# Patient Record
Sex: Male | Born: 1970 | Race: White | Hispanic: No | Marital: Married | Smoking: Former smoker
Health system: Southern US, Community
[De-identification: ages and names within clinical notes are randomized; demographics above are authoritative.]

## PROBLEM LIST (undated history)

## (undated) DIAGNOSIS — K219 Gastro-esophageal reflux disease without esophagitis: Secondary | ICD-10-CM

## (undated) DIAGNOSIS — T783XXA Angioneurotic edema, initial encounter: Secondary | ICD-10-CM

## (undated) DIAGNOSIS — M5030 Other cervical disc degeneration, unspecified cervical region: Secondary | ICD-10-CM

## (undated) DIAGNOSIS — K589 Irritable bowel syndrome without diarrhea: Secondary | ICD-10-CM

## (undated) DIAGNOSIS — J40 Bronchitis, not specified as acute or chronic: Secondary | ICD-10-CM

## (undated) DIAGNOSIS — N200 Calculus of kidney: Secondary | ICD-10-CM

## (undated) DIAGNOSIS — J302 Other seasonal allergic rhinitis: Secondary | ICD-10-CM

## (undated) DIAGNOSIS — I517 Cardiomegaly: Secondary | ICD-10-CM

## (undated) DIAGNOSIS — M4800 Spinal stenosis, site unspecified: Secondary | ICD-10-CM

## (undated) DIAGNOSIS — I10 Essential (primary) hypertension: Secondary | ICD-10-CM

## (undated) DIAGNOSIS — I219 Acute myocardial infarction, unspecified: Secondary | ICD-10-CM

## (undated) DIAGNOSIS — K760 Fatty (change of) liver, not elsewhere classified: Secondary | ICD-10-CM

## (undated) DIAGNOSIS — M109 Gout, unspecified: Secondary | ICD-10-CM

## (undated) DIAGNOSIS — E119 Type 2 diabetes mellitus without complications: Secondary | ICD-10-CM

## (undated) HISTORY — DX: Calculus of kidney: N20.0

## (undated) HISTORY — DX: Acute myocardial infarction, unspecified: I21.9

## (undated) HISTORY — DX: Other seasonal allergic rhinitis: J30.2

## (undated) HISTORY — DX: Bronchitis, not specified as acute or chronic: J40

## (undated) HISTORY — DX: Gastro-esophageal reflux disease without esophagitis: K21.9

## (undated) HISTORY — PX: OTHER SURGICAL HISTORY: SHX169

## (undated) HISTORY — DX: Cardiomegaly: I51.7

## (undated) HISTORY — DX: Essential (primary) hypertension: I10

## (undated) HISTORY — DX: Spinal stenosis, site unspecified: M48.00

## (undated) HISTORY — DX: Type 2 diabetes mellitus without complications: E11.9

## (undated) HISTORY — DX: Other cervical disc degeneration, unspecified cervical region: M50.30

## (undated) HISTORY — PX: VASECTOMY: SHX75

## (undated) HISTORY — DX: Irritable bowel syndrome, unspecified: K58.9

## (undated) HISTORY — DX: Fatty (change of) liver, not elsewhere classified: K76.0

## (undated) HISTORY — DX: Gout, unspecified: M10.9

## (undated) HISTORY — DX: Angioneurotic edema, initial encounter: T78.3XXA

---

## 2002-12-26 HISTORY — PX: NASAL SEPTUM SURGERY: SHX37

## 2005-12-26 HISTORY — PX: SHOULDER SURGERY: SHX246

## 2005-12-26 HISTORY — PX: OTHER SURGICAL HISTORY: SHX169

## 2007-07-12 ENCOUNTER — Ambulatory Visit: Payer: Self-pay | Admitting: Orthopaedic Surgery

## 2007-08-20 ENCOUNTER — Ambulatory Visit: Payer: Self-pay | Admitting: Orthopaedic Surgery

## 2007-08-20 ENCOUNTER — Other Ambulatory Visit: Payer: Self-pay

## 2007-08-23 ENCOUNTER — Ambulatory Visit: Payer: Self-pay | Admitting: Orthopaedic Surgery

## 2008-07-17 ENCOUNTER — Emergency Department: Payer: Self-pay | Admitting: Emergency Medicine

## 2008-07-17 ENCOUNTER — Other Ambulatory Visit: Payer: Self-pay

## 2009-05-10 ENCOUNTER — Ambulatory Visit: Payer: Self-pay | Admitting: Internal Medicine

## 2010-10-13 ENCOUNTER — Ambulatory Visit: Payer: Self-pay | Admitting: Family Medicine

## 2011-02-06 ENCOUNTER — Ambulatory Visit: Payer: Self-pay | Admitting: Internal Medicine

## 2014-08-04 ENCOUNTER — Ambulatory Visit: Payer: Self-pay | Admitting: Unknown Physician Specialty

## 2015-05-28 ENCOUNTER — Ambulatory Visit (INDEPENDENT_AMBULATORY_CARE_PROVIDER_SITE_OTHER): Payer: Managed Care, Other (non HMO) | Admitting: Gastroenterology

## 2015-05-28 ENCOUNTER — Encounter: Payer: Self-pay | Admitting: Gastroenterology

## 2015-05-28 ENCOUNTER — Ambulatory Visit: Payer: Self-pay | Admitting: Gastroenterology

## 2015-05-28 VITALS — BP 140/90 | HR 54 | Temp 98.4°F | Ht 71.0 in | Wt 240.0 lb

## 2015-05-28 DIAGNOSIS — M109 Gout, unspecified: Secondary | ICD-10-CM | POA: Insufficient documentation

## 2015-05-28 DIAGNOSIS — I10 Essential (primary) hypertension: Secondary | ICD-10-CM | POA: Diagnosis not present

## 2015-05-28 DIAGNOSIS — K589 Irritable bowel syndrome without diarrhea: Secondary | ICD-10-CM | POA: Insufficient documentation

## 2015-05-28 MED ORDER — PB-HYOSCY-ATROPINE-SCOPOLAMINE 16.2 MG/5ML PO ELIX: 5.0000 mL | ORAL_SOLUTION | Freq: Four times a day (QID) | ORAL | Status: DC | PRN

## 2015-05-28 NOTE — Addendum Note (Signed)
Addended by: Lucilla Lame on: 05/28/2015 11:13 AM   Modules accepted: Orders

## 2015-05-28 NOTE — Progress Notes (Signed)
Primary Care Physician:  Enid Derry, MD  Primary Gastroenterologist:  Dr. Lucilla Lame  Chief Complaint  Patient presents with  . Irritable Bowel Syndrome  . Abdominal Pain    HPI:  John Hartman is a 44 y.o. male here today with a history of irritable bowel syndrome since he is a young child.  The patient has approximately 4 episodes of pain with vomiting only recently as a associated symptom with his diarrhea.  The patient is usually constipated and only has the diarrhea when he has these explosive diarrhea episodes.  The patient had a colonoscopy probably 5 years ago and reports a mother with what sounds like to be colon polyps.  She is undergoing frequent colon Screening for colon polyps.  The patient has taken hyoscyamine and has had little relief from it.  The patient will be started on a trial of Donnatal to be taken when he has these attacks.  The patient has also been encouraged to start a daily stool softener such as MiraLAX or prune juice.  Due to his family history of colon polyps and his change in symptoms with resulting vomiting and diarrhea coming together.  The patient will be set up for a colonoscopy. Current Outpatient Prescriptions  Medication Sig Dispense Refill  . atenolol (TENORMIN) 50 MG tablet Take 50 mg by mouth daily.    . cetirizine (ZYRTEC) 10 MG tablet Take 10 mg by mouth daily.    . diphenhydrAMINE (BENADRYL) 25 mg capsule Take 25 mg by mouth 2 (two) times daily.    . ranitidine (ZANTAC) 150 MG capsule Take 150 mg by mouth daily.     No current facility-administered medications for this visit.    Allergies as of 05/28/2015 - Review Complete 05/28/2015  Allergen Reaction Noted  . Allopurinol  05/28/2015    Past Medical History  Diagnosis Date  . Hypertension   . Gout     Past Surgical History  Procedure Laterality Date  . Shoulder surgery Left 2007  . Nasal septum surgery  2004  . Acdf with fusion  2007  . Testicular torsion      Family History   Problem Relation Age of Onset  . Breast cancer Mother   . Skin cancer Mother     History   Social History  . Marital Status: Married    Spouse Name: N/A  . Number of Children: N/A  . Years of Education: N/A   Occupational History  . Not on file.   Social History Main Topics  . Smoking status: Former Smoker    Quit date: 04/15/2015  . Smokeless tobacco: Never Used  . Alcohol Use: 0.0 oz/week    0 Standard drinks or equivalent per week     Comment: Rare consumption  . Drug Use: No  . Sexual Activity: Not on file   Other Topics Concern  . Not on file   Social History Narrative  . No narrative on file      ROS:  General: Negative for anorexia, weight loss, fever, chills, fatigue, weakness. Eyes: Negative for vision changes.  ENT: Negative for hoarseness, difficulty swallowing , nasal congestion. CV: Negative for chest pain, angina, palpitations, dyspnea on exertion, peripheral edema.  Respiratory: Negative for dyspnea at rest, dyspnea on exertion, cough, sputum, wheezing.  GI: See history of present illness. GU:  Negative for dysuria, hematuria, urinary incontinence, urinary frequency, nocturnal urination.  MS: Negative for joint pain, low back pain.  Derm: Negative for rash or itching.  Neuro:  Negative for weakness, abnormal sensation, seizure, frequent headaches, memory loss, confusion.  Psych: Negative for anxiety, depression, suicidal ideation, hallucinations.  Endo: Negative for unusual weight change.  Heme: Negative for bruising or bleeding. Allergy: Negative for rash or hives.    Physical Examination:  BP 140/90 mmHg  Pulse 54  Temp(Src) 98.4 F (36.9 C) (Oral)  Ht 5\' 11"  (1.803 m)  Wt 240 lb (108.863 kg)  BMI 33.49 kg/m2   General: Well-nourished, well-developed in no acute distress.  Head: Normocephalic, atraumatic.   Eyes: Conjunctiva pink, no icterus. EOMI. Mouth: Oropharyngeal mucosa moist and pink, no lesions erythema or exudate. Neck:  Supple without thyromegaly, masses, or lymphadenopathy.  Lungs: Clear to auscultation bilaterally.  Heart: Regular rate and rhythm, no murmurs rubs or gallops.  Abdomen: Bowel sounds are normal, soft, nontender, nondistended, no hepatosplenomegaly or masses, no abdominal bruits or hernia, no rebound tenderness or guarding.  Rectal: Deferred. Extremities: No lower extremity edema. No clubbing or deformities.  Neuro: Alert and oriented x 3, grossly normal neurologically.  Skin: Warm and dry, no rash or jaundice.   Psych: Cooperative, normal mood and affect.  Imaging Studies: No results found.

## 2015-06-25 ENCOUNTER — Ambulatory Visit (INDEPENDENT_AMBULATORY_CARE_PROVIDER_SITE_OTHER): Payer: Managed Care, Other (non HMO) | Admitting: Cardiovascular Disease

## 2015-06-25 ENCOUNTER — Other Ambulatory Visit: Payer: Self-pay

## 2015-06-25 ENCOUNTER — Encounter: Payer: Self-pay | Admitting: Cardiovascular Disease

## 2015-06-25 VITALS — BP 138/102 | HR 61 | Ht 72.0 in | Wt 241.8 lb

## 2015-06-25 DIAGNOSIS — R5383 Other fatigue: Secondary | ICD-10-CM | POA: Diagnosis not present

## 2015-06-25 DIAGNOSIS — I4901 Ventricular fibrillation: Secondary | ICD-10-CM

## 2015-06-25 DIAGNOSIS — I498 Other specified cardiac arrhythmias: Secondary | ICD-10-CM

## 2015-06-25 DIAGNOSIS — R000 Tachycardia, unspecified: Secondary | ICD-10-CM

## 2015-06-25 NOTE — Progress Notes (Signed)
Cardiology Office Note   Date:  06/25/2015   ID:  John Hartman, DOB 1971-08-09, MRN [id number]  PCP:  Enid Derry, MD  Cardiologist:   Thayer Headings, MD   Chief Complaint  Patient presents with  . other    Ref by Dr. Sanda Klein for tachy/bradycardia. Pt. c/o rapid heart beats, breaking out into sweats. Meds reviewed by the patient verbally.    Problem List  1. tachypalpitations 2. Essential hypertension 3. Irritable Bowel syndrome      History of Present Illness: John Hartman is a 44 y.o. male who presents for further evaluation of episodes of bradycardia and tachycardia .  He has been measuring his HR on his phone .  His HR ranges from the 40's to 110 .  He used to do regular exercise but has not done any in 4 weeks because of these episodes.  He has mowed the lawn without any problems .  These episodes of tachycardia typically occur when he is at rest  .  He thinks the HR may be irregular but its difficult to tell . Mother has atrial fib  Has IBS but these episodes are not related to the episodes of faster HR   No associated CP.  No dyspnea.  Does feel the need to take a deep breath  has some severe episodes which are associated with lightheadedness and fatigue afterwards      Past Medical History  Diagnosis Date  . Hypertension   . Gout   . IBS (irritable bowel syndrome)   . HTN (hypertension)   . GERD (gastroesophageal reflux disease)   . Angioedema   . Seasonal allergies     Past Surgical History  Procedure Laterality Date  . Shoulder surgery Left 2007  . Nasal septum surgery  2004  . Acdf with fusion  2007  . Testicular torsion       Current Outpatient Prescriptions  Medication Sig Dispense Refill  . atenolol (TENORMIN) 50 MG tablet Take 50 mg by mouth daily.    . belladonna-PHENObarbital (DONNATAL) 16.2 MG/5ML ELIX Take 5 mLs (16.2 mg total) by mouth 4 (four) times daily as needed for cramping or pain. 600 mL 1  . cetirizine (ZYRTEC) 10 MG tablet Take  10 mg by mouth daily.    . diphenhydrAMINE (BENADRYL) 25 mg capsule Take 25 mg by mouth 2 (two) times daily.    . febuxostat (ULORIC) 40 MG tablet Take 40 mg by mouth daily.    . indomethacin (INDOCIN) 25 MG capsule Take 25 mg by mouth 2 (two) times daily with a meal.    . loratadine (CLARITIN) 10 MG tablet Take 10 mg by mouth daily.    . Melatonin 5 MG TABS Take 5 mg by mouth daily.    . ranitidine (ZANTAC) 150 MG capsule Take 150 mg by mouth daily.     No current facility-administered medications for this visit.    Allergies:   Allopurinol    Social History:  The patient  reports that he quit smoking about 2 months ago. He has never used smokeless tobacco. He reports that he drinks alcohol. He reports that he does not use illicit drugs.   Family History:  The patient's family history includes Arrhythmia in his mother; Breast cancer in his mother; Skin cancer in his mother.    ROS:  Please see the history of present illness.    Review of Systems: Constitutional:  denies fever, chills, diaphoresis, appetite change and fatigue.  HEENT:  denies photophobia, eye pain, redness, hearing loss, ear pain, congestion, sore throat, rhinorrhea, sneezing, neck pain, neck stiffness and tinnitus.  Respiratory: denies SOB, DOE, cough, chest tightness, and wheezing.  Cardiovascular: denies chest pain, palpitations and leg swelling.  Gastrointestinal: denies nausea, vomiting, abdominal pain, diarrhea, constipation, blood in stool.  Genitourinary: denies dysuria, urgency, frequency, hematuria, flank pain and difficulty urinating.  Musculoskeletal: denies  myalgias, back pain, joint swelling, arthralgias and gait problem.   Skin: denies pallor, rash and wound.  Neurological: denies dizziness, seizures, syncope, weakness, light-headedness, numbness and headaches.   Hematological: denies adenopathy, easy bruising, personal or family bleeding history.  Psychiatric/ Behavioral: denies suicidal ideation,  mood changes, confusion, nervousness, sleep disturbance and agitation.       All other systems are reviewed and negative.    PHYSICAL EXAM: VS:  BP 138/102 mmHg  Pulse 61  Ht 6' (1.829 m)  Wt 109.657 kg (241 lb 12 oz)  BMI 32.78 kg/m2 , BMI Body mass index is 32.78 kg/(m^2). GEN: Well nourished, well developed, in no acute distress HEENT: normal Neck: no JVD, carotid bruits, or masses Cardiac: RRR; no murmurs, rubs, or gallops,no edema  Respiratory:  clear to auscultation bilaterally, normal work of breathing GI: soft, nontender, nondistended, + BS MS: no deformity or atrophy Skin: warm and dry, no rash Neuro:  Strength and sensation are intact Psych: normal   EKG:  EKG is ordered today. The ekg ordered today demonstrates :  NSR at 61.  No ST or T wave changes    Recent Labs: No results found for requested labs within last 365 days.    Lipid Panel No results found for: CHOL, TRIG, HDL, CHOLHDL, VLDL, LDLCALC, LDLDIRECT    Wt Readings from Last 3 Encounters:  06/25/15 109.657 kg (241 lb 12 oz)  05/28/15 108.863 kg (240 lb)      Other studies Reviewed: Additional studies/ records that were reviewed today include: . Review of the above records demonstrates:    ASSESSMENT AND PLAN:  1.  Tachypalpatations: He presents with episodes of tachycardia palpitations lasting anywhere from several minutes to several hours. His heart rate typically goes into the 1:30 to 110 range. He has episodes that are not very severe and then at other times he has episodes that caused him to have some sweats, lightheadedness and caused him to be very fatigued. Clinically, I'm concerned that he may be having episodes of paroxysmal atrial fibrillation. His mother has a history of atrial fibrillation as well.  We'll set him up with a 30 day event monitor. He does not drink much caffeine. I have encouraged him to exercise on a regular basis. I've advised him to make sure he stays well-hydrated  and gets plenty of protein to eat.   Will see him in 6 weeks  - sooner if needed   2. Hypertension: The patient has a history of hypertension. His blood pressures typically well controlled but it is a little higher today. He's been nothing by mouth for the past day or so in preparation for colonoscopy tomorrow. I suspect this am his hypertension is related to this as well as some anxiety about coming into the cardiologist. Burnis Medin address this at a later time if needed.  Current medicines are reviewed at length with the patient today.  The patient does not have concerns regarding medicines.  The following changes have been made:  no change  Labs/ tests ordered today include:  Orders Placed This Encounter  Procedures  . EKG  12-Lead     Disposition:   FU with me in 6 weeks      Aisha Greenberger, Wonda Cheng, MD  06/25/2015 2:06 PM    [facility] [place], [place], [place]  [address] Phone: [phone]; Fax: [phone]   [facility]  [address] [place], [place]  [address] [phone]    Fax [phone]

## 2015-06-25 NOTE — Patient Instructions (Addendum)
Medication Instructions: - no changes  Labwork: - no changes  Procedures/Testing: - Your physician has recommended that you wear an event monitor. Event monitors are medical devices that record the heart's electrical activity. Doctors most often [place] these monitors to diagnose arrhythmias. Arrhythmias are problems with the speed or rhythm of the heartbeat. The monitor is a small, portable device. You can wear one while you do your normal daily activities. This is usually used to diagnose what is causing palpitations/syncope (passing out).  ** This will be mailed to your home from the company **  Follow-Up: - Your physician recommends that you schedule a follow-up appointment in: 6 weeks with Dr. Acie Fredrickson.  Any Additional Special Instructions Will Be Listed Below (If Applicable).

## 2015-06-26 ENCOUNTER — Ambulatory Visit: Payer: Managed Care, Other (non HMO) | Admitting: Anesthesiology

## 2015-06-26 ENCOUNTER — Encounter
Admission: RE | Disposition: A | Discharge status: Home / Self Care | Payer: Self-pay | Source: Ambulatory Visit | Attending: Gastroenterology

## 2015-06-26 ENCOUNTER — Other Ambulatory Visit: Payer: Self-pay | Admitting: Gastroenterology

## 2015-06-26 ENCOUNTER — Ambulatory Visit
Admission: RE | Admit: 2015-06-26 | Discharge: 2015-06-26 | Disposition: A | Discharge status: Home / Self Care | Payer: Managed Care, Other (non HMO) | Source: Ambulatory Visit | Attending: Gastroenterology | Admitting: Gastroenterology

## 2015-06-26 DIAGNOSIS — Z808 Family history of malignant neoplasm of other organs or systems: Secondary | ICD-10-CM | POA: Insufficient documentation

## 2015-06-26 DIAGNOSIS — D127 Benign neoplasm of rectosigmoid junction: Secondary | ICD-10-CM | POA: Insufficient documentation

## 2015-06-26 DIAGNOSIS — K640 First degree hemorrhoids: Secondary | ICD-10-CM | POA: Diagnosis not present

## 2015-06-26 DIAGNOSIS — Z8371 Family history of colonic polyps: Secondary | ICD-10-CM | POA: Diagnosis not present

## 2015-06-26 DIAGNOSIS — M109 Gout, unspecified: Secondary | ICD-10-CM | POA: Diagnosis not present

## 2015-06-26 DIAGNOSIS — Z79899 Other long term (current) drug therapy: Secondary | ICD-10-CM | POA: Insufficient documentation

## 2015-06-26 DIAGNOSIS — K219 Gastro-esophageal reflux disease without esophagitis: Secondary | ICD-10-CM | POA: Insufficient documentation

## 2015-06-26 DIAGNOSIS — Z9079 Acquired absence of other genital organ(s): Secondary | ICD-10-CM | POA: Insufficient documentation

## 2015-06-26 DIAGNOSIS — I10 Essential (primary) hypertension: Secondary | ICD-10-CM | POA: Diagnosis not present

## 2015-06-26 DIAGNOSIS — Z803 Family history of malignant neoplasm of breast: Secondary | ICD-10-CM | POA: Diagnosis not present

## 2015-06-26 DIAGNOSIS — Z800 Family history of malignant neoplasm of digestive organs: Secondary | ICD-10-CM | POA: Insufficient documentation

## 2015-06-26 DIAGNOSIS — K589 Irritable bowel syndrome without diarrhea: Secondary | ICD-10-CM | POA: Diagnosis not present

## 2015-06-26 DIAGNOSIS — Z888 Allergy status to other drugs, medicaments and biological substances status: Secondary | ICD-10-CM | POA: Insufficient documentation

## 2015-06-26 DIAGNOSIS — Z87891 Personal history of nicotine dependence: Secondary | ICD-10-CM | POA: Diagnosis not present

## 2015-06-26 DIAGNOSIS — Z8249 Family history of ischemic heart disease and other diseases of the circulatory system: Secondary | ICD-10-CM | POA: Insufficient documentation

## 2015-06-26 HISTORY — PX: COLONOSCOPY WITH PROPOFOL: SHX5780

## 2015-06-26 SURGERY — COLONOSCOPY WITH PROPOFOL
Anesthesia: Monitor Anesthesia Care | Wound class: Contaminated

## 2015-06-26 MED ORDER — PROPOFOL 10 MG/ML IV BOLUS
INTRAVENOUS | Status: DC | PRN
  Administered 2015-06-26 (×2): 50 mg via INTRAVENOUS
  Administered 2015-06-26: 100 mg via INTRAVENOUS

## 2015-06-26 MED ORDER — LIDOCAINE HCL (CARDIAC) 20 MG/ML IV SOLN
INTRAVENOUS | Status: DC | PRN
  Administered 2015-06-26: 40 mg via INTRAVENOUS

## 2015-06-26 MED ORDER — LACTATED RINGERS IV SOLN
INTRAVENOUS | Status: DC
  Administered 2015-06-26: 14:00:00 via INTRAVENOUS

## 2015-06-26 MED ORDER — SODIUM CHLORIDE 0.9 % IV SOLN: INTRAVENOUS | Status: DC

## 2015-06-26 MED ORDER — ACETAMINOPHEN 325 MG PO TABS: 325.0000 mg | ORAL_TABLET | ORAL | Status: DC | PRN

## 2015-06-26 MED ORDER — ACETAMINOPHEN 160 MG/5ML PO SOLN: 325.0000 mg | ORAL | Status: DC | PRN

## 2015-06-26 SURGICAL SUPPLY — 28 items
CANISTER SUCT 1200ML W/VALVE (MISCELLANEOUS) ×2 IMPLANT
FCP ESCP3.2XJMB 240X2.8X (MISCELLANEOUS)
FORCEPS BIOP RAD 4 LRG CAP 4 (CUTTING FORCEPS) ×2 IMPLANT
FORCEPS BIOP RJ4 240 W/NDL (MISCELLANEOUS)
FORCEPS ESCP3.2XJMB 240X2.8X (MISCELLANEOUS) IMPLANT
GOWN CVR UNV OPN BCK APRN NK (MISCELLANEOUS) ×2 IMPLANT
GOWN ISOL THUMB LOOP REG UNIV (MISCELLANEOUS) ×2
HEMOCLIP INSTINCT (CLIP) IMPLANT
INJECTOR VARIJECT VIN23 (MISCELLANEOUS) IMPLANT
KIT CO2 TUBING (TUBING) IMPLANT
KIT DEFENDO VALVE AND CONN (KITS) IMPLANT
KIT ENDO PROCEDURE OLY (KITS) ×2 IMPLANT
LIGATOR MULTIBAND 6SHOOTER MBL (MISCELLANEOUS) IMPLANT
MARKER SPOT ENDO TATTOO 5ML (MISCELLANEOUS) IMPLANT
PAD GROUND ADULT SPLIT (MISCELLANEOUS) IMPLANT
SNARE SHORT THROW 13M SML OVAL (MISCELLANEOUS) IMPLANT
SNARE SHORT THROW 30M LRG OVAL (MISCELLANEOUS) IMPLANT
SPOT EX ENDOSCOPIC TATTOO (MISCELLANEOUS)
SUCTION POLY TRAP 4CHAMBER (MISCELLANEOUS) IMPLANT
TRAP SUCTION POLY (MISCELLANEOUS) IMPLANT
TUBING CONN 6MMX3.1M (TUBING)
TUBING SUCTION CONN 0.25 STRL (TUBING) IMPLANT
UNDERPAD 30X60 958B10 (PK) (MISCELLANEOUS) IMPLANT
VALVE BIOPSY ENDO (VALVE) IMPLANT
VARIJECT INJECTOR VIN23 (MISCELLANEOUS)
WATER AUXILLARY (MISCELLANEOUS) IMPLANT
WATER STERILE IRR 250ML POUR (IV SOLUTION) ×2 IMPLANT
WATER STERILE IRR 500ML POUR (IV SOLUTION) IMPLANT

## 2015-06-26 NOTE — H&P (Signed)
[facility]  [address] [place], [place] [address] Phone: [phone] Fax : [phone]  Primary Care Physician:  Enid Derry, MD Primary Gastroenterologist:  Dr. Allen Norris  Pre-Procedure History & Physical: HPI:  John Hartman is a 44 y.o. male is here for an colonoscopy.   Past Medical History  Diagnosis Date  . Gout   . IBS (irritable bowel syndrome)   . Angioedema   . Seasonal allergies   . Hypertension     CONTROLLED ON MEDS  . HTN (hypertension)   . GERD (gastroesophageal reflux disease)     Past Surgical History  Procedure Laterality Date  . Shoulder surgery Left 2007  . Nasal septum surgery  2004  . Acdf with fusion  2007  . Testicular torsion    . Vasectomy      Prior to Admission medications   Medication Sig Start Date End Date Taking? Authorizing Provider  atenolol (TENORMIN) 50 MG tablet Take 50 mg by mouth daily. PM   Yes Historical Provider, MD  cetirizine (ZYRTEC) 10 MG tablet Take 10 mg by mouth daily.   Yes Historical Provider, MD  diphenhydrAMINE (BENADRYL) 25 mg capsule Take 25 mg by mouth 2 (two) times daily.   Yes Historical Provider, MD  febuxostat (ULORIC) 40 MG tablet Take 40 mg by mouth daily.   Yes Historical Provider, MD  indomethacin (INDOCIN) 25 MG capsule Take 25 mg by mouth 2 (two) times daily with a meal.   Yes Historical Provider, MD  Melatonin 5 MG TABS Take 5 mg by mouth at bedtime.    Yes Historical Provider, MD  ranitidine (ZANTAC) 150 MG capsule Take 150 mg by mouth daily.   Yes Historical Provider, MD  belladonna-PHENObarbital (DONNATAL) 16.2 MG/5ML ELIX Take 5 mLs (16.2 mg total) by mouth 4 (four) times daily as needed for cramping or pain. Patient not taking: Reported on 06/26/2015 05/28/15   Lucilla Lame, MD  loratadine (CLARITIN) 10 MG tablet Take 10 mg by mouth daily.    Historical Provider, MD    Allergies as of 06/25/2015 - Review Complete 06/25/2015  Allergen Reaction Noted  . Allopurinol  05/28/2015     Family History  Problem Relation Age of Onset  . Breast cancer Mother   . Skin cancer Mother   . Arrhythmia Mother     A-fib    History   Social History  . Marital Status: Married    Spouse Name: N/A  . Number of Children: N/A  . Years of Education: N/A   Occupational History  . Not on file.   Social History Main Topics  . Smoking status: Former Smoker -- 1.00 packs/day for 10 years    Types: Cigarettes    Quit date: 04/15/2015  . Smokeless tobacco: Never Used  . Alcohol Use: No     Comment: Rare consumption  . Drug Use: No  . Sexual Activity: Not on file   Other Topics Concern  . Not on file   Social History Narrative    Review of Systems: See HPI, otherwise negative ROS  Physical Exam: BP 144/89 mmHg  Pulse 67  Temp(Src) 99.3 F (37.4 C)  Resp 18  Ht 6' (1.829 m)  Wt 235 lb (106.595 kg)  BMI 31.86 kg/m2 General:   Alert,  pleasant and cooperative in NAD Head:  Normocephalic and atraumatic. Neck:  Supple; no masses or thyromegaly. Lungs:  Clear throughout to auscultation.    Heart:  Regular rate and rhythm. Abdomen:  Soft,  nontender and nondistended. Normal bowel sounds, without guarding, and without rebound.   Neurologic:  Alert and  oriented x4;  grossly normal neurologically.  Impression/Plan: Deckard Stuber is here for an colonoscopy to be performed for change in bowel habit and family history of colon polyps.  Risks, benefits, limitations, and alternatives regarding  colonoscopy have been reviewed with the patient.  Questions have been answered.  All parties agreeable.   Ollen Bowl, MD  06/26/2015, 2:08 PM

## 2015-06-26 NOTE — Discharge Instructions (Signed)

## 2015-06-26 NOTE — Anesthesia Procedure Notes (Signed)
Procedure Name: MAC Performed by: Nysia Dell Pre-anesthesia Checklist: Patient identified, Emergency Drugs available, Suction available, Timeout performed and Patient being monitored Patient Re-evaluated:Patient Re-evaluated prior to inductionOxygen Delivery Method: Nasal cannula Placement Confirmation: positive ETCO2     

## 2015-06-26 NOTE — Anesthesia Preprocedure Evaluation (Signed)
Anesthesia Evaluation  Patient identified by MRN, date of birth, ID band  Reviewed: Allergy & Precautions, H&P , NPO status , Patient's Chart, lab work & pertinent test results  Airway Mallampati: II  TM Distance: >3 FB Neck ROM: full    Dental no notable dental hx.    Pulmonary former smoker,    Pulmonary exam normal        Cardiovascular hypertension,  Rhythm:regular Rate:Normal     Neuro/Psych    GI/Hepatic GERD  ,  Endo/Other    Renal/GU      Musculoskeletal   Abdominal   Peds  Hematology   Anesthesia Other Findings   Reproductive/Obstetrics                             Anesthesia Physical Anesthesia Plan  ASA: II  Anesthesia Plan: MAC   Post-op Pain Management:    Induction:   Airway Management Planned:   Additional Equipment:   Intra-op Plan:   Post-operative Plan:   Informed Consent: I have reviewed the patients History and Physical, chart, labs and discussed the procedure including the risks, benefits and alternatives for the proposed anesthesia with the patient or authorized representative who has indicated his/her understanding and acceptance.     Plan Discussed with: CRNA  Anesthesia Plan Comments:         Anesthesia Quick Evaluation  

## 2015-06-26 NOTE — Transfer of Care (Signed)
Immediate Anesthesia Transfer of Care Note  Patient: John Hartman  Procedure(s) Performed: Procedure(s) with comments: COLONOSCOPY WITH PROPOFOL (N/A) - with biopsies  Patient Location: PACU  Anesthesia Type: MAC  Level of Consciousness: awake, alert  and patient cooperative  Airway and Oxygen Therapy: Patient Spontanous Breathing and Patient connected to supplemental oxygen  Post-op Assessment: Post-op Vital signs reviewed, Patient's Cardiovascular Status Stable, Respiratory Function Stable, Patent Airway and No signs of Nausea or vomiting  Post-op Vital Signs: Reviewed and stable  Complications: No apparent anesthesia complications

## 2015-06-26 NOTE — Anesthesia Postprocedure Evaluation (Signed)
  Anesthesia Post-op Note  Patient: John Hartman  Procedure(s) Performed: Procedure(s) with comments: COLONOSCOPY WITH PROPOFOL (N/A) - with biopsies  Anesthesia type:MAC  Patient location: PACU  Post pain: Pain level controlled  Post assessment: Post-op Vital signs reviewed, Patient's Cardiovascular Status Stable, Respiratory Function Stable, Patent Airway and No signs of Nausea or vomiting  Post vital signs: Reviewed and stable  Last Vitals:  Filed Vitals:   06/26/15 1440  BP: 110/74  Pulse: 75  Temp:   Resp: 22    Level of consciousness: awake, alert  and patient cooperative  Complications: No apparent anesthesia complications

## 2015-06-26 NOTE — Op Note (Signed)
[facility] Patient Name: John Hartman Procedure Date: 06/26/2015 2:04 PM MRN: [id number] Account #: [id number] Date of Birth: Jan 25, 1971 Admit Type: Outpatient Age: 44 Room: [facility] OR ROOM 01 Gender: Male Note Status: Finalized Procedure:         Colonoscopy Indications:       Family history of colon cancer in a first-degree relative Providers:         Lucilla Lame, MD Medicines:         Propofol per Anesthesia Complications:     No immediate complications. Procedure:         Pre-Anesthesia Assessment:                    - Prior to the procedure, a History and Physical was                     performed, and patient medications and allergies were                     reviewed. The patient's tolerance of previous anesthesia                     was also reviewed. The risks and benefits of the procedure                     and the sedation options and risks were discussed with the                     patient. All questions were answered, and informed consent                     was obtained. Prior Anticoagulants: The patient has taken                     no previous anticoagulant or antiplatelet agents. ASA                     Grade Assessment: II - A patient with mild systemic                     disease. After reviewing the risks and benefits, the                     patient was deemed in satisfactory condition to undergo                     the procedure.                    After obtaining informed consent, the colonoscope was                     passed under direct vision. Throughout the procedure, the                     patient's blood pressure, pulse, and oxygen saturations                     were monitored continuously. The Olympus CF-HQ190L                     Colonoscope (S#. [id number]) was introduced through the anus                     and advanced to the the  cecum, identified by appendiceal                     orifice and ileocecal valve. The  colonoscopy was performed                     without difficulty. The patient tolerated the procedure                     well. The quality of the bowel preparation was excellent. Findings:      The perianal and digital rectal examinations were normal.      Three sessile polyps were found in the recto-sigmoid colon. The polyps       were 2 to 4 mm in size. These polyps were removed with a cold biopsy       forceps. Resection and retrieval were complete.      Non-bleeding internal hemorrhoids were found during retroflexion. The       hemorrhoids were Grade I (internal hemorrhoids that do not prolapse). Impression:        - Three 2 to 4 mm polyps at the recto-sigmoid colon.                     Resected and retrieved.                    - Non-bleeding internal hemorrhoids. Recommendation:    - Repeat colonoscopy in 5 years for surveillance. Procedure Code(s): --- Professional ---                    [phone], Colonoscopy, flexible; with biopsy, single or                     multiple Diagnosis Code(s): --- Professional ---                    Z80.0, Family history of malignant neoplasm of digestive                     organs                    D12.7, Benign neoplasm of rectosigmoid junction CPT copyright 2014 American Medical Association. All rights reserved. The codes documented in this report are preliminary and upon coder review may  be revised to meet current compliance requirements. Lucilla Lame, MD 06/26/2015 2:24:40 PM This report has been signed electronically. Number of Addenda: 0 Note Initiated On: 06/26/2015 2:04 PM Scope Withdrawal Time: 0 hours 7 minutes 21 seconds  Total Procedure Duration: 0 hours 8 minutes 41 seconds       [facility]

## 2015-06-30 ENCOUNTER — Encounter: Payer: Self-pay | Admitting: Gastroenterology

## 2015-07-01 DIAGNOSIS — D127 Benign neoplasm of rectosigmoid junction: Secondary | ICD-10-CM | POA: Insufficient documentation

## 2015-07-01 DIAGNOSIS — Z800 Family history of malignant neoplasm of digestive organs: Secondary | ICD-10-CM | POA: Insufficient documentation

## 2015-07-02 ENCOUNTER — Encounter: Payer: Self-pay | Admitting: Gastroenterology

## 2015-07-02 ENCOUNTER — Ambulatory Visit (INDEPENDENT_AMBULATORY_CARE_PROVIDER_SITE_OTHER): Payer: Managed Care, Other (non HMO)

## 2015-07-02 DIAGNOSIS — I4901 Ventricular fibrillation: Secondary | ICD-10-CM

## 2015-07-02 DIAGNOSIS — R000 Tachycardia, unspecified: Secondary | ICD-10-CM | POA: Diagnosis not present

## 2015-07-23 ENCOUNTER — Telehealth: Payer: Self-pay | Admitting: *Deleted

## 2015-07-23 NOTE — Telephone Encounter (Signed)
Pt wanted to make you aware that he has stopped wearing the monitor.  He does not want to send it back until he knows that you are ok w/ that.

## 2015-07-23 NOTE — Telephone Encounter (Signed)
Pt is asking since he broke out from the monitor leads that can he stop wearing it.  He only has one week left.  He also states that a Supplement that he was taking called herbalife  He did not mention it when he came to see [place] but ever since he's stopped that  he's  Had nothing has happened. He thinks it was this.   But not sure.  Please advise. He also stated he will not be wearing it for the next few days but will await our call.

## 2015-07-24 NOTE — Telephone Encounter (Signed)
Pt calling back to find out status of him stopping the holter monitor. Please call and advise

## 2015-07-24 NOTE — Telephone Encounter (Signed)
This encounter was created in error - please disregard.

## 2015-07-27 NOTE — Telephone Encounter (Signed)
Left message regarding Dr. Elmarie Shiley recommendations with CB number if any further questions.

## 2015-07-27 NOTE — Telephone Encounter (Signed)
OK to DC he monitor early since he is apparently allergic to the adhesive in the leads. I'm not sure what's in Herbalife but if the palpitations have resolved after he stopped it, I would agree with him that it probably was contributing .

## 2015-08-12 ENCOUNTER — Encounter: Payer: Self-pay | Admitting: Cardiovascular Disease

## 2015-08-12 ENCOUNTER — Ambulatory Visit (INDEPENDENT_AMBULATORY_CARE_PROVIDER_SITE_OTHER): Payer: Managed Care, Other (non HMO) | Admitting: Cardiovascular Disease

## 2015-08-12 VITALS — BP 116/80 | HR 67 | Ht 72.0 in | Wt 244.5 lb

## 2015-08-12 DIAGNOSIS — R002 Palpitations: Secondary | ICD-10-CM | POA: Diagnosis not present

## 2015-08-12 DIAGNOSIS — I10 Essential (primary) hypertension: Secondary | ICD-10-CM

## 2015-08-12 NOTE — Patient Instructions (Signed)
Medication Instructions: None  Labwork: None  Testing/Procedures: none  Follow-Up with Dr. Acie Fredrickson as needed.  Any Other Special Instructions Will Be Listed Below (If Applicable).

## 2015-08-12 NOTE — Progress Notes (Signed)
Cardiology Office Note   Date:  08/12/2015   ID:  John Hartman, DOB [birth year], MRN [id number]  PCP:  Enid Derry, MD  Cardiologist:   Thayer Headings, MD   Chief Complaint  Patient presents with  . other    F/u ecardio no complaints. Meds reviewed verbally with pt.   Problem List  1. tachypalpitations 2. Essential hypertension 3. Irritable Bowel syndrome      History of Present Illness: John Hartman is a 44 y.o. male who presents for further evaluation of episodes of bradycardia and tachycardia .  He has been measuring his HR on his phone .  His HR ranges from the 40's to 110 .  He used to do regular exercise but has not done any in 4 weeks because of these episodes.  He has mowed the lawn without any problems .  These episodes of tachycardia typically occur when he is at rest  .  He thinks the HR may be irregular but its difficult to tell . Mother has atrial fib  Has IBS but these episodes are not related to the episodes of faster HR   No associated CP.  No dyspnea.  Does feel the need to take a deep breath  has some severe episodes which are associated with lightheadedness and fatigue afterwards   August 12, 2015:  John Hartman presents today for follow-up of his recent palpitations. He had a 30 day event monitor which revealed no significant arrhythmias. He now thinks it was the [employer] shakes that were causing his palpitations. He has stopped going to [employer] and the palpitations have resolved.  He developed a severe rash with the electrodes.    Past Medical History  Diagnosis Date  . Gout   . IBS (irritable bowel syndrome)   . Angioedema   . Seasonal allergies   . Hypertension     CONTROLLED ON MEDS  . HTN (hypertension)   . GERD (gastroesophageal reflux disease)     Past Surgical History  Procedure Laterality Date  . Shoulder surgery Left 2007  . Nasal septum surgery  2004  . Acdf with fusion  2007  . Testicular torsion    . Vasectomy    . Colonoscopy  with propofol N/A 06/26/2015    Procedure: COLONOSCOPY WITH PROPOFOL;  Surgeon: Lucilla Lame, MD;  Location: [facility];  Service: Endoscopy;  Laterality: N/A;  with biopsies     Current Outpatient Prescriptions  Medication Sig Dispense Refill  . atenolol (TENORMIN) 50 MG tablet Take 50 mg by mouth daily. PM    . belladonna-PHENObarbital (DONNATAL) 16.2 MG/5ML ELIX Take 5 mLs (16.2 mg total) by mouth 4 (four) times daily as needed for cramping or pain. 600 mL 1  . cetirizine (ZYRTEC) 10 MG tablet Take 10 mg by mouth daily.    . diphenhydrAMINE (BENADRYL) 25 mg capsule Take 25 mg by mouth 2 (two) times daily.    . febuxostat (ULORIC) 40 MG tablet Take 40 mg by mouth daily.    . indomethacin (INDOCIN) 25 MG capsule Take 25 mg by mouth as needed.     . Melatonin 5 MG TABS Take 5 mg by mouth at bedtime.     . ranitidine (ZANTAC) 150 MG capsule Take 150 mg by mouth daily.     No current facility-administered medications for this visit.    Allergies:   Allopurinol    Social History:  The patient  reports that he quit smoking about 3 months ago. His smoking  use included Cigarettes. He has a 10 pack-year smoking history. He has never used smokeless tobacco. He reports that he does not drink alcohol or use illicit drugs.   Family History:  The patient's family history includes Arrhythmia in his mother; Breast cancer in his mother; Skin cancer in his mother.    ROS:  Please see the history of present illness.    Review of Systems: Constitutional:  denies fever, chills, diaphoresis, appetite change and fatigue.  HEENT: denies photophobia, eye pain, redness, hearing loss, ear pain, congestion, sore throat, rhinorrhea, sneezing, neck pain, neck stiffness and tinnitus.  Respiratory: denies SOB, DOE, cough, chest tightness, and wheezing.  Cardiovascular: denies chest pain, palpitations and leg swelling.  Gastrointestinal: denies nausea, vomiting, abdominal pain, diarrhea, constipation,  blood in stool.  Genitourinary: denies dysuria, urgency, frequency, hematuria, flank pain and difficulty urinating.  Musculoskeletal: denies  myalgias, back pain, joint swelling, arthralgias and gait problem.   Skin: denies pallor, rash and wound.  Neurological: denies dizziness, seizures, syncope, weakness, light-headedness, numbness and headaches.   Hematological: denies adenopathy, easy bruising, personal or family bleeding history.  Psychiatric/ Behavioral: denies suicidal ideation, mood changes, confusion, nervousness, sleep disturbance and agitation.       All other systems are reviewed and negative.    PHYSICAL EXAM: VS:  BP 116/80 mmHg  Pulse 67  Ht 6' (1.829 m)  Wt 110.904 kg (244 lb 8 oz)  BMI 33.15 kg/m2 , BMI Body mass index is 33.15 kg/(m^2). GEN: Well nourished, well developed, in no acute distress HEENT: normal Neck: no JVD, carotid bruits, or masses Cardiac: RRR; no murmurs, rubs, or gallops,no edema  Respiratory:  clear to auscultation bilaterally, normal work of breathing GI: soft, nontender, nondistended, + BS MS: no deformity or atrophy Skin: warm and dry, no rash Neuro:  Strength and sensation are intact Psych: normal   EKG:  EKG is ordered today. The ekg ordered today demonstrates :  NSR at 61.  No ST or T wave changes    Recent Labs: No results found for requested labs within last 365 days.    Lipid Panel No results found for: CHOL, TRIG, HDL, CHOLHDL, VLDL, LDLCALC, LDLDIRECT    Wt Readings from Last 3 Encounters:  08/12/15 110.904 kg (244 lb 8 oz)  06/26/15 106.595 kg (235 lb)  06/25/15 109.657 kg (241 lb 12 oz)      Other studies Reviewed: Additional studies/ records that were reviewed today include: . Review of the above records demonstrates:    ASSESSMENT AND PLAN:  1.  Tachypalpatations: He had no palpitations since I last saw him He stopped drinking the [employer] shakes and he is feeling better  No significant arrhythmias  seen on Event monitor  I've reassured him that these arrhythmias are probably benign premature ventricular contractions.  2. Hypertension: The patient has a history of hypertension.   BP is normal .    Current medicines are reviewed at length with the patient today.  The patient does not have concerns regarding medicines.  The following changes have been made:  no change  Labs/ tests ordered today include:  No orders of the defined types were placed in this encounter.     Disposition:   FU with his medical doctor.  He may see me as needed      Nastasia Kage, Wonda Cheng, MD  08/12/2015 3:22 PM    [facility] [address], [place], [place]  [address] Phone: [phone]; Fax: [phone]  [place]  [address] [place], [place]  [address] [phone]    Fax [phone]

## 2015-10-08 ENCOUNTER — Encounter: Payer: Self-pay | Admitting: Family Medicine

## 2015-10-08 ENCOUNTER — Ambulatory Visit (INDEPENDENT_AMBULATORY_CARE_PROVIDER_SITE_OTHER): Payer: Managed Care, Other (non HMO) | Admitting: Family Medicine

## 2015-10-08 VITALS — BP 144/95 | HR 69 | Temp 97.8°F | Ht 70.0 in | Wt 242.0 lb

## 2015-10-08 DIAGNOSIS — F558 Abuse of other non-psychoactive substances: Secondary | ICD-10-CM | POA: Diagnosis not present

## 2015-10-08 DIAGNOSIS — I10 Essential (primary) hypertension: Secondary | ICD-10-CM | POA: Insufficient documentation

## 2015-10-08 DIAGNOSIS — M109 Gout, unspecified: Secondary | ICD-10-CM | POA: Diagnosis not present

## 2015-10-08 DIAGNOSIS — J340 Abscess, furuncle and carbuncle of nose: Secondary | ICD-10-CM | POA: Diagnosis not present

## 2015-10-08 MED ORDER — AMOXICILLIN 500 MG PO CAPS: 500.0000 mg | ORAL_CAPSULE | Freq: Three times a day (TID) | ORAL | Status: DC

## 2015-10-08 NOTE — Assessment & Plan Note (Signed)
Not to goal; encouraged patient to try DASH guidelines; monitor BP and call if not controlled

## 2015-10-08 NOTE — Patient Instructions (Addendum)
Start the new antibiotics Use warm compresses over the right side of the nose/cheek for 15-20 minutes at a time, 3-4 times a day; use cloth or napkin between warmth and your skin to avoid thermal injury If the infection spreads, please see Dr. Kathyrn Sheriff or go to the emergency department right away; worsening infection would include fever, redness and swelling on the side of the face, swelling or redness around the eye, etc.  Your goal blood pressure is less than 140 mmHg on top and the bottom number less than 90 mmHg Try to follow the DASH guidelines (DASH stands for Dietary Approaches to Stop Hypertension) Try to limit the sodium in your diet.  Ideally, consume less than 1.5 grams (less than 1,500mg ) per day. Do not add salt when cooking or at the table.  Check the sodium amount on labels when shopping, and choose items lower in sodium when given a choice. Avoid or limit foods that already contain a lot of sodium. Eat a diet rich in fruits and vegetables and whole grains.  DASH Eating Plan DASH stands for "Dietary Approaches to Stop Hypertension." The DASH eating plan is a healthy eating plan that has been shown to reduce high blood pressure (hypertension). Additional health benefits may include reducing the risk of type 2 diabetes mellitus, heart disease, and stroke. The DASH eating plan may also help with weight loss. WHAT DO I NEED TO KNOW ABOUT THE DASH EATING PLAN? For the DASH eating plan, you will follow these general guidelines:  Choose foods with a percent daily value for sodium of less than 5% (as listed on the food label).  Use salt-free seasonings or herbs instead of table salt or sea salt.  Check with your health care provider or pharmacist before using salt substitutes.  Eat lower-sodium products, often labeled as "lower sodium" or "no salt added."  Eat fresh foods.  Eat more vegetables, fruits, and low-fat dairy products.  Choose whole grains. Look for the word "whole" as the  first word in the ingredient list.  Choose fish and skinless chicken or [place] more often than red meat. Limit fish, poultry, and meat to 6 oz (170 g) each day.  Limit sweets, desserts, sugars, and sugary drinks.  Choose heart-healthy fats.  Limit cheese to 1 oz (28 g) per day.  Eat more home-cooked food and less restaurant, buffet, and fast food.  Limit fried foods.  Cook foods using methods other than frying.  Limit canned vegetables. If you do use them, rinse them well to decrease the sodium.  When eating at a restaurant, ask that your food be prepared with less salt, or no salt if possible. WHAT FOODS CAN I EAT? Seek help from a dietitian for individual calorie needs. Grains Whole grain or whole wheat bread. Brown rice. Whole grain or whole wheat pasta. Quinoa, bulgur, and whole grain cereals. Low-sodium cereals. Corn or whole wheat flour tortillas. Whole grain cornbread. Whole grain crackers. Low-sodium crackers. Vegetables Fresh or frozen vegetables (raw, steamed, roasted, or grilled). Low-sodium or reduced-sodium tomato and vegetable juices. Low-sodium or reduced-sodium tomato sauce and paste. Low-sodium or reduced-sodium canned vegetables.  Fruits All fresh, canned (in natural juice), or frozen fruits. Meat and Other Protein Products Ground beef (85% or leaner), grass-fed beef, or beef trimmed of fat. Skinless chicken or [place]. Ground chicken or [place]. Pork trimmed of fat. All fish and seafood. Eggs. Dried beans, peas, or lentils. Unsalted nuts and seeds. Unsalted canned beans. Dairy Low-fat dairy products, such as skim  or 1% milk, 2% or reduced-fat cheeses, low-fat ricotta or cottage cheese, or plain low-fat yogurt. Low-sodium or reduced-sodium cheeses. Fats and Oils Tub margarines without trans fats. Light or reduced-fat mayonnaise and salad dressings (reduced sodium). Avocado. Safflower, olive, or canola oils. Natural peanut or almond butter. Other Unsalted popcorn and  pretzels. The items listed above may not be a complete list of recommended foods or beverages. Contact your dietitian for more options. WHAT FOODS ARE NOT RECOMMENDED? Grains White bread. White pasta. White rice. Refined cornbread. Bagels and croissants. Crackers that contain trans fat. Vegetables Creamed or fried vegetables. Vegetables in a cheese sauce. Regular canned vegetables. Regular canned tomato sauce and paste. Regular tomato and vegetable juices. Fruits Dried fruits. Canned fruit in light or heavy syrup. Fruit juice. Meat and Other Protein Products Fatty cuts of meat. Ribs, chicken wings, bacon, sausage, bologna, salami, chitterlings, fatback, hot dogs, bratwurst, and packaged luncheon meats. Salted nuts and seeds. Canned beans with salt. Dairy Whole or 2% milk, cream, half-and-half, and cream cheese. Whole-fat or sweetened yogurt. Full-fat cheeses or blue cheese. Nondairy creamers and whipped toppings. Processed cheese, cheese spreads, or cheese curds. Condiments Onion and garlic salt, seasoned salt, table salt, and sea salt. Canned and packaged gravies. Worcestershire sauce. Tartar sauce. Barbecue sauce. Teriyaki sauce. Soy sauce, including reduced sodium. Steak sauce. Fish sauce. Oyster sauce. Cocktail sauce. Horseradish. Ketchup and mustard. Meat flavorings and tenderizers. Bouillon cubes. Hot sauce. Tabasco sauce. Marinades. Taco seasonings. Relishes. Fats and Oils Butter, stick margarine, lard, shortening, ghee, and bacon fat. Coconut, palm kernel, or palm oils. Regular salad dressings. Other Pickles and olives. Salted popcorn and pretzels. The items listed above may not be a complete list of foods and beverages to avoid. Contact your dietitian for more information. WHERE CAN I FIND MORE INFORMATION? National Heart, Lung, and Blood Institute: travelstabloid.com   This information is not intended to replace advice given to you by your health care  provider. Make sure you discuss any questions you have with your health care provider.   Document Released: 12/01/2011 Document Revised: 01/02/2015 Document Reviewed: 10/16/2013 Elsevier Interactive Patient Education [employer].

## 2015-10-08 NOTE — Assessment & Plan Note (Signed)
Much improved per patient; uric acid down under 6 per last check (his report)

## 2015-10-08 NOTE — Progress Notes (Signed)
BP 144/95 mmHg  Pulse 69  Temp(Src) 97.8 F (36.6 C)  Ht 5\' 10"  (1.778 m)  Wt 242 lb (109.77 kg)  BMI 34.72 kg/m2  SpO2 99%   Subjective:    Patient ID: John Hartman, male    DOB: 07/26/1971, 44 y.o.   MRN: [id number]  HPI: John Hartman is a 44 y.o. male  Chief Complaint  Patient presents with  . Facial Pain    along his nose, states it feels like there is a bump/soreness inside of his nose x 2 days. Feels like it's swollen on the inside.   Patient is here for evaluation of a sore in his nose On Tuesday, he woke up with some soreness on the right side of the nose; sometimes he gets big zits; it doesn't feel superficial, it feels like it is swollen on the inside; hurts and it's really sore; never had this before; has had a handful of sinus infections in his life; has used nasal sprays for years, the afrin, and has been using that a lot lately; he has used prednisone in the past to "kick it"; he uses it all the time; soreness is 2 days ago; no fevers; feels fine otherwise; uncharacteristically tired though, not like him; going on for 3-4 days; really tired, but they have been moving; early bedtime; moved last week to a new home; settling in; sleeping okay; has a new set of bedroom furniture; new bed two years ago and this new furniture helps him sleep better No polyuria, no polydipsia; nothing excessive No blood in stool in urine No night sweats; no recent travel, no known recent tick or mosquito bites Has been using the afrin for 20 years and he's tried to kick it 6-7 times; the prednisone might help for a while No swollen glands in the neck, no sore throat; no ear symptoms; just a few headaches; little bit of fuzziness on the right eye  His blood pressure is usually well-controlled His gout is doing much better, uric acid   Relevant past medical, surgical, family and social history reviewed and updated as indicated. Interim medical history since our last visit reviewed. Allergies and  medications reviewed and updated.  Review of Systems  Per HPI unless specifically indicated above     Objective:    BP 144/95 mmHg  Pulse 69  Temp(Src) 97.8 F (36.6 C)  Ht 5\' 10"  (1.778 m)  Wt 242 lb (109.77 kg)  BMI 34.72 kg/m2  SpO2 99%  Wt Readings from Last 3 Encounters:  10/08/15 242 lb (109.77 kg)  05/12/15 235 lb (106.595 kg)  08/12/15 244 lb 8 oz (110.904 kg)    Physical Exam  Constitutional: He appears well-developed and well-nourished. No distress.  HENT:  Head: Normocephalic and atraumatic.  Right Ear: Hearing, tympanic membrane, external ear and ear canal normal.  Left Ear: Hearing, tympanic membrane, external ear and ear canal normal.  Nose: Mucosal edema present. No rhinorrhea.  On the upper outer wall of the right nostril, there is mild swelling pushing inward into the nasal passage suggestive of localized infection; no drainage; airway patent  Eyes: EOM are normal. Right eye exhibits no discharge. Left eye exhibits no discharge. Right conjunctiva is not injected. Left conjunctiva is not injected. No scleral icterus.  Symmetric creases, no swelling  Cardiovascular: Normal rate and regular rhythm.   Pulmonary/Chest: Effort normal and breath sounds normal.  Lymphadenopathy:    He has no cervical adenopathy.  Skin: He is not diaphoretic.  Very mild erythema on the outside of the nose, right side; no extension of erythema long the cheek or up toward the eye  Psychiatric: He has a normal mood and affect.   No results found for this or any previous visit.    Assessment & Plan:   Problem List Items Addressed This Visit      Cardiovascular and Mediastinum   Essential hypertension    Not to goal; encouraged patient to try DASH guidelines; monitor BP and call if not controlled        Other   Gout    Much improved per patient; uric acid down under 6 per last check (his report)      Decongestant abuse    Advised patient against use of decongestant; he is  aware of risk, has had to see ENT to get off this in the past; advised risk of ongoing decongestant use in face of infection; antibiotic will be in the blood stream and closing down or constricting blood flow to the area will decrease delivery of antibiotic where it is needed; he may call Dr. Kathyrn Sheriff (ENT) if needed       Other Visit Diagnoses    Cellulitis of sidewall of nose    -  Primary    antibiotics, warm cloth topically; avoid ice to the area; discussed risk of spread along tissue lines, reasons to seek medical care, etc.       Follow up plan: Return if symptoms worsen or fail to improve. An after-visit summary was printed and given to the patient at [facility].  Please see the patient instructions which may contain other information and recommendations beyond what is mentioned above in the assessment and plan.

## 2015-10-10 DIAGNOSIS — F558 Abuse of other non-psychoactive substances: Secondary | ICD-10-CM | POA: Insufficient documentation

## 2015-10-10 NOTE — Assessment & Plan Note (Signed)
Advised patient against use of decongestant; he is aware of risk, has had to see ENT to get off this in the past; advised risk of ongoing decongestant use in face of infection; antibiotic will be in the blood stream and closing down or constricting blood flow to the area will decrease delivery of antibiotic where it is needed; he may call Dr. Kathyrn Sheriff (ENT) if needed

## 2015-10-26 ENCOUNTER — Telehealth: Payer: Self-pay | Admitting: Family Medicine

## 2015-10-26 MED ORDER — CYCLOBENZAPRINE HCL 10 MG PO TABS: 10.0000 mg | ORAL_TABLET | Freq: Three times a day (TID) | ORAL | Status: DC | PRN

## 2015-10-26 NOTE — Telephone Encounter (Signed)
Pt is having spasms in back and says he was told to call in and he could get some sort of muscle relaxer to help them. He would like it to go to [facility]

## 2015-10-26 NOTE — Telephone Encounter (Signed)
Routing to provider  

## 2015-10-26 NOTE — Telephone Encounter (Signed)
Patient notified

## 2015-10-26 NOTE — Telephone Encounter (Signed)
Okay for medicine; Rx sent

## 2015-12-21 ENCOUNTER — Other Ambulatory Visit: Payer: Self-pay | Admitting: Family Medicine

## 2015-12-21 NOTE — Telephone Encounter (Signed)
rx approved

## 2016-03-21 ENCOUNTER — Ambulatory Visit
Admission: EM | Admit: 2016-03-21 | Discharge: 2016-03-21 | Disposition: A | Discharge status: Home / Self Care | Payer: Managed Care, Other (non HMO) | Attending: Emergency Medicine | Admitting: Emergency Medicine

## 2016-03-21 ENCOUNTER — Ambulatory Visit (INDEPENDENT_AMBULATORY_CARE_PROVIDER_SITE_OTHER): Payer: Managed Care, Other (non HMO)

## 2016-03-21 ENCOUNTER — Encounter: Payer: Self-pay | Admitting: Emergency Medicine

## 2016-03-21 DIAGNOSIS — M5136 Other intervertebral disc degeneration, lumbar region: Secondary | ICD-10-CM

## 2016-03-21 DIAGNOSIS — S39012A Strain of muscle, fascia and tendon of lower back, initial encounter: Secondary | ICD-10-CM | POA: Diagnosis not present

## 2016-03-21 MED ORDER — DIAZEPAM 2 MG PO TABS: 2.0000 mg | ORAL_TABLET | Freq: Three times a day (TID) | ORAL | Status: DC

## 2016-03-21 MED ORDER — KETOROLAC TROMETHAMINE 60 MG/2ML IM SOLN
60.0000 mg | Freq: Once | INTRAMUSCULAR | Status: AC
  Administered 2016-03-21: 60 mg via INTRAMUSCULAR

## 2016-03-21 MED ORDER — METAXALONE 800 MG PO TABS: 800.0000 mg | ORAL_TABLET | Freq: Three times a day (TID) | ORAL | Status: DC

## 2016-03-21 MED ORDER — NAPROXEN 500 MG PO TABS: 500.0000 mg | ORAL_TABLET | Freq: Two times a day (BID) | ORAL | Status: DC

## 2016-03-21 NOTE — Discharge Instructions (Signed)
Back Exercises The following exercises strengthen the muscles that help to support the back. They also help to keep the lower back flexible. Doing these exercises can help to prevent back pain or lessen existing pain. If you have back pain or discomfort, try doing these exercises 2-3 times each day or as told by your health care provider. When the pain goes away, do them once each day, but increase the number of times that you repeat the steps for each exercise (do more repetitions). If you do not have back pain or discomfort, do these exercises once each day or as told by your health care provider. EXERCISES Single Knee to Chest Repeat these steps 3-5 times for each leg:  Lie on your back on a firm bed or the floor with your legs extended.  Bring one knee to your chest. Your other leg should stay extended and in contact with the floor.  Hold your knee in place by grabbing your knee or thigh.  Pull on your knee until you feel a gentle stretch in your lower back.  Hold the stretch for 10-30 seconds.  Slowly release and straighten your leg. Pelvic Tilt Repeat these steps 5-10 times:  Lie on your back on a firm bed or the floor with your legs extended.  Bend your knees so they are pointing toward the ceiling and your feet are flat on the floor.  Tighten your lower abdominal muscles to press your lower back against the floor. This motion will tilt your pelvis so your tailbone points up toward the ceiling instead of pointing to your feet or the floor.  With gentle tension and even breathing, hold this position for 5-10 seconds. Cat-Cow Repeat these steps until your lower back becomes more flexible:  Get into a hands-and-knees position on a firm surface. Keep your hands under your shoulders, and keep your knees under your hips. You may place padding under your knees for comfort.  Let your head hang down, and point your tailbone toward the floor so your lower back becomes rounded like the  back of a cat.  Hold this position for 5 seconds.  Slowly lift your head and point your tailbone up toward the ceiling so your back forms a sagging arch like the back of a cow.  Hold this position for 5 seconds. Press-Ups Repeat these steps 5-10 times:  Lie on your abdomen (face-down) on the floor.  Place your palms near your head, about shoulder-width apart.  While you keep your back as relaxed as possible and keep your hips on the floor, slowly straighten your arms to raise the top half of your body and lift your shoulders. Do not use your back muscles to raise your upper torso. You may adjust the placement of your hands to make yourself more comfortable.  Hold this position for 5 seconds while you keep your back relaxed.  Slowly return to lying flat on the floor. Bridges Repeat these steps 10 times:  Lie on your back on a firm surface.  Bend your knees so they are pointing toward the ceiling and your feet are flat on the floor.  Tighten your buttocks muscles and lift your buttocks off of the floor until your waist is at almost the same height as your knees. You should feel the muscles working in your buttocks and the back of your thighs. If you do not feel these muscles, slide your feet 1-2 inches farther away from your buttocks.  Hold this position for 3-5  seconds.  Slowly lower your hips to the starting position, and allow your buttocks muscles to relax completely. If this exercise is too easy, try doing it with your arms crossed over your chest. Abdominal Crunches Repeat these steps 5-10 times:  Lie on your back on a firm bed or the floor with your legs extended.  Bend your knees so they are pointing toward the ceiling and your feet are flat on the floor.  Cross your arms over your chest.  Tip your chin slightly toward your chest without bending your neck.  Tighten your abdominal muscles and slowly raise your trunk (torso) high enough to lift your shoulder blades a  tiny bit off of the floor. Avoid raising your torso higher than that, because it can put too much stress on your low back and it does not help to strengthen your abdominal muscles.  Slowly return to your starting position. Back Lifts Repeat these steps 5-10 times:  Lie on your abdomen (face-down) with your arms at your sides, and rest your forehead on the floor.  Tighten the muscles in your legs and your buttocks.  Slowly lift your chest off of the floor while you keep your hips pressed to the floor. Keep the back of your head in line with the curve in your back. Your eyes should be looking at the floor.  Hold this position for 3-5 seconds.  Slowly return to your starting position. SEEK MEDICAL CARE IF:  Your back pain or discomfort gets much worse when you do an exercise.  Your back pain or discomfort does not lessen within 2 hours after you exercise. If you have any of these problems, stop doing these exercises right away. Do not do them again unless your health care provider says that you can. SEEK IMMEDIATE MEDICAL CARE IF:  You develop sudden, severe back pain. If this happens, stop doing the exercises right away. Do not do them again unless your health care provider says that you can.   This information is not intended to replace advice given to you by your health care provider. Make sure you discuss any questions you have with your health care provider.   Document Released: 01/19/2005 Document Revised: 09/02/2015 Document Reviewed: 02/05/2015 Elsevier Interactive Patient Education 2016 [place] Injury Prevention Back injuries can be very painful. They can also be difficult to heal. After having one back injury, you are more likely to injure your back again. It is important to learn how to avoid injuring or re-injuring your back. The following tips can help you to prevent a back injury. WHAT SHOULD I KNOW ABOUT PHYSICAL FITNESS?  Exercise for 30 minutes per day on  most days of the week or as told by your doctor. Make sure to:  Do aerobic exercises, such as walking, jogging, biking, or swimming.  Do exercises that increase balance and strength, such as tai chi and yoga.  Do stretching exercises. This helps with flexibility.  Try to develop strong belly (abdominal) muscles. Your belly muscles help to support your back.  Stay at a healthy weight. This helps to decrease your risk of a back injury. WHAT SHOULD I KNOW ABOUT MY DIET?  Talk with your doctor about your overall diet. Take supplements and vitamins only as told by your doctor.  Talk with your doctor about how much calcium and vitamin D you need each day. These nutrients help to prevent weakening of the bones (osteoporosis).  Include good sources of calcium in your  diet, such as:  Dairy products.  Green leafy vegetables.  Products that have had calcium added to them (fortified).  Include good sources of vitamin D in your diet, such as:  Milk.  Foods that have had vitamin D added to them. WHAT SHOULD I KNOW ABOUT MY POSTURE?  Sit up straight and stand up straight. Avoid leaning forward when you sit or hunching over when you stand.  Choose chairs that have good low-back (lumbar) support.  If you work at a desk, sit close to it so you do not need to lean over. Keep your chin tucked in. Keep your neck drawn back. Keep your elbows bent so your arms look like the letter "L" (right angle).  Sit high and close to the steering wheel when you drive. Add a low-back support to your car seat, if needed.  Avoid sitting or standing in one position for very long. Take breaks to get up, stretch, and walk around at least one time every hour. Take breaks every hour if you are driving for long periods of time.  Sleep on your side with your knees slightly bent, or sleep on your back with a pillow under your knees. Do not lie on the front of your body to sleep. WHAT SHOULD I KNOW ABOUT LIFTING,  TWISTING, AND REACHING Lifting and Heavy Lifting  Avoid heavy lifting, especially lifting over and over again. If you must do heavy lifting:  Stretch before lifting.  Work slowly.  Rest between lifts.  Use a tool such as a cart or a dolly to move objects if one is available.  Make several small trips instead of carrying one heavy load.  Ask for help when you need it, especially when moving big objects.  Follow these steps when lifting:  Stand with your feet shoulder-width apart.  Get as close to the object as you can. Do not pick up a heavy object that is far from your body.  Use handles or lifting straps if they are available.  Bend at your knees. Squat down, but keep your heels off the floor.  Keep your shoulders back. Keep your chin tucked in. Keep your back straight.  Lift the object slowly while you tighten the muscles in your legs, belly, and butt. Keep the object as close to the center of your body as possible.  Follow these steps when putting down a heavy load:  Stand with your feet shoulder-width apart.  Lower the object slowly while you tighten the muscles in your legs, belly, and butt. Keep the object as close to the center of your body as possible.  Keep your shoulders back. Keep your chin tucked in. Keep your back straight.  Bend at your knees. Squat down, but keep your heels off the floor.  Use handles or lifting straps if they are available. Twisting and Reaching  Avoid lifting heavy objects above your waist.  Do not twist at your waist while you are lifting or carrying a load. If you need to turn, move your feet.  Do not bend over without bending at your knees.  Avoid reaching over your head, across a table, or for an object on a high surface.  WHAT ARE SOME OTHER TIPS?  Avoid wet floors and icy ground. Keep sidewalks clear of ice to prevent falls.   Do not sleep on a mattress that is too soft or too hard.   Keep items that you use often  within easy reach.   Put heavier objects  on shelves at waist level, and put lighter objects on lower or higher shelves.  Find ways to lower your stress, such as:  Exercise.  Massage.  Relaxation techniques.  Talk with your doctor if you feel anxious or depressed. These conditions can make back pain worse.  Wear flat heel shoes with cushioned soles.  Avoid making quick (sudden) movements.  Use both shoulder straps when carrying a backpack.  Do not use any tobacco products, including cigarettes, chewing tobacco, or electronic cigarettes. If you need help quitting, ask your doctor.   This information is not intended to replace advice given to you by your health care provider. Make sure you discuss any questions you have with your health care provider.   Document Released: 05/30/2008 Document Revised: 04/28/2015 Document Reviewed: 12/16/2014 Elsevier Interactive Patient Education 2016 [employer]  Degenerative Disk Disease Degenerative disk disease is a condition caused by the changes that occur in spinal disks as you grow older. Spinal disks are soft and compressible disks located between the bones of your spine (vertebrae). These disks act like shock absorbers. Degenerative disk disease can affect the whole spine. However, the neck and lower back are most commonly affected. Many changes can occur in the spinal disks with aging, such as:  The spinal disks may dry and shrink.  Small tears may occur in the tough, outer covering of the disk (annulus).  The disk space may become smaller due to loss of water.  Abnormal growths in the bone (spurs) may occur. This can put pressure on the nerve roots exiting the spinal canal, causing pain.  The spinal canal may become narrowed. RISK FACTORS   Being overweight.  Having a family history of degenerative disk disease.  Smoking.  There is increased risk if you are doing heavy lifting or have a sudden injury. SIGNS AND SYMPTOMS    Symptoms vary from person to person and may include:  Pain that varies in intensity. Some people have no pain, while others have severe pain. The location of the pain depends on the part of your backbone that is affected.  You will have neck or arm pain if a disk in the neck area is affected.  You will have pain in your back, buttocks, or legs if a disk in the lower back is affected.  Pain that becomes worse while bending, reaching up, or with twisting movements.  Pain that may start gradually and then get worse as time passes. It may also start after a major or minor injury.  Numbness or tingling in the arms or legs. DIAGNOSIS  Your health care provider will ask you about your symptoms and about activities or habits that may cause the pain. He or she may also ask about any injuries, diseases, or treatments you have had. Your health care provider will examine you to check for the range of movement that is possible in the affected area, to check for strength in your extremities, and to check for sensation in the areas of the arms and legs supplied by different nerve roots. You may also have:   An X-ray of the spine.  Other imaging tests, such as MRI. TREATMENT  Your health care provider will advise you on the best plan for treatment. Treatment may include:  Medicines.  Rehabilitation exercises. HOME CARE INSTRUCTIONS   Follow proper lifting and walking techniques as advised by your health care provider.  Maintain good posture.  Exercise regularly as advised by your health care provider.  Perform relaxation  exercises.  Change your sitting, standing, and sleeping habits as advised by your health care provider.  Change positions frequently.  Lose weight or maintain a healthy weight as advised by your health care provider.  Do not use any tobacco products, including cigarettes, chewing tobacco, or electronic cigarettes. If you need help quitting, ask your health care  provider.  Wear supportive footwear.  Take medicines only as directed by your health care provider. SEEK MEDICAL CARE IF:   Your pain does not go away within 1-4 weeks.  You have significant appetite or weight loss. SEEK IMMEDIATE MEDICAL CARE IF:   Your pain is severe.  You notice weakness in your arms, hands, or legs.  You begin to lose control of your bladder or bowel movements.  You have fevers or night sweats. MAKE SURE YOU:   Understand these instructions.  Will watch your condition.  Will get help right away if you are not doing well or get worse.   This information is not intended to replace advice given to you by your health care provider. Make sure you discuss any questions you have with your health care provider.   Document Released: 10/09/2007 Document Revised: 01/02/2015 Document Reviewed: 04/15/2014 Elsevier Interactive Patient Education [employer].

## 2016-03-21 NOTE — ED Notes (Signed)
Patient states he was running on  Friday and started having back pain at the end of his run. Patient states he is having severe back pain now.

## 2016-03-21 NOTE — ED Provider Notes (Signed)
CSN: [id number]     Arrival date & time 03/21/16  0910 History   First MD Initiated Contact with Patient 03/21/16 1022     Chief Complaint  Patient presents with  . Back Pain   (Consider location/radiation/quality/duration/timing/severity/associated sxs/prior Treatment) HPI  This is a 45 year old male who presents with severe low back pain where he indicates the middle distal lumbar spine. The patient states that he's been trying to lose weight recently has been on a walking program and on Saturday decided to run 100 yards-at the end of his walk was doing fine until his last 2 steps when he had a severe pain in his low back that has persisted. It is only worsened over the last 2 days. Now he has a burning sensation in his right buttock but no radiation below the buttock crease. Has no incontinence. He has a history of spinal stenosis having undergone an anterior cervical discectomy and fusion C4-7 with plates and screws. Any motion seems to be very painful for him.     Past Medical History  Diagnosis Date  . Gout   . IBS (irritable bowel syndrome)   . Angioedema   . Seasonal allergies   . GERD (gastroesophageal reflux disease)   . Hypertension     CONTROLLED ON MEDS  . HTN (hypertension)    Past Surgical History  Procedure Laterality Date  . Shoulder surgery Left 2007  . Nasal septum surgery  2004  . Acdf with fusion  2007  . Testicular torsion  1980s  . Vasectomy    . Colonoscopy with propofol N/A 06/26/2015    Procedure: COLONOSCOPY WITH PROPOFOL;  Surgeon: Lucilla Lame, MD;  Location: [facility];  Service: Endoscopy;  Laterality: N/A;  with biopsies   Family History  Problem Relation Age of Onset  . Breast cancer Mother   . Skin cancer Mother   . Arrhythmia Mother     A-fib  . Cancer Mother     breast, colon?, skin  . Hypertension Father   . Cancer Maternal Grandfather   . Diabetes Neg Hx   . Heart disease Neg Hx   . Stroke Neg Hx   . COPD Neg Hx    Social  History  Substance Use Topics  . Smoking status: Former Smoker -- 1.00 packs/day for 10 years    Types: Cigarettes    Quit date: 04/15/2015  . Smokeless tobacco: Never Used  . Alcohol Use: No     Comment: Rare consumption    Review of Systems  Constitutional: Positive for activity change. Negative for fever, chills and fatigue.  Musculoskeletal: Positive for back pain.  All other systems reviewed and are negative.   Allergies  Allopurinol  Home Medications   Prior to Admission medications   Medication Sig Start Date End Date Taking? Authorizing Provider  atenolol (TENORMIN) 50 MG tablet TAKE 1 TABLET BY MOUTH DAILY 12/21/15  Yes Arnetha Courser, MD  belladonna-PHENObarbital (DONNATAL) 16.2 MG/5ML ELIX Take 5 mLs (16.2 mg total) by mouth 4 (four) times daily as needed for cramping or pain. 05/28/15  Yes Lucilla Lame, MD  cetirizine (ZYRTEC) 10 MG tablet Take 10 mg by mouth daily.   Yes Historical Provider, MD  colchicine 0.6 MG tablet Take 0.6 mg by mouth daily.   Yes Historical Provider, MD  diphenhydrAMINE (BENADRYL) 25 mg capsule Take 25 mg by mouth 2 (two) times daily.   Yes Historical Provider, MD  EPINEPHrine (EPIPEN 2-PAK) 0.3 mg/0.3 mL IJ SOAJ injection  Inject 0.3 mg into the muscle once.   Yes Historical Provider, MD  indomethacin (INDOCIN) 25 MG capsule Take 25 mg by mouth as needed.    Yes Historical Provider, MD  Melatonin 5 MG TABS Take 5 mg by mouth at bedtime.    Yes Historical Provider, MD  ranitidine (ZANTAC) 150 MG capsule Take 150 mg by mouth daily.   Yes Historical Provider, MD  cyclobenzaprine (FLEXERIL) 10 MG tablet Take 1 tablet (10 mg total) by mouth every 8 (eight) hours as needed for muscle spasms. Do not drive for 8 hours after taking med 10/26/15   Arnetha Courser, MD  diazepam (VALIUM) 2 MG tablet Take 1 tablet (2 mg total) by mouth 3 (three) times daily. 03/21/16   Lorin Picket, PA-C  febuxostat (ULORIC) 40 MG tablet Take 40 mg by mouth daily.     Historical Provider, MD  metaxalone (SKELAXIN) 800 MG tablet Take 1 tablet (800 mg total) by mouth 3 (three) times daily. 03/21/16   Lorin Picket, PA-C  naproxen (NAPROSYN) 500 MG tablet Take 1 tablet (500 mg total) by mouth 2 (two) times daily with a meal. 03/21/16   Lorin Picket, PA-C   Meds Ordered and Administered this Visit   Medications  ketorolac (TORADOL) injection 60 mg (60 mg Intramuscular Given 03/21/16 1037)    BP 137/98 mmHg  Temp(Src) 97.8 F (36.6 C) (Tympanic)  Resp 20  Ht 6' (1.829 m)  Wt 230 lb (104.327 kg)  BMI 31.19 kg/m2  SpO2 100% No data found.   Physical Exam  Constitutional: He is oriented to person, place, and time. He appears well-developed and well-nourished. No distress.  HENT:  Head: Normocephalic and atraumatic.  Eyes: Conjunctivae are normal. Pupils are equal, round, and reactive to light.  Neck: Normal range of motion. Neck supple.  Musculoskeletal: He exhibits tenderness.  Examination of the lumbar spine shows the patient to favor slight forward flexion. His pelvis is level in stance. Very limited range of motion both to forward flexion extension and lateral of flexion particularly to the right. He is only able to step few steps but is able to heel and toe walk during those times. Sensation is intact to light touch throughout. DTRs are hyperreflexive bilaterally at the knee greater than the ankle 3 over 4 versus 2 over 4. EHL peroneal and anterior tibialis are strong to clinical testing. Her leg raise testing in the sitting position is negative to 90 bilaterally. Does have tenderness on the base of the spine as well as the right sacroiliac joint.  Neurological: He is alert and oriented to person, place, and time. He has normal reflexes. He displays normal reflexes. He exhibits normal muscle tone. Coordination normal.  Skin: Skin is warm and dry. He is not diaphoretic.  Psychiatric: He has a normal mood and affect. His behavior is normal. Judgment  and thought content normal.    ED Course  Procedures (including critical care time)  Labs Review Labs Reviewed - No data to display  Imaging Review Dg Lumbar Spine Complete  03/21/2016  CLINICAL DATA:  Lumbago without radicular symptoms EXAM: LUMBAR SPINE - COMPLETE 4+ VIEW COMPARISON:  None. FINDINGS: Frontal, lateral, spot lumbosacral lateral, and bilateral oblique views were obtained. There are 5 non-rib-bearing lumbar type vertebral bodies. There is no fracture or spondylolisthesis. There is mild disc space narrowing at all levels, most notably at L1-2 and L4-5. There are anterior osteophytes at L1, L2, and L3. There is facet arthropathic  change at L5-S1 bilaterally. There are scattered foci of calcification in the aorta. IMPRESSION: Osteoarthritic change at multiple levels. No fracture or spondylolisthesis. Electronically Signed   By: Lowella Grip III M.D.   On: 03/21/2016 11:22     Visual Acuity Review  Right Eye Distance:   Left Eye Distance:   Bilateral Distance:    Right Eye Near:   Left Eye Near:    Bilateral Near:         MDM   1. DDD (degenerative disc disease), lumbar   2. Lumbar strain, initial encounter    Discharge Medication List as of 03/21/2016 11:44 AM    START taking these medications   Details  diazepam (VALIUM) 2 MG tablet Take 1 tablet (2 mg total) by mouth 3 (three) times daily., Starting 03/21/2016, Until Discontinued, Print    metaxalone (SKELAXIN) 800 MG tablet Take 1 tablet (800 mg total) by mouth 3 (three) times daily., Starting 03/21/2016, Until Discontinued, Normal    naproxen (NAPROSYN) 500 MG tablet Take 1 tablet (500 mg total) by mouth 2 (two) times daily with a meal., Starting 03/21/2016, Until Discontinued, Normal      Plan: 1. Test/x-ray results and diagnosis reviewed with patient 2. rx as per orders; risks, benefits, potential side effects reviewed with patient 3. Recommend supportive treatment with Rest and symptom avoidance.  I've advised him against prolonged sitting lifting or bending as these are bad for the lumbar spine. Ultimately he needs to work on his core muscles. Walking is a very good exercise by running should not be one that he undertakes. If he has any increase in his symptoms or any changes he should follow-up with his last spinal surgeon. 4. F/u prn if symptoms worsen or don't improve     Lorin Picket, PA-C 03/21/16 1148

## 2016-05-15 ENCOUNTER — Telehealth: Payer: Self-pay | Admitting: Family Medicine

## 2016-05-15 NOTE — Telephone Encounter (Signed)
Notice received about Epipen recall Please ask the patient to locate his Epipen and call [employer] at [phone] to return and replace his device if the lot # is involved in the recall [employer] has offered to replace the devices at no cost; he can go to [email or web address] to learn more He can also call [facility] at [phone], and he can check the FDA press release for the EpiPen recall at [email or web address] Thank you

## 2016-05-24 NOTE — Telephone Encounter (Signed)
Please see below - thank you!

## 2016-05-25 NOTE — Telephone Encounter (Signed)
Pt already notified

## 2016-07-08 ENCOUNTER — Emergency Department
Admission: EM | Admit: 2016-07-08 | Discharge: 2016-07-08 | Disposition: A | Discharge status: Home / Self Care | Payer: Managed Care, Other (non HMO) | Attending: Emergency Medicine | Admitting: Emergency Medicine

## 2016-07-08 ENCOUNTER — Telehealth: Payer: Self-pay | Admitting: Family Medicine

## 2016-07-08 ENCOUNTER — Ambulatory Visit (INDEPENDENT_AMBULATORY_CARE_PROVIDER_SITE_OTHER): Payer: Managed Care, Other (non HMO) | Admitting: Family Medicine

## 2016-07-08 ENCOUNTER — Encounter: Payer: Self-pay | Admitting: Family Medicine

## 2016-07-08 VITALS — BP 128/88 | HR 94 | Temp 98.1°F | Resp 18 | Ht 72.0 in | Wt 217.4 lb

## 2016-07-08 DIAGNOSIS — Z79899 Other long term (current) drug therapy: Secondary | ICD-10-CM | POA: Insufficient documentation

## 2016-07-08 DIAGNOSIS — E1065 Type 1 diabetes mellitus with hyperglycemia: Secondary | ICD-10-CM | POA: Insufficient documentation

## 2016-07-08 DIAGNOSIS — R739 Hyperglycemia, unspecified: Secondary | ICD-10-CM

## 2016-07-08 DIAGNOSIS — IMO0002 Reserved for concepts with insufficient information to code with codable children: Secondary | ICD-10-CM

## 2016-07-08 DIAGNOSIS — Z87891 Personal history of nicotine dependence: Secondary | ICD-10-CM | POA: Insufficient documentation

## 2016-07-08 DIAGNOSIS — I10 Essential (primary) hypertension: Secondary | ICD-10-CM | POA: Insufficient documentation

## 2016-07-08 LAB — GLUCOSE, POCT (MANUAL RESULT ENTRY): POC GLUCOSE: HIGH mg/dL (ref 70–99)

## 2016-07-08 LAB — BASIC METABOLIC PANEL
Anion gap: 12 (ref 5–15)
BUN: 11 mg/dL (ref 6–20)
CALCIUM: 9.6 mg/dL (ref 8.9–10.3)
CO2: 21 mmol/L — ABNORMAL LOW (ref 22–32)
CREATININE: 0.9 mg/dL (ref 0.61–1.24)
Chloride: 98 mmol/L — ABNORMAL LOW (ref 101–111)
Glucose, Bld: 497 mg/dL — ABNORMAL HIGH (ref 65–99)
Potassium: 4.2 mmol/L (ref 3.5–5.1)
SODIUM: 131 mmol/L — AB (ref 135–145)

## 2016-07-08 LAB — HEPATIC FUNCTION PANEL
ALK PHOS: 123 U/L (ref 38–126)
ALT: 57 U/L (ref 17–63)
AST: 41 U/L (ref 15–41)
Albumin: 4.6 g/dL (ref 3.5–5.0)
BILIRUBIN DIRECT: 0.2 mg/dL (ref 0.1–0.5)
BILIRUBIN INDIRECT: 1.1 mg/dL — AB (ref 0.3–0.9)
TOTAL PROTEIN: 7.7 g/dL (ref 6.5–8.1)
Total Bilirubin: 1.3 mg/dL — ABNORMAL HIGH (ref 0.3–1.2)

## 2016-07-08 LAB — CBC
HCT: 51.6 % (ref 40.0–52.0)
HEMOGLOBIN: 17.7 g/dL (ref 13.0–18.0)
MCH: 28.7 pg (ref 26.0–34.0)
MCHC: 34.2 g/dL (ref 32.0–36.0)
MCV: 83.8 fL (ref 80.0–100.0)
PLATELETS: 187 10*3/uL (ref 150–440)
RBC: 6.16 MIL/uL — AB (ref 4.40–5.90)
RDW: 13 % (ref 11.5–14.5)
WBC: 8.2 10*3/uL (ref 3.8–10.6)

## 2016-07-08 LAB — GLUCOSE, CAPILLARY: Glucose-Capillary: 365 mg/dL — ABNORMAL HIGH (ref 65–99)

## 2016-07-08 LAB — TROPONIN I

## 2016-07-08 LAB — POCT GLYCOSYLATED HEMOGLOBIN (HGB A1C): Hemoglobin A1C: >14

## 2016-07-08 MED ORDER — GLIPIZIDE 5 MG PO TABS: ORAL_TABLET | ORAL | Status: DC

## 2016-07-08 MED ORDER — SODIUM CHLORIDE 0.9 % IV BOLUS (SEPSIS)
1000.0000 mL | Freq: Once | INTRAVENOUS | Status: AC
  Administered 2016-07-08: 1000 mL via INTRAVENOUS

## 2016-07-08 MED ORDER — GLUCOSE BLOOD VI STRP: ORAL_STRIP | Status: DC

## 2016-07-08 MED ORDER — GLIPIZIDE 5 MG PO TABS
5.0000 mg | ORAL_TABLET | Freq: Once | ORAL | Status: AC
  Administered 2016-07-08: 5 mg via ORAL
  Filled 2016-07-08: qty 1

## 2016-07-08 MED ORDER — FREESTYLE FREEDOM LITE W/DEVICE KIT: PACK | Status: DC

## 2016-07-08 NOTE — Assessment & Plan Note (Signed)
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

## 2016-07-08 NOTE — Progress Notes (Addendum)
BP 128/88 mmHg  Pulse 94  Temp(Src) 98.1 F (36.7 C)  Resp 18  Ht 6' (1.829 m)  Wt 217 lb 6 oz (98.601 kg)  BMI 29.48 kg/m2  SpO2 97%   Subjective:    Patient ID: John Hartman, male    DOB: Oct 09, 1971, 45 y.o.   MRN: [id number]  HPI: John Hartman is a 45 y.o. male  Chief Complaint  Patient presents with  . diabetic symptoms(thirst,wt loss,cramps)  . Polydipsia  . Blurred Vision   Patient is here with symptoms suggestive of diabetes; he has been reading about it and thinks he has diabetes He has exceesive thirst and excessive urination; starts drinking first thing in the morning and keeps drinking all day; 10 Yetis a day of water or flavored water Patient lost significant weight over the last several months; our chart shows 27 pounds of weight loss over last 3 visits He weighed 244 pounds at the cardiologist in August 2016; he was 242 pounds with me in October 2016; he is 217 pounds today He has been trying to eat better, the best he can without exercising because of his back, but weight is coming off easier than it could be Blurred vision; is vision has taken a nosedive He has excessive dry mouth Getting up at night to urinate every hour  Relevant past medical, surgical, family and social history reviewed Past Medical History  Diagnosis Date  . Gout   . IBS (irritable bowel syndrome)   . Angioedema   . Seasonal allergies   . GERD (gastroesophageal reflux disease)   . Hypertension     CONTROLLED ON MEDS  . HTN (hypertension)    Family History  Problem Relation Age of Onset  . Breast cancer Mother   . Skin cancer Mother   . Arrhythmia Mother     A-fib  . Cancer Mother     breast, colon?, skin  . Hypertension Father   . Cancer Maternal Grandfather   . Diabetes Neg Hx   . Heart disease Neg Hx   . Stroke Neg Hx   . COPD Neg Hx   Diabetes in the family; his father is adopted, into the extended family; never knew his real mother  Social History  Substance Use  Topics  . Smoking status: Former Smoker -- 1.00 packs/day for 10 years    Types: Cigarettes    Quit date: 04/15/2015  . Smokeless tobacco: Never Used  . Alcohol Use: No     Comment: Rare consumption   Interim medical history since last visit reviewed. Allergies and medications reviewed  Review of Systems Per HPI unless specifically indicated above     Objective:    BP 128/88 mmHg  Pulse 94  Temp(Src) 98.1 F (36.7 C)  Resp 18  Ht 6' (1.829 m)  Wt 217 lb 6 oz (98.601 kg)  BMI 29.48 kg/m2  SpO2 97%  Wt Readings from Last 3 Encounters:  07/08/16 217 lb (98.431 kg)  07/08/16 217 lb 6 oz (98.601 kg)  03/21/16 230 lb (104.327 kg)    Physical Exam  Constitutional: He appears well-developed and well-nourished.  Weight loss of 25 pounds over last 9 months per office notes  HENT:  Tacky mucous membranes with some geographic tongue, maybe some whitish coating  Cardiovascular: Normal rate and regular rhythm.   Pulmonary/Chest: Effort normal and breath sounds normal.   Results for orders placed or performed in visit on 07/08/16  POCT HgB A1C  Result Value Ref Range  Hemoglobin A1C >14   POCT Glucose (CBG)  Result Value Ref Range   POC Glucose high 70 - 99 mg/dl      Assessment & Plan:   Problem List Items Addressed This Visit      Endocrine   New onset type 1 diabetes mellitus, uncontrolled ([place])    I strongly suspect that he has new onset type 1 diabetes; if not type 1, then at last LADA, though this would be a rapid progression with my limited understanding of that category of diabetes; patient will go from here to the ER for evaluation and treatment, which I explained will consist of additional labs, IVF, and insulin primarily; I wish this was the type of diabetes that I could start him on pills for, which is what I believe he was hoping, but I just don't think he has type 2 diabetes and needs insulin; he agreed to go right to the ER and I called report with my suspicions  to staff there       Other Visit Diagnoses    Hyperglycemia, unspecified    -  Primary    Relevant Orders    POCT HgB A1C (Completed)    POCT Glucose (CBG) (Completed)       Follow up plan: No Follow-up on file. --> after ER / hospitalization  An after-visit summary was printed and given to the patient at [facility].  Please see the patient instructions which may contain other information and recommendations beyond what is mentioned above in the assessment and plan.  Meds ordered this encounter  Medications  . allopurinol (ZYLOPRIM) 100 MG tablet    Sig: Take 100 mg by mouth daily.   Orders Placed This Encounter  Procedures  . POCT HgB A1C  . POCT Glucose (CBG)   To ER now --> type 1 diabetes suspected Check out given to ER staff 14:58 hours  ------------------------- ------------------------- Second visit 07/09/2016, Saturday at 2:30 pm, seen at the office Weight 221 pounds, pulse 79, pulse ox 98%  Patient here with his wife for f/u of abnormal labs on Saturday at 2:30 pm; visit lasted about 45 minutes He was sent from my office to the ER yesterday with symptoms of new onset diabetes, suspected to be type 1; his glucose would not read on our meter here, and his A1c was over 14; he was seen in the ER, given two liters of IVF, sulfonylurea, and discharged to home I spoke to him on the phone last night and today; he went back up to the hospital this afternoon to get a urine to see if ketones were present, and there were trace ketones, urine glucose over 500; bmp showed that acidosis had resolved though after IVF I spoke with hospitalist to see about admission; he did not feel patient met criteria for hospitalization without acidosis I called patient and brought him to the office to give him insulin here He had significant improvement in his level of thirst after the 2 bags of IVF; he has headache and is fatigued, but does not have abdominal pain, no fruity breath, no confusion He  says the pharmacist did not think the pill was going to work for him He did pick up the glucometer and has been checking his sugars Then had episodes last summer of possible hypoglycemia; went to see heart doctor and all checked out okay I asked about any other episodes that might have involved diabetes or his pancreas; he recalls years ago having had abdominal  pain; pain was so severe, thought it was pancreatitis; doctor gave him pain medicine, cleared up after a few hours; he may have had an attack of pancreatitis about 8-9 years ago, but none since then  PMH and PSH reviewed Fam hx reviewed Meds reviewed: he is not taking uloric, just allopurinol; he has colchicine for flares; he is not taking skelaxin or NSAID  Weight 221, pulse ox 98%, heart rate 79 Weight up 4 pounds since yesterday; alert, no confusion, no fruity smell to breath; respirations even and unlabored, regular cardiac rate; does not appear dehydrated, no jaundice, ambulatory, normal gait, euthymic  A/P Hyperglycemia, glucosuria, trace ketonuria; suspect type 1 diabetes mellitus, or perhaps type 1.5 (LADA); type 2 diabetes still possible but less likely given rapid onset and presentation with ketones in urine  Our hospitalist was kind enough to look at his labs and discuss his case with me, and did not think admission indicated Patient met me here today and insulin therapy was discussed; discussed side effects, what to expect; ten units of Tresiba given in the right lower abdomen by MD just before 3 pm on Saturday, with teaching and demonstration with patient; patient demonstrated use, putting insulin in the sink, believes he will be able to administer his own injection tomorrow; I offered to meet him back here at the office to witness/give shot and he declined Discussed reasons to go to urgent care or back to the ER (development of DKA, symptoms reviewed in detail with patient and wife, see AVS); I'll do my best to treat this as an  outpatient given the circumstances Will be bringing sugars down over next few weeks; we can increase the long-acting insulin by three units or so every three days; he will check his sugars TID AC and QHS, and more often if symptomatic I did not want to overwhelm him and his wife by introducing meal-time, sliding scale insulin, though if truly type 1 diabetic, that will be indicated; explained we'll see what the GAD Ab test shows and refer to endocrinologist if needed Referral entered by diabetic teaching already Typed out parameters for glucose readings for now (less than 200 will be short-term goal, and then under 150 before meals will be when well-controlled, under 140 fasting excellent control); post-prandials should be under 200 at least, and under 180 ideally; these numbers may change as we get his disease under control Hydration discussed, and he will use water and propel to get electrolytes; reviewed his BMP with him which showed improvement in his CO2, Na+ and Cl- from yesterday to today Several educational hand-outs given in the after visit summary Patient is welcome to call with questions, and we plan to talk on Monday or Tuesday to see how he's doing, what his sugars have been, and to adjust the dose of insulin as needed By the end of our lengthy visit, patient was feeling perhaps a little better, no ill effects, sounded motivated, insulin pen with instructions given; patient and wife agree with plan  Face-to-face time with patient was more than 40 minutes, >50% time spent counseling and coordination of care Signed July 09, 2016 at 1603 hours

## 2016-07-08 NOTE — Telephone Encounter (Signed)
I called patient; I was rather surprised to see that he was released; he was given a pill already and sent home with Rx for sulfonylurea He was given two liters of fluids and glucose came down Pt says that doctor in ER said he has type 2 diabetes and pills will treat this I want patient to get a glucometer and check his FSBS 2 more times tonight and again 2 times in the morning, once fasting and another before lunch and then I'll call him I still think he needs insulin I'll come back into town and we can start [place] insulin here at the office tomorrow (Saturday) if needed He agrees to monitor glucose and we'll talk tomorrow

## 2016-07-08 NOTE — Discharge Instructions (Signed)
You were evaluated for high blood sugar, and you are being started on vacation for diabetes, glipizide. Return to emergency department for any worsening condition including confusion altered mental status, weakness or numbness, dizziness or passing out, chest pain or trouble breathing, fevers, or any other symptoms concerning to you. Next the next line you do need to be rechecked to your primary doctor's office on Monday, please call to make this appointment.   Hyperglycemia Hyperglycemia occurs when the glucose (sugar) in your blood is too high. Hyperglycemia can happen for many reasons, but it most often happens to people who do not know they have diabetes or are not managing their diabetes properly.  CAUSES  Whether you have diabetes or not, there are other causes of hyperglycemia. Hyperglycemia can occur when you have diabetes, but it can also occur in other situations that you might not be as aware of, such as: Diabetes  If you have diabetes and are having problems controlling your blood glucose, hyperglycemia could occur because of some of the following reasons:  Not following your meal plan.  Not taking your diabetes medications or not taking it properly.  Exercising less or doing less activity than you normally do.  Being sick. Pre-diabetes  This cannot be ignored. Before people develop Type 2 diabetes, they almost always have "pre-diabetes." This is when your blood glucose levels are higher than normal, but not yet high enough to be diagnosed as diabetes. Research has shown that some long-term damage to the body, especially the heart and circulatory system, may already be occurring during pre-diabetes. If you take action to manage your blood glucose when you have pre-diabetes, you may delay or prevent Type 2 diabetes from developing. Stress  If you have diabetes, you may be "diet" controlled or on oral medications or insulin to control your diabetes. However, you may find that your  blood glucose is higher than usual in the hospital whether you have diabetes or not. This is often referred to as "stress hyperglycemia." Stress can elevate your blood glucose. This happens because of hormones put out by the body during times of stress. If stress has been the cause of your high blood glucose, it can be followed regularly by your caregiver. That way he/she can make sure your hyperglycemia does not continue to get worse or progress to diabetes. Steroids  Steroids are medications that act on the infection fighting system (immune system) to block inflammation or infection. One side effect can be a rise in blood glucose. Most people can produce enough extra insulin to allow for this rise, but for those who cannot, steroids make blood glucose levels go even higher. It is not unusual for steroid treatments to "uncover" diabetes that is developing. It is not always possible to determine if the hyperglycemia will go away after the steroids are stopped. A special blood test called an A1c is sometimes done to determine if your blood glucose was elevated before the steroids were started. SYMPTOMS  Thirsty.  Frequent urination.  Dry mouth.  Blurred vision.  Tired or fatigue.  Weakness.  Sleepy.  Tingling in feet or leg. DIAGNOSIS  Diagnosis is made by monitoring blood glucose in one or all of the following ways:  A1c test. This is a chemical found in your blood.  Fingerstick blood glucose monitoring.  Laboratory results. TREATMENT  First, knowing the cause of the hyperglycemia is important before the hyperglycemia can be treated. Treatment may include, but is not be limited to:  Education.  Change or adjustment in medications.  Change or adjustment in meal plan.  Treatment for an illness, infection, etc.  More frequent blood glucose monitoring.  Change in exercise plan.  Decreasing or stopping steroids.  Lifestyle changes. HOME CARE INSTRUCTIONS   Test your blood  glucose as directed.  Exercise regularly. Your caregiver will give you instructions about exercise. Pre-diabetes or diabetes which comes on with stress is helped by exercising.  Eat wholesome, balanced meals. Eat often and at regular, fixed times. Your caregiver or nutritionist will give you a meal plan to guide your sugar intake.  Being at an ideal weight is important. If needed, losing as little as 10 to 15 pounds may help improve blood glucose levels. SEEK MEDICAL CARE IF:   You have questions about medicine, activity, or diet.  You continue to have symptoms (problems such as increased thirst, urination, or weight gain). SEEK IMMEDIATE MEDICAL CARE IF:   You are vomiting or have diarrhea.  Your breath smells fruity.  You are breathing faster or slower.  You are very sleepy or incoherent.  You have numbness, tingling, or pain in your feet or hands.  You have chest pain.  Your symptoms get worse even though you have been following your caregiver's orders.  If you have any other questions or concerns.   This information is not intended to replace advice given to you by your health care provider. Make sure you discuss any questions you have with your health care provider.   Document Released: 06/07/2001 Document Revised: 03/05/2012 Document Reviewed: 08/18/2015 Elsevier Interactive Patient Education [employer].

## 2016-07-08 NOTE — ED Notes (Signed)
Pt states that he has had excessive thirst since march, increased blurred vision in the past month, saw his dr today and his was unable to register his blood sugar on their meter, pt states muscle cramps and fatigue,  pt denies hx of diabetes

## 2016-07-08 NOTE — ED Provider Notes (Signed)
[facility] Emergency Department Provider Note   ____________________________________________  Time seen: Approximately 3:50 PM I have reviewed the triage vital signs and the triage nursing note.  HISTORY  Chief Complaint Hyperglycemia   Historian Patient  HPI John Hartman is a 45 y.o. male who is presenting from his primary care physician's office with a fingerstick of critically high blood glucose. No prior history of diabetes. Uncle and grandfather with diabetes. It sounds like for several months he has had excessive thirst, excessive urination and intermittent blurry vision. Today he made an appointment with his doctor because of cramping in his calves and blurry vision. No chest pain or trouble breathing. No fevers. He has had weight loss of about 40 pounds in the past 6 months.  His wife had been bugging him that his symptoms were consistent with possible diabetes, and he was going to make an appointment with his doctor for a few weeks, but because of the cramping in the calves this morning, he made a point to [facility] today and they did a fingerstick and was found to be critically high.  No calf pain now. Symptoms are moderate.  He reports blurry vision, but no weakness or numbness, confusion or altered mental status, or sensory disturbances.    Past Medical History  Diagnosis Date  . Gout   . IBS (irritable bowel syndrome)   . Angioedema   . Seasonal allergies   . GERD (gastroesophageal reflux disease)   . Hypertension     CONTROLLED ON MEDS  . HTN (hypertension)     Patient Active Problem List   Diagnosis Date Noted  . New onset type 1 diabetes mellitus, uncontrolled ([place]) 07/08/2016  . 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 Palpitations 08/12/2015  . Family history of malignant neoplasm of gastrointestinal tract   . Benign neoplasm of rectosigmoid junction   . Essential hypertension 05/28/2015  . Gout 05/28/2015  . IBS (irritable bowel syndrome)  05/28/2015    Past Surgical History  Procedure Laterality Date  . Shoulder surgery Left 2007  . Nasal septum surgery  2004  . Acdf with fusion  2007  . Testicular torsion  1980s  . Vasectomy    . Colonoscopy with propofol N/A 06/26/2015    Procedure: COLONOSCOPY WITH PROPOFOL;  Surgeon: Lucilla Lame, MD;  Location: [facility] [place];  Service: Endoscopy;  Laterality: N/A;  with biopsies    Current Outpatient Rx  Name  Route  Sig  Dispense  Refill  . atenolol (TENORMIN) 50 MG tablet   Oral   Take 50 mg by mouth daily.         Marland Kitchen allopurinol (ZYLOPRIM) 100 MG tablet   Oral   Take 100 mg by mouth daily.         . belladonna-PHENObarbital (DONNATAL) 16.2 MG/5ML ELIX   Oral   Take 5 mLs (16.2 mg total) by mouth 4 (four) times daily as needed for cramping or pain.   600 mL   1   . cetirizine (ZYRTEC) 10 MG tablet   Oral   Take 10 mg by mouth daily.         . colchicine 0.6 MG tablet   Oral   Take 0.6 mg by mouth daily.         . diphenhydrAMINE (BENADRYL) 25 mg capsule   Oral   Take 25 mg by mouth 2 (two) times daily.         Marland Kitchen EPINEPHrine (EPIPEN 2-PAK) 0.3 mg/0.3 mL IJ SOAJ  injection   Intramuscular   Inject 0.3 mg into the muscle once.         . febuxostat (ULORIC) 40 MG tablet   Oral   Take 40 mg by mouth daily.         Marland Kitchen glipiZIDE (GLUCOTROL) 5 MG tablet      5mg  daily   30 tablet   0   . indomethacin (INDOCIN) 25 MG capsule   Oral   Take 25 mg by mouth as needed.          . Melatonin 5 MG TABS   Oral   Take 5 mg by mouth at bedtime.          . metaxalone (SKELAXIN) 800 MG tablet   Oral   Take 1 tablet (800 mg total) by mouth 3 (three) times daily.   21 tablet   0   . naproxen (NAPROSYN) 500 MG tablet   Oral   Take 1 tablet (500 mg total) by mouth 2 (two) times daily with a meal.   60 tablet   0   . ranitidine (ZANTAC) 150 MG capsule   Oral   Take 150 mg by mouth daily.           Allergies Review of patient's  allergies indicates no known allergies.  Family History  Problem Relation Age of Onset  . Breast cancer Mother   . Skin cancer Mother   . Arrhythmia Mother     A-fib  . Cancer Mother     breast, colon?, skin  . Hypertension Father   . Cancer Maternal Grandfather   . Diabetes Neg Hx   . Heart disease Neg Hx   . Stroke Neg Hx   . COPD Neg Hx     Social History Social History  Substance Use Topics  . Smoking status: Former Smoker -- 1.00 packs/day for 10 years    Types: Cigarettes    Quit date: 04/15/2015  . Smokeless tobacco: Never Used  . Alcohol Use: No     Comment: Rare consumption    Review of Systems  Constitutional: Negative for fever. Eyes: Blurry vision as per history of present illness.. ENT: Negative for sore throat. Cardiovascular: Negative for chest pain. Respiratory: Negative for shortness of breath. Gastrointestinal: Some nausea. Negative for abdominal pain, vomiting and diarrhea. Genitourinary: Negative for dysuria. Positive for frequency of urination. Musculoskeletal: Negative for back pain. Skin: Negative for rash. Neurological: Negative for headache. 10 point Review of Systems otherwise negative ____________________________________________   PHYSICAL EXAM:  VITAL SIGNS: ED Triage Vitals  Enc Vitals Group     BP 07/08/16 1522 148/107 mmHg     Pulse Rate 07/08/16 1522 95     Resp 07/08/16 1522 18     Temp 07/08/16 1522 98.7 F (37.1 C)     Temp Source 07/08/16 1522 Oral     SpO2 07/08/16 1522 97 %     Weight 07/08/16 1522 217 lb (98.431 kg)     Height 07/08/16 1522 6' (1.829 m)     Head Cir --      Peak Flow --      Pain Score 07/08/16 1522 5     Pain Loc --      Pain Edu? --      Excl. in [place]? --      Constitutional: Alert and oriented. Well appearing and in no distress. HEENT   Head: Normocephalic and atraumatic.      Eyes: Conjunctivae are normal.  PERRL. Normal extraocular movements.      Ears:         Nose: No  congestion/rhinnorhea.   Mouth/Throat: Mucous membranes are moist.   Neck: No stridor. Cardiovascular/Chest: Normal rate, regular rhythm.  No murmurs, rubs, or gallops. Respiratory: Normal respiratory effort without tachypnea nor retractions. Breath sounds are clear and equal bilaterally. No wheezes/rales/rhonchi. Gastrointestinal: Soft. No distention, no guarding, no rebound. Nontender.    Genitourinary/rectal:Deferred Musculoskeletal: Nontender with normal range of motion in all extremities. No joint effusions.  No lower extremity tenderness.  No edema. Neurologic:  Normal speech and language. No gross or focal neurologic deficits are appreciated. Skin:  Skin is warm, dry and intact. No rash noted. Psychiatric: Mood and affect are normal. Speech and behavior are normal. Patient exhibits appropriate insight and judgment.  ____________________________________________   EKG I, Lisa Roca, MD, the attending physician have personally viewed and interpreted all ECGs.  80 bpm. Normal sinus rhythm. Narrow QRS. Q waves anteriorly.  Normal axis. Normal ST and T-wave ____________________________________________  LABS (pertinent positives/negatives)  Labs Reviewed  BASIC METABOLIC PANEL - Abnormal; Notable for the following:    Sodium 131 (*)    Chloride 98 (*)    CO2 21 (*)    Glucose, Bld 497 (*)    All other components within normal limits  CBC - Abnormal; Notable for the following:    RBC 6.16 (*)    All other components within normal limits  HEPATIC FUNCTION PANEL - Abnormal; Notable for the following:    Total Bilirubin 1.3 (*)    Indirect Bilirubin 1.1 (*)    All other components within normal limits  GLUCOSE, CAPILLARY - Abnormal; Notable for the following:    Glucose-Capillary 365 (*)    All other components within normal limits  TROPONIN I  URINALYSIS COMPLETEWITH MICROSCOPIC ([facility] ONLY)  HEMOGLOBIN A1C  CBG MONITORING, ED     ____________________________________________  RADIOLOGY All Xrays were viewed by me. Imaging interpreted by Radiologist.  None __________________________________________  PROCEDURES  Procedure(s) performed: None  Critical Care performed: None  ____________________________________________   ED COURSE / ASSESSMENT AND PLAN  Pertinent labs & imaging results that were available during my care of the patient were reviewed by me and considered in my medical decision making (see chart for details).   This patient having symptomatic hyperglycemia, new diagnosis of diabetes. Laboratory studies are not consistent with DKA.  After 2 L normal saline IV bolus, blood sugar down into the 300s.  I spoke with one of the hospitalist, Dr.Weiting about different options for initiating treatment. We agreed no inpatient hospitalization indicated. Patient will be started on glipizide to help manage his blood sugar quickly to help alleviate his symptoms.  Patient will be started on glipizide 5 mg once daily. He is to follow-up with his primary care doctor on Monday. A hemoglobin A1c was sent, not back yet. This can be followed up at his primary care appointment.     Patient / Family / Caregiver informed of clinical course, medical decision-making process, and agree with plan.   I discussed return precautions, follow-up instructions, and discharged instructions with patient and/or family.   ___________________________________________   FINAL CLINICAL IMPRESSION(S) / ED DIAGNOSES   Final diagnoses:  Hyperglycemia              Note: This dictation was prepared with Dragon dictation. Any transcriptional errors that result from this process are unintentional   Lisa Roca, MD 07/08/16 1745

## 2016-07-08 NOTE — ED Notes (Signed)
Pt c/o increased thirst and urination over last couple months. Has had some blurry vision that is also worse.

## 2016-07-09 ENCOUNTER — Ambulatory Visit: Payer: Managed Care, Other (non HMO) | Admitting: Family Medicine

## 2016-07-09 ENCOUNTER — Telehealth: Payer: Self-pay | Admitting: Family Medicine

## 2016-07-09 ENCOUNTER — Other Ambulatory Visit
Admission: RE | Admit: 2016-07-09 | Discharge: 2016-07-09 | Disposition: A | Discharge status: Home / Self Care | Payer: Managed Care, Other (non HMO) | Source: Ambulatory Visit | Attending: Family Medicine | Admitting: Family Medicine

## 2016-07-09 DIAGNOSIS — E1065 Type 1 diabetes mellitus with hyperglycemia: Secondary | ICD-10-CM | POA: Insufficient documentation

## 2016-07-09 DIAGNOSIS — IMO0002 Reserved for concepts with insufficient information to code with codable children: Secondary | ICD-10-CM

## 2016-07-09 LAB — URINALYSIS COMPLETE WITH MICROSCOPIC (ARMC ONLY)
BILIRUBIN URINE: NEGATIVE
Bacteria, UA: NONE SEEN
Hgb urine dipstick: NEGATIVE
Leukocytes, UA: NEGATIVE
Nitrite: NEGATIVE
Protein, ur: NEGATIVE mg/dL
RBC / HPF: NONE SEEN RBC/hpf (ref 0–5)
SQUAMOUS EPITHELIAL / LPF: NONE SEEN
Specific Gravity, Urine: 1.029 (ref 1.005–1.030)
pH: 5 (ref 5.0–8.0)

## 2016-07-09 LAB — BASIC METABOLIC PANEL
ANION GAP: 9 (ref 5–15)
BUN: 13 mg/dL (ref 6–20)
CALCIUM: 9.3 mg/dL (ref 8.9–10.3)
CO2: 28 mmol/L (ref 22–32)
CREATININE: 1.15 mg/dL (ref 0.61–1.24)
Chloride: 101 mmol/L (ref 101–111)
GFR calc Af Amer: >60 mL/min (ref >60)
GLUCOSE: 293 mg/dL — AB (ref 65–99)
Potassium: 4.1 mmol/L (ref 3.5–5.1)
Sodium: 138 mmol/L (ref 135–145)

## 2016-07-09 LAB — HEMOGLOBIN A1C: HEMOGLOBIN A1C: UNDETERMINED % (ref 4.0–6.0)

## 2016-07-09 MED ORDER — INSULIN DEGLUDEC 100 UNIT/ML ~~LOC~~ SOPN: 10.0000 [IU] | PEN_INJECTOR | Freq: Every day | SUBCUTANEOUS | Status: DC

## 2016-07-09 MED ORDER — GLUCOSE BLOOD VI STRP: ORAL_STRIP | Status: DC

## 2016-07-09 MED ORDER — INSULIN PEN NEEDLE 31G X 5 MM MISC: Status: DC

## 2016-07-09 NOTE — Assessment & Plan Note (Signed)
Add on labs to hospital blood and urine

## 2016-07-09 NOTE — Telephone Encounter (Signed)
I spoke with lab staff Trace ketones SG 1.029 Urine glucose >500 I spoke with patient, explained ketones present and high glucose in urine Will talk to hospitalist to see if we can do a direct admit Hospitalist paged

## 2016-07-09 NOTE — Telephone Encounter (Signed)
I spoke with patient after talking to the lab; he will go up to the hospital for BMP and urine testing Still in process at this time

## 2016-07-09 NOTE — Addendum Note (Signed)
Addended by: LADA, Satira Anis on: 07/09/2016 04:04 PM   Modules accepted: Orders, Medications

## 2016-07-09 NOTE — Telephone Encounter (Signed)
The BMP does not show acidosis The urinalysis was cancelled I called the lab

## 2016-07-09 NOTE — Telephone Encounter (Signed)
See office visit addendum; patient seen in the office after his labs were resulted and I spoke to hospital who recommended against admission; did not meet criteria for DKA

## 2016-07-09 NOTE — Telephone Encounter (Signed)
Blood sugar 350 twice last night Light-headed when walking 1.6 miles Blood sugar 350 Took pill this morning I looked through his labs at the hospital I can't find his urine results; he said they collected a sample I'm going to call the lab and call him right back ---------------------------- I called the lab Urine was credited, not received; no urine to add on to check for ketones They can add on the GAD auto-antibodies, but they can't do the urine; order entered by me

## 2016-07-09 NOTE — Patient Instructions (Addendum)
Please do give yourself ten units of long-acting insulin once a day around 3 pm Test your blood sugars first thing in the morning before eating breakfast, again before lunch, and again before supper, and lastly before going to bed Feel free to check more often if symptomatic  If you get worse (fruity smell to your breath, worsening headache, abdominal pain, etc), then go to the ER  Short-term goal fasting is under 200 at the very least Long-term goal fasting is under 150 is better, under 140 is ideal Goal after meals (90-120 minutes after start of the meal) is under 200 at the very least, under 180 is better  Call if needed, [phone] Go to [facility] if needed I'll see you in August, but we'll talk on Monday or Tuesday  Diabetes Mellitus and Food It is important for you to manage your blood sugar (glucose) level. Your blood glucose level can be greatly affected by what you eat. Eating healthier foods in the appropriate amounts throughout the day at about the same time each day will help you control your blood glucose level. It can also help slow or prevent worsening of your diabetes mellitus. Healthy eating may even help you improve the level of your blood pressure and reach or maintain a healthy weight.  General recommendations for healthful eating and cooking habits include:  Eating meals and snacks regularly. Avoid going long periods of time without eating to lose weight.  Eating a diet that consists mainly of plant-based foods, such as fruits, vegetables, nuts, legumes, and whole grains.  Using low-heat cooking methods, such as baking, instead of high-heat cooking methods, such as deep frying. Work with your dietitian to make sure you understand how to use the Nutrition Facts information on food labels. HOW CAN FOOD AFFECT ME? Carbohydrates Carbohydrates affect your blood glucose level more than any other type of food. Your dietitian will help you determine how many  carbohydrates to eat at each meal and teach you how to count carbohydrates. Counting carbohydrates is important to keep your blood glucose at a healthy level, especially if you are using insulin or taking certain medicines for diabetes mellitus. Alcohol Alcohol can cause sudden decreases in blood glucose (hypoglycemia), especially if you use insulin or take certain medicines for diabetes mellitus. Hypoglycemia can be a life-threatening condition. Symptoms of hypoglycemia (sleepiness, dizziness, and disorientation) are similar to symptoms of having too much alcohol.  If your health care provider has given you approval to drink alcohol, do so in moderation and use the following guidelines:  Women should not have more than one drink per day, and men should not have more than two drinks per day. One drink is equal to:  12 oz of beer.  5 oz of wine.  1 oz of hard liquor.  Do not drink on an empty stomach.  Keep yourself hydrated. Have water, diet soda, or unsweetened iced tea.  Regular soda, juice, and other mixers might contain a lot of carbohydrates and should be counted. WHAT FOODS ARE NOT RECOMMENDED? As you make food choices, it is important to remember that all foods are not the same. Some foods have fewer nutrients per serving than other foods, even though they might have the same number of calories or carbohydrates. It is difficult to get your body what it needs when you eat foods with fewer nutrients. Examples of foods that you should avoid that are high in calories and carbohydrates but low in nutrients include:  Trans fats (  most processed foods list trans fats on the Nutrition Facts label).  Regular soda.  Juice.  Candy.  Sweets, such as cake, pie, doughnuts, and cookies.  Fried foods. WHAT FOODS CAN I EAT? Eat nutrient-rich foods, which will nourish your body and keep you healthy. The food you should eat also will depend on several factors, including:  The calories you  need.  The medicines you take.  Your weight.  Your blood glucose level.  Your blood pressure level.  Your cholesterol level. You should eat a variety of foods, including:  Protein.  Lean cuts of meat.  Proteins low in saturated fats, such as fish, egg whites, and beans. Avoid processed meats.  Fruits and vegetables.  Fruits and vegetables that may help control blood glucose levels, such as apples, mangoes, and yams.  Dairy products.  Choose fat-free or low-fat dairy products, such as milk, yogurt, and cheese.  Grains, bread, pasta, and rice.  Choose whole grain products, such as multigrain bread, whole oats, and brown rice. These foods may help control blood pressure.  Fats.  Foods containing healthful fats, such as nuts, avocado, olive oil, canola oil, and fish. DOES EVERYONE WITH DIABETES MELLITUS HAVE THE SAME MEAL PLAN? Because every person with diabetes mellitus is different, there is not one meal plan that works for everyone. It is very important that you meet with a dietitian who will help you create a meal plan that is just right for you.   This information is not intended to replace advice given to you by your health care provider. Make sure you discuss any questions you have with your health care provider.   Document Released: 09/08/2005 Document Revised: 01/02/2015 Document Reviewed: 11/08/2013 Elsevier Interactive Patient Education 2016 [employer]. How and Where to Give Subcutaneous Insulin Injections, Adult People with type 1 diabetes must take insulin since their bodies do not make it. People with type 2 diabetes may require insulin. There are many different types of insulin as well as other injectable diabetes medicines that are meant to be injected into the fat layer under your skin. The type of insulin or injectable diabetes medicine you take may determine how many injections you give yourself and when to take the injections.  CHOOSING A SITE FOR  INJECTION Insulin absorption varies from site to site. As with any injectable medication it is best for the insulin to be injected within the same body region. However, do not inject the insulin in the same spot each time. Rotating the spots you give your injections will prevent inflammation or tissue breakdown. There are four main regions that can be used for injections. The regions include the:  Abdomen (preferred region, especially for non-insulin injectable diabetes medicine).  Front and upper outer sides of thighs.  Back of upper arm.  Buttocks. USING A SYRINGE AND VIAL Drawing up insulin: single insulin dose  Wash your hands with soap and water.  Gently roll the insulin bottle (vial) between your hands to mix it. Do not shake the vial.  Clean the top rubber part of the vial with an alcohol wipe. Be sure that the plastic pop-top has been removed on newer vials.  Remove the plastic cover from the needle on the syringe. Do not let the needle touch anything.  Pull the plunger back to draw air into the syringe. The air should be the same amount as the insulin dose.  Push the needle through the rubber on the top of the vial. Do not turn the  vial over.  Push the plunger in all the way to put the air into the vial.  Leave the needle in the vial and turn the vial and syringe upside down.  Pull down slowly on the plunger, drawing the amount of insulin you need into the syringe.  Look for air bubbles in the syringe. You may need to push the plunger up and down 2 to 3 times to slowly get rid of any air bubbles in the syringe.  Pull back the plunger to get your correct dose.  Remove the needle from the vial.  Use an alcohol wipe to clean the area of the body to be injected.  Pinch up 1 inch of skin and hold it.  Put the needle straight into the skin (90-degree angle). Put the needle in as far as it will go (to the hub). The needle may need to be injected at a 45-degree angle in  small adults with little fat.  When the needle is in, you can let go of your skin.  Push the plunger down all the way to inject the insulin.  Pull the needle straight out of the skin.  Press the alcohol wipe over the spot where you gave your injection. Keep it there for a few seconds. Do not rub the area.  Do not put the plastic cover back on the needle. Drawing up insulin: mixing 2 insulins  Wash your hands with soap and water.  Gently roll the vial of "cloudy" insulin between your hands or rotate the vial from top to bottom to mix.  Clean the top of both vials with an alcohol wipe. Be sure that the plastic pop-top lid has been removed on newer vials.  Pull air into the syringe to equal the dose of "cloudy" insulin.  Stick the needle into the "cloudy" insulin vial and inject the air. Be sure to keep the vial upright.  Remove the needle from the "cloudy" insulin vial.  Pull air into the syringe to equal the dose of "clear" insulin.  Stick the needle into the "clear" insulin vial and inject the air.  Leave the needle in the "clear" insulin vial and turn the vial upside down.  Pull down on the plunger and slowly draw into the syringe the number of units of "clear" insulin desired.  Look for air bubbles in the syringe. You may need to push the plunger up and down 2 to 3 times to slowly get rid of any air bubbles in the syringe.  Remove the needle from the "clear" insulin vial.  Stick the needle into the "cloudy" insulin vial. Do not inject any of the "clear" insulin into the "cloudy" vial.  Turn the "cloudy" vial upside down and pull the plunger down to the number of units that equals the total number of units of "clear" and "cloudy" insulins.  Remove the needle from the "cloudy" insulin vial.  Use an alcohol wipe to clean the area of the body to be injected.  Put the needle straight into the skin (90-degree angle). Put the needle in as far as it will go (to the hub). The  needle may need to be injected at a 45-degree angle in small adults with little fat.  When the needle is in, you can let go of your skin.  Push the plunger down all the way to inject the insulin.  Pull the needle straight out of the skin.  Press the alcohol wipe over the spot where you gave your  injection. Keep it there for a few seconds. Do not rub the area.  Do not put the plastic cover back on the needle. USING INSULIN PENS  Wash your hands with soap and water.  If you are using the "cloudy" insulin, roll the pen between your palms several times or rotate the pen top to bottom several times.  Remove the insulin pen cap.  Clean the rubber stopper of the cartridge with an alcohol wipe.  Remove the protective paper tab from the disposable needle.  Screw the needle onto the pen.  Remove the outer plastic needle cover.  Remove the inner plastic needle cover.  Prime the insulin pen by turning the button (dial) to 2 units. Hold the pen with the needle pointing up, and push the dial on the opposite end until a drop of insulin appears at the needle tip. If no insulin appears, repeat this step.  Dial the number of units of insulin you will inject.  Use an alcohol wipe to clean the area of the body to be injected.  Pinch up 1 inch of skin and hold it.  Put the needle straight into the skin (90-degree angle).  Push the dial down to push the insulin into the fat tissue.  Count to 10 slowly. Then, remove the needle from the fat tissue.  Carefully replace the larger outer plastic needle cover over the needle and unscrew the capped needle. THROWING AWAY SUPPLIES  Discard used needles in a puncture proof sharps disposal container. Follow disposal regulations for the area where you live.  Vials and empty disposable pens may be thrown away in the regular trash.   This information is not intended to replace advice given to you by your health care provider. Make sure you discuss any  questions you have with your health care provider.   Document Released: 03/03/2004 Document Revised: 01/02/2015 Document Reviewed: 05/21/2013 Elsevier Interactive Patient Education 2016 [employer]. Insulin Treatment for Diabetes Diabetes is a disease that does not go away (chronic). It occurs when the body does not properly use the sugar (glucose) that is released from food after it is digested. Glucose levels are controlled by a hormone called insulin, which is made by your pancreas. Depending on the type of diabetes you have, either of the following will apply:   The pancreas does not make any insulin (type 1 diabetes).  The pancreas makes too little insulin, and the body cannot respond normally to the insulin that is made (type 2 diabetes). Without insulin, death can occur. However, with the addition of insulin, blood sugar monitoring, and treatment, someone with diabetes can live a full and productive life. This document will discuss the role of insulin in your treatment and provide information about its use.  HOW IS INSULIN GIVEN? Insulin is a medicine that can only be given by injection. Taking it by mouth makes it inactive because of the acid in your stomach. Insulin is injected under the skin by a syringe and needle, an insulin pen, a pump, or a jet injector. Your dose will be determined by your health care provider based on your individual needs. You will also be given guidance on which method of giving insulin is right for you. Remember that if you give insulin with a needle and syringe, you must do so using only a special insulin syringe made for this purpose. WHERE ON THE BODY SHOULD INSULIN BE INJECTED? Insulin is injected into the fatty layer of tissue just under your skin. Good  places to inject insulin include the upper arm, the front and outer area of the thigh, the hips, and the abdomen. Giving your insulin in the abdomen is preferred because this provides the most rapid and  consistent absorption. Avoid the area 2 inches (5 cm) around the navel and avoid injecting into areas on your body with scar tissue. In addition, it is important to rotate your injection sites with every shot to prevent irritation and improve absorption. WHAT ARE THE DIFFERENT TYPES OF INSULIN?  If you have type 1 diabetes, you must take insulin to stay alive. Your body does not produce it. If you have type 2 diabetes, you might require insulin in addition to, or instead of, other medicines. In either case, proper use of insulin is critical to control your diabetes.  There are a number of different types of insulin. Usually, you will give yourself injections, though others can be trained to give them to you. Some people have an insulin pump that delivers insulin continuously through a tube (cannula) that is placed under the skin. Using insulin requires that you check your blood sugar several times a day. The exact number of times and time of day to check will vary depending on your type of diabetes, your type of insulin, and treatment goals. Your health care provider will direct you.  Generally, different insulins have different properties. The following is a general guide. Specifics will vary by product, and new products are introduced periodically.   Rapid-acting insulin starts working quickly (in as little as 5 minutes) and wears off in 4 to 6 hours (sometimes longer). This type of insulin works well when taken just before a meal to bring your blood sugar quickly back to normal.   Short-acting insulin starts working in about 30 minutes and can last 6 to 10 hours. This type of insulin should be taken about 30 minutes before you start eating a meal.  Intermediate-acting insulin starts working in 1-2 hours and wears off after about 10 to 18 hours. This insulin will lower your blood sugar for a longer period of time, but it will not be as effective in lowering your blood sugar right after a meal.    Long-acting insulin mimics the small amount of insulin that your pancreas usually produces throughout the day. You need to have some insulin present at all times. It is crucial to the metabolism of brain cells and other cells. Long-acting insulin is meant to be used either once or twice a day. It is usually used in combination with other types of insulin, or in combination with other diabetes medicines.  Discuss the type of insulin you are taking with your health care provider or pharmacist. You will then be aware of when the insulin can be expected to peak and when it will wear off. This is important to know so you can plan for meal times and periods of exercise.  Your health care provider will usually have a strategy in mind when treating you with insulin. This will vary with your type of diabetes, your diabetes treatment goals, and your health history. It is important that you understand this strategy so you can be an active partner in treating your diabetes. Here are some terms you might hear:   Basal insulin. This refers to the small amount of insulin that needs to be present in your blood at all times. Sometimes oral medicines will be enough. For other people, and especially for people with type 1 diabetes, insulin  is needed. Usually, intermediate-acting or long-acting insulin is used once or twice a day to accomplish this.   Prandial (meal-related) insulin. Your blood sugar will rise rapidly after a meal. Rapid-acting or short-acting insulin can be used right before the meal to bring your blood sugar back to normal quickly. You might be instructed to adjust the amount of insulin depending on how much carbohydrate (starch) is in your meal.   Corrective insulin. You might be instructed to check your blood sugar at certain times of the day. You then might use a small amount of rapid-acting or short-acting insulin to bring the blood sugar down to normal if it is elevated.   Tight control (also  called intensive therapy). Tight control means keeping your blood sugar as close to your target as possible and keeping it from going too high after meals. People with tight control of their diabetes are shown to have fewer long-term problems from their diabetes.   Glycohemoglobin (also called glyco, glycosylated hemoglobin, hemoglobin A1c, or A1c) level. This measures how well your blood sugar has been controlled during the past 1 to 3 months. It helps your health care provider see how effective your treatment is and decide if any changes are needed. Your health care provider will discuss your target glycohemoglobin level with you.  Insulin treatment requires your careful attention. While you are being treated with insulin, you should check your blood glucose at least two times each day. Treatment plans will be different for different people. Some people do well with a simple program. Others require more complicated programs, with multiple insulin injections daily. You will work with your health care provider to develop the best program for you. Regardless of your insulin treatment plan, you must also do your best on weight control, diet and food choices, exercise, blood pressure control, cholesterol control, and stress levels.  WHAT ARE THE SIDE EFFECTS OF INSULIN? Although insulin treatment is important, it does have some side effects, such as:   Insulin can cause your blood sugar to go too low (hypoglycemia).   Weight gain can occur.   Improper injection technique can cause hypoglycemia, blood sugar to go too high (hyperglycemia), skin injury or irritation, or other problems. You must learn to inject insulin properly.   This information is not intended to replace advice given to you by your health care provider. Make sure you discuss any questions you have with your health care provider.   Document Released: 03/10/2009 Document Revised: 01/02/2015 Document Reviewed: 05/26/2013 Elsevier  Interactive Patient Education 2016 [employer]. Diabetic Ketoacidosis Diabetic ketoacidosis is a life-threatening complication of diabetes. If it is not treated, it can cause severe dehydration and organ damage and can lead to a coma or death. CAUSES This condition develops when there is not enough of the hormone insulin in the body. Insulin helps the body to break down sugar for energy. Without insulin, the body cannot break down sugar, so it breaks down fats instead. This leads to the production of acids that are called ketones. Ketones are poisonous at high levels. This condition can be triggered by:  Stress on the body that is brought on by an illness.  Medicines that raise blood glucose levels.  Not taking diabetes medicine. SYMPTOMS Symptoms of this condition include:  Fatigue.  Weight loss.  Excessive thirst.  Light-headedness.  Fruity or sweet-smelling breath.  Excessive urination.  Vision changes.  Confusion or irritability.  Nausea.  Vomiting.  Rapid breathing.  Abdominal pain.  Feeling flushed.  DIAGNOSIS This condition is diagnosed based on a medical history, a physical exam, and blood tests. You may also have a urine test that checks for ketones. TREATMENT This condition may be treated with:  Fluid replacement. This may be done to correct dehydration.  Insulin injections. These may be given through the skin or through an IV tube.  Electrolyte replacement. Electrolytes, such as potassium and sodium, may be given in pill form or through an IV tube.  Antibiotic medicines. These may be prescribed if your condition was caused by an infection. HOME CARE INSTRUCTIONS Eating and Drinking  Drink enough fluids to keep your urine clear or pale yellow.  If you cannot eat, alternate between drinking fluids with sugar (such as juice) and salty fluids (such as broth or bouillon).  If you can eat, follow your usual diet and drink sugar-free liquids, such as  water. Other Instructions  Take insulin as directed by your health care provider. Do not skip insulin injections. Do not use expired insulin.  If your blood sugar is over 240 mg/dL, monitor your urine ketones every 4-6 hours.  If you were prescribed an antibiotic medicine, finish all of it even if you start to feel better.  Rest and exercise only as directed by your health care provider.  If you get sick, call your health care provider and begin treatment quickly. Your body often needs extra insulin to fight an illness.  Check your blood glucose levels regularly. If your blood glucose is high, drink plenty of fluids. This helps to flush out ketones. SEEK MEDICAL CARE IF:  Your blood glucose level is too high or too low.  You have ketones in your urine.  You have a fever.  You cannot eat.  You cannot tolerate fluids.  You have been vomiting for more than 2 hours.  You continue to have symptoms of this condition.  You develop new symptoms. SEEK IMMEDIATE MEDICAL CARE IF:  Your blood glucose levels continue to be high (elevated).  Your monitor reads "high" even when you are taking insulin.  You faint.  You have chest pain.  You have trouble breathing.  You have a sudden, severe headache.  You have sudden weakness in one arm or one leg.  You have sudden trouble speaking or swallowing.  You have vomiting or diarrhea that gets worse after 3 hours.  You feel severely fatigued.  You have trouble thinking.  You have abdominal pain.  You are severely dehydrated. Symptoms of severe dehydration include:  Extreme thirst.  Dry mouth.  Blue lips.  Cold hands and feet.  Rapid breathing.   This information is not intended to replace advice given to you by your health care provider. Make sure you discuss any questions you have with your health care provider.   Document Released: 12/09/2000 Document Revised: 04/28/2015 Document Reviewed: 11/19/2014 Elsevier  Interactive Patient Education [employer].

## 2016-07-11 ENCOUNTER — Ambulatory Visit
Admission: EM | Admit: 2016-07-11 | Discharge: 2016-07-11 | Disposition: A | Discharge status: Home / Self Care | Payer: Managed Care, Other (non HMO) | Attending: Family Medicine | Admitting: Family Medicine

## 2016-07-11 ENCOUNTER — Telehealth: Payer: Self-pay | Admitting: Family Medicine

## 2016-07-11 DIAGNOSIS — G4489 Other headache syndrome: Secondary | ICD-10-CM | POA: Diagnosis not present

## 2016-07-11 DIAGNOSIS — E1165 Type 2 diabetes mellitus with hyperglycemia: Secondary | ICD-10-CM | POA: Diagnosis not present

## 2016-07-11 DIAGNOSIS — R110 Nausea: Secondary | ICD-10-CM

## 2016-07-11 LAB — BASIC METABOLIC PANEL
Anion gap: 7 (ref 5–15)
BUN: 15 mg/dL (ref 6–20)
CALCIUM: 9 mg/dL (ref 8.9–10.3)
CO2: 27 mmol/L (ref 22–32)
Chloride: 99 mmol/L — ABNORMAL LOW (ref 101–111)
Creatinine, Ser: 0.91 mg/dL (ref 0.61–1.24)
GFR calc Af Amer: >60 mL/min (ref >60)
GLUCOSE: 303 mg/dL — AB (ref 65–99)
Potassium: 4 mmol/L (ref 3.5–5.1)
Sodium: 133 mmol/L — ABNORMAL LOW (ref 135–145)

## 2016-07-11 LAB — URINALYSIS COMPLETE WITH MICROSCOPIC (ARMC ONLY)
Bacteria, UA: NONE SEEN
Bilirubin Urine: NEGATIVE
Glucose, UA: >1000 mg/dL — AB
HGB URINE DIPSTICK: NEGATIVE
Ketones, ur: 15 mg/dL — AB
LEUKOCYTES UA: NEGATIVE
NITRITE: NEGATIVE
PH: 5 (ref 5.0–8.0)
PROTEIN: NEGATIVE mg/dL
RBC / HPF: NONE SEEN RBC/hpf (ref 0–5)
SPECIFIC GRAVITY, URINE: 1.01 (ref 1.005–1.030)
Squamous Epithelial / LPF: NONE SEEN

## 2016-07-11 LAB — MISC LABCORP TEST (SEND OUT): LABCORP TEST CODE: 1453

## 2016-07-11 LAB — GLUCOSE, CAPILLARY
GLUCOSE-CAPILLARY: 253 mg/dL — AB (ref 65–99)
Glucose-Capillary: 256 mg/dL — ABNORMAL HIGH (ref 65–99)

## 2016-07-11 LAB — GLUTAMIC ACID DECARBOXYLASE AUTO ABS: Glutamic Acid Decarb Ab: <5 U/mL (ref 0.0–5.0)

## 2016-07-11 MED ORDER — KETOROLAC TROMETHAMINE 60 MG/2ML IM SOLN
60.0000 mg | Freq: Once | INTRAMUSCULAR | Status: AC
  Administered 2016-07-11: 60 mg via INTRAMUSCULAR

## 2016-07-11 MED ORDER — SODIUM CHLORIDE 0.9 % IV SOLN
Freq: Once | INTRAVENOUS | Status: AC
  Administered 2016-07-11: 999 mL via INTRAVENOUS

## 2016-07-11 MED ORDER — ONDANSETRON 8 MG PO TBDP: 8.0000 mg | ORAL_TABLET | Freq: Two times a day (BID) | ORAL | Status: DC

## 2016-07-11 MED ORDER — IBUPROFEN 800 MG PO TABS: 800.0000 mg | ORAL_TABLET | Freq: Three times a day (TID) | ORAL | Status: DC

## 2016-07-11 NOTE — Telephone Encounter (Signed)
Pt notified and agreed

## 2016-07-11 NOTE — Telephone Encounter (Signed)
I don't like to hear this, unbearable headaches and nausea; please ask him to go to urgent care at [facility] and they can recheck him for ketones and check a glucose; thank you

## 2016-07-11 NOTE — Telephone Encounter (Signed)
Having severe headaches (almost unbearable) was diagnosed with DM last week. Yesterday morning for about 15-20 minutes patient became very nausea but did not vomit and it did go away. The headaches does not go away even after taking medications. Please advise

## 2016-07-11 NOTE — ED Provider Notes (Signed)
CSN: [id number]     Arrival date & time 07/11/16  1355 History   None    Chief Complaint  Patient presents with  . Headache   (Consider location/radiation/quality/duration/timing/severity/associated sxs/prior Treatment) HPI Comments: 45 yo male with a c/o headaches associated with nausea for the past 3 days. Was diagnosed with diabetes 3 days ago and started on insulin. Patient denies any vomiting, diarrhea, fevers, chills, abdominal pain, numbness/tingling.   The history is provided by the patient.    Past Medical History  Diagnosis Date  . Gout   . IBS (irritable bowel syndrome)   . Angioedema   . Seasonal allergies   . GERD (gastroesophageal reflux disease)   . Hypertension     CONTROLLED ON MEDS  . HTN (hypertension)    Past Surgical History  Procedure Laterality Date  . Shoulder surgery Left 2007  . Nasal septum surgery  2004  . Acdf with fusion  2007  . Testicular torsion  1980s  . Vasectomy    . Colonoscopy with propofol N/A 06/26/2015    Procedure: COLONOSCOPY WITH PROPOFOL;  Surgeon: Lucilla Lame, MD;  Location: [facility];  Service: Endoscopy;  Laterality: N/A;  with biopsies   Family History  Problem Relation Age of Onset  . Breast cancer Mother   . Skin cancer Mother   . Arrhythmia Mother     A-fib  . Cancer Mother     breast, colon?, skin  . Hypertension Father   . Cancer Maternal Grandfather   . Diabetes Neg Hx   . Heart disease Neg Hx   . Stroke Neg Hx   . COPD Neg Hx    Social History  Substance Use Topics  . Smoking status: Former Smoker -- 1.00 packs/day for 10 years    Types: Cigarettes    Quit date: 04/15/2015  . Smokeless tobacco: Never Used  . Alcohol Use: No     Comment: Rare consumption    Review of Systems  Allergies  Review of patient's allergies indicates no known allergies.  Home Medications   Prior to Admission medications   Medication Sig Start Date End Date Taking? Authorizing Provider  allopurinol (ZYLOPRIM) 100  MG tablet Take 100 mg by mouth daily.   Yes Historical Provider, MD  atenolol (TENORMIN) 50 MG tablet Take 50 mg by mouth daily.   Yes Historical Provider, MD  belladonna-PHENObarbital (DONNATAL) 16.2 MG/5ML ELIX Take 5 mLs (16.2 mg total) by mouth 4 (four) times daily as needed for cramping or pain. 05/28/15  Yes Lucilla Lame, MD  cetirizine (ZYRTEC) 10 MG tablet Take 10 mg by mouth daily.   Yes Historical Provider, MD  colchicine 0.6 MG tablet Take 0.6 mg by mouth daily as needed.   Yes Historical Provider, MD  diphenhydrAMINE (BENADRYL) 25 mg capsule Take 25 mg by mouth 2 (two) times daily.   Yes Historical Provider, MD  EPINEPHrine (EPIPEN 2-PAK) 0.3 mg/0.3 mL IJ SOAJ injection Inject 0.3 mg into the muscle once.   Yes Historical Provider, MD  glucose blood (ACCU-CHEK ACTIVE STRIPS) test strip Use as instructed 07/09/16  Yes Arnetha Courser, MD  Insulin Degludec (TRESIBA FLEXTOUCH) 100 UNIT/ML SOPN Inject 10 Units into the skin daily at 3 pm. 07/09/16  Yes Arnetha Courser, MD  Insulin Pen Needle (B-D UF III MINI PEN NEEDLES) 31G X 5 MM MISC Once a day with insulin 07/09/16  Yes Arnetha Courser, MD  Melatonin 5 MG TABS Take 5 mg by mouth at bedtime.  Yes Historical Provider, MD  ranitidine (ZANTAC) 150 MG capsule Take 150 mg by mouth daily.   Yes Historical Provider, MD  ibuprofen (ADVIL,MOTRIN) 800 MG tablet Take 1 tablet (800 mg total) by mouth 3 (three) times daily. 07/11/16   Norval Gable, MD  ondansetron (ZOFRAN ODT) 8 MG disintegrating tablet Take 1 tablet (8 mg total) by mouth 2 (two) times daily. 07/11/16   Norval Gable, MD   Meds Ordered and Administered this Visit   Medications  ketorolac (TORADOL) injection 60 mg (60 mg Intramuscular Given 07/11/16 1533)  0.9 %  sodium chloride infusion ( Intravenous Stopped 07/11/16 1710)    BP 126/87 mmHg  Pulse 81  Temp(Src) 98.4 F (36.9 C) (Oral)  Resp 17  Ht 6' (1.829 m)  Wt 215 lb (97.523 kg)  BMI 29.15 kg/m2  SpO2 99% No data  found.   Physical Exam  Constitutional: He is oriented to person, place, and time. He appears well-developed and well-nourished. No distress.  HENT:  Head: Normocephalic and atraumatic.  Right Ear: External ear normal.  Left Ear: External ear normal.  Nose: Nose normal.  Mouth/Throat: Uvula is midline, oropharynx is clear and moist and mucous membranes are normal.  Eyes: Conjunctivae and EOM are normal. Pupils are equal, round, and reactive to light. Right eye exhibits no discharge. Left eye exhibits no discharge. No scleral icterus.  Neck: Normal range of motion. Neck supple. No tracheal deviation present. No thyromegaly present.  Cardiovascular: Normal rate, regular rhythm and normal heart sounds.   Pulmonary/Chest: Effort normal and breath sounds normal. No stridor. No respiratory distress. He has no wheezes. He has no rales. He exhibits no tenderness.  Abdominal: Soft. Bowel sounds are normal. He exhibits no distension and no mass. There is no tenderness. There is no rebound and no guarding.  Lymphadenopathy:    He has no cervical adenopathy.  Neurological: He is alert and oriented to person, place, and time. No cranial nerve deficit.  Skin: Skin is warm and dry. No rash noted. He is not diaphoretic.  Nursing note and vitals reviewed.   ED Course  Procedures (including critical care time)  Labs Review Labs Reviewed  URINALYSIS COMPLETEWITH MICROSCOPIC ([facility] ONLY) - Abnormal; Notable for the following:    Color, Urine STRAW (*)    Glucose, UA >1000 (*)    Ketones, ur 15 (*)    All other components within normal limits  GLUCOSE, CAPILLARY - Abnormal; Notable for the following:    Glucose-Capillary 253 (*)    All other components within normal limits  BASIC METABOLIC PANEL - Abnormal; Notable for the following:    Sodium 133 (*)    Chloride 99 (*)    Glucose, Bld 303 (*)    All other components within normal limits  GLUCOSE, CAPILLARY - Abnormal; Notable for the following:     Glucose-Capillary 256 (*)    All other components within normal limits  CBG MONITORING, ED    Imaging Review No results found.   Visual Acuity Review  Right Eye Distance:   Left Eye Distance:   Bilateral Distance:    Right Eye Near:   Left Eye Near:    Bilateral Near:         MDM   1. Other headache syndrome   2. Uncontrolled type 2 diabetes mellitus with hyperglycemia, unspecified long term insulin use status ([place])   3. Nausea       Discharge Medication List as of 07/11/2016  5:24 PM  START taking these medications   Details  ibuprofen (ADVIL,MOTRIN) 800 MG tablet Take 1 tablet (800 mg total) by mouth 3 (three) times daily., Starting 07/11/2016, Until Discontinued, Print    ondansetron (ZOFRAN ODT) 8 MG disintegrating tablet Take 1 tablet (8 mg total) by mouth 2 (two) times daily., Starting 07/11/2016, Until Discontinued, Print       1. Lab results and diagnosis reviewed with patient 2. Patient given 1L IVF NS, zofran 8mg  odt x1 and toradol 60mg  IM x1 with improvement of symptoms, resolution of headache 3. rx as per orders above; reviewed possible side effects, interactions, risks and benefits  3. Recommend supportive treatment with increased fluids 4. Follow-up with PCP as scheduled regarding diabetes management    Norval Gable, MD 07/11/16 2028

## 2016-07-11 NOTE — ED Notes (Signed)
Patient was newly diagnosed with Diabetes Type 1 on Friday. Patient states that headaches have been constant since Saturday. Patient current sees Dr. Sanda Klein whom wanted him to come here today to have his blood sugar checked and ketones. Patient states that headaches have been unbearable. Patient states that headache does not respond to ibuprofen and Tylenol.

## 2016-07-12 ENCOUNTER — Telehealth: Payer: Self-pay | Admitting: Family Medicine

## 2016-07-12 ENCOUNTER — Ambulatory Visit: Payer: Managed Care, Other (non HMO) | Admitting: Family Medicine

## 2016-07-12 DIAGNOSIS — IMO0002 Reserved for concepts with insufficient information to code with codable children: Secondary | ICD-10-CM

## 2016-07-12 DIAGNOSIS — R824 Acetonuria: Secondary | ICD-10-CM | POA: Insufficient documentation

## 2016-07-12 DIAGNOSIS — R81 Glycosuria: Secondary | ICD-10-CM | POA: Insufficient documentation

## 2016-07-12 DIAGNOSIS — E1065 Type 1 diabetes mellitus with hyperglycemia: Secondary | ICD-10-CM

## 2016-07-12 NOTE — Telephone Encounter (Signed)
I spoke with patient; he went back to urgent care yesterday and they did labs; reviewed, >1000 glucose urine, 15 ketones urine and that was on insulin x 2 days GAD antibodies negative, other testing outside my scope of practice; calling endocrinologist I am not prepared to call him type 2 diabetes; he is still presenting like type 1 or 1.5 perhaps, but I will ask for help with management In the meantime, increase Tresiba to 13 units once a day and someone should call him soon -------------------------- I spoke with staff at endocrinology office; asked them to please pass along number, asking for high-priority referral

## 2016-07-12 NOTE — Assessment & Plan Note (Signed)
Check another antibody

## 2016-07-12 NOTE — Telephone Encounter (Signed)
Pt would like to FU with you about his visit from Urgent Care. Please return his call. [phone]

## 2016-07-13 ENCOUNTER — Telehealth: Payer: Self-pay | Admitting: Family Medicine

## 2016-07-13 NOTE — Telephone Encounter (Signed)
I called endo office; spoke with staff there; they did receive referral yesterday and he's checking to see if doctor approved it yet He said they should be looking at that today; I thanked him and explained patient and I are both eager to have him see endocrinologist with possibility of new type 1 diabetes; will greatly appreciate their assistance

## 2016-07-13 NOTE — Telephone Encounter (Signed)
On-call contacted me, office is closed today due to circumstances beyond our control Sugars are better but still high; breakfast pre 235 and post 255 Lunch pre 263 and post 298 Tresiba 14 units once daily every afternoon Patient hasn't heard anything about endo appt Headache; try Tylenol and ibuprofen I'll contact endocrinology office again

## 2016-07-14 ENCOUNTER — Telehealth: Payer: Self-pay

## 2016-07-14 NOTE — Telephone Encounter (Signed)
Got call from call a nurse from yesterday pt went to urgent care for Blood glucose and seems worse yesterday was 293 and having more headaches?

## 2016-07-14 NOTE — Telephone Encounter (Signed)
Please call patient to follow-up, because I talked to call-a-nurse personally yesterday afternoon I spoke with him yesterday afternoon, but I don't see any results from urgent care in the system I just communicated with wife too by MyChart today (it's under her name, not his though, so I'm not sure how to correct that since she sent the note) Please call them and see if any needs; they should be seeing endo very soon

## 2016-07-22 ENCOUNTER — Encounter: Payer: Managed Care, Other (non HMO) | Admitting: Family Medicine

## 2016-07-23 ENCOUNTER — Other Ambulatory Visit: Payer: Self-pay | Admitting: Family Medicine

## 2016-08-01 ENCOUNTER — Encounter: Payer: Self-pay | Admitting: Family Medicine

## 2016-08-01 ENCOUNTER — Encounter: Payer: Managed Care, Other (non HMO) | Attending: Family Medicine | Admitting: Dietician

## 2016-08-01 ENCOUNTER — Encounter: Payer: Self-pay | Admitting: Dietician

## 2016-08-01 ENCOUNTER — Ambulatory Visit (INDEPENDENT_AMBULATORY_CARE_PROVIDER_SITE_OTHER): Payer: Managed Care, Other (non HMO) | Admitting: Family Medicine

## 2016-08-01 VITALS — BP 122/64 | HR 94 | Temp 98.4°F | Resp 14 | Wt 221.0 lb

## 2016-08-01 VITALS — BP 112/72 | Ht 72.0 in | Wt 221.5 lb

## 2016-08-01 DIAGNOSIS — E1065 Type 1 diabetes mellitus with hyperglycemia: Secondary | ICD-10-CM

## 2016-08-01 DIAGNOSIS — Z713 Dietary counseling and surveillance: Secondary | ICD-10-CM | POA: Diagnosis present

## 2016-08-01 DIAGNOSIS — IMO0002 Reserved for concepts with insufficient information to code with codable children: Secondary | ICD-10-CM

## 2016-08-01 DIAGNOSIS — E119 Type 2 diabetes mellitus without complications: Secondary | ICD-10-CM

## 2016-08-01 DIAGNOSIS — Z6830 Body mass index (BMI) 30.0-30.9, adult: Secondary | ICD-10-CM | POA: Diagnosis not present

## 2016-08-01 DIAGNOSIS — Z0000 Encounter for general adult medical examination without abnormal findings: Secondary | ICD-10-CM | POA: Diagnosis not present

## 2016-08-01 DIAGNOSIS — I10 Essential (primary) hypertension: Secondary | ICD-10-CM

## 2016-08-01 MED ORDER — CYCLOBENZAPRINE HCL 10 MG PO TABS: 10.0000 mg | ORAL_TABLET | Freq: Three times a day (TID) | ORAL | 2 refills | Status: DC | PRN

## 2016-08-01 NOTE — Progress Notes (Signed)
Patient ID: Ege Mavity, male   DOB: 08-23-1971, 45 y.o.   MRN: [id number]   Subjective:   John Hartman is a 45 y.o. male here for a complete physical exam  Interim issues since last visit: he has been diagnosed with diabetes, has been seen by endocrinologist; headache is gone, just on occasion; drinking plenty of liquids, staying hydrated; drinking Propel water; taking metformin and insulin 20 units daily; vision is coming back, less blurry; islet cell antibodies negative; my previous GAD antibodies were also negative, so endo believes he has type 2 diabetes; he is hopeful he'll be able to come off of insulin, and is working hard on his diet  USPSTF grade A and B recommendations Alcohol: no Depression: Depression screen [facility] 2/9 08/01/2016 08/01/2016  Decreased Interest 0 0  Down, Depressed, Hopeless 0 0  PHQ - 2 Score 0 0    Hypertension: excellent Obesity: recent weight loss associated with new onset diabetes, and he has gained a little back (some was dehydrated), and he is on insulin Tobacco use: no HIV, hep B, hep C: declined STD testing and prevention (chl/gon/syphilis): declined Lipids: will check when patient gets labs next, either with endo or with me Glucose: see above, coming down Colorectal cancer: n/a Breast cancer: no lumps Lung cancer: n/a Osteoporosis: n/a AAA: n/a Aspirin: at age 73 Diet: he is working really hard on his diet Exercise: he is active and exercising regularly now  Always has back  Issues; would like muscle relaxers to have on hand and use just when needed; tightens up and almost impossible to relax on its own; deteriorating lumbar disc, not to level of surgery, discs are getting thin  Past Medical History:  Diagnosis Date  . Angioedema   . DDD (degenerative disc disease), cervical   . GERD (gastroesophageal reflux disease)   . Gout   . HTN (hypertension)   . Hypertension    CONTROLLED ON MEDS  . IBS (irritable bowel syndrome)   . Seasonal allergies    . Spinal stenosis    Past Surgical History:  Procedure Laterality Date  . ACDF with fusion  2007  . COLONOSCOPY WITH PROPOFOL N/A 06/26/2015   Procedure: COLONOSCOPY WITH PROPOFOL;  Surgeon: Lucilla Lame, MD;  Location: [facility];  Service: Endoscopy;  Laterality: N/A;  with biopsies  . NASAL SEPTUM SURGERY  2004  . SHOULDER SURGERY Left 2007  . Testicular torsion  1980s  . VASECTOMY     Family History  Problem Relation Age of Onset  . Breast cancer Mother   . Skin cancer Mother   . Arrhythmia Mother     A-fib  . Cancer Mother     breast, colon?, skin  . Hypertension Father   . Cancer Maternal Grandfather   . Diabetes Neg Hx   . Heart disease Neg Hx   . Stroke Neg Hx   . COPD Neg Hx    Social History  Substance Use Topics  . Smoking status: Former Smoker    Packs/day: 1.00    Years: 10.00    Types: Cigarettes    Quit date: 04/15/2015  . Smokeless tobacco: Never Used  . Alcohol use No     Comment: Rare consumption   Review of Systems  Objective:   Vitals:   08/01/16 1336  BP: 122/64  Pulse: 94  Resp: 14  Temp: 98.4 F (36.9 C)  TempSrc: Oral  SpO2: 96%  Weight: 221 lb (100.2 kg)   Body mass index is  29.97 kg/m. Wt Readings from Last 3 Encounters:  08/01/16 221 lb 8 oz (100.5 kg)  08/01/16 221 lb (100.2 kg)  07/11/16 215 lb (97.5 kg)   Physical Exam  Constitutional: He appears well-developed and well-nourished. No distress.  HENT:  Head: Normocephalic and atraumatic.  Nose: Nose normal.  Mouth/Throat: Oropharynx is clear and moist.  Eyes: EOM are normal. No scleral icterus.  Neck: No JVD present. No thyromegaly present.  Cardiovascular: Normal rate, regular rhythm and normal heart sounds.   Pulmonary/Chest: Effort normal and breath sounds normal. No respiratory distress. He has no wheezes. He has no rales.  Abdominal: Soft. Bowel sounds are normal. He exhibits no distension. There is no tenderness. There is no guarding.  Musculoskeletal:  Normal range of motion. He exhibits no edema.  Lymphadenopathy:    He has no cervical adenopathy.  Neurological: He is alert. He displays normal reflexes. He exhibits normal muscle tone. Coordination normal.  Skin: Skin is warm and dry. No rash noted. He is not diaphoretic. No erythema. No pallor.  Psychiatric: He has a normal mood and affect. His behavior is normal. Judgment and thought content normal.   Assessment/Plan:   Problem List Items Addressed This Visit      Cardiovascular and Mediastinum   Essential hypertension    I explained that endo may choose to decrease his beta-blocker and put him on a low dose ACE-I or ARB, but I will respectfully defer that to her        Endocrine   New onset type 1 diabetes mellitus, uncontrolled ([place])    May be type 2 per endo; defer treatment and medicines to her; so appreciative of her seeing patient and getting sugars under control; we reviewed his glucometer readings today and I am thrilled, as I am sure she will be; he is hopeful he can decrease insulin soon; I explained that his lipid panel will likely be off since his sugars have been so out of control lately; I am happy to check his lipids in the next 3 months when he gets his A1c checked again, so endo may draw this, either way; he will likely benefit from a statin; so glad he is working so hard on his diet; he sees diabetic educator today; patient declined offer for foot exam today      Relevant Medications   metFORMIN (GLUCOPHAGE-XR) 500 MG 24 hr tablet     Other   Preventative health care    USPSTF grade A and B recommendations reviewed with patient; age-appropriate recommendations, preventive care, screening tests, etc discussed and encouraged; healthy living encouraged; see AVS for patient education given to patient       Other Visit Diagnoses   None.     Meds ordered this encounter  Medications  . metFORMIN (GLUCOPHAGE-XR) 500 MG 24 hr tablet    Sig: Take 500 mg by mouth  daily.   . cyclobenzaprine (FLEXERIL) 10 MG tablet    Sig: Take 1 tablet (10 mg total) by mouth every 8 (eight) hours as needed for muscle spasms.    Dispense:  30 tablet    Refill:  2   No orders of the defined types were placed in this encounter.   Follow up plan: Return in about 1 year (around 08/01/2017) for complete physical.  An After Visit Summary was printed and given to the patient.

## 2016-08-01 NOTE — Patient Instructions (Addendum)
I suspect that your endocrinologist may decrease your beta-blocker and add another medicine to help protect your kidneys I will be glad to check your lipid panel if she does not Try to limit saturated fats in your diet (bologna, hot dogs, barbeque, cheeseburgers, hamburgers, steak, bacon, sausage, cheese, etc.) and get more fresh fruits, vegetables, and whole grains Goal LDL is less than 100, goal HDL is over 40, goal TG is less than 150 I am thrilled with how you are doing in regards to your diabetes Colonoscopies every five years Start aspirin at age 74 unless directed otherwise by Dr. Gabriel Carina  Health Maintenance, Male A healthy lifestyle and preventative care can promote health and wellness.  Maintain regular health, dental, and eye exams.  Eat a healthy diet. Foods like vegetables, fruits, whole grains, low-fat dairy products, and lean protein foods contain the nutrients you need and are low in calories. Decrease your intake of foods high in solid fats, added sugars, and salt. Get information about a proper diet from your health care provider, if necessary.  Regular physical exercise is one of the most important things you can do for your health. Most adults should get at least 150 minutes of moderate-intensity exercise (any activity that increases your heart rate and causes you to sweat) each week. In addition, most adults need muscle-strengthening exercises on 2 or more days a week.   Maintain a healthy weight. The body mass index (BMI) is a screening tool to identify possible weight problems. It provides an estimate of body fat based on height and weight. Your health care provider can find your BMI and can help you achieve or maintain a healthy weight. For males 20 years and older:  A BMI below 18.5 is considered underweight.  A BMI of 18.5 to 24.9 is normal.  A BMI of 25 to 29.9 is considered overweight.  A BMI of 30 and above is considered obese.  Maintain normal blood lipids and  cholesterol by exercising and minimizing your intake of saturated fat. Eat a balanced diet with plenty of fruits and vegetables. Blood tests for lipids and cholesterol should begin at age 87 and be repeated every 5 years. If your lipid or cholesterol levels are high, you are over age 35, or you are at high risk for heart disease, you may need your cholesterol levels checked more frequently.Ongoing high lipid and cholesterol levels should be treated with medicines if diet and exercise are not working.  If you smoke, find out from your health care provider how to quit. If you do not use tobacco, do not start.  Lung cancer screening is recommended for adults aged 47-80 years who are at high risk for developing lung cancer because of a history of smoking. A yearly low-dose CT scan of the lungs is recommended for people who have at least a 30-pack-year history of smoking and are current smokers or have quit within the past 15 years. A pack year of smoking is smoking an average of 1 pack of cigarettes a day for 1 year (for example, a 30-pack-year history of smoking could mean smoking 1 pack a day for 30 years or 2 packs a day for 15 years). Yearly screening should continue until the smoker has stopped smoking for at least 15 years. Yearly screening should be stopped for people who develop a health problem that would prevent them from having lung cancer treatment.  If you choose to drink alcohol, do not have more than 2 drinks per day.  One drink is considered to be 12 oz (360 mL) of beer, 5 oz (150 mL) of wine, or 1.5 oz (45 mL) of liquor.  Avoid the use of street drugs. Do not share needles with anyone. Ask for help if you need support or instructions about stopping the use of drugs.  High blood pressure causes heart disease and increases the risk of stroke. High blood pressure is more likely to develop in:  People who have blood pressure in the end of the normal range (100-139/85-89 mm Hg).  People who are  overweight or obese.  People who are African American.  If you are 11-34 years of age, have your blood pressure checked every 3-5 years. If you are 32 years of age or older, have your blood pressure checked every year. You should have your blood pressure measured twice--once when you are at a hospital or clinic, and once when you are not at a hospital or clinic. Record the average of the two measurements. To check your blood pressure when you are not at a hospital or clinic, you can use:  An automated blood pressure machine at a pharmacy.  A home blood pressure monitor.  If you are 68-84 years old, ask your health care provider if you should take aspirin to prevent heart disease.  Diabetes screening involves taking a blood sample to check your fasting blood sugar level. This should be done once every 3 years after age 71 if you are at a normal weight and without risk factors for diabetes. Testing should be considered at a younger age or be carried out more frequently if you are overweight and have at least 1 risk factor for diabetes.  Colorectal cancer can be detected and often prevented. Most routine colorectal cancer screening begins at the age of 69 and continues through age 23. However, your health care provider may recommend screening at an earlier age if you have risk factors for colon cancer. On a yearly basis, your health care provider may provide home test kits to check for hidden blood in the stool. A small camera at the end of a tube may be used to directly examine the colon (sigmoidoscopy or colonoscopy) to detect the earliest forms of colorectal cancer. Talk to your health care provider about this at age 53 when routine screening begins. A direct exam of the colon should be repeated every 5-10 years through age 67, unless early forms of precancerous polyps or small growths are found.  People who are at an increased risk for hepatitis B should be screened for this virus. You are  considered at high risk for hepatitis B if:  You were born in a country where hepatitis B occurs often. Talk with your health care provider about which countries are considered high risk.  Your parents were born in a high-risk country and you have not received a shot to protect against hepatitis B (hepatitis B vaccine).  You have HIV or AIDS.  You use needles to inject street drugs.  You live with, or have sex with, someone who has hepatitis B.  You are a man who has sex with other men (MSM).  You get hemodialysis treatment.  You take certain medicines for conditions like cancer, organ transplantation, and autoimmune conditions.  Hepatitis C blood testing is recommended for all people born from [birth year] through 1963 and any individual with known risk factors for hepatitis C.  Healthy men should no longer receive prostate-specific antigen (PSA) blood tests as part of  routine cancer screening. Talk to your health care provider about prostate cancer screening.  Testicular cancer screening is not recommended for adolescents or adult males who have no symptoms. Screening includes self-exam, a health care provider exam, and other screening tests. Consult with your health care provider about any symptoms you have or any concerns you have about testicular cancer.  Practice safe sex. Use condoms and avoid high-risk sexual practices to reduce the spread of sexually transmitted infections (STIs).  You should be screened for STIs, including gonorrhea and chlamydia if:  You are sexually active and are younger than 24 years.  You are older than 24 years, and your health care provider tells you that you are at risk for this type of infection.  Your sexual activity has changed since you were last screened, and you are at an increased risk for chlamydia or gonorrhea. Ask your health care provider if you are at risk.  If you are at risk of being infected with HIV, it is recommended that you take a  prescription medicine daily to prevent HIV infection. This is called pre-exposure prophylaxis (PrEP). You are considered at risk if:  You are a man who has sex with other men (MSM).  You are a heterosexual man who is sexually active with multiple partners.  You take drugs by injection.  You are sexually active with a partner who has HIV.  Talk with your health care provider about whether you are at high risk of being infected with HIV. If you choose to begin PrEP, you should first be tested for HIV. You should then be tested every 3 months for as long as you are taking PrEP.  Use sunscreen. Apply sunscreen liberally and repeatedly throughout the day. You should seek shade when your shadow is shorter than you. Protect yourself by wearing long sleeves, pants, a wide-brimmed hat, and sunglasses year round whenever you are outdoors.  Tell your health care provider of new moles or changes in moles, especially if there is a change in shape or color. Also, tell your health care provider if a mole is larger than the size of a pencil eraser.  A one-time screening for abdominal aortic aneurysm (AAA) and surgical repair of large AAAs by ultrasound is recommended for men aged 9-75 years who are current or former smokers.  Stay current with your vaccines (immunizations).   This information is not intended to replace advice given to you by your health care provider. Make sure you discuss any questions you have with your health care provider.   Document Released: 06/09/2008 Document Revised: 01/02/2015 Document Reviewed: 05/09/2011 Elsevier Interactive Patient Education [employer].

## 2016-08-01 NOTE — Progress Notes (Signed)
Diabetes Self-Management Education  Visit Type: First/Initial  Appt. Start Time: 1530 Appt. End Time: [id number]  08/01/2016  [name], identified by name and date of birth, is a 45 y.o. male with a diagnosis of Diabetes: Type 2.   ASSESSMENT  Blood pressure 112/72, height 6' (1.829 m), weight 221 lb 8 oz (100.5 kg). Body mass index is 30.04 kg/m.      Diabetes Self-Management Education - 08/01/16 2346      Visit Information   Visit Type First/Initial     Initial Visit   Diabetes Type Type 2     Health Coping   How would you rate your overall health? Good     Psychosocial Assessment   Patient Belief/Attitude about Diabetes Motivated to manage diabetes   Self-care barriers None   Other persons present Spouse/SO   Patient Concerns Glycemic Control;Weight Control;Medication;Healthy Lifestyle;Problem Solving;Nutrition/Meal planning   Special Needs None   Preferred Learning Style Auditory;Visual;Hands on   Learning Readiness Ready   What is the last grade level you completed in school? 12     Pre-Education Assessment   Patient understands the diabetes disease and treatment process. Needs Review   Patient understands incorporating nutritional management into lifestyle. Needs Review   Patient undertands incorporating physical activity into lifestyle. Needs Review   Patient understands using medications safely. Needs Review   Patient understands monitoring blood glucose, interpreting and using results Needs Review   Patient understands prevention, detection, and treatment of acute complications. Needs Review   Patient understands prevention, detection, and treatment of chronic complications. Needs Instruction   Patient understands how to develop strategies to address psychosocial issues. Needs Instruction   Patient understands how to develop strategies to promote health/change behavior. Needs Review     Complications   Last HgB A1C per patient/outside source 14.9 %  06-2016    How often do you check your blood sugar? --  4x/day ac and bedtime   Fasting Blood glucose range (mg/dL) 70-129   Have you had a dilated eye exam in the past 12 months? Yes  02-2016   Have you had a dental exam in the past 12 months? No  03-2015   Are you checking your feet? No     Dietary Intake   Breakfast --  eats 3 meals/day-breakfast at 7:30-8a-has decreased intake of sweets and fried foods and eats more salads and veggies   Snack (morning) --  none   Lunch --  lunch at 11-12p   Snack (afternoon) --  none   Dinner --  supper at 5-5:30p   Snack (evening) --  eats rice cake, carrots with hummus or sugar free pudding   Beverage(s) --  drinks water 4-5x/day     Exercise   Exercise Type ADL's  walk 30-45 min. 4-5x/wk.     Patient Education   Previous Diabetes Education No   Disease state  Definition of diabetes, type 1 and 2, and the diagnosis of diabetes;Explored patient's options for treatment of their diabetes;Factors that contribute to the development of diabetes   Nutrition management  Role of diet in the treatment of diabetes and the relationship between the three main macronutrients and blood glucose level;Food label reading, portion sizes and measuring food.;Carbohydrate counting   Physical activity and exercise  Role of exercise on diabetes management, blood pressure control and cardiac health.;Helped patient identify appropriate exercises in relation to his/her diabetes, diabetes complications and other health issue.   Medications Reviewed patients medication for diabetes, action, purpose,  timing of dose and side effects.;Taught/reviewed insulin injection, site rotation, insulin storage and needle disposal.   Monitoring Purpose and frequency of SMBG.;Taught/discussed recording of test results and interpretation of SMBG.;Identified appropriate SMBG and/or A1C goals.;Yearly dilated eye exam   Acute complications Discussed and identified patients' treatment of  hyperglycemia.;Taught treatment of hypoglycemia - the 15 rule.   Chronic complications Relationship between chronic complications and blood glucose control;Dental care;Retinopathy and reason for yearly dilated eye exams   Psychosocial adjustment Role of stress on diabetes   Personal strategies to promote health Lifestyle issues that need to be addressed for better diabetes care;Helped patient develop diabetes management plan for (enter comment)     Outcomes   Expected Outcomes Demonstrated interest in learning. Expect positive outcomes      Individualized Plan for Diabetes Self-Management Training:   Learning Objective:  Patient will have a greater understanding of diabetes self-management. Patient education plan is to attend individual and/or group sessions per assessed needs and concerns.   Plan:   Patient Instructions   Check blood sugars 4 x day before each meal and before bed every day Exercise:  Continue walking 30-45 min. 5x/wk  Avoid sugar sweetened drinks (soda, tea, coffee, sports drinks, juices) Eat 3 meals day,   1-2  snacks a day-at bedtime and in afternoon if needed Space meals 4-6 hours apart Make a dentist appointment Bring blood sugar records to the next appointment/class Get a sharps container Carry fast acting glucose and a snack at all times Rotate injection sites Return for appointment/classes on: 08-22-16   Expected Outcomes:  Demonstrated interest in learning. Expect positive outcomes  Education material provided:  General meal planning guidelines, low BG handout  If problems or questions, patient to contact team via:  [phone]  Future DSME appointment:  08-22-16

## 2016-08-01 NOTE — Patient Instructions (Signed)
  Check blood sugars 4 x day before each meal and before bed every day Exercise:  Continue walking 30-45 min. 5x/wk  Avoid sugar sweetened drinks (soda, tea, coffee, sports drinks, juices) Eat 3 meals day,   1-2  snacks a day-at bedtime and in afternoon if needed Space meals 4-6 hours apart Make a dentist appointment Bring blood sugar records to the next appointment/class Get a sharps container Carry fast acting glucose and a snack at all times Rotate injection sites Return for appointment/classes on: 08-22-16

## 2016-08-02 DIAGNOSIS — Z0000 Encounter for general adult medical examination without abnormal findings: Secondary | ICD-10-CM | POA: Insufficient documentation

## 2016-08-02 NOTE — Assessment & Plan Note (Signed)
May be type 2 per endo; defer treatment and medicines to her; so appreciative of her seeing patient and getting sugars under control; we reviewed his glucometer readings today and I am thrilled, as I am sure she will be; he is hopeful he can decrease insulin soon; I explained that his lipid panel will likely be off since his sugars have been so out of control lately; I am happy to check his lipids in the next 3 months when he gets his A1c checked again, so endo may draw this, either way; he will likely benefit from a statin; so glad he is working so hard on his diet; he sees diabetic educator today; patient declined offer for foot exam today

## 2016-08-02 NOTE — Assessment & Plan Note (Signed)
USPSTF grade A and B recommendations reviewed with patient; age-appropriate recommendations, preventive care, screening tests, etc discussed and encouraged; healthy living encouraged; see AVS for patient education given to patient  

## 2016-08-02 NOTE — Assessment & Plan Note (Signed)
I explained that endo may choose to decrease his beta-blocker and put him on a low dose ACE-I or ARB, but I will respectfully defer that to her

## 2016-08-08 ENCOUNTER — Telehealth: Payer: Self-pay

## 2016-08-08 DIAGNOSIS — R079 Chest pain, unspecified: Secondary | ICD-10-CM | POA: Insufficient documentation

## 2016-08-08 NOTE — Telephone Encounter (Signed)
Pt called states went out of town to visit parents woke up in the middle of the night with severe chest pain that only lasted a few seconds the next day was sore in chest and then had another 2 episodes.  He did go the to ER, EKG was normal but d dimer was elevated.  They did chest CT which also came back normal. So no know cause for symptoms.  He was given pains and told to notify PCP.

## 2016-08-08 NOTE — Telephone Encounter (Signed)
I am so sorry to hear about this. I am away from the office all week. I don't see any labs or imaging in the system. Please offer referral to cardiologist this week, since diabetes is a significant risk factor for heart disease. If symptoms recur, call 911. Thank you

## 2016-08-09 ENCOUNTER — Encounter: Payer: Self-pay | Admitting: Physician Assistant

## 2016-08-09 DIAGNOSIS — E1165 Type 2 diabetes mellitus with hyperglycemia: Secondary | ICD-10-CM | POA: Insufficient documentation

## 2016-08-09 NOTE — Telephone Encounter (Signed)
Left pt voicemail

## 2016-08-11 ENCOUNTER — Encounter: Payer: Self-pay | Admitting: Physician Assistant

## 2016-08-11 ENCOUNTER — Telehealth: Payer: Self-pay | Admitting: Family Medicine

## 2016-08-11 ENCOUNTER — Ambulatory Visit (INDEPENDENT_AMBULATORY_CARE_PROVIDER_SITE_OTHER): Payer: Managed Care, Other (non HMO) | Admitting: Physician Assistant

## 2016-08-11 ENCOUNTER — Other Ambulatory Visit
Admission: RE | Admit: 2016-08-11 | Discharge: 2016-08-11 | Disposition: A | Discharge status: Home / Self Care | Payer: Managed Care, Other (non HMO) | Source: Ambulatory Visit | Attending: Physician Assistant | Admitting: Physician Assistant

## 2016-08-11 VITALS — BP 116/84 | HR 94 | Ht 73.0 in | Wt 220.5 lb

## 2016-08-11 DIAGNOSIS — K219 Gastro-esophageal reflux disease without esophagitis: Secondary | ICD-10-CM

## 2016-08-11 DIAGNOSIS — R079 Chest pain, unspecified: Secondary | ICD-10-CM

## 2016-08-11 DIAGNOSIS — E118 Type 2 diabetes mellitus with unspecified complications: Secondary | ICD-10-CM | POA: Diagnosis not present

## 2016-08-11 DIAGNOSIS — I10 Essential (primary) hypertension: Secondary | ICD-10-CM | POA: Diagnosis not present

## 2016-08-11 DIAGNOSIS — Z01812 Encounter for preprocedural laboratory examination: Secondary | ICD-10-CM

## 2016-08-11 DIAGNOSIS — B029 Zoster without complications: Secondary | ICD-10-CM

## 2016-08-11 DIAGNOSIS — I200 Unstable angina: Secondary | ICD-10-CM

## 2016-08-11 LAB — PROTIME-INR
INR: 0.96
Prothrombin Time: 12.8 seconds (ref 11.4–15.2)

## 2016-08-11 LAB — BASIC METABOLIC PANEL
ANION GAP: 9 (ref 5–15)
BUN: 17 mg/dL (ref 6–20)
CHLORIDE: 104 mmol/L (ref 101–111)
CO2: 24 mmol/L (ref 22–32)
Calcium: 9.7 mg/dL (ref 8.9–10.3)
Creatinine, Ser: 0.81 mg/dL (ref 0.61–1.24)
GFR calc Af Amer: >60 mL/min (ref >60)
Glucose, Bld: 115 mg/dL — ABNORMAL HIGH (ref 65–99)
POTASSIUM: 3.9 mmol/L (ref 3.5–5.1)
SODIUM: 137 mmol/L (ref 135–145)

## 2016-08-11 LAB — CBC
HEMATOCRIT: 47.5 % (ref 40.0–52.0)
HEMOGLOBIN: 16.3 g/dL (ref 13.0–18.0)
MCH: 29 pg (ref 26.0–34.0)
MCHC: 34.3 g/dL (ref 32.0–36.0)
MCV: 84.5 fL (ref 80.0–100.0)
Platelets: 195 10*3/uL (ref 150–440)
RBC: 5.62 MIL/uL (ref 4.40–5.90)
RDW: 13 % (ref 11.5–14.5)
WBC: 7.5 10*3/uL (ref 3.8–10.6)

## 2016-08-11 MED ORDER — VALACYCLOVIR HCL 1 G PO TABS: 1000.0000 mg | ORAL_TABLET | Freq: Three times a day (TID) | ORAL | 0 refills | Status: AC

## 2016-08-11 NOTE — Patient Instructions (Addendum)
Medication Instructions:  Your physician recommends that you continue on your current medications as directed. Please refer to the Current Medication list given to you today.   Labwork: BMET, CBC, PT/INR, varicella   Testing/Procedures: Your physician has requested that you have a cardiac catheterization. Cardiac catheterization is used to diagnose and/or treat various heart conditions. Doctors may recommend this procedure for a number of different reasons. The most common reason is to evaluate chest pain. Chest pain can be a symptom of coronary artery disease (CAD), and cardiac catheterization can show whether plaque is narrowing or blocking your heart's arteries. This procedure is also used to evaluate the valves, as well as measure the blood flow and oxygen levels in different parts of your heart. For further information please visit [email or web address]. Please follow instruction sheet, as given.  [facility] Cardiac Cath Instructions   You are scheduled for a Cardiac Cath on: Monday, August 21  Please arrive at 8:30am on the day of your procedure  Please expect a call from our [place] to pre-register you  Do not eat/drink anything after midnight  Someone will need to drive you home  It is recommended someone be with you for the first 24 hours after your procedure  Wear clothes that are easy to get on/off and wear slip on shoes if possible   Medications bring a current list of all medications with you   __xx_ Do not take these medications the morning of  your procedure: Lisinopril _XX_ DO NOT take metformin 24 hours before and for 48 hours after your procedure. DO NOT take morning insulin. Take 1/2 dose of evening insulin the night before.    Day of your procedure: Arrive at the [facility] entrance.  Free valet service is available.  After entering the [place] [facility] please check-in at the registration desk (1st desk on your right) to receive your armband. After  receiving your armband someone will escort you to the cardiac cath/special procedures waiting area.  The usual length of stay after your procedure is about 2 to 3 hours.  This can vary.  If you have any questions, please call our office at [phone], or you may call the cardiac cath lab at [facility] directly at [phone]   Follow-Up: Your physician recommends that you schedule a follow-up appointment after 8/21 catheterization.    Any Other Special Instructions Will Be Listed Below (If Applicable). Please follow up with your PCP today for valtrex    If you need a refill on your cardiac medications before your next appointment, please call your pharmacy.   Angiogram An angiogram, also called angiography, is a procedure used to look at the blood vessels. In this procedure, dye is injected through a long, thin tube (catheter) into an artery. X-rays are then taken. The X-rays will show if there is a blockage or problem in a blood vessel.  LET [facility] CARE PROVIDER KNOW ABOUT:  Any allergies you have, including allergies to shellfish or contrast dye.   All medicines you are taking, including vitamins, herbs, eye drops, creams, and over-the-counter medicines.   Previous problems you or members of your family have had with the use of anesthetics.   Any blood disorders you have.   Previous surgeries you have had.  Any previous kidney problems or failure you have had.  Medical conditions you have.   Possibility of pregnancy, if this applies. RISKS AND COMPLICATIONS Generally, an angiogram is a safe procedure. However, as with any procedure, problems  can occur. Possible problems include:  Injury to the blood vessels, including rupture or bleeding.  Infection or bruising at the catheter site.  Allergic reaction to the dye or contrast used.  Kidney damage from the dye or contrast used.  Blood clots that can lead to a stroke or heart attack. BEFORE THE PROCEDURE  Do  not eat or drink after midnight on the night before the procedure, or as directed by your health care provider.   Ask your health care provider if you may drink enough water to take any needed medicines the morning of the procedure.  PROCEDURE  You may be given a medicine to help you relax (sedative) before and during the procedure. This medicine is given through an IV access tube that is inserted into one of your veins.   The area where the catheter will be inserted will be washed and shaved. This is usually done in the groin but may be done in the fold of your arm (near your elbow) or in the wrist.  A medicine will be given to numb the area where the catheter will be inserted (local anesthetic).  The catheter will be inserted with a guide wire into an artery. The catheter is guided by using a type of X-ray (fluoroscopy) to the blood vessel being examined.   Dye is then injected into the catheter, and X-rays are taken. The dye helps to show where any narrowing or blockages are located.  AFTER THE PROCEDURE   If the procedure is done through the leg, you will be kept in bed lying flat for several hours. You will be instructed to not bend or cross your legs.  The insertion site will be checked frequently.  The pulse in your feet or wrist will be checked frequently.  Additional blood tests, X-rays, and electrocardiography may be done.   You may need to stay in the hospital overnight for observation.    This information is not intended to replace advice given to you by your health care provider. Make sure you discuss any questions you have with your health care provider.   Document Released: 09/21/2005 Document Revised: 01/02/2015 Document Reviewed: 05/15/2013 Elsevier Interactive Patient Education 2016 [place] After Refer to this sheet in the next few weeks. These instructions provide you with information about caring for yourself after your procedure. Your  health care provider may also give you more specific instructions. Your treatment has been planned according to current medical practices, but problems sometimes occur. Call your health care provider if you have any problems or questions after your procedure. WHAT TO EXPECT AFTER THE PROCEDURE After your procedure, it is typical to have the following:  Bruising at the catheter insertion site that usually fades within 1-2 weeks.  Blood collecting in the tissue (hematoma) that may be painful to the touch. It should usually decrease in size and tenderness within 1-2 weeks. HOME CARE INSTRUCTIONS  Take medicines only as directed by your health care provider.  You may shower 24-48 hours after the procedure or as directed by your health care provider. Remove the bandage (dressing) and gently wash the site with plain soap and water. Pat the area dry with a clean towel. Do not rub the site, because this may cause bleeding.  Do not take baths, swim, or use a hot tub until your health care provider approves.  Check your insertion site every day for redness, swelling, or drainage.  Do not apply powder or lotion to the  site.  Do not lift over 10 lb (4.5 kg) for 5 days after your procedure or as directed by your health care provider.  Ask your health care provider when it is okay to:  Return to work or school.  Resume usual physical activities or sports.  Resume sexual activity.  Do not drive home if you are discharged the same day as the procedure. Have someone else drive you.  You may drive 24 hours after the procedure unless otherwise instructed by your health care provider.  Do not operate machinery or power tools for 24 hours after the procedure or as directed by your health care provider.  If your procedure was done as an outpatient procedure, which means that you went home the same day as your procedure, a responsible adult should be with you for the first 24 hours after you arrive  home.  Keep all follow-up visits as directed by your health care provider. This is important. SEEK MEDICAL CARE IF:  You have a fever.  You have chills.  You have increased bleeding from the catheter insertion site. Hold pressure on the site. SEEK IMMEDIATE MEDICAL CARE IF:  You have unusual pain at the catheter insertion site.  You have redness, warmth, or swelling at the catheter insertion site.  You have drainage (other than a small amount of blood on the dressing) from the catheter insertion site.  The catheter insertion site is bleeding, and the bleeding does not stop after 30 minutes of holding steady pressure on the site.  The area near or just beyond the catheter insertion site becomes pale, cool, tingly, or numb.   This information is not intended to replace advice given to you by your health care provider. Make sure you discuss any questions you have with your health care provider.   Document Released: 06/30/2005 Document Revised: 01/02/2015 Document Reviewed: 05/15/2013 Elsevier Interactive Patient Education [employer].

## 2016-08-11 NOTE — Telephone Encounter (Signed)
I spoke with patient after reading cardiology note; start valacyclovir; cath is Monday; he is not on statin, but cardiologist started aspirin; I'll leave cholesterol to cardiologist, decision for statin to cardiology; I added aspirin to med list

## 2016-08-11 NOTE — Progress Notes (Signed)
Cardiology Office Note Date:  08/11/2016  Patient ID:  John Hartman [birth year], MRN [id number] PCP:  Enid Derry, MD  Cardiologist:  Dr. Acie Fredrickson, MD    Chief Complaint: Chest pain  History of Present Illness: John Hartman is a 45 y.o. male with history of DM2 diagnosed in 05/2016 followed by Dr. Gabriel Carina, MD, HTN, IBS, migraines, GERD, and history of angioedema who presents for evaluation of chest pain.   Patient has previously been seen by Dr. Acie Fredrickson on 06/25/2015 for evaluation of episodes of bradycardia and tachycardia with heart rate ranging from the 40's to 110's on his phone lasting minutes to hours before self resolving. He underwent 30-day event monitor to evaluate for PAF that showed SNR with sinus bradycardia with no significant arrhythmias.   He was out of town visiting his parents in [place] when he was woken up with substernal chest pain that lasted several minutes before self resolving. His chest continued to be sore later that morning with 2 more episodes of increased chest pain with exertion. He went to the ER in [place] with records showing a negative troponin x 1, elevated D-dimer at 2.75, K+ 4.3, SCr 1.04, wbc 6.1, hgb 16.4, reportedly a non-acute EKG (no record). He underwent CTA chest that was negative for PE. CTA chest showed thoracic aorta atherosclerosis and coronary artery calcifications. He reports palpation of his ches tdid not reproduce his pain. He was told to follow up as an outpatient.  Since he was seen in the ED in [place] the prior weekend he has continued to have left-sided chest pain, though not as severe as the initial episode that woke him up over the weekend. He notes increased fatigue with ambulation. No cough. No nausea, vomiting, diaphoresis, dizziness, presyncope, or syncope. Lastly, this morning upon waking up he noted a mildly painful rash along his left lower chest wall that extended to the mid axillary line. He did have chicken pox as a child. He is  currently without chest pain.    Past Medical History:  Diagnosis Date  . Angioedema   . DDD (degenerative disc disease), cervical   . Diabetes mellitus ([place])   . GERD (gastroesophageal reflux disease)   . Gout   . HTN (hypertension)   . Hypertension    CONTROLLED ON MEDS  . IBS (irritable bowel syndrome)   . Seasonal allergies   . Spinal stenosis     Past Surgical History:  Procedure Laterality Date  . ACDF with fusion  2007  . COLONOSCOPY WITH PROPOFOL N/A 06/26/2015   Procedure: COLONOSCOPY WITH PROPOFOL;  Surgeon: Lucilla Lame, MD;  Location: [facility];  Service: Endoscopy;  Laterality: N/A;  with biopsies  . NASAL SEPTUM SURGERY  2004  . SHOULDER SURGERY Left 2007  . Testicular torsion  1980s  . VASECTOMY      Current Outpatient Prescriptions  Medication Sig Dispense Refill  . ACCU-CHEK SOFTCLIX LANCETS lancets USE AS DIRECTED 100 each 0  . allopurinol (ZYLOPRIM) 100 MG tablet Take 100 mg by mouth daily.    . belladonna-PHENObarbital (DONNATAL) 16.2 MG/5ML ELIX Take 5 mLs (16.2 mg total) by mouth 4 (four) times daily as needed for cramping or pain. (Patient taking differently: Take 5 mLs by mouth 4 (four) times daily as needed for cramping or pain. ) 600 mL 1  . cetirizine (ZYRTEC) 10 MG tablet Take 10 mg by mouth daily.    . colchicine 0.6 MG tablet Take 0.6 mg by mouth daily as needed.    Marland Kitchen  cyclobenzaprine (FLEXERIL) 10 MG tablet Take 1 tablet (10 mg total) by mouth every 8 (eight) hours as needed for muscle spasms. 30 tablet 2  . diphenhydrAMINE (BENADRYL) 25 mg capsule Take 25 mg by mouth daily.     Marland Kitchen EPINEPHrine (EPIPEN 2-PAK) 0.3 mg/0.3 mL IJ SOAJ injection Inject 0.3 mg into the muscle once.    Marland Kitchen glucose blood (ACCU-CHEK ACTIVE STRIPS) test strip Use as instructed 100 each 12  . glucose blood (ACCU-CHEK AVIVA PLUS) test strip Check FSBS three to four times per day 100 each 1  . ibuprofen (ADVIL,MOTRIN) 800 MG tablet Take 1 tablet (800 mg total) by mouth 3  (three) times daily. 21 tablet 0  . Insulin Degludec (TRESIBA FLEXTOUCH) 100 UNIT/ML SOPN Inject 20 Units into the skin at bedtime.  1 pen 0  . Insulin Pen Needle (B-D UF III MINI PEN NEEDLES) 31G X 5 MM MISC Once a day with insulin 50 each 3  . Melatonin 5 MG TABS Take 5 mg by mouth at bedtime.     . ranitidine (ZANTAC) 150 MG capsule Take 150 mg by mouth daily.    . metFORMIN (GLUCOPHAGE-XR) 500 MG 24 hr tablet Take 1,500 mg by mouth daily.      No current facility-administered medications for this visit.     Allergies:   Review of patient's allergies indicates no known allergies.   Social History:  The patient  reports that he quit smoking about 15 months ago. His smoking use included Cigarettes. He has a 10.00 pack-year smoking history. He has never used smokeless tobacco. He reports that he does not drink alcohol or use drugs.   Family History:  The patient's family history includes Arrhythmia in his mother; Breast cancer in his mother; Cancer in his maternal grandfather and mother; Hypertension in his father; Skin cancer in his mother.  ROS:   Review of Systems  Constitutional: Positive for malaise/fatigue. Negative for chills, diaphoresis, fever and weight loss.  HENT: Negative for congestion.   Eyes: Negative for discharge and redness.  Respiratory: Negative for cough, sputum production, shortness of breath and wheezing.   Cardiovascular: Positive for chest pain. Negative for palpitations, orthopnea, claudication, leg swelling and PND.  Gastrointestinal: Negative for abdominal pain, heartburn, nausea and vomiting.  Musculoskeletal: Negative for falls and myalgias.  Skin: Negative for rash.  Neurological: Positive for weakness and headaches. Negative for dizziness, tingling, tremors, sensory change, speech change, focal weakness and loss of consciousness.  Endo/Heme/Allergies: Does not bruise/bleed easily.  Psychiatric/Behavioral: Negative for substance abuse. The patient is not  nervous/anxious.   All other systems reviewed and are negative.    PHYSICAL EXAM:  VS:  BP 116/84 (BP Location: Left Arm, Patient Position: Sitting, Cuff Size: Normal)   Pulse 94   Ht 6\' 1"  (1.854 m)   Wt 220 lb 8 oz (100 kg)   BMI 29.09 kg/m  BMI: Body mass index is 29.09 kg/m.  Physical Exam  Constitutional: He is oriented to person, place, and time. He appears well-developed and well-nourished.  HENT:  Head: Normocephalic and atraumatic.  Eyes: Right eye exhibits no discharge. Left eye exhibits no discharge.  Neck: Normal range of motion. No JVD present.  Cardiovascular: Normal rate, regular rhythm, S1 normal, S2 normal and normal heart sounds.  Exam reveals no distant heart sounds, no friction rub, no midsystolic click and no opening snap.   No murmur heard. Pulmonary/Chest: Effort normal and breath sounds normal. No respiratory distress. He has no decreased breath  sounds. He has no wheezes. He has no rales. He exhibits no tenderness.  Abdominal: Soft. He exhibits no distension. There is no tenderness.  Musculoskeletal: He exhibits no edema.  Neurological: He is alert and oriented to person, place, and time.  Skin: Skin is warm and dry. Rash noted. Rash is vesicular. No cyanosis. Nails show no clubbing.     Psychiatric: He has a normal mood and affect. His speech is normal and behavior is normal. Judgment and thought content normal.     EKG:  Was ordered and interpreted by me today. Shows NSR, 94 bpm, nonspecific inferior st/t changes.   Recent Labs: 07/08/2016: ALT 57; Hemoglobin 17.7; Platelets 187 07/11/2016: BUN 15; Creatinine, Ser 0.91; Potassium 4.0; Sodium 133  No results found for requested labs within last 8760 hours.   CrCl cannot be calculated (Patient's most recent lab result is older than the maximum 21 days allowed.).   Wt Readings from Last 3 Encounters:  08/11/16 220 lb 8 oz (100 kg)  08/01/16 221 lb 8 oz (100.5 kg)  08/01/16 221 lb (100.2 kg)      Other studies reviewed: Additional studies/records reviewed today include: summarized above  ASSESSMENT AND PLAN:  1. Unstable angina: Pain has continued into this week. No pain currently. Given noted thoracic aorta athersclerosis and coronary artery atherosclerosis patient will likely benefit from cardiac cath at this time vs nuclear stress testing. Patient prefers to have a cardiac catheterization for definitive evaluation. Risks and benefits of cardiac catheterization have been discussed with the patient including risks of bleeding, bruising, infection, kidney damage, stroke, heart attack, and death. The patient understands these risks and is willing to proceed with the procedure. All questions have been answered and concerns listened to. He will need to hold metformin and lisinopril pre-cath and metformin 48 hours post cath. Recommend starting ASA.    2. HTN: Well controlled. Continue lisinopril.  3. DM2: Followed by Dr. Gabriel Carina, MD.   4. GERD: Zantac. Stable.   5. Rash: Possibly c/w shingles, though there is one lesion that crosses mid-line. Check varicella IgG/IgM titers (unable to culture in the office). Recommend patient follow up with PCP within the next 24 hours for possible anti-viral therapy. It certainly is possible this was leading to his chest pain symptoms, though I cannot rule out ischemia given the description of pain, ongoing pain, and noted coronary calcifications seen on CTA chest.   Disposition: F/u with me or Dr. Fletcher Anon, MD s/p cardiac catheterization    Current medicines are reviewed at length with the patient today.  The patient did not have any concerns regarding medicines.  Melvern Banker PA-C 08/11/2016 2:38 PM     [address] [place], [place] [id number] [phone]

## 2016-08-12 LAB — VARICELLA ZOSTER ANTIBODY, IGM

## 2016-08-12 LAB — VARICELLA ZOSTER ANTIBODY, IGG: Varicella IgG: 563 index (ref >165)

## 2016-08-15 ENCOUNTER — Encounter
Admission: RE | Disposition: A | Discharge status: Home / Self Care | Payer: Self-pay | Source: Ambulatory Visit | Attending: Cardiovascular Disease

## 2016-08-15 ENCOUNTER — Encounter: Payer: Self-pay | Admitting: *Deleted

## 2016-08-15 ENCOUNTER — Ambulatory Visit
Admission: RE | Admit: 2016-08-15 | Discharge: 2016-08-15 | Disposition: A | Discharge status: Home / Self Care | Payer: Managed Care, Other (non HMO) | Source: Ambulatory Visit | Attending: Cardiovascular Disease | Admitting: Cardiovascular Disease

## 2016-08-15 ENCOUNTER — Encounter: Payer: Self-pay | Admitting: Certified Registered Nurse Anesthetist

## 2016-08-15 DIAGNOSIS — Z87891 Personal history of nicotine dependence: Secondary | ICD-10-CM | POA: Insufficient documentation

## 2016-08-15 DIAGNOSIS — Z8249 Family history of ischemic heart disease and other diseases of the circulatory system: Secondary | ICD-10-CM | POA: Diagnosis not present

## 2016-08-15 DIAGNOSIS — Z808 Family history of malignant neoplasm of other organs or systems: Secondary | ICD-10-CM | POA: Insufficient documentation

## 2016-08-15 DIAGNOSIS — Z79899 Other long term (current) drug therapy: Secondary | ICD-10-CM | POA: Diagnosis not present

## 2016-08-15 DIAGNOSIS — Z794 Long term (current) use of insulin: Secondary | ICD-10-CM | POA: Diagnosis not present

## 2016-08-15 DIAGNOSIS — M109 Gout, unspecified: Secondary | ICD-10-CM | POA: Insufficient documentation

## 2016-08-15 DIAGNOSIS — Z803 Family history of malignant neoplasm of breast: Secondary | ICD-10-CM | POA: Diagnosis not present

## 2016-08-15 DIAGNOSIS — K219 Gastro-esophageal reflux disease without esophagitis: Secondary | ICD-10-CM | POA: Diagnosis not present

## 2016-08-15 DIAGNOSIS — K589 Irritable bowel syndrome without diarrhea: Secondary | ICD-10-CM | POA: Diagnosis not present

## 2016-08-15 DIAGNOSIS — E119 Type 2 diabetes mellitus without complications: Secondary | ICD-10-CM | POA: Insufficient documentation

## 2016-08-15 DIAGNOSIS — J302 Other seasonal allergic rhinitis: Secondary | ICD-10-CM | POA: Insufficient documentation

## 2016-08-15 DIAGNOSIS — I25110 Atherosclerotic heart disease of native coronary artery with unstable angina pectoris: Secondary | ICD-10-CM | POA: Diagnosis not present

## 2016-08-15 DIAGNOSIS — I10 Essential (primary) hypertension: Secondary | ICD-10-CM | POA: Diagnosis not present

## 2016-08-15 DIAGNOSIS — I200 Unstable angina: Secondary | ICD-10-CM

## 2016-08-15 DIAGNOSIS — M5030 Other cervical disc degeneration, unspecified cervical region: Secondary | ICD-10-CM | POA: Insufficient documentation

## 2016-08-15 HISTORY — PX: CARDIAC CATHETERIZATION: SHX172

## 2016-08-15 LAB — GLUCOSE, CAPILLARY: GLUCOSE-CAPILLARY: 125 mg/dL — AB (ref 65–99)

## 2016-08-15 SURGERY — LEFT HEART CATH AND CORONARY ANGIOGRAPHY
Anesthesia: Moderate Sedation | Laterality: Left

## 2016-08-15 MED ORDER — ASPIRIN 81 MG PO CHEW
CHEWABLE_TABLET | ORAL | Status: AC
  Filled 2016-08-15: qty 1

## 2016-08-15 MED ORDER — VERAPAMIL HCL 2.5 MG/ML IV SOLN
INTRAVENOUS | Status: DC | PRN
  Administered 2016-08-15: 2.5 mg via INTRA_ARTERIAL

## 2016-08-15 MED ORDER — HEPARIN (PORCINE) IN NACL 2-0.9 UNIT/ML-% IJ SOLN
INTRAMUSCULAR | Status: AC
  Filled 2016-08-15: qty 500

## 2016-08-15 MED ORDER — SODIUM CHLORIDE 0.9 % IV SOLN
INTRAVENOUS | Status: DC
  Administered 2016-08-15: 09:00:00 via INTRAVENOUS

## 2016-08-15 MED ORDER — ASPIRIN 81 MG PO CHEW
81.0000 mg | CHEWABLE_TABLET | ORAL | Status: AC
  Administered 2016-08-15: 81 mg via ORAL

## 2016-08-15 MED ORDER — VERAPAMIL HCL 2.5 MG/ML IV SOLN
INTRAVENOUS | Status: AC
  Filled 2016-08-15: qty 2

## 2016-08-15 MED ORDER — FENTANYL CITRATE (PF) 100 MCG/2ML IJ SOLN
INTRAMUSCULAR | Status: DC | PRN
  Administered 2016-08-15: 25 ug via INTRAVENOUS

## 2016-08-15 MED ORDER — IOPAMIDOL (ISOVUE-300) INJECTION 61%
INTRAVENOUS | Status: DC | PRN
  Administered 2016-08-15: 70 mL via INTRA_ARTERIAL

## 2016-08-15 MED ORDER — HEPARIN SODIUM (PORCINE) 1000 UNIT/ML IJ SOLN
INTRAMUSCULAR | Status: DC | PRN
  Administered 2016-08-15: 5000 [IU] via INTRAVENOUS

## 2016-08-15 MED ORDER — MIDAZOLAM HCL 2 MG/2ML IJ SOLN
INTRAMUSCULAR | Status: AC
  Filled 2016-08-15: qty 2

## 2016-08-15 MED ORDER — HEPARIN SODIUM (PORCINE) 1000 UNIT/ML IJ SOLN
INTRAMUSCULAR | Status: AC
  Filled 2016-08-15: qty 1

## 2016-08-15 MED ORDER — FENTANYL CITRATE (PF) 100 MCG/2ML IJ SOLN
INTRAMUSCULAR | Status: AC
  Filled 2016-08-15: qty 2

## 2016-08-15 MED ORDER — MIDAZOLAM HCL 2 MG/2ML IJ SOLN
INTRAMUSCULAR | Status: DC | PRN
  Administered 2016-08-15: 1 mg via INTRAVENOUS

## 2016-08-15 SURGICAL SUPPLY — 8 items
CATH INFINITI 5FR ANG PIGTAIL (CATHETERS) ×2 IMPLANT
CATH OPTITORQUE JACKY 4.0 5F (CATHETERS) ×2 IMPLANT
DEVICE RAD TR BAND REGULAR (VASCULAR PRODUCTS) ×2 IMPLANT
DRAPE BRACHIAL (DRAPES) ×2 IMPLANT
GLIDESHEATH SLEND SS 6F .021 (SHEATH) ×2 IMPLANT
KIT MANI 3VAL PERCEP (MISCELLANEOUS) ×2 IMPLANT
PACK CARDIAC CATH (CUSTOM PROCEDURE TRAY) ×2 IMPLANT
WIRE SAFE-T 1.5MM-J .035X260CM (WIRE) ×2 IMPLANT

## 2016-08-15 NOTE — H&P (View-Only) (Signed)
Cardiology Office Note Date:  08/11/2016  Patient ID:  John Hartman, John Hartman December 13, 1971, MRN [id number] PCP:  Enid Derry, MD  Cardiologist:  Dr. Acie Fredrickson, MD    Chief Complaint: Chest pain  History of Present Illness: John Hartman is a 45 y.o. male with history of DM2 diagnosed in 05/2016 followed by Dr. Gabriel Carina, MD, HTN, IBS, migraines, GERD, and history of angioedema who presents for evaluation of chest pain.   Patient has previously been seen by Dr. Acie Fredrickson on 06/25/2015 for evaluation of episodes of bradycardia and tachycardia with heart rate ranging from the 40's to 110's on his phone lasting minutes to hours before self resolving. He underwent 30-day event monitor to evaluate for PAF that showed SNR with sinus bradycardia with no significant arrhythmias.   He was out of town visiting his parents in [place] when he was woken up with substernal chest pain that lasted several minutes before self resolving. His chest continued to be sore later that morning with 2 more episodes of increased chest pain with exertion. He went to the ER in [place] with records showing a negative troponin x 1, elevated D-dimer at 2.75, K+ 4.3, SCr 1.04, wbc 6.1, hgb 16.4, reportedly a non-acute EKG (no record). He underwent CTA chest that was negative for PE. CTA chest showed thoracic aorta atherosclerosis and coronary artery calcifications. He reports palpation of his ches tdid not reproduce his pain. He was told to follow up as an outpatient.  Since he was seen in the ED in [place] the prior weekend he has continued to have left-sided chest pain, though not as severe as the initial episode that woke him up over the weekend. He notes increased fatigue with ambulation. No cough. No nausea, vomiting, diaphoresis, dizziness, presyncope, or syncope. Lastly, this morning upon waking up he noted a mildly painful rash along his left lower chest wall that extended to the mid axillary line. He did have chicken pox as a child. He is  currently without chest pain.    Past Medical History:  Diagnosis Date  . Angioedema   . DDD (degenerative disc disease), cervical   . Diabetes mellitus ([place])   . GERD (gastroesophageal reflux disease)   . Gout   . HTN (hypertension)   . Hypertension    CONTROLLED ON MEDS  . IBS (irritable bowel syndrome)   . Seasonal allergies   . Spinal stenosis     Past Surgical History:  Procedure Laterality Date  . ACDF with fusion  2007  . COLONOSCOPY WITH PROPOFOL N/A 06/26/2015   Procedure: COLONOSCOPY WITH PROPOFOL;  Surgeon: Lucilla Lame, MD;  Location: [facility];  Service: Endoscopy;  Laterality: N/A;  with biopsies  . NASAL SEPTUM SURGERY  2004  . SHOULDER SURGERY Left 2007  . Testicular torsion  1980s  . VASECTOMY      Current Outpatient Prescriptions  Medication Sig Dispense Refill  . ACCU-CHEK SOFTCLIX LANCETS lancets USE AS DIRECTED 100 each 0  . allopurinol (ZYLOPRIM) 100 MG tablet Take 100 mg by mouth daily.    . belladonna-PHENObarbital (DONNATAL) 16.2 MG/5ML ELIX Take 5 mLs (16.2 mg total) by mouth 4 (four) times daily as needed for cramping or pain. (Patient taking differently: Take 5 mLs by mouth 4 (four) times daily as needed for cramping or pain. ) 600 mL 1  . cetirizine (ZYRTEC) 10 MG tablet Take 10 mg by mouth daily.    . colchicine 0.6 MG tablet Take 0.6 mg by mouth daily as needed.    Marland Kitchen  cyclobenzaprine (FLEXERIL) 10 MG tablet Take 1 tablet (10 mg total) by mouth every 8 (eight) hours as needed for muscle spasms. 30 tablet 2  . diphenhydrAMINE (BENADRYL) 25 mg capsule Take 25 mg by mouth daily.     Marland Kitchen EPINEPHrine (EPIPEN 2-PAK) 0.3 mg/0.3 mL IJ SOAJ injection Inject 0.3 mg into the muscle once.    Marland Kitchen glucose blood (ACCU-CHEK ACTIVE STRIPS) test strip Use as instructed 100 each 12  . glucose blood (ACCU-CHEK AVIVA PLUS) test strip Check FSBS three to four times per day 100 each 1  . ibuprofen (ADVIL,MOTRIN) 800 MG tablet Take 1 tablet (800 mg total) by mouth 3  (three) times daily. 21 tablet 0  . Insulin Degludec (TRESIBA FLEXTOUCH) 100 UNIT/ML SOPN Inject 20 Units into the skin at bedtime.  1 pen 0  . Insulin Pen Needle (B-D UF III MINI PEN NEEDLES) 31G X 5 MM MISC Once a day with insulin 50 each 3  . Melatonin 5 MG TABS Take 5 mg by mouth at bedtime.     . ranitidine (ZANTAC) 150 MG capsule Take 150 mg by mouth daily.    . metFORMIN (GLUCOPHAGE-XR) 500 MG 24 hr tablet Take 1,500 mg by mouth daily.      No current facility-administered medications for this visit.     Allergies:   Review of patient's allergies indicates no known allergies.   Social History:  The patient  reports that he quit smoking about 15 months ago. His smoking use included Cigarettes. He has a 10.00 pack-year smoking history. He has never used smokeless tobacco. He reports that he does not drink alcohol or use drugs.   Family History:  The patient's family history includes Arrhythmia in his mother; Breast cancer in his mother; Cancer in his maternal grandfather and mother; Hypertension in his father; Skin cancer in his mother.  ROS:   Review of Systems  Constitutional: Positive for malaise/fatigue. Negative for chills, diaphoresis, fever and weight loss.  HENT: Negative for congestion.   Eyes: Negative for discharge and redness.  Respiratory: Negative for cough, sputum production, shortness of breath and wheezing.   Cardiovascular: Positive for chest pain. Negative for palpitations, orthopnea, claudication, leg swelling and PND.  Gastrointestinal: Negative for abdominal pain, heartburn, nausea and vomiting.  Musculoskeletal: Negative for falls and myalgias.  Skin: Negative for rash.  Neurological: Positive for weakness and headaches. Negative for dizziness, tingling, tremors, sensory change, speech change, focal weakness and loss of consciousness.  Endo/Heme/Allergies: Does not bruise/bleed easily.  Psychiatric/Behavioral: Negative for substance abuse. The patient is not  nervous/anxious.   All other systems reviewed and are negative.    PHYSICAL EXAM:  VS:  BP 116/84 (BP Location: Left Arm, Patient Position: Sitting, Cuff Size: Normal)   Pulse 94   Ht 6\' 1"  (1.854 m)   Wt 220 lb 8 oz (100 kg)   BMI 29.09 kg/m  BMI: Body mass index is 29.09 kg/m.  Physical Exam  Constitutional: He is oriented to person, place, and time. He appears well-developed and well-nourished.  HENT:  Head: Normocephalic and atraumatic.  Eyes: Right eye exhibits no discharge. Left eye exhibits no discharge.  Neck: Normal range of motion. No JVD present.  Cardiovascular: Normal rate, regular rhythm, S1 normal, S2 normal and normal heart sounds.  Exam reveals no distant heart sounds, no friction rub, no midsystolic click and no opening snap.   No murmur heard. Pulmonary/Chest: Effort normal and breath sounds normal. No respiratory distress. He has no decreased breath  sounds. He has no wheezes. He has no rales. He exhibits no tenderness.  Abdominal: Soft. He exhibits no distension. There is no tenderness.  Musculoskeletal: He exhibits no edema.  Neurological: He is alert and oriented to person, place, and time.  Skin: Skin is warm and dry. Rash noted. Rash is vesicular. No cyanosis. Nails show no clubbing.     Psychiatric: He has a normal mood and affect. His speech is normal and behavior is normal. Judgment and thought content normal.     EKG:  Was ordered and interpreted by me today. Shows NSR, 94 bpm, nonspecific inferior st/t changes.   Recent Labs: 07/08/2016: ALT 57; Hemoglobin 17.7; Platelets 187 07/11/2016: BUN 15; Creatinine, Ser 0.91; Potassium 4.0; Sodium 133  No results found for requested labs within last 8760 hours.   CrCl cannot be calculated (Patient's most recent lab result is older than the maximum 21 days allowed.).   Wt Readings from Last 3 Encounters:  08/11/16 220 lb 8 oz (100 kg)  08/01/16 221 lb 8 oz (100.5 kg)  08/01/16 221 lb (100.2 kg)      Other studies reviewed: Additional studies/records reviewed today include: summarized above  ASSESSMENT AND PLAN:  1. Unstable angina: Pain has continued into this week. No pain currently. Given noted thoracic aorta athersclerosis and coronary artery atherosclerosis patient will likely benefit from cardiac cath at this time vs nuclear stress testing. Patient prefers to have a cardiac catheterization for definitive evaluation. Risks and benefits of cardiac catheterization have been discussed with the patient including risks of bleeding, bruising, infection, kidney damage, stroke, heart attack, and death. The patient understands these risks and is willing to proceed with the procedure. All questions have been answered and concerns listened to. He will need to hold metformin and lisinopril pre-cath and metformin 48 hours post cath. Recommend starting ASA.    2. HTN: Well controlled. Continue lisinopril.  3. DM2: Followed by Dr. Gabriel Carina, MD.   4. GERD: Zantac. Stable.   5. Rash: Possibly c/w shingles, though there is one lesion that crosses mid-line. Check varicella IgG/IgM titers (unable to culture in the office). Recommend patient follow up with PCP within the next 24 hours for possible anti-viral therapy. It certainly is possible this was leading to his chest pain symptoms, though I cannot rule out ischemia given the description of pain, ongoing pain, and noted coronary calcifications seen on CTA chest.   Disposition: F/u with me or Dr. Fletcher Anon, MD s/p cardiac catheterization    Current medicines are reviewed at length with the patient today.  The patient did not have any concerns regarding medicines.  Melvern Banker PA-C 08/11/2016 2:38 PM     [address] [place], [place] [address] [phone]

## 2016-08-15 NOTE — Discharge Instructions (Signed)
Keep right wrist elevated on pillow above the heart for today.  Watch right wrist for evidence of bleeding or hematoma.. If bleeding or hematoma noted, hold pressure over the site for at least 15 minutes and notify the physician.  No blending or flexing of the wrist--no lifting for the remainder of the day or for 2 weeks after your procedure. Notify the physician for evidence of infection or if you get a temperature. No driving for 42 hours post heart cath.

## 2016-08-15 NOTE — Interval H&P Note (Signed)
Cath Lab Visit (complete for each Cath Lab visit)  Clinical Evaluation Leading to the Procedure:   ACS: No.  Non-ACS:    Anginal Classification: CCS IV  Anti-ischemic medical therapy: No Therapy  Non-Invasive Test Results: No non-invasive testing performed  Prior CABG: No previous CABG      History and Physical Interval Note:  08/15/2016 10:28 AM  [name]  has presented today for surgery, with the diagnosis of LT Cath   Unstable angina  The various methods of treatment have been discussed with the patient and family. After consideration of risks, benefits and other options for treatment, the patient has consented to  Procedure(s): Left Heart Cath and Coronary Angiography (Left) as a surgical intervention .  The patient's history has been reviewed, patient examined, no change in status, stable for surgery.  I have reviewed the patient's chart and labs.  Questions were answered to the patient's satisfaction.     John Hartman

## 2016-08-18 ENCOUNTER — Encounter: Payer: Self-pay | Admitting: Physician Assistant

## 2016-08-22 ENCOUNTER — Encounter: Payer: Self-pay | Admitting: Dietician

## 2016-08-22 ENCOUNTER — Encounter: Payer: Managed Care, Other (non HMO) | Admitting: Dietician

## 2016-08-22 VITALS — Ht 72.0 in | Wt 219.3 lb

## 2016-08-22 DIAGNOSIS — E119 Type 2 diabetes mellitus without complications: Secondary | ICD-10-CM

## 2016-08-22 DIAGNOSIS — Z713 Dietary counseling and surveillance: Secondary | ICD-10-CM | POA: Diagnosis not present

## 2016-08-22 NOTE — Progress Notes (Signed)

## 2016-08-25 ENCOUNTER — Encounter: Payer: Self-pay | Admitting: Family Medicine

## 2016-08-25 DIAGNOSIS — Z5181 Encounter for therapeutic drug level monitoring: Secondary | ICD-10-CM | POA: Insufficient documentation

## 2016-08-25 DIAGNOSIS — I2510 Atherosclerotic heart disease of native coronary artery without angina pectoris: Secondary | ICD-10-CM

## 2016-08-25 MED ORDER — ATORVASTATIN CALCIUM 20 MG PO TABS: 20.0000 mg | ORAL_TABLET | Freq: Every day | ORAL | 1 refills | Status: DC

## 2016-08-25 NOTE — Assessment & Plan Note (Signed)
Start statin; check lipids in 6 weeks

## 2016-08-25 NOTE — Telephone Encounter (Signed)
Orders printed for labs, up front

## 2016-08-25 NOTE — Assessment & Plan Note (Signed)
Start statin, check labs in 6 weeks

## 2016-08-25 NOTE — Assessment & Plan Note (Signed)
Check sgpt in 6 weeks 

## 2016-08-27 ENCOUNTER — Encounter: Payer: Self-pay | Admitting: Emergency Medicine

## 2016-08-27 ENCOUNTER — Inpatient Hospital Stay
Admission: EM | Admit: 2016-08-27 | Discharge: 2016-08-28 | DRG: 916 | Disposition: A | Discharge status: Home / Self Care | Payer: Managed Care, Other (non HMO) | Attending: Internal Medicine | Admitting: Internal Medicine

## 2016-08-27 DIAGNOSIS — F419 Anxiety disorder, unspecified: Secondary | ICD-10-CM | POA: Diagnosis present

## 2016-08-27 DIAGNOSIS — T783XXA Angioneurotic edema, initial encounter: Principal | ICD-10-CM | POA: Diagnosis present

## 2016-08-27 DIAGNOSIS — J302 Other seasonal allergic rhinitis: Secondary | ICD-10-CM | POA: Diagnosis present

## 2016-08-27 DIAGNOSIS — E119 Type 2 diabetes mellitus without complications: Secondary | ICD-10-CM | POA: Diagnosis present

## 2016-08-27 DIAGNOSIS — Z808 Family history of malignant neoplasm of other organs or systems: Secondary | ICD-10-CM | POA: Diagnosis not present

## 2016-08-27 DIAGNOSIS — Z9861 Coronary angioplasty status: Secondary | ICD-10-CM

## 2016-08-27 DIAGNOSIS — Z803 Family history of malignant neoplasm of breast: Secondary | ICD-10-CM

## 2016-08-27 DIAGNOSIS — M5030 Other cervical disc degeneration, unspecified cervical region: Secondary | ICD-10-CM | POA: Diagnosis present

## 2016-08-27 DIAGNOSIS — Z8249 Family history of ischemic heart disease and other diseases of the circulatory system: Secondary | ICD-10-CM | POA: Diagnosis not present

## 2016-08-27 DIAGNOSIS — M4802 Spinal stenosis, cervical region: Secondary | ICD-10-CM | POA: Diagnosis present

## 2016-08-27 DIAGNOSIS — K589 Irritable bowel syndrome without diarrhea: Secondary | ICD-10-CM | POA: Diagnosis present

## 2016-08-27 DIAGNOSIS — T464X5A Adverse effect of angiotensin-converting-enzyme inhibitors, initial encounter: Secondary | ICD-10-CM | POA: Diagnosis present

## 2016-08-27 DIAGNOSIS — Z9889 Other specified postprocedural states: Secondary | ICD-10-CM

## 2016-08-27 DIAGNOSIS — Z794 Long term (current) use of insulin: Secondary | ICD-10-CM

## 2016-08-27 DIAGNOSIS — R000 Tachycardia, unspecified: Secondary | ICD-10-CM | POA: Diagnosis present

## 2016-08-27 DIAGNOSIS — I10 Essential (primary) hypertension: Secondary | ICD-10-CM | POA: Diagnosis present

## 2016-08-27 DIAGNOSIS — K219 Gastro-esophageal reflux disease without esophagitis: Secondary | ICD-10-CM | POA: Diagnosis present

## 2016-08-27 DIAGNOSIS — R490 Dysphonia: Secondary | ICD-10-CM | POA: Diagnosis present

## 2016-08-27 DIAGNOSIS — Z87891 Personal history of nicotine dependence: Secondary | ICD-10-CM | POA: Diagnosis not present

## 2016-08-27 DIAGNOSIS — Z79899 Other long term (current) drug therapy: Secondary | ICD-10-CM

## 2016-08-27 DIAGNOSIS — M109 Gout, unspecified: Secondary | ICD-10-CM | POA: Diagnosis present

## 2016-08-27 LAB — CBC WITH DIFFERENTIAL/PLATELET
Basophils Absolute: 0.1 10*3/uL (ref 0–0.1)
Basophils Relative: 1 %
Eosinophils Absolute: 0.6 10*3/uL (ref 0–0.7)
Eosinophils Relative: 6 %
HEMATOCRIT: 51.6 % (ref 40.0–52.0)
HEMOGLOBIN: 17.6 g/dL (ref 13.0–18.0)
LYMPHS ABS: 3.4 10*3/uL (ref 1.0–3.6)
LYMPHS PCT: 32 %
MCH: 28.8 pg (ref 26.0–34.0)
MCHC: 34 g/dL (ref 32.0–36.0)
MCV: 84.6 fL (ref 80.0–100.0)
MONOS PCT: 7 %
Monocytes Absolute: 0.7 10*3/uL (ref 0.2–1.0)
NEUTROS ABS: 5.8 10*3/uL (ref 1.4–6.5)
NEUTROS PCT: 54 %
Platelets: 204 10*3/uL (ref 150–440)
RBC: 6.1 MIL/uL — ABNORMAL HIGH (ref 4.40–5.90)
RDW: 13.1 % (ref 11.5–14.5)
WBC: 10.7 10*3/uL — ABNORMAL HIGH (ref 3.8–10.6)

## 2016-08-27 LAB — BASIC METABOLIC PANEL
Anion gap: 10 (ref 5–15)
BUN: 18 mg/dL (ref 6–20)
CALCIUM: 9.6 mg/dL (ref 8.9–10.3)
CHLORIDE: 107 mmol/L (ref 101–111)
CO2: 21 mmol/L — AB (ref 22–32)
CREATININE: 0.97 mg/dL (ref 0.61–1.24)
GFR calc Af Amer: >60 mL/min (ref >60)
GFR calc non Af Amer: >60 mL/min (ref >60)
GLUCOSE: 156 mg/dL — AB (ref 65–99)
Potassium: 4.1 mmol/L (ref 3.5–5.1)
Sodium: 138 mmol/L (ref 135–145)

## 2016-08-27 LAB — PHOSPHORUS: Phosphorus: 1.9 mg/dL — ABNORMAL LOW (ref 2.5–4.6)

## 2016-08-27 LAB — MAGNESIUM: Magnesium: 2 mg/dL (ref 1.7–2.4)

## 2016-08-27 MED ORDER — FAMOTIDINE IN NACL 20-0.9 MG/50ML-% IV SOLN
20.0000 mg | Freq: Two times a day (BID) | INTRAVENOUS | Status: DC
  Administered 2016-08-27 – 2016-08-28 (×2): 20 mg via INTRAVENOUS
  Filled 2016-08-27 (×2): qty 50

## 2016-08-27 MED ORDER — ATORVASTATIN CALCIUM 20 MG PO TABS
20.0000 mg | ORAL_TABLET | Freq: Every day | ORAL | Status: DC
  Filled 2016-08-27: qty 1

## 2016-08-27 MED ORDER — INSULIN ASPART 100 UNIT/ML ~~LOC~~ SOLN
0.0000 [IU] | SUBCUTANEOUS | Status: DC
  Administered 2016-08-28: 3 [IU] via SUBCUTANEOUS
  Administered 2016-08-28: 2 [IU] via SUBCUTANEOUS
  Administered 2016-08-28: 3 [IU] via SUBCUTANEOUS
  Filled 2016-08-27: qty 2
  Filled 2016-08-27 (×2): qty 3

## 2016-08-27 MED ORDER — SENNOSIDES-DOCUSATE SODIUM 8.6-50 MG PO TABS: 1.0000 | ORAL_TABLET | Freq: Every evening | ORAL | Status: DC | PRN

## 2016-08-27 MED ORDER — ONDANSETRON HCL 4 MG PO TABS: 4.0000 mg | ORAL_TABLET | Freq: Four times a day (QID) | ORAL | Status: DC | PRN

## 2016-08-27 MED ORDER — DEXAMETHASONE SODIUM PHOSPHATE 10 MG/ML IJ SOLN
INTRAMUSCULAR | Status: AC
  Filled 2016-08-27: qty 1

## 2016-08-27 MED ORDER — MAGNESIUM CITRATE PO SOLN: 1.0000 | Freq: Once | ORAL | Status: DC | PRN

## 2016-08-27 MED ORDER — DEXAMETHASONE SODIUM PHOSPHATE 10 MG/ML IJ SOLN
10.0000 mg | Freq: Four times a day (QID) | INTRAMUSCULAR | Status: DC
  Administered 2016-08-28 (×3): 10 mg via INTRAVENOUS
  Filled 2016-08-27 (×4): qty 1

## 2016-08-27 MED ORDER — CYCLOBENZAPRINE HCL 10 MG PO TABS: 10.0000 mg | ORAL_TABLET | Freq: Three times a day (TID) | ORAL | Status: DC | PRN

## 2016-08-27 MED ORDER — DIPHENHYDRAMINE HCL 50 MG/ML IJ SOLN: 50.0000 mg | Freq: Once | INTRAMUSCULAR | Status: DC

## 2016-08-27 MED ORDER — FAMOTIDINE IN NACL 20-0.9 MG/50ML-% IV SOLN
INTRAVENOUS | Status: AC
  Administered 2016-08-27: 20 mg via INTRAVENOUS
  Filled 2016-08-27: qty 50

## 2016-08-27 MED ORDER — ZOLPIDEM TARTRATE 5 MG PO TABS: 5.0000 mg | ORAL_TABLET | Freq: Every evening | ORAL | Status: DC | PRN

## 2016-08-27 MED ORDER — DIPHENHYDRAMINE HCL 50 MG/ML IJ SOLN
50.0000 mg | Freq: Once | INTRAMUSCULAR | Status: AC
  Administered 2016-08-27: 50 mg via INTRAVENOUS

## 2016-08-27 MED ORDER — FAMOTIDINE IN NACL 20-0.9 MG/50ML-% IV SOLN
20.0000 mg | Freq: Once | INTRAVENOUS | Status: AC
  Administered 2016-08-27: 20 mg via INTRAVENOUS

## 2016-08-27 MED ORDER — SODIUM CHLORIDE 0.9 % IV SOLN
INTRAVENOUS | Status: DC
  Administered 2016-08-28: via INTRAVENOUS

## 2016-08-27 MED ORDER — DIPHENHYDRAMINE HCL 50 MG/ML IJ SOLN
50.0000 mg | Freq: Four times a day (QID) | INTRAMUSCULAR | Status: DC
  Administered 2016-08-28 (×3): 50 mg via INTRAVENOUS
  Filled 2016-08-27 (×3): qty 1

## 2016-08-27 MED ORDER — INSULIN GLARGINE 100 UNIT/ML ~~LOC~~ SOLN
20.0000 [IU] | Freq: Every day | SUBCUTANEOUS | Status: DC
  Administered 2016-08-28: 20 [IU] via SUBCUTANEOUS
  Filled 2016-08-27 (×2): qty 0.2

## 2016-08-27 MED ORDER — IBUPROFEN 400 MG PO TABS
800.0000 mg | ORAL_TABLET | Freq: Three times a day (TID) | ORAL | Status: DC
  Administered 2016-08-27 – 2016-08-28 (×2): 800 mg via ORAL
  Filled 2016-08-27 (×2): qty 2

## 2016-08-27 MED ORDER — ALLOPURINOL 100 MG PO TABS
100.0000 mg | ORAL_TABLET | Freq: Every day | ORAL | Status: DC
  Administered 2016-08-28: 100 mg via ORAL
  Filled 2016-08-27: qty 1

## 2016-08-27 MED ORDER — SODIUM CHLORIDE 0.9% FLUSH
3.0000 mL | Freq: Two times a day (BID) | INTRAVENOUS | Status: DC
  Administered 2016-08-28: 3 mL via INTRAVENOUS

## 2016-08-27 MED ORDER — ONDANSETRON HCL 4 MG/2ML IJ SOLN: 4.0000 mg | Freq: Four times a day (QID) | INTRAMUSCULAR | Status: DC | PRN

## 2016-08-27 MED ORDER — ENOXAPARIN SODIUM 40 MG/0.4ML ~~LOC~~ SOLN
40.0000 mg | Freq: Every day | SUBCUTANEOUS | Status: DC
  Administered 2016-08-28: 40 mg via SUBCUTANEOUS
  Filled 2016-08-27: qty 0.4

## 2016-08-27 MED ORDER — ASPIRIN EC 81 MG PO TBEC
81.0000 mg | DELAYED_RELEASE_TABLET | Freq: Every day | ORAL | Status: DC
  Filled 2016-08-27: qty 1

## 2016-08-27 MED ORDER — DEXAMETHASONE SODIUM PHOSPHATE 10 MG/ML IJ SOLN
10.0000 mg | Freq: Once | INTRAMUSCULAR | Status: AC
  Administered 2016-08-27: 10 mg via INTRAVENOUS

## 2016-08-27 MED ORDER — BISACODYL 5 MG PO TBEC: 5.0000 mg | DELAYED_RELEASE_TABLET | Freq: Every day | ORAL | Status: DC | PRN

## 2016-08-27 MED ORDER — ACETAMINOPHEN 650 MG RE SUPP
650.0000 mg | Freq: Four times a day (QID) | RECTAL | Status: DC | PRN
Start: 2016-08-27 — End: 2016-08-28

## 2016-08-27 MED ORDER — ACETAMINOPHEN 325 MG PO TABS
650.0000 mg | ORAL_TABLET | Freq: Four times a day (QID) | ORAL | Status: DC | PRN
Start: 2016-08-27 — End: 2016-08-28

## 2016-08-27 MED ORDER — DIPHENHYDRAMINE HCL 50 MG/ML IJ SOLN: 25.0000 mg | Freq: Once | INTRAMUSCULAR | Status: DC

## 2016-08-27 MED ORDER — DIPHENHYDRAMINE HCL 50 MG/ML IJ SOLN
INTRAMUSCULAR | Status: AC
  Filled 2016-08-27: qty 1

## 2016-08-27 MED ORDER — LORATADINE 10 MG PO TABS
10.0000 mg | ORAL_TABLET | Freq: Every day | ORAL | Status: DC
  Filled 2016-08-27: qty 1

## 2016-08-27 NOTE — ED Notes (Signed)
Pt and family updated on delay for admission bed. Pt and family verbalize understanding.

## 2016-08-27 NOTE — ED Notes (Signed)
resps unlabored, pt very anxious regarding upcoming laryngoscope exam. Family remains at bedside. Pt able to speak in full clear sentences, no difficulty swallowing noted. Skin normal color warm and dry. Will continue to monitor.

## 2016-08-27 NOTE — ED Notes (Signed)
Pt reports no increase in sensation of throat swelling. Equipment at bedside for emergency airway. Pt updated on wait time for ENT. Pt able to speak in full sentences.

## 2016-08-27 NOTE — ED Notes (Signed)
hospitalist in to see pt.

## 2016-08-27 NOTE — ED Notes (Signed)
Dr. Kelton Pillar, ent here to assess pt.

## 2016-08-27 NOTE — ED Notes (Signed)
Pt reports improved sensation of lip swelling. Pt continues to be able to speak in clear sentences and has no difficulty swallowing. resps remain unlabored.

## 2016-08-27 NOTE — ED Notes (Signed)
Pt continues to be able to speak in full sentences, no resp distress noted. Cont pox in place. Family remains at bedside.

## 2016-08-27 NOTE — ED Notes (Signed)
Pt tolerated laryngoscope exam well.

## 2016-08-27 NOTE — ED Triage Notes (Signed)
Patient presents to the ED with angioedema to face.  Patient's lips and cheeks are swollen, patient is tachycardic.  Patient reports history of facial swelling but reports it hasn't happened in 10 years.  Patient was recently put on lisinopril.  Patient's voice sounds slightly garbled and patient is clearing his throat in triage.

## 2016-08-27 NOTE — Consult Note (Signed)
Otolaryngology Emergency Department/Inpatient Consult  [place], [place] [id number] 10-05-1971 Orbie Pyo,*  Reason for Consult: Angioedema  HPI: John Hartman is a 45 year old male with a history of DM, HTN, gout, spinal stenosis, IBS, cervical spine issues and HTN who was recently placed on lisinopril who presents with oral swelling and voice changes. He first noticed swelling of his lower lip last night and then this morning his upper lip was edematous. He reports a course voice and an unusual sensation in his lower left neck. He feels a gagging sensation. No dysphagia. No dyspnea, though he endorses anxiety. Of note, he has a history of recurrent angioedema for several decades. He says this was worked up by a rheumatologist and a diagnosis of hereditary angioedema was never made. However, for the last decade he has taken benadryl, zantac and zyrtec before bed and has done well over this time. He is accompanied by his wife and son. He has been treated with benadryl and decadron since arrival.   Allergies: No Known Allergies  ROS: 12 point review of systems normal other than right ear pain per HPI.   PMH:  Past Medical History:  Diagnosis Date  . Angioedema   . DDD (degenerative disc disease), cervical   . Diabetes mellitus ([place])   . GERD (gastroesophageal reflux disease)   . Gout   . HTN (hypertension)   . Hypertension    CONTROLLED ON MEDS  . IBS (irritable bowel syndrome)   . Seasonal allergies   . Spinal stenosis     FH:  Family History  Problem Relation Age of Onset  . Breast cancer Mother   . Skin cancer Mother   . Arrhythmia Mother     A-fib  . Cancer Mother     breast, colon?, skin  . Hypertension Father   . Cancer Maternal Grandfather   . Diabetes Neg Hx   . Heart disease Neg Hx   . Stroke Neg Hx   . COPD Neg Hx     SH:  Social History   Social History  . Marital status: Married    Spouse name: N/A  . Number of children: N/A  . Years of education:  N/A   Occupational History  . Not on file.   Social History Main Topics  . Smoking status: Former Smoker    Packs/day: 1.00    Years: 10.00    Types: Cigarettes    Quit date: 04/15/2015  . Smokeless tobacco: Never Used  . Alcohol use No     Comment: Rare consumption  . Drug use: No  . Sexual activity: Not on file   Other Topics Concern  . Not on file   Social History Narrative  . No narrative on file    PSH:  Past Surgical History:  Procedure Laterality Date  . ACDF with fusion  2007  . CARDIAC CATHETERIZATION Left 08/15/2016   Procedure: Left Heart Cath and Coronary Angiography;  Surgeon: Wellington Hampshire, MD;  Location: [facility];  Service: Cardiovascular;  Laterality: Left;  . COLONOSCOPY WITH PROPOFOL N/A 06/26/2015   Procedure: COLONOSCOPY WITH PROPOFOL;  Surgeon: Lucilla Lame, MD;  Location: [facility];  Service: Endoscopy;  Laterality: N/A;  with biopsies  . NASAL SEPTUM SURGERY  2004  . SHOULDER SURGERY Left 2007  . Testicular torsion  1980s  . VASECTOMY      Physical  Exam:  Gen: Awake and alert. Anxious.  Eyes: Grossly normal position and movement Ears: Auricles  normal without lesions.  Nose: Anterior rhinoscopy shows no edema or septal deviation Oral cavity: Tongue midline, no lesions, normal dentition. Moderate upper lip edema. No lower lip, tongue, or floor of mouth edema.  Oropharynx: Symmetric tonsils. Elongated but thin uvula. Neck: Soft and flat without masses, no thyroid asymmetry or masses, no lymphadenopathy Cardio: Regular rate, no cyanosis Resp: Breathing easily without labor, no stridor or stertor Neuro: Cranial nerves II-XII intact and symmetric Psych: Appropriate mood and affect   Labs: None  Imaging: None  Procedures: TRANSNASAL FIBEROPTIC LARYNGOSCOPY  Pre-Procedure Diagnosis: Angioedema Post-Procedure Diagnosis: Same Anesthesia: 1:1 mixture of 4% lidocaine and 0.05% oxymetazoline Indications: Patient presented  with upper lip edema, hoarseness and globus several weeks after starting an ACEI.   Findings: The right nasal cavity and nasopharynx are normal without obstructions or lesions. The posterior palatine tonsils, tongue base and vallecula are normal, except the uvula can be seen sitting on the tongue base. The epiglottis is crisp and thin. The arytenoids, AE folds, false folds, true folds, post-cricoid region and immediate subglottis are normal appearing without edema. The right piriform sinus is normal, however, there is mild effacement of the left piriform sinus. There is normal vocal fold function bilaterally and no glottal insufficiency.   Procedure: The procedure was explained to the patient and verbal consent was obtained. The nose was topically anesthetized with 2cc of a 1:1 mixture of 4% lidocaine and 0.05% oxymetazoline. A small amount of lubricant was applied to the end the flexible laryngoscope. With the patient seated and in a sniffing position, the laryngoscope was passed through the right nare and the nasopharynx, oropharynx, hypopharynx and larynx were evaluated.   Impression: Elongated uvula and moderate effacement of the left piriform sinus. Overall, widely patent and safe airway.    A/P: John Hartman is a 45 year old male with ACEI-induced angioedema. Edema is currently limited to his upper lip, uvula and possible the left lateral hypophaynx. Overall, his airway is widely patent and safe. His gaging sensation is likely from his uvula sitting on his tongue base. Recommend overnight observation in the step down unit. Would continue benadryl. Would continue decadron as long as the benefit outweighs the risk of glucose issues related to his DM. Would not advance diet beyond sips/chips overnight in case his airway worsens. I will see the patient in the morning and reassess the airway. As long as he does well, he can likely be discharged tomorrow. Please call if any concerns between now and then.      Nadira Single C 08/27/2016 8:24 PM

## 2016-08-27 NOTE — ED Notes (Signed)
Report from [place], rn. Introduced self to pt and family, call bell provided. Pt sitting up in bed with hob at 80 degrees, no resp distress noted. Pt anxious, able to speak in full sentences with swelling noted to bilateral lips. Pt states has sensation of tongue swelling and "uvula" swelling. Pt able to swallow without difficulty. Family at bedside, cardiac monitor and cont pox in place. Emergency airway equipment to bedside, suction and ambu bag set up.

## 2016-08-27 NOTE — H&P (Signed)
[place] @ [facility] Admission History and Physical Harvie Bridge, D.O.  ---------------------------------------------------------------------------------------------------------------------   PATIENT NAME: John Hartman MR#: [id number] DATE OF BIRTH: [birth year] DATE OF ADMISSION: 08/27/2016 PRIMARY CARE PHYSICIAN: Enid Derry, MD  REQUESTING/REFERRING PHYSICIAN: ED Dr. Clearnce Hasten  CHIEF COMPLAINT: Chief Complaint  Patient presents with  . Angioedema    HISTORY OF PRESENT ILLNESS: John Hartman is a 45 y.o. male with a known history of angioedemia, DM, HTN, HTN was in a usual state of health until Last night when he began experiencing lower lip tingling in the middle of the night. Symptoms worsened over the course of the night. He reports upper and lower lip swelling, tongue swelling, sensation of gagging, shortness of breath and feeling like the back of his throat was starting to swell. He took Benadryl at home prior to coming to the emergency department. In the emergency department he received Pepcid Benadryl and Decadron and is starting to feel his symptoms improving. He was evaluated by ENT who recommended admission to stepdown for observation, Decadron and Benadryl.  Of note patient has had angioedema since his early 65s. He had a 10 year period of time where he was having regular episodes but did not seek medical attention. For the past 10 years he has been taking Benadryl and ranitidine before bed and has had infrequent flareups however he was started on lisinopril several weeks ago. During the first week that he was taking it he had some mild swelling in the back of his throat which resolved continue taking the medication as prescribed. He also reports hives intermittently not necessarily associated with his tongue and lip swelling.  He has been worked up by a rheumatologist but has not seen an allergist. 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  Otherwise there has been no change in status. Patient has been taking medication as prescribed and there has been no recent change in medication or diet.  There has been no recent illness, travel or sick contacts.    Patient denies fevers/chills, weakness, dizziness, chest pain, shortness of breath, N/V/C/D, abdominal pain, dysuria/frequency, changes in mental status.   EMS/ED COURSE:   Patient received Benadryl Decadron and Pepcid  PAST MEDICAL HISTORY: Past Medical History:  Diagnosis Date  . Angioedema   . DDD (degenerative disc disease), cervical   . Diabetes mellitus ([place])   . GERD (gastroesophageal reflux disease)   . Gout   . HTN (hypertension)   . Hypertension    CONTROLLED ON MEDS  . IBS (irritable bowel syndrome)   . Seasonal allergies   . Spinal stenosis       PAST SURGICAL HISTORY: Past Surgical History:  Procedure Laterality Date  . ACDF with fusion  2007  . CARDIAC CATHETERIZATION Left 08/15/2016   Procedure: Left Heart Cath and Coronary Angiography;  Surgeon: Wellington Hampshire, MD;  Location: [place] [facility];  Service: Cardiovascular;  Laterality: Left;  . COLONOSCOPY WITH PROPOFOL N/A 06/26/2015   Procedure: COLONOSCOPY WITH PROPOFOL;  Surgeon: Lucilla Lame, MD;  Location: [place];  Service: Endoscopy;  Laterality: N/A;  with biopsies  . NASAL SEPTUM SURGERY  2004  . SHOULDER SURGERY Left 2007  . Testicular torsion  1980s  . VASECTOMY        SOCIAL HISTORY: Social History  Substance Use Topics  . Smoking status: Former Smoker    Packs/day: 1.00    Years: 10.00    Types: Cigarettes    Quit date:  04/15/2015  . Smokeless tobacco: Never Used  . Alcohol use No     Comment: Rare consumption      FAMILY HISTORY: Family History  Problem Relation Age of Onset  . Breast cancer Mother   . Skin cancer Mother   . Arrhythmia Mother     A-fib  . Cancer Mother     breast, colon?, skin  . Hypertension Father   . Cancer Maternal  Grandfather   . Diabetes Neg Hx   . Heart disease Neg Hx   . Stroke Neg Hx   . COPD Neg Hx      MEDICATIONS AT HOME: Prior to Admission medications   Medication Sig Start Date End Date Taking? Authorizing Provider  ACCU-CHEK SOFTCLIX LANCETS lancets USE AS DIRECTED 07/23/16  Yes Arnetha Courser, MD  allopurinol (ZYLOPRIM) 100 MG tablet Take 100 mg by mouth every morning.    Yes Historical Provider, MD  atorvastatin (LIPITOR) 20 MG tablet Take 1 tablet (20 mg total) by mouth at bedtime. (if you take colchicine, do NOT take this medicine for 3 days) 08/25/16  Yes Arnetha Courser, MD  belladonna-PHENObarbital (DONNATAL) 16.2 MG/5ML ELIX Take 5 mLs (16.2 mg total) by mouth 4 (four) times daily as needed for cramping or pain. Patient taking differently: Take 5 mLs by mouth 4 (four) times daily as needed for cramping or pain.  05/28/15  Yes Lucilla Lame, MD  cetirizine (ZYRTEC) 10 MG tablet Take 10 mg by mouth at bedtime.    Yes Historical Provider, MD  colchicine 0.6 MG tablet Take 0.6 mg by mouth daily as needed.   Yes Historical Provider, MD  cyclobenzaprine (FLEXERIL) 10 MG tablet Take 1 tablet (10 mg total) by mouth every 8 (eight) hours as needed for muscle spasms. 08/01/16  Yes Arnetha Courser, MD  diphenhydrAMINE (BENADRYL) 25 mg capsule Take 25 mg by mouth at bedtime.    Yes Historical Provider, MD  EPINEPHrine (EPIPEN 2-PAK) 0.3 mg/0.3 mL IJ SOAJ injection Inject 0.3 mg into the muscle once.   Yes Historical Provider, MD  glucose blood (ACCU-CHEK ACTIVE STRIPS) test strip Use as instructed 07/09/16  Yes Arnetha Courser, MD  glucose blood (ACCU-CHEK AVIVA PLUS) test strip Check FSBS three to four times per day 07/23/16  Yes Arnetha Courser, MD  Insulin Degludec (TRESIBA FLEXTOUCH) 100 UNIT/ML SOPN Inject 20 Units into the skin at bedtime.  07/12/16  Yes Arnetha Courser, MD  Insulin Pen Needle (B-D UF III MINI PEN NEEDLES) 31G X 5 MM MISC Once a day with insulin 07/09/16  Yes Arnetha Courser, MD  lisinopril  (PRINIVIL,ZESTRIL) 10 MG tablet Take 10 mg by mouth at bedtime.   Yes Historical Provider, MD  Melatonin 5 MG TABS Take 5 mg by mouth at bedtime.    Yes Historical Provider, MD  metFORMIN (GLUCOPHAGE-XR) 500 MG 24 hr tablet Take 1,500 mg by mouth every evening. Take with supper. 07/15/16 07/15/17 Yes Historical Provider, MD  ranitidine (ZANTAC) 150 MG capsule Take 150 mg by mouth at bedtime.    Yes Historical Provider, MD  traMADol (ULTRAM) 50 MG tablet Take 50 mg by mouth every 8 (eight) hours as needed for moderate pain or severe pain.   Yes Historical Provider, MD  ibuprofen (ADVIL,MOTRIN) 800 MG tablet Take 1 tablet (800 mg total) by mouth 3 (three) times daily. Patient not taking: Reported on 08/27/2016 07/11/16   Norval Gable, MD      DRUG ALLERGIES: No Known Allergies  REVIEW OF SYSTEMS: CONSTITUTIONAL: No fever/chills, fatigue, weakness, weight gain/loss, headache EYES: No blurry or double vision. ENT: No tinnitus, postnasal drip, redness or soreness of the oropharynx.Positive lip, tongue and throat swelling. Positive shortness of breath RESPIRATORY: No cough, wheeze, hemoptysis. Positive shortness of breath CARDIOVASCULAR: No chest pain, orthopnea, palpitations, syncope. GASTROINTESTINAL: No nausea, vomiting, constipation, diarrhea, abdominal pain, hematemesis, melena or hematochezia. GENITOURINARY: No dysuria or hematuria. ENDOCRINE: No polyuria or nocturia. No heat or cold intolerance. HEMATOLOGY: No anemia, bruising, bleeding. INTEGUMENTARY: No rashes, ulcers, lesions. MUSCULOSKELETAL: No arthritis, swelling, gout. NEUROLOGIC: No numbness, tingling, weakness or ataxia. No seizure-type activity. PSYCHIATRIC: No anxiety, depression, insomnia.  PHYSICAL EXAMINATION: VITAL SIGNS: Blood pressure 127/88, pulse (!) 116, temperature 98.5 F (36.9 C), temperature source Oral, resp. rate (!) 21, height 6' (1.829 m), weight 97.5 kg (215 lb), SpO2 96 %.  GENERAL: 45 y.o.-year-old white  male patient, well-developed, well-nourished lying in the bed in no acute distress.  Pleasant and cooperative.   HEENT: Head atraumatic, normocephalic. Pupils equal, round, reactive to light and accommodation. No scleral icterus. Extraocular muscles intact. Nares are patent. Oropharynx is clear. Mucus membranes moist.  Moderate upper and lower lip swelling. No tongue swelling. Posterior pharynx appears normal. NECK: Supple, full range of motion. No JVD, no bruit heard. No thyroid enlargement, no tenderness, no cervical lymphadenopathy. CHEST: Normal breath sounds bilaterally. No wheezing, rales, rhonchi or crackles. No use of accessory muscles of respiration.  No reproducible chest wall tenderness.  CARDIOVASCULAR: S1, S2 normal. No murmurs, rubs, or gallops. Cap refill <2 seconds. ABDOMEN: Soft, nontender, nondistended. No rebound, guarding, rigidity. Normoactive bowel sounds present in all four quadrants. No organomegaly or mass. EXTREMITIES: Full range of motion. No pedal edema, cyanosis, or clubbing. NEUROLOGIC: Cranial nerves II through XII are grossly intact with no focal sensorimotor deficit. Muscle strength 5/5 in all extremities. Sensation intact. Gait not checked. PSYCHIATRIC: The patient is alert and oriented x 3. Normal affect, mood, thought content. SKIN: Warm, dry, and intact without obvious rash, lesion, or ulcer.  LABORATORY PANEL:  CBC  Recent Labs Lab 08/27/16 1836  WBC 10.7*  HGB 17.6  HCT 51.6  PLT 204   ----------------------------------------------------------------------------------------------------------------- Chemistries  Recent Labs Lab 08/27/16 2112  NA 138  K 4.1  CL 107  CO2 21*  GLUCOSE 156*  BUN 18  CREATININE 0.97  CALCIUM 9.6   ------------------------------------------------------------------------------------------------------------------ Cardiac Enzymes No results for input(s): TROPONINI in the last 168  hours. ------------------------------------------------------------------------------------------------------------------  RADIOLOGY: No results found.  IMPRESSION AND PLAN:  This is a 45 y.o. male with a history of idiopathic urticaria and angioedema diabetes, hypertension now being admitted with: 1. Angioedema likely secondary to ACE inhibitor -Admit to stepdown for observation -IV Benadryl every 6 hours IV Decadron every 6 hours and IV Pepcid every 12 hours -ENT consult was obtained in the emergency department and will see the patient again tomorrow. -Nothing by mouth except for sips of water 2. Diabetes mellitus- -We'll cover the patient with his evening long-acting insulin and will utilize regular insulin sliding scale coverage every 4 hours. 3. Hypertension -We will be discontinuing lisinopril. May consider switching to beta blocker given his tachycardia Otherwise continue home medication  Diet/Nutrition: Nothing by mouth except for sips of water Fluids: IV normal saline DVT Px: Lovenox, SCDs and early ambulation Code Status: Full  All the records are reviewed and case discussed with ED provider. Management plans discussed with the patient and/or family who express understanding and agree with plan of care.  TOTAL TIME TAKING CARE OF THIS PATIENT: 65 minutes.   John Hartman D.O. on 08/27/2016 at 9:44 PM Between 7am to 6pm - Pager - [phone] After 6pm go to [email or web address] - Proofreader [employer] [place] [facility] Office [phone] CC: Primary care physician; Enid Derry, MD     Note: This dictation was prepared with Dragon dictation along with smaller phrase technology. Any transcriptional errors that result from this process are unintentional.

## 2016-08-27 NOTE — ED Provider Notes (Signed)
[facility] Emergency Department Provider Note   ____________________________________________   First MD Initiated Contact with Patient 08/27/16 1840     (approximate)  I have reviewed the triage vital signs and the nursing notes.   HISTORY  Chief Complaint Angioedema   HPI John Hartman is a 45 y.o. male with a history of angioedema who recently started lisinopril several weeks ago. The patient says that he has had episodes of angioedema on and off since he was in his 64s. He says that he was put on Benadryl as well as a second generation antihistamine for preventative measures but says that he has still had infrequent flares is angioedema around his eyes as well as to his lips. However, he says he has not had any angioedema that he has felt as if he is breathing more been in the back of his throat or mouth. About a half an hour prior to arrival he says that he started to feel swelling in the back of his throat as well as his upper lip. He says that he did have some mild angioedema to the lower lip earlier today but that is resolved. He took a dose of Benadryl earlier today without any resolution of the symptoms. Says that the first week he was taking his lisinopril he had some mild swelling to his uvula but that resolved spontaneously and he continued taking the lisinopril as prescribed.Denies any rash.   Past Medical History:  Diagnosis Date  . Angioedema   . DDD (degenerative disc disease), cervical   . Diabetes mellitus ([place])   . GERD (gastroesophageal reflux disease)   . Gout   . HTN (hypertension)   . Hypertension    CONTROLLED ON MEDS  . IBS (irritable bowel syndrome)   . Seasonal allergies   . Spinal stenosis     Patient Active Problem List   Diagnosis Date Noted  . Encounter for medication monitoring 08/25/2016  . Coronary artery disease 08/25/2016  . Unstable angina ([place])   . Diabetes mellitus ([place])   . Chest pain 08/08/2016  .  Preventative health care 08/02/2016  . Ketonuria 07/12/2016  . Glucosuria 07/12/2016  . New onset type 1 diabetes mellitus, uncontrolled ([place]) 07/08/2016  . 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 Palpitations 08/12/2015  . Family history of malignant neoplasm of gastrointestinal tract   . Benign neoplasm of rectosigmoid junction   . Essential hypertension 05/28/2015  . Gout 05/28/2015  . IBS (irritable bowel syndrome) 05/28/2015    Past Surgical History:  Procedure Laterality Date  . ACDF with fusion  2007  . CARDIAC CATHETERIZATION Left 08/15/2016   Procedure: Left Heart Cath and Coronary Angiography;  Surgeon: Wellington Hampshire, MD;  Location: [place] [facility];  Service: Cardiovascular;  Laterality: Left;  . COLONOSCOPY WITH PROPOFOL N/A 06/26/2015   Procedure: COLONOSCOPY WITH PROPOFOL;  Surgeon: Lucilla Lame, MD;  Location: [place];  Service: Endoscopy;  Laterality: N/A;  with biopsies  . NASAL SEPTUM SURGERY  2004  . SHOULDER SURGERY Left 2007  . Testicular torsion  1980s  . VASECTOMY      Prior to Admission medications   Medication Sig Start Date End Date Taking? Authorizing Provider  ACCU-CHEK SOFTCLIX LANCETS lancets USE AS DIRECTED 07/23/16   Arnetha Courser, MD  allopurinol (ZYLOPRIM) 100 MG tablet Take 100 mg by mouth daily.    Historical Provider, MD  aspirin EC 81 MG tablet Take 1 tablet (81 mg total) by mouth daily. 08/11/16  Arnetha Courser, MD  atorvastatin (LIPITOR) 20 MG tablet Take 1 tablet (20 mg total) by mouth at bedtime. (if you take colchicine, do NOT take this medicine for 3 days) 08/25/16   Arnetha Courser, MD  belladonna-PHENObarbital (DONNATAL) 16.2 MG/5ML ELIX Take 5 mLs (16.2 mg total) by mouth 4 (four) times daily as needed for cramping or pain. Patient taking differently: Take 5 mLs by mouth 4 (four) times daily as needed for cramping or pain.  05/28/15   Lucilla Lame, MD  cetirizine (ZYRTEC) 10 MG tablet Take 10 mg by mouth daily.    Historical  Provider, MD  colchicine 0.6 MG tablet Take 0.6 mg by mouth daily as needed.    Historical Provider, MD  cyclobenzaprine (FLEXERIL) 10 MG tablet Take 1 tablet (10 mg total) by mouth every 8 (eight) hours as needed for muscle spasms. 08/01/16   Arnetha Courser, MD  diphenhydrAMINE (BENADRYL) 25 mg capsule Take 25 mg by mouth daily.     Historical Provider, MD  EPINEPHrine (EPIPEN 2-PAK) 0.3 mg/0.3 mL IJ SOAJ injection Inject 0.3 mg into the muscle once.    Historical Provider, MD  glucose blood (ACCU-CHEK ACTIVE STRIPS) test strip Use as instructed 07/09/16   Arnetha Courser, MD  glucose blood (ACCU-CHEK AVIVA PLUS) test strip Check FSBS three to four times per day 07/23/16   Arnetha Courser, MD  ibuprofen (ADVIL,MOTRIN) 800 MG tablet Take 1 tablet (800 mg total) by mouth 3 (three) times daily. 07/11/16   Norval Gable, MD  Insulin Degludec (TRESIBA FLEXTOUCH) 100 UNIT/ML SOPN Inject 20 Units into the skin at bedtime.  07/12/16   Arnetha Courser, MD  Insulin Pen Needle (B-D UF III MINI PEN NEEDLES) 31G X 5 MM MISC Once a day with insulin 07/09/16   Arnetha Courser, MD  Melatonin 5 MG TABS Take 5 mg by mouth at bedtime.     Historical Provider, MD  metFORMIN (GLUCOPHAGE-XR) 500 MG 24 hr tablet Take 1,500 mg by mouth daily.  07/15/16 07/15/17  Historical Provider, MD  ranitidine (ZANTAC) 150 MG capsule Take 150 mg by mouth daily.    Historical Provider, MD    Allergies Review of patient's allergies indicates no known allergies.  Family History  Problem Relation Age of Onset  . Breast cancer Mother   . Skin cancer Mother   . Arrhythmia Mother     A-fib  . Cancer Mother     breast, colon?, skin  . Hypertension Father   . Cancer Maternal Grandfather   . Diabetes Neg Hx   . Heart disease Neg Hx   . Stroke Neg Hx   . COPD Neg Hx     Social History Social History  Substance Use Topics  . Smoking status: Former Smoker    Packs/day: 1.00    Years: 10.00    Types: Cigarettes    Quit date: 04/15/2015    . Smokeless tobacco: Never Used  . Alcohol use No     Comment: Rare consumption    Review of Systems Constitutional: No fever/chills Eyes: No visual changes. ENT: Feeling of throat closing. Cardiovascular: Denies chest pain. Respiratory: Denies shortness of breath. Gastrointestinal: No abdominal pain.  No nausea, no vomiting.  No diarrhea.  No constipation. Genitourinary: Negative for dysuria. Musculoskeletal: Negative for back pain. Skin: Negative for rash. Neurological: Negative for headaches, focal weakness or numbness.  10-point ROS otherwise negative.  ____________________________________________   PHYSICAL EXAM:  VITAL SIGNS: ED Triage Vitals  Enc Vitals Group     BP 08/27/16 1832 (!) 138/117     Pulse Rate 08/27/16 1832 (!) 139     Resp 08/27/16 1832 20     Temp 08/27/16 1832 98.5 F (36.9 C)     Temp Source 08/27/16 1832 Oral     SpO2 08/27/16 1832 99 %     Weight 08/27/16 1832 215 lb (97.5 kg)     Height 08/27/16 1832 6' (1.829 m)     Head Circumference --      Peak Flow --      Pain Score 08/27/16 1855 0     Pain Loc --      Pain Edu? --      Excl. in [place]? --     Constitutional: Alert and oriented. Well appearing and in no acute distress.Hoarse voice which the patient says is not normal for him. Eyes: Conjunctivae are normal. PERRL. EOMI. Head: Atraumatic. Nose: No congestion/rhinnorhea. Mouth/Throat: Mucous membranes are moist.  No tongue swelling or uvular swelling. Upper lip with mild to moderate swelling which is generalized. Neck: No stridor.  No masses. No stridor. Cardiovascular: Tachycardic, regular rhythm. Grossly normal heart sounds.   Respiratory: Normal respiratory effort.  No retractions. Lungs CTAB. Gastrointestinal: Soft and nontender. No distention.  Musculoskeletal: No lower extremity tenderness nor edema.  No joint effusions. Neurologic:  Normal speech and language. No gross focal neurologic deficits are appreciated. Skin:  Skin is  warm, dry and intact. No rash noted. Psychiatric: Mood and affect are normal. Speech and behavior are normal.  ____________________________________________   LABS (all labs ordered are listed, but only abnormal results are displayed)  Labs Reviewed - No data to display ____________________________________________  EKG   ____________________________________________  RADIOLOGY   ____________________________________________   PROCEDURES  Procedure(s) performed:   Procedures  Critical Care performed:   ____________________________________________   INITIAL IMPRESSION / ASSESSMENT AND PLAN / ED COURSE  Pertinent labs & imaging results that were available during my care of the patient were reviewed by me and considered in my medical decision making (see chart for details).  ----------------------------------------- 650 PM on 08/27/2016 ----------------------------------------- Discussed case with Dr. Linna Darner of ENT who will be coming to the hospital to evaluate the patient with fiberoptic scope. We will have all the airway Coumadin at the bedside as well as the fiberoptic scope in case the patient deteriorates. As of this time he says that he does not feel any worse and is still able to speak in full sentences with an oxygen saturation of 96-97% on room air.   Clinical Course   ----------------------------------------- 8:27 PM on 08/27/2016 -----------------------------------------  Dr. Linna Darner of ear nose and throat is at the bedside and has scoped the patient. He does not see any significant airway swelling but does see an elongated uvula. The patient does not have any worsening of his airway compromise but is still tachycardic with a hoarse voice. Otolaryngology recommends admission to the hospital overnight with sips and chips. Dr. Linna Darner will see the patient again in the morning.  Patient to be admitted to the hospitalist service on the step down unit. Will  continue with Decadron and Benadryl every 6hrs. No worsening of the upper lip swelling. No development of tongue swelling.  ____________________________________________   FINAL CLINICAL IMPRESSION(S) / ED DIAGNOSES  Angioedema    NEW MEDICATIONS STARTED DURING THIS VISIT:  New Prescriptions   No medications on file     Note:  This document was prepared using Dragon  voice recognition software and may include unintentional dictation errors.    Orbie Pyo, MD 08/27/16 2028

## 2016-08-28 ENCOUNTER — Other Ambulatory Visit: Payer: Self-pay | Admitting: Family Medicine

## 2016-08-28 DIAGNOSIS — T783XXD Angioneurotic edema, subsequent encounter: Secondary | ICD-10-CM

## 2016-08-28 LAB — BASIC METABOLIC PANEL
Anion gap: 9 (ref 5–15)
BUN: 18 mg/dL (ref 6–20)
CALCIUM: 9.4 mg/dL (ref 8.9–10.3)
CHLORIDE: 106 mmol/L (ref 101–111)
CO2: 21 mmol/L — AB (ref 22–32)
CREATININE: 0.93 mg/dL (ref 0.61–1.24)
GFR calc non Af Amer: >60 mL/min (ref >60)
GLUCOSE: 175 mg/dL — AB (ref 65–99)
Potassium: 4.2 mmol/L (ref 3.5–5.1)
Sodium: 136 mmol/L (ref 135–145)

## 2016-08-28 LAB — CBC
HCT: 47.7 % (ref 40.0–52.0)
Hemoglobin: 16.3 g/dL (ref 13.0–18.0)
MCH: 29 pg (ref 26.0–34.0)
MCHC: 34.2 g/dL (ref 32.0–36.0)
MCV: 84.9 fL (ref 80.0–100.0)
PLATELETS: 177 10*3/uL (ref 150–440)
RBC: 5.63 MIL/uL (ref 4.40–5.90)
RDW: 13.1 % (ref 11.5–14.5)
WBC: 6.8 10*3/uL (ref 3.8–10.6)

## 2016-08-28 LAB — GLUCOSE, CAPILLARY
GLUCOSE-CAPILLARY: 161 mg/dL — AB (ref 65–99)
GLUCOSE-CAPILLARY: 186 mg/dL — AB (ref 65–99)
Glucose-Capillary: 137 mg/dL — ABNORMAL HIGH (ref 65–99)

## 2016-08-28 LAB — MRSA PCR SCREENING: MRSA BY PCR: NEGATIVE

## 2016-08-28 MED ORDER — METOPROLOL TARTRATE 5 MG/5ML IV SOLN
5.0000 mg | Freq: Once | INTRAVENOUS | Status: AC
  Administered 2016-08-28: 5 mg via INTRAVENOUS
  Filled 2016-08-28: qty 5

## 2016-08-28 MED ORDER — DEXAMETHASONE 4 MG PO TABS: 4.0000 mg | ORAL_TABLET | Freq: Three times a day (TID) | ORAL | 0 refills | Status: AC

## 2016-08-28 MED ORDER — ATENOLOL 25 MG PO TABS
50.0000 mg | ORAL_TABLET | Freq: Every day | ORAL | Status: DC
  Administered 2016-08-28: 50 mg via ORAL
  Filled 2016-08-28: qty 2

## 2016-08-28 MED ORDER — ATENOLOL 50 MG PO TABS: 50.0000 mg | ORAL_TABLET | Freq: Every day | ORAL | 0 refills | Status: DC

## 2016-08-28 MED ORDER — OXYMETAZOLINE HCL 0.05 % NA SOLN
1.0000 | Freq: Once | NASAL | Status: AC
  Administered 2016-08-28: 1 via NASAL
  Filled 2016-08-28: qty 15

## 2016-08-28 MED ORDER — LIDOCAINE HCL (PF) 4 % IJ SOLN
4.0000 mL | Freq: Once | INTRAMUSCULAR | Status: DC
  Filled 2016-08-28: qty 5

## 2016-08-28 MED ORDER — METOPROLOL TARTRATE 25 MG PO TABS: 25.0000 mg | ORAL_TABLET | Freq: Two times a day (BID) | ORAL | 0 refills | Status: DC

## 2016-08-28 NOTE — Progress Notes (Signed)
Pt given discharge instructions. Pt verbalized understanding of instructions. 20gauge right hand discontinued. IV catheter intact. Pt taken via wheelchair by this RN to visitor entrance where wife picked pt up in private vehicle. Veta Dambrosia E 1:36 PM 08/28/2016

## 2016-08-28 NOTE — Progress Notes (Signed)
Discharge summary reviewed; referral to allergist/immunologist entered

## 2016-08-28 NOTE — Discharge Summary (Signed)
John Hartman, 45 y.o., DOB 1971-08-22, MRN [id number]. Admission date: 08/27/2016 Discharge Date 08/28/2016 Primary MD Enid Derry, MD Admitting Physician Harvie Bridge, DO  Admission Diagnosis  Angioedema, initial encounter [T78.3XXA]  Discharge Diagnosis   Active Problems:   Angioedema Essential hypertension Spinal stenosis Seasonal allergies Irritable bowel syndrome History of gout GERD Diabetes type 2         Hospital Course patient is a 45 year old male with history known history of hypertension diabetes angioedema who was in his usual state of health until yesterday night when he began experiencing lower lip tingling and swelling. Subsequently he started noticing his throat was swelling. Patient came to the ER with these symptoms. Was seen by ENT they recommended admission to the hospital and started him on Decadron and Benadryl. 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 What has not had any significant episode in 10 years. However he was recently started on lisinopril by his endocrinologist. This episode was felt to be due to ACE inhibitor. 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 This morning he is doing much better no further swelling was seen by ENT and was cleared to be discharged home. Patient was previously on atenolol for his high blood pressure which I will resume stop his lisinopril. His primary care needs to refer him to an outpatient allergist and immunologist for further workup for his likely angioedema.            Consults  ent  Significant Tests:  See full reports for all details    No results found.     Today   Subjective:   John Hartman  feels well no significant complaints no chest pains  Objective:   Blood pressure (!) 127/95, pulse (!) 108, temperature 97.8 F (36.6 C), temperature source Oral, resp. rate 16, height 6' (1.829 m), weight  216 lb 0.8 oz (98 kg), SpO2 97 %.  .  Intake/Output Summary (Last 24 hours) at 08/28/16 1043 Last data filed at 08/28/16 0600  Gross per 24 hour  Intake              820 ml  Output             1275 ml  Net             -455 ml    Exam VITAL SIGNS: Blood pressure (!) 127/95, pulse (!) 108, temperature 97.8 F (36.6 C), temperature source Oral, resp. rate 16, height 6' (1.829 m), weight 216 lb 0.8 oz (98 kg), SpO2 97 %.  GENERAL:  45 y.o.-year-old patient lying in the bed with no acute distress.  EYES: Pupils equal, round, reactive to light and accommodation. No scleral icterus. Extraocular muscles intact.  HEENT: Head atraumatic, normocephalic. Oropharynx and nasopharynx clear.  NECK:  Supple, no jugular venous distention. No thyroid enlargement, no tenderness.  LUNGS: Normal breath sounds bilaterally, no wheezing, rales,rhonchi or crepitation. No use of accessory muscles of respiration.  CARDIOVASCULAR: S1, S2 normal. No murmurs, rubs, or gallops.  ABDOMEN: Soft, nontender, nondistended. Bowel sounds present. No organomegaly or mass.  EXTREMITIES: No pedal edema, cyanosis, or clubbing.  NEUROLOGIC: Cranial nerves II through XII are intact. Muscle strength 5/5 in all extremities. Sensation intact. Gait not checked.  PSYCHIATRIC: The patient is alert and oriented x 3.  SKIN: No obvious rash, lesion, or ulcer.   Data Review     CBC w Diff:  Lab Results  Component  Value Date   WBC 6.8 08/28/2016   HGB 16.3 08/28/2016   HCT 47.7 08/28/2016   PLT 177 08/28/2016   LYMPHOPCT 32 08/27/2016   MONOPCT 7 08/27/2016   EOSPCT 6 08/27/2016   BASOPCT 1 08/27/2016   CMP:  Lab Results  Component Value Date   NA 136 08/28/2016   K 4.2 08/28/2016   CL 106 08/28/2016   CO2 21 (L) 08/28/2016   BUN 18 08/28/2016   CREATININE 0.93 08/28/2016   PROT 7.7 07/08/2016   ALBUMIN 4.6 07/08/2016   BILITOT 1.3 (H) 07/08/2016   ALKPHOS 123 07/08/2016   AST 41 07/08/2016   ALT 57 07/08/2016   .  Micro Results Recent Results (from the past 240 hour(s))  MRSA PCR Screening     Status: None   Collection Time: 08/27/16 10:45 PM  Result Value Ref Range Status   MRSA by PCR NEGATIVE NEGATIVE Final    Comment:        The GeneXpert MRSA Assay (FDA approved for NASAL specimens only), is one component of a comprehensive MRSA colonization surveillance program. It is not intended to diagnose MRSA infection nor to guide or monitor treatment for MRSA infections.         Code Status Orders        Start     Ordered   08/27/16 2245  Full code  Continuous     08/27/16 2244    Code Status History    Date Active Date Inactive Code Status Order ID Comments User Context   This patient has a current code status but no historical code status.          Follow-up Information    Enid Derry, MD Follow up in 5 day(s).   Specialty:  Family Medicine Contact information: [address] [place] [id number] [phone]           Discharge Medications     Medication List    STOP taking these medications   lisinopril 10 MG tablet Commonly known as:  PRINIVIL,ZESTRIL     TAKE these medications   ACCU-CHEK SOFTCLIX LANCETS lancets USE AS DIRECTED   allopurinol 100 MG tablet Commonly known as:  ZYLOPRIM Take 100 mg by mouth every morning.   atenolol 50 MG tablet Commonly known as:  TENORMIN Take 1 tablet (50 mg total) by mouth daily.   atorvastatin 20 MG tablet Commonly known as:  LIPITOR Take 1 tablet (20 mg total) by mouth at bedtime. (if you take colchicine, do NOT take this medicine for 3 days)   belladonna-PHENObarbital 16.2 MG/5ML Elix Commonly known as:  DONNATAL Take 5 mLs (16.2 mg total) by mouth 4 (four) times daily as needed for cramping or pain.   cetirizine 10 MG tablet Commonly known as:  ZYRTEC Take 10 mg by mouth at bedtime.   colchicine 0.6 MG tablet Take 0.6 mg by mouth daily as needed.   cyclobenzaprine 10 MG  tablet Commonly known as:  FLEXERIL Take 1 tablet (10 mg total) by mouth every 8 (eight) hours as needed for muscle spasms.   dexamethasone 4 MG tablet Commonly known as:  DECADRON Take 1 tablet (4 mg total) by mouth 3 (three) times daily.   diphenhydrAMINE 25 mg capsule Commonly known as:  BENADRYL Take 25 mg by mouth at bedtime.   EPIPEN 2-PAK 0.3 mg/0.3 mL Soaj injection Generic drug:  EPINEPHrine Inject 0.3 mg into the muscle once.   glucose blood test strip Commonly known  as:  ACCU-CHEK ACTIVE STRIPS Use as instructed   glucose blood test strip Commonly known as:  ACCU-CHEK AVIVA PLUS Check FSBS three to four times per day   ibuprofen 800 MG tablet Commonly known as:  ADVIL,MOTRIN Take 1 tablet (800 mg total) by mouth 3 (three) times daily.   Insulin Pen Needle 31G X 5 MM Misc Commonly known as:  B-D UF III MINI PEN NEEDLES Once a day with insulin   Melatonin 5 MG Tabs Take 5 mg by mouth at bedtime.   metFORMIN 500 MG 24 hr tablet Commonly known as:  GLUCOPHAGE-XR Take 1,500 mg by mouth every evening. Take with supper.   ranitidine 150 MG capsule Commonly known as:  ZANTAC Take 150 mg by mouth at bedtime.   traMADol 50 MG tablet Commonly known as:  ULTRAM Take 50 mg by mouth every 8 (eight) hours as needed for moderate pain or severe pain.   TRESIBA FLEXTOUCH 100 UNIT/ML Sopn Generic drug:  Insulin Degludec Inject 20 Units into the skin at bedtime.          Total Time in preparing paper work, data evaluation and todays exam - 35 minutes  Dustin Flock M.D on 08/28/2016 at 10:43 AM  [facility]   Office  [phone]

## 2016-08-28 NOTE — Progress Notes (Signed)
Otolaryngology Progress Note  Destined, Gariepy [id number] [birth year]  Subjective: Throat fullness and tingling resolved in the middle of the night and his upper lip swelling resolved this morning. He is no longer gagging. No dyspnea or dysphagia. His voice still feels a little hoarse.   Allergies:  Allergies  Allergen Reactions  . Ace Inhibitors Swelling    Physical  Exam:  Vitals:   08/28/16 0800 08/28/16 1001  BP: (!) 126/105 (!) 127/95  Pulse: (!) 109 (!) 108  Resp: 16   Temp:     Gen: Awake and alert, comfortable appearing. No longer anxious. Mild hoarseness.  Eyes: Grossly normal position and movement Ears: Auricles without lesions.  Nose: Anterior rhinoscopy shows no edema or septal deviation Oral cavity: Tongue midline, no lesions, normal dentition. Lips normal without edema.  Oropharynx: Symmetric tonsils. Uvula normal.  Neck: Soft and flat without masses, no thyroid asymmetry or masses, no lymphadenopathy Cardio: Regular rate, no cyanosis Resp: Breathing easily without labor, no stridor or stertor Neuro: Cranial nerves II-XII intact and symmetric Psych: Appropriate mood and affect   Labs: None  Imaging: None  Procedures: TRANSNASAL FIBEROPTIC LARYNGOSCOPY  Pre-Procedure Diagnosis: Angioedema Post-Procedure Diagnosis: Same Anesthesia: 1:1 mixture of 4% lidocaine and 0.05% oxymetazoline Indications: Mr. Goldwasser was admitted last night with angioedema and findings of upper lip, uvula and left piriform sinus edema. He was observed in the [facility] overnight and repeat laryngoscopy is indicated to ensure lower airway edema has resolved prior to resuming a diet and considering discharge home.   Findings: The right nasal cavity and nasopharynx are normal without obstructions or lesions. The posterior palatine tonsils, tongue base and vallecula are normal. The epiglottis is crisp and thin. The arytenoids, AE folds, false folds, true folds, post cricoid region, piriform sinuses and  immediate subglottis are normal appearing without masses or lesions. There is normal vocal fold function bilaterally and no glottal insufficiency.   Procedure: The procedure was explained to the patient and verbal consent was obtained. The nose was topically anesthetized with 1cc of 0.05% oxymetazoline. A small amount of lubricant was applied to the end the flexible laryngoscope. With the patient seated and in a sniffing position, the laryngoscope was passed through the right nare and the nasopharynx, oropharynx, hypopharynx and larynx were evaluated.   Impression: Normal TNFL with resolved uvular edema and left piriform edema/effeacement.   A/P: Mr. Moczygemba is a 45 year old male admitted last night with oral cavity, oropharynx and hypopharynx edema secondary to ACEI induced angioedema. All edema has resolved. He is asymptomatic except for a mild dysphonia which is expected to resolve in a few days without treatment. Safe for regular diet and discharge home. Would avoid additional steroids. Recommended that he sees an allergist/immunologist as an outpatient and is worked up for hereditary angioedema, as his long term symptoms appear more complicated that simply ACEI angioedema.   Dakari Cregger C 08/28/2016 10:51 AM

## 2016-08-28 NOTE — Assessment & Plan Note (Signed)
Refer to allergist/immunologist

## 2016-08-28 NOTE — Discharge Instructions (Signed)
°  DIET:  Diabetic diet, cardiac diet  DISCHARGE CONDITION:  Stable  ACTIVITY:  Activity as tolerated  OXYGEN:  Home Oxygen: No.   Oxygen Delivery: room air  DISCHARGE LOCATION:  home    ADDITIONAL DISCHARGE INSTRUCTION:dont take lisinopril  If you experience worsening of your admission symptoms, develop shortness of breath, life threatening emergency, suicidal or homicidal thoughts you must seek medical attention immediately by calling 911 or calling your MD immediately  if symptoms less severe.  You Must read complete instructions/literature along with all the possible adverse reactions/side effects for all the Medicines you take and that have been prescribed to you. Take any new Medicines after you have completely understood and accpet all the possible adverse reactions/side effects.   Please note  You were cared for by a hospitalist during your hospital stay. If you have any questions about your discharge medications or the care you received while you were in the hospital after you are discharged, you can call the unit and asked to speak with the hospitalist on call if the hospitalist that took care of you is not available. Once you are discharged, your primary care physician will handle any further medical issues. Please note that NO REFILLS for any discharge medications will be authorized once you are discharged, as it is imperative that you return to your primary care physician (or establish a relationship with a primary care physician if you do not have one) for your aftercare needs so that they can reassess your need for medications and monitor your lab values.

## 2016-08-29 LAB — GLUCOSE, CAPILLARY: GLUCOSE-CAPILLARY: 179 mg/dL — AB (ref 65–99)

## 2016-09-06 ENCOUNTER — Ambulatory Visit: Payer: Managed Care, Other (non HMO) | Admitting: Cardiovascular Disease

## 2016-09-12 ENCOUNTER — Encounter: Payer: Managed Care, Other (non HMO) | Attending: Family Medicine | Admitting: Dietician

## 2016-09-12 ENCOUNTER — Encounter: Payer: Self-pay | Admitting: Dietician

## 2016-09-12 ENCOUNTER — Encounter: Payer: Self-pay | Admitting: Family Medicine

## 2016-09-12 VITALS — BP 124/90 | Ht 72.0 in | Wt 221.9 lb

## 2016-09-12 DIAGNOSIS — Z713 Dietary counseling and surveillance: Secondary | ICD-10-CM | POA: Diagnosis not present

## 2016-09-12 DIAGNOSIS — Z6830 Body mass index (BMI) 30.0-30.9, adult: Secondary | ICD-10-CM | POA: Diagnosis not present

## 2016-09-12 DIAGNOSIS — E119 Type 2 diabetes mellitus without complications: Secondary | ICD-10-CM | POA: Diagnosis not present

## 2016-09-12 NOTE — Progress Notes (Signed)

## 2016-09-13 ENCOUNTER — Telehealth: Payer: Self-pay

## 2016-09-13 NOTE — Telephone Encounter (Signed)
Pt calling checking on status of referral.

## 2016-09-26 ENCOUNTER — Encounter: Payer: Managed Care, Other (non HMO) | Attending: Family Medicine | Admitting: Dietician

## 2016-09-26 ENCOUNTER — Encounter: Payer: Self-pay | Admitting: Family Medicine

## 2016-09-26 ENCOUNTER — Encounter: Payer: Self-pay | Admitting: Dietician

## 2016-09-26 VITALS — Wt 223.1 lb

## 2016-09-26 DIAGNOSIS — E119 Type 2 diabetes mellitus without complications: Secondary | ICD-10-CM | POA: Diagnosis not present

## 2016-09-26 DIAGNOSIS — Z6830 Body mass index (BMI) 30.0-30.9, adult: Secondary | ICD-10-CM | POA: Insufficient documentation

## 2016-09-26 DIAGNOSIS — Z713 Dietary counseling and surveillance: Secondary | ICD-10-CM | POA: Insufficient documentation

## 2016-09-26 NOTE — Progress Notes (Signed)
Appt. Start Time: 1730 Appt. End Time: 2030  Class 2 Diabetes Overview - define DM; state own type of DM; identify functions of pancreas and insulin; define insulin deficiency vs insulin resistance  Psychosocial - identify DM as a source of stress; state the effects of stress on BG control; verbalize appropriate stress management techniques; identify personal stress issues   Nutritional Management - describe effects of food on blood glucose; identify sources of carbohydrate, protein and fat; verbalize the importance of balance meals in controlling blood glucose; identify meals as well balanced or not; estimate servings of carbohydrate from menus; use food labels to identify servings size, content of carbohydrate, fiber, protein, fat, saturated fat and sodium; recognize food sources of fat, saturated fat, trans fat, sodium and verbalize goals for intake; describe healthful appropriate food choices when dining out   Exercise - describe the effects of exercise on blood glucose and importance of regular exercise in controlling diabetes; state a plan for personal exercise; verbalize contraindications for exercise  Medications - state name, dose, timing of currently prescribed medications; describe types of medications available for diabetes  Self-Monitoring - state importance of HBGM and demo procedure accurately; use HBGM results to effectively manage diabetes; identify importance of regular HbA1C testing and goals for results  Acute Complications/Sick Day Guidelines - recognize hyperglycemia and hypoglycemia with causes and effects; identify blood glucose results as high, low or in control; list steps in treating and preventing high and low blood glucose; state appropriate measure to manage blood glucose when ill (need for meds, HBGM plan, when to call physician, need for fluids)  Chronic Complications/Foot, Skin, Eye Dental Care - identify possible long-term complications of diabetes (retinopathy,  neuropathy, nephropathy, cardiovascular disease, infections); explain steps in prevention and treatment of chronic complications; state importance of daily self-foot exams; describe how to examine feet and what to look for; explain appropriate eye and dental care  Lifestyle Changes/Goals & Health/Community Resources - state benefits of making appropriate lifestyle changes; identify habits that need to change (meals, tobacco, alcohol); identify strategies to reduce risk factors for personal health; set goals for proper diabetes care; state need for and frequency of healthcare follow-up; describe appropriate community resources for good health (ADA, web sites, apps)   Pregnancy/Sexual Health - define gestational diabetes; state importance of good blood glucose control and birth control prior to pregnancy; state importance of good blood glucose control in preventing sexual problems (impotence, vaginal dryness, infections, loss of desire); state relationship of blood glucose control and pregnancy outcome; describe risk of maternal and fetal complications  Teaching Materials Used: Class 2 Slide Packet A1C Pamphlet Foot Care Literature Menu Ideas Goals for Class 2  

## 2016-09-27 ENCOUNTER — Other Ambulatory Visit: Payer: Self-pay | Admitting: Family Medicine

## 2016-09-27 ENCOUNTER — Encounter: Payer: Self-pay | Admitting: Dietician

## 2016-09-27 NOTE — Telephone Encounter (Signed)
Please follow-up with patient, see his MyChart note; I put referral to allergist in on Sept 3rd; please do whatever is needed to facilitate this quickly; thank you

## 2016-09-27 NOTE — Progress Notes (Signed)
Discharge letter sent to Dr. Sanda Klein

## 2016-10-24 ENCOUNTER — Other Ambulatory Visit: Payer: Self-pay | Admitting: Family Medicine

## 2016-10-24 NOTE — Telephone Encounter (Signed)
Patient notified

## 2016-10-24 NOTE — Telephone Encounter (Signed)
Please ask patient to have the fasting lipids and the liver enzyme checked this week; thank you I'll refill so he doesn't run out

## 2016-10-25 ENCOUNTER — Other Ambulatory Visit: Payer: Self-pay | Admitting: Family Medicine

## 2016-10-26 ENCOUNTER — Encounter: Payer: Self-pay | Admitting: Physical Therapy

## 2016-10-26 ENCOUNTER — Ambulatory Visit: Payer: Managed Care, Other (non HMO) | Attending: *Deleted | Admitting: Physical Therapy

## 2016-10-26 DIAGNOSIS — M6281 Muscle weakness (generalized): Secondary | ICD-10-CM | POA: Diagnosis present

## 2016-10-26 DIAGNOSIS — G8929 Other chronic pain: Secondary | ICD-10-CM | POA: Insufficient documentation

## 2016-10-26 DIAGNOSIS — M2560 Stiffness of unspecified joint, not elsewhere classified: Secondary | ICD-10-CM

## 2016-10-26 DIAGNOSIS — M5441 Lumbago with sciatica, right side: Secondary | ICD-10-CM | POA: Insufficient documentation

## 2016-10-26 DIAGNOSIS — M5442 Lumbago with sciatica, left side: Secondary | ICD-10-CM | POA: Diagnosis not present

## 2016-10-27 ENCOUNTER — Other Ambulatory Visit: Payer: Self-pay | Admitting: Family Medicine

## 2016-10-27 NOTE — Therapy (Addendum)
[facility] [address]. [place], [place], [address] Phone: [phone]   Fax:  [phone]  Physical Therapy Evaluation  Patient Details  Name: John Hartman MRN: [id number] Date of Birth: [birth year] Referring Provider: Dr. Erasmo Leventhal  Encounter Date: 10/26/2016    Past Medical History:  Diagnosis Date  . Angioedema   . DDD (degenerative disc disease), cervical   . Diabetes mellitus ([place])   . GERD (gastroesophageal reflux disease)   . Gout   . HTN (hypertension)   . Hypertension    CONTROLLED ON MEDS  . IBS (irritable bowel syndrome)   . Seasonal allergies   . Spinal stenosis     Past Surgical History:  Procedure Laterality Date  . ACDF with fusion  2007  . CARDIAC CATHETERIZATION Left 08/15/2016   Procedure: Left Heart Cath and Coronary Angiography;  Surgeon: Wellington Hampshire, MD;  Location: [facility];  Service: Cardiovascular;  Laterality: Left;  . COLONOSCOPY WITH PROPOFOL N/A 06/26/2015   Procedure: COLONOSCOPY WITH PROPOFOL;  Surgeon: Lucilla Lame, MD;  Location: [facility];  Service: Endoscopy;  Laterality: N/A;  with biopsies  . NASAL SEPTUM SURGERY  2004  . SHOULDER SURGERY Left 2007  . Testicular torsion  1980s  . VASECTOMY      There were no vitals filed for this visit.    Pt. reports chronic h/o back pain since March 2017.  Pt. states he was exercising and had a stinging/ spasm in low back.  Pt. states X-ray revealed arthritis in low back/ thinning discs.  Pt. reports persistent pain and is hoping PT will help pt. delay the need for back surgery.     See HEP    Pt. is a pleasant 45 y/o male with chronic, progressive worsening of low back pain since 3/17.  Pt. states he has degenerative changes in lumbar spine/ joint spacing.  Pt. presents with full lumbar AROM in standing posture with increase pain with lumbar flexion/ eccentric return.  Pt. prefers lumbar extension to relieve symptoms but limited  with prone press-ups.  B LE AROM WNL and strength grossly 5/5 except L knee ext. 4/5 (discomfort in knee reported).  Hamstring length grossly 75 deg. bilateral.  MODI: 36% self-perceived moderate disability.  Moderate lumbar hypomobility noted with generalized tendernes/ pain reported.  Pt. c/o LBP with radicular symptoms in L buttocks but will occasional have R sided radicular symptoms.  Pt. will benefit from skilled PT services to increase pain-free lumbar AROM/ stability to improve return to gym based ex./ outdoor activities.          PT Long Term Goals - 10/27/16 1309      PT LONG TERM GOAL #1   Title Pt. I with HEP to develop core stability/ progressive HEP to improve pain-free mobility and return to gym based ex.    Baseline Pt. currently unable to exercise due to back pain.     Time 4   Period Weeks   Status New     PT LONG TERM GOAL #2   Title Pt. will decrease MODI to <20% to improve pain-free lumbar mobility with daily functional tasks.     Baseline 10/26/16 MODI 36% self-perceived moderate disability.     Time 4   Period Weeks   Status New     PT LONG TERM GOAL #3   Title Pt. will report no radicular symptoms into buttocks with work-related task/ exercises.     Baseline Primarily c/o L  LE radicular symptoms but will occasional have pain on R side.     Time 4   Period Weeks   Status New     PT LONG TERM GOAL #4   Title Pt. will return to 30 minutes of gym based ex. with no increase c/o low back pain.     Baseline currently not attending gym due to back pain.   Time 4   Period Weeks   Status New         Patient will benefit from skilled therapeutic intervention in order to improve the following deficits and impairments:  Pain, Improper body mechanics, Postural dysfunction, Decreased activity tolerance, Decreased endurance, Decreased range of motion, Decreased strength, Hypomobility, Difficulty walking  Visit Diagnosis: Chronic bilateral low back pain with bilateral  sciatica  Muscle weakness (generalized)  Joint stiffness     Problem List Patient Active Problem List   Diagnosis Date Noted  . Angioedema   . Encounter for medication monitoring 08/25/2016  . Coronary artery disease 08/25/2016  . Unstable angina ([place])   . Diabetes mellitus ([place])   . Chest pain 08/08/2016  . Preventative health care 08/02/2016  . Ketonuria 07/12/2016  . Glucosuria 07/12/2016  . New onset type 1 diabetes mellitus, uncontrolled ([place]) 07/08/2016  . 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 Palpitations 08/12/2015  . Family history of malignant neoplasm of gastrointestinal tract   . Benign neoplasm of rectosigmoid junction   . Essential hypertension 05/28/2015  . Gout 05/28/2015  . IBS (irritable bowel syndrome) 05/28/2015   Pura Spice, PT, DPT # [phone]  10/27/2016, 11:13 AM  [facility] [address] [place], [place], [address] Phone: [phone]   Fax:  [phone]  Name: John Hartman MRN: [id number] Date of Birth: 10/26/1971

## 2016-10-28 LAB — LIPID PANEL
CHOL/HDL RATIO: 5.1 ratio — AB (ref <=5.0)
Cholesterol: 152 mg/dL (ref 125–200)
HDL: 30 mg/dL — AB (ref >=40)
LDL CALC: 105 mg/dL (ref <130)
TRIGLYCERIDES: 85 mg/dL (ref <150)
VLDL: 17 mg/dL (ref <30)

## 2016-10-28 LAB — ALT: ALT: 39 U/L (ref 9–46)

## 2016-10-31 ENCOUNTER — Encounter: Payer: Self-pay | Admitting: Family Medicine

## 2016-10-31 DIAGNOSIS — Z5181 Encounter for therapeutic drug level monitoring: Secondary | ICD-10-CM

## 2016-10-31 DIAGNOSIS — I2510 Atherosclerotic heart disease of native coronary artery without angina pectoris: Secondary | ICD-10-CM

## 2016-11-01 ENCOUNTER — Ambulatory Visit: Payer: Managed Care, Other (non HMO) | Admitting: Physical Therapy

## 2016-11-01 DIAGNOSIS — G8929 Other chronic pain: Secondary | ICD-10-CM

## 2016-11-01 DIAGNOSIS — M6281 Muscle weakness (generalized): Secondary | ICD-10-CM

## 2016-11-01 DIAGNOSIS — M5442 Lumbago with sciatica, left side: Principal | ICD-10-CM

## 2016-11-01 DIAGNOSIS — M2560 Stiffness of unspecified joint, not elsewhere classified: Secondary | ICD-10-CM

## 2016-11-01 DIAGNOSIS — M5441 Lumbago with sciatica, right side: Principal | ICD-10-CM

## 2016-11-02 NOTE — Therapy (Addendum)
[facility] [address] [place], [place], [address] Phone: [phone]   Fax:  [phone]  Physical Therapy Treatment  Patient Details  Name: John Hartman MRN: [id number] Date of Birth: [birth year] Referring Provider: Dr. Erasmo Leventhal  Encounter Date: 11/01/2016  2 of 8 treatments.    Past Medical History:  Diagnosis Date  . Angioedema   . DDD (degenerative disc disease), cervical   . Diabetes mellitus ([place])   . GERD (gastroesophageal reflux disease)   . Gout   . HTN (hypertension)   . Hypertension    CONTROLLED ON MEDS  . IBS (irritable bowel syndrome)   . Seasonal allergies   . Spinal stenosis     Past Surgical History:  Procedure Laterality Date  . ACDF with fusion  2007  . CARDIAC CATHETERIZATION Left 08/15/2016   Procedure: Left Heart Cath and Coronary Angiography;  Surgeon: Wellington Hampshire, MD;  Location: [place] [facility];  Service: Cardiovascular;  Laterality: Left;  . COLONOSCOPY WITH PROPOFOL N/A 06/26/2015   Procedure: COLONOSCOPY WITH PROPOFOL;  Surgeon: Lucilla Lame, MD;  Location: [place];  Service: Endoscopy;  Laterality: N/A;  with biopsies  . NASAL SEPTUM SURGERY  2004  . SHOULDER SURGERY Left 2007  . Testicular torsion  1980s  . VASECTOMY      There were no vitals filed for this visit.    Pt. states he had difficulty with prone press-ups/ extension while completing HEP.  Pt. instructed to avoid pain provoking movement patterns.       OBJECTIVE:  There.ex.:  Reviewed HEP (modified prone ex./ press-ups secondary to pain provoking).  Pt. Instructed to lessen TrA muscle contraction to avoid rectus activation during core progressive ex.  TrA page #1-2.  Standing lumbar AROM as tolerated.  Manual tx.: supine LE/lumbar gentle stretches with static holds.  No prone mobs. Today.        Pt response for medical necessity:  Pt. Benefits from PT instruction in proper HEP and core strengthening to improve  pain-free mobility.       PT focused on ther.ex./ modifying HEP today due to recent low back pain flare-up.  No mobilizations today due to pain.  Increase in hamstring/ pirifromis flexibility after manual tx.  Limited prone/ standing lumbar extension noted during tx.            PT Long Term Goals - 10/27/16 1309      PT LONG TERM GOAL #1   Title Pt. I with HEP to develop core stability/ progressive HEP to improve pain-free mobility and return to gym based ex.    Baseline Pt. currently unable to exercise due to back pain.     Time 4   Period Weeks   Status New     PT LONG TERM GOAL #2   Title Pt. will decrease MODI to <20% to improve pain-free lumbar mobility with daily functional tasks.     Baseline 10/26/16 MODI 36% self-perceived moderate disability.     Time 4   Period Weeks   Status New     PT LONG TERM GOAL #3   Title Pt. will report no radicular symptoms into buttocks with work-related task/ exercises.     Baseline Primarily c/o L LE radicular symptoms but will occasional have pain on R side.     Time 4   Period Weeks   Status New     PT LONG TERM GOAL #4   Title Pt. will return to  30 minutes of gym based ex. with no increase c/o low back pain.     Baseline currently not attending gym due to back pain.   Time 4   Period Weeks   Status New             Patient will benefit from skilled therapeutic intervention in order to improve the following deficits and impairments:  Pain, Improper body mechanics, Postural dysfunction, Decreased activity tolerance, Decreased endurance, Decreased range of motion, Decreased strength, Hypomobility, Difficulty walking  Visit Diagnosis: Chronic bilateral low back pain with bilateral sciatica  Muscle weakness (generalized)  Joint stiffness     Problem List Patient Active Problem List   Diagnosis Date Noted  . Angioedema   . Encounter for medication monitoring 08/25/2016  . Coronary artery disease 08/25/2016  .  Unstable angina ([place])   . Diabetes mellitus ([place])   . Chest pain 08/08/2016  . Preventative health care 08/02/2016  . Ketonuria 07/12/2016  . Glucosuria 07/12/2016  . New onset type 1 diabetes mellitus, uncontrolled ([place]) 07/08/2016  . 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 Palpitations 08/12/2015  . Family history of malignant neoplasm of gastrointestinal tract   . Benign neoplasm of rectosigmoid junction   . Essential hypertension 05/28/2015  . Gout 05/28/2015  . IBS (irritable bowel syndrome) 05/28/2015   Pura Spice, PT, DPT # [phone] 11/08/2016, 1:32 PM  [facility] [address] [place], [place], [address] Phone: [phone]   Fax:  [phone]  Name: John Hartman MRN: [id number] Date of Birth: June 16, 1971

## 2016-11-03 ENCOUNTER — Ambulatory Visit: Payer: Managed Care, Other (non HMO) | Admitting: Physical Therapy

## 2016-11-03 DIAGNOSIS — M5441 Lumbago with sciatica, right side: Principal | ICD-10-CM

## 2016-11-03 DIAGNOSIS — M2560 Stiffness of unspecified joint, not elsewhere classified: Secondary | ICD-10-CM

## 2016-11-03 DIAGNOSIS — G8929 Other chronic pain: Secondary | ICD-10-CM

## 2016-11-03 DIAGNOSIS — M5442 Lumbago with sciatica, left side: Principal | ICD-10-CM

## 2016-11-03 DIAGNOSIS — M6281 Muscle weakness (generalized): Secondary | ICD-10-CM

## 2016-11-04 NOTE — Therapy (Addendum)
[facility] [address] [place], [place], [address] Phone: [phone]   Fax:  [phone]  Physical Therapy Treatment  Patient Details  Name: John Hartman MRN: [id number] Date of Birth: 1971/09/01 Referring Provider: Dr. Erasmo Leventhal  Encounter Date: 11/03/2016  3 of 8 treatments  Past Medical History:  Diagnosis Date  . Angioedema   . DDD (degenerative disc disease), cervical   . Diabetes mellitus ([place])   . GERD (gastroesophageal reflux disease)   . Gout   . HTN (hypertension)   . Hypertension    CONTROLLED ON MEDS  . IBS (irritable bowel syndrome)   . Seasonal allergies   . Spinal stenosis     Past Surgical History:  Procedure Laterality Date  . ACDF with fusion  2007  . CARDIAC CATHETERIZATION Left 08/15/2016   Procedure: Left Heart Cath and Coronary Angiography;  Surgeon: Wellington Hampshire, MD;  Location: [place] [facility];  Service: Cardiovascular;  Laterality: Left;  . COLONOSCOPY WITH PROPOFOL N/A 06/26/2015   Procedure: COLONOSCOPY WITH PROPOFOL;  Surgeon: Lucilla Lame, MD;  Location: [place];  Service: Endoscopy;  Laterality: N/A;  with biopsies  . NASAL SEPTUM SURGERY  2004  . SHOULDER SURGERY Left 2007  . Testicular torsion  1980s  . VASECTOMY      There were no vitals filed for this visit.    Pt. states he did well yesterday with less back pain but c/o increase R LBP this AM (no reason why).  Pt. reports 3/10 back pain currently.     OBJECTIVE:  Discussed HEP (no questions).  Manual tx.:  Supine LE/lumbar generalized stretches (hamstring/ piriformis/ lumbar rotn.) 3x each.  Supine L/R neural glides with gentle oscillations 5x each.  Prone grade II-III PA mobs. To Mid-thoracic/lumbar region 2x15 sec. Each (unilateral/central)- good tolerance/ no increase c/o pain.  Decrease low back/ R buttocks pain LAD in prone position.  Prone quad/ hip flexor stretches [facility]).  MH to lumbar region in sitting after tx.      Pt response for medical necessity: benefits from stretches/ neural glides to increase pain-free mobility.  Increase lumbar mobility noted as compared to initial evaluation.  No issues with addition of R heel lift.  PT recommends pt. Continue use of lift to improve symmetry/ alignment.      Pt. demonstrates marked improvement in proper technique with HEP.  Increase R LE neural glides as compared to L with no increase c/o pain.  Pt. able to tolerate prone positioning with addition of grade II-III central/unilateral mobs.  Decrease low back/ pain symptoms with R LAD in supine position.  Pt. reports slight stiffness in low back after tx. but after addition of MH to lumbar region in sitting pt. reports "feeling better".          PT Long Term Goals - 10/27/16 1309      PT LONG TERM GOAL #1   Title Pt. I with HEP to develop core stability/ progressive HEP to improve pain-free mobility and return to gym based ex.    Baseline Pt. currently unable to exercise due to back pain.     Time 4   Period Weeks   Status New     PT LONG TERM GOAL #2   Title Pt. will decrease MODI to <20% to improve pain-free lumbar mobility with daily functional tasks.     Baseline 10/26/16 MODI 36% self-perceived moderate disability.     Time 4   Period Weeks  Status New     PT LONG TERM GOAL #3   Title Pt. will report no radicular symptoms into buttocks with work-related task/ exercises.     Baseline Primarily c/o L LE radicular symptoms but will occasional have pain on R side.     Time 4   Period Weeks   Status New     PT LONG TERM GOAL #4   Title Pt. will return to 30 minutes of gym based ex. with no increase c/o low back pain.     Baseline currently not attending gym due to back pain.   Time 4   Period Weeks   Status New             Patient will benefit from skilled therapeutic intervention in order to improve the following deficits and impairments:  Pain, Improper body mechanics, Postural  dysfunction, Decreased activity tolerance, Decreased endurance, Decreased range of motion, Decreased strength, Hypomobility, Difficulty walking  Visit Diagnosis: Chronic bilateral low back pain with bilateral sciatica  Muscle weakness (generalized)  Joint stiffness     Problem List Patient Active Problem List   Diagnosis Date Noted  . Angioedema   . Encounter for medication monitoring 08/25/2016  . Coronary artery disease 08/25/2016  . Unstable angina ([place])   . Diabetes mellitus ([place])   . Chest pain 08/08/2016  . Preventative health care 08/02/2016  . Ketonuria 07/12/2016  . Glucosuria 07/12/2016  . New onset type 1 diabetes mellitus, uncontrolled ([place]) 07/08/2016  . 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 Palpitations 08/12/2015  . Family history of malignant neoplasm of gastrointestinal tract   . Benign neoplasm of rectosigmoid junction   . Essential hypertension 05/28/2015  . Gout 05/28/2015  . IBS (irritable bowel syndrome) 05/28/2015   Pura Spice, PT, DPT # [phone]  11/08/2016, 4:33 PM  [facility] [address] [place], [place], [address] Phone: [phone]   Fax:  [phone]  Name: John Hartman MRN: [id number] Date of Birth: 1971-07-30

## 2016-11-07 MED ORDER — ATORVASTATIN CALCIUM 40 MG PO TABS: 40.0000 mg | ORAL_TABLET | Freq: Every day | ORAL | 1 refills | Status: DC

## 2016-11-07 NOTE — Assessment & Plan Note (Signed)
Increase statin, recheck lipids in 6 weeks 

## 2016-11-07 NOTE — Assessment & Plan Note (Signed)
Check sgpt in 6 weeks 

## 2016-11-08 ENCOUNTER — Ambulatory Visit: Payer: Managed Care, Other (non HMO) | Admitting: Physical Therapy

## 2016-11-08 ENCOUNTER — Encounter: Payer: Self-pay | Admitting: Physical Therapy

## 2016-11-08 DIAGNOSIS — G8929 Other chronic pain: Secondary | ICD-10-CM

## 2016-11-08 DIAGNOSIS — M5441 Lumbago with sciatica, right side: Principal | ICD-10-CM

## 2016-11-08 DIAGNOSIS — M5442 Lumbago with sciatica, left side: Secondary | ICD-10-CM | POA: Diagnosis not present

## 2016-11-08 DIAGNOSIS — M2560 Stiffness of unspecified joint, not elsewhere classified: Secondary | ICD-10-CM

## 2016-11-08 DIAGNOSIS — M6281 Muscle weakness (generalized): Secondary | ICD-10-CM

## 2016-11-08 NOTE — Therapy (Signed)
[facility] [address] [place], [place], [address] Phone: [phone]   Fax:  [phone]  Physical Therapy Treatment  Patient Details  Name: John Hartman MRN: [id number] Date of Birth: [birth year] Referring Provider: Dr. Erasmo Leventhal  Encounter Date: 11/08/2016      PT End of Session - 11/08/16 1757    Visit Number 4   Number of Visits 8   Date for PT Re-Evaluation 11/23/16   PT Start Time 1600   PT Stop Time 1650   PT Time Calculation (min) 50 min   Activity Tolerance Patient tolerated treatment well;Patient limited by pain   Behavior During Therapy [facility] for tasks assessed/performed      Past Medical History:  Diagnosis Date  . Angioedema   . DDD (degenerative disc disease), cervical   . Diabetes mellitus ([place])   . GERD (gastroesophageal reflux disease)   . Gout   . HTN (hypertension)   . Hypertension    CONTROLLED ON MEDS  . IBS (irritable bowel syndrome)   . Seasonal allergies   . Spinal stenosis     Past Surgical History:  Procedure Laterality Date  . ACDF with fusion  2007  . CARDIAC CATHETERIZATION Left 08/15/2016   Procedure: Left Heart Cath and Coronary Angiography;  Surgeon: Wellington Hampshire, MD;  Location: [place] [facility];  Service: Cardiovascular;  Laterality: Left;  . COLONOSCOPY WITH PROPOFOL N/A 06/26/2015   Procedure: COLONOSCOPY WITH PROPOFOL;  Surgeon: Lucilla Lame, MD;  Location: [place];  Service: Endoscopy;  Laterality: N/A;  with biopsies  . NASAL SEPTUM SURGERY  2004  . SHOULDER SURGERY Left 2007  . Testicular torsion  1980s  . VASECTOMY      There were no vitals filed for this visit.      Subjective Assessment - 11/08/16 1756    Subjective Pt states that since his last visit he has felt overall decreased back pain. Reports that he was not able to perform his TrA activation HEP over the weekend.    Limitations Standing;Lifting;Walking;House hold activities   Diagnostic tests X-ray  (75% thinning of vertebral spacing).     Patient Stated Goals Return to gym/ Lose 20 lbs./ return to hiking and hunting.     Currently in Pain? Yes   Pain Score 2    Pain Location Back   Pain Orientation Right;Left;Lower   Pain Type Chronic pain   Pain Onset More than a month ago      Objective:  Moist heat applied on prone prior to manual tx for 8 minutes.  Manual tx: Pt in prone with pillow: CPA T11-L5 Grade II-III to tolerance (2 bouts, 30 sec ea). UPA R/L L1-L5 Grade II-III to tolerance (2 bouts, 30 sec ea). Pt demonstrates improved tolerance to passive accessory mobilization; able to perform increased grade III this session as opposed to previous session. LAD R/L LE 3 bouts, 30 sec. STM R/L paraspinals. R/L sacral mobilization Grade II-III to pt tolerance (3 bouts, 30 sec).   Therapeutic Exercise: Lumbar AROM all planes following manual tx. Pt experiences onset of muscle spasm with return to neutral spine with R/L lateral flexion.   Pt response for medical necessity: Pt with decreased incidence of pain with lumbar UPA/CPA reporting feelings of "pressure" and less pain overall. Pt continues to have onset of back pain/stiffness upon arising from prone position.       PT Long Term Goals - 10/27/16 1309      PT LONG  TERM GOAL #1   Title Pt. I with HEP to develop core stability/ progressive HEP to improve pain-free mobility and return to gym based ex.    Baseline Pt. currently unable to exercise due to back pain.     Time 4   Period Weeks   Status New     PT LONG TERM GOAL #2   Title Pt. will decrease MODI to <20% to improve pain-free lumbar mobility with daily functional tasks.     Baseline 10/26/16 MODI 36% self-perceived moderate disability.     Time 4   Period Weeks   Status New     PT LONG TERM GOAL #3   Title Pt. will report no radicular symptoms into buttocks with work-related task/ exercises.     Baseline Primarily c/o L LE radicular symptoms but will occasional have  pain on R side.     Time 4   Period Weeks   Status New     PT LONG TERM GOAL #4   Title Pt. will return to 30 minutes of gym based ex. with no increase c/o low back pain.     Baseline currently not attending gym due to back pain.   Time 4   Period Weeks   Status New            Plan - 11/08/16 1758    Clinical Impression Statement Pt with improved mobility of lumbar spine to PA glide following application of moist heat in prone; continues to experience soreness/pain sx with Grade III below L3 UPA/CPA. Pt with improved tolerance of CPA/UPA this session but continues to experience recurrence of stiffness/back pain upon standing following manual tx.   Rehab Potential Good   PT Frequency 2x / week   PT Duration 4 weeks   PT Treatment/Interventions ADLs/Self Care Home Management;Aquatic Therapy;Cryotherapy;Electrical Stimulation;Traction;Dry needling;Passive range of motion;Manual techniques;Neuromuscular re-education;Patient/family education;Therapeutic exercise;Therapeutic activities;Functional mobility training;Gait training   PT Next Visit Plan Prone manual tx. if no increase muscle pain/soreness with addition of core stability ex. program.    Consulted and Agree with Plan of Care Patient      Patient will benefit from skilled therapeutic intervention in order to improve the following deficits and impairments:  Pain, Improper body mechanics, Postural dysfunction, Decreased activity tolerance, Decreased endurance, Decreased range of motion, Decreased strength, Hypomobility, Difficulty walking  Visit Diagnosis: Chronic bilateral low back pain with bilateral sciatica  Muscle weakness (generalized)  Joint stiffness     Problem List Patient Active Problem List   Diagnosis Date Noted  . Angioedema   . Encounter for medication monitoring 08/25/2016  . Coronary artery disease 08/25/2016  . Unstable angina ([place])   . Diabetes mellitus ([place])   . Chest pain 08/08/2016  .  Preventative health care 08/02/2016  . Ketonuria 07/12/2016  . Glucosuria 07/12/2016  . New onset type 1 diabetes mellitus, uncontrolled ([place]) 07/08/2016  . 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 Palpitations 08/12/2015  . Family history of malignant neoplasm of gastrointestinal tract   . Benign neoplasm of rectosigmoid junction   . Essential hypertension 05/28/2015  . Gout 05/28/2015  . IBS (irritable bowel syndrome) 05/28/2015   Pura Spice, PT, DPT # [phone] Mickel Baas Janathan Bribiesca SPT 11/08/2016, 6:01 PM  [facility] [address] [place], [place], [address] Phone: [phone]   Fax:  [phone]  Name: John Hartman MRN: [id number] Date of Birth: 1971/08/22

## 2016-11-08 NOTE — Addendum Note (Signed)
Addended by: Dorcas Carrow C on: 11/08/2016 01:52 PM   Modules accepted: Orders

## 2016-11-08 NOTE — Addendum Note (Signed)
Addended by: Dorcas Carrow C on: 11/08/2016 03:09 PM   Modules accepted: Orders

## 2016-11-10 ENCOUNTER — Ambulatory Visit: Payer: Managed Care, Other (non HMO) | Admitting: Physical Therapy

## 2016-11-10 DIAGNOSIS — M5442 Lumbago with sciatica, left side: Principal | ICD-10-CM

## 2016-11-10 DIAGNOSIS — M5441 Lumbago with sciatica, right side: Principal | ICD-10-CM

## 2016-11-10 DIAGNOSIS — G8929 Other chronic pain: Secondary | ICD-10-CM

## 2016-11-10 DIAGNOSIS — M2560 Stiffness of unspecified joint, not elsewhere classified: Secondary | ICD-10-CM

## 2016-11-10 DIAGNOSIS — M6281 Muscle weakness (generalized): Secondary | ICD-10-CM

## 2016-11-11 NOTE — Therapy (Addendum)
[facility] [address] [place], [place], [address] Phone: [phone]   Fax:  [phone]  Physical Therapy Treatment  Patient Details  Name: John Hartman MRN: [id number] Date of Birth: [birth year] Referring Provider: Dr. Erasmo Leventhal  Encounter Date: 11/10/2016      PT End of Session - 11/11/16 1448    Visit Number 5   Number of Visits 8   Date for PT Re-Evaluation 11/23/16   PT Start Time 1605   PT Stop Time 1701   PT Time Calculation (min) 56 min   Activity Tolerance Patient tolerated treatment well;Patient limited by pain   Behavior During Therapy [facility] for tasks assessed/performed      Past Medical History:  Diagnosis Date  . Angioedema   . DDD (degenerative disc disease), cervical   . Diabetes mellitus ([place])   . GERD (gastroesophageal reflux disease)   . Gout   . HTN (hypertension)   . Hypertension    CONTROLLED ON MEDS  . IBS (irritable bowel syndrome)   . Seasonal allergies   . Spinal stenosis     Past Surgical History:  Procedure Laterality Date  . ACDF with fusion  2007  . CARDIAC CATHETERIZATION Left 08/15/2016   Procedure: Left Heart Cath and Coronary Angiography;  Surgeon: Wellington Hampshire, MD;  Location: [place] [facility];  Service: Cardiovascular;  Laterality: Left;  . COLONOSCOPY WITH PROPOFOL N/A 06/26/2015   Procedure: COLONOSCOPY WITH PROPOFOL;  Surgeon: Lucilla Lame, MD;  Location: [place];  Service: Endoscopy;  Laterality: N/A;  with biopsies  . NASAL SEPTUM SURGERY  2004  . SHOULDER SURGERY Left 2007  . Testicular torsion  1980s  . VASECTOMY      There were no vitals filed for this visit.      Subjective Assessment - 11/11/16 1447    Subjective Pt. states he didn't do as well after last tx. session as he did last week.  Pt. reports persistent stiffness in low back and is constantly trying to stretch back.     Limitations Standing;Lifting;Walking;House hold activities   Diagnostic tests  X-ray (75% thinning of vertebral spacing).     Patient Stated Goals Return to gym/ Lose 20 lbs./ return to hiking and hunting.     Currently in Pain? Yes   Pain Score 2    Pain Location Back   Pain Orientation Lower      Objective:  Moist heat applied on prone prior to manual tx for 10 minutes.  Manual tx:  Supine hamstring/ piriformis stretches (9 min.).  Supine neural glides/ LAD to L and R LE 3x15 sec. ("feels good").    Therapeutic Exercise: Lumbar AROM all planes following manual tx. Pt experiences onset of muscle spasm with return to neutral spine with R/L lateral flexion.  Quadruped ex. (cat-camel/ alt. UE and LE).  Prone plank on knee (15 to 30 sec.).  Seated ball ex. (pelvic tilts/ marching/ LAQ)- 10x2 each.  Standing rotn. With golf club (pain-free ROM).    Pt response for medical necessity: Pt. Able to progress with core stability ex. But requires cuing to return to neutral positioning.  Increase back discomfort/ stiffness with position changes from supine.    No manual tx./ mobs. performed today and focused on core stability ex.  Min. to mod. cuing to properly perform planking/ quadruped ex.  Pt. continues to benefit from use of heel lift.  Moderate muscle stiffness in hamstring/ pirifromis noted.  Pt. benefits from L/R  LE LAD to decrease pain symptoms.  No increase c/o low back pain during ther.ex. but muscle fatigue/ moderate lumbar stiffness noted.          PT Long Term Goals - 10/27/16 1309      PT LONG TERM GOAL #1   Title Pt. I with HEP to develop core stability/ progressive HEP to improve pain-free mobility and return to gym based ex.    Baseline Pt. currently unable to exercise due to back pain.     Time 4   Period Weeks   Status New     PT LONG TERM GOAL #2   Title Pt. will decrease MODI to <20% to improve pain-free lumbar mobility with daily functional tasks.     Baseline 10/26/16 MODI 36% self-perceived moderate disability.     Time 4   Period Weeks    Status New     PT LONG TERM GOAL #3   Title Pt. will report no radicular symptoms into buttocks with work-related task/ exercises.     Baseline Primarily c/o L LE radicular symptoms but will occasional have pain on R side.     Time 4   Period Weeks   Status New     PT LONG TERM GOAL #4   Title Pt. will return to 30 minutes of gym based ex. with no increase c/o low back pain.     Baseline currently not attending gym due to back pain.   Time 4   Period Weeks   Status New            Plan - 11/11/16 1450    Rehab Potential Good   PT Frequency 2x / week   PT Duration 4 weeks   PT Treatment/Interventions ADLs/Self Care Home Management;Aquatic Therapy;Cryotherapy;Electrical Stimulation;Traction;Dry needling;Passive range of motion;Manual techniques;Neuromuscular re-education;Patient/family education;Therapeutic exercise;Therapeutic activities;Functional mobility training;Gait training   PT Next Visit Plan Prone manual tx. if no increase muscle pain/soreness with addition of core stability ex. program.    Consulted and Agree with Plan of Care Patient      Patient will benefit from skilled therapeutic intervention in order to improve the following deficits and impairments:  Pain, Improper body mechanics, Postural dysfunction, Decreased activity tolerance, Decreased endurance, Decreased range of motion, Decreased strength, Hypomobility, Difficulty walking  Visit Diagnosis: Chronic bilateral low back pain with bilateral sciatica  Muscle weakness (generalized)  Joint stiffness     Problem List Patient Active Problem List   Diagnosis Date Noted  . Angioedema   . Encounter for medication monitoring 08/25/2016  . Coronary artery disease 08/25/2016  . Unstable angina ([place])   . Diabetes mellitus ([place])   . Chest pain 08/08/2016  . Preventative health care 08/02/2016  . Ketonuria 07/12/2016  . Glucosuria 07/12/2016  . New onset type 1 diabetes mellitus, uncontrolled ([place])  07/08/2016  . 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 Palpitations 08/12/2015  . Family history of malignant neoplasm of gastrointestinal tract   . Benign neoplasm of rectosigmoid junction   . Essential hypertension 05/28/2015  . Gout 05/28/2015  . IBS (irritable bowel syndrome) 05/28/2015   Pura Spice, PT, DPT # [phone] 11/11/2016, 2:51 PM  [facility] [address] [place], [place], [address] Phone: [phone]   Fax:  [phone]  Name: Kartier Ater MRN: [id number] Date of Birth: August 19, 1971

## 2016-11-15 ENCOUNTER — Encounter: Payer: Managed Care, Other (non HMO) | Admitting: Physical Therapy

## 2016-11-22 ENCOUNTER — Ambulatory Visit: Payer: Managed Care, Other (non HMO) | Admitting: Physical Therapy

## 2016-11-22 DIAGNOSIS — G8929 Other chronic pain: Secondary | ICD-10-CM

## 2016-11-22 DIAGNOSIS — M2560 Stiffness of unspecified joint, not elsewhere classified: Secondary | ICD-10-CM

## 2016-11-22 DIAGNOSIS — M6281 Muscle weakness (generalized): Secondary | ICD-10-CM

## 2016-11-22 DIAGNOSIS — M5442 Lumbago with sciatica, left side: Principal | ICD-10-CM

## 2016-11-22 DIAGNOSIS — M5441 Lumbago with sciatica, right side: Principal | ICD-10-CM

## 2016-11-23 ENCOUNTER — Encounter: Payer: Self-pay | Admitting: Physical Therapy

## 2016-11-23 NOTE — Therapy (Addendum)
[facility] [address] [place], [place], [address] Phone: [phone]   Fax:  [phone]  Physical Therapy Treatment  Patient Details  Name: John Hartman MRN: [id number] Date of Birth: February 08, 1971 Referring Provider: Dr. Erasmo Leventhal  Encounter Date: 11/22/2016      PT End of Session - 11/23/16 1742    Visit Number 6   Number of Visits 8   Date for PT Re-Evaluation 11/23/16   PT Start Time 1608   PT Stop Time 1659   PT Time Calculation (min) 51 min   Activity Tolerance Patient tolerated treatment well;Patient limited by pain   Behavior During Therapy [facility] for tasks assessed/performed      Past Medical History:  Diagnosis Date  . Angioedema   . DDD (degenerative disc disease), cervical   . Diabetes mellitus ([place])   . GERD (gastroesophageal reflux disease)   . Gout   . HTN (hypertension)   . Hypertension    CONTROLLED ON MEDS  . IBS (irritable bowel syndrome)   . Seasonal allergies   . Spinal stenosis     Past Surgical History:  Procedure Laterality Date  . ACDF with fusion  2007  . CARDIAC CATHETERIZATION Left 08/15/2016   Procedure: Left Heart Cath and Coronary Angiography;  Surgeon: Wellington Hampshire, MD;  Location: [place] [facility];  Service: Cardiovascular;  Laterality: Left;  . COLONOSCOPY WITH PROPOFOL N/A 06/26/2015   Procedure: COLONOSCOPY WITH PROPOFOL;  Surgeon: Lucilla Lame, MD;  Location: [place];  Service: Endoscopy;  Laterality: N/A;  with biopsies  . NASAL SEPTUM SURGERY  2004  . SHOULDER SURGERY Left 2007  . Testicular torsion  1980s  . VASECTOMY      There were no vitals filed for this visit.      Subjective Assessment - 11/23/16 1742    Limitations Standing;Lifting;Walking;House hold activities   Diagnostic tests X-ray (75% thinning of vertebral spacing).     Patient Stated Goals Return to gym/ Lose 20 lbs./ return to hiking and hunting.     Currently in Pain? Yes      Pt. reports  having a long day at work and is hurting right now.    Objective:  Moist heat applied on prone prior to manual tx for 10 minutes.  Manual tx:  Supine hamstring/ piriformis stretches (9 min.).  Supine neural glides/ LAD to L and R LE 3x15 sec. (gentle to more aggressive as tolerated glides).  Prone STM to lumbar paraspinals/  Deep STM to piriformis on R (5 min.).  Prone grade II-III PA mobs. To T10-L5 vertebrae (unilateral/ central).       Pt response for medical necessity: Pt. Able to progress with core stability ex. But requires cuing to return to neutral positioning.  Increase back discomfort/ stiffness with position changes from supine.     Pt. has persistent R lumbar muscular/ spinal pain with position changes from prone to sitting and manual tx.  Moderate hypomobility in lumbar spine and tightness noted with deep palpation to R piriformis/ glut musculature.  PT encouaraged pt. to return to TrA ex. program on a more consistent basis.         PT Long Term Goals - 10/27/16 1309      PT LONG TERM GOAL #1   Title Pt. I with HEP to develop core stability/ progressive HEP to improve pain-free mobility and return to gym based ex.    Baseline Pt. currently unable to exercise due to  back pain.     Time 4   Period Weeks   Status New     PT LONG TERM GOAL #2   Title Pt. will decrease MODI to <20% to improve pain-free lumbar mobility with daily functional tasks.     Baseline 10/26/16 MODI 36% self-perceived moderate disability.     Time 4   Period Weeks   Status New     PT LONG TERM GOAL #3   Title Pt. will report no radicular symptoms into buttocks with work-related task/ exercises.     Baseline Primarily c/o L LE radicular symptoms but will occasional have pain on R side.     Time 4   Period Weeks   Status New     PT LONG TERM GOAL #4   Title Pt. will return to 30 minutes of gym based ex. with no increase c/o low back pain.     Baseline currently not attending gym due to back  pain.   Time 4   Period Weeks   Status New               Plan - 11/23/16 1743    Rehab Potential Good   PT Frequency 2x / week   PT Duration 4 weeks   PT Treatment/Interventions ADLs/Self Care Home Management;Aquatic Therapy;Cryotherapy;Electrical Stimulation;Traction;Dry needling;Passive range of motion;Manual techniques;Neuromuscular re-education;Patient/family education;Therapeutic exercise;Therapeutic activities;Functional mobility training;Gait training   PT Next Visit Plan Prone manual tx. if no increase muscle pain/soreness with addition of core stability ex. program.   RECERT next tx.   Consulted and Agree with Plan of Care Patient      Patient will benefit from skilled therapeutic intervention in order to improve the following deficits and impairments:  Pain, Improper body mechanics, Postural dysfunction, Decreased activity tolerance, Decreased endurance, Decreased range of motion, Decreased strength, Hypomobility, Difficulty walking  Visit Diagnosis: Chronic bilateral low back pain with bilateral sciatica  Muscle weakness (generalized)  Joint stiffness     Problem List Patient Active Problem List   Diagnosis Date Noted  . Angioedema   . Encounter for medication monitoring 08/25/2016  . Coronary artery disease 08/25/2016  . Unstable angina ([place])   . Diabetes mellitus ([place])   . Chest pain 08/08/2016  . Preventative health care 08/02/2016  . Ketonuria 07/12/2016  . Glucosuria 07/12/2016  . New onset type 1 diabetes mellitus, uncontrolled ([place]) 07/08/2016  . 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 Palpitations 08/12/2015  . Family history of malignant neoplasm of gastrointestinal tract   . Benign neoplasm of rectosigmoid junction   . Essential hypertension 05/28/2015  . Gout 05/28/2015  . IBS (irritable bowel syndrome) 05/28/2015   Pura Spice, PT, DPT # [phone] 11/23/2016, 5:44 PM  [facility] [address] [place], [place], [address] Phone: [phone]   Fax:  [phone]  Name: Mohmmed Dharia MRN: [id number] Date of Birth: 12/02/1971

## 2016-11-24 ENCOUNTER — Encounter: Payer: Self-pay | Admitting: Physical Therapy

## 2016-11-24 ENCOUNTER — Ambulatory Visit: Payer: Managed Care, Other (non HMO) | Admitting: Physical Therapy

## 2016-11-24 DIAGNOSIS — M5441 Lumbago with sciatica, right side: Principal | ICD-10-CM

## 2016-11-24 DIAGNOSIS — M6281 Muscle weakness (generalized): Secondary | ICD-10-CM

## 2016-11-24 DIAGNOSIS — M2560 Stiffness of unspecified joint, not elsewhere classified: Secondary | ICD-10-CM

## 2016-11-24 DIAGNOSIS — M5442 Lumbago with sciatica, left side: Principal | ICD-10-CM

## 2016-11-24 DIAGNOSIS — G8929 Other chronic pain: Secondary | ICD-10-CM

## 2016-11-24 NOTE — Therapy (Addendum)
[facility] [address] [place], [place], [address] Phone: [phone]   Fax:  [phone]  Physical Therapy Treatment  Patient Details  Name: John Hartman MRN: [id number] Date of Birth: 08/03/1971 Referring Provider: Dr. Erasmo Leventhal  Encounter Date: 11/24/2016      PT End of Session - 11/24/16 1805    Visit Number 7   Number of Visits 14   Date for PT Re-Evaluation 12/22/16   PT Start Time 1602   PT Stop Time 1701   PT Time Calculation (min) 59 min   Activity Tolerance Patient tolerated treatment well;Patient limited by pain   Behavior During Therapy [facility] for tasks assessed/performed      Past Medical History:  Diagnosis Date  . Angioedema   . DDD (degenerative disc disease), cervical   . Diabetes mellitus ([place])   . GERD (gastroesophageal reflux disease)   . Gout   . HTN (hypertension)   . Hypertension    CONTROLLED ON MEDS  . IBS (irritable bowel syndrome)   . Seasonal allergies   . Spinal stenosis     Past Surgical History:  Procedure Laterality Date  . ACDF with fusion  2007  . CARDIAC CATHETERIZATION Left 08/15/2016   Procedure: Left Heart Cath and Coronary Angiography;  Surgeon: Wellington Hampshire, MD;  Location: [place] [facility];  Service: Cardiovascular;  Laterality: Left;  . COLONOSCOPY WITH PROPOFOL N/A 06/26/2015   Procedure: COLONOSCOPY WITH PROPOFOL;  Surgeon: Lucilla Lame, MD;  Location: [place];  Service: Endoscopy;  Laterality: N/A;  with biopsies  . NASAL SEPTUM SURGERY  2004  . SHOULDER SURGERY Left 2007  . Testicular torsion  1980s  . VASECTOMY      There were no vitals filed for this visit.      Subjective Assessment - 11/24/16 1805    Limitations Standing;Lifting;Walking;House hold activities   Diagnostic tests X-ray (75% thinning of vertebral spacing).     Patient Stated Goals Return to gym/ Lose 20 lbs./ return to hiking and hunting.     Currently in Pain? Yes      Pt. states he  is feeling pretty good today, better than he expected to feel after a long day at work.  Pt. reported at 3/10.  Objective:  Moist heat applied on prone prior to manual tx for 20mnutes.  Manual tx: Supine hamstring/ piriformis stretches (10 min.). Supine neural glides/ LAD to L and R LE 3x15 sec. (gentle to more aggressive as tolerated glides).  Prone STM to lumbar paraspinals/  Deep STM to piriformis on R (5 min.).  Prone grade II-III PA mobs. To T10-L5 vertebrae (unilateral/ central).  Ther.ex.: supine TrA core ex. Program (bridging/ marching/ rotn./ hip abd. With manual resistance).  Reviewed HEP and importance of progression.     Pt response for medical necessity: Pt. Able to progress with core stability ex. But requires cuing to return to neutral positioning. Increase back discomfort/ stiffness with position changes from supine.  Supine BTB hip abd./ marching/ bridging with TrA activation.      PT discussed inverson table for home use during episodes of lumbar flare-ups.  Significant mid-thoracic/lumbar hypomobility during PA mobs.  Decrease lumbar symptoms/ pain during LE LAD in supine or prone position.  See updated PT goals.  Pt. will continue to benefit from progression in core stability ex. program with focus on core stability and lumbar vertebrae mobility to promote pain-free functional mobility.  PT Long Term Goals - 11/24/16 1806      PT LONG TERM GOAL #1   Title Pt. I with HEP to develop core stability/ progressive HEP to improve pain-free mobility and return to gym based ex.    Baseline Pt. participating with ex. program/ has not returned to gym based ex.   Time 4   Period Weeks   Status Partially Met     PT LONG TERM GOAL #2   Title Pt. will decrease MODI to <20% to improve pain-free lumbar mobility with daily functional tasks.     Baseline 10/26/16 MODI 36% self-perceived moderate disability.     Time 4   Period Weeks   Status On-going     PT LONG TERM  GOAL #3   Title Pt. will report no radicular symptoms into buttocks with work-related task/ exercises.     Baseline Primarily c/o L LE radicular symptoms but will occasional have pain on R side.     Time 4   Period Weeks   Status Not Met     PT LONG TERM GOAL #4   Title Pt. will return to 30 minutes of gym based ex. with no increase c/o low back pain.     Baseline currently not attending gym due to back pain.   Time 4   Period Weeks   Status On-going               Plan - 11/24/16 1806    Clinical Impression Statement Discussed inverson table.     Rehab Potential Good   PT Frequency 2x / week   PT Duration 4 weeks   PT Treatment/Interventions ADLs/Self Care Home Management;Aquatic Therapy;Cryotherapy;Electrical Stimulation;Traction;Dry needling;Passive range of motion;Manual techniques;Neuromuscular re-education;Patient/family education;Therapeutic exercise;Therapeutic activities;Functional mobility training;Gait training   PT Next Visit Plan Prone manual tx. if no increase muscle pain/soreness with addition of core stability ex. program.    Consulted and Agree with Plan of Care Patient      Patient will benefit from skilled therapeutic intervention in order to improve the following deficits and impairments:  Pain, Improper body mechanics, Postural dysfunction, Decreased activity tolerance, Decreased endurance, Decreased range of motion, Decreased strength, Hypomobility, Difficulty walking  Visit Diagnosis: Chronic bilateral low back pain with bilateral sciatica  Muscle weakness (generalized)  Joint stiffness     Problem List Patient Active Problem List   Diagnosis Date Noted  . Angioedema   . Encounter for medication monitoring 08/25/2016  . Coronary artery disease 08/25/2016  . Unstable angina ([place])   . Diabetes mellitus ([place])   . Chest pain 08/08/2016  . Preventative health care 08/02/2016  . Ketonuria 07/12/2016  . Glucosuria 07/12/2016  . New onset type 1  diabetes mellitus, uncontrolled ([place]) 07/08/2016  . 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 Palpitations 08/12/2015  . Family history of malignant neoplasm of gastrointestinal tract   . Benign neoplasm of rectosigmoid junction   . Essential hypertension 05/28/2015  . Gout 05/28/2015  . IBS (irritable bowel syndrome) 05/28/2015   Pura Spice, PT, DPT # [phone] 11/24/2016, 6:08 PM  [facility] [address] [place], [place], [address] Phone: [phone]   Fax:  [phone]  Name: John Hartman MRN: [id number] Date of Birth: 04/30/1971

## 2016-11-29 ENCOUNTER — Ambulatory Visit: Payer: Managed Care, Other (non HMO) | Attending: *Deleted | Admitting: Physical Therapy

## 2016-11-29 DIAGNOSIS — M5441 Lumbago with sciatica, right side: Secondary | ICD-10-CM | POA: Diagnosis present

## 2016-11-29 DIAGNOSIS — G8929 Other chronic pain: Secondary | ICD-10-CM | POA: Diagnosis present

## 2016-11-29 DIAGNOSIS — M6281 Muscle weakness (generalized): Secondary | ICD-10-CM

## 2016-11-29 DIAGNOSIS — M5442 Lumbago with sciatica, left side: Secondary | ICD-10-CM | POA: Diagnosis not present

## 2016-11-29 DIAGNOSIS — M2560 Stiffness of unspecified joint, not elsewhere classified: Secondary | ICD-10-CM | POA: Insufficient documentation

## 2016-11-30 NOTE — Therapy (Addendum)
[facility] [address]. [place], [place], [address] Phone: [phone]   Fax:  [phone]  Physical Therapy Treatment  Patient Details  Name: John Hartman MRN: [id number] Date of Birth: Feb 07, 1971 Referring Provider: Dr. Erasmo Leventhal  Encounter Date: 11/29/2016      PT End of Session - 11/30/16 1554    Visit Number 8   Number of Visits 14   Date for PT Re-Evaluation 12/22/16   PT Start Time 1604   PT Stop Time 1701   PT Time Calculation (min) 57 min   Activity Tolerance Patient tolerated treatment well;Patient limited by pain   Behavior During Therapy [facility] for tasks assessed/performed      Past Medical History:  Diagnosis Date  . Angioedema   . DDD (degenerative disc disease), cervical   . Diabetes mellitus ([place])   . GERD (gastroesophageal reflux disease)   . Gout   . HTN (hypertension)   . Hypertension    CONTROLLED ON MEDS  . IBS (irritable bowel syndrome)   . Seasonal allergies   . Spinal stenosis     Past Surgical History:  Procedure Laterality Date  . ACDF with fusion  2007  . CARDIAC CATHETERIZATION Left 08/15/2016   Procedure: Left Heart Cath and Coronary Angiography;  Surgeon: Wellington Hampshire, MD;  Location: [place] [facility];  Service: Cardiovascular;  Laterality: Left;  . COLONOSCOPY WITH PROPOFOL N/A 06/26/2015   Procedure: COLONOSCOPY WITH PROPOFOL;  Surgeon: Lucilla Lame, MD;  Location: [place];  Service: Endoscopy;  Laterality: N/A;  with biopsies  . NASAL SEPTUM SURGERY  2004  . SHOULDER SURGERY Left 2007  . Testicular torsion  1980s  . VASECTOMY      There were no vitals filed for this visit.      Subjective Assessment - 11/30/16 1554    Limitations Standing;Lifting;Walking;House hold activities   Diagnostic tests X-ray (75% thinning of vertebral spacing).     Patient Stated Goals Return to gym/ Lose 20 lbs./ return to hiking and hunting.     Currently in Pain? Yes   Pain Score 2     Pain Location Back   Pain Orientation Lower   Pain Type Chronic pain      Good tolerance to grade II-III PA mobs. (T10-L5) in prone position.  STM to lumbar paraspinals/ superior glut. musculature and trigger pts. noted.  No radicular symptoms but localized c/o pain.      Objective:  Manual tx: Supine hamstring/ piriformis stretches (10 min.) with MH to lumbar region in supine. Supine neural glides/ LAD to L and R LE 3x30 sec.(as tolerated)- decrease pain symptoms. Prone STM to lumbar paraspinals/ Deep STM to piriformis/ sup. Glut. musculature on R (5 min.). Prone grade II-III PA mobs. To T10-L5 vertebrae (unilateral/ central). Ther.ex.: supine TrA core ex. Program with GTB (bridging/ marching/ rotn./ hip abd. With GTB).  Prone alt. UE/LE 10x2 with focus on core muscle control.     Pt response for medical necessity: Pt. Able to progress with core stability ex. But requires cuing to return to neutral positioning. Increase back discomfort/ stiffness with position changes from supine.  Pt. Progressing well with core stability ex. (addition of resistance) with no increase c/o pain.       Good tolerance to grade II-III PA mobs. (T10-L5) in prone position.  STM to lumbar paraspinals/ superior glut. musculature and trigger pts. noted.  No radicular symptoms but localized c/o pain.  PT Long Term Goals - 11/24/16 1806      PT LONG TERM GOAL #1   Title Pt. I with HEP to develop core stability/ progressive HEP to improve pain-free mobility and return to gym based ex.    Baseline Pt. participating with ex. program/ has not returned to gym based ex.   Time 4   Period Weeks   Status Partially Met     PT LONG TERM GOAL #2   Title Pt. will decrease MODI to <20% to improve pain-free lumbar mobility with daily functional tasks.     Baseline 10/26/16 MODI 36% self-perceived moderate disability.     Time 4   Period Weeks   Status On-going     PT LONG TERM GOAL #3   Title Pt. will  report no radicular symptoms into buttocks with work-related task/ exercises.     Baseline Primarily c/o L LE radicular symptoms but will occasional have pain on R side.     Time 4   Period Weeks   Status Not Met     PT LONG TERM GOAL #4   Title Pt. will return to 30 minutes of gym based ex. with no increase c/o low back pain.     Baseline currently not attending gym due to back pain.   Time 4   Period Weeks   Status On-going               Plan - 11/30/16 1555    Rehab Potential Good   PT Frequency 2x / week   PT Duration 4 weeks   PT Next Visit Plan Prone manual tx. if no increase muscle pain/soreness with addition of core stability ex. program.    Consulted and Agree with Plan of Care Patient      Patient will benefit from skilled therapeutic intervention in order to improve the following deficits and impairments:  Pain, Improper body mechanics, Postural dysfunction, Decreased activity tolerance, Decreased endurance, Decreased range of motion, Decreased strength, Hypomobility, Difficulty walking  Visit Diagnosis: Chronic bilateral low back pain with bilateral sciatica  Muscle weakness (generalized)  Joint stiffness     Problem List Patient Active Problem List   Diagnosis Date Noted  . Angioedema   . Encounter for medication monitoring 08/25/2016  . Coronary artery disease 08/25/2016  . Unstable angina ([place])   . Diabetes mellitus ([place])   . Chest pain 08/08/2016  . Preventative health care 08/02/2016  . Ketonuria 07/12/2016  . Glucosuria 07/12/2016  . New onset type 1 diabetes mellitus, uncontrolled ([place]) 07/08/2016  . 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 Palpitations 08/12/2015  . Family history of malignant neoplasm of gastrointestinal tract   . Benign neoplasm of rectosigmoid junction   . Essential hypertension 05/28/2015  . Gout 05/28/2015  . IBS (irritable bowel syndrome) 05/28/2015   Pura Spice, PT, DPT # [phone] 11/30/2016, 3:55 PM  [facility] [address] [place], [place], [address] Phone: [phone]   Fax:  [phone]  Name: John Hartman MRN: [id number] Date of Birth: 1971-12-04

## 2016-12-01 ENCOUNTER — Ambulatory Visit: Payer: Managed Care, Other (non HMO) | Admitting: Physical Therapy

## 2016-12-01 DIAGNOSIS — M2560 Stiffness of unspecified joint, not elsewhere classified: Secondary | ICD-10-CM

## 2016-12-01 DIAGNOSIS — M6281 Muscle weakness (generalized): Secondary | ICD-10-CM

## 2016-12-01 DIAGNOSIS — M5442 Lumbago with sciatica, left side: Principal | ICD-10-CM

## 2016-12-01 DIAGNOSIS — M5441 Lumbago with sciatica, right side: Principal | ICD-10-CM

## 2016-12-01 DIAGNOSIS — G8929 Other chronic pain: Secondary | ICD-10-CM

## 2016-12-02 ENCOUNTER — Encounter: Payer: Self-pay | Admitting: Physical Therapy

## 2016-12-02 NOTE — Therapy (Addendum)
[facility], [facility] [address] [place], [place], [address] Phone: [phone]   Fax:  [phone]  Physical Therapy Treatment  Patient Details  Name: John Hartman MRN: [id number] Date of Birth: 19-Sep-1971 Referring Provider: Dr. Erasmo Leventhal  Encounter Date: 12/01/2016      PT End of Session - 12/02/16 1606    Visit Number 9   Number of Visits 14   Date for PT Re-Evaluation 12/22/16   PT Start Time 1606   PT Stop Time 1655   PT Time Calculation (min) 49 min   Activity Tolerance Patient tolerated treatment well;Patient limited by pain   Behavior During Therapy [facility] for tasks assessed/performed      Past Medical History:  Diagnosis Date  . Angioedema   . DDD (degenerative disc disease), cervical   . Diabetes mellitus ([place])   . GERD (gastroesophageal reflux disease)   . Gout   . HTN (hypertension)   . Hypertension    CONTROLLED ON MEDS  . IBS (irritable bowel syndrome)   . Seasonal allergies   . Spinal stenosis     Past Surgical History:  Procedure Laterality Date  . ACDF with fusion  2007  . CARDIAC CATHETERIZATION Left 08/15/2016   Procedure: Left Heart Cath and Coronary Angiography;  Surgeon: Wellington Hampshire, MD;  Location: [place] [facility];  Service: Cardiovascular;  Laterality: Left;  . COLONOSCOPY WITH PROPOFOL N/A 06/26/2015   Procedure: COLONOSCOPY WITH PROPOFOL;  Surgeon: Lucilla Lame, MD;  Location: [place];  Service: Endoscopy;  Laterality: N/A;  with biopsies  . NASAL SEPTUM SURGERY  2004  . SHOULDER SURGERY Left 2007  . Testicular torsion  1980s  . VASECTOMY      There were no vitals filed for this visit.      Subjective Assessment - 12/02/16 1601    Limitations Standing;Lifting;Walking;House hold activities   Diagnostic tests X-ray (75% thinning of vertebral spacing).     Patient Stated Goals Return to gym/ Lose 20 lbs./ return to hiking and hunting.     Currently in Pain? Yes      Pt. continues  to have good/bad days.  Pt. states he has been working hard over past week and sitting more than normal.      Objective:  Manual tx: Supine hamstring/ piriformis stretches (10mn.) with MH to lumbar region in supine. Supine neural glides/ LAD to L and R LE 3x30 sec.(as tolerated)- decrease pain symptoms. Prone STM to lumbar paraspinals/ Deep STM to piriformis/ sup. Glut. musculature on R (5 min.). Prone grade II-III PA mobs. To T10-L5 vertebrae (unilateral/ central). Ther.ex.: See new HEP.  Cuing to maintain core stability with position changes/ clamshells.      Pt response for medical necessity: Pt. Able to progress with core stability ex. But requires cuing to return to neutral positioning. Increase back discomfort/ stiffness with position changes from supine. Pt. Progressing well with core stability ex. (addition of resistance) with no increase c/o pain.     Good technique with rotational ex. (open book) but limited by joint stiffness/ muscle tightness.  Pt. instructed to progress with stretching ex. in pain tolerable range.  Limited lumbar extension in standing but less pain reported.  Moderate cuing to maintain core stability with ex. program.          PT Education - 12/02/16 1606    Education provided Yes   Education Details See HEP   Person(s) Educated Patient   Methods Explanation;Demonstration;Handout  Comprehension Verbalized understanding;Returned demonstration             PT Long Term Goals - 11/24/16 1806      PT LONG TERM GOAL #1   Title Pt. I with HEP to develop core stability/ progressive HEP to improve pain-free mobility and return to gym based ex.    Baseline Pt. participating with ex. program/ has not returned to gym based ex.   Time 4   Period Weeks   Status Partially Met     PT LONG TERM GOAL #2   Title Pt. will decrease MODI to <20% to improve pain-free lumbar mobility with daily functional tasks.     Baseline 10/26/16 MODI 36%  self-perceived moderate disability.     Time 4   Period Weeks   Status On-going     PT LONG TERM GOAL #3   Title Pt. will report no radicular symptoms into buttocks with work-related task/ exercises.     Baseline Primarily c/o L LE radicular symptoms but will occasional have pain on R side.     Time 4   Period Weeks   Status Not Met     PT LONG TERM GOAL #4   Title Pt. will return to 30 minutes of gym based ex. with no increase c/o low back pain.     Baseline currently not attending gym due to back pain.   Time 4   Period Weeks   Status On-going               Plan - 12/02/16 1607    Rehab Potential Good   PT Frequency 2x / week   PT Duration 4 weeks   PT Treatment/Interventions ADLs/Self Care Home Management;Aquatic Therapy;Cryotherapy;Electrical Stimulation;Traction;Dry needling;Passive range of motion;Manual techniques;Neuromuscular re-education;Patient/family education;Therapeutic exercise;Therapeutic activities;Functional mobility training;Gait training   PT Next Visit Plan Prone manual tx. if no increase muscle pain/soreness with addition of core stability ex. program.    Consulted and Agree with Plan of Care Patient      Patient will benefit from skilled therapeutic intervention in order to improve the following deficits and impairments:  Pain, Improper body mechanics, Postural dysfunction, Decreased activity tolerance, Decreased endurance, Decreased range of motion, Decreased strength, Hypomobility, Difficulty walking  Visit Diagnosis: Chronic bilateral low back pain with bilateral sciatica  Muscle weakness (generalized)  Joint stiffness     Problem List Patient Active Problem List   Diagnosis Date Noted  . Angioedema   . Encounter for medication monitoring 08/25/2016  . Coronary artery disease 08/25/2016  . Unstable angina ([place])   . Diabetes mellitus ([place])   . Chest pain 08/08/2016  . Preventative health care 08/02/2016  . Ketonuria 07/12/2016  .  Glucosuria 07/12/2016  . New onset type 1 diabetes mellitus, uncontrolled ([place]) 07/08/2016  . 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 Palpitations 08/12/2015  . Family history of malignant neoplasm of gastrointestinal tract   . Benign neoplasm of rectosigmoid junction   . Essential hypertension 05/28/2015  . Gout 05/28/2015  . IBS (irritable bowel syndrome) 05/28/2015   Pura Spice, PT, DPT # [phone] 12/02/2016, 4:10 PM  [facility] [address] [place], [place], [address] Phone: [phone]   Fax:  [phone]  Name: John Hartman MRN: [id number] Date of Birth: 1971-04-21

## 2016-12-06 ENCOUNTER — Ambulatory Visit: Payer: Managed Care, Other (non HMO) | Admitting: Physical Therapy

## 2016-12-06 ENCOUNTER — Other Ambulatory Visit: Payer: Self-pay | Admitting: Family Medicine

## 2016-12-06 ENCOUNTER — Encounter: Payer: Managed Care, Other (non HMO) | Admitting: Physical Therapy

## 2016-12-06 DIAGNOSIS — M5442 Lumbago with sciatica, left side: Principal | ICD-10-CM

## 2016-12-06 DIAGNOSIS — M2560 Stiffness of unspecified joint, not elsewhere classified: Secondary | ICD-10-CM

## 2016-12-06 DIAGNOSIS — M5441 Lumbago with sciatica, right side: Principal | ICD-10-CM

## 2016-12-06 DIAGNOSIS — M6281 Muscle weakness (generalized): Secondary | ICD-10-CM

## 2016-12-06 DIAGNOSIS — G8929 Other chronic pain: Secondary | ICD-10-CM

## 2016-12-06 NOTE — Telephone Encounter (Signed)
Please ask patient if he will get diabetes medicines and supplies from the endocrinologist, in case things change (testing frequency, doses, etc.) Thank you

## 2016-12-07 NOTE — Telephone Encounter (Signed)
Called the patient and left him a detail voicemail regarding getting his diabetic supply and medicines from his endocrinologist.

## 2016-12-07 NOTE — Therapy (Addendum)
[facility] [address] [place], [place], [address] Phone: [phone]   Fax:  [phone]  Physical Therapy Treatment  Patient Details  Name: John Hartman MRN: [id number] Date of Birth: 1971/04/06 Referring Provider: Dr. Erasmo Leventhal  Encounter Date: 12/06/2016      PT End of Session - 12/07/16 1552    Visit Number 10   Number of Visits 14   Date for PT Re-Evaluation 12/22/16   PT Start Time 1652   PT Stop Time 1745   PT Time Calculation (min) 53 min   Activity Tolerance Patient tolerated treatment well;Patient limited by pain   Behavior During Therapy [facility] for tasks assessed/performed      Past Medical History:  Diagnosis Date  . Angioedema   . DDD (degenerative disc disease), cervical   . Diabetes mellitus ([place])   . GERD (gastroesophageal reflux disease)   . Gout   . HTN (hypertension)   . Hypertension    CONTROLLED ON MEDS  . IBS (irritable bowel syndrome)   . Seasonal allergies   . Spinal stenosis     Past Surgical History:  Procedure Laterality Date  . ACDF with fusion  2007  . CARDIAC CATHETERIZATION Left 08/15/2016   Procedure: Left Heart Cath and Coronary Angiography;  Surgeon: Wellington Hampshire, MD;  Location: [place] [facility];  Service: Cardiovascular;  Laterality: Left;  . COLONOSCOPY WITH PROPOFOL N/A 06/26/2015   Procedure: COLONOSCOPY WITH PROPOFOL;  Surgeon: Lucilla Lame, MD;  Location: [place];  Service: Endoscopy;  Laterality: N/A;  with biopsies  . NASAL SEPTUM SURGERY  2004  . SHOULDER SURGERY Left 2007  . Testicular torsion  1980s  . VASECTOMY      There were no vitals filed for this visit.      Subjective Assessment - 12/07/16 1552    Limitations Standing;Lifting;Walking;House hold activities   Diagnostic tests X-ray (75% thinning of vertebral spacing).     Patient Stated Goals Return to gym/ Lose 20 lbs./ return to hiking and hunting.     Currently in Pain? Yes      Pt. reports  no new complaints.  Pt. states he is really stiff in low back.  Pt. reports feeling better after last PT tx. session.     Objective:  Manual tx: Supine hamstring/ piriformis stretches (50mn.). Supine neural glides/ LAD to L and R LE 3x30sec.(as tolerated)- decrease pain symptoms. Prone STM to lumbar paraspinals/ Deep STM to piriformis/ sup. Glut. musculatureon R (5 min.). Prone grade II-III PA mobs. To T10-L5 vertebrae (unilateral/ central). Ther.ex.: Reviewed new HEP.  Seated ball ex/ supine ball lumbar extension 5x with holds.  Good technique with clamshells.    Pt response for medical necessity: Pt. Able to progress with core stability ex. But requires cuing to return to neutral positioning. Increase back discomfort/ stiffness with position changes from supine. Pt. Progressing well with core stability ex. (addition of resistance) with no increase c/o pain.   Significant lumbar hypomobility during mobs./ STM to lumbar paraspinals.  No change in home stretching/ core stability program.  PT has noted a decrease in superior glut./lumbar trigger points.          PT Long Term Goals - 11/24/16 1806      PT LONG TERM GOAL #1   Title Pt. I with HEP to develop core stability/ progressive HEP to improve pain-free mobility and return to gym based ex.    Baseline Pt. participating with ex. program/ has  not returned to gym based ex.   Time 4   Period Weeks   Status Partially Met     PT LONG TERM GOAL #2   Title Pt. will decrease MODI to <20% to improve pain-free lumbar mobility with daily functional tasks.     Baseline 10/26/16 MODI 36% self-perceived moderate disability.     Time 4   Period Weeks   Status On-going     PT LONG TERM GOAL #3   Title Pt. will report no radicular symptoms into buttocks with work-related task/ exercises.     Baseline Primarily c/o L LE radicular symptoms but will occasional have pain on R side.     Time 4   Period Weeks   Status Not Met     PT  LONG TERM GOAL #4   Title Pt. will return to 30 minutes of gym based ex. with no increase c/o low back pain.     Baseline currently not attending gym due to back pain.   Time 4   Period Weeks   Status On-going             Plan - 12/07/16 1552    Rehab Potential Good   PT Frequency 2x / week   PT Duration 4 weeks   PT Treatment/Interventions ADLs/Self Care Home Management;Aquatic Therapy;Cryotherapy;Electrical Stimulation;Traction;Dry needling;Passive range of motion;Manual techniques;Neuromuscular re-education;Patient/family education;Therapeutic exercise;Therapeutic activities;Functional mobility training;Gait training   PT Next Visit Plan Prone manual tx. if no increase muscle pain/soreness with addition of core stability ex. program.    Consulted and Agree with Plan of Care Patient      Patient will benefit from skilled therapeutic intervention in order to improve the following deficits and impairments:  Pain, Improper body mechanics, Postural dysfunction, Decreased activity tolerance, Decreased endurance, Decreased range of motion, Decreased strength, Hypomobility, Difficulty walking  Visit Diagnosis: Chronic bilateral low back pain with bilateral sciatica  Muscle weakness (generalized)  Joint stiffness     Problem List Patient Active Problem List   Diagnosis Date Noted  . Angioedema   . Encounter for medication monitoring 08/25/2016  . Coronary artery disease 08/25/2016  . Unstable angina ([facility])   . Diabetes mellitus ([facility])   . Chest pain 08/08/2016  . Preventative health care 08/02/2016  . Ketonuria 07/12/2016  . Glucosuria 07/12/2016  . New onset type 1 diabetes mellitus, uncontrolled ([facility]) 07/08/2016  . 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 Palpitations 08/12/2015  . Family history of malignant neoplasm of gastrointestinal tract   . Benign neoplasm of rectosigmoid junction   . Essential hypertension 05/28/2015  . Gout 05/28/2015  . IBS (irritable bowel  syndrome) 05/28/2015    C , PT, DPT # [id number] 12/07/2016, 3:53 PM  [facility] [address] [place], [place], [address] Phone: [phone]   Fax:  [phone]  Name: Steve Shaker MRN: [id number] Date of Birth: 12/12/1971    

## 2016-12-13 ENCOUNTER — Ambulatory Visit: Payer: Managed Care, Other (non HMO) | Admitting: Physical Therapy

## 2016-12-13 DIAGNOSIS — M5441 Lumbago with sciatica, right side: Principal | ICD-10-CM

## 2016-12-13 DIAGNOSIS — M5442 Lumbago with sciatica, left side: Principal | ICD-10-CM

## 2016-12-13 DIAGNOSIS — M6281 Muscle weakness (generalized): Secondary | ICD-10-CM

## 2016-12-13 DIAGNOSIS — G8929 Other chronic pain: Secondary | ICD-10-CM

## 2016-12-13 DIAGNOSIS — M2560 Stiffness of unspecified joint, not elsewhere classified: Secondary | ICD-10-CM

## 2016-12-14 ENCOUNTER — Encounter: Payer: Managed Care, Other (non HMO) | Admitting: Physical Therapy

## 2016-12-14 NOTE — Therapy (Addendum)
[facility] [address] [place], [place], [address] Phone: [phone]   Fax:  [phone]  Physical Therapy Treatment  Patient Details  Name: John Hartman MRN: [id number] Date of Birth: 02-24-1971 Referring Provider: Dr. Erasmo Leventhal  Encounter Date: 12/13/2016      PT End of Session - 12/14/16 1239    Visit Number 11   Number of Visits 14   Date for PT Re-Evaluation 12/22/16   PT Start Time 1640   PT Stop Time 1733   PT Time Calculation (min) 53 min   Activity Tolerance Patient tolerated treatment well;Patient limited by pain   Behavior During Therapy [facility] for tasks assessed/performed      Past Medical History:  Diagnosis Date  . Angioedema   . DDD (degenerative disc disease), cervical   . Diabetes mellitus ([place])   . GERD (gastroesophageal reflux disease)   . Gout   . HTN (hypertension)   . Hypertension    CONTROLLED ON MEDS  . IBS (irritable bowel syndrome)   . Seasonal allergies   . Spinal stenosis     Past Surgical History:  Procedure Laterality Date  . ACDF with fusion  2007  . CARDIAC CATHETERIZATION Left 08/15/2016   Procedure: Left Heart Cath and Coronary Angiography;  Surgeon: Wellington Hampshire, MD;  Location: [place] [facility];  Service: Cardiovascular;  Laterality: Left;  . COLONOSCOPY WITH PROPOFOL N/A 06/26/2015   Procedure: COLONOSCOPY WITH PROPOFOL;  Surgeon: Lucilla Lame, MD;  Location: [place];  Service: Endoscopy;  Laterality: N/A;  with biopsies  . NASAL SEPTUM SURGERY  2004  . SHOULDER SURGERY Left 2007  . Testicular torsion  1980s  . VASECTOMY      There were no vitals filed for this visit.      Subjective Assessment - 12/14/16 1239    Limitations Standing;Lifting;Walking;House hold activities   Diagnostic tests X-ray (75% thinning of vertebral spacing).     Patient Stated Goals Return to gym/ Lose 20 lbs./ return to hiking and hunting.     Currently in Pain? Yes      Pt.  continues to have good and bad days.  Pt. is ready to focus more on an independent HEP and instructed to contact PT if any further issues with back in the future.      Objective:  (MH to lumbar region in supine during stretches).  Manual tx: Supine hamstring/ piriformis stretches (66mn.). Supine neural glides/ LAD to L and R LE 3x30sec.(as tolerated)- decrease pain symptoms. Prone STM to lumbar paraspinals/ Deep STM to piriformis/ sup. Glut. musculatureon R (5 min.). Prone grade II-III PA mobs. To T10-L5 vertebrae (unilateral/ central).   Ther.ex.: Discussed gym based ex..  Seated ball ex/ supine ball lumbar extension 5x with holds. Supine/sidelying clamshells.  MODI: 30%.  Pt response for medical necessity: Pt. Able to progress with core stability ex. But requires cuing to return to neutral positioning.  Pt. independent with core stability ex. (addition of resistance) with no increase c/o pain.  Discharge from PT at this time.     Pt. has shown progress with core stability ex. program but remains limited with lumbar hypomobility.  Decrease tenderness/ trigger points noted in lumbar/ superior gluteal region.  Pt. will be discharge from PT at this time with focus on HEP.  Pt. instructed to contact PT if any worsening back symptoms.           PT Long Term Goals - 11/24/16 1806  PT LONG TERM GOAL #1   Title Pt. I with HEP to develop core stability/ progressive HEP to improve pain-free mobility and return to gym based ex.    Baseline Pt. participating with ex. program/ has not returned to gym based ex.   Time 4   Period Weeks   Status Partially Met     PT LONG TERM GOAL #2   Title Pt. will decrease MODI to <20% to improve pain-free lumbar mobility with daily functional tasks.     Baseline 10/26/16 MODI 36% self-perceived moderate disability.     Time 4   Period Weeks   Status On-going     PT LONG TERM GOAL #3   Title Pt. will report no radicular symptoms into  buttocks with work-related task/ exercises.     Baseline Primarily c/o L LE radicular symptoms but will occasional have pain on R side.     Time 4   Period Weeks   Status Not Met     PT LONG TERM GOAL #4   Title Pt. will return to 30 minutes of gym based ex. with no increase c/o low back pain.     Baseline currently not attending gym due to back pain.   Time 4   Period Weeks   Status On-going               Plan - 12/14/16 1239    Rehab Potential Good   PT Frequency 2x / week   PT Duration 4 weeks   PT Treatment/Interventions ADLs/Self Care Home Management;Aquatic Therapy;Cryotherapy;Electrical Stimulation;Traction;Dry needling;Passive range of motion;Manual techniques;Neuromuscular re-education;Patient/family education;Therapeutic exercise;Therapeutic activities;Functional mobility training;Gait training   PT Next Visit Plan Discharge   Consulted and Agree with Plan of Care Patient      Patient will benefit from skilled therapeutic intervention in order to improve the following deficits and impairments:  Pain, Improper body mechanics, Postural dysfunction, Decreased activity tolerance, Decreased endurance, Decreased range of motion, Decreased strength, Hypomobility, Difficulty walking  Visit Diagnosis: Chronic bilateral low back pain with bilateral sciatica  Muscle weakness (generalized)  Joint stiffness     Problem List Patient Active Problem List   Diagnosis Date Noted  . Angioedema   . Encounter for medication monitoring 08/25/2016  . Coronary artery disease 08/25/2016  . Unstable angina ([place])   . Diabetes mellitus ([place])   . Chest pain 08/08/2016  . Preventative health care 08/02/2016  . Ketonuria 07/12/2016  . Glucosuria 07/12/2016  . New onset type 1 diabetes mellitus, uncontrolled ([place]) 07/08/2016  . 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 Palpitations 08/12/2015  . Family history of malignant neoplasm of gastrointestinal tract   . Benign neoplasm of  rectosigmoid junction   . Essential hypertension 05/28/2015  . Gout 05/28/2015  . IBS (irritable bowel syndrome) 05/28/2015   Pura Spice, PT, DPT # [id number] 12/14/2016, 12:40 PM  [place] [facility] [address] [place], [place], [address] Phone: [phone]   Fax:  [phone]  Name: John Hartman MRN: [id number] Date of Birth: 06-25-1971

## 2016-12-26 NOTE — Addendum Note (Signed)
Addended by: Dorcas Carrow C on: 12/26/2016 11:54 AM   Modules accepted: Orders

## 2016-12-28 ENCOUNTER — Other Ambulatory Visit: Payer: Self-pay

## 2016-12-28 DIAGNOSIS — Z5181 Encounter for therapeutic drug level monitoring: Secondary | ICD-10-CM

## 2016-12-28 DIAGNOSIS — I2510 Atherosclerotic heart disease of native coronary artery without angina pectoris: Secondary | ICD-10-CM

## 2016-12-28 LAB — LIPID PANEL
Cholesterol: 135 mg/dL (ref <200)
HDL: 30 mg/dL — AB (ref >40)
LDL Cholesterol: 86 mg/dL (ref <100)
Total CHOL/HDL Ratio: 4.5 Ratio (ref <5.0)
Triglycerides: 96 mg/dL (ref <150)
VLDL: 19 mg/dL (ref <30)

## 2016-12-28 LAB — ALT: ALT: 68 U/L — AB (ref 9–46)

## 2017-01-04 ENCOUNTER — Other Ambulatory Visit: Payer: Self-pay | Admitting: Family Medicine

## 2017-01-04 DIAGNOSIS — R740 Nonspecific elevation of levels of transaminase and lactic acid dehydrogenase [LDH]: Secondary | ICD-10-CM

## 2017-01-04 DIAGNOSIS — E785 Hyperlipidemia, unspecified: Secondary | ICD-10-CM | POA: Insufficient documentation

## 2017-01-04 DIAGNOSIS — R7401 Elevation of levels of liver transaminase levels: Secondary | ICD-10-CM | POA: Insufficient documentation

## 2017-01-04 NOTE — Assessment & Plan Note (Signed)
Recheck LFTs in 6 weeks

## 2017-01-04 NOTE — Assessment & Plan Note (Signed)
Work on weight loss, diet, recheck lipids in 6 weeks; adjust therapy if not to goal

## 2017-01-04 NOTE — Progress Notes (Signed)
Try TLC, weight loss; recheck labs in 6 weeks

## 2017-01-05 ENCOUNTER — Other Ambulatory Visit: Payer: Self-pay | Admitting: Family Medicine

## 2017-01-05 ENCOUNTER — Other Ambulatory Visit: Payer: Self-pay

## 2017-01-05 NOTE — Telephone Encounter (Signed)
He needs to have labs done

## 2017-01-05 NOTE — Telephone Encounter (Signed)
Just had labs done 1/3

## 2017-01-05 NOTE — Telephone Encounter (Signed)
Left detailed voicemail

## 2017-01-06 MED ORDER — ATORVASTATIN CALCIUM 40 MG PO TABS: 40.0000 mg | ORAL_TABLET | Freq: Every day | ORAL | 0 refills | Status: DC

## 2017-01-27 ENCOUNTER — Encounter: Payer: Self-pay | Admitting: Family Medicine

## 2017-02-10 ENCOUNTER — Other Ambulatory Visit: Payer: Self-pay

## 2017-02-10 DIAGNOSIS — R7401 Elevation of levels of liver transaminase levels: Secondary | ICD-10-CM

## 2017-02-10 DIAGNOSIS — E785 Hyperlipidemia, unspecified: Secondary | ICD-10-CM

## 2017-02-10 DIAGNOSIS — R740 Nonspecific elevation of levels of transaminase and lactic acid dehydrogenase [LDH]: Secondary | ICD-10-CM

## 2017-02-10 LAB — HEPATIC FUNCTION PANEL
ALT: 75 U/L — AB (ref 9–46)
AST: 48 U/L — ABNORMAL HIGH (ref 10–40)
Albumin: 4.6 g/dL (ref 3.6–5.1)
Alkaline Phosphatase: 75 U/L (ref 40–115)
BILIRUBIN DIRECT: 0.3 mg/dL — AB (ref <=0.2)
BILIRUBIN INDIRECT: 1.3 mg/dL — AB (ref 0.2–1.2)
TOTAL PROTEIN: 7 g/dL (ref 6.1–8.1)
Total Bilirubin: 1.6 mg/dL — ABNORMAL HIGH (ref 0.2–1.2)

## 2017-02-10 LAB — LIPID PANEL
CHOL/HDL RATIO: 4.2 ratio (ref <5.0)
CHOLESTEROL: 123 mg/dL (ref <200)
HDL: 29 mg/dL — ABNORMAL LOW (ref >40)
LDL CALC: 68 mg/dL (ref <100)
TRIGLYCERIDES: 132 mg/dL (ref <150)
VLDL: 26 mg/dL (ref <30)

## 2017-02-11 ENCOUNTER — Other Ambulatory Visit: Payer: Self-pay | Admitting: Family Medicine

## 2017-02-11 DIAGNOSIS — R748 Abnormal levels of other serum enzymes: Secondary | ICD-10-CM | POA: Insufficient documentation

## 2017-02-11 DIAGNOSIS — R17 Unspecified jaundice: Secondary | ICD-10-CM | POA: Insufficient documentation

## 2017-02-11 NOTE — Assessment & Plan Note (Signed)
Check liver US

## 2017-02-11 NOTE — Assessment & Plan Note (Signed)
Possible [place]

## 2017-02-11 NOTE — Progress Notes (Signed)
Orders for [place] entered Orders for repeat LFTs in one month ordered

## 2017-02-13 ENCOUNTER — Other Ambulatory Visit: Payer: Self-pay

## 2017-02-13 ENCOUNTER — Other Ambulatory Visit: Payer: Self-pay | Admitting: Family Medicine

## 2017-02-13 MED ORDER — METOPROLOL TARTRATE 25 MG PO TABS: ORAL_TABLET | ORAL | 3 refills | Status: DC

## 2017-02-13 MED ORDER — ATORVASTATIN CALCIUM 40 MG PO TABS: 40.0000 mg | ORAL_TABLET | Freq: Every day | ORAL | 0 refills | Status: DC

## 2017-02-13 NOTE — Telephone Encounter (Signed)
rxs sent

## 2017-02-13 NOTE — Telephone Encounter (Signed)
Pt states he switched pharmacies. He is now using [facility] [place] due to insurance and needs refills on Astorvastin and Metroprolol.

## 2017-02-17 ENCOUNTER — Encounter: Payer: Self-pay | Admitting: Family Medicine

## 2017-02-20 ENCOUNTER — Ambulatory Visit: Payer: 59

## 2017-03-20 ENCOUNTER — Other Ambulatory Visit: Payer: Self-pay

## 2017-03-20 DIAGNOSIS — R17 Unspecified jaundice: Secondary | ICD-10-CM

## 2017-03-20 DIAGNOSIS — R748 Abnormal levels of other serum enzymes: Secondary | ICD-10-CM

## 2017-03-20 LAB — HEPATIC FUNCTION PANEL
ALT: 76 U/L — ABNORMAL HIGH (ref 9–46)
AST: 46 U/L — ABNORMAL HIGH (ref 10–40)
Albumin: 4.3 g/dL (ref 3.6–5.1)
Alkaline Phosphatase: 82 U/L (ref 40–115)
Bilirubin, Direct: 0.3 mg/dL — ABNORMAL HIGH (ref <=0.2)
Indirect Bilirubin: 0.9 mg/dL (ref 0.2–1.2)
Total Bilirubin: 1.2 mg/dL (ref 0.2–1.2)
Total Protein: 6.7 g/dL (ref 6.1–8.1)

## 2017-03-31 ENCOUNTER — Encounter: Payer: Self-pay | Admitting: Family Medicine

## 2017-03-31 ENCOUNTER — Ambulatory Visit
Admission: RE | Admit: 2017-03-31 | Discharge: 2017-03-31 | Disposition: A | Discharge status: Home / Self Care | Payer: 59 | Source: Ambulatory Visit | Attending: Family Medicine | Admitting: Family Medicine

## 2017-03-31 DIAGNOSIS — R17 Unspecified jaundice: Secondary | ICD-10-CM

## 2017-03-31 DIAGNOSIS — R748 Abnormal levels of other serum enzymes: Secondary | ICD-10-CM | POA: Diagnosis present

## 2017-03-31 DIAGNOSIS — K760 Fatty (change of) liver, not elsewhere classified: Secondary | ICD-10-CM | POA: Insufficient documentation

## 2017-03-31 HISTORY — DX: Fatty (change of) liver, not elsewhere classified: K76.0

## 2017-04-25 ENCOUNTER — Encounter: Payer: Self-pay | Admitting: Family Medicine

## 2017-04-25 DIAGNOSIS — Z794 Long term (current) use of insulin: Principal | ICD-10-CM

## 2017-04-25 DIAGNOSIS — E119 Type 2 diabetes mellitus without complications: Secondary | ICD-10-CM

## 2017-05-03 ENCOUNTER — Other Ambulatory Visit: Payer: Self-pay | Admitting: Family Medicine

## 2017-05-03 NOTE — Telephone Encounter (Signed)
Rx approved

## 2017-05-11 ENCOUNTER — Other Ambulatory Visit: Payer: Self-pay | Admitting: Family Medicine

## 2017-05-11 NOTE — Telephone Encounter (Signed)
Fatty liver on [place]; reviewed LFTs; important to treat lipids; continue statin

## 2017-08-04 ENCOUNTER — Ambulatory Visit (INDEPENDENT_AMBULATORY_CARE_PROVIDER_SITE_OTHER): Payer: 59 | Admitting: Family Medicine

## 2017-08-04 ENCOUNTER — Encounter: Payer: Self-pay | Admitting: Family Medicine

## 2017-08-04 DIAGNOSIS — M4696 Unspecified inflammatory spondylopathy, lumbar region: Secondary | ICD-10-CM

## 2017-08-04 DIAGNOSIS — K760 Fatty (change of) liver, not elsewhere classified: Secondary | ICD-10-CM | POA: Diagnosis not present

## 2017-08-04 DIAGNOSIS — I700 Atherosclerosis of aorta: Secondary | ICD-10-CM

## 2017-08-04 DIAGNOSIS — Z0000 Encounter for general adult medical examination without abnormal findings: Secondary | ICD-10-CM

## 2017-08-04 DIAGNOSIS — G8929 Other chronic pain: Secondary | ICD-10-CM | POA: Insufficient documentation

## 2017-08-04 DIAGNOSIS — M51369 Other intervertebral disc degeneration, lumbar region without mention of lumbar back pain or lower extremity pain: Secondary | ICD-10-CM

## 2017-08-04 DIAGNOSIS — M1A9XX0 Chronic gout, unspecified, without tophus (tophi): Secondary | ICD-10-CM | POA: Diagnosis not present

## 2017-08-04 DIAGNOSIS — M5136 Other intervertebral disc degeneration, lumbar region: Secondary | ICD-10-CM | POA: Diagnosis not present

## 2017-08-04 DIAGNOSIS — M545 Low back pain, unspecified: Secondary | ICD-10-CM

## 2017-08-04 DIAGNOSIS — I2510 Atherosclerotic heart disease of native coronary artery without angina pectoris: Secondary | ICD-10-CM

## 2017-08-04 DIAGNOSIS — M47816 Spondylosis without myelopathy or radiculopathy, lumbar region: Secondary | ICD-10-CM | POA: Insufficient documentation

## 2017-08-04 LAB — CBC WITH DIFFERENTIAL/PLATELET
BASOS ABS: 78 {cells}/uL (ref 0–200)
BASOS PCT: 1 %
EOS ABS: 468 {cells}/uL (ref 15–500)
Eosinophils Relative: 6 %
HCT: 51.1 % — ABNORMAL HIGH (ref 38.5–50.0)
HEMOGLOBIN: 17.2 g/dL — AB (ref 13.2–17.1)
LYMPHS ABS: 3198 {cells}/uL (ref 850–3900)
Lymphocytes Relative: 41 %
MCH: 28.6 pg (ref 27.0–33.0)
MCHC: 33.7 g/dL (ref 32.0–36.0)
MCV: 85 fL (ref 80.0–100.0)
MONO ABS: 624 {cells}/uL (ref 200–950)
MPV: 10.2 fL (ref 7.5–12.5)
Monocytes Relative: 8 %
NEUTROS ABS: 3432 {cells}/uL (ref 1500–7800)
Neutrophils Relative %: 44 %
PLATELETS: 195 10*3/uL (ref 140–400)
RBC: 6.01 MIL/uL — ABNORMAL HIGH (ref 4.20–5.80)
RDW: 14.2 % (ref 11.0–15.0)
WBC: 7.8 10*3/uL (ref 3.8–10.8)

## 2017-08-04 LAB — TSH: TSH: 1.43 m[IU]/L (ref 0.40–4.50)

## 2017-08-04 NOTE — Assessment & Plan Note (Signed)
Had heart cath in 2017; 20%; on statin

## 2017-08-04 NOTE — Patient Instructions (Addendum)
We'll get labs today We'll refer you to the pain clinic   Health Maintenance, Male A healthy lifestyle and preventive care is important for your health and wellness. Ask your health care provider about what schedule of regular examinations is right for you. What should I know about weight and diet? Eat a Healthy Diet  Eat plenty of vegetables, fruits, whole grains, low-fat dairy products, and lean protein.  Do not eat a lot of foods high in solid fats, added sugars, or salt.  Maintain a Healthy Weight Regular exercise can help you achieve or maintain a healthy weight. You should:  Do at least 150 minutes of exercise each week. The exercise should increase your heart rate and make you sweat (moderate-intensity exercise).  Do strength-training exercises at least twice a week.  Watch Your Levels of Cholesterol and Blood Lipids  Have your blood tested for lipids and cholesterol every 5 years starting at 44 years of age. If you are at high risk for heart disease, you should start having your blood tested when you are 44 years old. You may need to have your cholesterol levels checked more often if: ? Your lipid or cholesterol levels are high. ? You are older than 44 years of age. ? You are at high risk for heart disease.  What should I know about cancer screening? Many types of cancers can be detected early and may often be prevented. Lung Cancer  You should be screened every year for lung cancer if: ? You are a current smoker who has smoked for at least 30 years. ? You are a former smoker who has quit within the past 15 years.  Talk to your health care provider about your screening options, when you should start screening, and how often you should be screened.  Colorectal Cancer  Routine colorectal cancer screening usually begins at 44 years of age and should be repeated every 5-10 years until you are 44 years old. You may need to be screened more often if early forms of  precancerous polyps or small growths are found. Your health care provider may recommend screening at an earlier age if you have risk factors for colon cancer.  Your health care provider may recommend using home test kits to check for hidden blood in the stool.  A small camera at the end of a tube can be used to examine your colon (sigmoidoscopy or colonoscopy). This checks for the earliest forms of colorectal cancer.  Prostate and Testicular Cancer  Depending on your age and overall health, your health care provider may do certain tests to screen for prostate and testicular cancer.  Talk to your health care provider about any symptoms or concerns you have about testicular or prostate cancer.  Skin Cancer  Check your skin from head to toe regularly.  Tell your health care provider about any new moles or changes in moles, especially if: ? There is a change in a mole's size, shape, or color. ? You have a mole that is larger than a pencil eraser.  Always use sunscreen. Apply sunscreen liberally and repeat throughout the day.  Protect yourself by wearing long sleeves, pants, a wide-brimmed hat, and sunglasses when outside.  What should I know about heart disease, diabetes, and high blood pressure?  If you are 37-53 years of age, have your blood pressure checked every 3-5 years. If you are 37 years of age or older, have your blood pressure checked every year. You should have your blood pressure  measured twice-once when you are at a hospital or clinic, and once when you are not at a hospital or clinic. Record the average of the two measurements. To check your blood pressure when you are not at a hospital or clinic, you can use: ? An automated blood pressure machine at a pharmacy. ? A home blood pressure monitor.  Talk to your health care provider about your target blood pressure.  If you are between 32-9 years old, ask your health care provider if you should take aspirin to prevent heart  disease.  Have regular diabetes screenings by checking your fasting blood sugar level. ? If you are at a normal weight and have a low risk for diabetes, have this test once every three years after the age of 60. ? If you are overweight and have a high risk for diabetes, consider being tested at a younger age or more often.  A one-time screening for abdominal aortic aneurysm (AAA) by ultrasound is recommended for men aged 60-75 years who are current or former smokers. What should I know about preventing infection? Hepatitis B If you have a higher risk for hepatitis B, you should be screened for this virus. Talk with your health care provider to find out if you are at risk for hepatitis B infection. Hepatitis C Blood testing is recommended for:  Everyone born from [birth year] through 1963.  Anyone with known risk factors for hepatitis C.  Sexually Transmitted Diseases (STDs)  You should be screened each year for STDs including gonorrhea and chlamydia if: ? You are sexually active and are younger than 44 years of age. ? You are older than 44 years of age and your health care provider tells you that you are at risk for this type of infection. ? Your sexual activity has changed since you were last screened and you are at an increased risk for chlamydia or gonorrhea. Ask your health care provider if you are at risk.  Talk with your health care provider about whether you are at high risk of being infected with HIV. Your health care provider may recommend a prescription medicine to help prevent HIV infection.  What else can I do?  Schedule regular health, dental, and eye exams.  Stay current with your vaccines (immunizations).  Do not use any tobacco products, such as cigarettes, chewing tobacco, and e-cigarettes. If you need help quitting, ask your health care provider.  Limit alcohol intake to no more than 2 drinks per day. One drink equals 12 ounces of beer, 5 ounces of wine, or 1 ounces of  hard liquor.  Do not use street drugs.  Do not share needles.  Ask your health care provider for help if you need support or information about quitting drugs.  Tell your health care provider if you often feel depressed.  Tell your health care provider if you have ever been abused or do not feel safe at home. This information is not intended to replace advice given to you by your health care provider. Make sure you discuss any questions you have with your health care provider. Document Released: 06/09/2008 Document Revised: 08/10/2016 Document Reviewed: 09/15/2015 Elsevier Interactive Patient Education  [employer].

## 2017-08-04 NOTE — Assessment & Plan Note (Signed)
Check uric acid. 

## 2017-08-04 NOTE — Assessment & Plan Note (Signed)
Check liver enzymes 

## 2017-08-04 NOTE — Progress Notes (Signed)
Patient ID: John Hartman, male   DOB: 02-09-1971, 46 y.o.   MRN: [id number]   Subjective:   John Hartman is a 46 y.o. male here for a complete physical exam  Interim issues since last visit: no medical excitement Type 2 diabetes; managed by endo; pretty good overall he says; had to switch glucometers; A1c has been 6.5 and then 6.9  USPSTF grade A and B recommendations reviewed  Depression: internalizes things he whole life; hx of back problems; not a lot to do the next step until he needs surgery; that's not far off; terrible DDD, "no cushion", bad arthritis, bone spurs, spinal stenosis, already had surgery; in chronic pain, living on aleve and ibuprofen (not taking them together, one OR the other); now using two aleve twice a day; wakes up and sees his sleep patterns at night, falls asleep and wakes up between 1-3 am, gets a few hours of good sleep, then back starts hurting so bad, then constant tossing; heating pad or ice; episode last year and never came back, had xrays, getting progressively worse; seeing spine doctor; has six inch plate in his neck; trying to walk in the neighborhood; in pain all the time; a loop around the neighborhood is 1.5 miles, some days he can make it and other days, he can only go 200 to 300 yards; did PT in [place] last year, 2 months; he has had weird IBS and angioedema, has gotten severely ill that lasts a few weeks, sx of RMSF, meningitis; wonders about autoimmune disease; thought once it was Jossie Ng  CLINICAL DATA:  Lumbago without radicular symptoms  EXAM: LUMBAR SPINE - COMPLETE 4+ VIEW  COMPARISON:  None.  FINDINGS: Frontal, lateral, spot lumbosacral lateral, and bilateral oblique views were obtained. There are 5 non-rib-bearing lumbar type vertebral bodies. There is no fracture or spondylolisthesis. There is mild disc space narrowing at all levels, most notably at L1-2 and L4-5. There are anterior osteophytes at L1, L2, and L3. There is facet  arthropathic change at L5-S1 bilaterally. There are scattered foci of calcification in the aorta.  IMPRESSION: Osteoarthritic change at multiple levels. No fracture or spondylolisthesis.  Electronically Signed   By: Lowella Grip III M.D.   On: 03/21/2016 11:22  Depression screen [facility] 2/9 08/04/2017 08/04/2017 08/01/2016 08/01/2016  Decreased Interest (No Data) 1 0 0  Down, Depressed, Hopeless - 3 0 0  PHQ - 2 Score - 4 0 0  Altered sleeping - 3 - -  Tired, decreased energy - 1 - -  Change in appetite - 1 - -  Feeling bad or failure about yourself  - 1 - -  Trouble concentrating - 0 - -  Moving slowly or fidgety/restless - 0 - -  Suicidal thoughts - 0 - -  PHQ-9 Score - 10 - -  Difficult doing work/chores - Not difficult at all - -   Hypertension: BP Readings from Last 3 Encounters:  08/04/17 116/78  09/12/16 124/90  08/28/16 121/90   Obesity: not able to be as active; used to be an athlete, back problems  Wt Readings from Last 3 Encounters:  08/04/17 228 lb (103.4 kg)  09/26/16 223 lb 1.6 oz (101.2 kg)  09/12/16 221 lb 14.4 oz (100.7 kg)   BMI Readings from Last 3 Encounters:  08/04/17 31.80 kg/m  09/26/16 30.26 kg/m  09/12/16 30.10 kg/m    Alcohol: no Tobacco use: former HIV, hep B, hep C: not intersted Married STD testing and prevention (chl/gon/syphilis): not interested  Lipids:  Lab Results  Component Value Date   CHOL 140 08/04/2017   CHOL 123 02/10/2017   CHOL 135 12/28/2016   Lab Results  Component Value Date   HDL 34 (L) 08/04/2017   HDL 29 (L) 02/10/2017   HDL 30 (L) 12/28/2016   Lab Results  Component Value Date   LDLCALC 88 08/04/2017   LDLCALC 68 02/10/2017   LDLCALC 86 12/28/2016   Lab Results  Component Value Date   TRIG 90 08/04/2017   TRIG 132 02/10/2017   TRIG 96 12/28/2016   Lab Results  Component Value Date   CHOLHDL 4.1 08/04/2017   CHOLHDL 4.2 02/10/2017   CHOLHDL 4.5 12/28/2016   No results found for:  LDLDIRECT Glucose:  Glucose, Bld  Date Value Ref Range Status  08/04/2017 118 (H) 65 - 99 mg/dL Final  08/28/2016 175 (H) 65 - 99 mg/dL Final  08/27/2016 156 (H) 65 - 99 mg/dL Final   Glucose-Capillary  Date Value Ref Range Status  08/28/2016 137 (H) 65 - 99 mg/dL Final  08/28/2016 161 (H) 65 - 99 mg/dL Final  08/27/2016 186 (H) 65 - 99 mg/dL Final   Colorectal cancer: done 2016; next due 2021 Prostate cancer: no family hx No results found for: PSA Lung cancer:  n/a AAA: n/a Aspirin: no Diet: fruits and veggies; not cooking as much; doing well overall, fish; not much dairy, some salads, broccoli and kale Exercise: tries to walk, has pain, see above Skin cancer: no worrisome moles  Past Medical History:  Diagnosis Date  . Angioedema   . DDD (degenerative disc disease), cervical   . Diabetes mellitus ([place])   . Fatty liver 03/31/2017   [place] April 2018  . GERD (gastroesophageal reflux disease)   . Gout   . HTN (hypertension)   . Hypertension    CONTROLLED ON MEDS  . IBS (irritable bowel syndrome)   . Seasonal allergies   . Spinal stenosis    Past Surgical History:  Procedure Laterality Date  . ACDF with fusion  2007  . CARDIAC CATHETERIZATION Left 08/15/2016   Procedure: Left Heart Cath and Coronary Angiography;  Surgeon: Wellington Hampshire, MD;  Location: [facility];  Service: Cardiovascular;  Laterality: Left;  . COLONOSCOPY WITH PROPOFOL N/A 06/26/2015   Procedure: COLONOSCOPY WITH PROPOFOL;  Surgeon: Lucilla Lame, MD;  Location: [facility];  Service: Endoscopy;  Laterality: N/A;  with biopsies  . NASAL SEPTUM SURGERY  2004  . SHOULDER SURGERY Left 2007  . Testicular torsion  1980s  . VASECTOMY     Family History  Problem Relation Age of Onset  . Breast cancer Mother   . Skin cancer Mother   . Arrhythmia Mother        A-fib  . Cancer Mother        breast, colon?, skin  . Hypertension Father   . Diabetes Father   . Cancer Maternal Grandfather   .  Heart disease Brother        stent  . Allergies Son   . Alzheimer's disease Maternal Grandmother   . Stroke Neg Hx   . COPD Neg Hx   Mother may exaggerate her cancers; patient says she has had skin cancer; he thinks she hears "cyst" and might think it's cancer; he thinks she may have had a breast cyst, not breast cancer; never done chemo Social History  Substance Use Topics  . Smoking status: Former Smoker    Packs/day: 1.00  Years: 10.00    Types: Cigarettes    Quit date: 04/15/2015  . Smokeless tobacco: Never Used  . Alcohol use No   Review of Systems  Objective:   Vitals:   08/04/17 1109  BP: 116/78  Pulse: 73  Resp: 14  Temp: 98.3 F (36.8 C)  TempSrc: Oral  SpO2: 96%  Weight: 228 lb (103.4 kg)  Height: '5\' 11"'  (1.803 m)   Body mass index is 31.8 kg/m. Wt Readings from Last 3 Encounters:  08/04/17 228 lb (103.4 kg)  09/26/16 223 lb 1.6 oz (101.2 kg)  09/12/16 221 lb 14.4 oz (100.7 kg)   Physical Exam  Constitutional: He appears well-developed and well-nourished. No distress.  HENT:  Head: Normocephalic and atraumatic.  Nose: Nose normal.  Mouth/Throat: Oropharynx is clear and moist.  Eyes: EOM are normal. No scleral icterus.  Neck: No JVD present. No thyromegaly present.  Cardiovascular: Normal rate, regular rhythm and normal heart sounds.   Pulmonary/Chest: Effort normal and breath sounds normal. No respiratory distress. He has no wheezes. He has no rales.  Abdominal: Soft. Bowel sounds are normal. He exhibits no distension. There is no tenderness. There is no guarding.  Musculoskeletal: Normal range of motion. He exhibits no edema.  Lymphadenopathy:    He has no cervical adenopathy.  Neurological: He is alert. He displays normal reflexes. He exhibits normal muscle tone.  Skin: Skin is warm and dry. No rash noted. He is not diaphoretic. No erythema. No pallor.  Psychiatric: He has a normal mood and affect. His behavior is normal. Judgment and thought  content normal.    Diabetic Foot Form - Detailed   Diabetic Foot Exam - detailed Diabetic Foot exam was performed with the following findings:  Yes 08/04/2017 11:46 AM  Visual Foot Exam completed.:  Yes  Pulse Foot Exam completed.:  Yes  Right Dorsalis Pedis:  Present Left Dorsalis Pedis:  Present  Sensory Foot Exam Completed.:  Yes Semmes-Weinstein Monofilament Test R Site 1-Great Toe:  Pos L Site 1-Great Toe:  Pos       Assessment/Plan:   Problem List Items Addressed This Visit      Cardiovascular and Mediastinum   Coronary artery disease    Had heart cath in 2017; 20%; on statin      Calcification of abdominal aorta ([place])    Found on xray; goal LDL less than 70; quit smoking 14-15 years ago (great news)        Digestive   Fatty liver    Check liver enzymes        Musculoskeletal and Integument   Degenerative disc disease, lumbar    Chronic pain, refer to pain clinic      Relevant Medications   naproxen sodium (ANAPROX) 220 MG tablet   Other Relevant Orders   Ambulatory referral to Pain Clinic   Arthritis, 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[place])    Refer to pain clinic      Relevant Medications   naproxen sodium (ANAPROX) 220 MG tablet   Other Relevant Orders   Ambulatory referral to Pain Clinic     Other   Preventative health care    USPSTF grade A and B recommendations reviewed with patient; age-appropriate recommendations, preventive care, screening tests, etc discussed and encouraged; healthy living encouraged; see AVS for patient education given to patient       Relevant Orders   CBC with Differential/Platelet (Completed)   COMPLETE METABOLIC PANEL WITH GFR (Completed)   Lipid panel (Completed)   TSH (  Completed)   Gout    Check uric acid      Relevant Orders   Uric acid (Completed)   Chronic lower back pain    Discussed with patient; he has seen ortho, suggested testing for HLA-B27; affecting his quality of life; offered referral to pain clinic; trying to  exercise and lose weight      Relevant Medications   naproxen sodium (ANAPROX) 220 MG tablet   Other Relevant Orders   HLA-B27 Antigen (Completed)   ANA,IFA RA Diag Pnl w/rflx Tit/Patn (Completed)   VITAMIN D 25 Hydroxy (Vit-D Deficiency, Fractures) (Completed)   Ambulatory referral to Pain Clinic      Meds ordered this encounter  Medications  . naproxen sodium (ANAPROX) 220 MG tablet    Sig: Take 1-2 tablets (220-440 mg total) by mouth 2 (two) times daily with a meal.   Orders Placed This Encounter  Procedures  . HLA-B27 Antigen  . ANA,IFA RA Diag Pnl w/rflx Tit/Patn  . CBC with Differential/Platelet  . COMPLETE METABOLIC PANEL WITH GFR  . Lipid panel  . VITAMIN D 25 Hydroxy (Vit-D Deficiency, Fractures)  . TSH  . Uric acid  . Ambulatory referral to Pain Clinic    Referral Priority:   Routine    Referral Type:   Consultation    Referral Reason:   Specialty Services Required    Requested Specialty:   Pain Medicine    Number of Visits Requested:   1    Follow up plan: Return in about 1 year (around 08/04/2018) for complete physical.  An After Visit Summary was printed and given to the patient.

## 2017-08-04 NOTE — Assessment & Plan Note (Signed)
Chronic pain, refer to pain clinic

## 2017-08-04 NOTE — Assessment & Plan Note (Signed)
Refer to pain clinic? 

## 2017-08-04 NOTE — Assessment & Plan Note (Signed)
Discussed with patient; he has seen ortho, suggested testing for HLA-B27; affecting his quality of life; offered referral to pain clinic; trying to exercise and lose weight

## 2017-08-04 NOTE — Assessment & Plan Note (Addendum)
Found on xray; goal LDL less than 70; quit smoking 14-15 years ago (great news)

## 2017-08-04 NOTE — Assessment & Plan Note (Signed)
USPSTF grade A and B recommendations reviewed with patient; age-appropriate recommendations, preventive care, screening tests, etc discussed and encouraged; healthy living encouraged; see AVS for patient education given to patient  

## 2017-08-05 LAB — COMPLETE METABOLIC PANEL WITH GFR
ALBUMIN: 4.9 g/dL (ref 3.6–5.1)
ALT: 122 U/L — AB (ref 9–46)
AST: 71 U/L — ABNORMAL HIGH (ref 10–40)
Alkaline Phosphatase: 87 U/L (ref 40–115)
BUN: 19 mg/dL (ref 7–25)
CO2: 24 mmol/L (ref 20–32)
CREATININE: 1.2 mg/dL (ref 0.60–1.35)
Calcium: 10 mg/dL (ref 8.6–10.3)
Chloride: 101 mmol/L (ref 98–110)
GFR, EST AFRICAN AMERICAN: 85 mL/min (ref >=60)
GFR, Est Non African American: 73 mL/min (ref >=60)
Glucose, Bld: 118 mg/dL — ABNORMAL HIGH (ref 65–99)
Potassium: 4.8 mmol/L (ref 3.5–5.3)
Sodium: 139 mmol/L (ref 135–146)
TOTAL PROTEIN: 7.3 g/dL (ref 6.1–8.1)
Total Bilirubin: 1.7 mg/dL — ABNORMAL HIGH (ref 0.2–1.2)

## 2017-08-05 LAB — LIPID PANEL
CHOLESTEROL: 140 mg/dL (ref <200)
HDL: 34 mg/dL — ABNORMAL LOW (ref >40)
LDL CALC: 88 mg/dL (ref <100)
TRIGLYCERIDES: 90 mg/dL (ref <150)
Total CHOL/HDL Ratio: 4.1 Ratio (ref <5.0)
VLDL: 18 mg/dL (ref <30)

## 2017-08-05 LAB — URIC ACID: URIC ACID, SERUM: 6.4 mg/dL (ref 4.0–8.0)

## 2017-08-05 LAB — VITAMIN D 25 HYDROXY (VIT D DEFICIENCY, FRACTURES): Vit D, 25-Hydroxy: 30 ng/mL (ref 30–100)

## 2017-08-07 LAB — ANA,IFA RA DIAG PNL W/RFLX TIT/PATN
Anti Nuclear Antibody(ANA): NEGATIVE
Cyclic Citrullin Peptide Ab: <16 Units
Rhuematoid fact SerPl-aCnc: <14 IU/mL (ref <14)

## 2017-08-09 ENCOUNTER — Other Ambulatory Visit: Payer: Self-pay | Admitting: Family Medicine

## 2017-08-09 DIAGNOSIS — K760 Fatty (change of) liver, not elsewhere classified: Secondary | ICD-10-CM

## 2017-08-09 DIAGNOSIS — R748 Abnormal levels of other serum enzymes: Secondary | ICD-10-CM

## 2017-08-09 NOTE — Progress Notes (Signed)
Refer to GI for rising LFTs

## 2017-08-11 ENCOUNTER — Other Ambulatory Visit: Payer: Self-pay | Admitting: Family Medicine

## 2017-08-11 ENCOUNTER — Encounter: Payer: Self-pay | Admitting: Family Medicine

## 2017-08-11 ENCOUNTER — Encounter: Payer: Self-pay | Admitting: Gastroenterology

## 2017-08-11 DIAGNOSIS — D582 Other hemoglobinopathies: Secondary | ICD-10-CM

## 2017-08-11 LAB — HLA-B27 ANTIGEN: DNA Result:: NEGATIVE

## 2017-08-11 NOTE — Progress Notes (Signed)
Refer to pulm for elevated H/H Stop statin

## 2017-08-24 ENCOUNTER — Encounter: Payer: Self-pay | Admitting: Student in an Organized Health Care Education/Training Program

## 2017-08-24 ENCOUNTER — Ambulatory Visit
Payer: 59 | Attending: Student in an Organized Health Care Education/Training Program | Admitting: Student in an Organized Health Care Education/Training Program

## 2017-08-24 VITALS — BP 137/99 | HR 72 | Temp 98.5°F | Resp 16 | Ht 71.0 in | Wt 226.0 lb

## 2017-08-24 DIAGNOSIS — Z981 Arthrodesis status: Secondary | ICD-10-CM

## 2017-08-24 DIAGNOSIS — M5030 Other cervical disc degeneration, unspecified cervical region: Secondary | ICD-10-CM | POA: Diagnosis not present

## 2017-08-24 DIAGNOSIS — Z87442 Personal history of urinary calculi: Secondary | ICD-10-CM | POA: Insufficient documentation

## 2017-08-24 DIAGNOSIS — E785 Hyperlipidemia, unspecified: Secondary | ICD-10-CM | POA: Diagnosis not present

## 2017-08-24 DIAGNOSIS — K589 Irritable bowel syndrome without diarrhea: Secondary | ICD-10-CM | POA: Insufficient documentation

## 2017-08-24 DIAGNOSIS — M5136 Other intervertebral disc degeneration, lumbar region: Secondary | ICD-10-CM | POA: Diagnosis present

## 2017-08-24 DIAGNOSIS — I2510 Atherosclerotic heart disease of native coronary artery without angina pectoris: Secondary | ICD-10-CM | POA: Insufficient documentation

## 2017-08-24 DIAGNOSIS — I129 Hypertensive chronic kidney disease with stage 1 through stage 4 chronic kidney disease, or unspecified chronic kidney disease: Secondary | ICD-10-CM | POA: Diagnosis not present

## 2017-08-24 DIAGNOSIS — M545 Low back pain: Secondary | ICD-10-CM | POA: Diagnosis not present

## 2017-08-24 DIAGNOSIS — N189 Chronic kidney disease, unspecified: Secondary | ICD-10-CM | POA: Diagnosis not present

## 2017-08-24 DIAGNOSIS — M48061 Spinal stenosis, lumbar region without neurogenic claudication: Secondary | ICD-10-CM | POA: Insufficient documentation

## 2017-08-24 DIAGNOSIS — E119 Type 2 diabetes mellitus without complications: Secondary | ICD-10-CM | POA: Diagnosis not present

## 2017-08-24 DIAGNOSIS — K219 Gastro-esophageal reflux disease without esophagitis: Secondary | ICD-10-CM | POA: Diagnosis not present

## 2017-08-24 DIAGNOSIS — M47816 Spondylosis without myelopathy or radiculopathy, lumbar region: Secondary | ICD-10-CM | POA: Insufficient documentation

## 2017-08-24 DIAGNOSIS — G8929 Other chronic pain: Secondary | ICD-10-CM | POA: Insufficient documentation

## 2017-08-24 DIAGNOSIS — M109 Gout, unspecified: Secondary | ICD-10-CM | POA: Diagnosis not present

## 2017-08-24 DIAGNOSIS — Z794 Long term (current) use of insulin: Secondary | ICD-10-CM | POA: Insufficient documentation

## 2017-08-24 DIAGNOSIS — M4696 Unspecified inflammatory spondylopathy, lumbar region: Secondary | ICD-10-CM | POA: Diagnosis not present

## 2017-08-24 DIAGNOSIS — R740 Nonspecific elevation of levels of transaminase and lactic acid dehydrogenase [LDH]: Secondary | ICD-10-CM | POA: Diagnosis not present

## 2017-08-24 DIAGNOSIS — M1712 Unilateral primary osteoarthritis, left knee: Secondary | ICD-10-CM | POA: Insufficient documentation

## 2017-08-24 DIAGNOSIS — M25512 Pain in left shoulder: Secondary | ICD-10-CM | POA: Insufficient documentation

## 2017-08-24 DIAGNOSIS — M25551 Pain in right hip: Secondary | ICD-10-CM | POA: Diagnosis not present

## 2017-08-24 DIAGNOSIS — E1022 Type 1 diabetes mellitus with diabetic chronic kidney disease: Secondary | ICD-10-CM | POA: Insufficient documentation

## 2017-08-24 MED ORDER — AMITRIPTYLINE HCL 25 MG PO TABS: ORAL_TABLET | ORAL | 1 refills | Status: DC

## 2017-08-24 NOTE — Progress Notes (Signed)
Patient's Name: John Hartman  MRN: [id number]  Referring Provider: Arnetha Courser, MD  DOB: 1971/11/02  PCP: John Courser, MD  DOS: 08/24/2017  Note by: John Santa, MD  Service setting: Ambulatory outpatient  Specialty: Interventional Pain Management  Location: [facility] (AMB) Pain Management Facility  Visit type: Initial Patient Evaluation  Patient type: New Patient   Primary Reason(s) for Visit: Encounter for initial evaluation of one or more chronic problems (new to examiner) potentially causing chronic pain, and posing a threat to normal musculoskeletal function. (Level of risk: High) CC: Back Pain (lower bilateral, degenerative disc disease, spinal stenosis) and Hip Pain (right)  HPI  [name] is a 46 y.o. year old, male patient, who comes today to see [place] for the first time for an initial evaluation of his chronic pain. He has Essential hypertension; Gout; IBS (irritable bowel syndrome); Family history of malignant neoplasm of gastrointestinal tract; Benign neoplasm of rectosigmoid junction; Palpitations; Decongestant abuse; Ketonuria; Glucosuria; Preventative health care; Chest pain; Diabetes mellitus ([place]); Encounter for medication monitoring; Coronary artery disease; Angioedema; Hyperlipidemia LDL goal <70; Elevated serum glutamic pyruvic transaminase (SGPT) level; Elevated liver enzymes; Serum total bilirubin elevated; Fatty liver; Chronic lower back pain; Degenerative disc disease, lumbar; Arthritis, 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 Today he comes in for evaluation of his Back Pain (lower bilateral, degenerative disc disease, spinal stenosis) and Hip Pain (right)   Pain Assessment: Location: Left, Right (bilateral) Back (hips) Radiating: has pain into buttocks and legs when sitting in a hard chair.  Onset: More than a month ago Duration: Chronic pain Quality: Constant, Dull, Aching, Heaviness, Pressure (these  indicators can be followed by worsening s/s depending on activity. ) Severity: 6 /10 (self-reported pain score)  Note: Reported level is compatible with observation.                   Effect on ADL: patient is unable to do the things he used to enjoy such as fishing.  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   Timing: Constant Modifying factors: alternating heat and ice but nothing else helps  Onset and Duration: Gradual and Date of onset: 04/17 Cause of pain: degenerative disc Severity: Getting worse, NAS-11 at its worse: 9/10, NAS-11 at its best: 4/10, NAS-11 now: 4/10 and NAS-11 on the average: 6/10 Timing: Morning, Night, During activity or exercise, After activity or exercise and After a period of immobility Aggravating Factors: Bending, Intercourse (sex), Lifiting, Motion, Prolonged sitting, Prolonged standing, Squatting, Stooping , Twisting, Walking, Walking uphill and Walking downhill Alleviating Factors: Cold packs and Hot packs Associated Problems: Fatigue, Sadness, Weakness, Pain that wakes patient up and Pain that does not allow patient to sleep Quality of Pain: Aching, Annoying, Intermittent, Pressure-like, Shooting, Stabbing and Uncomfortable Previous Examinations or Tests: X-rays and Orthopedic evaluation Previous Treatments: The patient denies treatments  The patient comes into the clinics today for the first time for a chronic pain management evaluation.   Patient is a 46 year old male with a past medical history of hypertension, diabetes, CAD who presents with axial low back pain bilaterally with some radiation to his right hip. Patient has tried physical therapy. Patient also has a history of a anterior cervical discectomy and fusion. Patient also endorses depression. Patient also has a history of irritable bowel syndrome. Patient used to be very active and functional and states that his chronic pain is affecting his mood and outlook on life. No SI.  Today  I took the time to provide the patient  with information regarding my pain practice. The patient was informed that my practice is divided into two sections: an interventional pain management section, as well as a completely separate and distinct medication management section. I explained that I have procedure days for my interventional therapies, and evaluation days for follow-ups and medication management. Because of the amount of documentation required during both, they are kept separated. This means that there is the possibility that he may be scheduled for a procedure on one day, and medication management the next. I have also informed him that because of staffing and facility limitations, I no longer take patients for medication management only. To illustrate the reasons for this, I gave the patient the example of surgeons, and how inappropriate it would be to refer a patient to his/her care, just to write for the post-surgical antibiotics on a surgery done by a different surgeon.   Because interventional pain management is my board-certified specialty, the patient was informed that joining my practice means that they are open to any and all interventional therapies. I made it clear that this does not mean that they will be forced to have any procedures done. What this means is that I believe interventional therapies to be essential part of the diagnosis and proper management of chronic pain conditions. Therefore, patients not interested in these interventional alternatives will be better served under the care of a different practitioner.  The patient was also made aware of my Comprehensive Pain Management Safety Guidelines where by joining my practice, they limit all of their nerve blocks and joint injections to those done by our practice, for as long as we are retained to manage their care.   Historic Controlled Substance Pharmacotherapy Review  PMP and historical list of controlled substances: Tramadol 50 mg, quantity 24, last fill  08/07/2016. Medications: The patient did not bring the medication(s) to the appointment, as requested in our "New Patient Package" Pharmacodynamics: Desired effects: Analgesia: The patient reports >50% benefit. Reported improvement in function: The patient reports medication allows him to accomplish basic ADLs. Clinically meaningful improvement in function (CMIF): Sustained CMIF goals met Perceived effectiveness: Described as relatively effective, allowing for increase in activities of daily living (ADL) Undesirable effects: Side-effects or Adverse reactions: None reported Historical Monitoring: The patient  reports that he does not use drugs. List of all UDS Test(s): No results found for: MDMA, COCAINSCRNUR, PCPSCRNUR, PCPQUANT, CANNABQUANT, THCU, [place] List of all Serum Drug Screening Test(s):  No results found for: AMPHSCRSER, BARBSCRSER, BENZOSCRSER, COCAINSCRSER, PCPSCRSER, PCPQUANT, THCSCRSER, CANNABQUANT, OPIATESCRSER, OXYSCRSER, PROPOXSCRSER Historical Background Evaluation: [place] PDMP: Six (6) year initial data search conducted.             [place] Department of public safety, offender search: Editor, commissioning Information) Non-contributory Risk Assessment Profile: Aberrant behavior: None observed or detected today Risk factors for fatal opioid overdose: None identified today Fatal overdose hazard ratio (HR): Calculation deferred Non-fatal overdose hazard ratio (HR): Calculation deferred Risk of opioid abuse or dependence: 0.7-3.0% with doses ? 36 MME/day and 6.1-26% with doses ? 120 MME/day. Substance use disorder (SUD) risk level: non-opioid med management Opioid risk tool (ORT) (Total Score): 0     Opioid Risk Tool - 08/24/17 0838      Family History of Substance Abuse   Alcohol Negative   Illegal Drugs Negative   Rx Drugs Negative     Personal History of Substance Abuse   Alcohol Negative   Illegal Drugs Negative  Rx Drugs Negative     Psychological Disease   Psychological Disease  Negative   Depression Negative     Total Score   Opioid Risk Tool Scoring 0   Opioid Risk Interpretation Low Risk     ORT Scoring interpretation table:  Score <3 = Low Risk for SUD  Score between 4-7 = Moderate Risk for SUD  Score >8 = High Risk for Opioid Abuse     Pharmacologic Plan: Pending ordered tests and/or consults            Initial impression: The patient indicated having no interest, at this time.Non-opioid therapy  Meds   Current Outpatient Prescriptions:  .  ACCU-CHEK SOFTCLIX LANCETS lancets, USE AS DIRECTED, Disp: 100 each, Rfl: 0 .  allopurinol (ZYLOPRIM) 100 MG tablet, Take 100 mg by mouth every morning. , Disp: , Rfl:  .  belladonna-PHENObarbital (DONNATAL) 16.2 MG/5ML ELIX, Take 5 mLs (16.2 mg total) by mouth 4 (four) times daily as needed for cramping or pain. (Patient taking differently: Take 5 mLs by mouth 4 (four) times daily as needed for cramping or pain. ), Disp: 600 mL, Rfl: 1 .  cetirizine (ZYRTEC) 10 MG tablet, Take 10 mg by mouth at bedtime. , Disp: , Rfl:  .  colchicine 0.6 MG tablet, Take 0.6 mg by mouth daily as needed., Disp: , Rfl:  .  diphenhydrAMINE (BENADRYL) 25 mg capsule, Take 25 mg by mouth at bedtime. , Disp: , Rfl:  .  EPINEPHrine (EPIPEN 2-PAK) 0.3 mg/0.3 mL IJ SOAJ injection, Inject 0.3 mg into the muscle once., Disp: , Rfl:  .  glucose blood (ACCU-CHEK ACTIVE STRIPS) test strip, Use as instructed, Disp: 100 each, Rfl: 12 .  glucose blood (ACCU-CHEK AVIVA PLUS) test strip, Check FSBS three to four times per day, Disp: 100 each, Rfl: 1 .  Insulin Degludec (TRESIBA FLEXTOUCH) 100 UNIT/ML SOPN, Inject 20 Units into the skin at bedtime. , Disp: 1 pen, Rfl: 0 .  Insulin Pen Needle (B-D UF III MINI PEN NEEDLES) 31G X 5 MM MISC, Once a day with insulin, Disp: 50 each, Rfl: 3 .  Melatonin 5 MG TABS, Take 5 mg by mouth at bedtime. , Disp: , Rfl:  .  metoprolol tartrate (LOPRESSOR) 25 MG tablet, TAKE 1 TABLET(25 MG) BY MOUTH TWICE DAILY, Disp: 180  tablet, Rfl: 3 .  naproxen sodium (ANAPROX) 220 MG tablet, Take 1-2 tablets (220-440 mg total) by mouth 2 (two) times daily with a meal., Disp: , Rfl:  .  ranitidine (ZANTAC) 150 MG capsule, Take 150 mg by mouth at bedtime. , Disp: , Rfl:  .  amitriptyline (ELAVIL) 25 MG tablet, 25 mg qhs x 2 weeks, then 50 mg qhs, Disp: 60 tablet, Rfl: 1 .  cyclobenzaprine (FLEXERIL) 10 MG tablet, TAKE 1 TABLET(10 MG) BY MOUTH EVERY 8 HOURS AS NEEDED FOR MUSCLE SPASMS (Patient not taking: Reported on 08/24/2017), Disp: 30 tablet, Rfl: 0 .  metFORMIN (GLUCOPHAGE-XR) 500 MG 24 hr tablet, Take 1,500 mg by mouth every evening. Take with supper., Disp: , Rfl:  .  traMADol (ULTRAM) 50 MG tablet, Take 50 mg by mouth every 8 (eight) hours as needed for moderate pain or severe pain., Disp: , Rfl:   Imaging Review   Results for orders placed in visit on 07/12/07  MR Shoulder Left Wo Contrast   Narrative   PRIOR REPORT IMPORTED FROM THE [employer] WORKFLOW SYSTEM   REASON FOR EXAM:    left shoulder pain  COMMENTS:  PROCEDURE:     MR  - MR SHOULDER LT  WO CONTRAST  - Jul 12 2007  6:18PM   RESULT:     Comparison: No available comparison exam.   Technique: Multiplanar, multisequence MR examination of the left shoulder  was performed without gadolinium contrast.   Findings:   There is type 2 acromion. There are moderate degenerative changes of the  [facility]  joint with some associated subchondral edema. There is mild mass-effect on  the supraspinatus tendon. Small amount of subacromial subdeltoid bursal  fluid is seen.   There is mild anterior supraspinatus tendon thickening and increased  signal,  consistent with mild tendinosis. No definite tear is seen. The  infraspinatus  tendon is unremarkable. The subscapularis tendon is unremarkable. There is  no significant rotator cuff muscle atrophy. The biceps tendon is normal in  location and appearance.   No fracture is noted. Evaluation of the labrum is limited without   contrast.  No gross labral tear is seen. Mild degenerative changes are seen along the  greater tuberosity.   IMPRESSION:   1. Moderate AC joint degenerative changes with associated subchondral  edema.  There is mild mass-effect on the supraspinatus tendon. Small amount of  subacromial subdeltoid bursal fluid is seen.  2. Mild supraspinatus tendinosis.   Thank you for this opportunity to contribute to the care of your patient.          Lumbar DG (Complete) 4+V:  Results for orders placed during the hospital encounter of 03/21/16  DG Lumbar Spine Complete   Narrative CLINICAL DATA:  Lumbago without radicular symptoms  EXAM: LUMBAR SPINE - COMPLETE 4+ VIEW  COMPARISON:  None.  FINDINGS: Frontal, lateral, spot lumbosacral lateral, and bilateral oblique views were obtained. There are 5 non-rib-bearing lumbar type vertebral bodies. There is no fracture or spondylolisthesis. There is mild disc space narrowing at all levels, most notably at L1-2 and L4-5. There are anterior osteophytes at L1, L2, and L3. There is facet arthropathic change at L5-S1 bilaterally. There are scattered foci of calcification in the aorta.  IMPRESSION: Osteoarthritic change at multiple levels. No fracture or spondylolisthesis.   Electronically Signed   By: Lowella Grip III M.D.   On: 03/21/2016 11:22     Knee Imaging: Knee-R MR w contrast: No results found for this or any previous visit. Knee-L MR w contrast:  Results for orders placed in visit on 08/04/14  MR Knee Left  Wo Contrast   Narrative   CLINICAL DATA:  Generalized pain around the LEFT knee joint. Popping  and grinding. Give way pain.   EXAM:  MRI OF THE LEFT KNEE WITHOUT CONTRAST   TECHNIQUE:  Multiplanar, multisequence MR imaging of the knee was performed. No  intravenous contrast was administered.   COMPARISON:  None.   FINDINGS:  MENISCI   Medial meniscus:  Normal.   Lateral meniscus:  Normal.   LIGAMENTS    Cruciates:  Normal.   Collaterals:  Normal.   CARTILAGE   Patellofemoral: Moderate patellofemoral osteoarthritis. Grade III  chondromalacia is present in the lateral patellar facet with  subchondral edema. Trochlear cartilage appears preserved.   Medial:  Minimal osteoarthritis.  No focal cartilage defects.   Lateral:  Normal.   Joint: Small ganglion cyst is present in the anterior intercondylar  notch adjacent to the lateral meniscus anterior horn root. No  effusion.   Popliteal Fossa: Negative for Baker's cyst. Partially visualized  probable varices adjacent to the proximal tibiofibular joint.  Extensor Mechanism:  Intact.   Bones:  No aggressive osseous lesions or fractures.   IMPRESSION:  1. Moderate scrotum moderate chondromalacia patella.  2. Intact cruciate, collateral ligaments and menisci.    Electronically Signed    By: Dereck Ligas M.D.    On: 08/04/2014 15:31         Complexity Note: Imaging results reviewed. Results discussed using Layman's terms.               ROS  Cardiovascular History: Daily Aspirin intake, High blood pressure, Chest pain and Heart catheterization Pulmonary or Respiratory History: Snoring  and Coughing up mucus (Bronchitis) Neurological History: No reported neurological signs or symptoms such as seizures, abnormal skin sensations, urinary and/or fecal incontinence, being born with an abnormal open spine and/or a tethered spinal cord Review of Past Neurological Studies: No results found for this or any previous visit. Psychological-Psychiatric History: No reported psychological or psychiatric signs or symptoms such as difficulty sleeping, anxiety, depression, delusions or hallucinations (schizophrenial), mood swings (bipolar disorders) or suicidal ideations or attempts Gastrointestinal History: Reflux or heatburn and Alternating episodes iof diarrhea and constipation (IBS-Irritable bowe syndrome) Genitourinary History: No  reported renal or genitourinary signs or symptoms such as difficulty voiding or producing urine, peeing blood, non-functioning kidney, kidney stones, difficulty emptying the bladder, difficulty controlling the flow of urine, or chronic kidney disease Hematological History: No reported hematological signs or symptoms such as prolonged bleeding, low or poor functioning platelets, bruising or bleeding easily, hereditary bleeding problems, low energy levels due to low hemoglobin or being anemic Endocrine History: High blood sugar requiring insulin (IDDM) Rheumatologic History: No reported rheumatological signs and symptoms such as fatigue, joint pain, tenderness, swelling, redness, heat, stiffness, decreased range of motion, with or without associated rash Musculoskeletal History: Negative for myasthenia gravis, muscular dystrophy, multiple sclerosis or malignant hyperthermia Work History: Working full time  Allergies  Mr. Warmuth is allergic to lisinopril and ace inhibitors.  Laboratory Chemistry  Inflammation Markers (CRP: Acute Phase) (ESR: Chronic Phase) No results found for: CRP, ESRSEDRATE               Renal Function Markers Lab Results  Component Value Date   BUN 19 08/04/2017   CREATININE 1.20 08/04/2017   GFRAA 85 08/04/2017   GFRNONAA 73 08/04/2017                 Hepatic Function Markers Lab Results  Component Value Date   AST 71 (H) 08/04/2017   ALT 122 (H) 08/04/2017   ALBUMIN 4.9 08/04/2017   ALKPHOS 87 08/04/2017                 Electrolytes Lab Results  Component Value Date   NA 139 08/04/2017   K 4.8 08/04/2017   CL 101 08/04/2017   CALCIUM 10.0 08/04/2017   MG 2.0 08/27/2016                 Neuropathy Markers No results found for: [id number]               Bone Pathology Markers Lab Results  Component Value Date   ALKPHOS 87 08/04/2017   VD25OH 30 08/04/2017   CALCIUM 10.0 08/04/2017                 Coagulation Parameters Lab Results  Component  Value Date   INR 0.96 08/11/2016   LABPROT 12.8 08/11/2016   PLT 195 08/04/2017  Cardiovascular Markers Lab Results  Component Value Date   HGB 17.2 (H) 08/04/2017   HCT 51.1 (H) 08/04/2017                 Note: Lab results reviewed.  PFSH  Drug: Mr. Tony  reports that he does not use drugs. Alcohol:  reports that he does not drink alcohol. Tobacco:  reports that he quit smoking about 2 years ago. His smoking use included Cigarettes. He has a 10.00 pack-year smoking history. He has never used smokeless tobacco. Medical:  has a past medical history of Angioedema; DDD (degenerative disc disease), cervical; Diabetes mellitus ([place]); Fatty liver (03/31/2017); GERD (gastroesophageal reflux disease); Gout; HTN (hypertension); Hypertension; IBS (irritable bowel syndrome); Seasonal allergies; and Spinal stenosis. Family: family history includes Allergies in his son; Alzheimer's disease in his maternal grandmother; Arrhythmia in his mother; Breast cancer in his mother; Cancer in his maternal grandfather and mother; Diabetes in his father; Heart disease in his brother; Hypertension in his father; Skin cancer in his mother.  Past Surgical History:  Procedure Laterality Date  . ACDF with fusion  2007  . CARDIAC CATHETERIZATION Left 08/15/2016   Procedure: Left Heart Cath and Coronary Angiography;  Surgeon: Wellington Hampshire, MD;  Location: [place] [facility];  Service: Cardiovascular;  Laterality: Left;  . COLONOSCOPY WITH PROPOFOL N/A 06/26/2015   Procedure: COLONOSCOPY WITH PROPOFOL;  Surgeon: Lucilla Lame, MD;  Location: [place];  Service: Endoscopy;  Laterality: N/A;  with biopsies  . NASAL SEPTUM SURGERY  2004  . SHOULDER SURGERY Left 2007  . Testicular torsion  1980s  . VASECTOMY     Active Ambulatory Problems    Diagnosis Date Noted  . Essential hypertension 05/28/2015  . Gout 05/28/2015  . IBS (irritable bowel syndrome) 05/28/2015  . Family history of  malignant neoplasm of gastrointestinal tract   . Benign neoplasm of rectosigmoid junction   . Palpitations 08/12/2015  . 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 Ketonuria 07/12/2016  . Glucosuria 07/12/2016  . Preventative health care 08/02/2016  . Chest pain 08/08/2016  . Diabetes mellitus ([place])   . Encounter for medication monitoring 08/25/2016  . Coronary artery disease 08/25/2016  . Angioedema   . Hyperlipidemia LDL goal <70 01/04/2017  . Elevated serum glutamic pyruvic transaminase (SGPT) level 01/04/2017  . Elevated liver enzymes 02/11/2017  . Serum total bilirubin elevated 02/11/2017  . Fatty liver 03/31/2017  . Chronic lower back pain 08/04/2017  . Degenerative disc disease, lumbar 08/04/2017  . 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 Calcification of abdominal aorta (HCC) 08/04/2017  . Elevated hemoglobin ([place] [place] [place]) 08/11/2017   Resolved Ambulatory Problems    Diagnosis Date Noted  . Hypertension   . New onset type 1 diabetes mellitus, uncontrolled ([place]) 07/08/2016  . Unstable angina [facility])    Past Medical History:  Diagnosis Date  . Angioedema   . DDD (degenerative disc disease), cervical   . Diabetes mellitus ([place])   . Fatty liver 03/31/2017  . GERD (gastroesophageal reflux disease)   . Gout   . HTN (hypertension)   . Hypertension   . IBS (irritable bowel syndrome)   . Seasonal allergies   . Spinal stenosis    Constitutional Exam  General appearance: Well nourished, well developed, and well hydrated. In no apparent acute distress Vitals:   08/24/17 0825  BP: (!) 137/99  Pulse: 72  Resp: 16  Temp: 98.5 F (36.9 C)  TempSrc: Oral  SpO2: 100%  Weight: 226 lb (102.5  kg)  Height: '5\' 11"'  (1.803 m)   BMI Assessment: Estimated body mass index is 31.52 kg/m as calculated from the following:   Height as of this encounter: '5\' 11"'  (1.803 m).   Weight as of this encounter: 226 lb (102.5 kg).  BMI interpretation table: BMI level Category Range association with higher  incidence of chronic pain  <18 kg/m2 Underweight   18.5-24.9 kg/m2 Ideal body weight   25-29.9 kg/m2 Overweight Increased incidence by 20%  30-34.9 kg/m2 Obese (Class I) Increased incidence by 68%  35-39.9 kg/m2 Severe obesity (Class II) Increased incidence by 136%  >40 kg/m2 Extreme obesity (Class III) Increased incidence by 254%   BMI Readings from Last 4 Encounters:  08/24/17 31.52 kg/m  08/04/17 31.80 kg/m  09/26/16 30.26 kg/m  09/12/16 30.10 kg/m   Wt Readings from Last 4 Encounters:  08/24/17 226 lb (102.5 kg)  08/04/17 228 lb (103.4 kg)  09/26/16 223 lb 1.6 oz (101.2 kg)  09/12/16 221 lb 14.4 oz (100.7 kg)  Psych/Mental status: Alert, oriented x 3 (person, place, & time)       Eyes: PERLA Respiratory: No evidence of acute respiratory distress  Cervical Spine Area Exam  Skin & Axial Inspection: No masses, redness, edema, swelling, or associated skin lesions Alignment: Symmetrical Functional ROM: Unrestricted ROM      Stability: No instability detected Muscle Tone/Strength: Functionally intact. No obvious neuro-muscular anomalies detected. Sensory (Neurological): Unimpaired Palpation: No palpable anomalies              Upper Extremity (UE) Exam    Side: Right upper extremity  Side: Left upper extremity  Skin & Extremity Inspection: Skin color, temperature, and hair growth are WNL. No peripheral edema or cyanosis. No masses, redness, swelling, asymmetry, or associated skin lesions. No contractures.  Skin & Extremity Inspection: Skin color, temperature, and hair growth are WNL. No peripheral edema or cyanosis. No masses, redness, swelling, asymmetry, or associated skin lesions. No contractures.  Functional ROM: Unrestricted ROM          Functional ROM: Unrestricted ROM          Muscle Tone/Strength: Functionally intact. No obvious neuro-muscular anomalies detected.  Muscle Tone/Strength: Functionally intact. No obvious neuro-muscular anomalies detected.  Sensory  (Neurological): Unimpaired          Sensory (Neurological): Unimpaired          Palpation: No palpable anomalies              Palpation: No palpable anomalies              Specialized Test(s): Deferred         Specialized Test(s): Deferred          Thoracic Spine Area Exam  Skin & Axial Inspection: No masses, redness, or swelling Alignment: Symmetrical Functional ROM: Unrestricted ROM Stability: No instability detected Muscle Tone/Strength: Functionally intact. No obvious neuro-muscular anomalies detected. Sensory (Neurological): Unimpaired Muscle strength & Tone: No palpable anomalies  Lumbar Spine Area Exam  Skin & Axial Inspection: No masses, redness, or swelling Alignment: Symmetrical Functional ROM: Decreased ROM      Stability: No instability detected Muscle Tone/Strength: Functionally intact. No obvious neuro-muscular anomalies detected. Sensory (Neurological): Unimpaired Palpation: Complains of area being tender to palpation overlying lumbar facets       Provocative Tests: Lumbar Hyperextension and rotation test: Positive       Lumbar Lateral bending test: Positive       Patrick's Maneuver: Positive  Gait & Posture Assessment  Ambulation: Unassisted Gait: Relatively normal for age and body habitus Posture: WNL   Lower Extremity Exam    Side: Right lower extremity  Side: Left lower extremity  Skin & Extremity Inspection: Skin color, temperature, and hair growth are WNL. No peripheral edema or cyanosis. No masses, redness, swelling, asymmetry, or associated skin lesions. No contractures.  Skin & Extremity Inspection: Skin color, temperature, and hair growth are WNL. No peripheral edema or cyanosis. No masses, redness, swelling, asymmetry, or associated skin lesions. No contractures.  Functional ROM: Unrestricted ROM          Functional ROM: Unrestricted ROM          Muscle Tone/Strength: Functionally intact. No obvious neuro-muscular anomalies detected.   Muscle Tone/Strength: Functionally intact. No obvious neuro-muscular anomalies detected.  Sensory (Neurological): Unimpaired  Sensory (Neurological): Unimpaired  Palpation: No palpable anomalies  Palpation: No palpable anomalies   Assessment  Primary Diagnosis & Pertinent Problem List: The primary encounter diagnosis was Spondylosis of lumbar region without myelopathy or radiculopathy. Diagnoses of Lumbar facet arthropathy (HCC), Chronic bilateral low back pain without sciatica, S/P cervical spinal fusion, Type 2 diabetes mellitus without complication, with long-term current use of insulin ([place]), Degenerative disc disease, lumbar, and Arthritis, 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[place]) were also pertinent to this visit.  Visit Diagnosis (New problems to examiner): 1. Spondylosis of lumbar region without myelopathy or radiculopathy   2. Lumbar facet arthropathy (HCC)   3. Chronic bilateral low back pain without sciatica   4. S/P cervical spinal fusion   5. Type 2 diabetes mellitus without complication, with long-term current use of insulin ([place])   6. Degenerative disc disease, lumbar   7. Arthritis, 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[place])    Plan of Care (Initial workup plan)  Note: Please be advised that as per protocol, today's visit has been an evaluation only. We have not taken over the patient's controlled substance management.   General Recommendations: The pain condition that the patient suffers from is best treated with a multidisciplinary approach that involves an increase in physical activity to prevent de-conditioning and worsening of the pain cycle, as well as psychological counseling (formal and/or informal) to address the co-morbid psychological affects of pain. Treatment will often involve judicious use of pain medications and interventional procedures to decrease the pain, allowing the patient to participate in the physical activity that will ultimately produce long-lasting pain reductions. The goal of the multidisciplinary  approach is to return the patient to a higher level of overall function and to restore their ability to perform activities of daily living.   46 year old male with a past medical history of hypertension, irritable bowel syndrome, coronary artery disease, type 2 diabetes on insulin who presents with low back pain secondary to lumbar spondylosis without myelopathy, lumbar facet arthropathy. Patient does have pain with facet loading, that radiates to his low back and buttock region but does not extend beyond his knees. Patient straight leg raise test was negative. He also endorses greater pain with lumbar extension and lateral rotation as well as suggesting facet pathology. Patient has tried physical therapy which was not very effective and has tried various medications including tramadol, Flexeril, naproxen which were not effective either. Lumbar spine xray with facet arthropathy and multilevel DDD, most pronounced at L3-S1.  Patient is interested in non-opioid modalities for pain management. We discussed diagnostic lumbar medial branch blocks with goal radiofrequency ablation which the patient is in agreement with. All questions and concerns were answered.  In  regards to his non-opioid analgesics, patient takes Flexeril 10 mg as needed, naproxen twice daily when necessary with meals. We discussed neuropathic agents such as gabapentin and Lyrica along with serotonin and norepinephrine reuptake inhibitors such as Cymbalta which may also help with the patient's depression and mood disorder. We also discussed tricyclic antidepressants including amitriptyline which could be effective for his depression, pain and insomnia.   Plan: -Bilateral diagnostic L3-L5 lumbar medial branch nerve blocks with goal radiofrequency ablation - Start amitriptyline 25 mg daily at bedtime 2 weeks then increase to 50 minute grams daily at bedtime if no side effects. QTc on last EKG was for 435 ms -I did not check a urine  toxicology screen as I do not anticipate prescribing opioids. I hope to manage this patient's pain with interventional modalities and non-opioid-based analgesics  Ordered Lab-work, Procedure(s), Referral(s), & Consult(s): Orders Placed This Encounter  Procedures  . LUMBAR FACET(MEDIAL BRANCH NERVE BLOCK) MBNB   Pharmacotherapy (current): Medications ordered:  Meds ordered this encounter  Medications  . amitriptyline (ELAVIL) 25 MG tablet    Sig: 25 mg qhs x 2 weeks, then 50 mg qhs    Dispense:  60 tablet    Refill:  1   Medications administered during this visit: Mr. Eifler had no medications administered during this visit.   Pharmacological management options:  Opioid Analgesics: Non-opioid management only At this time. Will consider tramadol or Nucynta if interventional options in non-opioid analgesics are not effective.    Membrane stabilizer: To be determined at a later time consider gabapentin, Lyrica   Muscle relaxant: To be determined at a later time  has tried Flexeril, can consider tizanidine, baclofen, Robaxin  NSAID: To be determined at a later time is tried naproxen in the past. Can consider Mobic, diclofenac   Other analgesic(s): To be determined at a later time TENS, Cymbalta, topicals, TCAs    Interventional management options: Mr. Theiss was informed that there is no guarantee that he would be a candidate for interventional therapies. The decision will be based on the results of diagnostic studies, as well as Mr. Patlan risk profile.  Procedure(s) under consideration:  -Bilateral diagnostic lumbar medial branch nerve blocks -Sacroiliac joint injection -Trigger point injections -Lumbar epidural steroid injection   Provider-requested follow-up: No Follow-up on file.  Future Appointments Date Time Provider [place]  09/21/2017 2:45 PM Lucilla Lame, MD [facility] None  08/08/2018 11:00 AM Lada, Satira Anis, MD [place] None    Primary Care Physician: John Courser, MD Location: [facility] Note by: John Hartman, M.D, Date: 08/24/2017; Time: 10:59 AM  Patient Instructions   It was nice meeting you today. Today we discussed the following: #1. We'll schedule you for bilateral lumbar medial branch nerve blocks as we discussed. #2. Prescription for amitriptyline. Start 25 mg in the evening for 2 weeks. If no side effects can increase to 50 might grams in the evening. #3. Follow-up with me for procedure.  GENERAL RISKS AND COMPLICATIONS  What are the risk, side effects and possible complications? Generally speaking, most procedures are safe.  However, with any procedure there are risks, side effects, and the possibility of complications.  The risks and complications are dependent upon the sites that are lesioned, or the type of nerve block to be performed.  The closer the procedure is to the spine, the more serious the risks are.  Great care is taken when placing the radio frequency needles, block needles or lesioning probes, but  sometimes complications can occur. 1. Infection: Any time there is an injection through the skin, there is a risk of infection.  This is why sterile conditions are used for these blocks.  There are four possible types of infection. 1. Localized skin infection. 2. Central Nervous System Infection-This can be in the form of Meningitis, which can be deadly. 3. Epidural Infections-This can be in the form of an epidural abscess, which can cause pressure inside of the spine, causing compression of the spinal cord with subsequent paralysis. This would require an emergency surgery to decompress, and there are no guarantees that the patient would recover from the paralysis. 4. Discitis-This is an infection of the intervertebral discs.  It occurs in about 1% of discography procedures.  It is difficult to treat and it may lead to surgery.        2. Pain: the needles have to go through skin and soft tissues,  will cause soreness.       3. Damage to internal structures:  The nerves to be lesioned may be near blood vessels or    other nerves which can be potentially damaged.       4. Bleeding: Bleeding is more common if the patient is taking blood thinners such as  aspirin, Coumadin, Ticiid, Plavix, etc., or if he/she have some genetic predisposition  such as hemophilia. Bleeding into the spinal canal can cause compression of the spinal  cord with subsequent paralysis.  This would require an emergency surgery to  decompress and there are no guarantees that the patient would recover from the  paralysis.       5. Pneumothorax:  Puncturing of a lung is a possibility, every time a needle is introduced in  the area of the chest or upper back.  Pneumothorax refers to free air around the  collapsed lung(s), inside of the thoracic cavity (chest cavity).  Another two possible  complications related to a similar event would include: Hemothorax and Chylothorax.   These are variations of the Pneumothorax, where instead of air around the collapsed  lung(s), you may have blood or chyle, respectively.       6. Spinal headaches: They may occur with any procedures in the area of the spine.       7. Persistent CSF (Cerebro-Spinal Fluid) leakage: This is a rare problem, but may occur  with prolonged intrathecal or epidural catheters either due to the formation of a fistulous  track or a dural tear.       8. Nerve damage: By working so close to the spinal cord, there is always a possibility of  nerve damage, which could be as serious as a permanent spinal cord injury with  paralysis.       9. Death:  Although rare, severe deadly allergic reactions known as "Anaphylactic  reaction" can occur to any of the medications used.      10. Worsening of the symptoms:  We can always make thing worse.  What are the chances of something like this happening? Chances of any of this occuring are extremely low.  By statistics, you have more of a  chance of getting killed in a motor vehicle accident: while driving to the hospital than any of the above occurring .  Nevertheless, you should be aware that they are possibilities.  In general, it is similar to taking a shower.  Everybody knows that you can slip, hit your head and get killed.  Does that mean that you should  not shower again?  Nevertheless always keep in mind that statistics do not mean anything if you happen to be on the wrong side of them.  Even if a procedure has a 1 (one) in a 1,000,000 (million) chance of going wrong, it you happen to be that one..Also, keep in mind that by statistics, you have more of a chance of having something go wrong when taking medications.  Who should not have this procedure? If you are on a blood thinning medication (e.g. Coumadin, Plavix, see list of "Blood Thinners"), or if you have an active infection going on, you should not have the procedure.  If you are taking any blood thinners, please inform your physician.  How should I prepare for this procedure?  Do not eat or drink anything at least six hours prior to the procedure.  Bring a driver with you .  It cannot be a taxi.  Come accompanied by an adult that can drive you back, and that is strong enough to help you if your legs get weak or numb from the local anesthetic.  Take all of your medicines the morning of the procedure with just enough water to swallow them.  If you have diabetes, make sure that you are scheduled to have your procedure done first thing in the morning, whenever possible.  If you have diabetes, take only half of your insulin dose and notify our nurse that you have done so as soon as you arrive at the clinic.  If you are diabetic, but only take blood sugar pills (oral hypoglycemic), then do not take them on the morning of your procedure.  You may take them after you have had the procedure.  Do not take aspirin or any aspirin-containing medications, at least eleven (11) days  prior to the procedure.  They may prolong bleeding.  Wear loose fitting clothing that may be easy to take off and that you would not mind if it got stained with Betadine or blood.  Do not wear any jewelry or perfume  Remove any nail coloring.  It will interfere with some of our monitoring equipment.  NOTE: Remember that this is not meant to be interpreted as a complete list of all possible complications.  Unforeseen problems may occur.  BLOOD THINNERS The following drugs contain aspirin or other products, which can cause increased bleeding during surgery and should not be taken for 2 weeks prior to and 1 week after surgery.  If you should need take something for relief of minor pain, you may take acetaminophen which is found in Tylenol,m Datril, Anacin-3 and Panadol. It is not blood thinner. The products listed below are.  Do not take any of the products listed below in addition to any listed on your instruction sheet.  A.P.C or A.P.C with Codeine Codeine Phosphate Capsules #3 Ibuprofen Ridaura  ABC compound Congesprin Imuran rimadil  Advil Cope Indocin Robaxisal  Alka-Seltzer Effervescent Pain Reliever and Antacid Coricidin or Coricidin-D  Indomethacin Rufen  Alka-Seltzer plus Cold Medicine Cosprin Ketoprofen S-A-C Tablets  Anacin Analgesic Tablets or Capsules Coumadin Korlgesic Salflex  Anacin Extra Strength Analgesic tablets or capsules CP-2 Tablets Lanoril Salicylate  Anaprox Cuprimine Capsules Levenox Salocol  Anexsia-D Dalteparin Magan Salsalate  Anodynos Darvon compound Magnesium Salicylate Sine-off  Ansaid Dasin Capsules Magsal Sodium Salicylate  Anturane Depen Capsules Marnal Soma  APF Arthritis pain formula Dewitt's Pills Measurin Stanback  Argesic Dia-Gesic Meclofenamic Sulfinpyrazone  Arthritis Bayer Timed Release Aspirin Diclofenac Meclomen Sulindac  Arthritis pain formula Anacin  Dicumarol Medipren Supac  Analgesic (Safety coated) Arthralgen Diffunasal Mefanamic Suprofen   Arthritis Strength Bufferin Dihydrocodeine Mepro Compound Suprol  Arthropan liquid Dopirydamole Methcarbomol with Aspirin Synalgos  ASA tablets/Enseals Disalcid Micrainin Tagament  Ascriptin Doan's Midol Talwin  Ascriptin A/D Dolene Mobidin Tanderil  Ascriptin Extra Strength Dolobid Moblgesic Ticlid  Ascriptin with Codeine Doloprin or Doloprin with Codeine Momentum Tolectin  Asperbuf Duoprin Mono-gesic Trendar  Aspergum Duradyne Motrin or Motrin IB Triminicin  Aspirin plain, buffered or enteric coated Durasal Myochrisine Trigesic  Aspirin Suppositories Easprin Nalfon Trillsate  Aspirin with Codeine Ecotrin Regular or Extra Strength Naprosyn Uracel  Atromid-S Efficin Naproxen Ursinus  Auranofin Capsules Elmiron Neocylate Vanquish  Axotal Emagrin Norgesic Verin  Azathioprine Empirin or Empirin with Codeine Normiflo Vitamin E  Azolid Emprazil Nuprin Voltaren  Bayer Aspirin plain, buffered or children's or timed BC Tablets or powders Encaprin Orgaran Warfarin Sodium  Buff-a-Comp Enoxaparin Orudis Zorpin  Buff-a-Comp with Codeine Equegesic Os-Cal-Gesic   Buffaprin Excedrin plain, buffered or Extra Strength Oxalid   Bufferin Arthritis Strength Feldene Oxphenbutazone   Bufferin plain or Extra Strength Feldene Capsules Oxycodone with Aspirin   Bufferin with Codeine Fenoprofen Fenoprofen Pabalate or Pabalate-SF   Buffets II Flogesic Panagesic   Buffinol plain or Extra Strength Florinal or Florinal with Codeine Panwarfarin   Buf-Tabs Flurbiprofen Penicillamine   Butalbital Compound Four-way cold tablets Penicillin   Butazolidin Fragmin Pepto-Bismol   Carbenicillin Geminisyn Percodan   Carna Arthritis Reliever Geopen Persantine   Carprofen Gold's salt Persistin   Chloramphenicol Goody's Phenylbutazone   Chloromycetin Haltrain Piroxlcam   Clmetidine heparin Plaquenil   Cllnoril Hyco-pap Ponstel   Clofibrate Hydroxy chloroquine Propoxyphen         Before stopping any of these medications,  be sure to consult the physician who ordered them.  Some, such as Coumadin (Warfarin) are ordered to prevent or treat serious conditions such as "deep thrombosis", "pumonary embolisms", and other heart problems.  The amount of time that you may need off of the medication may also vary with the medication and the reason for which you were taking it.  If you are taking any of these medications, please make sure you notify your pain physician before you undergo any procedures.         Facet Blocks Patient Information  Description: The facets are joints in the spine between the vertebrae.  Like any joints in the body, facets can become irritated and painful.  Arthritis can also effect the facets.  By injecting steroids and local anesthetic in and around these joints, we can temporarily block the nerve supply to them.  Steroids act directly on irritated nerves and tissues to reduce selling and inflammation which often leads to decreased pain.  Facet blocks may be done anywhere along the spine from the neck to the low back depending upon the location of your pain.   After numbing the skin with local anesthetic (like Novocaine), a small needle is passed onto the facet joints under x-ray guidance.  You may experience a sensation of pressure while this is being done.  The entire block usually lasts about 15-25 minutes.   Conditions which may be treated by facet blocks:   Low back/buttock pain  Neck/shoulder pain  Certain types of headaches  Preparation for the injection:  1. Do not eat any solid food or dairy products within 8 hours of your appointment. 2. You may drink clear liquid up to 3 hours before appointment.  Clear liquids include water, black coffee, juice or  soda.  No milk or cream please. 3. You may take your regular medication, including pain medications, with a sip of water before your appointment.  Diabetics should hold regular insulin (if taken separately) and take 1/2 normal NPH  dose the morning of the procedure.  Carry some sugar containing items with you to your appointment. 4. A driver must accompany you and be prepared to drive you home after your procedure. 5. Bring all your current medications with you. 6. An IV may be inserted and sedation may be given at the discretion of the physician. 7. A blood pressure cuff, EKG and other monitors will often be applied during the procedure.  Some patients may need to have extra oxygen administered for a short period. 8. You will be asked to provide medical information, including your allergies and medications, prior to the procedure.  We must know immediately if you are taking blood thinners (like Coumadin/Warfarin) or if you are allergic to IV iodine contrast (dye).  We must know if you could possible be pregnant.  Possible side-effects:   Bleeding from needle site  Infection (rare, may require surgery)  Nerve injury (rare)  Numbness & tingling (temporary)  Difficulty urinating (rare, temporary)  Spinal headache (a headache worse with upright posture)  Light-headedness (temporary)  Pain at injection site (serveral days)  Decreased blood pressure (rare, temporary)  Weakness in arm/leg (temporary)  Pressure sensation in back/neck (temporary)   Call if you experience:   Fever/chills associated with headache or increased back/neck pain  Headache worsened by an upright position  New onset, weakness or numbness of an extremity below the injection site  Hives or difficulty breathing (go to the emergency room)  Inflammation or drainage at the injection site(s)  Severe back/neck pain greater than usual  New symptoms which are concerning to you  Please note:  Although the local anesthetic injected can often make your back or neck feel good for several hours after the injection, the pain will likely return. It takes 3-7 days for steroids to work.  You may not notice any pain relief for at least one  week.  If effective, we will often do a series of 2-3 injections spaced 3-6 weeks apart to maximally decrease your pain.  After the initial series, you may be a candidate for a more permanent nerve block of the facets.  If you have any questions, please call #[phone]) Home Clinic

## 2017-08-24 NOTE — Patient Instructions (Addendum)
It was nice meeting you today. Today we discussed the following: #1. We'll schedule you for bilateral lumbar medial branch nerve blocks as we discussed. #2. Prescription for amitriptyline. Start 25 mg in the evening for 2 weeks. If no side effects can increase to 50 might grams in the evening. #3. Follow-up with me for procedure.  GENERAL RISKS AND COMPLICATIONS  What are the risk, side effects and possible complications? Generally speaking, most procedures are safe.  However, with any procedure there are risks, side effects, and the possibility of complications.  The risks and complications are dependent upon the sites that are lesioned, or the type of nerve block to be performed.  The closer the procedure is to the spine, the more serious the risks are.  Great care is taken when placing the radio frequency needles, block needles or lesioning probes, but sometimes complications can occur. 1. Infection: Any time there is an injection through the skin, there is a risk of infection.  This is why sterile conditions are used for these blocks.  There are four possible types of infection. 1. Localized skin infection. 2. Central Nervous System Infection-This can be in the form of Meningitis, which can be deadly. 3. Epidural Infections-This can be in the form of an epidural abscess, which can cause pressure inside of the spine, causing compression of the spinal cord with subsequent paralysis. This would require an emergency surgery to decompress, and there are no guarantees that the patient would recover from the paralysis. 4. Discitis-This is an infection of the intervertebral discs.  It occurs in about 1% of discography procedures.  It is difficult to treat and it may lead to surgery.        2. Pain: the needles have to go through skin and soft tissues, will cause soreness.       3. Damage to internal structures:  The nerves to be lesioned may be near blood vessels or    other nerves which can be  potentially damaged.       4. Bleeding: Bleeding is more common if the patient is taking blood thinners such as  aspirin, Coumadin, Ticiid, Plavix, etc., or if he/she have some genetic predisposition  such as hemophilia. Bleeding into the spinal canal can cause compression of the spinal  cord with subsequent paralysis.  This would require an emergency surgery to  decompress and there are no guarantees that the patient would recover from the  paralysis.       5. Pneumothorax:  Puncturing of a lung is a possibility, every time a needle is introduced in  the area of the chest or upper back.  Pneumothorax refers to free air around the  collapsed lung(s), inside of the thoracic cavity (chest cavity).  Another two possible  complications related to a similar event would include: Hemothorax and Chylothorax.   These are variations of the Pneumothorax, where instead of air around the collapsed  lung(s), you may have blood or chyle, respectively.       6. Spinal headaches: They may occur with any procedures in the area of the spine.       7. Persistent CSF (Cerebro-Spinal Fluid) leakage: This is a rare problem, but may occur  with prolonged intrathecal or epidural catheters either due to the formation of a fistulous  track or a dural tear.       8. Nerve damage: By working so close to the spinal cord, there is always a possibility of  nerve damage, which  could be as serious as a permanent spinal cord injury with  paralysis.       9. Death:  Although rare, severe deadly allergic reactions known as "Anaphylactic  reaction" can occur to any of the medications used.      10. Worsening of the symptoms:  We can always make thing worse.  What are the chances of something like this happening? Chances of any of this occuring are extremely low.  By statistics, you have more of a chance of getting killed in a motor vehicle accident: while driving to the hospital than any of the above occurring .  Nevertheless, you should be  aware that they are possibilities.  In general, it is similar to taking a shower.  Everybody knows that you can slip, hit your head and get killed.  Does that mean that you should not shower again?  Nevertheless always keep in mind that statistics do not mean anything if you happen to be on the wrong side of them.  Even if a procedure has a 1 (one) in a 1,000,000 (million) chance of going wrong, it you happen to be that one..Also, keep in mind that by statistics, you have more of a chance of having something go wrong when taking medications.  Who should not have this procedure? If you are on a blood thinning medication (e.g. Coumadin, Plavix, see list of "Blood Thinners"), or if you have an active infection going on, you should not have the procedure.  If you are taking any blood thinners, please inform your physician.  How should I prepare for this procedure?  Do not eat or drink anything at least six hours prior to the procedure.  Bring a driver with you .  It cannot be a taxi.  Come accompanied by an adult that can drive you back, and that is strong enough to help you if your legs get weak or numb from the local anesthetic.  Take all of your medicines the morning of the procedure with just enough water to swallow them.  If you have diabetes, make sure that you are scheduled to have your procedure done first thing in the morning, whenever possible.  If you have diabetes, take only half of your insulin dose and notify our nurse that you have done so as soon as you arrive at the clinic.  If you are diabetic, but only take blood sugar pills (oral hypoglycemic), then do not take them on the morning of your procedure.  You may take them after you have had the procedure.  Do not take aspirin or any aspirin-containing medications, at least eleven (11) days prior to the procedure.  They may prolong bleeding.  Wear loose fitting clothing that may be easy to take off and that you would not mind if it  got stained with Betadine or blood.  Do not wear any jewelry or perfume  Remove any nail coloring.  It will interfere with some of our monitoring equipment.  NOTE: Remember that this is not meant to be interpreted as a complete list of all possible complications.  Unforeseen problems may occur.  BLOOD THINNERS The following drugs contain aspirin or other products, which can cause increased bleeding during surgery and should not be taken for 2 weeks prior to and 1 week after surgery.  If you should need take something for relief of minor pain, you may take acetaminophen which is found in Tylenol,m Datril, Anacin-3 and Panadol. It is not blood thinner. The products  listed below are.  Do not take any of the products listed below in addition to any listed on your instruction sheet.  A.P.C or A.P.C with Codeine Codeine Phosphate Capsules #3 Ibuprofen Ridaura  ABC compound Congesprin Imuran rimadil  Advil Cope Indocin Robaxisal  Alka-Seltzer Effervescent Pain Reliever and Antacid Coricidin or Coricidin-D  Indomethacin Rufen  Alka-Seltzer plus Cold Medicine Cosprin Ketoprofen S-A-C Tablets  Anacin Analgesic Tablets or Capsules Coumadin Korlgesic Salflex  Anacin Extra Strength Analgesic tablets or capsules CP-2 Tablets Lanoril Salicylate  Anaprox Cuprimine Capsules Levenox Salocol  Anexsia-D Dalteparin Magan Salsalate  Anodynos Darvon compound Magnesium Salicylate Sine-off  Ansaid Dasin Capsules Magsal Sodium Salicylate  Anturane Depen Capsules Marnal Soma  APF Arthritis pain formula Dewitt's Pills Measurin Stanback  Argesic Dia-Gesic Meclofenamic Sulfinpyrazone  Arthritis Bayer Timed Release Aspirin Diclofenac Meclomen Sulindac  Arthritis pain formula Anacin Dicumarol Medipren Supac  Analgesic (Safety coated) Arthralgen Diffunasal Mefanamic Suprofen  Arthritis Strength Bufferin Dihydrocodeine Mepro Compound Suprol  Arthropan liquid Dopirydamole Methcarbomol with Aspirin Synalgos  ASA  tablets/Enseals Disalcid Micrainin Tagament  Ascriptin Doan's Midol Talwin  Ascriptin A/D Dolene Mobidin Tanderil  Ascriptin Extra Strength Dolobid Moblgesic Ticlid  Ascriptin with Codeine Doloprin or Doloprin with Codeine Momentum Tolectin  Asperbuf Duoprin Mono-gesic Trendar  Aspergum Duradyne Motrin or Motrin IB Triminicin  Aspirin plain, buffered or enteric coated Durasal Myochrisine Trigesic  Aspirin Suppositories Easprin Nalfon Trillsate  Aspirin with Codeine Ecotrin Regular or Extra Strength Naprosyn Uracel  Atromid-S Efficin Naproxen Ursinus  Auranofin Capsules Elmiron Neocylate Vanquish  Axotal Emagrin Norgesic Verin  Azathioprine Empirin or Empirin with Codeine Normiflo Vitamin E  Azolid Emprazil Nuprin Voltaren  Bayer Aspirin plain, buffered or children's or timed BC Tablets or powders Encaprin Orgaran Warfarin Sodium  Buff-a-Comp Enoxaparin Orudis Zorpin  Buff-a-Comp with Codeine Equegesic Os-Cal-Gesic   Buffaprin Excedrin plain, buffered or Extra Strength Oxalid   Bufferin Arthritis Strength Feldene Oxphenbutazone   Bufferin plain or Extra Strength Feldene Capsules Oxycodone with Aspirin   Bufferin with Codeine Fenoprofen Fenoprofen Pabalate or Pabalate-SF   Buffets II Flogesic Panagesic   Buffinol plain or Extra Strength Florinal or Florinal with Codeine Panwarfarin   Buf-Tabs Flurbiprofen Penicillamine   Butalbital Compound Four-way cold tablets Penicillin   Butazolidin Fragmin Pepto-Bismol   Carbenicillin Geminisyn Percodan   Carna Arthritis Reliever Geopen Persantine   Carprofen Gold's salt Persistin   Chloramphenicol Goody's Phenylbutazone   Chloromycetin Haltrain Piroxlcam   Clmetidine heparin Plaquenil   Cllnoril Hyco-pap Ponstel   Clofibrate Hydroxy chloroquine Propoxyphen         Before stopping any of these medications, be sure to consult the physician who ordered them.  Some, such as Coumadin (Warfarin) are ordered to prevent or treat serious conditions  such as "deep thrombosis", "pumonary embolisms", and other heart problems.  The amount of time that you may need off of the medication may also vary with the medication and the reason for which you were taking it.  If you are taking any of these medications, please make sure you notify your pain physician before you undergo any procedures.         Facet Blocks Patient Information  Description: The facets are joints in the spine between the vertebrae.  Like any joints in the body, facets can become irritated and painful.  Arthritis can also effect the facets.  By injecting steroids and local anesthetic in and around these joints, we can temporarily block the nerve supply to them.  Steroids act directly on irritated nerves and  tissues to reduce selling and inflammation which often leads to decreased pain.  Facet blocks may be done anywhere along the spine from the neck to the low back depending upon the location of your pain.   After numbing the skin with local anesthetic (like Novocaine), a small needle is passed onto the facet joints under x-ray guidance.  You may experience a sensation of pressure while this is being done.  The entire block usually lasts about 15-25 minutes.   Conditions which may be treated by facet blocks:   Low back/buttock pain  Neck/shoulder pain  Certain types of headaches  Preparation for the injection:  1. Do not eat any solid food or dairy products within 8 hours of your appointment. 2. You may drink clear liquid up to 3 hours before appointment.  Clear liquids include water, black coffee, juice or soda.  No milk or cream please. 3. You may take your regular medication, including pain medications, with a sip of water before your appointment.  Diabetics should hold regular insulin (if taken separately) and take 1/2 normal NPH dose the morning of the procedure.  Carry some sugar containing items with you to your appointment. 4. A driver must accompany you and be  prepared to drive you home after your procedure. 5. Bring all your current medications with you. 6. An IV may be inserted and sedation may be given at the discretion of the physician. 7. A blood pressure cuff, EKG and other monitors will often be applied during the procedure.  Some patients may need to have extra oxygen administered for a short period. 8. You will be asked to provide medical information, including your allergies and medications, prior to the procedure.  We must know immediately if you are taking blood thinners (like Coumadin/Warfarin) or if you are allergic to IV iodine contrast (dye).  We must know if you could possible be pregnant.  Possible side-effects:   Bleeding from needle site  Infection (rare, may require surgery)  Nerve injury (rare)  Numbness & tingling (temporary)  Difficulty urinating (rare, temporary)  Spinal headache (a headache worse with upright posture)  Light-headedness (temporary)  Pain at injection site (serveral days)  Decreased blood pressure (rare, temporary)  Weakness in arm/leg (temporary)  Pressure sensation in back/neck (temporary)   Call if you experience:   Fever/chills associated with headache or increased back/neck pain  Headache worsened by an upright position  New onset, weakness or numbness of an extremity below the injection site  Hives or difficulty breathing (go to the emergency room)  Inflammation or drainage at the injection site(s)  Severe back/neck pain greater than usual  New symptoms which are concerning to you  Please note:  Although the local anesthetic injected can often make your back or neck feel good for several hours after the injection, the pain will likely return. It takes 3-7 days for steroids to work.  You may not notice any pain relief for at least one week.  If effective, we will often do a series of 2-3 injections spaced 3-6 weeks apart to maximally decrease your pain.  After the initial  series, you may be a candidate for a more permanent nerve block of the facets.  If you have any questions, please call #[phone]) [facility]

## 2017-08-24 NOTE — Progress Notes (Signed)
Safety precautions to be maintained throughout the outpatient stay will include: orient to surroundings, keep bed in low position, maintain call bell within reach at all times, provide assistance with transfer out of bed and ambulation.  

## 2017-09-13 ENCOUNTER — Encounter: Payer: Self-pay | Admitting: Student in an Organized Health Care Education/Training Program

## 2017-09-13 ENCOUNTER — Ambulatory Visit (HOSPITAL_BASED_OUTPATIENT_CLINIC_OR_DEPARTMENT_OTHER): Payer: 59 | Admitting: Student in an Organized Health Care Education/Training Program

## 2017-09-13 ENCOUNTER — Ambulatory Visit
Admission: RE | Admit: 2017-09-13 | Discharge: 2017-09-13 | Disposition: A | Discharge status: Home / Self Care | Payer: 59 | Source: Ambulatory Visit | Attending: Student in an Organized Health Care Education/Training Program | Admitting: Student in an Organized Health Care Education/Training Program

## 2017-09-13 DIAGNOSIS — M47816 Spondylosis without myelopathy or radiculopathy, lumbar region: Secondary | ICD-10-CM | POA: Diagnosis not present

## 2017-09-13 MED ORDER — LIDOCAINE HCL (PF) 1 % IJ SOLN
10.0000 mL | Freq: Once | INTRAMUSCULAR | Status: AC
  Administered 2017-09-13: 5 mL
  Filled 2017-09-13: qty 10

## 2017-09-13 MED ORDER — FENTANYL CITRATE (PF) 100 MCG/2ML IJ SOLN
25.0000 ug | INTRAMUSCULAR | Status: DC | PRN
  Administered 2017-09-13: 75 ug via INTRAVENOUS
  Filled 2017-09-13: qty 2

## 2017-09-13 MED ORDER — LACTATED RINGERS IV SOLN
1000.0000 mL | Freq: Once | INTRAVENOUS | Status: AC
  Administered 2017-09-13: 1000 mL via INTRAVENOUS

## 2017-09-13 MED ORDER — ROPIVACAINE HCL 2 MG/ML IJ SOLN
10.0000 mL | Freq: Once | INTRAMUSCULAR | Status: AC
  Administered 2017-09-13: 10 mL
  Filled 2017-09-13: qty 10

## 2017-09-13 NOTE — Progress Notes (Signed)
Patient's Name: John Hartman  MRN: [id number]  Referring Provider: Gillis Santa, MD  DOB: 1971/01/10  PCP: Arnetha Courser, MD  DOS: 09/13/2017  Note by: Gillis Santa, MD  Service setting: Ambulatory outpatient  Specialty: Interventional Pain Management  Patient type: Established  Location: [facility] (AMB) Pain Management Facility  Visit type: Interventional Procedure   Primary Reason for Visit: Interventional Pain Management Treatment. CC: Back Pain (low) and Hip Pain (bilateral)  Procedure:  Anesthesia, Analgesia, Anxiolysis:  Type: Diagnostic Medial Branch Facet Block Region: Lumbar Level:  L3, L4, L5, Medial Branch Level(s) Laterality: Bilateral  Type: Local Anesthesia with Moderate (Conscious) Sedation Local Anesthetic: Lidocaine 1% Route: Intravenous (IV) IV Access: Secured Sedation: Meaningful verbal contact was maintained at all times during the procedure  Indication(s): Analgesia and Anxiety  Indications: 1. Spondylosis of lumbar region without myelopathy or radiculopathy    Pain Score: Pre-procedure: 5 /10 Post-procedure: 0-No pain/10  Pre-op Assessment:  John Hartman is a 46 y.o. (year old), male patient, seen today for interventional treatment. He  has a past surgical history that includes Shoulder surgery (Left, 2007); Nasal septum surgery (2004); ACDF with fusion (2007); Testicular torsion (1980s); Vasectomy; Colonoscopy with propofol (N/A, 06/26/2015); and Cardiac catheterization (Left, 08/15/2016). John Hartman has a current medication list which includes the following prescription(s): accu-chek softclix lancets, allopurinol, amitriptyline, belladonna-phenobarbital, cetirizine, colchicine, cyclobenzaprine, diphenhydramine, epinephrine, glucose blood, glucose blood, insulin degludec, insulin pen needle, melatonin, metoprolol tartrate, ranitidine, metformin, naproxen sodium, and tramadol, and the following Facility-Administered Medications: fentanyl. His primarily concern today is the Back  Pain (low) and Hip Pain (bilateral)  46 year old male with a past medical history of hypertension, irritable bowel syndrome, coronary artery disease, type 2 diabetes on insulin who presents with low back pain secondary to lumbar spondylosis without myelopathy, lumbar facet arthropathy. Patient does have pain with facet loading, that radiates to his low back and buttock region but does not extend beyond his knees. Patient straight leg raise test was negative. He also endorses greater pain with lumbar extension and lateral rotation as well as suggesting facet pathology. Patient has tried physical therapy which was not very effective and has tried various medications including tramadol, Flexeril, naproxen which were not effective either. Lumbar spine xray with facet arthropathy and multilevel DDD, most pronounced at L3-S1.  Here for bilateral diagnostic lumbar facets. L3-L5.  Initial Vital Signs: Blood pressure (!) 160/101, pulse 79, temperature 97.8 F (36.6 C), temperature source Oral, resp. rate 18, height 5\' 11"  (1.803 m), weight 225 lb (102.1 kg), SpO2 99 %. BMI: Estimated body mass index is 31.38 kg/m as calculated from the following:   Height as of this encounter: 5\' 11"  (1.803 m).   Weight as of this encounter: 225 lb (102.1 kg).  Risk Assessment: Allergies: Reviewed. He is allergic to lisinopril and ace inhibitors.  Allergy Precautions: None required Coagulopathies: Reviewed. None identified.  Blood-thinner therapy: None at this time Active Infection(s): Reviewed. None identified. John Hartman is afebrile  Site Confirmation: John Hartman was asked to confirm the procedure and laterality before marking the site Procedure checklist: Completed Consent: Before the procedure and under the influence of no sedative(s), amnesic(s), or anxiolytics, the patient was informed of the treatment options, risks and possible complications. To fulfill our ethical and legal obligations, as recommended by the  American Medical Association's Code of Ethics, I have informed the patient of my clinical impression; the nature and purpose of the treatment or procedure; the risks, benefits, and possible complications of the intervention; the alternatives,  including doing nothing; the risk(s) and benefit(s) of the alternative treatment(s) or procedure(s); and the risk(s) and benefit(s) of doing nothing. The patient was provided information about the general risks and possible complications associated with the procedure. These may include, but are not limited to: failure to achieve desired goals, infection, bleeding, organ or nerve damage, allergic reactions, paralysis, and death. In addition, the patient was informed of those risks and complications associated to Spine-related procedures, such as failure to decrease pain; infection (i.e.: Meningitis, epidural or intraspinal abscess); bleeding (i.e.: epidural hematoma, subarachnoid hemorrhage, or any other type of intraspinal or peri-dural bleeding); organ or nerve damage (i.e.: Any type of peripheral nerve, nerve root, or spinal cord injury) with subsequent damage to sensory, motor, and/or autonomic systems, resulting in permanent pain, numbness, and/or weakness of one or several areas of the body; allergic reactions; (i.e.: anaphylactic reaction); and/or death. Furthermore, the patient was informed of those risks and complications associated with the medications. These include, but are not limited to: allergic reactions (i.e.: anaphylactic or anaphylactoid reaction(s)); adrenal axis suppression; blood sugar elevation that in diabetics may result in ketoacidosis or comma; water retention that in patients with history of congestive heart failure may result in shortness of breath, pulmonary edema, and decompensation with resultant heart failure; weight gain; swelling or edema; medication-induced neural toxicity; particulate matter embolism and blood vessel occlusion with  resultant organ, and/or nervous system infarction; and/or aseptic necrosis of one or more joints. Finally, the patient was informed that Medicine is not an exact science; therefore, there is also the possibility of unforeseen or unpredictable risks and/or possible complications that may result in a catastrophic outcome. The patient indicated having understood very clearly. We have given the patient no guarantees and we have made no promises. Enough time was given to the patient to ask questions, all of which were answered to the patient's satisfaction. Mr. Hayashida has indicated that he wanted to continue with the procedure. Attestation: I, the ordering provider, attest that I have discussed with the patient the benefits, risks, side-effects, alternatives, likelihood of achieving goals, and potential problems during recovery for the procedure that I have provided informed consent. Date: 09/13/2017; Time: 8:11 AM  Pre-Procedure Preparation:  Monitoring: As per clinic protocol. Respiration, ETCO2, SpO2, BP, heart rate and rhythm monitor placed and checked for adequate function Safety Precautions: Patient was assessed for positional comfort and pressure points before starting the procedure. Time-out: I initiated and conducted the "Time-out" before starting the procedure, as per protocol. The patient was asked to participate by confirming the accuracy of the "Time Out" information. Verification of the correct person, site, and procedure were performed and confirmed by me, the nursing staff, and the patient. "Time-out" conducted as per Joint Commission's Universal Protocol (UP.01.01.01). "Time-out" Date & Time: 09/13/2017; 0833 hrs.  Description of Procedure Process:   Position: Prone Target Area: For Lumbar Facet blocks, the target is the groove formed by the junction of the transverse process and superior articular process. For the L5 dorsal ramus, the target is the notch between superior articular process and  sacral ala. Approach: Paramedial approach. Area Prepped: Entire Posterior Lumbosacral Region Prepping solution: ChloraPrep (2% chlorhexidine gluconate and 70% isopropyl alcohol) Safety Precautions: Aspiration looking for blood return was conducted prior to all injections. At no point did we inject any substances, as a needle was being advanced. No attempts were made at seeking any paresthesias. Safe injection practices and needle disposal techniques used. Medications properly checked for expiration dates. SDV (single dose  vial) medications used. Description of the Procedure: Protocol guidelines were followed. The patient was placed in position over the fluoroscopy table. The target area was identified and the area prepped in the usual manner. Skin desensitized using vapocoolant spray. Skin & deeper tissues infiltrated with local anesthetic. Appropriate amount of time allowed to pass for local anesthetics to take effect. The procedure needle was introduced through the skin, ipsilateral to the reported pain, and advanced to the target area. Employing the "Medial Branch Technique", the needles were advanced to the angle made by the superior and medial portion of the transverse process, and the lateral and inferior portion of the superior articulating process of the targeted vertebral bodies. This area is known as "Burton's Eye" or the "Eye of the [place] Dog". A procedure needle was introduced through the skin, and this time advanced to the angle made by the superior and medial border of the sacral ala, and the lateral border of the S1 vertebral body. Negative aspiration confirmed. Solution injected in intermittent fashion, asking for systemic symptoms every 0.5cc of injectate. The needles were then removed and the area cleansed, making sure to leave some of the prepping solution back to take advantage of its long term bactericidal properties.   Illustration of the posterior view of the lumbar spine and the  posterior neural structures. Laminae of L2 through S1 are labeled. DPRL5, dorsal primary ramus of L5; DPRS1, dorsal primary ramus of S1; DPR3, dorsal primary ramus of L3; FJ, facet (zygapophyseal) joint L3-L4; I, inferior articular process of L4; LB1, lateral branch of dorsal primary ramus of L1; IAB, inferior articular branches from L3 medial branch (supplies L4-L5 facet joint); IBP, intermediate branch plexus; MB3, medial branch of dorsal primary ramus of L3; NR3, third lumbar nerve root; S, superior articular process of L5; SAB, superior articular branches from L4 (supplies L4-5 facet joint also); TP3, transverse process of L3.  Vitals:   09/13/17 0900 09/13/17 0910 09/13/17 0920 09/13/17 0930  BP: (!) 179/120 (!) 155/105 (!) 145/102 (!) 149/103  Pulse:      Resp: 16 18 16 16   Temp: 98 F (36.7 C)   97.9 F (36.6 C)  TempSrc:      SpO2: 98% 96% 98% 96%  Weight:      Height:        Start Time: 0833 hrs. End Time: 0850 hrs. Materials:  Needle(s) Type: Regular needle Gauge: 22G Length: 3.5-in Medication(s): We administered lactated ringers, fentaNYL, lidocaine (PF), and ropivacaine (PF) 2 mg/mL (0.2%). Please see chart orders for dosing details. 1 cc of 0.2% Ropivacaine at each level Imaging Guidance (Spinal):  Type of Imaging Technique: Fluoroscopy Guidance (Spinal) Indication(s): Assistance in needle guidance and placement for procedures requiring needle placement in or near specific anatomical locations not easily accessible without such assistance. Exposure Time: Please see nurses notes. Contrast: None used. Fluoroscopic Guidance: I was personally present during the use of fluoroscopy. "Tunnel Vision Technique" used to obtain the best possible view of the target area. Parallax error corrected before commencing the procedure. "Direction-depth-direction" technique used to introduce the needle under continuous pulsed fluoroscopy. Once target was reached, antero-posterior, oblique, and  lateral fluoroscopic projection used confirm needle placement in all planes. Images permanently stored in EMR. Interpretation: No contrast injected. I personally interpreted the imaging intraoperatively. Adequate needle placement confirmed in multiple planes. Permanent images saved into the patient's record.  Antibiotic Prophylaxis:  Indication(s): None identified Antibiotic given: None  Post-operative Assessment:  EBL: None Complications: No immediate post-treatment complications  observed by team, or reported by patient. Note: The patient tolerated the entire procedure well. A repeat set of vitals were taken after the procedure and the patient was kept under observation following institutional policy, for this type of procedure. Post-procedural neurological assessment was performed, showing return to baseline, prior to discharge. The patient was provided with post-procedure discharge instructions, including a section on how to identify potential problems. Should any problems arise concerning this procedure, the patient was given instructions to immediately contact us, at any time, without hesitation. In any case, we plan to contact the patient by telephone for a follow-up status report regarding this interventional procedure. Comments:  No additional relevant information.  Plan of Care  Disposition: Discharge home  Discharge Date & Time: 09/13/2017; 0935 hrs.  5 out of 5 strength bilateral lower extremity on discharge; plantar flexion, dorsiflexion, knee flexion, knee extension  Physician-requested Follow-up:  Return in 2 weeks (on 09/27/2017) for Post Procedure Evaluation.  Future Appointments Date Time Provider [place]  09/21/2017 2:45 PM Lucilla Lame, MD [facility] None  09/28/2017 8:45 AM Gillis Santa, MD [facility] None  08/08/2018 11:00 AM Lada, Satira Anis, MD [place] None    Imaging Orders     DG C-Arm 1-60 Min-No Report Procedure Orders    No procedure(s) ordered today     Medications ordered for procedure: Meds ordered this encounter  Medications  . lactated ringers infusion 1,000 mL  . fentaNYL (SUBLIMAZE) injection 25-50 mcg    Make sure Narcan is available in the pyxis when using this medication. In the event of respiratory depression (RR< 8/min): Titrate NARCAN (naloxone) in increments of 0.1 to 0.2 mg IV at 2-3 minute intervals, until desired degree of reversal.  . lidocaine (PF) (XYLOCAINE) 1 % injection 10 mL  . ropivacaine (PF) 2 mg/mL (0.2%) (NAROPIN) injection 10 mL   Medications administered: We administered lactated ringers, fentaNYL, lidocaine (PF), and ropivacaine (PF) 2 mg/mL (0.2%).  See the medical record for exact dosing, route, and time of administration.  New Prescriptions   No medications on file   Primary Care Physician: Arnetha Courser, MD Location: [facility] Note by: Gillis Santa, MD Date: 09/13/2017; Time: 10:51 AM  Disclaimer:  Medicine is not an exact science. The only guarantee in medicine is that nothing is guaranteed. It is important to note that the decision to proceed with this intervention was based on the information collected from the patient. The Data and conclusions were drawn from the patient's questionnaire, the interview, and the physical examination. Because the information was provided in large part by the patient, it cannot be guaranteed that it has not been purposely or unconsciously manipulated. Every effort has been made to obtain as much relevant data as possible for this evaluation. It is important to note that the conclusions that lead to this procedure are derived in large part from the available data. Always take into account that the treatment will also be dependent on availability of resources and existing treatment guidelines, considered by other Pain Management Practitioners as being common knowledge and practice, at the time of the intervention. For Medico-Legal  purposes, it is also important to point out that variation in procedural techniques and pharmacological choices are the acceptable norm. The indications, contraindications, technique, and results of the above procedure should only be interpreted and judged by a Board-Certified Interventional Pain Specialist with extensive familiarity and expertise in the same exact procedure and technique.

## 2017-09-13 NOTE — Progress Notes (Signed)
Safety precautions to be maintained throughout the outpatient stay will include: orient to surroundings, keep bed in low position, maintain call bell within reach at all times, provide assistance with transfer out of bed and ambulation.  

## 2017-09-13 NOTE — Patient Instructions (Signed)
Facet Blocks Patient Information  Description: The facets are joints in the spine between the vertebrae.  Like any joints in the body, facets can become irritated and painful.  Arthritis can also effect the facets.  By injecting steroids and local anesthetic in and around these joints, we can temporarily block the nerve supply to them.  Steroids act directly on irritated nerves and tissues to reduce selling and inflammation which often leads to decreased pain.  Facet blocks may be done anywhere along the spine from the neck to the low back depending upon the location of your pain.   After numbing the skin with local anesthetic (like Novocaine), a small needle is passed onto the facet joints under x-ray guidance.  You may experience a sensation of pressure while this is being done.  The entire block usually lasts about 15-25 minutes.   Conditions which may be treated by facet blocks:   Low back/buttock pain  Neck/shoulder pain  Certain types of headaches  Preparation for the injection:  1. Do not eat any solid food or dairy products within 8 hours of your appointment. 2. You may drink clear liquid up to 3 hours before appointment.  Clear liquids include water, black coffee, juice or soda.  No milk or cream please. 3. You may take your regular medication, including pain medications, with a sip of water before your appointment.  Diabetics should hold regular insulin (if taken separately) and take 1/2 normal NPH dose the morning of the procedure.  Carry some sugar containing items with you to your appointment. 4. A driver must accompany you and be prepared to drive you home after your procedure. 5. Bring all your current medications with you. 6. An IV may be inserted and sedation may be given at the discretion of the physician. 7. A blood pressure cuff, EKG and other monitors will often be applied during the procedure.  Some patients may need to have extra oxygen administered for a short  period. 8. You will be asked to provide medical information, including your allergies and medications, prior to the procedure.  We must know immediately if you are taking blood thinners (like Coumadin/Warfarin) or if you are allergic to IV iodine contrast (dye).  We must know if you could possible be pregnant.  Possible side-effects:   Bleeding from needle site  Infection (rare, may require surgery)  Nerve injury (rare)  Numbness & tingling (temporary)  Difficulty urinating (rare, temporary)  Spinal headache (a headache worse with upright posture)  Light-headedness (temporary)  Pain at injection site (serveral days)  Decreased blood pressure (rare, temporary)  Weakness in arm/leg (temporary)  Pressure sensation in back/neck (temporary)   Call if you experience:   Fever/chills associated with headache or increased back/neck pain  Headache worsened by an upright position  New onset, weakness or numbness of an extremity below the injection site  Hives or difficulty breathing (go to the emergency room)  Inflammation or drainage at the injection site(s)  Severe back/neck pain greater than usual  New symptoms which are concerning to you  Please note:  Although the local anesthetic injected can often make your back or neck feel good for several hours after the injection, the pain will likely return. It takes 3-7 days for steroids to work.  You may not notice any pain relief for at least one week.  If effective, we will often do a series of 2-3 injections spaced 3-6 weeks apart to maximally decrease your pain.  After the initial   series, you may be a candidate for a more permanent nerve block of the facets.  If you have any questions, please call [phone] [facility] COM[facility]S  What are the risk, side effects and possible complications? Generally speaking, most procedures are safe.  However, with any procedure  there are risks, side effects, and the possibility of complications.  The risks and complications are dependent upon the sites that are lesioned, or the type of nerve block to be performed.  The closer the procedure is to the spine, the more serious the risks are.  Great care is taken when placing the radio frequency needles, block needles or lesioning probes, but sometimes complications can occur. 1. Infection: Any time there is an injection through the skin, there is a risk of infection.  This is why sterile conditions are used for these blocks.  There are four possible types of infection. 1. Localized skin infection. 2. Central Nervous System Infection-This can be in the form of Meningitis, which can be deadly. 3. Epidural Infections-This can be in the form of an epidural abscess, which can cause pressure inside of the spine, causing compression of the spinal cord with subsequent paralysis. This would require an emergency surgery to decompress, and there are no guarantees that the patient would recover from the paralysis. 4. Discitis-This is an infection of the intervertebral discs.  It occurs in about 1% of discography procedures.  It is difficult to treat and it may lead to surgery.        2. Pain: the needles have to go through skin and soft tissues, will cause soreness.       3. Damage to internal structures:  The nerves to be lesioned may be near blood vessels or    other nerves which can be potentially damaged.       4. Bleeding: Bleeding is more common if the patient is taking blood thinners such as  aspirin, Coumadin, Ticiid, Plavix, etc., or if he/she have some genetic predisposition  such as hemophilia. Bleeding into the spinal canal can cause compression of the spinal  cord with subsequent paralysis.  This would require an emergency surgery to  decompress and there are no guarantees that the patient would recover from the  paralysis.       5. Pneumothorax:  Puncturing of a lung is a  possibility, every time a needle is introduced in  the area of the chest or upper back.  Pneumothorax refers to free air around the  collapsed lung(s), inside of the thoracic cavity (chest cavity).  Another two possible  complications related to a similar event would include: Hemothorax and Chylothorax.   These are variations of the Pneumothorax, where instead of air around the collapsed  lung(s), you may have blood or chyle, respectively.       6. Spinal headaches: They may occur with any procedures in the area of the spine.       7. Persistent CSF (Cerebro-Spinal Fluid) leakage: This is a rare problem, but may occur  with prolonged intrathecal or epidural catheters either due to the formation of a fistulous  track or a dural tear.       8. Nerve damage: By working so close to the spinal cord, there is always a possibility of  nerve damage, which could be as serious as a permanent spinal cord injury with  paralysis.       9. Death:  Although rare, severe deadly allergic   reactions known as "Anaphylactic  reaction" can occur to any of the medications used.      10. Worsening of the symptoms:  We can always make thing worse.  What are the chances of something like this happening? Chances of any of this occuring are extremely low.  By statistics, you have more of a chance of getting killed in a motor vehicle accident: while driving to the hospital than any of the above occurring .  Nevertheless, you should be aware that they are possibilities.  In general, it is similar to taking a shower.  Everybody knows that you can slip, hit your head and get killed.  Does that mean that you should not shower again?  Nevertheless always keep in mind that statistics do not mean anything if you happen to be on the wrong side of them.  Even if a procedure has a 1 (one) in a 1,000,000 (million) chance of going wrong, it you happen to be that one..Also, keep in mind that by statistics, you have more of a chance of having  something go wrong when taking medications.  Who should not have this procedure? If you are on a blood thinning medication (e.g. Coumadin, Plavix, see list of "Blood Thinners"), or if you have an active infection going on, you should not have the procedure.  If you are taking any blood thinners, please inform your physician.  How should I prepare for this procedure?  Do not eat or drink anything at least six hours prior to the procedure.  Bring a driver with you .  It cannot be a taxi.  Come accompanied by an adult that can drive you back, and that is strong enough to help you if your legs get weak or numb from the local anesthetic.  Take all of your medicines the morning of the procedure with just enough water to swallow them.  If you have diabetes, make sure that you are scheduled to have your procedure done first thing in the morning, whenever possible.  If you have diabetes, take only half of your insulin dose and notify our nurse that you have done so as soon as you arrive at the clinic.  If you are diabetic, but only take blood sugar pills (oral hypoglycemic), then do not take them on the morning of your procedure.  You may take them after you have had the procedure.  Do not take aspirin or any aspirin-containing medications, at least eleven (11) days prior to the procedure.  They may prolong bleeding.  Wear loose fitting clothing that may be easy to take off and that you would not mind if it got stained with Betadine or blood.  Do not wear any jewelry or perfume  Remove any nail coloring.  It will interfere with some of our monitoring equipment.  NOTE: Remember that this is not meant to be interpreted as a complete list of all possible complications.  Unforeseen problems may occur.  BLOOD THINNERS The following drugs contain aspirin or other products, which can cause increased bleeding during surgery and should not be taken for 2 weeks prior to and 1 week after surgery.  If you  should need take something for relief of minor pain, you may take acetaminophen which is found in Tylenol,m Datril, Anacin-3 and Panadol. It is not blood thinner. The products listed below are.  Do not take any of the products listed below in addition to any listed on your instruction sheet.  A.P.C or A.P.C with   Codeine Codeine Phosphate Capsules #3 Ibuprofen Ridaura  ABC compound Congesprin Imuran rimadil  Advil Cope Indocin Robaxisal  Alka-Seltzer Effervescent Pain Reliever and Antacid Coricidin or Coricidin-D  Indomethacin Rufen  Alka-Seltzer plus Cold Medicine Cosprin Ketoprofen S-A-C Tablets  Anacin Analgesic Tablets or Capsules Coumadin Korlgesic Salflex  Anacin Extra Strength Analgesic tablets or capsules CP-2 Tablets Lanoril Salicylate  Anaprox Cuprimine Capsules Levenox Salocol  Anexsia-D Dalteparin Magan Salsalate  Anodynos Darvon compound Magnesium Salicylate Sine-off  Ansaid Dasin Capsules Magsal Sodium Salicylate  Anturane Depen Capsules Marnal Soma  APF Arthritis pain formula Dewitt's Pills Measurin Stanback  Argesic Dia-Gesic Meclofenamic Sulfinpyrazone  Arthritis Bayer Timed Release Aspirin Diclofenac Meclomen Sulindac  Arthritis pain formula Anacin Dicumarol Medipren Supac  Analgesic (Safety coated) Arthralgen Diffunasal Mefanamic Suprofen  Arthritis Strength Bufferin Dihydrocodeine Mepro Compound Suprol  Arthropan liquid Dopirydamole Methcarbomol with Aspirin Synalgos  ASA tablets/Enseals Disalcid Micrainin Tagament  Ascriptin Doan's Midol Talwin  Ascriptin A/D Dolene Mobidin Tanderil  Ascriptin Extra Strength Dolobid Moblgesic Ticlid  Ascriptin with Codeine Doloprin or Doloprin with Codeine Momentum Tolectin  Asperbuf Duoprin Mono-gesic Trendar  Aspergum Duradyne Motrin or Motrin IB Triminicin  Aspirin plain, buffered or enteric coated Durasal Myochrisine Trigesic  Aspirin Suppositories Easprin Nalfon Trillsate  Aspirin with Codeine Ecotrin Regular or Extra Strength  Naprosyn Uracel  Atromid-S Efficin Naproxen Ursinus  Auranofin Capsules Elmiron Neocylate Vanquish  Axotal Emagrin Norgesic Verin  Azathioprine Empirin or Empirin with Codeine Normiflo Vitamin E  Azolid Emprazil Nuprin Voltaren  Bayer Aspirin plain, buffered or children's or timed BC Tablets or powders Encaprin Orgaran Warfarin Sodium  Buff-a-Comp Enoxaparin Orudis Zorpin  Buff-a-Comp with Codeine Equegesic Os-Cal-Gesic   Buffaprin Excedrin plain, buffered or Extra Strength Oxalid   Bufferin Arthritis Strength Feldene Oxphenbutazone   Bufferin plain or Extra Strength Feldene Capsules Oxycodone with Aspirin   Bufferin with Codeine Fenoprofen Fenoprofen Pabalate or Pabalate-SF   Buffets II Flogesic Panagesic   Buffinol plain or Extra Strength Florinal or Florinal with Codeine Panwarfarin   Buf-Tabs Flurbiprofen Penicillamine   Butalbital Compound Four-way cold tablets Penicillin   Butazolidin Fragmin Pepto-Bismol   Carbenicillin Geminisyn Percodan   Carna Arthritis Reliever Geopen Persantine   Carprofen Gold's salt Persistin   Chloramphenicol Goody's Phenylbutazone   Chloromycetin Haltrain Piroxlcam   Clmetidine heparin Plaquenil   Cllnoril Hyco-pap Ponstel   Clofibrate Hydroxy chloroquine Propoxyphen         Before stopping any of these medications, be sure to consult the physician who ordered them.  Some, such as Coumadin (Warfarin) are ordered to prevent or treat serious conditions such as "deep thrombosis", "pumonary embolisms", and other heart problems.  The amount of time that you may need off of the medication may also vary with the medication and the reason for which you were taking it.  If you are taking any of these medications, please make sure you notify your pain physician before you undergo any procedures.         Pain Management Discharge Instructions  General Discharge Instructions :  If you need to reach your doctor call: Monday-Friday 8:00 am - 4:00 pm at  [phone] or toll free [phone].  After clinic hours [phone] to have operator reach doctor.  Bring all of your medication bottles to all your appointments in the pain clinic.  To cancel or reschedule your appointment with Pain Management please remember to call 24 hours in advance to avoid a fee.  Refer to the educational materials which you have been given on: General Risks, I  had my Procedure. Discharge Instructions, Post Sedation.  Post Procedure Instructions:  The drugs you were given will stay in your system until tomorrow, so for the next 24 hours you should not drive, make any legal decisions or drink any alcoholic beverages.  You may eat anything you prefer, but it is better to start with liquids then soups and crackers, and gradually work up to solid foods.  Please notify your doctor immediately if you have any unusual bleeding, trouble breathing or pain that is not related to your normal pain.  Depending on the type of procedure that was done, some parts of your body may feel week and/or numb.  This usually clears up by tonight or the next day.  Walk with the use of an assistive device or accompanied by an adult for the 24 hours.  You may use ice on the affected area for the first 24 hours.  Put ice in a Ziploc bag and cover with a towel and place against area 15 minutes on 15 minutes off.  You may switch to heat after 24 hours.

## 2017-09-20 ENCOUNTER — Ambulatory Visit
Payer: 59 | Attending: Student in an Organized Health Care Education/Training Program | Admitting: Student in an Organized Health Care Education/Training Program

## 2017-09-20 ENCOUNTER — Encounter: Payer: Self-pay | Admitting: Student in an Organized Health Care Education/Training Program

## 2017-09-20 VITALS — BP 150/125 | HR 101 | Temp 98.3°F | Resp 18 | Ht 71.0 in | Wt 230.0 lb

## 2017-09-20 DIAGNOSIS — M545 Low back pain: Secondary | ICD-10-CM

## 2017-09-20 DIAGNOSIS — M109 Gout, unspecified: Secondary | ICD-10-CM | POA: Insufficient documentation

## 2017-09-20 DIAGNOSIS — K589 Irritable bowel syndrome without diarrhea: Secondary | ICD-10-CM | POA: Diagnosis not present

## 2017-09-20 DIAGNOSIS — K219 Gastro-esophageal reflux disease without esophagitis: Secondary | ICD-10-CM | POA: Insufficient documentation

## 2017-09-20 DIAGNOSIS — Z794 Long term (current) use of insulin: Secondary | ICD-10-CM | POA: Insufficient documentation

## 2017-09-20 DIAGNOSIS — M47816 Spondylosis without myelopathy or radiculopathy, lumbar region: Secondary | ICD-10-CM

## 2017-09-20 DIAGNOSIS — I2510 Atherosclerotic heart disease of native coronary artery without angina pectoris: Secondary | ICD-10-CM | POA: Insufficient documentation

## 2017-09-20 DIAGNOSIS — D127 Benign neoplasm of rectosigmoid junction: Secondary | ICD-10-CM | POA: Diagnosis not present

## 2017-09-20 DIAGNOSIS — Z800 Family history of malignant neoplasm of digestive organs: Secondary | ICD-10-CM | POA: Diagnosis not present

## 2017-09-20 DIAGNOSIS — M4696 Unspecified inflammatory spondylopathy, lumbar region: Secondary | ICD-10-CM | POA: Diagnosis not present

## 2017-09-20 DIAGNOSIS — I700 Atherosclerosis of aorta: Secondary | ICD-10-CM | POA: Diagnosis not present

## 2017-09-20 DIAGNOSIS — E119 Type 2 diabetes mellitus without complications: Secondary | ICD-10-CM | POA: Insufficient documentation

## 2017-09-20 DIAGNOSIS — M5137 Other intervertebral disc degeneration, lumbosacral region: Secondary | ICD-10-CM | POA: Diagnosis not present

## 2017-09-20 DIAGNOSIS — M5136 Other intervertebral disc degeneration, lumbar region: Secondary | ICD-10-CM | POA: Diagnosis not present

## 2017-09-20 DIAGNOSIS — E785 Hyperlipidemia, unspecified: Secondary | ICD-10-CM | POA: Insufficient documentation

## 2017-09-20 DIAGNOSIS — Z87891 Personal history of nicotine dependence: Secondary | ICD-10-CM | POA: Diagnosis not present

## 2017-09-20 DIAGNOSIS — G8929 Other chronic pain: Secondary | ICD-10-CM | POA: Diagnosis not present

## 2017-09-20 DIAGNOSIS — I10 Essential (primary) hypertension: Secondary | ICD-10-CM | POA: Insufficient documentation

## 2017-09-20 DIAGNOSIS — R748 Abnormal levels of other serum enzymes: Secondary | ICD-10-CM | POA: Insufficient documentation

## 2017-09-20 NOTE — Patient Instructions (Addendum)
1. Schedule for diagnostic lumbar facet block #2 with sedation. 2. Decrease amitriptyline to 25 mg in the evening.Pain Management Discharge Instructions  General Discharge Instructions :  If you need to reach your doctor call: Monday-Friday 8:00 am - 4:00 pm at [phone] or toll free [phone].  After clinic hours [phone] to have operator reach doctor.  Bring all of your medication bottles to all your appointments in the pain clinic.  To cancel or reschedule your appointment with Pain Management please remember to call 24 hours in advance to avoid a fee.  Refer to the educational materials which you have been given on: General Risks, I had my Procedure. Discharge Instructions, Post Sedation.  Post Procedure Instructions:  The drugs you were given will stay in your system until tomorrow, so for the next 24 hours you should not drive, make any legal decisions or drink any alcoholic beverages.  You may eat anything you prefer, but it is better to start with liquids then soups and crackers, and gradually work up to solid foods.  Please notify your doctor immediately if you have any unusual bleeding, trouble breathing or pain that is not related to your normal pain.  Depending on the type of procedure that was done, some parts of your body may feel week and/or numb.  This usually clears up by tonight or the next day.  Walk with the use of an assistive device or accompanied by an adult for the 24 hours.  You may use ice on the affected area for the first 24 hours.  Put ice in a Ziploc bag and cover with a towel and place against area 15 minutes on 15 minutes off.  You may switch to heat after 24 hours. Facet Joint Block The facet joints connect the bones of the spine (vertebrae). They make it possible for you to bend, twist, and make other movements with your spine. They also keep you from bending too far, twisting too far, and making other excessive movements. A facet joint block  is a procedure where a numbing medicine (anesthetic) is injected into a facet joint. Often, a type of anti-inflammatory medicine called a steroid is also injected. A facet joint block may be done to diagnose neck or back pain. If the pain gets better after a facet joint block, it means the pain is probably coming from the facet joint. If the pain does not get better, it means the pain is probably not coming from the facet joint. A facet joint block may also be done to relieve neck or back pain caused by an inflamed facet joint. A facet joint block is only done to relieve pain if the pain does not improve with other methods, such as medicine, exercise programs, and physical therapy. Tell a health care provider about:  Any allergies you have.  All medicines you are taking, including vitamins, herbs, eye drops, creams, and over-the-counter medicines.  Any problems you or family members have had with anesthetic medicines.  Any blood disorders you have.  Any surgeries you have had.  Any medical conditions you have.  Whether you are pregnant or may be pregnant. What are the risks? Generally, this is a safe procedure. However, problems may occur, including:  Bleeding.  Injury to a nerve near the injection site.  Pain at the injection site.  Weakness or numbness in areas controlled by nerves near the injection site.  Infection.  Temporary fluid retention.  Allergic reactions to medicines or dyes.  Injury to other  structures or organs near the injection site.  What happens before the procedure?  Follow instructions from your health care provider about eating or drinking restrictions.  Ask your health care provider about: ? Changing or stopping your regular medicines. This is especially important if you are taking diabetes medicines or blood thinners. ? Taking medicines such as aspirin and ibuprofen. These medicines can thin your blood. Do not take these medicines before your procedure  if your health care provider instructs you not to.  Do not take any new dietary supplements or medicines without asking your health care provider first.  Plan to have someone take you home after the procedure. What happens during the procedure?  You may need to remove your clothing and dress in an open-back gown.  The procedure will be done while you are lying on an X-ray table. You will most likely be asked to lie on your stomach, but you may be asked to lie in a different position if an injection will be made in your neck.  Machines will be used to monitor your oxygen levels, heart rate, and blood pressure.  If an injection will be made in your neck, an IV tube will be inserted into one of your veins. Fluids and medicine will flow directly into your body through the IV tube.  The area over the facet joint where the injection will be made will be cleaned with soap. The surrounding skin will be covered with clean drapes.  A numbing medicine (local anesthetic) will be applied to your skin. Your skin may sting or burn for a moment.  A video X-ray machine (fluoroscopy) will be used to locate the joint. In some cases, a CT scan may be used.  A contrast dye may be injected into the facet joint area to help locate the joint.  When the joint is located, an anesthetic will be injected into the joint through the needle.  Your health care provider will ask you whether you feel pain relief. If you do feel relief, a steroid may be injected to provide pain relief for a longer period of time. If you do not feel relief or feel only partial relief, additional injections of an anesthetic may be made in other facet joints.  The needle will be removed.  Your skin will be cleaned.  A bandage (dressing) will be applied over each injection site. The procedure may vary among health care providers and hospitals. What happens after the procedure?  You will be observed for 15-30 minutes before being allowed  to go home. This information is not intended to replace advice given to you by your health care provider. Make sure you discuss any questions you have with your health care provider. Document Released: 05/03/2007 Document Revised: 01/13/2016 Document Reviewed: 09/07/2015 [employer] Interactive Patient Education  [employer].

## 2017-09-20 NOTE — Progress Notes (Signed)
Patient's Name: John Hartman  MRN: [id number]  Referring Provider: Arnetha Courser, MD  DOB: May 19, 1971  PCP: Arnetha Courser, MD  DOS: 09/20/2017  Note by: Gillis Santa, MD  Service setting: Ambulatory outpatient  Specialty: Interventional Pain Management  Location: [facility] (AMB) Pain Management Facility    Patient type: Established   Primary Reason(s) for Visit: Encounter for post-procedure evaluation of chronic illness with mild to moderate exacerbation CC: Back Pain (low)  HPI  Mr. Brumbach is a 46 y.o. year old, male patient, who comes today for a post-procedure evaluation. He has Essential hypertension; Gout; IBS (irritable bowel syndrome); Family history of malignant neoplasm of gastrointestinal tract; Benign neoplasm of rectosigmoid junction; Palpitations; Decongestant abuse; Ketonuria; Glucosuria; Preventative health care; Chest pain; Diabetes mellitus ([place]); Encounter for medication monitoring; Coronary artery disease; Angioedema; Hyperlipidemia LDL goal <70; Elevated serum glutamic pyruvic transaminase (SGPT) level; Elevated liver enzymes; Serum total bilirubin elevated; Fatty liver; Chronic lower back pain; Degenerative disc disease, lumbar; Arthritis, 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[place]); Calcification of abdominal aorta (HCC); and Elevated hemoglobin ([place]) on his problem list. His primarily concern today is the Back Pain (low)  Pain Assessment: Location: Lower Back Radiating:   Onset: More than a month ago Duration: Chronic pain Quality: Aching, Constant, Tightness, Pressure Severity: 5 /10 (self-reported pain score)  Note: Reported level is compatible with observation.                   When using our objective Pain Scale, levels between 6 and 10/10 are said to belong in an emergency room, as it progressively worsens from a 6/10, described as severely limiting, requiring emergency care not usually available at an outpatient pain management facility. At a 6/10 level, communication becomes difficult and requires  great effort. Assistance to reach the emergency department may be required. Facial flushing and profuse sweating along with potentially dangerous increases in heart rate and blood pressure will be evident. Effect on ADL:   Timing: Constant Modifying factors:    Mr. Woelfel comes in today for post-procedure evaluation after the treatment done on 09/13/2017.  46 year old male with a past medical history of hypertension, irritable bowel syndrome, coronary artery disease, type 2 diabetes on insulin who presents with low back pain secondary to lumbar spondylosis without myelopathy, lumbar facet arthropathy.Patient does have pain with facet loading, that radiates to his low back and buttock region but does not extend beyond his knees. Patient straight leg raise test was negative. He also endorses greater pain with lumbar extension and lateral rotation as well as suggesting facet pathology. Patient hastried physical therapy which was not very effective and has tried various medications including tramadol, Flexeril, naproxen which were not effective either. Lumbar spine xray with facet arthropathy and multilevel DDD, most pronounced at L3-S1.  Status post bilateral diagnostic lumbar facets at L3-L5.patient states that for the first 2-3 days after his block he was almost pain-free. Patient endorsed greater range of motion ability to participate in activities of daily living such as bending and twisting and overall pain relief. He was very pleased with the procedure and would like to proceed with second diagnostic block and then radiofrequency ablation for hopefully a longer lasting pain relief.  Further details on both, my assessment(s), as well as the proposed treatment plan, please see below.  Post-Procedure Assessment  09/13/2017 Procedure: bilateral L3/L4, L4/L5, L5/S1 facet medial branch nerve blocks Pre-procedure pain score:  5/10 Post-procedure pain score: 0/10 (100% relief) Influential Factors: BMI:  32.08 kg/m Intra-procedural challenges: None  observed.         Assessment challenges: None detected.              Reported side-effects: None.        Post-procedural adverse reactions or complications: None reported         Sedation: Please see nurses note. When no sedatives are used, the analgesic levels obtained are directly associated to the effectiveness of the local anesthetics. However, when sedation is provided, the level of analgesia obtained during the initial 1 hour following the intervention, is believed to be the result of a combination of factors. These factors may include, but are not limited to: 1. The effectiveness of the local anesthetics used. 2. The effects of the analgesic(s) and/or anxiolytic(s) used. 3. The degree of discomfort experienced by the patient at the time of the procedure. 4. The patients ability and reliability in recalling and recording the events. 5. The presence and influence of possible secondary gains and/or psychosocial factors. Reported result: Relief experienced during the 1st hour after the procedure: 100 % (Ultra-Short Term Relief)            Interpretative annotation: Clinically appropriate result. Analgesia during this period is likely to be Local Anesthetic and/or IV Sedative (Analgesic/Anxiolytic) related.          Effects of local anesthetic: The analgesic effects attained during this period are directly associated to the localized infiltration of local anesthetics and therefore cary significant diagnostic value as to the etiological location, or anatomical origin, of the pain. Expected duration of relief is directly dependent on the pharmacodynamics of the local anesthetic used. Long-acting (4-6 hours) anesthetics used.  Reported result: Relief during the next 4 to 6 hour after the procedure: 100 % (Short-Term Relief)            Interpretative annotation: Clinically appropriate result. Analgesia during this period is likely to be Local  Anesthetic-related.          Long-term benefit: Defined as the period of time past the expected duration of local anesthetics (1 hour for short-acting and 4-6 hours for long-acting). With the possible exception of prolonged sympathetic blockade from the local anesthetics, benefits during this period are typically attributed to, or associated with, other factors such as analgesic sensory neuropraxia, antiinflammatory effects, or beneficial biochemical changes provided by agents other than the local anesthetics.  Reported result: Extended relief following procedure: 80 % (Long-Term Relief) Mr. Devenport reports that both, extremity and the axial pain improved with the treatment. Interpretative annotation: Clinically appropriate result. Good relief. No permanent benefit expected. Inflammation plays a part in the etiology to the pain.          Current benefits: Defined as reported results that persistent at this point in time.   Analgesia: <25 %            Function: Somewhat improved ROM: Somewhat improved Interpretative annotation: Recurrence of symptoms. No permanent benefit expected. Effective diagnostic intervention.          Interpretation: Results would suggest a successful diagnostic intervention. We'll proceed with diagnostic intervention #2, as soon as convenient          Plan:  Please see "Plan of Care" for details.       Laboratory Chemistry  Inflammation Markers (CRP: Acute Phase) (ESR: Chronic Phase) No results found for: CRP, ESRSEDRATE               Renal Function Markers Lab Results  Component Value Date     BUN 19 08/04/2017   CREATININE 1.20 08/04/2017   GFRAA 85 08/04/2017   GFRNONAA 73 08/04/2017                 Hepatic Function Markers Lab Results  Component Value Date   AST 71 (H) 08/04/2017   ALT 122 (H) 08/04/2017   ALBUMIN 4.9 08/04/2017   ALKPHOS 87 08/04/2017                 Electrolytes Lab Results  Component Value Date   NA 139 08/04/2017   K 4.8  08/04/2017   CL 101 08/04/2017   CALCIUM 10.0 08/04/2017   MG 2.0 08/27/2016                 Neuropathy Markers No results found for: [id number]               Bone Pathology Markers Lab Results  Component Value Date   ALKPHOS 87 08/04/2017   VD25OH 30 08/04/2017   CALCIUM 10.0 08/04/2017                 Coagulation Parameters Lab Results  Component Value Date   INR 0.96 08/11/2016   LABPROT 12.8 08/11/2016   PLT 195 08/04/2017                 Cardiovascular Markers Lab Results  Component Value Date   HGB 17.2 (H) 08/04/2017   HCT 51.1 (H) 08/04/2017                 Note: Lab results reviewed.  Recent Diagnostic Imaging Results  DG C-Arm 1-60 Min-No Report Fluoroscopy was utilized by the requesting physician.  No radiographic  interpretation.   Note: Imaging results reviewed.        Meds   Current Outpatient Prescriptions:  .  ACCU-CHEK SOFTCLIX LANCETS lancets, USE AS DIRECTED, Disp: 100 each, Rfl: 0 .  allopurinol (ZYLOPRIM) 100 MG tablet, Take 100 mg by mouth every morning. , Disp: , Rfl:  .  amitriptyline (ELAVIL) 25 MG tablet, 25 mg qhs x 2 weeks, then 50 mg qhs, Disp: 60 tablet, Rfl: 1 .  belladonna-PHENObarbital (DONNATAL) 16.2 MG/5ML ELIX, Take 5 mLs (16.2 mg total) by mouth 4 (four) times daily as needed for cramping or pain. (Patient taking differently: Take 5 mLs by mouth 4 (four) times daily as needed for cramping or pain. ), Disp: 600 mL, Rfl: 1 .  cetirizine (ZYRTEC) 10 MG tablet, Take 10 mg by mouth at bedtime. , Disp: , Rfl:  .  colchicine 0.6 MG tablet, Take 0.6 mg by mouth daily as needed., Disp: , Rfl:  .  cyclobenzaprine (FLEXERIL) 10 MG tablet, TAKE 1 TABLET(10 MG) BY MOUTH EVERY 8 HOURS AS NEEDED FOR MUSCLE SPASMS, Disp: 30 tablet, Rfl: 0 .  diphenhydrAMINE (BENADRYL) 25 mg capsule, Take 25 mg by mouth at bedtime. , Disp: , Rfl:  .  EPINEPHrine (EPIPEN 2-PAK) 0.3 mg/0.3 mL IJ SOAJ injection, Inject 0.3 mg into the muscle once., Disp: , Rfl:  .   glucose blood (ACCU-CHEK ACTIVE STRIPS) test strip, Use as instructed, Disp: 100 each, Rfl: 12 .  glucose blood (ACCU-CHEK AVIVA PLUS) test strip, Check FSBS three to four times per day, Disp: 100 each, Rfl: 1 .  Insulin Degludec (TRESIBA FLEXTOUCH) 100 UNIT/ML SOPN, Inject 20 Units into the skin at bedtime. , Disp: 1 pen, Rfl: 0 .  Insulin Pen Needle (B-D UF III MINI PEN NEEDLES) 31G X 5 MM MISC,  Once a day with insulin, Disp: 50 each, Rfl: 3 .  Melatonin 5 MG TABS, Take 5 mg by mouth at bedtime. , Disp: , Rfl:  .  metoprolol tartrate (LOPRESSOR) 25 MG tablet, TAKE 1 TABLET(25 MG) BY MOUTH TWICE DAILY, Disp: 180 tablet, Rfl: 3 .  naproxen sodium (ANAPROX) 220 MG tablet, Take 1-2 tablets (220-440 mg total) by mouth 2 (two) times daily with a meal., Disp: , Rfl:  .  ranitidine (ZANTAC) 150 MG capsule, Take 150 mg by mouth at bedtime. , Disp: , Rfl:  .  traMADol (ULTRAM) 50 MG tablet, Take 50 mg by mouth every 8 (eight) hours as needed for moderate pain or severe pain., Disp: , Rfl:  .  metFORMIN (GLUCOPHAGE-XR) 500 MG 24 hr tablet, Take 1,500 mg by mouth every evening. Take with supper., Disp: , Rfl:   ROS  Constitutional: Denies any fever or chills Gastrointestinal: No reported hemesis, hematochezia, vomiting, or acute GI distress Musculoskeletal: Denies any acute onset joint swelling, redness, loss of ROM, or weakness Neurological: No reported episodes of acute onset apraxia, aphasia, dysarthria, agnosia, amnesia, paralysis, loss of coordination, or loss of consciousness  Allergies  Mr. Villafranca is allergic to lisinopril and ace inhibitors.  PFSH  Drug: Mr. Elman  reports that he does not use drugs. Alcohol:  reports that he does not drink alcohol. Tobacco:  reports that he quit smoking about 2 years ago. His smoking use included Cigarettes. He has a 10.00 pack-year smoking history. He has never used smokeless tobacco. Medical:  has a past medical history of Angioedema; DDD (degenerative disc  disease), cervical; Diabetes mellitus (HCC); Fatty liver (03/31/2017); GERD (gastroesophageal reflux disease); Gout; HTN (hypertension); Hypertension; IBS (irritable bowel syndrome); Seasonal allergies; and Spinal stenosis. Surgical: Mr. Upperman  has a past surgical history that includes Shoulder surgery (Left, 2007); Nasal septum surgery (2004); ACDF with fusion (2007); Testicular torsion (1980s); Vasectomy; Colonoscopy with propofol (N/A, 06/26/2015); and Cardiac catheterization (Left, 08/15/2016). Family: family history includes Allergies in his son; Alzheimer's disease in his maternal grandmother; Arrhythmia in his mother; Breast cancer in his mother; Cancer in his maternal grandfather and mother; Diabetes in his father; Heart disease in his brother; Hypertension in his father; Skin cancer in his mother.  Constitutional Exam  General appearance: Well nourished, well developed, and well hydrated. In no apparent acute distress Vitals:   09/20/17 1306  BP: (!) 150/125  Pulse: (!) 101  Resp: 18  Temp: 98.3 F (36.8 C)  TempSrc: Oral  SpO2: 97%  Weight: 230 lb (104.3 kg)  Height: 5' 11" (1.803 m)   BMI Assessment: Estimated body mass index is 32.08 kg/m as calculated from the following:   Height as of this encounter: 5' 11" (1.803 m).   Weight as of this encounter: 230 lb (104.3 kg).  BMI interpretation table: BMI level Category Range association with higher incidence of chronic pain  <18 kg/m2 Underweight   18.5-24.9 kg/m2 Ideal body weight   25-29.9 kg/m2 Overweight Increased incidence by 20%  30-34.9 kg/m2 Obese (Class I) Increased incidence by 68%  35-39.9 kg/m2 Severe obesity (Class II) Increased incidence by 136%  >40 kg/m2 Extreme obesity (Class III) Increased incidence by 254%   BMI Readings from Last 4 Encounters:  09/20/17 32.08 kg/m  09/13/17 31.38 kg/m  08/24/17 31.52 kg/m  08/04/17 31.80 kg/m   Wt Readings from Last 4 Encounters:  09/20/17 230 lb (104.3 kg)  09/13/17  225 lb (102.1 kg)  08/24/17 226 lb (102.5 kg)    08/04/17 228 lb (103.4 kg)  Psych/Mental status: Alert, oriented x 3 (person, place, & time)       Eyes: PERLA Respiratory: No evidence of acute respiratory distress  Cervical Spine Area Exam  Skin & Axial Inspection: No masses, redness, edema, swelling, or associated skin lesions Alignment: Symmetrical Functional ROM: Unrestricted ROM      Stability: No instability detected Muscle Tone/Strength: Functionally intact. No obvious neuro-muscular anomalies detected. Sensory (Neurological): Unimpaired Palpation: No palpable anomalies              Upper Extremity (UE) Exam    Side: Right upper extremity  Side: Left upper extremity  Skin & Extremity Inspection: Skin color, temperature, and hair growth are WNL. No peripheral edema or cyanosis. No masses, redness, swelling, asymmetry, or associated skin lesions. No contractures.  Skin & Extremity Inspection: Skin color, temperature, and hair growth are WNL. No peripheral edema or cyanosis. No masses, redness, swelling, asymmetry, or associated skin lesions. No contractures.  Functional ROM: Unrestricted ROM          Functional ROM: Unrestricted ROM          Muscle Tone/Strength: Functionally intact. No obvious neuro-muscular anomalies detected.  Muscle Tone/Strength: Functionally intact. No obvious neuro-muscular anomalies detected.  Sensory (Neurological): Unimpaired          Sensory (Neurological): Unimpaired          Palpation: No palpable anomalies              Palpation: No palpable anomalies              Specialized Test(s): Deferred         Specialized Test(s): Deferred          Thoracic Spine Area Exam  Skin & Axial Inspection: No masses, redness, or swelling Alignment: Symmetrical Functional ROM: Unrestricted ROM Stability: No instability detected Muscle Tone/Strength: Functionally intact. No obvious neuro-muscular anomalies detected. Sensory (Neurological): Unimpaired Muscle strength &  Tone: No palpable anomalies  Lumbar Spine Area Exam  Skin & Axial Inspection: No masses, redness, or swelling Alignment: Symmetrical Functional ROM: Unrestricted ROM      Stability: No instability detected Muscle Tone/Strength: Functionally intact. No obvious neuro-muscular anomalies detected. Sensory (Neurological): Improved Palpation: No palpable anomalies       Provocative Tests: Lumbar Hyperextension and rotation test: Improved after treatment however still positive bilaterally for facet mediated pain. Lumbar Lateral bending test: Improved after treatment       Patrick's Maneuver: evaluation deferred today                    Gait & Posture Assessment  Ambulation: Unassisted Gait: Relatively normal for age and body habitus Posture: WNL   Lower Extremity Exam    Side: Right lower extremity  Side: Left lower extremity  Skin & Extremity Inspection: Skin color, temperature, and hair growth are WNL. No peripheral edema or cyanosis. No masses, redness, swelling, asymmetry, or associated skin lesions. No contractures.  Skin & Extremity Inspection: Skin color, temperature, and hair growth are WNL. No peripheral edema or cyanosis. No masses, redness, swelling, asymmetry, or associated skin lesions. No contractures.  Functional ROM: Unrestricted ROM          Functional ROM: Unrestricted ROM          Muscle Tone/Strength: Functionally intact. No obvious neuro-muscular anomalies detected.  Muscle Tone/Strength: Functionally intact. No obvious neuro-muscular anomalies detected.  Sensory (Neurological): Unimpaired  Sensory (Neurological): Unimpaired  Palpation: No palpable anomalies  Palpation:   No palpable anomalies   Assessment  Primary Diagnosis & Pertinent Problem List: The primary encounter diagnosis was Spondylosis of lumbar region without myelopathy or radiculopathy. Diagnoses of Lumbar facet arthropathy (HCC), Chronic bilateral low back pain without sciatica, Degenerative disc disease,  lumbar, and Arthritis, 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HCC) were also pertinent to this visit.  Status Diagnosis  Responding Responding Responding 1. Spondylosis of lumbar region without myelopathy or radiculopathy   2. Lumbar facet arthropathy (HCC)   3. Chronic bilateral low back pain without sciatica   4. Degenerative disc disease, lumbar   5. Arthritis, 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HCC)      46-year-old male with a past medical history of hypertension, irritable bowel syndrome, coronary artery disease, type 2 diabetes on insulin who presents with low back pain secondary to lumbar spondylosis without myelopathy, lumbar facet arthropathy.Patient does have pain with facet loading, that radiates to his low back and buttock region but does not extend beyond his knees. Patient straight leg raise test was negative. He also endorses greater pain with lumbar extension and lateral rotation as well as suggesting facet pathology. Patient hastried physical therapy which was not very effective and has tried various medications including tramadol, Flexeril, naproxen which were not effective either. Lumbar spine xray with facet arthropathy and multilevel DDD, most pronounced at L3-S1  Status post diagnostic lumbar facets on 09/13/2017 with greater than 80% improvement in low back pain and buttock pain along with range of motion for 3-4 days after the procedure. Patient states that his pain was better and that he was able to ambulate with a greater degree of comfort. This would suggest a successful diagnostic block, plan for bilateral diagnostic block #2 followed by radiofrequency ablation.  Furthermore patient states the amitriptyline at his current dose of 50 mg is making him sedated the following morning.I told patient to decrease dose back to 25 mg daily at bedtime.  Plan: -Successful diagnostic intervention, plan for diagnostic block #2 bilaterally followed by radiofrequency ablation  Plan of Care  Pharmacotherapy (Medications  Ordered): No orders of the defined types were placed in this encounter.  Lab-work, procedure(s), and/or referral(s): Orders Placed This Encounter  Procedures  . LUMBAR FACET(MEDIAL BRANCH NERVE BLOCK) MBNB   Pharmacological management options:  Opioid Analgesics: Non-opioid management only At this time. Will consider tramadol or Nucynta if interventional options in non-opioid analgesics are not effective.    Membrane stabilizer: To be determined at a later time consider gabapentin, Lyrica   Muscle relaxant: To be determined at a later time  has tried Flexeril, can consider tizanidine, baclofen, Robaxin  NSAID: To be determined at a later time is tried naproxen in the past. Can consider Mobic, diclofenac   Other analgesic(s): To be determined at a later time TENS, Cymbalta, topicals, TCAs    Interventional management options: Mr. Quang was informed that there is no guarantee that he would be a candidate for interventional therapies. The decision will be based on the results of diagnostic studies, as well as Mr. Ambrosia's risk profile.  Procedure(s) under consideration:  -Bilateral diagnostic lumbar medial branch nerve blocks -Sacroiliac joint injection -Trigger point injections -Lumbar epidural steroid injection    Provider-requested follow-up: Return for Procedure.  Future Appointments Date Time Provider Department Center  09/20/2017 2:00 PM , , MD [facility] None  09/21/2017 2:45 PM Wohl, Darren, MD [facility] None  08/08/2018 11:00 AM Lada, Melinda P, MD [facility] None    Primary Care Physician: Lada, Melinda P, MD Location: [facility] Note   by:  , M.D Date: 09/20/2017; Time: 1:36 PM  Patient Instructions  1. Schedule for diagnostic lumbar facet block #2 with sedation. 2. Decrease amitriptyline to 25 mg in the evening.Pain Management Discharge Instructions  General Discharge Instructions :  If you need to reach your doctor  call: Monday-Friday 8:00 am - 4:00 pm at [phone] or toll free [phone].  After clinic hours [phone] to have operator reach doctor.  Bring all of your medication bottles to all your appointments in the pain clinic.  To cancel or reschedule your appointment with Pain Management please remember to call 24 hours in advance to avoid a fee.  Refer to the educational materials which you have been given on: General Risks, I had my Procedure. Discharge Instructions, Post Sedation.  Post Procedure Instructions:  The drugs you were given will stay in your system until tomorrow, so for the next 24 hours you should not drive, make any legal decisions or drink any alcoholic beverages.  You may eat anything you prefer, but it is better to start with liquids then soups and crackers, and gradually work up to solid foods.  Please notify your doctor immediately if you have any unusual bleeding, trouble breathing or pain that is not related to your normal pain.  Depending on the type of procedure that was done, some parts of your body may feel week and/or numb.  This usually clears up by tonight or the next day.  Walk with the use of an assistive device or accompanied by an adult for the 24 hours.  You may use ice on the affected area for the first 24 hours.  Put ice in a Ziploc bag and cover with a towel and place against area 15 minutes on 15 minutes off.  You may switch to heat after 24 hours. Facet Joint Block The facet joints connect the bones of the spine (vertebrae). They make it possible for you to bend, twist, and make other movements with your spine. They also keep you from bending too far, twisting too far, and making other excessive movements. A facet joint block is a procedure where a numbing medicine (anesthetic) is injected into a facet joint. Often, a type of anti-inflammatory medicine called a steroid is also injected. A facet joint block may be done to diagnose neck or back pain.  If the pain gets better after a facet joint block, it means the pain is probably coming from the facet joint. If the pain does not get better, it means the pain is probably not coming from the facet joint. A facet joint block may also be done to relieve neck or back pain caused by an inflamed facet joint. A facet joint block is only done to relieve pain if the pain does not improve with other methods, such as medicine, exercise programs, and physical therapy. Tell a health care provider about:  Any allergies you have.  All medicines you are taking, including vitamins, herbs, eye drops, creams, and over-the-counter medicines.  Any problems you or family members have had with anesthetic medicines.  Any blood disorders you have.  Any surgeries you have had.  Any medical conditions you have.  Whether you are pregnant or may be pregnant. What are the risks? Generally, this is a safe procedure. However, problems may occur, including:  Bleeding.  Injury to a nerve near the injection site.  Pain at the injection site.  Weakness or numbness in areas controlled by nerves near the injection site.  Infection.    Temporary fluid retention.  Allergic reactions to medicines or dyes.  Injury to other structures or organs near the injection site.  What happens before the procedure?  Follow instructions from your health care provider about eating or drinking restrictions.  Ask your health care provider about: ? Changing or stopping your regular medicines. This is especially important if you are taking diabetes medicines or blood thinners. ? Taking medicines such as aspirin and ibuprofen. These medicines can thin your blood. Do not take these medicines before your procedure if your health care provider instructs you not to.  Do not take any new dietary supplements or medicines without asking your health care provider first.  Plan to have someone take you home after the procedure. What  happens during the procedure?  You may need to remove your clothing and dress in an open-back gown.  The procedure will be done while you are lying on an X-ray table. You will most likely be asked to lie on your stomach, but you may be asked to lie in a different position if an injection will be made in your neck.  Machines will be used to monitor your oxygen levels, heart rate, and blood pressure.  If an injection will be made in your neck, an IV tube will be inserted into one of your veins. Fluids and medicine will flow directly into your body through the IV tube.  The area over the facet joint where the injection will be made will be cleaned with soap. The surrounding skin will be covered with clean drapes.  A numbing medicine (local anesthetic) will be applied to your skin. Your skin may sting or burn for a moment.  A video X-ray machine (fluoroscopy) will be used to locate the joint. In some cases, a CT scan may be used.  A contrast dye may be injected into the facet joint area to help locate the joint.  When the joint is located, an anesthetic will be injected into the joint through the needle.  Your health care provider will ask you whether you feel pain relief. If you do feel relief, a steroid may be injected to provide pain relief for a longer period of time. If you do not feel relief or feel only partial relief, additional injections of an anesthetic may be made in other facet joints.  The needle will be removed.  Your skin will be cleaned.  A bandage (dressing) will be applied over each injection site. The procedure may vary among health care providers and hospitals. What happens after the procedure?  You will be observed for 15-30 minutes before being allowed to go home. This information is not intended to replace advice given to you by your health care provider. Make sure you discuss any questions you have with your health care provider. Document Released: 05/03/2007  Document Revised: 01/13/2016 Document Reviewed: 09/07/2015 Elsevier Interactive Patient Education  2018 [employer]   

## 2017-09-20 NOTE — Progress Notes (Signed)
Safety precautions to be maintained throughout the outpatient stay will include: orient to surroundings, keep bed in low position, maintain call bell within reach at all times, provide assistance with transfer out of bed and ambulation.  

## 2017-09-21 ENCOUNTER — Encounter: Payer: Self-pay | Admitting: Gastroenterology

## 2017-09-21 ENCOUNTER — Ambulatory Visit (INDEPENDENT_AMBULATORY_CARE_PROVIDER_SITE_OTHER): Payer: 59 | Admitting: Gastroenterology

## 2017-09-21 DIAGNOSIS — R748 Abnormal levels of other serum enzymes: Secondary | ICD-10-CM | POA: Diagnosis not present

## 2017-09-21 NOTE — Progress Notes (Signed)
Gastroenterology Consultation  Referring Provider:     Arnetha Courser, MD Primary Care Physician:  Arnetha Courser, MD Primary Gastroenterologist:  Dr. Allen Norris     Reason for Consultation:     Abnormal liver enzymes        HPI:   John Hartman is a 46 y.o. y/o male referred for consultation & management of Abnormal liver enzymes by Dr. Sanda Klein, Satira Anis, MD.  This patient comes in today with a history of abnormal liver enzymes.  The patient's liver enzymes have increased recently and the patient states he has been diagnosed with diabetes.  The patient also reports that he is been unable to work out thereby gaining more weight because of back problems.  The patient has had multiple back injections and reports that he is also supposed to have ablation of his back nerves II stop the back pain.  The patient denies any alcohol abuse.  He is also being treated for hyperlipidemia.  There is no report of any jaundice dark urine light stools or abdominal pain.  Past Medical History:  Diagnosis Date  . Angioedema   . DDD (degenerative disc disease), cervical   . Diabetes mellitus ([place])   . Fatty liver 03/31/2017   [place] April 2018  . GERD (gastroesophageal reflux disease)   . Gout   . HTN (hypertension)   . Hypertension    CONTROLLED ON MEDS  . IBS (irritable bowel syndrome)   . Seasonal allergies   . Spinal stenosis     Past Surgical History:  Procedure Laterality Date  . ACDF with fusion  2007  . CARDIAC CATHETERIZATION Left 08/15/2016   Procedure: Left Heart Cath and Coronary Angiography;  Surgeon: Wellington Hampshire, MD;  Location: [facility];  Service: Cardiovascular;  Laterality: Left;  . COLONOSCOPY WITH PROPOFOL N/A 06/26/2015   Procedure: COLONOSCOPY WITH PROPOFOL;  Surgeon: Lucilla Lame, MD;  Location: [facility];  Service: Endoscopy;  Laterality: N/A;  with biopsies  . NASAL SEPTUM SURGERY  2004  . SHOULDER SURGERY Left 2007  . Testicular torsion  1980s  . VASECTOMY       Prior to Admission medications   Medication Sig Start Date End Date Taking? Authorizing Provider  ACCU-CHEK SOFTCLIX LANCETS lancets USE AS DIRECTED 07/23/16  Yes Lada, Satira Anis, MD  allopurinol (ZYLOPRIM) 100 MG tablet Take 100 mg by mouth every morning.    Yes [provider]  amitriptyline (ELAVIL) 25 MG tablet 25 mg qhs x 2 weeks, then 50 mg qhs 08/24/17  Yes Lateef, Bilal, MD  belladonna-PHENObarbital (DONNATAL) 16.2 MG/5ML ELIX Take 5 mLs (16.2 mg total) by mouth 4 (four) times daily as needed for cramping or pain. Patient taking differently: Take 5 mLs by mouth 4 (four) times daily as needed for cramping or pain.  05/28/15  Yes Lucilla Lame, MD  cetirizine (ZYRTEC) 10 MG tablet Take 10 mg by mouth at bedtime.    Yes [provider]  colchicine 0.6 MG tablet Take 0.6 mg by mouth daily as needed.   Yes [provider]  cyclobenzaprine (FLEXERIL) 10 MG tablet TAKE 1 TABLET(10 MG) BY MOUTH EVERY 8 HOURS AS NEEDED FOR MUSCLE SPASMS 05/03/17  Yes Lada, Satira Anis, MD  diphenhydrAMINE (BENADRYL) 25 mg capsule Take 25 mg by mouth at bedtime.    Yes [provider]  EPINEPHrine (EPIPEN 2-PAK) 0.3 mg/0.3 mL IJ SOAJ injection Inject 0.3 mg into the muscle once.   Yes [provider]  glucose blood (ACCU-CHEK ACTIVE STRIPS) test strip Use as instructed 07/09/16  Yes Lada, Satira Anis, MD  glucose blood (ACCU-CHEK AVIVA PLUS) test strip Check FSBS three to four times per day 07/23/16  Yes Lada, Satira Anis, MD  Insulin Degludec (TRESIBA FLEXTOUCH) 100 UNIT/ML SOPN Inject 20 Units into the skin at bedtime.  07/12/16  Yes Lada, Satira Anis, MD  Insulin Pen Needle (B-D UF III MINI PEN NEEDLES) 31G X 5 MM MISC Once a day with insulin 07/09/16  Yes Lada, Satira Anis, MD  Melatonin 5 MG TABS Take 5 mg by mouth at bedtime.    Yes [provider]  metoprolol tartrate (LOPRESSOR) 25 MG tablet TAKE 1 TABLET(25 MG) BY MOUTH TWICE DAILY 02/13/17  Yes Lada, Satira Anis, MD   naproxen sodium (ANAPROX) 220 MG tablet Take 1-2 tablets (220-440 mg total) by mouth 2 (two) times daily with a meal. 08/04/17  Yes Lada, Satira Anis, MD  ranitidine (ZANTAC) 150 MG capsule Take 150 mg by mouth at bedtime.    Yes [provider]  metFORMIN (GLUCOPHAGE-XR) 500 MG 24 hr tablet Take 1,500 mg by mouth every evening. Take with supper. 07/15/16 07/15/17  [provider]    Family History  Problem Relation Age of Onset  . Breast cancer Mother   . Skin cancer Mother   . Arrhythmia Mother        A-fib  . Cancer Mother        breast, colon?, skin  . Hypertension Father   . Diabetes Father   . Cancer Maternal Grandfather   . Heart disease Brother        stent  . Allergies Son   . Alzheimer's disease Maternal Grandmother   . Stroke Neg Hx   . COPD Neg Hx      Social History  Substance Use Topics  . Smoking status: Former Smoker    Packs/day: 1.00    Years: 10.00    Types: Cigarettes    Quit date: 04/15/2015  . Smokeless tobacco: Never Used  . Alcohol use No    Allergies as of 09/21/2017 - Review Complete 09/21/2017  Allergen Reaction Noted  . Lisinopril Swelling 10/07/2016  . Ace inhibitors Swelling 08/28/2016    Review of Systems:    All systems reviewed and negative except where noted in HPI.   Physical Exam:  There were no vitals taken for this visit. No LMP for male patient. Psych:  Alert and cooperative. Normal mood and affect. General:   Alert,  Well-developed, well-nourished, pleasant and cooperative in NAD Head:  Normocephalic and atraumatic. Eyes:  Sclera clear, no icterus.   Conjunctiva pink. Ears:  Normal auditory acuity. Nose:  No deformity, discharge, or lesions. Mouth:  No deformity or lesions,oropharynx pink & moist. Neck:  Supple; no masses or thyromegaly. Lungs:  Respirations even and unlabored.  Clear throughout to auscultation.   No wheezes, crackles, or rhonchi. No acute distress. Heart:  Regular rate and rhythm; no  murmurs, clicks, rubs, or gallops. Abdomen:  Normal bowel sounds.  No bruits.  Soft, non-tender and non-distended without masses, hepatosplenomegaly or hernias noted.  No guarding or rebound tenderness.  Negative Carnett sign.   Rectal:  Deferred.  Msk:  Symmetrical without gross deformities.  Good, equal movement & strength bilaterally. Pulses:  Normal pulses noted. Extremities:  No clubbing or edema.  No cyanosis. Neurologic:  Alert and oriented x3;  grossly normal neurologically. Skin:  Intact without significant lesions or rashes.  No jaundice. Lymph Nodes:  No significant cervical adenopathy. Psych:  Alert and cooperative. Normal mood and affect.  Imaging Studies: Dg C-arm 1-60 Min-no Report  Result Date: 09/13/2017 Fluoroscopy was utilized by the requesting physician.  No radiographic interpretation.    Assessment and Plan:   Yarel Rushlow is a 46 y.o. y/o male Who has been found to have abnormal liver enzymes.  The patient will have his labs sent off for other possible causes of abnormal liver enzymes.  The patient did have a ultrasound of the liver that showed him to have fatty liver.  The patient has been told to try and lose weight and follow up in 3 months to see if his liver enzymes go down with the weight loss.  The patient will also be contacted with any abnormal results of his blood work.  The patient has been explained the plan and agrees with it.  Lucilla Lame, MD. Marval Regal   Note: This dictation was prepared with Dragon dictation along with smaller phrase technology. Any transcriptional errors that result from this process are unintentional.

## 2017-09-22 ENCOUNTER — Encounter: Payer: Self-pay | Admitting: Family Medicine

## 2017-09-22 MED ORDER — ATENOLOL 50 MG PO TABS: 50.0000 mg | ORAL_TABLET | Freq: Every day | ORAL | 3 refills | Status: DC

## 2017-09-23 LAB — IRON AND TIBC
Iron Saturation: 19 % (ref 15–55)
Iron: 63 ug/dL (ref 38–169)
Total Iron Binding Capacity: 333 ug/dL (ref 250–450)
UIBC: 270 ug/dL (ref 111–343)

## 2017-09-23 LAB — HEPATIC FUNCTION PANEL
ALBUMIN: 4.9 g/dL (ref 3.5–5.5)
ALK PHOS: 99 IU/L (ref 39–117)
ALT: 153 IU/L — AB (ref 0–44)
AST: 90 IU/L — AB (ref 0–40)
BILIRUBIN TOTAL: 0.7 mg/dL (ref 0.0–1.2)
BILIRUBIN, DIRECT: 0.18 mg/dL (ref 0.00–0.40)
Total Protein: 7 g/dL (ref 6.0–8.5)

## 2017-09-23 LAB — HEPATITIS B SURFACE ANTIBODY,QUALITATIVE: HEP B SURFACE AB, QUAL: NONREACTIVE

## 2017-09-23 LAB — ANA: Anti Nuclear Antibody(ANA): NEGATIVE

## 2017-09-23 LAB — ANTI-SMOOTH MUSCLE ANTIBODY, IGG: Smooth Muscle Ab: 9 Units (ref 0–19)

## 2017-09-23 LAB — MITOCHONDRIAL ANTIBODIES: MITOCHONDRIAL AB: 5 U (ref 0.0–20.0)

## 2017-09-23 LAB — HEPATITIS B SURFACE ANTIGEN: HEP B S AG: NEGATIVE

## 2017-09-23 LAB — HEPATITIS C ANTIBODY

## 2017-09-23 LAB — ALPHA-1-ANTITRYPSIN: A-1 Antitrypsin: 128 mg/dL (ref 90–200)

## 2017-09-23 LAB — HEPATITIS A ANTIBODY, TOTAL: Hep A Total Ab: NEGATIVE

## 2017-09-23 LAB — FERRITIN: Ferritin: 723 ng/mL — ABNORMAL HIGH (ref 30–400)

## 2017-09-23 LAB — CERULOPLASMIN: CERULOPLASMIN: 21.6 mg/dL (ref 16.0–31.0)

## 2017-09-25 ENCOUNTER — Telehealth: Payer: Self-pay

## 2017-09-25 NOTE — Telephone Encounter (Signed)
-----   Message from Lucilla Lame, MD sent at 09/24/2017  9:20 AM EDT ----- The patient know that all of his blood tests were negative for any other cause of his abnormal liver enzymes.  His liver enzymes have increased since the last blood test.  He should be encouraged to try and lose weight and repeat his liver enzymes in 1 month.

## 2017-09-25 NOTE — Telephone Encounter (Signed)
Pt notified of lab results

## 2017-09-27 ENCOUNTER — Encounter: Payer: Self-pay | Admitting: Student in an Organized Health Care Education/Training Program

## 2017-09-27 ENCOUNTER — Ambulatory Visit
Admission: RE | Admit: 2017-09-27 | Discharge: 2017-09-27 | Disposition: A | Discharge status: Home / Self Care | Payer: 59 | Source: Ambulatory Visit | Attending: Student in an Organized Health Care Education/Training Program | Admitting: Student in an Organized Health Care Education/Training Program

## 2017-09-27 ENCOUNTER — Ambulatory Visit (HOSPITAL_BASED_OUTPATIENT_CLINIC_OR_DEPARTMENT_OTHER): Payer: 59 | Admitting: Student in an Organized Health Care Education/Training Program

## 2017-09-27 VITALS — BP 135/102 | HR 64 | Temp 98.1°F | Resp 13 | Ht 71.0 in | Wt 230.0 lb

## 2017-09-27 DIAGNOSIS — Z79899 Other long term (current) drug therapy: Secondary | ICD-10-CM | POA: Insufficient documentation

## 2017-09-27 DIAGNOSIS — G8929 Other chronic pain: Secondary | ICD-10-CM

## 2017-09-27 DIAGNOSIS — Z888 Allergy status to other drugs, medicaments and biological substances status: Secondary | ICD-10-CM | POA: Diagnosis not present

## 2017-09-27 DIAGNOSIS — Z981 Arthrodesis status: Secondary | ICD-10-CM | POA: Insufficient documentation

## 2017-09-27 DIAGNOSIS — M545 Low back pain, unspecified: Secondary | ICD-10-CM

## 2017-09-27 DIAGNOSIS — M47816 Spondylosis without myelopathy or radiculopathy, lumbar region: Secondary | ICD-10-CM | POA: Diagnosis not present

## 2017-09-27 DIAGNOSIS — M25552 Pain in left hip: Secondary | ICD-10-CM | POA: Diagnosis not present

## 2017-09-27 DIAGNOSIS — Z794 Long term (current) use of insulin: Secondary | ICD-10-CM | POA: Diagnosis not present

## 2017-09-27 DIAGNOSIS — M1288 Other specific arthropathies, not elsewhere classified, other specified site: Secondary | ICD-10-CM | POA: Insufficient documentation

## 2017-09-27 MED ORDER — ROPIVACAINE HCL 2 MG/ML IJ SOLN
INTRAMUSCULAR | Status: AC
  Filled 2017-09-27: qty 10

## 2017-09-27 MED ORDER — ROPIVACAINE HCL 2 MG/ML IJ SOLN
10.0000 mL | Freq: Once | INTRAMUSCULAR | Status: AC
  Administered 2017-09-27: 10 mL
  Filled 2017-09-27: qty 10

## 2017-09-27 MED ORDER — LACTATED RINGERS IV SOLN
1000.0000 mL | Freq: Once | INTRAVENOUS | Status: AC
  Administered 2017-09-27: 1000 mL via INTRAVENOUS

## 2017-09-27 MED ORDER — FENTANYL CITRATE (PF) 100 MCG/2ML IJ SOLN
25.0000 ug | INTRAMUSCULAR | Status: DC | PRN
  Administered 2017-09-27: 50 ug via INTRAVENOUS
  Filled 2017-09-27: qty 2

## 2017-09-27 MED ORDER — LIDOCAINE HCL (PF) 1 % IJ SOLN
10.0000 mL | Freq: Once | INTRAMUSCULAR | Status: AC
  Administered 2017-09-27: 5 mL
  Filled 2017-09-27: qty 10

## 2017-09-27 MED ORDER — TIZANIDINE HCL 4 MG PO TABS: 4.0000 mg | ORAL_TABLET | Freq: Three times a day (TID) | ORAL | 1 refills | Status: DC | PRN

## 2017-09-27 NOTE — Progress Notes (Signed)
Safety precautions to be maintained throughout the outpatient stay will include: orient to surroundings, keep bed in low position, maintain call bell within reach at all times, provide assistance with transfer out of bed and ambulation.  

## 2017-09-27 NOTE — Progress Notes (Signed)
Patient's Name: John Hartman  MRN: [id number]  Referring Provider: Gillis Santa, MD  DOB: [birth year]  PCP: Arnetha Courser, MD  DOS: 09/27/2017  Note by: Gillis Santa, MD  Service setting: Ambulatory outpatient  Specialty: Interventional Pain Management  Patient type: Established  Location: [facility] (AMB) Pain Management Facility  Visit type: Interventional Procedure   Primary Reason for Visit: Interventional Pain Management Treatment. CC: Back Pain (low and mid) and Hip Pain (left)  Procedure:  Anesthesia, Analgesia, Anxiolysis:  Type: Diagnostic Medial Branch Facet Block #2 Region: Lumbar Level: L3, L4, L5,  Medial Branch Level(s) Laterality: Bilateral  Type: Local Anesthesia with Moderate (Conscious) Sedation Local Anesthetic: Lidocaine 1% Route: Intravenous (IV) IV Access: Secured Sedation: Meaningful verbal contact was maintained at all times during the procedure  Indication(s): Analgesia and Anxiety   Indications: 1. Lumbar facet arthropathy   2. Spondylosis of lumbar region without myelopathy or radiculopathy   3. Chronic bilateral low back pain without sciatica    Pain Score: Pre-procedure: 8 /10 Post-procedure: 0-No pain/10  Pre-op Assessment:  John Hartman is a 46 y.o. (year old), male patient, seen today for interventional treatment. He  has a past surgical history that includes Shoulder surgery (Left, 2007); Nasal septum surgery (2004); ACDF with fusion (2007); Testicular torsion (1980s); Vasectomy; Colonoscopy with propofol (N/A, 06/26/2015); and Cardiac catheterization (Left, 08/15/2016). John Hartman has a current medication list which includes the following prescription(s): accu-chek softclix lancets, allopurinol, amitriptyline, atenolol, belladonna-phenobarbital, cetirizine, colchicine, cyclobenzaprine, diphenhydramine, epinephrine, glucose blood, glucose blood, insulin degludec, insulin pen needle, melatonin, naproxen sodium, ranitidine, metformin, and tizanidine, and the following  Facility-Administered Medications: fentanyl. His primarily concern today is the Back Pain (low and mid) and Hip Pain (left)  Initial Vital Signs: There were no vitals taken for this visit. BMI: Estimated body mass index is 32.08 kg/m as calculated from the following:   Height as of this encounter: 5\' 11"  (1.803 m).   Weight as of this encounter: 230 lb (104.3 kg).  Risk Assessment: Allergies: Reviewed. He is allergic to lisinopril and ace inhibitors.  Allergy Precautions: None required Coagulopathies: Reviewed. None identified.  Blood-thinner therapy: None at this time Active Infection(s): Reviewed. None identified. John Hartman is afebrile  Site Confirmation: John Hartman was asked to confirm the procedure and laterality before marking the site Procedure checklist: Completed Consent: Before the procedure and under the influence of no sedative(s), amnesic(s), or anxiolytics, the patient was informed of the treatment options, risks and possible complications. To fulfill our ethical and legal obligations, as recommended by the American Medical Association's Code of Ethics, I have informed the patient of my clinical impression; the nature and purpose of the treatment or procedure; the risks, benefits, and possible complications of the intervention; the alternatives, including doing nothing; the risk(s) and benefit(s) of the alternative treatment(s) or procedure(s); and the risk(s) and benefit(s) of doing nothing. The patient was provided information about the general risks and possible complications associated with the procedure. These may include, but are not limited to: failure to achieve desired goals, infection, bleeding, organ or nerve damage, allergic reactions, paralysis, and death. In addition, the patient was informed of those risks and complications associated to Spine-related procedures, such as failure to decrease pain; infection (i.e.: Meningitis, epidural or intraspinal abscess); bleeding  (i.e.: epidural hematoma, subarachnoid hemorrhage, or any other type of intraspinal or peri-dural bleeding); organ or nerve damage (i.e.: Any type of peripheral nerve, nerve root, or spinal cord injury) with subsequent damage to sensory, motor, and/or autonomic systems,  resulting in permanent pain, numbness, and/or weakness of one or several areas of the body; allergic reactions; (i.e.: anaphylactic reaction); and/or death. Furthermore, the patient was informed of those risks and complications associated with the medications. These include, but are not limited to: allergic reactions (i.e.: anaphylactic or anaphylactoid reaction(s)); adrenal axis suppression; blood sugar elevation that in diabetics may result in ketoacidosis or comma; water retention that in patients with history of congestive heart failure may result in shortness of breath, pulmonary edema, and decompensation with resultant heart failure; weight gain; swelling or edema; medication-induced neural toxicity; particulate matter embolism and blood vessel occlusion with resultant organ, and/or nervous system infarction; and/or aseptic necrosis of one or more joints. Finally, the patient was informed that Medicine is not an exact science; therefore, there is also the possibility of unforeseen or unpredictable risks and/or possible complications that may result in a catastrophic outcome. The patient indicated having understood very clearly. We have given the patient no guarantees and we have made no promises. Enough time was given to the patient to ask questions, all of which were answered to the patient's satisfaction. John Hartman has indicated that he wanted to continue with the procedure. Attestation: I, the ordering provider, attest that I have discussed with the patient the benefits, risks, side-effects, alternatives, likelihood of achieving goals, and potential problems during recovery for the procedure that I have provided informed consent. Date:  09/27/2017; Time: 8:36 AM  Pre-Procedure Preparation:  Monitoring: As per clinic protocol. Respiration, ETCO2, SpO2, BP, heart rate and rhythm monitor placed and checked for adequate function Safety Precautions: Patient was assessed for positional comfort and pressure points before starting the procedure. Time-out: I initiated and conducted the "Time-out" before starting the procedure, as per protocol. The patient was asked to participate by confirming the accuracy of the "Time Out" information. Verification of the correct person, site, and procedure were performed and confirmed by me, the nursing staff, and the patient. "Time-out" conducted as per Joint Commission's Universal Protocol (UP.01.01.01). "Time-out" Date & Time: 09/27/2017; 0912 hrs.  Description of Procedure Process:   Position: Prone Target Area: For Lumbar Facet blocks, the target is the groove formed by the junction of the transverse process and superior articular process. For the L5 dorsal ramus, the target is the notch between superior articular process and sacral ala. Approach: Paramedial approach. Area Prepped: Entire Posterior Lumbosacral Region Prepping solution: ChloraPrep (2% chlorhexidine gluconate and 70% isopropyl alcohol) Safety Precautions: Aspiration looking for blood return was conducted prior to all injections. At no point did we inject any substances, as a needle was being advanced. No attempts were made at seeking any paresthesias. Safe injection practices and needle disposal techniques used. Medications properly checked for expiration dates. SDV (single dose vial) medications used. Description of the Procedure: Protocol guidelines were followed. The patient was placed in position over the fluoroscopy table. The target area was identified and the area prepped in the usual manner. Skin desensitized using vapocoolant spray. Skin & deeper tissues infiltrated with local anesthetic. Appropriate amount of time allowed to pass  for local anesthetics to take effect. The procedure needle was introduced through the skin, ipsilateral to the reported pain, and advanced to the target area. Employing the "Medial Branch Technique", the needles were advanced to the angle made by the superior and medial portion of the transverse process, and the lateral and inferior portion of the superior articulating process of the targeted vertebral bodies. This area is known as "Burton's Eye" or the "Eye of  the [place] Dog". A procedure needle was introduced through the skin, and this time advanced to the angle made by the superior and medial border of the sacral ala, and the lateral border of the S1 vertebral body. Negative aspiration confirmed. Solution injected in intermittent fashion, asking for systemic symptoms every 0.5cc of injectate. The needles were then removed and the area cleansed, making sure to leave some of the prepping solution back to take advantage of its long term bactericidal properties.   Illustration of the posterior view of the lumbar spine and the posterior neural structures. Laminae of L2 through S1 are labeled. DPRL5, dorsal primary ramus of L5; DPRS1, dorsal primary ramus of S1; DPR3, dorsal primary ramus of L3; FJ, facet (zygapophyseal) joint L3-L4; I, inferior articular process of L4; LB1, lateral branch of dorsal primary ramus of L1; IAB, inferior articular branches from L3 medial branch (supplies L4-L5 facet joint); IBP, intermediate branch plexus; MB3, medial branch of dorsal primary ramus of L3; NR3, third lumbar nerve root; S, superior articular process of L5; SAB, superior articular branches from L4 (supplies L4-5 facet joint also); TP3, transverse process of L3.  Vitals:   09/27/17 0929 09/27/17 0938 09/27/17 0948 09/27/17 0958  BP: (!) 136/99 (!) 134/102 (!) 139/102 (!) 135/102  Pulse: 64 67 61 64  Resp: 15 16 12 13   Temp:      TempSrc:      SpO2: 98% 96% 98% 99%  Weight:      Height:        Start Time: 0913  hrs. End Time: 0928 hrs. Materials:  Needle(s) Type: Regular needle Gauge: 22G Length: 3.5-in Medication(s): We administered lactated ringers, fentaNYL, ropivacaine (PF) 2 mg/mL (0.2%), and lidocaine (PF). Please see chart orders for dosing details. 1.5 cc ropivacaine at each level. Imaging Guidance (Spinal):  Type of Imaging Technique: Fluoroscopy Guidance (Spinal) Indication(s): Assistance in needle guidance and placement for procedures requiring needle placement in or near specific anatomical locations not easily accessible without such assistance. Exposure Time: Please see nurses notes. Contrast: None used. Fluoroscopic Guidance: I was personally present during the use of fluoroscopy. "Tunnel Vision Technique" used to obtain the best possible view of the target area. Parallax error corrected before commencing the procedure. "Direction-depth-direction" technique used to introduce the needle under continuous pulsed fluoroscopy. Once target was reached, antero-posterior, oblique, and lateral fluoroscopic projection used confirm needle placement in all planes. Images permanently stored in EMR. Interpretation: No contrast injected. I personally interpreted the imaging intraoperatively. Adequate needle placement confirmed in multiple planes. Permanent images saved into the patient's record.  Antibiotic Prophylaxis:  Indication(s): None identified Antibiotic given: None  Post-operative Assessment:  EBL: None Complications: No immediate post-treatment complications observed by team, or reported by patient. Note: The patient tolerated the entire procedure well. A repeat set of vitals were taken after the procedure and the patient was kept under observation following institutional policy, for this type of procedure. Post-procedural neurological assessment was performed, showing return to baseline, prior to discharge. The patient was provided with post-procedure discharge instructions, including a  section on how to identify potential problems. Should any problems arise concerning this procedure, the patient was given instructions to immediately contact us, at any time, without hesitation. In any case, we plan to contact the patient by telephone for a follow-up status report regarding this interventional procedure. Comments:  No additional relevant information.  Plan of Care   Imaging Orders     DG C-Arm 1-60 Min-No Report Procedure Orders  No procedure(s) ordered today     Medications ordered for procedure: Meds ordered this encounter  Medications  . lactated ringers infusion 1,000 mL  . fentaNYL (SUBLIMAZE) injection 25-50 mcg    Make sure Narcan is available in the pyxis when using this medication. In the event of respiratory depression (RR< 8/min): Titrate NARCAN (naloxone) in increments of 0.1 to 0.2 mg IV at 2-3 minute intervals, until desired degree of reversal.  . ropivacaine (PF) 2 mg/mL (0.2%) (NAROPIN) injection 10 mL  . lidocaine (PF) (XYLOCAINE) 1 % injection 10 mL  . tiZANidine (ZANAFLEX) 4 MG tablet    Sig: Take 1 tablet (4 mg total) by mouth every 8 (eight) hours as needed for muscle spasms.    Dispense:  90 tablet    Refill:  1   Medications administered: We administered lactated ringers, fentaNYL, ropivacaine (PF) 2 mg/mL (0.2%), and lidocaine (PF).  See the medical record for exact dosing, route, and time of administration.  New Prescriptions   TIZANIDINE (ZANAFLEX) 4 MG TABLET    Take 1 tablet (4 mg total) by mouth every 8 (eight) hours as needed for muscle spasms.   Disposition: Discharge home  Discharge Date & Time: 09/27/2017; 1001 hrs.   Physician-requested Follow-up: Return in about 1 week (around 10/04/2017) for Post Procedure Evaluation. Future Appointments Date Time Provider [place]  10/04/2017 2:00 PM Gillis Santa, MD [facility] None  08/08/2018 11:00 AM Lada, Satira Anis, MD [place] None   Primary Care Physician: Arnetha Courser,  MD Location: [facility] Note by: Gillis Santa, MD Date: 09/27/2017; Time: 2:01 PM  Disclaimer:  Medicine is not an exact science. The only guarantee in medicine is that nothing is guaranteed. It is important to note that the decision to proceed with this intervention was based on the information collected from the patient. The Data and conclusions were drawn from the patient's questionnaire, the interview, and the physical examination. Because the information was provided in large part by the patient, it cannot be guaranteed that it has not been purposely or unconsciously manipulated. Every effort has been made to obtain as much relevant data as possible for this evaluation. It is important to note that the conclusions that lead to this procedure are derived in large part from the available data. Always take into account that the treatment will also be dependent on availability of resources and existing treatment guidelines, considered by other Pain Management Practitioners as being common knowledge and practice, at the time of the intervention. For Medico-Legal purposes, it is also important to point out that variation in procedural techniques and pharmacological choices are the acceptable norm. The indications, contraindications, technique, and results of the above procedure should only be interpreted and judged by a Board-Certified Interventional Pain Specialist with extensive familiarity and expertise in the same exact procedure and technique.

## 2017-09-27 NOTE — Patient Instructions (Signed)

## 2017-09-28 ENCOUNTER — Ambulatory Visit: Payer: 59 | Admitting: Student in an Organized Health Care Education/Training Program

## 2017-09-28 ENCOUNTER — Telehealth: Payer: Self-pay | Admitting: *Deleted

## 2017-09-28 NOTE — Telephone Encounter (Signed)
Attempted to call for post procedure follow-up. Message left. 

## 2017-10-04 ENCOUNTER — Ambulatory Visit
Payer: 59 | Attending: Student in an Organized Health Care Education/Training Program | Admitting: Student in an Organized Health Care Education/Training Program

## 2017-10-04 ENCOUNTER — Encounter: Payer: Self-pay | Admitting: Student in an Organized Health Care Education/Training Program

## 2017-10-04 VITALS — BP 162/96 | HR 80 | Temp 98.3°F | Resp 18 | Ht 71.0 in | Wt 230.0 lb

## 2017-10-04 DIAGNOSIS — K760 Fatty (change of) liver, not elsewhere classified: Secondary | ICD-10-CM | POA: Insufficient documentation

## 2017-10-04 DIAGNOSIS — I700 Atherosclerosis of aorta: Secondary | ICD-10-CM | POA: Diagnosis not present

## 2017-10-04 DIAGNOSIS — R740 Nonspecific elevation of levels of transaminase and lactic acid dehydrogenase [LDH]: Secondary | ICD-10-CM | POA: Insufficient documentation

## 2017-10-04 DIAGNOSIS — M5030 Other cervical disc degeneration, unspecified cervical region: Secondary | ICD-10-CM | POA: Insufficient documentation

## 2017-10-04 DIAGNOSIS — K219 Gastro-esophageal reflux disease without esophagitis: Secondary | ICD-10-CM | POA: Diagnosis not present

## 2017-10-04 DIAGNOSIS — E119 Type 2 diabetes mellitus without complications: Secondary | ICD-10-CM | POA: Insufficient documentation

## 2017-10-04 DIAGNOSIS — Z833 Family history of diabetes mellitus: Secondary | ICD-10-CM | POA: Diagnosis not present

## 2017-10-04 DIAGNOSIS — I2510 Atherosclerotic heart disease of native coronary artery without angina pectoris: Secondary | ICD-10-CM | POA: Diagnosis not present

## 2017-10-04 DIAGNOSIS — Z808 Family history of malignant neoplasm of other organs or systems: Secondary | ICD-10-CM | POA: Diagnosis not present

## 2017-10-04 DIAGNOSIS — E785 Hyperlipidemia, unspecified: Secondary | ICD-10-CM | POA: Diagnosis not present

## 2017-10-04 DIAGNOSIS — M5136 Other intervertebral disc degeneration, lumbar region: Secondary | ICD-10-CM | POA: Diagnosis not present

## 2017-10-04 DIAGNOSIS — Z794 Long term (current) use of insulin: Secondary | ICD-10-CM | POA: Insufficient documentation

## 2017-10-04 DIAGNOSIS — Z87891 Personal history of nicotine dependence: Secondary | ICD-10-CM | POA: Diagnosis not present

## 2017-10-04 DIAGNOSIS — Z981 Arthrodesis status: Secondary | ICD-10-CM | POA: Insufficient documentation

## 2017-10-04 DIAGNOSIS — Z888 Allergy status to other drugs, medicaments and biological substances status: Secondary | ICD-10-CM | POA: Insufficient documentation

## 2017-10-04 DIAGNOSIS — Z803 Family history of malignant neoplasm of breast: Secondary | ICD-10-CM | POA: Diagnosis not present

## 2017-10-04 DIAGNOSIS — M47816 Spondylosis without myelopathy or radiculopathy, lumbar region: Secondary | ICD-10-CM | POA: Diagnosis not present

## 2017-10-04 DIAGNOSIS — M545 Low back pain, unspecified: Secondary | ICD-10-CM

## 2017-10-04 DIAGNOSIS — K589 Irritable bowel syndrome without diarrhea: Secondary | ICD-10-CM | POA: Insufficient documentation

## 2017-10-04 DIAGNOSIS — G8929 Other chronic pain: Secondary | ICD-10-CM

## 2017-10-04 DIAGNOSIS — Z5181 Encounter for therapeutic drug level monitoring: Secondary | ICD-10-CM | POA: Insufficient documentation

## 2017-10-04 DIAGNOSIS — R079 Chest pain, unspecified: Secondary | ICD-10-CM | POA: Insufficient documentation

## 2017-10-04 DIAGNOSIS — M1288 Other specific arthropathies, not elsewhere classified, other specified site: Secondary | ICD-10-CM | POA: Insufficient documentation

## 2017-10-04 DIAGNOSIS — Z79899 Other long term (current) drug therapy: Secondary | ICD-10-CM | POA: Diagnosis not present

## 2017-10-04 DIAGNOSIS — I10 Essential (primary) hypertension: Secondary | ICD-10-CM | POA: Insufficient documentation

## 2017-10-04 DIAGNOSIS — M109 Gout, unspecified: Secondary | ICD-10-CM | POA: Insufficient documentation

## 2017-10-04 DIAGNOSIS — D127 Benign neoplasm of rectosigmoid junction: Secondary | ICD-10-CM | POA: Insufficient documentation

## 2017-10-04 DIAGNOSIS — Z8249 Family history of ischemic heart disease and other diseases of the circulatory system: Secondary | ICD-10-CM | POA: Insufficient documentation

## 2017-10-04 DIAGNOSIS — Z800 Family history of malignant neoplasm of digestive organs: Secondary | ICD-10-CM | POA: Insufficient documentation

## 2017-10-04 DIAGNOSIS — M51369 Other intervertebral disc degeneration, lumbar region without mention of lumbar back pain or lower extremity pain: Secondary | ICD-10-CM

## 2017-10-04 DIAGNOSIS — R002 Palpitations: Secondary | ICD-10-CM | POA: Insufficient documentation

## 2017-10-04 DIAGNOSIS — R748 Abnormal levels of other serum enzymes: Secondary | ICD-10-CM | POA: Insufficient documentation

## 2017-10-04 MED ORDER — ORPHENADRINE CITRATE 30 MG/ML IJ SOLN
60.0000 mg | Freq: Once | INTRAMUSCULAR | Status: AC
  Administered 2017-10-04: 60 mg via INTRAMUSCULAR

## 2017-10-04 MED ORDER — KETOROLAC TROMETHAMINE 60 MG/2ML IM SOLN
60.0000 mg | Freq: Once | INTRAMUSCULAR | Status: AC
  Administered 2017-10-04: 60 mg via INTRAMUSCULAR

## 2017-10-04 MED ORDER — ORPHENADRINE CITRATE 30 MG/ML IJ SOLN
INTRAMUSCULAR | Status: AC
  Filled 2017-10-04: qty 2

## 2017-10-04 MED ORDER — KETOROLAC TROMETHAMINE 60 MG/2ML IM SOLN
INTRAMUSCULAR | Status: AC
  Filled 2017-10-04: qty 2

## 2017-10-04 NOTE — Patient Instructions (Signed)
GENERAL RISKS AND COMPLICATIONS  What are the risk, side effects and possible complications? Generally speaking, most procedures are safe.  However, with any procedure there are risks, side effects, and the possibility of complications.  The risks and complications are dependent upon the sites that are lesioned, or the type of nerve block to be performed.  The closer the procedure is to the spine, the more serious the risks are.  Great care is taken when placing the radio frequency needles, block needles or lesioning probes, but sometimes complications can occur. 1. Infection: Any time there is an injection through the skin, there is a risk of infection.  This is why sterile conditions are used for these blocks.  There are four possible types of infection. 1. Localized skin infection. 2. Central Nervous System Infection-This can be in the form of Meningitis, which can be deadly. 3. Epidural Infections-This can be in the form of an epidural abscess, which can cause pressure inside of the spine, causing compression of the spinal cord with subsequent paralysis. This would require an emergency surgery to decompress, and there are no guarantees that the patient would recover from the paralysis. 4. Discitis-This is an infection of the intervertebral discs.  It occurs in about 1% of discography procedures.  It is difficult to treat and it may lead to surgery.        2. Pain: the needles have to go through skin and soft tissues, will cause soreness.       3. Damage to internal structures:  The nerves to be lesioned may be near blood vessels or    other nerves which can be potentially damaged.       4. Bleeding: Bleeding is more common if the patient is taking blood thinners such as  aspirin, Coumadin, Ticiid, Plavix, etc., or if he/she have some genetic predisposition  such as hemophilia. Bleeding into the spinal canal can cause compression of the spinal  cord with subsequent paralysis.  This would require an  emergency surgery to  decompress and there are no guarantees that the patient would recover from the  paralysis.       5. Pneumothorax:  Puncturing of a lung is a possibility, every time a needle is introduced in  the area of the chest or upper back.  Pneumothorax refers to free air around the  collapsed lung(s), inside of the thoracic cavity (chest cavity).  Another two possible  complications related to a similar event would include: Hemothorax and Chylothorax.   These are variations of the Pneumothorax, where instead of air around the collapsed  lung(s), you may have blood or chyle, respectively.       6. Spinal headaches: They may occur with any procedures in the area of the spine.       7. Persistent CSF (Cerebro-Spinal Fluid) leakage: This is a rare problem, but may occur  with prolonged intrathecal or epidural catheters either due to the formation of a fistulous  track or a dural tear.       8. Nerve damage: By working so close to the spinal cord, there is always a possibility of  nerve damage, which could be as serious as a permanent spinal cord injury with  paralysis.       9. Death:  Although rare, severe deadly allergic reactions known as "Anaphylactic  reaction" can occur to any of the medications used.      10. Worsening of the symptoms:  We can always make thing worse.    What are the chances of something like this happening? Chances of any of this occuring are extremely low.  By statistics, you have more of a chance of getting killed in a motor vehicle accident: while driving to the hospital than any of the above occurring .  Nevertheless, you should be aware that they are possibilities.  In general, it is similar to taking a shower.  Everybody knows that you can slip, hit your head and get killed.  Does that mean that you should not shower again?  Nevertheless always keep in mind that statistics do not mean anything if you happen to be on the wrong side of them.  Even if a procedure has a 1  (one) in a 1,000,000 (million) chance of going wrong, it you happen to be that one..Also, keep in mind that by statistics, you have more of a chance of having something go wrong when taking medications.  Who should not have this procedure? If you are on a blood thinning medication (e.g. Coumadin, Plavix, see list of "Blood Thinners"), or if you have an active infection going on, you should not have the procedure.  If you are taking any blood thinners, please inform your physician.  How should I prepare for this procedure?  Do not eat or drink anything at least six hours prior to the procedure.  Bring a driver with you .  It cannot be a taxi.  Come accompanied by an adult that can drive you back, and that is strong enough to help you if your legs get weak or numb from the local anesthetic.  Take all of your medicines the morning of the procedure with just enough water to swallow them.  If you have diabetes, make sure that you are scheduled to have your procedure done first thing in the morning, whenever possible.  If you have diabetes, take only half of your insulin dose and notify our nurse that you have done so as soon as you arrive at the clinic.  If you are diabetic, but only take blood sugar pills (oral hypoglycemic), then do not take them on the morning of your procedure.  You may take them after you have had the procedure.  Do not take aspirin or any aspirin-containing medications, at least eleven (11) days prior to the procedure.  They may prolong bleeding.  Wear loose fitting clothing that may be easy to take off and that you would not mind if it got stained with Betadine or blood.  Do not wear any jewelry or perfume  Remove any nail coloring.  It will interfere with some of our monitoring equipment.  NOTE: Remember that this is not meant to be interpreted as a complete list of all possible complications.  Unforeseen problems may occur.  BLOOD THINNERS The following drugs  contain aspirin or other products, which can cause increased bleeding during surgery and should not be taken for 2 weeks prior to and 1 week after surgery.  If you should need take something for relief of minor pain, you may take acetaminophen which is found in Tylenol,m Datril, Anacin-3 and Panadol. It is not blood thinner. The products listed below are.  Do not take any of the products listed below in addition to any listed on your instruction sheet.  A.P.C or A.P.C with Codeine Codeine Phosphate Capsules #3 Ibuprofen Ridaura  ABC compound Congesprin Imuran rimadil  Advil Cope Indocin Robaxisal  Alka-Seltzer Effervescent Pain Reliever and Antacid Coricidin or Coricidin-D  Indomethacin Rufen    Alka-Seltzer plus Cold Medicine Cosprin Ketoprofen S-A-C Tablets  Anacin Analgesic Tablets or Capsules Coumadin Korlgesic Salflex  Anacin Extra Strength Analgesic tablets or capsules CP-2 Tablets Lanoril Salicylate  Anaprox Cuprimine Capsules Levenox Salocol  Anexsia-D Dalteparin Magan Salsalate  Anodynos Darvon compound Magnesium Salicylate Sine-off  Ansaid Dasin Capsules Magsal Sodium Salicylate  Anturane Depen Capsules Marnal Soma  APF Arthritis pain formula Dewitt's Pills Measurin Stanback  Argesic Dia-Gesic Meclofenamic Sulfinpyrazone  Arthritis Bayer Timed Release Aspirin Diclofenac Meclomen Sulindac  Arthritis pain formula Anacin Dicumarol Medipren Supac  Analgesic (Safety coated) Arthralgen Diffunasal Mefanamic Suprofen  Arthritis Strength Bufferin Dihydrocodeine Mepro Compound Suprol  Arthropan liquid Dopirydamole Methcarbomol with Aspirin Synalgos  ASA tablets/Enseals Disalcid Micrainin Tagament  Ascriptin Doan's Midol Talwin  Ascriptin A/D Dolene Mobidin Tanderil  Ascriptin Extra Strength Dolobid Moblgesic Ticlid  Ascriptin with Codeine Doloprin or Doloprin with Codeine Momentum Tolectin  Asperbuf Duoprin Mono-gesic Trendar  Aspergum Duradyne Motrin or Motrin IB Triminicin  Aspirin  plain, buffered or enteric coated Durasal Myochrisine Trigesic  Aspirin Suppositories Easprin Nalfon Trillsate  Aspirin with Codeine Ecotrin Regular or Extra Strength Naprosyn Uracel  Atromid-S Efficin Naproxen Ursinus  Auranofin Capsules Elmiron Neocylate Vanquish  Axotal Emagrin Norgesic Verin  Azathioprine Empirin or Empirin with Codeine Normiflo Vitamin E  Azolid Emprazil Nuprin Voltaren  Bayer Aspirin plain, buffered or children's or timed BC Tablets or powders Encaprin Orgaran Warfarin Sodium  Buff-a-Comp Enoxaparin Orudis Zorpin  Buff-a-Comp with Codeine Equegesic Os-Cal-Gesic   Buffaprin Excedrin plain, buffered or Extra Strength Oxalid   Bufferin Arthritis Strength Feldene Oxphenbutazone   Bufferin plain or Extra Strength Feldene Capsules Oxycodone with Aspirin   Bufferin with Codeine Fenoprofen Fenoprofen Pabalate or Pabalate-SF   Buffets II Flogesic Panagesic   Buffinol plain or Extra Strength Florinal or Florinal with Codeine Panwarfarin   Buf-Tabs Flurbiprofen Penicillamine   Butalbital Compound Four-way cold tablets Penicillin   Butazolidin Fragmin Pepto-Bismol   Carbenicillin Geminisyn Percodan   Carna Arthritis Reliever Geopen Persantine   Carprofen Gold's salt Persistin   Chloramphenicol Goody's Phenylbutazone   Chloromycetin Haltrain Piroxlcam   Clmetidine heparin Plaquenil   Cllnoril Hyco-pap Ponstel   Clofibrate Hydroxy chloroquine Propoxyphen         Before stopping any of these medications, be sure to consult the physician who ordered them.  Some, such as Coumadin (Warfarin) are ordered to prevent or treat serious conditions such as "deep thrombosis", "pumonary embolisms", and other heart problems.  The amount of time that you may need off of the medication may also vary with the medication and the reason for which you were taking it.  If you are taking any of these medications, please make sure you notify your pain physician before you undergo any  procedures.         Radiofrequency Lesioning Radiofrequency lesioning is a procedure that is performed to relieve pain. The procedure is often used for back, neck, or arm pain. Radiofrequency lesioning involves the use of a machine that creates radio waves to make heat. During the procedure, the heat is applied to the nerve that carries the pain signal. The heat damages the nerve and interferes with the pain signal. Pain relief usually starts about 2 weeks after the procedure and lasts for 6 months to 1 year. Tell a health care provider about:  Any allergies you have.  All medicines you are taking, including vitamins, herbs, eye drops, creams, and over-the-counter medicines.  Any problems you or family members have had with anesthetic medicines.  Any blood  disorders you have.  Any surgeries you have had.  Any medical conditions you have.  Whether you are pregnant or may be pregnant. What are the risks? Generally, this is a safe procedure. However, problems may occur, including:  Pain or soreness at the injection site.  Infection at the injection site.  Damage to nerves or blood vessels.  What happens before the procedure?  Ask your health care provider about: ? Changing or stopping your regular medicines. This is especially important if you are taking diabetes medicines or blood thinners. ? Taking medicines such as aspirin and ibuprofen. These medicines can thin your blood. Do not take these medicines before your procedure if your health care provider instructs you not to.  Follow instructions from your health care provider about eating or drinking restrictions.  Plan to have someone take you home after the procedure.  If you go home right after the procedure, plan to have someone with you for 24 hours. What happens during the procedure?  You will be given one or more of the following: ? A medicine to help you relax (sedative). ? A medicine to numb the area (local  anesthetic).  You will be awake during the procedure. You will need to be able to talk with the health care provider during the procedure.  With the help of a type of X-ray (fluoroscopy), the health care provider will insert a radiofrequency needle into the area to be treated.  Next, a wire that carries the radio waves (electrode) will be put through the radiofrequency needle. An electrical pulse will be sent through the electrode to verify the correct nerve. You will feel a tingling sensation, and you may have muscle twitching.  Then, the tissue that is around the needle tip will be heated by an electric current that is passed using the radiofrequency machine. This will numb the nerves.  A bandage (dressing) will be put on the insertion area after the procedure is done. The procedure may vary among health care providers and hospitals. What happens after the procedure?  Your blood pressure, heart rate, breathing rate, and blood oxygen level will be monitored often until the medicines you were given have worn off.  Return to your normal activities as directed by your health care provider. This information is not intended to replace advice given to you by your health care provider. Make sure you discuss any questions you have with your health care provider. Document Released: 08/10/2011 Document Revised: 05/19/2016 Document Reviewed: 01/19/2015 [employer] Interactive Patient Education  [employer].

## 2017-10-04 NOTE — Progress Notes (Signed)
Safety precautions to be maintained throughout the outpatient stay will include: orient to surroundings, keep bed in low position, maintain call bell within reach at all times, provide assistance with transfer out of bed and ambulation.  

## 2017-10-04 NOTE — Progress Notes (Signed)
Patient's Name: John Hartman  MRN: [id number]  Referring Provider: Arnetha Courser, MD  DOB: Jul 17, 1971  PCP: Arnetha Courser, MD  DOS: 10/04/2017  Note by: Gillis Santa, MD  Service setting: Ambulatory outpatient  Specialty: Interventional Pain Management  Location: [facility] (AMB) Pain Management Facility    Patient type: Established   Primary Reason(s) for Visit: Encounter for post-procedure evaluation of chronic illness with mild to moderate exacerbation CC: Back Pain (lower)  HPI  Mr. Luckey is a 46 y.o. year old, male patient, who comes today for a post-procedure evaluation. He has Essential hypertension; Gout; IBS (irritable bowel syndrome); Family history of malignant neoplasm of gastrointestinal tract; Benign neoplasm of rectosigmoid junction; Palpitations; Decongestant abuse; Ketonuria; Glucosuria; Preventative health care; Chest pain; Diabetes mellitus ([place]); Encounter for medication monitoring; Coronary artery disease; Angioedema; Hyperlipidemia LDL goal <70; Elevated serum glutamic pyruvic transaminase (SGPT) level; Elevated liver enzymes; Serum total bilirubin elevated; Fatty liver; Chronic lower back pain; Degenerative disc disease, lumbar; Arthritis, lumbar spine; Calcification of abdominal aorta (HCC); and Elevated hemoglobin (HCC) on his problem list. His primarily concern today is the Back Pain (lower)  Pain Assessment: Location: Lower Back Radiating: right hip and buttocks Onset: More than a month ago Duration: Chronic pain Quality: Aching, Burning, Constant Severity: (P) 7 /10 (self-reported pain score)  Note: Reported level is compatible with observation.                   When using our objective Pain Scale, levels between 6 and 10/10 are said to belong in an emergency room, as it progressively worsens from a 6/10, described as severely limiting, requiring emergency care not usually available at an outpatient pain management facility. At a 6/10 level, communication becomes difficult  and requires great effort. Assistance to reach the emergency department may be required. Facial flushing and profuse sweating along with potentially dangerous increases in heart rate and blood pressure will be evident. Effect on ADL: just getting through the day Timing: Constant Modifying factors: nothing when its this bad  Mr. Asbridge comes in today for post-procedure evaluation after the treatment done on 09/27/2017.  Further details on both, my assessment(s), as well as the proposed treatment plan, please see below.  Post-Procedure Assessment  09/27/2017 Procedure: Bilateral L3-L5 medial branch nerve block #2 Pre-procedure pain score:  8/10 Post-procedure pain score: 0/10         Influential Factors: BMI: 32.08 kg/m Intra-procedural challenges: None observed.         Assessment challenges: None detected.              Reported side-effects: None.        Post-procedural adverse reactions or complications: None reported         Sedation: Please see nurses note. When no sedatives are used, the analgesic levels obtained are directly associated to the effectiveness of the local anesthetics. However, when sedation is provided, the level of analgesia obtained during the initial 1 hour following the intervention, is believed to be the result of a combination of factors. These factors may include, but are not limited to: 1. The effectiveness of the local anesthetics used. 2. The effects of the analgesic(s) and/or anxiolytic(s) used. 3. The degree of discomfort experienced by the patient at the time of the procedure. 4. The patients ability and reliability in recalling and recording the events. 5. The presence and influence of possible secondary gains and/or psychosocial factors. Reported result: Relief experienced during the 1st hour after the procedure:   (  Ultra-Short Term Relief)            Interpretative annotation: Clinically appropriate result. Analgesia during this period is likely to be Local  Anesthetic and/or IV Sedative (Analgesic/Anxiolytic) related.          Effects of local anesthetic: The analgesic effects attained during this period are directly associated to the localized infiltration of local anesthetics and therefore cary significant diagnostic value as to the etiological location, or anatomical origin, of the pain. Expected duration of relief is directly dependent on the pharmacodynamics of the local anesthetic used. Long-acting (4-6 hours) anesthetics used.  Reported result: Relief during the next 4 to 6 hour after the procedure: 100 % (Short-Term Relief)            Interpretative annotation: Clinically appropriate result. Analgesia during this period is likely to be Local Anesthetic-related.          Long-term benefit: Defined as the period of time past the expected duration of local anesthetics (1 hour for short-acting and 4-6 hours for long-acting). With the possible exception of prolonged sympathetic blockade from the local anesthetics, benefits during this period are typically attributed to, or associated with, other factors such as analgesic sensory neuropraxia, antiinflammatory effects, or beneficial biochemical changes provided by agents other than the local anesthetics.  Reported result: Extended relief following procedure: 90 % (first 24 hours then pain returned) (Long-Term Relief)            Interpretative annotation: Clinically appropriate result. Good relief. No permanent benefit expected. Inflammation plays a part in the etiology to the pain.          Current benefits: Defined as reported results that persistent at this point in time.   Analgesia: 75 %            Function: Somewhat improved ROM: Somewhat improved Interpretative annotation: Recurrence of symptoms. No permanent benefit expected. Effective diagnostic intervention.          Interpretation: Results would suggest a successful diagnostic intervention.                  Plan:  Proceed with  Radiofrequency Ablation for the purpose of attaining long-term benefits.       Laboratory Chemistry  Inflammation Markers (CRP: Acute Phase) (ESR: Chronic Phase) No results found for: CRP, ESRSEDRATE               Renal Function Markers Lab Results  Component Value Date   BUN 19 08/04/2017   CREATININE 1.20 08/04/2017   GFRAA 85 08/04/2017   GFRNONAA 73 08/04/2017                 Hepatic Function Markers Lab Results  Component Value Date   AST 90 (H) 09/21/2017   ALT 153 (H) 09/21/2017   ALBUMIN 4.9 09/21/2017   ALKPHOS 99 09/21/2017                 Electrolytes Lab Results  Component Value Date   NA 139 08/04/2017   K 4.8 08/04/2017   CL 101 08/04/2017   CALCIUM 10.0 08/04/2017   MG 2.0 08/27/2016                 Neuropathy Markers No results found for: [id number]               Bone Pathology Markers Lab Results  Component Value Date   ALKPHOS 99 09/21/2017   VD25OH 30 08/04/2017   CALCIUM 10.0 08/04/2017  Coagulation Parameters Lab Results  Component Value Date   INR 0.96 08/11/2016   LABPROT 12.8 08/11/2016   PLT 195 08/04/2017                 Cardiovascular Markers Lab Results  Component Value Date   HGB 17.2 (H) 08/04/2017   HCT 51.1 (H) 08/04/2017                 Note: Lab results reviewed.  Recent Diagnostic Imaging Results  DG C-Arm 1-60 Min-No Report Fluoroscopy was utilized by the requesting physician.  No radiographic  interpretation.   Complexity Note: Imaging results reviewed. Results shared with Mr. Robles, using Layman's terms.                         Meds   Current Outpatient Prescriptions:  .  ACCU-CHEK SOFTCLIX LANCETS lancets, USE AS DIRECTED, Disp: 100 each, Rfl: 0 .  allopurinol (ZYLOPRIM) 100 MG tablet, Take 100 mg by mouth every morning. , Disp: , Rfl:  .  amitriptyline (ELAVIL) 25 MG tablet, 25 mg qhs x 2 weeks, then 50 mg qhs, Disp: 60 tablet, Rfl: 1 .  atenolol (TENORMIN) 50 MG tablet, Take 1 tablet  (50 mg total) by mouth daily., Disp: 90 tablet, Rfl: 3 .  belladonna-PHENObarbital (DONNATAL) 16.2 MG/5ML ELIX, Take 5 mLs (16.2 mg total) by mouth 4 (four) times daily as needed for cramping or pain. (Patient taking differently: Take 5 mLs by mouth 4 (four) times daily as needed for cramping or pain. ), Disp: 600 mL, Rfl: 1 .  cetirizine (ZYRTEC) 10 MG tablet, Take 10 mg by mouth at bedtime. , Disp: , Rfl:  .  colchicine 0.6 MG tablet, Take 0.6 mg by mouth daily as needed., Disp: , Rfl:  .  diphenhydrAMINE (BENADRYL) 25 mg capsule, Take 25 mg by mouth at bedtime. , Disp: , Rfl:  .  EPINEPHrine (EPIPEN 2-PAK) 0.3 mg/0.3 mL IJ SOAJ injection, Inject 0.3 mg into the muscle once., Disp: , Rfl:  .  glucose blood (ACCU-CHEK ACTIVE STRIPS) test strip, Use as instructed, Disp: 100 each, Rfl: 12 .  glucose blood (ACCU-CHEK AVIVA PLUS) test strip, Check FSBS three to four times per day, Disp: 100 each, Rfl: 1 .  Insulin Degludec (TRESIBA FLEXTOUCH) 100 UNIT/ML SOPN, Inject 20 Units into the skin at bedtime. , Disp: 1 pen, Rfl: 0 .  Insulin Pen Needle (B-D UF III MINI PEN NEEDLES) 31G X 5 MM MISC, Once a day with insulin, Disp: 50 each, Rfl: 3 .  Melatonin 5 MG TABS, Take 5 mg by mouth at bedtime. , Disp: , Rfl:  .  naproxen sodium (ANAPROX) 220 MG tablet, Take 1-2 tablets (220-440 mg total) by mouth 2 (two) times daily with a meal., Disp: , Rfl:  .  ranitidine (ZANTAC) 150 MG capsule, Take 150 mg by mouth at bedtime. , Disp: , Rfl:  .  tiZANidine (ZANAFLEX) 4 MG tablet, Take 1 tablet (4 mg total) by mouth every 8 (eight) hours as needed for muscle spasms., Disp: 90 tablet, Rfl: 1 .  cyclobenzaprine (FLEXERIL) 10 MG tablet, TAKE 1 TABLET(10 MG) BY MOUTH EVERY 8 HOURS AS NEEDED FOR MUSCLE SPASMS (Patient not taking: Reported on 10/04/2017), Disp: 30 tablet, Rfl: 0 .  metFORMIN (GLUCOPHAGE-XR) 500 MG 24 hr tablet, Take 1,500 mg by mouth every evening. Take with supper., Disp: , Rfl:   ROS  Constitutional:  Denies any fever or chills Gastrointestinal: No  reported hemesis, hematochezia, vomiting, or acute GI distress Musculoskeletal: Denies any acute onset joint swelling, redness, loss of ROM, or weakness Neurological: No reported episodes of acute onset apraxia, aphasia, dysarthria, agnosia, amnesia, paralysis, loss of coordination, or loss of consciousness  Allergies  Mr. Pasha is allergic to lisinopril and ace inhibitors.  PFSH  Drug: Mr. Gasaway  reports that he does not use drugs. Alcohol:  reports that he does not drink alcohol. Tobacco:  reports that he quit smoking about 2 years ago. His smoking use included Cigarettes. He has a 10.00 pack-year smoking history. He has never used smokeless tobacco. Medical:  has a past medical history of Angioedema; DDD (degenerative disc disease), cervical; Diabetes mellitus ([place]); Fatty liver (03/31/2017); GERD (gastroesophageal reflux disease); Gout; HTN (hypertension); Hypertension; IBS (irritable bowel syndrome); Seasonal allergies; and Spinal stenosis. Surgical: Mr. Harvel  has a past surgical history that includes Shoulder surgery (Left, 2007); Nasal septum surgery (2004); ACDF with fusion (2007); Testicular torsion (1980s); Vasectomy; Colonoscopy with propofol (N/A, 06/26/2015); and Cardiac catheterization (Left, 08/15/2016). Family: family history includes Allergies in his son; Alzheimer's disease in his maternal grandmother; Arrhythmia in his mother; Breast cancer in his mother; Cancer in his maternal grandfather and mother; Diabetes in his father; Heart disease in his brother; Hypertension in his father; Skin cancer in his mother.  Constitutional Exam  General appearance: Well nourished, well developed, and well hydrated. In no apparent acute distress Vitals:   10/04/17 1409  BP: (!) 162/96  Pulse: 80  Resp: 18  Temp: 98.3 F (36.8 C)  TempSrc: Oral  SpO2: 99%  Weight: 230 lb (104.3 kg)  Height: 5' 11" (1.803 m)   BMI Assessment: Estimated body  mass index is 32.08 kg/m as calculated from the following:   Height as of this encounter: 5' 11" (1.803 m).   Weight as of this encounter: 230 lb (104.3 kg).  BMI interpretation table: BMI level Category Range association with higher incidence of chronic pain  <18 kg/m2 Underweight   18.5-24.9 kg/m2 Ideal body weight   25-29.9 kg/m2 Overweight Increased incidence by 20%  30-34.9 kg/m2 Obese (Class I) Increased incidence by 68%  35-39.9 kg/m2 Severe obesity (Class II) Increased incidence by 136%  >40 kg/m2 Extreme obesity (Class III) Increased incidence by 254%   BMI Readings from Last 4 Encounters:  10/04/17 32.08 kg/m  09/27/17 32.08 kg/m  09/20/17 32.08 kg/m  09/13/17 31.38 kg/m   Wt Readings from Last 4 Encounters:  10/04/17 230 lb (104.3 kg)  09/27/17 230 lb (104.3 kg)  09/20/17 230 lb (104.3 kg)  09/13/17 225 lb (102.1 kg)  Psych/Mental status: Alert, oriented x 3 (person, place, & time)       Eyes: PERLA Respiratory: No evidence of acute respiratory distress  Cervical Spine Area Exam  Skin & Axial Inspection: No masses, redness, edema, swelling, or associated skin lesions Alignment: Symmetrical Functional ROM: Unrestricted ROM      Stability: No instability detected Muscle Tone/Strength: Functionally intact. No obvious neuro-muscular anomalies detected. Sensory (Neurological): Unimpaired Palpation: No palpable anomalies              Upper Extremity (UE) Exam    Side: Right upper extremity  Side: Left upper extremity  Skin & Extremity Inspection: Skin color, temperature, and hair growth are WNL. No peripheral edema or cyanosis. No masses, redness, swelling, asymmetry, or associated skin lesions. No contractures.  Skin & Extremity Inspection: Skin color, temperature, and hair growth are WNL. No peripheral edema or cyanosis. No masses,  redness, swelling, asymmetry, or associated skin lesions. No contractures.  Functional ROM: Unrestricted ROM          Functional ROM:  Unrestricted ROM          Muscle Tone/Strength: Functionally intact. No obvious neuro-muscular anomalies detected.  Muscle Tone/Strength: Functionally intact. No obvious neuro-muscular anomalies detected.  Sensory (Neurological): Unimpaired          Sensory (Neurological): Unimpaired          Palpation: No palpable anomalies              Palpation: No palpable anomalies              Specialized Test(s): Deferred         Specialized Test(s): Deferred          Thoracic Spine Area Exam  Skin & Axial Inspection: No masses, redness, or swelling Alignment: Symmetrical Functional ROM: Unrestricted ROM Stability: No instability detected Muscle Tone/Strength: Functionally intact. No obvious neuro-muscular anomalies detected. Sensory (Neurological): Unimpaired Muscle strength & Tone: No palpable anomalies  Lumbar Spine Area Exam  Skin & Axial Inspection: No masses, redness, or swelling Alignment: Symmetrical Functional ROM: Unrestricted ROM      Stability: No instability detected Muscle Tone/Strength: Functionally intact. No obvious neuro-muscular anomalies detected. Sensory (Neurological): Unimpaired Palpation: No palpable anomalies       Provocative Tests: Lumbar Hyperextension and rotation test: Positive bilaterally for facet joint pain. Lumbar Lateral bending test: evaluation deferred today       Patrick's Maneuver: evaluation deferred today                    Gait & Posture Assessment  Ambulation: Limited Gait: Uneven Posture: WNL   Lower Extremity Exam    Side: Right lower extremity  Side: Left lower extremity  Skin & Extremity Inspection: Skin color, temperature, and hair growth are WNL. No peripheral edema or cyanosis. No masses, redness, swelling, asymmetry, or associated skin lesions. No contractures.  Skin & Extremity Inspection: Skin color, temperature, and hair growth are WNL. No peripheral edema or cyanosis. No masses, redness, swelling, asymmetry, or associated skin lesions.  No contractures.  Functional ROM: Unrestricted ROM          Functional ROM: Unrestricted ROM          Muscle Tone/Strength: Functionally intact. No obvious neuro-muscular anomalies detected.  Muscle Tone/Strength: Functionally intact. No obvious neuro-muscular anomalies detected.  Sensory (Neurological): Unimpaired  Sensory (Neurological): Unimpaired  Palpation: No palpable anomalies  Palpation: No palpable anomalies   Assessment  Primary Diagnosis & Pertinent Problem List: The primary encounter diagnosis was Spondylosis of lumbar region without myelopathy or radiculopathy. Diagnoses of Lumbar facet arthropathy, Chronic bilateral low back pain without sciatica, Degenerative disc disease, lumbar, and Arthritis, lumbar spine were also pertinent to this visit.  Status Diagnosis  Responding Responding Responding 1. Spondylosis of lumbar region without myelopathy or radiculopathy   2. Lumbar facet arthropathy   3. Chronic bilateral low back pain without sciatica   4. Degenerative disc disease, lumbar   5. Arthritis, lumbar spine      46 year old male with a past medical history of hypertension, irritable bowel syndrome, coronary artery disease, type 2 diabetes on insulin who presents with low back pain secondary to lumbar spondylosis without myelopathy, lumbar facet arthropathy.Patient does have pain with facet loading, that radiates to his low back and buttock region but does not extend beyond his knees. Patient straight leg raise test was negative. He also endorses  greater pain with lumbar extension and lateral rotation as well as suggesting facet pathology. Patient hastried physical therapy which was not very effective and has tried various medications including tramadol, Flexeril, naproxen which were not effective either. Lumbar spine xray with facet arthropathy and multilevel DDD, most pronounced at L3-S1  Status post diagnostic lumbar facets on 09/13/2017 and on 09/27/2017 with greater than  80% improvement in low back pain and buttock pain along with range of motion for 3-4 days after the procedure. Patient states that his pain was better and that he was able to ambulate with a greater degree of comfort after each diagnostic block. This would suggest successful diagnostic blocks, plan for bilateral radiofrequency ablation.  Plan: -lumbar radiofrequency ablation, will start with one side first and then proceed with the second. This will be done under IV sedation. -Patient endorsing greater severity of low back pain that started this morning. We'll plan on giving IM injection of Toradol and Norflex.    Plan of Care  Pharmacotherapy (Medications Ordered): Meds ordered this encounter  Medications  . orphenadrine (NORFLEX) injection 60 mg  . ketorolac (TORADOL) injection 60 mg   Lab-work, procedure(s), and/or referral(s): Orders Placed This Encounter  Procedures  . Radiofrequency,Lumbar   Pharmacological management options: Opioid Analgesics:Non-opioid management only At this time. Will consider tramadol or Nucynta if interventional options in non-opioid analgesics are not effective.   Membrane stabilizer:To be determined at a later timeconsider gabapentin, Lyrica   Muscle relaxant:To be determined at a later timehas tried Flexeril, can consider tizanidine, baclofen, Robaxin  NSAID:To be determined at a later timeis tried naproxen in the past. Can consider Mobic, diclofenac   Other analgesic(s):To be determined at a later timeTENS, Cymbalta, topicals, TCAs    Interventional management options: Mr.Crueywas informed that there is no guarantee that hewould be a candidate for interventional therapies. The decision will be based on the results of diagnostic studies, as well as Mr.Probasco's risk profile.  Procedure(s) under consideration:  -Bilateral radiofrequency ablation -Sacroiliac joint injection -Trigger point injections -Lumbar epidural steroid injection     Provider-requested follow-up: No Follow-up on file.  Future Appointments Date Time Provider [place]  08/08/2018 11:00 AM Lada, Satira Anis, MD [place] None    Primary Care Physician: Arnetha Courser, MD Location: [facility] Note by: Gillis Santa, M.D Date: 10/04/2017; Time: 3:38 PM  Patient Instructions   GENERAL RISKS AND COMPLICATIONS  What are the risk, side effects and possible complications? Generally speaking, most procedures are safe.  However, with any procedure there are risks, side effects, and the possibility of complications.  The risks and complications are dependent upon the sites that are lesioned, or the type of nerve block to be performed.  The closer the procedure is to the spine, the more serious the risks are.  Great care is taken when placing the radio frequency needles, block needles or lesioning probes, but sometimes complications can occur. 1. Infection: Any time there is an injection through the skin, there is a risk of infection.  This is why sterile conditions are used for these blocks.  There are four possible types of infection. 1. Localized skin infection. 2. Central Nervous System Infection-This can be in the form of Meningitis, which can be deadly. 3. Epidural Infections-This can be in the form of an epidural abscess, which can cause pressure inside of the spine, causing compression of the spinal cord with subsequent paralysis. This would require an emergency surgery to decompress, and there are  no guarantees that the patient would recover from the paralysis. 4. Discitis-This is an infection of the intervertebral discs.  It occurs in about 1% of discography procedures.  It is difficult to treat and it may lead to surgery.        2. Pain: the needles have to go through skin and soft tissues, will cause soreness.       3. Damage to internal structures:  The nerves to be lesioned may be near blood vessels or    other  nerves which can be potentially damaged.       4. Bleeding: Bleeding is more common if the patient is taking blood thinners such as  aspirin, Coumadin, Ticiid, Plavix, etc., or if he/she have some genetic predisposition  such as hemophilia. Bleeding into the spinal canal can cause compression of the spinal  cord with subsequent paralysis.  This would require an emergency surgery to  decompress and there are no guarantees that the patient would recover from the  paralysis.       5. Pneumothorax:  Puncturing of a lung is a possibility, every time a needle is introduced in  the area of the chest or upper back.  Pneumothorax refers to free air around the  collapsed lung(s), inside of the thoracic cavity (chest cavity).  Another two possible  complications related to a similar event would include: Hemothorax and Chylothorax.   These are variations of the Pneumothorax, where instead of air around the collapsed  lung(s), you may have blood or chyle, respectively.       6. Spinal headaches: They may occur with any procedures in the area of the spine.       7. Persistent CSF (Cerebro-Spinal Fluid) leakage: This is a rare problem, but may occur  with prolonged intrathecal or epidural catheters either due to the formation of a fistulous  track or a dural tear.       8. Nerve damage: By working so close to the spinal cord, there is always a possibility of  nerve damage, which could be as serious as a permanent spinal cord injury with  paralysis.       9. Death:  Although rare, severe deadly allergic reactions known as "Anaphylactic  reaction" can occur to any of the medications used.      10. Worsening of the symptoms:  We can always make thing worse.  What are the chances of something like this happening? Chances of any of this occuring are extremely low.  By statistics, you have more of a chance of getting killed in a motor vehicle accident: while driving to the hospital than any of the above occurring .   Nevertheless, you should be aware that they are possibilities.  In general, it is similar to taking a shower.  Everybody knows that you can slip, hit your head and get killed.  Does that mean that you should not shower again?  Nevertheless always keep in mind that statistics do not mean anything if you happen to be on the wrong side of them.  Even if a procedure has a 1 (one) in a 1,000,000 (million) chance of going wrong, it you happen to be that one..Also, keep in mind that by statistics, you have more of a chance of having something go wrong when taking medications.  Who should not have this procedure? If you are on a blood thinning medication (e.g. Coumadin, Plavix, see list of "Blood Thinners"), or if you have an active infection  going on, you should not have the procedure.  If you are taking any blood thinners, please inform your physician.  How should I prepare for this procedure?  Do not eat or drink anything at least six hours prior to the procedure.  Bring a driver with you .  It cannot be a taxi.  Come accompanied by an adult that can drive you back, and that is strong enough to help you if your legs get weak or numb from the local anesthetic.  Take all of your medicines the morning of the procedure with just enough water to swallow them.  If you have diabetes, make sure that you are scheduled to have your procedure done first thing in the morning, whenever possible.  If you have diabetes, take only half of your insulin dose and notify our nurse that you have done so as soon as you arrive at the clinic.  If you are diabetic, but only take blood sugar pills (oral hypoglycemic), then do not take them on the morning of your procedure.  You may take them after you have had the procedure.  Do not take aspirin or any aspirin-containing medications, at least eleven (11) days prior to the procedure.  They may prolong bleeding.  Wear loose fitting clothing that may be easy to take off and  that you would not mind if it got stained with Betadine or blood.  Do not wear any jewelry or perfume  Remove any nail coloring.  It will interfere with some of our monitoring equipment.  NOTE: Remember that this is not meant to be interpreted as a complete list of all possible complications.  Unforeseen problems may occur.  BLOOD THINNERS The following drugs contain aspirin or other products, which can cause increased bleeding during surgery and should not be taken for 2 weeks prior to and 1 week after surgery.  If you should need take something for relief of minor pain, you may take acetaminophen which is found in Tylenol,m Datril, Anacin-3 and Panadol. It is not blood thinner. The products listed below are.  Do not take any of the products listed below in addition to any listed on your instruction sheet.  A.P.C or A.P.C with Codeine Codeine Phosphate Capsules #3 Ibuprofen Ridaura  ABC compound Congesprin Imuran rimadil  Advil Cope Indocin Robaxisal  Alka-Seltzer Effervescent Pain Reliever and Antacid Coricidin or Coricidin-D  Indomethacin Rufen  Alka-Seltzer plus Cold Medicine Cosprin Ketoprofen S-A-C Tablets  Anacin Analgesic Tablets or Capsules Coumadin Korlgesic Salflex  Anacin Extra Strength Analgesic tablets or capsules CP-2 Tablets Lanoril Salicylate  Anaprox Cuprimine Capsules Levenox Salocol  Anexsia-D Dalteparin Magan Salsalate  Anodynos Darvon compound Magnesium Salicylate Sine-off  Ansaid Dasin Capsules Magsal Sodium Salicylate  Anturane Depen Capsules Marnal Soma  APF Arthritis pain formula Dewitt's Pills Measurin Stanback  Argesic Dia-Gesic Meclofenamic Sulfinpyrazone  Arthritis Bayer Timed Release Aspirin Diclofenac Meclomen Sulindac  Arthritis pain formula Anacin Dicumarol Medipren Supac  Analgesic (Safety coated) Arthralgen Diffunasal Mefanamic Suprofen  Arthritis Strength Bufferin Dihydrocodeine Mepro Compound Suprol  Arthropan liquid Dopirydamole Methcarbomol with  Aspirin Synalgos  ASA tablets/Enseals Disalcid Micrainin Tagament  Ascriptin Doan's Midol Talwin  Ascriptin A/D Dolene Mobidin Tanderil  Ascriptin Extra Strength Dolobid Moblgesic Ticlid  Ascriptin with Codeine Doloprin or Doloprin with Codeine Momentum Tolectin  Asperbuf Duoprin Mono-gesic Trendar  Aspergum Duradyne Motrin or Motrin IB Triminicin  Aspirin plain, buffered or enteric coated Durasal Myochrisine Trigesic  Aspirin Suppositories Easprin Nalfon Trillsate  Aspirin with Codeine Ecotrin Regular or Extra  Strength Naprosyn Uracel  Atromid-S Efficin Naproxen Ursinus  Auranofin Capsules Elmiron Neocylate Vanquish  Axotal Emagrin Norgesic Verin  Azathioprine Empirin or Empirin with Codeine Normiflo Vitamin E  Azolid Emprazil Nuprin Voltaren  Bayer Aspirin plain, buffered or children's or timed BC Tablets or powders Encaprin Orgaran Warfarin Sodium  Buff-a-Comp Enoxaparin Orudis Zorpin  Buff-a-Comp with Codeine Equegesic Os-Cal-Gesic   Buffaprin Excedrin plain, buffered or Extra Strength Oxalid   Bufferin Arthritis Strength Feldene Oxphenbutazone   Bufferin plain or Extra Strength Feldene Capsules Oxycodone with Aspirin   Bufferin with Codeine Fenoprofen Fenoprofen Pabalate or Pabalate-SF   Buffets II Flogesic Panagesic   Buffinol plain or Extra Strength Florinal or Florinal with Codeine Panwarfarin   Buf-Tabs Flurbiprofen Penicillamine   Butalbital Compound Four-way cold tablets Penicillin   Butazolidin Fragmin Pepto-Bismol   Carbenicillin Geminisyn Percodan   Carna Arthritis Reliever Geopen Persantine   Carprofen Gold's salt Persistin   Chloramphenicol Goody's Phenylbutazone   Chloromycetin Haltrain Piroxlcam   Clmetidine heparin Plaquenil   Cllnoril Hyco-pap Ponstel   Clofibrate Hydroxy chloroquine Propoxyphen         Before stopping any of these medications, be sure to consult the physician who ordered them.  Some, such as Coumadin (Warfarin) are ordered to prevent or  treat serious conditions such as "deep thrombosis", "pumonary embolisms", and other heart problems.  The amount of time that you may need off of the medication may also vary with the medication and the reason for which you were taking it.  If you are taking any of these medications, please make sure you notify your pain physician before you undergo any procedures.         Radiofrequency Lesioning Radiofrequency lesioning is a procedure that is performed to relieve pain. The procedure is often used for back, neck, or arm pain. Radiofrequency lesioning involves the use of a machine that creates radio waves to make heat. During the procedure, the heat is applied to the nerve that carries the pain signal. The heat damages the nerve and interferes with the pain signal. Pain relief usually starts about 2 weeks after the procedure and lasts for 6 months to 1 year. Tell a health care provider about:  Any allergies you have.  All medicines you are taking, including vitamins, herbs, eye drops, creams, and over-the-counter medicines.  Any problems you or family members have had with anesthetic medicines.  Any blood disorders you have.  Any surgeries you have had.  Any medical conditions you have.  Whether you are pregnant or may be pregnant. What are the risks? Generally, this is a safe procedure. However, problems may occur, including:  Pain or soreness at the injection site.  Infection at the injection site.  Damage to nerves or blood vessels.  What happens before the procedure?  Ask your health care provider about: ? Changing or stopping your regular medicines. This is especially important if you are taking diabetes medicines or blood thinners. ? Taking medicines such as aspirin and ibuprofen. These medicines can thin your blood. Do not take these medicines before your procedure if your health care provider instructs you not to.  Follow instructions from your health care provider  about eating or drinking restrictions.  Plan to have someone take you home after the procedure.  If you go home right after the procedure, plan to have someone with you for 24 hours. What happens during the procedure?  You will be given one or more of the following: ? A medicine to help  you relax (sedative). ? A medicine to numb the area (local anesthetic).  You will be awake during the procedure. You will need to be able to talk with the health care provider during the procedure.  With the help of a type of X-ray (fluoroscopy), the health care provider will insert a radiofrequency needle into the area to be treated.  Next, a wire that carries the radio waves (electrode) will be put through the radiofrequency needle. An electrical pulse will be sent through the electrode to verify the correct nerve. You will feel a tingling sensation, and you may have muscle twitching.  Then, the tissue that is around the needle tip will be heated by an electric current that is passed using the radiofrequency machine. This will numb the nerves.  A bandage (dressing) will be put on the insertion area after the procedure is done. The procedure may vary among health care providers and hospitals. What happens after the procedure?  Your blood pressure, heart rate, breathing rate, and blood oxygen level will be monitored often until the medicines you were given have worn off.  Return to your normal activities as directed by your health care provider. This information is not intended to replace advice given to you by your health care provider. Make sure you discuss any questions you have with your health care provider. Document Released: 08/10/2011 Document Revised: 05/19/2016 Document Reviewed: 01/19/2015 [employer] Interactive Patient Education  [employer].

## 2017-10-23 ENCOUNTER — Ambulatory Visit: Payer: 59 | Admitting: Student in an Organized Health Care Education/Training Program

## 2017-10-30 ENCOUNTER — Encounter: Payer: Self-pay | Admitting: Student in an Organized Health Care Education/Training Program

## 2017-10-30 ENCOUNTER — Ambulatory Visit
Admission: RE | Admit: 2017-10-30 | Discharge: 2017-10-30 | Disposition: A | Discharge status: Home / Self Care | Payer: No Typology Code available for payment source | Source: Ambulatory Visit | Attending: Student in an Organized Health Care Education/Training Program | Admitting: Student in an Organized Health Care Education/Training Program

## 2017-10-30 ENCOUNTER — Ambulatory Visit (HOSPITAL_BASED_OUTPATIENT_CLINIC_OR_DEPARTMENT_OTHER)
Payer: No Typology Code available for payment source | Admitting: Student in an Organized Health Care Education/Training Program

## 2017-10-30 VITALS — BP 135/98 | HR 51 | Temp 97.8°F | Resp 17 | Ht 71.0 in | Wt 230.0 lb

## 2017-10-30 DIAGNOSIS — G8929 Other chronic pain: Secondary | ICD-10-CM

## 2017-10-30 DIAGNOSIS — Z9889 Other specified postprocedural states: Secondary | ICD-10-CM | POA: Insufficient documentation

## 2017-10-30 DIAGNOSIS — M5136 Other intervertebral disc degeneration, lumbar region: Secondary | ICD-10-CM | POA: Insufficient documentation

## 2017-10-30 DIAGNOSIS — N4400 Torsion of testis, unspecified: Secondary | ICD-10-CM | POA: Diagnosis not present

## 2017-10-30 DIAGNOSIS — M47816 Spondylosis without myelopathy or radiculopathy, lumbar region: Secondary | ICD-10-CM | POA: Diagnosis not present

## 2017-10-30 DIAGNOSIS — Z794 Long term (current) use of insulin: Secondary | ICD-10-CM | POA: Diagnosis not present

## 2017-10-30 DIAGNOSIS — M545 Low back pain, unspecified: Secondary | ICD-10-CM

## 2017-10-30 MED ORDER — ROPIVACAINE HCL 2 MG/ML IJ SOLN
10.0000 mL | Freq: Once | INTRAMUSCULAR | Status: AC
  Administered 2017-10-30: 1 mL
  Filled 2017-10-30: qty 10

## 2017-10-30 MED ORDER — DEXAMETHASONE SODIUM PHOSPHATE 10 MG/ML IJ SOLN
10.0000 mg | Freq: Once | INTRAMUSCULAR | Status: AC
  Administered 2017-10-30: 10 mg
  Filled 2017-10-30: qty 1

## 2017-10-30 MED ORDER — FENTANYL CITRATE (PF) 100 MCG/2ML IJ SOLN
25.0000 ug | INTRAMUSCULAR | Status: DC | PRN
  Administered 2017-10-30: 50 ug via INTRAVENOUS
  Filled 2017-10-30: qty 2

## 2017-10-30 MED ORDER — LIDOCAINE HCL (PF) 1 % IJ SOLN
10.0000 mL | Freq: Once | INTRAMUSCULAR | Status: AC
  Administered 2017-10-30: 5 mL
  Filled 2017-10-30: qty 10

## 2017-10-30 MED ORDER — LACTATED RINGERS IV SOLN
1000.0000 mL | Freq: Once | INTRAVENOUS | Status: AC
  Administered 2017-10-30: 1000 mL via INTRAVENOUS

## 2017-10-30 NOTE — Progress Notes (Signed)
Patient's Name: John Hartman  MRN: [id number]  Referring Provider: Gillis Santa, MD  DOB: 01/21/1971  PCP: Arnetha Courser, MD  DOS: 10/30/2017  Note by: Gillis Santa, MD  Service setting: Ambulatory outpatient  Specialty: Interventional Pain Management  Patient type: Established  Location: [facility] (AMB) Pain Management Facility  Visit type: Interventional Procedure   Primary Reason for Visit: Interventional Pain Management Treatment. CC: Back Pain (lower)  Procedure:  Anesthesia, Analgesia, Anxiolysis:  Type: Therapeutic Medial Branch Facet Radiofrequency Ablation Region: Lumbar Level:  L3, L4, L5,  Medial Branch Level(s) Laterality: Right-Sided  Type: Local Anesthesia with Moderate (Conscious) Sedation Local Anesthetic: Lidocaine 1% Route: Intravenous (IV) IV Access: Secured Sedation: Meaningful verbal contact was maintained at all times during the procedure  Indication(s): Analgesia and Anxiety   Indications: 1. Spondylosis of lumbar region without myelopathy or radiculopathy   2. Lumbar facet arthropathy   3. Chronic bilateral low back pain without sciatica   4. Degenerative disc disease, lumbar    John Hartman has either failed to respond, was unable to tolerate, or simply did not get enough benefit from other more conservative therapies including, but not limited to: 1. Over-the-counter medications 2. Anti-inflammatory medications 3. Muscle relaxants 4. Membrane stabilizers 5. Opioids 6. Physical therapy 7. Modalities (Heat, ice, etc.) 8. Invasive techniques such as nerve blocks. John Hartman has attained more than 50% relief of the pain from a series of diagnostic injections conducted in separate occasions.  Pain Score: Pre-procedure: 4 /10 Post-procedure: 0-No pain/10  Pre-op Assessment:  John Hartman is a 46 y.o. (year old), male patient, seen today for interventional treatment. He  has a past surgical history that includes Shoulder surgery (Left, 2007); Nasal septum surgery  (2004); ACDF with fusion (2007); Testicular torsion (1980s); Vasectomy; Left Heart Cath and Coronary Angiography (Left, 08/15/2016); and COLONOSCOPY WITH PROPOFOL (N/A, 06/26/2015). John Hartman has a current medication list which includes the following prescription(s): accu-chek softclix lancets, allopurinol, amitriptyline, atenolol, belladonna-phenobarbital, cetirizine, colchicine, diphenhydramine, epinephrine, glucose blood, glucose blood, insulin degludec, insulin pen needle, melatonin, naproxen sodium, ranitidine, tizanidine, cyclobenzaprine, and metformin, and the following Facility-Administered Medications: fentanyl. His primarily concern today is the Back Pain (lower)  Initial Vital Signs: Blood pressure (!) 155/103, pulse 60, temperature 98.4 F (36.9 C), resp. rate 18, height 5\' 11"  (1.803 m), weight 230 lb (104.3 kg), SpO2 98 %. BMI: Estimated body mass index is 32.08 kg/m as calculated from the following:   Height as of this encounter: 5\' 11"  (1.803 m).   Weight as of this encounter: 230 lb (104.3 kg).  Risk Assessment: Allergies: Reviewed. He is allergic to lisinopril and ace inhibitors.  Allergy Precautions: None required Coagulopathies: Reviewed. None identified.  Blood-thinner therapy: None at this time Active Infection(s): Reviewed. None identified. John Hartman is afebrile  Site Confirmation: John Hartman was asked to confirm the procedure and laterality before marking the site Procedure checklist: Completed Consent: Before the procedure and under the influence of no sedative(s), amnesic(s), or anxiolytics, the patient was informed of the treatment options, risks and possible complications. To fulfill our ethical and legal obligations, as recommended by the American Medical Association's Code of Ethics, I have informed the patient of my clinical impression; the nature and purpose of the treatment or procedure; the risks, benefits, and possible complications of the intervention; the  alternatives, including doing nothing; the risk(s) and benefit(s) of the alternative treatment(s) or procedure(s); and the risk(s) and benefit(s) of doing nothing. The patient was provided information about the general risks and  possible complications associated with the procedure. These may include, but are not limited to: failure to achieve desired goals, infection, bleeding, organ or nerve damage, allergic reactions, paralysis, and death. In addition, the patient was informed of those risks and complications associated to Spine-related procedures, such as failure to decrease pain; infection (i.e.: Meningitis, epidural or intraspinal abscess); bleeding (i.e.: epidural hematoma, subarachnoid hemorrhage, or any other type of intraspinal or peri-dural bleeding); organ or nerve damage (i.e.: Any type of peripheral nerve, nerve root, or spinal cord injury) with subsequent damage to sensory, motor, and/or autonomic systems, resulting in permanent pain, numbness, and/or weakness of one or several areas of the body; allergic reactions; (i.e.: anaphylactic reaction); and/or death. Furthermore, the patient was informed of those risks and complications associated with the medications. These include, but are not limited to: allergic reactions (i.e.: anaphylactic or anaphylactoid reaction(s)); adrenal axis suppression; blood sugar elevation that in diabetics may result in ketoacidosis or comma; water retention that in patients with history of congestive heart failure may result in shortness of breath, pulmonary edema, and decompensation with resultant heart failure; weight gain; swelling or edema; medication-induced neural toxicity; particulate matter embolism and blood vessel occlusion with resultant organ, and/or nervous system infarction; and/or aseptic necrosis of one or more joints. Finally, the patient was informed that Medicine is not an exact science; therefore, there is also the possibility of unforeseen or  unpredictable risks and/or possible complications that may result in a catastrophic outcome. The patient indicated having understood very clearly. We have given the patient no guarantees and we have made no promises. Enough time was given to the patient to ask questions, all of which were answered to the patient's satisfaction. Mr. Tiegs has indicated that he wanted to continue with the procedure. Attestation: I, the ordering provider, attest that I have discussed with the patient the benefits, risks, side-effects, alternatives, likelihood of achieving goals, and potential problems during recovery for the procedure that I have provided informed consent. Date: 10/30/2017; Time: 8:44 AM  Pre-Procedure Preparation:  Monitoring: As per clinic protocol. Respiration, ETCO2, SpO2, BP, heart rate and rhythm monitor placed and checked for adequate function Safety Precautions: Patient was assessed for positional comfort and pressure points before starting the procedure. Time-out: I initiated and conducted the "Time-out" before starting the procedure, as per protocol. The patient was asked to participate by confirming the accuracy of the "Time Out" information. Verification of the correct person, site, and procedure were performed and confirmed by me, the nursing staff, and the patient. "Time-out" conducted as per Joint Commission's Universal Protocol (UP.01.01.01). "Time-out" Date & Time: 10/30/2017; 0850 hrs.  Description of Procedure Process:   Position: Prone Target Area: For Lumbar Facet blocks, the target is the groove formed by the junction of the transverse process and superior articular process. For the L5 dorsal ramus, the target is the notch between superior articular process and sacral ala.  Approach: Paraspinal approach. Area Prepped: Entire Posterior Lumbosacral Region Prepping solution: Hibiclens (4.0% Chlorhexidine gluconate solution) Safety Precautions: Aspiration looking for blood return was  conducted prior to all injections. At no point did we inject any substances, as a needle was being advanced. No attempts were made at seeking any paresthesias. Safe injection practices and needle disposal techniques used. Medications properly checked for expiration dates. SDV (single dose vial) medications used. Description of the Procedure: Protocol guidelines were followed. The patient was placed in position over the procedure table. The target area was identified and the area prepped in the usual  manner. The skin and muscle were infiltrated with local anesthetic. Appropriate amount of time allowed to pass for local anesthetics to take effect. Radiofrequency needles were introduced to the target area using fluoroscopic guidance. Using the NeuroTherm NT1100 Radiofrequency Generator, sensory stimulation using 50 Hz was used to locate & identify the nerve, making sure that the needle was positioned such that there was no sensory stimulation below 0.3 V or above 0.7 V. Stimulation using 2 Hz was used to evaluate the motor component. Care was taken not to lesion any nerves that demonstrated motor stimulation of the lower extremities at an output of less than 2.5 times that of the sensory threshold, or a maximum of 2.0 V. Once satisfactory placement of the needles was achieved, the numbing solution was slowly injected after negative aspiration. After waiting for at least 2 minutes, the ablation was performed at 80 degrees C for 60 seconds, using regular Radiofrequency settings. Once the procedure was completed, the needles were then removed and the area cleansed, making sure to leave some of the prepping solution back to take advantage of its long term bactericidal properties. Intra-operative Compliance: Compliant  Illustration of the posterior view of the lumbar spine and the posterior neural structures. Laminae of L2 through S1 are labeled. DPRL5, dorsal primary ramus of L5; DPRS1, dorsal primary ramus of S1;  DPR3, dorsal primary ramus of L3; FJ, facet (zygapophyseal) joint L3-L4; I, inferior articular process of L4; LB1, lateral branch of dorsal primary ramus of L1; IAB, inferior articular branches from L3 medial branch (supplies L4-L5 facet joint); IBP, intermediate branch plexus; MB3, medial branch of dorsal primary ramus of L3; NR3, third lumbar nerve root; S, superior articular process of L5; SAB, superior articular branches from L4 (supplies L4-5 facet joint also); TP3, transverse process of L3.  Vitals:   10/30/17 0919 10/30/17 0928 10/30/17 0937 10/30/17 0946  BP: (!) 162/104 (!) 149/93 (!) 163/101 (!) 135/98  Pulse: (!) 53 (!) 54 61 (!) 51  Resp: 13 12 17 17   Temp:  97.8 F (36.6 C)    SpO2: 96% 98% 99% 97%  Weight:      Height:        Start Time: 0850 hrs. End Time: 0916 hrs. Materials & Medications:  Needle(s) Type: Teflon-coated, curved tip, Radiofrequency needle(s) Gauge: 22G Length: 10cm Medication(s): We administered lactated ringers, fentaNYL, lidocaine (PF), ropivacaine (PF) 2 mg/mL (0.2%), and dexamethasone. Please see chart orders for dosing details. 0.3 cc of Decadron (10 mg/cc) injected at each level post ablation flushed with 1 cc of 1% lidocaine and then needles were removed.  Imaging Guidance (Spinal):  Type of Imaging Technique: Fluoroscopy Guidance (Spinal) Indication(s): Assistance in needle guidance and placement for procedures requiring needle placement in or near specific anatomical locations not easily accessible without such assistance. Exposure Time: Please see nurses notes. Contrast: None used. Fluoroscopic Guidance: I was personally present during the use of fluoroscopy. "Tunnel Vision Technique" used to obtain the best possible view of the target area. Parallax error corrected before commencing the procedure. "Direction-depth-direction" technique used to introduce the needle under continuous pulsed fluoroscopy. Once target was reached, antero-posterior,  oblique, and lateral fluoroscopic projection used confirm needle placement in all planes. Images permanently stored in EMR. Interpretation: No contrast injected. I personally interpreted the imaging intraoperatively. Adequate needle placement confirmed in multiple planes. Permanent images saved into the patient's record.   Antibiotic Prophylaxis:  Indication(s): None identified Antibiotic given: None  Post-operative Assessment:  EBL: None Complications: No immediate post-treatment  complications observed by team, or reported by patient. Note: The patient tolerated the entire procedure well. A repeat set of vitals were taken after the procedure and the patient was kept under observation following institutional policy, for this type of procedure. Post-procedural neurological assessment was performed, showing return to baseline, prior to discharge. The patient was provided with post-procedure discharge instructions, including a section on how to identify potential problems. Should any problems arise concerning this procedure, the patient was given instructions to immediately contact us, at any time, without hesitation. In any case, we plan to contact the patient by telephone for a follow-up status report regarding this interventional procedure. Comments:  No additional relevant information. 5 out of 5 strength bilateral lower extremity: Plantar flexion, dorsiflexion, knee flexion, knee extension.  Plan of Care   Imaging Orders     DG C-Arm 1-60 Min-No Report  Procedure Orders     Radiofrequency,Lumbar  Follow-up for contralateral RFA.  Medications ordered for procedure: Meds ordered this encounter  Medications  . lactated ringers infusion 1,000 mL  . fentaNYL (SUBLIMAZE) injection 25-50 mcg    Make sure Narcan is available in the pyxis when using this medication. In the event of respiratory depression (RR< 8/min): Titrate NARCAN (naloxone) in increments of 0.1 to 0.2 mg IV at 2-3 minute  intervals, until desired degree of reversal.  . lidocaine (PF) (XYLOCAINE) 1 % injection 10 mL  . ropivacaine (PF) 2 mg/mL (0.2%) (NAROPIN) injection 10 mL  . dexamethasone (DECADRON) injection 10 mg   Medications administered: We administered lactated ringers, fentaNYL, lidocaine (PF), ropivacaine (PF) 2 mg/mL (0.2%), and dexamethasone.  See the medical record for exact dosing, route, and time of administration.   Disposition: Discharge home  Discharge Date & Time: 10/30/2017; 0947 hrs.   Physician-requested Follow-up: Return in about 3 weeks (around 11/20/2017) for Contra-lateral RFA. Future Appointments  Date Time Provider [place]  08/08/2018 11:00 AM Lada, Satira Anis, MD [facility]   Primary Care Physician: Arnetha Courser, MD Location: [facility] Note by: Gillis Santa, MD Date: 10/30/2017; Time: 11:16 AM  Disclaimer:  Medicine is not an exact science. The only guarantee in medicine is that nothing is guaranteed. It is important to note that the decision to proceed with this intervention was based on the information collected from the patient. The Data and conclusions were drawn from the patient's questionnaire, the interview, and the physical examination. Because the information was provided in large part by the patient, it cannot be guaranteed that it has not been purposely or unconsciously manipulated. Every effort has been made to obtain as much relevant data as possible for this evaluation. It is important to note that the conclusions that lead to this procedure are derived in large part from the available data. Always take into account that the treatment will also be dependent on availability of resources and existing treatment guidelines, considered by other Pain Management Practitioners as being common knowledge and practice, at the time of the intervention. For Medico-Legal purposes, it is also important to point out that variation in  procedural techniques and pharmacological choices are the acceptable norm. The indications, contraindications, technique, and results of the above procedure should only be interpreted and judged by a Board-Certified Interventional Pain Specialist with extensive familiarity and expertise in the same exact procedure and technique.

## 2017-10-30 NOTE — Progress Notes (Signed)
Safety precautions to be maintained throughout the outpatient stay will include: orient to surroundings, keep bed in low position, maintain call bell within reach at all times, provide assistance with transfer out of bed and ambulation.  

## 2017-10-30 NOTE — Patient Instructions (Signed)
Preparing for Procedure with Sedation Instructions: . Oral Intake: Do not eat or drink anything for at least 8 hours prior to your procedure. . Transportation: Public transportation is not allowed. Bring an adult driver. The driver must be physically present in our waiting room before any procedure can be started. . Physical Assistance: Bring an adult capable of physically assisting you, in the event you need help. . Blood Pressure Medicine: Take your blood pressure medicine with a sip of water the morning of the procedure. . Insulin: Take only  of your normal insulin dose. . Preventing infections: Shower with an antibacterial soap the morning of your procedure. . Build-up your immune system: Take 1000 mg of Vitamin C with every meal (3 times a day) the day prior to your procedure. . Pregnancy: If you are pregnant, call and cancel the procedure. . Sickness: If you have a cold, fever, or any active infections, call and cancel the procedure. . Arrival: You must be in the facility at least 30 minutes prior to your scheduled procedure. . Children: Do not bring children with you. . Dress appropriately: Bring dark clothing that you would not mind if they get stained. . Valuables: Do not bring any jewelry or valuables. Procedure appointments are reserved for interventional treatments only. . No Prescription Refills. . No medication changes will be discussed during procedure appointments. No disability issues will be discussed.Radiofrequency Lesioning Radiofrequency lesioning is a procedure that is performed to relieve pain. The procedure is often used for back, neck, or arm pain. Radiofrequency lesioning involves the use of a machine that creates radio waves to make heat. During the procedure, the heat is applied to the nerve that carries the pain signal. The heat damages the nerve and interferes with the pain signal. Pain relief usually starts about 2 weeks after the procedure and lasts for 6 months to  1 year. Tell a health care provider about: Any allergies you have. All medicines you are taking, including vitamins, herbs, eye drops, creams, and over-the-counter medicines. Any problems you or family members have had with anesthetic medicines. Any blood disorders you have. Any surgeries you have had. Any medical conditions you have. Whether you are pregnant or may be pregnant. What are the risks? Generally, this is a safe procedure. However, problems may occur, including: Pain or soreness at the injection site. Infection at the injection site. Damage to nerves or blood vessels.  What happens before the procedure? Ask your health care provider about: Changing or stopping your regular medicines. This is especially important if you are taking diabetes medicines or blood thinners. Taking medicines such as aspirin and ibuprofen. These medicines can thin your blood. Do not take these medicines before your procedure if your health care provider instructs you not to. Follow instructions from your health care provider about eating or drinking restrictions. Plan to have someone take you home after the procedure. If you go home right after the procedure, plan to have someone with you for 24 hours. What happens during the procedure? You will be given one or more of the following: A medicine to help you relax (sedative). A medicine to numb the area (local anesthetic). You will be awake during the procedure. You will need to be able to talk with the health care provider during the procedure. With the help of a type of X-ray (fluoroscopy), the health care provider will insert a radiofrequency needle into the area to be treated. Next, a wire that carries the radio waves (electrode) will   be put through the radiofrequency needle. An electrical pulse will be sent through the electrode to verify the correct nerve. You will feel a tingling sensation, and you may have muscle twitching. Then, the tissue that  is around the needle tip will be heated by an electric current that is passed using the radiofrequency machine. This will numb the nerves. A bandage (dressing) will be put on the insertion area after the procedure is done. The procedure may vary among health care providers and hospitals. What happens after the procedure? Your blood pressure, heart rate, breathing rate, and blood oxygen level will be monitored often until the medicines you were given have worn off. Return to your normal activities as directed by your health care provider. This information is not intended to replace advice given to you by your health care provider. Make sure you discuss any questions you have with your health care provider. Document Released: 08/10/2011 Document Revised: 05/19/2016 Document Reviewed: 01/19/2015 Elsevier Interactive Patient Education  2018 [employer] .  

## 2017-10-31 ENCOUNTER — Telehealth: Payer: Self-pay | Admitting: *Deleted

## 2017-10-31 NOTE — Telephone Encounter (Signed)
Spoke with patient re; procedure on yesterday. Denies any questions or concerns.  

## 2017-11-20 ENCOUNTER — Ambulatory Visit (HOSPITAL_BASED_OUTPATIENT_CLINIC_OR_DEPARTMENT_OTHER)
Payer: No Typology Code available for payment source | Admitting: Student in an Organized Health Care Education/Training Program

## 2017-11-20 ENCOUNTER — Encounter: Payer: Self-pay | Admitting: Student in an Organized Health Care Education/Training Program

## 2017-11-20 ENCOUNTER — Ambulatory Visit
Admission: RE | Admit: 2017-11-20 | Discharge: 2017-11-20 | Disposition: A | Discharge status: Home / Self Care | Payer: No Typology Code available for payment source | Source: Ambulatory Visit | Attending: Student in an Organized Health Care Education/Training Program | Admitting: Student in an Organized Health Care Education/Training Program

## 2017-11-20 DIAGNOSIS — N4400 Torsion of testis, unspecified: Secondary | ICD-10-CM | POA: Diagnosis not present

## 2017-11-20 DIAGNOSIS — M47816 Spondylosis without myelopathy or radiculopathy, lumbar region: Secondary | ICD-10-CM | POA: Diagnosis not present

## 2017-11-20 MED ORDER — DEXAMETHASONE SODIUM PHOSPHATE 10 MG/ML IJ SOLN
10.0000 mg | Freq: Once | INTRAMUSCULAR | Status: AC
  Administered 2017-11-20: 10 mg
  Filled 2017-11-20: qty 1

## 2017-11-20 MED ORDER — LACTATED RINGERS IV SOLN
1000.0000 mL | Freq: Once | INTRAVENOUS | Status: AC
  Administered 2017-11-20: 1000 mL via INTRAVENOUS

## 2017-11-20 MED ORDER — FENTANYL CITRATE (PF) 100 MCG/2ML IJ SOLN
25.0000 ug | INTRAMUSCULAR | Status: DC | PRN
  Administered 2017-11-20: 100 ug via INTRAVENOUS
  Filled 2017-11-20: qty 2

## 2017-11-20 MED ORDER — LIDOCAINE HCL (PF) 1 % IJ SOLN
10.0000 mL | Freq: Once | INTRAMUSCULAR | Status: AC
  Administered 2017-11-20: 5 mL
  Filled 2017-11-20: qty 10

## 2017-11-20 MED ORDER — ROPIVACAINE HCL 2 MG/ML IJ SOLN
9.0000 mL | Freq: Once | INTRAMUSCULAR | Status: AC
  Administered 2017-11-20: 10 mL via PERINEURAL
  Filled 2017-11-20: qty 10

## 2017-11-20 NOTE — Patient Instructions (Signed)
Post RF Discharge Instructions  You have just completed Radiofrequency Neurotomy.  The following instructions will provide you with information and guidelines for self-care upon discharge.  If at any time you have questions or concerns please call your physician.  General Instructions:  Be alert for signs of possible infection: redness, swelling, heat, red streaks, elevated temperature, fever.  Please notify your doctor immediately if you have unusual bleeding, trouble breathing, or loss of the ability to control your bowel or bladder.  What to expect:  Most procedures involve the use of local anesthetics (numbing medicine), steroids (anti-inflammatory medicines) and possibly sedation (relaxation or nerve medicine).  Sedation may affect your memory, not allowing you to remember the procedure, or the instructions that we give you after it.  Because of this, you doctor may want to avoid providing you important information after the procedure, since you may not remember.  The doctor will be more than happy to go over the information upon your return.  Local anesthetics, on the other hand, may cause temporary numbness and weakness of the legs or arms, depending on the location of the block.  This numbness/weaknes may last 4-6 hours ( the duration of the local anesthetic).  During this period of numbness, you must be more careful than usual to prevent any injuries to th extremity.  Steroids will begin to work immediately after injected, but on the average, it will take 6-10 days for the swelling to come down to the point where you will be able to tell a difference in terms of the pain.  In summary, you should expect for your pain to get better within 15-20 minutes after the procedure.  This relieve or numbness should last 4-6 hours, after which, it will wear off.  Once it wears off, you may experience more pain than usual for 4-6 days, or until the steroids "kick in".  This discomfort is due to the procedure  itself.  To minimize this, we recommend applying ice (fill a plastic sandwich bag with ice and wrap it on a towel to prevent frostbite) to the area, 15 minutes on and 15 minutes off, the day of the procedure.  This will minimize any swelling.  Starting the next day, you should then start with heat (moist or dry, it does not matter).  Heat therapy should continue until the pain improves (4-6 days).  Be careful not to burn yourself.  In the case of Radiofrequency procedures, you should expect more pain than usual for 5 to 6 weeks after the procedure.  This is how long it takes the burned tissue to heal.  We cannot assess any definite results on the success of the procedure until this recovery period has elapsed.  Post-Procedure Care:  You will want to be careful in moving about.  Muscle spasms in the area of the injection may occur.  Use ice or heat to the area is often helpful.   Occasionally, a spinal headache can develop.  This is different from a normal headache in the sense that it will not go away on it's own, despite normal measures.  If you develop a headache that does not seem to respond to conservative therapy, please let your physician know.  This can be treated with an epidural blood patch.   You may experience some numbness or redness,m however it should be short lived.  If persistent numbness occurs, contact your physician. (Persistent numbness would be defined as lasting more than 4-6 hours).  Use care in   moving the effected arm or leg to avoid further injury.  You may be given a sling or crutches to use temporarily.  Some discoloration may be present in your arms or leg for 24-48 hours.  If you experience shortness of breath or if your lips or fingers develop a dusky or "blue" appearance, contact your physician or go to the emergency room.  Diet  No eating limitations, unless stipulated above.  Nevertheless, if you have had sedation, you may experience some nausea.  In this case, it may be  wise to wait at least two hours prior to resuming regular diet.  Comfort  You may experience a temporary increase in pain.  You will also experience tenderness at the site of injection, which may be accompanied with muscle spasms.  Use of cold or heat is usually helpful.  Take your prescription as directed.  Always exercise caution when taking pain pills.  Activity:  For the first 24 hours after the procedure, do not drive a motor vehicle,  Operate heavy machinery or power tools or handle any weapons.  Consider walking with the use of an assistive device or accompanied by an adult for the next 24 hours.  Do not drink alcoholic beverages including beer.  Do not make any important decisions or sign any legal documents. Go home and rest today.  Resume activities tomorrow, as tolerated.  Use caution in moving about as you may experience mild leg weakness.  Use caution in cooking, use of household electrical appliances and climbing steps.  Medications  May resume pre-procedure medications.  Do not take any drugs, other than what has been prescribed to you.   Other Pain Management Discharge Instructions  General Discharge Instructions :  If you need to reach your doctor call: Monday-Friday 8:00 am - 4:00 pm at [phone] or toll free [phone].  After clinic hours [phone] to have operator reach doctor.  Bring all of your medication bottles to all your appointments in the pain clinic.  To cancel or reschedule your appointment with Pain Management please remember to call 24 hours in advance to avoid a fee.  Refer to the educational materials which you have been given on: General Risks, I had my Procedure. Discharge Instructions, Post Sedation.  Post Procedure Instructions:  The drugs you were given will stay in your system until tomorrow, so for the next 24 hours you should not drive, make any legal decisions or drink any alcoholic beverages.  You may eat anything you prefer, but it  is better to start with liquids then soups and crackers, and gradually work up to solid foods.  Please notify your doctor immediately if you have any unusual bleeding, trouble breathing or pain that is not related to your normal pain.  Depending on the type of procedure that was done, some parts of your body may feel week and/or numb.  This usually clears up by tonight or the next day.  Walk with the use of an assistive device or accompanied by an adult for the 24 hours.  You may use ice on the affected area for the first 24 hours.  Put ice in a Ziploc bag and cover with a towel and place against area 15 minutes on 15 minutes off.  You may switch to heat after 24 hours.

## 2017-11-20 NOTE — Progress Notes (Signed)
Safety precautions to be maintained throughout the outpatient stay will include: orient to surroundings, keep bed in low position, maintain call bell within reach at all times, provide assistance with transfer out of bed and ambulation.  

## 2017-11-20 NOTE — Progress Notes (Signed)
Patient's Name: John Hartman  MRN: [id number]  Referring Provider: Gillis Santa, MD  DOB: 17-Feb-1971  PCP: Arnetha Courser, MD  DOS: 11/20/2017  Note by: Gillis Santa, MD  Service setting: Ambulatory outpatient  Specialty: Interventional Pain Management  Patient type: Established  Location: [facility] (AMB) Pain Management Facility  Visit type: Interventional Procedure   Primary Reason for Visit: Interventional Pain Management Treatment. CC: Back Pain (lower back left side)  Procedure:  Anesthesia, Analgesia, Anxiolysis:  Type: Therapeutic Medial Branch Facet Radiofrequency Ablation Region: Lumbar Level:  L3, L4, L5,  Medial Branch Level(s) Laterality: Left-Sided  Type: Local Anesthesia with Moderate (Conscious) Sedation Local Anesthetic: Lidocaine 1% Route: Intravenous (IV) IV Access: Secured Sedation: Meaningful verbal contact was maintained at all times during the procedure  Indication(s): Analgesia and Anxiety   Indications: 1. Spondylosis of lumbar region without myelopathy or radiculopathy    John Hartman has either failed to respond, was unable to tolerate, or simply did not get enough benefit from other more conservative therapies including, but not limited to: 1. Over-the-counter medications 2. Anti-inflammatory medications 3. Muscle relaxants 4. Membrane stabilizers 5. Opioids 6. Physical therapy 7. Modalities (Heat, ice, etc.) 8. Invasive techniques such as nerve blocks.  John Hartman has attained more than 50% relief of the pain from a series of diagnostic injections conducted in separate occasions.  Pain Score: Pre-procedure: 5 /10 Post-procedure: 0-No pain/10  Pre-op Assessment:  John Hartman is a 46 y.o. (year old), male patient, seen today for interventional treatment. He  has a past surgical history that includes Shoulder surgery (Left, 2007); Nasal septum surgery (2004); ACDF with fusion (2007); Testicular torsion (1980s); Vasectomy; Colonoscopy with propofol (N/A, 06/26/2015);  and Cardiac catheterization (Left, 08/15/2016). John Hartman has a current medication list which includes the following prescription(s): accu-chek softclix lancets, allopurinol, amitriptyline, atenolol, belladonna-phenobarbital, cetirizine, colchicine, diphenhydramine, epinephrine, glucose blood, glucose blood, insulin degludec, insulin pen needle, melatonin, naproxen sodium, ranitidine, tizanidine, cyclobenzaprine, and metformin, and the following Facility-Administered Medications: fentanyl. His primarily concern today is the Back Pain (lower back left side)  Initial Vital Signs: Blood pressure (!) 155/103, pulse 60, temperature 98.4 F (36.9 C), resp. rate 18, height 5\' 11"  (1.803 m), weight 230 lb (104.3 kg), SpO2 98 %. BMI: Estimated body mass index is 31.19 kg/m as calculated from the following:   Height as of this encounter: 6' (1.829 m).   Weight as of this encounter: 230 lb (104.3 kg).  Risk Assessment: Allergies: Reviewed. He is allergic to lisinopril and ace inhibitors.  Allergy Precautions: None required Coagulopathies: Reviewed. None identified.  Blood-thinner therapy: None at this time Active Infection(s): Reviewed. None identified. John Hartman is afebrile  Site Confirmation: John Hartman was asked to confirm the procedure and laterality before marking the site Procedure checklist: Completed Consent: Before the procedure and under the influence of no sedative(s), amnesic(s), or anxiolytics, the patient was informed of the treatment options, risks and possible complications. To fulfill our ethical and legal obligations, as recommended by the American Medical Association's Code of Ethics, I have informed the patient of my clinical impression; the nature and purpose of the treatment or procedure; the risks, benefits, and possible complications of the intervention; the alternatives, including doing nothing; the risk(s) and benefit(s) of the alternative treatment(s) or procedure(s); and the risk(s)  and benefit(s) of doing nothing. The patient was provided information about the general risks and possible complications associated with the procedure. These may include, but are not limited to: failure to achieve desired goals, infection, bleeding,  organ or nerve damage, allergic reactions, paralysis, and death. In addition, the patient was informed of those risks and complications associated to Spine-related procedures, such as failure to decrease pain; infection (i.e.: Meningitis, epidural or intraspinal abscess); bleeding (i.e.: epidural hematoma, subarachnoid hemorrhage, or any other type of intraspinal or peri-dural bleeding); organ or nerve damage (i.e.: Any type of peripheral nerve, nerve root, or spinal cord injury) with subsequent damage to sensory, motor, and/or autonomic systems, resulting in permanent pain, numbness, and/or weakness of one or several areas of the body; allergic reactions; (i.e.: anaphylactic reaction); and/or death. Furthermore, the patient was informed of those risks and complications associated with the medications. These include, but are not limited to: allergic reactions (i.e.: anaphylactic or anaphylactoid reaction(s)); adrenal axis suppression; blood sugar elevation that in diabetics may result in ketoacidosis or comma; water retention that in patients with history of congestive heart failure may result in shortness of breath, pulmonary edema, and decompensation with resultant heart failure; weight gain; swelling or edema; medication-induced neural toxicity; particulate matter embolism and blood vessel occlusion with resultant organ, and/or nervous system infarction; and/or aseptic necrosis of one or more joints. Finally, the patient was informed that Medicine is not an exact science; therefore, there is also the possibility of unforeseen or unpredictable risks and/or possible complications that may result in a catastrophic outcome. The patient indicated having understood very  clearly. We have given the patient no guarantees and we have made no promises. Enough time was given to the patient to ask questions, all of which were answered to the patient's satisfaction. John Hartman has indicated that he wanted to continue with the procedure. Attestation: I, the ordering provider, attest that I have discussed with the patient the benefits, risks, side-effects, alternatives, likelihood of achieving goals, and potential problems during recovery for the procedure that I have provided informed consent. Date: 11/20/2017; Time: 8:44 AM  Pre-Procedure Preparation:  Monitoring: As per clinic protocol. Respiration, ETCO2, SpO2, BP, heart rate and rhythm monitor placed and checked for adequate function Safety Precautions: Patient was assessed for positional comfort and pressure points before starting the procedure. Time-out: I initiated and conducted the "Time-out" before starting the procedure, as per protocol. The patient was asked to participate by confirming the accuracy of the "Time Out" information. Verification of the correct person, site, and procedure were performed and confirmed by me, the nursing staff, and the patient. "Time-out" conducted as per Joint Commission's Universal Protocol (UP.01.01.01). "Time-out" Date & Time: 11/20/2017; 0845 hrs.  Description of Procedure Process:   Position: Prone Target Area: For Lumbar Facet blocks, the target is the groove formed by the junction of the transverse process and superior articular process. For the L5 dorsal ramus, the target is the notch between superior articular process and sacral ala.  Approach: Paraspinal approach. Area Prepped: Entire Posterior Lumbosacral Region Prepping solution: Hibiclens (4.0% Chlorhexidine gluconate solution) Safety Precautions: Aspiration looking for blood return was conducted prior to all injections. At no point did we inject any substances, as a needle was being advanced. No attempts were made at  seeking any paresthesias. Safe injection practices and needle disposal techniques used. Medications properly checked for expiration dates. SDV (single dose vial) medications used. Description of the Procedure: Protocol guidelines were followed. The patient was placed in position over the procedure table. The target area was identified and the area prepped in the usual manner. The skin and muscle were infiltrated with local anesthetic. Appropriate amount of time allowed to pass for local anesthetics to  take effect. Radiofrequency needles were introduced to the target area using fluoroscopic guidance. Using the NeuroTherm NT1100 Radiofrequency Generator, sensory stimulation using 50 Hz was used to locate & identify the nerve, making sure that the needle was positioned such that there was no sensory stimulation below 0.3 V or above 0.7 V. Stimulation using 2 Hz was used to evaluate the motor component. Care was taken not to lesion any nerves that demonstrated motor stimulation of the lower extremities at an output of less than 2.5 times that of the sensory threshold, or a maximum of 2.0 V. Once satisfactory placement of the needles was achieved, the numbing solution was slowly injected after negative aspiration. After waiting for at least 2 minutes, the ablation was performed at 80 degrees C for 60 seconds, using regular Radiofrequency settings. Once the procedure was completed, the needles were then removed and the area cleansed, making sure to leave some of the prepping solution back to take advantage of its long term bactericidal properties. Intra-operative Compliance: Compliant  Illustration of the posterior view of the lumbar spine and the posterior neural structures. Laminae of L2 through S1 are labeled. DPRL5, dorsal primary ramus of L5; DPRS1, dorsal primary ramus of S1; DPR3, dorsal primary ramus of L3; FJ, facet (zygapophyseal) joint L3-L4; I, inferior articular process of L4; LB1, lateral branch of  dorsal primary ramus of L1; IAB, inferior articular branches from L3 medial branch (supplies L4-L5 facet joint); IBP, intermediate branch plexus; MB3, medial branch of dorsal primary ramus of L3; NR3, third lumbar nerve root; S, superior articular process of L5; SAB, superior articular branches from L4 (supplies L4-5 facet joint also); TP3, transverse process of L3.  Vitals:   11/20/17 0907 11/20/17 0917 11/20/17 0927 11/20/17 0937  BP: (!) 148/113 (!) 145/109 (!) 133/104 (!) 132/93  Pulse: 65 (!) 53 61 (!) 56  Resp: 14 15 16 16   Temp:  97.9 F (36.6 C)    TempSrc:      SpO2: 99% 97% 99% 98%  Weight:      Height:        Start Time: 0850 hrs. End Time: 0907 hrs. Materials & Medications:  Needle(s) Type: Teflon-coated, curved tip, Radiofrequency needle(s) Gauge: 22G Length: 10cm Medication(s): We administered lactated ringers, fentaNYL, ropivacaine (PF) 2 mg/mL (0.2%), lidocaine (PF), and dexamethasone. Please see chart orders for dosing details. 0.3 cc of Decadron (10 mg/cc) injected at each level post ablation flushed with 1 cc of 1% lidocaine and then needles were removed.  Imaging Guidance (Spinal):  Type of Imaging Technique: Fluoroscopy Guidance (Spinal) Indication(s): Assistance in needle guidance and placement for procedures requiring needle placement in or near specific anatomical locations not easily accessible without such assistance. Exposure Time: Please see nurses notes. Contrast: None used. Fluoroscopic Guidance: I was personally present during the use of fluoroscopy. "Tunnel Vision Technique" used to obtain the best possible view of the target area. Parallax error corrected before commencing the procedure. "Direction-depth-direction" technique used to introduce the needle under continuous pulsed fluoroscopy. Once target was reached, antero-posterior, oblique, and lateral fluoroscopic projection used confirm needle placement in all planes. Images permanently stored in  EMR. Interpretation: No contrast injected. I personally interpreted the imaging intraoperatively. Adequate needle placement confirmed in multiple planes. Permanent images saved into the patient's record.   Antibiotic Prophylaxis:  Indication(s): None identified Antibiotic given: None  Post-operative Assessment:  EBL: None Complications: No immediate post-treatment complications observed by team, or reported by patient. Note: The patient tolerated the entire procedure well.  A repeat set of vitals were taken after the procedure and the patient was kept under observation following institutional policy, for this type of procedure. Post-procedural neurological assessment was performed, showing return to baseline, prior to discharge. The patient was provided with post-procedure discharge instructions, including a section on how to identify potential problems. Should any problems arise concerning this procedure, the patient was given instructions to immediately contact us, at any time, without hesitation. In any case, we plan to contact the patient by telephone for a follow-up status report regarding this interventional procedure. Comments:  No additional relevant information. 5 out of 5 strength bilateral lower extremity: Plantar flexion, dorsiflexion, knee flexion, knee extension.  Plan of Care    Imaging Orders     DG C-Arm 1-60 Min-No Report Procedure Orders    No procedure(s) ordered today    Medications ordered for procedure: Meds ordered this encounter  Medications  . lactated ringers infusion 1,000 mL  . fentaNYL (SUBLIMAZE) injection 25-100 mcg    Make sure Narcan is available in the pyxis when using this medication. In the event of respiratory depression (RR< 8/min): Titrate NARCAN (naloxone) in increments of 0.1 to 0.2 mg IV at 2-3 minute intervals, until desired degree of reversal.  . ropivacaine (PF) 2 mg/mL (0.2%) (NAROPIN) injection 9 mL  . lidocaine (PF) (XYLOCAINE) 1 %  injection 10 mL  . dexamethasone (DECADRON) injection 10 mg   Medications administered: We administered lactated ringers, fentaNYL, ropivacaine (PF) 2 mg/mL (0.2%), lidocaine (PF), and dexamethasone.  See the medical record for exact dosing, route, and time of administration.   Disposition: Discharge home  Discharge Date & Time: 11/20/2017; 0940 hrs.   Physician-requested Follow-up: Return in about 6 weeks (around 01/01/2018) for Post Procedure Evaluation. Future Appointments  Date Time Provider [place]  01/02/2018  8:45 AM Gillis Santa, MD [facility] None  08/08/2018 11:00 AM Lada, Satira Anis, MD [facility]   Primary Care Physician: Arnetha Courser, MD Location: [facility] Note by: Gillis Santa, MD Date: 11/20/2017; Time: 1:59 PM  Disclaimer:  Medicine is not an exact science. The only guarantee in medicine is that nothing is guaranteed. It is important to note that the decision to proceed with this intervention was based on the information collected from the patient. The Data and conclusions were drawn from the patient's questionnaire, the interview, and the physical examination. Because the information was provided in large part by the patient, it cannot be guaranteed that it has not been purposely or unconsciously manipulated. Every effort has been made to obtain as much relevant data as possible for this evaluation. It is important to note that the conclusions that lead to this procedure are derived in large part from the available data. Always take into account that the treatment will also be dependent on availability of resources and existing treatment guidelines, considered by other Pain Management Practitioners as being common knowledge and practice, at the time of the intervention. For Medico-Legal purposes, it is also important to point out that variation in procedural techniques and pharmacological choices are the acceptable norm. The  indications, contraindications, technique, and results of the above procedure should only be interpreted and judged by a Board-Certified Interventional Pain Specialist with extensive familiarity and expertise in the same exact procedure and technique.

## 2017-11-21 ENCOUNTER — Telehealth: Payer: Self-pay | Admitting: *Deleted

## 2017-11-21 NOTE — Telephone Encounter (Signed)
Patient denies any questions or concerns re; procedure on yesterday. 

## 2017-11-23 NOTE — Telephone Encounter (Signed)
Patient requesting refill of Zanaflex. Dr. Holley Raring ok to refill. Spoke wth Apolonio Schneiders at [place] [phone].

## 2018-01-02 ENCOUNTER — Ambulatory Visit
Admission: RE | Admit: 2018-01-02 | Discharge: 2018-01-02 | Disposition: A | Discharge status: Home / Self Care | Payer: 59 | Source: Ambulatory Visit | Attending: Student in an Organized Health Care Education/Training Program | Admitting: Student in an Organized Health Care Education/Training Program

## 2018-01-02 ENCOUNTER — Other Ambulatory Visit: Payer: Self-pay

## 2018-01-02 ENCOUNTER — Ambulatory Visit
Payer: 59 | Attending: Student in an Organized Health Care Education/Training Program | Admitting: Student in an Organized Health Care Education/Training Program

## 2018-01-02 ENCOUNTER — Encounter: Payer: Self-pay | Admitting: Student in an Organized Health Care Education/Training Program

## 2018-01-02 VITALS — BP 160/90 | HR 65 | Temp 98.7°F | Resp 18 | Ht 71.0 in | Wt 235.0 lb

## 2018-01-02 DIAGNOSIS — M109 Gout, unspecified: Secondary | ICD-10-CM | POA: Diagnosis not present

## 2018-01-02 DIAGNOSIS — G8929 Other chronic pain: Secondary | ICD-10-CM | POA: Insufficient documentation

## 2018-01-02 DIAGNOSIS — Z79899 Other long term (current) drug therapy: Secondary | ICD-10-CM | POA: Diagnosis not present

## 2018-01-02 DIAGNOSIS — K219 Gastro-esophageal reflux disease without esophagitis: Secondary | ICD-10-CM | POA: Diagnosis not present

## 2018-01-02 DIAGNOSIS — Z87891 Personal history of nicotine dependence: Secondary | ICD-10-CM | POA: Insufficient documentation

## 2018-01-02 DIAGNOSIS — I6522 Occlusion and stenosis of left carotid artery: Secondary | ICD-10-CM | POA: Insufficient documentation

## 2018-01-02 DIAGNOSIS — M545 Low back pain, unspecified: Secondary | ICD-10-CM

## 2018-01-02 DIAGNOSIS — I700 Atherosclerosis of aorta: Secondary | ICD-10-CM | POA: Insufficient documentation

## 2018-01-02 DIAGNOSIS — M8938 Hypertrophy of bone, other site: Secondary | ICD-10-CM | POA: Diagnosis not present

## 2018-01-02 DIAGNOSIS — E119 Type 2 diabetes mellitus without complications: Secondary | ICD-10-CM | POA: Insufficient documentation

## 2018-01-02 DIAGNOSIS — M5136 Other intervertebral disc degeneration, lumbar region: Secondary | ICD-10-CM | POA: Diagnosis not present

## 2018-01-02 DIAGNOSIS — Z794 Long term (current) use of insulin: Secondary | ICD-10-CM | POA: Insufficient documentation

## 2018-01-02 DIAGNOSIS — M4316 Spondylolisthesis, lumbar region: Secondary | ICD-10-CM | POA: Insufficient documentation

## 2018-01-02 DIAGNOSIS — I2510 Atherosclerotic heart disease of native coronary artery without angina pectoris: Secondary | ICD-10-CM | POA: Insufficient documentation

## 2018-01-02 DIAGNOSIS — Z981 Arthrodesis status: Secondary | ICD-10-CM

## 2018-01-02 DIAGNOSIS — N200 Calculus of kidney: Secondary | ICD-10-CM | POA: Insufficient documentation

## 2018-01-02 DIAGNOSIS — M4803 Spinal stenosis, cervicothoracic region: Secondary | ICD-10-CM | POA: Insufficient documentation

## 2018-01-02 DIAGNOSIS — K589 Irritable bowel syndrome without diarrhea: Secondary | ICD-10-CM | POA: Insufficient documentation

## 2018-01-02 DIAGNOSIS — M4804 Spinal stenosis, thoracic region: Secondary | ICD-10-CM | POA: Diagnosis not present

## 2018-01-02 DIAGNOSIS — M47894 Other spondylosis, thoracic region: Secondary | ICD-10-CM | POA: Diagnosis not present

## 2018-01-02 DIAGNOSIS — M47816 Spondylosis without myelopathy or radiculopathy, lumbar region: Secondary | ICD-10-CM | POA: Insufficient documentation

## 2018-01-02 DIAGNOSIS — M1288 Other specific arthropathies, not elsewhere classified, other specified site: Secondary | ICD-10-CM | POA: Diagnosis not present

## 2018-01-02 DIAGNOSIS — E785 Hyperlipidemia, unspecified: Secondary | ICD-10-CM | POA: Insufficient documentation

## 2018-01-02 DIAGNOSIS — R748 Abnormal levels of other serum enzymes: Secondary | ICD-10-CM | POA: Insufficient documentation

## 2018-01-02 DIAGNOSIS — I10 Essential (primary) hypertension: Secondary | ICD-10-CM | POA: Diagnosis not present

## 2018-01-02 MED ORDER — AMITRIPTYLINE HCL 50 MG PO TABS: 50.0000 mg | ORAL_TABLET | Freq: Every day | ORAL | 3 refills | Status: DC

## 2018-01-02 MED ORDER — TIZANIDINE HCL 4 MG PO TABS: 4.0000 mg | ORAL_TABLET | Freq: Three times a day (TID) | ORAL | 1 refills | Status: DC | PRN

## 2018-01-02 NOTE — Patient Instructions (Addendum)
1. Xrays of thoracic, lumbar and cervical spine 2. Refill on Amitriptyline  Trigger Point Injection Trigger points are areas where you have pain. A trigger point injection is a shot given in the trigger point to help relieve pain for a few days to a few months. Common places for trigger points include:  The neck.  The shoulders.  The upper back.  The lower back.  A trigger point injection will not cure long-lasting (chronic) pain permanently. These injections do not always work for every person, but for some people they can help to relieve pain for a few days to a few months. Tell a health care provider about:  Any allergies you have.  All medicines you are taking, including vitamins, herbs, eye drops, creams, and over-the-counter medicines.  Any problems you or family members have had with anesthetic medicines.  Any blood disorders you have.  Any surgeries you have had.  Any medical conditions you have. What are the risks? Generally, this is a safe procedure. However, problems may occur, including:  Infection.  Bleeding.  Allergic reaction to the injected medicine.  Irritation of the skin around the injection site.  What happens before the procedure?  Ask your health care provider about changing or stopping your regular medicines. This is especially important if you are taking diabetes medicines or blood thinners. What happens during the procedure?  Your health care provider will feel for trigger points. A marker may be used to circle the area for the injection.  The skin over the trigger point will be washed with a germ-killing (antiseptic) solution.  A thin needle is used for the shot. You may feel pain or a twitching feeling when the needle enters the trigger point.  A numbing solution may be injected into the trigger point. Sometimes a medicine to keep down swelling, redness, and warmth (inflammation) is also injected.  Your health care provider may move the  needle around the area where the trigger point is located until the tightness and twitching goes away.  After the injection, your health care provider may put gentle pressure over the injection site.  The injection site will be covered with a bandage (dressing). The procedure may vary among health care providers and hospitals. What happens after the procedure?  The dressing can be taken off in a few hours or as told by your health care provider.  You may feel sore and stiff for 1-2 days. This information is not intended to replace advice given to you by your health care provider. Make sure you discuss any questions you have with your health care provider. Document Released: 12/01/2011 Document Revised: 08/14/2016 Document Reviewed: 06/01/2015 [employer] Interactive Patient Education  2018 [employer].

## 2018-01-02 NOTE — Progress Notes (Signed)
Patient's Name: John Hartman  MRN: [id number]  Referring Provider: Arnetha Courser, MD  DOB: August 18, 1971  PCP: Arnetha Courser, MD  DOS: 01/02/2018  Note by: Gillis Santa, MD  Service setting: Ambulatory outpatient  Specialty: Interventional Pain Management  Location: [facility] (AMB) Pain Management Facility    Patient type: Established   Primary Reason(s) for Visit: Encounter for post-procedure evaluation of chronic illness with mild to moderate exacerbation CC: Back Pain (mid, and low)  HPI  Mr. Scheel is a 47 y.o. year old, male patient, who comes today for a post-procedure evaluation. He has Essential hypertension; Gout; IBS (irritable bowel syndrome); Family history of malignant neoplasm of gastrointestinal tract; Benign neoplasm of rectosigmoid junction; Palpitations; Decongestant abuse; Ketonuria; Glucosuria; Preventative health care; Chest pain; Diabetes mellitus ([place]); Encounter for medication monitoring; Coronary artery disease; Angioedema; Hyperlipidemia LDL goal <70; Elevated serum glutamic pyruvic transaminase (SGPT) level; Elevated liver enzymes; Serum total bilirubin elevated; Fatty liver; Chronic lower back pain; Degenerative disc disease, lumbar; Arthritis, lumbar spine; Calcification of abdominal aorta (HCC); and Elevated hemoglobin (HCC) on their problem list. His primarily concern today is the Back Pain (mid, and low)  Pain Assessment: Location: Mid Back Radiating: denies Onset: More than a month ago Duration: Chronic pain Quality: Tightness Severity: 4 /10 (self-reported pain score)  Note: Reported level is inconsistent with clinical observations. Clinically the patient looks like a 2/10             When using our objective Pain Scale, levels between 6 and 10/10 are said to belong in an emergency room, as it progressively worsens from a 6/10, described as severely limiting, requiring emergency care not usually available at an outpatient pain management facility. At a 6/10 level,  communication becomes difficult and requires great effort. Assistance to reach the emergency department may be required. Facial flushing and profuse sweating along with potentially dangerous increases in heart rate and blood pressure will be evident. Effect on ADL:   Timing: Constant Modifying factors: denies  Mr. Enriquez comes in today for post-procedure evaluation after the treatment done on 11/20/2017.  Further details on both, my assessment(s), as well as the proposed treatment plan, please see below.  Post-Procedure Assessment  11/20/2017 Procedure: Left L3-L5 RFA Pre-procedure pain score:  5/10 Post-procedure pain score: 0/10         Influential Factors: BMI: 32.78 kg/m Intra-procedural challenges: None observed.         Assessment challenges: None detected.              Reported side-effects: None.        Post-procedural adverse reactions or complications: None reported         Sedation: Please see nurses note. When no sedatives are used, the analgesic levels obtained are directly associated to the effectiveness of the local anesthetics. However, when sedation is provided, the level of analgesia obtained during the initial 1 hour following the intervention, is believed to be the result of a combination of factors. These factors may include, but are not limited to: 1. The effectiveness of the local anesthetics used. 2. The effects of the analgesic(s) and/or anxiolytic(s) used. 3. The degree of discomfort experienced by the patient at the time of the procedure. 4. The patients ability and reliability in recalling and recording the events. 5. The presence and influence of possible secondary gains and/or psychosocial factors. Reported result: Relief experienced during the 1st hour after the procedure: 100 % (Ultra-Short Term Relief)  Interpretative annotation: Clinically appropriate result. Analgesia during this period is likely to be Local Anesthetic and/or IV Sedative  (Analgesic/Anxiolytic) related.          Effects of local anesthetic: The analgesic effects attained during this period are directly associated to the localized infiltration of local anesthetics and therefore cary significant diagnostic value as to the etiological location, or anatomical origin, of the pain. Expected duration of relief is directly dependent on the pharmacodynamics of the local anesthetic used. Long-acting (4-6 hours) anesthetics used.  Reported result: Relief during the next 4 to 6 hour after the procedure: 100 % (Short-Term Relief)            Interpretative annotation: Clinically appropriate result. Analgesia during this period is likely to be Local Anesthetic-related.          Long-term benefit: Defined as the period of time past the expected duration of local anesthetics (1 hour for short-acting and 4-6 hours for long-acting). With the possible exception of prolonged sympathetic blockade from the local anesthetics, benefits during this period are typically attributed to, or associated with, other factors such as analgesic sensory neuropraxia, antiinflammatory effects, or beneficial biochemical changes provided by agents other than the local anesthetics.  Reported result: Extended relief following procedure: 70 % (Long-Term Relief)            Interpretative annotation: Clinically appropriate result. Good relief. Therapeutic success. Inflammation plays a part in the etiology to the pain.          Current benefits: Defined as reported results that persistent at this point in time.   Analgesia: 50-75 % Mr. Trudo reports the axial pain improved more than the extremity pain. Function: Somewhat improved ROM: Somewhat improved Interpretative annotation: Ongoing benefit. Therapeutic benefit observed. Effective therapeutic approach.          Interpretation: Results would suggest a successful diagnostic and therapeutic intervention.                  Plan:  Please see "Plan of Care" for  details.        Laboratory Chemistry  Inflammation Markers (CRP: Acute Phase) (ESR: Chronic Phase) No results found for: CRP, ESRSEDRATE, LATICACIDVEN               Rheumatology Markers Lab Results  Component Value Date   RF <14 08/04/2017   ANA Negative 09/21/2017   LABURIC 6.4 08/04/2017                Renal Function Markers Lab Results  Component Value Date   BUN 19 08/04/2017   CREATININE 1.20 08/04/2017   GFRAA 85 08/04/2017   GFRNONAA 73 08/04/2017                 Hepatic Function Markers Lab Results  Component Value Date   AST 90 (H) 09/21/2017   ALT 153 (H) 09/21/2017   ALBUMIN 4.9 09/21/2017   ALKPHOS 99 09/21/2017                 Electrolytes Lab Results  Component Value Date   NA 139 08/04/2017   K 4.8 08/04/2017   CL 101 08/04/2017   CALCIUM 10.0 08/04/2017   MG 2.0 08/27/2016   PHOS 1.9 (L) 08/27/2016                 Neuropathy Markers Lab Results  Component Value Date   HGBA1C  07/08/2016    UNABLE TO REPORT A1C DUE TO UNKNOWN INTERFERING FACTOR  Bone Pathology Markers Lab Results  Component Value Date   VD25OH 30 08/04/2017                 Coagulation Parameters Lab Results  Component Value Date   INR 0.96 08/11/2016   LABPROT 12.8 08/11/2016   PLT 195 08/04/2017                 Cardiovascular Markers Lab Results  Component Value Date   TROPONINI <0.03 07/08/2016   HGB 17.2 (H) 08/04/2017   HCT 51.1 (H) 08/04/2017                 CA Markers No results found for: CEA, CA125, LABCA2               Note: Lab results reviewed.  Recent Diagnostic Imaging Results  DG C-Arm 1-60 Min-No Report Fluoroscopy was utilized by the requesting physician.  No radiographic  interpretation.   Complexity Note: Imaging results reviewed. Results shared with Mr. Halbert, using Layman's terms.                         Meds   Current Outpatient Medications:  .  ACCU-CHEK SOFTCLIX LANCETS lancets, USE AS DIRECTED, Disp: 100  each, Rfl: 0 .  allopurinol (ZYLOPRIM) 100 MG tablet, Take 100 mg by mouth every morning. , Disp: , Rfl:  .  atenolol (TENORMIN) 50 MG tablet, Take 1 tablet (50 mg total) by mouth daily., Disp: 90 tablet, Rfl: 3 .  belladonna-PHENObarbital (DONNATAL) 16.2 MG/5ML ELIX, Take 5 mLs (16.2 mg total) by mouth 4 (four) times daily as needed for cramping or pain. (Patient taking differently: Take 5 mLs by mouth 4 (four) times daily as needed for cramping or pain. ), Disp: 600 mL, Rfl: 1 .  cetirizine (ZYRTEC) 10 MG tablet, Take 10 mg by mouth at bedtime. , Disp: , Rfl:  .  colchicine 0.6 MG tablet, Take 0.6 mg by mouth daily as needed., Disp: , Rfl:  .  diphenhydrAMINE (BENADRYL) 25 mg capsule, Take 25 mg by mouth at bedtime. , Disp: , Rfl:  .  EPINEPHrine (EPIPEN 2-PAK) 0.3 mg/0.3 mL IJ SOAJ injection, Inject 0.3 mg into the muscle once., Disp: , Rfl:  .  glucose blood (ACCU-CHEK ACTIVE STRIPS) test strip, Use as instructed, Disp: 100 each, Rfl: 12 .  glucose blood (ACCU-CHEK AVIVA PLUS) test strip, Check FSBS three to four times per day, Disp: 100 each, Rfl: 1 .  Insulin Degludec (TRESIBA FLEXTOUCH) 100 UNIT/ML SOPN, Inject 20 Units into the skin at bedtime. , Disp: 1 pen, Rfl: 0 .  Insulin Pen Needle (B-D UF III MINI PEN NEEDLES) 31G X 5 MM MISC, Once a day with insulin, Disp: 50 each, Rfl: 3 .  Melatonin 5 MG TABS, Take 5 mg by mouth at bedtime. , Disp: , Rfl:  .  metFORMIN (GLUCOPHAGE-XR) 500 MG 24 hr tablet, Take 1,500 mg by mouth every evening. Take with supper., Disp: , Rfl:  .  naproxen sodium (ANAPROX) 220 MG tablet, Take 1-2 tablets (220-440 mg total) by mouth 2 (two) times daily with a meal., Disp: , Rfl:  .  ranitidine (ZANTAC) 150 MG capsule, Take 150 mg by mouth at bedtime. , Disp: , Rfl:  .  tiZANidine (ZANAFLEX) 4 MG tablet, Take 1 tablet (4 mg total) by mouth every 8 (eight) hours as needed for muscle spasms., Disp: 90 tablet, Rfl: 1 .  amitriptyline (ELAVIL) 50 MG tablet, Take  1 tablet  (50 mg total) by mouth at bedtime., Disp: 30 tablet, Rfl: 3 .  cyclobenzaprine (FLEXERIL) 10 MG tablet, TAKE 1 TABLET(10 MG) BY MOUTH EVERY 8 HOURS AS NEEDED FOR MUSCLE SPASMS (Patient not taking: Reported on 10/30/2017), Disp: 30 tablet, Rfl: 0  ROS  Constitutional: Denies any fever or chills Gastrointestinal: No reported hemesis, hematochezia, vomiting, or acute GI distress Musculoskeletal: Denies any acute onset joint swelling, redness, loss of ROM, or weakness Neurological: No reported episodes of acute onset apraxia, aphasia, dysarthria, agnosia, amnesia, paralysis, loss of coordination, or loss of consciousness  Allergies  Mr. Soberano is allergic to lisinopril and ace inhibitors.  PFSH  Drug: Mr. Seto  reports that he does not use drugs. Alcohol:  reports that he does not drink alcohol. Tobacco:  reports that he quit smoking about 2 years ago. His smoking use included cigarettes. He has a 10.00 pack-year smoking history. he has never used smokeless tobacco. Medical:  has a past medical history of Angioedema, DDD (degenerative disc disease), cervical, Diabetes mellitus ([place]), Fatty liver (03/31/2017), GERD (gastroesophageal reflux disease), Gout, HTN (hypertension), Hypertension, IBS (irritable bowel syndrome), Seasonal allergies, and Spinal stenosis. Surgical: Mr. Yohe  has a past surgical history that includes Shoulder surgery (Left, 2007); Nasal septum surgery (2004); ACDF with fusion (2007); Testicular torsion (1980s); Vasectomy; Colonoscopy with propofol (N/A, 06/26/2015); and Cardiac catheterization (Left, 08/15/2016). Family: family history includes Allergies in his son; Alzheimer's disease in his maternal grandmother; Arrhythmia in his mother; Breast cancer in his mother; Cancer in his maternal grandfather and mother; Diabetes in his father; Heart disease in his brother; Hypertension in his father; Skin cancer in his mother.  Constitutional Exam  General appearance: Well nourished, well  developed, and well hydrated. In no apparent acute distress Vitals:   01/02/18 0851  BP: (!) 160/90  Pulse: 65  Resp: 18  Temp: 98.7 F (37.1 C)  SpO2: 100%  Weight: 235 lb (106.6 kg)  Height: _0  (1.803 m)   BMI Assessment: Estimated body mass index is 32.78 kg/m as calculated from the following:   Height as of this encounter: _1  (1.803 m).   Weight as of this encounter: 235 lb (106.6 kg).  BMI interpretation table: BMI level Category Range association with higher incidence of chronic pain  <18 kg/m2 Underweight   18.5-24.9 kg/m2 Ideal body weight   25-29.9 kg/m2 Overweight Increased incidence by 20%  30-34.9 kg/m2 Obese (Class I) Increased incidence by 68%  35-39.9 kg/m2 Severe obesity (Class II) Increased incidence by 136%  >40 kg/m2 Extreme obesity (Class III) Increased incidence by 254%   BMI Readings from Last 4 Encounters:  01/02/18 32.78 kg/m  11/20/17 31.19 kg/m  10/30/17 32.08 kg/m  10/04/17 32.08 kg/m   Wt Readings from Last 4 Encounters:  01/02/18 235 lb (106.6 kg)  11/20/17 230 lb (104.3 kg)  10/30/17 230 lb (104.3 kg)  10/04/17 230 lb (104.3 kg)  Psych/Mental status: Alert, oriented x 3 (person, place, & time)       Eyes: PERLA Respiratory: No evidence of acute respiratory distress  Cervical Spine Area Exam  Skin & Axial Inspection: No masses, redness, edema, swelling, or associated skin lesions Alignment: Symmetrical Functional ROM: Decreased ROM      Stability: No instability detected Muscle Tone/Strength: Functionally intact. No obvious neuro-muscular anomalies detected. Sensory (Neurological): Unimpaired Palpation: Complains of area being tender to palpation              Upper Extremity (UE) Exam  Side: Right upper extremity  Side: Left upper extremity  Skin & Extremity Inspection: Skin color, temperature, and hair growth are WNL. No peripheral edema or cyanosis. No masses, redness, swelling, asymmetry, or associated skin lesions. No  contractures.  Skin & Extremity Inspection: Skin color, temperature, and hair growth are WNL. No peripheral edema or cyanosis. No masses, redness, swelling, asymmetry, or associated skin lesions. No contractures.  Functional ROM: Unrestricted ROM          Functional ROM: Unrestricted ROM          Muscle Tone/Strength: Functionally intact. No obvious neuro-muscular anomalies detected.  Muscle Tone/Strength: Functionally intact. No obvious neuro-muscular anomalies detected.  Sensory (Neurological): Unimpaired          Sensory (Neurological): Unimpaired          Palpation: No palpable anomalies              Palpation: No palpable anomalies              Specialized Test(s): Deferred         Specialized Test(s): Deferred          Thoracic Spine Area Exam  Skin & Axial Inspection: No masses, redness, or swelling Alignment: Symmetrical Functional ROM: Decreased ROM Stability: No instability detected Muscle Tone/Strength: Functionally intact. No obvious neuro-muscular anomalies detected. Sensory (Neurological): Musculoskeletal pain pattern Muscle strength & Tone: Complains of area being tender to palpation  Lumbar Spine Area Exam  Skin & Axial Inspection: No masses, redness, or swelling Alignment: Symmetrical Functional ROM: Improved after treatment      Stability: No instability detected Muscle Tone/Strength: Functionally intact. No obvious neuro-muscular anomalies detected. Sensory (Neurological): Unimpaired Palpation: Complains of area being tender to palpation       Provocative Tests: Lumbar Hyperextension and rotation test: Improved after treatment       Lumbar Lateral bending test: evaluation deferred today       Patrick's Maneuver: evaluation deferred today                    Gait & Posture Assessment  Ambulation: Unassisted Gait: Relatively normal for age and body habitus Posture: WNL   Lower Extremity Exam    Side: Right lower extremity  Side: Left lower extremity  Skin &  Extremity Inspection: Skin color, temperature, and hair growth are WNL. No peripheral edema or cyanosis. No masses, redness, swelling, asymmetry, or associated skin lesions. No contractures.  Skin & Extremity Inspection: Skin color, temperature, and hair growth are WNL. No peripheral edema or cyanosis. No masses, redness, swelling, asymmetry, or associated skin lesions. No contractures.  Functional ROM: Unrestricted ROM          Functional ROM: Unrestricted ROM          Muscle Tone/Strength: Functionally intact. No obvious neuro-muscular anomalies detected.  Muscle Tone/Strength: Functionally intact. No obvious neuro-muscular anomalies detected.  Sensory (Neurological): Unimpaired  Sensory (Neurological): Unimpaired  Palpation: No palpable anomalies  Palpation: No palpable anomalies   Assessment  Primary Diagnosis & Pertinent Problem List: The primary encounter diagnosis was Spondylosis of lumbar region without myelopathy or radiculopathy. Diagnoses of Lumbar facet arthropathy, Chronic bilateral low back pain without sciatica, Degenerative disc disease, lumbar, Arthritis, lumbar spine, and S/P cervical spinal fusion were also pertinent to this visit.  Status Diagnosis  Responding Responding Stable 1. Spondylosis of lumbar region without myelopathy or radiculopathy   2. Lumbar facet arthropathy   3. Chronic bilateral low back pain without sciatica   4.  Degenerative disc disease, lumbar   5. Arthritis, lumbar spine   6. S/P cervical spinal fusion      Patient is a very pleasant 47 year old male who presents for follow-up status post left L3-L5 radiofrequency ablation.  Patient has had both his left and right sides completed which includes a L3/4, L4/5, L5/S1 facet medial branches bilaterally.  He states that his deeper back pain has improved.  He wakes up in the morning without as much pain and is able to stand for slightly longer without experiencing as much pain in his lower back and buttocks.   All the patient has seen improvement in his lower back symptoms, he endorses pain in his mid thoracic spine most pronounced between his inferior scapular border.  He describes a sensation is very tight which could be secondary to myofascial pain syndrome.  Patient does have a history of a ACDF and states that cervical flexion causes pain in the back of his calves.  He does not endorse any upper extremity symptoms which make cervical radiculopathy less likely.  I will obtain x-rays of the patient's cervical spine, thoracic spine, lumbar spine to further workup his pain symptoms in these anatomical regions.  Patient also finds amitriptyline effective and I will provide a refill for 50 mg nightly.  I have instructed the patient to continue with physical therapy exercises at home.  I did have an extensive discussion with the patient about how chronic pain can affect mental health and did provide therapeutic counseling to help with pain coping and management.  Plan: -X-rays as below -Amitriptyline 50 mg nightly -Tizanidine as below -Consideration of trigger point injections for myofascial pain syndrome   Plan of Care  Pharmacotherapy (Medications Ordered): Meds ordered this encounter  Medications  . amitriptyline (ELAVIL) 50 MG tablet    Sig: Take 1 tablet (50 mg total) by mouth at bedtime.    Dispense:  30 tablet    Refill:  3  . tiZANidine (ZANAFLEX) 4 MG tablet    Sig: Take 1 tablet (4 mg total) by mouth every 8 (eight) hours as needed for muscle spasms.    Dispense:  90 tablet    Refill:  1   Lab-work, procedure(s), and/or referral(s): Orders Placed This Encounter  Procedures  . DG Cervical Spine Complete  . DG Cervical Spine With Flex & Extend  . DG Thoracic Spine 2 View  . DG Lumbar Spine Complete W/Bend    Provider-requested follow-up: Return in about 4 weeks (around 01/30/2018) for Medication Management, After Imaging. Time Note: Greater than 50% of the 25 minute(s) of face-to-face  time spent with Mr. Wheatley, was spent in counseling/coordination of care regarding: the appropriate use of the pain scale, the treatment plan, treatment alternatives, the results, interpretation and significance of  his recent diagnostic interventional treatment(s), realistic expectations and the goals of pain management (increased in functionality).   Future Appointments  Date Time Provider [place]  01/30/2018  8:30 AM Gillis Santa, MD [facility] None  08/08/2018 11:00 AM Lada, Satira Anis, MD [facility]    Primary Care Physician: Arnetha Courser, MD Location: [facility] Outpatient Pain Management Facility Note by: Gillis Santa, M.D Date: 01/02/2018; Time: 9:29 AM  Patient Instructions  1. Xrays of thoracic, lumbar and cervical spine 2. Refill on Amitriptyline  Trigger Point Injection Trigger points are areas where you have pain. A trigger point injection is a shot given in the trigger point to help relieve pain for a few days to a few  months. Common places for trigger points include:  The neck.  The shoulders.  The upper back.  The lower back.  A trigger point injection will not cure long-lasting (chronic) pain permanently. These injections do not always work for every person, but for some people they can help to relieve pain for a few days to a few months. Tell a health care provider about:  Any allergies you have.  All medicines you are taking, including vitamins, herbs, eye drops, creams, and over-the-counter medicines.  Any problems you or family members have had with anesthetic medicines.  Any blood disorders you have.  Any surgeries you have had.  Any medical conditions you have. What are the risks? Generally, this is a safe procedure. However, problems may occur, including:  Infection.  Bleeding.  Allergic reaction to the injected medicine.  Irritation of the skin around the injection site.  What happens before the procedure?  Ask your health care  provider about changing or stopping your regular medicines. This is especially important if you are taking diabetes medicines or blood thinners. What happens during the procedure?  Your health care provider will feel for trigger points. A marker may be used to circle the area for the injection.  The skin over the trigger point will be washed with a germ-killing (antiseptic) solution.  A thin needle is used for the shot. You may feel pain or a twitching feeling when the needle enters the trigger point.  A numbing solution may be injected into the trigger point. Sometimes a medicine to keep down swelling, redness, and warmth (inflammation) is also injected.  Your health care provider may move the needle around the area where the trigger point is located until the tightness and twitching goes away.  After the injection, your health care provider may put gentle pressure over the injection site.  The injection site will be covered with a bandage (dressing). The procedure may vary among health care providers and hospitals. What happens after the procedure?  The dressing can be taken off in a few hours or as told by your health care provider.  You may feel sore and stiff for 1-2 days. This information is not intended to replace advice given to you by your health care provider. Make sure you discuss any questions you have with your health care provider. Document Released: 12/01/2011 Document Revised: 08/14/2016 Document Reviewed: 06/01/2015 Elsevier Interactive Patient Education  2018 [employer].

## 2018-01-02 NOTE — Progress Notes (Signed)
Safety precautions to be maintained throughout the outpatient stay will include: orient to surroundings, keep bed in low position, maintain call bell within reach at all times, provide assistance with transfer out of bed and ambulation.  

## 2018-01-10 ENCOUNTER — Encounter: Payer: Self-pay | Admitting: Student in an Organized Health Care Education/Training Program

## 2018-01-11 ENCOUNTER — Encounter: Payer: Self-pay | Admitting: Family Medicine

## 2018-01-11 ENCOUNTER — Ambulatory Visit: Payer: 59 | Admitting: Family Medicine

## 2018-01-11 VITALS — BP 130/80 | HR 88 | Temp 98.1°F | Resp 18 | Ht 71.0 in | Wt 238.6 lb

## 2018-01-11 DIAGNOSIS — Z794 Long term (current) use of insulin: Secondary | ICD-10-CM | POA: Diagnosis not present

## 2018-01-11 DIAGNOSIS — J209 Acute bronchitis, unspecified: Secondary | ICD-10-CM | POA: Diagnosis not present

## 2018-01-11 DIAGNOSIS — E119 Type 2 diabetes mellitus without complications: Secondary | ICD-10-CM | POA: Diagnosis not present

## 2018-01-11 MED ORDER — ALBUTEROL SULFATE HFA 108 (90 BASE) MCG/ACT IN AERS: 2.0000 | INHALATION_SPRAY | Freq: Four times a day (QID) | RESPIRATORY_TRACT | 0 refills | Status: DC | PRN

## 2018-01-11 MED ORDER — AZITHROMYCIN 250 MG PO TABS: ORAL_TABLET | ORAL | 0 refills | Status: DC

## 2018-01-11 MED ORDER — PROMETHAZINE-DM 6.25-15 MG/5ML PO SYRP: 5.0000 mL | ORAL_SOLUTION | Freq: Four times a day (QID) | ORAL | 0 refills | Status: DC | PRN

## 2018-01-11 NOTE — Patient Instructions (Addendum)
Continue to monitor your BG daily - if your FASTING BG goes above 180, please call our clinic for further instruction.  Cool Mist Vaporizer A cool mist vaporizer is a device that releases a cool mist into the air. If you have a cough or a cold, using a vaporizer may help relieve your symptoms. The mist adds moisture to the air, which may help thin your mucus and make it less sticky. When your mucus is thin and less sticky, it easier for you to breathe and to cough up secretions. Do not use a vaporizer if you are allergic to mold. Follow these instructions at home:  Follow the instructions that come with the vaporizer.  Do not use anything other than distilled water in the vaporizer.  Do not run the vaporizer all of the time. Doing that can cause mold or bacteria to grow in the vaporizer.  Clean the vaporizer after each time that you use it.  Clean and dry the vaporizer well before storing it.  Stop using the vaporizer if your breathing symptoms get worse. This information is not intended to replace advice given to you by your health care provider. Make sure you discuss any questions you have with your health care provider. Document Released: 09/08/2004 Document Revised: 07/01/2016 Document Reviewed: 03/12/2016 Elsevier Interactive Patient Education  2018 [employer].   Acute Bronchitis, Adult Acute bronchitis is when air tubes (bronchi) in the lungs suddenly get swollen. The condition can make it hard to breathe. It can also cause these symptoms:  A cough.  Coughing up clear, yellow, or green mucus.  Wheezing.  Chest congestion.  Shortness of breath.  A fever.  Body aches.  Chills.  A sore throat.  Follow these instructions at home: Medicines  Take over-the-counter and prescription medicines only as told by your doctor.  If you were prescribed an antibiotic medicine, take it as told by your doctor. Do not stop taking the antibiotic even if you start to feel  better. General instructions  Rest.  Drink enough fluids to keep your pee (urine) clear or pale yellow.  Avoid smoking and secondhand smoke. If you smoke and you need help quitting, ask your doctor. Quitting will help your lungs heal faster.  Use an inhaler, cool mist vaporizer, or humidifier as told by your doctor.  Keep all follow-up visits as told by your doctor. This is important. How is this prevented? To lower your risk of getting this condition again:  Wash your hands often with soap and water. If you cannot use soap and water, use hand sanitizer.  Avoid contact with people who have cold symptoms.  Try not to touch your hands to your mouth, nose, or eyes.  Make sure to get the flu shot every year.  Contact a doctor if:  Your symptoms do not get better in 2 weeks. Get help right away if:  You cough up blood.  You have chest pain.  You have very bad shortness of breath.  You become dehydrated.  You faint (pass out) or keep feeling like you are going to pass out.  You keep throwing up (vomiting).  You have a very bad headache.  Your fever or chills gets worse. This information is not intended to replace advice given to you by your health care provider. Make sure you discuss any questions you have with your health care provider. Document Released: 05/30/2008 Document Revised: 07/20/2016 Document Reviewed: 06/01/2016 Elsevier Interactive Patient Education  [employer].

## 2018-01-11 NOTE — Progress Notes (Addendum)
Name: John Hartman   MRN: [id number]    DOB: 29-Jul-1971   Date:01/11/2018       Progress Note  Subjective  Chief Complaint  Chief Complaint  Patient presents with  . URI    cough, congested, headache on and off for 4 weeks    HPI  December 22-23 2018 started with hoarse voice and cough and felt sick.  Most of his symptoms went away except the cough, now over the last few days he started to have worsening cough, chest tightness, nasal congestion, headaches.  Denies shortness of breath, ear pain/pressure, abdominal pain, NVD, body aches, fevers/chills, or rash.  Takes Zyrtec; has been taking ibuprofen for headaches.  Has DM: BG's have been running about 150's over the last few days which is higher than normal for him. Taking Tresiba (20 units) and Metformin and is compliant.  Patient Active Problem List   Diagnosis Date Noted  . Elevated hemoglobin ([place]) 08/11/2017  . Chronic lower back pain 08/04/2017  . Degenerative disc disease, lumbar 08/04/2017  . 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 Calcification of abdominal aorta ([facility]) 08/04/2017  . Fatty liver 03/31/2017  . Elevated liver enzymes 02/11/2017  . Serum total bilirubin elevated 02/11/2017  . Hyperlipidemia LDL goal <70 01/04/2017  . Elevated serum glutamic pyruvic transaminase (SGPT) level 01/04/2017  . Angioedema   . Encounter for medication monitoring 08/25/2016  . Coronary artery disease 08/25/2016  . Diabetes mellitus ([place])   . Chest pain 08/08/2016  . Preventative health care 08/02/2016  . Ketonuria 07/12/2016  . Glucosuria 07/12/2016  . 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 Palpitations 08/12/2015  . Family history of malignant neoplasm of gastrointestinal tract   . Benign neoplasm of rectosigmoid junction   . Essential hypertension 05/28/2015  . Gout 05/28/2015  . IBS (irritable bowel syndrome) 05/28/2015    Social History   Tobacco Use  . Smoking status: Former Smoker    Packs/day: 1.00    Years: 10.00    Pack  years: 10.00    Types: Cigarettes    Last attempt to quit: 04/15/2015    Years since quitting: 2.7  . Smokeless tobacco: Never Used  Substance Use Topics  . Alcohol use: No    Alcohol/week: 0.0 oz     Current Outpatient Medications:  .  ACCU-CHEK SOFTCLIX LANCETS lancets, USE AS DIRECTED, Disp: 100 each, Rfl: 0 .  allopurinol (ZYLOPRIM) 100 MG tablet, Take 100 mg by mouth every morning. , Disp: , Rfl:  .  amitriptyline (ELAVIL) 50 MG tablet, Take 1 tablet (50 mg total) by mouth at bedtime., Disp: 30 tablet, Rfl: 3 .  atenolol (TENORMIN) 50 MG tablet, Take 1 tablet (50 mg total) by mouth daily., Disp: 90 tablet, Rfl: 3 .  belladonna-PHENObarbital (DONNATAL) 16.2 MG/5ML ELIX, Take 5 mLs (16.2 mg total) by mouth 4 (four) times daily as needed for cramping or pain. (Patient taking differently: Take 5 mLs by mouth 4 (four) times daily as needed for cramping or pain. ), Disp: 600 mL, Rfl: 1 .  cetirizine (ZYRTEC) 10 MG tablet, Take 10 mg by mouth at bedtime. , Disp: , Rfl:  .  colchicine 0.6 MG tablet, Take 0.6 mg by mouth daily as needed., Disp: , Rfl:  .  diphenhydrAMINE (BENADRYL) 25 mg capsule, Take 25 mg by mouth at bedtime. , Disp: , Rfl:  .  EPINEPHrine (EPIPEN 2-PAK) 0.3 mg/0.3 mL IJ SOAJ injection, Inject 0.3 mg into the muscle once., Disp: , Rfl:  .  glucose  blood (ACCU-CHEK ACTIVE STRIPS) test strip, Use as instructed, Disp: 100 each, Rfl: 12 .  glucose blood (ACCU-CHEK AVIVA PLUS) test strip, Check FSBS three to four times per day, Disp: 100 each, Rfl: 1 .  Insulin Degludec (TRESIBA FLEXTOUCH) 100 UNIT/ML SOPN, Inject 20 Units into the skin at bedtime. , Disp: 1 pen, Rfl: 0 .  Insulin Pen Needle (B-D UF III MINI PEN NEEDLES) 31G X 5 MM MISC, Once a day with insulin, Disp: 50 each, Rfl: 3 .  Melatonin 5 MG TABS, Take 5 mg by mouth at bedtime. , Disp: , Rfl:  .  naproxen sodium (ANAPROX) 220 MG tablet, Take 1-2 tablets (220-440 mg total) by mouth 2 (two) times daily with a meal., Disp: ,  Rfl:  .  ranitidine (ZANTAC) 150 MG capsule, Take 150 mg by mouth at bedtime. , Disp: , Rfl:  .  tiZANidine (ZANAFLEX) 4 MG tablet, Take 1 tablet (4 mg total) by mouth every 8 (eight) hours as needed for muscle spasms., Disp: 90 tablet, Rfl: 1 .  albuterol (PROVENTIL HFA;VENTOLIN HFA) 108 (90 Base) MCG/ACT inhaler, Inhale 2 puffs into the lungs every 6 (six) hours as needed for wheezing or shortness of breath., Disp: 1 Inhaler, Rfl: 0 .  azithromycin (ZITHROMAX) 250 MG tablet, Day 1: Take 2 tabs; Days 2-5: Take 1 tab daily, Disp: 6 tablet, Rfl: 0 .  metFORMIN (GLUCOPHAGE-XR) 500 MG 24 hr tablet, Take 1,500 mg by mouth every evening. Take with supper., Disp: , Rfl:  .  promethazine-dextromethorphan (PROMETHAZINE-DM) 6.25-15 MG/5ML syrup, Take 5 mLs by mouth 4 (four) times daily as needed for cough., Disp: 180 mL, Rfl: 0  Allergies  Allergen Reactions  . Lisinopril Swelling  . Ace Inhibitors Swelling    ROS  Ten systems reviewed and is negative except as mentioned in HPI  Objective  Vitals:   01/11/18 0913  BP: 130/80  Pulse: 88  Resp: 18  Temp: 98.1 F (36.7 C)  TempSrc: Oral  SpO2: 98%  Weight: 238 lb 9.6 oz (108.2 kg)  Height: 5\' 11"  (1.803 m)   Body mass index is 33.28 kg/m.  Nursing Note and Vital Signs reviewed.  Physical Exam Constitutional: Patient appears well-developed and well-nourished. Obese. No distress.  HEENT: head atraumatic, normocephalic, pupils equal and reactive to light, EOM's intact, TM's without erythema or bulging, no maxillary or frontal sinus tenderness , neck supple without lymphadenopathy, oropharynx pink and moist without exudate Cardiovascular: Normal rate, regular rhythm, S1/S2 present.  No murmur or rub heard. No BLE edema. Pulmonary/Chest: Effort normal and breath sounds clear but slightly diminished throughout.  Dry hacking, raspy cough ongoing throughout examination - irritated by deep inspiration. No respiratory distress or  retractions. Psychiatric: Patient has a normal mood and affect. behavior is normal. Judgment and thought content normal.  No results found for this or any previous visit (from the past 72 hour(s)).  Assessment & Plan  1. Bronchitis with bronchospasm - azithromycin (ZITHROMAX) 250 MG tablet; Day 1: Take 2 tabs; Days 2-5: Take 1 tab daily  Dispense: 6 tablet; Refill: 0 - promethazine-dextromethorphan (PROMETHAZINE-DM) 6.25-15 MG/5ML syrup; Take 5 mLs by mouth 4 (four) times daily as needed for cough.  Dispense: 180 mL; Refill: 0 - albuterol (PROVENTIL HFA;VENTOLIN HFA) 108 (90 Base) MCG/ACT inhaler; Inhale 2 puffs into the lungs every 6 (six) hours as needed for wheezing or shortness of breath.  Dispense: 1 Inhaler; Refill: 0 - Continue Zyrtec and other daily medications. - due to patient's course of  moderate improvement with subsequent sudden worsening, along with present T2DM, we will treat empirically with azithromycin. Advised that he needs to follow up in 4-5 days if not showing improvement, and we will consider labs and chest xray at this time.  2. Type 2 diabetes mellitus without complication, with long-term current use of insulin (HCC) Continue to monitor your BG daily - if your FASTING BG goes above 180, please call our clinic for further instruction. - Explained that having DM can increase risk of infection. Pt is very compliant with his medications, and will contact our clinic if concerns for hypo or hyper glycemia.  -Red flags and when to present for emergency care or RTC including fever >101.29F, chest pain, shortness of breath, new/worsening/un-resolving symptoms, periorbital swelling, pain with ocular movement, reviewed with patient at time of visit. Follow up and care instructions discussed and provided in AVS.

## 2018-01-30 ENCOUNTER — Ambulatory Visit
Payer: 59 | Attending: Student in an Organized Health Care Education/Training Program | Admitting: Student in an Organized Health Care Education/Training Program

## 2018-01-30 ENCOUNTER — Encounter: Payer: Self-pay | Admitting: Student in an Organized Health Care Education/Training Program

## 2018-01-30 ENCOUNTER — Other Ambulatory Visit: Payer: Self-pay

## 2018-01-30 VITALS — BP 163/112 | HR 95 | Temp 98.1°F | Resp 16 | Ht 71.0 in | Wt 230.0 lb

## 2018-01-30 DIAGNOSIS — K760 Fatty (change of) liver, not elsewhere classified: Secondary | ICD-10-CM | POA: Insufficient documentation

## 2018-01-30 DIAGNOSIS — M545 Low back pain, unspecified: Secondary | ICD-10-CM

## 2018-01-30 DIAGNOSIS — M5136 Other intervertebral disc degeneration, lumbar region: Secondary | ICD-10-CM | POA: Insufficient documentation

## 2018-01-30 DIAGNOSIS — I10 Essential (primary) hypertension: Secondary | ICD-10-CM | POA: Diagnosis not present

## 2018-01-30 DIAGNOSIS — G8929 Other chronic pain: Secondary | ICD-10-CM | POA: Diagnosis present

## 2018-01-30 DIAGNOSIS — Z87891 Personal history of nicotine dependence: Secondary | ICD-10-CM | POA: Diagnosis not present

## 2018-01-30 DIAGNOSIS — Z981 Arthrodesis status: Secondary | ICD-10-CM | POA: Diagnosis not present

## 2018-01-30 DIAGNOSIS — Z79899 Other long term (current) drug therapy: Secondary | ICD-10-CM | POA: Insufficient documentation

## 2018-01-30 DIAGNOSIS — M4686 Other specified inflammatory spondylopathies, lumbar region: Secondary | ICD-10-CM | POA: Diagnosis not present

## 2018-01-30 DIAGNOSIS — M109 Gout, unspecified: Secondary | ICD-10-CM | POA: Diagnosis not present

## 2018-01-30 DIAGNOSIS — M1288 Other specific arthropathies, not elsewhere classified, other specified site: Secondary | ICD-10-CM | POA: Diagnosis not present

## 2018-01-30 DIAGNOSIS — M47816 Spondylosis without myelopathy or radiculopathy, lumbar region: Secondary | ICD-10-CM

## 2018-01-30 DIAGNOSIS — K589 Irritable bowel syndrome without diarrhea: Secondary | ICD-10-CM | POA: Diagnosis not present

## 2018-01-30 DIAGNOSIS — E785 Hyperlipidemia, unspecified: Secondary | ICD-10-CM | POA: Insufficient documentation

## 2018-01-30 DIAGNOSIS — I2510 Atherosclerotic heart disease of native coronary artery without angina pectoris: Secondary | ICD-10-CM | POA: Insufficient documentation

## 2018-01-30 DIAGNOSIS — Z794 Long term (current) use of insulin: Secondary | ICD-10-CM | POA: Insufficient documentation

## 2018-01-30 DIAGNOSIS — Z888 Allergy status to other drugs, medicaments and biological substances status: Secondary | ICD-10-CM | POA: Diagnosis not present

## 2018-01-30 DIAGNOSIS — E119 Type 2 diabetes mellitus without complications: Secondary | ICD-10-CM

## 2018-01-30 DIAGNOSIS — Z5181 Encounter for therapeutic drug level monitoring: Secondary | ICD-10-CM | POA: Diagnosis not present

## 2018-01-30 DIAGNOSIS — M542 Cervicalgia: Secondary | ICD-10-CM | POA: Insufficient documentation

## 2018-01-30 DIAGNOSIS — K219 Gastro-esophageal reflux disease without esophagitis: Secondary | ICD-10-CM | POA: Insufficient documentation

## 2018-01-30 MED ORDER — DICLOFENAC SODIUM 75 MG PO TBEC: 75.0000 mg | DELAYED_RELEASE_TABLET | Freq: Two times a day (BID) | ORAL | 0 refills | Status: DC

## 2018-01-30 NOTE — Progress Notes (Signed)
Patient's Name: John Hartman  MRN: [id number]  Referring Provider: Arnetha Courser, MD  DOB: 1971/05/09  PCP: Arnetha Courser, MD  DOS: 01/30/2018  Note by: Gillis Santa, MD  Service setting: Ambulatory outpatient  Specialty: Interventional Pain Management  Location: [facility] (AMB) Pain Management Facility    Patient type: Established   Primary Reason(s) for Visit: Encounter for prescription drug management. (Level of risk: moderate)  CC: Back Pain (lower) and Neck Pain  HPI  John Hartman is a 47 y.o. year old, male patient, who comes today for a medication management evaluation. He has Essential hypertension; Gout; IBS (irritable bowel syndrome); Family history of malignant neoplasm of gastrointestinal tract; Benign neoplasm of rectosigmoid junction; Palpitations; Decongestant abuse; Ketonuria; Glucosuria; Preventative health care; Chest pain; Diabetes mellitus ([place]); Encounter for medication monitoring; Coronary artery disease; Angioedema; Hyperlipidemia LDL goal <70; Elevated serum glutamic pyruvic transaminase (SGPT) level; Elevated liver enzymes; Serum total bilirubin elevated; Fatty liver; Chronic lower back pain; Degenerative disc disease, lumbar; Arthritis, lumbar spine; Calcification of abdominal aorta (HCC); and Elevated hemoglobin (HCC) on their problem list. His primarily concern today is the Back Pain (lower) and Neck Pain  Pain Assessment: Location: Lower, Mid Back Radiating: buttocks down bakc of legs to knees Onset: More than a month ago Duration: Chronic pain Quality: (Pt states when he bends hea down that he gets pain/ numbness in his feet ) Severity: 6 /10 (self-reported pain score)  Note: Reported level is inconsistent with clinical observations. Clinically the patient looks like a 2/10 A 2/10 is viewed as "Mild to Moderate" and described as noticeable and distracting. Impossible to hide from other people. More frequent flare-ups. Still possible to adapt and function close to normal. It  can be very annoying and may have occasional stronger flare-ups. With discipline, patients may get used to it and adapt.       When using our objective Pain Scale, levels between 6 and 10/10 are said to belong in an emergency room, as it progressively worsens from a 6/10, described as severely limiting, requiring emergency care not usually available at an outpatient pain management facility. At a 6/10 level, communication becomes difficult and requires great effort. Assistance to reach the emergency department may be required. Facial flushing and profuse sweating along with potentially dangerous increases in heart rate and blood pressure will be evident. Effect on ADL: hard to driving, prolong standing Timing: Constant Modifying factors: denies  John Hartman was last scheduled for an appointment on 01/02/2018 for medication management. During today's appointment we reviewed John Hartman chronic pain status, as well as his outpatient medication regimen.  Patient returns today for follow-up.  He is here to review his thoracic, lumbar, cervical spine x-rays that were obtained.  Otherwise the patient is at baseline.  He states that he has lower back pain, thoracic pain and neck pain.  He states that he has lower extremity numbness and tingling when he hyper-flexes his neck.  Patient continues tizanidine and amitriptyline as prescribed.  No opioid therapy.  The patient  reports that he does not use drugs. His body mass index is 32.08 kg/m.  Further details on both, my assessment(s), as well as the proposed treatment plan, please see below.  Laboratory Chemistry  Inflammation Markers (CRP: Acute Phase) (ESR: Chronic Phase) No results found for: CRP, ESRSEDRATE, LATICACIDVEN               Rheumatology Markers Lab Results  Component Value Date   RF <14 08/04/2017   ANA  Negative 09/21/2017   LABURIC 6.4 08/04/2017                Renal Function Markers Lab Results  Component Value Date   BUN 19  08/04/2017   CREATININE 1.20 08/04/2017   GFRAA 85 08/04/2017   GFRNONAA 73 08/04/2017                 Hepatic Function Markers Lab Results  Component Value Date   AST 90 (H) 09/21/2017   ALT 153 (H) 09/21/2017   ALBUMIN 4.9 09/21/2017   ALKPHOS 99 09/21/2017                 Electrolytes Lab Results  Component Value Date   NA 139 08/04/2017   K 4.8 08/04/2017   CL 101 08/04/2017   CALCIUM 10.0 08/04/2017   MG 2.0 08/27/2016   PHOS 1.9 (L) 08/27/2016                 Neuropathy Markers Lab Results  Component Value Date   HGBA1C  07/08/2016    UNABLE TO REPORT A1C DUE TO UNKNOWN INTERFERING FACTOR                 Bone Pathology Markers Lab Results  Component Value Date   VD25OH 30 08/04/2017                 Coagulation Parameters Lab Results  Component Value Date   INR 0.96 08/11/2016   LABPROT 12.8 08/11/2016   PLT 195 08/04/2017                 Cardiovascular Markers Lab Results  Component Value Date   TROPONINI <0.03 07/08/2016   HGB 17.2 (H) 08/04/2017   HCT 51.1 (H) 08/04/2017                 CA Markers No results found for: CEA, CA125, LABCA2               Note: Lab results reviewed.  Recent Diagnostic Imaging Results  DG Lumbar Spine Complete W/Bend CLINICAL DATA:  Chronic lumbago  EXAM: LUMBAR SPINE - COMPLETE WITH BENDING VIEWS  COMPARISON:  None.  FINDINGS: Frontal, weight-bearing neutral lateral, weight-bearing flexion lateral, weight-bearing extension lateral, spot lumbosacral lateral, and bilateral oblique views were obtained. The there are 5 non-rib-bearing lumbar type vertebral bodies. There is no fracture.  With neutral and extension lateral imaging, there is 7 mm of retrolisthesis of L1 on L2. On flexion lateral imaging, there is 4 mm of retrolisthesis of L1 on L2.  With neutral and extension lateral imaging, there is 6 mm of retrolisthesis of L2 on L3. With flexion lateral imaging, there is 4 mm of retrolisthesis of L2 on  L3. There is 3 mm of retrolisthesis of L4 on L5 with neutral lateral, flexion lateral, and extension lateral imaging.  There is moderate disc space narrowing at L1-2. There is slightly milder disc space narrowing at L2-3 and L4-5. There is facet osteoarthritic change at L4-5 and L5-S1 bilaterally. There is atherosclerotic calcification in the aorta. There is a 3 mm calcification in the left kidney.  IMPRESSION: No fracture. Areas of spondylolisthesis as summarized above with areas of change in lateral alignment between neutral lateral, flexion lateral, extension lateral positioning as noted. Areas of osteoarthritic change, most notably at L1-2 and L2-3.  There is aortic atherosclerosis.  3 mm left renal calculus noted.  Aortic Atherosclerosis (ICD10-I70.0).  Electronically Signed   By: Gwyndolyn Saxon  Jasmine December III M.D.   On: 01/02/2018 11:32 DG Thoracic Spine 2 View CLINICAL DATA:  Chronic dorsalgia  EXAM: THORACIC SPINE 3 VIEWS  COMPARISON:  None.  FINDINGS: Frontal, lateral, and swimmer's views obtained. There is no fracture or spondylolisthesis. There is mild disc space narrowing at multiple levels in the upper and lower thoracic regions. No paraspinous lesion or erosion noted.  IMPRESSION: Osteoarthritic changes at several levels in the upper and lower thoracic region. Disc space narrowing is most notable at T9-10, T10-11, T11-12, and T12-L1. No erosive change. No fracture or spondylolisthesis.  Electronically Signed   By: Lowella Grip III M.D.   On: 01/02/2018 11:17 DG Cervical Spine With Flex & Extend CLINICAL DATA:  Chronic cervicalgia  EXAM: CERVICAL SPINE COMPLETE WITH FLEXION AND EXTENSION VIEWS  COMPARISON:  None.  FINDINGS: Frontal, neutral lateral, flexion lateral, extension lateral, open-mouth odontoid, and bilateral oblique views obtained. There is no evident fracture. There is no appreciable spondylolisthesis on neutral lateral imaging. There is  no appreciable change in lateral alignment with flexion-extension.  The patient is status post anterior screw and plate fixation from O8-N8 with disc spacers noted at C4-5, C5-6, and C6-7. Support hardware appears intact at all levels.  There is moderate disc space narrowing at C7-T1. No other disc space narrowing is appreciable. There is calcification in the anterior ligament at C3-4. There is mild facet hypertrophy on the right at C4-5 causing mild exit foraminal narrowing. No similar changes seen elsewhere in the cervical region. No erosive change. Lung apices are clear.  There is relative lack of lordosis on the neutral lateral imaging. There is mild calcification in the left carotid artery.  IMPRESSION: Postoperative change from C4-C7 with support hardware intact. Moderate disc space narrowing C7-T1. Calcification anterior ligament at C3-4. Mild exit foraminal narrowing due to bony hypertrophy on the right at C4-5.  No fracture. No spondylolisthesis. No change in lateral alignment with flexion-extension. Patient's ability to flex and extend appears somewhat limited. Lack of lordosis on neutral lateral imaging suggests underlying muscle spasm.  Mild calcification noted in left carotid artery.  Electronically Signed   By: Lowella Grip III M.D.   On: 01/02/2018 11:15  Complexity Note: Imaging results reviewed. Results shared with Mr. [employer], using Layman's terms.                         Meds   Current Outpatient Medications:  .  ACCU-CHEK SOFTCLIX LANCETS lancets, USE AS DIRECTED, Disp: 100 each, Rfl: 0 .  allopurinol (ZYLOPRIM) 100 MG tablet, Take 100 mg by mouth every morning. , Disp: , Rfl:  .  amitriptyline (ELAVIL) 50 MG tablet, Take 1 tablet (50 mg total) by mouth at bedtime., Disp: 30 tablet, Rfl: 3 .  atenolol (TENORMIN) 50 MG tablet, Take 1 tablet (50 mg total) by mouth daily., Disp: 90 tablet, Rfl: 3 .  belladonna-PHENObarbital (DONNATAL) 16.2 MG/5ML ELIX,  Take 5 mLs (16.2 mg total) by mouth 4 (four) times daily as needed for cramping or pain. (Patient taking differently: Take 5 mLs by mouth 4 (four) times daily as needed for cramping or pain. ), Disp: 600 mL, Rfl: 1 .  cetirizine (ZYRTEC) 10 MG tablet, Take 10 mg by mouth at bedtime. , Disp: , Rfl:  .  colchicine 0.6 MG tablet, Take 0.6 mg by mouth daily as needed., Disp: , Rfl:  .  diphenhydrAMINE (BENADRYL) 25 mg capsule, Take 25 mg by mouth at bedtime. , Disp: ,  Rfl:  .  EPINEPHrine (EPIPEN 2-PAK) 0.3 mg/0.3 mL IJ SOAJ injection, Inject 0.3 mg into the muscle once., Disp: , Rfl:  .  glucose blood (ACCU-CHEK ACTIVE STRIPS) test strip, Use as instructed, Disp: 100 each, Rfl: 12 .  glucose blood (ACCU-CHEK AVIVA PLUS) test strip, Check FSBS three to four times per day, Disp: 100 each, Rfl: 1 .  Insulin Degludec (TRESIBA FLEXTOUCH) 100 UNIT/ML SOPN, Inject 20 Units into the skin at bedtime. , Disp: 1 pen, Rfl: 0 .  Insulin Pen Needle (B-D UF III MINI PEN NEEDLES) 31G X 5 MM MISC, Once a day with insulin, Disp: 50 each, Rfl: 3 .  Melatonin 5 MG TABS, Take 5 mg by mouth at bedtime. , Disp: , Rfl:  .  promethazine-dextromethorphan (PROMETHAZINE-DM) 6.25-15 MG/5ML syrup, Take 5 mLs by mouth 4 (four) times daily as needed for cough., Disp: 180 mL, Rfl: 0 .  ranitidine (ZANTAC) 150 MG capsule, Take 150 mg by mouth at bedtime. , Disp: , Rfl:  .  tiZANidine (ZANAFLEX) 4 MG tablet, Take 1 tablet (4 mg total) by mouth every 8 (eight) hours as needed for muscle spasms., Disp: 90 tablet, Rfl: 1 .  diclofenac (VOLTAREN) 75 MG EC tablet, Take 1 tablet (75 mg total) by mouth 2 (two) times daily., Disp: 60 tablet, Rfl: 0 .  metFORMIN (GLUCOPHAGE-XR) 500 MG 24 hr tablet, Take 1,500 mg by mouth every evening. Take with supper., Disp: , Rfl:   ROS  Constitutional: Denies any fever or chills Gastrointestinal: No reported hemesis, hematochezia, vomiting, or acute GI distress Musculoskeletal: Denies any acute onset joint  swelling, redness, loss of ROM, or weakness Neurological: No reported episodes of acute onset apraxia, aphasia, dysarthria, agnosia, amnesia, paralysis, loss of coordination, or loss of consciousness  Allergies  Mr. Mullens is allergic to lisinopril and ace inhibitors.  PFSH  Drug: Mr. Hymes  reports that he does not use drugs. Alcohol:  reports that he does not drink alcohol. Tobacco:  reports that he quit smoking about 2 years ago. His smoking use included cigarettes. He has a 10.00 pack-year smoking history. he has never used smokeless tobacco. Medical:  has a past medical history of Angioedema, Bronchitis, DDD (degenerative disc disease), cervical, Diabetes mellitus ([place]), Fatty liver (03/31/2017), GERD (gastroesophageal reflux disease), Gout, HTN (hypertension), Hypertension, IBS (irritable bowel syndrome), Seasonal allergies, and Spinal stenosis. Surgical: Mr. Caffrey  has a past surgical history that includes Shoulder surgery (Left, 2007); Nasal septum surgery (2004); ACDF with fusion (2007); Testicular torsion (1980s); Vasectomy; Colonoscopy with propofol (N/A, 06/26/2015); and Cardiac catheterization (Left, 08/15/2016). Family: family history includes Allergies in his son; Alzheimer's disease in his maternal grandmother; Arrhythmia in his mother; Breast cancer in his mother; Cancer in his maternal grandfather and mother; Diabetes in his father; Heart disease in his brother; Hypertension in his father; Skin cancer in his mother.  Constitutional Exam  General appearance: Well nourished, well developed, and well hydrated. In no apparent acute distress Vitals:   01/30/18 0838  BP: (!) 163/112  Pulse: 95  Resp: 16  Temp: 98.1 F (36.7 C)  SpO2: 100%  Weight: 230 lb (104.3 kg)  Height: _0  (1.803 m)   BMI Assessment: Estimated body mass index is 32.08 kg/m as calculated from the following:   Height as of this encounter: _1  (1.803 m).   Weight as of this encounter: 230 lb (104.3  kg).  BMI interpretation table: BMI level Category Range association with higher incidence of chronic pain  <  18 kg/m2 Underweight   18.5-24.9 kg/m2 Ideal body weight   25-29.9 kg/m2 Overweight Increased incidence by 20%  30-34.9 kg/m2 Obese (Class I) Increased incidence by 68%  35-39.9 kg/m2 Severe obesity (Class II) Increased incidence by 136%  >40 kg/m2 Extreme obesity (Class III) Increased incidence by 254%   BMI Readings from Last 4 Encounters:  01/30/18 32.08 kg/m  01/11/18 33.28 kg/m  01/02/18 32.78 kg/m  11/20/17 31.19 kg/m   Wt Readings from Last 4 Encounters:  01/30/18 230 lb (104.3 kg)  01/11/18 238 lb 9.6 oz (108.2 kg)  01/02/18 235 lb (106.6 kg)  11/20/17 230 lb (104.3 kg)  Psych/Mental status: Alert, oriented x 3 (person, place, & time)       Eyes: PERLA Respiratory: No evidence of acute respiratory distress  Cervical Spine Area Exam  Skin & Axial Inspection: No masses, redness, edema, swelling, or associated skin lesions Alignment: Symmetrical Functional ROM: Decreased ROM, bilaterally Stability: No instability detected Muscle Tone/Strength: Functionally intact. No obvious neuro-muscular anomalies detected. Sensory (Neurological): Unimpaired Palpation: No palpable anomalies              Upper Extremity (UE) Exam    Side: Right upper extremity  Side: Left upper extremity  Skin & Extremity Inspection: Skin color, temperature, and hair growth are WNL. No peripheral edema or cyanosis. No masses, redness, swelling, asymmetry, or associated skin lesions. No contractures.  Skin & Extremity Inspection: Skin color, temperature, and hair growth are WNL. No peripheral edema or cyanosis. No masses, redness, swelling, asymmetry, or associated skin lesions. No contractures.  Functional ROM: Unrestricted ROM          Functional ROM: Unrestricted ROM          Muscle Tone/Strength: Functionally intact. No obvious neuro-muscular anomalies detected.  Muscle Tone/Strength:  Functionally intact. No obvious neuro-muscular anomalies detected.  Sensory (Neurological): Unimpaired          Sensory (Neurological): Unimpaired          Palpation: No palpable anomalies              Palpation: No palpable anomalies              Specialized Test(s): Deferred         Specialized Test(s): Deferred          Thoracic Spine Area Exam  Skin & Axial Inspection: No masses, redness, or swelling Alignment: Symmetrical Functional ROM: Decreased ROM Stability: No instability detected Muscle Tone/Strength: Functionally intact. No obvious neuro-muscular anomalies detected. Sensory (Neurological): Articular pain pattern Muscle strength & Tone: No palpable anomalies  Lumbar Spine Area Exam  Skin & Axial Inspection: No masses, redness, or swelling Alignment: Symmetrical Functional ROM: Decreased ROM, bilaterally Stability: No instability detected Muscle Tone/Strength: Functionally intact. No obvious neuro-muscular anomalies detected. Sensory (Neurological): Articular pain pattern Palpation: Complains of area being tender to palpation       Provocative Tests: Lumbar Hyperextension and rotation test: Positive bilaterally for facet joint pain. Lumbar Lateral bending test: Positive due to pain. Patrick's Maneuver: evaluation deferred today                    Gait & Posture Assessment  Ambulation: Unassisted Gait: Relatively normal for age and body habitus Posture: WNL   Lower Extremity Exam    Side: Right lower extremity  Side: Left lower extremity  Skin & Extremity Inspection: Skin color, temperature, and hair growth are WNL. No peripheral edema or cyanosis. No masses, redness, swelling, asymmetry, or associated  skin lesions. No contractures.  Skin & Extremity Inspection: Skin color, temperature, and hair growth are WNL. No peripheral edema or cyanosis. No masses, redness, swelling, asymmetry, or associated skin lesions. No contractures.  Functional ROM: Unrestricted ROM           Functional ROM: Unrestricted ROM          Muscle Tone/Strength: Functionally intact. No obvious neuro-muscular anomalies detected.  Muscle Tone/Strength: Functionally intact. No obvious neuro-muscular anomalies detected.  Sensory (Neurological): Unimpaired  Sensory (Neurological): Unimpaired  Palpation: No palpable anomalies  Palpation: No palpable anomalies   Assessment  Primary Diagnosis & Pertinent Problem List: The primary encounter diagnosis was Spondylosis of lumbar region without myelopathy or radiculopathy. Diagnoses of Lumbar facet arthropathy, Chronic bilateral low back pain without sciatica, Degenerative disc disease, lumbar, Arthritis, lumbar spine, S/P cervical spinal fusion, and Type 2 diabetes mellitus without complication, with long-term current use of insulin ([place]) were also pertinent to this visit.  Status Diagnosis  Persistent Persistent Persistent 1. Spondylosis of lumbar region without myelopathy or radiculopathy   2. Lumbar facet arthropathy   3. Chronic bilateral low back pain without sciatica   4. Degenerative disc disease, lumbar   5. Arthritis, lumbar spine   6. S/P cervical spinal fusion   7. Type 2 diabetes mellitus without complication, with long-term current use of insulin ([employer])      General Recommendations: The pain condition that the patient suffers from is best treated with a multidisciplinary approach that involves an increase in physical activity to prevent de-conditioning and worsening of the pain cycle, as well as psychological counseling (formal and/or informal) to address the co-morbid psychological affects of pain. Treatment will often involve judicious use of pain medications and interventional procedures to decrease the pain, allowing the patient to participate in the physical activity that will ultimately produce long-lasting pain reductions. The goal of the multidisciplinary approach is to return the patient to a higher level of overall function and to  restore their ability to perform activities of daily living.   47 year old male who presents to discuss his lumbar, thoracic, cervical spine x-rays.  X-rays were discussed in detail.  We also discussed the importance of stretching and physical activity since the patient has a very stationary job.  He also has multiple areas of myofascial tenderness suggesting myofascial pain syndrome.  He is taking his tizanidine and amitriptyline as prescribed.  He finds these medications beneficial.  We also discussed the importance of dieting and healthy eating.  We discussed thoracic facet medial branch nerve blocks at this point we will hold off and try an anti-inflammatory medication: Diclofenac 75 mill grams twice daily for 1 month.  I will have the patient follow-up with me in approximately 5-6 weeks to see how he is doing.  Can consider trial of membrane stabilizer, neuropathic agent at that time such as gabapentin, Lyrica or Cymbalta.   Plan of Care  Pharmacotherapy (Medications Ordered): Meds ordered this encounter  Medications  . diclofenac (VOLTAREN) 75 MG EC tablet    Sig: Take 1 tablet (75 mg total) by mouth 2 (two) times daily.    Dispense:  60 tablet    Refill:  0   Time Note: Greater than 50% of the 15 minute(s) of face-to-face time spent with Mr. Trimarco, was spent in counseling/coordination of care regarding: Mr. Etienne primary cause of pain, the results of his recent test(s), the significance of each one oth the test(s) anomalies and it's corresponding characteristic pain pattern(s), the treatment  plan, treatment alternatives, medication side effects, realistic expectations, the goals of pain management (increased in functionality) and the need to bring and keep the BMI below 30.  Provider-requested follow-up: Return in about 6 weeks (around 03/13/2018) for Medication Management.  Future Appointments  Date Time Provider [place]  03/12/2018  8:45 AM Gillis Santa, MD [facility] None   08/08/2018 11:00 AM Lada, Satira Anis, MD [facility]    Primary Care Physician: Arnetha Courser, MD Location: [facility] Note by: Gillis Santa, M.D Date: 01/30/2018; Time: 9:24 AM  There are no Patient Instructions on file for this visit.

## 2018-01-30 NOTE — Progress Notes (Signed)
Safety precautions to be maintained throughout the outpatient stay will include: orient to surroundings, keep bed in low position, maintain call bell within reach at all times, provide assistance with transfer out of bed and ambulation.  

## 2018-02-05 ENCOUNTER — Other Ambulatory Visit: Payer: Self-pay

## 2018-02-05 DIAGNOSIS — R748 Abnormal levels of other serum enzymes: Secondary | ICD-10-CM

## 2018-02-17 LAB — HEPATIC FUNCTION PANEL
ALK PHOS: 97 IU/L (ref 39–117)
ALT: 155 IU/L — ABNORMAL HIGH (ref 0–44)
AST: 113 IU/L — AB (ref 0–40)
Albumin: 4.5 g/dL (ref 3.5–5.5)
BILIRUBIN, DIRECT: 0.24 mg/dL (ref 0.00–0.40)
Bilirubin Total: 1.1 mg/dL (ref 0.0–1.2)
Total Protein: 7.1 g/dL (ref 6.0–8.5)

## 2018-02-19 ENCOUNTER — Telehealth: Payer: Self-pay

## 2018-02-19 NOTE — Telephone Encounter (Signed)
-----   Message from Lucilla Lame, MD sent at 02/19/2018 12:45 PM EST ----- This patient's liver enzymes have not gone down and in fact have gone slightly up. His blood work has been negative so far. This patient should be set up for a liver biopsy.

## 2018-02-19 NOTE — Telephone Encounter (Signed)
Patient called back to let you know that he is also being seen at the pain clinic for back pain. He is taking Diclofenac for 30 days and was told this is hard on his liver. He is also taking Tizanidine and Amitriptyline. Please call him back regarding this

## 2018-02-19 NOTE — Telephone Encounter (Signed)
Pt has been notified of LFT results. Pt will be scheduled for a liver biopsy.

## 2018-02-22 NOTE — Telephone Encounter (Signed)
We can wait until after he is off of these medications and see if his liver enzymes are still elevated if he prefers.

## 2018-02-27 NOTE — Telephone Encounter (Signed)
Left vm letting pt know we can hold off on the liver biopsy for now until he stops the medications he is receiving from the liver clinic.

## 2018-03-12 ENCOUNTER — Encounter: Payer: Self-pay | Admitting: Student in an Organized Health Care Education/Training Program

## 2018-03-12 ENCOUNTER — Ambulatory Visit
Payer: 59 | Attending: Student in an Organized Health Care Education/Training Program | Admitting: Student in an Organized Health Care Education/Training Program

## 2018-03-12 VITALS — BP 164/117 | HR 84 | Temp 98.5°F | Resp 16 | Ht 71.0 in | Wt 235.0 lb

## 2018-03-12 DIAGNOSIS — M545 Low back pain: Secondary | ICD-10-CM | POA: Diagnosis not present

## 2018-03-12 DIAGNOSIS — M47816 Spondylosis without myelopathy or radiculopathy, lumbar region: Secondary | ICD-10-CM | POA: Diagnosis not present

## 2018-03-12 DIAGNOSIS — Z9889 Other specified postprocedural states: Secondary | ICD-10-CM | POA: Diagnosis not present

## 2018-03-12 DIAGNOSIS — Z79899 Other long term (current) drug therapy: Secondary | ICD-10-CM | POA: Insufficient documentation

## 2018-03-12 DIAGNOSIS — Z794 Long term (current) use of insulin: Secondary | ICD-10-CM | POA: Diagnosis not present

## 2018-03-12 DIAGNOSIS — Z87891 Personal history of nicotine dependence: Secondary | ICD-10-CM | POA: Insufficient documentation

## 2018-03-12 DIAGNOSIS — G8929 Other chronic pain: Secondary | ICD-10-CM | POA: Insufficient documentation

## 2018-03-12 DIAGNOSIS — M5136 Other intervertebral disc degeneration, lumbar region: Secondary | ICD-10-CM | POA: Diagnosis not present

## 2018-03-12 DIAGNOSIS — Z888 Allergy status to other drugs, medicaments and biological substances status: Secondary | ICD-10-CM | POA: Insufficient documentation

## 2018-03-12 DIAGNOSIS — Z981 Arthrodesis status: Secondary | ICD-10-CM | POA: Insufficient documentation

## 2018-03-12 MED ORDER — TIZANIDINE HCL 4 MG PO TABS: 4.0000 mg | ORAL_TABLET | Freq: Three times a day (TID) | ORAL | 5 refills | Status: DC | PRN

## 2018-03-12 MED ORDER — DICLOFENAC SODIUM 75 MG PO TBEC: 75.0000 mg | DELAYED_RELEASE_TABLET | Freq: Two times a day (BID) | ORAL | 5 refills | Status: DC

## 2018-03-12 MED ORDER — AMITRIPTYLINE HCL 25 MG PO TABS: 25.0000 mg | ORAL_TABLET | Freq: Every day | ORAL | 5 refills | Status: DC

## 2018-03-12 NOTE — Progress Notes (Signed)
Safety precautions to be maintained throughout the outpatient stay will include: orient to surroundings, keep bed in low position, maintain call bell within reach at all times, provide assistance with transfer out of bed and ambulation.  

## 2018-03-12 NOTE — Progress Notes (Signed)
Patient's Name: Jhonnie Aliano  MRN: [id number]  Referring Provider: Arnetha Courser, MD  DOB: January 18, 1971  PCP: Arnetha Courser, MD  DOS: 03/12/2018  Note by: Gillis Santa, MD  Service setting: Ambulatory outpatient  Specialty: Interventional Pain Management  Location: [facility] (AMB) Pain Management Facility    Patient type: Established   Primary Reason(s) for Visit: Encounter for prescription drug management. (Level of risk: moderate)  CC: Back Pain (entire back)  HPI  Mr. Hull is a 47 y.o. year old, male patient, who comes today for a medication management evaluation. He has Essential hypertension; Gout; IBS (irritable bowel syndrome); Family history of malignant neoplasm of gastrointestinal tract; Benign neoplasm of rectosigmoid junction; Palpitations; Decongestant abuse; Ketonuria; Glucosuria; Preventative health care; Chest pain; Diabetes mellitus ([place]); Encounter for medication monitoring; Coronary artery disease; Angioedema; Hyperlipidemia LDL goal <70; Elevated serum glutamic pyruvic transaminase (SGPT) level; Elevated liver enzymes; Serum total bilirubin elevated; Fatty liver; Chronic lower back pain; Degenerative disc disease, lumbar; Arthritis, lumbar spine; Calcification of abdominal aorta (HCC); and Elevated hemoglobin (HCC) on their problem list. His primarily concern today is the Back Pain (entire back)  Pain Assessment: Location: Lower, Upper, Mid, Left, Right Back Radiating: na Onset: More than a month ago Duration: Chronic pain Quality: Discomfort, Pressure Severity: 3 /10 (self-reported pain score)  Note: Reported level is compatible with observation.                         When using our objective Pain Scale, levels between 6 and 10/10 are said to belong in an emergency room, as it progressively worsens from a 6/10, described as severely limiting, requiring emergency care not usually available at an outpatient pain management facility. At a 6/10 level, communication becomes difficult  and requires great effort. Assistance to reach the emergency department may be required. Facial flushing and profuse sweating along with potentially dangerous increases in heart rate and blood pressure will be evident. Effect on ADL: medication allowed him to do much more than normal.  Timing: Constant Modifying factors: baclofen worked really well, states that he no longer felt as if he had back problems while taking   Mr. Lupe was last scheduled for an appointment on 01/30/2018 for medication management. During today's appointment we reviewed Mr. Beichner chronic pain status, as well as his outpatient medication regimen.  Patient returns today for medication refill.  He finds significant benefit in regards to his back pain with diclofenac 75 mg twice daily.  He does not endorse any side effects.  Patient did have a hepatic panel completed with his primary care physician which showed elevated AST ALT and we will continue to monitor.  Patient states that he has gained his life back.  He endorses improvement in his functional status.  He states that he is able to go for a walk and perform chores around his house that he was not able to do previously including cutting the grass, cleaning up, laying on the ground.  He is very pleased with his current medication regimen.   The patient  reports that he does not use drugs. His body mass index is 32.78 kg/m.  Further details on both, my assessment(s), as well as the proposed treatment plan, please see below.  Controlled Substance Pharmacotherapy Assessment REMS (Risk Evaluation and Mitigation Strategy)   Monitoring: [place] PMP: Online review of the past 39-monthperiod conducted. Compliant with practice rules and regulations  Risk of substance use disorder (SUD): Low Opioid  Risk Tool - 01/30/18 0847      Family History of Substance Abuse   Alcohol  Negative    Illegal Drugs  Negative    Rx Drugs  Negative      Personal History of Substance Abuse    Alcohol  Negative    Illegal Drugs  Negative    Rx Drugs  Negative      Total Score   Opioid Risk Tool Scoring  0    Opioid Risk Interpretation  Low Risk        Laboratory Chemistry  Inflammation Markers (CRP: Acute Phase) (ESR: Chronic Phase) No results found for: CRP, ESRSEDRATE, LATICACIDVEN                       Rheumatology Markers Lab Results  Component Value Date   RF <14 08/04/2017   ANA Negative 09/21/2017   LABURIC 6.4 08/04/2017                Renal Function Markers Lab Results  Component Value Date   BUN 19 08/04/2017   CREATININE 1.20 08/04/2017   GFRAA 85 08/04/2017   GFRNONAA 73 08/04/2017                 Hepatic Function Markers Lab Results  Component Value Date   AST 113 (H) 02/16/2018   ALT 155 (H) 02/16/2018   ALBUMIN 4.5 02/16/2018   ALKPHOS 97 02/16/2018                 Electrolytes Lab Results  Component Value Date   NA 139 08/04/2017   K 4.8 08/04/2017   CL 101 08/04/2017   CALCIUM 10.0 08/04/2017   MG 2.0 08/27/2016   PHOS 1.9 (L) 08/27/2016                        Neuropathy Markers Lab Results  Component Value Date   HGBA1C  07/08/2016    UNABLE TO REPORT A1C DUE TO UNKNOWN INTERFERING FACTOR                 Bone Pathology Markers Lab Results  Component Value Date   VD25OH 30 08/04/2017                         Coagulation Parameters Lab Results  Component Value Date   INR 0.96 08/11/2016   LABPROT 12.8 08/11/2016   PLT 195 08/04/2017                 Cardiovascular Markers Lab Results  Component Value Date   TROPONINI <0.03 07/08/2016   HGB 17.2 (H) 08/04/2017   HCT 51.1 (H) 08/04/2017                 CA Markers No results found for: CEA, CA125, LABCA2               Note: Lab results reviewed.  Recent Diagnostic Imaging Results  DG Lumbar Spine Complete W/Bend CLINICAL DATA:  Chronic lumbago  EXAM: LUMBAR SPINE - COMPLETE WITH BENDING VIEWS  COMPARISON:  None.  FINDINGS: Frontal, weight-bearing  neutral lateral, weight-bearing flexion lateral, weight-bearing extension lateral, spot lumbosacral lateral, and bilateral oblique views were obtained. The there are 5 non-rib-bearing lumbar type vertebral bodies. There is no fracture.  With neutral and extension lateral imaging, there is 7 mm of retrolisthesis of L1 on L2. On flexion lateral imaging, there is 4  mm of retrolisthesis of L1 on L2.  With neutral and extension lateral imaging, there is 6 mm of retrolisthesis of L2 on L3. With flexion lateral imaging, there is 4 mm of retrolisthesis of L2 on L3. There is 3 mm of retrolisthesis of L4 on L5 with neutral lateral, flexion lateral, and extension lateral imaging.  There is moderate disc space narrowing at L1-2. There is slightly milder disc space narrowing at L2-3 and L4-5. There is facet osteoarthritic change at L4-5 and L5-S1 bilaterally. There is atherosclerotic calcification in the aorta. There is a 3 mm calcification in the left kidney.  IMPRESSION: No fracture. Areas of spondylolisthesis as summarized above with areas of change in lateral alignment between neutral lateral, flexion lateral, extension lateral positioning as noted. Areas of osteoarthritic change, most notably at L1-2 and L2-3.  There is aortic atherosclerosis.  3 mm left renal calculus noted.  Aortic Atherosclerosis (ICD10-I70.0).  Electronically Signed   By: Lowella Grip III M.D.   On: 01/02/2018 11:32 DG Thoracic Spine 2 View CLINICAL DATA:  Chronic dorsalgia  EXAM: THORACIC SPINE 3 VIEWS  COMPARISON:  None.  FINDINGS: Frontal, lateral, and swimmer's views obtained. There is no fracture or spondylolisthesis. There is mild disc space narrowing at multiple levels in the upper and lower thoracic regions. No paraspinous lesion or erosion noted.  IMPRESSION: Osteoarthritic changes at several levels in the upper and lower thoracic region. Disc space narrowing is most notable at  T9-10, T10-11, T11-12, and T12-L1. No erosive change. No fracture or spondylolisthesis.  Electronically Signed   By: Lowella Grip III M.D.   On: 01/02/2018 11:17 DG Cervical Spine With Flex & Extend CLINICAL DATA:  Chronic cervicalgia  EXAM: CERVICAL SPINE COMPLETE WITH FLEXION AND EXTENSION VIEWS  COMPARISON:  None.  FINDINGS: Frontal, neutral lateral, flexion lateral, extension lateral, open-mouth odontoid, and bilateral oblique views obtained. There is no evident fracture. There is no appreciable spondylolisthesis on neutral lateral imaging. There is no appreciable change in lateral alignment with flexion-extension.  The patient is status post anterior screw and plate fixation from Z2-C8 with disc spacers noted at C4-5, C5-6, and C6-7. Support hardware appears intact at all levels.  There is moderate disc space narrowing at C7-T1. No other disc space narrowing is appreciable. There is calcification in the anterior ligament at C3-4. There is mild facet hypertrophy on the right at C4-5 causing mild exit foraminal narrowing. No similar changes seen elsewhere in the cervical region. No erosive change. Lung apices are clear.  There is relative lack of lordosis on the neutral lateral imaging. There is mild calcification in the left carotid artery.  IMPRESSION: Postoperative change from C4-C7 with support hardware intact. Moderate disc space narrowing C7-T1. Calcification anterior ligament at C3-4. Mild exit foraminal narrowing due to bony hypertrophy on the right at C4-5.  No fracture. No spondylolisthesis. No change in lateral alignment with flexion-extension. Patient's ability to flex and extend appears somewhat limited. Lack of lordosis on neutral lateral imaging suggests underlying muscle spasm.  Mild calcification noted in left carotid artery.  Electronically Signed   By: Lowella Grip III M.D.   On: 01/02/2018 11:15  Complexity Note: Imaging results  reviewed. Results shared with Mr. Kaus, using Layman's terms.                         Meds   Current Outpatient Medications:  .  ACCU-CHEK SOFTCLIX LANCETS lancets, USE AS DIRECTED, Disp: 100 each, Rfl: 0 .  allopurinol (ZYLOPRIM) 100 MG tablet, Take 100 mg by mouth every morning. , Disp: , Rfl:  .  atenolol (TENORMIN) 50 MG tablet, Take 1 tablet (50 mg total) by mouth daily., Disp: 90 tablet, Rfl: 3 .  belladonna-PHENObarbital (DONNATAL) 16.2 MG/5ML ELIX, Take 5 mLs (16.2 mg total) by mouth 4 (four) times daily as needed for cramping or pain. (Patient taking differently: Take 5 mLs by mouth 4 (four) times daily as needed for cramping or pain. ), Disp: 600 mL, Rfl: 1 .  cetirizine (ZYRTEC) 10 MG tablet, Take 10 mg by mouth at bedtime. , Disp: , Rfl:  .  colchicine 0.6 MG tablet, Take 0.6 mg by mouth daily as needed., Disp: , Rfl:  .  diclofenac (VOLTAREN) 75 MG EC tablet, Take 1 tablet (75 mg total) by mouth 2 (two) times daily., Disp: 60 tablet, Rfl: 5 .  diphenhydrAMINE (BENADRYL) 25 mg capsule, Take 25 mg by mouth at bedtime. , Disp: , Rfl:  .  EPINEPHrine (EPIPEN 2-PAK) 0.3 mg/0.3 mL IJ SOAJ injection, Inject 0.3 mg into the muscle once., Disp: , Rfl:  .  glucose blood (ACCU-CHEK ACTIVE STRIPS) test strip, Use as instructed, Disp: 100 each, Rfl: 12 .  glucose blood (ACCU-CHEK AVIVA PLUS) test strip, Check FSBS three to four times per day, Disp: 100 each, Rfl: 1 .  Insulin Degludec (TRESIBA FLEXTOUCH) 100 UNIT/ML SOPN, Inject 20 Units into the skin at bedtime. , Disp: 1 pen, Rfl: 0 .  Insulin Pen Needle (B-D UF III MINI PEN NEEDLES) 31G X 5 MM MISC, Once a day with insulin, Disp: 50 each, Rfl: 3 .  Melatonin 5 MG TABS, Take 5 mg by mouth at bedtime. , Disp: , Rfl:  .  ranitidine (ZANTAC) 150 MG capsule, Take 150 mg by mouth at bedtime. , Disp: , Rfl:  .  tiZANidine (ZANAFLEX) 4 MG tablet, Take 1 tablet (4 mg total) by mouth every 8 (eight) hours as needed for muscle spasms., Disp: 90 tablet,  Rfl: 5 .  amitriptyline (ELAVIL) 25 MG tablet, Take 1 tablet (25 mg total) by mouth at bedtime., Disp: 30 tablet, Rfl: 5 .  metFORMIN (GLUCOPHAGE-XR) 500 MG 24 hr tablet, Take 1,500 mg by mouth every evening. Take with supper., Disp: , Rfl:  .  metFORMIN (GLUCOPHAGE-XR) 500 MG 24 hr tablet, Take 3 tablets by mouth daily., Disp: , Rfl: 3  ROS  Constitutional: Denies any fever or chills Gastrointestinal: No reported hemesis, hematochezia, vomiting, or acute GI distress Musculoskeletal: Denies any acute onset joint swelling, redness, loss of ROM, or weakness Neurological: No reported episodes of acute onset apraxia, aphasia, dysarthria, agnosia, amnesia, paralysis, loss of coordination, or loss of consciousness  Allergies  Mr. Hedgepath is allergic to lisinopril and ace inhibitors.  PFSH  Drug: Mr. Kromer  reports that he does not use drugs. Alcohol:  reports that he does not drink alcohol. Tobacco:  reports that he quit smoking about 2 years ago. His smoking use included cigarettes. He has a 10.00 pack-year smoking history. he has never used smokeless tobacco. Medical:  has a past medical history of Angioedema, Bronchitis, DDD (degenerative disc disease), cervical, Diabetes mellitus ([place]), Fatty liver (03/31/2017), GERD (gastroesophageal reflux disease), Gout, HTN (hypertension), Hypertension, IBS (irritable bowel syndrome), Seasonal allergies, and Spinal stenosis. Surgical: Mr. Echeverri  has a past surgical history that includes Shoulder surgery (Left, 2007); Nasal septum surgery (2004); ACDF with fusion (2007); Testicular torsion (1980s); Vasectomy; Colonoscopy with propofol (N/A, 06/26/2015); and Cardiac catheterization (Left, 08/15/2016).  Family: family history includes Allergies in his son; Alzheimer's disease in his maternal grandmother; Arrhythmia in his mother; Breast cancer in his mother; Cancer in his maternal grandfather and mother; Diabetes in his father; Heart disease in his brother; Hypertension  in his father; Skin cancer in his mother.  Constitutional Exam  General appearance: Well nourished, well developed, and well hydrated. In no apparent acute distress Vitals:   03/12/18 0836  BP: (!) 164/117  Pulse: 84  Resp: 16  Temp: 98.5 F (36.9 C)  TempSrc: Oral  SpO2: 99%  Weight: 235 lb (106.6 kg)  Height: _0  (1.803 m)   BMI Assessment: Estimated body mass index is 32.78 kg/m as calculated from the following:   Height as of this encounter: _1  (1.803 m).   Weight as of this encounter: 235 lb (106.6 kg).  BMI interpretation table: BMI level Category Range association with higher incidence of chronic pain  <18 kg/m2 Underweight   18.5-24.9 kg/m2 Ideal body weight   25-29.9 kg/m2 Overweight Increased incidence by 20%  30-34.9 kg/m2 Obese (Class I) Increased incidence by 68%  35-39.9 kg/m2 Severe obesity (Class II) Increased incidence by 136%  >40 kg/m2 Extreme obesity (Class III) Increased incidence by 254%   BMI Readings from Last 4 Encounters:  03/12/18 32.78 kg/m  01/30/18 32.08 kg/m  01/11/18 33.28 kg/m  01/02/18 32.78 kg/m   Wt Readings from Last 4 Encounters:  03/12/18 235 lb (106.6 kg)  01/30/18 230 lb (104.3 kg)  01/11/18 238 lb 9.6 oz (108.2 kg)  01/02/18 235 lb (106.6 kg)  Psych/Mental status: Alert, oriented x 3 (person, place, & time)       Eyes: PERLA Respiratory: No evidence of acute respiratory distress  Cervical Spine Area Exam  Skin & Axial Inspection: No masses, redness, edema, swelling, or associated skin lesions Alignment: Symmetrical Functional ROM: Unrestricted ROM      Stability: No instability detected Muscle Tone/Strength: Functionally intact. No obvious neuro-muscular anomalies detected. Sensory (Neurological): Unimpaired Palpation: No palpable anomalies              Upper Extremity (UE) Exam    Side: Right upper extremity  Side: Left upper extremity  Skin & Extremity Inspection: Skin color, temperature, and hair growth  are WNL. No peripheral edema or cyanosis. No masses, redness, swelling, asymmetry, or associated skin lesions. No contractures.  Skin & Extremity Inspection: Skin color, temperature, and hair growth are WNL. No peripheral edema or cyanosis. No masses, redness, swelling, asymmetry, or associated skin lesions. No contractures.  Functional ROM: Unrestricted ROM          Functional ROM: Unrestricted ROM          Muscle Tone/Strength: Functionally intact. No obvious neuro-muscular anomalies detected.  Muscle Tone/Strength: Functionally intact. No obvious neuro-muscular anomalies detected.  Sensory (Neurological): Unimpaired          Sensory (Neurological): Unimpaired          Palpation: No palpable anomalies              Palpation: No palpable anomalies              Specialized Test(s): Deferred         Specialized Test(s): Deferred          Thoracic Spine Area Exam  Skin & Axial Inspection: No masses, redness, or swelling Alignment: Symmetrical Functional ROM: Unrestricted ROM Stability: No instability detected Muscle Tone/Strength: Functionally intact. No obvious neuro-muscular anomalies detected. Sensory (Neurological): Unimpaired Muscle strength &  Tone: No palpable anomalies  Lumbar Spine Area Exam  Skin & Axial Inspection: No masses, redness, or swelling Alignment: Symmetrical Functional ROM: Unrestricted ROM      Stability: No instability detected Muscle Tone/Strength: Functionally intact. No obvious neuro-muscular anomalies detected. Sensory (Neurological): Unimpaired Palpation: No palpable anomalies       Provocative Tests: Lumbar Hyperextension and rotation test: Improved after treatment       Lumbar Lateral bending test: evaluation deferred today       Patrick's Maneuver: evaluation deferred today                    Gait & Posture Assessment  Ambulation: Unassisted Gait: Relatively normal for age and body habitus Posture: WNL   Lower Extremity Exam    Side: Right lower  extremity  Side: Left lower extremity  Skin & Extremity Inspection: Skin color, temperature, and hair growth are WNL. No peripheral edema or cyanosis. No masses, redness, swelling, asymmetry, or associated skin lesions. No contractures.  Skin & Extremity Inspection: Skin color, temperature, and hair growth are WNL. No peripheral edema or cyanosis. No masses, redness, swelling, asymmetry, or associated skin lesions. No contractures.  Functional ROM: Unrestricted ROM          Functional ROM: Unrestricted ROM          Muscle Tone/Strength: Functionally intact. No obvious neuro-muscular anomalies detected.  Muscle Tone/Strength: Functionally intact. No obvious neuro-muscular anomalies detected.  Sensory (Neurological): Unimpaired  Sensory (Neurological): Unimpaired  Palpation: No palpable anomalies  Palpation: No palpable anomalies   Assessment  Primary Diagnosis & Pertinent Problem List: The primary encounter diagnosis was Spondylosis of lumbar region without myelopathy or radiculopathy. Diagnoses of Lumbar facet arthropathy, Chronic bilateral low back pain without sciatica, Degenerative disc disease, lumbar, and Arthritis, lumbar spine were also pertinent to this visit.  Status Diagnosis  Responding Controlled Responding 1. Spondylosis of lumbar region without myelopathy or radiculopathy   2. Lumbar facet arthropathy   3. Chronic bilateral low back pain without sciatica   4. Degenerative disc disease, lumbar   5. Arthritis, lumbar spine      47 year old male with a axial low back pain secondary to history of lumbar spondylosis, facet arthropathy, lumbar degenerative disc disease who presents for medication refill.  Patient endorsing significant benefit with medication regimen below.  He finds diclofenac 75 mg twice daily very effective in managing his axial low back pain and also contributing to an improvement in his functional status.  He denies any side effects.  Patient's hepatic panel which  was checked by his primary care physician showed elevated AST and ALT.  We will continue to monitor.  Patient's renal function was within normal limits.  Medication refill as below.  Patient to follow-up in 3 months.  Interventional history: Status post left and right L3, L4, L5 radio frequency ablation completed 11/20/2017 which was helpful.    Plan of Care  Pharmacotherapy (Medications Ordered): Meds ordered this encounter  Medications  . diclofenac (VOLTAREN) 75 MG EC tablet    Sig: Take 1 tablet (75 mg total) by mouth 2 (two) times daily.    Dispense:  60 tablet    Refill:  5  . tiZANidine (ZANAFLEX) 4 MG tablet    Sig: Take 1 tablet (4 mg total) by mouth every 8 (eight) hours as needed for muscle spasms.    Dispense:  90 tablet    Refill:  5  . amitriptyline (ELAVIL) 25 MG tablet    Sig:  Take 1 tablet (25 mg total) by mouth at bedtime.    Dispense:  30 tablet    Refill:  5     Provider-requested follow-up: Return in about 3 months (around 06/12/2018) for Medication Management. Time Note: Greater than 50% of the 25 minute(s) of face-to-face time spent with Mr. Nuon, was spent in counseling/coordination of care regarding: Mr. Gentzler primary cause of pain, the treatment plan, treatment alternatives, the risks and possible complications of proposed treatment, medication side effects, the appropriate use of his medications, realistic expectations and the goals of pain management (increased in functionality). Future Appointments  Date Time Provider [place]  08/08/2018 11:00 AM Lada, Satira Anis, MD [place] [facility]    Primary Care Physician: Arnetha Courser, MD Location: [facility] Note by: Gillis Santa, M.D Date: 03/12/2018; Time: 9:01 AM  There are no Patient Instructions on file for this visit.

## 2018-04-19 ENCOUNTER — Encounter: Payer: Self-pay | Admitting: Student in an Organized Health Care Education/Training Program

## 2018-06-12 ENCOUNTER — Ambulatory Visit
Payer: 59 | Attending: Student in an Organized Health Care Education/Training Program | Admitting: Student in an Organized Health Care Education/Training Program

## 2018-06-12 ENCOUNTER — Encounter: Payer: Self-pay | Admitting: Student in an Organized Health Care Education/Training Program

## 2018-06-12 ENCOUNTER — Other Ambulatory Visit: Payer: Self-pay

## 2018-06-12 VITALS — BP 168/127 | HR 63 | Temp 98.2°F | Resp 18 | Ht 72.0 in | Wt 240.0 lb

## 2018-06-12 DIAGNOSIS — M109 Gout, unspecified: Secondary | ICD-10-CM | POA: Diagnosis not present

## 2018-06-12 DIAGNOSIS — M545 Low back pain: Secondary | ICD-10-CM | POA: Diagnosis not present

## 2018-06-12 DIAGNOSIS — Z79899 Other long term (current) drug therapy: Secondary | ICD-10-CM | POA: Diagnosis not present

## 2018-06-12 DIAGNOSIS — D127 Benign neoplasm of rectosigmoid junction: Secondary | ICD-10-CM | POA: Insufficient documentation

## 2018-06-12 DIAGNOSIS — N200 Calculus of kidney: Secondary | ICD-10-CM | POA: Diagnosis not present

## 2018-06-12 DIAGNOSIS — G8929 Other chronic pain: Secondary | ICD-10-CM

## 2018-06-12 DIAGNOSIS — R002 Palpitations: Secondary | ICD-10-CM | POA: Diagnosis not present

## 2018-06-12 DIAGNOSIS — I700 Atherosclerosis of aorta: Secondary | ICD-10-CM | POA: Diagnosis not present

## 2018-06-12 DIAGNOSIS — R079 Chest pain, unspecified: Secondary | ICD-10-CM | POA: Diagnosis not present

## 2018-06-12 DIAGNOSIS — M7918 Myalgia, other site: Secondary | ICD-10-CM | POA: Insufficient documentation

## 2018-06-12 DIAGNOSIS — M47816 Spondylosis without myelopathy or radiculopathy, lumbar region: Secondary | ICD-10-CM

## 2018-06-12 DIAGNOSIS — E119 Type 2 diabetes mellitus without complications: Secondary | ICD-10-CM

## 2018-06-12 DIAGNOSIS — Z888 Allergy status to other drugs, medicaments and biological substances status: Secondary | ICD-10-CM | POA: Diagnosis not present

## 2018-06-12 DIAGNOSIS — M5136 Other intervertebral disc degeneration, lumbar region: Secondary | ICD-10-CM | POA: Diagnosis not present

## 2018-06-12 DIAGNOSIS — I2510 Atherosclerotic heart disease of native coronary artery without angina pectoris: Secondary | ICD-10-CM | POA: Insufficient documentation

## 2018-06-12 DIAGNOSIS — M542 Cervicalgia: Secondary | ICD-10-CM | POA: Insufficient documentation

## 2018-06-12 DIAGNOSIS — E785 Hyperlipidemia, unspecified: Secondary | ICD-10-CM | POA: Insufficient documentation

## 2018-06-12 DIAGNOSIS — I10 Essential (primary) hypertension: Secondary | ICD-10-CM | POA: Diagnosis not present

## 2018-06-12 DIAGNOSIS — Z981 Arthrodesis status: Secondary | ICD-10-CM | POA: Diagnosis not present

## 2018-06-12 DIAGNOSIS — Z794 Long term (current) use of insulin: Secondary | ICD-10-CM

## 2018-06-12 DIAGNOSIS — K589 Irritable bowel syndrome without diarrhea: Secondary | ICD-10-CM | POA: Insufficient documentation

## 2018-06-12 DIAGNOSIS — Z87891 Personal history of nicotine dependence: Secondary | ICD-10-CM | POA: Insufficient documentation

## 2018-06-12 MED ORDER — DICLOFENAC SODIUM 75 MG PO TBEC: 75.0000 mg | DELAYED_RELEASE_TABLET | Freq: Two times a day (BID) | ORAL | 5 refills | Status: DC

## 2018-06-12 NOTE — Progress Notes (Signed)
Nursing Pain Medication Assessment:  Safety precautions to be maintained throughout the outpatient stay will include: orient to surroundings, keep bed in low position, maintain call bell within reach at all times, provide assistance with transfer out of bed and ambulation.  Medication Inspection Compliance: Mr. Cutbirth did not comply with our request to bring his pills to be counted. He was reminded that bringing the medication bottles, even when empty, is a requirement.  Medication: None brought in. Pill/Patch Count: None available to be counted. Bottle Appearance: No container available. Did not bring bottle(s) to appointment. Filled Date: N/A Last Medication intake:  Yesterday 

## 2018-06-12 NOTE — Progress Notes (Signed)
Patient's Name: Donya Tomaro  MRN: [id number]  Referring Provider: Arnetha Courser, MD  DOB: [birth year]  PCP: Arnetha Courser, MD  DOS: 06/12/2018  Note by: Gillis Santa, MD  Service setting: Ambulatory outpatient  Specialty: Interventional Pain Management  Location: [facility] (AMB) Pain Management Facility    Patient type: Established   Primary Reason(s) for Visit: Encounter for prescription drug management. (Level of risk: moderate)  CC: Back Pain (lower)  HPI  Mr. Hanners is a 47 y.o. year old, male patient, who comes today for a medication management evaluation. He has Essential hypertension; Gout; IBS (irritable bowel syndrome); Family history of malignant neoplasm of gastrointestinal tract; Benign neoplasm of rectosigmoid junction; Palpitations; Decongestant abuse; Ketonuria; Glucosuria; Preventative health care; Chest pain; Diabetes mellitus ([place]); Encounter for medication monitoring; Coronary artery disease; Angioedema; Hyperlipidemia LDL goal <70; Elevated serum glutamic pyruvic transaminase (SGPT) level; Elevated liver enzymes; Serum total bilirubin elevated; Fatty liver; Chronic lower back pain; Degenerative disc disease, lumbar; Arthritis, lumbar spine; Calcification of abdominal aorta (HCC); and Elevated hemoglobin (HCC) on their problem list. His primarily concern today is the Back Pain (lower)  Pain Assessment: Location: Lower Back Radiating: Denies today, pt states he is getting a new pain between shoulder blades 9Sharp) Onset: More than a month ago Duration: Chronic pain Quality: Aching, Discomfort Severity: 0-No pain/10 (subjective, self-reported pain score)  Note: Reported level is compatible with observation.                         When using our objective Pain Scale, levels between 6 and 10/10 are said to belong in an emergency room, as it progressively worsens from a 6/10, described as severely limiting, requiring emergency care not usually available at an outpatient pain management  facility. At a 6/10 level, communication becomes difficult and requires great effort. Assistance to reach the emergency department may be required. Facial flushing and profuse sweating along with potentially dangerous increases in heart rate and blood pressure will be evident. Effect on ADL: "I do what I can" Timing: Constant Modifying factors: medications BP: (!) 168/127(pt states normal and on new meds)  HR: 63  Mr. Clevinger was last scheduled for an appointment on 03/12/2018 for medication management. During today's appointment we reviewed Mr. Sakuma chronic pain status, as well as his outpatient medication regimen.  Patient returns today for follow-up.  He states that his pain is overall very well controlled after lumbar radio frequency ablation and continuation of diclofenac 75 mg twice daily.  Patient endorses ability to perform activities of daily living as well as household chores including working outside and cutting the grass much easier to do.  He is pleased with his overall results.  He does describes occasional musculoskeletal/myofascial pain in his mid scapular region which gets better with stretching and physical therapy.  Otherwise patient continues his diclofenac 75 mg twice daily.  He was started on a new diabetes medication.  On his most recent lab work, his urine protein to creatinine ratio was elevated.  He will discuss blood pressure management with his primary care physician as it has been elevated on multiple occasions and this can lead to proteinuria as well as nephropathy.  The patient  reports that he does not use drugs. His body mass index is 32.55 kg/m.  Further details on both, my assessment(s), as well as the proposed treatment plan, please see below.  Laboratory Chemistry  Inflammation Markers (CRP: Acute Phase) (ESR: Chronic Phase) No results found  for: CRP, ESRSEDRATE, LATICACIDVEN                       Rheumatology Markers Lab Results  Component Value Date   RF  <14 08/04/2017   ANA Negative 09/21/2017   LABURIC 6.4 08/04/2017                        Renal Function Markers Lab Results  Component Value Date   BUN 19 08/04/2017   CREATININE 1.20 08/04/2017   GFRAA 85 08/04/2017   GFRNONAA 73 08/04/2017                             Hepatic Function Markers Lab Results  Component Value Date   AST 113 (H) 02/16/2018   ALT 155 (H) 02/16/2018   ALBUMIN 4.5 02/16/2018   ALKPHOS 97 02/16/2018                        Electrolytes Lab Results  Component Value Date   NA 139 08/04/2017   K 4.8 08/04/2017   CL 101 08/04/2017   CALCIUM 10.0 08/04/2017   MG 2.0 08/27/2016   PHOS 1.9 (L) 08/27/2016                        Neuropathy Markers Lab Results  Component Value Date   HGBA1C  07/08/2016    UNABLE TO REPORT A1C DUE TO UNKNOWN INTERFERING FACTOR                        Bone Pathology Markers Lab Results  Component Value Date   VD25OH 30 08/04/2017                         Coagulation Parameters Lab Results  Component Value Date   INR 0.96 08/11/2016   LABPROT 12.8 08/11/2016   PLT 195 08/04/2017                        Cardiovascular Markers Lab Results  Component Value Date   TROPONINI <0.03 07/08/2016   HGB 17.2 (H) 08/04/2017   HCT 51.1 (H) 08/04/2017                         CA Markers No results found for: CEA, CA125, LABCA2                      Note: Lab results reviewed.  Recent Diagnostic Imaging Results  DG Lumbar Spine Complete W/Bend CLINICAL DATA:  Chronic lumbago  EXAM: LUMBAR SPINE - COMPLETE WITH BENDING VIEWS  COMPARISON:  None.  FINDINGS: Frontal, weight-bearing neutral lateral, weight-bearing flexion lateral, weight-bearing extension lateral, spot lumbosacral lateral, and bilateral oblique views were obtained. The there are 5 non-rib-bearing lumbar type vertebral bodies. There is no fracture.  With neutral and extension lateral imaging, there is 7 mm of retrolisthesis of L1 on L2. On flexion  lateral imaging, there is 4 mm of retrolisthesis of L1 on L2.  With neutral and extension lateral imaging, there is 6 mm of retrolisthesis of L2 on L3. With flexion lateral imaging, there is 4 mm of retrolisthesis of L2 on L3. There is 3 mm of retrolisthesis of L4 on L5 with neutral lateral, flexion  lateral, and extension lateral imaging.  There is moderate disc space narrowing at L1-2. There is slightly milder disc space narrowing at L2-3 and L4-5. There is facet osteoarthritic change at L4-5 and L5-S1 bilaterally. There is atherosclerotic calcification in the aorta. There is a 3 mm calcification in the left kidney.  IMPRESSION: No fracture. Areas of spondylolisthesis as summarized above with areas of change in lateral alignment between neutral lateral, flexion lateral, extension lateral positioning as noted. Areas of osteoarthritic change, most notably at L1-2 and L2-3.  There is aortic atherosclerosis.  3 mm left renal calculus noted.  Aortic Atherosclerosis (ICD10-I70.0).  Electronically Signed   By: Lowella Grip III M.D.   On: 01/02/2018 11:32 DG Thoracic Spine 2 View CLINICAL DATA:  Chronic dorsalgia  EXAM: THORACIC SPINE 3 VIEWS  COMPARISON:  None.  FINDINGS: Frontal, lateral, and swimmer's views obtained. There is no fracture or spondylolisthesis. There is mild disc space narrowing at multiple levels in the upper and lower thoracic regions. No paraspinous lesion or erosion noted.  IMPRESSION: Osteoarthritic changes at several levels in the upper and lower thoracic region. Disc space narrowing is most notable at T9-10, T10-11, T11-12, and T12-L1. No erosive change. No fracture or spondylolisthesis.  Electronically Signed   By: Lowella Grip III M.D.   On: 01/02/2018 11:17 DG Cervical Spine With Flex & Extend CLINICAL DATA:  Chronic cervicalgia  EXAM: CERVICAL SPINE COMPLETE WITH FLEXION AND EXTENSION VIEWS  COMPARISON:   None.  FINDINGS: Frontal, neutral lateral, flexion lateral, extension lateral, open-mouth odontoid, and bilateral oblique views obtained. There is no evident fracture. There is no appreciable spondylolisthesis on neutral lateral imaging. There is no appreciable change in lateral alignment with flexion-extension.  The patient is status post anterior screw and plate fixation from R7-N1 with disc spacers noted at C4-5, C5-6, and C6-7. Support hardware appears intact at all levels.  There is moderate disc space narrowing at C7-T1. No other disc space narrowing is appreciable. There is calcification in the anterior ligament at C3-4. There is mild facet hypertrophy on the right at C4-5 causing mild exit foraminal narrowing. No similar changes seen elsewhere in the cervical region. No erosive change. Lung apices are clear.  There is relative lack of lordosis on the neutral lateral imaging. There is mild calcification in the left carotid artery.  IMPRESSION: Postoperative change from C4-C7 with support hardware intact. Moderate disc space narrowing C7-T1. Calcification anterior ligament at C3-4. Mild exit foraminal narrowing due to bony hypertrophy on the right at C4-5.  No fracture. No spondylolisthesis. No change in lateral alignment with flexion-extension. Patient's ability to flex and extend appears somewhat limited. Lack of lordosis on neutral lateral imaging suggests underlying muscle spasm.  Mild calcification noted in left carotid artery.  Electronically Signed   By: Lowella Grip III M.D.   On: 01/02/2018 11:15  Complexity Note: Imaging results reviewed. Results shared with Mr. Garringer, using Layman's terms.                         Meds   Current Outpatient Medications:  .  ACCU-CHEK SOFTCLIX LANCETS lancets, USE AS DIRECTED, Disp: 100 each, Rfl: 0 .  allopurinol (ZYLOPRIM) 100 MG tablet, Take 100 mg by mouth every morning. , Disp: , Rfl:  .  amitriptyline (ELAVIL)  25 MG tablet, Take 1 tablet (25 mg total) by mouth at bedtime., Disp: 30 tablet, Rfl: 5 .  atenolol (TENORMIN) 50 MG tablet, Take 1 tablet (50  mg total) by mouth daily., Disp: 90 tablet, Rfl: 3 .  belladonna-PHENObarbital (DONNATAL) 16.2 MG/5ML ELIX, Take 5 mLs (16.2 mg total) by mouth 4 (four) times daily as needed for cramping or pain. (Patient taking differently: Take 5 mLs by mouth 4 (four) times daily as needed for cramping or pain. ), Disp: 600 mL, Rfl: 1 .  cetirizine (ZYRTEC) 10 MG tablet, Take 10 mg by mouth at bedtime. , Disp: , Rfl:  .  colchicine 0.6 MG tablet, Take 0.6 mg by mouth daily as needed., Disp: , Rfl:  .  Dapagliflozin-metFORMIN HCl ER (XIGDUO XR) 04-999 MG TB24, Take by mouth., Disp: , Rfl:  .  diphenhydrAMINE (BENADRYL) 25 mg capsule, Take 25 mg by mouth at bedtime. , Disp: , Rfl:  .  EPINEPHrine (EPIPEN 2-PAK) 0.3 mg/0.3 mL IJ SOAJ injection, Inject 0.3 mg into the muscle once., Disp: , Rfl:  .  glucose blood (ACCU-CHEK ACTIVE STRIPS) test strip, Use as instructed, Disp: 100 each, Rfl: 12 .  glucose blood (ACCU-CHEK AVIVA PLUS) test strip, Check FSBS three to four times per day, Disp: 100 each, Rfl: 1 .  Insulin Degludec (TRESIBA FLEXTOUCH) 100 UNIT/ML SOPN, Inject 20 Units into the skin at bedtime. , Disp: 1 pen, Rfl: 0 .  Insulin Pen Needle (B-D UF III MINI PEN NEEDLES) 31G X 5 MM MISC, Once a day with insulin, Disp: 50 each, Rfl: 3 .  Melatonin 5 MG TABS, Take 5 mg by mouth at bedtime. , Disp: , Rfl:  .  ranitidine (ZANTAC) 150 MG capsule, Take 150 mg by mouth at bedtime. , Disp: , Rfl:  .  tiZANidine (ZANAFLEX) 4 MG tablet, Take 1 tablet (4 mg total) by mouth every 8 (eight) hours as needed for muscle spasms., Disp: 90 tablet, Rfl: 5 .  diclofenac (VOLTAREN) 75 MG EC tablet, Take 1 tablet (75 mg total) by mouth 2 (two) times daily., Disp: 60 tablet, Rfl: 5 .  metFORMIN (GLUCOPHAGE-XR) 500 MG 24 hr tablet, Take 1,500 mg by mouth every evening. Take with supper., Disp: ,  Rfl:  .  metFORMIN (GLUCOPHAGE-XR) 500 MG 24 hr tablet, Take 3 tablets by mouth daily., Disp: , Rfl: 3  ROS  Constitutional: Denies any fever or chills Gastrointestinal: No reported hemesis, hematochezia, vomiting, or acute GI distress Musculoskeletal: Denies any acute onset joint swelling, redness, loss of ROM, or weakness Neurological: No reported episodes of acute onset apraxia, aphasia, dysarthria, agnosia, amnesia, paralysis, loss of coordination, or loss of consciousness  Allergies  Mr. Vanhorn is allergic to lisinopril and ace inhibitors.  PFSH  Drug: Mr. Paxton  reports that he does not use drugs. Alcohol:  reports that he does not drink alcohol. Tobacco:  reports that he quit smoking about 3 years ago. His smoking use included cigarettes. He has a 10.00 pack-year smoking history. He has never used smokeless tobacco. Medical:  has a past medical history of Angioedema, Bronchitis, DDD (degenerative disc disease), cervical, Diabetes mellitus ([place]), Fatty liver (03/31/2017), GERD (gastroesophageal reflux disease), Gout, HTN (hypertension), Hypertension, IBS (irritable bowel syndrome), Seasonal allergies, and Spinal stenosis. Surgical: Mr. Gutridge  has a past surgical history that includes Shoulder surgery (Left, 2007); Nasal septum surgery (2004); ACDF with fusion (2007); Testicular torsion (1980s); Vasectomy; Colonoscopy with propofol (N/A, 06/26/2015); and Cardiac catheterization (Left, 08/15/2016). Family: family history includes Allergies in his son; Alzheimer's disease in his maternal grandmother; Arrhythmia in his mother; Breast cancer in his mother; Cancer in his maternal grandfather and mother; Diabetes in his  father; Heart disease in his brother; Hypertension in his father; Skin cancer in his mother.  Constitutional Exam  General appearance: Well nourished, well developed, and well hydrated. In no apparent acute distress Vitals:   06/12/18 0830 06/12/18 0832  BP:  (!) 168/127  Pulse: 63    Resp: 18   Temp: 98.2 F (36.8 C)   SpO2: 100%   Weight: 240 lb (108.9 kg)   Height: 6' (1.829 m)    BMI Assessment: Estimated body mass index is 32.55 kg/m as calculated from the following:   Height as of this encounter: 6' (1.829 m).   Weight as of this encounter: 240 lb (108.9 kg).  BMI interpretation table: BMI level Category Range association with higher incidence of chronic pain  <18 kg/m2 Underweight   18.5-24.9 kg/m2 Ideal body weight   25-29.9 kg/m2 Overweight Increased incidence by 20%  30-34.9 kg/m2 Obese (Class I) Increased incidence by 68%  35-39.9 kg/m2 Severe obesity (Class II) Increased incidence by 136%  >40 kg/m2 Extreme obesity (Class III) Increased incidence by 254%   Patient's current BMI Ideal Body weight  Body mass index is 32.55 kg/m. Ideal body weight: 77.6 kg (171 lb 1.2 oz) Adjusted ideal body weight: 90.1 kg (198 lb 10.3 oz)   BMI Readings from Last 4 Encounters:  06/12/18 32.55 kg/m  03/12/18 32.78 kg/m  01/30/18 32.08 kg/m  01/11/18 33.28 kg/m   Wt Readings from Last 4 Encounters:  06/12/18 240 lb (108.9 kg)  03/12/18 235 lb (106.6 kg)  01/30/18 230 lb (104.3 kg)  01/11/18 238 lb 9.6 oz (108.2 kg)  Psych/Mental status: Alert, oriented x 3 (person, place, & time)       Eyes: PERLA Respiratory: No evidence of acute respiratory distress  Cervical Spine Area Exam  Skin & Axial Inspection: No masses, redness, edema, swelling, or associated skin lesions Alignment: Symmetrical Functional ROM: Unrestricted ROM      Stability: No instability detected Muscle Tone/Strength: Functionally intact. No obvious neuro-muscular anomalies detected. Sensory (Neurological): Unimpaired Palpation: No palpable anomalies              Upper Extremity (UE) Exam    Side: Right upper extremity  Side: Left upper extremity  Skin & Extremity Inspection: Skin color, temperature, and hair growth are WNL. No peripheral edema or cyanosis. No masses, redness,  swelling, asymmetry, or associated skin lesions. No contractures.  Skin & Extremity Inspection: Skin color, temperature, and hair growth are WNL. No peripheral edema or cyanosis. No masses, redness, swelling, asymmetry, or associated skin lesions. No contractures.  Functional ROM: Unrestricted ROM          Functional ROM: Unrestricted ROM          Muscle Tone/Strength: Functionally intact. No obvious neuro-muscular anomalies detected.  Muscle Tone/Strength: Functionally intact. No obvious neuro-muscular anomalies detected.  Sensory (Neurological): Unimpaired          Sensory (Neurological): Unimpaired          Palpation: No palpable anomalies              Palpation: No palpable anomalies              Provocative Test(s):  Phalen's test: deferred Tinel's test: deferred Apley's scratch test (touch opposite shoulder):  Action 1 (Across chest): deferred Action 2 (Overhead): deferred Action 3 (LB reach): deferred   Provocative Test(s):  Phalen's test: deferred Tinel's test: deferred Apley's scratch test (touch opposite shoulder):  Action 1 (Across chest): deferred Action 2 (Overhead): deferred  Action 3 (LB reach): deferred    Thoracic Spine Area Exam  Skin & Axial Inspection: No masses, redness, or swelling Alignment: Symmetrical Functional ROM: Unrestricted ROM Stability: No instability detected Muscle Tone/Strength: Functionally intact. No obvious neuro-muscular anomalies detected. Sensory (Neurological): Musculoskeletal pain pattern Muscle strength & Tone: No palpable anomalies  Lumbar Spine Area Exam  Skin & Axial Inspection: No masses, redness, or swelling Alignment: Symmetrical Functional ROM: Unrestricted ROM       Stability: No instability detected Muscle Tone/Strength: Functionally intact. No obvious neuro-muscular anomalies detected. Sensory (Neurological): Unimpaired Palpation: No palpable anomalies       Provocative Tests: Lumbar Hyperextension/rotation test: deferred  today       Lumbar quadrant test (Kemp's test): deferred today       Lumbar Lateral bending test: deferred today       Patrick's Maneuver: deferred today                   FABER test: deferred today       Thigh-thrust test: deferred today       S-I compression test: deferred today       S-I distraction test: deferred today        Gait & Posture Assessment  Ambulation: Unassisted Gait: Relatively normal for age and body habitus Posture: WNL   Lower Extremity Exam    Side: Right lower extremity  Side: Left lower extremity  Stability: No instability observed          Stability: No instability observed          Skin & Extremity Inspection: Skin color, temperature, and hair growth are WNL. No peripheral edema or cyanosis. No masses, redness, swelling, asymmetry, or associated skin lesions. No contractures.  Skin & Extremity Inspection: Skin color, temperature, and hair growth are WNL. No peripheral edema or cyanosis. No masses, redness, swelling, asymmetry, or associated skin lesions. No contractures.  Functional ROM: Unrestricted ROM                  Functional ROM: Unrestricted ROM                  Muscle Tone/Strength: Functionally intact. No obvious neuro-muscular anomalies detected.  Muscle Tone/Strength: Functionally intact. No obvious neuro-muscular anomalies detected.  Sensory (Neurological): Unimpaired  Sensory (Neurological): Unimpaired  Palpation: No palpable anomalies  Palpation: No palpable anomalies   Assessment  Primary Diagnosis & Pertinent Problem List: The primary encounter diagnosis was Spondylosis of lumbar region without myelopathy or radiculopathy. Diagnoses of Lumbar facet arthropathy, Chronic bilateral low back pain without sciatica, Degenerative disc disease, lumbar, S/P cervical spinal fusion, and Type 2 diabetes mellitus without complication, with long-term current use of insulin ([place]) were also pertinent to this visit.  Status Diagnosis   Controlled Controlled Controlled 1. Spondylosis of lumbar region without myelopathy or radiculopathy   2. Lumbar facet arthropathy   3. Chronic bilateral low back pain without sciatica   4. Degenerative disc disease, lumbar   5. S/P cervical spinal fusion   6. Type 2 diabetes mellitus without complication, with long-term current use of insulin ([place])      General Recommendations: The pain condition that the patient suffers from is best treated with a multidisciplinary approach that involves an increase in physical activity to prevent de-conditioning and worsening of the pain cycle, as well as psychological counseling (formal and/or informal) to address the co-morbid psychological affects of pain. Treatment will often involve judicious use of pain medications and interventional procedures  to decrease the pain, allowing the patient to participate in the physical activity that will ultimately produce long-lasting pain reductions. The goal of the multidisciplinary approach is to return the patient to a higher level of overall function and to restore their ability to perform activities of daily living.  47 year old male with a axial low back pain secondary to history of lumbar spondylosis, facet arthropathy, lumbar degenerative disc disease who presents for medication refill.  Patient endorsing significant benefit with medication regimen below.  He finds diclofenac 75 mg twice daily very effective in managing his axial low back pain and also contributing to an improvement in his functional status.  He denies any side effects.  He states that his pain is overall very well controlled after lumbar radio frequency ablation and continuation of diclofenac 75 mg twice daily.  Patient endorses ability to perform activities of daily living as well as household chores including working outside and cutting the grass much easier to do.  He is pleased with his overall results.  He does describes occasional  musculoskeletal/myofascial pain in his mid scapular region which gets better with stretching and physical therapy.  Otherwise patient continues his diclofenac 75 mg twice daily.  He was started on a new diabetes medication.  On his most recent lab work, his urine protein to creatinine ratio was elevated.  He will discuss blood pressure management with his primary care physician as it has been elevated on multiple occasions and this can lead to proteinuria as well as nephropathy.    Plan of Care  Pharmacotherapy (Medications Ordered): Meds ordered this encounter  Medications  . diclofenac (VOLTAREN) 75 MG EC tablet    Sig: Take 1 tablet (75 mg total) by mouth 2 (two) times daily.    Dispense:  60 tablet    Refill:  5   Interventional history: Status post left and right L3, L4, L5 radio frequency ablation completed 11/20/2017 which was helpful.   Provider-requested follow-up: Return in about 3 months (around 09/12/2018).  Time Note: Greater than 50% of the 25 minute(s) of face-to-face time spent with Mr. Christofferson, was spent in counseling/coordination of care regarding: Mr. Dismuke primary cause of pain, the treatment plan, medication side effects, realistic expectations and the goals of pain management (increased in functionality). Future Appointments  Date Time Provider [place]  08/08/2018 11:00 AM Arnetha Courser, MD [facility]  09/11/2018  8:30 AM Gillis Santa, MD [facility] None    Primary Care Physician: Arnetha Courser, MD Location: [facility] Management Facility Note by: Gillis Santa, M.D Date: 06/12/2018; Time: 8:58 AM  There are no Patient Instructions on file for this visit.

## 2018-06-12 NOTE — Progress Notes (Deleted)
Patient's Name: John Hartman  MRN: [id number]  Referring Provider: Arnetha Courser, MD  DOB: 24-Feb-1971  PCP: Arnetha Courser, MD  DOS: 06/12/2018  Note by: Gillis Santa, MD  Service setting: Ambulatory outpatient  Specialty: Interventional Pain Management  Location: [facility] (AMB) Pain Management Facility    Patient type: Established   Primary Reason(s) for Visit: Encounter for prescription drug management. (Level of risk: moderate)  CC: Back Pain (lower)  HPI  John Hartman is a 47 y.o. year old, male patient, who comes today for a medication management evaluation. He has Essential hypertension; Gout; IBS (irritable bowel syndrome); Family history of malignant neoplasm of gastrointestinal tract; Benign neoplasm of rectosigmoid junction; Palpitations; Decongestant abuse; Ketonuria; Glucosuria; Preventative health care; Chest pain; Diabetes mellitus ([place]); Encounter for medication monitoring; Coronary artery disease; Angioedema; Hyperlipidemia LDL goal <70; Elevated serum glutamic pyruvic transaminase (SGPT) level; Elevated liver enzymes; Serum total bilirubin elevated; Fatty liver; Chronic lower back pain; Degenerative disc disease, lumbar; Arthritis, lumbar spine; Calcification of abdominal aorta (HCC); and Elevated hemoglobin (HCC) on their problem list. His primarily concern today is the Back Pain (lower)  Pain Assessment: Location: Lower Back Radiating: Denies today, pt states he is getting a new pain between shoulder blades 9Sharp) Onset: More than a month ago Duration: Chronic pain Quality: Aching, Discomfort Severity: 0-No pain/10 (subjective, self-reported pain score)  Note: Reported level is compatible with observation.                         When using our objective Pain Scale, levels between 6 and 10/10 are said to belong in an emergency room, as it progressively worsens from a 6/10, described as severely limiting, requiring emergency care not usually available at an outpatient pain management  facility. At a 6/10 level, communication becomes difficult and requires great effort. Assistance to reach the emergency department may be required. Facial flushing and profuse sweating along with potentially dangerous increases in heart rate and blood pressure will be evident. Effect on ADL: "I do what I can" Timing: Constant Modifying factors: medications BP: (!) 168/127(pt states normal and on new meds)  HR: 63  John Hartman was last scheduled for an appointment on 03/12/2018 for medication management. During today's appointment we reviewed John Hartman chronic pain status, as well as his outpatient medication regimen.  The patient  reports that he does not use drugs. His body mass index is 32.55 kg/m.  Further details on both, my assessment(s), as well as the proposed treatment plan, please see below.   Laboratory Chemistry  Inflammation Markers (CRP: Acute Phase) (ESR: Chronic Phase) No results found for: CRP, ESRSEDRATE, LATICACIDVEN                       Rheumatology Markers Lab Results  Component Value Date   RF <14 08/04/2017   ANA Negative 09/21/2017   LABURIC 6.4 08/04/2017                        Renal Function Markers Lab Results  Component Value Date   BUN 19 08/04/2017   CREATININE 1.20 08/04/2017   GFRAA 85 08/04/2017   GFRNONAA 73 08/04/2017                             Hepatic Function Markers Lab Results  Component Value Date   AST 113 (H) 02/16/2018   ALT  155 (H) 02/16/2018   ALBUMIN 4.5 02/16/2018   ALKPHOS 97 02/16/2018                        Electrolytes Lab Results  Component Value Date   NA 139 08/04/2017   K 4.8 08/04/2017   CL 101 08/04/2017   CALCIUM 10.0 08/04/2017   MG 2.0 08/27/2016   PHOS 1.9 (L) 08/27/2016                        Neuropathy Markers Lab Results  Component Value Date   HGBA1C  07/08/2016    UNABLE TO REPORT A1C DUE TO UNKNOWN INTERFERING FACTOR                        Bone Pathology Markers Lab Results  Component  Value Date   VD25OH 30 08/04/2017                         Coagulation Parameters Lab Results  Component Value Date   INR 0.96 08/11/2016   LABPROT 12.8 08/11/2016   PLT 195 08/04/2017                        Cardiovascular Markers Lab Results  Component Value Date   TROPONINI <0.03 07/08/2016   HGB 17.2 (H) 08/04/2017   HCT 51.1 (H) 08/04/2017                         CA Markers No results found for: CEA, CA125, LABCA2                      Note: Lab results reviewed.  Recent Diagnostic Imaging Results  DG Lumbar Spine Complete W/Bend CLINICAL DATA:  Chronic lumbago  EXAM: LUMBAR SPINE - COMPLETE WITH BENDING VIEWS  COMPARISON:  None.  FINDINGS: Frontal, weight-bearing neutral lateral, weight-bearing flexion lateral, weight-bearing extension lateral, spot lumbosacral lateral, and bilateral oblique views were obtained. The there are 5 non-rib-bearing lumbar type vertebral bodies. There is no fracture.  With neutral and extension lateral imaging, there is 7 mm of retrolisthesis of L1 on L2. On flexion lateral imaging, there is 4 mm of retrolisthesis of L1 on L2.  With neutral and extension lateral imaging, there is 6 mm of retrolisthesis of L2 on L3. With flexion lateral imaging, there is 4 mm of retrolisthesis of L2 on L3. There is 3 mm of retrolisthesis of L4 on L5 with neutral lateral, flexion lateral, and extension lateral imaging.  There is moderate disc space narrowing at L1-2. There is slightly milder disc space narrowing at L2-3 and L4-5. There is facet osteoarthritic change at L4-5 and L5-S1 bilaterally. There is atherosclerotic calcification in the aorta. There is a 3 mm calcification in the left kidney.  IMPRESSION: No fracture. Areas of spondylolisthesis as summarized above with areas of change in lateral alignment between neutral lateral, flexion lateral, extension lateral positioning as noted. Areas of osteoarthritic change, most notably at L1-2  and L2-3.  There is aortic atherosclerosis.  3 mm left renal calculus noted.  Aortic Atherosclerosis (ICD10-I70.0).  Electronically Signed   By: Lowella Grip III M.D.   On: 01/02/2018 11:32 DG Thoracic Spine 2 View CLINICAL DATA:  Chronic dorsalgia  EXAM: THORACIC SPINE 3 VIEWS  COMPARISON:  None.  FINDINGS: Frontal, lateral, and swimmer's  views obtained. There is no fracture or spondylolisthesis. There is mild disc space narrowing at multiple levels in the upper and lower thoracic regions. No paraspinous lesion or erosion noted.  IMPRESSION: Osteoarthritic changes at several levels in the upper and lower thoracic region. Disc space narrowing is most notable at T9-10, T10-11, T11-12, and T12-L1. No erosive change. No fracture or spondylolisthesis.  Electronically Signed   By: Lowella Grip III M.D.   On: 01/02/2018 11:17 DG Cervical Spine With Flex & Extend CLINICAL DATA:  Chronic cervicalgia  EXAM: CERVICAL SPINE COMPLETE WITH FLEXION AND EXTENSION VIEWS  COMPARISON:  None.  FINDINGS: Frontal, neutral lateral, flexion lateral, extension lateral, open-mouth odontoid, and bilateral oblique views obtained. There is no evident fracture. There is no appreciable spondylolisthesis on neutral lateral imaging. There is no appreciable change in lateral alignment with flexion-extension.  The patient is status post anterior screw and plate fixation from F1-M3 with disc spacers noted at C4-5, C5-6, and C6-7. Support hardware appears intact at all levels.  There is moderate disc space narrowing at C7-T1. No other disc space narrowing is appreciable. There is calcification in the anterior ligament at C3-4. There is mild facet hypertrophy on the right at C4-5 causing mild exit foraminal narrowing. No similar changes seen elsewhere in the cervical region. No erosive change. Lung apices are clear.  There is relative lack of lordosis on the neutral lateral  imaging. There is mild calcification in the left carotid artery.  IMPRESSION: Postoperative change from C4-C7 with support hardware intact. Moderate disc space narrowing C7-T1. Calcification anterior ligament at C3-4. Mild exit foraminal narrowing due to bony hypertrophy on the right at C4-5.  No fracture. No spondylolisthesis. No change in lateral alignment with flexion-extension. Patient's ability to flex and extend appears somewhat limited. Lack of lordosis on neutral lateral imaging suggests underlying muscle spasm.  Mild calcification noted in left carotid artery.  Electronically Signed   By: Lowella Grip III M.D.   On: 01/02/2018 11:15  Complexity Note: Imaging results reviewed. Results shared with John Hartman, using Layman's terms.                         Meds   Current Outpatient Medications:  .  ACCU-CHEK SOFTCLIX LANCETS lancets, USE AS DIRECTED, Disp: 100 each, Rfl: 0 .  allopurinol (ZYLOPRIM) 100 MG tablet, Take 100 mg by mouth every morning. , Disp: , Rfl:  .  amitriptyline (ELAVIL) 25 MG tablet, Take 1 tablet (25 mg total) by mouth at bedtime., Disp: 30 tablet, Rfl: 5 .  atenolol (TENORMIN) 50 MG tablet, Take 1 tablet (50 mg total) by mouth daily., Disp: 90 tablet, Rfl: 3 .  belladonna-PHENObarbital (DONNATAL) 16.2 MG/5ML ELIX, Take 5 mLs (16.2 mg total) by mouth 4 (four) times daily as needed for cramping or pain. (Patient taking differently: Take 5 mLs by mouth 4 (four) times daily as needed for cramping or pain. ), Disp: 600 mL, Rfl: 1 .  cetirizine (ZYRTEC) 10 MG tablet, Take 10 mg by mouth at bedtime. , Disp: , Rfl:  .  colchicine 0.6 MG tablet, Take 0.6 mg by mouth daily as needed., Disp: , Rfl:  .  Dapagliflozin-metFORMIN HCl ER (XIGDUO XR) 04-999 MG TB24, Take by mouth., Disp: , Rfl:  .  diphenhydrAMINE (BENADRYL) 25 mg capsule, Take 25 mg by mouth at bedtime. , Disp: , Rfl:  .  EPINEPHrine (EPIPEN 2-PAK) 0.3 mg/0.3 mL IJ SOAJ injection, Inject 0.3 mg into  the muscle once., Disp: , Rfl:  .  glucose blood (ACCU-CHEK ACTIVE STRIPS) test strip, Use as instructed, Disp: 100 each, Rfl: 12 .  glucose blood (ACCU-CHEK AVIVA PLUS) test strip, Check FSBS three to four times per day, Disp: 100 each, Rfl: 1 .  Insulin Degludec (TRESIBA FLEXTOUCH) 100 UNIT/ML SOPN, Inject 20 Units into the skin at bedtime. , Disp: 1 pen, Rfl: 0 .  Insulin Pen Needle (B-D UF III MINI PEN NEEDLES) 31G X 5 MM MISC, Once a day with insulin, Disp: 50 each, Rfl: 3 .  Melatonin 5 MG TABS, Take 5 mg by mouth at bedtime. , Disp: , Rfl:  .  ranitidine (ZANTAC) 150 MG capsule, Take 150 mg by mouth at bedtime. , Disp: , Rfl:  .  tiZANidine (ZANAFLEX) 4 MG tablet, Take 1 tablet (4 mg total) by mouth every 8 (eight) hours as needed for muscle spasms., Disp: 90 tablet, Rfl: 5 .  diclofenac (VOLTAREN) 75 MG EC tablet, Take 1 tablet (75 mg total) by mouth 2 (two) times daily. (Patient not taking: Reported on 06/12/2018), Disp: 60 tablet, Rfl: 5 .  metFORMIN (GLUCOPHAGE-XR) 500 MG 24 hr tablet, Take 1,500 mg by mouth every evening. Take with supper., Disp: , Rfl:  .  metFORMIN (GLUCOPHAGE-XR) 500 MG 24 hr tablet, Take 3 tablets by mouth daily., Disp: , Rfl: 3  ROS  Constitutional: Denies any fever or chills Gastrointestinal: No reported hemesis, hematochezia, vomiting, or acute GI distress Musculoskeletal: Denies any acute onset joint swelling, redness, loss of ROM, or weakness Neurological: No reported episodes of acute onset apraxia, aphasia, dysarthria, agnosia, amnesia, paralysis, loss of coordination, or loss of consciousness  Allergies  John Hartman is allergic to lisinopril and ace inhibitors.  PFSH  Drug: John Hartman  reports that he does not use drugs. Alcohol:  reports that he does not drink alcohol. Tobacco:  reports that he quit smoking about 3 years ago. His smoking use included cigarettes. He has a 10.00 pack-year smoking history. He has never used smokeless tobacco. Medical:  has  a past medical history of Angioedema, Bronchitis, DDD (degenerative disc disease), cervical, Diabetes mellitus ([place]), Fatty liver (03/31/2017), GERD (gastroesophageal reflux disease), Gout, HTN (hypertension), Hypertension, IBS (irritable bowel syndrome), Seasonal allergies, and Spinal stenosis. Surgical: John Hartman  has a past surgical history that includes Shoulder surgery (Left, 2007); Nasal septum surgery (2004); ACDF with fusion (2007); Testicular torsion (1980s); Vasectomy; Colonoscopy with propofol (N/A, 06/26/2015); and Cardiac catheterization (Left, 08/15/2016). Family: family history includes Allergies in his son; Alzheimer's disease in his maternal grandmother; Arrhythmia in his mother; Breast cancer in his mother; Cancer in his maternal grandfather and mother; Diabetes in his father; Heart disease in his brother; Hypertension in his father; Skin cancer in his mother.  Constitutional Exam  General appearance: Well nourished, well developed, and well hydrated. In no apparent acute distress Vitals:   06/12/18 0830 06/12/18 0832  BP:  (!) 168/127  Pulse: 63   Resp: 18   Temp: 98.2 F (36.8 C)   SpO2: 100%   Weight: 240 lb (108.9 kg)   Height: 6' (1.829 m)    BMI Assessment: Estimated body mass index is 32.55 kg/m as calculated from the following:   Height as of this encounter: 6' (1.829 m).   Weight as of this encounter: 240 lb (108.9 kg).  BMI interpretation table: BMI level Category Range association with higher incidence of chronic pain  <18 kg/m2 Underweight   18.5-24.9 kg/m2 Ideal body weight  25-29.9 kg/m2 Overweight Increased incidence by 20%  30-34.9 kg/m2 Obese (Class I) Increased incidence by 68%  35-39.9 kg/m2 Severe obesity (Class II) Increased incidence by 136%  >40 kg/m2 Extreme obesity (Class III) Increased incidence by 254%   Patient's current BMI Ideal Body weight  Body mass index is 32.55 kg/m. Ideal body weight: 77.6 kg (171 lb 1.2 oz) Adjusted ideal body  weight: 90.1 kg (198 lb 10.3 oz)   BMI Readings from Last 4 Encounters:  06/12/18 32.55 kg/m  03/12/18 32.78 kg/m  01/30/18 32.08 kg/m  01/11/18 33.28 kg/m   Wt Readings from Last 4 Encounters:  06/12/18 240 lb (108.9 kg)  03/12/18 235 lb (106.6 kg)  01/30/18 230 lb (104.3 kg)  01/11/18 238 lb 9.6 oz (108.2 kg)  Psych/Mental status: Alert, oriented x 3 (person, place, & time)       Eyes: PERLA Respiratory: No evidence of acute respiratory distress  Cervical Spine Area Exam  Skin & Axial Inspection: No masses, redness, edema, swelling, or associated skin lesions Alignment: Symmetrical Functional ROM: Unrestricted ROM      Stability: No instability detected Muscle Tone/Strength: Functionally intact. No obvious neuro-muscular anomalies detected. Sensory (Neurological): Unimpaired Palpation: No palpable anomalies              Upper Extremity (UE) Exam    Side: Right upper extremity  Side: Left upper extremity  Skin & Extremity Inspection: Skin color, temperature, and hair growth are WNL. No peripheral edema or cyanosis. No masses, redness, swelling, asymmetry, or associated skin lesions. No contractures.  Skin & Extremity Inspection: Skin color, temperature, and hair growth are WNL. No peripheral edema or cyanosis. No masses, redness, swelling, asymmetry, or associated skin lesions. No contractures.  Functional ROM: Unrestricted ROM          Functional ROM: Unrestricted ROM          Muscle Tone/Strength: Functionally intact. No obvious neuro-muscular anomalies detected.  Muscle Tone/Strength: Functionally intact. No obvious neuro-muscular anomalies detected.  Sensory (Neurological): Unimpaired          Sensory (Neurological): Unimpaired          Palpation: No palpable anomalies              Palpation: No palpable anomalies              Provocative Test(s):  Phalen's test: deferred Tinel's test: deferred Apley's scratch test (touch opposite shoulder):  Action 1 (Across chest):  deferred Action 2 (Overhead): deferred Action 3 (LB reach): deferred   Provocative Test(s):  Phalen's test: deferred Tinel's test: deferred Apley's scratch test (touch opposite shoulder):  Action 1 (Across chest): deferred Action 2 (Overhead): deferred Action 3 (LB reach): deferred    Thoracic Spine Area Exam  Skin & Axial Inspection: No masses, redness, or swelling Alignment: Symmetrical Functional ROM: Unrestricted ROM Stability: No instability detected Muscle Tone/Strength: Functionally intact. No obvious neuro-muscular anomalies detected. Sensory (Neurological): Unimpaired Muscle strength & Tone: No palpable anomalies  Lumbar Spine Area Exam  Skin & Axial Inspection: No masses, redness, or swelling Alignment: Symmetrical Functional ROM: Unrestricted ROM       Stability: No instability detected Muscle Tone/Strength: Functionally intact. No obvious neuro-muscular anomalies detected. Sensory (Neurological): Unimpaired Palpation: No palpable anomalies       Provocative Tests: Lumbar Hyperextension/rotation test: deferred today       Lumbar quadrant test (Kemp's test): deferred today       Lumbar Lateral bending test: deferred today  Patrick's Maneuver: deferred today                   FABER test: deferred today       Thigh-thrust test: deferred today       S-I compression test: deferred today       S-I distraction test: deferred today        Gait & Posture Assessment  Ambulation: Unassisted Gait: Relatively normal for age and body habitus Posture: WNL   Lower Extremity Exam    Side: Right lower extremity  Side: Left lower extremity  Stability: No instability observed          Stability: No instability observed          Skin & Extremity Inspection: Skin color, temperature, and hair growth are WNL. No peripheral edema or cyanosis. No masses, redness, swelling, asymmetry, or associated skin lesions. No contractures.  Skin & Extremity Inspection: Skin color,  temperature, and hair growth are WNL. No peripheral edema or cyanosis. No masses, redness, swelling, asymmetry, or associated skin lesions. No contractures.  Functional ROM: Unrestricted ROM                  Functional ROM: Unrestricted ROM                  Muscle Tone/Strength: Functionally intact. No obvious neuro-muscular anomalies detected.  Muscle Tone/Strength: Functionally intact. No obvious neuro-muscular anomalies detected.  Sensory (Neurological): Unimpaired  Sensory (Neurological): Unimpaired  Palpation: No palpable anomalies  Palpation: No palpable anomalies   Assessment  Primary Diagnosis & Pertinent Problem List: There were no encounter diagnoses.  Status Diagnosis  Controlled Controlled Controlled No diagnosis found.  Problems updated and reviewed during this visit: No problems updated. Plan of Care  Pharmacotherapy (Medications Ordered): No orders of the defined types were placed in this encounter.  Lab-work, procedure(s), and/or referral(s): No orders of the defined types were placed in this encounter.   Pharmacological management options:  Opioid Analgesics: We'll take over management today. See above orders Membrane stabilizer: We have discussed the possibility of optimizing this mode of therapy, if tolerated Muscle relaxant: We have discussed the possibility of a trial NSAID: We have discussed the possibility of a trial Other analgesic(s): To be determined at a later time   Interventional management options: Planned, scheduled, and/or pending:    ***   Considering:   ***   PRN Procedures:   To be determined at a later time   Provider-requested follow-up: No follow-ups on file.  Future Appointments  Date Time Provider [place]  08/08/2018 11:00 AM Lada, Satira Anis, MD [place] [facility] [place]    Primary Care Physician: Arnetha Courser, MD Location: [facility] Note by: Gillis Santa, M.D Date: 06/12/2018; Time: 8:46  AM  There are no Patient Instructions on file for this visit.

## 2018-06-24 ENCOUNTER — Ambulatory Visit
Admission: EM | Admit: 2018-06-24 | Discharge: 2018-06-24 | Disposition: A | Discharge status: Home / Self Care | Payer: 59 | Attending: Family Medicine | Admitting: Family Medicine

## 2018-06-24 ENCOUNTER — Other Ambulatory Visit: Payer: Self-pay

## 2018-06-24 DIAGNOSIS — M5440 Lumbago with sciatica, unspecified side: Secondary | ICD-10-CM

## 2018-06-24 MED ORDER — KETOROLAC TROMETHAMINE 60 MG/2ML IM SOLN
60.0000 mg | Freq: Once | INTRAMUSCULAR | Status: AC
  Administered 2018-06-24: 60 mg via INTRAMUSCULAR

## 2018-06-24 NOTE — Discharge Instructions (Signed)
Rest.  You can take the diclofenac later today.  Zanaflex as needed.  Take care  Dr. Lacinda Axon

## 2018-06-24 NOTE — ED Triage Notes (Signed)
Pt states chronic back pain and treated at pain clinic. States he did too much yard work yesterday and now both sciatic nerves are aggravated. Pain 9/10

## 2018-06-24 NOTE — ED Provider Notes (Signed)
[facility]    CSN: [id number] Arrival date & time: 06/24/18  0801  History   Chief Complaint Chief Complaint  Patient presents with  . Back Pain   HPI  47 year old male with chronic back pain presents with an acute flare of back pain.  Patient states that he was doing yard work yesterday and thinks he did too much.  He states that he is having worsening low back pain with radiation down his right buttock and right posterior thigh.  Pain is severe, 9/10.  He took diclofenac yesterday without resolution.  He has not taken anything today.  He reports difficulty with activity.  He is currently hunched over in pain.  No reports of fall.  No known relieving factors.  No other associated symptoms.  No other complaints.  Past Medical History:  Diagnosis Date  . Angioedema   . Bronchitis   . DDD (degenerative disc disease), cervical   . Diabetes mellitus ([place])   . Fatty liver 03/31/2017   [place] April 2018  . GERD (gastroesophageal reflux disease)   . Gout   . HTN (hypertension)   . Hypertension    CONTROLLED ON MEDS  . IBS (irritable bowel syndrome)   . Seasonal allergies   . Spinal stenosis     Patient Active Problem List   Diagnosis Date Noted  . Elevated hemoglobin ([place]) 08/11/2017  . Chronic lower back pain 08/04/2017  . Degenerative disc disease, lumbar 08/04/2017  . 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 Calcification of abdominal aorta (HCC) 08/04/2017  . Fatty liver 03/31/2017  . Elevated liver enzymes 02/11/2017  . Serum total bilirubin elevated 02/11/2017  . Hyperlipidemia LDL goal <70 01/04/2017  . Elevated serum glutamic pyruvic transaminase (SGPT) level 01/04/2017  . Angioedema   . Encounter for medication monitoring 08/25/2016  . Coronary artery disease 08/25/2016  . Diabetes mellitus ([place])   . Chest pain 08/08/2016  . Preventative health care 08/02/2016  . Ketonuria 07/12/2016  . Glucosuria 07/12/2016  . 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  Palpitations 08/12/2015  . Family history of malignant neoplasm of gastrointestinal tract   . Benign neoplasm of rectosigmoid junction   . Essential hypertension 05/28/2015  . Gout 05/28/2015  . IBS (irritable bowel syndrome) 05/28/2015    Past Surgical History:  Procedure Laterality Date  . ACDF with fusion  2007  . CARDIAC CATHETERIZATION Left 08/15/2016   Procedure: Left Heart Cath and Coronary Angiography;  Surgeon: Wellington Hampshire, MD;  Location: [facility];  Service: Cardiovascular;  Laterality: Left;  . COLONOSCOPY WITH PROPOFOL N/A 06/26/2015   Procedure: COLONOSCOPY WITH PROPOFOL;  Surgeon: Lucilla Lame, MD;  Location: [place];  Service: Endoscopy;  Laterality: N/A;  with biopsies  . NASAL SEPTUM SURGERY  2004  . SHOULDER SURGERY Left 2007  . Testicular torsion  1980s  . VASECTOMY         Home Medications    Prior to Admission medications   Medication Sig Start Date End Date Taking? Authorizing Provider  ACCU-CHEK SOFTCLIX LANCETS lancets USE AS DIRECTED 07/23/16   Arnetha Courser, MD  allopurinol (ZYLOPRIM) 100 MG tablet Take 100 mg by mouth every morning.     [provider]  amitriptyline (ELAVIL) 25 MG tablet Take 1 tablet (25 mg total) by mouth at bedtime. 03/12/18   Gillis Santa, MD  atenolol (TENORMIN) 50 MG tablet Take 1 tablet (50 mg total) by mouth daily. 09/22/17   Arnetha Courser, MD  belladonna-PHENObarbital (  DONNATAL) 16.2 MG/5ML ELIX Take 5 mLs (16.2 mg total) by mouth 4 (four) times daily as needed for cramping or pain. Patient taking differently: Take 5 mLs by mouth 4 (four) times daily as needed for cramping or pain.  05/28/15   Lucilla Lame, MD  cetirizine (ZYRTEC) 10 MG tablet Take 10 mg by mouth at bedtime.     [provider]  colchicine 0.6 MG tablet Take 0.6 mg by mouth daily as needed.    [provider]  Dapagliflozin-metFORMIN HCl ER (XIGDUO XR) 04-999 MG TB24 Take by mouth. 05/16/18   [provider]  diclofenac (VOLTAREN) 75 MG EC tablet Take 1 tablet (75 mg total) by mouth 2 (two) times daily. 06/12/18   Gillis Santa, MD  diphenhydrAMINE (BENADRYL) 25 mg capsule Take 25 mg by mouth at bedtime.     [provider]  EPINEPHrine (EPIPEN 2-PAK) 0.3 mg/0.3 mL IJ SOAJ injection Inject 0.3 mg into the muscle once.    [provider]  glucose blood (ACCU-CHEK ACTIVE STRIPS) test strip Use as instructed 07/09/16   Arnetha Courser, MD  glucose blood (ACCU-CHEK AVIVA PLUS) test strip Check FSBS three to four times per day 07/23/16   Arnetha Courser, MD  Insulin Degludec (TRESIBA FLEXTOUCH) 100 UNIT/ML SOPN Inject 20 Units into the skin at bedtime.  07/12/16   Lada, Satira Anis, MD  Insulin Pen Needle (B-D UF III MINI PEN NEEDLES) 31G X 5 MM MISC Once a day with insulin 07/09/16   Lada, Satira Anis, MD  Melatonin 5 MG TABS Take 5 mg by mouth at bedtime.     [provider]  metFORMIN (GLUCOPHAGE-XR) 500 MG 24 hr tablet Take 1,500 mg by mouth every evening. Take with supper. 07/15/16 01/02/18  [provider]  metFORMIN (GLUCOPHAGE-XR) 500 MG 24 hr tablet Take 3 tablets by mouth daily. 01/09/18   [provider]  ranitidine (ZANTAC) 150 MG capsule Take 150 mg by mouth at bedtime.     [provider]  tiZANidine (ZANAFLEX) 4 MG tablet Take 1 tablet (4 mg total) by mouth every 8 (eight) hours as needed for muscle spasms. 03/12/18   Gillis Santa, MD    Family History Family History  Problem Relation Age of Onset  . Breast cancer Mother   . Skin cancer Mother   . Arrhythmia Mother        A-fib  . Cancer Mother        breast, colon?, skin  . Hypertension Father   . Diabetes Father   . Cancer Maternal Grandfather   . Heart disease Brother        stent  . Allergies Son   . Alzheimer's disease Maternal Grandmother   . Stroke Neg Hx   . COPD Neg Hx     Social History Social History   Tobacco Use  . Smoking status: Former Smoker     Packs/day: 1.00    Years: 10.00    Pack years: 10.00    Types: Cigarettes    Last attempt to quit: 04/15/2015    Years since quitting: 3.1  . Smokeless tobacco: Never Used  Substance Use Topics  . Alcohol use: No    Alcohol/week: 0.0 oz  . Drug use: No     Allergies   Lisinopril and Ace inhibitors   Review of Systems Review of Systems  Genitourinary: Negative.   Musculoskeletal: Positive for back pain.   Physical Exam Triage Vital Signs ED Triage Vitals  Enc Vitals Group     BP 06/24/18 0813 (!) 154/120     Pulse Rate 06/24/18 0813 69     Resp 06/24/18 0813 18     Temp 06/24/18 0813 98.2 F (36.8 C)     Temp Source 06/24/18 0813 Oral     SpO2 06/24/18 0813 100 %     Weight 06/24/18 0815 240 lb (108.9 kg)     Height 06/24/18 0815 6' (1.829 m)     Head Circumference --      Peak Flow --      Pain Score 06/24/18 0815 9     Pain Loc --      Pain Edu? --      Excl. in [place]? --    Updated Vital Signs BP (!) 154/120 (BP Location: Left Arm)   Pulse 69   Temp 98.2 F (36.8 C) (Oral)   Resp 18   Ht 6' (1.829 m)   Wt 240 lb (108.9 kg)   SpO2 100%   BMI 32.55 kg/m  Physical Exam  Constitutional: He is oriented to person, place, and time. He appears well-developed. No distress.  Cardiovascular: Normal rate and regular rhythm.  Pulmonary/Chest: Effort normal. He has no wheezes. He has no rales.  Musculoskeletal:  Decreased range of motion secondary to pain.  Patient is currently hunched over in pain.  I did not fully examine lumbar spine as I did not want to cause him more discomfort.   Neurological: He is alert and oriented to person, place, and time.  Psychiatric: He has a normal mood and affect. His behavior is normal.  Nursing note and vitals reviewed.  UC Treatments / Results  Labs (all labs ordered are listed, but only abnormal results are displayed) Labs Reviewed - No data to display  EKG None  Radiology No results found.  Procedures Procedures  (including critical care time)  Medications Ordered in UC Medications  ketorolac (TORADOL) injection 60 mg (60 mg Intramuscular Given 06/24/18 0831)    Initial Impression / Assessment and Plan / UC Course  I have reviewed the triage vital signs and the nursing notes.  Pertinent labs & imaging results that were available during my care of the patient were reviewed by me and considered in my medical decision making (see chart for details).    47 year old male presents with acute on chronic back pain.  Toradol given here.  I advised him to take his home Zanaflex and diclofenac later today.  I offered him pain medication and patient declined.  Advised rest and supportive care.  Final Clinical Impressions(s) / UC Diagnoses   Final diagnoses:  Acute bilateral low back pain with sciatica, sciatica laterality unspecified     Discharge Instructions     Rest.  You can take the diclofenac later today.  Zanaflex as needed.  Take care  Dr. Lacinda Axon    ED Prescriptions    None     Controlled Substance Prescriptions [place] Controlled Substance Registry consulted? Not Applicable   Coral Spikes, [place] 06/24/18 [phone]

## 2018-06-25 ENCOUNTER — Telehealth: Payer: Self-pay

## 2018-06-25 ENCOUNTER — Emergency Department
Admission: EM | Admit: 2018-06-25 | Discharge: 2018-06-25 | Disposition: A | Discharge status: Home / Self Care | Payer: 59 | Attending: Emergency Medicine | Admitting: Emergency Medicine

## 2018-06-25 ENCOUNTER — Encounter: Payer: Self-pay | Admitting: Student in an Organized Health Care Education/Training Program

## 2018-06-25 ENCOUNTER — Encounter: Payer: Self-pay | Admitting: Emergency Medicine

## 2018-06-25 DIAGNOSIS — M5441 Lumbago with sciatica, right side: Secondary | ICD-10-CM | POA: Diagnosis not present

## 2018-06-25 DIAGNOSIS — Z79899 Other long term (current) drug therapy: Secondary | ICD-10-CM | POA: Insufficient documentation

## 2018-06-25 DIAGNOSIS — Z87891 Personal history of nicotine dependence: Secondary | ICD-10-CM | POA: Diagnosis not present

## 2018-06-25 DIAGNOSIS — I2510 Atherosclerotic heart disease of native coronary artery without angina pectoris: Secondary | ICD-10-CM | POA: Insufficient documentation

## 2018-06-25 DIAGNOSIS — I10 Essential (primary) hypertension: Secondary | ICD-10-CM | POA: Diagnosis not present

## 2018-06-25 DIAGNOSIS — E119 Type 2 diabetes mellitus without complications: Secondary | ICD-10-CM | POA: Diagnosis not present

## 2018-06-25 DIAGNOSIS — Z794 Long term (current) use of insulin: Secondary | ICD-10-CM | POA: Diagnosis not present

## 2018-06-25 DIAGNOSIS — M549 Dorsalgia, unspecified: Secondary | ICD-10-CM | POA: Diagnosis present

## 2018-06-25 MED ORDER — ORPHENADRINE CITRATE 30 MG/ML IJ SOLN
30.0000 mg | Freq: Two times a day (BID) | INTRAMUSCULAR | Status: DC
  Administered 2018-06-25: 30 mg via INTRAMUSCULAR
  Filled 2018-06-25: qty 1

## 2018-06-25 MED ORDER — LIDOCAINE 5 % EX PTCH: 1.0000 | MEDICATED_PATCH | Freq: Two times a day (BID) | CUTANEOUS | 0 refills | Status: DC

## 2018-06-25 MED ORDER — OXYCODONE-ACETAMINOPHEN 5-325 MG PO TABS: 1.0000 | ORAL_TABLET | ORAL | 0 refills | Status: DC | PRN

## 2018-06-25 MED ORDER — LIDOCAINE 5 % EX PTCH
1.0000 | MEDICATED_PATCH | CUTANEOUS | Status: DC
  Administered 2018-06-25: 1 via TRANSDERMAL
  Filled 2018-06-25: qty 1

## 2018-06-25 MED ORDER — OXYCODONE-ACETAMINOPHEN 5-325 MG PO TABS
1.0000 | ORAL_TABLET | Freq: Once | ORAL | Status: AC
  Administered 2018-06-25: 1 via ORAL
  Filled 2018-06-25: qty 1

## 2018-06-25 NOTE — ED Notes (Signed)
First Nurse Note:  Patient complaining of back pain.  Declines WC.  States he is a [facility] at the [facility] and "both of the Dr's are out of town".

## 2018-06-25 NOTE — ED Provider Notes (Signed)
[facility] Emergency Department Provider Note  ____________________________________________  Time seen: Approximately 11:51 AM  I have reviewed the triage vital signs and the nursing notes.   HISTORY  Chief Complaint Back Pain    HPI John Hartman is a 47 y.o. male that presents emergency department for evaluation of bilateral back pain for 3 days.  Patient went to urgent care yesterday for left-sided back pain and was given a shot of Toradol.  This usually resolved his symptoms.  This morning he woke up and had right-sided back pain that radiates down the front of his right leg.  No bowel or bladder dysfunction or saddle paresthesias.  He has been walking but with pain.  He had full back x-rays completed a couple months ago and has not yet followed up with [facility].  He states that he knows he has had several surgeries in his future but was told that he has not yet a candidate for surgery. No IV drug use. No fever, chills, nausea, vomiting, abdominal pain.  Past Medical History:  Diagnosis Date  . Angioedema   . Bronchitis   . DDD (degenerative disc disease), cervical   . Diabetes mellitus ([place])   . Fatty liver 03/31/2017   [place] April 2018  . GERD (gastroesophageal reflux disease)   . Gout   . HTN (hypertension)   . Hypertension    CONTROLLED ON MEDS  . IBS (irritable bowel syndrome)   . Seasonal allergies   . Spinal stenosis     Patient Active Problem List   Diagnosis Date Noted  . Elevated hemoglobin ([place]) 08/11/2017  . Chronic lower back pain 08/04/2017  . Degenerative disc disease, lumbar 08/04/2017  . 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 Calcification of abdominal aorta (HCC) 08/04/2017  . Fatty liver 03/31/2017  . Elevated liver enzymes 02/11/2017  . Serum total bilirubin elevated 02/11/2017  . Hyperlipidemia LDL goal <70 01/04/2017  . Elevated serum glutamic pyruvic transaminase (SGPT) level 01/04/2017  . Angioedema   . Encounter for  medication monitoring 08/25/2016  . Coronary artery disease 08/25/2016  . Diabetes mellitus ([place])   . Chest pain 08/08/2016  . Preventative health care 08/02/2016  . Ketonuria 07/12/2016  . Glucosuria 07/12/2016  . 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 Palpitations 08/12/2015  . Family history of malignant neoplasm of gastrointestinal tract   . Benign neoplasm of rectosigmoid junction   . Essential hypertension 05/28/2015  . Gout 05/28/2015  . IBS (irritable bowel syndrome) 05/28/2015    Past Surgical History:  Procedure Laterality Date  . ACDF with fusion  2007  . CARDIAC CATHETERIZATION Left 08/15/2016   Procedure: Left Heart Cath and Coronary Angiography;  Surgeon: Wellington Hampshire, MD;  Location: [facility];  Service: Cardiovascular;  Laterality: Left;  . COLONOSCOPY WITH PROPOFOL N/A 06/26/2015   Procedure: COLONOSCOPY WITH PROPOFOL;  Surgeon: Lucilla Lame, MD;  Location: [facility];  Service: Endoscopy;  Laterality: N/A;  with biopsies  . NASAL SEPTUM SURGERY  2004  . SHOULDER SURGERY Left 2007  . Testicular torsion  1980s  . VASECTOMY      Prior to Admission medications   Medication Sig Start Date End Date Taking? Authorizing Provider  ACCU-CHEK SOFTCLIX LANCETS lancets USE AS DIRECTED 07/23/16   Arnetha Courser, MD  allopurinol (ZYLOPRIM) 100 MG tablet Take 100 mg by mouth every morning.     [provider]  amitriptyline (ELAVIL) 25 MG tablet Take 1 tablet (25 mg total) by mouth  at bedtime. 03/12/18   Gillis Santa, MD  atenolol (TENORMIN) 50 MG tablet Take 1 tablet (50 mg total) by mouth daily. 09/22/17   Lada, Satira Anis, MD  belladonna-PHENObarbital (DONNATAL) 16.2 MG/5ML ELIX Take 5 mLs (16.2 mg total) by mouth 4 (four) times daily as needed for cramping or pain. Patient taking differently: Take 5 mLs by mouth 4 (four) times daily as needed for cramping or pain.  05/28/15   Lucilla Lame, MD  cetirizine (ZYRTEC) 10 MG tablet Take 10 mg by mouth at  bedtime.     [provider]  colchicine 0.6 MG tablet Take 0.6 mg by mouth daily as needed.    [provider]  Dapagliflozin-metFORMIN HCl ER (XIGDUO XR) 04-999 MG TB24 Take by mouth. 05/16/18   [provider]  diclofenac (VOLTAREN) 75 MG EC tablet Take 1 tablet (75 mg total) by mouth 2 (two) times daily. 06/12/18   Gillis Santa, MD  diphenhydrAMINE (BENADRYL) 25 mg capsule Take 25 mg by mouth at bedtime.     [provider]  EPINEPHrine (EPIPEN 2-PAK) 0.3 mg/0.3 mL IJ SOAJ injection Inject 0.3 mg into the muscle once.    [provider]  glucose blood (ACCU-CHEK ACTIVE STRIPS) test strip Use as instructed 07/09/16   Arnetha Courser, MD  glucose blood (ACCU-CHEK AVIVA PLUS) test strip Check FSBS three to four times per day 07/23/16   Arnetha Courser, MD  Insulin Degludec (TRESIBA FLEXTOUCH) 100 UNIT/ML SOPN Inject 20 Units into the skin at bedtime.  07/12/16   Lada, Satira Anis, MD  Insulin Pen Needle (B-D UF III MINI PEN NEEDLES) 31G X 5 MM MISC Once a day with insulin 07/09/16   Lada, Satira Anis, MD  lidocaine (LIDODERM) 5 % Place 1 patch onto the skin every 12 (twelve) hours. Remove & Discard patch within 12 hours or as directed by MD 06/25/18 06/25/19  Laban Emperor, PA-C  Melatonin 5 MG TABS Take 5 mg by mouth at bedtime.     [provider]  metFORMIN (GLUCOPHAGE-XR) 500 MG 24 hr tablet Take 1,500 mg by mouth every evening. Take with supper. 07/15/16 01/02/18  [provider]  metFORMIN (GLUCOPHAGE-XR) 500 MG 24 hr tablet Take 3 tablets by mouth daily. 01/09/18   [provider]  oxyCODONE-acetaminophen (PERCOCET) 5-325 MG tablet Take 1 tablet by mouth every 4 (four) hours as needed for severe pain. 06/25/18 06/25/19  Laban Emperor, PA-C  ranitidine (ZANTAC) 150 MG capsule Take 150 mg by mouth at bedtime.     [provider]  tiZANidine (ZANAFLEX) 4 MG tablet Take 1 tablet (4 mg total) by mouth every 8 (eight) hours as needed  for muscle spasms. 03/12/18   Gillis Santa, MD    Allergies Lisinopril and Ace inhibitors  Family History  Problem Relation Age of Onset  . Breast cancer Mother   . Skin cancer Mother   . Arrhythmia Mother        A-fib  . Cancer Mother        breast, colon?, skin  . Hypertension Father   . Diabetes Father   . Cancer Maternal Grandfather   . Heart disease Brother        stent  . Allergies Son   . Alzheimer's disease Maternal Grandmother   . Stroke Neg Hx   . COPD Neg Hx     Social History Social History   Tobacco Use  . Smoking status: Former Smoker    Packs/day: 1.00  Years: 10.00    Pack years: 10.00    Types: Cigarettes    Last attempt to quit: 04/15/2015    Years since quitting: 3.1  . Smokeless tobacco: Never Used  Substance Use Topics  . Alcohol use: No    Alcohol/week: 0.0 oz  . Drug use: No     Review of Systems  Constitutional: No fever/chills Gastrointestinal: No abdominal pain.  No nausea, no vomiting.  Musculoskeletal: Positive for back pain.  Skin: Negative for rash, abrasions, lacerations, ecchymosis. Neurological: Negative for headaches, numbness or tingling   ____________________________________________   PHYSICAL EXAM:  VITAL SIGNS: ED Triage Vitals  Enc Vitals Group     BP 06/25/18 1104 (!) 142/94     Pulse Rate 06/25/18 1104 65     Resp 06/25/18 1104 20     Temp 06/25/18 1104 97.7 F (36.5 C)     Temp Source 06/25/18 1104 Oral     SpO2 06/25/18 1104 96 %     Weight 06/25/18 1105 235 lb (106.6 kg)     Height 06/25/18 1105 6' (1.829 m)     Head Circumference --      Peak Flow --      Pain Score 06/25/18 1105 9     Pain Loc --      Pain Edu? --      Excl. in [place]? --      Constitutional: Alert and oriented. Well appearing and in no acute distress. Eyes: Conjunctivae are normal. PERRL. EOMI. Head: Atraumatic. ENT:      Ears:      Nose: No congestion/rhinnorhea.      Mouth/Throat: Mucous membranes are moist.  Neck: No  stridor.  Cardiovascular: Normal rate, regular rhythm.  Good peripheral circulation. Respiratory: Normal respiratory effort without tachypnea or retractions. Lungs CTAB. Good air entry to the bases with no decreased or absent breath sounds. Gastrointestinal: Bowel sounds 4 quadrants. Soft and nontender to palpation. No guarding or rigidity. No palpable masses. No distention. No CVA tenderness. Musculoskeletal: Full range of motion to all extremities. No gross deformities appreciated.  Tenderness to palpation over left SI joint.  Tenderness to palpation over right lumbar paraspinal muscles and left right SI joint.  Positive straight leg raise.  Slow gait.  Able to move around in bed without assistance. Strength and sensation equal in lower extremities. Neurologic:  Normal speech and language. No gross focal neurologic deficits are appreciated.  Skin:  Skin is warm, dry and intact. No rash noted. Psychiatric: Mood and affect are normal. Speech and behavior are normal. Patient exhibits appropriate insight and judgement.   ____________________________________________   LABS (all labs ordered are listed, but only abnormal results are displayed)  Labs Reviewed - No data to display ____________________________________________  EKG   ____________________________________________  RADIOLOGY   No results found.  ____________________________________________    PROCEDURES  Procedure(s) performed:    Procedures    Medications  orphenadrine (NORFLEX) injection 30 mg (has no administration in time range)  oxyCODONE-acetaminophen (PERCOCET/ROXICET) 5-325 MG per tablet 1 tablet (has no administration in time range)  lidocaine (LIDODERM) 5 % 1 patch (has no administration in time range)     ____________________________________________   INITIAL IMPRESSION / ASSESSMENT AND PLAN / ED COURSE  Pertinent labs & imaging results that were available during my care of the patient were  reviewed by me and considered in my medical decision making (see chart for details).  Review of the [place] CSRS was performed in accordance of the  [facility] prior to dispensing any controlled drugs.   Patient's diagnosis is consistent with sciatica.  Vital signs and exam are reassuring.  Percocet, Norflex were given in ED.  Patient received a Toradol injection yesterday from urgent care.  He will continue tizanidine and diclofenac.  He has diabetes so we will avoid steroids at this time.  Patient will be discharged home with prescriptions for a short course of Percocet and Lidoderm patches. Patient is to follow up with PCP and ortho as directed. Patient is given ED precautions to return to the ED for any worsening or new symptoms.     ____________________________________________  FINAL CLINICAL IMPRESSION(S) / ED DIAGNOSES  Final diagnoses:  Acute bilateral low back pain with right-sided sciatica      NEW MEDICATIONS STARTED DURING THIS VISIT:  ED Discharge Orders        Ordered    oxyCODONE-acetaminophen (PERCOCET) 5-325 MG tablet  Every 4 hours PRN     06/25/18 1210    lidocaine (LIDODERM) 5 %  Every 12 hours     06/25/18 1210          This chart was dictated using voice recognition software/Dragon. Despite best efforts to proofread, errors can occur which can change the meaning. Any change was purely unintentional.    Laban Emperor, PA-C 06/25/18 1326    Lavonia Drafts, MD 06/25/18 1330

## 2018-06-25 NOTE — Telephone Encounter (Signed)
Patient's back is hurting really bad and wants to know what he is supposed to do since Dr. Holley Raring is out this week. Please call with advice.

## 2018-06-25 NOTE — Telephone Encounter (Signed)
Spoke with Crystal and she advised me to instruct patient to go to the ED if he felt needed.  Informed patient.  Patient states understanding.

## 2018-06-25 NOTE — ED Triage Notes (Signed)
Patient presents to ED via POV from home with c/o lower back pain. History of same, seen by pain management clinic. Patient reports doing yard work on Saturday and the pain being present since. Patient reports shooting pain down his right leg. Denies loss of bowel or bladder.

## 2018-06-25 NOTE — ED Notes (Signed)
See triage note  Presents with lower back pain  States he has a hx of chronic back pain and goes to pain clinic for same  Did some yard work on Saturday  Developed pain yesterday  Was seen at Urgent care and given a Toradol shot  But this am pain is worse  Having increased pain into right leg  Ambulates with slight limp d/t pain  Feeling like something is pressing on nerve

## 2018-07-03 ENCOUNTER — Encounter: Payer: Self-pay | Admitting: Family Medicine

## 2018-07-03 ENCOUNTER — Other Ambulatory Visit: Payer: Self-pay | Admitting: Family Medicine

## 2018-07-03 DIAGNOSIS — E785 Hyperlipidemia, unspecified: Secondary | ICD-10-CM

## 2018-07-03 DIAGNOSIS — K760 Fatty (change of) liver, not elsewhere classified: Secondary | ICD-10-CM

## 2018-07-03 NOTE — Progress Notes (Signed)
Orders entered

## 2018-07-09 ENCOUNTER — Other Ambulatory Visit: Payer: Self-pay | Admitting: Student in an Organized Health Care Education/Training Program

## 2018-07-09 MED ORDER — METHYLPREDNISOLONE 4 MG PO TBPK: ORAL_TABLET | ORAL | 0 refills | Status: AC

## 2018-07-09 NOTE — Progress Notes (Signed)
Spoke with patient today over the phone.  Patient went to urgent care last week for acute on chronic low back pain that is radiating down to his lower extremities that started after exerting himself over July 4 weekend when he was working outside.  Patient describes paresthesias in his lower extremities even when he is resting.  Denies any bowel or bladder dysfunction.  Full strength but does have pain in his low back that radiates.  At urgent care, patient was given intramuscular injection of Toradol which was helpful for 1 to 2 days.  Patient continued to have low back pain and proceeded to the emergency department and received intramuscular Norflex which was moderately helpful.  He calls in today continuing to have lower extremity back pain that is radiating.  He describes numbness and tingling in his lower extremities even when he is resting.  Again the patient denies any obvious lower externally weakness or bowel or bladder dysfunction.  Patient could be having acute on chronic low back pain secondary to acute on chronic lumbar radiculopathy and her lumbar spondylosis.  Although patient is on insulin, we discussed risks and benefits of steroid therapy given that the patient has failed to obtain significant relief for his acute pain exacerbation with NSAIDs including diclofenac, Toradol as well as intramuscular Norflex.  Patient informed of risks of steroid therapy which can include irritability, difficulty sleeping, increased blood pressure, increased blood sugars, swelling.  Patient will call clinic on Thursday to report on health progress.  Requested Prescriptions   Signed Prescriptions Disp Refills  . methylPREDNISolone (MEDROL) 4 MG TBPK tablet 21 tablet 0    Sig: Follow package instructions.

## 2018-07-12 ENCOUNTER — Telehealth: Payer: Self-pay | Admitting: Student in an Organized Health Care Education/Training Program

## 2018-07-12 NOTE — Telephone Encounter (Signed)
Scheduled patient for Tue 07-17-18 at 2:45

## 2018-07-12 NOTE — Telephone Encounter (Signed)
Patient called to update status, meds seemed to have helped sciatic into legs, still having pain standing for any length of time. Please call to discuss next options [phone]

## 2018-07-12 NOTE — Telephone Encounter (Signed)
Please Schedule an appointment to come to to discuss options with Dr Holley Raring.

## 2018-07-17 ENCOUNTER — Encounter: Payer: Self-pay | Admitting: Student in an Organized Health Care Education/Training Program

## 2018-07-17 ENCOUNTER — Other Ambulatory Visit: Payer: Self-pay

## 2018-07-17 ENCOUNTER — Ambulatory Visit
Admission: RE | Admit: 2018-07-17 | Discharge: 2018-07-17 | Disposition: A | Discharge status: Home / Self Care | Payer: 59 | Source: Ambulatory Visit | Attending: Student in an Organized Health Care Education/Training Program | Admitting: Student in an Organized Health Care Education/Training Program

## 2018-07-17 ENCOUNTER — Ambulatory Visit
Payer: 59 | Attending: Student in an Organized Health Care Education/Training Program | Admitting: Student in an Organized Health Care Education/Training Program

## 2018-07-17 VITALS — BP 127/92 | HR 59 | Temp 98.5°F | Resp 18 | Ht 71.0 in | Wt 240.0 lb

## 2018-07-17 DIAGNOSIS — I2510 Atherosclerotic heart disease of native coronary artery without angina pectoris: Secondary | ICD-10-CM | POA: Diagnosis not present

## 2018-07-17 DIAGNOSIS — E785 Hyperlipidemia, unspecified: Secondary | ICD-10-CM | POA: Diagnosis not present

## 2018-07-17 DIAGNOSIS — D127 Benign neoplasm of rectosigmoid junction: Secondary | ICD-10-CM | POA: Insufficient documentation

## 2018-07-17 DIAGNOSIS — I10 Essential (primary) hypertension: Secondary | ICD-10-CM | POA: Insufficient documentation

## 2018-07-17 DIAGNOSIS — M109 Gout, unspecified: Secondary | ICD-10-CM | POA: Insufficient documentation

## 2018-07-17 DIAGNOSIS — Z794 Long term (current) use of insulin: Secondary | ICD-10-CM

## 2018-07-17 DIAGNOSIS — M51369 Other intervertebral disc degeneration, lumbar region without mention of lumbar back pain or lower extremity pain: Secondary | ICD-10-CM

## 2018-07-17 DIAGNOSIS — I700 Atherosclerosis of aorta: Secondary | ICD-10-CM | POA: Diagnosis not present

## 2018-07-17 DIAGNOSIS — Z791 Long term (current) use of non-steroidal anti-inflammatories (NSAID): Secondary | ICD-10-CM | POA: Insufficient documentation

## 2018-07-17 DIAGNOSIS — M5416 Radiculopathy, lumbar region: Secondary | ICD-10-CM | POA: Diagnosis not present

## 2018-07-17 DIAGNOSIS — M4726 Other spondylosis with radiculopathy, lumbar region: Secondary | ICD-10-CM | POA: Insufficient documentation

## 2018-07-17 DIAGNOSIS — M25552 Pain in left hip: Secondary | ICD-10-CM | POA: Diagnosis not present

## 2018-07-17 DIAGNOSIS — Z87891 Personal history of nicotine dependence: Secondary | ICD-10-CM | POA: Diagnosis not present

## 2018-07-17 DIAGNOSIS — M545 Low back pain: Secondary | ICD-10-CM | POA: Diagnosis not present

## 2018-07-17 DIAGNOSIS — M5116 Intervertebral disc disorders with radiculopathy, lumbar region: Secondary | ICD-10-CM | POA: Diagnosis not present

## 2018-07-17 DIAGNOSIS — M47816 Spondylosis without myelopathy or radiculopathy, lumbar region: Secondary | ICD-10-CM | POA: Diagnosis not present

## 2018-07-17 DIAGNOSIS — Z79899 Other long term (current) drug therapy: Secondary | ICD-10-CM | POA: Insufficient documentation

## 2018-07-17 DIAGNOSIS — K589 Irritable bowel syndrome without diarrhea: Secondary | ICD-10-CM | POA: Insufficient documentation

## 2018-07-17 DIAGNOSIS — E119 Type 2 diabetes mellitus without complications: Secondary | ICD-10-CM | POA: Diagnosis not present

## 2018-07-17 DIAGNOSIS — M5136 Other intervertebral disc degeneration, lumbar region: Secondary | ICD-10-CM | POA: Diagnosis not present

## 2018-07-17 DIAGNOSIS — G8929 Other chronic pain: Secondary | ICD-10-CM

## 2018-07-17 DIAGNOSIS — Z981 Arthrodesis status: Secondary | ICD-10-CM

## 2018-07-17 MED ORDER — AMITRIPTYLINE HCL 25 MG PO TABS: 50.0000 mg | ORAL_TABLET | Freq: Every day | ORAL | 5 refills | Status: DC

## 2018-07-17 MED ORDER — HYDROCODONE-ACETAMINOPHEN 5-325 MG PO TABS: 1.0000 | ORAL_TABLET | Freq: Two times a day (BID) | ORAL | 0 refills | Status: AC | PRN

## 2018-07-17 MED ORDER — CELECOXIB 200 MG PO CAPS: 200.0000 mg | ORAL_CAPSULE | Freq: Every day | ORAL | 2 refills | Status: DC

## 2018-07-17 NOTE — Progress Notes (Signed)
Patient's Name: John Hartman  MRN: [id number]  Referring Provider: Arnetha Courser, MD  DOB: 02-14-1971  PCP: Arnetha Courser, MD  DOS: 07/17/2018  Note by: Gillis Santa, MD  Service setting: Ambulatory outpatient  Specialty: Interventional Pain Management  Location: [facility] (AMB) Pain Management Facility    Patient type: Established   Primary Reason(s) for Visit: Evaluation of chronic illnesses with exacerbation, or progression (Level of risk: moderate) CC: Back Pain (low)  HPI  John Hartman is a 47 y.o. year old, male patient, who comes today for a follow-up evaluation. He has Essential hypertension; Gout; IBS (irritable bowel syndrome); Family history of malignant neoplasm of gastrointestinal tract; Benign neoplasm of rectosigmoid junction; Palpitations; Decongestant abuse; Ketonuria; Glucosuria; Preventative health care; Chest pain; Diabetes mellitus ([place]); Encounter for medication monitoring; Coronary artery disease; Angioedema; Hyperlipidemia LDL goal <70; Elevated serum glutamic pyruvic transaminase (SGPT) level; Elevated liver enzymes; Serum total bilirubin elevated; Fatty liver; Chronic lower back pain; Degenerative disc disease, lumbar; Arthritis, lumbar spine; Calcification of abdominal aorta ([place]); and Elevated hemoglobin ([place]) on their problem list. John Hartman was last seen on 07/12/2018. His primarily concern today is the Back Pain (low)  Pain Assessment: Location: Lower Back Radiating: radiates into left hip Onset: More than a month ago Duration: Chronic pain Quality: Aching, Dull, Constant Severity: 6 /10 (subjective, self-reported pain score)  Note: Reported level is inconsistent with clinical observations. Clinically the patient looks like a 4/10 A 4/10 is viewed as "Moderately Severe" and described as impossible to ignore for more than a few minutes. With effort, patients may still be able to manage work or participate in some social activities. Very difficult to concentrate. Signs of  autonomic nervous system discharge are evident: dilated pupils (mydriasis); mild sweating (diaphoresis); sleep interference. Heart rate becomes elevated (>115 bpm). Diastolic blood pressure (lower number) rises above 100 mmHg. Patients find relief in laying down and not moving. Information on the proper use of the pain scale provided to the patient today. When using our objective Pain Scale, levels between 6 and 10/10 are said to belong in an emergency room, as it progressively worsens from a 6/10, described as severely limiting, requiring emergency care not usually available at an outpatient pain management facility. At a 6/10 level, communication becomes difficult and requires great effort. Assistance to reach the emergency department may be required. Facial flushing and profuse sweating along with potentially dangerous increases in heart rate and blood pressure will be evident. Effect on ADL: Limits activities Timing: Constant Modifying factors: denies BP: (!) 127/92  HR: (!) 59  Further details on both, my assessment(s), as well as the proposed treatment plan, please see below.  Laboratory Chemistry  Inflammation Markers (CRP: Acute Phase) (ESR: Chronic Phase) No results found for: CRP, ESRSEDRATE, LATICACIDVEN                       Rheumatology Markers Lab Results  Component Value Date   RF <14 08/04/2017   ANA Negative 09/21/2017   LABURIC 6.4 08/04/2017                        Renal Function Markers Lab Results  Component Value Date   BUN 19 08/04/2017   CREATININE 1.20 08/04/2017   GFRAA 85 08/04/2017   GFRNONAA 73 08/04/2017  Hepatic Function Markers Lab Results  Component Value Date   AST 113 (H) 02/16/2018   ALT 155 (H) 02/16/2018   ALBUMIN 4.5 02/16/2018   ALKPHOS 97 02/16/2018                        Electrolytes Lab Results  Component Value Date   NA 139 08/04/2017   K 4.8 08/04/2017   CL 101 08/04/2017   CALCIUM 10.0 08/04/2017    MG 2.0 08/27/2016   PHOS 1.9 (L) 08/27/2016                        Neuropathy Markers Lab Results  Component Value Date   HGBA1C  07/08/2016    UNABLE TO REPORT A1C DUE TO UNKNOWN INTERFERING FACTOR                        Bone Pathology Markers Lab Results  Component Value Date   VD25OH 30 08/04/2017                         Coagulation Parameters Lab Results  Component Value Date   INR 0.96 08/11/2016   LABPROT 12.8 08/11/2016   PLT 195 08/04/2017                        Cardiovascular Markers Lab Results  Component Value Date   TROPONINI <0.03 07/08/2016   HGB 17.2 (H) 08/04/2017   HCT 51.1 (H) 08/04/2017                         CA Markers No results found for: CEA, CA125, LABCA2                      Note: Lab results reviewed.  Recent Diagnostic Imaging Review  Cervical Imaging:  Cervical DG Bending/F/E views:  Results for orders placed during the hospital encounter of 01/02/18  DG Cervical Spine With Flex & Extend   Narrative CLINICAL DATA:  Chronic cervicalgia  EXAM: CERVICAL SPINE COMPLETE WITH FLEXION AND EXTENSION VIEWS  COMPARISON:  None.  FINDINGS: Frontal, neutral lateral, flexion lateral, extension lateral, open-mouth odontoid, and bilateral oblique views obtained. There is no evident fracture. There is no appreciable spondylolisthesis on neutral lateral imaging. There is no appreciable change in lateral alignment with flexion-extension.  The patient is status post anterior screw and plate fixation from G9-Q1 with disc spacers noted at C4-5, C5-6, and C6-7. Support hardware appears intact at all levels.  There is moderate disc space narrowing at C7-T1. No other disc space narrowing is appreciable. There is calcification in the anterior ligament at C3-4. There is mild facet hypertrophy on the right at C4-5 causing mild exit foraminal narrowing. No similar changes seen elsewhere in the cervical region. No erosive change. Lung apices  are clear.  There is relative lack of lordosis on the neutral lateral imaging. There is mild calcification in the left carotid artery.  IMPRESSION: Postoperative change from C4-C7 with support hardware intact. Moderate disc space narrowing C7-T1. Calcification anterior ligament at C3-4. Mild exit foraminal narrowing due to bony hypertrophy on the right at C4-5.  No fracture. No spondylolisthesis. No change in lateral alignment with flexion-extension. Patient's ability to flex and extend appears somewhat limited. Lack of lordosis on neutral lateral imaging suggests underlying muscle spasm.  Mild calcification  noted in left carotid artery.   Electronically Signed   By: Lowella Grip III M.D.   On: 01/02/2018 11:15      Thoracic DG 2-3 views:  Results for orders placed during the hospital encounter of 01/02/18  DG Thoracic Spine 2 View   Narrative CLINICAL DATA:  Chronic dorsalgia  EXAM: THORACIC SPINE 3 VIEWS  COMPARISON:  None.  FINDINGS: Frontal, lateral, and swimmer's views obtained. There is no fracture or spondylolisthesis. There is mild disc space narrowing at multiple levels in the upper and lower thoracic regions. No paraspinous lesion or erosion noted.  IMPRESSION: Osteoarthritic changes at several levels in the upper and lower thoracic region. Disc space narrowing is most notable at T9-10, T10-11, T11-12, and T12-L1. No erosive change. No fracture or spondylolisthesis.   Electronically Signed   By: Lowella Grip III M.D.   On: 01/02/2018 11:17    Lumbar DG (Complete) 4+V:  Results for orders placed during the hospital encounter of 03/21/16  DG Lumbar Spine Complete   Narrative CLINICAL DATA:  Lumbago without radicular symptoms  EXAM: LUMBAR SPINE - COMPLETE 4+ VIEW  COMPARISON:  None.  FINDINGS: Frontal, lateral, spot lumbosacral lateral, and bilateral oblique views were obtained. There are 5 non-rib-bearing lumbar type vertebral  bodies. There is no fracture or spondylolisthesis. There is mild disc space narrowing at all levels, most notably at L1-2 and L4-5. There are anterior osteophytes at L1, L2, and L3. There is facet arthropathic change at L5-S1 bilaterally. There are scattered foci of calcification in the aorta.  IMPRESSION: Osteoarthritic change at multiple levels. No fracture or spondylolisthesis.   Electronically Signed   By: Lowella Grip III M.D.   On: 03/21/2016 11:22    Lumbar DG Bending views:  Results for orders placed during the hospital encounter of 01/02/18  DG Lumbar Spine Complete W/Bend   Narrative CLINICAL DATA:  Chronic lumbago  EXAM: LUMBAR SPINE - COMPLETE WITH BENDING VIEWS  COMPARISON:  None.  FINDINGS: Frontal, weight-bearing neutral lateral, weight-bearing flexion lateral, weight-bearing extension lateral, spot lumbosacral lateral, and bilateral oblique views were obtained. The there are 5 non-rib-bearing lumbar type vertebral bodies. There is no fracture.  With neutral and extension lateral imaging, there is 7 mm of retrolisthesis of L1 on L2. On flexion lateral imaging, there is 4 mm of retrolisthesis of L1 on L2.  With neutral and extension lateral imaging, there is 6 mm of retrolisthesis of L2 on L3. With flexion lateral imaging, there is 4 mm of retrolisthesis of L2 on L3. There is 3 mm of retrolisthesis of L4 on L5 with neutral lateral, flexion lateral, and extension lateral imaging.  There is moderate disc space narrowing at L1-2. There is slightly milder disc space narrowing at L2-3 and L4-5. There is facet osteoarthritic change at L4-5 and L5-S1 bilaterally. There is atherosclerotic calcification in the aorta. There is a 3 mm calcification in the left kidney.  IMPRESSION: No fracture. Areas of spondylolisthesis as summarized above with areas of change in lateral alignment between neutral lateral, flexion lateral, extension lateral positioning as  noted. Areas of osteoarthritic change, most notably at L1-2 and L2-3.  There is aortic atherosclerosis.  3 mm left renal calculus noted.  Aortic Atherosclerosis (ICD10-I70.0).   Electronically Signed   By: Lowella Grip III M.D.   On: 01/02/2018 11:32     Complexity Note: Imaging results reviewed. Results shared with Mr. Radell, using Layman's terms.  Meds   Current Outpatient Medications:  .  ACCU-CHEK SOFTCLIX LANCETS lancets, USE AS DIRECTED, Disp: 100 each, Rfl: 0 .  allopurinol (ZYLOPRIM) 100 MG tablet, Take 100 mg by mouth every morning. , Disp: , Rfl:  .  amitriptyline (ELAVIL) 25 MG tablet, Take 2 tablets (50 mg total) by mouth at bedtime., Disp: 30 tablet, Rfl: 5 .  atenolol (TENORMIN) 50 MG tablet, Take 1 tablet (50 mg total) by mouth daily., Disp: 90 tablet, Rfl: 3 .  belladonna-PHENObarbital (DONNATAL) 16.2 MG/5ML ELIX, Take 5 mLs (16.2 mg total) by mouth 4 (four) times daily as needed for cramping or pain. (Patient taking differently: Take 5 mLs by mouth 4 (four) times daily as needed for cramping or pain. ), Disp: 600 mL, Rfl: 1 .  cetirizine (ZYRTEC) 10 MG tablet, Take 10 mg by mouth at bedtime. , Disp: , Rfl:  .  colchicine 0.6 MG tablet, Take 0.6 mg by mouth daily as needed., Disp: , Rfl:  .  Dapagliflozin-metFORMIN HCl ER (XIGDUO XR) 04-999 MG TB24, Take by mouth., Disp: , Rfl:  .  diphenhydrAMINE (BENADRYL) 25 mg capsule, Take 25 mg by mouth at bedtime. , Disp: , Rfl:  .  EPINEPHrine (EPIPEN 2-PAK) 0.3 mg/0.3 mL IJ SOAJ injection, Inject 0.3 mg into the muscle once., Disp: , Rfl:  .  glucose blood (ACCU-CHEK ACTIVE STRIPS) test strip, Use as instructed, Disp: 100 each, Rfl: 12 .  glucose blood (ACCU-CHEK AVIVA PLUS) test strip, Check FSBS three to four times per day, Disp: 100 each, Rfl: 1 .  hydrochlorothiazide (HYDRODIURIL) 12.5 MG tablet, Take 12.5 mg by mouth daily., Disp: , Rfl:  .  Insulin Degludec (TRESIBA FLEXTOUCH) 100 UNIT/ML  SOPN, Inject 20 Units into the skin at bedtime. , Disp: 1 pen, Rfl: 0 .  Insulin Pen Needle (B-D UF III MINI PEN NEEDLES) 31G X 5 MM MISC, Once a day with insulin, Disp: 50 each, Rfl: 3 .  lidocaine (LIDODERM) 5 %, Place 1 patch onto the skin every 12 (twelve) hours. Remove & Discard patch within 12 hours or as directed by MD, Disp: 10 patch, Rfl: 0 .  Melatonin 5 MG TABS, Take 5 mg by mouth at bedtime. , Disp: , Rfl:  .  metFORMIN (GLUCOPHAGE-XR) 500 MG 24 hr tablet, Take 1,500 mg by mouth every evening. Take with supper., Disp: , Rfl:  .  ranitidine (ZANTAC) 150 MG capsule, Take 150 mg by mouth at bedtime. , Disp: , Rfl:  .  tiZANidine (ZANAFLEX) 4 MG tablet, Take 1 tablet (4 mg total) by mouth every 8 (eight) hours as needed for muscle spasms., Disp: 90 tablet, Rfl: 5 .  celecoxib (CELEBREX) 200 MG capsule, Take 1 capsule (200 mg total) by mouth daily., Disp: 30 capsule, Rfl: 2 .  HYDROcodone-acetaminophen (NORCO/VICODIN) 5-325 MG tablet, Take 1 tablet by mouth 2 (two) times daily as needed for up to 14 days for moderate pain., Disp: 30 tablet, Rfl: 0 .  metFORMIN (GLUCOPHAGE-XR) 500 MG 24 hr tablet, Take 3 tablets by mouth daily., Disp: , Rfl: 3 .  oxyCODONE-acetaminophen (PERCOCET) 5-325 MG tablet, Take 1 tablet by mouth every 4 (four) hours as needed for severe pain. (Patient not taking: Reported on 07/17/2018), Disp: 6 tablet, Rfl: 0  ROS  Constitutional: Denies any fever or chills Gastrointestinal: No reported hemesis, hematochezia, vomiting, or acute GI distress Musculoskeletal: Denies any acute onset joint swelling, redness, loss of ROM, or weakness Neurological: No reported episodes of acute onset apraxia, aphasia, dysarthria,  agnosia, amnesia, paralysis, loss of coordination, or loss of consciousness  Allergies  Mr. Ander is allergic to lisinopril and ace inhibitors.  PFSH  Drug: Mr. Langlinais  reports that he does not use drugs. Alcohol:  reports that he does not drink  alcohol. Tobacco:  reports that he quit smoking about 3 years ago. His smoking use included cigarettes. He has a 10.00 pack-year smoking history. He has never used smokeless tobacco. Medical:  has a past medical history of Angioedema, Bronchitis, DDD (degenerative disc disease), cervical, Diabetes mellitus ([place]), Fatty liver (03/31/2017), GERD (gastroesophageal reflux disease), Gout, HTN (hypertension), Hypertension, IBS (irritable bowel syndrome), Seasonal allergies, and Spinal stenosis. Surgical: Mr. Settlemire  has a past surgical history that includes Shoulder surgery (Left, 2007); Nasal septum surgery (2004); ACDF with fusion (2007); Testicular torsion (1980s); Vasectomy; Colonoscopy with propofol (N/A, 06/26/2015); and Cardiac catheterization (Left, 08/15/2016). Family: family history includes Allergies in his son; Alzheimer's disease in his maternal grandmother; Arrhythmia in his mother; Breast cancer in his mother; Cancer in his maternal grandfather and mother; Diabetes in his father; Heart disease in his brother; Hypertension in his father; Skin cancer in his mother.  Constitutional Exam  General appearance: Well nourished, well developed, and well hydrated. In no apparent acute distress Vitals:   07/17/18 1358  BP: (!) 127/92  Pulse: (!) 59  Resp: 18  Temp: 98.5 F (36.9 C)  SpO2: 100%  Weight: 240 lb (108.9 kg)  Height: _0  (1.803 m)   BMI Assessment: Estimated body mass index is 33.47 kg/m as calculated from the following:   Height as of this encounter: _1  (1.803 m).   Weight as of this encounter: 240 lb (108.9 kg).  BMI interpretation table: BMI level Category Range association with higher incidence of chronic pain  <18 kg/m2 Underweight   18.5-24.9 kg/m2 Ideal body weight   25-29.9 kg/m2 Overweight Increased incidence by 20%  30-34.9 kg/m2 Obese (Class I) Increased incidence by 68%  35-39.9 kg/m2 Severe obesity (Class II) Increased incidence by 136%  >40 kg/m2 Extreme  obesity (Class III) Increased incidence by 254%   Patient's current BMI Ideal Body weight  Body mass index is 33.47 kg/m. Ideal body weight: 75.3 kg (166 lb 0.1 oz) Adjusted ideal body weight: 88.7 kg (195 lb 9.7 oz)   BMI Readings from Last 4 Encounters:  07/17/18 33.47 kg/m  06/25/18 31.87 kg/m  06/24/18 32.55 kg/m  06/12/18 32.55 kg/m   Wt Readings from Last 4 Encounters:  07/17/18 240 lb (108.9 kg)  06/25/18 235 lb (106.6 kg)  06/24/18 240 lb (108.9 kg)  06/12/18 240 lb (108.9 kg)  Psych/Mental status: Alert, oriented x 3 (person, place, & time)       Eyes: PERLA Respiratory: No evidence of acute respiratory distress  Cervical Spine Area Exam  Skin & Axial Inspection: No masses, redness, edema, swelling, or associated skin lesions Alignment: Symmetrical Functional ROM: Unrestricted ROM      Stability: No instability detected Muscle Tone/Strength: Functionally intact. No obvious neuro-muscular anomalies detected. Sensory (Neurological): Unimpaired Palpation: No palpable anomalies              Upper Extremity (UE) Exam    Side: Right upper extremity  Side: Left upper extremity  Skin & Extremity Inspection: Skin color, temperature, and hair growth are WNL. No peripheral edema or cyanosis. No masses, redness, swelling, asymmetry, or associated skin lesions. No contractures.  Skin & Extremity Inspection: Skin color, temperature, and hair growth are WNL. No peripheral edema or  cyanosis. No masses, redness, swelling, asymmetry, or associated skin lesions. No contractures.  Functional ROM: Unrestricted ROM          Functional ROM: Unrestricted ROM          Muscle Tone/Strength: Functionally intact. No obvious neuro-muscular anomalies detected.  Muscle Tone/Strength: Functionally intact. No obvious neuro-muscular anomalies detected.  Sensory (Neurological): Unimpaired          Sensory (Neurological): Unimpaired          Palpation: No palpable anomalies              Palpation: No  palpable anomalies              Provocative Test(s):  Phalen's test: deferred Tinel's test: deferred Apley's scratch test (touch opposite shoulder):  Action 1 (Across chest): deferred Action 2 (Overhead): deferred Action 3 (LB reach): deferred   Provocative Test(s):  Phalen's test: deferred Tinel's test: deferred Apley's scratch test (touch opposite shoulder):  Action 1 (Across chest): deferred Action 2 (Overhead): deferred Action 3 (LB reach): deferred    Thoracic Spine Area Exam  Skin & Axial Inspection: No masses, redness, or swelling Alignment: Symmetrical Functional ROM: Unrestricted ROM Stability: No instability detected Muscle Tone/Strength: Functionally intact. No obvious neuro-muscular anomalies detected. Sensory (Neurological): Unimpaired Muscle strength & Tone: No palpable anomalies  Lumbar Spine Area Exam  Skin & Axial Inspection: No masses, redness, or swelling Alignment: Symmetrical Functional ROM: Decreased ROM       Stability: No instability detected Muscle Tone/Strength: Functionally intact. No obvious neuro-muscular anomalies detected. Sensory (Neurological): Movement-associated pain Palpation: Complains of area being tender to palpation       Provocative Tests: Lumbar Hyperextension/rotation test: (+) bilaterally for facet joint pain. Lumbar quadrant test (Kemp's test): deferred today       Lumbar Lateral bending test: deferred today       Patrick's Maneuver: (+) for left-sided S-I arthralgia             FABER test: deferred today                   Thigh-thrust test: deferred today       S-I compression test: deferred today       S-I distraction test: deferred today        Gait & Posture Assessment  Ambulation: Unassisted Gait: Relatively normal for age and body habitus Posture: WNL   Lower Extremity Exam    Side: Right lower extremity  Side: Left lower extremity  Stability: No instability observed          Stability: No instability observed           Skin & Extremity Inspection: Skin color, temperature, and hair growth are WNL. No peripheral edema or cyanosis. No masses, redness, swelling, asymmetry, or associated skin lesions. No contractures.  Skin & Extremity Inspection: Skin color, temperature, and hair growth are WNL. No peripheral edema or cyanosis. No masses, redness, swelling, asymmetry, or associated skin lesions. No contractures.  Functional ROM: Unrestricted ROM                  Functional ROM: Unrestricted ROM                  Muscle Tone/Strength: Functionally intact. No obvious neuro-muscular anomalies detected.  Muscle Tone/Strength: Functionally intact. No obvious neuro-muscular anomalies detected.  Sensory (Neurological): Unimpaired  Sensory (Neurological): Unimpaired  Palpation: No palpable anomalies  Palpation: No palpable anomalies   Assessment  Primary Diagnosis &  Pertinent Problem List: The primary encounter diagnosis was Lumbar facet arthropathy. Diagnoses of Lumbar radiculopathy, Chronic bilateral low back pain without sciatica, Degenerative disc disease, lumbar, S/P cervical spinal fusion, Type 2 diabetes mellitus without complication, with long-term current use of insulin ([place]), and Hip pain, acute, left were also pertinent to this visit.  Status Diagnosis  Having a Flare-up Having a Flare-up Worsening 1. Lumbar facet arthropathy   2. Lumbar radiculopathy   3. Chronic bilateral low back pain without sciatica   4. Degenerative disc disease, lumbar   5. S/P cervical spinal fusion   6. Type 2 diabetes mellitus without complication, with long-term current use of insulin ([place])   7. Hip pain, acute, left      General Recommendations: The pain condition that the patient suffers from is best treated with a multidisciplinary approach that involves an increase in physical activity to prevent de-conditioning and worsening of the pain cycle, as well as psychological counseling (formal and/or informal) to address the  co-morbid psychological affects of pain. Treatment will often involve judicious use of pain medications and interventional procedures to decrease the pain, allowing the patient to participate in the physical activity that will ultimately produce long-lasting pain reductions. The goal of the multidisciplinary approach is to return the patient to a higher level of overall function and to restore their ability to perform activities of daily living.  47 year old male with a history of cervical spine fusion along with lumbar facet arthropathy and lumbar spondylosis status post right and left lumbar radiofrequency ablation completed in November 2018.  The patient has been having worsening axial low back pain since the end of June.  No inciting or traumatic events.  Patient states that he has been working outside more often but does not recall any injuries or obvious movements that led to an increase in his pain.  Denies any bowel or bladder dysfunction.  Is now having new onset left hip pain with weightbearing.  Patient is also endorsing amplified numbness and tingling down his left thigh.  No obvious weakness or motor deficits noted on exam however patient does have significant pain with flexion and extension of his hip.  Patient has tried conservative therapy with Medrol Dosepak, intramuscular Toradol and Norflex which were not effective.  To evaluate for worsening radiculopathy in the context of worsening paresthesias, recommend lumbar MRI without contrast.  In regards to his hip pain related to weightbearing, recommend hip and left SI joint x-rays.  I recommended the patient discontinue his diclofenac.  We will trial another NSAID: Celebrex 200 mg daily.  We will also increase his amitriptyline to 50 mg nightly. I will also prescribe the patient short supply of hydrocodone to take when he has very severe breakthrough pain.  This may limit the patient from going to the emergency department if he does have an  acute increase in his pain.  Plan: -Short supply of hydrocodone as below for acute pain flare -Lumbar MRI without contrast to evaluate sx of worsening lumbar radiculopathy -Left hip and left SI joint x-ray to evaluate for hip pathology -Stop diclofenac.  Start Celebrex 200 mg daily -Upon follow-up can discuss lumbar ESI versus repeating left lumbar radio frequency ablation pending MRI results -Continue all other medications as prescribed  Plan of Care  Pharmacotherapy (Medications Ordered): Meds ordered this encounter  Medications  . HYDROcodone-acetaminophen (NORCO/VICODIN) 5-325 MG tablet    Sig: Take 1 tablet by mouth 2 (two) times daily as needed for up to 14 days for  moderate pain.    Dispense:  30 tablet    Refill:  0    Do not place this medication, or any other prescription from our practice, on "Automatic Refill". Patient may have prescription filled one day early if pharmacy is closed on scheduled refill date. Do not fill until:  To last until:  . celecoxib (CELEBREX) 200 MG capsule    Sig: Take 1 capsule (200 mg total) by mouth daily.    Dispense:  30 capsule    Refill:  2  . amitriptyline (ELAVIL) 25 MG tablet    Sig: Take 2 tablets (50 mg total) by mouth at bedtime.    Dispense:  30 tablet    Refill:  5   Lab-work, procedure(s), and/or referral(s): Orders Placed This Encounter  Procedures  . MR LUMBAR SPINE WO CONTRAST  . DG Si Joints  . DG HIP UNILAT W OR W/O PELVIS 2-3 VIEWS LEFT   Time Note: Greater than 50% of the 25 minute(s) of face-to-face time spent with Mr. Showers, was spent in counseling/coordination of care regarding: Mr. Labrada primary cause of pain, the treatment plan, treatment alternatives, the risks and possible complications of proposed treatment, medication side effects, going over the informed consent, the opioid analgesic risks and possible complications, the appropriate use of his medications, realistic expectations and the goals of pain  management (increased in functionality).  Provider-requested follow-up: Return in about 3 weeks (around 08/07/2018) for Medication Management, After Imaging.  Future Appointments  Date Time Provider [place]  08/08/2018 11:00 AM Arnetha Courser, MD [facility]  08/09/2018  8:45 AM Gillis Santa, MD [facility] None    Primary Care Physician: Arnetha Courser, MD Location: [facility] Note by: Gillis Santa, M.D Date: 07/17/2018; Time: 3:33 PM  There are no Patient Instructions on file for this visit.

## 2018-07-17 NOTE — Progress Notes (Signed)
Safety precautions to be maintained throughout the outpatient stay will include: orient to surroundings, keep bed in low position, maintain call bell within reach at all times, provide assistance with transfer out of bed and ambulation.  

## 2018-07-18 ENCOUNTER — Telehealth: Payer: Self-pay | Admitting: *Deleted

## 2018-07-24 ENCOUNTER — Telehealth: Payer: Self-pay | Admitting: Student in an Organized Health Care Education/Training Program

## 2018-07-24 NOTE — Telephone Encounter (Signed)
Spoke with patient re; Crystal's reply.  Conveyed that he can take additional tablet today and could alternate with ibuprofen if celebrex is not working but not to mix the two.  Patient verbalizes u/o information.

## 2018-07-24 NOTE — Telephone Encounter (Signed)
Patient is having lower left hip pain.  Xray has come back normal.  I told patient that I would have to tell him to take his medicine as prescribed.  Is taking the xanaflex.  Pain is constant. Was just switched from diclofenac to celebrex 200 mg. States this medicine is not working.

## 2018-07-24 NOTE — Telephone Encounter (Signed)
Patient  Called stating he is in a lot of pain and wanted to know if he could take a 2nd Norco today. Having a really bad flare up.

## 2018-07-24 NOTE — Telephone Encounter (Signed)
According to Dr Holley Raring note the Norco was for limited supply for flare ups only. Yes he may take another today but he does not need to keep calling about flare ups. He needs to be proactive and use [employer] if the above NSAIDs are not working, 30 minutes prior to working etc. He does not need to be mixing them. Thanks

## 2018-08-01 ENCOUNTER — Ambulatory Visit
Admission: RE | Admit: 2018-08-01 | Discharge: 2018-08-01 | Disposition: A | Discharge status: Home / Self Care | Payer: 59 | Source: Ambulatory Visit | Attending: Student in an Organized Health Care Education/Training Program | Admitting: Student in an Organized Health Care Education/Training Program

## 2018-08-01 DIAGNOSIS — M5416 Radiculopathy, lumbar region: Secondary | ICD-10-CM

## 2018-08-01 DIAGNOSIS — M5116 Intervertebral disc disorders with radiculopathy, lumbar region: Secondary | ICD-10-CM | POA: Diagnosis not present

## 2018-08-01 DIAGNOSIS — M48061 Spinal stenosis, lumbar region without neurogenic claudication: Secondary | ICD-10-CM | POA: Diagnosis not present

## 2018-08-02 NOTE — Treatment Plan (Signed)
Call patient this morning and reviewed MRI results with him.  Patient continued to have left-sided pain which is slightly better with Celebrex.  Patient has appointment with me next week.  Pain symptoms still significant we discussed left-sided L3/4 epidural steroid injection and/or possible neurosurgical consult.

## 2018-08-08 ENCOUNTER — Encounter: Payer: 59 | Admitting: Family Medicine

## 2018-08-09 ENCOUNTER — Encounter: Payer: Self-pay | Admitting: Student in an Organized Health Care Education/Training Program

## 2018-08-09 ENCOUNTER — Ambulatory Visit
Payer: 59 | Attending: Student in an Organized Health Care Education/Training Program | Admitting: Student in an Organized Health Care Education/Training Program

## 2018-08-09 ENCOUNTER — Other Ambulatory Visit: Payer: Self-pay

## 2018-08-09 VITALS — BP 157/121 | HR 73 | Temp 98.6°F | Resp 16 | Ht 71.0 in | Wt 235.0 lb

## 2018-08-09 DIAGNOSIS — G894 Chronic pain syndrome: Secondary | ICD-10-CM | POA: Diagnosis not present

## 2018-08-09 DIAGNOSIS — M5136 Other intervertebral disc degeneration, lumbar region: Secondary | ICD-10-CM | POA: Insufficient documentation

## 2018-08-09 DIAGNOSIS — M48061 Spinal stenosis, lumbar region without neurogenic claudication: Secondary | ICD-10-CM | POA: Insufficient documentation

## 2018-08-09 DIAGNOSIS — M25552 Pain in left hip: Secondary | ICD-10-CM | POA: Diagnosis not present

## 2018-08-09 DIAGNOSIS — E119 Type 2 diabetes mellitus without complications: Secondary | ICD-10-CM

## 2018-08-09 DIAGNOSIS — M545 Low back pain: Secondary | ICD-10-CM | POA: Diagnosis present

## 2018-08-09 DIAGNOSIS — G8929 Other chronic pain: Secondary | ICD-10-CM | POA: Diagnosis present

## 2018-08-09 DIAGNOSIS — M5416 Radiculopathy, lumbar region: Secondary | ICD-10-CM

## 2018-08-09 DIAGNOSIS — Z794 Long term (current) use of insulin: Secondary | ICD-10-CM | POA: Diagnosis not present

## 2018-08-09 DIAGNOSIS — M48062 Spinal stenosis, lumbar region with neurogenic claudication: Secondary | ICD-10-CM | POA: Diagnosis not present

## 2018-08-09 DIAGNOSIS — M5126 Other intervertebral disc displacement, lumbar region: Secondary | ICD-10-CM | POA: Insufficient documentation

## 2018-08-09 DIAGNOSIS — Z981 Arthrodesis status: Secondary | ICD-10-CM | POA: Diagnosis not present

## 2018-08-09 DIAGNOSIS — M51369 Other intervertebral disc degeneration, lumbar region without mention of lumbar back pain or lower extremity pain: Secondary | ICD-10-CM

## 2018-08-09 MED ORDER — OXYCODONE-ACETAMINOPHEN 7.5-325 MG PO TABS: 1.0000 | ORAL_TABLET | Freq: Three times a day (TID) | ORAL | 0 refills | Status: DC | PRN

## 2018-08-09 MED ORDER — PREGABALIN 50 MG PO CAPS: 50.0000 mg | ORAL_CAPSULE | Freq: Three times a day (TID) | ORAL | 1 refills | Status: DC

## 2018-08-09 NOTE — Progress Notes (Signed)
Patient's Name: John Hartman  MRN: [id number]  Referring Provider: Arnetha Courser, MD  DOB: 1971/06/12  PCP: Arnetha Courser, MD  DOS: 08/09/2018  Note by: Gillis Santa, MD  Service setting: Ambulatory outpatient  Specialty: Interventional Pain Management  Location: [facility] (AMB) Pain Management Facility    Patient type: Established   Primary Reason(s) for Visit: Encounter for prescription drug management. (Level of risk: moderate)  CC: Back Pain (lower)  HPI  John Hartman is a 47 y.o. year old, male patient, who comes today for a medication management evaluation. He has Essential hypertension; Gout; IBS (irritable bowel syndrome); Family history of malignant neoplasm of gastrointestinal tract; Benign neoplasm of rectosigmoid junction; Palpitations; Decongestant abuse; Ketonuria; Glucosuria; Preventative health care; Chest pain; Diabetes mellitus ([place]); Encounter for medication monitoring; Coronary artery disease; Angioedema; Hyperlipidemia LDL goal <70; Elevated serum glutamic pyruvic transaminase (SGPT) level; Elevated liver enzymes; Serum total bilirubin elevated; Fatty liver; Chronic lower back pain; Degenerative disc disease, lumbar; Arthritis, lumbar spine; Calcification of abdominal aorta (HCC); and Elevated hemoglobin (HCC) on their problem list. His primarily concern today is the Back Pain (lower)  Pain Assessment: Location: Lower Back Radiating: down left leg Onset: More than a month ago Duration: Chronic pain Quality: Aching, Dull, Constant Severity: 8 /10 (subjective, self-reported pain score)  Note: Reported level is inconsistent with clinical observations. Clinically the patient looks like a 4/10 A 4/10 is viewed as "Moderately Severe" and described as impossible to ignore for more than a few minutes. With effort, patients may still be able to manage work or participate in some social activities. Very difficult to concentrate. Signs of autonomic nervous system discharge are evident: dilated  pupils (mydriasis); mild sweating (diaphoresis); sleep interference. Heart rate becomes elevated (>115 bpm). Diastolic blood pressure (lower number) rises above 100 mmHg. Patients find relief in laying down and not moving.       When using our objective Pain Scale, levels between 6 and 10/10 are said to belong in an emergency room, as it progressively worsens from a 6/10, described as severely limiting, requiring emergency care not usually available at an outpatient pain management facility. At a 6/10 level, communication becomes difficult and requires great effort. Assistance to reach the emergency department may be required. Facial flushing and profuse sweating along with potentially dangerous increases in heart rate and blood pressure will be evident. Effect on ADL: limits activities Timing:   Modifying factors: denies BP: (!) 157/121  HR: 73  John Hartman was last scheduled for an appointment on 07/24/2018 for medication management. During today's appointment we reviewed John Hartman chronic pain status, as well as his outpatient medication regimen.  Patient follows up today to review his lumbar MRI results.  Continues to have severe axial low back pain left greater than right that radiates down to his left hip and down his left thigh.  He finds it very debilitating and disabling.  He has an appointment with his previous surgeon for next Thursday.  Patient states that the hydrocodone is not helping.    The patient  reports that he does not use drugs. His body mass index is 32.78 kg/m.  Further details on both, my assessment(s), as well as the proposed treatment plan, please see below.  Controlled Substance Pharmacotherapy Assessment REMS (Risk Evaluation and Mitigation Strategy)  Analgesic: Hydrocodone 5 mg twice daily as needed MME/day: 10 mg/day.  Rise Patience, RN  08/09/2018  8:59 AM  Signed Safety precautions to be maintained throughout the outpatient stay will include:  orient to  surroundings, keep bed in low position, maintain call bell within reach at all times, provide assistance with transfer out of bed and ambulation.    Pharmacokinetics: Liberation and absorption (onset of action): WNL Distribution (time to peak effect): WNL Metabolism and excretion (duration of action): WNL         Pharmacodynamics: Desired effects: Analgesia: John Hartman reports <50% benefit. Functional ability: Patient reports being unable to accomplish basic ADLs Clinically meaningful improvement in function (CMIF): Sustained CMIF goals met Perceived effectiveness: Described as ineffective and would like to make some changes Undesirable effects: Side-effects or Adverse reactions: None reported Monitoring: [place] PMP: Online review of the past 75-monthperiod conducted. Compliant with practice rules and regulations Last UDS on record: No results found for: SUMMARY UDS interpretation: Compliant          Medication Assessment Form: Reviewed. Patient indicates being compliant with therapy Treatment compliance: Compliant Risk Assessment Profile: Aberrant behavior: See prior evaluations. None observed or detected today Comorbid factors increasing risk of overdose: See prior notes. No additional risks detected today Opioid risk tool (ORT) (Total Score): 0 Personal History of Substance Abuse (SUD-Substance use disorder):  Alcohol: Negative  Illegal Drugs: Negative  Rx Drugs: Negative  ORT Risk Level calculation: Low Risk Risk of substance use disorder (SUD): Low Opioid Risk Tool - 08/09/18 0859      Family History of Substance Abuse   Alcohol  Negative    Illegal Drugs  Negative    Rx Drugs  Negative      Personal History of Substance Abuse   Alcohol  Negative    Illegal Drugs  Negative    Rx Drugs  Negative      Age   Age between 132-45years   No      History of Preadolescent Sexual Abuse   History of Preadolescent Sexual Abuse  Negative or Male      Psychological Disease    Psychological Disease  Negative    Depression  Negative      Total Score   Opioid Risk Tool Scoring  0    Opioid Risk Interpretation  Low Risk      ORT Scoring interpretation table:  Score <3 = Low Risk for SUD  Score between 4-7 = Moderate Risk for SUD  Score >8 = High Risk for Opioid Abuse   Risk Mitigation Strategies:  Patient Counseling: Covered Patient-Prescriber Agreement (PPA): Present and active  Notification to other healthcare providers: Done  Pharmacologic Plan: Change to Percocet 7.5 mg 3 times daily as needed for severe pain.  We will also add Lyrica.  Instructed patient to discontinue amitriptyline and Celebrex.             Laboratory Chemistry  Inflammation Markers (CRP: Acute Phase) (ESR: Chronic Phase) No results found for: CRP, ESRSEDRATE, LATICACIDVEN                       Rheumatology Markers Lab Results  Component Value Date   RF <14 08/04/2017   ANA Negative 09/21/2017   LABURIC 6.4 08/04/2017                        Renal Function Markers Lab Results  Component Value Date   BUN 19 08/04/2017   CREATININE 1.20 08/04/2017   GFRAA 85 08/04/2017   GFRNONAA 73 08/04/2017  Hepatic Function Markers Lab Results  Component Value Date   AST 113 (H) 02/16/2018   ALT 155 (H) 02/16/2018   ALBUMIN 4.5 02/16/2018   ALKPHOS 97 02/16/2018                        Electrolytes Lab Results  Component Value Date   NA 139 08/04/2017   K 4.8 08/04/2017   CL 101 08/04/2017   CALCIUM 10.0 08/04/2017   MG 2.0 08/27/2016   PHOS 1.9 (L) 08/27/2016                        Neuropathy Markers Lab Results  Component Value Date   HGBA1C  07/08/2016    UNABLE TO REPORT A1C DUE TO UNKNOWN INTERFERING FACTOR                        Bone Pathology Markers Lab Results  Component Value Date   VD25OH 30 08/04/2017                         Coagulation Parameters Lab Results  Component Value Date   INR 0.96 08/11/2016   LABPROT 12.8  08/11/2016   PLT 195 08/04/2017                        Cardiovascular Markers Lab Results  Component Value Date   TROPONINI <0.03 07/08/2016   HGB 17.2 (H) 08/04/2017   HCT 51.1 (H) 08/04/2017                         CA Markers No results found for: CEA, CA125, LABCA2                      Note: Lab results reviewed.  Recent Diagnostic Imaging Results  MR LUMBAR SPINE WO CONTRAST CLINICAL DATA:  Chronic low back pain, worse last month. Lumbar radiculopathy  EXAM: MRI LUMBAR SPINE WITHOUT CONTRAST  TECHNIQUE: Multiplanar, multisequence MR imaging of the lumbar spine was performed. No intravenous contrast was administered.  COMPARISON:  Radiography 01/02/2018 5 lumbar type vertebral bodies  FINDINGS: Segmentation:  5 lumbar type vertebral bodies  Alignment: Reversal of normal lumbar lordosis at L1-2. Slight retrolisthesis at L1-2, L2-3, and L4-5.  Vertebrae: Degenerative endplate marrow alteration at L1-2. No fracture, discitis, or aggressive bone lesion  Conus medullaris and cauda equina: Conus extends to the L1 level. Conus and cauda equina appear normal.  Paraspinal and other soft tissues: Negative  Disc levels:  T12- L1: Unremarkable.  L1-L2: Spondylosis and disc narrowing with endplate degeneration. Central disc protrusion at an annular fissure with ventral thecal sac flattening. Spinal stenosis is mild.  L2-L3: Spondylosis and disc narrowing with bulge and annular fissure. Ventral thecal sac flattening. Spinal stenosis is mild.  L3-L4: Disc narrowing and mild bulging. Bilateral foraminal to extra-foraminal protrusion, larger on the left, which could irritate the extraforaminal L3 nerve roots. Mild spinal stenosis with ventral thecal sac flattening  L4-L5: Disc narrowing and bulging with a broad central down turning protrusion. Spinal stenosis is moderate. Mild to moderate left foraminal narrowing.  L5-S1:Mild facet spurring and disc bulging.  Posterior annular fissures.  IMPRESSION: 1. Multilevel degenerative disc bulge and protrusion as described. 2. Spinal stenosis is moderate at L4-5 and mild at L1-2, L2-3, and L3-4. 3. L3-4 left larger than  right extraforaminal disc protrusion which could irritate the bilateral L3 nerve roots. 4. Mild-to-moderate L4-5 left foraminal narrowing.  Electronically Signed   By: Monte Fantasia M.D.   On: 08/01/2018 16:01  Complexity Note: Imaging results reviewed. Results shared with John Hartman, using Layman's terms. Today I personally and independently reviewed the study images pertinent to John Hartman's problem. Results made available to patient. Images reviewed using the PACS system hyperlink. I have personally examined the images and I agree with the reported  findings. I find no additional pain-related pathology to add to the report.  Meds   Current Outpatient Medications:  .  ACCU-CHEK SOFTCLIX LANCETS lancets, USE AS DIRECTED, Disp: 100 each, Rfl: 0 .  allopurinol (ZYLOPRIM) 100 MG tablet, Take 100 mg by mouth every morning. , Disp: , Rfl:  .  atenolol (TENORMIN) 50 MG tablet, Take 1 tablet (50 mg total) by mouth daily., Disp: 90 tablet, Rfl: 3 .  cetirizine (ZYRTEC) 10 MG tablet, Take 10 mg by mouth at bedtime. , Disp: , Rfl:  .  colchicine 0.6 MG tablet, Take 0.6 mg by mouth daily as needed., Disp: , Rfl:  .  Dapagliflozin-metFORMIN HCl ER (XIGDUO XR) 04-999 MG TB24, Take by mouth., Disp: , Rfl:  .  diphenhydrAMINE (BENADRYL) 25 mg capsule, Take 25 mg by mouth at bedtime. , Disp: , Rfl:  .  EPINEPHrine (EPIPEN 2-PAK) 0.3 mg/0.3 mL IJ SOAJ injection, Inject 0.3 mg into the muscle once., Disp: , Rfl:  .  glucose blood (ACCU-CHEK ACTIVE STRIPS) test strip, Use as instructed, Disp: 100 each, Rfl: 12 .  glucose blood (ACCU-CHEK AVIVA PLUS) test strip, Check FSBS three to four times per day, Disp: 100 each, Rfl: 1 .  hydrochlorothiazide (HYDRODIURIL) 12.5 MG tablet, Take 12.5 mg by mouth  daily., Disp: , Rfl:  .  Insulin Degludec (TRESIBA FLEXTOUCH) 100 UNIT/ML SOPN, Inject 20 Units into the skin at bedtime. , Disp: 1 pen, Rfl: 0 .  Insulin Pen Needle (B-D UF III MINI PEN NEEDLES) 31G X 5 MM MISC, Once a day with insulin, Disp: 50 each, Rfl: 3 .  lidocaine (LIDODERM) 5 %, Place 1 patch onto the skin every 12 (twelve) hours. Remove & Discard patch within 12 hours or as directed by MD, Disp: 10 patch, Rfl: 0 .  Melatonin 5 MG TABS, Take 5 mg by mouth at bedtime. , Disp: , Rfl:  .  metFORMIN (GLUCOPHAGE-XR) 500 MG 24 hr tablet, Take 3 tablets by mouth daily., Disp: , Rfl: 3 .  ranitidine (ZANTAC) 150 MG capsule, Take 150 mg by mouth at bedtime. , Disp: , Rfl:  .  tiZANidine (ZANAFLEX) 4 MG tablet, Take 1 tablet (4 mg total) by mouth every 8 (eight) hours as needed for muscle spasms., Disp: 90 tablet, Rfl: 5 .  metFORMIN (GLUCOPHAGE-XR) 500 MG 24 hr tablet, Take 1,500 mg by mouth every evening. Take with supper., Disp: , Rfl:  .  oxyCODONE-acetaminophen (PERCOCET) 7.5-325 MG tablet, Take 1 tablet by mouth 3 (three) times daily as needed for severe pain., Disp: 90 tablet, Rfl: 0 .  pregabalin (LYRICA) 50 MG capsule, Take 1 capsule (50 mg total) by mouth 3 (three) times daily., Disp: 90 capsule, Rfl: 1  ROS  Constitutional: Denies any fever or chills Gastrointestinal: No reported hemesis, hematochezia, vomiting, or acute GI distress Musculoskeletal: Denies any acute onset joint swelling, redness, loss of ROM, or weakness Neurological: No reported episodes of acute onset apraxia, aphasia, dysarthria, agnosia, amnesia, paralysis, loss of coordination,  or loss of consciousness  Allergies  John Hartman is allergic to lisinopril and ace inhibitors.  PFSH  Drug: John Hartman  reports that he does not use drugs. Alcohol:  reports that he does not drink alcohol. Tobacco:  reports that he quit smoking about 3 years ago. His smoking use included cigarettes. He has a 10.00 pack-year smoking history.  He has never used smokeless tobacco. Medical:  has a past medical history of Angioedema, Bronchitis, DDD (degenerative disc disease), cervical, Diabetes mellitus ([place]), Fatty liver (03/31/2017), GERD (gastroesophageal reflux disease), Gout, HTN (hypertension), Hypertension, IBS (irritable bowel syndrome), Seasonal allergies, and Spinal stenosis. Surgical: John Hartman  has a past surgical history that includes Shoulder surgery (Left, 2007); Nasal septum surgery (2004); ACDF with fusion (2007); Testicular torsion (1980s); Vasectomy; Colonoscopy with propofol (N/A, 06/26/2015); and Cardiac catheterization (Left, 08/15/2016). Family: family history includes Allergies in his son; Alzheimer's disease in his maternal grandmother; Arrhythmia in his mother; Breast cancer in his mother; Cancer in his maternal grandfather and mother; Diabetes in his father; Heart disease in his brother; Hypertension in his father; Skin cancer in his mother.  Constitutional Exam  General appearance: Well nourished, well developed, and well hydrated. In no apparent acute distress Vitals:   08/09/18 0850  BP: (!) 157/121  Pulse: 73  Resp: 16  Temp: 98.6 F (37 C)  TempSrc: Oral  SpO2: 100%  Weight: 235 lb (106.6 kg)  Height: _0  (1.803 m)   BMI Assessment: Estimated body mass index is 32.78 kg/m as calculated from the following:   Height as of this encounter: _1  (1.803 m).   Weight as of this encounter: 235 lb (106.6 kg).  BMI interpretation table: BMI level Category Range association with higher incidence of chronic pain  <18 kg/m2 Underweight   18.5-24.9 kg/m2 Ideal body weight   25-29.9 kg/m2 Overweight Increased incidence by 20%  30-34.9 kg/m2 Obese (Class I) Increased incidence by 68%  35-39.9 kg/m2 Severe obesity (Class II) Increased incidence by 136%  >40 kg/m2 Extreme obesity (Class III) Increased incidence by 254%   Patient's current BMI Ideal Body weight  Body mass index is 32.78 kg/m. Ideal body  weight: 75.3 kg (166 lb 0.1 oz) Adjusted ideal body weight: 87.8 kg (193 lb 9.7 oz)   BMI Readings from Last 4 Encounters:  08/09/18 32.78 kg/m  07/17/18 33.47 kg/m  06/25/18 31.87 kg/m  06/24/18 32.55 kg/m   Wt Readings from Last 4 Encounters:  08/09/18 235 lb (106.6 kg)  07/17/18 240 lb (108.9 kg)  06/25/18 235 lb (106.6 kg)  06/24/18 240 lb (108.9 kg)  Psych/Mental status: Alert, oriented x 3 (person, place, & time)       Eyes: PERLA Respiratory: No evidence of acute respiratory distress  Cervical Spine Area Exam  Skin & Axial Inspection: No masses, redness, edema, swelling, or associated skin lesions Alignment: Symmetrical Functional ROM: Unrestricted ROM      Stability: No instability detected Muscle Tone/Strength: Functionally intact. No obvious neuro-muscular anomalies detected. Sensory (Neurological): Unimpaired Palpation: No palpable anomalies              Upper Extremity (UE) Exam    Side: Right upper extremity  Side: Left upper extremity  Skin & Extremity Inspection: Skin color, temperature, and hair growth are WNL. No peripheral edema or cyanosis. No masses, redness, swelling, asymmetry, or associated skin lesions. No contractures.  Skin & Extremity Inspection: Skin color, temperature, and hair growth are WNL. No peripheral edema or cyanosis. No masses, redness,  swelling, asymmetry, or associated skin lesions. No contractures.  Functional ROM: Unrestricted ROM          Functional ROM: Unrestricted ROM          Muscle Tone/Strength: Functionally intact. No obvious neuro-muscular anomalies detected.  Muscle Tone/Strength: Functionally intact. No obvious neuro-muscular anomalies detected.  Sensory (Neurological): Unimpaired          Sensory (Neurological): Unimpaired          Palpation: No palpable anomalies              Palpation: No palpable anomalies              Provocative Test(s):  Phalen's test: deferred Tinel's test: deferred Apley's scratch test (touch  opposite shoulder):  Action 1 (Across chest): deferred Action 2 (Overhead): deferred Action 3 (LB reach): deferred   Provocative Test(s):  Phalen's test: deferred Tinel's test: deferred Apley's scratch test (touch opposite shoulder):  Action 1 (Across chest): deferred Action 2 (Overhead): deferred Action 3 (LB reach): deferred    Thoracic Spine Area Exam  Skin & Axial Inspection: No masses, redness, or swelling Alignment: Symmetrical Functional ROM: Unrestricted ROM Stability: No instability detected Muscle Tone/Strength: Functionally intact. No obvious neuro-muscular anomalies detected. Sensory (Neurological): Unimpaired Muscle strength & Tone: No palpable anomalies  Lumbar Spine Area Exam  Skin & Axial Inspection: No masses, redness, or swelling Alignment: Symmetrical Functional ROM: Decreased ROM       Stability: No instability detected Muscle Tone/Strength: Functionally intact. No obvious neuro-muscular anomalies detected. Sensory (Neurological): Dermatomal pain pattern Palpation: No palpable anomalies       Provocative Tests: Hyperextension/rotation test: (+) due to pain. Lumbar quadrant test (Kemp's test): (+) due to pain. Lateral bending test: (+) due to pain. Patrick's Maneuver: deferred today                   FABER test: deferred today                   S-I anterior distraction/compression test: deferred today         S-I lateral compression test: deferred today         S-I Thigh-thrust test: deferred today         S-I Gaenslen's test: deferred today          Gait & Posture Assessment  Ambulation: Unassisted Gait: Relatively normal for age and body habitus Posture: WNL   Lower Extremity Exam    Side: Right lower extremity  Side: Left lower extremity  Stability: No instability observed          Stability: No instability observed          Skin & Extremity Inspection: Skin color, temperature, and hair growth are WNL. No peripheral edema or cyanosis. No masses,  redness, swelling, asymmetry, or associated skin lesions. No contractures.  Skin & Extremity Inspection: Skin color, temperature, and hair growth are WNL. No peripheral edema or cyanosis. No masses, redness, swelling, asymmetry, or associated skin lesions. No contractures.  Functional ROM: Decreased ROM for hip joint          Functional ROM: Decreased ROM for hip joint          Muscle Tone/Strength: Functionally intact. No obvious neuro-muscular anomalies detected.  Muscle Tone/Strength: Functionally intact. No obvious neuro-muscular anomalies detected.  Sensory (Neurological): Unimpaired  Sensory (Neurological): Unimpaired  Palpation: No palpable anomalies  Palpation: No palpable anomalies   Assessment  Primary Diagnosis & Pertinent Problem List: The  primary encounter diagnosis was Lumbar radiculopathy. Diagnoses of Spinal stenosis of lumbar region with neurogenic claudication, Degenerative disc disease, lumbar, S/P cervical spinal fusion, Type 2 diabetes mellitus without complication, with long-term current use of insulin ([place]), and Chronic pain syndrome were also pertinent to this visit.  Status Diagnosis  Worsening Worsening Worsening 1. Lumbar radiculopathy   2. Spinal stenosis of lumbar region with neurogenic claudication   3. Degenerative disc disease, lumbar   4. S/P cervical spinal fusion   5. Type 2 diabetes mellitus without complication, with long-term current use of insulin ([place])   6. Chronic pain syndrome      Patient follows up today to review his lumbar MRI results.  MRI shows multilevel degenerative disc bulges with moderate spinal stenosis at L4-L5 and mild spinal stenosis at L2-L3 and L3-L4.  Patient also has a L3-L4 left greater than right extraforaminal disc protrusion which is irritating bilateral L3 nerve roots.  Continues to have worsening severe axial low back pain left greater than right that radiates down to his left hip and down his left thigh.  He finds it very  debilitating and disabling.  He has an appointment with his previous surgeon for next Thursday.  Patient states that the hydrocodone is not helping.  We discussed trialing Percocet at 7.5 mg to see if it help manage his acute radicular pain.  I also discussed L3-L4 lumbar epidural steroid injection given the patient's bilateral L3 radiculopathy as seen on his lumbar MRI.  We also discussed spinal cord stimulation.  I would like input from his surgeon regarding this treatment modality as well.  Patient is not obtaining significant benefit from amitriptyline or Celebrex.  I instructed him to discontinue both of these medications.  We will trial Lyrica 50 mg 3 times daily for his neuropathic and radicular pain.  Plan: -L3-L4 ESI under fluoroscopy -Stop amitriptyline and Celebrex -Start Lyrica as below -Prescription for Percocet as below -Resources provided for spinal cord stimulation therapy -Encourage patient to follow-up with spine surgeon next Thursday and also get his input about spinal cord stimulation vs surgery   Plan of Care  Pharmacotherapy (Medications Ordered): Meds ordered this encounter  Medications  . oxyCODONE-acetaminophen (PERCOCET) 7.5-325 MG tablet    Sig: Take 1 tablet by mouth 3 (three) times daily as needed for severe pain.    Dispense:  90 tablet    Refill:  0    Do not place this medication, or any other prescription from our practice, on "Automatic Refill". Patient may have prescription filled one day early if pharmacy is closed on scheduled refill date.  . pregabalin (LYRICA) 50 MG capsule    Sig: Take 1 capsule (50 mg total) by mouth 3 (three) times daily.    Dispense:  90 capsule    Refill:  1    Do not place this medication, or any other prescription from our practice, on "Automatic Refill". Patient may have prescription filled one day early if pharmacy is closed on scheduled refill date.   Lab-work, procedure(s), and/or referral(s): Orders Placed This Encounter   Procedures  . Lumbar Epidural Injection   Time Note: Greater than 50% of the 25 minute(s) of face-to-face time spent with John Hartman, was spent in counseling/coordination of care regarding: John Hartman primary cause of pain, the treatment plan, treatment alternatives, the risks and possible complications of proposed treatment, medication side effects, going over the informed consent, the opioid analgesic risks and possible complications, the appropriate use of his medications, realistic  expectations and the goals of pain management (increased in functionality).  Provider-requested follow-up: Return in about 6 days (around 08/15/2018).  No future appointments.  Primary Care Physician: Arnetha Courser, MD Location: [facility] Note by: Gillis Santa, M.D Date: 08/09/2018; Time: 9:46 AM  Patient Instructions  1. Stop Celebrex and Amitriptyline 2. Rx for Percocet and Lyrica 3. Schedule for L3/4 ESI with sedation  Percocet and Lyrica have been escribed to your pharmacy - to last until 09/08/2018.  Epidural Steroid Injection Patient Information  Description: The epidural space surrounds the nerves as they exit the spinal cord.  In some patients, the nerves can be compressed and inflamed by a bulging disc or a tight spinal canal (spinal stenosis).  By injecting steroids into the epidural space, we can bring irritated nerves into direct contact with a potentially helpful medication.  These steroids act directly on the irritated nerves and can reduce swelling and inflammation which often leads to decreased pain.  Epidural steroids may be injected anywhere along the spine and from the neck to the low back depending upon the location of your pain.   After numbing the skin with local anesthetic (like Novocaine), a small needle is passed into the epidural space slowly.  You may experience a sensation of pressure while this is being done.  The entire block usually last less than  10 minutes.  Conditions which may be treated by epidural steroids:   Low back and leg pain  Neck and arm pain  Spinal stenosis  Post-laminectomy syndrome  Herpes zoster (shingles) pain  Pain from compression fractures  Preparation for the injection:  1. Do not eat any solid food or dairy products within 8 hours of your appointment.  2. You may drink clear liquids up to 3 hours before appointment.  Clear liquids include water, black coffee, juice or soda.  No milk or cream please. 3. You may take your regular medication, including pain medications, with a sip of water before your appointment  Diabetics should hold regular insulin (if taken separately) and take 1/2 normal NPH dos the morning of the procedure.  Carry some sugar containing items with you to your appointment. 4. A driver must accompany you and be prepared to drive you home after your procedure.  5. Bring all your current medications with your. 6. An IV may be inserted and sedation may be given at the discretion of the physician.   7. A blood pressure cuff, EKG and other monitors will often be applied during the procedure.  Some patients may need to have extra oxygen administered for a short period. 8. You will be asked to provide medical information, including your allergies, prior to the procedure.  We must know immediately if you are taking blood thinners (like Coumadin/Warfarin)  Or if you are allergic to IV iodine contrast (dye). We must know if you could possible be pregnant.  Possible side-effects:  Bleeding from needle site  Infection (rare, may require surgery)  Nerve injury (rare)  Numbness & tingling (temporary)  Difficulty urinating (rare, temporary)  Spinal headache ( a headache worse with upright posture)  Light -headedness (temporary)  Pain at injection site (several days)  Decreased blood pressure (temporary)  Weakness in arm/leg (temporary)  Pressure sensation in back/neck  (temporary)  Call if you experience:  Fever/chills associated with headache or increased back/neck pain.  Headache worsened by an upright position.  New onset weakness or numbness of an extremity below the injection site  Hives or difficulty breathing (go to the emergency room)  Inflammation or drainage at the infection site  Severe back/neck pain  Any new symptoms which are concerning to you  Please note:  Although the local anesthetic injected can often make your back or neck feel good for several   hours after the injection, the pain will likely return.  It takes 3-7 days for steroids to work in the epidural space.  You may not notice any pain relief for at least that one week.  If effective, we will often do a series of three injections spaced 3-6 weeks apart to maximally decrease your pain.  After the initial series, we generally will wait several months before considering a repeat injection of the same type.  If you have any questions, please call [phone] [facility] Moderate Conscious Sedation, Adult Sedation is the use of medicines to promote relaxation and relieve discomfort and anxiety. Moderate conscious sedation is a type of sedation. Under moderate conscious sedation, you are less alert than normal, but you are still able to respond to instructions, touch, or both. Moderate conscious sedation is used during short medical and dental procedures. It is milder than deep sedation, which is a type of sedation under which you cannot be easily woken up. It is also milder than general anesthesia, which is the use of medicines to make you unconscious. Moderate conscious sedation allows you to return to your regular activities sooner. Tell a health care provider about:  Any allergies you have.  All medicines you are taking, including vitamins, herbs, eye drops, creams, and over-the-counter medicines.  Use of steroids (by mouth or  creams).  Any problems you or family members have had with sedatives and anesthetic medicines.  Any blood disorders you have.  Any surgeries you have had.  Any medical conditions you have, such as sleep apnea.  Whether you are pregnant or may be pregnant.  Any use of cigarettes, alcohol, marijuana, or street drugs. What are the risks? Generally, this is a safe procedure. However, problems may occur, including:  Getting too much medicine (oversedation).  Nausea.  Allergic reaction to medicines.  Trouble breathing. If this happens, a breathing tube may be used to help with breathing. It will be removed when you are awake and breathing on your own.  Heart trouble.  Lung trouble.  What happens before the procedure? Staying hydrated Follow instructions from your health care provider about hydration, which may include:  Up to 2 hours before the procedure - you may continue to drink clear liquids, such as water, clear fruit juice, black coffee, and plain tea.  Eating and drinking restrictions Follow instructions from your health care provider about eating and drinking, which may include:  8 hours before the procedure - stop eating heavy meals or foods such as meat, fried foods, or fatty foods.  6 hours before the procedure - stop eating light meals or foods, such as toast or cereal.  6 hours before the procedure - stop drinking milk or drinks that contain milk.  2 hours before the procedure - stop drinking clear liquids.  Medicine  Ask your health care provider about:  Changing or stopping your regular medicines. This is especially important if you are taking diabetes medicines or blood thinners.  Taking medicines such as aspirin and ibuprofen. These medicines can thin your blood. Do not take these medicines before your procedure if your health care provider instructs you not to.  Tests and  exams  You will have a physical exam.  You may have blood tests done to  show: ? How well your kidneys and liver are working. ? How well your blood can clot. General instructions  Plan to have someone take you home from the hospital or clinic.  If you will be going home right after the procedure, plan to have someone with you for 24 hours. What happens during the procedure?  An IV tube will be inserted into one of your veins.  Medicine to help you relax (sedative) will be given through the IV tube.  The medical or dental procedure will be performed. What happens after the procedure?  Your blood pressure, heart rate, breathing rate, and blood oxygen level will be monitored often until the medicines you were given have worn off.  Do not drive for 24 hours. This information is not intended to replace advice given to you by your health care provider. Make sure you discuss any questions you have with your health care provider. Document Released: 09/06/2001 Document Revised: 05/17/2016 Document Reviewed: 04/02/2016 Elsevier Interactive Patient Education  [employer].

## 2018-08-09 NOTE — Progress Notes (Signed)
Safety precautions to be maintained throughout the outpatient stay will include: orient to surroundings, keep bed in low position, maintain call bell within reach at all times, provide assistance with transfer out of bed and ambulation.  

## 2018-08-09 NOTE — Patient Instructions (Addendum)
1. Stop Celebrex and Amitriptyline 2. Rx for Percocet and Lyrica 3. Schedule for L3/4 ESI with sedation  Percocet and Lyrica have been escribed to your pharmacy - to last until 09/08/2018.  Epidural Steroid Injection Patient Information  Description: The epidural space surrounds the nerves as they exit the spinal cord.  In some patients, the nerves can be compressed and inflamed by a bulging disc or a tight spinal canal (spinal stenosis).  By injecting steroids into the epidural space, we can bring irritated nerves into direct contact with a potentially helpful medication.  These steroids act directly on the irritated nerves and can reduce swelling and inflammation which often leads to decreased pain.  Epidural steroids may be injected anywhere along the spine and from the neck to the low back depending upon the location of your pain.   After numbing the skin with local anesthetic (like Novocaine), a small needle is passed into the epidural space slowly.  You may experience a sensation of pressure while this is being done.  The entire block usually last less than 10 minutes.  Conditions which may be treated by epidural steroids:   Low back and leg pain  Neck and arm pain  Spinal stenosis  Post-laminectomy syndrome  Herpes zoster (shingles) pain  Pain from compression fractures  Preparation for the injection:  1. Do not eat any solid food or dairy products within 8 hours of your appointment.  2. You may drink clear liquids up to 3 hours before appointment.  Clear liquids include water, black coffee, juice or soda.  No milk or cream please. 3. You may take your regular medication, including pain medications, with a sip of water before your appointment  Diabetics should hold regular insulin (if taken separately) and take 1/2 normal NPH dos the morning of the procedure.  Carry some sugar containing items with you to your appointment. 4. A driver must accompany you and be prepared to drive  you home after your procedure.  5. Bring all your current medications with your. 6. An IV may be inserted and sedation may be given at the discretion of the physician.   7. A blood pressure cuff, EKG and other monitors will often be applied during the procedure.  Some patients may need to have extra oxygen administered for a short period. 8. You will be asked to provide medical information, including your allergies, prior to the procedure.  We must know immediately if you are taking blood thinners (like Coumadin/Warfarin)  Or if you are allergic to IV iodine contrast (dye). We must know if you could possible be pregnant.  Possible side-effects:  Bleeding from needle site  Infection (rare, may require surgery)  Nerve injury (rare)  Numbness & tingling (temporary)  Difficulty urinating (rare, temporary)  Spinal headache ( a headache worse with upright posture)  Light -headedness (temporary)  Pain at injection site (several days)  Decreased blood pressure (temporary)  Weakness in arm/leg (temporary)  Pressure sensation in back/neck (temporary)  Call if you experience:  Fever/chills associated with headache or increased back/neck pain.  Headache worsened by an upright position.  New onset weakness or numbness of an extremity below the injection site  Hives or difficulty breathing (go to the emergency room)  Inflammation or drainage at the infection site  Severe back/neck pain  Any new symptoms which are concerning to you  Please note:  Although the local anesthetic injected can often make your back or neck feel good for several   hours  after the injection, the pain will likely return.  It takes 3-7 days for steroids to work in the epidural space.  You may not notice any pain relief for at least that one week.  If effective, we will often do a series of three injections spaced 3-6 weeks apart to maximally decrease your pain.  After the initial series, we generally will  wait several months before considering a repeat injection of the same type.  If you have any questions, please call [phone] [facility] Moderate Conscious Sedation, Adult Sedation is the use of medicines to promote relaxation and relieve discomfort and anxiety. Moderate conscious sedation is a type of sedation. Under moderate conscious sedation, you are less alert than normal, but you are still able to respond to instructions, touch, or both. Moderate conscious sedation is used during short medical and dental procedures. It is milder than deep sedation, which is a type of sedation under which you cannot be easily woken up. It is also milder than general anesthesia, which is the use of medicines to make you unconscious. Moderate conscious sedation allows you to return to your regular activities sooner. Tell a health care provider about:  Any allergies you have.  All medicines you are taking, including vitamins, herbs, eye drops, creams, and over-the-counter medicines.  Use of steroids (by mouth or creams).  Any problems you or family members have had with sedatives and anesthetic medicines.  Any blood disorders you have.  Any surgeries you have had.  Any medical conditions you have, such as sleep apnea.  Whether you are pregnant or may be pregnant.  Any use of cigarettes, alcohol, marijuana, or street drugs. What are the risks? Generally, this is a safe procedure. However, problems may occur, including:  Getting too much medicine (oversedation).  Nausea.  Allergic reaction to medicines.  Trouble breathing. If this happens, a breathing tube may be used to help with breathing. It will be removed when you are awake and breathing on your own.  Heart trouble.  Lung trouble.  What happens before the procedure? Staying hydrated Follow instructions from your health care provider about hydration, which may include:  Up to 2 hours before the  procedure - you may continue to drink clear liquids, such as water, clear fruit juice, black coffee, and plain tea.  Eating and drinking restrictions Follow instructions from your health care provider about eating and drinking, which may include:  8 hours before the procedure - stop eating heavy meals or foods such as meat, fried foods, or fatty foods.  6 hours before the procedure - stop eating light meals or foods, such as toast or cereal.  6 hours before the procedure - stop drinking milk or drinks that contain milk.  2 hours before the procedure - stop drinking clear liquids.  Medicine  Ask your health care provider about:  Changing or stopping your regular medicines. This is especially important if you are taking diabetes medicines or blood thinners.  Taking medicines such as aspirin and ibuprofen. These medicines can thin your blood. Do not take these medicines before your procedure if your health care provider instructs you not to.  Tests and exams  You will have a physical exam.  You may have blood tests done to show: ? How well your kidneys and liver are working. ? How well your blood can clot. General instructions  Plan to have someone take you home from the hospital or clinic.  If you will  be going home right after the procedure, plan to have someone with you for 24 hours. What happens during the procedure?  An IV tube will be inserted into one of your veins.  Medicine to help you relax (sedative) will be given through the IV tube.  The medical or dental procedure will be performed. What happens after the procedure?  Your blood pressure, heart rate, breathing rate, and blood oxygen level will be monitored often until the medicines you were given have worn off.  Do not drive for 24 hours. This information is not intended to replace advice given to you by your health care provider. Make sure you discuss any questions you have with your health care  provider. Document Released: 09/06/2001 Document Revised: 05/17/2016 Document Reviewed: 04/02/2016 Elsevier Interactive Patient Education  [employer].

## 2018-08-10 LAB — MICROALBUMIN, URINE: MICROALB UR: 57.1

## 2018-08-10 LAB — HEMOGLOBIN A1C: HEMOGLOBIN A1C: 7.1 — AB (ref 4.0–6.0)

## 2018-08-16 ENCOUNTER — Encounter: Payer: Self-pay | Admitting: Student in an Organized Health Care Education/Training Program

## 2018-08-20 ENCOUNTER — Ambulatory Visit: Payer: 59

## 2018-08-20 ENCOUNTER — Ambulatory Visit
Admission: RE | Admit: 2018-08-20 | Discharge: 2018-08-20 | Disposition: A | Discharge status: Home / Self Care | Payer: No Typology Code available for payment source | Source: Ambulatory Visit | Attending: Student in an Organized Health Care Education/Training Program | Admitting: Student in an Organized Health Care Education/Training Program

## 2018-08-20 ENCOUNTER — Encounter: Payer: Self-pay | Admitting: Student in an Organized Health Care Education/Training Program

## 2018-08-20 ENCOUNTER — Ambulatory Visit (HOSPITAL_BASED_OUTPATIENT_CLINIC_OR_DEPARTMENT_OTHER)
Payer: No Typology Code available for payment source | Admitting: Student in an Organized Health Care Education/Training Program

## 2018-08-20 VITALS — BP 171/105 | HR 58 | Temp 98.0°F | Resp 18 | Ht 71.0 in | Wt 230.0 lb

## 2018-08-20 DIAGNOSIS — Z794 Long term (current) use of insulin: Secondary | ICD-10-CM | POA: Insufficient documentation

## 2018-08-20 DIAGNOSIS — M5416 Radiculopathy, lumbar region: Secondary | ICD-10-CM

## 2018-08-20 DIAGNOSIS — Z79899 Other long term (current) drug therapy: Secondary | ICD-10-CM

## 2018-08-20 DIAGNOSIS — Z888 Allergy status to other drugs, medicaments and biological substances status: Secondary | ICD-10-CM

## 2018-08-20 DIAGNOSIS — Z9889 Other specified postprocedural states: Secondary | ICD-10-CM

## 2018-08-20 DIAGNOSIS — F410 Panic disorder [episodic paroxysmal anxiety] without agoraphobia: Secondary | ICD-10-CM | POA: Diagnosis not present

## 2018-08-20 DIAGNOSIS — I214 Non-ST elevation (NSTEMI) myocardial infarction: Secondary | ICD-10-CM | POA: Diagnosis not present

## 2018-08-20 MED ORDER — FENTANYL CITRATE (PF) 100 MCG/2ML IJ SOLN
25.0000 ug | INTRAMUSCULAR | Status: AC | PRN
  Administered 2018-08-20: 50 ug via INTRAVENOUS
  Administered 2018-08-20: 100 ug via INTRAVENOUS

## 2018-08-20 MED ORDER — LIDOCAINE HCL 2 % IJ SOLN: 10.0000 mL | Freq: Once | INTRAMUSCULAR | Status: DC

## 2018-08-20 MED ORDER — DEXAMETHASONE SODIUM PHOSPHATE 10 MG/ML IJ SOLN
INTRAMUSCULAR | Status: AC
  Filled 2018-08-20: qty 1

## 2018-08-20 MED ORDER — SODIUM CHLORIDE 0.9% FLUSH
2.0000 mL | Freq: Once | INTRAVENOUS | Status: AC
  Administered 2018-08-20: 2 mL

## 2018-08-20 MED ORDER — ROPIVACAINE HCL 2 MG/ML IJ SOLN: 2.0000 mL | Freq: Once | INTRAMUSCULAR | Status: DC

## 2018-08-20 MED ORDER — LACTATED RINGERS IV SOLN
1000.0000 mL | Freq: Once | INTRAVENOUS | Status: AC
  Administered 2018-08-20: 1000 mL via INTRAVENOUS

## 2018-08-20 MED ORDER — SODIUM CHLORIDE 0.9% FLUSH: 2.0000 mL | Freq: Once | INTRAVENOUS | Status: DC

## 2018-08-20 MED ORDER — LIDOCAINE HCL 2 % IJ SOLN
10.0000 mL | Freq: Once | INTRAMUSCULAR | Status: AC
Start: 2018-08-20 — End: 2018-08-20
  Administered 2018-08-20: 200 mg

## 2018-08-20 MED ORDER — FENTANYL CITRATE (PF) 100 MCG/2ML IJ SOLN: 25.0000 ug | INTRAMUSCULAR | Status: DC | PRN

## 2018-08-20 MED ORDER — IOPAMIDOL (ISOVUE-M 200) INJECTION 41%
INTRAMUSCULAR | Status: AC
  Filled 2018-08-20: qty 10

## 2018-08-20 MED ORDER — IOPAMIDOL (ISOVUE-M 200) INJECTION 41%: 10.0000 mL | Freq: Once | INTRAMUSCULAR | Status: DC

## 2018-08-20 MED ORDER — DEXAMETHASONE SODIUM PHOSPHATE 10 MG/ML IJ SOLN
10.0000 mg | Freq: Once | INTRAMUSCULAR | Status: AC
  Administered 2018-08-20: 10 mg

## 2018-08-20 MED ORDER — LACTATED RINGERS IV SOLN: 1000.0000 mL | Freq: Once | INTRAVENOUS | Status: DC

## 2018-08-20 MED ORDER — FENTANYL CITRATE (PF) 100 MCG/2ML IJ SOLN
INTRAMUSCULAR | Status: AC
  Filled 2018-08-20: qty 2

## 2018-08-20 MED ORDER — DEXAMETHASONE SODIUM PHOSPHATE 10 MG/ML IJ SOLN: 10.0000 mg | Freq: Once | INTRAMUSCULAR | Status: DC

## 2018-08-20 MED ORDER — LIDOCAINE HCL 2 % IJ SOLN
INTRAMUSCULAR | Status: AC
  Filled 2018-08-20: qty 20

## 2018-08-20 MED ORDER — SODIUM CHLORIDE 0.9 % IJ SOLN
INTRAMUSCULAR | Status: AC
  Filled 2018-08-20: qty 10

## 2018-08-20 MED ORDER — ROPIVACAINE HCL 2 MG/ML IJ SOLN
INTRAMUSCULAR | Status: AC
  Filled 2018-08-20: qty 10

## 2018-08-20 NOTE — Patient Instructions (Addendum)
Post-procedure Information What to expect: Most procedures involve the use of a local anesthetic (numbing medicine), and a steroid (anti-inflammatory medicine).  The local anesthetics may cause temporary numbness and weakness of the legs or arms, depending on the location of the block. This numbness/weakness may last 4-6 hours, depending on the local anesthetic used. In rare instances, it can last up to 24 hours. While numb, you must be very careful not to injure the extremity.  After any procedure, you could expect the pain to get better within 15-20 minutes. This relief is temporary and may last 4-6 hours. Once the local anesthetics wears off, you could experience discomfort, possibly more than usual, for up to 10 (ten) days. In the case of radiofrequencies, it may last up to 6 weeks. Surgeries may take up to 8 weeks for the healing process. The discomfort is due to the irritation caused by needles going through skin and muscle. To minimize the discomfort, we recommend using ice the first day, and heat from then on. The ice should be applied for 15 minutes on, and 15 minutes off. Keep repeating this cycle until bedtime. Avoid applying the ice directly to the skin, to prevent frostbite. Heat should be used daily, until the pain improves (4-10 days). Be careful not to burn yourself.  Occasionally you may experience muscle spasms or cramps. These occur as a consequence of the irritation caused by the needle sticks to the muscle and the blood that will inevitably be lost into the surrounding muscle tissue. Blood tends to be very irritating to tissues, which tend to react by going into spasm. These spasms may start the same day of your procedure, but they may also take days to develop. This late onset type of spasm or cramp is usually caused by electrolyte imbalances triggered by the steroids, at the level of the kidney. Cramps and spasms tend to respond well to muscle relaxants, multivitamins (some are  triggered by the procedure, but may have their origins in vitamin deficiencies), and "Gatorade", or any sports drinks that can replenish any electrolyte imbalances. (If you are a diabetic, ask your pharmacist to get you a sugar-free brand.) Warm showers or baths may also be helpful. Stretching exercises are highly recommended. General Instructions:  Be alert for signs of possible infection: redness, swelling, heat, red streaks, elevated temperature, and/or fever. These typically appear 4 to 6 days after the procedure. Immediately notify your doctor if you experience unusual bleeding, difficulty breathing, or loss of bowel or bladder control. If you experience increased pain, do not increase your pain medicine intake, unless instructed by your pain physician. Post-Procedure Care:  Be careful in moving about. Muscle spasms in the area of the injection may occur. Applying ice or heat to the area is often helpful. The incidence of spinal headaches after epidural injections ranges between 1.4% and 6%. If you develop a headache that does not seem to respond to conservative therapy, please let your physician know. This can be treated with an epidural blood patch.   Post-procedure numbness or redness is to be expected, however it should average 4 to 6 hours. If numbness and weakness of your extremities begins to develop 4 to 6 hours after your procedure, and is felt to be progressing and worsening, immediately contact your physician.   Diet:  If you experience nausea, do not eat until this sensation goes away. If you had a "Stellate Ganglion Block" for upper extremity "Reflex Sympathetic Dystrophy", do not eat or drink until your   hoarseness goes away. In any case, always start with liquids first and if you tolerate them well, then slowly progress to more solid foods. Activity:  For the first 4 to 6 hours after the procedure, use caution in moving about as you may experience numbness and/or weakness. Use caution in  cooking, using household electrical appliances, and climbing steps. If you need to reach your Doctor call our office: [phone] Monday-Thursday 8:00 am - 4:00 PM    Fridays: Closed     In case of an emergency: In case of emergency, call 911 or go to the nearest emergency room and have the physician there call us.  Interpretation of Procedure Every nerve block has two components: a diagnostic component, and a treatment component. Unrealistic expectations are the most common causes of "perceived failure".  In a perfect world, a single nerve block should be able to completely and permanently eliminate the pain. Sadly, the world is not perfect.  Most pain management nerve blocks are performed using local anesthetics and steroids. Steroids are responsible for any long-term benefit that you may experience. Their purpose is to decrease any chronic swelling that may exist in the area. Steroids begin to work immediately after being injected. However, most patients will not experience any benefits until 5 to 10 days after the injection, when the swelling has come down to the point where they can tell a difference. Steroids will only help if there is swelling to be treated. As such, they can assist with the diagnosis. If effective, they suggest an inflammatory component to the pain, and if ineffective, they rule out inflammation as the main cause or component of the problem. If the problem is one of mechanical compression, you will get no benefit from those steroids.   In the case of local anesthetics, they have a crucial role in the diagnosis of your condition. Most will begin to work within15 to 20 minutes after injection. The duration will depend on the type used (short- vs. Long-acting). It is of outmost importance that patients keep tract of their pain, after the procedure. To assist with this matter, a "Post-procedure Pain Diary" is provided. Make sure to complete it and to bring it back to your  follow-up appointment.  As long as the patient keeps accurate, detailed records of their symptoms after every procedure, and returns to have those interpreted, every procedure will provide us with invaluable information. Even a block that does not provide the patient with any relief, will always provide us with information about the mechanism and the origin of the pain. The only time a nerve block can be considered a waste of time is when patients do not keep track of the results, or do not keep their post-procedure appointment.  Reporting the results back to your physician The Pain Score  Pain is a subjective complaint. It cannot be seen, touched, or measured. We depend entirely on the patient's report of the pain in order to assess your condition and treatment. To evaluate the pain, we use a pain scale, where "0" means "No Pain", and a "10" is "the worst possible pain that you can even imagine" (i.e. something like been eaten alive by a shark or being torn apart by a lion).   You will frequently be asked to rate your pain. Please be as accurate, remember that medical decisions will be based on your responses. Please do not rate your pain above a 10. Doing so is actually interpreted as "symptom magnification" (exaggeration), as   well as lack of understanding with regards to the scale. To put this into perspective, when you tell us that your pain is at a 10 (ten), what you are saying is that there is nothing we can do to make this pain any worse. (Carefully think about that.) Post-procedure Information What to expect: Most procedures involve the use of a local anesthetic (numbing medicine), and a steroid (anti-inflammatory medicine).  The local anesthetics may cause temporary numbness and weakness of the legs or arms, depending on the location of the block. This numbness/weakness may last 4-6 hours, depending on the local anesthetic used. In rare instances, it can last up to 24 hours. While numb, you  must be very careful not to injure the extremity.  After any procedure, you could expect the pain to get better within 15-20 minutes. This relief is temporary and may last 4-6 hours. Once the local anesthetics wears off, you could experience discomfort, possibly more than usual, for up to 10 (ten) days. In the case of radiofrequencies, it may last up to 6 weeks. Surgeries may take up to 8 weeks for the healing process. The discomfort is due to the irritation caused by needles going through skin and muscle. To minimize the discomfort, we recommend using ice the first day, and heat from then on. The ice should be applied for 15 minutes on, and 15 minutes off. Keep repeating this cycle until bedtime. Avoid applying the ice directly to the skin, to prevent frostbite. Heat should be used daily, until the pain improves (4-10 days). Be careful not to burn yourself.  Occasionally you may experience muscle spasms or cramps. These occur as a consequence of the irritation caused by the needle sticks to the muscle and the blood that will inevitably be lost into the surrounding muscle tissue. Blood tends to be very irritating to tissues, which tend to react by going into spasm. These spasms may start the same day of your procedure, but they may also take days to develop. This late onset type of spasm or cramp is usually caused by electrolyte imbalances triggered by the steroids, at the level of the kidney. Cramps and spasms tend to respond well to muscle relaxants, multivitamins (some are triggered by the procedure, but may have their origins in vitamin deficiencies), and "Gatorade", or any sports drinks that can replenish any electrolyte imbalances. (If you are a diabetic, ask your pharmacist to get you a sugar-free brand.) Warm showers or baths may also be helpful. Stretching exercises are highly recommended. General Instructions:  Be alert for signs of possible infection: redness, swelling, heat, red streaks, elevated  temperature, and/or fever. These typically appear 4 to 6 days after the procedure. Immediately notify your doctor if you experience unusual bleeding, difficulty breathing, or loss of bowel or bladder control. If you experience increased pain, do not increase your pain medicine intake, unless instructed by your pain physician. Post-Procedure Care:  Be careful in moving about. Muscle spasms in the area of the injection may occur. Applying ice or heat to the area is often helpful. The incidence of spinal headaches after epidural injections ranges between 1.4% and 6%. If you develop a headache that does not seem to respond to conservative therapy, please let your physician know. This can be treated with an epidural blood patch.   Post-procedure numbness or redness is to be expected, however it should average 4 to 6 hours. If numbness and weakness of your extremities begins to develop 4 to 6 hours after   your procedure, and is felt to be progressing and worsening, immediately contact your physician.   Diet:  If you experience nausea, do not eat until this sensation goes away. If you had a "Stellate Ganglion Block" for upper extremity "Reflex Sympathetic Dystrophy", do not eat or drink until your hoarseness goes away. In any case, always start with liquids first and if you tolerate them well, then slowly progress to more solid foods. Activity:  For the first 4 to 6 hours after the procedure, use caution in moving about as you may experience numbness and/or weakness. Use caution in cooking, using household electrical appliances, and climbing steps. If you need to reach your Doctor call our office: [phone] Monday-Thursday 8:00 am - 4:00 PM    Fridays: Closed     In case of an emergency: In case of emergency, call 911 or go to the nearest emergency room and have the physician there call us.  Interpretation of Procedure Every nerve block has two components: a diagnostic component, and a treatment  component. Unrealistic expectations are the most common causes of "perceived failure".  In a perfect world, a single nerve block should be able to completely and permanently eliminate the pain. Sadly, the world is not perfect.  Most pain management nerve blocks are performed using local anesthetics and steroids. Steroids are responsible for any long-term benefit that you may experience. Their purpose is to decrease any chronic swelling that may exist in the area. Steroids begin to work immediately after being injected. However, most patients will not experience any benefits until 5 to 10 days after the injection, when the swelling has come down to the point where they can tell a difference. Steroids will only help if there is swelling to be treated. As such, they can assist with the diagnosis. If effective, they suggest an inflammatory component to the pain, and if ineffective, they rule out inflammation as the main cause or component of the problem. If the problem is one of mechanical compression, you will get no benefit from those steroids.   In the case of local anesthetics, they have a crucial role in the diagnosis of your condition. Most will begin to work within15 to 20 minutes after injection. The duration will depend on the type used (short- vs. Long-acting). It is of outmost importance that patients keep tract of their pain, after the procedure. To assist with this matter, a "Post-procedure Pain Diary" is provided. Make sure to complete it and to bring it back to your follow-up appointment.  As long as the patient keeps accurate, detailed records of their symptoms after every procedure, and returns to have those interpreted, every procedure will provide us with invaluable information. Even a block that does not provide the patient with any relief, will always provide us with information about the mechanism and the origin of the pain. The only time a nerve block can be considered a waste of time is  when patients do not keep track of the results, or do not keep their post-procedure appointment.  Reporting the results back to your physician The Pain Score  Pain is a subjective complaint. It cannot be seen, touched, or measured. We depend entirely on the patient's report of the pain in order to assess your condition and treatment. To evaluate the pain, we use a pain scale, where "0" means "No Pain", and a "10" is "the worst possible pain that you can even imagine" (i.e. something like been eaten alive by a shark or   being torn apart by a lion).   You will frequently be asked to rate your pain. Please be as accurate, remember that medical decisions will be based on your responses. Please do not rate your pain above a 10. Doing so is actually interpreted as "symptom magnification" (exaggeration), as well as lack of understanding with regards to the scale. To put this into perspective, when you tell us that your pain is at a 10 (ten), what you are saying is that there is nothing we can do to make this pain any worse. (Carefully think about that.)  

## 2018-08-20 NOTE — Progress Notes (Signed)
Patient's Name: John Hartman  MRN: [id number]  Referring Provider: Gillis Santa, MD  DOB: 07-13-1971  PCP: Arnetha Courser, MD  DOS: 08/20/2018  Note by: Gillis Santa, MD  Service setting: Ambulatory outpatient  Specialty: Interventional Pain Management  Patient type: Established  Location: [facility] (AMB) Pain Management Facility  Visit type: Interventional Procedure   Primary Reason for Visit: Interventional Pain Management Treatment. CC: Back Pain (lower bilateral.  was predominately left for approx 6 weeks but is equally on both sides for about the past 3 -4 days. )  Procedure:          Anesthesia, Analgesia, Anxiolysis:  Type: Therapeutic Inter-Laminar Epidural Steroid Injection #1  Region: Lumbar Level: L4-5 Level. Laterality: Left-Sided         Type: Moderate (Conscious) Sedation combined with Local Anesthesia Indication(s): Analgesia and Anxiety Route: Intravenous (IV) IV Access: Secured Sedation: Meaningful verbal contact was maintained at all times during the procedure  Local Anesthetic: Lidocaine 1-2%   Indications: 1. Lumbar radiculopathy   2. Lumbar radiculopathy    Pain Score: Pre-procedure: 7 /10 Post-procedure: 0-No pain/10  Pre-op Assessment:  Mr. Fournet is a 47 y.o. (year old), male patient, seen today for interventional treatment. He  has a past surgical history that includes Shoulder surgery (Left, 2007); Nasal septum surgery (2004); ACDF with fusion (2007); Testicular torsion (1980s); Vasectomy; Colonoscopy with propofol (N/A, 06/26/2015); and Cardiac catheterization (Left, 08/15/2016). Mr. Gidney has a current medication list which includes the following prescription(s): accu-chek softclix lancets, allopurinol, atenolol, cetirizine, colchicine, dapagliflozin-metformin hcl er, diphenhydramine, epinephrine, glucose blood, glucose blood, hydrochlorothiazide, insulin degludec, insulin pen needle, lidocaine, melatonin, oxycodone-acetaminophen, pregabalin, ranitidine, tizanidine,  metformin, and metformin, and the following Facility-Administered Medications: dexamethasone, fentanyl, iopamidol, iopamidol, lactated ringers, lidocaine, ropivacaine (pf) 2 mg/ml (0.2%), ropivacaine (pf) 2 mg/ml (0.2%), and sodium chloride flush. His primarily concern today is the Back Pain (lower bilateral.  was predominately left for approx 6 weeks but is equally on both sides for about the past 3 -4 days. )  Initial Vital Signs:  Pulse/HCG Rate: (!) 57ECG Heart Rate: 66 Temp: 98.2 F (36.8 C) Resp: 16 BP: (!) 167/121 SpO2: 100 %  BMI: Estimated body mass index is 32.08 kg/m as calculated from the following:   Height as of this encounter: 5\' 11"  (1.803 m).   Weight as of this encounter: 230 lb (104.3 kg).  Risk Assessment: Allergies: Reviewed. He is allergic to lisinopril and ace inhibitors.  Allergy Precautions: None required Coagulopathies: Reviewed. None identified.  Blood-thinner therapy: None at this time Active Infection(s): Reviewed. None identified. Mr. Craton is afebrile  Site Confirmation: Mr. Lewman was asked to confirm the procedure and laterality before marking the site Procedure checklist: Completed Consent: Before the procedure and under the influence of no sedative(s), amnesic(s), or anxiolytics, the patient was informed of the treatment options, risks and possible complications. To fulfill our ethical and legal obligations, as recommended by the American Medical Association's Code of Ethics, I have informed the patient of my clinical impression; the nature and purpose of the treatment or procedure; the risks, benefits, and possible complications of the intervention; the alternatives, including doing nothing; the risk(s) and benefit(s) of the alternative treatment(s) or procedure(s); and the risk(s) and benefit(s) of doing nothing. The patient was provided information about the general risks and possible complications associated with the procedure. These may include, but are  not limited to: failure to achieve desired goals, infection, bleeding, organ or nerve damage, allergic reactions, paralysis, and death. In addition,  the patient was informed of those risks and complications associated to Spine-related procedures, such as failure to decrease pain; infection (i.e.: Meningitis, epidural or intraspinal abscess); bleeding (i.e.: epidural hematoma, subarachnoid hemorrhage, or any other type of intraspinal or peri-dural bleeding); organ or nerve damage (i.e.: Any type of peripheral nerve, nerve root, or spinal cord injury) with subsequent damage to sensory, motor, and/or autonomic systems, resulting in permanent pain, numbness, and/or weakness of one or several areas of the body; allergic reactions; (i.e.: anaphylactic reaction); and/or death. Furthermore, the patient was informed of those risks and complications associated with the medications. These include, but are not limited to: allergic reactions (i.e.: anaphylactic or anaphylactoid reaction(s)); adrenal axis suppression; blood sugar elevation that in diabetics may result in ketoacidosis or comma; water retention that in patients with history of congestive heart failure may result in shortness of breath, pulmonary edema, and decompensation with resultant heart failure; weight gain; swelling or edema; medication-induced neural toxicity; particulate matter embolism and blood vessel occlusion with resultant organ, and/or nervous system infarction; and/or aseptic necrosis of one or more joints. Finally, the patient was informed that Medicine is not an exact science; therefore, there is also the possibility of unforeseen or unpredictable risks and/or possible complications that may result in a catastrophic outcome. The patient indicated having understood very clearly. We have given the patient no guarantees and we have made no promises. Enough time was given to the patient to ask questions, all of which were answered to the patient's  satisfaction. Mr. Sermon has indicated that he wanted to continue with the procedure. Attestation: I, the ordering provider, attest that I have discussed with the patient the benefits, risks, side-effects, alternatives, likelihood of achieving goals, and potential problems during recovery for the procedure that I have provided informed consent. Date  Time: 08/20/2018  8:41 AM  Pre-Procedure Preparation:  Monitoring: As per clinic protocol. Respiration, ETCO2, SpO2, BP, heart rate and rhythm monitor placed and checked for adequate function Safety Precautions: Patient was assessed for positional comfort and pressure points before starting the procedure. Time-out: I initiated and conducted the "Time-out" before starting the procedure, as per protocol. The patient was asked to participate by confirming the accuracy of the "Time Out" information. Verification of the correct person, site, and procedure were performed and confirmed by me, the nursing staff, and the patient. "Time-out" conducted as per Joint Commission's Universal Protocol (UP.01.01.01). Time: 0935  Description of Procedure:          Position: Prone with head of the table was raised to facilitate breathing. Target Area: The interlaminar space, initially targeting the lower laminar border of the superior vertebral body. Approach: Paramedial approach. Area Prepped: Entire Posterior Lumbar Region Prepping solution: ChloraPrep (2% chlorhexidine gluconate and 70% isopropyl alcohol) Safety Precautions: Aspiration looking for blood return was conducted prior to all injections. At no point did we inject any substances, as a needle was being advanced. No attempts were made at seeking any paresthesias. Safe injection practices and needle disposal techniques used. Medications properly checked for expiration dates. SDV (single dose vial) medications used. Description of the Procedure: Protocol guidelines were followed. The procedure needle was  introduced through the skin, ipsilateral to the reported pain, and advanced to the target area. Bone was contacted and the needle walked caudad, until the lamina was cleared. The epidural space was identified using "loss-of-resistance technique" with 2-3 ml of PF-NaCl (0.9% NSS), in a 5cc LOR glass syringe. Vitals:   08/20/18 0940 08/20/18 0950 08/20/18 0959 08/20/18  1010  BP: (!) 172/118 (!) 193/117 (!) 178/111 (!) 171/105  Pulse: (!) 58     Resp: 19 18 18 18   Temp:  98 F (36.7 C)    TempSrc:      SpO2: 100% 94% 94% 94%  Weight:      Height:        Start Time: 0935 hrs. End Time: 0941 hrs. Materials:  Needle(s) Type: Epidural needle Gauge: 17G Length: 3.5-in Medication(s): Please see orders for medications and dosing details. 10 cc solution: 7 cc of PF Saline, 2 cc of 0.2% Ropivacaine, 1 cc 10 mg/cc Imaging Guidance (Spinal):          Type of Imaging Technique: Fluoroscopy Guidance (Spinal) Indication(s): Assistance in needle guidance and placement for procedures requiring needle placement in or near specific anatomical locations not easily accessible without such assistance. Exposure Time: Please see nurses notes. Contrast: Before injecting any contrast, we confirmed that the patient did not have an allergy to iodine, shellfish, or radiological contrast. Once satisfactory needle placement was completed at the desired level, radiological contrast was injected. Contrast injected under live fluoroscopy. No contrast complications. See chart for type and volume of contrast used. Fluoroscopic Guidance: I was personally present during the use of fluoroscopy. "Tunnel Vision Technique" used to obtain the best possible view of the target area. Parallax error corrected before commencing the procedure. "Direction-depth-direction" technique used to introduce the needle under continuous pulsed fluoroscopy. Once target was reached, antero-posterior, oblique, and lateral fluoroscopic projection used  confirm needle placement in all planes. Images permanently stored in EMR. Interpretation: I personally interpreted the imaging intraoperatively. Adequate needle placement confirmed in multiple planes. Appropriate spread of contrast into desired area was observed. No evidence of afferent or efferent intravascular uptake. No intrathecal or subarachnoid spread observed. Permanent images saved into the patient's record.  Antibiotic Prophylaxis:   Anti-infectives (From admission, onward)   None     Indication(s): None identified  Post-operative Assessment:  Post-procedure Vital Signs:  Pulse/HCG Rate: (!) 5862 Temp: 98 F (36.7 C) Resp: 18 BP: (!) 171/105 SpO2: 94 %  EBL: None  Complications: No immediate post-treatment complications observed by team, or reported by patient.  Note: The patient tolerated the entire procedure well. A repeat set of vitals were taken after the procedure and the patient was kept under observation following institutional policy, for this type of procedure. Post-procedural neurological assessment was performed, showing return to baseline, prior to discharge. The patient was provided with post-procedure discharge instructions, including a section on how to identify potential problems. Should any problems arise concerning this procedure, the patient was given instructions to immediately contact us, at any time, without hesitation. In any case, we plan to contact the patient by telephone for a follow-up status report regarding this interventional procedure.  Comments:  No additional relevant information. 5 out of 5 strength bilateral lower extremity: Plantar flexion, dorsiflexion, knee flexion, knee extension.  Plan of Care    Imaging Orders     DG C-Arm 1-60 John-No Report  Procedure Orders     Lumbar Epidural Injection  Medications ordered for procedure: Meds ordered this encounter  Medications  . lactated ringers infusion 1,000 mL  . fentaNYL (SUBLIMAZE)  injection 25-100 mcg    Make sure Narcan is available in the pyxis when using this medication. In the event of respiratory depression (RR< 8/John): Titrate NARCAN (naloxone) in increments of 0.1 to 0.2 mg IV at 2-3 minute intervals, until desired degree of reversal.  .  iopamidol (ISOVUE-M) 41 % intrathecal injection 10 mL  . ropivacaine (PF) 2 mg/mL (0.2%) (NAROPIN) injection 2 mL  . sodium chloride flush (NS) 0.9 % injection 2 mL  . lidocaine (XYLOCAINE) 2 % (with pres) injection 200 mg  . dexamethasone (DECADRON) injection 10 mg  . lactated ringers infusion 1,000 mL  . fentaNYL (SUBLIMAZE) injection 25-100 mcg    Make sure Narcan is available in the pyxis when using this medication. In the event of respiratory depression (RR< 8/John): Titrate NARCAN (naloxone) in increments of 0.1 to 0.2 mg IV at 2-3 minute intervals, until desired degree of reversal.  . iopamidol (ISOVUE-M) 41 % intrathecal injection 10 mL  . ropivacaine (PF) 2 mg/mL (0.2%) (NAROPIN) injection 2 mL  . sodium chloride flush (NS) 0.9 % injection 2 mL  . lidocaine (XYLOCAINE) 2 % (with pres) injection 200 mg  . dexamethasone (DECADRON) injection 10 mg   Medications administered: We administered lactated ringers, fentaNYL, sodium chloride flush, lidocaine, and dexamethasone.  See the medical record for exact dosing, route, and time of administration.  New Prescriptions   No medications on file   Disposition: Discharge home  Discharge Date & Time: 08/20/2018; 1011 hrs.   Physician-requested Follow-up: Return in about 3 weeks (around 09/10/2018) for Post Procedure Evaluation.  Future Appointments  Date Time Provider [place]  09/11/2018  2:30 PM Gillis Santa, MD [facility] None   Primary Care Physician: Arnetha Courser, MD Location: [facility] Out[facility] Pain Management Facility Note by: Gillis Santa, MD Date: 08/20/2018; Time: 1:04 PM  Disclaimer:  Medicine is not an exact science. The only guarantee in medicine is  that nothing is guaranteed. It is important to note that the decision to proceed with this intervention was based on the information collected from the patient. The Data and conclusions were drawn from the patient's questionnaire, the interview, and the physical examination. Because the information was provided in large part by the patient, it cannot be guaranteed that it has not been purposely or unconsciously manipulated. Every effort has been made to obtain as much relevant data as possible for this evaluation. It is important to note that the conclusions that lead to this procedure are derived in large part from the available data. Always take into account that the treatment will also be dependent on availability of resources and existing treatment guidelines, considered by other Pain Management Practitioners as being common knowledge and practice, at the time of the intervention. For Medico-Legal purposes, it is also important to point out that variation in procedural techniques and pharmacological choices are the acceptable norm. The indications, contraindications, technique, and results of the above procedure should only be interpreted and judged by a Board-Certified Interventional Pain Specialist with extensive familiarity and expertise in the same exact procedure and technique.

## 2018-08-21 ENCOUNTER — Telehealth: Payer: Self-pay | Admitting: *Deleted

## 2018-08-21 NOTE — Telephone Encounter (Signed)
No problems post procedure. 

## 2018-08-22 ENCOUNTER — Other Ambulatory Visit: Payer: Self-pay

## 2018-08-22 ENCOUNTER — Emergency Department: Payer: No Typology Code available for payment source

## 2018-08-22 ENCOUNTER — Inpatient Hospital Stay
Admission: EM | Admit: 2018-08-22 | Discharge: 2018-08-25 | DRG: 247 | Disposition: A | Discharge status: Home / Self Care | Payer: No Typology Code available for payment source | Attending: Internal Medicine | Admitting: Internal Medicine

## 2018-08-22 ENCOUNTER — Telehealth: Payer: Self-pay | Admitting: Student in an Organized Health Care Education/Training Program

## 2018-08-22 ENCOUNTER — Encounter: Payer: Self-pay | Admitting: Radiology

## 2018-08-22 DIAGNOSIS — I25110 Atherosclerotic heart disease of native coronary artery with unstable angina pectoris: Secondary | ICD-10-CM | POA: Diagnosis present

## 2018-08-22 DIAGNOSIS — F410 Panic disorder [episodic paroxysmal anxiety] without agoraphobia: Secondary | ICD-10-CM | POA: Diagnosis present

## 2018-08-22 DIAGNOSIS — Z87891 Personal history of nicotine dependence: Secondary | ICD-10-CM

## 2018-08-22 DIAGNOSIS — K760 Fatty (change of) liver, not elsewhere classified: Secondary | ICD-10-CM | POA: Diagnosis present

## 2018-08-22 DIAGNOSIS — Z7982 Long term (current) use of aspirin: Secondary | ICD-10-CM

## 2018-08-22 DIAGNOSIS — M48061 Spinal stenosis, lumbar region without neurogenic claudication: Secondary | ICD-10-CM | POA: Diagnosis present

## 2018-08-22 DIAGNOSIS — I214 Non-ST elevation (NSTEMI) myocardial infarction: Secondary | ICD-10-CM | POA: Diagnosis present

## 2018-08-22 DIAGNOSIS — I10 Essential (primary) hypertension: Secondary | ICD-10-CM | POA: Diagnosis present

## 2018-08-22 DIAGNOSIS — E119 Type 2 diabetes mellitus without complications: Secondary | ICD-10-CM | POA: Diagnosis present

## 2018-08-22 DIAGNOSIS — Z981 Arthrodesis status: Secondary | ICD-10-CM | POA: Diagnosis not present

## 2018-08-22 DIAGNOSIS — R000 Tachycardia, unspecified: Secondary | ICD-10-CM | POA: Diagnosis not present

## 2018-08-22 DIAGNOSIS — Z8249 Family history of ischemic heart disease and other diseases of the circulatory system: Secondary | ICD-10-CM | POA: Diagnosis not present

## 2018-08-22 DIAGNOSIS — Z79899 Other long term (current) drug therapy: Secondary | ICD-10-CM

## 2018-08-22 DIAGNOSIS — Z955 Presence of coronary angioplasty implant and graft: Secondary | ICD-10-CM | POA: Diagnosis not present

## 2018-08-22 DIAGNOSIS — G8929 Other chronic pain: Secondary | ICD-10-CM | POA: Diagnosis present

## 2018-08-22 DIAGNOSIS — Z794 Long term (current) use of insulin: Secondary | ICD-10-CM

## 2018-08-22 DIAGNOSIS — E785 Hyperlipidemia, unspecified: Secondary | ICD-10-CM | POA: Diagnosis present

## 2018-08-22 DIAGNOSIS — K219 Gastro-esophageal reflux disease without esophagitis: Secondary | ICD-10-CM | POA: Diagnosis present

## 2018-08-22 LAB — COMPREHENSIVE METABOLIC PANEL
ALK PHOS: 66 U/L (ref 38–126)
ALT: 121 U/L — AB (ref 0–44)
AST: 211 U/L — AB (ref 15–41)
Albumin: 4.9 g/dL (ref 3.5–5.0)
Anion gap: 13 (ref 5–15)
BILIRUBIN TOTAL: 1.1 mg/dL (ref 0.3–1.2)
BUN: 19 mg/dL (ref 6–20)
CALCIUM: 9.9 mg/dL (ref 8.9–10.3)
CO2: 27 mmol/L (ref 22–32)
CREATININE: 1.16 mg/dL (ref 0.61–1.24)
Chloride: 100 mmol/L (ref 98–111)
GFR calc Af Amer: >60 mL/min (ref >60)
GFR calc non Af Amer: >60 mL/min (ref >60)
Glucose, Bld: 119 mg/dL — ABNORMAL HIGH (ref 70–99)
Potassium: 3.6 mmol/L (ref 3.5–5.1)
Sodium: 140 mmol/L (ref 135–145)
TOTAL PROTEIN: 7.9 g/dL (ref 6.5–8.1)

## 2018-08-22 LAB — PROTIME-INR
INR: 1.11
Prothrombin Time: 14.2 seconds (ref 11.4–15.2)

## 2018-08-22 LAB — CBC
HEMATOCRIT: 51.6 % (ref 40.0–52.0)
HEMOGLOBIN: 17.6 g/dL (ref 13.0–18.0)
MCH: 28.9 pg (ref 26.0–34.0)
MCHC: 34.1 g/dL (ref 32.0–36.0)
MCV: 84.7 fL (ref 80.0–100.0)
Platelets: 172 10*3/uL (ref 150–440)
RBC: 6.1 MIL/uL — ABNORMAL HIGH (ref 4.40–5.90)
RDW: 13.2 % (ref 11.5–14.5)
WBC: 12.8 10*3/uL — AB (ref 3.8–10.6)

## 2018-08-22 LAB — APTT: aPTT: 116 seconds — ABNORMAL HIGH (ref 24–36)

## 2018-08-22 LAB — GLUCOSE, CAPILLARY: Glucose-Capillary: 108 mg/dL — ABNORMAL HIGH (ref 70–99)

## 2018-08-22 LAB — TROPONIN I: Troponin I: 12.46 ng/mL (ref <0.03)

## 2018-08-22 MED ORDER — HEPARIN BOLUS VIA INFUSION
4000.0000 [IU] | Freq: Once | INTRAVENOUS | Status: AC
  Administered 2018-08-22: 4000 [IU] via INTRAVENOUS
  Filled 2018-08-22: qty 4000

## 2018-08-22 MED ORDER — NITROGLYCERIN 0.4 MG SL SUBL
SUBLINGUAL_TABLET | SUBLINGUAL | Status: AC
  Administered 2018-08-22: 0.4 mg via SUBLINGUAL
  Filled 2018-08-22: qty 2

## 2018-08-22 MED ORDER — NITROGLYCERIN IN D5W 200-5 MCG/ML-% IV SOLN
INTRAVENOUS | Status: AC
  Administered 2018-08-22: 40 ug/min via INTRAVENOUS
  Filled 2018-08-22: qty 250

## 2018-08-22 MED ORDER — HEPARIN (PORCINE) IN NACL 100-0.45 UNIT/ML-% IJ SOLN
1150.0000 [IU]/h | INTRAMUSCULAR | Status: DC
  Administered 2018-08-22: 1150 [IU]/h via INTRAVENOUS
  Filled 2018-08-22: qty 250

## 2018-08-22 MED ORDER — NITROGLYCERIN 0.4 MG SL SUBL
0.4000 mg | SUBLINGUAL_TABLET | SUBLINGUAL | Status: DC | PRN
  Administered 2018-08-22 – 2018-08-24 (×4): 0.4 mg via SUBLINGUAL
  Filled 2018-08-22: qty 1

## 2018-08-22 MED ORDER — METOPROLOL TARTRATE 50 MG PO TABS
50.0000 mg | ORAL_TABLET | Freq: Once | ORAL | Status: AC
  Administered 2018-08-22: 50 mg via ORAL
  Filled 2018-08-22: qty 1

## 2018-08-22 MED ORDER — LORAZEPAM 2 MG/ML IJ SOLN
1.0000 mg | Freq: Once | INTRAMUSCULAR | Status: AC
  Administered 2018-08-22: 1 mg via INTRAVENOUS

## 2018-08-22 MED ORDER — LORAZEPAM 2 MG/ML IJ SOLN
INTRAMUSCULAR | Status: AC
  Administered 2018-08-22: 1 mg via INTRAVENOUS
  Filled 2018-08-22: qty 1

## 2018-08-22 MED ORDER — NITROGLYCERIN 0.4 MG SL SUBL
SUBLINGUAL_TABLET | SUBLINGUAL | Status: AC
  Administered 2018-08-22: 0.4 mg via SUBLINGUAL
  Filled 2018-08-22: qty 1

## 2018-08-22 MED ORDER — SODIUM CHLORIDE 0.9 % IV BOLUS
1000.0000 mL | Freq: Once | INTRAVENOUS | Status: AC
  Administered 2018-08-22: 1000 mL via INTRAVENOUS

## 2018-08-22 MED ORDER — IOHEXOL 300 MG/ML  SOLN
75.0000 mL | Freq: Once | INTRAMUSCULAR | Status: AC | PRN
  Administered 2018-08-22: 75 mL via INTRAVENOUS

## 2018-08-22 MED ORDER — METOPROLOL TARTRATE 50 MG PO TABS
ORAL_TABLET | ORAL | Status: AC
  Filled 2018-08-22: qty 1

## 2018-08-22 MED ORDER — HEPARIN SODIUM (PORCINE) 5000 UNIT/ML IJ SOLN: 4000.0000 [IU] | Freq: Once | INTRAMUSCULAR | Status: DC

## 2018-08-22 MED ORDER — NITROGLYCERIN IN D5W 200-5 MCG/ML-% IV SOLN
0.0000 ug/min | Freq: Once | INTRAVENOUS | Status: AC
  Administered 2018-08-22: 40 ug/min via INTRAVENOUS

## 2018-08-22 MED ORDER — NITROGLYCERIN 2 % TD OINT
TOPICAL_OINTMENT | TRANSDERMAL | Status: AC
  Filled 2018-08-22: qty 1

## 2018-08-22 MED ORDER — ASPIRIN 81 MG PO CHEW
324.0000 mg | CHEWABLE_TABLET | Freq: Once | ORAL | Status: AC
  Administered 2018-08-22: 324 mg via ORAL
  Filled 2018-08-22: qty 4

## 2018-08-22 MED ORDER — NITROGLYCERIN 2 % TD OINT
1.0000 [in_us] | TOPICAL_OINTMENT | Freq: Once | TRANSDERMAL | Status: AC
  Administered 2018-08-22: 1 [in_us] via TOPICAL
  Filled 2018-08-22: qty 1

## 2018-08-22 NOTE — ED Notes (Signed)
Cardiologist at bedside.  

## 2018-08-22 NOTE — Telephone Encounter (Signed)
Pain is in upper chest and down both arms. Denies shortness of breath. Dr. Holley Raring informed. Advised patient that pain is probably related to LESI, to apply ice to lower back. Go to ED if necessary.

## 2018-08-22 NOTE — ED Notes (Signed)
Heparin verified with Sherrie RN

## 2018-08-22 NOTE — ED Notes (Signed)
Nitro paste removed by this RN d/t verbal order from Dr. Mariea Clonts. Will start nitro drip instead.

## 2018-08-22 NOTE — ED Notes (Signed)
Pt standing at bedside using urinal to pee. Asked ED staff to step out. Denied dizziness while standing.

## 2018-08-22 NOTE — ED Notes (Signed)
Pt placed on 2L [place] for comfort.

## 2018-08-22 NOTE — H&P (Signed)
[facility] NAME: John Hartman    MR#:  [id number]  DATE OF BIRTH:  06-Dec-1971  DATE OF ADMISSION:  08/22/2018  PRIMARY CARE PHYSICIAN: Arnetha Courser, MD   REQUESTING/REFERRING PHYSICIAN:   CHIEF COMPLAINT:   Chief Complaint  Patient presents with  . Chest Pain    HISTORY OF PRESENT ILLNESS: John Hartman  is a 47 y.o. male with a known history of diabetes type 2, fatty liver, former smoker, hypertension, hyperlipidemia, spinal stenosis and other comorbidities.  Patient also has strong family history of early coronary artery disease. He presented to emergency room for acute onset of severe, constant retrosternal chest pain with radiation to both arms.  This chest pain started yesterday and did not improve with narcotics or acupuncture.  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  The chest pain improved in the emergency room with nitroglycerin drip. No fever, chills, cough, no nausea, diaphoresis. Blood test done emergency room are notable for elevated WBC at 12.8 and elevated troponin level at 12.46. EKG shows normal sinus rhythm with heart rate at 76.  There is 1 mm ST depression in V2 and 0.25 millimeter ST depression in V3, but no STEMI. Chest x-ray is negative for any acute cardiopulmonary disease. CT scan of the chest with contrast is negative for any acute cardiopulmonary disease, but it shows coronary artery disease, aortic atherosclerosis and fatty infiltration of the liver.  Patient is admitted for further evaluation and treatment.  PAST MEDICAL HISTORY:   Past Medical History:  Diagnosis Date  . Angioedema   . Bronchitis   . DDD (degenerative disc disease), cervical   . Diabetes mellitus ([place])   . Fatty liver 03/31/2017   [place] April 2018  . GERD (gastroesophageal reflux disease)   .  Gout   . HTN (hypertension)   . Hypertension    CONTROLLED ON MEDS  . IBS (irritable bowel syndrome)   . Seasonal allergies   . Spinal stenosis     PAST SURGICAL HISTORY:  Past Surgical History:  Procedure Laterality Date  . ACDF with fusion  2007  . CARDIAC CATHETERIZATION Left 08/15/2016   Procedure: Left Heart Cath and Coronary Angiography;  Surgeon: Wellington Hampshire, MD;  Location: [facility];  Service: Cardiovascular;  Laterality: Left;  . COLONOSCOPY WITH PROPOFOL N/A 06/26/2015   Procedure: COLONOSCOPY WITH PROPOFOL;  Surgeon: Lucilla Lame, MD;  Location: [facility];  Service: Endoscopy;  Laterality: N/A;  with biopsies  . NASAL SEPTUM SURGERY  2004  . SHOULDER SURGERY Left 2007  . Testicular torsion  1980s  . VASECTOMY      SOCIAL HISTORY:  Social History   Tobacco Use  . Smoking status: Former Smoker    Packs/day: 1.00    Years: 10.00    Pack years: 10.00    Types: Cigarettes    Last attempt to quit: 04/15/2015    Years since quitting: 3.3  . Smokeless tobacco: Never Used  Substance Use Topics  . Alcohol use: No    Alcohol/week: 0.0 standard drinks    FAMILY HISTORY:  Family History  Problem Relation Age of Onset  . Breast cancer Mother   . Skin cancer Mother   . Arrhythmia Mother        A-fib  . Cancer Mother  breast, colon?, skin  . Hypertension Father   . Diabetes Father   . Cancer Maternal Grandfather   . Heart disease Brother        stent  . Allergies Son   . Alzheimer's disease Maternal Grandmother   . Stroke Neg Hx   . COPD Neg Hx     DRUG ALLERGIES:  Allergies  Allergen Reactions  . Lisinopril Swelling  . Ace Inhibitors Swelling    REVIEW OF SYSTEMS:   CONSTITUTIONAL: No fever, fatigue or weakness.  EYES: No changes in vision.  EARS, NOSE, AND THROAT: No tinnitus or ear pain.  RESPIRATORY: No cough, shortness of breath, wheezing or hemoptysis.  CARDIOVASCULAR: Positive for severe chest pain, no orthopnea,  edema.  GASTROINTESTINAL: No nausea, vomiting, diarrhea or abdominal pain.  GENITOURINARY: No dysuria, hematuria.  ENDOCRINE: No polyuria, nocturia. HEMATOLOGY: No bleeding. SKIN: No rash or lesion. MUSCULOSKELETAL: No joint pain at this time.   NEUROLOGIC: No focal weakness.  PSYCHIATRY: Positive for anxiety.  MEDICATIONS AT HOME:  Prior to Admission medications   Medication Sig Start Date End Date Taking? Authorizing Provider  allopurinol (ZYLOPRIM) 100 MG tablet Take 100 mg by mouth every morning.    Yes [provider]  aspirin EC 81 MG tablet Take 81 mg by mouth daily.   Yes [provider]  atenolol (TENORMIN) 50 MG tablet Take 1 tablet (50 mg total) by mouth daily. 09/22/17  Yes Lada, Satira Anis, MD  [place] alk-PHENObarbital (DONNATAL) 16.2 MG tablet Take 1 tablet by mouth as needed (pain).   Yes [provider]  cetirizine (ZYRTEC) 10 MG tablet Take 10 mg by mouth at bedtime.    Yes [provider]  colchicine 0.6 MG tablet Take 0.6 mg by mouth daily as needed (gout).    Yes [provider]  Dapagliflozin-metFORMIN HCl ER (XIGDUO XR) 04-999 MG TB24 Take 2 tablets by mouth daily.    Yes [provider]  diphenhydrAMINE (BENADRYL) 25 mg capsule Take 50 mg by mouth at bedtime.    Yes [provider]  hydrochlorothiazide (HYDRODIURIL) 12.5 MG tablet Take 12.5 mg by mouth daily.   Yes [provider]  indomethacin (INDOCIN) 25 MG capsule Take 25 mg by mouth 2 (two) times daily as needed for mild pain.   Yes [provider]  Insulin Degludec (TRESIBA FLEXTOUCH) 100 UNIT/ML SOPN Inject 34 Units into the skin at bedtime.  07/12/16  Yes Lada, Satira Anis, MD  Melatonin 5 MG TABS Take 5 mg by mouth at bedtime.    Yes [provider]  oxyCODONE-acetaminophen (PERCOCET) 7.5-325 MG tablet Take 1 tablet by mouth 3 (three) times daily as needed for severe pain. 08/09/18  Yes Gillis Santa, MD  pregabalin  (LYRICA) 50 MG capsule Take 1 capsule (50 mg total) by mouth 3 (three) times daily. 08/09/18  Yes Gillis Santa, MD  ranitidine (ZANTAC) 150 MG capsule Take 150 mg by mouth at bedtime.    Yes [provider]  tiZANidine (ZANAFLEX) 4 MG tablet Take 1 tablet (4 mg total) by mouth every 8 (eight) hours as needed for muscle spasms. 03/12/18  Yes Gillis Santa, MD  ACCU-CHEK SOFTCLIX LANCETS lancets USE AS DIRECTED 07/23/16   Lada, Satira Anis, MD  EPINEPHrine (EPIPEN 2-PAK) 0.3 mg/0.3 mL IJ SOAJ injection Inject 0.3 mg into the muscle once.    [provider]  glucose blood (ACCU-CHEK ACTIVE STRIPS) test strip Use as instructed 07/09/16   Lada, Satira Anis, MD  glucose  blood (ACCU-CHEK AVIVA PLUS) test strip Check FSBS three to four times per day 07/23/16   Arnetha Courser, MD  Insulin Pen Needle (B-D UF III MINI PEN NEEDLES) 31G X 5 MM MISC Once a day with insulin 07/09/16   Lada, Satira Anis, MD  lidocaine (LIDODERM) 5 % Place 1 patch onto the skin every 12 (twelve) hours. Remove & Discard patch within 12 hours or as directed by MD Patient not taking: Reported on 08/22/2018 06/25/18 06/25/19  Laban Emperor, PA-C      PHYSICAL EXAMINATION:   VITAL SIGNS: Blood pressure (!) 158/109, pulse 90, temperature 98.9 F (37.2 C), temperature source Oral, resp. rate 18, height '5\' 11"'  (1.803 m), weight 104.3 kg, SpO2 98 %.  GENERAL:  47 y.o.-year-old patient lying in the bed with moderate distress, secondary to chest pain.  He seems anxious, scared. EYES: Pupils equal, round, reactive to light and accommodation. No scleral icterus. Extraocular muscles intact.  HEENT: Head atraumatic, normocephalic. Oropharynx and nasopharynx clear.  NECK:  Supple, no jugular venous distention. No thyroid enlargement, no tenderness.  LUNGS: Reduced breath sounds bilaterally, no wheezing, rales,rhonchi or crepitation. No use of accessory muscles of respiration.  CARDIOVASCULAR: S1, S2 normal. No S3/S4.  ABDOMEN: Soft,  nontender, nondistended. Bowel sounds present. No organomegaly or mass.  EXTREMITIES: No pedal edema, cyanosis, or clubbing.  NEUROLOGIC: No focal weakness appreciated. PSYCHIATRIC: The patient is alert and oriented x 3.  SKIN: No obvious rash, lesion, or ulcer.   LABORATORY PANEL:   CBC Recent Labs  Lab 08/22/18 2052  WBC 12.8*  HGB 17.6  HCT 51.6  PLT 172  MCV 84.7  MCH 28.9  MCHC 34.1  RDW 13.2   ------------------------------------------------------------------------------------------------------------------  Chemistries  Recent Labs  Lab 08/22/18 2052  NA 140  K 3.6  CL 100  CO2 27  GLUCOSE 119*  BUN 19  CREATININE 1.16  CALCIUM 9.9  AST 211*  ALT 121*  ALKPHOS 66  BILITOT 1.1   ------------------------------------------------------------------------------------------------------------------ estimated creatinine clearance is 98.8 mL/min (by C-G formula based on SCr of 1.16 mg/dL). ------------------------------------------------------------------------------------------------------------------ No results for input(s): TSH, T4TOTAL, T3FREE, THYROIDAB in the last 72 hours.  Invalid input(s): FREET3   Coagulation profile Recent Labs  Lab 08/22/18 2252  INR 1.11   ------------------------------------------------------------------------------------------------------------------- No results for input(s): DDIMER in the last 72 hours. -------------------------------------------------------------------------------------------------------------------  Cardiac Enzymes Recent Labs  Lab 08/22/18 2052  TROPONINI 12.46*   ------------------------------------------------------------------------------------------------------------------ Invalid input(s): POCBNP  ---------------------------------------------------------------------------------------------------------------  Urinalysis    Component Value Date/Time   COLORURINE STRAW (A) 07/11/2016 1454    APPEARANCEUR CLEAR 07/11/2016 1454   LABSPEC 1.010 07/11/2016 1454   PHURINE 5.0 07/11/2016 1454   GLUCOSEU >1000 (A) 07/11/2016 1454   HGBUR NEGATIVE 07/11/2016 1454   BILIRUBINUR NEGATIVE 07/11/2016 1454   KETONESUR 15 (A) 07/11/2016 1454   PROTEINUR NEGATIVE 07/11/2016 1454   NITRITE NEGATIVE 07/11/2016 1454   LEUKOCYTESUR NEGATIVE 07/11/2016 1454     RADIOLOGY: Dg Chest 2 View  Result Date: 08/22/2018 CLINICAL DATA:  Generalized chest pain beginning yesterday. EXAM: CHEST - 2 VIEW COMPARISON:  Chest radiograph-07/17/2008; chest CT-07/18/2008; lumbar spine radiographs-01/02/2018 FINDINGS: Grossly unchanged cardiac silhouette and mediastinal contours. Bilateral infrahilar heterogeneous opacities are unchanged and favored to represent atelectasis. No new focal airspace opacities. No pleural effusion or pneumothorax. No evidence of edema. No acute osseus abnormalities. Post lower cervical ACDF, incompletely evaluated. Degenerative change of the superior aspect of the lumbar spine, incompletely evaluated though grossly similar to lumbar spine radiographs performed 12/2017. IMPRESSION:  No acute cardiopulmonary disease. Electronically Signed   By: Sandi Mariscal M.D.   On: 08/22/2018 21:02   Ct Chest W Contrast  Result Date: 08/22/2018 CLINICAL DATA:  Generalized chest pain EXAM: CT CHEST WITH CONTRAST TECHNIQUE: Multidetector CT imaging of the chest was performed during intravenous contrast administration. CONTRAST:  60m OMNIPAQUE IOHEXOL 300 MG/ML  SOLN COMPARISON:  Chest x-ray today.  Chest CT 07/18/2008 FINDINGS: Cardiovascular: Scattered aortic calcifications and coronary artery calcifications. Heart is normal size. Aorta is normal caliber. Mediastinum/Nodes: No mediastinal, hilar, or axillary adenopathy. Lungs/Pleura: Lungs are clear. No focal airspace opacities or suspicious nodules. No effusions. Upper Abdomen: Diffuse fatty infiltration of the liver. No acute findings Musculoskeletal: Chest  wall soft tissues are unremarkable. No acute bony abnormality. IMPRESSION: No acute cardiopulmonary disease. Coronary artery disease. Fatty infiltration of the liver. Aortic Atherosclerosis (ICD10-I70.0). Electronically Signed   By: KRolm BaptiseM.D.   On: 08/22/2018 23:38    EKG: Orders placed or performed during the hospital encounter of 08/22/18  . EKG 12-Lead  . EKG 12-Lead  . ED EKG  . ED EKG    IMPRESSION AND PLAN:   1. NSTEMI.  We will start patient on heparin drip and nitroglycerin drip.  He was already given aspirin and metoprolol in the emergency room.  There is plan for cardiac cath per cardiology, likely in the morning or even sooner if any ST elevation will develop on the EKG.  We will continue to monitor patient on telemetry and repeat the EKG, will follow troponin levels. 2.  Hypertension, stable, on nitro drip. 3.  Diabetes type 2.  Will hold oral anticoagulants.  We will keep patient n.p.o. for now due to possible cardiac cath procedure.  Will monitor blood sugars before meals and at bedtime and use insulin treatment during the hospital stay. 4.  Fatty liver.  Liver enzymes are slightly elevated at 211 and 121.  Patient denies alcohol use.  We will continue to monitor liver function closely and avoid hepatotoxic medications.  All the records are reviewed and case discussed with ED provider. Management plans discussed with the patient, family and they are in agreement.  CODE STATUS: Full Code Status History    Date Active Date Inactive Code Status Order ID Comments User Context   08/27/2016 2244 08/28/2016 1722 Full Code [id number] Hugelmeyer, AUbaldo Glassing DO Inpatient       TOTAL TIME TAKING CARE OF THIS PATIENT: 45 minutes.    AAmelia JoM.D on 08/22/2018 at 11:53 PM  Between 7am to 6pm - Pager - [phone]  After 6pm go to [email or web address] - password [email or web address] [facility] [phone] CC: Primary care  physician; LArnetha Courser MD

## 2018-08-22 NOTE — ED Provider Notes (Addendum)
[facility] Emergency Department Provider Note  ____________________________________________  Time seen: Approximately 10:13 PM  I have reviewed the triage vital signs and the nursing notes.   HISTORY  Chief Complaint Chest Pain    HPI John Hartman is a 47 y.o. male history of HTN, HL that is currently untreated, DM, and strong family history of early CAD presenting with chest pain.  The patient reports that 3 days ago, he had a steroid injection between L4 and L5 and has continued to have significant back pain.  Yesterday, the patient was at rest when he developed a severe chest pain that has been constant.  He had radiation into both arms but denies any shortness of breath, diaphoresis, nausea and vomiting.  He called his pain physician, who recommended that he take pain medication as he felt it was most likely due to back spasm.  The patient reports that the pain slightly improved, but then came back to it.  Today, the patient decided to try acupuncture for his pain, and his pain persisted.  Upon arrival to the emergency department, the patient is describing the worst pain that he has ever experienced.  He is tearful, hypertensive, and very uncomfortable.  SH: Denies tobacco, cocaine or erectile dysfunction drugs.  FH: Brother with stent placement at age 38.  Past Medical History:  Diagnosis Date  . Angioedema   . Bronchitis   . DDD (degenerative disc disease), cervical   . Diabetes mellitus ([place])   . Fatty liver 03/31/2017   [place] April 2018  . GERD (gastroesophageal reflux disease)   . Gout   . HTN (hypertension)   . Hypertension    CONTROLLED ON MEDS  . IBS (irritable bowel syndrome)   . Seasonal allergies   . Spinal stenosis     Patient Active Problem List   Diagnosis Date Noted  . Elevated hemoglobin ([place]) 08/11/2017  . Chronic lower back pain 08/04/2017  . Degenerative disc disease, lumbar 08/04/2017  . 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  Calcification of abdominal aorta (HCC) 08/04/2017  . Fatty liver 03/31/2017  . Elevated liver enzymes 02/11/2017  . Serum total bilirubin elevated 02/11/2017  . Hyperlipidemia LDL goal <70 01/04/2017  . Elevated serum glutamic pyruvic transaminase (SGPT) level 01/04/2017  . Angioedema   . Encounter for medication monitoring 08/25/2016  . Coronary artery disease 08/25/2016  . Diabetes mellitus ([place])   . Chest pain 08/08/2016  . Preventative health care 08/02/2016  . Ketonuria 07/12/2016  . Glucosuria 07/12/2016  . 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 Palpitations 08/12/2015  . Family history of malignant neoplasm of gastrointestinal tract   . Benign neoplasm of rectosigmoid junction   . Essential hypertension 05/28/2015  . Gout 05/28/2015  . IBS (irritable bowel syndrome) 05/28/2015    Past Surgical History:  Procedure Laterality Date  . ACDF with fusion  2007  . CARDIAC CATHETERIZATION Left 08/15/2016   Procedure: Left Heart Cath and Coronary Angiography;  Surgeon: Wellington Hampshire, MD;  Location: [place] [facility];  Service: Cardiovascular;  Laterality: Left;  . COLONOSCOPY WITH PROPOFOL N/A 06/26/2015   Procedure: COLONOSCOPY WITH PROPOFOL;  Surgeon: Lucilla Lame, MD;  Location: [place];  Service: Endoscopy;  Laterality: N/A;  with biopsies  . NASAL SEPTUM SURGERY  2004  . SHOULDER SURGERY Left 2007  . Testicular torsion  1980s  . VASECTOMY      Current Outpatient Rx  . Order #: [id number] Class: Historical Med  . Order #: [id number] Class: Historical  Med  . Order #: [id number] Class: Normal  . Order #: [id number] Class: Historical Med  . Order #: [id number] Class: Historical Med  . Order #: [id number] Class: Historical Med  . Order #: [id number] Class: Historical Med  . Order #: [id number] Class: Historical Med  . Order #: [id number] Class: Historical Med  . Order #: [id number] Class: Historical Med  . Order #: [id number] Class: Historical Med  . Order #: [id number] Class:  Historical Med  . Order #: [id number] Class: Normal  . Order #: [id number] Class: Normal  . Order #: [id number] Class: Historical Med  . Order #: [id number] Class: Normal  . Order #: [id number] Class: Normal  . Order #: [id number] Class: Historical Med  . Order #: [id number] Class: Normal  . Order #: [id number] Class: Normal  . Order #: [id number] Class: Normal  . Order #: [id number] Class: Print    Allergies Lisinopril and Ace inhibitors  Family History  Problem Relation Age of Onset  . Breast cancer Mother   . Skin cancer Mother   . Arrhythmia Mother        A-fib  . Cancer Mother        breast, colon?, skin  . Hypertension Father   . Diabetes Father   . Cancer Maternal Grandfather   . Heart disease Brother        stent  . Allergies Son   . Alzheimer's disease Maternal Grandmother   . Stroke Neg Hx   . COPD Neg Hx     Social History Social History   Tobacco Use  . Smoking status: Former Smoker    Packs/day: 1.00    Years: 10.00    Pack years: 10.00    Types: Cigarettes    Last attempt to quit: 04/15/2015    Years since quitting: 3.3  . Smokeless tobacco: Never Used  Substance Use Topics  . Alcohol use: No    Alcohol/week: 0.0 standard drinks  . Drug use: No    Review of Systems Constitutional: No fever/chills.  No lightheadedness or syncope.  No diaphoresis. Eyes: No visual changes. ENT: No sore throat. No congestion or rhinorrhea. Cardiovascular: Positive severe chest pain. Denies palpitations. Respiratory: Denies shortness of breath.  No cough. Gastrointestinal: No abdominal pain.  No nausea, no vomiting.  No diarrhea.  No constipation. Genitourinary: Negative for dysuria. Musculoskeletal: Positive for back pain. Skin: Negative for rash. Neurological: Negative for headaches. No focal numbness, tingling or weakness.  Psychiatric:Positive anxiety. Endocrine: Hematological/Lymphatic: Allergic/Immunilogical:  **}   ____________________________________________   PHYSICAL EXAM:  VITAL SIGNS: ED Triage Vitals  Enc Vitals Group     BP 08/22/18 2046 (!) 159/106     Pulse Rate 08/22/18 2046 81     Resp 08/22/18 2046 18     Temp 08/22/18 2046 98.9 F (37.2 C)     Temp Source 08/22/18 2046 Oral     SpO2 08/22/18 2046 100 %     Weight 08/22/18 2047 230 lb (104.3 kg)     Height 08/22/18 2047 5\' 11"  (1.803 m)     Head Circumference --      Peak Flow --      Pain Score 08/22/18 2046 9     Pain Loc --      Pain Edu? --      Excl. in [place]? --     Constitutional: Alert and oriented. Answers questions appropriately.  Patient is tearful and having severe pain. Eyes: Conjunctivae are normal.  EOMI. No scleral icterus. Head: Atraumatic. Nose: No congestion/rhinnorhea. Mouth/Throat: Mucous membranes are moist.  Neck: No stridor.  Supple.  +  JVD. Cardiovascular: Normal rate, regular rhythm. No murmurs, rubs or gallops.  Respiratory: Normal respiratory effort.  No accessory muscle use or retractions. Lungs CTAB.  No wheezes, rales or ronchi. Gastrointestinal: Soft, nontender and nondistended.  No guarding or rebound.  No peritoneal signs. Musculoskeletal: No LE edema. No ttp in the calves or palpable cords.  Negative Homan's sign. Neurologic:  A&Ox3.  Speech is clear.  Face and smile are symmetric.  EOMI.  Moves all extremities well. Skin:  Skin is warm, dry and intact. No rash noted. Psychiatric: Mood and affect are normal. Speech and behavior are normal.  Normal judgement.  ____________________________________________   LABS (all labs ordered are listed, but only abnormal results are displayed)  Labs Reviewed  CBC - Abnormal; Notable for the following components:      Result Value   WBC 12.8 (*)    RBC 6.10 (*)    All other components within normal limits  COMPREHENSIVE METABOLIC PANEL - Abnormal; Notable for the following components:   Glucose, Bld 119 (*)    AST 211 (*)    ALT 121 (*)     All other components within normal limits  TROPONIN I - Abnormal; Notable for the following components:   Troponin I 12.46 (*)    All other components within normal limits  GLUCOSE, CAPILLARY - Abnormal; Notable for the following components:   Glucose-Capillary 108 (*)    All other components within normal limits  HEPARIN LEVEL (UNFRACTIONATED)  PROTIME-INR  APTT   ____________________________________________  EKG  ED ECG REPORT I, Anne-Caroline Mariea Clonts, the attending physician, personally viewed and interpreted this ECG.   Date: 08/22/2018  EKG Time: 2045  Rate: 76  Rhythm: normal sinus rhythm  Axis: leftward  Intervals:none  ST&T Change: No STEMI: The patient does have 1 mm ST depressions in V2 and 0.25 mm ST depression in V3.  Repeat EKG: ED ECG REPORT I, Anne-Caroline Mariea Clonts, the attending physician, personally viewed and interpreted this ECG.   Date: 08/22/2018  EKG Time: 2156  Rate: 78  Rhythm: normal sinus rhythm  Axis: Leftward  Intervals:none  ST&T Change: Unspecific T wave inversion in V1.  0.5 mm ST depression in V2.  No STEMI.   Repeat EKG: ED ECG REPORT I, Anne-Caroline Mariea Clonts, the attending physician, personally viewed and interpreted this ECG.   Date: 08/22/2018  EKG Time: 2158  Rate: 66  Rhythm: normal sinus rhythm  Axis: Leftward  Intervals:none  ST&T Change: Nonspecific T wave inversion in V1.  0.25 mm ST depression in V2 and V3.  No STEMI.    ____________________________________________  RADIOLOGY  Dg Chest 2 View  Result Date: 08/22/2018 CLINICAL DATA:  Generalized chest pain beginning yesterday. EXAM: CHEST - 2 VIEW COMPARISON:  Chest radiograph-07/17/2008; chest CT-07/18/2008; lumbar spine radiographs-01/02/2018 FINDINGS: Grossly unchanged cardiac silhouette and mediastinal contours. Bilateral infrahilar heterogeneous opacities are unchanged and favored to represent atelectasis. No new focal airspace opacities. No pleural effusion or  pneumothorax. No evidence of edema. No acute osseus abnormalities. Post lower cervical ACDF, incompletely evaluated. Degenerative change of the superior aspect of the lumbar spine, incompletely evaluated though grossly similar to lumbar spine radiographs performed 12/2017. IMPRESSION: No acute cardiopulmonary disease. Electronically Signed   By: Sandi Mariscal M.D.   On: 08/22/2018 21:02    ____________________________________________   PROCEDURES  Procedure(s) performed: None  Procedures  Critical Care performed: Yes, see critical care note(s) ____________________________________________   INITIAL IMPRESSION / ASSESSMENT AND PLAN / ED COURSE  Pertinent labs & imaging  results that were available during my care of the patient were reviewed by me and considered in my medical decision making (see chart for details).  47 y.o. with a history of HTN, HL, DM, and family history's of early CAD presenting with chest pain.  Overall, the patient is hypertensive to the 160s over 110s.  He is having ongoing severe chest pain, which is also associated with some anxiety and tearfulness.  I am concerned about the ST depressions that I am seeing on his EKG and he has a troponin today of 12.46.  IV access has been established and the patient is receiving sublingual nitroglycerin.  I have also ordered a heparin bolus and drip.  I have talked with Dr. Laverle Patter shows, the cardiologist on-call, who will come in to evaluate the patient.  He does not meet STEMI criteria, but with his ongoing pain and significantly elevated troponin, I am concerned about an NSTEMI today.  The patient is also been experiencing back pain which is typical of his prior back pain.  He has not had any pain between shoulder blades or any tearing sensations.  Aortic pathology is much less likely, although it is on the differential.  Aspirin has been ordered.  The patient will require admission for continued evaluation and  treatment.  ----------------------------------------- 10:35 PM on 08/22/2018 -----------------------------------------  At this time, the patient's pain has improved.  He now has 3 out of 10 pain after 2 sublingual nitroglycerin.  Nitroglycerin paste has been ordered, and if he does not have complete resolution of his chest pain, we will put him on a nitroglycerin drip.  He has also received Ativan for his anxiety and this is improving.  ----------------------------------------- 10:41 PM on 08/22/2018 -----------------------------------------  Patient continues to have 3 out of 10 pain after 3 sublingual nitroglycerin and nitroglycerin paste.  At this time, the patient will be removed and the patient will be started on a nitroglycerin drip.  ----------------------------------------- 11:14 PM on 08/22/2018 -----------------------------------------  At this time, the patient continues to be on heparin and nitroglycerin, and has received aspirin and metoprolol.  He has been seen and evaluated by Dr. Christella Noa the cardiologist on-call, who will plan a cardiac catheterization for the morning.  A CT scan has been ordered to rule out aortic pathology given that the patient had concomitant back pain and will be followed up by Dr. Marjean Donna.  The patient will be admitted to the hospitalist for continued evaluation and treatment.   CRITICAL CARE Performed by: Eula Listen   Total critical care time: 50 minutes  Critical care time was exclusive of separately billable procedures and treating other patients.  Critical care was necessary to treat or prevent imminent or life-threatening deterioration.  Critical care was time spent personally by me on the following activities: development of treatment plan with patient and/or surrogate as well as nursing, discussions with consultants, evaluation of patient's response to treatment, examination of patient, obtaining history from patient or  surrogate, ordering and performing treatments and interventions, ordering and review of laboratory studies, ordering and review of radiographic studies, pulse oximetry and re-evaluation of patient's condition.   ____________________________________________  FINAL CLINICAL IMPRESSION(S) / ED DIAGNOSES  Final diagnoses:  NSTEMI (non-ST elevated myocardial infarction) ([place])  Panic attack         NEW MEDICATIONS STARTED DURING THIS VISIT:  New Prescriptions   No medications on file      Eula Listen, MD 08/22/18 2236    Eula Listen, MD 08/22/18 2241  Eula Listen, MD 08/22/18 [phone]

## 2018-08-22 NOTE — Consult Note (Signed)
[facility] Cardiology  CARDIOLOGY CONSULT NOTE  Patient ID: Arnet Hofferber MRN: [id number] DOB/AGE: [birth year] 47 y.o.  Admit date: 08/22/2018 Referring Physician Mariea Clonts Primary Physician Lada Primary Cardiologist  Reason for Consultation non-ST elevation myocardial infarction  HPI: 47 year old gentleman referred for evaluation of new onset chest pain, elevated troponin, consistent with non-ST elevation myocardial infarction.  The patient has been in his usual state of health until earlier this week, he received a cortisone injection and L4-L5 on 08/20/2018.  Patient continues to experience low back pain, then developed general chest discomfort approximately 7:00 PM on 08/21/2018 radiated across his chest, felt like a muscle spasm, emergently sharp in nature.  Patient took pain medications were given to him for low back pain with partial relief.  He awoke earlier today, persistent, intermittent chest discomfort, partially relieved with additional pain medications.  The patient felt that this was an extension of his muscle spasm in his low back, received acupuncture therapy without any additional relief of back or chest pain.  Patient reports that intermittently throughout the day chest pain is severe as 8-10 out of 10.  Resented to [facility] emergency room, where ECG was nondiagnostic with the exception of specific ST abnormalities in lead V2.  Labs were notable for elevated troponin of 12.46.  Patient was started on heparin and nitro drip, currently appears to be clinically improved, with mild residual chest pain, rated 2-3 out of 10.  The patient has undergone previous cardiac catheterization 08/15/2016 which revealed insignificant coronary artery disease, and normal left ventricular function.  Review of systems complete and found to be negative unless listed above     Past Medical History:  Diagnosis Date  . Angioedema   . Bronchitis   . DDD (degenerative disc disease), cervical   . Diabetes mellitus ([place])   .  Fatty liver 03/31/2017   [place] April 2018  . GERD (gastroesophageal reflux disease)   . Gout   . HTN (hypertension)   . Hypertension    CONTROLLED ON MEDS  . IBS (irritable bowel syndrome)   . Seasonal allergies   . Spinal stenosis     Past Surgical History:  Procedure Laterality Date  . ACDF with fusion  2007  . CARDIAC CATHETERIZATION Left 08/15/2016   Procedure: Left Heart Cath and Coronary Angiography;  Surgeon: Wellington Hampshire, MD;  Location: [place] [facility];  Service: Cardiovascular;  Laterality: Left;  . COLONOSCOPY WITH PROPOFOL N/A 06/26/2015   Procedure: COLONOSCOPY WITH PROPOFOL;  Surgeon: Lucilla Lame, MD;  Location: [place];  Service: Endoscopy;  Laterality: N/A;  with biopsies  . NASAL SEPTUM SURGERY  2004  . SHOULDER SURGERY Left 2007  . Testicular torsion  1980s  . VASECTOMY       (Not in a hospital admission) Social History   Socioeconomic History  . Marital status: Married    Spouse name: Not on file  . Number of children: Not on file  . Years of education: Not on file  . Highest education level: Not on file  Occupational History  . Not on file  Social Needs  . Financial resource strain: Not on file  . Food insecurity:    Worry: Not on file    Inability: Not on file  . Transportation needs:    Medical: Not on file    Non-medical: Not on file  Tobacco Use  . Smoking status: Former Smoker    Packs/day: 1.00    Years: 10.00    Pack years: 10.00  Types: Cigarettes    Last attempt to quit: 04/15/2015    Years since quitting: 3.3  . Smokeless tobacco: Never Used  Substance and Sexual Activity  . Alcohol use: No    Alcohol/week: 0.0 standard drinks  . Drug use: No  . Sexual activity: Yes  Lifestyle  . Physical activity:    Days per week: Not on file    Minutes per session: Not on file  . Stress: Not on file  Relationships  . Social connections:    Talks on phone: Not on file    Gets together: Not on file    Attends religious  service: Not on file    Active member of club or organization: Not on file    Attends meetings of clubs or organizations: Not on file    Relationship status: Not on file  . Intimate partner violence:    Fear of current or ex partner: Not on file    Emotionally abused: Not on file    Physically abused: Not on file    Forced sexual activity: Not on file  Other Topics Concern  . Not on file  Social History Narrative  . Not on file    Family History  Problem Relation Age of Onset  . Breast cancer Mother   . Skin cancer Mother   . Arrhythmia Mother        A-fib  . Cancer Mother        breast, colon?, skin  . Hypertension Father   . Diabetes Father   . Cancer Maternal Grandfather   . Heart disease Brother        stent  . Allergies Son   . Alzheimer's disease Maternal Grandmother   . Stroke Neg Hx   . COPD Neg Hx       Review of systems complete and found to be negative unless listed above      PHYSICAL EXAM  General: Well developed, well nourished, in no acute distress HEENT:  Normocephalic and atramatic Neck:  No JVD.  Lungs: Clear bilaterally to auscultation and percussion. Heart: HRRR . Normal S1 and S2 without gallops or murmurs.  Abdomen: Bowel sounds are positive, abdomen soft and non-tender  Msk:  Back normal, normal gait. Normal strength and tone for age. Extremities: No clubbing, cyanosis or edema.   Neuro: Alert and oriented X 3. Psych:  Good affect, responds appropriately  Labs:   Lab Results  Component Value Date   WBC 12.8 (H) 08/22/2018   HGB 17.6 08/22/2018   HCT 51.6 08/22/2018   MCV 84.7 08/22/2018   PLT 172 08/22/2018   Recent Labs  Lab 08/22/18 2052  NA 140  K 3.6  CL 100  CO2 27  BUN 19  CREATININE 1.16  CALCIUM 9.9  PROT 7.9  BILITOT 1.1  ALKPHOS 66  ALT 121*  AST 211*  GLUCOSE 119*   Lab Results  Component Value Date   TROPONINI 12.46 (HH) 08/22/2018    Lab Results  Component Value Date   CHOL 140 08/04/2017   CHOL  123 02/10/2017   CHOL 135 12/28/2016   Lab Results  Component Value Date   HDL 34 (L) 08/04/2017   HDL 29 (L) 02/10/2017   HDL 30 (L) 12/28/2016   Lab Results  Component Value Date   LDLCALC 88 08/04/2017   LDLCALC 68 02/10/2017   LDLCALC 86 12/28/2016   Lab Results  Component Value Date   TRIG 90 08/04/2017   TRIG 132  02/10/2017   TRIG 96 12/28/2016   Lab Results  Component Value Date   CHOLHDL 4.1 08/04/2017   CHOLHDL 4.2 02/10/2017   CHOLHDL 4.5 12/28/2016   No results found for: LDLDIRECT    Radiology: Dg Chest 2 View  Result Date: 08/22/2018 CLINICAL DATA:  Generalized chest pain beginning yesterday. EXAM: CHEST - 2 VIEW COMPARISON:  Chest radiograph-07/17/2008; chest CT-07/18/2008; lumbar spine radiographs-01/02/2018 FINDINGS: Grossly unchanged cardiac silhouette and mediastinal contours. Bilateral infrahilar heterogeneous opacities are unchanged and favored to represent atelectasis. No new focal airspace opacities. No pleural effusion or pneumothorax. No evidence of edema. No acute osseus abnormalities. Post lower cervical ACDF, incompletely evaluated. Degenerative change of the superior aspect of the lumbar spine, incompletely evaluated though grossly similar to lumbar spine radiographs performed 12/2017. IMPRESSION: No acute cardiopulmonary disease. Electronically Signed   By: Sandi Mariscal M.D.   On: 08/22/2018 21:02   Mr Lumbar Spine Wo Contrast  Result Date: 08/01/2018 CLINICAL DATA:  Chronic low back pain, worse last month. Lumbar radiculopathy EXAM: MRI LUMBAR SPINE WITHOUT CONTRAST TECHNIQUE: Multiplanar, multisequence MR imaging of the lumbar spine was performed. No intravenous contrast was administered. COMPARISON:  Radiography 01/02/2018 5 lumbar type vertebral bodies FINDINGS: Segmentation:  5 lumbar type vertebral bodies Alignment: Reversal of normal lumbar lordosis at L1-2. Slight retrolisthesis at L1-2, L2-3, and L4-5. Vertebrae: Degenerative endplate marrow  alteration at L1-2. No fracture, discitis, or aggressive bone lesion Conus medullaris and cauda equina: Conus extends to the L1 level. Conus and cauda equina appear normal. Paraspinal and other soft tissues: Negative Disc levels: T12- L1: Unremarkable. L1-L2: Spondylosis and disc narrowing with endplate degeneration. Central disc protrusion at an annular fissure with ventral thecal sac flattening. Spinal stenosis is mild. L2-L3: Spondylosis and disc narrowing with bulge and annular fissure. Ventral thecal sac flattening. Spinal stenosis is mild. L3-L4: Disc narrowing and mild bulging. Bilateral foraminal to extra-foraminal protrusion, larger on the left, which could irritate the extraforaminal L3 nerve roots. Mild spinal stenosis with ventral thecal sac flattening L4-L5: Disc narrowing and bulging with a broad central down turning protrusion. Spinal stenosis is moderate. Mild to moderate left foraminal narrowing. L5-S1:Mild facet spurring and disc bulging. Posterior annular fissures. IMPRESSION: 1. Multilevel degenerative disc bulge and protrusion as described. 2. Spinal stenosis is moderate at L4-5 and mild at L1-2, L2-3, and L3-4. 3. L3-4 left larger than right extraforaminal disc protrusion which could irritate the bilateral L3 nerve roots. 4. Mild-to-moderate L4-5 left foraminal narrowing. Electronically Signed   By: Monte Fantasia M.D.   On: 08/01/2018 16:01   Dg C-arm 1-60 Min-no Report  Result Date: 08/20/2018 Fluoroscopy was utilized by the requesting physician.  No radiographic interpretation.    EKG: Sinus rhythm, nonspecific ST abnormality lead V2  ASSESSMENT AND PLAN:   1.  Chest pain with atypical features, nondiagnostic ECG, elevated troponin, consistent with non-ST elevation myocardial infarction.  His pain has improved on heparin and nitro drip.  Recommendations  1.  Agree with current therapy 2.  Continue heparin drip 3.  Continue nitro drip, titrate as needed to relieve chest  pain 4.  Cycle troponin 5.  Cardiac catheterization with selective coronary arteriography scheduled for the a.m.  The risks, benefits and alternatives cardiac catheterization and possible PCI were explained to the patient and informed consent was obtained.   Signed: Isaias Cowman MD,PhD, [facility] 08/22/2018, 10:56 PM

## 2018-08-22 NOTE — ED Triage Notes (Signed)
Pt in with co generalized chest pain that started yesterday. States no cardiac history does have chronic back pain which he had a spinal steroid epidural on Monday.

## 2018-08-22 NOTE — Consult Note (Signed)
ANTICOAGULATION CONSULT NOTE - Initial Consult  Pharmacy Consult for Heparin Drip Dosing and Monitoring  Indication: chest pain/ACS  Allergies  Allergen Reactions  . Lisinopril Swelling  . Ace Inhibitors Swelling    Patient Measurements: Height: 5\' 11"  (180.3 cm) Weight: 230 lb (104.3 kg) IBW/kg (Calculated) : 75.3 Heparin Dosing Weight: 97 Kg  Vital Signs: Temp: 98.9 F (37.2 C) (08/28 2046) Temp Source: Oral (08/28 2046) BP: 159/106 (08/28 2046) Pulse Rate: 81 (08/28 2046)  Labs: Recent Labs    08/22/18 2052  HGB 17.6  HCT 51.6  PLT 172  CREATININE 1.16  TROPONINI 12.46*    Estimated Creatinine Clearance: 98.8 mL/min (by C-G formula based on SCr of 1.16 mg/dL).   Medical History: Past Medical History:  Diagnosis Date  . Angioedema   . Bronchitis   . DDD (degenerative disc disease), cervical   . Diabetes mellitus ([place])   . Fatty liver 03/31/2017   [place] April 2018  . GERD (gastroesophageal reflux disease)   . Gout   . HTN (hypertension)   . Hypertension    CONTROLLED ON MEDS  . IBS (irritable bowel syndrome)   . Seasonal allergies   . Spinal stenosis     Assessment: Pharmacy consulted for heparin dosing and monitoring for 47yo male admitted with  ACS/NSTEMI. Troponin = 12.46  Goal of Therapy:  Heparin level 0.3-0.7 units/ml Monitor platelets by anticoagulation protocol: Yes   Plan:  Baseline labs ordered Give 4000 units bolus x 1 Start heparin infusion at 1150 units/hr Check anti-Xa level in 6 hours and daily while on heparin Continue to monitor H&H and platelets  Pernell Dupre, PharmD, BCPS Clinical Pharmacist 08/22/2018 10:20 PM

## 2018-08-22 NOTE — Telephone Encounter (Signed)
Patient had procedure on Monday, did well until Tues night, started having severe muscle spasms in back and across chest down both arms. Sent call to [facility]

## 2018-08-22 NOTE — ED Notes (Signed)
Date and time results received: 08/22/18 2158  Test: troponin Critical Value: 12.46  Name of Provider Notified: Dr. Mariea Clonts   Orders Received? Or Actions Taken?: see chart

## 2018-08-22 NOTE — ED Notes (Signed)
Pt states that his wife convinced him to try acupuncture today and pt states that they were rubbing on his chest. States he felt a lot worse after wards.

## 2018-08-22 NOTE — ED Notes (Signed)
Pt states central CP that feels like his chest is being ripped open. Also c/o back pain, chronic back pain (DDD, nerve impingement, arthritis, etc). Pt states steroid shot in back on Monday, was fine after that. Type 2 DM. Appears uncomfortable.

## 2018-08-23 ENCOUNTER — Encounter: Payer: Self-pay | Admitting: Cardiology

## 2018-08-23 ENCOUNTER — Other Ambulatory Visit: Payer: Self-pay

## 2018-08-23 ENCOUNTER — Encounter
Admission: EM | Disposition: A | Discharge status: Home / Self Care | Payer: Self-pay | Source: Home / Self Care | Attending: Internal Medicine

## 2018-08-23 ENCOUNTER — Other Ambulatory Visit (HOSPITAL_COMMUNITY): Payer: Self-pay

## 2018-08-23 DIAGNOSIS — Z955 Presence of coronary angioplasty implant and graft: Secondary | ICD-10-CM

## 2018-08-23 DIAGNOSIS — I219 Acute myocardial infarction, unspecified: Secondary | ICD-10-CM

## 2018-08-23 DIAGNOSIS — R000 Tachycardia, unspecified: Secondary | ICD-10-CM

## 2018-08-23 HISTORY — PX: LEFT HEART CATH AND CORONARY ANGIOGRAPHY: CATH118249

## 2018-08-23 HISTORY — DX: Acute myocardial infarction, unspecified: I21.9

## 2018-08-23 HISTORY — PX: CORONARY/GRAFT ACUTE MI REVASCULARIZATION: CATH118305

## 2018-08-23 LAB — BASIC METABOLIC PANEL
Anion gap: 12 (ref 5–15)
BUN: 16 mg/dL (ref 6–20)
CALCIUM: 8.7 mg/dL — AB (ref 8.9–10.3)
CO2: 25 mmol/L (ref 22–32)
CREATININE: 0.88 mg/dL (ref 0.61–1.24)
Chloride: 100 mmol/L (ref 98–111)
Glucose, Bld: 138 mg/dL — ABNORMAL HIGH (ref 70–99)
Potassium: 3.6 mmol/L (ref 3.5–5.1)
Sodium: 137 mmol/L (ref 135–145)

## 2018-08-23 LAB — URINE DRUG SCREEN, QUALITATIVE (ARMC ONLY)
Amphetamines, Ur Screen: NOT DETECTED
BARBITURATES, UR SCREEN: NOT DETECTED
COCAINE METABOLITE, UR ~~LOC~~: NOT DETECTED
Cannabinoid 50 Ng, Ur ~~LOC~~: NOT DETECTED
MDMA (Ecstasy)Ur Screen: NOT DETECTED
Methadone Scn, Ur: NOT DETECTED
Opiate, Ur Screen: POSITIVE — AB
PHENCYCLIDINE (PCP) UR S: NOT DETECTED
Tricyclic, Ur Screen: NOT DETECTED

## 2018-08-23 LAB — TROPONIN I
TROPONIN I: 16.49 ng/mL — AB (ref <0.03)
Troponin I: 9.58 ng/mL (ref <0.03)
Troponin I: 21.6 ng/mL (ref <0.03)

## 2018-08-23 LAB — GLUCOSE, CAPILLARY
GLUCOSE-CAPILLARY: 138 mg/dL — AB (ref 70–99)
GLUCOSE-CAPILLARY: 121 mg/dL — AB (ref 70–99)
Glucose-Capillary: 140 mg/dL — ABNORMAL HIGH (ref 70–99)
Glucose-Capillary: 142 mg/dL — ABNORMAL HIGH (ref 70–99)
Glucose-Capillary: 108 mg/dL — ABNORMAL HIGH (ref 70–99)

## 2018-08-23 LAB — CBC
HEMATOCRIT: 46.8 % (ref 40.0–52.0)
Hemoglobin: 15.7 g/dL (ref 13.0–18.0)
MCH: 28.6 pg (ref 26.0–34.0)
MCHC: 33.6 g/dL (ref 32.0–36.0)
MCV: 85.1 fL (ref 80.0–100.0)
PLATELETS: 165 10*3/uL (ref 150–440)
RBC: 5.5 MIL/uL (ref 4.40–5.90)
RDW: 13.1 % (ref 11.5–14.5)
WBC: 12.6 10*3/uL — ABNORMAL HIGH (ref 3.8–10.6)

## 2018-08-23 LAB — POCT ACTIVATED CLOTTING TIME: ACTIVATED CLOTTING TIME: 351 s

## 2018-08-23 LAB — MRSA PCR SCREENING: MRSA by PCR: NEGATIVE

## 2018-08-23 SURGERY — CORONARY/GRAFT ACUTE MI REVASCULARIZATION
Anesthesia: Moderate Sedation

## 2018-08-23 SURGERY — LEFT HEART CATH
Anesthesia: Moderate Sedation

## 2018-08-23 MED ORDER — SODIUM CHLORIDE 0.9% FLUSH: 3.0000 mL | INTRAVENOUS | Status: DC | PRN

## 2018-08-23 MED ORDER — LIDOCAINE HCL (PF) 1 % IJ SOLN
INTRAMUSCULAR | Status: AC
  Filled 2018-08-23: qty 30

## 2018-08-23 MED ORDER — DOCUSATE SODIUM 100 MG PO CAPS
100.0000 mg | ORAL_CAPSULE | Freq: Two times a day (BID) | ORAL | Status: DC
  Administered 2018-08-23 – 2018-08-25 (×5): 100 mg via ORAL
  Filled 2018-08-23 (×5): qty 1

## 2018-08-23 MED ORDER — TICAGRELOR 90 MG PO TABS
ORAL_TABLET | ORAL | Status: DC | PRN
  Administered 2018-08-23: 180 mg via ORAL

## 2018-08-23 MED ORDER — METOPROLOL TARTRATE 5 MG/5ML IV SOLN
INTRAVENOUS | Status: AC
  Filled 2018-08-23: qty 5

## 2018-08-23 MED ORDER — BIVALIRUDIN BOLUS VIA INFUSION - CUPID
INTRAVENOUS | Status: DC | PRN
  Administered 2018-08-23: 78.225 mg via INTRAVENOUS

## 2018-08-23 MED ORDER — MORPHINE SULFATE (PF) 2 MG/ML IV SOLN
2.0000 mg | INTRAVENOUS | Status: DC | PRN
  Administered 2018-08-23: 2 mg via INTRAVENOUS
  Administered 2018-08-23: 4 mg via INTRAVENOUS
  Administered 2018-08-23: 2 mg via INTRAVENOUS
  Administered 2018-08-23: 4 mg via INTRAVENOUS
  Administered 2018-08-23 – 2018-08-24 (×2): 2 mg via INTRAVENOUS
  Administered 2018-08-24: 4 mg via INTRAVENOUS
  Filled 2018-08-23: qty 2
  Filled 2018-08-23: qty 1
  Filled 2018-08-23: qty 2
  Filled 2018-08-23: qty 1
  Filled 2018-08-23: qty 2
  Filled 2018-08-23 (×2): qty 1

## 2018-08-23 MED ORDER — INSULIN GLARGINE 100 UNIT/ML ~~LOC~~ SOLN
24.0000 [IU] | Freq: Every day | SUBCUTANEOUS | Status: DC
  Administered 2018-08-23 – 2018-08-24 (×2): 24 [IU] via SUBCUTANEOUS
  Filled 2018-08-23 (×4): qty 0.24

## 2018-08-23 MED ORDER — ATENOLOL 25 MG PO TABS
50.0000 mg | ORAL_TABLET | Freq: Every day | ORAL | Status: DC
  Administered 2018-08-23: 50 mg via ORAL
  Filled 2018-08-23: qty 2

## 2018-08-23 MED ORDER — BISACODYL 5 MG PO TBEC: 5.0000 mg | DELAYED_RELEASE_TABLET | Freq: Every day | ORAL | Status: DC | PRN

## 2018-08-23 MED ORDER — HYDROCODONE-ACETAMINOPHEN 5-325 MG PO TABS
1.0000 | ORAL_TABLET | ORAL | Status: DC | PRN
  Administered 2018-08-25: 2 via ORAL
  Filled 2018-08-23: qty 2

## 2018-08-23 MED ORDER — ONDANSETRON HCL 4 MG/2ML IJ SOLN
4.0000 mg | Freq: Four times a day (QID) | INTRAMUSCULAR | Status: DC | PRN
  Administered 2018-08-23: 4 mg via INTRAVENOUS
  Filled 2018-08-23: qty 2

## 2018-08-23 MED ORDER — PREGABALIN 50 MG PO CAPS
50.0000 mg | ORAL_CAPSULE | Freq: Three times a day (TID) | ORAL | Status: DC
  Administered 2018-08-23 – 2018-08-25 (×7): 50 mg via ORAL
  Filled 2018-08-23 (×7): qty 1

## 2018-08-23 MED ORDER — ONDANSETRON HCL 4 MG/2ML IJ SOLN: 4.0000 mg | Freq: Four times a day (QID) | INTRAMUSCULAR | Status: DC | PRN

## 2018-08-23 MED ORDER — LORAZEPAM 2 MG/ML IJ SOLN
1.0000 mg | Freq: Once | INTRAMUSCULAR | Status: AC
  Administered 2018-08-23: 1 mg via INTRAVENOUS
  Filled 2018-08-23: qty 1

## 2018-08-23 MED ORDER — FENTANYL CITRATE (PF) 100 MCG/2ML IJ SOLN
INTRAMUSCULAR | Status: AC
  Filled 2018-08-23: qty 2

## 2018-08-23 MED ORDER — TIZANIDINE HCL 4 MG PO TABS
4.0000 mg | ORAL_TABLET | Freq: Three times a day (TID) | ORAL | Status: DC | PRN
  Administered 2018-08-24: 4 mg via ORAL
  Filled 2018-08-23 (×3): qty 1

## 2018-08-23 MED ORDER — ACETAMINOPHEN 650 MG RE SUPP: 650.0000 mg | Freq: Four times a day (QID) | RECTAL | Status: DC | PRN

## 2018-08-23 MED ORDER — LABETALOL HCL 5 MG/ML IV SOLN: 10.0000 mg | INTRAVENOUS | Status: AC | PRN

## 2018-08-23 MED ORDER — ALLOPURINOL 100 MG PO TABS
100.0000 mg | ORAL_TABLET | ORAL | Status: DC
  Administered 2018-08-23 – 2018-08-25 (×3): 100 mg via ORAL
  Filled 2018-08-23 (×3): qty 1

## 2018-08-23 MED ORDER — TICAGRELOR 90 MG PO TABS
90.0000 mg | ORAL_TABLET | Freq: Two times a day (BID) | ORAL | Status: DC
  Administered 2018-08-23 – 2018-08-25 (×5): 90 mg via ORAL
  Filled 2018-08-23 (×6): qty 1

## 2018-08-23 MED ORDER — TICAGRELOR 90 MG PO TABS
ORAL_TABLET | ORAL | Status: AC
  Filled 2018-08-23: qty 2

## 2018-08-23 MED ORDER — INSULIN ASPART 100 UNIT/ML ~~LOC~~ SOLN: 0.0000 [IU] | Freq: Every day | SUBCUTANEOUS | Status: DC

## 2018-08-23 MED ORDER — MIDAZOLAM HCL 2 MG/2ML IJ SOLN
INTRAMUSCULAR | Status: DC | PRN
  Administered 2018-08-23: 1 mg via INTRAVENOUS

## 2018-08-23 MED ORDER — ONDANSETRON HCL 4 MG PO TABS: 4.0000 mg | ORAL_TABLET | Freq: Four times a day (QID) | ORAL | Status: DC | PRN

## 2018-08-23 MED ORDER — ACETAMINOPHEN 325 MG PO TABS: 650.0000 mg | ORAL_TABLET | ORAL | Status: DC | PRN

## 2018-08-23 MED ORDER — METOPROLOL TARTRATE 5 MG/5ML IV SOLN
2.5000 mg | Freq: Once | INTRAVENOUS | Status: AC
  Administered 2018-08-23: 2.5 mg via INTRAVENOUS
  Filled 2018-08-23: qty 5

## 2018-08-23 MED ORDER — MIDAZOLAM HCL 2 MG/2ML IJ SOLN
INTRAMUSCULAR | Status: AC
  Filled 2018-08-23: qty 2

## 2018-08-23 MED ORDER — ASPIRIN EC 81 MG PO TBEC
81.0000 mg | DELAYED_RELEASE_TABLET | Freq: Every day | ORAL | Status: DC
  Administered 2018-08-23 – 2018-08-24 (×2): 81 mg via ORAL
  Filled 2018-08-23 (×2): qty 1

## 2018-08-23 MED ORDER — SODIUM CHLORIDE 0.9 % IV SOLN
0.2500 mg/kg/h | INTRAVENOUS | Status: AC
  Administered 2018-08-23: 0.25 mg/kg/h via INTRAVENOUS
  Filled 2018-08-23 (×2): qty 250

## 2018-08-23 MED ORDER — BIVALIRUDIN TRIFLUOROACETATE 250 MG IV SOLR
INTRAVENOUS | Status: AC
  Filled 2018-08-23: qty 250

## 2018-08-23 MED ORDER — ASPIRIN 81 MG PO CHEW
81.0000 mg | CHEWABLE_TABLET | Freq: Every day | ORAL | Status: DC
  Administered 2018-08-25: 81 mg via ORAL
  Filled 2018-08-23: qty 1

## 2018-08-23 MED ORDER — IOPAMIDOL (ISOVUE-300) INJECTION 61%
INTRAVENOUS | Status: DC | PRN
  Administered 2018-08-23: 280 mL

## 2018-08-23 MED ORDER — SODIUM CHLORIDE 0.9 % IV SOLN: 250.0000 mL | INTRAVENOUS | Status: DC | PRN

## 2018-08-23 MED ORDER — LIDOCAINE HCL (PF) 1 % IJ SOLN
INTRAMUSCULAR | Status: DC | PRN
  Administered 2018-08-23: 20 mL via SUBCUTANEOUS

## 2018-08-23 MED ORDER — HEPARIN (PORCINE) IN NACL 1000-0.9 UT/500ML-% IV SOLN
INTRAVENOUS | Status: AC
  Filled 2018-08-23: qty 1000

## 2018-08-23 MED ORDER — LIDOCAINE 5 % EX PTCH
1.0000 | MEDICATED_PATCH | CUTANEOUS | Status: DC
  Administered 2018-08-23 – 2018-08-24 (×2): 1 via TRANSDERMAL
  Filled 2018-08-23 (×3): qty 1

## 2018-08-23 MED ORDER — ASPIRIN 81 MG PO CHEW: 81.0000 mg | CHEWABLE_TABLET | ORAL | Status: AC

## 2018-08-23 MED ORDER — ACETAMINOPHEN 325 MG PO TABS
650.0000 mg | ORAL_TABLET | Freq: Four times a day (QID) | ORAL | Status: DC | PRN
  Administered 2018-08-24: 650 mg via ORAL
  Filled 2018-08-23: qty 2

## 2018-08-23 MED ORDER — LORATADINE 10 MG PO TABS
10.0000 mg | ORAL_TABLET | Freq: Every day | ORAL | Status: DC
  Administered 2018-08-23 – 2018-08-25 (×3): 10 mg via ORAL
  Filled 2018-08-23 (×3): qty 1

## 2018-08-23 MED ORDER — SODIUM CHLORIDE 0.9 % WEIGHT BASED INFUSION
1.0000 mL/kg/h | INTRAVENOUS | Status: AC
  Administered 2018-08-23 (×2): 1 mL/kg/h via INTRAVENOUS

## 2018-08-23 MED ORDER — SODIUM CHLORIDE 0.9 % WEIGHT BASED INFUSION: 1.0000 mL/kg/h | INTRAVENOUS | Status: DC

## 2018-08-23 MED ORDER — METOPROLOL TARTRATE 50 MG PO TABS
50.0000 mg | ORAL_TABLET | Freq: Three times a day (TID) | ORAL | Status: DC
  Administered 2018-08-23 – 2018-08-25 (×5): 50 mg via ORAL
  Filled 2018-08-23 (×5): qty 1

## 2018-08-23 MED ORDER — SODIUM CHLORIDE 0.9 % IV SOLN
INTRAVENOUS | Status: AC | PRN
  Administered 2018-08-23: 1.75 mg/kg/h via INTRAVENOUS

## 2018-08-23 MED ORDER — DIPHENHYDRAMINE HCL 25 MG PO CAPS
50.0000 mg | ORAL_CAPSULE | Freq: Every day | ORAL | Status: DC
  Administered 2018-08-23 – 2018-08-24 (×2): 50 mg via ORAL
  Filled 2018-08-23 (×2): qty 1
  Filled 2018-08-23: qty 2

## 2018-08-23 MED ORDER — METOPROLOL TARTRATE 5 MG/5ML IV SOLN
2.5000 mg | Freq: Once | INTRAVENOUS | Status: AC
  Administered 2018-08-23: 2.5 mg via INTRAVENOUS

## 2018-08-23 MED ORDER — HEPARIN (PORCINE) IN NACL 1000-0.9 UT/500ML-% IV SOLN
INTRAVENOUS | Status: DC | PRN
  Administered 2018-08-23: 1000 mL

## 2018-08-23 MED ORDER — INSULIN ASPART 100 UNIT/ML ~~LOC~~ SOLN
0.0000 [IU] | Freq: Three times a day (TID) | SUBCUTANEOUS | Status: DC
  Administered 2018-08-23 (×2): 1 [IU] via SUBCUTANEOUS
  Administered 2018-08-24: 2 [IU] via SUBCUTANEOUS
  Filled 2018-08-23: qty 1

## 2018-08-23 MED ORDER — METOPROLOL TARTRATE 50 MG PO TABS
50.0000 mg | ORAL_TABLET | Freq: Two times a day (BID) | ORAL | Status: DC
  Administered 2018-08-23: 50 mg via ORAL
  Filled 2018-08-23: qty 1

## 2018-08-23 MED ORDER — SODIUM CHLORIDE 0.9% FLUSH
3.0000 mL | INTRAVENOUS | Status: DC | PRN
  Administered 2018-08-24: 3 mL via INTRAVENOUS
  Filled 2018-08-23: qty 3

## 2018-08-23 MED ORDER — SODIUM CHLORIDE 0.9% FLUSH
3.0000 mL | Freq: Two times a day (BID) | INTRAVENOUS | Status: DC
  Administered 2018-08-23 – 2018-08-25 (×5): 3 mL via INTRAVENOUS

## 2018-08-23 MED ORDER — NITROGLYCERIN 5 MG/ML IV SOLN
INTRAVENOUS | Status: AC
  Filled 2018-08-23: qty 10

## 2018-08-23 MED ORDER — HYDRALAZINE HCL 20 MG/ML IJ SOLN: 5.0000 mg | INTRAMUSCULAR | Status: AC | PRN

## 2018-08-23 MED ORDER — MELATONIN 5 MG PO TABS
5.0000 mg | ORAL_TABLET | Freq: Every day | ORAL | Status: DC
  Administered 2018-08-23 – 2018-08-24 (×2): 5 mg via ORAL
  Filled 2018-08-23 (×3): qty 1

## 2018-08-23 MED ORDER — OXYCODONE-ACETAMINOPHEN 7.5-325 MG PO TABS
1.0000 | ORAL_TABLET | Freq: Three times a day (TID) | ORAL | Status: DC | PRN
  Administered 2018-08-24: 1 via ORAL
  Filled 2018-08-23: qty 1

## 2018-08-23 MED ORDER — FAMOTIDINE 20 MG PO TABS
10.0000 mg | ORAL_TABLET | Freq: Every day | ORAL | Status: DC
  Administered 2018-08-23 – 2018-08-25 (×3): 10 mg via ORAL
  Filled 2018-08-23 (×3): qty 1

## 2018-08-23 MED ORDER — SODIUM CHLORIDE 0.9 % IV SOLN
Freq: Once | INTRAVENOUS | Status: AC
  Administered 2018-08-23: 75 mL/h via INTRAVENOUS

## 2018-08-23 MED ORDER — SODIUM CHLORIDE 0.9 % WEIGHT BASED INFUSION: 3.0000 mL/kg/h | INTRAVENOUS | Status: AC

## 2018-08-23 MED ORDER — SODIUM CHLORIDE 0.9% FLUSH
3.0000 mL | Freq: Two times a day (BID) | INTRAVENOUS | Status: DC
  Administered 2018-08-23 – 2018-08-25 (×4): 3 mL via INTRAVENOUS

## 2018-08-23 SURGICAL SUPPLY — 18 items
BALLN TREK RX 2.25X15 (BALLOONS) ×2
BALLN ~~LOC~~ EUPHORA RX 2.75X20 (BALLOONS) ×2
BALLOON TREK RX 2.25X15 (BALLOONS) ×1 IMPLANT
BALLOON ~~LOC~~ EUPHORA RX 2.75X20 (BALLOONS) ×1 IMPLANT
CATH INFINITI 5FR ANG PIGTAIL (CATHETERS) ×2 IMPLANT
CATH INFINITI 5FR JL4 (CATHETERS) ×2 IMPLANT
CATH INFINITI JR4 5F (CATHETERS) ×2 IMPLANT
CATH VISTA GUIDE 6FR XB3.5 (CATHETERS) ×2 IMPLANT
DEVICE CLOSURE MYNXGRIP 6/7F (Vascular Products) ×2 IMPLANT
DEVICE INFLAT 30 PLUS (MISCELLANEOUS) ×2 IMPLANT
KIT MANI 3VAL PERCEP (MISCELLANEOUS) ×2 IMPLANT
NEEDLE PERC 18GX7CM (NEEDLE) ×2 IMPLANT
PACK CARDIAC CATH (CUSTOM PROCEDURE TRAY) ×2 IMPLANT
SHEATH AVANTI 6FR X 11CM (SHEATH) ×2 IMPLANT
STENT RESOLUTE ONYX 2.25X34 (Permanent Stent) ×2 IMPLANT
SYR MEDRAD MARK V 150ML (SYRINGE) ×2 IMPLANT
WIRE ASAHI PROWATER 180CM (WIRE) ×2 IMPLANT
WIRE GUIDERIGHT .035X150 (WIRE) ×2 IMPLANT

## 2018-08-23 NOTE — ED Notes (Signed)
John Hartman arrived to ED room 5.

## 2018-08-23 NOTE — Consult Note (Signed)
Name: John Hartman MRN: [id number] DOB: [birth year]    ADMISSION DATE:  08/22/2018 CONSULTATION DATE: 08/23/2018  REFERRING MD : Dr. Saralyn Pilar   CHIEF COMPLAINT: Chest Pain   BRIEF PATIENT DESCRIPTION:  47 yo male admitted with unstable angina secondary to STEMI underwent cardiac catheterization requiring drug eluting stent due to 100% stenosis of distal left circumflex   SIGNIFICANT EVENTS/STUDIES:  08/29 Pt admitted to ICU s/p cardiac catheterization  08/29 Cardiac catheterization results revealed One-vessel coronary artery disease with occluded distal dominant left circumflex. Normal left ventricular function Successful primary PCI with DES distal left circumflex  HISTORY OF PRESENT ILLNESS:   This is a 47 yo male with a PMH as listed below who presented to [facility] ER on 08/28 with 10/10 central chest pain with radiation to the bilateral arms onset of symptoms 08/27.  Per ER notes he had a steroid injection between L4 and L5 for significant back pain on 08/20/18 performed by Dr. Holley Raring at the pain clinic.  Therefore, he notified Dr. Holley Raring regarding his chest pain who recommended he take his pain medication to treat possible back spasms. Due to worsening chest pain he proceeded to the ER.  Upon arrival to the ER he was noted to be hypertensive bp 160's/110's.  Initial EKG revealed ST depression and troponin 12.46, he received sublingual nitroglycerin, iv heparin bolus, and heparin gtt started.  ER physician notified on call cardiologist Dr. Saralyn Pilar who evaluated the pt, per Cardiology at that time pt did not meet STEMI criteria recommended to continue to treat medically overnight with plans for cardiac catheterization on 08/29. The pt continued to c/o chest pain despite 3 sublingual nitroglycerin, therefore nitroglycerin gtt initiated with slight improvement.  However, he developed 10/10 chest pain again with diaphoresis despite nitroglycerin gtt, EKG revealed ST segment elevation in leads III and  aVF and ST depression in lead V2, I, and aVL.  ER physician notified on call cardiology and pt transported to cardiac cath lab for cardiac catheterization.  He required drug eluting stent due to 100% stenosis of distal left circumflex.  He was subsequently admitted to ICU post cath for further workup and treatment.   PAST MEDICAL HISTORY :   has a past medical history of Angioedema, Bronchitis, DDD (degenerative disc disease), cervical, Diabetes mellitus ([place]), Fatty liver (03/31/2017), GERD (gastroesophageal reflux disease), Gout, HTN (hypertension), Hypertension, IBS (irritable bowel syndrome), Seasonal allergies, and Spinal stenosis.  has a past surgical history that includes Shoulder surgery (Left, 2007); Nasal septum surgery (2004); ACDF with fusion (2007); Testicular torsion (1980s); Vasectomy; Colonoscopy with propofol (N/A, 06/26/2015); and Cardiac catheterization (Left, 08/15/2016). Prior to Admission medications   Medication Sig Start Date End Date Taking? Authorizing Provider  allopurinol (ZYLOPRIM) 100 MG tablet Take 100 mg by mouth every morning.    Yes [provider]  aspirin EC 81 MG tablet Take 81 mg by mouth daily.   Yes [provider]  atenolol (TENORMIN) 50 MG tablet Take 1 tablet (50 mg total) by mouth daily. 09/22/17  Yes Lada, Satira Anis, MD  [place] alk-PHENObarbital (DONNATAL) 16.2 MG tablet Take 1 tablet by mouth as needed (pain).   Yes [provider]  cetirizine (ZYRTEC) 10 MG tablet Take 10 mg by mouth at bedtime.    Yes [provider]  colchicine 0.6 MG tablet Take 0.6 mg by mouth daily as needed (gout).    Yes [provider]  Dapagliflozin-metFORMIN HCl ER (XIGDUO XR) 04-999 MG TB24 Take 2 tablets by mouth daily.  Yes [provider]  diphenhydrAMINE (BENADRYL) 25 mg capsule Take 50 mg by mouth at bedtime.    Yes [provider]  hydrochlorothiazide (HYDRODIURIL) 12.5 MG tablet Take 12.5 mg by mouth daily.    Yes [provider]  indomethacin (INDOCIN) 25 MG capsule Take 25 mg by mouth 2 (two) times daily as needed for mild pain.   Yes [provider]  Insulin Degludec (TRESIBA FLEXTOUCH) 100 UNIT/ML SOPN Inject 34 Units into the skin at bedtime.  07/12/16  Yes Lada, Satira Anis, MD  Melatonin 5 MG TABS Take 5 mg by mouth at bedtime.    Yes [provider]  oxyCODONE-acetaminophen (PERCOCET) 7.5-325 MG tablet Take 1 tablet by mouth 3 (three) times daily as needed for severe pain. 08/09/18  Yes Gillis Santa, MD  pregabalin (LYRICA) 50 MG capsule Take 1 capsule (50 mg total) by mouth 3 (three) times daily. 08/09/18  Yes Gillis Santa, MD  ranitidine (ZANTAC) 150 MG capsule Take 150 mg by mouth at bedtime.    Yes [provider]  tiZANidine (ZANAFLEX) 4 MG tablet Take 1 tablet (4 mg total) by mouth every 8 (eight) hours as needed for muscle spasms. 03/12/18  Yes Gillis Santa, MD  ACCU-CHEK SOFTCLIX LANCETS lancets USE AS DIRECTED 07/23/16   Lada, Satira Anis, MD  EPINEPHrine (EPIPEN 2-PAK) 0.3 mg/0.3 mL IJ SOAJ injection Inject 0.3 mg into the muscle once.    [provider]  glucose blood (ACCU-CHEK ACTIVE STRIPS) test strip Use as instructed 07/09/16   Arnetha Courser, MD  glucose blood (ACCU-CHEK AVIVA PLUS) test strip Check FSBS three to four times per day 07/23/16   Arnetha Courser, MD  Insulin Pen Needle (B-D UF III MINI PEN NEEDLES) 31G X 5 MM MISC Once a day with insulin 07/09/16   Lada, Satira Anis, MD  lidocaine (LIDODERM) 5 % Place 1 patch onto the skin every 12 (twelve) hours. Remove & Discard patch within 12 hours or as directed by MD Patient not taking: Reported on 08/22/2018 06/25/18 06/25/19  Laban Emperor, PA-C   Allergies  Allergen Reactions  . Lisinopril Swelling  . Ace Inhibitors Swelling    FAMILY HISTORY:  family history includes Allergies in his son; Alzheimer's disease in his maternal grandmother; Arrhythmia in his mother; Breast cancer in his  mother; Cancer in his maternal grandfather and mother; Diabetes in his father; Heart disease in his brother; Hypertension in his father; Skin cancer in his mother. SOCIAL HISTORY:  reports that he quit smoking about 3 years ago. His smoking use included cigarettes. He has a 10.00 pack-year smoking history. He has never used smokeless tobacco. He reports that he does not drink alcohol or use drugs.  REVIEW OF SYSTEMS: Positives in BOLD  Constitutional: fever, chills, weight loss, malaise/fatigue and diaphoresis.  HENT: Negative for hearing loss, ear pain, nosebleeds, congestion, sore throat, neck pain, tinnitus and ear discharge.   Eyes: Negative for blurred vision, double vision, photophobia, pain, discharge and redness.  Respiratory: Negative for cough, hemoptysis, sputum production, shortness of breath, wheezing and stridor.  Cardiovascular: chest pain, palpitations, orthopnea, claudication, leg swelling and PND.  Gastrointestinal: heartburn, nausea, vomiting, abdominal pain, diarrhea, constipation, blood in stool and melena.  Genitourinary: Negative for dysuria, urgency, frequency, hematuria and flank pain.  Musculoskeletal: Negative for myalgias, back pain, joint pain and falls.  Skin: Negative for itching and rash.  Neurological: Negative for dizziness, tingling, tremors, sensory change, speech change, focal weakness, seizures, loss of consciousness, weakness  and headaches.  Endo/Heme/Allergies: Negative for environmental allergies and polydipsia. Does not bruise/bleed easily.  SUBJECTIVE:  Pt states chest pain has resolved   VITAL SIGNS: Temp:  [98.9 F (37.2 C)] 98.9 F (37.2 C) (08/28 2046) Pulse Rate:  [81-124] 121 (08/29 0400) Resp:  [0-32] 17 (08/29 0400) BP: (116-184)/(78-151) 132/92 (08/29 0400) SpO2:  [93 %-100 %] 94 % (08/29 0400) Weight:  [104.3 kg] 104.3 kg (08/28 2047)  PHYSICAL EXAMINATION: General: well developed, well nourished male, NAD  Neuro: alert and oriented,  follows commands  HEENT: supple, no JVD  Cardiovascular: nsr, rrr, no R/G, 2+ bilateral distal pulses  Lungs: clear throughout, even, non labored  Abdomen: +BS x4, soft, non tender, non distended  Musculoskeletal: normal bulk and tone, no edema  Skin: right groin cardiac cath site no bleeding or hematoma present, dressing dry and intact  Recent Labs  Lab 08/22/18 2052  NA 140  K 3.6  CL 100  CO2 27  BUN 19  CREATININE 1.16  GLUCOSE 119*   Recent Labs  Lab 08/22/18 2052  HGB 17.6  HCT 51.6  WBC 12.8*  PLT 172   Dg Chest 2 View  Result Date: 08/22/2018 CLINICAL DATA:  Generalized chest pain beginning yesterday. EXAM: CHEST - 2 VIEW COMPARISON:  Chest radiograph-07/17/2008; chest CT-07/18/2008; lumbar spine radiographs-01/02/2018 FINDINGS: Grossly unchanged cardiac silhouette and mediastinal contours. Bilateral infrahilar heterogeneous opacities are unchanged and favored to represent atelectasis. No new focal airspace opacities. No pleural effusion or pneumothorax. No evidence of edema. No acute osseus abnormalities. Post lower cervical ACDF, incompletely evaluated. Degenerative change of the superior aspect of the lumbar spine, incompletely evaluated though grossly similar to lumbar spine radiographs performed 12/2017. IMPRESSION: No acute cardiopulmonary disease. Electronically Signed   By: Sandi Mariscal M.D.   On: 08/22/2018 21:02   Ct Chest W Contrast  Result Date: 08/22/2018 CLINICAL DATA:  Generalized chest pain EXAM: CT CHEST WITH CONTRAST TECHNIQUE: Multidetector CT imaging of the chest was performed during intravenous contrast administration. CONTRAST:  76m OMNIPAQUE IOHEXOL 300 MG/ML  SOLN COMPARISON:  Chest x-ray today.  Chest CT 07/18/2008 FINDINGS: Cardiovascular: Scattered aortic calcifications and coronary artery calcifications. Heart is normal size. Aorta is normal caliber. Mediastinum/Nodes: No mediastinal, hilar, or axillary adenopathy. Lungs/Pleura: Lungs are clear. No  focal airspace opacities or suspicious nodules. No effusions. Upper Abdomen: Diffuse fatty infiltration of the liver. No acute findings Musculoskeletal: Chest wall soft tissues are unremarkable. No acute bony abnormality. IMPRESSION: No acute cardiopulmonary disease. Coronary artery disease. Fatty infiltration of the liver. Aortic Atherosclerosis (ICD10-I70.0). Electronically Signed   By: KRolm BaptiseM.D.   On: 08/22/2018 23:38    ASSESSMENT / PLAN: Unstable angina secondary to STEMI s/p cardiac catheterization with DES to left circumflex 08/23/18 Hypertension  Hx: Fatty Liver Disease, GERD, and Type II Diabetes Mellitus P: Supplemental O2 for dyspnea and/or hypoxia  Continuous telemetry monitoring  Trend troponin's Continue cardiac medications per Cardiology recommendations  Continue Angiomax per cardiology recommendations Trend CBC  Monitor for s/sx of bleeding and transfuse for hgb <8  Prn percocet for pain management CBG's ac/hs  SSI  Urine drug screen pending   DMarda Stalker AWoodlawnPager [phone](please enter 7 digits) PCCM Consult Pager [phone](please enter 7 digits)

## 2018-08-23 NOTE — Progress Notes (Signed)
Dr. Saralyn Pilar notified of troponin of 21.6, CP 6/10, intermittent stabbing pain upper left chest, BP 158/114, HR now 82. Advised him that I had given 4 mg of morphine. MD ordered repeat ECG and 5 mg metoprolol in 2.5 mg doses; he will come re-evaluate patient. Orders carried out.

## 2018-08-23 NOTE — ED Notes (Signed)
Pt with sudden onset of N/V, diaphoresis, pale and increase of chest pain. MD in ED, Owens Shark and R[place]bark assisted in medical interventions due to onset of symptoms. See MAR for medication. Posterior EKG as well as basic 12 lead EKG performed due to changes in pt. Cardiologist re contacted due to changes. STEMI team activated per [place].

## 2018-08-23 NOTE — ED Provider Notes (Signed)
I was notified by the nursing staff at 3:15 AM that the patient had an episode of 10 out of 10 chest pain became acutely diaphoretic on my arrival to the room patient in obvious distress.  Repeat EKG was performed which revealed ST segment elevation  2 mm in leads 3 and aVF.  In addition patient has ST segment depression 3 mm in lead V2, I and aVL.  Patient discussed with Dr. Saralyn Pilar cardiologist on-call with plan to take the patient to the Cath Lab at this time.  ED ECG REPORT I, Harris N Vanessia Bokhari, the attending physician, personally viewed and interpreted this ECG.   Date: 08/23/2018  EKG Time: 3:27 AM  Rate: 127  Rhythm: Sinus tachycardia  Axis: Normal  Intervals: Normal  ST&T Change: 2 mm ST segment elevation in leads III and aVF.  ST segment depression 3 mm V2, 1 and aVL  CRITICAL CARE Performed by: Gregor Hams   Total critical care time: 30 minutes  Critical care time was exclusive of separately billable procedures and treating other patients.  Critical care was necessary to treat or prevent imminent or life-threatening deterioration.  Critical care was time spent personally by me on the following activities: development of treatment plan with patient and/or surrogate as well as nursing, discussions with consultants, evaluation of patient's response to treatment, examination of patient, obtaining history from patient or surrogate, ordering and performing treatments and interventions, ordering and review of laboratory studies, ordering and review of radiographic studies, pulse oximetry and re-evaluation of patient's condition.    Gregor Hams, MD 08/23/18 [phone]

## 2018-08-23 NOTE — Progress Notes (Signed)
Dr. Saralyn Pilar in to evaluate patient. Tachycardia is noted w/ some ST elevation. 12-lead EKG done, per MD order. Metoprolol 50% Q8 and Troponin now then 4 hours later ordered. EKG done and shown to MD. Will continue to monitor patient and keep MD updated of condition.

## 2018-08-23 NOTE — Progress Notes (Signed)
[facility] [place] NAME: Sherlock Nancarrow    MR#:  [id number]  DATE OF BIRTH:  [birth year]  SUBJECTIVE:  CHIEF COMPLAINT:   Chief Complaint  Patient presents with  . Chest Pain  Patient seen and evaluated today Has on and off chest pain No shortness of breath Chronic back pain  REVIEW OF SYSTEMS:    ROS  CONSTITUTIONAL: No documented fever. No fatigue, weakness. No weight gain, no weight loss.  EYES: No blurry or double vision.  ENT: No tinnitus. No postnasal drip. No redness of the oropharynx.  RESPIRATORY: No cough, no wheeze, no hemoptysis. No dyspnea.  CARDIOVASCULAR: Minimal chest pain. No orthopnea. No palpitations. No syncope.  GASTROINTESTINAL: No nausea, no vomiting or diarrhea. No abdominal pain. No melena or hematochezia.  GENITOURINARY: No dysuria or hematuria.  ENDOCRINE: No polyuria or nocturia. No heat or cold intolerance.  HEMATOLOGY: No anemia. No bruising. No bleeding.  INTEGUMENTARY: No rashes. No lesions.  MUSCULOSKELETAL: No arthritis. No swelling. No gout.  NEUROLOGIC: No numbness, tingling, or ataxia. No seizure-type activity.  PSYCHIATRIC: No anxiety. No insomnia. No ADD.   DRUG ALLERGIES:   Allergies  Allergen Reactions  . Lisinopril Swelling  . Ace Inhibitors Swelling    VITALS:  Blood pressure (!) 148/108, pulse 93, temperature 99.3 F (37.4 C), temperature source Oral, resp. rate (!) 21, height 5\' 11"  (1.803 m), weight 104.3 kg, SpO2 95 %.  PHYSICAL EXAMINATION:   Physical Exam  GENERAL:  47 y.o.-year-old patient lying in the bed with no acute distress.  EYES: Pupils equal, round, reactive to light and accommodation. No scleral icterus. Extraocular muscles intact.  HEENT: Head atraumatic, normocephalic. Oropharynx and nasopharynx clear.  NECK:  Supple, no jugular venous distention. No thyroid enlargement, no tenderness.  LUNGS: Normal breath sounds bilaterally, no wheezing, rales, rhonchi. No use of  accessory muscles of respiration.  CARDIOVASCULAR: S1, S2 normal. No murmurs, rubs, or gallops.  ABDOMEN: Soft, nontender, nondistended. Bowel sounds present. No organomegaly or mass.  EXTREMITIES: No cyanosis, clubbing or edema b/l.    NEUROLOGIC: Cranial nerves II through XII are intact. No focal Motor or sensory deficits b/l.   PSYCHIATRIC: The patient is alert and oriented x 3.  SKIN: No obvious rash, lesion, or ulcer.   LABORATORY PANEL:   CBC Recent Labs  Lab 08/23/18 0606  WBC 12.6*  HGB 15.7  HCT 46.8  PLT 165   ------------------------------------------------------------------------------------------------------------------ Chemistries  Recent Labs  Lab 08/22/18 2052 08/23/18 0606  NA 140 137  K 3.6 3.6  CL 100 100  CO2 27 25  GLUCOSE 119* 138*  BUN 19 16  CREATININE 1.16 0.88  CALCIUM 9.9 8.7*  AST 211*  --   ALT 121*  --   ALKPHOS 66  --   BILITOT 1.1  --    ------------------------------------------------------------------------------------------------------------------  Cardiac Enzymes Recent Labs  Lab 08/23/18 1227  TROPONINI 21.60*   ------------------------------------------------------------------------------------------------------------------  RADIOLOGY:  Dg Chest 2 View  Result Date: 08/22/2018 CLINICAL DATA:  Generalized chest pain beginning yesterday. EXAM: CHEST - 2 VIEW COMPARISON:  Chest radiograph-07/17/2008; chest CT-07/18/2008; lumbar spine radiographs-01/02/2018 FINDINGS: Grossly unchanged cardiac silhouette and mediastinal contours. Bilateral infrahilar heterogeneous opacities are unchanged and favored to represent atelectasis. No new focal airspace opacities. No pleural effusion or pneumothorax. No evidence of edema. No acute osseus abnormalities. Post lower cervical ACDF, incompletely evaluated. Degenerative change of the superior aspect of the lumbar spine, incompletely evaluated though grossly similar to lumbar spine radiographs  performed 12/2017. IMPRESSION: No acute cardiopulmonary disease. Electronically Signed   By: Sandi Mariscal M.D.   On: 08/22/2018 21:02   Ct Chest W Contrast  Result Date: 08/22/2018 CLINICAL DATA:  Generalized chest pain EXAM: CT CHEST WITH CONTRAST TECHNIQUE: Multidetector CT imaging of the chest was performed during intravenous contrast administration. CONTRAST:  72mL OMNIPAQUE IOHEXOL 300 MG/ML  SOLN COMPARISON:  Chest x-ray today.  Chest CT 07/18/2008 FINDINGS: Cardiovascular: Scattered aortic calcifications and coronary artery calcifications. Heart is normal size. Aorta is normal caliber. Mediastinum/Nodes: No mediastinal, hilar, or axillary adenopathy. Lungs/Pleura: Lungs are clear. No focal airspace opacities or suspicious nodules. No effusions. Upper Abdomen: Diffuse fatty infiltration of the liver. No acute findings Musculoskeletal: Chest wall soft tissues are unremarkable. No acute bony abnormality. IMPRESSION: No acute cardiopulmonary disease. Coronary artery disease. Fatty infiltration of the liver. Aortic Atherosclerosis (ICD10-I70.0). Electronically Signed   By: Rolm Baptise M.D.   On: 08/22/2018 23:38     ASSESSMENT AND PLAN:  47 year old male patient with history of type 2 diabetes mellitus, hypertension, hyperlipidemia, spinal stenosis, chronic back pain admitted for non-STEMI.  Patient had cardiac catheterization.  -Non-ST elevation MI Status post PCI Status post stent placement 100% occlusion in the mid circumflex Continue aspirin, statin, Brilinta, beta-blocker  -Coronary artery disease Continue aspirin, statin, Brilinta, beta-blocker  -Hyperlipidemia Continue statin medication high intensity  -DVT prophylaxis With subcu Lovenox  All the records are reviewed and case discussed with Care Management/Social Worker. Management plans discussed with the patient, family and they are in agreement.  CODE STATUS: Full code  DVT Prophylaxis: SCDs  TOTAL TIME TAKING CARE OF THIS  PATIENT: 35 minutes.   POSSIBLE D/C IN 1 to 2 DAYS, DEPENDING ON CLINICAL CONDITION.  Saundra Shelling M.D on 08/23/2018 at 1:32 PM  Between 7am to 6pm - Pager - [phone]  After 6pm go to [email or web address] - password [email or web address] [facility]  Office  [phone]  CC: Primary care physician; Arnetha Courser, MD  Note: This dictation was prepared with Dragon dictation along with smaller phrase technology. Any transcriptional errors that result from this process are unintentional.

## 2018-08-23 NOTE — Progress Notes (Signed)
   08/23/18 1105  Clinical Encounter Type  Visited With Patient and family together  Visit Type Initial;Spiritual support  Referral From Nurse  Consult/Referral To Chaplain  Spiritual Encounters  Spiritual Needs Prayer;Emotional   Author accompanied [facility] Mullins to visit patient as an initial visit for [place]. Author shadowed [place] during visit. Patient was awake, alert and talkative during visit. Spouse was present. She remained seated throughout most of visit but was engaged in conversation. Spouse is a source of support for patient. Patient and spouse have a local church family and faith is important to them. Patient was descriptive of events which have taken place over last 48 hours. Patient seemed reflective and expressed emotions freely. Became emotive several times throughout visit and was seen wiping tears from eyes repeatedly. Is grieving loss of health which began 2 years ago with back problems. Also anxious for 31 yo son who is not coping well with pt's loss of health. Family tensions exist with in-laws. Chief Strategy Officer provided pastoral presence. Patient/spouse welcomed prayer. Held hands and prayed for comfort and hope. Humor was shared in visit. Follow up is needed.

## 2018-08-23 NOTE — Progress Notes (Signed)
   08/23/18 0339  Clinical Encounter Type  Visited With Patient and family together;Health care provider  Visit Type Code (code STEMI)  Spiritual Encounters  Spiritual Needs Emotional;Prayer  Stress Factors  Patient Stress Factors Health changes   Chaplain responded to code STEMI in ED.  Chaplain met with patient and spouse, utilized active and reflective listening as they reviewed the events of the last few days which led them to the hospital now.  Both reported that they have been married 19 years and are 'crazy' about one another.  Chaplain prayed with patient and spouse, then continued active listening and empathic reflection as spouse relayed concerns for younger son and informing him of changes in patient.  After patient went to the cath lab, chaplain escorted spouse to waiting room and continued offering emotional support and active and reflective listening as she spoke of family dynamics and focus on caring for spouse.  Conversation regarding self-care (emotional, physical, and physical) and moving forward in the days ahead.  Chaplain then escorted patient's spouse to the ICU waiting room and waited with her until spouse's personal care was complete.  Family appreciative of and open to ongoing chaplain follow up.

## 2018-08-23 NOTE — ED Notes (Signed)
Pts clothing(1 short, 1 gray underwear), gray ring, black bractlet removed and placed into personal belongings bag and given to wife at bedside. Pads placed onto pt.

## 2018-08-23 NOTE — ED Notes (Addendum)
Spoke with Dr. Saralyn Pilar due to pts return on chest pain. Per [facility], continued nitro titration advised. Pt 5/10 chest pain.

## 2018-08-24 ENCOUNTER — Other Ambulatory Visit: Payer: Self-pay

## 2018-08-24 ENCOUNTER — Inpatient Hospital Stay
Admit: 2018-08-24 | Discharge: 2018-08-24 | Disposition: A | Discharge status: Home / Self Care | Payer: No Typology Code available for payment source | Attending: Rehabilitative and Restorative Service Providers" | Admitting: Rehabilitative and Restorative Service Providers"

## 2018-08-24 DIAGNOSIS — Z955 Presence of coronary angioplasty implant and graft: Secondary | ICD-10-CM

## 2018-08-24 LAB — CBC
HCT: 48.5 % (ref 40.0–52.0)
Hemoglobin: 16.5 g/dL (ref 13.0–18.0)
MCH: 29.2 pg (ref 26.0–34.0)
MCHC: 34 g/dL (ref 32.0–36.0)
MCV: 85.7 fL (ref 80.0–100.0)
Platelets: 151 K/uL (ref 150–440)
RBC: 5.67 MIL/uL (ref 4.40–5.90)
RDW: 13.2 % (ref 11.5–14.5)
WBC: 10.4 K/uL (ref 3.8–10.6)

## 2018-08-24 LAB — BASIC METABOLIC PANEL WITH GFR
Anion gap: 7 (ref 5–15)
BUN: 16 mg/dL (ref 6–20)
CO2: 22 mmol/L (ref 22–32)
Calcium: 8.6 mg/dL — ABNORMAL LOW (ref 8.9–10.3)
Chloride: 106 mmol/L (ref 98–111)
Creatinine, Ser: 0.81 mg/dL (ref 0.61–1.24)
GFR calc Af Amer: >60 mL/min
GFR calc non Af Amer: >60 mL/min
Glucose, Bld: 107 mg/dL — ABNORMAL HIGH (ref 70–99)
Potassium: 3.7 mmol/L (ref 3.5–5.1)
Sodium: 135 mmol/L (ref 135–145)

## 2018-08-24 LAB — HIV ANTIBODY (ROUTINE TESTING W REFLEX): HIV SCREEN 4TH GENERATION: NONREACTIVE

## 2018-08-24 LAB — ECHOCARDIOGRAM COMPLETE
Height: 71 in
Weight: 3680 oz

## 2018-08-24 LAB — GLUCOSE, CAPILLARY
GLUCOSE-CAPILLARY: 172 mg/dL — AB (ref 70–99)
GLUCOSE-CAPILLARY: 123 mg/dL — AB (ref 70–99)
Glucose-Capillary: 107 mg/dL — ABNORMAL HIGH (ref 70–99)
Glucose-Capillary: 158 mg/dL — ABNORMAL HIGH (ref 70–99)
Glucose-Capillary: 104 mg/dL — ABNORMAL HIGH (ref 70–99)

## 2018-08-24 MED ORDER — ATORVASTATIN CALCIUM 20 MG PO TABS
80.0000 mg | ORAL_TABLET | Freq: Every day | ORAL | Status: DC
  Administered 2018-08-24: 80 mg via ORAL
  Filled 2018-08-24: qty 4

## 2018-08-24 NOTE — Plan of Care (Signed)
  Problem: Education: Goal: Knowledge of General Education information will improve Description: Including pain rating scale, medication(s)/side effects and non-pharmacologic comfort measures Outcome: Progressing   Problem: Health Behavior/Discharge Planning: Goal: Ability to manage health-related needs will improve Outcome: Progressing   Problem: Clinical Measurements: Goal: Ability to maintain clinical measurements within normal limits will improve Outcome: Progressing Goal: Will remain free from infection Outcome: Progressing Goal: Diagnostic test results will improve Outcome: Progressing Goal: Respiratory complications will improve Outcome: Progressing Goal: Cardiovascular complication will be avoided Outcome: Progressing   Problem: Activity: Goal: Risk for activity intolerance will decrease Outcome: Progressing   Problem: Nutrition: Goal: Adequate nutrition will be maintained Outcome: Progressing   Problem: Coping: Goal: Level of anxiety will decrease Outcome: Progressing   Problem: Elimination: Goal: Will not experience complications related to bowel motility Outcome: Progressing Goal: Will not experience complications related to urinary retention Outcome: Progressing   Problem: Pain Managment: Goal: General experience of comfort will improve Outcome: Progressing   Problem: Safety: Goal: Ability to remain free from injury will improve Outcome: Progressing   Problem: Skin Integrity: Goal: Risk for impaired skin integrity will decrease Outcome: Progressing   Problem: Education: Goal: Understanding of cardiac disease, CV risk reduction, and recovery process will improve Outcome: Progressing Goal: Individualized Educational Video(s) Outcome: Progressing   Problem: Activity: Goal: Ability to tolerate increased activity will improve Outcome: Progressing   Problem: Cardiac: Goal: Ability to achieve and maintain adequate cardiovascular perfusion will  improve Outcome: Progressing   Problem: Health Behavior/Discharge Planning: Goal: Ability to safely manage health-related needs after discharge will improve Outcome: Progressing   Problem: Education: Goal: Ability to describe self-care measures that may prevent or decrease complications (Diabetes Survival Skills Education) will improve Outcome: Progressing Goal: Individualized Educational Video(s) Outcome: Progressing   Problem: Coping: Goal: Ability to adjust to condition or change in health will improve Outcome: Progressing   Problem: Fluid Volume: Goal: Ability to maintain a balanced intake and output will improve Outcome: Progressing   Problem: Health Behavior/Discharge Planning: Goal: Ability to identify and utilize available resources and services will improve Outcome: Progressing Goal: Ability to manage health-related needs will improve Outcome: Progressing   Problem: Metabolic: Goal: Ability to maintain appropriate glucose levels will improve Outcome: Progressing   Problem: Nutritional: Goal: Maintenance of adequate nutrition will improve Outcome: Progressing Goal: Progress toward achieving an optimal weight will improve Outcome: Progressing   Problem: Skin Integrity: Goal: Risk for impaired skin integrity will decrease Outcome: Progressing   Problem: Tissue Perfusion: Goal: Adequacy of tissue perfusion will improve Outcome: Progressing   

## 2018-08-24 NOTE — Progress Notes (Addendum)
[facility] [employer] NAME: John Hartman    MR#:  [id number]  DATE OF BIRTH:  1971/04/21  SUBJECTIVE:  CHIEF COMPLAINT:   Chief Complaint  Patient presents with  . Chest Pain  Patient seen and evaluated today No chest pain today No shortness of breath Chronic back pain  REVIEW OF SYSTEMS:    ROS  CONSTITUTIONAL: No documented fever. No fatigue, weakness. No weight gain, no weight loss.  EYES: No blurry or double vision.  ENT: No tinnitus. No postnasal drip. No redness of the oropharynx.  RESPIRATORY: No cough, no wheeze, no hemoptysis. No dyspnea.  CARDIOVASCULAR: No chest pain. No orthopnea. No palpitations. No syncope.  GASTROINTESTINAL: No nausea, no vomiting or diarrhea. No abdominal pain. No melena or hematochezia.  GENITOURINARY: No dysuria or hematuria.  ENDOCRINE: No polyuria or nocturia. No heat or cold intolerance.  HEMATOLOGY: No anemia. No bruising. No bleeding.  INTEGUMENTARY: No rashes. No lesions.  MUSCULOSKELETAL: No arthritis. No swelling. No gout.  NEUROLOGIC: No numbness, tingling, or ataxia. No seizure-type activity.  PSYCHIATRIC: No anxiety. No insomnia. No ADD.   DRUG ALLERGIES:   Allergies  Allergen Reactions  . Lisinopril Swelling  . Ace Inhibitors Swelling    VITALS:  Blood pressure (!) 144/108, pulse 100, temperature 98.7 F (37.1 C), temperature source Oral, resp. rate 18, height 5\' 11"  (1.803 m), weight 104.3 kg, SpO2 94 %.  PHYSICAL EXAMINATION:   Physical Exam  GENERAL:  47 y.o.-year-old patient lying in the bed with no acute distress.  EYES: Pupils equal, round, reactive to light and accommodation. No scleral icterus. Extraocular muscles intact.  HEENT: Head atraumatic, normocephalic. Oropharynx and nasopharynx clear.  NECK:  Supple, no jugular venous distention. No thyroid enlargement, no tenderness.  LUNGS: Normal breath sounds bilaterally, no wheezing, rales, rhonchi. No use of accessory  muscles of respiration.  CARDIOVASCULAR: S1, S2 normal. No murmurs, rubs, or gallops.  ABDOMEN: Soft, nontender, nondistended. Bowel sounds present. No organomegaly or mass.  EXTREMITIES: No cyanosis, clubbing or edema b/l.    NEUROLOGIC: Cranial nerves II through XII are intact. No focal Motor or sensory deficits b/l.   PSYCHIATRIC: The patient is alert and oriented x 3.  SKIN: No obvious rash, lesion, or ulcer.   LABORATORY PANEL:   CBC Recent Labs  Lab 08/24/18 0519  WBC 10.4  HGB 16.5  HCT 48.5  PLT 151   ------------------------------------------------------------------------------------------------------------------ Chemistries  Recent Labs  Lab 08/22/18 2052  08/24/18 0519  NA 140   < > 135  K 3.6   < > 3.7  CL 100   < > 106  CO2 27   < > 22  GLUCOSE 119*   < > 107*  BUN 19   < > 16  CREATININE 1.16   < > 0.81  CALCIUM 9.9   < > 8.6*  AST 211*  --   --   ALT 121*  --   --   ALKPHOS 66  --   --   BILITOT 1.1  --   --    < > = values in this interval not displayed.   ------------------------------------------------------------------------------------------------------------------  Cardiac Enzymes Recent Labs  Lab 08/23/18 1548  TROPONINI 16.49*   ------------------------------------------------------------------------------------------------------------------  RADIOLOGY:  Dg Chest 2 View  Result Date: 08/22/2018 CLINICAL DATA:  Generalized chest pain beginning yesterday. EXAM: CHEST - 2 VIEW COMPARISON:  Chest radiograph-07/17/2008; chest CT-07/18/2008; lumbar spine radiographs-01/02/2018 FINDINGS: Grossly unchanged cardiac silhouette and mediastinal contours. Bilateral  infrahilar heterogeneous opacities are unchanged and favored to represent atelectasis. No new focal airspace opacities. No pleural effusion or pneumothorax. No evidence of edema. No acute osseus abnormalities. Post lower cervical ACDF, incompletely evaluated. Degenerative change of the superior  aspect of the lumbar spine, incompletely evaluated though grossly similar to lumbar spine radiographs performed 12/2017. IMPRESSION: No acute cardiopulmonary disease. Electronically Signed   By: Sandi Mariscal M.D.   On: 08/22/2018 21:02   Ct Chest W Contrast  Result Date: 08/22/2018 CLINICAL DATA:  Generalized chest pain EXAM: CT CHEST WITH CONTRAST TECHNIQUE: Multidetector CT imaging of the chest was performed during intravenous contrast administration. CONTRAST:  68mL OMNIPAQUE IOHEXOL 300 MG/ML  SOLN COMPARISON:  Chest x-ray today.  Chest CT 07/18/2008 FINDINGS: Cardiovascular: Scattered aortic calcifications and coronary artery calcifications. Heart is normal size. Aorta is normal caliber. Mediastinum/Nodes: No mediastinal, hilar, or axillary adenopathy. Lungs/Pleura: Lungs are clear. No focal airspace opacities or suspicious nodules. No effusions. Upper Abdomen: Diffuse fatty infiltration of the liver. No acute findings Musculoskeletal: Chest wall soft tissues are unremarkable. No acute bony abnormality. IMPRESSION: No acute cardiopulmonary disease. Coronary artery disease. Fatty infiltration of the liver. Aortic Atherosclerosis (ICD10-I70.0). Electronically Signed   By: Rolm Baptise M.D.   On: 08/22/2018 23:38     ASSESSMENT AND PLAN:  47 year old male patient with history of type 2 diabetes mellitus, hypertension, hyperlipidemia, spinal stenosis, chronic back pain admitted for non-STEMI.  Patient had cardiac catheterization.  -Non-ST elevation MI Status post PCI Status post stent placement 100% occlusion in the mid circumflex Continue aspirin, statin, Brilinta, beta-blocker  -Coronary artery disease Continue aspirin, statin, Brilinta, beta-blocker  -Uncontrolled hypertension Metoprolol 50 mg orally q 8hrly  -Hyperlipidemia Continue statin medication high intensity  -DVT prophylaxis With subcu Lovenox  All the records are reviewed and case discussed with Care Management/Social  Worker. Management plans discussed with the patient, family and they are in agreement.  CODE STATUS: Full code  DVT Prophylaxis: SCDs  TOTAL TIME TAKING CARE OF THIS PATIENT: 35 minutes.   POSSIBLE D/C IN 1  DAYS, DEPENDING ON CLINICAL CONDITION.  Saundra Shelling M.D on 08/24/2018 at 12:51 PM  Between 7am to 6pm - Pager - [phone]  After 6pm go to [email or web address] - password [email or web address] [facility]  Office  [phone]  CC: Primary care physician; Arnetha Courser, MD  Note: This dictation was prepared with Dragon dictation along with smaller phrase technology. Any transcriptional errors that result from this process are unintentional.

## 2018-08-24 NOTE — Progress Notes (Addendum)
[facility] Cardiology  SUBJECTIVE: John Hartman is a 47 year old male who presented to the ED on 08/22/2018 with a 2-day history of chest pain.  Initial troponin was elevated at 12.46.  Dr. Saralyn Pilar was consulted and emergent cardiac catheterization was performed with PCI of mid circumflex.   Today, the patient reports that he is feeling well.  He had a couple episodes of sharp chest pain on 08/23/2018 that resolved with morphine.  Denies any recurrent episodes since.  Admits to chest soreness, but no pain. Also denies shortness of breath, palpitations, lower extremity swelling.  Denies nausea, vomiting, diarrhea.  Blood pressure is mildly elevated, but patient denies headache or visual changes.   Vitals:   08/24/18 0400 08/24/18 0500 08/24/18 0600 08/24/18 0700  BP: (!) 156/108 (!) 141/106 (!) 136/95 (!) 140/104  Pulse: 75 72 71 73  Resp: 18 (!) 23 20 20   Temp: 98.5 F (36.9 C)   98.6 F (37 C)  TempSrc:      SpO2: 97% 95% 97% 97%  Weight:      Height:         Intake/Output Summary (Last 24 hours) at 08/24/2018 0840 Last data filed at 08/24/2018 0700 Gross per 24 hour  Intake 1465.59 ml  Output 2850 ml  Net -1384.41 ml      PHYSICAL EXAM  General: Well developed, well nourished, in no acute distress HEENT:  Normocephalic and atramatic Neck:  No JVD.  Lungs: Clear bilaterally to auscultation and percussion. Heart: HRRR . Normal S1 and S2 without gallops or murmurs.  Abdomen: Bowel sounds are positive, abdomen soft and non-tender  Msk:  Back normal. Normal strength and tone for age.  Gait not assessed Extremities: No clubbing, cyanosis or edema.  Right groin access site clean and dry.  No evidence of hematoma or drainage.  Mild ecchymosis noted. Neuro: Alert and oriented X 3. Psych:  Good affect, responds appropriately   LABS: Basic Metabolic Panel: Recent Labs    08/23/18 0606 08/24/18 0519  NA 137 135  K 3.6 3.7  CL 100 106  CO2 25 22  GLUCOSE 138* 107*  BUN 16 16   CREATININE 0.88 0.81  CALCIUM 8.7* 8.6*   Liver Function Tests: Recent Labs    08/22/18 2052  AST 211*  ALT 121*  ALKPHOS 66  BILITOT 1.1  PROT 7.9  ALBUMIN 4.9   No results for input(s): LIPASE, AMYLASE in the last 72 hours. CBC: Recent Labs    08/23/18 0606 08/24/18 0519  WBC 12.6* 10.4  HGB 15.7 16.5  HCT 46.8 48.5  MCV 85.1 85.7  PLT 165 151   Cardiac Enzymes: Recent Labs    08/23/18 0304 08/23/18 1227 08/23/18 1548  TROPONINI 9.58* 21.60* 16.49*   BNP: Invalid input(s): POCBNP D-Dimer: No results for input(s): DDIMER in the last 72 hours. Hemoglobin A1C: No results for input(s): HGBA1C in the last 72 hours. Fasting Lipid Panel: No results for input(s): CHOL, HDL, LDLCALC, TRIG, CHOLHDL, LDLDIRECT in the last 72 hours. Thyroid Function Tests: No results for input(s): TSH, T4TOTAL, T3FREE, THYROIDAB in the last 72 hours.  Invalid input(s): FREET3 Anemia Panel: No results for input(s): VITAMINB12, FOLATE, FERRITIN, TIBC, IRON, RETICCTPCT in the last 72 hours.  Dg Chest 2 View  Result Date: 08/22/2018 CLINICAL DATA:  Generalized chest pain beginning yesterday. EXAM: CHEST - 2 VIEW COMPARISON:  Chest radiograph-07/17/2008; chest CT-07/18/2008; lumbar spine radiographs-01/02/2018 FINDINGS: Grossly unchanged cardiac silhouette and mediastinal contours. Bilateral infrahilar heterogeneous opacities are unchanged and favored  to represent atelectasis. No new focal airspace opacities. No pleural effusion or pneumothorax. No evidence of edema. No acute osseus abnormalities. Post lower cervical ACDF, incompletely evaluated. Degenerative change of the superior aspect of the lumbar spine, incompletely evaluated though grossly similar to lumbar spine radiographs performed 12/2017. IMPRESSION: No acute cardiopulmonary disease. Electronically Signed   By: Sandi Mariscal M.D.   On: 08/22/2018 21:02   Ct Chest W Contrast  Result Date: 08/22/2018 CLINICAL DATA:  Generalized chest  pain EXAM: CT CHEST WITH CONTRAST TECHNIQUE: Multidetector CT imaging of the chest was performed during intravenous contrast administration. CONTRAST:  16mL OMNIPAQUE IOHEXOL 300 MG/ML  SOLN COMPARISON:  Chest x-ray today.  Chest CT 07/18/2008 FINDINGS: Cardiovascular: Scattered aortic calcifications and coronary artery calcifications. Heart is normal size. Aorta is normal caliber. Mediastinum/Nodes: No mediastinal, hilar, or axillary adenopathy. Lungs/Pleura: Lungs are clear. No focal airspace opacities or suspicious nodules. No effusions. Upper Abdomen: Diffuse fatty infiltration of the liver. No acute findings Musculoskeletal: Chest wall soft tissues are unremarkable. No acute bony abnormality. IMPRESSION: No acute cardiopulmonary disease. Coronary artery disease. Fatty infiltration of the liver. Aortic Atherosclerosis (ICD10-I70.0). Electronically Signed   By: Rolm Baptise M.D.   On: 08/22/2018 23:38    Echo: pending   ASSESSMENT AND PLAN:  Active Problems:   NSTEMI (non-ST elevated myocardial infarction) ([place])    1. NSTEMI post PCI   -Ambulate today, transfer from ICU to the floor; consider discharge   -Cardiac rehab recommended post discharge   -Discharge on aspirin 81mg , Brilinta 90mg  BID, atorvastatin 80mg , metoprolol 50mg  bid  -Follow up with Dr. Saralyn Pilar outpatient cardiology in 1 week  2.  Hyperlipidemia   -Will continue with high intensity statin therapy   -LDL goal <70 3.  Type 2 Diabetes   -Continue with home medication regimen upon discharge  4.  Hypertension   -Continue with home medications of HCTZ 12.5mg  daily; begin metoprolol 50mg  twice daily, discontinue atenolol   -Follow up with PCP   The history, physical exam findings, and plan of care were all discussed with Dr. Bartholome Bill and all decision-making was made in collaboration  Avie Arenas, PA-C 08/24/2018 8:40 AM   Agree with above. Pt seen and examined.

## 2018-08-24 NOTE — Progress Notes (Signed)
*  PRELIMINARY RESULTS* Echocardiogram 2D Echocardiogram has been performed.  John Hartman John Hartman 08/24/2018, 3:18 PM

## 2018-08-24 NOTE — Progress Notes (Signed)
Pt complaining of "very mild chest tightness" and headache.  EKG performed, tylenol PO and nitroglycerin SL given.  Cardiology notified.

## 2018-08-24 NOTE — Care Management (Signed)
Brilinta voucher provided to ICU Rn to give to patient. Patient transition to telemetry unit today.

## 2018-08-24 NOTE — Progress Notes (Signed)
   08/24/18 0945  Clinical Encounter Type  Visited With Patient and family together  Visit Type Follow-up  Spiritual Encounters  Spiritual Needs Emotional   Chaplain followed up with patient, spouse, and extended family.  Patient and spouse spoke of improvements in patient's condition and expressed appreciation for this chaplain's involvement during patient's admission and cath procedure.  Conversation about their hopes for future steps in healing.  As so many family members were present, chaplain will check back in this afternoon regarding patient/spouse needs regarding support.

## 2018-08-24 NOTE — Progress Notes (Signed)
   08/24/18 1510  Clinical Encounter Type  Visited With Patient and family together  Visit Type Follow-up  Spiritual Encounters  Spiritual Needs Emotional   Chaplain arrived during patient testing, joined spouse and son in conversation.  Older son arrived shortly there after.  Family and chaplain engaged conversation around changes in patient's condition and reason for additional testing.  Chaplain facilitated conversation around self-care and utilized active and reflective listening as family and patient shared thoughts and feelings regarding their relationships and hopes for patient's return home.  Chaplain prayed with patient and family regarding health, healing, and God's will.  Chaplain encouraged family to reach out to chaplain as needed.

## 2018-08-24 NOTE — Progress Notes (Signed)
Echo showed mildly reduced lv funciton with inferolateral hypokinesis c/w mi. RV not well seen. WIll follow on current meds.

## 2018-08-24 NOTE — Progress Notes (Signed)
Patient alert and oriented. On room air. No complaint of chest pain or shortness of breath. Tolerating diet, using urinal. Right groin intact with no bleeding or hematoma.

## 2018-08-25 LAB — URINALYSIS, ROUTINE W REFLEX MICROSCOPIC
BACTERIA UA: NONE SEEN
BILIRUBIN URINE: NEGATIVE
Glucose, UA: >=500 mg/dL — AB
HGB URINE DIPSTICK: NEGATIVE
Ketones, ur: 20 mg/dL — AB
Leukocytes, UA: NEGATIVE
NITRITE: NEGATIVE
PH: 5 (ref 5.0–8.0)
Protein, ur: NEGATIVE mg/dL
Specific Gravity, Urine: 1.027 (ref 1.005–1.030)

## 2018-08-25 LAB — GLUCOSE, CAPILLARY: Glucose-Capillary: 99 mg/dL (ref 70–99)

## 2018-08-25 MED ORDER — ATORVASTATIN CALCIUM 80 MG PO TABS: 80.0000 mg | ORAL_TABLET | Freq: Every day | ORAL | 0 refills | Status: DC

## 2018-08-25 MED ORDER — NITROGLYCERIN 0.4 MG SL SUBL: 0.4000 mg | SUBLINGUAL_TABLET | SUBLINGUAL | 0 refills | Status: AC | PRN

## 2018-08-25 MED ORDER — HYDRALAZINE HCL 25 MG PO TABS: 25.0000 mg | ORAL_TABLET | Freq: Two times a day (BID) | ORAL | 0 refills | Status: AC

## 2018-08-25 MED ORDER — HYDRALAZINE HCL 25 MG PO TABS
25.0000 mg | ORAL_TABLET | Freq: Two times a day (BID) | ORAL | Status: DC
  Administered 2018-08-25: 25 mg via ORAL
  Filled 2018-08-25 (×2): qty 1

## 2018-08-25 MED ORDER — METOPROLOL TARTRATE 50 MG PO TABS: 50.0000 mg | ORAL_TABLET | Freq: Two times a day (BID) | ORAL | 0 refills | Status: DC

## 2018-08-25 MED ORDER — TICAGRELOR 90 MG PO TABS: 90.0000 mg | ORAL_TABLET | Freq: Two times a day (BID) | ORAL | 0 refills | Status: AC

## 2018-08-25 MED ORDER — ATENOLOL 50 MG PO TABS: 50.0000 mg | ORAL_TABLET | Freq: Two times a day (BID) | ORAL | 0 refills | Status: DC

## 2018-08-25 NOTE — Progress Notes (Addendum)
Patient Name: John Hartman Date of Encounter: 08/25/2018  Hospital Problem List     Active Problems:   NSTEMI (non-ST elevated myocardial infarction) [facility])    Patient Profile     47 year old status post PCI.  Doing better this morning.  No significant chest pain.  Subjective   Feels better this morning with no chest pain.  Inpatient Medications    . allopurinol  100 mg Oral BH-q7a  . aspirin  81 mg Oral Daily  . atorvastatin  80 mg Oral q1800  . diphenhydrAMINE  50 mg Oral QHS  . docusate sodium  100 mg Oral BID  . famotidine  10 mg Oral Daily  . hydrALAZINE  25 mg Oral BID  . insulin aspart  0-5 Units Subcutaneous QHS  . insulin aspart  0-9 Units Subcutaneous TID WC  . insulin glargine  24 Units Subcutaneous QHS  . lidocaine  1 patch Transdermal Q24H  . loratadine  10 mg Oral Daily  . Melatonin  5 mg Oral QHS  . metoprolol tartrate  50 mg Oral TID  . pregabalin  50 mg Oral TID  . sodium chloride flush  3 mL Intravenous Q12H  . sodium chloride flush  3 mL Intravenous Q12H  . ticagrelor  90 mg Oral BID    Vital Signs    Vitals:   08/24/18 1639 08/24/18 1929 08/25/18 0459 08/25/18 0731  BP: (!) 147/115 (!) 160/128 (!) 132/99 (!) 135/103  Pulse: 93 (!) 116 92 72  Resp: 20 18 18 20   Temp: 99.2 F (37.3 C) 98.8 F (37.1 C) 98.4 F (36.9 C) 98.5 F (36.9 C)  TempSrc: Oral Oral Oral Oral  SpO2: 100% 96% 99% 98%  Weight:   100.2 kg   Height:        Intake/Output Summary (Last 24 hours) at 08/25/2018 0901 Last data filed at 08/25/2018 0856 Gross per 24 hour  Intake 3 ml  Output 2300 ml  Net -2297 ml   Filed Weights   08/22/18 2047 08/25/18 0459  Weight: 104.3 kg 100.2 kg    Physical Exam    GEN: Well nourished, well developed, in no acute distress.  HEENT: normal.  Neck: Supple, no JVD, carotid bruits, or masses. Cardiac: RRR, no murmurs, rubs, or gallops. No clubbing, cyanosis, edema.  Radials/DP/PT 2+ and equal bilaterally.  Respiratory:   Respirations regular and unlabored, clear to auscultation bilaterally. GI: Soft, nontender, nondistended, BS + x 4. MS: no deformity or atrophy. Skin: warm and dry, no rash. Neuro:  Strength and sensation are intact. Psych: Normal affect.  Labs    CBC Recent Labs    08/23/18 0606 08/24/18 0519  WBC 12.6* 10.4  HGB 15.7 16.5  HCT 46.8 48.5  MCV 85.1 85.7  PLT 165 237   Basic Metabolic Panel Recent Labs    08/23/18 0606 08/24/18 0519  NA 137 135  K 3.6 3.7  CL 100 106  CO2 25 22  GLUCOSE 138* 107*  BUN 16 16  CREATININE 0.88 0.81  CALCIUM 8.7* 8.6*   Liver Function Tests Recent Labs    08/22/18 2052  AST 211*  ALT 121*  ALKPHOS 66  BILITOT 1.1  PROT 7.9  ALBUMIN 4.9   No results for input(s): LIPASE, AMYLASE in the last 72 hours. Cardiac Enzymes Recent Labs    08/23/18 0304 08/23/18 1227 08/23/18 1548  TROPONINI 9.58* 21.60* 16.49*   BNP No results for input(s): BNP in the last 72 hours. D-Dimer No results  for input(s): DDIMER in the last 72 hours. Hemoglobin A1C No results for input(s): HGBA1C in the last 72 hours. Fasting Lipid Panel No results for input(s): CHOL, HDL, LDLCALC, TRIG, CHOLHDL, LDLDIRECT in the last 72 hours. Thyroid Function Tests No results for input(s): TSH, T4TOTAL, T3FREE, THYROIDAB in the last 72 hours.  Invalid input(s): FREET3  Telemetry    Normal sinus rhythm.  ECG  Sinus rhythm with evolving myocardial infarction.  Radiology    Dg Chest 2 View  Result Date: 08/22/2018 CLINICAL DATA:  Generalized chest pain beginning yesterday. EXAM: CHEST - 2 VIEW COMPARISON:  Chest radiograph-07/17/2008; chest CT-07/18/2008; lumbar spine radiographs-01/02/2018 FINDINGS: Grossly unchanged cardiac silhouette and mediastinal contours. Bilateral infrahilar heterogeneous opacities are unchanged and favored to represent atelectasis. No new focal airspace opacities. No pleural effusion or pneumothorax. No evidence of edema. No acute  osseus abnormalities. Post lower cervical ACDF, incompletely evaluated. Degenerative change of the superior aspect of the lumbar spine, incompletely evaluated though grossly similar to lumbar spine radiographs performed 12/2017. IMPRESSION: No acute cardiopulmonary disease. Electronically Signed   By: Sandi Mariscal M.D.   On: 08/22/2018 21:02   Ct Chest W Contrast  Result Date: 08/22/2018 CLINICAL DATA:  Generalized chest pain EXAM: CT CHEST WITH CONTRAST TECHNIQUE: Multidetector CT imaging of the chest was performed during intravenous contrast administration. CONTRAST:  16mL OMNIPAQUE IOHEXOL 300 MG/ML  SOLN COMPARISON:  Chest x-ray today.  Chest CT 07/18/2008 FINDINGS: Cardiovascular: Scattered aortic calcifications and coronary artery calcifications. Heart is normal size. Aorta is normal caliber. Mediastinum/Nodes: No mediastinal, hilar, or axillary adenopathy. Lungs/Pleura: Lungs are clear. No focal airspace opacities or suspicious nodules. No effusions. Upper Abdomen: Diffuse fatty infiltration of the liver. No acute findings Musculoskeletal: Chest wall soft tissues are unremarkable. No acute bony abnormality. IMPRESSION: No acute cardiopulmonary disease. Coronary artery disease. Fatty infiltration of the liver. Aortic Atherosclerosis (ICD10-I70.0). Electronically Signed   By: Rolm Baptise M.D.   On: 08/22/2018 23:38   Mr Lumbar Spine Wo Contrast  Result Date: 08/01/2018 CLINICAL DATA:  Chronic low back pain, worse last month. Lumbar radiculopathy EXAM: MRI LUMBAR SPINE WITHOUT CONTRAST TECHNIQUE: Multiplanar, multisequence MR imaging of the lumbar spine was performed. No intravenous contrast was administered. COMPARISON:  Radiography 01/02/2018 5 lumbar type vertebral bodies FINDINGS: Segmentation:  5 lumbar type vertebral bodies Alignment: Reversal of normal lumbar lordosis at L1-2. Slight retrolisthesis at L1-2, L2-3, and L4-5. Vertebrae: Degenerative endplate marrow alteration at L1-2. No fracture,  discitis, or aggressive bone lesion Conus medullaris and cauda equina: Conus extends to the L1 level. Conus and cauda equina appear normal. Paraspinal and other soft tissues: Negative Disc levels: T12- L1: Unremarkable. L1-L2: Spondylosis and disc narrowing with endplate degeneration. Central disc protrusion at an annular fissure with ventral thecal sac flattening. Spinal stenosis is mild. L2-L3: Spondylosis and disc narrowing with bulge and annular fissure. Ventral thecal sac flattening. Spinal stenosis is mild. L3-L4: Disc narrowing and mild bulging. Bilateral foraminal to extra-foraminal protrusion, larger on the left, which could irritate the extraforaminal L3 nerve roots. Mild spinal stenosis with ventral thecal sac flattening L4-L5: Disc narrowing and bulging with a broad central down turning protrusion. Spinal stenosis is moderate. Mild to moderate left foraminal narrowing. L5-S1:Mild facet spurring and disc bulging. Posterior annular fissures. IMPRESSION: 1. Multilevel degenerative disc bulge and protrusion as described. 2. Spinal stenosis is moderate at L4-5 and mild at L1-2, L2-3, and L3-4. 3. L3-4 left larger than right extraforaminal disc protrusion which could irritate the bilateral L3 nerve roots.  4. Mild-to-moderate L4-5 left foraminal narrowing. Electronically Signed   By: Monte Fantasia M.D.   On: 08/01/2018 16:01   Dg C-arm 1-60 Min-no Report  Result Date: 08/20/2018 Fluoroscopy was utilized by the requesting physician.  No radiographic interpretation.    Assessment & Plan  Doing better this morning with no further chest pain.  Continue with dual antiplatelet therapy with aspirin and NSAID.  Continue with metoprolol 50 twice daily.  Ambulate today and follow for stability.  Stable with no further chest pain may discharge to home with outpatient follow-up on current regimen.  Signed, Javier Docker Luz [name] MD 08/25/2018, 9:01 AM  Pager: [phone]

## 2018-08-25 NOTE — Discharge Summary (Signed)
[facility] [place] NAME: John Hartman    MR#:  [id number]  DATE OF BIRTH:  09/14/1971  DATE OF ADMISSION:  08/22/2018 ADMITTING PHYSICIAN: Amelia Jo, MD  DATE OF DISCHARGE: 08/25/2018  PRIMARY CARE PHYSICIAN: Arnetha Courser, MD   ADMISSION DIAGNOSIS:  Panic attack [F41.0] NSTEMI (non-ST elevated myocardial infarction) ([place]) [I21.4] Diabetes mellitus type 2 Hypertension Spinal stenosis History of angioedema DISCHARGE DIAGNOSIS:  Active Problems:   NSTEMI (non-ST elevated myocardial infarction) ([place]) Type 2 diabetes mellitus Hypertension Spinal stenosis Chronic back pain  SECONDARY DIAGNOSIS:   Past Medical History:  Diagnosis Date  . Angioedema   . Bronchitis   . DDD (degenerative disc disease), cervical   . Diabetes mellitus ([place])   . Fatty liver 03/31/2017   [place] April 2018  . GERD (gastroesophageal reflux disease)   . Gout   . HTN (hypertension)   . Hypertension    CONTROLLED ON MEDS  . IBS (irritable bowel syndrome)   . Seasonal allergies   . Spinal stenosis      ADMITTING HISTORY Tel Hevia  is a 47 y.o. male with a known history of diabetes type 2, fatty liver, former smoker, hypertension, hyperlipidemia, spinal stenosis and other comorbidities.  Patient also has strong family history of early coronary artery disease.He presented to emergency room for acute onset of severe, constant retrosternal chest pain with radiation to both arms.  This chest pain started yesterday and did not improve with narcotics or acupuncture.  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  The chest pain improved in the emergency room with nitroglycerin drip.No fever, chills, cough, no nausea, diaphoresis. Blood test done emergency room are notable for elevated WBC at 12.8 and elevated troponin level at 12.46.EKG  shows normal sinus rhythm with heart rate at 76.  There is 1 mm ST depression in V2 and 0.25 millimeter ST depression in V3, but no STEMI.Chest x-ray is negative for any acute cardiopulmonary disease.CT scan of the chest with contrast is negative for any acute cardiopulmonary disease, but it shows coronary artery disease, aortic atherosclerosis and fatty infiltration of the liver. Patient is admitted for further evaluation and treatment.  HOSPITAL COURSE:  Was admitted to stepdown unit.  He was evaluated by cardiology and was started on aspirin, statin medication along with nitrates and beta-blocker.  Patient was put on IV heparin drip for anticoagulation.  Patient underwent cardiac catheterization and stent were placed in the mid circumflex.  Patient was started on oral Brilinta and continued statin medication, beta-blocker and aspirin.  He was transferred to telemetry.  His blood pressure medications were optimized.  He was started on high intensity statin.  Patient has been enrolled in cardiac rehab program.  Lifestyle modification and dietary counseling given to the patient.  Was seen and followed by cardiology during the stay in the hospital.  Patient will be discharged to home follow-up with cardiology in the clinic.  CONSULTS OBTAINED:  Treatment Team:  Isaias Cowman, MD  DRUG ALLERGIES:   Allergies  Allergen Reactions  . Lisinopril Swelling  . Ace Inhibitors Swelling    DISCHARGE MEDICATIONS:   Allergies as of 08/25/2018      Reactions   Lisinopril Swelling   Ace Inhibitors Swelling      Medication List    STOP taking these medications   atenolol 50  MG tablet Commonly known as:  TENORMIN   lidocaine 5 % Commonly known as:  LIDODERM     TAKE these medications   ACCU-CHEK SOFTCLIX LANCETS lancets USE AS DIRECTED   allopurinol 100 MG tablet Commonly known as:  ZYLOPRIM Take 100 mg by mouth every morning.   aspirin EC 81 MG tablet Take 81 mg by mouth daily.    atorvastatin 80 MG tablet Commonly known as:  LIPITOR Take 1 tablet (80 mg total) by mouth daily at 6 PM.   bell[place] alk-PHENObarbital 16.2 MG tablet Commonly known as:  DONNATAL Take 1 tablet by mouth as needed (pain).   cetirizine 10 MG tablet Commonly known as:  ZYRTEC Take 10 mg by mouth at bedtime.   colchicine 0.6 MG tablet Take 0.6 mg by mouth daily as needed (gout).   diphenhydrAMINE 25 mg capsule Commonly known as:  BENADRYL Take 50 mg by mouth at bedtime.   EPIPEN 2-PAK 0.3 mg/0.3 mL Soaj injection Generic drug:  EPINEPHrine Inject 0.3 mg into the muscle once.   glucose blood test strip Use as instructed   glucose blood test strip Check FSBS three to four times per day   hydrALAZINE 25 MG tablet Commonly known as:  APRESOLINE Take 1 tablet (25 mg total) by mouth 2 (two) times daily.   hydrochlorothiazide 12.5 MG tablet Commonly known as:  HYDRODIURIL Take 12.5 mg by mouth daily.   indomethacin 25 MG capsule Commonly known as:  INDOCIN Take 25 mg by mouth 2 (two) times daily as needed for mild pain.   Insulin Pen Needle 31G X 5 MM Misc Once a day with insulin   Melatonin 5 MG Tabs Take 5 mg by mouth at bedtime.   metoprolol tartrate 50 MG tablet Commonly known as:  LOPRESSOR Take 1 tablet (50 mg total) by mouth 2 (two) times daily.   nitroGLYCERIN 0.4 MG SL tablet Commonly known as:  NITROSTAT Place 1 tablet (0.4 mg total) under the tongue every 5 (five) minutes as needed for chest pain.   oxyCODONE-acetaminophen 7.5-325 MG tablet Commonly known as:  PERCOCET Take 1 tablet by mouth 3 (three) times daily as needed for severe pain.   pregabalin 50 MG capsule Commonly known as:  LYRICA Take 1 capsule (50 mg total) by mouth 3 (three) times daily.   ranitidine 150 MG capsule Commonly known as:  ZANTAC Take 150 mg by mouth at bedtime.   ticagrelor 90 MG Tabs tablet Commonly known as:  BRILINTA Take 1 tablet (90 mg total) by mouth 2 (two)  times daily.   tiZANidine 4 MG tablet Commonly known as:  ZANAFLEX Take 1 tablet (4 mg total) by mouth every 8 (eight) hours as needed for muscle spasms.   TRESIBA FLEXTOUCH 100 UNIT/ML Sopn FlexTouch Pen Generic drug:  insulin degludec Inject 34 Units into the skin at bedtime.   XIGDUO XR 04-999 MG Tb24 Generic drug:  Dapagliflozin-metFORMIN HCl ER Take 2 tablets by mouth daily.       Today  Patient seen and evaluated today No chest pain Shortness of breath No palpitations  VITAL SIGNS:  Blood pressure (!) 131/98, pulse 87, temperature 98.5 F (36.9 C), temperature source Oral, resp. rate 20, height '5\' 11"'  (1.803 m), weight 100.2 kg, SpO2 98 %.  I/O:    Intake/Output Summary (Last 24 hours) at 08/25/2018 1244 Last data filed at 08/25/2018 0905 Gross per 24 hour  Intake 9 ml  Output 1700 ml  Net -1691 ml  PHYSICAL EXAMINATION:  Physical Exam  GENERAL:  47 y.o.-year-old patient lying in the bed with no acute distress.  LUNGS: Normal breath sounds bilaterally, no wheezing, rales,rhonchi or crepitation. No use of accessory muscles of respiration.  CARDIOVASCULAR: S1, S2 normal. No murmurs, rubs, or gallops.  ABDOMEN: Soft, non-tender, non-distended. Bowel sounds present. No organomegaly or mass.  NEUROLOGIC: Moves all 4 extremities. PSYCHIATRIC: The patient is alert and oriented x 3.  SKIN: No obvious rash, lesion, or ulcer.   DATA REVIEW:   CBC Recent Labs  Lab 08/24/18 0519  WBC 10.4  HGB 16.5  HCT 48.5  PLT 151    Chemistries  Recent Labs  Lab 08/22/18 2052  08/24/18 0519  NA 140   < > 135  K 3.6   < > 3.7  CL 100   < > 106  CO2 27   < > 22  GLUCOSE 119*   < > 107*  BUN 19   < > 16  CREATININE 1.16   < > 0.81  CALCIUM 9.9   < > 8.6*  AST 211*  --   --   ALT 121*  --   --   ALKPHOS 66  --   --   BILITOT 1.1  --   --    < > = values in this interval not displayed.    Cardiac Enzymes Recent Labs  Lab 08/23/18 1548  TROPONINI 16.49*     Microbiology Results  Results for orders placed or performed during the hospital encounter of 08/22/18  MRSA PCR Screening     Status: None   Collection Time: 08/23/18  6:34 AM  Result Value Ref Range Status   MRSA by PCR NEGATIVE NEGATIVE Final    Comment:        The GeneXpert MRSA Assay (FDA approved for NASAL specimens only), is one component of a comprehensive MRSA colonization surveillance program. It is not intended to diagnose MRSA infection nor to guide or monitor treatment for MRSA infections. Performed at [facility], [address] [place], [place] [address]     RADIOLOGY:  No results found.  Follow up with PCP in 1 week.  Management plans discussed with the patient, family and they are in agreement.  CODE STATUS: Full code    Code Status Orders  (From admission, onward)         Start     Ordered   08/23/18 0726  Full code  Continuous     08/23/18 0725        Code Status History    Date Active Date Inactive Code Status Order ID Comments User Context   08/23/2018 0550 08/23/2018 0725 Full Code [id number]  Amelia Jo, MD Inpatient   08/27/2016 2244 08/28/2016 1722 Full Code [id number]  Hugelmeyer, Ubaldo Glassing, DO Inpatient      TOTAL TIME TAKING CARE OF THIS PATIENT ON DAY OF DISCHARGE: more than 35 minutes.   Saundra Shelling M.D on 08/25/2018 at 12:44 PM  Between 7am to 6pm - Pager - [phone]  After 6pm go to [email or web address] - password [email or web address] [facility]  Office  [phone]  CC: Primary care physician; Arnetha Courser, MD  Note: This dictation was prepared with Dragon dictation along with smaller phrase technology. Any transcriptional errors that result from this process are unintentional.

## 2018-08-25 NOTE — Plan of Care (Signed)
  Problem: Education: Goal: Knowledge of General Education information will improve Description: Including pain rating scale, medication(s)/side effects and non-pharmacologic comfort measures Outcome: Adequate for Discharge   Problem: Health Behavior/Discharge Planning: Goal: Ability to manage health-related needs will improve Outcome: Adequate for Discharge   Problem: Clinical Measurements: Goal: Ability to maintain clinical measurements within normal limits will improve Outcome: Adequate for Discharge Goal: Will remain free from infection Outcome: Adequate for Discharge Goal: Diagnostic test results will improve Outcome: Adequate for Discharge Goal: Respiratory complications will improve Outcome: Adequate for Discharge Goal: Cardiovascular complication will be avoided Outcome: Adequate for Discharge   Problem: Activity: Goal: Risk for activity intolerance will decrease Outcome: Adequate for Discharge   Problem: Nutrition: Goal: Adequate nutrition will be maintained Outcome: Adequate for Discharge   Problem: Coping: Goal: Level of anxiety will decrease Outcome: Adequate for Discharge   Problem: Elimination: Goal: Will not experience complications related to bowel motility Outcome: Adequate for Discharge Goal: Will not experience complications related to urinary retention Outcome: Adequate for Discharge   Problem: Pain Managment: Goal: General experience of comfort will improve Outcome: Adequate for Discharge   Problem: Safety: Goal: Ability to remain free from injury will improve Outcome: Adequate for Discharge   Problem: Skin Integrity: Goal: Risk for impaired skin integrity will decrease Outcome: Adequate for Discharge   Problem: Education: Goal: Understanding of cardiac disease, CV risk reduction, and recovery process will improve Outcome: Adequate for Discharge Goal: Individualized Educational Video(s) Outcome: Adequate for Discharge   Problem:  Activity: Goal: Ability to tolerate increased activity will improve Outcome: Adequate for Discharge   Problem: Cardiac: Goal: Ability to achieve and maintain adequate cardiovascular perfusion will improve Outcome: Adequate for Discharge   Problem: Health Behavior/Discharge Planning: Goal: Ability to safely manage health-related needs after discharge will improve Outcome: Adequate for Discharge   Problem: Education: Goal: Ability to describe self-care measures that may prevent or decrease complications (Diabetes Survival Skills Education) will improve Outcome: Adequate for Discharge Goal: Individualized Educational Video(s) Outcome: Adequate for Discharge   Problem: Coping: Goal: Ability to adjust to condition or change in health will improve Outcome: Adequate for Discharge   Problem: Fluid Volume: Goal: Ability to maintain a balanced intake and output will improve Outcome: Adequate for Discharge   Problem: Health Behavior/Discharge Planning: Goal: Ability to identify and utilize available resources and services will improve Outcome: Adequate for Discharge Goal: Ability to manage health-related needs will improve Outcome: Adequate for Discharge   Problem: Metabolic: Goal: Ability to maintain appropriate glucose levels will improve Outcome: Adequate for Discharge   Problem: Nutritional: Goal: Maintenance of adequate nutrition will improve Outcome: Adequate for Discharge Goal: Progress toward achieving an optimal weight will improve Outcome: Adequate for Discharge   Problem: Skin Integrity: Goal: Risk for impaired skin integrity will decrease Outcome: Adequate for Discharge   Problem: Tissue Perfusion: Goal: Adequacy of tissue perfusion will improve Outcome: Adequate for Discharge   

## 2018-08-25 NOTE — Progress Notes (Signed)
Advanced care plan.  Purpose of the Encounter: CODE STATUS  Parties in Attendance: Patient and family  Patient's Decision Capacity: Good  Subjective/Patient's story: Presented to emergency room with chest pain   Objective/Medical story Has non-ST elevation MI Needs cardiac catheterization and work-up   Goals of care determination:  Advance care directives goals of care discussed with the patient Patient wants everything done which includes CPR, intubation ventilator if the need arises   CODE STATUS: Full code   Time spent discussing advanced care planning: 16 minutes

## 2018-08-25 NOTE — Progress Notes (Signed)
Patient given discharge instructions with wife at bedside. IV's taken out and tele monitor off. Prescription also given. Patient and wife verbalized understanding with no further questions. Patient going home via family vehicle.

## 2018-08-28 ENCOUNTER — Telehealth: Payer: Self-pay

## 2018-08-28 NOTE — Telephone Encounter (Signed)
TOC #1. Called pt to f/u after d/c from [facility] on 08/25/18. Also wanted to confirm his hosp f/u appt w/ Suezanne Cheshire, NP on 08/30/18 @ 11am. Discharge planning includes the following:  - Begin Lipitor, hydralazine, Lopressor, Nitrostat and Brilinta - Stop atenolol and Lidoderm - f/u Dr. Sanda Klein 1 week - f/u Dr. Saralyn Pilar 2 weeks - f/u Cardiac Rehab 1 week - Begin heart healthy diet LVM requesting returned call.

## 2018-08-29 ENCOUNTER — Telehealth: Payer: Self-pay

## 2018-08-29 NOTE — Telephone Encounter (Signed)
EMMI Follow-up: Noted on the report that patient did not have any follow-up appointments.  I talked with John Hartman and he has read over his discharge paperwork and has made some of the follow-up appointments and aware of the others already made.  He said he was just taking it easy right now so no other needs noted.  I let him know there would be a second automated call with a different series of questions and to let us know if he had any concerns at that time.

## 2018-08-30 ENCOUNTER — Encounter: Payer: Self-pay | Admitting: Nurse Practitioner

## 2018-08-30 ENCOUNTER — Ambulatory Visit: Payer: 59 | Admitting: Nurse Practitioner

## 2018-08-30 VITALS — BP 100/80 | HR 85 | Temp 97.9°F | Resp 16 | Ht 71.0 in | Wt 220.3 lb

## 2018-08-30 DIAGNOSIS — F410 Panic disorder [episodic paroxysmal anxiety] without agoraphobia: Secondary | ICD-10-CM

## 2018-08-30 DIAGNOSIS — I10 Essential (primary) hypertension: Secondary | ICD-10-CM

## 2018-08-30 DIAGNOSIS — I700 Atherosclerosis of aorta: Secondary | ICD-10-CM

## 2018-08-30 DIAGNOSIS — I214 Non-ST elevation (NSTEMI) myocardial infarction: Secondary | ICD-10-CM | POA: Diagnosis not present

## 2018-08-30 DIAGNOSIS — Z09 Encounter for follow-up examination after completed treatment for conditions other than malignant neoplasm: Secondary | ICD-10-CM

## 2018-08-30 DIAGNOSIS — E785 Hyperlipidemia, unspecified: Secondary | ICD-10-CM

## 2018-08-30 DIAGNOSIS — R748 Abnormal levels of other serum enzymes: Secondary | ICD-10-CM

## 2018-08-30 LAB — COMPLETE METABOLIC PANEL WITH GFR
AG RATIO: 1.7 (calc) (ref 1.0–2.5)
ALBUMIN MSPROF: 4.5 g/dL (ref 3.6–5.1)
ALT: 103 U/L — ABNORMAL HIGH (ref 9–46)
AST: 85 U/L — AB (ref 10–40)
Alkaline phosphatase (APISO): 91 U/L (ref 40–115)
BUN: 18 mg/dL (ref 7–25)
CALCIUM: 9.8 mg/dL (ref 8.6–10.3)
CO2: 27 mmol/L (ref 20–32)
Chloride: 99 mmol/L (ref 98–110)
Creat: 1.08 mg/dL (ref 0.60–1.35)
GFR, EST NON AFRICAN AMERICAN: 82 mL/min/{1.73_m2} (ref >=60)
GFR, Est African American: 96 mL/min/{1.73_m2} (ref >=60)
GLOBULIN: 2.7 g/dL (ref 1.9–3.7)
Glucose, Bld: 96 mg/dL (ref 65–139)
POTASSIUM: 4 mmol/L (ref 3.5–5.3)
SODIUM: 138 mmol/L (ref 135–146)
Total Bilirubin: 1 mg/dL (ref 0.2–1.2)
Total Protein: 7.2 g/dL (ref 6.1–8.1)

## 2018-08-30 LAB — LIPID PANEL
CHOL/HDL RATIO: 5.7 (calc) — AB (ref <5.0)
CHOLESTEROL: 147 mg/dL (ref <200)
HDL: 26 mg/dL — AB (ref >40)
LDL CHOLESTEROL (CALC): 98 mg/dL
Non-HDL Cholesterol (Calc): 121 mg/dL (calc) (ref <130)
Triglycerides: 133 mg/dL (ref <150)

## 2018-08-30 NOTE — Progress Notes (Signed)
Name: John Hartman   MRN: [id number]    DOB: 09-Oct-1971   Date:08/30/2018       Progress Note  Subjective  Chief Complaint  Chief Complaint  Patient presents with  . Hospitalization Follow-up    Had a massive heart attack    HPI  Patient presents for hospital follow-up. Admitted to [facility] on 8/28 for STEMI and discharged on 8/31. Patient initialy admitted with NSTEMI and then had acute episode of chest pain while awaiting admission room and noted to have increased chest pain. He was taken to the cath lab. Cardiac catheterization results revealed  occluded distal dominant left circumflex. Normal left ventricular function Successful primary PCI. Started on BB, statin, brilinta and aspirin. Enrolled in cards rehab program.   [facility] phone call on 9/3 by Ammie Eversole  LPN  TOC #1. Called pt to f/u after d/c from [facility] on 08/25/18. Also wanted to confirm his hosp f/u appt w/ Suezanne Cheshire, NP on 08/30/18 @ 11am. Discharge planning includes the following:  - Begin Lipitor, hydralazine, Lopressor, Nitrostat and Brilinta - Stop atenolol and Lidoderm - f/u Dr. Sanda Klein 1 week - f/u Dr. Saralyn Pilar 2 weeks - f/u Cardiac Rehab 1 week - Begin heart healthy diet LVM requesting returned call.  Follow up with cards tomorrow. States has not had chest pain since leaving the hospital. Noticed fatigue and exertional shortness of breath but is improving some every day. Is expecting to be contacted by cardiac rehab. Has not needed nitroglycerin. States he had diarrhea yesterday but has resolved today; thinks it may be related to brilinta or diet changed- no abdominal pain.  Follows up with Dr. Gabriel Carina for diabetes- last A1C was 7.1 on 08/10/2018 Follows up with Dr. Holley Raring for pain management   Patient Active Problem List   Diagnosis Date Noted  . NSTEMI (non-ST elevated myocardial infarction) ([place]) 08/22/2018  . Elevated hemoglobin ([place]) 08/11/2017  . Chronic lower back pain 08/04/2017  . Degenerative disc disease,  lumbar 08/04/2017  . 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 Calcification of abdominal aorta (HCC) 08/04/2017  . Fatty liver 03/31/2017  . Elevated liver enzymes 02/11/2017  . Serum total bilirubin elevated 02/11/2017  . Hyperlipidemia LDL goal <70 01/04/2017  . Elevated serum glutamic pyruvic transaminase (SGPT) level 01/04/2017  . Angioedema   . Encounter for medication monitoring 08/25/2016  . Coronary artery disease 08/25/2016  . Diabetes mellitus ([place])   . Chest pain 08/08/2016  . Preventative health care 08/02/2016  . Ketonuria 07/12/2016  . Glucosuria 07/12/2016  . 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 Palpitations 08/12/2015  . Family history of malignant neoplasm of gastrointestinal tract   . Benign neoplasm of rectosigmoid junction   . Essential hypertension 05/28/2015  . Gout 05/28/2015  . IBS (irritable bowel syndrome) 05/28/2015    Past Medical History:  Diagnosis Date  . Angioedema   . Bronchitis   . DDD (degenerative disc disease), cervical   . Diabetes mellitus ([place])   . Fatty liver 03/31/2017   [place] April 2018  . GERD (gastroesophageal reflux disease)   . Gout   . HTN (hypertension)   . Hypertension    CONTROLLED ON MEDS  . IBS (irritable bowel syndrome)   . Seasonal allergies   . Spinal stenosis     Past Surgical History:  Procedure Laterality Date  . ACDF with fusion  2007  . CARDIAC CATHETERIZATION Left 08/15/2016   Procedure: Left Heart Cath and Coronary Angiography;  Surgeon: Wellington Hampshire, MD;  Location: [facility]  INVASIVE CV LAB;  Service: Cardiovascular;  Laterality: Left;  . COLONOSCOPY WITH PROPOFOL N/A 06/26/2015   Procedure: COLONOSCOPY WITH PROPOFOL;  Surgeon: Lucilla Lame, MD;  Location: [facility];  Service: Endoscopy;  Laterality: N/A;  with biopsies  . CORONARY/GRAFT ACUTE MI REVASCULARIZATION N/A 08/23/2018   Procedure: Coronary/Graft Acute MI Revascularization;  Surgeon: Isaias Cowman, MD;  Location: [place] CV LAB;  Service:  Cardiovascular;  Laterality: N/A;  . LEFT HEART CATH AND CORONARY ANGIOGRAPHY N/A 08/23/2018   Procedure: LEFT HEART CATH AND CORONARY ANGIOGRAPHY;  Surgeon: Isaias Cowman, MD;  Location: [place] CV LAB;  Service: Cardiovascular;  Laterality: N/A;  . NASAL SEPTUM SURGERY  2004  . SHOULDER SURGERY Left 2007  . Testicular torsion  1980s  . VASECTOMY      Social History   Tobacco Use  . Smoking status: Former Smoker    Packs/day: 1.00    Years: 10.00    Pack years: 10.00    Types: Cigarettes    Last attempt to quit: 04/15/2015    Years since quitting: 3.3  . Smokeless tobacco: Never Used  Substance Use Topics  . Alcohol use: No    Alcohol/week: 0.0 standard drinks     Current Outpatient Medications:  .  ACCU-CHEK SOFTCLIX LANCETS lancets, USE AS DIRECTED, Disp: 100 each, Rfl: 0 .  allopurinol (ZYLOPRIM) 100 MG tablet, Take 100 mg by mouth every morning. , Disp: , Rfl:  .  aspirin EC 81 MG tablet, Take 81 mg by mouth daily., Disp: , Rfl:  .  atorvastatin (LIPITOR) 80 MG tablet, Take 1 tablet (80 mg total) by mouth daily at 6 PM., Disp: 30 tablet, Rfl: 0 .  [place] alk-PHENObarbital (DONNATAL) 16.2 MG tablet, Take 1 tablet by mouth as needed (pain)., Disp: , Rfl:  .  cetirizine (ZYRTEC) 10 MG tablet, Take 10 mg by mouth at bedtime. , Disp: , Rfl:  .  colchicine 0.6 MG tablet, Take 0.6 mg by mouth daily as needed (gout). , Disp: , Rfl:  .  Dapagliflozin-metFORMIN HCl ER (XIGDUO XR) 04-999 MG TB24, Take 2 tablets by mouth daily. , Disp: , Rfl:  .  diphenhydrAMINE (BENADRYL) 25 mg capsule, Take 50 mg by mouth at bedtime. , Disp: , Rfl:  .  EPINEPHrine (EPIPEN 2-PAK) 0.3 mg/0.3 mL IJ SOAJ injection, Inject 0.3 mg into the muscle once., Disp: , Rfl:  .  glucose blood (ACCU-CHEK ACTIVE STRIPS) test strip, Use as instructed, Disp: 100 each, Rfl: 12 .  glucose blood (ACCU-CHEK AVIVA PLUS) test strip, Check FSBS three to four times per day, Disp: 100 each, Rfl: 1 .  hydrALAZINE  (APRESOLINE) 25 MG tablet, Take 1 tablet (25 mg total) by mouth 2 (two) times daily., Disp: 60 tablet, Rfl: 0 .  hydrochlorothiazide (HYDRODIURIL) 12.5 MG tablet, Take 12.5 mg by mouth daily., Disp: , Rfl:  .  indomethacin (INDOCIN) 25 MG capsule, Take 25 mg by mouth 2 (two) times daily as needed for mild pain., Disp: , Rfl:  .  Insulin Degludec (TRESIBA FLEXTOUCH) 100 UNIT/ML SOPN, Inject 34 Units into the skin at bedtime. , Disp: 1 pen, Rfl: 0 .  Insulin Pen Needle (B-D UF III MINI PEN NEEDLES) 31G X 5 MM MISC, Once a day with insulin, Disp: 50 each, Rfl: 3 .  Melatonin 5 MG TABS, Take 5 mg by mouth at bedtime. , Disp: , Rfl:  .  metoprolol tartrate (LOPRESSOR) 50 MG tablet, Take 1 tablet (50 mg total) by mouth 2 (  two) times daily., Disp: 60 tablet, Rfl: 0 .  nitroGLYCERIN (NITROSTAT) 0.4 MG SL tablet, Place 1 tablet (0.4 mg total) under the tongue every 5 (five) minutes as needed for chest pain., Disp: 30 tablet, Rfl: 0 .  oxyCODONE-acetaminophen (PERCOCET) 7.5-325 MG tablet, Take 1 tablet by mouth 3 (three) times daily as needed for severe pain., Disp: 90 tablet, Rfl: 0 .  pregabalin (LYRICA) 50 MG capsule, Take 1 capsule (50 mg total) by mouth 3 (three) times daily., Disp: 90 capsule, Rfl: 1 .  ranitidine (ZANTAC) 150 MG capsule, Take 150 mg by mouth at bedtime. , Disp: , Rfl:  .  ticagrelor (BRILINTA) 90 MG TABS tablet, Take 1 tablet (90 mg total) by mouth 2 (two) times daily., Disp: 60 tablet, Rfl: 0 .  tiZANidine (ZANAFLEX) 4 MG tablet, Take 1 tablet (4 mg total) by mouth every 8 (eight) hours as needed for muscle spasms., Disp: 90 tablet, Rfl: 5  Allergies  Allergen Reactions  . Lisinopril Swelling  . Ace Inhibitors Swelling    Review of Systems  Constitutional: Positive for malaise/fatigue (improving daily ). Negative for chills and fever.  HENT: Positive for nosebleeds (since starting brillenta, instructed to avoid afrin). Negative for congestion (states normally congested uses afrin  daily despite being informed not to- reinforced reasosn to avoid), ear pain, sinus pain and sore throat.   Respiratory: Positive for shortness of breath (exertional improving daily). Negative for cough.   Cardiovascular: Negative for chest pain, palpitations and leg swelling.  Gastrointestinal: Positive for diarrhea. Negative for abdominal pain, nausea and vomiting.  Musculoskeletal: Positive for back pain (chronic, appears to be improved) and neck pain (chronic ). Negative for falls.  Skin: Negative for rash.  Neurological: Negative for dizziness, tingling and headaches.  Psychiatric/Behavioral: Negative for depression. The patient is not nervous/anxious.     No other specific complaints in a complete review of systems (except as listed in HPI above).  Objective  Vitals:   08/30/18 1052  BP: 100/80  Pulse: 85  Resp: 16  Temp: 97.9 F (36.6 C)  TempSrc: Oral  SpO2: 98%  Weight: 220 lb 4.8 oz (99.9 kg)  Height: '5\' 11"'  (1.803 m)    Body mass index is 30.73 kg/m.  Nursing Note and Vital Signs reviewed.  Physical Exam  Constitutional: He is oriented to person, place, and time. He appears well-developed and well-nourished.  HENT:  Head: Normocephalic and atraumatic.  Nose: Nose normal.  Mouth/Throat: No oropharyngeal exudate.  Bilaterally impacted ears   Eyes: Pupils are equal, round, and reactive to light. Conjunctivae are normal.  Neck: Normal range of motion. Neck supple. Carotid bruit is not present.  Cardiovascular: Normal rate, regular rhythm, normal heart sounds and intact distal pulses.  No carotid bruits   Pulmonary/Chest: Effort normal and breath sounds normal.  Abdominal: Soft. Bowel sounds are normal. There is no tenderness. There is no CVA tenderness.  Musculoskeletal: Normal range of motion. He exhibits no edema or deformity.  Neurological: He is alert and oriented to person, place, and time. He has normal strength. He exhibits normal muscle tone. Coordination  and gait normal. GCS eye subscore is 4. GCS verbal subscore is 5. GCS motor subscore is 6.  Skin: Skin is warm, dry and intact.  Psychiatric: He has a normal mood and affect. His speech is normal and behavior is normal. Judgment and thought content normal.  Vitals reviewed.    No results found for this or any previous visit (from the past 48  hour(s)).  Assessment & Plan  1. Hospital discharge follow-up Medications reconciled  - COMPLETE METABOLIC PANEL WITH GFR  2. NSTEMI (non-ST elevated myocardial infarction) (HCC) No chest pain since discharge, follow-up with cards tomorrow.   3. Panic attack Possibly one episode in hospital- states none previously or since.   4. Calcification of abdominal aorta (HCC) Continue anticoagulation, BP control, and lipid management  - Lipid Profile  5. Essential hypertension Stable today, continue current therapies, weight loss, diet changes - COMPLETE METABOLIC PANEL WITH GFR  6. Hyperlipidemia LDL goal <70 continue current therapies, weight loss, diet changes - Lipid Profile - COMPLETE METABOLIC PANEL WITH GFR  7. Elevated liver enzymes Fatty liver disease per patient, will follow.  - COMPLETE METABOLIC PANEL WITH GFR    -Red flags and when to present for emergency care or RTC including chest pain, shortness of breath, new/worsening/un-resolving symptoms,  reviewed with patient at time of visit. Follow up and care instructions discussed and provided in AVS. -Reviewed Health Maintenance: refused immunization

## 2018-08-30 NOTE — Patient Instructions (Addendum)
Food Choices to Help Relieve Diarrhea, Adult When you have diarrhea, the foods you eat and your eating habits are very important. Choosing the right foods and drinks can help:  Relieve diarrhea.  Replace lost fluids and nutrients.  Prevent dehydration.  What general guidelines should I follow? Relieving diarrhea  Choose foods with less than 2 g or .07 oz. of fiber per serving.  Limit fats to less than 8 tsp (38 g or 1.34 oz.) a day.  Avoid the following: ? Foods and beverages sweetened with high-fructose corn syrup, honey, or sugar alcohols such as xylitol, sorbitol, and mannitol. ? Foods that contain a lot of fat or sugar. ? Fried, greasy, or spicy foods. ? High-fiber grains, breads, and cereals. ? Raw fruits and vegetables.  Eat foods that are rich in probiotics. These foods include dairy products such as yogurt and fermented milk products. They help increase healthy bacteria in the stomach and intestines (gastrointestinal tract, or GI tract).  If you have lactose intolerance, avoid dairy products. These may make your diarrhea worse.  Take medicine to help stop diarrhea (antidiarrheal medicine) only as told by your health care provider. Replacing nutrients  Eat small meals or snacks every 3-4 hours.  Eat bland foods, such as white rice, toast, or baked potato, until your diarrhea starts to get better. Gradually reintroduce nutrient-rich foods as tolerated or as told by your health care provider. This includes: ? Well-cooked protein foods. ? Peeled, seeded, and soft-cooked fruits and vegetables. ? Low-fat dairy products.  Take vitamin and mineral supplements as told by your health care provider. Preventing dehydration   Start by sipping water or a special solution to prevent dehydration (oral rehydration solution, ORS). Urine that is clear or pale yellow means that you are getting enough fluid.  Try to drink at least 8-10 cups of fluid each day to help replace lost  fluids.  You may add other liquids in addition to water, such as clear juice or decaffeinated sports drinks, as tolerated or as told by your health care provider.  Avoid drinks with caffeine, such as coffee, tea, or soft drinks.  Avoid alcohol. What foods are recommended? The items listed may not be a complete list. Talk with your health care provider about what dietary choices are best for you. Grains White rice. White, French, or pita breads (fresh or toasted), including plain rolls, buns, or bagels. White pasta. Saltine, soda, or graham crackers. Pretzels. Low-fiber cereal. Cooked cereals made with water (such as cornmeal, farina, or cream cereals). Plain muffins. Matzo. Melba toast. Zwieback. Vegetables Potatoes (without the skin). Most well-cooked and canned vegetables without skins or seeds. Tender lettuce. Fruits Apple sauce. Fruits canned in juice. Cooked apricots, cherries, grapefruit, peaches, pears, or plums. Fresh bananas and cantaloupe. Meats and other protein foods Baked or boiled chicken. Eggs. Tofu. Fish. Seafood. Smooth nut butters. Ground or well-cooked tender beef, ham, veal, lamb, pork, or poultry. Dairy Plain yogurt, kefir, and unsweetened liquid yogurt. Lactose-free milk, buttermilk, skim milk, or soy milk. Low-fat or nonfat hard cheese. Beverages Water. Low-calorie sports drinks. Fruit juices without pulp. Strained tomato and vegetable juices. Decaffeinated teas. Sugar-free beverages not sweetened with sugar alcohols. Oral rehydration solutions, if approved by your health care provider. Seasoning and other foods Bouillon, broth, or soups made from recommended foods. What foods are not recommended? The items listed may not be a complete list. Talk with your health care provider about what dietary choices are best for you. Grains Whole grain, whole wheat,   bran, or rye breads, rolls, pastas, and crackers. Wild or brown rice. Whole grain or bran cereals. Barley. Oats and  oatmeal. Corn tortillas or taco shells. Granola. Popcorn. Vegetables Raw vegetables. Fried vegetables. Cabbage, broccoli, Brussels sprouts, artichokes, baked beans, beet greens, corn, kale, legumes, peas, sweet potatoes, and yams. Potato skins. Cooked spinach and cabbage. Fruits Dried fruit, including raisins and dates. Raw fruits. Stewed or dried prunes. Canned fruits with syrup. Meat and other protein foods Fried or fatty meats. Deli meats. Chunky nut butters. Nuts and seeds. Beans and lentils. Berniece Salines. Hot dogs. Sausage. Dairy High-fat cheeses. Whole milk, chocolate milk, and beverages made with milk, such as milk shakes. Half-and-half. Cream. sour cream. Ice cream. Beverages Caffeinated beverages (such as coffee, tea, soda, or energy drinks). Alcoholic beverages. Fruit juices with pulp. Prune juice. Soft drinks sweetened with high-fructose corn syrup or sugar alcohols. High-calorie sports drinks. Fats and oils Butter. Cream sauces. Margarine. Salad oils. Plain salad dressings. Olives. Avocados. Mayonnaise. Sweets and desserts Sweet rolls, doughnuts, and sweet breads. Sugar-free desserts sweetened with sugar alcohols such as xylitol and sorbitol. Seasoning and other foods Honey. Hot sauce. Chili powder. Gravy. Cream-based or milk-based soups. Pancakes and waffles. Summary  When you have diarrhea, the foods you eat and your eating habits are very important.  Make sure you get at least 8-10 cups of fluid each day, or enough to keep your urine clear or pale yellow.  Eat bland foods and gradually reintroduce healthy, nutrient-rich foods as tolerated, or as told by your health care provider.  Avoid high-fiber, fried, greasy, or spicy foods. This information is not intended to replace advice given to you by your health care provider. Make sure you discuss any questions you have with your health care provider. Document Released: 03/03/2004 Document Revised: 12/09/2016 Document Reviewed:  12/09/2016 Elsevier Interactive Patient Education  2018 [employer].  Chartered certified accountant Patient Education  [employer].

## 2018-09-01 DIAGNOSIS — Z87898 Personal history of other specified conditions: Secondary | ICD-10-CM | POA: Insufficient documentation

## 2018-09-10 ENCOUNTER — Encounter: Payer: Self-pay | Admitting: *Deleted

## 2018-09-10 ENCOUNTER — Encounter: Payer: 59 | Attending: Cardiology | Admitting: *Deleted

## 2018-09-10 VITALS — Ht 71.9 in | Wt 221.4 lb

## 2018-09-10 DIAGNOSIS — I10 Essential (primary) hypertension: Secondary | ICD-10-CM | POA: Diagnosis not present

## 2018-09-10 DIAGNOSIS — Z7982 Long term (current) use of aspirin: Secondary | ICD-10-CM | POA: Diagnosis not present

## 2018-09-10 DIAGNOSIS — Z955 Presence of coronary angioplasty implant and graft: Secondary | ICD-10-CM

## 2018-09-10 DIAGNOSIS — M109 Gout, unspecified: Secondary | ICD-10-CM | POA: Insufficient documentation

## 2018-09-10 DIAGNOSIS — K219 Gastro-esophageal reflux disease without esophagitis: Secondary | ICD-10-CM | POA: Insufficient documentation

## 2018-09-10 DIAGNOSIS — Z794 Long term (current) use of insulin: Secondary | ICD-10-CM | POA: Insufficient documentation

## 2018-09-10 DIAGNOSIS — E119 Type 2 diabetes mellitus without complications: Secondary | ICD-10-CM | POA: Insufficient documentation

## 2018-09-10 DIAGNOSIS — Z79899 Other long term (current) drug therapy: Secondary | ICD-10-CM | POA: Insufficient documentation

## 2018-09-10 DIAGNOSIS — Z87891 Personal history of nicotine dependence: Secondary | ICD-10-CM | POA: Diagnosis not present

## 2018-09-10 NOTE — Progress Notes (Signed)
Cardiac Individual Treatment Plan  Patient Details  Name: John Hartman MRN: [id number] Date of Birth: 1971/11/18 Referring Provider:     Cardiac Rehab from 09/10/2018 in [facility] Cardiac and Pulmonary Rehab  Referring Provider  Isaias Cowman MD      Initial Encounter Date:    Cardiac Rehab from 09/10/2018 in [facility] Cardiac and Pulmonary Rehab  Date  09/10/18      Visit Diagnosis: Status post coronary artery stent placement  Patient's Home Medications on Admission:  Current Outpatient Medications:  .  ACCU-CHEK SOFTCLIX LANCETS lancets, USE AS DIRECTED, Disp: 100 each, Rfl: 0 .  allopurinol (ZYLOPRIM) 100 MG tablet, Take 100 mg by mouth every morning. , Disp: , Rfl:  .  aspirin EC 81 MG tablet, Take 81 mg by mouth daily., Disp: , Rfl:  .  atorvastatin (LIPITOR) 80 MG tablet, Take 1 tablet (80 mg total) by mouth daily at 6 PM., Disp: 30 tablet, Rfl: 0 .  [place] alk-PHENObarbital (DONNATAL) 16.2 MG tablet, Take 1 tablet by mouth as needed (pain)., Disp: , Rfl:  .  cetirizine (ZYRTEC) 10 MG tablet, Take 10 mg by mouth at bedtime. , Disp: , Rfl:  .  colchicine 0.6 MG tablet, Take 0.6 mg by mouth daily as needed (gout). , Disp: , Rfl:  .  Dapagliflozin-metFORMIN HCl ER (XIGDUO XR) 04-999 MG TB24, Take 2 tablets by mouth daily. , Disp: , Rfl:  .  diphenhydrAMINE (BENADRYL) 25 mg capsule, Take 50 mg by mouth at bedtime. , Disp: , Rfl:  .  EPINEPHrine (EPIPEN 2-PAK) 0.3 mg/0.3 mL IJ SOAJ injection, Inject 0.3 mg into the muscle once., Disp: , Rfl:  .  glucose blood (ACCU-CHEK ACTIVE STRIPS) test strip, Use as instructed, Disp: 100 each, Rfl: 12 .  glucose blood (ACCU-CHEK AVIVA PLUS) test strip, Check FSBS three to four times per day, Disp: 100 each, Rfl: 1 .  hydrALAZINE (APRESOLINE) 25 MG tablet, Take 1 tablet (25 mg total) by mouth 2 (two) times daily., Disp: 60 tablet, Rfl: 0 .  hydrochlorothiazide (HYDRODIURIL) 12.5 MG tablet, Take 12.5 mg by mouth daily., Disp: , Rfl:  .  indomethacin  (INDOCIN) 25 MG capsule, Take 25 mg by mouth 2 (two) times daily as needed for mild pain., Disp: , Rfl:  .  Insulin Degludec (TRESIBA FLEXTOUCH) 100 UNIT/ML SOPN, Inject 34 Units into the skin at bedtime. , Disp: 1 pen, Rfl: 0 .  Insulin Pen Needle (B-D UF III MINI PEN NEEDLES) 31G X 5 MM MISC, Once a day with insulin, Disp: 50 each, Rfl: 3 .  Melatonin 5 MG TABS, Take 5 mg by mouth at bedtime. , Disp: , Rfl:  .  metoprolol tartrate (LOPRESSOR) 50 MG tablet, Take 1 tablet (50 mg total) by mouth 2 (two) times daily., Disp: 60 tablet, Rfl: 0 .  nitroGLYCERIN (NITROSTAT) 0.4 MG SL tablet, Place 1 tablet (0.4 mg total) under the tongue every 5 (five) minutes as needed for chest pain., Disp: 30 tablet, Rfl: 0 .  oxyCODONE-acetaminophen (PERCOCET) 7.5-325 MG tablet, Take 1 tablet by mouth 3 (three) times daily as needed for severe pain., Disp: 90 tablet, Rfl: 0 .  pregabalin (LYRICA) 50 MG capsule, Take 1 capsule (50 mg total) by mouth 3 (three) times daily., Disp: 90 capsule, Rfl: 1 .  ranitidine (ZANTAC) 150 MG capsule, Take 150 mg by mouth at bedtime. , Disp: , Rfl:  .  ticagrelor (BRILINTA) 90 MG TABS tablet, Take 1 tablet (90 mg total) by mouth 2 (two)  times daily., Disp: 60 tablet, Rfl: 0 .  tiZANidine (ZANAFLEX) 4 MG tablet, Take 1 tablet (4 mg total) by mouth every 8 (eight) hours as needed for muscle spasms., Disp: 90 tablet, Rfl: 5  Past Medical History: Past Medical History:  Diagnosis Date  . Angioedema   . Bronchitis   . DDD (degenerative disc disease), cervical   . Diabetes mellitus ([place])   . Fatty liver 03/31/2017   [place] April 2018  . GERD (gastroesophageal reflux disease)   . Gout   . HTN (hypertension)   . Hypertension    CONTROLLED ON MEDS  . IBS (irritable bowel syndrome)   . Seasonal allergies   . Spinal stenosis     Tobacco Use: Social History   Tobacco Use  Smoking Status Former Smoker  . Packs/day: 1.00  . Years: 10.00  . Pack years: 10.00  . Types: Cigarettes  .  Last attempt to quit: 04/15/2015  . Years since quitting: 3.4  Smokeless Tobacco Never Used    Labs: Recent Review Flowsheet Data    Labs for ITP Cardiac and Pulmonary Rehab Latest Ref Rng & Units 10/27/2016 12/28/2016 02/10/2017 08/04/2017 08/30/2018   Cholestrol <200 mg/dL 152 135 123 140 147   LDLCALC mg/dL (calc) 105 86 68 88 98   HDL >40 mg/dL 30(L) 30(L) 29(L) 34(L) 26(L)   Trlycerides <150 mg/dL 85 96 132 90 133   Hemoglobin A1c 4.0 - 6.0 % - - - - -       Exercise Target Goals: Exercise Program Goal: Individual exercise prescription set using results from initial 6 min walk test and THRR while considering  patient's activity barriers and safety.   Exercise Prescription Goal: Initial exercise prescription builds to 30-45 minutes a day of aerobic activity, 2-3 days per week.  Home exercise guidelines will be given to patient during program as part of exercise prescription that the participant will acknowledge.  Activity Barriers & Risk Stratification: Activity Barriers & Cardiac Risk Stratification - 09/10/18 1442      Activity Barriers & Cardiac Risk Stratification   Activity Barriers  Back Problems;Neck/Spine Problems;Deconditioning;Muscular Weakness;Other (comment);Arthritis    Comments  chronic back pain, needs back surgery for 4 herniated disc and 2 dislocated, DJD, being seen by pain clinic, mobility limited by pain level on some days    Cardiac Risk Stratification  Moderate       6 Minute Walk: 6 Minute Walk    Row Name 09/10/18 1441         6 Minute Walk   Phase  Initial     Distance  1380 feet     Walk Time  6 minutes     # of Rest Breaks  0     MPH  2.61     METS  4.26     RPE  12     Perceived Dyspnea   2     VO2 Peak  14.93     Symptoms  Yes (comment)     Comments  fatigue     Resting HR  67 bpm     Resting BP  128/54     Resting Oxygen Saturation   97 %     Exercise Oxygen Saturation  during 6 min walk  99 %     Max Ex. HR  91 bpm     Max Ex. BP   138/74     2 Minute Post BP  126/70  Oxygen Initial Assessment:   Oxygen Re-Evaluation:   Oxygen Discharge (Final Oxygen Re-Evaluation):   Initial Exercise Prescription: Initial Exercise Prescription - 09/10/18 1400      Date of Initial Exercise RX and Referring Provider   Date  09/10/18    Referring Provider  Paraschos, Alexander MD      Treadmill   MPH  2.5    Grade  2    Minutes  15    METs  3.6      Recumbant Bike   Level  4    RPM  60    Watts  72    Minutes  15    METs  3      T5 Nustep   Level  4    SPM  80    Minutes  15    METs  3      Prescription Details   Frequency (times per week)  3    Duration  Progress to 45 minutes of aerobic exercise without signs/symptoms of physical distress      Intensity   THRR 40-80% of Max Heartrate  110-153    Ratings of Perceived Exertion  11-13    Perceived Dyspnea  0-4      Progression   Progression  Continue to progress workloads to maintain intensity without signs/symptoms of physical distress.      Resistance Training   Training Prescription  Yes    Weight  4 lbs    Reps  10-15       Perform Capillary Blood Glucose checks as needed.  Exercise Prescription Changes: Exercise Prescription Changes    Row Name 09/10/18 1400             Response to Exercise   Blood Pressure (Admit)  128/54       Blood Pressure (Exercise)  138/74       Blood Pressure (Exit)  126/70       Heart Rate (Admit)  67 bpm       Heart Rate (Exercise)  91 bpm       Heart Rate (Exit)  75 bpm       Oxygen Saturation (Admit)  97 %       Oxygen Saturation (Exercise)  94 %       Rating of Perceived Exertion (Exercise)  12       Perceived Dyspnea (Exercise)  2       Symptoms  fatigue       Comments  walk test results          Exercise Comments:   Exercise Goals and Review: Exercise Goals    Row Name 09/10/18 1445             Exercise Goals   Increase Physical Activity  Yes       Intervention  Provide  advice, education, support and counseling about physical activity/exercise needs.;Develop an individualized exercise prescription for aerobic and resistive training based on initial evaluation findings, risk stratification, comorbidities and participant's personal goals.       Expected Outcomes  Short Term: Attend rehab on a regular basis to increase amount of physical activity.;Long Term: Add in home exercise to make exercise part of routine and to increase amount of physical activity.;Long Term: Exercising regularly at least 3-5 days a week.       Increase Strength and Stamina  Yes       Intervention  Provide advice, education, support and counseling about physical activity/exercise needs.;Develop  an individualized exercise prescription for aerobic and resistive training based on initial evaluation findings, risk stratification, comorbidities and participant's personal goals.       Expected Outcomes  Short Term: Increase workloads from initial exercise prescription for resistance, speed, and METs.;Short Term: Perform resistance training exercises routinely during rehab and add in resistance training at home;Long Term: Improve cardiorespiratory fitness, muscular endurance and strength as measured by increased METs and functional capacity (6MWT)       Able to understand and use rate of perceived exertion (RPE) scale  Yes       Intervention  Provide education and explanation on how to use RPE scale       Expected Outcomes  Short Term: Able to use RPE daily in rehab to express subjective intensity level;Long Term:  Able to use RPE to guide intensity level when exercising independently       Able to understand and use Dyspnea scale  Yes       Intervention  Provide education and explanation on how to use Dyspnea scale       Expected Outcomes  Short Term: Able to use Dyspnea scale daily in rehab to express subjective sense of shortness of breath during exertion;Long Term: Able to use Dyspnea scale to guide  intensity level when exercising independently       Knowledge and understanding of Target Heart Rate Range (THRR)  Yes       Intervention  Provide education and explanation of THRR including how the numbers were predicted and where they are located for reference       Expected Outcomes  Short Term: Able to state/look up THRR;Short Term: Able to use daily as guideline for intensity in rehab;Long Term: Able to use THRR to govern intensity when exercising independently       Able to check pulse independently  Yes       Intervention  Provide education and demonstration on how to check pulse in carotid and radial arteries.;Review the importance of being able to check your own pulse for safety during independent exercise       Expected Outcomes  Short Term: Able to explain why pulse checking is important during independent exercise;Long Term: Able to check pulse independently and accurately       Understanding of Exercise Prescription  Yes       Intervention  Provide education, explanation, and written materials on patient's individual exercise prescription       Expected Outcomes  Short Term: Able to explain program exercise prescription;Long Term: Able to explain home exercise prescription to exercise independently          Exercise Goals Re-Evaluation :   Discharge Exercise Prescription (Final Exercise Prescription Changes): Exercise Prescription Changes - 09/10/18 1400      Response to Exercise   Blood Pressure (Admit)  128/54    Blood Pressure (Exercise)  138/74    Blood Pressure (Exit)  126/70    Heart Rate (Admit)  67 bpm    Heart Rate (Exercise)  91 bpm    Heart Rate (Exit)  75 bpm    Oxygen Saturation (Admit)  97 %    Oxygen Saturation (Exercise)  94 %    Rating of Perceived Exertion (Exercise)  12    Perceived Dyspnea (Exercise)  2    Symptoms  fatigue    Comments  walk test results       Nutrition:  Target Goals: Understanding of nutrition guidelines, daily intake of sodium  <1564m,  cholesterol <261m, calories 30% from fat and 7% or less from saturated fats, daily to have 5 or more servings of fruits and vegetables.  Biometrics: Pre Biometrics - 09/10/18 1446      Pre Biometrics   Height  5' 11.9" (1.826 m)    Weight  221 lb 6.4 oz (100.4 kg)    Waist Circumference  40.5 inches    Hip Circumference  39 inches    Waist to Hip Ratio  1.04 %    BMI (Calculated)  30.12    Single Leg Stand  29.53 seconds        Nutrition Therapy Plan and Nutrition Goals: Nutrition Therapy & Goals - 09/10/18 1428      Intervention Plan   Intervention  Prescribe, educate and counsel regarding individualized specific dietary modifications aiming towards targeted core components such as weight, hypertension, lipid management, diabetes, heart failure and other comorbidities.;Nutrition handout(s) given to patient.    Expected Outcomes  Short Term Goal: Understand basic principles of dietary content, such as calories, fat, sodium, cholesterol and nutrients.;Long Term Goal: Adherence to prescribed nutrition plan.;Short Term Goal: A plan has been developed with personal nutrition goals set during dietitian appointment.       Nutrition Assessments: Nutrition Assessments - 09/10/18 1428      MEDFICTS Scores   Pre Score  30       Nutrition Goals Re-Evaluation:   Nutrition Goals Discharge (Final Nutrition Goals Re-Evaluation):   Psychosocial: Target Goals: Acknowledge presence or absence of significant depression and/or stress, maximize coping skills, provide positive support system. Participant is able to verbalize types and ability to use techniques and skills needed for reducing stress and depression.   Initial Review & Psychosocial Screening: Initial Psych Review & Screening - 09/10/18 1423      Initial Review   Current issues with  Current Stress Concerns    Source of Stress Concerns  Chronic Illness;Unable to participate in former interests or hobbies;Unable to  perform yard/household activities    Comments  SRichardson Landryhas a lot of issues with his back. He was actually in the process of being cleared for back surgery when he had his heart attack. His back hinders him from doing a lot that he would want to do.       Family Dynamics   Good Support System?  Yes   wife     Barriers   Psychosocial barriers to participate in program  The patient should benefit from training in stress management and relaxation.;There are no identifiable barriers or psychosocial needs.      Screening Interventions   Interventions  Encouraged to exercise;Program counselor consult;To provide support and resources with identified psychosocial needs;Provide feedback about the scores to participant    Expected Outcomes  Short Term goal: Utilizing psychosocial counselor, staff and physician to assist with identification of specific Stressors or current issues interfering with healing process. Setting desired goal for each stressor or current issue identified.;Long Term Goal: Stressors or current issues are controlled or eliminated.;Short Term goal: Identification and review with participant of any Quality of Life or Depression concerns found by scoring the questionnaire.;Long Term goal: The participant improves quality of Life and PHQ9 Scores as seen by post scores and/or verbalization of changes       Quality of Life Scores:  Quality of Life - 09/10/18 1425      Quality of Life   Select  Quality of Life      Quality of Life Scores  Health/Function Pre  15.5 %    Socioeconomic Pre  23.36 %    Psych/Spiritual Pre  16.5 %    Family Pre  28.8 %    GLOBAL Pre  19.28 %      Scores of 19 and below usually indicate a poorer quality of life in these areas.  A difference of  2-3 points is a clinically meaningful difference.  A difference of 2-3 points in the total score of the Quality of Life Index has been associated with significant improvement in overall quality of life, self-image,  physical symptoms, and general health in studies assessing change in quality of life.  PHQ-9: Recent Review Flowsheet Data    Depression screen [facility] 2/9 09/10/2018 08/09/2018 07/17/2018 06/12/2018 01/30/2018   Decreased Interest 0 0 0 0 0   Down, Depressed, Hopeless 0 0 0 0 0   PHQ - 2 Score 0 0 0 0 0   Altered sleeping 0 - - - -   Tired, decreased energy 2 - - - -   Change in appetite 1 - - - -   Feeling bad or failure about yourself  1 - - - -   Trouble concentrating 0 - - - -   Moving slowly or fidgety/restless 0 - - - -   Suicidal thoughts 0 - - - -   PHQ-9 Score 4 - - - -   Difficult doing work/chores Very difficult - - - -     Interpretation of Total Score  Total Score Depression Severity:  1-4 = Minimal depression, 5-9 = Mild depression, 10-14 = Moderate depression, 15-19 = Moderately severe depression, 20-27 = Severe depression   Psychosocial Evaluation and Intervention:   Psychosocial Re-Evaluation:   Psychosocial Discharge (Final Psychosocial Re-Evaluation):   Vocational Rehabilitation: Provide vocational rehab assistance to qualifying candidates.   Vocational Rehab Evaluation & Intervention: Vocational Rehab - 09/10/18 1423      Initial Vocational Rehab Evaluation & Intervention   Assessment shows need for Vocational Rehabilitation  No       Education: Education Goals: Education classes will be provided on a variety of topics geared toward better understanding of heart health and risk factor modification. Participant will state understanding/return demonstration of topics presented as noted by education test scores.  Learning Barriers/Preferences: Learning Barriers/Preferences - 09/10/18 1419      Learning Barriers/Preferences   Learning Barriers  None    Learning Preferences  None       Education Topics:  AED/CPR: - Group verbal and written instruction with the use of models to demonstrate the basic use of the AED with the basic ABC's of  resuscitation.   General Nutrition Guidelines/Fats and Fiber: -Group instruction provided by verbal, written material, models and posters to present the general guidelines for heart healthy nutrition. Gives an explanation and review of dietary fats and fiber.   Controlling Sodium/Reading Food Labels: -Group verbal and written material supporting the discussion of sodium use in heart healthy nutrition. Review and explanation with models, verbal and written materials for utilization of the food label.   Exercise Physiology & General Exercise Guidelines: - Group verbal and written instruction with models to review the exercise physiology of the cardiovascular system and associated critical values. Provides general exercise guidelines with specific guidelines to those with heart or lung disease.    Aerobic Exercise & Resistance Training: - Gives group verbal and written instruction on the various components of exercise. Focuses on aerobic and resistive training programs and the  benefits of this training and how to safely progress through these programs..   Flexibility, Balance, Mind/Body Relaxation: Provides group verbal/written instruction on the benefits of flexibility and balance training, including mind/body exercise modes such as yoga, pilates and tai chi.  Demonstration and skill practice provided.   Stress and Anxiety: - Provides group verbal and written instruction about the health risks of elevated stress and causes of high stress.  Discuss the correlation between heart/lung disease and anxiety and treatment options. Review healthy ways to manage with stress and anxiety.   Depression: - Provides group verbal and written instruction on the correlation between heart/lung disease and depressed mood, treatment options, and the stigmas associated with seeking treatment.   Anatomy & Physiology of the Heart: - Group verbal and written instruction and models provide basic cardiac anatomy  and physiology, with the coronary electrical and arterial systems. Review of Valvular disease and Heart Failure   Cardiac Procedures: - Group verbal and written instruction to review commonly prescribed medications for heart disease. Reviews the medication, class of the drug, and side effects. Includes the steps to properly store meds and maintain the prescription regimen. (beta blockers and nitrates)   Cardiac Medications I: - Group verbal and written instruction to review commonly prescribed medications for heart disease. Reviews the medication, class of the drug, and side effects. Includes the steps to properly store meds and maintain the prescription regimen.   Cardiac Medications II: -Group verbal and written instruction to review commonly prescribed medications for heart disease. Reviews the medication, class of the drug, and side effects. (all other drug classes)    Go Sex-Intimacy & Heart Disease, Get SMART - Goal Setting: - Group verbal and written instruction through game format to discuss heart disease and the return to sexual intimacy. Provides group verbal and written material to discuss and apply goal setting through the application of the S.M.A.R.T. Method.   Other Matters of the Heart: - Provides group verbal, written materials and models to describe Stable Angina and Peripheral Artery. Includes description of the disease process and treatment options available to the cardiac patient.   Exercise & Equipment Safety: - Individual verbal instruction and demonstration of equipment use and safety with use of the equipment.   Cardiac Rehab from 09/10/2018 in [facility] Cardiac and Pulmonary Rehab  Date  09/10/18  Educator  [facility]  Instruction Review Code  1- Verbalizes Understanding      Infection Prevention: - Provides verbal and written material to individual with discussion of infection control including proper hand washing and proper equipment cleaning during exercise session.    Cardiac Rehab from 09/10/2018 in [facility] Cardiac and Pulmonary Rehab  Date  09/10/18  Educator  [facility]  Instruction Review Code  1- Verbalizes Understanding      Falls Prevention: - Provides verbal and written material to individual with discussion of falls prevention and safety.   Cardiac Rehab from 09/10/2018 in [facility] Cardiac and Pulmonary Rehab  Date  09/10/18  Educator  [facility]  Instruction Review Code  1- Verbalizes Understanding      Diabetes: - Individual verbal and written instruction to review signs/symptoms of diabetes, desired ranges of glucose level fasting, after meals and with exercise. Acknowledge that pre and post exercise glucose checks will be done for 3 sessions at entry of program.   Cardiac Rehab from 09/10/2018 in [facility] Cardiac and Pulmonary Rehab  Date  09/10/18  Educator  [facility]  Instruction Review Code  1- [employer]  Know Your Numbers and Risk Factors: -Group verbal and written instruction about important numbers in your health.  Discussion of what are risk factors and how they play a role in the disease process.  Review of Cholesterol, Blood Pressure, Diabetes, and BMI and the role they play in your overall health.   Sleep Hygiene: -Provides group verbal and written instruction about how sleep can affect your health.  Define sleep hygiene, discuss sleep cycles and impact of sleep habits. Review good sleep hygiene tips.    Other: -Provides group and verbal instruction on various topics (see comments)   Knowledge Questionnaire Score: Knowledge Questionnaire Score - 09/10/18 1419      Knowledge Questionnaire Score   Pre Score  22/26   correct answers reviewed with [name], focus on nutrition, exercise, MI      Core Components/Risk Factors/Patient Goals at Admission: Personal Goals and Risk Factors at Admission - 09/10/18 1411      Core Components/Risk Factors/Patient Goals on Admission    Weight Management  Yes;Weight Loss    Intervention  Weight  Management: Develop a combined nutrition and exercise program designed to reach desired caloric intake, while maintaining appropriate intake of nutrient and fiber, sodium and fats, and appropriate energy expenditure required for the weight goal.;Weight Management: Provide education and appropriate resources to help participant work on and attain dietary goals.;Weight Management/Obesity: Establish reasonable short term and long term weight goals.    Admit Weight  221 lb 6.4 oz (100.4 kg)    Goal Weight: Short Term  217 lb (98.4 kg)    Goal Weight: Long Term  200 lb (90.7 kg)    Expected Outcomes  Short Term: Continue to assess and modify interventions until short term weight is achieved;Long Term: Adherence to nutrition and physical activity/exercise program aimed toward attainment of established weight goal;Weight Loss: Understanding of general recommendations for a balanced deficit meal plan, which promotes 1-2 lb weight loss per week and includes a negative energy balance of [phone] kcal/d;Understanding recommendations for meals to include 15-35% energy as protein, 25-35% energy from fat, 35-60% energy from carbohydrates, less than 218m of dietary cholesterol, 20-35 gm of total fiber daily;Understanding of distribution of calorie intake throughout the day with the consumption of 4-5 meals/snacks    Diabetes  Yes    Intervention  Provide education about signs/symptoms and action to take for hypo/hyperglycemia.;Provide education about proper nutrition, including hydration, and aerobic/resistive exercise prescription along with prescribed medications to achieve blood glucose in normal ranges: Fasting glucose 65-99 mg/dL    Expected Outcomes  Short Term: Participant verbalizes understanding of the signs/symptoms and immediate care of hyper/hypoglycemia, proper foot care and importance of medication, aerobic/resistive exercise and nutrition plan for blood glucose control.;Long Term: Attainment of HbA1C < 7%.     Hypertension  Yes    Intervention  Provide education on lifestyle modifcations including regular physical activity/exercise, weight management, moderate sodium restriction and increased consumption of fresh fruit, vegetables, and low fat dairy, alcohol moderation, and smoking cessation.;Monitor prescription use compliance.    Expected Outcomes  Short Term: Continued assessment and intervention until BP is < 140/931mHG in hypertensive participants. < 130/804mG in hypertensive participants with diabetes, heart failure or chronic kidney disease.;Long Term: Maintenance of blood pressure at goal levels.       Core Components/Risk Factors/Patient Goals Review:    Core Components/Risk Factors/Patient Goals at Discharge (Final Review):    ITP Comments: ITP Comments    Row Name 09/10/18 1400  ITP Comments  Med Review completed. Initial ITP created. Diagnosis can be found in [facility] 8/28          Comments: Initial ITP

## 2018-09-10 NOTE — Patient Instructions (Signed)
Patient Instructions  Patient Details  Name: John Hartman MRN: [id number] Date of Birth: 06-05-1971 Referring Provider:  Isaias Cowman, MD  Below are your personal goals for exercise, nutrition, and risk factors. Our goal is to help you stay on track towards obtaining and maintaining these goals. We will be discussing your progress on these goals with you throughout the program.  Initial Exercise Prescription: Initial Exercise Prescription - 09/10/18 1400      Date of Initial Exercise RX and Referring Provider   Date  09/10/18    Referring Provider  Paraschos, Alexander MD      Treadmill   MPH  2.5    Grade  2    Minutes  15    METs  3.6      Recumbant Bike   Level  4    RPM  60    Watts  72    Minutes  15    METs  3      T5 Nustep   Level  4    SPM  80    Minutes  15    METs  3      Prescription Details   Frequency (times per week)  3    Duration  Progress to 45 minutes of aerobic exercise without signs/symptoms of physical distress      Intensity   THRR 40-80% of Max Heartrate  110-153    Ratings of Perceived Exertion  11-13    Perceived Dyspnea  0-4      Progression   Progression  Continue to progress workloads to maintain intensity without signs/symptoms of physical distress.      Resistance Training   Training Prescription  Yes    Weight  4 lbs    Reps  10-15       Exercise Goals: Frequency: Be able to perform aerobic exercise two to three times per week in program working toward 2-5 days per week of home exercise.  Intensity: Work with a perceived exertion of 11 (fairly light) - 15 (hard) while following your exercise prescription.  We will make changes to your prescription with you as you progress through the program.   Duration: Be able to do 30 to 45 minutes of continuous aerobic exercise in addition to a 5 minute warm-up and a 5 minute cool-down routine.   Nutrition Goals: Your personal nutrition goals will be established when you do your  nutrition analysis with the dietician.  The following are general nutrition guidelines to follow: Cholesterol < 200mg /day Sodium < 1500mg /day Fiber: Men under 50 yrs - 38 grams per day  Personal Goals: Personal Goals and Risk Factors at Admission - 09/10/18 1411      Core Components/Risk Factors/Patient Goals on Admission    Weight Management  Yes;Weight Loss    Intervention  Weight Management: Develop a combined nutrition and exercise program designed to reach desired caloric intake, while maintaining appropriate intake of nutrient and fiber, sodium and fats, and appropriate energy expenditure required for the weight goal.;Weight Management: Provide education and appropriate resources to help participant work on and attain dietary goals.;Weight Management/Obesity: Establish reasonable short term and long term weight goals.    Admit Weight  221 lb 6.4 oz (100.4 kg)    Goal Weight: Short Term  217 lb (98.4 kg)    Goal Weight: Long Term  200 lb (90.7 kg)    Expected Outcomes  Short Term: Continue to assess and modify interventions until short term weight is achieved;Long Term:  Adherence to nutrition and physical activity/exercise program aimed toward attainment of established weight goal;Weight Loss: Understanding of general recommendations for a balanced deficit meal plan, which promotes 1-2 lb weight loss per week and includes a negative energy balance of [phone] kcal/d;Understanding recommendations for meals to include 15-35% energy as protein, 25-35% energy from fat, 35-60% energy from carbohydrates, less than 200mg  of dietary cholesterol, 20-35 gm of total fiber daily;Understanding of distribution of calorie intake throughout the day with the consumption of 4-5 meals/snacks    Diabetes  Yes    Intervention  Provide education about signs/symptoms and action to take for hypo/hyperglycemia.;Provide education about proper nutrition, including hydration, and aerobic/resistive exercise prescription  along with prescribed medications to achieve blood glucose in normal ranges: Fasting glucose 65-99 mg/dL    Expected Outcomes  Short Term: Participant verbalizes understanding of the signs/symptoms and immediate care of hyper/hypoglycemia, proper foot care and importance of medication, aerobic/resistive exercise and nutrition plan for blood glucose control.;Long Term: Attainment of HbA1C < 7%.    Hypertension  Yes    Intervention  Provide education on lifestyle modifcations including regular physical activity/exercise, weight management, moderate sodium restriction and increased consumption of fresh fruit, vegetables, and low fat dairy, alcohol moderation, and smoking cessation.;Monitor prescription use compliance.    Expected Outcomes  Short Term: Continued assessment and intervention until BP is < 140/75mm HG in hypertensive participants. < 130/63mm HG in hypertensive participants with diabetes, heart failure or chronic kidney disease.;Long Term: Maintenance of blood pressure at goal levels.       Tobacco Use Initial Evaluation: Social History   Tobacco Use  Smoking Status Former Smoker  . Packs/day: 1.00  . Years: 10.00  . Pack years: 10.00  . Types: Cigarettes  . Last attempt to quit: 04/15/2015  . Years since quitting: 3.4  Smokeless Tobacco Never Used    Exercise Goals and Review: Exercise Goals    Row Name 09/10/18 1445             Exercise Goals   Increase Physical Activity  Yes       Intervention  Provide advice, education, support and counseling about physical activity/exercise needs.;Develop an individualized exercise prescription for aerobic and resistive training based on initial evaluation findings, risk stratification, comorbidities and participant's personal goals.       Expected Outcomes  Short Term: Attend rehab on a regular basis to increase amount of physical activity.;Long Term: Add in home exercise to make exercise part of routine and to increase amount of  physical activity.;Long Term: Exercising regularly at least 3-5 days a week.       Increase Strength and Stamina  Yes       Intervention  Provide advice, education, support and counseling about physical activity/exercise needs.;Develop an individualized exercise prescription for aerobic and resistive training based on initial evaluation findings, risk stratification, comorbidities and participant's personal goals.       Expected Outcomes  Short Term: Increase workloads from initial exercise prescription for resistance, speed, and METs.;Short Term: Perform resistance training exercises routinely during rehab and add in resistance training at home;Long Term: Improve cardiorespiratory fitness, muscular endurance and strength as measured by increased METs and functional capacity (6MWT)       Able to understand and use rate of perceived exertion (RPE) scale  Yes       Intervention  Provide education and explanation on how to use RPE scale       Expected Outcomes  Short Term: Able to use RPE  daily in rehab to express subjective intensity level;Long Term:  Able to use RPE to guide intensity level when exercising independently       Able to understand and use Dyspnea scale  Yes       Intervention  Provide education and explanation on how to use Dyspnea scale       Expected Outcomes  Short Term: Able to use Dyspnea scale daily in rehab to express subjective sense of shortness of breath during exertion;Long Term: Able to use Dyspnea scale to guide intensity level when exercising independently       Knowledge and understanding of Target Heart Rate Range (THRR)  Yes       Intervention  Provide education and explanation of THRR including how the numbers were predicted and where they are located for reference       Expected Outcomes  Short Term: Able to state/look up THRR;Short Term: Able to use daily as guideline for intensity in rehab;Long Term: Able to use THRR to govern intensity when exercising independently        Able to check pulse independently  Yes       Intervention  Provide education and demonstration on how to check pulse in carotid and radial arteries.;Review the importance of being able to check your own pulse for safety during independent exercise       Expected Outcomes  Short Term: Able to explain why pulse checking is important during independent exercise;Long Term: Able to check pulse independently and accurately       Understanding of Exercise Prescription  Yes       Intervention  Provide education, explanation, and written materials on patient's individual exercise prescription       Expected Outcomes  Short Term: Able to explain program exercise prescription;Long Term: Able to explain home exercise prescription to exercise independently          Copy of goals given to participant.

## 2018-09-11 ENCOUNTER — Encounter: Payer: 59 | Admitting: Student in an Organized Health Care Education/Training Program

## 2018-09-11 ENCOUNTER — Ambulatory Visit: Payer: 59 | Admitting: Student in an Organized Health Care Education/Training Program

## 2018-09-12 DIAGNOSIS — Z955 Presence of coronary angioplasty implant and graft: Secondary | ICD-10-CM | POA: Diagnosis not present

## 2018-09-12 LAB — GLUCOSE, CAPILLARY
Glucose-Capillary: 186 mg/dL — ABNORMAL HIGH (ref 70–99)
Glucose-Capillary: 129 mg/dL — ABNORMAL HIGH (ref 70–99)

## 2018-09-12 NOTE — Progress Notes (Signed)
Daily Session Note  Patient Details  Name: John Hartman MRN: [id number] Date of Birth: May 12, 1971 Referring Provider:     Cardiac Rehab from 09/10/2018 in [facility] Cardiac and Pulmonary Rehab  Referring Provider  Isaias Cowman MD      Encounter Date: 09/12/2018  Check In: Session Check In - 09/12/18 0807      Check-In   Supervising physician immediately available to respond to emergencies  See telemetry face sheet for immediately available ER MD    Location  [facility]-Cardiac & Pulmonary Rehab    Staff Present  Alberteen Sam, MA, RCEP, CCRP, Exercise Physiologist;Kritika Stukes RCP,RRT,BSRT;Carroll Enterkin, Therapist, sports, BSN    Medication changes reported      No    Fall or balance concerns reported     No    Warm-up and Cool-down  Performed on first and last piece of equipment    Resistance Training Performed  Yes    VAD Patient?  No      Pain Assessment   Currently in Pain?  No/denies          Social History   Tobacco Use  Smoking Status Former Smoker  . Packs/day: 1.00  . Years: 10.00  . Pack years: 10.00  . Types: Cigarettes  . Last attempt to quit: 04/15/2015  . Years since quitting: 3.4  Smokeless Tobacco Never Used    Goals Met:  Exercise tolerated well Personal goals reviewed No report of cardiac concerns or symptoms Strength training completed today  Goals Unmet:  Not Applicable  Comments: First full day of exercise!  Patient was oriented to gym and equipment including functions, settings, policies, and procedures.  Patient's individual exercise prescription and treatment plan were reviewed.  All starting workloads were established based on the results of the 6 minute walk test done at initial orientation visit.  The plan for exercise progression was also introduced and progression will be customized based on patient's performance and goals.    Dr. Emily Filbert is Medical Director for [place] and [name].

## 2018-09-14 ENCOUNTER — Encounter: Payer: 59 | Admitting: *Deleted

## 2018-09-14 DIAGNOSIS — Z955 Presence of coronary angioplasty implant and graft: Secondary | ICD-10-CM

## 2018-09-14 NOTE — Progress Notes (Signed)
Daily Session Note  Patient Details  Name: John Hartman MRN: [id number] Date of Birth: 1971-03-08 Referring Provider:     Cardiac Rehab from 09/10/2018 in [facility] Cardiac and Pulmonary Rehab  Referring Provider  Isaias Cowman MD      Encounter Date: 09/14/2018  Check In: Session Check In - 09/14/18 0746      Check-In   Supervising physician immediately available to respond to emergencies  See telemetry face sheet for immediately available ER MD    Location  [facility]-Cardiac & Pulmonary Rehab    Staff Present  Renita Papa, RN BSN;Benjerman Molinelli Luan Pulling, MA, RCEP, CCRP, Exercise Physiologist;Amanda Oletta Darter, [place], ACSM CEP, Exercise Physiologist    Medication changes reported      No    Fall or balance concerns reported     No    Warm-up and Cool-down  Performed on first and last piece of equipment    Resistance Training Performed  Yes    VAD Patient?  No    PAD/SET Patient?  No      Pain Assessment   Currently in Pain?  No/denies          Social History   Tobacco Use  Smoking Status Former Smoker  . Packs/day: 1.00  . Years: 10.00  . Pack years: 10.00  . Types: Cigarettes  . Last attempt to quit: 04/15/2015  . Years since quitting: 3.4  Smokeless Tobacco Never Used    Goals Met:  Exercise tolerated well No report of cardiac concerns or symptoms Strength training completed today  Goals Unmet:  Not Applicable  Comments: Pt able to follow exercise prescription today without complaint.  Will continue to monitor for progression.    Dr. Emily Filbert is Medical Director for [place] and [name].

## 2018-09-17 ENCOUNTER — Encounter: Payer: 59 | Admitting: *Deleted

## 2018-09-17 DIAGNOSIS — Z955 Presence of coronary angioplasty implant and graft: Secondary | ICD-10-CM | POA: Diagnosis not present

## 2018-09-17 LAB — GLUCOSE, CAPILLARY
GLUCOSE-CAPILLARY: 172 mg/dL — AB (ref 70–99)
GLUCOSE-CAPILLARY: 93 mg/dL (ref 70–99)

## 2018-09-17 NOTE — Progress Notes (Signed)
Daily Session Note  Patient Details  Name: John Hartman MRN: [id number] Date of Birth: 1971/01/07 Referring Provider:     Cardiac Rehab from 09/10/2018 in [facility] Cardiac and Pulmonary Rehab  Referring Provider  Isaias Cowman MD      Encounter Date: 09/17/2018  Check In: Session Check In - 09/17/18 0754      Check-In   Supervising physician immediately available to respond to emergencies  See telemetry face sheet for immediately available ER MD    Location  [facility]-Cardiac & Pulmonary Rehab    Staff Present  Alberteen Sam, MA, RCEP, CCRP, Exercise Physiologist;Jean Alejos Amedeo Plenty, BS, ACSM CEP, Exercise Physiologist;Susanne Bice, RN, BSN, CCRP    Medication changes reported      No    Fall or balance concerns reported     No    Tobacco Cessation  No Change    Warm-up and Cool-down  Performed on first and last piece of equipment    Resistance Training Performed  Yes    VAD Patient?  No    PAD/SET Patient?  No      Pain Assessment   Currently in Pain?  No/denies    Multiple Pain Sites  No          Social History   Tobacco Use  Smoking Status Former Smoker  . Packs/day: 1.00  . Years: 10.00  . Pack years: 10.00  . Types: Cigarettes  . Last attempt to quit: 04/15/2015  . Years since quitting: 3.4  Smokeless Tobacco Never Used    Goals Met:  Independence with exercise equipment Exercise tolerated well No report of cardiac concerns or symptoms Strength training completed today  Goals Unmet:  Not Applicable  Comments: Pt able to follow exercise prescription today without complaint.  Will continue to monitor for progression.    Dr. Emily Filbert is Medical Director for [place] and [name].

## 2018-09-18 ENCOUNTER — Other Ambulatory Visit: Payer: Self-pay

## 2018-09-18 ENCOUNTER — Encounter: Payer: Self-pay | Admitting: Student in an Organized Health Care Education/Training Program

## 2018-09-18 ENCOUNTER — Ambulatory Visit
Payer: 59 | Attending: Student in an Organized Health Care Education/Training Program | Admitting: Student in an Organized Health Care Education/Training Program

## 2018-09-18 VITALS — BP 149/113 | HR 73 | Temp 99.1°F | Resp 18 | Ht 71.0 in | Wt 222.0 lb

## 2018-09-18 DIAGNOSIS — R002 Palpitations: Secondary | ICD-10-CM | POA: Diagnosis not present

## 2018-09-18 DIAGNOSIS — Z87891 Personal history of nicotine dependence: Secondary | ICD-10-CM | POA: Insufficient documentation

## 2018-09-18 DIAGNOSIS — M5416 Radiculopathy, lumbar region: Secondary | ICD-10-CM

## 2018-09-18 DIAGNOSIS — K760 Fatty (change of) liver, not elsewhere classified: Secondary | ICD-10-CM | POA: Diagnosis not present

## 2018-09-18 DIAGNOSIS — G8929 Other chronic pain: Secondary | ICD-10-CM

## 2018-09-18 DIAGNOSIS — G894 Chronic pain syndrome: Secondary | ICD-10-CM

## 2018-09-18 DIAGNOSIS — I252 Old myocardial infarction: Secondary | ICD-10-CM | POA: Insufficient documentation

## 2018-09-18 DIAGNOSIS — M109 Gout, unspecified: Secondary | ICD-10-CM | POA: Insufficient documentation

## 2018-09-18 DIAGNOSIS — I2510 Atherosclerotic heart disease of native coronary artery without angina pectoris: Secondary | ICD-10-CM | POA: Diagnosis not present

## 2018-09-18 DIAGNOSIS — K219 Gastro-esophageal reflux disease without esophagitis: Secondary | ICD-10-CM | POA: Diagnosis not present

## 2018-09-18 DIAGNOSIS — M5136 Other intervertebral disc degeneration, lumbar region: Secondary | ICD-10-CM | POA: Diagnosis not present

## 2018-09-18 DIAGNOSIS — K589 Irritable bowel syndrome without diarrhea: Secondary | ICD-10-CM | POA: Insufficient documentation

## 2018-09-18 DIAGNOSIS — I10 Essential (primary) hypertension: Secondary | ICD-10-CM | POA: Insufficient documentation

## 2018-09-18 DIAGNOSIS — Z79899 Other long term (current) drug therapy: Secondary | ICD-10-CM | POA: Diagnosis not present

## 2018-09-18 DIAGNOSIS — Z9889 Other specified postprocedural states: Secondary | ICD-10-CM | POA: Diagnosis not present

## 2018-09-18 DIAGNOSIS — Z803 Family history of malignant neoplasm of breast: Secondary | ICD-10-CM | POA: Diagnosis not present

## 2018-09-18 DIAGNOSIS — M51369 Other intervertebral disc degeneration, lumbar region without mention of lumbar back pain or lower extremity pain: Secondary | ICD-10-CM

## 2018-09-18 DIAGNOSIS — E119 Type 2 diabetes mellitus without complications: Secondary | ICD-10-CM

## 2018-09-18 DIAGNOSIS — M545 Low back pain, unspecified: Secondary | ICD-10-CM

## 2018-09-18 DIAGNOSIS — E785 Hyperlipidemia, unspecified: Secondary | ICD-10-CM | POA: Insufficient documentation

## 2018-09-18 DIAGNOSIS — Z981 Arthrodesis status: Secondary | ICD-10-CM

## 2018-09-18 DIAGNOSIS — Z800 Family history of malignant neoplasm of digestive organs: Secondary | ICD-10-CM | POA: Diagnosis not present

## 2018-09-18 DIAGNOSIS — Z888 Allergy status to other drugs, medicaments and biological substances status: Secondary | ICD-10-CM | POA: Diagnosis not present

## 2018-09-18 DIAGNOSIS — M48062 Spinal stenosis, lumbar region with neurogenic claudication: Secondary | ICD-10-CM | POA: Diagnosis not present

## 2018-09-18 DIAGNOSIS — Z8249 Family history of ischemic heart disease and other diseases of the circulatory system: Secondary | ICD-10-CM | POA: Diagnosis not present

## 2018-09-18 DIAGNOSIS — Z794 Long term (current) use of insulin: Secondary | ICD-10-CM

## 2018-09-18 DIAGNOSIS — Z833 Family history of diabetes mellitus: Secondary | ICD-10-CM | POA: Diagnosis not present

## 2018-09-18 DIAGNOSIS — M47816 Spondylosis without myelopathy or radiculopathy, lumbar region: Secondary | ICD-10-CM

## 2018-09-18 MED ORDER — TIZANIDINE HCL 4 MG PO TABS: 4.0000 mg | ORAL_TABLET | Freq: Three times a day (TID) | ORAL | 5 refills | Status: DC | PRN

## 2018-09-18 MED ORDER — BUPRENORPHINE HCL 150 MCG BU FILM: 150.0000 ug | ORAL_FILM | Freq: Two times a day (BID) | BUCCAL | 1 refills | Status: DC

## 2018-09-18 MED ORDER — PREGABALIN 50 MG PO CAPS: 50.0000 mg | ORAL_CAPSULE | Freq: Three times a day (TID) | ORAL | 2 refills | Status: DC

## 2018-09-18 NOTE — Progress Notes (Signed)
Safety precautions to be maintained throughout the outpatient stay will include: orient to surroundings, keep bed in low position, maintain call bell within reach at all times, provide assistance with transfer out of bed and ambulation.  

## 2018-09-18 NOTE — Patient Instructions (Signed)
Belbuca, Lyrica and zanaflex escribed to [facility] [place].

## 2018-09-18 NOTE — Progress Notes (Signed)
Patient's Name: John Hartman  MRN: [id number]  Referring Provider: Arnetha Courser, MD  DOB: Apr 28, 1971  PCP: Arnetha Courser, MD  DOS: 09/18/2018  Note by: Gillis Santa, MD  Service setting: Ambulatory outpatient  Specialty: Interventional Pain Management  Location: [facility] (AMB) Pain Management Facility    Patient type: Established   Primary Reason(s) for Visit: Encounter for post-procedure evaluation of chronic illness with mild to moderate exacerbation CC: Follow-up  HPI  John Hartman is a 47 y.o. year old, male patient, who comes today for a post-procedure evaluation. He has Essential hypertension; Gout; IBS (irritable bowel syndrome); Family history of malignant neoplasm of gastrointestinal tract; Benign neoplasm of rectosigmoid junction; Palpitations; Decongestant abuse; Ketonuria; Glucosuria; Preventative health care; Chest pain; Diabetes mellitus ([place]); Encounter for medication monitoring; Coronary artery disease; Angioedema; Hyperlipidemia LDL goal <70; Elevated serum glutamic pyruvic transaminase (SGPT) level; Elevated liver enzymes; Serum total bilirubin elevated; Fatty liver; Chronic lower back pain; Degenerative disc disease, lumbar; Arthritis, lumbar spine; Calcification of abdominal aorta ([place]); Elevated hemoglobin ([place]); and NSTEMI (non-ST elevated myocardial infarction) ([place]) on their problem list. His primarily concern today is the Follow-up  Pain Assessment: Location: Lower, Left, Right Back Radiating: Radiates down hips bilateral  Onset: More than a month ago Duration: Chronic pain Quality: Aching, Dull, Discomfort, Constant, Tingling, Shooting Severity: 7 /10 (subjective, self-reported pain score)  Note: Reported level is compatible with observation.                          Effect on ADL: "Limits activies, not has active, any kind of walking agrregavtes the nerves and causes hip pain"  Timing: Constant Modifying factors: Pin medications; heating pad  BP: (!) 149/113  HR:  73  John Hartman comes in today for post-procedure evaluation after the treatment done on 08/22/2018.  Further details on both, my assessment(s), as well as the proposed treatment plan, please see below.  Post-Procedure Assessment  08/22/2018 Procedure: Left L4-L5 ESI Pre-procedure pain score:  7/10 Post-procedure pain score: 0/10         Influential Factors: BMI: 30.96 kg/m Intra-procedural challenges: None observed.         Assessment challenges: None detected.              Reported side-effects: None.        Post-procedural adverse reactions or complications: None reported         Sedation: Please see nurses note. When no sedatives are used, the analgesic levels obtained are directly associated to the effectiveness of the local anesthetics. However, when sedation is provided, the level of analgesia obtained during the initial 1 hour following the intervention, is believed to be the result of a combination of factors. These factors may include, but are not limited to: 1. The effectiveness of the local anesthetics used. 2. The effects of the analgesic(s) and/or anxiolytic(s) used. 3. The degree of discomfort experienced by the patient at the time of the procedure. 4. The patients ability and reliability in recalling and recording the events. 5. The presence and influence of possible secondary gains and/or psychosocial factors. Reported result: Relief experienced during the 1st hour after the procedure: 100 % (Ultra-Short Term Relief)            Interpretative annotation: Clinically appropriate result. Analgesia during this period is likely to be Local Anesthetic and/or IV Sedative (Analgesic/Anxiolytic) related.          Effects of local anesthetic: The analgesic effects attained  during this period are directly associated to the localized infiltration of local anesthetics and therefore cary significant diagnostic value as to the etiological location, or anatomical origin, of the pain. Expected  duration of relief is directly dependent on the pharmacodynamics of the local anesthetic used. Long-acting (4-6 hours) anesthetics used.  Reported result: Relief during the next 4 to 6 hour after the procedure: 100 % (Short-Term Relief)            Interpretative annotation: Clinically appropriate result. Analgesia during this period is likely to be Local Anesthetic-related.          Long-term benefit: Defined as the period of time past the expected duration of local anesthetics (1 hour for short-acting and 4-6 hours for long-acting). With the possible exception of prolonged sympathetic blockade from the local anesthetics, benefits during this period are typically attributed to, or associated with, other factors such as analgesic sensory neuropraxia, antiinflammatory effects, or beneficial biochemical changes provided by agents other than the local anesthetics.  Reported result: Extended relief following procedure: 50 % (Long-Term Relief)            Interpretative annotation: Clinically possible results. Good relief. No permanent benefit expected. Inflammation plays a part in the etiology to the pain.          Current benefits: Defined as reported results that persistent at this point in time.   Analgesia: 50 %            Function: Somewhat improved ROM: Somewhat improved Interpretative annotation: Partial relief. No permanent benefit expected. Effective diagnostic intervention.          Interpretation: Results would suggest a successful diagnostic and therapeutic intervention.                  Plan:  Please see "Plan of Care" for details.                Laboratory Chemistry  Inflammation Markers (CRP: Acute Phase) (ESR: Chronic Phase) No results found for: CRP, ESRSEDRATE, LATICACIDVEN                       Rheumatology Markers Lab Results  Component Value Date   RF <14 08/04/2017   ANA Negative 09/21/2017   LABURIC 6.4 08/04/2017                        Renal Function Markers Lab  Results  Component Value Date   BUN 18 08/30/2018   CREATININE 1.08 04/88/8916   BCR NOT APPLICABLE 94/50/3888   GFRAA 96 08/30/2018   GFRNONAA 82 08/30/2018                             Hepatic Function Markers Lab Results  Component Value Date   AST 85 (H) 08/30/2018   ALT 103 (H) 08/30/2018   ALBUMIN 4.9 08/22/2018   ALKPHOS 66 08/22/2018                        Electrolytes Lab Results  Component Value Date   NA 138 08/30/2018   K 4.0 08/30/2018   CL 99 08/30/2018   CALCIUM 9.8 08/30/2018   MG 2.0 08/27/2016   PHOS 1.9 (L) 08/27/2016                        Neuropathy Markers Lab Results  Component Value Date   HGBA1C  07/08/2016    UNABLE TO REPORT A1C DUE TO UNKNOWN INTERFERING FACTOR   HIV Non Reactive 08/23/2018                        CNS Tests No results found for: COLORCSF, APPEARCSF, RBCCOUNTCSF, WBCCSF, POLYSCSF, LYMPHSCSF, EOSCSF, PROTEINCSF, GLUCCSF, JCVIRUS, CSFOLI, IGGCSF                      Bone Pathology Markers Lab Results  Component Value Date   VD25OH 30 08/04/2017                         Coagulation Parameters Lab Results  Component Value Date   INR 1.11 08/22/2018   LABPROT 14.2 08/22/2018   APTT 116 (H) 08/22/2018   PLT 151 08/24/2018                        Cardiovascular Markers Lab Results  Component Value Date   TROPONINI 16.49 ([place]) 08/23/2018   HGB 16.5 08/24/2018   HCT 48.5 08/24/2018                         CA Markers No results found for: CEA, CA125, LABCA2                      Note: Lab results reviewed.  Recent Diagnostic Imaging Results  ECHOCARDIOGRAM COMPLETE                 *[place], [place] [address]                            [phone]  ------------------------------------------------------------------- Transthoracic Echocardiography  Patient:    Adrin, Julian MR #:       [id number] Study Date:  08/24/2018 Gender:     M Age:        7 Height:     180.3 cm Weight:     104.3 kg BSA:        2.32 m^2 Pt. Status: Room:   PERFORMING   [facility]  SONOGRAPHER  Charmayne Sheer, RDCS  ATTENDING    Hortencia Conradi  REFERRING    Doristine Mango L  cc:  ------------------------------------------------------------------- LV EF: 35% -   40%  ------------------------------------------------------------------- Indications:      CAD Native Vessel.  ------------------------------------------------------------------- History:   Risk factors:  Hypertension. Diabetes mellitus.  ------------------------------------------------------------------- Study Conclusions  - Left ventricle: Wall thickness was increased in a pattern of   moderate LVH. Systolic function was moderately reduced. The   estimated ejection fraction was in the range of 35% to 40%.   Hypokinesis of the inferior myocardium. Hypokinesis of the   inferolateral myocardium. - Aortic valve: Valve area (VTI): 3.35 cm^2. Valve area (Vmax):   3.05 cm^2. Valve area (Vmean): 3.29 cm^2. - Mitral valve: Valve area by continuity equation (using LVOT   flow): 3.53 cm^2.  ------------------------------------------------------------------- Study data:   Study  status:  Routine.  Procedure:  The patient reported no pain pre or post test. Transthoracic echocardiography. Image quality was suboptimal. The study was technically difficult, as a result of poor sound wave transmission and body habitus.    Transthoracic echocardiography.  M-mode, complete 2D, spectral Doppler, and color Doppler.  Birthdate:  Patient birthdate: 08-12-1971.  Age:  Patient is 47 yr old.  Sex:  Gender: male. BMI: 32.1 kg/m^2.  Blood pressure:     146/109  Patient status: Inpatient.  Study date:  Study date: 08/24/2018. Study time: 02:43 PM.  Location:   Bedside.  -------------------------------------------------------------------  ------------------------------------------------------------------- Left ventricle:   Wall thickness was increased in a pattern of moderate LVH.   Systolic function was moderately reduced. The estimated ejection fraction was in the range of 35% to 40%. Regional wall motion abnormalities:   Hypokinesis of the inferior myocardium.  Hypokinesis of the inferolateral myocardium.  ------------------------------------------------------------------- Aortic valve:   Trileaflet.  Doppler:   There was no stenosis. VTI ratio of LVOT to aortic valve: 0.88. Valve area (VTI): 3.35 cm^2. Indexed valve area (VTI): 1.45 cm^2/m^2. Peak velocity ratio of LVOT to aortic valve: 0.8. Valve area (Vmax): 3.05 cm^2. Indexed valve area (Vmax): 1.32 cm^2/m^2. Mean velocity ratio of LVOT to aortic valve: 0.87. Valve area (Vmean): 3.29 cm^2. Indexed valve area (Vmean): 1.42 cm^2/m^2.    Mean gradient (S): 2 mm Hg. Peak gradient (S): 4 mm Hg.  ------------------------------------------------------------------- Mitral valve:  Poorly visualized.  Doppler:  There was trivial regurgitation.    Valve area by pressure half-time: 4.4 cm^2. Indexed valve area by pressure half-time: 1.9 cm^2/m^2. Valve area by continuity equation (using LVOT flow): 3.53 cm^2. Indexed valve area by continuity equation (using LVOT flow): 1.52 cm^2/m^2. Mean gradient (D): 1 mm Hg.  ------------------------------------------------------------------- Left atrium:  The atrium was normal in size.  ------------------------------------------------------------------- Right ventricle:  The cavity size was normal. Wall thickness was normal. Systolic function was normal.  ------------------------------------------------------------------- Tricuspid valve:  Poorly visualized.  Doppler:  There was  trivial regurgitation.  ------------------------------------------------------------------- Pericardium:  Not well visualized.  ------------------------------------------------------------------- Measurements   Left ventricle                            Value          Reference  LV ID, ED, PLAX chordal           (L)     36    mm       43 - 52  LV ID, ES, PLAX chordal                   30.6  mm       23 - 38  LV fx shortening, PLAX chordal    (L)     15    %        >=29  LV PW thickness, ED                       13.5  mm       ---------  IVS/LV PW ratio, ED                       0.92           <=1.3  Stroke volume, 2D  45    ml       ---------  Stroke volume/bsa, 2D                     19    ml/m^2   ---------  LV ejection fraction, 1-p A4C             50    %        ---------  LV end-diastolic volume, 2-p              67    ml       ---------  LV end-systolic volume, 2-p               40    ml       ---------  LV ejection fraction, 2-p                 41    %        ---------  Stroke volume, 2-p                        27    ml       ---------  LV end-diastolic volume/bsa, 2-p          29    ml/m^2   ---------  LV end-systolic volume/bsa, 2-p           17    ml/m^2   ---------  Stroke volume/bsa, 2-p                    11.8  ml/m^2   ---------  LV e&', lateral                            3.81  cm/s     ---------  LV E/e&', lateral                          10.58          ---------  LV e&', medial                             4.03  cm/s     ---------  LV E/e&', medial                           10             ---------  LV e&', average                            3.92  cm/s     ---------  LV E/e&', average                          10.28          ---------    Ventricular septum                        Value          Reference  IVS thickness, ED                         12.4  mm       ---------  LVOT                                      Value          Reference  LVOT  ID, S                                22    mm       ---------  LVOT area                                 3.8   cm^2     ---------  LVOT peak velocity, S                     79.1  cm/s     ---------  LVOT mean velocity, S                     57    cm/s     ---------  LVOT VTI, S                               11.8  cm       ---------    Aortic valve                              Value          Reference  Aortic valve peak velocity, S             98.6  cm/s     ---------  Aortic valve mean velocity, S             65.8  cm/s     ---------  Aortic valve VTI, S                       13.4  cm       ---------  Aortic mean gradient, S                   2     mm Hg    ---------  Aortic peak gradient, S                   4     mm Hg    ---------  VTI ratio, LVOT/AV                        0.88           ---------  Aortic valve area, VTI                    3.35  cm^2     ---------  Aortic valve area/bsa, VTI                1.45  cm^2/m^2 ---------  Velocity ratio, peak, LVOT/AV             0.8            ---------  Aortic valve area, peak velocity          3.05  cm^2     ---------  Aortic valve area/bsa, peak               1.32  cm^2/m^2 ---------  velocity  Velocity ratio, mean, LVOT/AV             0.87           ---------  Aortic valve area, mean velocity          3.29  cm^2     ---------  Aortic valve area/bsa, mean               1.42  cm^2/m^2 ---------  velocity    Aorta                                     Value          Reference  Aortic root ID, ED                        36    mm       ---------    Left atrium                               Value          Reference  LA ID, A-P, ES                            37    mm       ---------  LA ID/bsa, A-P                            1.6   cm/m^2   <=2.2  LA volume, S                              35.3  ml       ---------  LA volume/bsa, S                          15.2  ml/m^2   ---------  LA volume, ES, 1-p A4C                    30.9  ml        ---------  LA volume/bsa, ES, 1-p A4C                13.3  ml/m^2   ---------  LA volume, ES, 1-p A2C                    35.5  ml       ---------  LA volume/bsa, ES, 1-p A2C                15.3  ml/m^2   ---------    Mitral valve                              Value          Reference  Mitral E-wave peak velocity               40.3  cm/s     ---------  Mitral A-wave peak velocity  82.9  cm/s     ---------  Mitral mean velocity, D                   44.3  cm/s     ---------  Mitral deceleration time                  169   ms       150 - 230  Mitral pressure half-time                 50    ms       ---------  Mitral mean gradient, D                   1     mm Hg    ---------  Mitral E/A ratio, peak                    0.5            ---------  Mitral valve area, PHT, DP                4.4   cm^2     ---------  Mitral valve area/bsa, PHT, DP            1.9   cm^2/m^2 ---------  Mitral valve area, LVOT                   3.53  cm^2     ---------  continuity  Mitral valve area/bsa, LVOT               1.52  cm^2/m^2 ---------  continuity  Mitral annulus VTI, D                     12.7  cm       ---------    Right ventricle                           Value          Reference  RV ID, minor axis, ED, A4C base           24    mm       ---------    Pulmonic valve                            Value          Reference  Pulmonic valve peak velocity, S           88.8  cm/s     ---------  Pulmonic acceleration time                55    ms       ---------  Legend: (L)  and  (H)  mark values outside specified reference range.  ------------------------------------------------------------------- Prepared and Electronically Authenticated by  Bartholome Bill, MD 2019-08-30T15:39:06  Complexity Note: Imaging results reviewed. Results shared with Mr. Vences, using Layman's terms.                         Meds   Current Outpatient Medications:  .  ACCU-CHEK SOFTCLIX LANCETS lancets, USE AS  DIRECTED, Disp: 100 each, Rfl: 0 .  allopurinol (ZYLOPRIM) 100 MG tablet, Take 100 mg by mouth every morning. , Disp: , Rfl:  .  aspirin EC  81 MG tablet, Take 81 mg by mouth daily., Disp: , Rfl:  .  atorvastatin (LIPITOR) 80 MG tablet, Take 1 tablet (80 mg total) by mouth daily at 6 PM., Disp: 30 tablet, Rfl: 0 .  [place] alk-PHENObarbital (DONNATAL) 16.2 MG tablet, Take 1 tablet by mouth as needed (pain)., Disp: , Rfl:  .  cetirizine (ZYRTEC) 10 MG tablet, Take 10 mg by mouth at bedtime. , Disp: , Rfl:  .  colchicine 0.6 MG tablet, Take 0.6 mg by mouth daily as needed (gout). , Disp: , Rfl:  .  Dapagliflozin-metFORMIN HCl ER (XIGDUO XR) 04-999 MG TB24, Take 2 tablets by mouth daily. , Disp: , Rfl:  .  diphenhydrAMINE (BENADRYL) 25 mg capsule, Take 50 mg by mouth at bedtime. , Disp: , Rfl:  .  EPINEPHrine (EPIPEN 2-PAK) 0.3 mg/0.3 mL IJ SOAJ injection, Inject 0.3 mg into the muscle once., Disp: , Rfl:  .  glucose blood (ACCU-CHEK ACTIVE STRIPS) test strip, Use as instructed, Disp: 100 each, Rfl: 12 .  glucose blood (ACCU-CHEK AVIVA PLUS) test strip, Check FSBS three to four times per day, Disp: 100 each, Rfl: 1 .  hydrALAZINE (APRESOLINE) 25 MG tablet, Take 1 tablet (25 mg total) by mouth 2 (two) times daily., Disp: 60 tablet, Rfl: 0 .  hydrochlorothiazide (HYDRODIURIL) 12.5 MG tablet, Take 12.5 mg by mouth daily., Disp: , Rfl:  .  indomethacin (INDOCIN) 25 MG capsule, Take 25 mg by mouth 2 (two) times daily as needed for mild pain., Disp: , Rfl:  .  Insulin Degludec (TRESIBA FLEXTOUCH) 100 UNIT/ML SOPN, Inject 34 Units into the skin at bedtime. , Disp: 1 pen, Rfl: 0 .  Insulin Pen Needle (B-D UF III MINI PEN NEEDLES) 31G X 5 MM MISC, Once a day with insulin, Disp: 50 each, Rfl: 3 .  Melatonin 5 MG TABS, Take 5 mg by mouth at bedtime. , Disp: , Rfl:  .  metoprolol tartrate (LOPRESSOR) 50 MG tablet, Take 1 tablet (50 mg total) by mouth 2 (two) times daily., Disp: 60 tablet, Rfl: 0 .   nitroGLYCERIN (NITROSTAT) 0.4 MG SL tablet, Place 1 tablet (0.4 mg total) under the tongue every 5 (five) minutes as needed for chest pain., Disp: 30 tablet, Rfl: 0 .  oxyCODONE-acetaminophen (PERCOCET) 7.5-325 MG tablet, Take 1 tablet by mouth 3 (three) times daily as needed for severe pain., Disp: 90 tablet, Rfl: 0 .  pregabalin (LYRICA) 50 MG capsule, Take 1 capsule (50 mg total) by mouth 3 (three) times daily., Disp: 90 capsule, Rfl: 2 .  ranitidine (ZANTAC) 150 MG capsule, Take 150 mg by mouth at bedtime. , Disp: , Rfl:  .  ticagrelor (BRILINTA) 90 MG TABS tablet, Take 1 tablet (90 mg total) by mouth 2 (two) times daily., Disp: 60 tablet, Rfl: 0 .  tiZANidine (ZANAFLEX) 4 MG tablet, Take 1 tablet (4 mg total) by mouth every 8 (eight) hours as needed for muscle spasms., Disp: 90 tablet, Rfl: 5 .  Buprenorphine HCl (BELBUCA) 150 MCG FILM, Place 150 mcg inside cheek every 12 (twelve) hours., Disp: 60 each, Rfl: 1  ROS  Constitutional: Denies any fever or chills Gastrointestinal: No reported hemesis, hematochezia, vomiting, or acute GI distress Musculoskeletal: Denies any acute onset joint swelling, redness, loss of ROM, or weakness Neurological: No reported episodes of acute onset apraxia, aphasia, dysarthria, agnosia, amnesia, paralysis, loss of coordination, or loss of consciousness  Allergies  Mr. Vosler is allergic to lisinopril and ace inhibitors.  PFSH  Drug:  Mr. Sabine  reports that he does not use drugs. Alcohol:  reports that he does not drink alcohol. Tobacco:  reports that he quit smoking about 3 years ago. His smoking use included cigarettes. He has a 10.00 pack-year smoking history. He has never used smokeless tobacco. Medical:  has a past medical history of Angioedema, Bronchitis, DDD (degenerative disc disease), cervical, Diabetes mellitus ([place]), Fatty liver (03/31/2017), GERD (gastroesophageal reflux disease), Gout, HTN (hypertension), Hypertension, IBS (irritable bowel syndrome),  Myocardial infarction ([place]) (08/23/2018), Seasonal allergies, and Spinal stenosis. Surgical: Mr. Jolliff  has a past surgical history that includes Shoulder surgery (Left, 2007); Nasal septum surgery (2004); ACDF with fusion (2007); Testicular torsion (1980s); Vasectomy; Colonoscopy with propofol (N/A, 06/26/2015); Cardiac catheterization (Left, 08/15/2016); Coronary/Graft Acute MI Revascularization (N/A, 08/23/2018); and LEFT HEART CATH AND CORONARY ANGIOGRAPHY (N/A, 08/23/2018). Family: family history includes Allergies in his son; Alzheimer's disease in his maternal grandmother; Arrhythmia in his mother; Breast cancer in his mother; Cancer in his maternal grandfather and mother; Diabetes in his father; Heart disease in his brother; Hypertension in his father; Skin cancer in his mother.  Constitutional Exam  General appearance: Well nourished, well developed, and well hydrated. In no apparent acute distress Vitals:   09/18/18 0904  BP: (!) 149/113  Pulse: 73  Resp: 18  Temp: 99.1 F (37.3 C)  SpO2: 100%  Weight: 222 lb (100.7 kg)  Height: _0  (1.803 m)   BMI Assessment: Estimated body mass index is 30.96 kg/m as calculated from the following:   Height as of this encounter: _1  (1.803 m).   Weight as of this encounter: 222 lb (100.7 kg).  BMI interpretation table: BMI level Category Range association with higher incidence of chronic pain  <18 kg/m2 Underweight   18.5-24.9 kg/m2 Ideal body weight   25-29.9 kg/m2 Overweight Increased incidence by 20%  30-34.9 kg/m2 Obese (Class I) Increased incidence by 68%  35-39.9 kg/m2 Severe obesity (Class II) Increased incidence by 136%  >40 kg/m2 Extreme obesity (Class III) Increased incidence by 254%   Patient's current BMI Ideal Body weight  Body mass index is 30.96 kg/m. Ideal body weight: 75.3 kg (166 lb 0.1 oz) Adjusted ideal body weight: 85.5 kg (188 lb 6.5 oz)   BMI Readings from Last 4 Encounters:  09/18/18 30.96 kg/m  09/10/18  30.11 kg/m  08/30/18 30.73 kg/m  08/25/18 30.82 kg/m   Wt Readings from Last 4 Encounters:  09/18/18 222 lb (100.7 kg)  09/10/18 221 lb 6.4 oz (100.4 kg)  08/30/18 220 lb 4.8 oz (99.9 kg)  08/25/18 221 lb (100.2 kg)  Psych/Mental status: Alert, oriented x 3 (person, place, & time)       Eyes: PERLA Respiratory: No evidence of acute respiratory distress  Cervical Spine Area Exam  Skin & Axial Inspection: No masses, redness, edema, swelling, or associated skin lesions Alignment: Symmetrical Functional ROM: Unrestricted ROM      Stability: No instability detected Muscle Tone/Strength: Functionally intact. No obvious neuro-muscular anomalies detected. Sensory (Neurological): Unimpaired Palpation: No palpable anomalies              Upper Extremity (UE) Exam    Side: Right upper extremity  Side: Left upper extremity  Skin & Extremity Inspection: Skin color, temperature, and hair growth are WNL. No peripheral edema or cyanosis. No masses, redness, swelling, asymmetry, or associated skin lesions. No contractures.  Skin & Extremity Inspection: Skin color, temperature, and hair growth are WNL. No peripheral edema or cyanosis. No masses, redness, swelling,  asymmetry, or associated skin lesions. No contractures.  Functional ROM: Unrestricted ROM          Functional ROM: Unrestricted ROM          Muscle Tone/Strength: Functionally intact. No obvious neuro-muscular anomalies detected.  Muscle Tone/Strength: Functionally intact. No obvious neuro-muscular anomalies detected.  Sensory (Neurological): Unimpaired          Sensory (Neurological): Unimpaired          Palpation: No palpable anomalies              Palpation: No palpable anomalies              Provocative Test(s):  Phalen's test: deferred Tinel's test: deferred Apley's scratch test (touch opposite shoulder):  Action 1 (Across chest): deferred Action 2 (Overhead): deferred Action 3 (LB reach): deferred   Provocative Test(s):  Phalen's  test: deferred Tinel's test: deferred Apley's scratch test (touch opposite shoulder):  Action 1 (Across chest): deferred Action 2 (Overhead): deferred Action 3 (LB reach): deferred    Thoracic Spine Area Exam  Skin & Axial Inspection: No masses, redness, or swelling Alignment: Symmetrical Functional ROM: Unrestricted ROM Stability: No instability detected Muscle Tone/Strength: Functionally intact. No obvious neuro-muscular anomalies detected. Sensory (Neurological): Unimpaired Muscle strength & Tone: No palpable anomalies  Lumbar Spine Area Exam  Skin & Axial Inspection: No masses, redness, or swelling Alignment: Symmetrical Functional ROM: Unrestricted ROM       Stability: No instability detected Muscle Tone/Strength: Functionally intact. No obvious neuro-muscular anomalies detected. Sensory (Neurological): Unimpaired Palpation: No palpable anomalies       Provocative Tests: Hyperextension/rotation test: deferred today       Lumbar quadrant test (Kemp's test): deferred today       Lateral bending test: deferred today       Patrick's Maneuver: deferred today                   FABER test: deferred today                   S-I anterior distraction/compression test: deferred today         S-I lateral compression test: deferred today         S-I Thigh-thrust test: deferred today         S-I Gaenslen's test: deferred today          Gait & Posture Assessment  Ambulation: Unassisted Gait: Relatively normal for age and body habitus Posture: WNL   Lower Extremity Exam    Side: Right lower extremity  Side: Left lower extremity  Stability: No instability observed          Stability: No instability observed          Skin & Extremity Inspection: Skin color, temperature, and hair growth are WNL. No peripheral edema or cyanosis. No masses, redness, swelling, asymmetry, or associated skin lesions. No contractures.  Skin & Extremity Inspection: Skin color, temperature, and hair growth are  WNL. No peripheral edema or cyanosis. No masses, redness, swelling, asymmetry, or associated skin lesions. No contractures.  Functional ROM: Unrestricted ROM                  Functional ROM: Unrestricted ROM                  Muscle Tone/Strength: Functionally intact. No obvious neuro-muscular anomalies detected.  Muscle Tone/Strength: Functionally intact. No obvious neuro-muscular anomalies detected.  Sensory (Neurological): Unimpaired  Sensory (Neurological): Unimpaired  Palpation:  No palpable anomalies  Palpation: No palpable anomalies   Assessment  Primary Diagnosis & Pertinent Problem List: The primary encounter diagnosis was Lumbar radiculopathy. Diagnoses of Spinal stenosis of lumbar region with neurogenic claudication, Degenerative disc disease, lumbar, S/P cervical spinal fusion, Type 2 diabetes mellitus without complication, with long-term current use of insulin (HCC), Chronic pain syndrome, Lumbar facet arthropathy, and Chronic bilateral low back pain without sciatica were also pertinent to this visit.  Status Diagnosis  Responding Persistent Persistent 1. Lumbar radiculopathy   2. Spinal stenosis of lumbar region with neurogenic claudication   3. Degenerative disc disease, lumbar   4. S/P cervical spinal fusion   5. Type 2 diabetes mellitus without complication, with long-term current use of insulin ([place])   6. Chronic pain syndrome   7. Lumbar facet arthropathy   8. Chronic bilateral low back pain without sciatica      General Recommendations: The pain condition that the patient suffers from is best treated with a multidisciplinary approach that involves an increase in physical activity to prevent de-conditioning and worsening of the pain cycle, as well as psychological counseling (formal and/or informal) to address the co-morbid psychological affects of pain. Treatment will often involve judicious use of pain medications and interventional procedures to decrease the pain,  allowing the patient to participate in the physical activity that will ultimately produce long-lasting pain reductions. The goal of the multidisciplinary approach is to return the patient to a higher level of overall function and to restore their ability to perform activities of daily living.   46 year old male follows up status post lumbar epidural steroid injection for lumbar radiculopathy who endorses benefit that is ongoing from the epidural steroid injection.  Patient states that the radiating pain down his left leg is not as severe.  Of note, patient sustained an MI approximately 1 day after epidural steroid injection.  Patient was having vague atypical chest pain for approximately 24 hours, proceeded to the emergency department where his troponins were elevated.  Patient was admitted for monitoring and that night decompensated and was emergently rushed to the Cath Lab for a PCI to the mid left circumflex.  Patient has been participating in cardiac rehab.  He is currently on dual antiplatelet therapy with Brilinta and aspirin.  Patient would not be a candidate for any interventional therapy for at least one year given PCI with drug-eluting stent to mid left circumflex on 08/23/2018.  We will focus primarily on medication management.  Encourage patient to continue with cardiac rehab cardiovascular wellness.  Discussed buprenorphine therapy and how this may be more beneficial for chronic pain as opposed to hydrocodone or oxycodone given its reduce risk of tolerance, dependence, respiratory depression.  Refill tizanidine and Lyrica as below.  Plan of Care  Pharmacotherapy (Medications Ordered): Meds ordered this encounter  Medications  . tiZANidine (ZANAFLEX) 4 MG tablet    Sig: Take 1 tablet (4 mg total) by mouth every 8 (eight) hours as needed for muscle spasms.    Dispense:  90 tablet    Refill:  5  . pregabalin (LYRICA) 50 MG capsule    Sig: Take 1 capsule (50 mg total) by mouth 3 (three)  times daily.    Dispense:  90 capsule    Refill:  2    Do not place this medication, or any other prescription from our practice, on "Automatic Refill". Patient may have prescription filled one day early if pharmacy is closed on scheduled refill date.  . Buprenorphine HCl (BELBUCA)  150 MCG FILM    Sig: Place 150 mcg inside cheek every 12 (twelve) hours.    Dispense:  60 each    Refill:  1    Provider-requested follow-up: Return in about 8 weeks (around 11/13/2018) for Medication Management.  Time Note: Greater than 50% of the 25 minute(s) of face-to-face time spent with Mr. Deloria, was spent in counseling/coordination of care regarding: Mr. Mainer primary cause of pain, the treatment plan, medication side effects, the opioid analgesic risks and possible complications, the results, interpretation and significance of  his recent diagnostic interventional treatment(s), the appropriate use of his medications, realistic expectations, the goals of pain management (increased in functionality), the medication agreement and the patient's responsibilities when it comes to controlled substances.  Future Appointments  Date Time Provider [place]  09/19/2018  7:30 AM [facility] PH[facility] II EX[facility] None  09/21/2018  7:30 AM [facility] None  09/24/2018  4:00 PM [facility]C [facility] None  09/26/2018  4:00 PM [facility]C [facility] None  09/27/2018  4:00 PM [facility]C [facility] None  10/01/2018  4:00 PM [facility] PH[facility]C [facility] None  10/03/2018  4:00 PM [facility]C [facility] None  10/04/2018  4:00 PM [facility]C [facility] None  10/08/2018  4:00 PM [facility] PHASE [facility] EXC [facility] None  10/10/2018  4:00 PM [facility] PH[facility] EXC [facility] None  10/11/2018  4:00 PM [facility] PH[facility] EXC [facility] None  10/15/2018  4:00 PM [facility] PHASE II EXC [facility] None  10/17/2018  4:00 PM [facility]  PHASE II EXC [facility] None  10/18/2018  4:00 PM [facility] PHASE II EXC [facility] None  10/22/2018  4:00 PM [facility] PHASE II EXC [facility] None  10/24/2018  4:00 PM [facility] PHASE II EXC [facility] None  10/25/2018  4:00 PM [facility] PHASE II EXC [facility] None  10/29/2018  4:00 PM [facility] PHASE II EXC [facility] None  10/30/2018  3:40 PM Lada, Satira Anis, MD [place] [facility]  10/31/2018  4:00 PM [facility] PHASE II EXC [facility] None  11/01/2018  4:00 PM [facility] PHASE II EXC [facility] None  11/05/2018  4:00 PM [facility] PHASE II EXC [facility] None  11/07/2018  4:00 PM [facility] PHASE II EXC [facility] None  11/08/2018  4:00 PM [facility] PHASE II EXC [facility] None  11/12/2018  4:00 PM [facility] PHASE II EXC [facility] None  11/13/2018  8:45 AM Gillis Santa, MD [facility] None  11/14/2018  4:00 PM [facility] PHASE II EXC [facility] None  11/15/2018  4:00 PM [facility] PHASE II EXC [facility] None  11/19/2018  4:00 PM [facility] PHASE II EXC [facility] None  11/21/2018  4:00 PM [facility] PHASE II EXC [facility] None  11/26/2018  4:00 PM [facility] PHASE II EXC [facility] None  11/28/2018  4:00 PM [facility] PHASE II EXC [facility] None  11/29/2018  4:00 PM [facility] PHASE II EXC [facility] None  12/03/2018  4:00 PM [facility] PHASE II EXC [facility] None  12/05/2018  4:00 PM [facility] PHASE II EXC [facility] None  12/06/2018  4:00 PM [facility] PHASE II EXC [facility] None  12/10/2018  4:00 PM [facility] PHASE II EXC [facility] None    Primary Care Physician: Arnetha Courser, MD Location: [facility] Note by: Gillis Santa, M.D Date: 09/18/2018; Time: 2:34 PM  Patient Instructions  Belbuca, Lyrica and zanaflex escribed to [facility] [place].

## 2018-09-19 DIAGNOSIS — Z955 Presence of coronary angioplasty implant and graft: Secondary | ICD-10-CM

## 2018-09-19 NOTE — Progress Notes (Signed)
Daily Session Note  Patient Details  Name: John Hartman MRN: [id number] Date of Birth: 04/03/1971 Referring Provider:     Cardiac Rehab from 09/10/2018 in [facility]  Referring Provider  Paraschos, Alexander MD      Encounter Date: 09/19/2018  Check In: Session Check In - 09/19/18 0737      Check-In   Supervising physician immediately available to respond to emergencies  See telemetry face sheet for immediately available ER MD    Location  [facility]    Staff Present  Jessica Hawkins, MA, RCEP, CCRP, Exercise Physiologist;Joseph Hood RCP,RRT,BSRT;Susanne Bice, RN, BSN, CCRP    Medication changes reported      No    Fall or balance concerns reported     No    Warm-up and Cool-down  Performed on first and last piece of equipment    Resistance Training Performed  Yes    VAD Patient?  No      Pain Assessment   Currently in Pain?  No/denies          Social History   Tobacco Use  Smoking Status Former Smoker  . Packs/day: 1.00  . Years: 10.00  . Pack years: 10.00  . Types: Cigarettes  . Last attempt to quit: 04/15/2015  . Years since quitting: 3.4  Smokeless Tobacco Never Used    Goals Met:  Independence with exercise equipment Exercise tolerated well No report of cardiac concerns or symptoms Strength training completed today  Goals Unmet:  Not Applicable  Comments: Pt able to follow exercise prescription today without complaint.  Will continue to monitor for progression.  Reviewed home exercise with pt today.  Pt plans to walking and handweights for exercise.  John is also getting a recumbent bike once he moves to the new house.  Reviewed THR, pulse, RPE, sign and symptoms, NTG use, and when to call 911 or MD.  Also discussed weather considerations and indoor options.  Pt voiced understanding.   Dr. Mark Miller is Medical Director for [facility] and [name]. 

## 2018-09-21 DIAGNOSIS — Z955 Presence of coronary angioplasty implant and graft: Secondary | ICD-10-CM

## 2018-09-21 NOTE — Progress Notes (Signed)
Daily Session Note  Patient Details  Name: John Hartman MRN: [id number] Date of Birth: 11-19-1971 Referring Provider:     Cardiac Rehab from 09/10/2018 in [facility] Cardiac and [facility] Rehab  Referring Provider  Isaias Cowman MD      Encounter Date: 09/21/2018  Check In:      Social History   Tobacco Use  Smoking Status Former Smoker  . Packs/day: 1.00  . Years: 10.00  . Pack years: 10.00  . Types: Cigarettes  . Last attempt to quit: 04/15/2015  . Years since quitting: 3.4  Smokeless Tobacco Never Used    Goals Met:  Independence with exercise equipment Exercise tolerated well No report of cardiac concerns or symptoms Strength training completed today  Goals Unmet:  Not Applicable  Comments: Pt able to follow exercise prescription today without complaint.  Will continue to monitor for progression.    Dr. Emily Filbert is Medical Director for [place] and [name].

## 2018-09-24 DIAGNOSIS — Z955 Presence of coronary angioplasty implant and graft: Secondary | ICD-10-CM | POA: Diagnosis not present

## 2018-09-24 NOTE — Progress Notes (Signed)
Daily Session Note  Patient Details  Name: John Hartman MRN: [id number] Date of Birth: [birth year] Referring Provider:     Cardiac Rehab from 09/10/2018 in [facility] Cardiac [facility] Rehab  Referring Provider  Isaias Cowman MD      Encounter Date: 09/24/2018  Check In: Session Check In - 09/24/18 1711      Check-In   Supervising physician immediately available to respond to emergencies  See telemetry face sheet for immediately available ER MD    Location  [facility]    Staff Present  Justin Mend Jaci Carrel, BS, ACSM CEP, Exercise Physiologist;Susanne Bice, RN, BSN, CCRP    Medication changes reported      No    Fall or balance concerns reported     No    Warm-up and Cool-down  Performed on first and last piece of equipment    Resistance Training Performed  Yes    VAD Patient?  No    PAD/SET Patient?  No      Pain Assessment   Currently in Pain?  No/denies          Social History   Tobacco Use  Smoking Status Former Smoker  . Packs/day: 1.00  . Years: 10.00  . Pack years: 10.00  . Types: Cigarettes  . Last attempt to quit: 04/15/2015  . Years since quitting: 3.4  Smokeless Tobacco Never Used    Goals Met:  Independence with exercise equipment Exercise tolerated well No report of cardiac concerns or symptoms Strength training completed today  Goals Unmet:  Not Applicable  Comments: Pt able to follow exercise prescription today without complaint.  Will continue to monitor for progression.    Dr. Emily Filbert is Medical Director for [place] and [name].

## 2018-09-26 ENCOUNTER — Encounter: Payer: 59 | Attending: Cardiology

## 2018-09-26 DIAGNOSIS — M109 Gout, unspecified: Secondary | ICD-10-CM | POA: Insufficient documentation

## 2018-09-26 DIAGNOSIS — Z79899 Other long term (current) drug therapy: Secondary | ICD-10-CM | POA: Insufficient documentation

## 2018-09-26 DIAGNOSIS — E119 Type 2 diabetes mellitus without complications: Secondary | ICD-10-CM | POA: Insufficient documentation

## 2018-09-26 DIAGNOSIS — Z7982 Long term (current) use of aspirin: Secondary | ICD-10-CM | POA: Diagnosis not present

## 2018-09-26 DIAGNOSIS — I10 Essential (primary) hypertension: Secondary | ICD-10-CM | POA: Diagnosis not present

## 2018-09-26 DIAGNOSIS — Z794 Long term (current) use of insulin: Secondary | ICD-10-CM | POA: Diagnosis not present

## 2018-09-26 DIAGNOSIS — Z955 Presence of coronary angioplasty implant and graft: Secondary | ICD-10-CM | POA: Diagnosis not present

## 2018-09-26 DIAGNOSIS — K219 Gastro-esophageal reflux disease without esophagitis: Secondary | ICD-10-CM | POA: Insufficient documentation

## 2018-09-26 DIAGNOSIS — Z87891 Personal history of nicotine dependence: Secondary | ICD-10-CM | POA: Insufficient documentation

## 2018-09-26 NOTE — Progress Notes (Signed)
Daily Session Note  Patient Details  Name: John Hartman MRN: [id number] Date of Birth: [birth year] Referring Provider:     Cardiac Rehab from 09/10/2018 in [facility] Cardiac and Pulmonary Rehab  Referring Provider  Isaias Cowman MD      Encounter Date: 09/26/2018  Check In:      Social History   Tobacco Use  Smoking Status Former Smoker  . Packs/day: 1.00  . Years: 10.00  . Pack years: 10.00  . Types: Cigarettes  . Last attempt to quit: 04/15/2015  . Years since quitting: 3.4  Smokeless Tobacco Never Used    Goals Met:  Independence with exercise equipment Changing diet to healthy choices, watching portion sizes No report of cardiac concerns or symptoms Strength training completed today  Goals Unmet:  Not Applicable  Comments: Pt able to follow exercise prescription today without complaint.  Will continue to monitor for progression.    Dr. Emily Filbert is Medical Director for [place] and [name].

## 2018-09-27 DIAGNOSIS — Z955 Presence of coronary angioplasty implant and graft: Secondary | ICD-10-CM

## 2018-09-27 NOTE — Progress Notes (Signed)
Daily Session Note  Patient Details  Name: John Hartman MRN: [id number] Date of Birth: 1971/10/26 Referring Provider:     Cardiac Rehab from 09/10/2018 in [facility] Cardiac and Pulmonary Rehab  Referring Provider  Isaias Cowman MD      Encounter Date: 09/27/2018  Check In: Session Check In - 09/27/18 1614      Check-In   Supervising physician immediately available to respond to emergencies  See telemetry face sheet for immediately available ER MD    Location  [facility]    Staff Present  Justin Mend RCP,RRT,BSRT;Meredith Sherryll Burger, RN Moises Blood, BS, ACSM CEP, Exercise Physiologist    Medication changes reported      No    Fall or balance concerns reported     No    Warm-up and Cool-down  Performed on first and last piece of equipment    Resistance Training Performed  Yes    VAD Patient?  No    PAD/SET Patient?  No      Pain Assessment   Currently in Pain?  No/denies          Social History   Tobacco Use  Smoking Status Former Smoker  . Packs/day: 1.00  . Years: 10.00  . Pack years: 10.00  . Types: Cigarettes  . Last attempt to quit: 04/15/2015  . Years since quitting: 3.4  Smokeless Tobacco Never Used    Goals Met:  Independence with exercise equipment Exercise tolerated well No report of cardiac concerns or symptoms Strength training completed today  Goals Unmet:  Not Applicable  Comments: Pt able to follow exercise prescription today without complaint.  Will continue to monitor for progression.    Dr. Emily Filbert is Medical Director for [place] and [name].

## 2018-10-01 DIAGNOSIS — Z955 Presence of coronary angioplasty implant and graft: Secondary | ICD-10-CM | POA: Diagnosis not present

## 2018-10-01 NOTE — Progress Notes (Signed)
Daily Session Note  Patient Details  Name: John Hartman MRN: [id number] Date of Birth: [birth year] Referring Provider:     Cardiac Rehab from 09/10/2018 in [facility] Cardiac and Pulmonary Rehab  Referring Provider  Isaias Cowman MD      Encounter Date: 10/01/2018  Check In: Session Check In - 10/01/18 1714      Check-In   Supervising physician immediately available to respond to emergencies  See telemetry face sheet for immediately available ER MD    Location  [facility]    Staff Present  Renita Papa, RN Vickki Hearing, BA, ACSM CEP, Exercise Physiologist;Kelly Amedeo Plenty, BS, ACSM CEP, Exercise Physiologist;Carroll Enterkin, RN, BSN    Medication changes reported      No    Fall or balance concerns reported     No    Tobacco Cessation  No Change    Warm-up and Cool-down  Performed on first and last piece of equipment    Resistance Training Performed  Yes    VAD Patient?  No    PAD/SET Patient?  No      Pain Assessment   Currently in Pain?  No/denies    Multiple Pain Sites  No          Social History   Tobacco Use  Smoking Status Former Smoker  . Packs/day: 1.00  . Years: 10.00  . Pack years: 10.00  . Types: Cigarettes  . Last attempt to quit: 04/15/2015  . Years since quitting: 3.4  Smokeless Tobacco Never Used    Goals Met:  Independence with exercise equipment Exercise tolerated well No report of cardiac concerns or symptoms Strength training completed today  Goals Unmet:  Not Applicable  Comments: Pt able to follow exercise prescription today without complaint.  Will continue to monitor for progression.    Dr. Emily Filbert is Medical Director for [place] and [name].

## 2018-10-03 ENCOUNTER — Encounter: Payer: 59 | Admitting: *Deleted

## 2018-10-03 ENCOUNTER — Encounter: Payer: Self-pay | Admitting: *Deleted

## 2018-10-03 DIAGNOSIS — Z955 Presence of coronary angioplasty implant and graft: Secondary | ICD-10-CM

## 2018-10-03 NOTE — Progress Notes (Signed)
Daily Session Note  Patient Details  Name: Miriam Kestler MRN: [id number] Date of Birth: 1971/09/18 Referring Provider:     Cardiac Rehab from 09/10/2018 in [facility] Cardiac and Pulmonary Rehab  Referring Provider  Isaias Cowman MD      Encounter Date: 10/03/2018  Check In:      Social History   Tobacco Use  Smoking Status Former Smoker  . Packs/day: 1.00  . Years: 10.00  . Pack years: 10.00  . Types: Cigarettes  . Last attempt to quit: 04/15/2015  . Years since quitting: 3.4  Smokeless Tobacco Never Used    Goals Met:  Proper associated with RPD/PD & O2 Sat Independence with exercise equipment Exercise tolerated well No report of cardiac concerns or symptoms Strength training completed today  Goals Unmet:  Not Applicable  Comments:     Dr. Emily Filbert is Medical Director for [place] and [name].

## 2018-10-03 NOTE — Progress Notes (Signed)
Cardiac Individual Treatment Plan  Patient Details  Name: John Hartman MRN: [id number] Date of Birth: 1971-04-09 Referring Provider:     Cardiac Rehab from 09/10/2018 in [facility] Cardiac and Pulmonary Rehab  Referring Provider  Isaias Cowman MD      Initial Encounter Date:    Cardiac Rehab from 09/10/2018 in [facility] Cardiac and Pulmonary Rehab  Date  09/10/18      Visit Diagnosis: Status post coronary artery stent placement  Patient's Home Medications on Admission:  Current Outpatient Medications:  .  ACCU-CHEK SOFTCLIX LANCETS lancets, USE AS DIRECTED, Disp: 100 each, Rfl: 0 .  allopurinol (ZYLOPRIM) 100 MG tablet, Take 100 mg by mouth every morning. , Disp: , Rfl:  .  aspirin EC 81 MG tablet, Take 81 mg by mouth daily., Disp: , Rfl:  .  atorvastatin (LIPITOR) 80 MG tablet, Take 1 tablet (80 mg total) by mouth daily at 6 PM., Disp: 30 tablet, Rfl: 0 .  [place] alk-PHENObarbital (DONNATAL) 16.2 MG tablet, Take 1 tablet by mouth as needed (pain)., Disp: , Rfl:  .  Buprenorphine HCl (BELBUCA) 150 MCG FILM, Place 150 mcg inside cheek every 12 (twelve) hours., Disp: 60 each, Rfl: 1 .  cetirizine (ZYRTEC) 10 MG tablet, Take 10 mg by mouth at bedtime. , Disp: , Rfl:  .  colchicine 0.6 MG tablet, Take 0.6 mg by mouth daily as needed (gout). , Disp: , Rfl:  .  Dapagliflozin-metFORMIN HCl ER (XIGDUO XR) 04-999 MG TB24, Take 2 tablets by mouth daily. , Disp: , Rfl:  .  diphenhydrAMINE (BENADRYL) 25 mg capsule, Take 50 mg by mouth at bedtime. , Disp: , Rfl:  .  EPINEPHrine (EPIPEN 2-PAK) 0.3 mg/0.3 mL IJ SOAJ injection, Inject 0.3 mg into the muscle once., Disp: , Rfl:  .  glucose blood (ACCU-CHEK ACTIVE STRIPS) test strip, Use as instructed, Disp: 100 each, Rfl: 12 .  glucose blood (ACCU-CHEK AVIVA PLUS) test strip, Check FSBS three to four times per day, Disp: 100 each, Rfl: 1 .  hydrALAZINE (APRESOLINE) 25 MG tablet, Take 1 tablet (25 mg total) by mouth 2 (two) times daily., Disp: 60 tablet,  Rfl: 0 .  hydrochlorothiazide (HYDRODIURIL) 12.5 MG tablet, Take 12.5 mg by mouth daily., Disp: , Rfl:  .  indomethacin (INDOCIN) 25 MG capsule, Take 25 mg by mouth 2 (two) times daily as needed for mild pain., Disp: , Rfl:  .  Insulin Degludec (TRESIBA FLEXTOUCH) 100 UNIT/ML SOPN, Inject 34 Units into the skin at bedtime. , Disp: 1 pen, Rfl: 0 .  Insulin Pen Needle (B-D UF III MINI PEN NEEDLES) 31G X 5 MM MISC, Once a day with insulin, Disp: 50 each, Rfl: 3 .  Melatonin 5 MG TABS, Take 5 mg by mouth at bedtime. , Disp: , Rfl:  .  metoprolol tartrate (LOPRESSOR) 50 MG tablet, Take 1 tablet (50 mg total) by mouth 2 (two) times daily., Disp: 60 tablet, Rfl: 0 .  nitroGLYCERIN (NITROSTAT) 0.4 MG SL tablet, Place 1 tablet (0.4 mg total) under the tongue every 5 (five) minutes as needed for chest pain., Disp: 30 tablet, Rfl: 0 .  oxyCODONE-acetaminophen (PERCOCET) 7.5-325 MG tablet, Take 1 tablet by mouth 3 (three) times daily as needed for severe pain., Disp: 90 tablet, Rfl: 0 .  pregabalin (LYRICA) 50 MG capsule, Take 1 capsule (50 mg total) by mouth 3 (three) times daily., Disp: 90 capsule, Rfl: 2 .  ranitidine (ZANTAC) 150 MG capsule, Take 150 mg by mouth at bedtime. ,  Disp: , Rfl:  .  tiZANidine (ZANAFLEX) 4 MG tablet, Take 1 tablet (4 mg total) by mouth every 8 (eight) hours as needed for muscle spasms., Disp: 90 tablet, Rfl: 5  Past Medical History: Past Medical History:  Diagnosis Date  . Angioedema   . Bronchitis   . DDD (degenerative disc disease), cervical   . Diabetes mellitus ([place])   . Fatty liver 03/31/2017   [place] April 2018  . GERD (gastroesophageal reflux disease)   . Gout   . HTN (hypertension)   . Hypertension    CONTROLLED ON MEDS  . IBS (irritable bowel syndrome)   . Myocardial infarction ([place]) 08/23/2018  . Seasonal allergies   . Spinal stenosis     Tobacco Use: Social History   Tobacco Use  Smoking Status Former Smoker  . Packs/day: 1.00  . Years: 10.00  . Pack  years: 10.00  . Types: Cigarettes  . Last attempt to quit: 04/15/2015  . Years since quitting: 3.4  Smokeless Tobacco Never Used    Labs: Recent Review Flowsheet Data    Labs for ITP Cardiac and Pulmonary Rehab Latest Ref Rng & Units 10/27/2016 12/28/2016 02/10/2017 08/04/2017 08/30/2018   Cholestrol <200 mg/dL 152 135 123 140 147   LDLCALC mg/dL (calc) 105 86 68 88 98   HDL >40 mg/dL 30(L) 30(L) 29(L) 34(L) 26(L)   Trlycerides <150 mg/dL 85 96 132 90 133   Hemoglobin A1c 4.0 - 6.0 % - - - - -       Exercise Target Goals: Exercise Program Goal: Individual exercise prescription set using results from initial 6 min walk test and THRR while considering  patient's activity barriers and safety.   Exercise Prescription Goal: Initial exercise prescription builds to 30-45 minutes a day of aerobic activity, 2-3 days per week.  Home exercise guidelines will be given to patient during program as part of exercise prescription that the participant will acknowledge.  Activity Barriers & Risk Stratification: Activity Barriers & Cardiac Risk Stratification - 09/10/18 1442      Activity Barriers & Cardiac Risk Stratification   Activity Barriers  Back Problems;Neck/Spine Problems;Deconditioning;Muscular Weakness;Other (comment);Arthritis    Comments  chronic back pain, needs back surgery for 4 herniated disc and 2 dislocated, DJD, being seen by pain clinic, mobility limited by pain level on some days    Cardiac Risk Stratification  Moderate       6 Minute Walk: 6 Minute Walk    Row Name 09/10/18 1441         6 Minute Walk   Phase  Initial     Distance  1380 feet     Walk Time  6 minutes     # of Rest Breaks  0     MPH  2.61     METS  4.26     RPE  12     Perceived Dyspnea   2     VO2 Peak  14.93     Symptoms  Yes (comment)     Comments  fatigue     Resting HR  67 bpm     Resting BP  128/54     Resting Oxygen Saturation   97 %     Exercise Oxygen Saturation  during 6 min walk  99 %      Max Ex. HR  91 bpm     Max Ex. BP  138/74     2 Minute Post BP  126/70  Oxygen Initial Assessment:   Oxygen Re-Evaluation:   Oxygen Discharge (Final Oxygen Re-Evaluation):   Initial Exercise Prescription: Initial Exercise Prescription - 09/10/18 1400      Date of Initial Exercise RX and Referring Provider   Date  09/10/18    Referring Provider  Paraschos, Alexander MD      Treadmill   MPH  2.5    Grade  2    Minutes  15    METs  3.6      Recumbant Bike   Level  4    RPM  60    Watts  72    Minutes  15    METs  3      T5 Nustep   Level  4    SPM  80    Minutes  15    METs  3      Prescription Details   Frequency (times per week)  3    Duration  Progress to 45 minutes of aerobic exercise without signs/symptoms of physical distress      Intensity   THRR 40-80% of Max Heartrate  110-153    Ratings of Perceived Exertion  11-13    Perceived Dyspnea  0-4      Progression   Progression  Continue to progress workloads to maintain intensity without signs/symptoms of physical distress.      Resistance Training   Training Prescription  Yes    Weight  4 lbs    Reps  10-15       Perform Capillary Blood Glucose checks as needed.  Exercise Prescription Changes: Exercise Prescription Changes    Row Name 09/10/18 1400 09/19/18 0900 09/24/18 1400         Response to Exercise   Blood Pressure (Admit)  128/54  -  126/72     Blood Pressure (Exercise)  138/74  -  136/62     Blood Pressure (Exit)  126/70  -  108/60     Heart Rate (Admit)  67 bpm  -  86 bpm     Heart Rate (Exercise)  91 bpm  -  128 bpm     Heart Rate (Exit)  75 bpm  -  114 bpm     Oxygen Saturation (Admit)  97 %  -  -     Oxygen Saturation (Exercise)  94 %  -  -     Rating of Perceived Exertion (Exercise)  12  -  14     Perceived Dyspnea (Exercise)  2  -  -     Symptoms  fatigue  -  occasional back pain     Comments  walk test results  -  -     Duration  -  -  Continue with 45 min of  aerobic exercise without signs/symptoms of physical distress.     Intensity  -  -  THRR unchanged       Progression   Progression  -  -  Continue to progress workloads to maintain intensity without signs/symptoms of physical distress.     Average METs  -  -  3.84       Resistance Training   Training Prescription  -  -  Yes     Weight  -  -  7 lbs     Reps  -  -  10-15       Interval Training   Interval Training  -  -  No  Treadmill   MPH  -  -  3     Grade  -  -  2     Minutes  -  -  15     METs  -  -  4.12       Recumbant Bike   Level  -  -  4     Minutes  -  -  15     METs  -  -  4.4       T5 Nustep   Level  -  -  4     Minutes  -  -  15     METs  -  -  3       Home Exercise Plan   Plans to continue exercise at  -  Home (comment) walking, weights, recumbent bike  Home (comment) walking, weights, recumbent bike     Frequency  -  Add 2 additional days to program exercise sessions.  Add 2 additional days to program exercise sessions.     Initial Home Exercises Provided  -  09/19/18  09/19/18        Exercise Comments: Exercise Comments    Row Name 09/12/18 0808           Exercise Comments  First full day of exercise!  Patient was oriented to gym and equipment including functions, settings, policies, and procedures.  Patient's individual exercise prescription and treatment plan were reviewed.  All starting workloads were established based on the results of the 6 minute walk test done at initial orientation visit.  The plan for exercise progression was also introduced and progression will be customized based on patient's performance and goals.          Exercise Goals and Review: Exercise Goals    Row Name 09/10/18 1445             Exercise Goals   Increase Physical Activity  Yes       Intervention  Provide advice, education, support and counseling about physical activity/exercise needs.;Develop an individualized exercise prescription for aerobic and  resistive training based on initial evaluation findings, risk stratification, comorbidities and participant's personal goals.       Expected Outcomes  Short Term: Attend rehab on a regular basis to increase amount of physical activity.;Long Term: Add in home exercise to make exercise part of routine and to increase amount of physical activity.;Long Term: Exercising regularly at least 3-5 days a week.       Increase Strength and Stamina  Yes       Intervention  Provide advice, education, support and counseling about physical activity/exercise needs.;Develop an individualized exercise prescription for aerobic and resistive training based on initial evaluation findings, risk stratification, comorbidities and participant's personal goals.       Expected Outcomes  Short Term: Increase workloads from initial exercise prescription for resistance, speed, and METs.;Short Term: Perform resistance training exercises routinely during rehab and add in resistance training at home;Long Term: Improve cardiorespiratory fitness, muscular endurance and strength as measured by increased METs and functional capacity (6MWT)       Able to understand and use rate of perceived exertion (RPE) scale  Yes       Intervention  Provide education and explanation on how to use RPE scale       Expected Outcomes  Short Term: Able to use RPE daily in rehab to express subjective intensity level;Long Term:  Able to use RPE to guide intensity  level when exercising independently       Able to understand and use Dyspnea scale  Yes       Intervention  Provide education and explanation on how to use Dyspnea scale       Expected Outcomes  Short Term: Able to use Dyspnea scale daily in rehab to express subjective sense of shortness of breath during exertion;Long Term: Able to use Dyspnea scale to guide intensity level when exercising independently       Knowledge and understanding of Target Heart Rate Range (THRR)  Yes       Intervention  Provide  education and explanation of THRR including how the numbers were predicted and where they are located for reference       Expected Outcomes  Short Term: Able to state/look up THRR;Short Term: Able to use daily as guideline for intensity in rehab;Long Term: Able to use THRR to govern intensity when exercising independently       Able to check pulse independently  Yes       Intervention  Provide education and demonstration on how to check pulse in carotid and radial arteries.;Review the importance of being able to check your own pulse for safety during independent exercise       Expected Outcomes  Short Term: Able to explain why pulse checking is important during independent exercise;Long Term: Able to check pulse independently and accurately       Understanding of Exercise Prescription  Yes       Intervention  Provide education, explanation, and written materials on patient's individual exercise prescription       Expected Outcomes  Short Term: Able to explain program exercise prescription;Long Term: Able to explain home exercise prescription to exercise independently          Exercise Goals Re-Evaluation : Exercise Goals Re-Evaluation    Row Name 09/12/18 9450 09/19/18 0908 09/24/18 1420         Exercise Goal Re-Evaluation   Exercise Goals Review  Knowledge and understanding of Target Heart Rate Range (THRR);Able to understand and use rate of perceived exertion (RPE) scale;Understanding of Exercise Prescription  Knowledge and understanding of Target Heart Rate Range (THRR);Able to understand and use rate of perceived exertion (RPE) scale;Understanding of Exercise Prescription;Increase Physical Activity;Able to check pulse independently;Increase Strength and Stamina  Understanding of Exercise Prescription;Increase Physical Activity;Increase Strength and Stamina     Comments  Reviewed RPE scale, THR and program prescription with pt today.  Pt voiced understanding and was given a copy of goals to  take home.   Reviewed home exercise with pt today.  Pt plans to walking and handweights for exercise.  [name] is also getting a recumbent bike once he moves to the new house.  Reviewed THR, pulse, RPE, sign and symptoms, NTG use, and when to call 911 or MD.  Also discussed weather considerations and indoor options.  Pt voiced understanding.  [name] is off to a good start in rehab.  He would like to try intervals on his seated equipment as his back is his biggest limitation.  He has already increased to 3.0 mph on the treadmill.  We will continue to monitor his progression.      Expected Outcomes  Short: Use RPE daily to regulate intensity. Long: Follow program prescription in THR.  Short: Start to add in exercise more at home.  Long: Start to increase activity levels more at home.   Short: Start intervals.  Long: Continue to increase strength and  stamina.         Discharge Exercise Prescription (Final Exercise Prescription Changes): Exercise Prescription Changes - 09/24/18 1400      Response to Exercise   Blood Pressure (Admit)  126/72    Blood Pressure (Exercise)  136/62    Blood Pressure (Exit)  108/60    Heart Rate (Admit)  86 bpm    Heart Rate (Exercise)  128 bpm    Heart Rate (Exit)  114 bpm    Rating of Perceived Exertion (Exercise)  14    Symptoms  occasional back pain    Duration  Continue with 45 min of aerobic exercise without signs/symptoms of physical distress.    Intensity  THRR unchanged      Progression   Progression  Continue to progress workloads to maintain intensity without signs/symptoms of physical distress.    Average METs  3.84      Resistance Training   Training Prescription  Yes    Weight  7 lbs    Reps  10-15      Interval Training   Interval Training  No      Treadmill   MPH  3    Grade  2    Minutes  15    METs  4.12      Recumbant Bike   Level  4    Minutes  15    METs  4.4      T5 Nustep   Level  4    Minutes  15    METs  3      Home  Exercise Plan   Plans to continue exercise at  Home (comment)   walking, weights, recumbent bike   Frequency  Add 2 additional days to program exercise sessions.    Initial Home Exercises Provided  09/19/18       Nutrition:  Target Goals: Understanding of nutrition guidelines, daily intake of sodium <1531m, cholesterol <2050m calories 30% from fat and 7% or less from saturated fats, daily to have 5 or more servings of fruits and vegetables.  Biometrics: Pre Biometrics - 09/10/18 1446      Pre Biometrics   Height  5' 11.9" (1.826 m)    Weight  221 lb 6.4 oz (100.4 kg)    Waist Circumference  40.5 inches    Hip Circumference  39 inches    Waist to Hip Ratio  1.04 %    BMI (Calculated)  30.12    Single Leg Stand  29.53 seconds        Nutrition Therapy Plan and Nutrition Goals: Nutrition Therapy & Goals - 09/19/18 1516      Nutrition Therapy   Diet  DM    Drug/Food Interactions  Purine/Gout    Protein (specify units)  12oz    Fiber  35 grams    Whole Grain Foods  3 servings   chooses whole grains regularly   Saturated Fats  16 max. grams    Fruits and Vegetables  8 servings/day    Sodium  1500 grams      Personal Nutrition Goals   Nutrition Goal  Start to pay more attention to the amount of sodium you are consuming each day    Personal Goal #2  Continue to choose lean protein sources, whole grains, sugar free beverages, high protein and fiber snack options (protein shake + fruit, hummus + carrots), and fruits and vegetables daily- great job for making all these changes!    Personal Goal #  3  Now that you've started to implement breakfast daily and eating out less often, work to make these changes perminant lifestyle habits    Comments  Since his heart attack he has gone "all in" on making changes to his diet to improve his health. He no longer eats out often, chooses more whole foods, does not eat fried foods (uses an air fryer), eats breakfast, chooses nutrient-dense snacks,  and chooses sugar free beverages. He works with his wife to prepare meals for the week in advance and has reduced his portion sizes. Wt loss of 27# in approx 3 months. He reports to have dramatically improved his HgbA1c level      Intervention Plan   Intervention  Prescribe, educate and counsel regarding individualized specific dietary modifications aiming towards targeted core components such as weight, hypertension, lipid management, diabetes, heart failure and other comorbidities.    Expected Outcomes  Short Term Goal: Understand basic principles of dietary content, such as calories, fat, sodium, cholesterol and nutrients.;Short Term Goal: A plan has been developed with personal nutrition goals set during dietitian appointment.;Long Term Goal: Adherence to prescribed nutrition plan.       Nutrition Assessments: Nutrition Assessments - 09/10/18 1428      MEDFICTS Scores   Pre Score  30       Nutrition Goals Re-Evaluation: Nutrition Goals Re-Evaluation    [place] Name 09/19/18 1527             Goals   Nutrition Goal  Continue to choose lean protein sources, whole grains, sugar free beverages, high protein and fiber snack options (protein shake + fruit, hummus + carrots), and fruits and vegetables daily- great job for making all these changes!       Comment  Since his heart attack he has completely changed his approach to eating and has improved overall quality. He has been able to lose weight and improve labs such as his A1c       Expected Outcome  He will adopt new diet practices as perminant lifestyle changes and continue to improve his health outcomes         Personal Goal #2 Re-Evaluation   Personal Goal #2  Start to pay more attention to the amount of sodium you are consuming each day         Personal Goal #3 Re-Evaluation   Personal Goal #3  Now that you've started to implement breakfast daily and eating out less often, work to make these changes perminant lifestyle habits           Nutrition Goals Discharge (Final Nutrition Goals Re-Evaluation): Nutrition Goals Re-Evaluation - 09/19/18 1527      Goals   Nutrition Goal  Continue to choose lean protein sources, whole grains, sugar free beverages, high protein and fiber snack options (protein shake + fruit, hummus + carrots), and fruits and vegetables daily- great job for making all these changes!    Comment  Since his heart attack he has completely changed his approach to eating and has improved overall quality. He has been able to lose weight and improve labs such as his A1c    Expected Outcome  He will adopt new diet practices as perminant lifestyle changes and continue to improve his health outcomes      Personal Goal #2 Re-Evaluation   Personal Goal #2  Start to pay more attention to the amount of sodium you are consuming each day      Personal Goal #3 Re-Evaluation  Personal Goal #3  Now that you've started to implement breakfast daily and eating out less often, work to make these changes perminant lifestyle habits       Psychosocial: Target Goals: Acknowledge presence or absence of significant depression and/or stress, maximize coping skills, provide positive support system. Participant is able to verbalize types and ability to use techniques and skills needed for reducing stress and depression.   Initial Review & Psychosocial Screening: Initial Psych Review & Screening - 09/10/18 1423      Initial Review   Current issues with  Current Stress Concerns    Source of Stress Concerns  Chronic Illness;Unable to participate in former interests or hobbies;Unable to perform yard/household activities    Comments  [name] has a lot of issues with his back. He was actually in the process of being cleared for back surgery when he had his heart attack. His back hinders him from doing a lot that he would want to do.       Family Dynamics   Good Support System?  Yes   wife     Barriers   Psychosocial barriers to  participate in program  The patient should benefit from training in stress management and relaxation.;There are no identifiable barriers or psychosocial needs.      Screening Interventions   Interventions  Encouraged to exercise;Program counselor consult;To provide support and resources with identified psychosocial needs;Provide feedback about the scores to participant    Expected Outcomes  Short Term goal: Utilizing psychosocial counselor, staff and physician to assist with identification of specific Stressors or current issues interfering with healing process. Setting desired goal for each stressor or current issue identified.;Long Term Goal: Stressors or current issues are controlled or eliminated.;Short Term goal: Identification and review with participant of any Quality of Life or Depression concerns found by scoring the questionnaire.;Long Term goal: The participant improves quality of Life and PHQ9 Scores as seen by post scores and/or verbalization of changes       Quality of Life Scores:  Quality of Life - 09/10/18 1425      Quality of Life   Select  Quality of Life      Quality of Life Scores   Health/Function Pre  15.5 %    Socioeconomic Pre  23.36 %    Psych/Spiritual Pre  16.5 %    Family Pre  28.8 %    GLOBAL Pre  19.28 %      Scores of 19 and below usually indicate a poorer quality of life in these areas.  A difference of  2-3 points is a clinically meaningful difference.  A difference of 2-3 points in the total score of the Quality of Life Index has been associated with significant improvement in overall quality of life, self-image, physical symptoms, and general health in studies assessing change in quality of life.  PHQ-9: Recent Review Flowsheet Data    Depression screen [facility] 2/9 09/18/2018 09/10/2018 08/09/2018 07/17/2018 06/12/2018   Decreased Interest 0 0 0 0 0   Down, Depressed, Hopeless 1 0 0 0 0   PHQ - 2 Score 1 0 0 0 0   Altered sleeping - 0 - - -   Tired, decreased  energy - 2 - - -   Change in appetite - 1 - - -   Feeling bad or failure about yourself  - 1 - - -   Trouble concentrating - 0 - - -   Moving slowly or fidgety/restless - 0 - - -  Suicidal thoughts - 0 - - -   PHQ-9 Score - 4 - - -   Difficult doing work/chores - Very difficult - - -     Interpretation of Total Score  Total Score Depression Severity:  1-4 = Minimal depression, 5-9 = Mild depression, 10-14 = Moderate depression, 15-19 = Moderately severe depression, 20-27 = Severe depression   Psychosocial Evaluation and Intervention: Psychosocial Evaluation - 09/12/18 0939      Psychosocial Evaluation & Interventions   Interventions  Relaxation education;Stress management education;Encouraged to exercise with the program and follow exercise prescription    Comments  Counselor met with Mr. Kaczmarczyk [name]) today for initial psychosocial evaluation.  He is a 47 year old who had a heart attack and stent inserted on 8/29.  He has a strong support system with a spouse of 15 years; parents and brother in [place]; and active involvement in his local church community.  [name] has multiple health issues with diabetes; chronic back pain awaiting surgery; and a recent diagnosis of sleep apnea in addition to his heart condition.  [name] has a history of sleep problems but reports since the stent is sleeping much better.  His appetite has improved and he has lost weight approx 30 pounds (as recommended) over the past few months.  [name] denies a history of depression or anxiety or any current symptoms, although he admits to some mood symptoms due to his chronic back pain.  He reports his mood is typically positive in spite of all of this.  He has multiple stressors with building a house; his back pain and the limitations that presents.  [name] has goals to improve his heart health; get back to work on 9/30 and be able to do more normal activities.  He will be followed with staff.      Expected Outcomes  Short:   [name] will exercise consistently to improve his heart health and as a positive coping strategy for his stress.  Long:  [name] will develop a positive self-care routine for his health and his mental health.      Continue Psychosocial Services   Follow up required by staff       Psychosocial Re-Evaluation:   Psychosocial Discharge (Final Psychosocial Re-Evaluation):   Vocational Rehabilitation: Provide vocational rehab assistance to qualifying candidates.   Vocational Rehab Evaluation & Intervention: Vocational Rehab - 09/10/18 1423      Initial Vocational Rehab Evaluation & Intervention   Assessment shows need for Vocational Rehabilitation  No       Education: Education Goals: Education classes will be provided on a variety of topics geared toward better understanding of heart health and risk factor modification. Participant will state understanding/return demonstration of topics presented as noted by education test scores.  Learning Barriers/Preferences: Learning Barriers/Preferences - 09/10/18 1419      Learning Barriers/Preferences   Learning Barriers  None    Learning Preferences  None       Education Topics:  AED/CPR: - Group verbal and written instruction with the use of models to demonstrate the basic use of the AED with the basic ABC's of resuscitation.   General Nutrition Guidelines/Fats and Fiber: -Group instruction provided by verbal, written material, models and posters to present the general guidelines for heart healthy nutrition. Gives an explanation and review of dietary fats and fiber.   Controlling Sodium/Reading Food Labels: -Group verbal and written material supporting the discussion of sodium use in heart healthy nutrition. Review and explanation with models, verbal and written  materials for utilization of the food label.   Cardiac Rehab from 10/01/2018 in [facility] Cardiac and Pulmonary Rehab  Date  09/12/18  Educator  [facility]  Instruction Review Code  1-  Verbalizes Understanding      Exercise Physiology & General Exercise Guidelines: - Group verbal and written instruction with models to review the exercise physiology of the cardiovascular system and associated critical values. Provides general exercise guidelines with specific guidelines to those with heart or lung disease.    Cardiac Rehab from 10/01/2018 in [facility] Cardiac and Pulmonary Rehab  Date  09/19/18  Educator  [facility]  Instruction Review Code  1- Verbalizes Understanding      Aerobic Exercise & Resistance Training: - Gives group verbal and written instruction on the various components of exercise. Focuses on aerobic and resistive training programs and the benefits of this training and how to safely progress through these programs..   Cardiac Rehab from 10/01/2018 in [facility] Cardiac and Pulmonary Rehab  Date  09/24/18  Educator  [facility]  Instruction Review Code  1- Geologist, engineering, Balance, Mind/Body Relaxation: Provides group verbal/written instruction on the benefits of flexibility and balance training, including mind/body exercise modes such as yoga, pilates and tai chi.  Demonstration and skill practice provided.   Cardiac Rehab from 10/01/2018 in [facility] Cardiac and Pulmonary Rehab  Date  10/01/18  Educator  AS  Instruction Review Code  1- Verbalizes Understanding      Stress and Anxiety: - Provides group verbal and written instruction about the health risks of elevated stress and causes of high stress.  Discuss the correlation between heart/lung disease and anxiety and treatment options. Review healthy ways to manage with stress and anxiety.   Depression: - Provides group verbal and written instruction on the correlation between heart/lung disease and depressed mood, treatment options, and the stigmas associated with seeking treatment.   Anatomy & Physiology of the Heart: - Group verbal and written instruction and models provide basic cardiac anatomy and  physiology, with the coronary electrical and arterial systems. Review of Valvular disease and Heart Failure   Cardiac Procedures: - Group verbal and written instruction to review commonly prescribed medications for heart disease. Reviews the medication, class of the drug, and side effects. Includes the steps to properly store meds and maintain the prescription regimen. (beta blockers and nitrates)   Cardiac Medications I: - Group verbal and written instruction to review commonly prescribed medications for heart disease. Reviews the medication, class of the drug, and side effects. Includes the steps to properly store meds and maintain the prescription regimen.   Cardiac Medications II: -Group verbal and written instruction to review commonly prescribed medications for heart disease. Reviews the medication, class of the drug, and side effects. (all other drug classes)    Go Sex-Intimacy & Heart Disease, Get SMART - Goal Setting: - Group verbal and written instruction through game format to discuss heart disease and the return to sexual intimacy. Provides group verbal and written material to discuss and apply goal setting through the application of the S.M.A.R.T. Method.   Other Matters of the Heart: - Provides group verbal, written materials and models to describe Stable Angina and Peripheral Artery. Includes description of the disease process and treatment options available to the cardiac patient.   Exercise & Equipment Safety: - Individual verbal instruction and demonstration of equipment use and safety with use of the equipment.   Cardiac Rehab from 10/01/2018 in [facility] Cardiac and Pulmonary Rehab  Date  09/10/18  Educator  [facility]  Instruction Review Code  1- Verbalizes Understanding      Infection Prevention: - Provides verbal and written material to individual with discussion of infection control including proper hand washing and proper equipment cleaning during exercise session.    Cardiac Rehab from 10/01/2018 in [facility] Cardiac and Pulmonary Rehab  Date  09/10/18  Educator  [facility]  Instruction Review Code  1- Verbalizes Understanding      Falls Prevention: - Provides verbal and written material to individual with discussion of falls prevention and safety.   Cardiac Rehab from 10/01/2018 in [facility] Cardiac and Pulmonary Rehab  Date  09/10/18  Educator  [facility]  Instruction Review Code  1- Verbalizes Understanding      Diabetes: - Individual verbal and written instruction to review signs/symptoms of diabetes, desired ranges of glucose level fasting, after meals and with exercise. Acknowledge that pre and post exercise glucose checks will be done for 3 sessions at entry of program.   Cardiac Rehab from 10/01/2018 in [facility] Cardiac and Pulmonary Rehab  Date  09/10/18  Educator  [facility]  Instruction Review Code  1- Verbalizes Understanding      Know Your Numbers and Risk Factors: -Group verbal and written instruction about important numbers in your health.  Discussion of what are risk factors and how they play a role in the disease process.  Review of Cholesterol, Blood Pressure, Diabetes, and BMI and the role they play in your overall health.   Sleep Hygiene: -Provides group verbal and written instruction about how sleep can affect your health.  Define sleep hygiene, discuss sleep cycles and impact of sleep habits. Review good sleep hygiene tips.    Other: -Provides group and verbal instruction on various topics (see comments)   Knowledge Questionnaire Score: Knowledge Questionnaire Score - 09/10/18 1419      Knowledge Questionnaire Score   Pre Score  22/26   correct answers reviewed with [name], focus on nutrition, exercise, MI      Core Components/Risk Factors/Patient Goals at Admission: Personal Goals and Risk Factors at Admission - 09/10/18 1411      Core Components/Risk Factors/Patient Goals on Admission    Weight Management  Yes;Weight Loss    Intervention  Weight  Management: Develop a combined nutrition and exercise program designed to reach desired caloric intake, while maintaining appropriate intake of nutrient and fiber, sodium and fats, and appropriate energy expenditure required for the weight goal.;Weight Management: Provide education and appropriate resources to help participant work on and attain dietary goals.;Weight Management/Obesity: Establish reasonable short term and long term weight goals.    Admit Weight  221 lb 6.4 oz (100.4 kg)    Goal Weight: Short Term  217 lb (98.4 kg)    Goal Weight: Long Term  200 lb (90.7 kg)    Expected Outcomes  Short Term: Continue to assess and modify interventions until short term weight is achieved;Long Term: Adherence to nutrition and physical activity/exercise program aimed toward attainment of established weight goal;Weight Loss: Understanding of general recommendations for a balanced deficit meal plan, which promotes 1-2 lb weight loss per week and includes a negative energy balance of [phone] kcal/d;Understanding recommendations for meals to include 15-35% energy as protein, 25-35% energy from fat, 35-60% energy from carbohydrates, less than 270m of dietary cholesterol, 20-35 gm of total fiber daily;Understanding of distribution of calorie intake throughout the day with the consumption of 4-5 meals/snacks    Diabetes  Yes    Intervention  Provide education about signs/symptoms and action to take for hypo/hyperglycemia.;Provide education about proper nutrition, including hydration, and aerobic/resistive exercise prescription along with prescribed medications to achieve blood glucose in normal ranges: Fasting glucose 65-99 mg/dL    Expected Outcomes  Short Term: Participant verbalizes understanding of the signs/symptoms and immediate care of hyper/hypoglycemia, proper foot care and importance of medication, aerobic/resistive exercise and nutrition plan for blood glucose control.;Long Term: Attainment of HbA1C < 7%.     Hypertension  Yes    Intervention  Provide education on lifestyle modifcations including regular physical activity/exercise, weight management, moderate sodium restriction and increased consumption of fresh fruit, vegetables, and low fat dairy, alcohol moderation, and smoking cessation.;Monitor prescription use compliance.    Expected Outcomes  Short Term: Continued assessment and intervention until BP is < 140/52m HG in hypertensive participants. < 130/81mHG in hypertensive participants with diabetes, heart failure or chronic kidney disease.;Long Term: Maintenance of blood pressure at goal levels.       Core Components/Risk Factors/Patient Goals Review:    Core Components/Risk Factors/Patient Goals at Discharge (Final Review):    ITP Comments: ITP Comments    Row Name 09/10/18 1400 10/03/18 0650         ITP Comments  Med Review completed. Initial ITP created. [facility]  30 day [facility].  Continue with ITP unless directed changes per Medical Director review.         Comments:

## 2018-10-04 DIAGNOSIS — Z955 Presence of coronary angioplasty implant and graft: Secondary | ICD-10-CM | POA: Diagnosis not present

## 2018-10-04 NOTE — Progress Notes (Signed)
Daily Session Note  Patient Details  Name: John Hartman MRN: [id number] Date of Birth: May 14, 1971 Referring Provider:     Cardiac Rehab from 09/10/2018 in [facility] Cardiac and Pulmonary Rehab  Referring Provider  Isaias Cowman MD      Encounter Date: 10/04/2018  Check In: Session Check In - 10/04/18 1626      Check-In   Supervising physician immediately available to respond to emergencies  See telemetry face sheet for immediately available ER MD    Location  [facility]-Cardiac & Pulmonary Rehab    Staff Present  Renita Papa, RN BSN;Ram Haugan Flavia Shipper;Vida Rigger RN, BSN;Susanne Bice, RN, BSN, CCRP    Medication changes reported      No    Fall or balance concerns reported     No    Warm-up and Cool-down  Performed on first and last piece of equipment    Resistance Training Performed  Yes    VAD Patient?  No    PAD/SET Patient?  No      Pain Assessment   Currently in Pain?  No/denies          Social History   Tobacco Use  Smoking Status Former Smoker  . Packs/day: 1.00  . Years: 10.00  . Pack years: 10.00  . Types: Cigarettes  . Last attempt to quit: 04/15/2015  . Years since quitting: 3.4  Smokeless Tobacco Never Used    Goals Met:  Independence with exercise equipment Exercise tolerated well No report of cardiac concerns or symptoms Strength training completed today  Goals Unmet:  Not Applicable  Comments: Pt able to follow exercise prescription today without complaint.  Will continue to monitor for progression.    Dr. Emily Filbert is Medical Director for [place] and [name].

## 2018-10-08 DIAGNOSIS — Z955 Presence of coronary angioplasty implant and graft: Secondary | ICD-10-CM

## 2018-10-08 NOTE — Progress Notes (Signed)
Daily Session Note  Patient Details  Name: John Hartman MRN: [id number] Date of Birth: 14-Sep-1971 Referring Provider:     Cardiac Rehab from 09/10/2018 in [facility] Cardiac and Pulmonary Rehab  Referring Provider  Isaias Cowman MD      Encounter Date: 10/08/2018  Check In: Session Check In - 10/08/18 1727      Check-In   Supervising physician immediately available to respond to emergencies  See telemetry face sheet for immediately available ER MD    Location  [facility]-Cardiac & Pulmonary Rehab    Staff Present  Nyoka Cowden, RN, BSN, Kela Millin, BA, ACSM CEP, Exercise Physiologist;Kelly Amedeo Plenty, BS, ACSM CEP, Exercise Physiologist    Medication changes reported      No    Fall or balance concerns reported     No    Warm-up and Cool-down  Performed on first and last piece of equipment    Resistance Training Performed  Yes    VAD Patient?  No    PAD/SET Patient?  No      Pain Assessment   Currently in Pain?  No/denies    Multiple Pain Sites  No          Social History   Tobacco Use  Smoking Status Former Smoker  . Packs/day: 1.00  . Years: 10.00  . Pack years: 10.00  . Types: Cigarettes  . Last attempt to quit: 04/15/2015  . Years since quitting: 3.4  Smokeless Tobacco Never Used    Goals Met:  Independence with exercise equipment Exercise tolerated well No report of cardiac concerns or symptoms Strength training completed today  Goals Unmet:  Not Applicable  Comments: Pt able to follow exercise prescription today without complaint.  Will continue to monitor for progression.    Dr. Emily Filbert is Medical Director for [place] and [name].

## 2018-10-10 ENCOUNTER — Encounter: Payer: 59 | Admitting: *Deleted

## 2018-10-10 DIAGNOSIS — Z955 Presence of coronary angioplasty implant and graft: Secondary | ICD-10-CM

## 2018-10-10 NOTE — Progress Notes (Signed)
Daily Session Note  Patient Details  Name: John Hartman MRN: [id number] Date of Birth: July 17, 1971 Referring Provider:     Cardiac Rehab from 09/10/2018 in [facility] Cardiac and Pulmonary Rehab  Referring Provider  Isaias Cowman MD      Encounter Date: 10/10/2018  Check In: Session Check In - 10/10/18 1620      Check-In   Supervising physician immediately available to respond to emergencies  See telemetry face sheet for immediately available ER MD    Location  [facility]-Cardiac & Pulmonary Rehab    Staff Present  Renita Papa, RN Vickki Hearing, BA, ACSM CEP, Exercise Physiologist;Eldor Conaway, RN, BSN    Medication changes reported      No    Fall or balance concerns reported     No    Tobacco Cessation  No Change    Warm-up and Cool-down  Performed on first and last piece of equipment    Resistance Training Performed  Yes    VAD Patient?  No    PAD/SET Patient?  No      Pain Assessment   Currently in Pain?  No/denies          Social History   Tobacco Use  Smoking Status Former Smoker  . Packs/day: 1.00  . Years: 10.00  . Pack years: 10.00  . Types: Cigarettes  . Last attempt to quit: 04/15/2015  . Years since quitting: 3.4  Smokeless Tobacco Never Used    Goals Met:  Proper associated with RPD/PD & O2 Sat No report of cardiac concerns or symptoms Strength training completed today  Goals Unmet:  Not Applicable  Comments:    Dr. Emily Filbert is Medical Director for [place] and [name].

## 2018-10-11 DIAGNOSIS — Z955 Presence of coronary angioplasty implant and graft: Secondary | ICD-10-CM

## 2018-10-11 NOTE — Progress Notes (Signed)
Daily Session Note  Patient Details  Name: John Hartman MRN: [id number] Date of Birth: Oct 14, 1971 Referring Provider:     Cardiac Rehab from 09/10/2018 in [facility] Cardiac and Pulmonary Rehab  Referring Provider  Isaias Cowman MD      Encounter Date: 10/11/2018  Check In:      Social History   Tobacco Use  Smoking Status Former Smoker  . Packs/day: 1.00  . Years: 10.00  . Pack years: 10.00  . Types: Cigarettes  . Last attempt to quit: 04/15/2015  . Years since quitting: 3.4  Smokeless Tobacco Never Used    Goals Met:  Independence with exercise equipment Exercise tolerated well No report of cardiac concerns or symptoms Strength training completed today  Goals Unmet:  Not Applicable  Comments: Pt able to follow exercise prescription today without complaint.  Will continue to monitor for progression.    Dr. Emily Filbert is Medical Director for [place] and [name].

## 2018-10-15 DIAGNOSIS — Z955 Presence of coronary angioplasty implant and graft: Secondary | ICD-10-CM | POA: Diagnosis not present

## 2018-10-15 NOTE — Progress Notes (Signed)
Daily Session Note  Patient Details  Name: Torri Langston MRN: [id number] Date of Birth: Nov 08, 1971 Referring Provider:     Cardiac Rehab from 09/10/2018 in [facility] Cardiac and Pulmonary Rehab  Referring Provider  Isaias Cowman MD      Encounter Date: 10/15/2018  Check In: Session Check In - 10/15/18 1640      Check-In   Supervising physician immediately available to respond to emergencies  See telemetry face sheet for immediately available ER MD    Location  [facility]-Cardiac & Pulmonary Rehab    Staff Present  Gerlene Burdock, RN, Moises Blood, BS, ACSM CEP, Exercise Physiologist;Amanda Oletta Darter, BA, ACSM CEP, Exercise Physiologist    Medication changes reported      No    Fall or balance concerns reported     No    Warm-up and Cool-down  Performed on first and last piece of equipment    Resistance Training Performed  Yes    VAD Patient?  No    PAD/SET Patient?  No      Pain Assessment   Currently in Pain?  No/denies    Multiple Pain Sites  No          Social History   Tobacco Use  Smoking Status Former Smoker  . Packs/day: 1.00  . Years: 10.00  . Pack years: 10.00  . Types: Cigarettes  . Last attempt to quit: 04/15/2015  . Years since quitting: 3.5  Smokeless Tobacco Never Used    Goals Met:  Independence with exercise equipment Exercise tolerated well No report of cardiac concerns or symptoms Strength training completed today  Goals Unmet:  Not Applicable  Comments: Pt able to follow exercise prescription today without complaint.  Will continue to monitor for progression.    Dr. Emily Filbert is Medical Director for [place] and [name].

## 2018-10-17 ENCOUNTER — Encounter: Payer: Self-pay | Admitting: Emergency Medicine

## 2018-10-17 ENCOUNTER — Other Ambulatory Visit: Payer: Self-pay

## 2018-10-17 ENCOUNTER — Telehealth: Payer: Self-pay | Admitting: Gastroenterology

## 2018-10-17 ENCOUNTER — Ambulatory Visit
Admission: EM | Admit: 2018-10-17 | Discharge: 2018-10-17 | Disposition: A | Discharge status: Home / Self Care | Payer: 59

## 2018-10-17 DIAGNOSIS — Z5181 Encounter for therapeutic drug level monitoring: Secondary | ICD-10-CM | POA: Diagnosis not present

## 2018-10-17 DIAGNOSIS — Z7902 Long term (current) use of antithrombotics/antiplatelets: Secondary | ICD-10-CM

## 2018-10-17 DIAGNOSIS — K921 Melena: Secondary | ICD-10-CM

## 2018-10-17 DIAGNOSIS — K649 Unspecified hemorrhoids: Secondary | ICD-10-CM

## 2018-10-17 DIAGNOSIS — K625 Hemorrhage of anus and rectum: Secondary | ICD-10-CM

## 2018-10-17 LAB — CBC WITH DIFFERENTIAL/PLATELET
Abs Immature Granulocytes: 0.04 10*3/uL (ref 0.00–0.07)
Basophils Absolute: 0.1 10*3/uL (ref 0.0–0.1)
Basophils Relative: 1 %
EOS ABS: 0.8 10*3/uL — AB (ref 0.0–0.5)
EOS PCT: 10 %
HEMATOCRIT: 48.3 % (ref 39.0–52.0)
HEMOGLOBIN: 15.6 g/dL (ref 13.0–17.0)
Immature Granulocytes: 1 %
LYMPHS PCT: 31 %
Lymphs Abs: 2.7 10*3/uL (ref 0.7–4.0)
MCH: 27.8 pg (ref 26.0–34.0)
MCHC: 32.3 g/dL (ref 30.0–36.0)
MCV: 86.1 fL (ref 80.0–100.0)
MONOS PCT: 8 %
Monocytes Absolute: 0.7 10*3/uL (ref 0.1–1.0)
Neutro Abs: 4.1 10*3/uL (ref 1.7–7.7)
Neutrophils Relative %: 49 %
Platelets: 220 10*3/uL (ref 150–400)
RBC: 5.61 MIL/uL (ref 4.22–5.81)
RDW: 12.6 % (ref 11.5–15.5)
WBC: 8.5 10*3/uL (ref 4.0–10.5)
nRBC: 0 % (ref 0.0–0.2)

## 2018-10-17 LAB — COMPREHENSIVE METABOLIC PANEL
ALK PHOS: 89 U/L (ref 38–126)
ALT: 42 U/L (ref 0–44)
ANION GAP: 10 (ref 5–15)
AST: 37 U/L (ref 15–41)
Albumin: 4.8 g/dL (ref 3.5–5.0)
BILIRUBIN TOTAL: 1.5 mg/dL — AB (ref 0.3–1.2)
BUN: 17 mg/dL (ref 6–20)
CALCIUM: 9.4 mg/dL (ref 8.9–10.3)
CO2: 29 mmol/L (ref 22–32)
Chloride: 103 mmol/L (ref 98–111)
Creatinine, Ser: 1.04 mg/dL (ref 0.61–1.24)
GFR calc Af Amer: >60 mL/min (ref >60)
GLUCOSE: 129 mg/dL — AB (ref 70–99)
Potassium: 3.9 mmol/L (ref 3.5–5.1)
Sodium: 142 mmol/L (ref 135–145)
TOTAL PROTEIN: 8.2 g/dL — AB (ref 6.5–8.1)

## 2018-10-17 NOTE — ED Provider Notes (Signed)
[facility]    CSN: [id number] Arrival date & time: 10/17/18  1353     History   Chief Complaint Chief Complaint  Patient presents with  . Blood In Stools    HPI John Hartman is a 47 y.o. male. Patient presents concerned about bright red blood on his bed sheets and in his underwear this morning. He says it was "a lot of blood" and he continued to have bleeding until this afternoon. He denies abdominal or rectal pain. Denies fever, diarrhea, constipation, n/v, and fever. He is taking Brilinta since he had an MI about 2 months ago. He called his cardiologist this morning to discuss the bleeding issue and was advised to go to ED or urgent care for evaluation. He has a history of hemorrhoids and IBS. He denies recent or current bleeding and has no associated pain.   HPI  Past Medical History:  Diagnosis Date  . Angioedema   . Bronchitis   . DDD (degenerative disc disease), cervical   . Diabetes mellitus ([place])   . Fatty liver 03/31/2017   [place] April 2018  . GERD (gastroesophageal reflux disease)   . Gout   . HTN (hypertension)   . Hypertension    CONTROLLED ON MEDS  . IBS (irritable bowel syndrome)   . Myocardial infarction ([place]) 08/23/2018  . Seasonal allergies   . Spinal stenosis     Patient Active Problem List   Diagnosis Date Noted  . NSTEMI (non-ST elevated myocardial infarction) ([place]) 08/22/2018  . Elevated hemoglobin ([place]) 08/11/2017  . Chronic lower back pain 08/04/2017  . Degenerative disc disease, lumbar 08/04/2017  . 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 Calcification of abdominal aorta (H[facility]) 08/04/2017  . Fatty liver 03/31/2017  . Elevated liver enzymes 02/11/2017  . Serum total bilirubin elevated 02/11/2017  . Hyperlipidemia LDL goal <70 01/04/2017  . Elevated serum glutamic pyruvic transaminase (SGPT) level 01/04/2017  . Angioedema   . Encounter for medication monitoring 08/25/2016  . Coronary artery disease 08/25/2016  . Diabetes mellitus  ([place])   . Chest pain 08/08/2016  . Preventative health care 08/02/2016  . Ketonuria 07/12/2016  . Glucosuria 07/12/2016  . 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 Palpitations 08/12/2015  . Family history of malignant neoplasm of gastrointestinal tract   . Benign neoplasm of rectosigmoid junction   . Essential hypertension 05/28/2015  . Gout 05/28/2015  . IBS (irritable bowel syndrome) 05/28/2015    Past Surgical History:  Procedure Laterality Date  . ACDF with fusion  2007  . CARDIAC CATHETERIZATION Left 08/15/2016   Procedure: Left Heart Cath and Coronary Angiography;  Surgeon: Wellington Hampshire, MD;  Location: [facility];  Service: Cardiovascular;  Laterality: Left;  . COLONOSCOPY WITH PROPOFOL N/A 06/26/2015   Procedure: COLONOSCOPY WITH PROPOFOL;  Surgeon: Lucilla Lame, MD;  Location: [facility];  Service: Endoscopy;  Laterality: N/A;  with biopsies  . CORONARY/GRAFT ACUTE MI REVASCULARIZATION N/A 08/23/2018   Procedure: Coronary/Graft Acute MI Revascularization;  Surgeon: Isaias Cowman, MD;  Location: [facility];  Service: Cardiovascular;  Laterality: N/A;  . LEFT HEART CATH AND CORONARY ANGIOGRAPHY N/A 08/23/2018   Procedure: LEFT HEART CATH AND CORONARY ANGIOGRAPHY;  Surgeon: Isaias Cowman, MD;  Location: [facility];  Service: Cardiovascular;  Laterality: N/A;  . NASAL SEPTUM SURGERY  2004  . SHOULDER SURGERY Left 2007  . Testicular torsion  1980s  . VASECTOMY         Home Medications  Prior to Admission medications   Medication Sig Start Date End Date Taking? Authorizing Provider  ACCU-CHEK SOFTCLIX LANCETS lancets USE AS DIRECTED 07/23/16  Yes Lada, Satira Anis, MD  allopurinol (ZYLOPRIM) 100 MG tablet Take 100 mg by mouth every morning.    Yes [provider]  aspirin EC 81 MG tablet Take 81 mg by mouth daily.   Yes [provider]  atorvastatin (LIPITOR) 80 MG tablet Take 1 tablet (80 mg total) by mouth daily  at 6 PM. 08/25/18 10/17/18 Yes Pyreddy, Reatha Harps, MD  [place] alk-PHENObarbital (DONNATAL) 16.2 MG tablet Take 1 tablet by mouth as needed (pain).   Yes [provider]  Buprenorphine HCl (BELBUCA) 150 MCG FILM Place 150 mcg inside cheek every 12 (twelve) hours. 09/18/18  Yes Gillis Santa, MD  cetirizine (ZYRTEC) 10 MG tablet Take 10 mg by mouth at bedtime.    Yes [provider]  colchicine 0.6 MG tablet Take 0.6 mg by mouth daily as needed (gout).    Yes [provider]  Dapagliflozin-metFORMIN HCl ER (XIGDUO XR) 04-999 MG TB24 Take 2 tablets by mouth daily.    Yes [provider]  diphenhydrAMINE (BENADRYL) 25 mg capsule Take 50 mg by mouth at bedtime.    Yes [provider]  EPINEPHrine (EPIPEN 2-PAK) 0.3 mg/0.3 mL IJ SOAJ injection Inject 0.3 mg into the muscle once.   Yes [provider]  glucose blood (ACCU-CHEK ACTIVE STRIPS) test strip Use as instructed 07/09/16  Yes Lada, Satira Anis, MD  hydrALAZINE (APRESOLINE) 25 MG tablet Take 1 tablet (25 mg total) by mouth 2 (two) times daily. 08/25/18 10/17/18 Yes Pyreddy, Reatha Harps, MD  hydrochlorothiazide (HYDRODIURIL) 12.5 MG tablet Take 12.5 mg by mouth daily.   Yes [provider]  indomethacin (INDOCIN) 25 MG capsule Take 25 mg by mouth 2 (two) times daily as needed for mild pain.   Yes [provider]  Insulin Degludec (TRESIBA FLEXTOUCH) 100 UNIT/ML SOPN Inject 34 Units into the skin at bedtime.  07/12/16  Yes Lada, Satira Anis, MD  Insulin Pen Needle (B-D UF III MINI PEN NEEDLES) 31G X 5 MM MISC Once a day with insulin 07/09/16  Yes Lada, Satira Anis, MD  Melatonin 5 MG TABS Take 5 mg by mouth at bedtime.    Yes [provider]  metoprolol tartrate (LOPRESSOR) 50 MG tablet Take 1 tablet (50 mg total) by mouth 2 (two) times daily. 08/25/18 10/17/18 Yes Pyreddy, Reatha Harps, MD  nitroGLYCERIN (NITROSTAT) 0.4 MG SL tablet Place 1 tablet (0.4 mg total) under the tongue every 5 (five)  minutes as needed for chest pain. 08/25/18  Yes Pyreddy, Reatha Harps, MD  pregabalin (LYRICA) 50 MG capsule Take 1 capsule (50 mg total) by mouth 3 (three) times daily. 09/18/18  Yes Gillis Santa, MD  ranitidine (ZANTAC) 150 MG capsule Take 150 mg by mouth at bedtime.    Yes [provider]  ticagrelor (BRILINTA) 90 MG TABS tablet Take 90 mg by mouth 2 (two) times daily.   Yes [provider]  tiZANidine (ZANAFLEX) 4 MG tablet Take 1 tablet (4 mg total) by mouth every 8 (eight) hours as needed for muscle spasms. 09/18/18  Yes Gillis Santa, MD  glucose blood (ACCU-CHEK AVIVA PLUS) test strip Check FSBS three to four times per day 07/23/16   Arnetha Courser, MD  oxyCODONE-acetaminophen (PERCOCET) 7.5-325 MG tablet Take 1 tablet by mouth 3 (three) times daily as needed for severe pain. 08/09/18   Gillis Santa, MD  Family History Family History  Problem Relation Age of Onset  . Breast cancer Mother   . Skin cancer Mother   . Arrhythmia Mother        A-fib  . Cancer Mother        breast, colon?, skin  . Hypertension Father   . Diabetes Father   . Cancer Maternal Grandfather   . Heart disease Brother        stent  . Allergies Son   . Alzheimer's disease Maternal Grandmother   . Stroke Neg Hx   . COPD Neg Hx     Social History Social History   Tobacco Use  . Smoking status: Former Smoker    Packs/day: 1.00    Years: 10.00    Pack years: 10.00    Types: Cigarettes    Last attempt to quit: 04/15/2015    Years since quitting: 3.5  . Smokeless tobacco: Never Used  Substance Use Topics  . Alcohol use: No    Alcohol/week: 0.0 standard drinks  . Drug use: No     Allergies   Lisinopril and Ace inhibitors   Review of Systems Review of Systems  Constitutional: Positive for fatigue. Negative for activity change, appetite change and fever.  HENT: Negative for mouth sores, nosebleeds and sore throat.   Respiratory: Negative for cough, chest tightness and shortness of  breath.   Cardiovascular: Negative for chest pain and palpitations.  Gastrointestinal: Positive for anal bleeding. Negative for abdominal distention, abdominal pain, blood in stool, constipation, diarrhea, nausea, rectal pain and vomiting.  Genitourinary: Negative for dysuria, flank pain, hematuria and urgency.  Musculoskeletal: Positive for back pain (chronic back pain). Negative for arthralgias and myalgias.  Skin: Positive for rash (small scattered red bumps on arms and legs). Negative for wound.  Neurological: Negative for dizziness, weakness, light-headedness and headaches.  Hematological: Negative for adenopathy. Bruises/bleeds easily.     Physical Exam Triage Vital Signs ED Triage Vitals [10/17/18 1407]  Enc Vitals Group     BP (!) 140/102     Pulse Rate 73     Resp 16     Temp 98.5 F (36.9 C)     Temp Source Oral     SpO2 98 %     Weight 218 lb (98.9 kg)     Height '5\' 11"'$  (1.803 m)     Head Circumference      Peak Flow      Pain Score 0     Pain Loc      Pain Edu?      Excl. in [place]?    No data found.  Updated Vital Signs BP (!) 132/96 (BP Location: Right Arm)   Pulse 73   Temp 98.5 F (36.9 C) (Oral)   Resp 16   Ht '5\' 11"'$  (1.803 m)   Wt 218 lb (98.9 kg)   SpO2 98%   BMI 30.40 kg/m       Physical Exam  Constitutional: He is oriented to person, place, and time. He appears well-developed and well-nourished. No distress.  HENT:  Head: Normocephalic and atraumatic.  Right Ear: External ear normal.  Left Ear: External ear normal.  Nose: Nose normal.  Mouth/Throat: Oropharynx is clear and moist. No oropharyngeal exudate.  Eyes: Pupils are equal, round, and reactive to light. Conjunctivae are normal. Right eye exhibits no discharge. Left eye exhibits no discharge. No scleral icterus.  Neck: Neck supple.  Cardiovascular: Normal rate, regular rhythm and normal heart sounds.  No  murmur heard. Pulmonary/Chest: Effort normal and breath sounds normal. No  respiratory distress. He has no wheezes. He has no rales.  Abdominal: Soft. Bowel sounds are normal. He exhibits no distension and no mass. There is tenderness (mild LLQ tenderness to deep palpation). There is no rebound and no guarding. No hernia.  RECTAL: +external and internal hemorrhoids w/o bleeding, no stool noted. No pain on exam. No active bleeding.  Lymphadenopathy:    He has no cervical adenopathy.  Neurological: He is alert and oriented to person, place, and time.  Skin: Skin is warm and dry. He is not diaphoretic.  Petechiae and purpura (mild) of distal arms and legs bilaterally. No blanching   Psychiatric: He has a normal mood and affect. His behavior is normal.  Nursing note and vitals reviewed.    UC Treatments / Results  Labs (all labs ordered are listed, but only abnormal results are displayed) Labs Reviewed  CBC WITH DIFFERENTIAL/PLATELET - Abnormal; Notable for the following components:      Result Value   Eosinophils Absolute 0.8 (*)    All other components within normal limits  COMPREHENSIVE METABOLIC PANEL - Abnormal; Notable for the following components:   Glucose, Bld 129 (*)    Total Protein 8.2 (*)    Total Bilirubin 1.5 (*)    All other components within normal limits    EKG None  Radiology No results found.  Procedures Procedures (including critical care time)  Medications Ordered in UC Medications - No data to display  Initial Impression / Assessment and Plan / UC Course  I have reviewed the triage vital signs and the nursing notes.  Pertinent labs & imaging results that were available during my care of the patient were reviewed by me and considered in my medical decision making (see chart for details).   On exam, internal and external hemorrhoids are present without bleeding. CBC is WNL. Hemoccult collected with error so this was discontinued. Denies gross blood in urine and recent nosebleeds. Spoke with Clabe Seal, [place] [phone]) for his  cardiologist and she advised if low concern for lower GI bleed, recommends he continue Brilinta and Aspirin at this time and f/u with his GI doctor as soon as possible for scope. She also agreed that if he has another episode with a large amount of rectal bleeding,  He should go to ER for scope. Also if petechia or purpura worsen, go to ER.   Also go to ER for abdominal pain, nausea/vomiting. Fever, constipation, diarrhea, dark tarry stools, or any other concerns about worsening of condition.   Final Clinical Impressions(s) / UC Diagnoses   Final diagnoses:  Rectal bleeding  Encounter for monitoring antiplatelet therapy  Hematochezia  Hemorrhoids, unspecified hemorrhoid type     Discharge Instructions     RECTAL BLEEDING: On exam, internal and external hemorrhoids are present without bleeding. CBC is WNL. Spoke with Clabe Seal, [place] for his cardiologist and she advised if low concern for lower GI bleed, recommends he continue Brilinta and Aspirin at this time and f/u with his GI doctor as soon as possible for scope. She also agreed that if he has another episode with a large amount of rectal bleeding,  He should go to ER for scope. Also if petechia or purpura worsen, go to ER.   For hemorrhoids--warm sitz baths, preparation H, increase water intake, and stool softeners if needed.     ED Prescriptions    None     Controlled Substance Prescriptions [place]  Controlled Substance Registry consulted? Not Applicable   Gretta Cool 10/18/18 7034

## 2018-10-17 NOTE — Telephone Encounter (Signed)
Patient called in stating his Cardiologist wanted him see ASAP(today if possible) He is an existing patient of Dr Dorothey Baseman and had a heart attack about 6wks ago. He has hemorrhoids but they normally don't bother him however this morning he woke up with blood everywhere this am. The cardiologist wanted to make sure he's not bleeding from somewhere else.Please advise patient

## 2018-10-17 NOTE — ED Triage Notes (Signed)
Patient in today stating that when he woke up this morning he had blood in his underwear and on the sheets. Patient had a heart attack on 08/23/18 and has been on blood thinners. Patient has a history of hemorrhoids. Patient called his cardiologist and they ordered blood work and advised patient to go to his GI doctor. Patient couldn't get in with GI and was advised to come to urgent care. Patient also had red bumps on his arms and legs.

## 2018-10-17 NOTE — Telephone Encounter (Signed)
Could you please see if John Hartman can see him sometime this week? She deals with all the hemorrhoids and Dr. Allen Norris isn't seeing any pt's for the rest of the week. Thanks!

## 2018-10-17 NOTE — Discharge Instructions (Signed)
RECTAL BLEEDING: On exam, internal and external hemorrhoids are present without bleeding. CBC is WNL. Spoke with Clabe Seal, PA for his cardiologist and she advised if low concern for lower GI bleed, recommends he continue Brilinta and Aspirin at this time and f/u with his GI doctor as soon as possible for scope. She also agreed that if he has another episode with a large amount of rectal bleeding,  He should go to ER for scope. Also if petechia or purpura worsen, go to ER.   For hemorrhoids--warm sitz baths, preparation H, increase water intake, and stool softeners if needed.

## 2018-10-18 DIAGNOSIS — Z955 Presence of coronary angioplasty implant and graft: Secondary | ICD-10-CM

## 2018-10-18 NOTE — Telephone Encounter (Signed)
I CALLED PATIENT TODAY TO OFFER AN APPOINTMENT FOR TODAY 10-18-18 @ 2:30PM ,BUT PATIENT STATES HE WAS SEEN YESTERDAY @ URGENT CARE.

## 2018-10-18 NOTE — Progress Notes (Signed)
Daily Session Note  Patient Details  Name: John Hartman MRN: [id number] Date of Birth: 03-21-1971 Referring Provider:     Cardiac Rehab from 09/10/2018 in [facility]  Referring Provider  Isaias Cowman MD      Encounter Date: 10/18/2018  Check In: Session Check In - 10/18/18 1616      Check-In   Supervising physician immediately available to respond to emergencies  See telemetry face sheet for immediately available ER MD    Location  [facility]    Staff Present  Renita Papa, RN BSN;Xavia Kniskern [address] [place], [place], ACSM CEP, Exercise Physiologist    Medication changes reported      No    Fall or balance concerns reported     No    Warm-up and Cool-down  Performed on first and last piece of equipment    Resistance Training Performed  Yes    VAD Patient?  No    PAD/SET Patient?  No      Pain Assessment   Currently in Pain?  No/denies          Social History   Tobacco Use  Smoking Status Former Smoker  . Packs/day: 1.00  . Years: 10.00  . Pack years: 10.00  . Types: Cigarettes  . Last attempt to quit: 04/15/2015  . Years since quitting: 3.5  Smokeless Tobacco Never Used    Goals Met:  Independence with exercise equipment Exercise tolerated well No report of cardiac concerns or symptoms Strength training completed today  Goals Unmet:  Not Applicable  Comments: Pt able to follow exercise prescription today without complaint.  Will continue to monitor for progression.    Dr. Emily Filbert is Medical Director for [facility] and [name].

## 2018-10-22 DIAGNOSIS — Z955 Presence of coronary angioplasty implant and graft: Secondary | ICD-10-CM

## 2018-10-22 NOTE — Progress Notes (Signed)
Daily Session Note  Patient Details  Name: Jaysen Wey MRN: [id number] Date of Birth: 02-Jul-1971 Referring Provider:     Cardiac Rehab from 09/10/2018 in [facility] Rehab  Referring Provider  Isaias Cowman MD      Encounter Date: 10/22/2018  Check In: Session Check In - 10/22/18 1653      Check-In   Supervising physician immediately available to respond to emergencies  See telemetry face sheet for immediately available ER MD    Location  [facility]    Staff Present  Nada Maclachlan, BA, ACSM CEP, Exercise Physiologist;Carroll Enterkin, RN, Moises Blood, BS, ACSM CEP, Exercise Physiologist    Medication changes reported      No    Fall or balance concerns reported     No    Warm-up and Cool-down  Performed on first and last piece of equipment    Resistance Training Performed  Yes    VAD Patient?  No    PAD/SET Patient?  No      Pain Assessment   Currently in Pain?  No/denies    Multiple Pain Sites  No          Social History   Tobacco Use  Smoking Status Former Smoker  . Packs/day: 1.00  . Years: 10.00  . Pack years: 10.00  . Types: Cigarettes  . Last attempt to quit: 04/15/2015  . Years since quitting: 3.5  Smokeless Tobacco Never Used    Goals Met:  Independence with exercise equipment Exercise tolerated well No report of cardiac concerns or symptoms Strength training completed today  Goals Unmet:  Not Applicable  Comments: Pt able to follow exercise prescription today without complaint.  Will continue to monitor for progression.    Dr. Emily Filbert is Medical Director for [place] [name].

## 2018-10-24 ENCOUNTER — Encounter: Payer: 59 | Admitting: *Deleted

## 2018-10-24 DIAGNOSIS — Z955 Presence of coronary angioplasty implant and graft: Secondary | ICD-10-CM | POA: Diagnosis not present

## 2018-10-24 NOTE — Progress Notes (Signed)
Daily Session Note  Patient Details  Name: John Hartman MRN: [id number] Date of Birth: 03/24/1971 Referring Provider:     Cardiac Rehab from 09/10/2018 in [facility]  Referring Provider  Isaias Cowman MD      Encounter Date: 10/24/2018  Check In: Session Check In - 10/24/18 1647      Check-In   Supervising physician immediately available to respond to emergencies  See telemetry face sheet for immediately available ER MD    Location  [facility]    Staff Present  Renita Papa, RN Vickki Hearing, BA, ACSM CEP, Exercise Physiologist;Carroll Enterkin, RN, BSN    Medication changes reported      No    Fall or balance concerns reported     No    Tobacco Cessation  No Change    Warm-up and Cool-down  Performed on first and last piece of equipment    Resistance Training Performed  Yes    VAD Patient?  No    PAD/SET Patient?  No      Pain Assessment   Currently in Pain?  No/denies          Social History   Tobacco Use  Smoking Status Former Smoker  . Packs/day: 1.00  . Years: 10.00  . Pack years: 10.00  . Types: Cigarettes  . Last attempt to quit: 04/15/2015  . Years since quitting: 3.5  Smokeless Tobacco Never Used    Goals Met:  Independence with exercise equipment Exercise tolerated well No report of cardiac concerns or symptoms Strength training completed today  Goals Unmet:  Not Applicable  Comments: Pt able to follow exercise prescription today without complaint.  Will continue to monitor for progression.    Dr. Emily Filbert is Medical Director for [place] [name].

## 2018-10-25 ENCOUNTER — Encounter: Payer: 59 | Admitting: *Deleted

## 2018-10-25 DIAGNOSIS — Z955 Presence of coronary angioplasty implant and graft: Secondary | ICD-10-CM | POA: Diagnosis not present

## 2018-10-25 NOTE — Progress Notes (Signed)
Daily Session Note  Patient Details  Name: Kameren Pargas MRN: [id number] Date of Birth: 1971-06-14 Referring Provider:     Cardiac Rehab from 09/10/2018 in [facility]monary Rehab  Referring Provider  Isaias Cowman MD      Encounter Date: 10/25/2018  Check In: Session Check In - 10/25/18 7414      Check-In   Supervising physician immediately available to respond to emergencies  See telemetry face sheet for immediately available ER MD    Location  [facility]    Staff Present  Nyoka Cowden, RN, BSN, Willette Pa, MA, RCEP, CCRP, Exercise Physiologist;Amanda Oletta Darter, [place], ACSM CEP, Exercise Physiologist    Medication changes reported      No    Fall or balance concerns reported     No    Warm-up and Cool-down  Performed on first and last piece of equipment    Resistance Training Performed  Yes    VAD Patient?  No    PAD/SET Patient?  No      Pain Assessment   Currently in Pain?  No/denies          Social History   Tobacco Use  Smoking Status Former Smoker  . Packs/day: 1.00  . Years: 10.00  . Pack years: 10.00  . Types: Cigarettes  . Last attempt to quit: 04/15/2015  . Years since quitting: 3.5  Smokeless Tobacco Never Used    Goals Met:  Independence with exercise equipment Exercise tolerated well No report of cardiac concerns or symptoms Strength training completed today  Goals Unmet:  Not Applicable  Comments: Pt able to follow exercise prescription today without complaint.  Will continue to monitor for progression.    Dr. Emily Filbert is Medical Director for [place] [name].

## 2018-10-28 ENCOUNTER — Encounter: Payer: Self-pay | Admitting: Student in an Organized Health Care Education/Training Program

## 2018-10-29 ENCOUNTER — Encounter: Payer: 59 | Attending: Cardiology | Admitting: *Deleted

## 2018-10-29 DIAGNOSIS — Z87891 Personal history of nicotine dependence: Secondary | ICD-10-CM | POA: Insufficient documentation

## 2018-10-29 DIAGNOSIS — Z79899 Other long term (current) drug therapy: Secondary | ICD-10-CM | POA: Diagnosis not present

## 2018-10-29 DIAGNOSIS — Z7982 Long term (current) use of aspirin: Secondary | ICD-10-CM | POA: Diagnosis not present

## 2018-10-29 DIAGNOSIS — E119 Type 2 diabetes mellitus without complications: Secondary | ICD-10-CM | POA: Insufficient documentation

## 2018-10-29 DIAGNOSIS — Z955 Presence of coronary angioplasty implant and graft: Secondary | ICD-10-CM | POA: Diagnosis not present

## 2018-10-29 DIAGNOSIS — M109 Gout, unspecified: Secondary | ICD-10-CM | POA: Diagnosis not present

## 2018-10-29 DIAGNOSIS — K219 Gastro-esophageal reflux disease without esophagitis: Secondary | ICD-10-CM | POA: Diagnosis not present

## 2018-10-29 DIAGNOSIS — I10 Essential (primary) hypertension: Secondary | ICD-10-CM | POA: Insufficient documentation

## 2018-10-29 DIAGNOSIS — Z794 Long term (current) use of insulin: Secondary | ICD-10-CM | POA: Diagnosis not present

## 2018-10-29 NOTE — Patient Instructions (Addendum)
Discharge Patient Instructions  Patient Details  Name: John Hartman MRN: [id number] Date of Birth: 01-01-1971 Referring Provider:  Isaias Cowman, MD   Number of Visits: 60  Reason for Discharge:  Patient reached a stable level of exercise. Patient independent in their exercise. Patient has met program and personal goals.  Smoking History:  Social History   Tobacco Use  Smoking Status Former Smoker  . Packs/day: 1.00  . Years: 10.00  . Pack years: 10.00  . Types: Cigarettes  . Last attempt to quit: 04/15/2015  . Years since quitting: 3.5  Smokeless Tobacco Never Used    Diagnosis:  Status post coronary artery stent placement  Initial Exercise Prescription: Initial Exercise Prescription - 09/10/18 1400      Date of Initial Exercise RX and Referring Provider   Date  09/10/18    Referring Provider  Isaias Cowman MD      Treadmill   MPH  2.5    Grade  2    Minutes  15    METs  3.6      Recumbant Bike   Level  4    RPM  60    Watts  72    Minutes  15    METs  3      T5 Nustep   Level  4    SPM  80    Minutes  15    METs  3      Prescription Details   Frequency (times per week)  3    Duration  Progress to 45 minutes of aerobic exercise without signs/symptoms of physical distress      Intensity   THRR 40-80% of Max Heartrate  110-153    Ratings of Perceived Exertion  11-13    Perceived Dyspnea  0-4      Progression   Progression  Continue to progress workloads to maintain intensity without signs/symptoms of physical distress.      Resistance Training   Training Prescription  Yes    Weight  4 lbs    Reps  10-15       Discharge Exercise Prescription (Final Exercise Prescription Changes): Exercise Prescription Changes - 10/23/18 1500      Response to Exercise   Blood Pressure (Admit)  120/80    Blood Pressure (Exercise)  188/94    Blood Pressure (Exit)  130/82    Heart Rate (Admit)  60 bpm    Heart Rate (Exercise)  158 bpm    Heart  Rate (Exit)  113 bpm    Rating of Perceived Exertion (Exercise)  13    Symptoms  none    Duration  Continue with 45 min of aerobic exercise without signs/symptoms of physical distress.    Intensity  THRR unchanged      Progression   Progression  Continue to progress workloads to maintain intensity without signs/symptoms of physical distress.    Average METs  4.5      Resistance Training   Training Prescription  Yes    Weight  7 lb    Reps  10-15      Interval Training   Interval Training  No      Treadmill   MPH  3.3    Grade  2.5    Minutes  15    METs  4.66      Recumbant Bike   Level  4    RPM  60    Minutes  15    METs  6      T5 Nustep   Level  6    Minutes  15    METs  2.6       Functional Capacity: 6 Minute Walk    Row Name 09/10/18 1441 10/29/18 0858       6 Minute Walk   Phase  Initial  Discharge    Distance  1380 feet  2000 feet    Distance % Change  -  44.9 %    Distance Feet Change  -  620 ft    Walk Time  6 minutes  6 minutes    # of Rest Breaks  0  0    MPH  2.61  3.78    METS  4.26  5.95    RPE  12  13    Perceived Dyspnea   2  -    VO2 Peak  14.93  20.83    Symptoms  Yes (comment)  Yes (comment)    Comments  fatigue  calves tired    Resting HR  67 bpm  78 bpm    Resting BP  128/54  126/72    Resting Oxygen Saturation   97 %  99 %    Exercise Oxygen Saturation  during 6 min walk  99 %  95 %    Max Ex. HR  91 bpm  128 bpm    Max Ex. BP  138/74  154/78    2 Minute Post BP  126/70  -       Quality of Life: Quality of Life - 10/29/18 0908      Quality of Life   Select  Quality of Life      Quality of Life Scores   Health/Function Pre  15.5 %    Health/Function Post  20.7 %    Health/Function % Change  33.55 %    Socioeconomic Pre  23.36 %    Socioeconomic Post  23 %    Socioeconomic % Change   -1.54 %    Psych/Spiritual Pre  16.5 %    Psych/Spiritual Post  21.71 %    Psych/Spiritual % Change  31.58 %    Family Pre  28.8 %     Family Post  26.4 %    Family % Change  -8.33 %    GLOBAL Pre  19.28 %    GLOBAL Post  22.22 %    GLOBAL % Change  15.25 %       Personal Goals: Goals established at orientation with interventions provided to work toward goal. Personal Goals and Risk Factors at Admission - 09/10/18 1411      Core Components/Risk Factors/Patient Goals on Admission    Weight Management  Yes;Weight Loss    Intervention  Weight Management: Develop a combined nutrition and exercise program designed to reach desired caloric intake, while maintaining appropriate intake of nutrient and fiber, sodium and fats, and appropriate energy expenditure required for the weight goal.;Weight Management: Provide education and appropriate resources to help participant work on and attain dietary goals.;Weight Management/Obesity: Establish reasonable short term and long term weight goals.    Admit Weight  221 lb 6.4 oz (100.4 kg)    Goal Weight: Short Term  217 lb (98.4 kg)    Goal Weight: Long Term  200 lb (90.7 kg)    Expected Outcomes  Short Term: Continue to assess and modify interventions until short term weight is achieved;Long Term: Adherence to nutrition and physical  activity/exercise program aimed toward attainment of established weight goal;Weight Loss: Understanding of general recommendations for a balanced deficit meal plan, which promotes 1-2 lb weight loss per week and includes a negative energy balance of [phone] kcal/d;Understanding recommendations for meals to include 15-35% energy as protein, 25-35% energy from fat, 35-60% energy from carbohydrates, less than 26m of dietary cholesterol, 20-35 gm of total fiber daily;Understanding of distribution of calorie intake throughout the day with the consumption of 4-5 meals/snacks    Diabetes  Yes    Intervention  Provide education about signs/symptoms and action to take for hypo/hyperglycemia.;Provide education about proper nutrition, including hydration, and  aerobic/resistive exercise prescription along with prescribed medications to achieve blood glucose in normal ranges: Fasting glucose 65-99 mg/dL    Expected Outcomes  Short Term: Participant verbalizes understanding of the signs/symptoms and immediate care of hyper/hypoglycemia, proper foot care and importance of medication, aerobic/resistive exercise and nutrition plan for blood glucose control.;Long Term: Attainment of HbA1C < 7%.    Hypertension  Yes    Intervention  Provide education on lifestyle modifcations including regular physical activity/exercise, weight management, moderate sodium restriction and increased consumption of fresh fruit, vegetables, and low fat dairy, alcohol moderation, and smoking cessation.;Monitor prescription use compliance.    Expected Outcomes  Short Term: Continued assessment and intervention until BP is < 140/960mHG in hypertensive participants. < 130/8071mG in hypertensive participants with diabetes, heart failure or chronic kidney disease.;Long Term: Maintenance of blood pressure at goal levels.        Personal Goals Discharge: Goals and Risk Factor Review - 10/15/18 1700      Core Components/Risk Factors/Patient Goals Review   Personal Goals Review  Weight Management/Obesity;Lipids;Hypertension;Stress;Diabetes    Review  SteRichardson Landry taking all meds as directed.  He is maintaining current weight.  He can tell his clothes fit better.  He added interval training to exercise last week and likes the challenge of reaching higher levels.  His family is moving to a new home soon and he is excited to have a new kitchen set up like he wants to cook.  He is also getting a recumbent bike to use at home.    Expected Outcomes  Short - continue to attend HT Long - maintain exercise on hi sown       Exercise Goals and Review: Exercise Goals    Row Name 09/10/18 1445             Exercise Goals   Increase Physical Activity  Yes       Intervention  Provide advice,  education, support and counseling about physical activity/exercise needs.;Develop an individualized exercise prescription for aerobic and resistive training based on initial evaluation findings, risk stratification, comorbidities and participant's personal goals.       Expected Outcomes  Short Term: Attend rehab on a regular basis to increase amount of physical activity.;Long Term: Add in home exercise to make exercise part of routine and to increase amount of physical activity.;Long Term: Exercising regularly at least 3-5 days a week.       Increase Strength and Stamina  Yes       Intervention  Provide advice, education, support and counseling about physical activity/exercise needs.;Develop an individualized exercise prescription for aerobic and resistive training based on initial evaluation findings, risk stratification, comorbidities and participant's personal goals.       Expected Outcomes  Short Term: Increase workloads from initial exercise prescription for resistance, speed, and METs.;Short Term: Perform resistance training exercises routinely  during rehab and add in resistance training at home;Long Term: Improve cardiorespiratory fitness, muscular endurance and strength as measured by increased METs and functional capacity (6MWT)       Able to understand and use rate of perceived exertion (RPE) scale  Yes       Intervention  Provide education and explanation on how to use RPE scale       Expected Outcomes  Short Term: Able to use RPE daily in rehab to express subjective intensity level;Long Term:  Able to use RPE to guide intensity level when exercising independently       Able to understand and use Dyspnea scale  Yes       Intervention  Provide education and explanation on how to use Dyspnea scale       Expected Outcomes  Short Term: Able to use Dyspnea scale daily in rehab to express subjective sense of shortness of breath during exertion;Long Term: Able to use Dyspnea scale to guide intensity  level when exercising independently       Knowledge and understanding of Target Heart Rate Range (THRR)  Yes       Intervention  Provide education and explanation of THRR including how the numbers were predicted and where they are located for reference       Expected Outcomes  Short Term: Able to state/look up THRR;Short Term: Able to use daily as guideline for intensity in rehab;Long Term: Able to use THRR to govern intensity when exercising independently       Able to check pulse independently  Yes       Intervention  Provide education and demonstration on how to check pulse in carotid and radial arteries.;Review the importance of being able to check your own pulse for safety during independent exercise       Expected Outcomes  Short Term: Able to explain why pulse checking is important during independent exercise;Long Term: Able to check pulse independently and accurately       Understanding of Exercise Prescription  Yes       Intervention  Provide education, explanation, and written materials on patient's individual exercise prescription       Expected Outcomes  Short Term: Able to explain program exercise prescription;Long Term: Able to explain home exercise prescription to exercise independently          Nutrition & Weight - Outcomes: Pre Biometrics - 09/10/18 1446      Pre Biometrics   Height  5' 11.9" (1.826 m)    Weight  221 lb 6.4 oz (100.4 kg)    Waist Circumference  40.5 inches    Hip Circumference  39 inches    Waist to Hip Ratio  1.04 %    BMI (Calculated)  30.12    Single Leg Stand  29.53 seconds        Nutrition: Nutrition Therapy & Goals - 09/19/18 1516      Nutrition Therapy   Diet  DM    Drug/Food Interactions  Purine/Gout    Protein (specify units)  12oz    Fiber  35 grams    Whole Grain Foods  3 servings   chooses whole grains regularly   Saturated Fats  16 max. grams    Fruits and Vegetables  8 servings/day    Sodium  1500 grams      Personal Nutrition  Goals   Nutrition Goal  Start to pay more attention to the amount of sodium you are consuming each day  Personal Goal #2  Continue to choose lean protein sources, whole grains, sugar free beverages, high protein and fiber snack options (protein shake + fruit, hummus + carrots), and fruits and vegetables daily- great job for making all these changes!    Personal Goal #3  Now that you've started to implement breakfast daily and eating out less often, work to make these changes perminant lifestyle habits    Comments  Since his heart attack he has gone "all in" on making changes to his diet to improve his health. He no longer eats out often, chooses more whole foods, does not eat fried foods (uses an air fryer), eats breakfast, chooses nutrient-dense snacks, and chooses sugar free beverages. He works with his wife to prepare meals for the week in advance and has reduced his portion sizes. Wt loss of 27# in approx 3 months. He reports to have dramatically improved his HgbA1c level      Intervention Plan   Intervention  Prescribe, educate and counsel regarding individualized specific dietary modifications aiming towards targeted core components such as weight, hypertension, lipid management, diabetes, heart failure and other comorbidities.    Expected Outcomes  Short Term Goal: Understand basic principles of dietary content, such as calories, fat, sodium, cholesterol and nutrients.;Short Term Goal: A plan has been developed with personal nutrition goals set during dietitian appointment.;Long Term Goal: Adherence to prescribed nutrition plan.       Nutrition Discharge: Nutrition Assessments - 10/29/18 0907      MEDFICTS Scores   Pre Score  30    Post Score  12    Score Difference  -18       Education Questionnaire Score: Knowledge Questionnaire Score - 10/29/18 0908      Knowledge Questionnaire Score   Pre Score  22/26    Post Score  24/26   reviewed with pt      Goals reviewed with  patient; copy given to patient.

## 2018-10-29 NOTE — Progress Notes (Signed)
Daily Session Note  Patient Details  Name: John Hartman MRN: [id number] Date of Birth: 08-19-1971 Referring Provider:     Cardiac Rehab from 09/10/2018 in [facility]  Referring Provider  Isaias Cowman MD      Encounter Date: 10/29/2018  Check In: Session Check In - 10/29/18 0848      Check-In   Supervising physician immediately available to respond to emergencies  See telemetry face sheet for immediately available ER MD    Location  [facility]    Staff Present  Alberteen Sam, MA, RCEP, CCRP, Exercise Physiologist;Susanne Bice, RN, BSN, CCRP;Kelly Hayes, BS, ACSM CEP, Exercise Physiologist    Medication changes reported      No    Fall or balance concerns reported     No    Warm-up and Cool-down  Performed on first and last piece of equipment    Resistance Training Performed  Yes    VAD Patient?  No    PAD/SET Patient?  No      Pain Assessment   Currently in Pain?  No/denies          Social History   Tobacco Use  Smoking Status Former Smoker  . Packs/day: 1.00  . Years: 10.00  . Pack years: 10.00  . Types: Cigarettes  . Last attempt to quit: 04/15/2015  . Years since quitting: 3.5  Smokeless Tobacco Never Used    Goals Met:  Independence with exercise equipment Exercise tolerated well No report of cardiac concerns or symptoms Strength training completed today  Goals Unmet:  Not Applicable  Comments: Pt able to follow exercise prescription today without complaint.  Will continue to monitor for progression. [place] Name 09/10/18 1441 10/29/18 0858       6 Minute Walk   Phase  Initial  Discharge    Distance  1380 feet  2000 feet    Distance % Change  -  44.9 %    Distance Feet Change  -  620 ft    Walk Time  6 minutes  6 minutes    # of Rest Breaks  0  0    MPH  2.61  3.78    METS  4.26  5.95    RPE  12  13    Perceived Dyspnea   2  -    VO2 Peak  14.93  20.83    Symptoms  Yes (comment)   Yes (comment)    Comments  fatigue  calves tired    Resting HR  67 bpm  78 bpm    Resting BP  128/54  126/72    Resting Oxygen Saturation   97 %  99 %    Exercise Oxygen Saturation  during 6 min walk  99 %  95 %    Max Ex. HR  91 bpm  128 bpm    Max Ex. BP  138/74  154/78    2 Minute Post BP  126/70  -         Dr. Emily Filbert is Medical Director for [place] [name].

## 2018-10-30 ENCOUNTER — Encounter: Payer: Self-pay | Admitting: Family Medicine

## 2018-10-30 ENCOUNTER — Ambulatory Visit: Payer: 59 | Admitting: Family Medicine

## 2018-10-30 VITALS — BP 122/84 | HR 95 | Temp 98.3°F | Ht 71.0 in | Wt 214.2 lb

## 2018-10-30 DIAGNOSIS — R748 Abnormal levels of other serum enzymes: Secondary | ICD-10-CM

## 2018-10-30 DIAGNOSIS — I10 Essential (primary) hypertension: Secondary | ICD-10-CM

## 2018-10-30 DIAGNOSIS — M51369 Other intervertebral disc degeneration, lumbar region without mention of lumbar back pain or lower extremity pain: Secondary | ICD-10-CM

## 2018-10-30 DIAGNOSIS — I252 Old myocardial infarction: Secondary | ICD-10-CM | POA: Diagnosis not present

## 2018-10-30 DIAGNOSIS — E785 Hyperlipidemia, unspecified: Secondary | ICD-10-CM

## 2018-10-30 DIAGNOSIS — I2510 Atherosclerotic heart disease of native coronary artery without angina pectoris: Secondary | ICD-10-CM | POA: Diagnosis not present

## 2018-10-30 DIAGNOSIS — K589 Irritable bowel syndrome without diarrhea: Secondary | ICD-10-CM

## 2018-10-30 DIAGNOSIS — R7989 Other specified abnormal findings of blood chemistry: Secondary | ICD-10-CM

## 2018-10-30 DIAGNOSIS — M4310 Spondylolisthesis, site unspecified: Secondary | ICD-10-CM

## 2018-10-30 DIAGNOSIS — K760 Fatty (change of) liver, not elsewhere classified: Secondary | ICD-10-CM

## 2018-10-30 DIAGNOSIS — I517 Cardiomegaly: Secondary | ICD-10-CM

## 2018-10-30 DIAGNOSIS — E1165 Type 2 diabetes mellitus with hyperglycemia: Secondary | ICD-10-CM

## 2018-10-30 DIAGNOSIS — M5136 Other intervertebral disc degeneration, lumbar region: Secondary | ICD-10-CM

## 2018-10-30 NOTE — Patient Instructions (Addendum)
We will check labs in a week, and let me know if you come up with anything you'd like checked Try to limit saturated fats in your diet (bologna, hot dogs, barbeque, cheeseburgers, hamburgers, steak, bacon, sausage, cheese, etc.) and get more fresh fruits, vegetables, and whole grains Try to follow the DASH guidelines (DASH stands for Dietary Approaches to Stop Hypertension). Try to limit the sodium in your diet to no more than 1,500mg  of sodium per day. Certainly try to not exceed 2,000 mg per day at the very most. Do not add salt when cooking or at the table.  Check the sodium amount on labels when shopping, and choose items lower in sodium when given a choice. Avoid or limit foods that already contain a lot of sodium. Eat a diet rich in fruits and vegetables and whole grains, and try to lose weight if overweight or obese

## 2018-10-30 NOTE — Progress Notes (Signed)
BP 122/84   Pulse 95   Temp 98.3 F (36.8 C) (Oral)   Ht 5\' 11"  (1.803 m)   Wt 214 lb 3.2 oz (97.2 kg)   SpO2 99%   BMI 29.87 kg/m    Subjective:    Patient ID: John Hartman, male    DOB: 01/25/1971, 47 y.o.   MRN: [id number]  HPI: John Hartman is a 47 y.o. male  Chief Complaint  Patient presents with  . Follow-up    HPI Patient is here for f/u after suffering a heart attack; he was seen by Suezanne Cheshire on Sept 5th He was at cardiac rehab yesterday  He has 4 herniated discs, two vertebra are retrolisthesis 7 mm; surgeon discussed prolonging surgery, trying epidural with Dr. Holley Raring with Monday, August 27th; just to try to calm the nerve down; if it didn't work, was going to have surgery Had the injection Monday morning, then Tuesday evening, started with back spasms, then next hour, had chest pain, not crushing pain; only sx was severe pain across top of chest both sides and pain and numbness in both arms; told Dr. Holley Raring; went to work on Wednesday; tried acupuncture Wednesday; then 15 minutes later, pain came back 10x harder and nauseated; still not thinking heart attack; wife got him to the ER and there troponin was 9.5; Dr. Saralyn Pilar came in and admitted him, stable based on the EKG; was going to do cath the next morning While waiting, around 2 am at that point waiting to be admitted; not sure what happened, started to get much sicker, started to vomit, he stool up to get the trash can and he started swawying and wife caught him to pushed him back on the bed, he started to just start vomiting; he had repeat EKG, another blood test went troponin to 22; then went to the cath lab; 100% blockage  He is convinced he has shredded his right shoulder; last 8-10 weeks, hurts to lift, weight of his arm is significant, can't reach behind and above and across; he will see Dr. Clydene Laming; he is not taking any NSAIDs; no injury  He should be on the blood thinner for 6 months absolute minumum for  emergency surgery; 12 months if preferable; did have rectal bleeding; has hemorrhoids; happened in his sleep; occasional with hemorrhoids, but this was just with waking up and had been bleeding when sleeping; also has occasional red dots on his skin and cardiologist sent him for labs immediately; he had labs done and I reviewed them, normal CBC; brother had a stent and was on [place] and had a ruptured hemorrhoid  Reviewed last lipid panel, HDL 26 and LDL 98 on Sept 5, 2019; on atorvastatin 80 mg daily  For pain, he is now on [place], dissolves and lasts 12 hours; the epidural helped the nerve but still dealing with a lot of back pain; on day to day do nothing that controls the pain; having to go 10 more months before surgery can be done; he has done PT  Wife is concerned because so much has happened; diabetes, cath showed 20% 2 years ago; everyone says it's the diabetes; shoulder issues and back issues and liver enzyme issues, lots of things  Pain in the feet; things are accelerating with disease processes; concern for auto-immune disease or other process; he has never been sickly before a few years ago; started with ddd in the cervical spine; something food sensitivity; he doesn't know what else it could be; gets  a rash that pops up once a year; just the last 3-4 years; goes away after a month or two; itches sometimes; some scratching  SGOT and SGPT last check were normal, 10/17/18  Urine microalbumin jumped up from 4 to 9.7 to 649.9 and back down to 57.1; his A1c on that day was only 7.5; recent A1c was 7.1  Brother have bumps on the head, curved chests, soft venous/lipomatous swelling over the backs of the hands; mother had them checked out   Brother had 99% blockage on his widow maker, never had a heart attack; father had no sx and got cathed and had quadruple bypass; half-sister but not much known, 70 or 26 years old; four years apart to the day with brother, brother is four years older;  brother is a Airline pilot; no diabetes, great shape; he works out and watches what he eats; nonsmoker; double jointed in the left thumb  Also gets headaches, longer, weird headaches, behind the eye, varies, short-lived  He gets angioedema; was put on ACE-I and he was hospitalized for this  Vision has worsened in the last few years; near vision accelerating as well  Reviewed the echocardiogram together; LVEF 35-40%; moderate LVH Cardiologist PA suggested getting another echo a few months after heart attack  Lifelong IBS, much worse over the last few years; vomiting with attacks; went to Dr. Allen Norris  Depression screen [facility] 2/9 11/13/2018 10/30/2018 10/29/2018 09/18/2018 09/10/2018  Decreased Interest 2 0 0 0 0  Down, Depressed, Hopeless 0 0 0 1 0  PHQ - 2 Score 2 0 0 1 0  Altered sleeping 3 0 0 - 0  Tired, decreased energy 3 0 1 - 2  Change in appetite 0 0 0 - 1  Feeling bad or failure about yourself  0 0 0 - 1  Trouble concentrating 0 0 0 - 0  Moving slowly or fidgety/restless 0 0 0 - 0  Suicidal thoughts 0 0 0 - 0  PHQ-9 Score 8 0 1 - 4  Difficult doing work/chores Somewhat difficult Not difficult at all Not difficult at all - Very difficult  Some recent data might be hidden   Fall Risk  11/13/2018 11/13/2018 10/30/2018 09/18/2018 09/10/2018  Falls in the past year? 0 0 0 No No  Comment - - - - -  Number falls in past yr: - - 0 - -  Injury with Fall? - - 0 - -    Relevant past medical, surgical, family and social history reviewed Past Medical History:  Diagnosis Date  . Angioedema   . Bronchitis   . DDD (degenerative disc disease), cervical   . Diabetes mellitus ([place])   . Fatty liver 03/31/2017   [place] April 2018  . GERD (gastroesophageal reflux disease)   . Gout   . Heart attack ([place])   . HTN (hypertension)   . Hypertension    CONTROLLED ON MEDS  . IBS (irritable bowel syndrome)   . LVH (left ventricular hypertrophy) 11/24/2018   Moderate, ECHO  . Myocardial infarction ([place])  08/23/2018  . Seasonal allergies   . Spinal stenosis    Past Surgical History:  Procedure Laterality Date  . ACDF with fusion  2007  . CARDIAC CATHETERIZATION Left 08/15/2016   Procedure: Left Heart Cath and Coronary Angiography;  Surgeon: Wellington Hampshire, MD;  Location: [facility];  Service: Cardiovascular;  Laterality: Left;  . COLONOSCOPY WITH PROPOFOL N/A 06/26/2015   Procedure: COLONOSCOPY WITH PROPOFOL;  Surgeon: Lucilla Lame, MD;  Location: [facility];  Service: Endoscopy;  Laterality: N/A;  with biopsies  . CORONARY/GRAFT ACUTE MI REVASCULARIZATION N/A 08/23/2018   Procedure: Coronary/Graft Acute MI Revascularization;  Surgeon: Isaias Cowman, MD;  Location: [facility];  Service: Cardiovascular;  Laterality: N/A;  . LEFT HEART CATH AND CORONARY ANGIOGRAPHY N/A 08/23/2018   Procedure: LEFT HEART CATH AND CORONARY ANGIOGRAPHY;  Surgeon: Isaias Cowman, MD;  Location: [facility];  Service: Cardiovascular;  Laterality: N/A;  . NASAL SEPTUM SURGERY  2004  . SHOULDER SURGERY Left 2007  . Testicular torsion  1980s  . VASECTOMY     Family History  Problem Relation Age of Onset  . Breast cancer Mother   . Skin cancer Mother   . Arrhythmia Mother        A-fib  . Cancer Mother        breast, colon?, skin  . Hypertension Father   . Diabetes Father   . Cancer Maternal Grandfather   . Heart disease Brother        stent  . Allergies Son   . Alzheimer's disease Maternal Grandmother   . Stroke Neg Hx   . COPD Neg Hx    Social History   Tobacco Use  . Smoking status: Former Smoker    Packs/day: 1.00    Years: 10.00    Pack years: 10.00    Types: Cigarettes    Last attempt to quit: 04/15/2015    Years since quitting: 3.6  . Smokeless tobacco: Never Used  Substance Use Topics  . Alcohol use: No    Alcohol/week: 0.0 standard drinks  . Drug use: No     [facility] Visit from 10/30/2018 [facility]  AUDIT-C Score  0       Interim medical history since last visit reviewed. Allergies and medications reviewed  Review of Systems Per HPI unless specifically indicated above     Objective:    BP 122/84   Pulse 95   Temp 98.3 F (36.8 C) (Oral)   Ht 5\' 11"  (1.803 m)   Wt 214 lb 3.2 oz (97.2 kg)   SpO2 99%   BMI 29.87 kg/m   Wt Readings from Last 3 Encounters:  11/13/18 212 lb (96.2 kg)  10/30/18 214 lb 3.2 oz (97.2 kg)  10/17/18 218 lb (98.9 kg)    Physical Exam  Constitutional: He appears well-developed and well-nourished. No distress.  HENT:  Head: Normocephalic and atraumatic.  Eyes: EOM are normal. No scleral icterus.  Neck: No thyromegaly present.  Cardiovascular: Normal rate and regular rhythm.  Pulmonary/Chest: Effort normal and breath sounds normal. He exhibits deformity (forward rounding of the anterior chest wall, not to the extent of pectus carinatum though).  Abdominal: Soft. Bowel sounds are normal. He exhibits no distension.  Musculoskeletal: He exhibits no edema.       Hands: Soft swelling over the dorsa of both hands; symmetric; oval shaped  Neurological: Coordination normal.  Skin: Skin is warm and dry. No pallor.  Psychiatric: He has a normal mood and affect. His behavior is normal. Judgment and thought content normal.      Assessment & Plan:   Problem List Items Addressed This Visit      Cardiovascular and Mediastinum   LVH (left ventricular hypertrophy)    LVEF 35-40% on last echo with moderate LVH; note to cardiologist to see about getting another echo now that he is a few months status post MI  Essential hypertension    Controlled today; continue regimen      Coronary artery disease - Primary    Continue plan per cardiologist; work to control lipids, A1c, weight, BP        Digestive   IBS (irritable bowel syndrome)    Lifelong bouts      Fatty liver    Monitor LFTs        Endocrine   Uncontrolled diabetes mellitus with hyperglycemia (HCC)      Last A1c was 7.1, almost to goal; managed by endocrinologist; foot exam by MD today        Musculoskeletal and Integument   Retrolisthesis of vertebrae    Seeing pain clinic physician      Degenerative disc disease, lumbar    Managed by pain clinic physician        Other   Status post myocardial infarction    Goal LDL less than 70; he has not had diabetes for so long that I would think that the DM is the sole causative factor of his heart disease; will do some additional testing, homocysteine, more in-depth lipid analysis; continue statin, meds per cardiologist      Hyperlipidemia LDL goal <70    Goal LDL less than 70 (less than 55 per EAS/ESC new guidelines); will get more in-depth lipid panel; continue statin; healthy eating, activity, weight management      Elevated liver enzymes    Last liver enzymes were normal; will recheck       Other Visit Diagnoses    Elevated ferritin       check level   Relevant Orders   Ferritin   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 Sanda Klein.  An after-visit summary was printed and given to the patient at [place].  Please see the patient instructions which may contain other information and recommendations beyond what is mentioned above in the assessment and plan.  No orders of the defined types were placed in this encounter.   Orders Placed This Encounter  Procedures  . Ferritin  . 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 Kathyrn Sheriff is, [facility] is okay or the other labs  Face-to-face time with patient was more than 40 minutes, >50% time spent counseling and coordination of care

## 2018-10-31 ENCOUNTER — Encounter: Payer: Self-pay | Admitting: *Deleted

## 2018-10-31 DIAGNOSIS — Z955 Presence of coronary angioplasty implant and graft: Secondary | ICD-10-CM | POA: Diagnosis not present

## 2018-10-31 NOTE — Progress Notes (Signed)
Daily Session Note  Patient Details  Name: John Hartman MRN: [id number] Date of Birth: 1971/05/30 Referring Provider:     Cardiac Rehab from 09/10/2018 in [facility]  Referring Provider  John Cowman MD      Encounter Date: 10/31/2018  Check In: Session Check In - 10/31/18 0731      Check-In   Supervising physician immediately available to respond to emergencies  See telemetry face sheet for immediately available ER MD    Location  [facility]-Cardiac & Pulmonary Rehab    Staff Present  Alberteen Sam, MA, RCEP, CCRP, Exercise Physiologist;Joseph [place] [employer];Heath Lark, RN, BSN, CCRP    Medication changes reported      No    Fall or balance concerns reported     No    Warm-up and Cool-down  Performed on first and last piece of equipment    Resistance Training Performed  Yes    VAD Patient?  No    PAD/SET Patient?  No      Pain Assessment   Currently in Pain?  No/denies          Social History   Tobacco Use  Smoking Status Former Smoker  . Packs/day: 1.00  . Years: 10.00  . Pack years: 10.00  . Types: Cigarettes  . Last attempt to quit: 04/15/2015  . Years since quitting: 3.5  Smokeless Tobacco Never Used    Goals Met:  Independence with exercise equipment Exercise tolerated well No report of cardiac concerns or symptoms Strength training completed today  Goals Unmet:  Not Applicable  Comments:  John Hartman graduated today from  rehab with 36 sessions completed.  Details of the patient's exercise prescription and what He needs to do in order to continue the prescription and progress were discussed with patient.  Patient was given a copy of prescription and goals.  Patient verbalized understanding.  John Hartman plans to continue to exercise by walking and biking at home.  Pt able to follow exercise prescription today without complaint.  Will continue to monitor for progression.    Dr. Emily Hartman is Medical Director for [name].

## 2018-10-31 NOTE — Progress Notes (Signed)
Cardiac Individual Treatment Plan  Patient Details  Name: John Hartman MRN: [id number] Date of Birth: 10/28/1971 Referring Provider:     Cardiac Rehab from 09/10/2018 in [facility]  Referring Provider  Paraschos, Alexander MD      Initial Encounter Date:    Cardiac Rehab from 09/10/2018 in [facility]  Date  09/10/18      Visit Diagnosis: Status post coronary artery stent placement  Patient's Home Medications on Admission:  Current Outpatient Medications:  .  ACCU-CHEK SOFTCLIX LANCETS lancets, USE AS DIRECTED, Disp: 100 each, Rfl: 0 .  allopurinol (ZYLOPRIM) 100 MG tablet, Take 100 mg by mouth every morning. , Disp: , Rfl:  .  aspirin EC 81 MG tablet, Take 81 mg by mouth daily., Disp: , Rfl:  .  atorvastatin (LIPITOR) 80 MG tablet, Take 1 tablet (80 mg total) by mouth daily at 6 PM., Disp: 30 tablet, Rfl: 0 .  bell[place]ona alk-PHENObarbital (DONNATAL) 16.2 MG tablet, Take 1 tablet by mouth as needed (pain)., Disp: , Rfl:  .  Buprenorphine HCl (BELBUCA) 150 MCG FILM, Place 150 mcg inside cheek every 12 (twelve) hours., Disp: 60 each, Rfl: 1 .  cetirizine (ZYRTEC) 10 MG tablet, Take 10 mg by mouth at bedtime. , Disp: , Rfl:  .  colchicine 0.6 MG tablet, Take 0.6 mg by mouth daily as needed (gout). , Disp: , Rfl:  .  Dapagliflozin-metFORMIN HCl ER (XIGDUO XR) 04-999 MG TB24, Take 2 tablets by mouth daily. , Disp: , Rfl:  .  diphenhydrAMINE (BENADRYL) 25 mg capsule, Take 50 mg by mouth at bedtime. , Disp: , Rfl:  .  EPINEPHrine (EPIPEN 2-PAK) 0.3 mg/0.3 mL IJ SOAJ injection, Inject 0.3 mg into the muscle once., Disp: , Rfl:  .  glucose blood (ACCU-CHEK ACTIVE STRIPS) test strip, Use as instructed, Disp: 100 each, Rfl: 12 .  glucose blood (ACCU-CHEK AVIVA PLUS) test strip, Check FSBS three to four times per day, Disp: 100 each, Rfl: 1 .  hydrALAZINE (APRESOLINE) 25 MG tablet, Take 1 tablet (25 mg total) by mouth 2 (two) times daily., Disp: 60 tablet,  Rfl: 0 .  hydrochlorothiazide (HYDRODIURIL) 12.5 MG tablet, Take 12.5 mg by mouth daily., Disp: , Rfl:  .  indomethacin (INDOCIN) 25 MG capsule, Take 25 mg by mouth 2 (two) times daily as needed for mild pain., Disp: , Rfl:  .  Insulin Degludec (TRESIBA FLEXTOUCH) 100 UNIT/ML SOPN, Inject 34 Units into the skin at bedtime. , Disp: 1 pen, Rfl: 0 .  Insulin Pen Needle (B-D UF III MINI PEN NEEDLES) 31G X 5 MM MISC, Once a day with insulin, Disp: 50 each, Rfl: 3 .  Melatonin 5 MG TABS, Take 5 mg by mouth at bedtime. , Disp: , Rfl:  .  metoprolol tartrate (LOPRESSOR) 50 MG tablet, Take 1 tablet (50 mg total) by mouth 2 (two) times daily. (Patient taking differently: Take 25 mg by mouth 2 (two) times daily. ), Disp: 60 tablet, Rfl: 0 .  nitroGLYCERIN (NITROSTAT) 0.4 MG SL tablet, Place 1 tablet (0.4 mg total) under the tongue every 5 (five) minutes as needed for chest pain., Disp: 30 tablet, Rfl: 0 .  oxyCODONE-acetaminophen (PERCOCET) 7.5-325 MG tablet, Take 1 tablet by mouth 3 (three) times daily as needed for severe pain., Disp: 90 tablet, Rfl: 0 .  pregabalin (LYRICA) 50 MG capsule, Take 1 capsule (50 mg total) by mouth 3 (three) times daily., Disp: 90 capsule, Rfl: 2 .    ranitidine (ZANTAC) 150 MG capsule, Take 150 mg by mouth at bedtime. , Disp: , Rfl:  .  ticagrelor (BRILINTA) 90 MG TABS tablet, Take 90 mg by mouth 2 (two) times daily., Disp: , Rfl:  .  tiZANidine (ZANAFLEX) 4 MG tablet, Take 1 tablet (4 mg total) by mouth every 8 (eight) hours as needed for muscle spasms., Disp: 90 tablet, Rfl: 5  Past Medical History: Past Medical History:  Diagnosis Date  . Angioedema   . Bronchitis   . DDD (degenerative disc disease), cervical   . Diabetes mellitus (HCC)   . Fatty liver 03/31/2017   US April 2018  . GERD (gastroesophageal reflux disease)   . Gout   . Heart attack (HCC)   . HTN (hypertension)   . Hypertension    CONTROLLED ON MEDS  . IBS (irritable bowel syndrome)   . Myocardial  infarction (HCC) 08/23/2018  . Seasonal allergies   . Spinal stenosis     Tobacco Use: Social History   Tobacco Use  Smoking Status Former Smoker  . Packs/day: 1.00  . Years: 10.00  . Pack years: 10.00  . Types: Cigarettes  . Last attempt to quit: 04/15/2015  . Years since quitting: 3.5  Smokeless Tobacco Never Used    Labs: Recent Review Flowsheet Data    Labs for [facility]P Cardiac and Pulmonary Rehab Latest Ref Rng & Units 12/28/2016 02/10/2017 08/04/2017 08/10/2018 08/30/2018   Cholestrol <200 mg/dL 135 123 140 - 147   LDLCALC mg/dL (calc) 86 68 88 - 98   HDL >40 mg/dL 30(L) 29(L) 34(L) - 26(L)   Trlycerides <150 mg/dL 96 132 90 - 133   Hemoglobin A1c 4.0 - 6.0 - - - 7.1(A) -       Exercise Target Goals: Exercise Program Goal: Individual exercise prescription set using results from initial 6 min walk test and THRR while considering  patient's activity barriers and safety.   Exercise Prescription Goal: Initial exercise prescription builds to 30-45 minutes a day of aerobic activity, 2-3 days per week.  Home exercise guidelines will be given to patient during program as part of exercise prescription that the participant will acknowledge.  Activity Barriers & Risk Stratification: Activity Barriers & Cardiac Risk Stratification - 09/10/18 1442      Activity Barriers & Cardiac Risk Stratification   Activity Barriers  Back Problems;Neck/Spine Problems;Deconditioning;Muscular Weakness;Other (comment);Arthritis    Comments  chronic back pain, needs back surgery for 4 herniated disc and 2 dislocated, DJD, being seen by pain clinic, mobility limited by pain level on some days    Cardiac Risk Stratification  Moderate       6 Minute Walk: 6 Minute Walk    Row Name 09/10/18 1441 10/29/18 0858       6 Minute Walk   Phase  Initial  Discharge    Distance  1380 feet  2000 feet    Distance % Change  -  44.9 %    Distance Feet Change  -  620 ft    Walk Time  6 minutes  6 minutes    # of  Rest Breaks  0  0    MPH  2.61  3.78    METS  4.26  5.95    RPE  12  13    Perceived Dyspnea   2  -    VO2 Peak  14.93  20.83    Symptoms  Yes (comment)  Yes (comment)    Comments  fatigue  calves   tired    Resting HR  67 bpm  78 bpm    Resting BP  128/54  126/72    Resting Oxygen Saturation   97 %  99 %    Exercise Oxygen Saturation  during 6 min walk  99 %  95 %    Max Ex. HR  91 bpm  128 bpm    Max Ex. BP  138/74  154/78    2 Minute Post BP  126/70  -       Oxygen Initial Assessment:   Oxygen Re-Evaluation:   Oxygen Discharge (Final Oxygen Re-Evaluation):   Initial Exercise Prescription: Initial Exercise Prescription - 09/10/18 1400      Date of Initial Exercise RX and Referring Provider   Date  09/10/18    Referring Provider  Paraschos, Alexander MD      Treadmill   MPH  2.5    Grade  2    Minutes  15    METs  3.6      Recumbant Bike   Level  4    RPM  60    Watts  72    Minutes  15    METs  3      T5 Nustep   Level  4    SPM  80    Minutes  15    METs  3      Prescription Details   Frequency (times per week)  3    Duration  Progress to 45 minutes of aerobic exercise without signs/symptoms of physical distress      Intensity   THRR 40-80% of Max Heartrate  110-153    Ratings of Perceived Exertion  11-13    Perceived Dyspnea  0-4      Progression   Progression  Continue to progress workloads to maintain intensity without signs/symptoms of physical distress.      Resistance Training   Training Prescription  Yes    Weight  4 lbs    Reps  10-15       Perform Capillary Blood Glucose checks as needed.  Exercise Prescription Changes: Exercise Prescription Changes    Row Name 09/10/18 1400 09/19/18 0900 09/24/18 1400 10/10/18 1000 10/23/18 1500     Response to Exercise   Blood Pressure (Admit)  128/54  -  126/72  104/66  120/80   Blood Pressure (Exercise)  138/74  -  136/62  142/84  188/94   Blood Pressure (Exit)  126/70  -  108/60  104/64   130/82   Heart Rate (Admit)  67 bpm  -  86 bpm  81 bpm  60 bpm   Heart Rate (Exercise)  91 bpm  -  128 bpm  143 bpm  158 bpm   Heart Rate (Exit)  75 bpm  -  114 bpm  107 bpm  113 bpm   Oxygen Saturation (Admit)  97 %  -  -  -  -   Oxygen Saturation (Exercise)  94 %  -  -  -  -   Rating of Perceived Exertion (Exercise)  12  -  14  14  13   Perceived Dyspnea (Exercise)  2  -  -  -  -   Symptoms  fatigue  -  occasional back pain  none  none   Comments  walk test results  -  -  -  -   Duration  -  -  Continue with 45 min of   aerobic exercise without signs/symptoms of physical distress.  Continue with 45 min of aerobic exercise without signs/symptoms of physical distress.  Continue with 45 min of aerobic exercise without signs/symptoms of physical distress.   Intensity  -  -  THRR unchanged  THRR unchanged  THRR unchanged     Progression   Progression  -  -  Continue to progress workloads to maintain intensity without signs/symptoms of physical distress.  Continue to progress workloads to maintain intensity without signs/symptoms of physical distress.  Continue to progress workloads to maintain intensity without signs/symptoms of physical distress.   Average METs  -  -  3.84  3.83  4.5     Resistance Training   Training Prescription  -  -  Yes  -  Yes   Weight  -  -  7 lbs  7 lb  7 lb   Reps  -  -  10-15  10-15  10-15     Interval Training   Interval Training  -  -  No  No  No     Treadmill   MPH  -  -  3  -  3.3   Grade  -  -  2  -  2.5   Minutes  -  -  15  -  15   METs  -  -  4.12  -  4.66     Recumbant Bike   Level  -  -  _0 RPM  -  -  -  60  60   Watts  -  -  -  72  -   Minutes  -  -  _1 METs  -  -  4.4  4.4  6     T5 Nustep   Level  -  -  _2 Minutes  -  -  _3 METs  -  -  3  3  2.6     Home Exercise Plan   Plans to continue exercise at  -  Home (comment) walking, weights, recumbent bike  Home (comment) walking, weights, recumbent bike  Home  (comment) walking, weights, recumbent bike  -   Frequency  -  Add 2 additional days to program exercise sessions.  Add 2 additional days to program exercise sessions.  Add 2 additional days to program exercise sessions.  -   Initial Home Exercises Provided  -  09/19/18  09/19/18  09/19/18  -      Exercise Comments: Exercise Comments    Row Name 09/12/18 0808 10/11/18 1640         Exercise Comments  First full day of exercise!  Patient was oriented to gym and equipment including functions, settings, policies, and procedures.  Patient's individual exercise prescription and treatment plan were reviewed.  All starting workloads were established based on the results of the 6 minute walk test done at initial orientation visit.  The plan for exercise progression was also introduced and progression will be customized based on patient's performance and goals.  Added interval training - pt voiced understanding         Exercise Goals and Review: Exercise Goals    Row Name 09/10/18 1445             Exercise Goals   Increase Physical Activity  Yes  Intervention  Provide advice, education, support and counseling about physical activity/exercise needs.;Develop an individualized exercise prescription for aerobic and resistive training based on initial evaluation findings, risk stratification, comorbidities and participant's personal goals.       Expected Outcomes  Short Term: Attend rehab on a regular basis to increase amount of physical activity.;Long Term: Add in home exercise to make exercise part of routine and to increase amount of physical activity.;Long Term: Exercising regularly at least 3-5 days a week.       Increase Strength and Stamina  Yes       Intervention  Provide advice, education, support and counseling about physical activity/exercise needs.;Develop an individualized exercise prescription for aerobic and resistive training based on initial evaluation findings, risk  stratification, comorbidities and participant's personal goals.       Expected Outcomes  Short Term: Increase workloads from initial exercise prescription for resistance, speed, and METs.;Short Term: Perform resistance training exercises routinely during rehab and add in resistance training at home;Long Term: Improve cardiorespiratory fitness, muscular endurance and strength as measured by increased METs and functional capacity (6MWT)       Able to understand and use rate of perceived exertion (RPE) scale  Yes       Intervention  Provide education and explanation on how to use RPE scale       Expected Outcomes  Short Term: Able to use RPE daily in rehab to express subjective intensity level;Long Term:  Able to use RPE to guide intensity level when exercising independently       Able to understand and use Dyspnea scale  Yes       Intervention  Provide education and explanation on how to use Dyspnea scale       Expected Outcomes  Short Term: Able to use Dyspnea scale daily in rehab to express subjective sense of shortness of breath during exertion;Long Term: Able to use Dyspnea scale to guide intensity level when exercising independently       Knowledge and understanding of Target Heart Rate Range (THRR)  Yes       Intervention  Provide education and explanation of THRR including how the numbers were predicted and where they are located for reference       Expected Outcomes  Short Term: Able to state/look up THRR;Short Term: Able to use daily as guideline for intensity in rehab;Long Term: Able to use THRR to govern intensity when exercising independently       Able to check pulse independently  Yes       Intervention  Provide education and demonstration on how to check pulse in carotid and radial arteries.;Review the importance of being able to check your own pulse for safety during independent exercise       Expected Outcomes  Short Term: Able to explain why pulse checking is important during independent  exercise;Long Term: Able to check pulse independently and accurately       Understanding of Exercise Prescription  Yes       Intervention  Provide education, explanation, and written materials on patient's individual exercise prescription       Expected Outcomes  Short Term: Able to explain program exercise prescription;Long Term: Able to explain home exercise prescription to exercise independently          Exercise Goals Re-Evaluation : Exercise Goals Re-Evaluation    Row Name 09/12/18 9357 09/19/18 0908 09/24/18 1420 10/10/18 1102 10/23/18 1519     Exercise Goal Re-Evaluation   Exercise Goals  Review  Knowledge and understanding of Target Heart Rate Range (THRR);Able to understand and use rate of perceived exertion (RPE) scale;Understanding of Exercise Prescription  Knowledge and understanding of Target Heart Rate Range (THRR);Able to understand and use rate of perceived exertion (RPE) scale;Understanding of Exercise Prescription;Increase Physical Activity;Able to check pulse independently;Increase Strength and Stamina  Understanding of Exercise Prescription;Increase Physical Activity;Increase Strength and Stamina  Increase Physical Activity;Increase Strength and Stamina;Able to understand and use rate of perceived exertion (RPE) scale;Knowledge and understanding of Target Heart Rate Range (THRR);Able to check pulse independently;Understanding of Exercise Prescription  Increase Physical Activity;Increase Strength and Stamina;Able to understand and use rate of perceived exertion (RPE) scale;Able to understand and use Dyspnea scale;Knowledge and understanding of Target Heart Rate Range (THRR);Able to check pulse independently;Understanding of Exercise Prescription   Comments  Reviewed RPE scale, THR and program prescription with pt today.  Pt voiced understanding and was given a copy of goals to take home.   Reviewed home exercise with pt today.  Pt plans to walking and handweights for exercise.  [name]  is also getting a recumbent bike once he moves to the new house.  Reviewed THR, pulse, RPE, sign and symptoms, NTG use, and when to call 911 or MD.  Also discussed weather considerations and indoor options.  Pt voiced understanding.  [name] is off to a good start in rehab.  He would like to try intervals on his seated equipment as his back is his biggest limitation.  He has already increased to 3.0 mph on the treadmill.  We will continue to monitor his progression.   [name] has had improvement in overall METS.  Staff will discuss adding  Interval training to his program.    [name] has increased MET level since adding interval training.  He tolerates intervals well.   Expected Outcomes  Short: Use RPE daily to regulate intensity. Long: Follow program prescription in THR.  Short: Start to add in exercise more at home.  Long: Start to increase activity levels more at home.   Short: Start intervals.  Long: Continue to increase strength and stamina.   Short - add interval training Long - increase oveall METs  Short - continue with interval training 1-2 days per week Long - maintain fitness on his own      Discharge Exercise Prescription (Final Exercise Prescription Changes): Exercise Prescription Changes - 10/23/18 1500      Response to Exercise   Blood Pressure (Admit)  120/80    Blood Pressure (Exercise)  188/94    Blood Pressure (Exit)  130/82    Heart Rate (Admit)  60 bpm    Heart Rate (Exercise)  158 bpm    Heart Rate (Exit)  113 bpm    Rating of Perceived Exertion (Exercise)  13    Symptoms  none    Duration  Continue with 45 min of aerobic exercise without signs/symptoms of physical distress.    Intensity  THRR unchanged      Progression   Progression  Continue to progress workloads to maintain intensity without signs/symptoms of physical distress.    Average METs  4.5      Resistance Training   Training Prescription  Yes    Weight  7 lb    Reps  10-15      Interval Training   Interval  Training  No      Treadmill   MPH  3.3    Grade  2.5    Minutes  15  METs  4.66      Recumbant Bike   Level  4    RPM  60    Minutes  15    METs  6      T5 Nustep   Level  6    Minutes  15    METs  2.6       Nutrition:  Target Goals: Understanding of nutrition guidelines, daily intake of sodium <1557m, cholesterol <2014m calories 30% from fat and 7% or less from saturated fats, daily to have 5 or more servings of fruits and vegetables.  Biometrics: Pre Biometrics - 09/10/18 1446      Pre Biometrics   Height  5' 11.9" (1.826 m)    Weight  221 lb 6.4 oz (100.4 kg)    Waist Circumference  40.5 inches    Hip Circumference  39 inches    Waist to Hip Ratio  1.04 %    BMI (Calculated)  30.12    Single Leg Stand  29.53 seconds        Nutrition Therapy Plan and Nutrition Goals: Nutrition Therapy & Goals - 09/19/18 1516      Nutrition Therapy   Diet  DM    Drug/Food Interactions  Purine/Gout    Protein (specify units)  12oz    Fiber  35 grams    Whole Grain Foods  3 servings   chooses whole grains regularly   Saturated Fats  16 max. grams    Fruits and Vegetables  8 servings/day    Sodium  1500 grams      Personal Nutrition Goals   Nutrition Goal  Start to pay more attention to the amount of sodium you are consuming each day    Personal Goal #2  Continue to choose lean protein sources, whole grains, sugar free beverages, high protein and fiber snack options (protein shake + fruit, hummus + carrots), and fruits and vegetables daily- great job for making all these changes!    Personal Goal #3  Now that you've started to implement breakfast daily and eating out less often, work to make these changes perminant lifestyle habits    Comments  Since his heart attack he has gone "all in" on making changes to his diet to improve his health. He no longer eats out often, chooses more whole foods, does not eat fried foods (uses an air fryer), eats breakfast, chooses  nutrient-dense snacks, and chooses sugar free beverages. He works with his wife to prepare meals for the week in advance and has reduced his portion sizes. Wt loss of 27# in approx 3 months. He reports to have dramatically improved his HgbA1c level      Intervention Plan   Intervention  Prescribe, educate and counsel regarding individualized specific dietary modifications aiming towards targeted core components such as weight, hypertension, lipid management, diabetes, heart failure and other comorbidities.    Expected Outcomes  Short Term Goal: Understand basic principles of dietary content, such as calories, fat, sodium, cholesterol and nutrients.;Short Term Goal: A plan has been developed with personal nutrition goals set during dietitian appointment.;Long Term Goal: Adherence to prescribed nutrition plan.       Nutrition Assessments: Nutrition Assessments - 10/29/18 0907      MEDFICTS Scores   Pre Score  30    Post Score  12    Score Difference  -18       Nutrition Goals Re-Evaluation: Nutrition Goals Re-Evaluation    Row Name 09/19/18 1527 10/15/18 1629  Goals   Nutrition Goal  Continue to choose lean protein sources, whole grains, sugar free beverages, high protein and fiber snack options (protein shake + fruit, hummus + carrots), and fruits and vegetables daily- great job for making all these changes!  Start to pay more attention to daily sodium intake; continue to work on making recent lifestyle changes habits and adopt them as perminant practices IE eating out less, eating breakfast more often, eating whole grains / fruit/ vegetables more often, choosing high fiber and protein snack options, eating lean cuts of meat, and choosing sugar free beverages      Comment  Since his heart attack he has completely changed his approach to eating and has improved overall quality. He has been able to lose weight and improve labs such as his A1c  He admits that the initial scare of the  heart attack has worn off a bit and he has caught himself eating some less nutritious snack foods that his wife buys for their son, but he continues to work to maintain the majority of healthy lifestyle practices. They are moving into a new house next week which has an updated kitchen, and he is excited to get to cook more meals in this new space. His wife continues to have fresh cooked vegetables available throughout the week and he chooses whole-food based snacks like apples, cottage cheese, hummus and carrots regularly. He is maintaining his weight s/p a period of wt loss which was desired. He has not started to pay more attention to sodium but has continued to eat out significantly less and he does not add salt to food other than the occasional potato, so he doesn't feel that it is a concern at this time      Expected Outcome  He will adopt new diet practices as perminant lifestyle changes and continue to improve his health outcomes  He will maintain lifestyle changes that were started at the beginning of our program and adopt them perminantly        Personal Goal #2 Re-Evaluation   Personal Goal #2  Start to pay more attention to the amount of sodium you are consuming each day  -        Personal Goal #3 Re-Evaluation   Personal Goal #3  Now that you've started to implement breakfast daily and eating out less often, work to make these changes perminant lifestyle habits  -         Nutrition Goals Discharge (Final Nutrition Goals Re-Evaluation): Nutrition Goals Re-Evaluation - 10/15/18 1629      Goals   Nutrition Goal  Start to pay more attention to daily sodium intake; continue to work on making recent lifestyle changes habits and adopt them as perminant practices IE eating out less, eating breakfast more often, eating whole grains / fruit/ vegetables more often, choosing high fiber and protein snack options, eating lean cuts of meat, and choosing sugar free beverages    Comment  He admits that  the initial scare of the heart attack has worn off a bit and he has caught himself eating some less nutritious snack foods that his wife buys for their son, but he continues to work to maintain the majority of healthy lifestyle practices. They are moving into a new house next week which has an updated kitchen, and he is excited to get to cook more meals in this new space. His wife continues to have fresh cooked vegetables available throughout the week and he chooses whole-food  based snacks like apples, cottage cheese, hummus and carrots regularly. He is maintaining his weight s/p a period of wt loss which was desired. He has not started to pay more attention to sodium but has continued to eat out significantly less and he does not add salt to food other than the occasional potato, so he doesn't feel that it is a concern at this time    Expected Outcome  He will maintain lifestyle changes that were started at the beginning of our program and adopt them perminantly       Psychosocial: Target Goals: Acknowledge presence or absence of significant depression and/or stress, maximize coping skills, provide positive support system. Participant is able to verbalize types and ability to use techniques and skills needed for reducing stress and depression.   Initial Review & Psychosocial Screening: Initial Psych Review & Screening - 09/10/18 1423      Initial Review   Current issues with  Current Stress Concerns    Source of Stress Concerns  Chronic Illness;Unable to participate in former interests or hobbies;Unable to perform yard/household activities    Comments  [name] has a lot of issues with his back. He was actually in the process of being cleared for back surgery when he had his heart attack. His back hinders him from doing a lot that he would want to do.       Family Dynamics   Good Support System?  Yes   wife     Barriers   Psychosocial barriers to participate in program  The patient should benefit  from training in stress management and relaxation.;There are no identifiable barriers or psychosocial needs.      Screening Interventions   Interventions  Encouraged to exercise;Program counselor consult;To provide support and resources with identified psychosocial needs;Provide feedback about the scores to participant    Expected Outcomes  Short Term goal: Utilizing psychosocial counselor, staff and physician to assist with identification of specific Stressors or current issues interfering with healing process. Setting desired goal for each stressor or current issue identified.;Long Term Goal: Stressors or current issues are controlled or eliminated.;Short Term goal: Identification and review with participant of any Quality of Life or Depression concerns found by scoring the questionnaire.;Long Term goal: The participant improves quality of Life and PHQ9 Scores as seen by post scores and/or verbalization of changes       Quality of Life Scores:  Quality of Life - 10/29/18 0908      Quality of Life   Select  Quality of Life      Quality of Life Scores   Health/Function Pre  15.5 %    Health/Function Post  20.7 %    Health/Function % Change  33.55 %    Socioeconomic Pre  23.36 %    Socioeconomic Post  23 %    Socioeconomic % Change   -1.54 %    Psych/Spiritual Pre  16.5 %    Psych/Spiritual Post  21.71 %    Psych/Spiritual % Change  31.58 %    Family Pre  28.8 %    Family Post  26.4 %    Family % Change  -8.33 %    GLOBAL Pre  19.28 %    GLOBAL Post  22.22 %    GLOBAL % Change  15.25 %      Scores of 19 and below usually indicate a poorer quality of life in these areas.  A difference of  2-3 points is a clinically meaningful  difference.  A difference of 2-3 points in the total score of the Quality of Life Index has been associated with significant improvement in overall quality of life, self-image, physical symptoms, and general health in studies assessing change in quality of  life.  PHQ-9: Recent Review Flowsheet Data    Depression screen [facility] 2/9 10/30/2018 10/29/2018 09/18/2018 09/10/2018 08/09/2018   Decreased Interest 0 0 0 0 0   Down, Depressed, Hopeless 0 0 1 0 0   PHQ - 2 Score 0 0 1 0 0   Altered sleeping 0 0 - 0 -   Tired, decreased energy 0 1 - 2 -   Change in appetite 0 0 - 1 -   Feeling bad or failure about yourself  0 0 - 1 -   Trouble concentrating 0 0 - 0 -   Moving slowly or fidgety/restless 0 0 - 0 -   Suicidal thoughts 0 0 - 0 -   PHQ-9 Score 0 1 - 4 -   Difficult doing work/chores Not difficult at all Not difficult at all - Very difficult -     Interpretation of Total Score  Total Score Depression Severity:  1-4 = Minimal depression, 5-9 = Mild depression, 10-14 = Moderate depression, 15-19 = Moderately severe depression, 20-27 = Severe depression   Psychosocial Evaluation and Intervention: Psychosocial Evaluation - 09/12/18 0939      Psychosocial Evaluation & Interventions   Interventions  Relaxation education;Stress management education;Encouraged to exercise with the program and follow exercise prescription    Comments  Counselor met with Mr. Olivera [name]) today for initial psychosocial evaluation.  He is a 47 year old who had a heart attack and stent inserted on 8/29.  He has a strong support system with a spouse of 65 years; parents and brother in [place]; and active involvement in his local church community.  [name] has multiple health issues with diabetes; chronic back pain awaiting surgery; and a recent diagnosis of sleep apnea in addition to his heart condition.  [name] has a history of sleep problems but reports since the stent is sleeping much better.  His appetite has improved and he has lost weight approx 30 pounds (as recommended) over the past few months.  [name] denies a history of depression or anxiety or any current symptoms, although he admits to some mood symptoms due to his chronic back pain.  He reports his mood is typically  positive in spite of all of this.  He has multiple stressors with building a house; his back pain and the limitations that presents.  [name] has goals to improve his heart health; get back to work on 9/30 and be able to do more normal activities.  He will be followed with staff.      Expected Outcomes  Short:  [name] will exercise consistently to improve his heart health and as a positive coping strategy for his stress.  Long:  [name] will develop a positive self-care routine for his health and his mental health.      Continue Psychosocial Services   Follow up required by staff       Psychosocial Re-Evaluation: Psychosocial Re-Evaluation    [place] Name 10/15/18 9390 10/24/18 1716           Psychosocial Re-Evaluation   Current issues with  Current Stress Concerns  Current Stress Concerns      Comments  [name] and his family are moving to a new house soon. This is an exciting change for them.  Counselor follow up with [name] today reporting enjoying this program and the health benefits of feeling stronger and reducing stress.  He and his wife close on a house tomorrow and he is moving over the next week.  He has taken time off from work to allocate to a smoother transition.  Counselor commended him for being pro-active about his health and his mental health and for his commitment to exercise consistently.        Expected Outcomes  Short - [name] will continue to feel positive about the new home [place] will manage stress with exercise and techniques learned in HT  Short:  [name] will exercise for his health and mental health benefits - especially stress management.  Long:  [name] will maintain a positive plan of exercise and diet for his overall health and well-being.        Continue Psychosocial Services   -  Follow up required by staff         Psychosocial Discharge (Final Psychosocial Re-Evaluation): Psychosocial Re-Evaluation - 10/24/18 1716      Psychosocial Re-Evaluation   Current issues  with  Current Stress Concerns    Comments  Counselor follow up with [name] today reporting enjoying this program and the health benefits of feeling stronger and reducing stress.  He and his wife close on a house tomorrow and he is moving over the next week.  He has taken time off from work to allocate to a smoother transition.  Counselor commended him for being pro-active about his health and his mental health and for his commitment to exercise consistently.      Expected Outcomes  Short:  [name] will exercise for his health and mental health benefits - especially stress management.  Long:  [name] will maintain a positive plan of exercise and diet for his overall health and well-being.      Continue Psychosocial Services   Follow up required by staff       Vocational Rehabilitation: Provide vocational rehab assistance to qualifying candidates.   Vocational Rehab Evaluation & Intervention: Vocational Rehab - 09/10/18 1423      Initial Vocational Rehab Evaluation & Intervention   Assessment shows need for Vocational Rehabilitation  No       Education: Education Goals: Education classes will be provided on a variety of topics geared toward better understanding of heart health and risk factor modification. Participant will state understanding/return demonstration of topics presented as noted by education test scores.  Learning Barriers/Preferences: Learning Barriers/Preferences - 09/10/18 1419      Learning Barriers/Preferences   Learning Barriers  None    Learning Preferences  None       Education Topics:  AED/CPR: - Group verbal and written instruction with the use of models to demonstrate the basic use of the AED with the basic ABC's of resuscitation.   Cardiac Rehab from 10/29/2018 in [facility] Cardiac and Pulmonary Rehab  Date  10/24/18  Educator  CE  Instruction Review Code  1- Verbalizes Understanding      General Nutrition Guidelines/Fats and Fiber: -Group instruction provided by  verbal, written material, models and posters to present the general guidelines for heart healthy nutrition. Gives an explanation and review of dietary fats and fiber.   Cardiac Rehab from 10/29/2018 in [facility] Cardiac and Pulmonary Rehab  Date  10/29/18  Educator  LB  Instruction Review Code  1- Verbalizes Understanding      Controlling Sodium/Reading Food Labels: -Group verbal and written material supporting  the discussion of sodium use in heart healthy nutrition. Review and explanation with models, verbal and written materials for utilization of the food label.   Cardiac Rehab from 10/29/2018 in [facility] Cardiac and Pulmonary Rehab  Date  09/12/18  Educator  LB  Instruction Review Code  1- Verbalizes Understanding      Exercise Physiology & General Exercise Guidelines: - Group verbal and written instruction with models to review the exercise physiology of the cardiovascular system and associated critical values. Provides general exercise guidelines with specific guidelines to those with heart or lung disease.    Cardiac Rehab from 10/29/2018 in [facility] Cardiac and Pulmonary Rehab  Date  09/19/18  Educator  [facility]  Instruction Review Code  1- Verbalizes Understanding      Aerobic Exercise & Resistance Training: - Gives group verbal and written instruction on the various components of exercise. Focuses on aerobic and resistive training programs and the benefits of this training and how to safely progress through these programs..   Cardiac Rehab from 10/29/2018 in [facility] Cardiac and Pulmonary Rehab  Date  09/24/18  Educator  [facility]  Instruction Review Code  1- Geologist, engineering, Balance, Mind/Body Relaxation: Provides group verbal/written instruction on the benefits of flexibility and balance training, including mind/body exercise modes such as yoga, pilates and tai chi.  Demonstration and skill practice provided.   Cardiac Rehab from 10/29/2018 in [facility] Cardiac and Pulmonary Rehab   Date  10/01/18  Educator  AS  Instruction Review Code  1- Verbalizes Understanding      Stress and Anxiety: - Provides group verbal and written instruction about the health risks of elevated stress and causes of high stress.  Discuss the correlation between heart/lung disease and anxiety and treatment options. Review healthy ways to manage with stress and anxiety.   Cardiac Rehab from 10/29/2018 in [facility] Cardiac and Pulmonary Rehab  Date  10/10/18  Educator  Lucianne Lei, MSW  Instruction Review Code  1- Verbalizes Understanding      Depression: - Provides group verbal and written instruction on the correlation between heart/lung disease and depressed mood, treatment options, and the stigmas associated with seeking treatment.   Anatomy & Physiology of the Heart: - Group verbal and written instruction and models provide basic cardiac anatomy and physiology, with the coronary electrical and arterial systems. Review of Valvular disease and Heart Failure   Cardiac Rehab from 10/29/2018 in [facility] Cardiac and Pulmonary Rehab  Date  10/08/18  Educator  MA  Instruction Review Code  1- Verbalizes Understanding      Cardiac Procedures: - Group verbal and written instruction to review commonly prescribed medications for heart disease. Reviews the medication, class of the drug, and side effects. Includes the steps to properly store meds and maintain the prescription regimen. (beta blockers and nitrates)   Cardiac Rehab from 10/29/2018 in [facility] Cardiac and Pulmonary Rehab  Date  10/22/18  Educator  CE  Instruction Review Code  1- Verbalizes Understanding      Cardiac Medications I: - Group verbal and written instruction to review commonly prescribed medications for heart disease. Reviews the medication, class of the drug, and side effects. Includes the steps to properly store meds and maintain the prescription regimen.   Cardiac Rehab from 10/29/2018 in [facility] Cardiac and Pulmonary Rehab  Date   10/15/18  Educator  CE  Instruction Review Code  1- Verbalizes Understanding      Cardiac Medications II: -Group verbal and written instruction  to review commonly prescribed medications for heart disease. Reviews the medication, class of the drug, and side effects. (all other drug classes)   Cardiac Rehab from 10/29/2018 in [facility] Cardiac and Pulmonary Rehab  Date  10/03/18  Educator  C.Enterkin, RN  Instruction Review Code  1- Verbalizes Understanding       Go Sex-Intimacy & Heart Disease, Get SMART - Goal Setting: - Group verbal and written instruction through game format to discuss heart disease and the return to sexual intimacy. Provides group verbal and written material to discuss and apply goal setting through the application of the S.M.A.R.T. Method.   Cardiac Rehab from 10/29/2018 in [facility] Cardiac and Pulmonary Rehab  Date  10/22/18  Educator  CE  Instruction Review Code  1- Verbalizes Understanding      Other Matters of the Heart: - Provides group verbal, written materials and models to describe Stable Angina and Peripheral Artery. Includes description of the disease process and treatment options available to the cardiac patient.   Exercise & Equipment Safety: - Individual verbal instruction and demonstration of equipment use and safety with use of the equipment.   Cardiac Rehab from 10/29/2018 in [facility] Cardiac and Pulmonary Rehab  Date  09/10/18  Educator  [facility]  Instruction Review Code  1- Verbalizes Understanding      Infection Prevention: - Provides verbal and written material to individual with discussion of infection control including proper hand washing and proper equipment cleaning during exercise session.   Cardiac Rehab from 10/29/2018 in [facility] Cardiac and Pulmonary Rehab  Date  09/10/18  Educator  [facility]  Instruction Review Code  1- Verbalizes Understanding      Falls Prevention: - Provides verbal and written material to individual with discussion of falls  prevention and safety.   Cardiac Rehab from 10/29/2018 in [facility] Cardiac and Pulmonary Rehab  Date  09/10/18  Educator  [facility]  Instruction Review Code  1- Verbalizes Understanding      Diabetes: - Individual verbal and written instruction to review signs/symptoms of diabetes, desired ranges of glucose level fasting, after meals and with exercise. Acknowledge that pre and post exercise glucose checks will be done for 3 sessions at entry of program.   Cardiac Rehab from 10/29/2018 in [facility] Cardiac and Pulmonary Rehab  Date  09/10/18  Educator  [facility]  Instruction Review Code  1- Verbalizes Understanding      Know Your Numbers and Risk Factors: -Group verbal and written instruction about important numbers in your health.  Discussion of what are risk factors and how they play a role in the disease process.  Review of Cholesterol, Blood Pressure, Diabetes, and BMI and the role they play in your overall health.   Cardiac Rehab from 10/29/2018 in [facility] Cardiac and Pulmonary Rehab  Date  10/03/18  Educator  C.Enterkin, RN  Instruction Review Code  1- [employer] Understanding      Sleep Hygiene: -Provides group verbal and written instruction about how sleep can affect your health.  Define sleep hygiene, discuss sleep cycles and impact of sleep habits. Review good sleep hygiene tips.    Other: -Provides group and verbal instruction on various topics (see comments)   Knowledge Questionnaire Score: Knowledge Questionnaire Score - 10/29/18 0908      Knowledge Questionnaire Score   Pre Score  22/26    Post Score  24/26   reviewed with pt      Core Components/Risk Factors/Patient Goals at Admission: Personal Goals and Risk Factors at Admission - 09/10/18  1411      Core Components/Risk Factors/Patient Goals on Admission    Weight Management  Yes;Weight Loss    Intervention  Weight Management: Develop a combined nutrition and exercise program designed to reach desired caloric intake, while  maintaining appropriate intake of nutrient and fiber, sodium and fats, and appropriate energy expenditure required for the weight goal.;Weight Management: Provide education and appropriate resources to help participant work on and attain dietary goals.;Weight Management/Obesity: Establish reasonable short term and long term weight goals.    Admit Weight  221 lb 6.4 oz (100.4 kg)    Goal Weight: Short Term  217 lb (98.4 kg)    Goal Weight: Long Term  200 lb (90.7 kg)    Expected Outcomes  Short Term: Continue to assess and modify interventions until short term weight is achieved;Long Term: Adherence to nutrition and physical activity/exercise program aimed toward attainment of established weight goal;Weight Loss: Understanding of general recommendations for a balanced deficit meal plan, which promotes 1-2 lb weight loss per week and includes a negative energy balance of [phone] kcal/d;Understanding recommendations for meals to include 15-35% energy as protein, 25-35% energy from fat, 35-60% energy from carbohydrates, less than 270m of dietary cholesterol, 20-35 gm of total fiber daily;Understanding of distribution of calorie intake throughout the day with the consumption of 4-5 meals/snacks    Diabetes  Yes    Intervention  Provide education about signs/symptoms and action to take for hypo/hyperglycemia.;Provide education about proper nutrition, including hydration, and aerobic/resistive exercise prescription along with prescribed medications to achieve blood glucose in normal ranges: Fasting glucose 65-99 mg/dL    Expected Outcomes  Short Term: Participant verbalizes understanding of the signs/symptoms and immediate care of hyper/hypoglycemia, proper foot care and importance of medication, aerobic/resistive exercise and nutrition plan for blood glucose control.;Long Term: Attainment of HbA1C < 7%.    Hypertension  Yes    Intervention  Provide education on lifestyle modifcations including regular physical  activity/exercise, weight management, moderate sodium restriction and increased consumption of fresh fruit, vegetables, and low fat dairy, alcohol moderation, and smoking cessation.;Monitor prescription use compliance.    Expected Outcomes  Short Term: Continued assessment and intervention until BP is < 140/933mHG in hypertensive participants. < 130/8063mG in hypertensive participants with diabetes, heart failure or chronic kidney disease.;Long Term: Maintenance of blood pressure at goal levels.       Core Components/Risk Factors/Patient Goals Review:  Goals and Risk Factor Review    Row Name 10/15/18 1700             Core Components/Risk Factors/Patient Goals Review   Personal Goals Review  Weight Management/Obesity;Lipids;Hypertension;Stress;Diabetes       Review  SteRichardson Landry taking all meds as directed.  He is maintaining current weight.  He can tell his clothes fit better.  He added interval training to exercise last week and likes the challenge of reaching higher levels.  His family is moving to a new home soon and he is excited to have a new kitchen set up like he wants to cook.  He is also getting a recumbent bike to use at home.       Expected Outcomes  Short - continue to attend HT Long - maintain exercise on hi sown          Core Components/Risk Factors/Patient Goals at Discharge (Final Review):  Goals and Risk Factor Review - 10/15/18 1700      Core Components/Risk Factors/Patient Goals Review   Personal Goals Review  Weight Management/Obesity;Lipids;Hypertension;Stress;Diabetes    Review  [name] is taking all meds as directed.  He is maintaining current weight.  He can tell his clothes fit better.  He added interval training to exercise last week and likes the challenge of reaching higher levels.  His family is moving to a new home soon and he is excited to have a new kitchen set up like he wants to cook.  He is also getting a recumbent bike to use at home.    Expected Outcomes   Short - continue to attend HT Long - maintain exercise on hi sown       ITP Comments: ITP Comments    Row Name 09/10/18 1400 10/03/18 0650 10/31/18 0541       ITP Comments  Med Review completed. Initial ITP created. Diagnosis can be found in [facility] 8/28  30 day review.  Continue with ITP unless directed changes per Medical Director review.  30 day review. Continue with ITP unless direccted changes per Medical Director Chart Review.        Comments:

## 2018-10-31 NOTE — Progress Notes (Signed)
Discharge Progress Report  Patient Details  Name: John Hartman MRN: [id number] Date of Birth: [birth year] Referring Provider:     Cardiac Rehab from 09/10/2018 in [facility]  Referring Provider  Isaias Cowman MD       Number of Visits: 36/36  Reason for Discharge:  Patient reached a stable level of exercise. Patient independent in their exercise. Patient has met program and personal goals.  Smoking History:  Social History   Tobacco Use  Smoking Status Former Smoker  . Packs/day: 1.00  . Years: 10.00  . Pack years: 10.00  . Types: Cigarettes  . Last attempt to quit: 04/15/2015  . Years since quitting: 3.5  Smokeless Tobacco Never Used    Diagnosis:  Status post coronary artery stent placement  ADL UCSD:   Initial Exercise Prescription: Initial Exercise Prescription - 09/10/18 1400      Date of Initial Exercise RX and Referring Provider   Date  09/10/18    Referring Provider  Isaias Cowman MD      Treadmill   MPH  2.5    Grade  2    Minutes  15    METs  3.6      Recumbant Bike   Level  4    RPM  60    Watts  72    Minutes  15    METs  3      T5 Nustep   Level  4    SPM  80    Minutes  15    METs  3      Prescription Details   Frequency (times per week)  3    Duration  Progress to 45 minutes of aerobic exercise without signs/symptoms of physical distress      Intensity   THRR 40-80% of Max Heartrate  110-153    Ratings of Perceived Exertion  11-13    Perceived Dyspnea  0-4      Progression   Progression  Continue to progress workloads to maintain intensity without signs/symptoms of physical distress.      Resistance Training   Training Prescription  Yes    Weight  4 lbs    Reps  10-15       Discharge Exercise Prescription (Final Exercise Prescription Changes): Exercise Prescription Changes - 10/23/18 1500      Response to Exercise   Blood Pressure (Admit)  120/80    Blood Pressure (Exercise)  188/94     Blood Pressure (Exit)  130/82    Heart Rate (Admit)  60 bpm    Heart Rate (Exercise)  158 bpm    Heart Rate (Exit)  113 bpm    Rating of Perceived Exertion (Exercise)  13    Symptoms  none    Duration  Continue with 45 min of aerobic exercise without signs/symptoms of physical distress.    Intensity  THRR unchanged      Progression   Progression  Continue to progress workloads to maintain intensity without signs/symptoms of physical distress.    Average METs  4.5      Resistance Training   Training Prescription  Yes    Weight  7 lb    Reps  10-15      Interval Training   Interval Training  No      Treadmill   MPH  3.3    Grade  2.5    Minutes  15    METs  4.66  Recumbant Bike   Level  4    RPM  60    Minutes  15    METs  6      T5 Nustep   Level  6    Minutes  15    METs  2.6       Functional Capacity: 6 Minute Walk    Row Name 09/10/18 1441 10/29/18 0858       6 Minute Walk   Phase  Initial  Discharge    Distance  1380 feet  2000 feet    Distance % Change  -  44.9 %    Distance Feet Change  -  620 ft    Walk Time  6 minutes  6 minutes    # of Rest Breaks  0  0    MPH  2.61  3.78    METS  4.26  5.95    RPE  12  13    Perceived Dyspnea   2  -    VO2 Peak  14.93  20.83    Symptoms  Yes (comment)  Yes (comment)    Comments  fatigue  calves tired    Resting HR  67 bpm  78 bpm    Resting BP  128/54  126/72    Resting Oxygen Saturation   97 %  99 %    Exercise Oxygen Saturation  during 6 min walk  99 %  95 %    Max Ex. HR  91 bpm  128 bpm    Max Ex. BP  138/74  154/78    2 Minute Post BP  126/70  -       Psychological, QOL, Others - Outcomes: PHQ 2/9: Depression screen [facility] 2/9 10/30/2018 10/29/2018 09/18/2018 09/10/2018 08/09/2018  Decreased Interest 0 0 0 0 0  Down, Depressed, Hopeless 0 0 1 0 0  PHQ - 2 Score 0 0 1 0 0  Altered sleeping 0 0 - 0 -  Tired, decreased energy 0 1 - 2 -  Change in appetite 0 0 - 1 -  Feeling bad or failure about  yourself  0 0 - 1 -  Trouble concentrating 0 0 - 0 -  Moving slowly or fidgety/restless 0 0 - 0 -  Suicidal thoughts 0 0 - 0 -  PHQ-9 Score 0 1 - 4 -  Difficult doing work/chores Not difficult at all Not difficult at all - Very difficult -    Quality of Life: Quality of Life - 10/29/18 0908      Quality of Life   Select  Quality of Life      Quality of Life Scores   Health/Function Pre  15.5 %    Health/Function Post  20.7 %    Health/Function % Change  33.55 %    Socioeconomic Pre  23.36 %    Socioeconomic Post  23 %    Socioeconomic % Change   -1.54 %    Psych/Spiritual Pre  16.5 %    Psych/Spiritual Post  21.71 %    Psych/Spiritual % Change  31.58 %    Family Pre  28.8 %    Family Post  26.4 %    Family % Change  -8.33 %    GLOBAL Pre  19.28 %    GLOBAL Post  22.22 %    GLOBAL % Change  15.25 %       Personal Goals: Goals established at orientation with interventions provided to  work toward goal. Personal Goals and Risk Factors at Admission - 09/10/18 1411      Core Components/Risk Factors/Patient Goals on Admission    Weight Management  Yes;Weight Loss    Intervention  Weight Management: Develop a combined nutrition and exercise program designed to reach desired caloric intake, while maintaining appropriate intake of nutrient and fiber, sodium and fats, and appropriate energy expenditure required for the weight goal.;Weight Management: Provide education and appropriate resources to help participant work on and attain dietary goals.;Weight Management/Obesity: Establish reasonable short term and long term weight goals.    Admit Weight  221 lb 6.4 oz (100.4 kg)    Goal Weight: Short Term  217 lb (98.4 kg)    Goal Weight: Long Term  200 lb (90.7 kg)    Expected Outcomes  Short Term: Continue to assess and modify interventions until short term weight is achieved;Long Term: Adherence to nutrition and physical activity/exercise program aimed toward attainment of established  weight goal;Weight Loss: Understanding of general recommendations for a balanced deficit meal plan, which promotes 1-2 lb weight loss per week and includes a negative energy balance of [phone] kcal/d;Understanding recommendations for meals to include 15-35% energy as protein, 25-35% energy from fat, 35-60% energy from carbohydrates, less than 211m of dietary cholesterol, 20-35 gm of total fiber daily;Understanding of distribution of calorie intake throughout the day with the consumption of 4-5 meals/snacks    Diabetes  Yes    Intervention  Provide education about signs/symptoms and action to take for hypo/hyperglycemia.;Provide education about proper nutrition, including hydration, and aerobic/resistive exercise prescription along with prescribed medications to achieve blood glucose in normal ranges: Fasting glucose 65-99 mg/dL    Expected Outcomes  Short Term: Participant verbalizes understanding of the signs/symptoms and immediate care of hyper/hypoglycemia, proper foot care and importance of medication, aerobic/resistive exercise and nutrition plan for blood glucose control.;Long Term: Attainment of HbA1C < 7%.    Hypertension  Yes    Intervention  Provide education on lifestyle modifcations including regular physical activity/exercise, weight management, moderate sodium restriction and increased consumption of fresh fruit, vegetables, and low fat dairy, alcohol moderation, and smoking cessation.;Monitor prescription use compliance.    Expected Outcomes  Short Term: Continued assessment and intervention until BP is < 140/968mHG in hypertensive participants. < 130/8045mG in hypertensive participants with diabetes, heart failure or chronic kidney disease.;Long Term: Maintenance of blood pressure at goal levels.        Personal Goals Discharge: Goals and Risk Factor Review    Row Name 10/15/18 1700             Core Components/Risk Factors/Patient Goals Review   Personal Goals Review  Weight  Management/Obesity;Lipids;Hypertension;Stress;Diabetes       Review  SteRichardson Landry taking all meds as directed.  He is maintaining current weight.  He can tell his clothes fit better.  He added interval training to exercise last week and likes the challenge of reaching higher levels.  His family is moving to a new home soon and he is excited to have a new kitchen set up like he wants to cook.  He is also getting a recumbent bike to use at home.       Expected Outcomes  Short - continue to attend HT Long - maintain exercise on hi sown          Exercise Goals and Review: Exercise Goals    Row Name 09/10/18 1445  Exercise Goals   Increase Physical Activity  Yes       Intervention  Provide advice, education, support and counseling about physical activity/exercise needs.;Develop an individualized exercise prescription for aerobic and resistive training based on initial evaluation findings, risk stratification, comorbidities and participant's personal goals.       Expected Outcomes  Short Term: Attend rehab on a regular basis to increase amount of physical activity.;Long Term: Add in home exercise to make exercise part of routine and to increase amount of physical activity.;Long Term: Exercising regularly at least 3-5 days a week.       Increase Strength and Stamina  Yes       Intervention  Provide advice, education, support and counseling about physical activity/exercise needs.;Develop an individualized exercise prescription for aerobic and resistive training based on initial evaluation findings, risk stratification, comorbidities and participant's personal goals.       Expected Outcomes  Short Term: Increase workloads from initial exercise prescription for resistance, speed, and METs.;Short Term: Perform resistance training exercises routinely during rehab and add in resistance training at home;Long Term: Improve cardiorespiratory fitness, muscular endurance and strength as measured by  increased METs and functional capacity (6MWT)       Able to understand and use rate of perceived exertion (RPE) scale  Yes       Intervention  Provide education and explanation on how to use RPE scale       Expected Outcomes  Short Term: Able to use RPE daily in rehab to express subjective intensity level;Long Term:  Able to use RPE to guide intensity level when exercising independently       Able to understand and use Dyspnea scale  Yes       Intervention  Provide education and explanation on how to use Dyspnea scale       Expected Outcomes  Short Term: Able to use Dyspnea scale daily in rehab to express subjective sense of shortness of breath during exertion;Long Term: Able to use Dyspnea scale to guide intensity level when exercising independently       Knowledge and understanding of Target Heart Rate Range (THRR)  Yes       Intervention  Provide education and explanation of THRR including how the numbers were predicted and where they are located for reference       Expected Outcomes  Short Term: Able to state/look up THRR;Short Term: Able to use daily as guideline for intensity in rehab;Long Term: Able to use THRR to govern intensity when exercising independently       Able to check pulse independently  Yes       Intervention  Provide education and demonstration on how to check pulse in carotid and radial arteries.;Review the importance of being able to check your own pulse for safety during independent exercise       Expected Outcomes  Short Term: Able to explain why pulse checking is important during independent exercise;Long Term: Able to check pulse independently and accurately       Understanding of Exercise Prescription  Yes       Intervention  Provide education, explanation, and written materials on patient's individual exercise prescription       Expected Outcomes  Short Term: Able to explain program exercise prescription;Long Term: Able to explain home exercise prescription to exercise  independently          Nutrition & Weight - Outcomes: Pre Biometrics - 09/10/18 1446      Pre Biometrics  Height  5' 11.9" (1.826 m)    Weight  221 lb 6.4 oz (100.4 kg)    Waist Circumference  40.5 inches    Hip Circumference  39 inches    Waist to Hip Ratio  1.04 %    BMI (Calculated)  30.12    Single Leg Stand  29.53 seconds        Nutrition: Nutrition Therapy & Goals - 09/19/18 1516      Nutrition Therapy   Diet  DM    Drug/Food Interactions  Purine/Gout    Protein (specify units)  12oz    Fiber  35 grams    Whole Grain Foods  3 servings   chooses whole grains regularly   Saturated Fats  16 max. grams    Fruits and Vegetables  8 servings/day    Sodium  1500 grams      Personal Nutrition Goals   Nutrition Goal  Start to pay more attention to the amount of sodium you are consuming each day    Personal Goal #2  Continue to choose lean protein sources, whole grains, sugar free beverages, high protein and fiber snack options (protein shake + fruit, hummus + carrots), and fruits and vegetables daily- great job for making all these changes!    Personal Goal #3  Now that you've started to implement breakfast daily and eating out less often, work to make these changes perminant lifestyle habits    Comments  Since his heart attack he has gone "all in" on making changes to his diet to improve his health. He no longer eats out often, chooses more whole foods, does not eat fried foods (uses an air fryer), eats breakfast, chooses nutrient-dense snacks, and chooses sugar free beverages. He works with his wife to prepare meals for the week in advance and has reduced his portion sizes. Wt loss of 27# in approx 3 months. He reports to have dramatically improved his HgbA1c level      Intervention Plan   Intervention  Prescribe, educate and counsel regarding individualized specific dietary modifications aiming towards targeted core components such as weight, hypertension, lipid management,  diabetes, heart failure and other comorbidities.    Expected Outcomes  Short Term Goal: Understand basic principles of dietary content, such as calories, fat, sodium, cholesterol and nutrients.;Short Term Goal: A plan has been developed with personal nutrition goals set during dietitian appointment.;Long Term Goal: Adherence to prescribed nutrition plan.       Nutrition Discharge: Nutrition Assessments - 10/29/18 0907      MEDFICTS Scores   Pre Score  30    Post Score  12    Score Difference  -18       Education Questionnaire Score: Knowledge Questionnaire Score - 10/29/18 0908      Knowledge Questionnaire Score   Pre Score  22/26    Post Score  24/26   reviewed with pt      Goals reviewed with patient; copy given to patient.

## 2018-10-31 NOTE — Progress Notes (Signed)
Cardiac Individual Treatment Plan  Patient Details  Name: Steve Sanagustin MRN: [id number] Date of Birth: 03/30/1971 Referring Provider:     Cardiac Rehab from 09/10/2018 in [facility]  Referring Provider  Paraschos, Alexander MD      Initial Encounter Date:    Cardiac Rehab from 09/10/2018 in [facility]  Date  09/10/18      Visit Diagnosis: Status post coronary artery stent placement  Patient's Home Medications on Admission:  Current Outpatient Medications:  .  ACCU-CHEK SOFTCLIX LANCETS lancets, USE AS DIRECTED, Disp: 100 each, Rfl: 0 .  allopurinol (ZYLOPRIM) 100 MG tablet, Take 100 mg by mouth every morning. , Disp: , Rfl:  .  aspirin EC 81 MG tablet, Take 81 mg by mouth daily., Disp: , Rfl:  .  atorvastatin (LIPITOR) 80 MG tablet, Take 1 tablet (80 mg total) by mouth daily at 6 PM., Disp: 30 tablet, Rfl: 0 .  [place] alk-PHENObarbital (DONNATAL) 16.2 MG tablet, Take 1 tablet by mouth as needed (pain)., Disp: , Rfl:  .  Buprenorphine HCl (BELBUCA) 150 MCG FILM, Place 150 mcg inside cheek every 12 (twelve) hours., Disp: 60 each, Rfl: 1 .  cetirizine (ZYRTEC) 10 MG tablet, Take 10 mg by mouth at bedtime. , Disp: , Rfl:  .  colchicine 0.6 MG tablet, Take 0.6 mg by mouth daily as needed (gout). , Disp: , Rfl:  .  Dapagliflozin-metFORMIN HCl ER (XIGDUO XR) 04-999 MG TB24, Take 2 tablets by mouth daily. , Disp: , Rfl:  .  diphenhydrAMINE (BENADRYL) 25 mg capsule, Take 50 mg by mouth at bedtime. , Disp: , Rfl:  .  EPINEPHrine (EPIPEN 2-PAK) 0.3 mg/0.3 mL IJ SOAJ injection, Inject 0.3 mg into the muscle once., Disp: , Rfl:  .  glucose blood (ACCU-CHEK ACTIVE STRIPS) test strip, Use as instructed, Disp: 100 each, Rfl: 12 .  glucose blood (ACCU-CHEK AVIVA PLUS) test strip, Check FSBS three to four times per day, Disp: 100 each, Rfl: 1 .  hydrALAZINE (APRESOLINE) 25 MG tablet, Take 1 tablet (25 mg total) by mouth 2 (two) times daily., Disp: 60 tablet,  Rfl: 0 .  hydrochlorothiazide (HYDRODIURIL) 12.5 MG tablet, Take 12.5 mg by mouth daily., Disp: , Rfl:  .  indomethacin (INDOCIN) 25 MG capsule, Take 25 mg by mouth 2 (two) times daily as needed for mild pain., Disp: , Rfl:  .  Insulin Degludec (TRESIBA FLEXTOUCH) 100 UNIT/ML SOPN, Inject 34 Units into the skin at bedtime. , Disp: 1 pen, Rfl: 0 .  Insulin Pen Needle (B-D UF III MINI PEN NEEDLES) 31G X 5 MM MISC, Once a day with insulin, Disp: 50 each, Rfl: 3 .  Melatonin 5 MG TABS, Take 5 mg by mouth at bedtime. , Disp: , Rfl:  .  metoprolol tartrate (LOPRESSOR) 50 MG tablet, Take 1 tablet (50 mg total) by mouth 2 (two) times daily. (Patient taking differently: Take 25 mg by mouth 2 (two) times daily. ), Disp: 60 tablet, Rfl: 0 .  nitroGLYCERIN (NITROSTAT) 0.4 MG SL tablet, Place 1 tablet (0.4 mg total) under the tongue every 5 (five) minutes as needed for chest pain., Disp: 30 tablet, Rfl: 0 .  oxyCODONE-acetaminophen (PERCOCET) 7.5-325 MG tablet, Take 1 tablet by mouth 3 (three) times daily as needed for severe pain., Disp: 90 tablet, Rfl: 0 .  pregabalin (LYRICA) 50 MG capsule, Take 1 capsule (50 mg total) by mouth 3 (three) times daily., Disp: 90 capsule, Rfl: 2 .    ranitidine (ZANTAC) 150 MG capsule, Take 150 mg by mouth at bedtime. , Disp: , Rfl:  .  ticagrelor (BRILINTA) 90 MG TABS tablet, Take 90 mg by mouth 2 (two) times daily., Disp: , Rfl:  .  tiZANidine (ZANAFLEX) 4 MG tablet, Take 1 tablet (4 mg total) by mouth every 8 (eight) hours as needed for muscle spasms., Disp: 90 tablet, Rfl: 5  Past Medical History: Past Medical History:  Diagnosis Date  . Angioedema   . Bronchitis   . DDD (degenerative disc disease), cervical   . Diabetes mellitus ([place])   . Fatty liver 03/31/2017   [place] April 2018  . GERD (gastroesophageal reflux disease)   . Gout   . Heart attack ([place])   . HTN (hypertension)   . Hypertension    CONTROLLED ON MEDS  . IBS (irritable bowel syndrome)   . Myocardial  infarction ([place]) 08/23/2018  . Seasonal allergies   . Spinal stenosis     Tobacco Use: Social History   Tobacco Use  Smoking Status Former Smoker  . Packs/day: 1.00  . Years: 10.00  . Pack years: 10.00  . Types: Cigarettes  . Last attempt to quit: 04/15/2015  . Years since quitting: 3.5  Smokeless Tobacco Never Used    Labs: Recent Review Flowsheet Data    Labs for ITP Cardiac and Pulmonary Rehab Latest Ref Rng & Units 12/28/2016 02/10/2017 08/04/2017 08/10/2018 08/30/2018   Cholestrol <200 mg/dL 135 123 140 - 147   LDLCALC mg/dL (calc) 86 68 88 - 98   HDL >40 mg/dL 30(L) 29(L) 34(L) - 26(L)   Trlycerides <150 mg/dL 96 132 90 - 133   Hemoglobin A1c 4.0 - 6.0 - - - 7.1(A) -       Exercise Target Goals: Exercise Program Goal: Individual exercise prescription set using results from initial 6 min walk test and THRR while considering  patient's activity barriers and safety.   Exercise Prescription Goal: Initial exercise prescription builds to 30-45 minutes a day of aerobic activity, 2-3 days per week.  Home exercise guidelines will be given to patient during program as part of exercise prescription that the participant will acknowledge.  Activity Barriers & Risk Stratification: Activity Barriers & Cardiac Risk Stratification - 09/10/18 1442      Activity Barriers & Cardiac Risk Stratification   Activity Barriers  Back Problems;Neck/Spine Problems;Deconditioning;Muscular Weakness;Other (comment);Arthritis    Comments  chronic back pain, needs back surgery for 4 herniated disc and 2 dislocated, DJD, being seen by pain clinic, mobility limited by pain level on some days    Cardiac Risk Stratification  Moderate       6 Minute Walk: 6 Minute Walk    Row Name 09/10/18 1441 10/29/18 0858       6 Minute Walk   Phase  Initial  Discharge    Distance  1380 feet  2000 feet    Distance % Change  -  44.9 %    Distance Feet Change  -  620 ft    Walk Time  6 minutes  6 minutes    # of  Rest Breaks  0  0    MPH  2.61  3.78    METS  4.26  5.95    RPE  12  13    Perceived Dyspnea   2  -    VO2 Peak  14.93  20.83    Symptoms  Yes (comment)  Yes (comment)    Comments  fatigue  calves  tired    Resting HR  67 bpm  78 bpm    Resting BP  128/54  126/72    Resting Oxygen Saturation   97 %  99 %    Exercise Oxygen Saturation  during 6 min walk  99 %  95 %    Max Ex. HR  91 bpm  128 bpm    Max Ex. BP  138/74  154/78    2 Minute Post BP  126/70  -       Oxygen Initial Assessment:   Oxygen Re-Evaluation:   Oxygen Discharge (Final Oxygen Re-Evaluation):   Initial Exercise Prescription: Initial Exercise Prescription - 09/10/18 1400      Date of Initial Exercise RX and Referring Provider   Date  09/10/18    Referring Provider  Paraschos, Alexander MD      Treadmill   MPH  2.5    Grade  2    Minutes  15    METs  3.6      Recumbant Bike   Level  4    RPM  60    Watts  72    Minutes  15    METs  3      T5 Nustep   Level  4    SPM  80    Minutes  15    METs  3      Prescription Details   Frequency (times per week)  3    Duration  Progress to 45 minutes of aerobic exercise without signs/symptoms of physical distress      Intensity   THRR 40-80% of Max Heartrate  110-153    Ratings of Perceived Exertion  11-13    Perceived Dyspnea  0-4      Progression   Progression  Continue to progress workloads to maintain intensity without signs/symptoms of physical distress.      Resistance Training   Training Prescription  Yes    Weight  4 lbs    Reps  10-15       Perform Capillary Blood Glucose checks as needed.  Exercise Prescription Changes: Exercise Prescription Changes    Row Name 09/10/18 1400 09/19/18 0900 09/24/18 1400 10/10/18 1000 10/23/18 1500     Response to Exercise   Blood Pressure (Admit)  128/54  -  126/72  104/66  120/80   Blood Pressure (Exercise)  138/74  -  136/62  142/84  188/94   Blood Pressure (Exit)  126/70  -  108/60  104/64   130/82   Heart Rate (Admit)  67 bpm  -  86 bpm  81 bpm  60 bpm   Heart Rate (Exercise)  91 bpm  -  128 bpm  143 bpm  158 bpm   Heart Rate (Exit)  75 bpm  -  114 bpm  107 bpm  113 bpm   Oxygen Saturation (Admit)  97 %  -  -  -  -   Oxygen Saturation (Exercise)  94 %  -  -  -  -   Rating of Perceived Exertion (Exercise)  12  -  14  14  13   Perceived Dyspnea (Exercise)  2  -  -  -  -   Symptoms  fatigue  -  occasional back pain  none  none   Comments  walk test results  -  -  -  -   Duration  -  -  Continue with 45 min of   aerobic exercise without signs/symptoms of physical distress.  Continue with 45 min of aerobic exercise without signs/symptoms of physical distress.  Continue with 45 min of aerobic exercise without signs/symptoms of physical distress.   Intensity  -  -  THRR unchanged  THRR unchanged  THRR unchanged     Progression   Progression  -  -  Continue to progress workloads to maintain intensity without signs/symptoms of physical distress.  Continue to progress workloads to maintain intensity without signs/symptoms of physical distress.  Continue to progress workloads to maintain intensity without signs/symptoms of physical distress.   Average METs  -  -  3.84  3.83  4.5     Resistance Training   Training Prescription  -  -  Yes  -  Yes   Weight  -  -  7 lbs  7 lb  7 lb   Reps  -  -  10-15  10-15  10-15     Interval Training   Interval Training  -  -  No  No  No     Treadmill   MPH  -  -  3  -  3.3   Grade  -  -  2  -  2.5   Minutes  -  -  15  -  15   METs  -  -  4.12  -  4.66     Recumbant Bike   Level  -  -  4  4  4   RPM  -  -  -  60  60   Watts  -  -  -  72  -   Minutes  -  -  15  15  15   METs  -  -  4.4  4.4  6     T5 Nustep   Level  -  -  4  6  6   Minutes  -  -  15  15  15   METs  -  -  3  3  2.6     Home Exercise Plan   Plans to continue exercise at  -  Home (comment) walking, weights, recumbent bike  Home (comment) walking, weights, recumbent bike  Home  (comment) walking, weights, recumbent bike  -   Frequency  -  Add 2 additional days to program exercise sessions.  Add 2 additional days to program exercise sessions.  Add 2 additional days to program exercise sessions.  -   Initial Home Exercises Provided  -  09/19/18  09/19/18  09/19/18  -      Exercise Comments: Exercise Comments    Row Name 09/12/18 0808 10/11/18 1640 10/31/18 0801       Exercise Comments  First full day of exercise!  Patient was oriented to gym and equipment including functions, settings, policies, and procedures.  Patient's individual exercise prescription and treatment plan were reviewed.  All starting workloads were established based on the results of the 6 minute walk test done at initial orientation visit.  The plan for exercise progression was also introduced and progression will be customized based on patient's performance and goals.  Added interval training - pt voiced understanding  Steve graduated today from  rehab with 36 sessions completed.  Details of the patient's exercise prescription and what He needs to do in order to continue the prescription and progress were discussed with patient.  Patient was given a copy of prescription and goals.    Patient verbalized understanding.  Steve plans to continue to exercise by walking and biking at home.        Exercise Goals and Review: Exercise Goals    Row Name 09/10/18 1445             Exercise Goals   Increase Physical Activity  Yes       Intervention  Provide advice, education, support and counseling about physical activity/exercise needs.;Develop an individualized exercise prescription for aerobic and resistive training based on initial evaluation findings, risk stratification, comorbidities and participant's personal goals.       Expected Outcomes  Short Term: Attend rehab on a regular basis to increase amount of physical activity.;Long Term: Add in home exercise to make exercise part of routine and to increase  amount of physical activity.;Long Term: Exercising regularly at least 3-5 days a week.       Increase Strength and Stamina  Yes       Intervention  Provide advice, education, support and counseling about physical activity/exercise needs.;Develop an individualized exercise prescription for aerobic and resistive training based on initial evaluation findings, risk stratification, comorbidities and participant's personal goals.       Expected Outcomes  Short Term: Increase workloads from initial exercise prescription for resistance, speed, and METs.;Short Term: Perform resistance training exercises routinely during rehab and add in resistance training at home;Long Term: Improve cardiorespiratory fitness, muscular endurance and strength as measured by increased METs and functional capacity (6MWT)       Able to understand and use rate of perceived exertion (RPE) scale  Yes       Intervention  Provide education and explanation on how to use RPE scale       Expected Outcomes  Short Term: Able to use RPE daily in rehab to express subjective intensity level;Long Term:  Able to use RPE to guide intensity level when exercising independently       Able to understand and use Dyspnea scale  Yes       Intervention  Provide education and explanation on how to use Dyspnea scale       Expected Outcomes  Short Term: Able to use Dyspnea scale daily in rehab to express subjective sense of shortness of breath during exertion;Long Term: Able to use Dyspnea scale to guide intensity level when exercising independently       Knowledge and understanding of Target Heart Rate Range (THRR)  Yes       Intervention  Provide education and explanation of THRR including how the numbers were predicted and where they are located for reference       Expected Outcomes  Short Term: Able to state/look up THRR;Short Term: Able to use daily as guideline for intensity in rehab;Long Term: Able to use THRR to govern intensity when exercising  independently       Able to check pulse independently  Yes       Intervention  Provide education and demonstration on how to check pulse in carotid and radial arteries.;Review the importance of being able to check your own pulse for safety during independent exercise       Expected Outcomes  Short Term: Able to explain why pulse checking is important during independent exercise;Long Term: Able to check pulse independently and accurately       Understanding of Exercise Prescription  Yes       Intervention  Provide education, explanation, and written materials on patient's individual exercise prescription         Expected Outcomes  Short Term: Able to explain program exercise prescription;Long Term: Able to explain home exercise prescription to exercise independently          Exercise Goals Re-Evaluation : Exercise Goals Re-Evaluation    Row Name 09/12/18 0808 09/19/18 0908 09/24/18 1420 10/10/18 1102 10/23/18 1519     Exercise Goal Re-Evaluation   Exercise Goals Review  Knowledge and understanding of Target Heart Rate Range (THRR);Able to understand and use rate of perceived exertion (RPE) scale;Understanding of Exercise Prescription  Knowledge and understanding of Target Heart Rate Range (THRR);Able to understand and use rate of perceived exertion (RPE) scale;Understanding of Exercise Prescription;Increase Physical Activity;Able to check pulse independently;Increase Strength and Stamina  Understanding of Exercise Prescription;Increase Physical Activity;Increase Strength and Stamina  Increase Physical Activity;Increase Strength and Stamina;Able to understand and use rate of perceived exertion (RPE) scale;Knowledge and understanding of Target Heart Rate Range (THRR);Able to check pulse independently;Understanding of Exercise Prescription  Increase Physical Activity;Increase Strength and Stamina;Able to understand and use rate of perceived exertion (RPE) scale;Able to understand and use Dyspnea  scale;Knowledge and understanding of Target Heart Rate Range (THRR);Able to check pulse independently;Understanding of Exercise Prescription   Comments  Reviewed RPE scale, THR and program prescription with pt today.  Pt voiced understanding and was given a copy of goals to take home.   Reviewed home exercise with pt today.  Pt plans to walking and handweights for exercise.  Steve is also getting a recumbent bike once he moves to the new house.  Reviewed THR, pulse, RPE, sign and symptoms, NTG use, and when to call 911 or MD.  Also discussed weather considerations and indoor options.  Pt voiced understanding.  Steve is off to a good start in rehab.  He would like to try intervals on his seated equipment as his back is his biggest limitation.  He has already increased to 3.0 mph on the treadmill.  We will continue to monitor his progression.   Steve has had improvement in overall METS.  Staff will discuss adding  Interval training to his program.    Steve has increased MET level since adding interval training.  He tolerates intervals well.   Expected Outcomes  Short: Use RPE daily to regulate intensity. Long: Follow program prescription in THR.  Short: Start to add in exercise more at home.  Long: Start to increase activity levels more at home.   Short: Start intervals.  Long: Continue to increase strength and stamina.   Short - add interval training Long - increase oveall METs  Short - continue with interval training 1-2 days per week Long - maintain fitness on his own      Discharge Exercise Prescription (Final Exercise Prescription Changes): Exercise Prescription Changes - 10/23/18 1500      Response to Exercise   Blood Pressure (Admit)  120/80    Blood Pressure (Exercise)  188/94    Blood Pressure (Exit)  130/82    Heart Rate (Admit)  60 bpm    Heart Rate (Exercise)  158 bpm    Heart Rate (Exit)  113 bpm    Rating of Perceived Exertion (Exercise)  13    Symptoms  none    Duration  Continue with  45 min of aerobic exercise without signs/symptoms of physical distress.    Intensity  THRR unchanged      Progression   Progression  Continue to progress workloads to maintain intensity without signs/symptoms of physical distress.    Average METs  4.5        Resistance Training   Training Prescription  Yes    Weight  7 lb    Reps  10-15      Interval Training   Interval Training  No      Treadmill   MPH  3.3    Grade  2.5    Minutes  15    METs  4.66      Recumbant Bike   Level  4    RPM  60    Minutes  15    METs  6      T5 Nustep   Level  6    Minutes  15    METs  2.6       Nutrition:  Target Goals: Understanding of nutrition guidelines, daily intake of sodium <1558m, cholesterol <2083m calories 30% from fat and 7% or less from saturated fats, daily to have 5 or more servings of fruits and vegetables.  Biometrics: Pre Biometrics - 09/10/18 1446      Pre Biometrics   Height  5' 11.9" (1.826 m)    Weight  221 lb 6.4 oz (100.4 kg)    Waist Circumference  40.5 inches    Hip Circumference  39 inches    Waist to Hip Ratio  1.04 %    BMI (Calculated)  30.12    Single Leg Stand  29.53 seconds        Nutrition Therapy Plan and Nutrition Goals: Nutrition Therapy & Goals - 09/19/18 1516      Nutrition Therapy   Diet  DM    Drug/Food Interactions  Purine/Gout    Protein (specify units)  12oz    Fiber  35 grams    Whole Grain Foods  3 servings   chooses whole grains regularly   Saturated Fats  16 max. grams    Fruits and Vegetables  8 servings/day    Sodium  1500 grams      Personal Nutrition Goals   Nutrition Goal  Start to pay more attention to the amount of sodium you are consuming each day    Personal Goal #2  Continue to choose lean protein sources, whole grains, sugar free beverages, high protein and fiber snack options (protein shake + fruit, hummus + carrots), and fruits and vegetables daily- great job for making all these changes!    Personal Goal #3   Now that you've started to implement breakfast daily and eating out less often, work to make these changes perminant lifestyle habits    Comments  Since his heart attack he has gone "all in" on making changes to his diet to improve his health. He no longer eats out often, chooses more whole foods, does not eat fried foods (uses an air fryer), eats breakfast, chooses nutrient-dense snacks, and chooses sugar free beverages. He works with his wife to prepare meals for the week in advance and has reduced his portion sizes. Wt loss of 27# in approx 3 months. He reports to have dramatically improved his HgbA1c level      Intervention Plan   Intervention  Prescribe, educate and counsel regarding individualized specific dietary modifications aiming towards targeted core components such as weight, hypertension, lipid management, diabetes, heart failure and other comorbidities.    Expected Outcomes  Short Term Goal: Understand basic principles of dietary content, such as calories, fat, sodium, cholesterol and nutrients.;Short Term Goal: A plan has been developed with personal nutrition goals set during dietitian appointment.;Long Term Goal: Adherence to prescribed nutrition plan.  Nutrition Assessments: Nutrition Assessments - 10/29/18 0907      MEDFICTS Scores   Pre Score  30    Post Score  12    Score Difference  -18       Nutrition Goals Re-Evaluation: Nutrition Goals Re-Evaluation    [place] Name 09/19/18 1527 10/15/18 1629           Goals   Nutrition Goal  Continue to choose lean protein sources, whole grains, sugar free beverages, high protein and fiber snack options (protein shake + fruit, hummus + carrots), and fruits and vegetables daily- great job for making all these changes!  Start to pay more attention to daily sodium intake; continue to work on making recent lifestyle changes habits and adopt them as perminant practices IE eating out less, eating breakfast more often, eating whole  grains / fruit/ vegetables more often, choosing high fiber and protein snack options, eating lean cuts of meat, and choosing sugar free beverages      Comment  Since his heart attack he has completely changed his approach to eating and has improved overall quality. He has been able to lose weight and improve labs such as his A1c  He admits that the initial scare of the heart attack has worn off a bit and he has caught himself eating some less nutritious snack foods that his wife buys for their son, but he continues to work to maintain the majority of healthy lifestyle practices. They are moving into a new house next week which has an updated kitchen, and he is excited to get to cook more meals in this new space. His wife continues to have fresh cooked vegetables available throughout the week and he chooses whole-food based snacks like apples, cottage cheese, hummus and carrots regularly. He is maintaining his weight s/p a period of wt loss which was desired. He has not started to pay more attention to sodium but has continued to eat out significantly less and he does not add salt to food other than the occasional potato, so he doesn't feel that it is a concern at this time      Expected Outcome  He will adopt new diet practices as perminant lifestyle changes and continue to improve his health outcomes  He will maintain lifestyle changes that were started at the beginning of our program and adopt them perminantly        Personal Goal #2 Re-Evaluation   Personal Goal #2  Start to pay more attention to the amount of sodium you are consuming each day  -        Personal Goal #3 Re-Evaluation   Personal Goal #3  Now that you've started to implement breakfast daily and eating out less often, work to make these changes perminant lifestyle habits  -         Nutrition Goals Discharge (Final Nutrition Goals Re-Evaluation): Nutrition Goals Re-Evaluation - 10/15/18 1629      Goals   Nutrition Goal  Start to pay  more attention to daily sodium intake; continue to work on making recent lifestyle changes habits and adopt them as perminant practices IE eating out less, eating breakfast more often, eating whole grains / fruit/ vegetables more often, choosing high fiber and protein snack options, eating lean cuts of meat, and choosing sugar free beverages    Comment  He admits that the initial scare of the heart attack has worn off a bit and he has caught himself eating some less nutritious snack  foods that his wife buys for their son, but he continues to work to maintain the majority of healthy lifestyle practices. They are moving into a new house next week which has an updated kitchen, and he is excited to get to cook more meals in this new space. His wife continues to have fresh cooked vegetables available throughout the week and he chooses whole-food based snacks like apples, cottage cheese, hummus and carrots regularly. He is maintaining his weight s/p a period of wt loss which was desired. He has not started to pay more attention to sodium but has continued to eat out significantly less and he does not add salt to food other than the occasional potato, so he doesn't feel that it is a concern at this time    Expected Outcome  He will maintain lifestyle changes that were started at the beginning of our program and adopt them perminantly       Psychosocial: Target Goals: Acknowledge presence or absence of significant depression and/or stress, maximize coping skills, provide positive support system. Participant is able to verbalize types and ability to use techniques and skills needed for reducing stress and depression.   Initial Review & Psychosocial Screening: Initial Psych Review & Screening - 09/10/18 1423      Initial Review   Current issues with  Current Stress Concerns    Source of Stress Concerns  Chronic Illness;Unable to participate in former interests or hobbies;Unable to perform yard/household  activities    Comments  Steve has a lot of issues with his back. He was actually in the process of being cleared for back surgery when he had his heart attack. His back hinders him from doing a lot that he would want to do.       Family Dynamics   Good Support System?  Yes   wife     Barriers   Psychosocial barriers to participate in program  The patient should benefit from training in stress management and relaxation.;There are no identifiable barriers or psychosocial needs.      Screening Interventions   Interventions  Encouraged to exercise;Program counselor consult;To provide support and resources with identified psychosocial needs;Provide feedback about the scores to participant    Expected Outcomes  Short Term goal: Utilizing psychosocial counselor, staff and physician to assist with identification of specific Stressors or current issues interfering with healing process. Setting desired goal for each stressor or current issue identified.;Long Term Goal: Stressors or current issues are controlled or eliminated.;Short Term goal: Identification and review with participant of any Quality of Life or Depression concerns found by scoring the questionnaire.;Long Term goal: The participant improves quality of Life and PHQ9 Scores as seen by post scores and/or verbalization of changes       Quality of Life Scores:  Quality of Life - 10/29/18 0908      Quality of Life   Select  Quality of Life      Quality of Life Scores   Health/Function Pre  15.5 %    Health/Function Post  20.7 %    Health/Function % Change  33.55 %    Socioeconomic Pre  23.36 %    Socioeconomic Post  23 %    Socioeconomic % Change   -1.54 %    Psych/Spiritual Pre  16.5 %    Psych/Spiritual Post  21.71 %    Psych/Spiritual % Change  31.58 %    Family Pre  28.8 %    Family Post  26.4 %      Family % Change  -8.33 %    GLOBAL Pre  19.28 %    GLOBAL Post  22.22 %    GLOBAL % Change  15.25 %      Scores of 19 and below  usually indicate a poorer quality of life in these areas.  A difference of  2-3 points is a clinically meaningful difference.  A difference of 2-3 points in the total score of the Quality of Life Index has been associated with significant improvement in overall quality of life, self-image, physical symptoms, and general health in studies assessing change in quality of life.  PHQ-9: Recent Review Flowsheet Data    Depression screen PHQ 2/9 10/30/2018 10/29/2018 09/18/2018 09/10/2018 08/09/2018   Decreased Interest 0 0 0 0 0   Down, Depressed, Hopeless 0 0 1 0 0   PHQ - 2 Score 0 0 1 0 0   Altered sleeping 0 0 - 0 -   Tired, decreased energy 0 1 - 2 -   Change in appetite 0 0 - 1 -   Feeling bad or failure about yourself  0 0 - 1 -   Trouble concentrating 0 0 - 0 -   Moving slowly or fidgety/restless 0 0 - 0 -   Suicidal thoughts 0 0 - 0 -   PHQ-9 Score 0 1 - 4 -   Difficult doing work/chores Not difficult at all Not difficult at all - Very difficult -     Interpretation of Total Score  Total Score Depression Severity:  1-4 = Minimal depression, 5-9 = Mild depression, 10-14 = Moderate depression, 15-19 = Moderately severe depression, 20-27 = Severe depression   Psychosocial Evaluation and Intervention: Psychosocial Evaluation - 09/12/18 0939      Psychosocial Evaluation & Interventions   Interventions  Relaxation education;Stress management education;Encouraged to exercise with the program and follow exercise prescription    Comments  Counselor met with Mr. Sutphin (Steve) today for initial psychosocial evaluation.  He is a 47 year old who had a heart attack and stent inserted on 8/29.  He has a strong support system with a spouse of 18 years; parents and brother in [place]; and active involvement in his local church community.  Steve has multiple health issues with diabetes; chronic back pain awaiting surgery; and a recent diagnosis of sleep apnea in addition to his heart condition.  Steve has  a history of sleep problems but reports since the stent is sleeping much better.  His appetite has improved and he has lost weight approx 30 pounds (as recommended) over the past few months.  Steve denies a history of depression or anxiety or any current symptoms, although he admits to some mood symptoms due to his chronic back pain.  He reports his mood is typically positive in spite of all of this.  He has multiple stressors with building a house; his back pain and the limitations that presents.  Steve has goals to improve his heart health; get back to work on 9/30 and be able to do more normal activities.  He will be followed with staff.      Expected Outcomes  Short:  Steve will exercise consistently to improve his heart health and as a positive coping strategy for his stress.  Long:  Steve will develop a positive self-care routine for his health and his mental health.      Continue Psychosocial Services   Follow up required by staff       Psychosocial   Re-Evaluation: Psychosocial Re-Evaluation    Row Name 10/15/18 0350 10/24/18 1716           Psychosocial Re-Evaluation   Current issues with  Current Stress Concerns  Current Stress Concerns      Comments  [name] and his family are moving to a new house soon. This is an exciting change for them.    Counselor follow up with [name] today reporting enjoying this program and the health benefits of feeling stronger and reducing stress.  He and his wife close on a house tomorrow and he is moving over the next week.  He has taken time off from work to allocate to a smoother transition.  Counselor commended him for being pro-active about his health and his mental health and for his commitment to exercise consistently.        Expected Outcomes  Short - [name] will continue to feel positive about the new home [place] will manage stress with exercise and techniques learned in HT  Short:  [name] will exercise for his health and mental health benefits -  especially stress management.  Long:  [name] will maintain a positive plan of exercise and diet for his overall health and well-being.        Continue Psychosocial Services   -  Follow up required by staff         Psychosocial Discharge (Final Psychosocial Re-Evaluation): Psychosocial Re-Evaluation - 10/24/18 1716      Psychosocial Re-Evaluation   Current issues with  Current Stress Concerns    Comments  Counselor follow up with [name] today reporting enjoying this program and the health benefits of feeling stronger and reducing stress.  He and his wife close on a house tomorrow and he is moving over the next week.  He has taken time off from work to allocate to a smoother transition.  Counselor commended him for being pro-active about his health and his mental health and for his commitment to exercise consistently.      Expected Outcomes  Short:  [name] will exercise for his health and mental health benefits - especially stress management.  Long:  [name] will maintain a positive plan of exercise and diet for his overall health and well-being.      Continue Psychosocial Services   Follow up required by staff       Vocational Rehabilitation: Provide vocational rehab assistance to qualifying candidates.   Vocational Rehab Evaluation & Intervention: Vocational Rehab - 09/10/18 1423      Initial Vocational Rehab Evaluation & Intervention   Assessment shows need for Vocational Rehabilitation  No       Education: Education Goals: Education classes will be provided on a variety of topics geared toward better understanding of heart health and risk factor modification. Participant will state understanding/return demonstration of topics presented as noted by education test scores.  Learning Barriers/Preferences: Learning Barriers/Preferences - 09/10/18 1419      Learning Barriers/Preferences   Learning Barriers  None    Learning Preferences  None       Education Topics:  AED/CPR: - Group  verbal and written instruction with the use of models to demonstrate the basic use of the AED with the basic ABC's of resuscitation.   Cardiac Rehab from 10/31/2018 in [facility] Cardiac and Pulmonary Rehab  Date  10/24/18  Educator  CE  Instruction Review Code  1- Verbalizes Understanding      General Nutrition Guidelines/Fats and Fiber: -Group instruction provided by verbal, written material,  models and posters to present the general guidelines for heart healthy nutrition. Gives an explanation and review of dietary fats and fiber.   Cardiac Rehab from 10/31/2018 in [facility] Cardiac and Pulmonary Rehab  Date  10/29/18  Educator  LB  Instruction Review Code  1- Verbalizes Understanding      Controlling Sodium/Reading Food Labels: -Group verbal and written material supporting the discussion of sodium use in heart healthy nutrition. Review and explanation with models, verbal and written materials for utilization of the food label.   Cardiac Rehab from 10/31/2018 in [facility] Cardiac and Pulmonary Rehab  Date  10/31/18  Educator  LB  Instruction Review Code  1- Verbalizes Understanding      Exercise Physiology & General Exercise Guidelines: - Group verbal and written instruction with models to review the exercise physiology of the cardiovascular system and associated critical values. Provides general exercise guidelines with specific guidelines to those with heart or lung disease.    Cardiac Rehab from 10/31/2018 in [facility] Cardiac and Pulmonary Rehab  Date  09/19/18  Educator  JH  Instruction Review Code  1- Verbalizes Understanding      Aerobic Exercise & Resistance Training: - Gives group verbal and written instruction on the various components of exercise. Focuses on aerobic and resistive training programs and the benefits of this training and how to safely progress through these programs..   Cardiac Rehab from 10/31/2018 in [facility] Cardiac and Pulmonary Rehab  Date  09/24/18  Educator  KH   Instruction Review Code  1- Verbalizes Understanding      Flexibility, Balance, Mind/Body Relaxation: Provides group verbal/written instruction on the benefits of flexibility and balance training, including mind/body exercise modes such as yoga, pilates and tai chi.  Demonstration and skill practice provided.   Cardiac Rehab from 10/31/2018 in [facility] Cardiac and Pulmonary Rehab  Date  10/01/18  Educator  AS  Instruction Review Code  1- Verbalizes Understanding      Stress and Anxiety: - Provides group verbal and written instruction about the health risks of elevated stress and causes of high stress.  Discuss the correlation between heart/lung disease and anxiety and treatment options. Review healthy ways to manage with stress and anxiety.   Cardiac Rehab from 10/31/2018 in [facility] Cardiac and Pulmonary Rehab  Date  10/10/18  Educator  Kathy Clayton, MSW  Instruction Review Code  1- Verbalizes Understanding      Depression: - Provides group verbal and written instruction on the correlation between heart/lung disease and depressed mood, treatment options, and the stigmas associated with seeking treatment.   Anatomy & Physiology of the Heart: - Group verbal and written instruction and models provide basic cardiac anatomy and physiology, with the coronary electrical and arterial systems. Review of Valvular disease and Heart Failure   Cardiac Rehab from 10/31/2018 in [facility] Cardiac and Pulmonary Rehab  Date  10/08/18  Educator  MA  Instruction Review Code  1- Verbalizes Understanding      Cardiac Procedures: - Group verbal and written instruction to review commonly prescribed medications for heart disease. Reviews the medication, class of the drug, and side effects. Includes the steps to properly store meds and maintain the prescription regimen. (beta blockers and nitrates)   Cardiac Rehab from 10/31/2018 in [facility] Cardiac and Pulmonary Rehab  Date  10/22/18  Educator  CE  Instruction Review  Code  1- Verbalizes Understanding      Cardiac Medications I: - Group verbal and written instruction to review commonly prescribed medications for   heart disease. Reviews the medication, class of the drug, and side effects. Includes the steps to properly store meds and maintain the prescription regimen.   Cardiac Rehab from 10/31/2018 in [facility] Cardiac and Pulmonary Rehab  Date  10/15/18  Educator  CE  Instruction Review Code  1- Verbalizes Understanding      Cardiac Medications II: -Group verbal and written instruction to review commonly prescribed medications for heart disease. Reviews the medication, class of the drug, and side effects. (all other drug classes)   Cardiac Rehab from 10/31/2018 in [facility] Cardiac and Pulmonary Rehab  Date  10/03/18  Educator  C.Enterkin, RN  Instruction Review Code  1- Verbalizes Understanding       Go Sex-Intimacy & Heart Disease, Get SMART - Goal Setting: - Group verbal and written instruction through game format to discuss heart disease and the return to sexual intimacy. Provides group verbal and written material to discuss and apply goal setting through the application of the S.M.A.R.T. Method.   Cardiac Rehab from 10/31/2018 in [facility] Cardiac and Pulmonary Rehab  Date  10/22/18  Educator  CE  Instruction Review Code  1- Verbalizes Understanding      Other Matters of the Heart: - Provides group verbal, written materials and models to describe Stable Angina and Peripheral Artery. Includes description of the disease process and treatment options available to the cardiac patient.   Exercise & Equipment Safety: - Individual verbal instruction and demonstration of equipment use and safety with use of the equipment.   Cardiac Rehab from 10/31/2018 in [facility] Cardiac and Pulmonary Rehab  Date  09/10/18  Educator  [facility]  Instruction Review Code  1- Verbalizes Understanding      Infection Prevention: - Provides verbal and written material to individual with  discussion of infection control including proper hand washing and proper equipment cleaning during exercise session.   Cardiac Rehab from 10/31/2018 in [facility] Cardiac and Pulmonary Rehab  Date  09/10/18  Educator  [facility]  Instruction Review Code  1- Verbalizes Understanding      Falls Prevention: - Provides verbal and written material to individual with discussion of falls prevention and safety.   Cardiac Rehab from 10/31/2018 in [facility] Cardiac and Pulmonary Rehab  Date  09/10/18  Educator  [facility]  Instruction Review Code  1- Verbalizes Understanding      Diabetes: - Individual verbal and written instruction to review signs/symptoms of diabetes, desired ranges of glucose level fasting, after meals and with exercise. Acknowledge that pre and post exercise glucose checks will be done for 3 sessions at entry of program.   Cardiac Rehab from 10/31/2018 in [facility] Cardiac and Pulmonary Rehab  Date  09/10/18  Educator  [facility]  Instruction Review Code  1- Verbalizes Understanding      Know Your Numbers and Risk Factors: -Group verbal and written instruction about important numbers in your health.  Discussion of what are risk factors and how they play a role in the disease process.  Review of Cholesterol, Blood Pressure, Diabetes, and BMI and the role they play in your overall health.   Cardiac Rehab from 10/31/2018 in [facility] Cardiac and Pulmonary Rehab  Date  10/03/18  Educator  C.Enterkin, RN  Instruction Review Code  1- [employer] Understanding      Sleep Hygiene: -Provides group verbal and written instruction about how sleep can affect your health.  Define sleep hygiene, discuss sleep cycles and impact of sleep habits. Review good sleep hygiene tips.    Other: -Provides group  and verbal instruction on various topics (see comments)   Knowledge Questionnaire Score: Knowledge Questionnaire Score - 10/29/18 0908      Knowledge Questionnaire Score   Pre Score  22/26    Post Score  24/26   reviewed  with pt      Core Components/Risk Factors/Patient Goals at Admission: Personal Goals and Risk Factors at Admission - 09/10/18 1411      Core Components/Risk Factors/Patient Goals on Admission    Weight Management  Yes;Weight Loss    Intervention  Weight Management: Develop a combined nutrition and exercise program designed to reach desired caloric intake, while maintaining appropriate intake of nutrient and fiber, sodium and fats, and appropriate energy expenditure required for the weight goal.;Weight Management: Provide education and appropriate resources to help participant work on and attain dietary goals.;Weight Management/Obesity: Establish reasonable short term and long term weight goals.    Admit Weight  221 lb 6.4 oz (100.4 kg)    Goal Weight: Short Term  217 lb (98.4 kg)    Goal Weight: Long Term  200 lb (90.7 kg)    Expected Outcomes  Short Term: Continue to assess and modify interventions until short term weight is achieved;Long Term: Adherence to nutrition and physical activity/exercise program aimed toward attainment of established weight goal;Weight Loss: Understanding of general recommendations for a balanced deficit meal plan, which promotes 1-2 lb weight loss per week and includes a negative energy balance of 500-1000 kcal/d;Understanding recommendations for meals to include 15-35% energy as protein, 25-35% energy from fat, 35-60% energy from carbohydrates, less than 200mg of dietary cholesterol, 20-35 gm of total fiber daily;Understanding of distribution of calorie intake throughout the day with the consumption of 4-5 meals/snacks    Diabetes  Yes    Intervention  Provide education about signs/symptoms and action to take for hypo/hyperglycemia.;Provide education about proper nutrition, including hydration, and aerobic/resistive exercise prescription along with prescribed medications to achieve blood glucose in normal ranges: Fasting glucose 65-99 mg/dL    Expected Outcomes  Short  Term: Participant verbalizes understanding of the signs/symptoms and immediate care of hyper/hypoglycemia, proper foot care and importance of medication, aerobic/resistive exercise and nutrition plan for blood glucose control.;Long Term: Attainment of HbA1C < 7%.    Hypertension  Yes    Intervention  Provide education on lifestyle modifcations including regular physical activity/exercise, weight management, moderate sodium restriction and increased consumption of fresh fruit, vegetables, and low fat dairy, alcohol moderation, and smoking cessation.;Monitor prescription use compliance.    Expected Outcomes  Short Term: Continued assessment and intervention until BP is < 140/90mm HG in hypertensive participants. < 130/80mm HG in hypertensive participants with diabetes, heart failure or chronic kidney disease.;Long Term: Maintenance of blood pressure at goal levels.       Core Components/Risk Factors/Patient Goals Review:  Goals and Risk Factor Review    Row Name 10/15/18 1700             Core Components/Risk Factors/Patient Goals Review   Personal Goals Review  Weight Management/Obesity;Lipids;Hypertension;Stress;Diabetes       Review  Steve is taking all meds as directed.  He is maintaining current weight.  He can tell his clothes fit better.  He added interval training to exercise last week and likes the challenge of reaching higher levels.  His family is moving to a new home soon and he is excited to have a new kitchen set up like he wants to cook.  He is also getting a recumbent bike to use at   home.       Expected Outcomes  Short - continue to attend HT Long - maintain exercise on hi sown          Core Components/Risk Factors/Patient Goals at Discharge (Final Review):  Goals and Risk Factor Review - 10/15/18 1700      Core Components/Risk Factors/Patient Goals Review   Personal Goals Review  Weight Management/Obesity;Lipids;Hypertension;Stress;Diabetes    Review  [name] is taking all meds  as directed.  He is maintaining current weight.  He can tell his clothes fit better.  He added interval training to exercise last week and likes the challenge of reaching higher levels.  His family is moving to a new home soon and he is excited to have a new kitchen set up like he wants to cook.  He is also getting a recumbent bike to use at home.    Expected Outcomes  Short - continue to attend HT Long - maintain exercise on hi sown       ITP Comments: ITP Comments    Row Name 09/10/18 1400 10/03/18 0650 10/31/18 0541 10/31/18 0802     ITP Comments  Med Review completed. Initial ITP created. Diagnosis can be found in [facility] 8/28  30 day review.  Continue with ITP unless directed changes per Medical Director review.  30 day review. Continue with ITP unless direccted changes per Medical Director Chart Review.  Discharge ITP sent and signed by Dr. Sabra Heck.  Discharge Summary routed to PCP and cardiologist.       Comments: Discharge ITP

## 2018-11-06 ENCOUNTER — Encounter: Payer: Self-pay | Admitting: Family Medicine

## 2018-11-13 ENCOUNTER — Other Ambulatory Visit: Payer: Self-pay

## 2018-11-13 ENCOUNTER — Ambulatory Visit
Payer: 59 | Attending: Student in an Organized Health Care Education/Training Program | Admitting: Student in an Organized Health Care Education/Training Program

## 2018-11-13 ENCOUNTER — Other Ambulatory Visit: Payer: Self-pay | Admitting: Family Medicine

## 2018-11-13 ENCOUNTER — Encounter: Payer: Self-pay | Admitting: Student in an Organized Health Care Education/Training Program

## 2018-11-13 VITALS — BP 170/122 | HR 60 | Temp 98.4°F | Resp 18 | Ht 71.0 in | Wt 212.0 lb

## 2018-11-13 DIAGNOSIS — Z79891 Long term (current) use of opiate analgesic: Secondary | ICD-10-CM | POA: Insufficient documentation

## 2018-11-13 DIAGNOSIS — R779 Abnormality of plasma protein, unspecified: Secondary | ICD-10-CM

## 2018-11-13 DIAGNOSIS — M47816 Spondylosis without myelopathy or radiculopathy, lumbar region: Secondary | ICD-10-CM | POA: Diagnosis not present

## 2018-11-13 DIAGNOSIS — M5136 Other intervertebral disc degeneration, lumbar region: Secondary | ICD-10-CM

## 2018-11-13 DIAGNOSIS — I252 Old myocardial infarction: Secondary | ICD-10-CM | POA: Insufficient documentation

## 2018-11-13 DIAGNOSIS — R748 Abnormal levels of other serum enzymes: Secondary | ICD-10-CM

## 2018-11-13 DIAGNOSIS — M5030 Other cervical disc degeneration, unspecified cervical region: Secondary | ICD-10-CM | POA: Diagnosis not present

## 2018-11-13 DIAGNOSIS — E119 Type 2 diabetes mellitus without complications: Secondary | ICD-10-CM

## 2018-11-13 DIAGNOSIS — Z888 Allergy status to other drugs, medicaments and biological substances status: Secondary | ICD-10-CM | POA: Diagnosis not present

## 2018-11-13 DIAGNOSIS — D582 Other hemoglobinopathies: Secondary | ICD-10-CM

## 2018-11-13 DIAGNOSIS — I700 Atherosclerosis of aorta: Secondary | ICD-10-CM

## 2018-11-13 DIAGNOSIS — M545 Low back pain: Secondary | ICD-10-CM | POA: Diagnosis not present

## 2018-11-13 DIAGNOSIS — I2510 Atherosclerotic heart disease of native coronary artery without angina pectoris: Secondary | ICD-10-CM

## 2018-11-13 DIAGNOSIS — R17 Unspecified jaundice: Secondary | ICD-10-CM

## 2018-11-13 DIAGNOSIS — E785 Hyperlipidemia, unspecified: Secondary | ICD-10-CM | POA: Diagnosis not present

## 2018-11-13 DIAGNOSIS — G8929 Other chronic pain: Secondary | ICD-10-CM

## 2018-11-13 DIAGNOSIS — Z794 Long term (current) use of insulin: Secondary | ICD-10-CM | POA: Diagnosis not present

## 2018-11-13 DIAGNOSIS — M25511 Pain in right shoulder: Secondary | ICD-10-CM

## 2018-11-13 DIAGNOSIS — Z87891 Personal history of nicotine dependence: Secondary | ICD-10-CM | POA: Diagnosis not present

## 2018-11-13 DIAGNOSIS — Z981 Arthrodesis status: Secondary | ICD-10-CM | POA: Insufficient documentation

## 2018-11-13 DIAGNOSIS — Z5181 Encounter for therapeutic drug level monitoring: Secondary | ICD-10-CM | POA: Insufficient documentation

## 2018-11-13 DIAGNOSIS — Z79899 Other long term (current) drug therapy: Secondary | ICD-10-CM | POA: Diagnosis not present

## 2018-11-13 DIAGNOSIS — G894 Chronic pain syndrome: Secondary | ICD-10-CM

## 2018-11-13 DIAGNOSIS — M1A9XX0 Chronic gout, unspecified, without tophus (tophi): Secondary | ICD-10-CM

## 2018-11-13 DIAGNOSIS — Z7982 Long term (current) use of aspirin: Secondary | ICD-10-CM | POA: Diagnosis not present

## 2018-11-13 DIAGNOSIS — Z9889 Other specified postprocedural states: Secondary | ICD-10-CM | POA: Diagnosis not present

## 2018-11-13 DIAGNOSIS — Z8249 Family history of ischemic heart disease and other diseases of the circulatory system: Secondary | ICD-10-CM | POA: Insufficient documentation

## 2018-11-13 DIAGNOSIS — M51369 Other intervertebral disc degeneration, lumbar region without mention of lumbar back pain or lower extremity pain: Secondary | ICD-10-CM

## 2018-11-13 MED ORDER — BUPRENORPHINE HCL 450 MCG BU FILM: 450.0000 ug | ORAL_FILM | Freq: Two times a day (BID) | BUCCAL | 0 refills | Status: DC

## 2018-11-13 NOTE — Progress Notes (Signed)
Nursing Pain Medication Assessment:  Safety precautions to be maintained throughout the outpatient stay will include: orient to surroundings, keep bed in low position, maintain call bell within reach at all times, provide assistance with transfer out of bed and ambulation.  Medication Inspection Compliance: John Hartman did not comply with our request to bring his pills to be counted. He was reminded that bringing the medication bottles, even when empty, is a requirement.  Medication: None brought in. Pill/Patch Count: None available to be counted. Bottle Appearance: No container available. Did not bring bottle(s) to appointment. Filled Date: N/A Last Medication intake:  Yesterday

## 2018-11-13 NOTE — Progress Notes (Signed)
Lab orders

## 2018-11-13 NOTE — Patient Instructions (Addendum)
[place] to last 1 month has been escribed to your pharmacy.

## 2018-11-13 NOTE — Progress Notes (Signed)
Patient's Name: John Hartman  MRN: [id number]  Referring Provider: Arnetha Courser, MD  DOB: [birth year]  PCP: Arnetha Courser, MD  DOS: 11/13/2018  Note by: Gillis Santa, MD  Service setting: Ambulatory outpatient  Specialty: Interventional Pain Management  Location: [facility] (AMB) Pain Management Facility    Patient type: Established   Primary Reason(s) for Visit: Encounter for prescription drug management. (Level of risk: moderate)  CC: Back Pain (lower) and Shoulder Pain (right)  HPI  John Hartman is a 47 y.o. year old, male patient, who comes today for a medication management evaluation. He has Essential hypertension; Gout; IBS (irritable bowel syndrome); Family history of malignant neoplasm of gastrointestinal tract; Benign neoplasm of rectosigmoid junction; Palpitations; Decongestant abuse; Ketonuria; Glucosuria; Preventative health care; Chest pain; Diabetes mellitus ([place]); Encounter for medication monitoring; Coronary artery disease; Angioedema; Hyperlipidemia LDL goal <70; Elevated serum glutamic pyruvic transaminase (SGPT) level; Elevated liver enzymes; Serum total bilirubin elevated; Fatty liver; Chronic lower back pain; Degenerative disc disease, lumbar; Arthritis, lumbar spine; Calcification of abdominal aorta ([place]); Elevated hemoglobin ([place]); and NSTEMI (non-ST elevated myocardial infarction) ([place]) on their problem list. His primarily concern today is the Back Pain (lower) and Shoulder Pain (right)  Pain Assessment: Location: Right, Left, Lower Back Radiating: to hips bilaterally "when I do too much" Onset: More than a month ago Duration: Chronic pain Quality: Aching, Constant, Sharp Severity: 7 /10 (subjective, self-reported pain score)  Note: Reported level is compatible with observation.                         When using our objective Pain Scale, levels between 6 and 10/10 are said to belong in an emergency room, as it progressively worsens from a 6/10, described as severely limiting,  requiring emergency care not usually available at an outpatient pain management facility. At a 6/10 level, communication becomes difficult and requires great effort. Assistance to reach the emergency department may be required. Facial flushing and profuse sweating along with potentially dangerous increases in heart rate and blood pressure will be evident. Effect on ADL: limits daily activities Timing: Constant Modifying factors: meds, heating pad BP: (!) 170/122  HR: 60  John Hartman was last scheduled for an appointment on 09/18/2018 for medication management. During today's appointment we reviewed John Hartman chronic pain status, as well as his outpatient medication regimen.  Patient has completed cardiac rehab.  He states this was very difficult.  He is having worsening low back pain as well as new onset right shoulder pain.  He does find mild benefit with belbuca.  No side effects.  Still having difficulty sleeping and waking up in the middle the night and pain.  We discussed going up on his belbuca dose.  The patient  reports that he does not use drugs. His body mass index is 29.57 kg/m.  Further details on both, my assessment(s), as well as the proposed treatment plan, please see below.  Controlled Substance Pharmacotherapy Assessment REMS (Risk Evaluation and Mitigation Strategy)  Analgesic: Belbuca 150 mcg twice daily Rise Patience, RN  11/13/2018  8:45 AM  Signed Nursing Pain Medication Assessment:  Safety precautions to be maintained throughout the outpatient stay will include: orient to surroundings, keep bed in low position, maintain call bell within reach at all times, provide assistance with transfer out of bed and ambulation.  Medication Inspection Compliance: John Hartman did not comply with our request to bring his pills to be counted. He was reminded that bringing  the medication bottles, even when empty, is a requirement.  Medication: None brought in. Pill/Patch Count: None  available to be counted. Bottle Appearance: No container available. Did not bring bottle(s) to appointment. Filled Date: N/A Last Medication intake:  Yesterday   Pharmacokinetics: Liberation and absorption (onset of action): WNL Distribution (time to peak effect): WNL Metabolism and excretion (duration of action): WNL         Pharmacodynamics: Desired effects: Analgesia: [name] reports >50% benefit. Functional ability: Patient reports that medication allows him to accomplish basic ADLs Clinically meaningful improvement in function (CMIF): Sustained CMIF goals met Perceived effectiveness: Described as relatively effective, allowing for increase in activities of daily living (ADL) Undesirable effects: Side-effects or Adverse reactions: None reported Monitoring: [place] PMP: Online review of the past 44-monthperiod conducted. Compliant with practice rules and regulations Last UDS on record: No results found for: SUMMARY UDS interpretation: Compliant          Medication Assessment Form: Reviewed. Patient indicates being compliant with therapy Treatment compliance: Compliant Risk Assessment Profile: Aberrant behavior: See prior evaluations. None observed or detected today Comorbid factors increasing risk of overdose: See prior notes. No additional risks detected today Opioid risk tool (ORT) (Total Score): 0 Personal History of Substance Abuse (SUD-Substance use disorder):  Alcohol: Negative  Illegal Drugs: Negative  Rx Drugs: Negative  ORT Risk Level calculation: Low Risk Risk of substance use disorder (SUD): Low Opioid Risk Tool - 11/13/18 0900      Family History of Substance Abuse   Alcohol  Negative    Illegal Drugs  Negative    Rx Drugs  Negative      Personal History of Substance Abuse   Alcohol  Negative    Illegal Drugs  Negative    Rx Drugs  Negative      Age   Age between 182-45years   No      History of Preadolescent Sexual Abuse   History of Preadolescent  Sexual Abuse  Negative or Male      Psychological Disease   Psychological Disease  Negative    Depression  Negative      Total Score   Opioid Risk Tool Scoring  0    Opioid Risk Interpretation  Low Risk      ORT Scoring interpretation table:  Score <3 = Low Risk for SUD  Score between 4-7 = Moderate Risk for SUD  Score >8 = High Risk for Opioid Abuse   Risk Mitigation Strategies:  Patient Counseling: Covered Patient-Prescriber Agreement (PPA): Present and active  Notification to other healthcare providers: Done  Pharmacologic Plan: Increase belbuca to 450 mcg twice daily given suboptimal analgesic effect             Laboratory Chemistry  Inflammation Markers (CRP: Acute Phase) (ESR: Chronic Phase) No results found for: CRP, ESRSEDRATE, LATICACIDVEN                       Rheumatology Markers Lab Results  Component Value Date   RF <14 08/04/2017   ANA Negative 09/21/2017   LABURIC 6.4 08/04/2017                        Renal Function Markers Lab Results  Component Value Date   BUN 17 10/17/2018   CREATININE 1.04 114/27/6701  BCR NOT APPLICABLE 010/02/4960  GFRAA >60 10/17/2018   GFRNONAA >60 10/17/2018  Hepatic Function Markers Lab Results  Component Value Date   AST 37 10/17/2018   ALT 42 10/17/2018   ALBUMIN 4.8 10/17/2018   ALKPHOS 89 10/17/2018                        Electrolytes Lab Results  Component Value Date   NA 142 10/17/2018   K 3.9 10/17/2018   CL 103 10/17/2018   CALCIUM 9.4 10/17/2018   MG 2.0 08/27/2016   PHOS 1.9 (L) 08/27/2016                        Neuropathy Markers Lab Results  Component Value Date   HGBA1C 7.1 (A) 08/10/2018   HIV Non Reactive 08/23/2018                        CNS Tests No results found for: COLORCSF, APPEARCSF, RBCCOUNTCSF, WBCCSF, POLYSCSF, LYMPHSCSF, EOSCSF, PROTEINCSF, GLUCCSF, JCVIRUS, CSFOLI, IGGCSF                      Bone Pathology Markers Lab Results  Component Value  Date   VD25OH 30 08/04/2017                         Coagulation Parameters Lab Results  Component Value Date   INR 1.11 08/22/2018   LABPROT 14.2 08/22/2018   APTT 116 (H) 08/22/2018   PLT 220 10/17/2018                        Cardiovascular Markers Lab Results  Component Value Date   TROPONINI 16.49 ([place]) 08/23/2018   HGB 15.6 10/17/2018   HCT 48.3 10/17/2018                         CA Markers No results found for: CEA, CA125, LABCA2                      Note: Lab results reviewed.  Recent Diagnostic Imaging Results  ECHOCARDIOGRAM COMPLETE                 *[place], [place] [address]                            [phone]  ------------------------------------------------------------------- Transthoracic Echocardiography  Patient:    Matthewjames, Petrasek MR #:       [id number] Study Date: 08/24/2018 Gender:     M Age:        5 Height:     180.3 cm Weight:     104.3 kg BSA:        2.32 m^2 Pt. Status: Room:   PERFORMING   [facility]  SONOGRAPHER  Charmayne Sheer, RDCS  ATTENDING    Hortencia Conradi  REFERRING    Doristine Mango L  cc:  ------------------------------------------------------------------- LV EF: 35% -   40%  ------------------------------------------------------------------- Indications:      CAD Native  Vessel.  ------------------------------------------------------------------- History:   Risk factors:  Hypertension. Diabetes mellitus.  ------------------------------------------------------------------- Study Conclusions  - Left ventricle: Wall thickness was increased in a pattern of   moderate LVH. Systolic function was moderately reduced. The   estimated ejection fraction was in the range of 35% to 40%.   Hypokinesis of the inferior myocardium. Hypokinesis of the   inferolateral myocardium. - Aortic valve:  Valve area (VTI): 3.35 cm^2. Valve area (Vmax):   3.05 cm^2. Valve area (Vmean): 3.29 cm^2. - Mitral valve: Valve area by continuity equation (using LVOT   flow): 3.53 cm^2.  ------------------------------------------------------------------- Study data:   Study status:  Routine.  Procedure:  The patient reported no pain pre or post test. Transthoracic echocardiography. Image quality was suboptimal. The study was technically difficult, as a result of poor sound wave transmission and body habitus.    Transthoracic echocardiography.  M-mode, complete 2D, spectral Doppler, and color Doppler.  Birthdate:  Patient birthdate: [birth year].  Age:  Patient is 47 yr old.  Sex:  Gender: male. BMI: 32.1 kg/m^2.  Blood pressure:     146/109  Patient status: Inpatient.  Study date:  Study date: 08/24/2018. Study time: 02:43 PM.  Location:  Bedside.  -------------------------------------------------------------------  ------------------------------------------------------------------- Left ventricle:   Wall thickness was increased in a pattern of moderate LVH.   Systolic function was moderately reduced. The estimated ejection fraction was in the range of 35% to 40%. Regional wall motion abnormalities:   Hypokinesis of the inferior myocardium.  Hypokinesis of the inferolateral myocardium.  ------------------------------------------------------------------- Aortic valve:   [place]let.  Doppler:   There was no stenosis. VTI ratio of LVOT to aortic valve: 0.88. Valve area (VTI): 3.35 cm^2. Indexed valve area (VTI): 1.45 cm^2/m^2. Peak velocity ratio of LVOT to aortic valve: 0.8. Valve area (Vmax): 3.05 cm^2. Indexed valve area (Vmax): 1.32 cm^2/m^2. Mean velocity ratio of LVOT to aortic valve: 0.87. Valve area (Vmean): 3.29 cm^2. Indexed valve area (Vmean): 1.42 cm^2/m^2.    Mean gradient (S): 2 mm Hg. Peak gradient (S): 4 mm  Hg.  ------------------------------------------------------------------- Mitral valve:  Poorly visualized.  Doppler:  There was trivial regurgitation.    Valve area by pressure half-time: 4.4 cm^2. Indexed valve area by pressure half-time: 1.9 cm^2/m^2. Valve area by continuity equation (using LVOT flow): 3.53 cm^2. Indexed valve area by continuity equation (using LVOT flow): 1.52 cm^2/m^2. Mean gradient (D): 1 mm Hg.  ------------------------------------------------------------------- Left atrium:  The atrium was normal in size.  ------------------------------------------------------------------- Right ventricle:  The cavity size was normal. Wall thickness was normal. Systolic function was normal.  ------------------------------------------------------------------- Tricuspid valve:  Poorly visualized.  Doppler:  There was trivial regurgitation.  ------------------------------------------------------------------- Pericardium:  Not well visualized.  ------------------------------------------------------------------- Measurements   Left ventricle                            Value          Reference  LV ID, ED, PLAX chordal           (L)     36    mm       43 - 52  LV ID, ES, PLAX chordal                   30.6  mm       23 - 38  LV fx shortening, PLAX chordal    (L)     15    %        >=  29  LV PW thickness, ED                       13.5  mm       ---------  IVS/LV PW ratio, ED                       0.92           <=1.3  Stroke volume, 2D                         45    ml       ---------  Stroke volume/bsa, 2D                     19    ml/m^2   ---------  LV ejection fraction, 1-p A4C             50    %        ---------  LV end-diastolic volume, 2-p              67    ml       ---------  LV end-systolic volume, 2-p               40    ml       ---------  LV ejection fraction, 2-p                 41    %        ---------  Stroke volume, 2-p                        27    ml        ---------  LV end-diastolic volume/bsa, 2-p          29    ml/m^2   ---------  LV end-systolic volume/bsa, 2-p           17    ml/m^2   ---------  Stroke volume/bsa, 2-p                    11.8  ml/m^2   ---------  LV e&', lateral                            3.81  cm/s     ---------  LV E/e&', lateral                          10.58          ---------  LV e&', medial                             4.03  cm/s     ---------  LV E/e&', medial                           10             ---------  LV e&', average                            3.92  cm/s     ---------  LV E/e&', average  10.28          ---------    Ventricular septum                        Value          Reference  IVS thickness, ED                         12.4  mm       ---------    LVOT                                      Value          Reference  LVOT ID, S                                22    mm       ---------  LVOT area                                 3.8   cm^2     ---------  LVOT peak velocity, S                     79.1  cm/s     ---------  LVOT mean velocity, S                     57    cm/s     ---------  LVOT VTI, S                               11.8  cm       ---------    Aortic valve                              Value          Reference  Aortic valve peak velocity, S             98.6  cm/s     ---------  Aortic valve mean velocity, S             65.8  cm/s     ---------  Aortic valve VTI, S                       13.4  cm       ---------  Aortic mean gradient, S                   2     mm Hg    ---------  Aortic peak gradient, S                   4     mm Hg    ---------  VTI ratio, LVOT/AV                        0.88           ---------  Aortic valve area, VTI  3.35  cm^2     ---------  Aortic valve area/bsa, VTI                1.45  cm^2/m^2 ---------  Velocity ratio, peak, LVOT/AV             0.8            ---------  Aortic valve area, peak velocity          3.05  cm^2      ---------  Aortic valve area/bsa, peak               1.32  cm^2/m^2 ---------  velocity  Velocity ratio, mean, LVOT/AV             0.87           ---------  Aortic valve area, mean velocity          3.29  cm^2     ---------  Aortic valve area/bsa, mean               1.42  cm^2/m^2 ---------  velocity    Aorta                                     Value          Reference  Aortic root ID, ED                        36    mm       ---------    Left atrium                               Value          Reference  LA ID, A-P, ES                            37    mm       ---------  LA ID/bsa, A-P                            1.6   cm/m^2   <=2.2  LA volume, S                              35.3  ml       ---------  LA volume/bsa, S                          15.2  ml/m^2   ---------  LA volume, ES, 1-p A4C                    30.9  ml       ---------  LA volume/bsa, ES, 1-p A4C                13.3  ml/m^2   ---------  LA volume, ES, 1-p A2C                    35.5  ml       ---------  LA volume/bsa, ES, 1-p A2C  15.3  ml/m^2   ---------    Mitral valve                              Value          Reference  Mitral E-wave peak velocity               40.3  cm/s     ---------  Mitral A-wave peak velocity               82.9  cm/s     ---------  Mitral mean velocity, D                   44.3  cm/s     ---------  Mitral deceleration time                  169   ms       150 - 230  Mitral pressure half-time                 50    ms       ---------  Mitral mean gradient, D                   1     mm Hg    ---------  Mitral E/A ratio, peak                    0.5            ---------  Mitral valve area, PHT, DP                4.4   cm^2     ---------  Mitral valve area/bsa, PHT, DP            1.9   cm^2/m^2 ---------  Mitral valve area, LVOT                   3.53  cm^2     ---------  continuity  Mitral valve area/bsa, LVOT               1.52  cm^2/m^2 ---------  continuity  Mitral annulus VTI, D                      12.7  cm       ---------    Right ventricle                           Value          Reference  RV ID, minor axis, ED, A4C base           24    mm       ---------    Pulmonic valve                            Value          Reference  Pulmonic valve peak velocity, S           88.8  cm/s     ---------  Pulmonic acceleration time                55    ms       ---------  Legend: (L)  and  (H)  mark values outside  specified reference range.  ------------------------------------------------------------------- Prepared and Electronically Authenticated by  Bartholome Bill, MD 2019-08-30T15:39:06  Complexity Note: Imaging results reviewed. Results shared with John Hartman, using Layman's terms.                         Meds   Current Outpatient Medications:  .  ACCU-CHEK SOFTCLIX LANCETS lancets, USE AS DIRECTED, Disp: 100 each, Rfl: 0 .  allopurinol (ZYLOPRIM) 100 MG tablet, Take 100 mg by mouth every morning. , Disp: , Rfl:  .  aspirin EC 81 MG tablet, Take 81 mg by mouth daily., Disp: , Rfl:  .  [place] alk-PHENObarbital (DONNATAL) 16.2 MG tablet, Take 1 tablet by mouth as needed (pain)., Disp: , Rfl:  .  cetirizine (ZYRTEC) 10 MG tablet, Take 10 mg by mouth at bedtime. , Disp: , Rfl:  .  colchicine 0.6 MG tablet, Take 0.6 mg by mouth daily as needed (gout). , Disp: , Rfl:  .  Dapagliflozin-metFORMIN HCl ER (XIGDUO XR) 04-999 MG TB24, Take 10-200 mg by mouth daily., Disp: , Rfl:  .  diphenhydrAMINE (BENADRYL) 25 mg capsule, Take 50 mg by mouth at bedtime. , Disp: , Rfl:  .  EPINEPHrine (EPIPEN 2-PAK) 0.3 mg/0.3 mL IJ SOAJ injection, Inject 0.3 mg into the muscle once., Disp: , Rfl:  .  glucose blood (ACCU-CHEK ACTIVE STRIPS) test strip, Use as instructed, Disp: 100 each, Rfl: 12 .  glucose blood (ACCU-CHEK AVIVA PLUS) test strip, Check FSBS three to four times per day, Disp: 100 each, Rfl: 1 .  hydrochlorothiazide (HYDRODIURIL) 12.5 MG tablet, Take 12.5 mg by mouth daily.,  Disp: , Rfl:  .  indomethacin (INDOCIN) 25 MG capsule, Take 25 mg by mouth 2 (two) times daily as needed for mild pain., Disp: , Rfl:  .  Insulin Degludec (TRESIBA FLEXTOUCH) 100 UNIT/ML SOPN, Inject 34 Units into the skin at bedtime. , Disp: 1 pen, Rfl: 0 .  Insulin Pen Needle (B-D UF III MINI PEN NEEDLES) 31G X 5 MM MISC, Once a day with insulin, Disp: 50 each, Rfl: 3 .  Melatonin 5 MG TABS, Take 5 mg by mouth at bedtime. , Disp: , Rfl:  .  metoprolol tartrate (LOPRESSOR) 50 MG tablet, Take 1 tablet (50 mg total) by mouth 2 (two) times daily. (Patient taking differently: Take 25 mg by mouth 2 (two) times daily. ), Disp: 60 tablet, Rfl: 0 .  nitroGLYCERIN (NITROSTAT) 0.4 MG SL tablet, Place 1 tablet (0.4 mg total) under the tongue every 5 (five) minutes as needed for chest pain., Disp: 30 tablet, Rfl: 0 .  pregabalin (LYRICA) 50 MG capsule, Take 1 capsule (50 mg total) by mouth 3 (three) times daily., Disp: 90 capsule, Rfl: 2 .  ticagrelor (BRILINTA) 90 MG TABS tablet, Take 90 mg by mouth 2 (two) times daily., Disp: , Rfl:  .  tiZANidine (ZANAFLEX) 4 MG tablet, Take 1 tablet (4 mg total) by mouth every 8 (eight) hours as needed for muscle spasms., Disp: 90 tablet, Rfl: 5 .  atorvastatin (LIPITOR) 80 MG tablet, Take 1 tablet (80 mg total) by mouth daily at 6 PM., Disp: 30 tablet, Rfl: 0 .  [START ON 11/16/2018] Buprenorphine HCl (BELBUCA) 450 MCG FILM, Place 450 mcg inside cheek 2 (two) times daily., Disp: 60 each, Rfl: 0 .  Dapagliflozin-metFORMIN HCl ER (XIGDUO XR) 04-999 MG TB24, Take 2 tablets by mouth daily. , Disp: , Rfl:  .  hydrALAZINE (APRESOLINE) 25 MG tablet, Take  1 tablet (25 mg total) by mouth 2 (two) times daily., Disp: 60 tablet, Rfl: 0 .  oxyCODONE-acetaminophen (PERCOCET) 7.5-325 MG tablet, Take 1 tablet by mouth 3 (three) times daily as needed for severe pain. (Patient not taking: Reported on 11/13/2018), Disp: 90 tablet, Rfl: 0 .  ranitidine (ZANTAC) 150 MG capsule, Take 150 mg by  mouth at bedtime. , Disp: , Rfl:   ROS  Constitutional: Denies any fever or chills Gastrointestinal: No reported hemesis, hematochezia, vomiting, or acute GI distress Musculoskeletal: Denies any acute onset joint swelling, redness, loss of ROM, or weakness Neurological: No reported episodes of acute onset apraxia, aphasia, dysarthria, agnosia, amnesia, paralysis, loss of coordination, or loss of consciousness  Allergies  John Hartman is allergic to lisinopril and ace inhibitors.  PFSH  Drug: John Hartman  reports that he does not use drugs. Alcohol:  reports that he does not drink alcohol. Tobacco:  reports that he quit smoking about 3 years ago. His smoking use included cigarettes. He has a 10.00 pack-year smoking history. He has never used smokeless tobacco. Medical:  has a past medical history of Angioedema, Bronchitis, DDD (degenerative disc disease), cervical, Diabetes mellitus ([place]), Fatty liver (03/31/2017), GERD (gastroesophageal reflux disease), Gout, Heart attack ([place]), HTN (hypertension), Hypertension, IBS (irritable bowel syndrome), Myocardial infarction ([place]) (08/23/2018), Seasonal allergies, and Spinal stenosis. Surgical: Mr. Perkin  has a past surgical history that includes Shoulder surgery (Left, 2007); Nasal septum surgery (2004); ACDF with fusion (2007); Testicular torsion (1980s); Vasectomy; Colonoscopy with propofol (N/A, 06/26/2015); Cardiac catheterization (Left, 08/15/2016); Coronary/Graft Acute MI Revascularization (N/A, 08/23/2018); and LEFT HEART CATH AND CORONARY ANGIOGRAPHY (N/A, 08/23/2018). Family: family history includes Allergies in his son; Alzheimer's disease in his maternal grandmother; Arrhythmia in his mother; Breast cancer in his mother; Cancer in his maternal grandfather and mother; Diabetes in his father; Heart disease in his brother; Hypertension in his father; Skin cancer in his mother.  Constitutional Exam  General appearance: Well nourished, well developed, and well  hydrated. In no apparent acute distress Vitals:   11/13/18 0852 11/13/18 0855  BP: (!) 149/101 (!) 170/122  Pulse: 60   Resp: 18   Temp: 98.4 F (36.9 C)   TempSrc: Oral   SpO2: 100%   Weight: 212 lb (96.2 kg)   Height: '5\' 11"'  (1.803 m)    BMI Assessment: Estimated body mass index is 29.57 kg/m as calculated from the following:   Height as of this encounter: '5\' 11"'  (1.803 m).   Weight as of this encounter: 212 lb (96.2 kg).  BMI interpretation table: BMI level Category Range association with higher incidence of chronic pain  <18 kg/m2 Underweight   18.5-24.9 kg/m2 Ideal body weight   25-29.9 kg/m2 Overweight Increased incidence by 20%  30-34.9 kg/m2 Obese (Class I) Increased incidence by 68%  35-39.9 kg/m2 Severe obesity (Class II) Increased incidence by 136%  >40 kg/m2 Extreme obesity (Class III) Increased incidence by 254%   Patient's current BMI Ideal Body weight  Body mass index is 29.57 kg/m. Ideal body weight: 75.3 kg (166 lb 0.1 oz) Adjusted ideal body weight: 83.6 kg (184 lb 6.5 oz)   BMI Readings from Last 4 Encounters:  11/13/18 29.57 kg/m  10/30/18 29.87 kg/m  10/17/18 30.40 kg/m  09/18/18 30.96 kg/m   Wt Readings from Last 4 Encounters:  11/13/18 212 lb (96.2 kg)  10/30/18 214 lb 3.2 oz (97.2 kg)  10/17/18 218 lb (98.9 kg)  09/18/18 222 lb (100.7 kg)  Psych/Mental status: Alert, oriented x  3 (person, place, & time)       Eyes: PERLA Respiratory: No evidence of acute respiratory distress  Cervical Spine Area Exam  Skin & Axial Inspection: No masses, redness, edema, swelling, or associated skin lesions Alignment: Symmetrical Functional ROM: Unrestricted ROM      Stability: No instability detected Muscle Tone/Strength: Functionally intact. No obvious neuro-muscular anomalies detected. Sensory (Neurological): Unimpaired Palpation: No palpable anomalies              Upper Extremity (UE) Exam    Side: Right upper extremity  Side: Left upper extremity   Skin & Extremity Inspection: Skin color, temperature, and hair growth are WNL. No peripheral edema or cyanosis. No masses, redness, swelling, asymmetry, or associated skin lesions. No contractures.  Skin & Extremity Inspection: Skin color, temperature, and hair growth are WNL. No peripheral edema or cyanosis. No masses, redness, swelling, asymmetry, or associated skin lesions. No contractures.  Functional ROM: Decreased ROM for shoulder  Functional ROM: Unrestricted ROM          Muscle Tone/Strength: Functionally intact. No obvious neuro-muscular anomalies detected.  Muscle Tone/Strength: Functionally intact. No obvious neuro-muscular anomalies detected.  Sensory (Neurological): Musculoskeletal pain pattern          Sensory (Neurological): Unimpaired          Palpation: No palpable anomalies              Palpation: No palpable anomalies              Provocative Test(s):  Phalen's test: deferred Tinel's test: deferred Apley's scratch test (touch opposite shoulder):  Action 1 (Across chest): deferred Action 2 (Overhead): deferred Action 3 (LB reach): deferred   Provocative Test(s):  Phalen's test: deferred Tinel's test: deferred Apley's scratch test (touch opposite shoulder):  Action 1 (Across chest): deferred Action 2 (Overhead): deferred Action 3 (LB reach): deferred    Thoracic Spine Area Exam  Skin & Axial Inspection: No masses, redness, or swelling Alignment: Symmetrical Functional ROM: Decreased ROM Stability: No instability detected Muscle Tone/Strength: Functionally intact. No obvious neuro-muscular anomalies detected. Sensory (Neurological): Unimpaired Muscle strength & Tone: No palpable anomalies  Lumbar Spine Area Exam  Skin & Axial Inspection: No masses, redness, or swelling Alignment: Symmetrical Functional ROM: Unrestricted ROM       Stability: No instability detected Muscle Tone/Strength: Functionally intact. No obvious neuro-muscular anomalies detected. Sensory  (Neurological): Articular pain pattern Palpation: No palpable anomalies       Provocative Tests: Hyperextension/rotation test: deferred today       Lumbar quadrant test (Kemp's test): deferred today       Lateral bending test: deferred today       Patrick's Maneuver: deferred today                   FABER test: deferred today                   S-I anterior distraction/compression test: deferred today         S-I lateral compression test: deferred today         S-I Thigh-thrust test: deferred today         S-I Gaenslen's test: deferred today          Gait & Posture Assessment  Ambulation: Unassisted Gait: Relatively normal for age and body habitus Posture: WNL   Lower Extremity Exam    Side: Right lower extremity  Side: Left lower extremity  Stability: No instability observed  Stability: No instability observed          Skin & Extremity Inspection: Skin color, temperature, and hair growth are WNL. No peripheral edema or cyanosis. No masses, redness, swelling, asymmetry, or associated skin lesions. No contractures.  Skin & Extremity Inspection: Skin color, temperature, and hair growth are WNL. No peripheral edema or cyanosis. No masses, redness, swelling, asymmetry, or associated skin lesions. No contractures.  Functional ROM: Unrestricted ROM                  Functional ROM: Unrestricted ROM                  Muscle Tone/Strength: Functionally intact. No obvious neuro-muscular anomalies detected.  Muscle Tone/Strength: Functionally intact. No obvious neuro-muscular anomalies detected.  Sensory (Neurological): Unimpaired medial portion of foot (L4)  Sensory (Neurological): Unimpaired medial portion of foot (L4)  DTR: Patellar: deferred today Achilles: deferred today Plantar: deferred today  DTR: Patellar: deferred today Achilles: deferred today Plantar: deferred today  Palpation: No palpable anomalies  Palpation: No palpable anomalies   Assessment  Primary Diagnosis &  Pertinent Problem List: The primary encounter diagnosis was Chronic pain syndrome. Diagnoses of Lumbar facet arthropathy, Spondylosis of lumbar region without myelopathy or radiculopathy, Degenerative disc disease, lumbar, and Chronic right shoulder pain were also pertinent to this visit.  Status Diagnosis  Controlled Persistent Persistent 1. Chronic pain syndrome   2. Lumbar facet arthropathy   3. Spondylosis of lumbar region without myelopathy or radiculopathy   4. Degenerative disc disease, lumbar   5. Chronic right shoulder pain     General Recommendations: The pain condition that the patient suffers from is best treated with a multidisciplinary approach that involves an increase in physical activity to prevent de-conditioning and worsening of the pain cycle, as well as psychological counseling (formal and/or informal) to address the co-morbid psychological affects of pain. Treatment will often involve judicious use of pain medications and interventional procedures to decrease the pain, allowing the patient to participate in the physical activity that will ultimately produce long-lasting pain reductions. The goal of the multidisciplinary approach is to return the patient to a higher level of overall function and to restore their ability to perform activities of daily living.  47 year old male with a history of lumbar spondylosis, lumbar facet arthropathy, new onset right shoulder pain, history of MI currently on dual antiplatelet therapy for PCI placed 08/23/2018 who follows up for medication management.  Patient endorses mild but positive benefit with belbuca.  He states that the pain is not as severe when he is resting but does not notice much benefit when he is active and moving.  He is not having any side effects.  Still having insomnia and trouble sleeping secondary to increased pain.  We discussed increasing his belbuca dose to 450 mcg twice daily.  Risks and benefits were discussed and  patient like to proceed.  Plan: -Increase belbuca as below -Continue tizanidine and Lyrica as prescribed.  Of note, no interventions until patient is off dual antiplatelet therapy, PCI 08/23/2018  Plan of Care  Pharmacotherapy (Medications Ordered): Meds ordered this encounter  Medications  . Buprenorphine HCl (BELBUCA) 450 MCG FILM    Sig: Place 450 mcg inside cheek 2 (two) times daily.    Dispense:  60 each    Refill:  0   Lab-work, procedure(s), and/or referral(s): Orders Placed This Encounter  Procedures  . ToxASSURE Select 13 (MW), Urine   Provider-requested follow-up: Return in about 4 weeks (  around 12/11/2018) for Medication Management.  Future Appointments  Date Time Provider [place]  12/06/2018  1:45 PM Gillis Santa, MD [facility] None    Primary Care Physician: Arnetha Courser, MD Location: [facility] Note by: Gillis Santa, M.D Date: 11/13/2018; Time: 9:55 AM  Patient Instructions  Belbuca to last 1 month has been escribed to your pharmacy.

## 2018-11-15 NOTE — Progress Notes (Signed)
Greetings. The liver function tests were all normal (hepatic function panel). The serum protein electrophoresis test is slightly abnormal, and I already ordered the follow-up study they recommend when I placed the original orders (in fact, you brought in the 24 hour urine container this afternoon). That may take several days. Your red blood cell count is just one-hundredth of a point above normal. Your eosinophils are a little elevated, and while I don't think that's clinically significant, we'll keep the whole picture in mind. The uric acid test is normal, and the homocysteine test is normal. A few other labs are pending. Peace, Dr. Sanda Klein

## 2018-11-17 LAB — TOXASSURE SELECT 13 (MW), URINE

## 2018-11-19 LAB — PROTEIN,TOTAL AND ELECT AND IFE,24
CREATININE 24H UR: 0.93 g/(24.h) (ref 0.50–2.15)
PROTEIN/CREATININE RATIO: 86 mg/g{creat} (ref <=114)
PROTEIN/CREATININE RATIO: 0.086 mg/mg creat (ref <=0.1)
Protein, 24H Urine: 80 mg/24 h (ref 0–149)

## 2018-11-20 LAB — CARDIO IQ ADV LIPID AND INFLAMM PNL
Apolipoprotein B: 79 mg/dL
CHOLESTEROL: 132 mg/dL (ref <200)
HDL: 38 mg/dL — AB
LDL Cholesterol (Calc): 78 mg/dL (calc) (ref <100)
LDL LARGE: 3848 nmol/L (ref 3382–9376)
LDL Medium: 181 nmol/L (ref 122–498)
LDL Particle Number: 825 nmol/L (ref 732–2035)
LDL Peak Size: 216.8 Angstrom — ABNORMAL LOW (ref >=217)
LDL Small: 147 nmol/L (ref 85–473)
LIPOPROTEIN (A) (CARDIO IQ ADV LIPID PANEL): 216 nmol/L — AB (ref <75)
NON-HDL CHOLESTEROL (CALC): 94 mg/dL (ref <130)
PLAC: 102 nmol/min/mL (ref 70–153)
TRIGLYCERIDES: 84 mg/dL (ref <150)
Total CHOL/HDL Ratio: 3.5 calc (ref <5.0)
hs-CRP: 6.8 mg/L — ABNORMAL HIGH

## 2018-11-20 LAB — CBC WITH DIFFERENTIAL/PLATELET
BASOS ABS: 68 {cells}/uL (ref 0–200)
Basophils Relative: 1.1 %
Eosinophils Absolute: 614 cells/uL — ABNORMAL HIGH (ref 15–500)
Eosinophils Relative: 9.9 %
HCT: 49 % (ref 38.5–50.0)
Hemoglobin: 15.9 g/dL (ref 13.2–17.1)
Lymphs Abs: 1649 cells/uL (ref 850–3900)
MCH: 27.4 pg (ref 27.0–33.0)
MCHC: 32.4 g/dL (ref 32.0–36.0)
MCV: 84.3 fL (ref 80.0–100.0)
MPV: 11.1 fL (ref 7.5–12.5)
Monocytes Relative: 7.3 %
NEUTROS PCT: 55.1 %
Neutro Abs: 3416 cells/uL (ref 1500–7800)
PLATELETS: 207 10*3/uL (ref 140–400)
RBC: 5.81 10*6/uL — ABNORMAL HIGH (ref 4.20–5.80)
RDW: 12.6 % (ref 11.0–15.0)
TOTAL LYMPHOCYTE: 26.6 %
WBC mixed population: 453 cells/uL (ref 200–950)
WBC: 6.2 10*3/uL (ref 3.8–10.8)

## 2018-11-20 LAB — HEPATIC FUNCTION PANEL
AG RATIO: 2.2 (calc) (ref 1.0–2.5)
ALKALINE PHOSPHATASE (APISO): 84 U/L (ref 40–115)
ALT: 32 U/L (ref 9–46)
AST: 26 U/L (ref 10–40)
Albumin: 4.8 g/dL (ref 3.6–5.1)
BILIRUBIN INDIRECT: 0.8 mg/dL (ref 0.2–1.2)
BILIRUBIN TOTAL: 1 mg/dL (ref 0.2–1.2)
Bilirubin, Direct: 0.2 mg/dL (ref 0.0–0.2)
Globulin: 2.2 g/dL (calc) (ref 1.9–3.7)
TOTAL PROTEIN: 7 g/dL (ref 6.1–8.1)

## 2018-11-20 LAB — URIC ACID: URIC ACID, SERUM: 4.7 mg/dL (ref 4.0–8.0)

## 2018-11-20 LAB — PROTEIN ELECTROPHORESIS, SERUM
Albumin ELP: 4.3 g/dL (ref 3.8–4.8)
Alpha 1: 0.3 g/dL (ref 0.2–0.3)
Alpha 2: 0.8 g/dL (ref 0.5–0.9)
Beta 2: 0.4 g/dL (ref 0.2–0.5)
Beta Globulin: 0.5 g/dL (ref 0.4–0.6)
Gamma Globulin: 0.7 g/dL — ABNORMAL LOW (ref 0.8–1.7)
TOTAL PROTEIN: 7.1 g/dL (ref 6.1–8.1)

## 2018-11-20 LAB — HOMOCYSTEINE: Homocysteine: 9.5 umol/L (ref <11.4)

## 2018-11-24 ENCOUNTER — Encounter: Payer: Self-pay | Admitting: Family Medicine

## 2018-11-24 DIAGNOSIS — I252 Old myocardial infarction: Secondary | ICD-10-CM | POA: Insufficient documentation

## 2018-11-24 DIAGNOSIS — M4310 Spondylolisthesis, site unspecified: Secondary | ICD-10-CM | POA: Insufficient documentation

## 2018-11-24 DIAGNOSIS — I517 Cardiomegaly: Secondary | ICD-10-CM | POA: Insufficient documentation

## 2018-11-24 HISTORY — DX: Cardiomegaly: I51.7

## 2018-11-24 NOTE — Assessment & Plan Note (Signed)
Continue plan per cardiologist; work to control lipids, A1c, weight, BP

## 2018-11-24 NOTE — Assessment & Plan Note (Signed)
LVEF 35-40% on last echo with moderate LVH; note to cardiologist to see about getting another echo now that he is a few months status post MI

## 2018-11-24 NOTE — Assessment & Plan Note (Signed)
Goal LDL less than 70 (less than 55 per EAS/ESC new guidelines); will get more in-depth lipid panel; continue statin; healthy eating, activity, weight management

## 2018-11-24 NOTE — Assessment & Plan Note (Signed)
Managed by pain clinic physician

## 2018-11-24 NOTE — Assessment & Plan Note (Signed)
Lifelong bouts

## 2018-11-24 NOTE — Assessment & Plan Note (Signed)
Last liver enzymes were normal; will recheck

## 2018-11-24 NOTE — Assessment & Plan Note (Signed)
Last A1c was 7.1, almost to goal; managed by endocrinologist; foot exam by MD today

## 2018-11-24 NOTE — Assessment & Plan Note (Signed)
Seeing pain clinic physician

## 2018-11-24 NOTE — Assessment & Plan Note (Signed)
Controlled today; continue regimen

## 2018-11-24 NOTE — Assessment & Plan Note (Signed)
Monitor LFTs 

## 2018-11-24 NOTE — Assessment & Plan Note (Signed)
Goal LDL less than 70; he has not had diabetes for so long that I would think that the DM is the sole causative factor of his heart disease; will do some additional testing, homocysteine, more in-depth lipid analysis; continue statin, meds per cardiologist

## 2018-12-02 LAB — HEMOCHROMATOSIS DNA-PCR(C282Y,H63D)

## 2018-12-02 LAB — FERRITIN: Ferritin: 67 ng/mL (ref 38–380)

## 2018-12-03 ENCOUNTER — Encounter: Payer: Self-pay | Admitting: Family Medicine

## 2018-12-03 ENCOUNTER — Other Ambulatory Visit: Payer: Self-pay | Admitting: Family Medicine

## 2018-12-03 MED ORDER — EZETIMIBE 10 MG PO TABS: 10.0000 mg | ORAL_TABLET | Freq: Every day | ORAL | 11 refills | Status: AC

## 2018-12-03 NOTE — Progress Notes (Signed)
Add zetia per cardiology

## 2018-12-06 ENCOUNTER — Ambulatory Visit
Payer: 59 | Attending: Student in an Organized Health Care Education/Training Program | Admitting: Student in an Organized Health Care Education/Training Program

## 2018-12-06 ENCOUNTER — Encounter: Payer: Self-pay | Admitting: Student in an Organized Health Care Education/Training Program

## 2018-12-06 ENCOUNTER — Other Ambulatory Visit: Payer: Self-pay

## 2018-12-06 VITALS — BP 150/87 | HR 79 | Temp 97.9°F | Resp 18 | Ht 71.0 in | Wt 212.0 lb

## 2018-12-06 DIAGNOSIS — Z794 Long term (current) use of insulin: Secondary | ICD-10-CM | POA: Insufficient documentation

## 2018-12-06 DIAGNOSIS — E785 Hyperlipidemia, unspecified: Secondary | ICD-10-CM | POA: Diagnosis not present

## 2018-12-06 DIAGNOSIS — K589 Irritable bowel syndrome without diarrhea: Secondary | ICD-10-CM | POA: Diagnosis not present

## 2018-12-06 DIAGNOSIS — Z833 Family history of diabetes mellitus: Secondary | ICD-10-CM | POA: Insufficient documentation

## 2018-12-06 DIAGNOSIS — I10 Essential (primary) hypertension: Secondary | ICD-10-CM | POA: Diagnosis not present

## 2018-12-06 DIAGNOSIS — E1165 Type 2 diabetes mellitus with hyperglycemia: Secondary | ICD-10-CM | POA: Insufficient documentation

## 2018-12-06 DIAGNOSIS — R748 Abnormal levels of other serum enzymes: Secondary | ICD-10-CM | POA: Diagnosis not present

## 2018-12-06 DIAGNOSIS — I252 Old myocardial infarction: Secondary | ICD-10-CM | POA: Insufficient documentation

## 2018-12-06 DIAGNOSIS — Z8249 Family history of ischemic heart disease and other diseases of the circulatory system: Secondary | ICD-10-CM | POA: Diagnosis not present

## 2018-12-06 DIAGNOSIS — M5416 Radiculopathy, lumbar region: Secondary | ICD-10-CM | POA: Diagnosis not present

## 2018-12-06 DIAGNOSIS — M4726 Other spondylosis with radiculopathy, lumbar region: Secondary | ICD-10-CM | POA: Diagnosis not present

## 2018-12-06 DIAGNOSIS — Z9889 Other specified postprocedural states: Secondary | ICD-10-CM | POA: Insufficient documentation

## 2018-12-06 DIAGNOSIS — M109 Gout, unspecified: Secondary | ICD-10-CM | POA: Insufficient documentation

## 2018-12-06 DIAGNOSIS — M5116 Intervertebral disc disorders with radiculopathy, lumbar region: Secondary | ICD-10-CM | POA: Insufficient documentation

## 2018-12-06 DIAGNOSIS — R740 Nonspecific elevation of levels of transaminase and lactic acid dehydrogenase [LDH]: Secondary | ICD-10-CM | POA: Diagnosis present

## 2018-12-06 DIAGNOSIS — Z888 Allergy status to other drugs, medicaments and biological substances status: Secondary | ICD-10-CM | POA: Insufficient documentation

## 2018-12-06 DIAGNOSIS — Z79899 Other long term (current) drug therapy: Secondary | ICD-10-CM | POA: Insufficient documentation

## 2018-12-06 DIAGNOSIS — G894 Chronic pain syndrome: Secondary | ICD-10-CM | POA: Diagnosis not present

## 2018-12-06 DIAGNOSIS — Z87891 Personal history of nicotine dependence: Secondary | ICD-10-CM | POA: Insufficient documentation

## 2018-12-06 DIAGNOSIS — M47816 Spondylosis without myelopathy or radiculopathy, lumbar region: Secondary | ICD-10-CM

## 2018-12-06 DIAGNOSIS — M48062 Spinal stenosis, lumbar region with neurogenic claudication: Secondary | ICD-10-CM | POA: Insufficient documentation

## 2018-12-06 DIAGNOSIS — Z7982 Long term (current) use of aspirin: Secondary | ICD-10-CM | POA: Insufficient documentation

## 2018-12-06 DIAGNOSIS — Z5181 Encounter for therapeutic drug level monitoring: Secondary | ICD-10-CM | POA: Insufficient documentation

## 2018-12-06 MED ORDER — BUPRENORPHINE HCL 450 MCG BU FILM: 450.0000 ug | ORAL_FILM | Freq: Two times a day (BID) | BUCCAL | 2 refills | Status: AC

## 2018-12-06 NOTE — Progress Notes (Signed)
Patient's Name: John Hartman  MRN: [id number]  Referring Provider: Arnetha Courser, MD  DOB: Jun 07, 1971  PCP: Arnetha Courser, MD  DOS: 12/06/2018  Note by: Gillis Santa, MD  Service setting: Ambulatory outpatient  Specialty: Interventional Pain Management  Location: [facility] (AMB) Pain Management Facility    Patient type: Established   Primary Reason(s) for Visit: Encounter for prescription drug management. (Level of risk: moderate)  CC: Back Pain (low) and Shoulder Pain (right)  HPI  John Hartman is a 47 y.o. year old, male patient, who comes today for a medication management evaluation. He has Essential hypertension; Gout; IBS (irritable bowel syndrome); Family history of malignant neoplasm of gastrointestinal tract; Benign neoplasm of rectosigmoid junction; Palpitations; Decongestant abuse; Ketonuria; Glucosuria; Preventative health care; Chest pain; Uncontrolled diabetes mellitus with hyperglycemia ([place]); Encounter for medication monitoring; Coronary artery disease; Angioedema; Hyperlipidemia LDL goal <70; Elevated serum glutamic pyruvic transaminase (SGPT) level; Elevated liver enzymes; Serum total bilirubin elevated; Fatty liver; Chronic lower back pain; Degenerative disc disease, lumbar; Arthritis, lumbar spine; Calcification of abdominal aorta ([place]); Elevated hemoglobin (HCC); NSTEMI (non-ST elevated myocardial infarction) ([place]); Status post myocardial infarction; LVH (left ventricular hypertrophy); and Retrolisthesis of vertebrae on their problem list. His primarily concern today is the Back Pain (low) and Shoulder Pain (right)  Pain Assessment: Location: Lower Back Radiating: to hips bilaterally (right hip is worse), "when i do too much" Onset: More than a month ago Duration: Chronic pain Quality: Aching, Constant, Sharp Severity: 5 /10 (subjective, self-reported pain score)  Note: Reported level is compatible with observation.                         When using our objective Pain Scale, levels  between 6 and 10/10 are said to belong in an emergency room, as it progressively worsens from a 6/10, described as severely limiting, requiring emergency care not usually available at an outpatient pain management facility. At a 6/10 level, communication becomes difficult and requires great effort. Assistance to reach the emergency department may be required. Facial flushing and profuse sweating along with potentially dangerous increases in heart rate and blood pressure will be evident. Effect on ADL: limits dailt activity Timing: Constant Modifying factors: meds, heating pad, ice packs, heating pad BP: (!) 150/87  HR: 79  John Hartman was last scheduled for an appointment on 11/13/2018 for medication management. During today's appointment we reviewed John Hartman chronic pain status, as well as his outpatient medication regimen.  The patient  reports no history of drug use. His body mass index is 29.57 kg/m.  Further details on both, my assessment(s), as well as the proposed treatment plan, please see below.  Controlled Substance Pharmacotherapy Assessment REMS (Risk Evaluation and Mitigation Strategy)  Analgesic: Belbuca 450 mcg twice daily Rise Patience, RN  12/06/2018  1:54 PM  Signed Nursing Pain Medication Assessment:  Safety precautions to be maintained throughout the outpatient stay will include: orient to surroundings, keep bed in low position, maintain call bell within reach at all times, provide assistance with transfer out of bed and ambulation.  Medication Inspection Compliance: Pill count conducted under aseptic conditions, in front of the patient. Neither the pills nor the bottle was removed from the patient's sight at any time. Once count was completed pills were immediately returned to the patient in their original bottle.  Medication: Buprenorphine (Suboxone) Pill/Patch Count: 19 of 60 patches remain Pill/Patch Appearance: Markings consistent with prescribed medication Bottle  Appearance: Standard pharmacy container. Clearly  labeled. Filled Date: 16 / 19 / 2019 Last Medication intake:  Today   Pharmacokinetics: Liberation and absorption (onset of action): WNL Distribution (time to peak effect): WNL Metabolism and excretion (duration of action): WNL         Pharmacodynamics: Desired effects: Analgesia: John Hartman reports <50% benefit. Functional ability: Patient reports that medication does help, but not nearly as much as he would like Clinically meaningful improvement in function (CMIF): Sustained CMIF goals met Perceived effectiveness: Described as relatively effective but with some room for improvement Undesirable effects: Side-effects or Adverse reactions: None reported Monitoring: [place] PMP: Online review of the past 40-monthperiod conducted. Compliant with practice rules and regulations Last UDS on record: Summary  Date Value Ref Range Status  11/13/2018 FINAL  Final    Comment:    ==================================================================== TOXASSURE SELECT 13 (MW) ==================================================================== Test                             Result       Flag       Units   NO DRUGS DETECTED. ==================================================================== Test                      Result    Flag   Units      Ref Range   Creatinine              91               mg/dL      >=20 ==================================================================== Declared Medications:  Medication list was not provided. ==================================================================== For clinical consultation, please call ([phone] ====================================================================    UDS interpretation: Compliant          Medication Assessment Form: Reviewed. Patient indicates being compliant with therapy Treatment compliance: Compliant Risk Assessment Profile: Aberrant behavior: See prior  evaluations. None observed or detected today Comorbid factors increasing risk of overdose: See prior notes. No additional risks detected today Opioid risk tool (ORT) (Total Score): 0 Personal History of Substance Abuse (SUD-Substance use disorder):  Alcohol: Negative  Illegal Drugs: Negative  Rx Drugs: Negative  ORT Risk Level calculation: Low Risk Risk of substance use disorder (SUD): Low Opioid Risk Tool - 12/06/18 1405      Family History of Substance Abuse   Alcohol  Negative    Illegal Drugs  Negative    Rx Drugs  Negative      Personal History of Substance Abuse   Alcohol  Negative    Illegal Drugs  Negative    Rx Drugs  Negative      Age   Age between 156-45years   No      History of Preadolescent Sexual Abuse   History of Preadolescent Sexual Abuse  Negative or Male      Psychological Disease   Psychological Disease  Negative    Depression  Negative      Total Score   Opioid Risk Tool Scoring  0    Opioid Risk Interpretation  Low Risk      ORT Scoring interpretation table:  Score <3 = Low Risk for SUD  Score between 4-7 = Moderate Risk for SUD  Score >8 = High Risk for Opioid Abuse   Risk Mitigation Strategies:  Patient Counseling: Covered Patient-Prescriber Agreement (PPA): Present and active  Notification to other healthcare providers: Done  Pharmacologic Plan: No change in therapy, at this time.  Laboratory Chemistry  Inflammation Markers (CRP: Acute Phase) (ESR: Chronic Phase) No results found for: CRP, ESRSEDRATE, LATICACIDVEN                       Rheumatology Markers Lab Results  Component Value Date   RF <14 08/04/2017   ANA Negative 09/21/2017   LABURIC 4.7 11/13/2018                        Renal Function Markers Lab Results  Component Value Date   BUN 17 10/17/2018   CREATININE 1.04 56/43/3295   BCR NOT APPLICABLE 18/84/1660   GFRAA >60 10/17/2018   GFRNONAA >60 10/17/2018                             Hepatic Function  Markers Lab Results  Component Value Date   AST 26 11/13/2018   ALT 32 11/13/2018   ALBUMIN 4.8 10/17/2018   ALKPHOS 89 10/17/2018                        Electrolytes Lab Results  Component Value Date   NA 142 10/17/2018   K 3.9 10/17/2018   CL 103 10/17/2018   CALCIUM 9.4 10/17/2018   MG 2.0 08/27/2016   PHOS 1.9 (L) 08/27/2016                        Neuropathy Markers Lab Results  Component Value Date   HGBA1C 7.1 (A) 08/10/2018   HIV Non Reactive 08/23/2018                        CNS Tests No results found for: COLORCSF, APPEARCSF, RBCCOUNTCSF, WBCCSF, POLYSCSF, LYMPHSCSF, EOSCSF, PROTEINCSF, GLUCCSF, JCVIRUS, CSFOLI, IGGCSF                      Bone Pathology Markers Lab Results  Component Value Date   VD25OH 30 08/04/2017                         Coagulation Parameters Lab Results  Component Value Date   INR 1.11 08/22/2018   LABPROT 14.2 08/22/2018   APTT 116 (H) 08/22/2018   PLT 207 11/13/2018                        Cardiovascular Markers Lab Results  Component Value Date   TROPONINI 16.49 (Stamps) 08/23/2018   HGB 15.9 11/13/2018   HCT 49.0 11/13/2018                         CA Markers No results found for: CEA, CA125, LABCA2                      Note: Lab results reviewed.  Recent Diagnostic Imaging Results  ECHOCARDIOGRAM COMPLETE                 [facility]                       [address], [place] [address]  [phone]  ------------------------------------------------------------------- Transthoracic Echocardiography  Patient:    Talik, Casique MR #:       [id number] Study Date: 08/24/2018 Gender:     M Age:        70 Height:     180.3 cm Weight:     104.3 kg BSA:        2.32 m^2 Pt. Status: Room:   PERFORMING   [facility]  SONOGRAPHER  Charmayne Sheer, RDCS  ATTENDING    Hortencia Conradi  REFERRING     Doristine Mango L  cc:  ------------------------------------------------------------------- LV EF: 35% -   40%  ------------------------------------------------------------------- Indications:      CAD Native Vessel.  ------------------------------------------------------------------- History:   Risk factors:  Hypertension. Diabetes mellitus.  ------------------------------------------------------------------- Study Conclusions  - Left ventricle: Wall thickness was increased in a pattern of   moderate LVH. Systolic function was moderately reduced. The   estimated ejection fraction was in the range of 35% to 40%.   Hypokinesis of the inferior myocardium. Hypokinesis of the   inferolateral myocardium. - Aortic valve: Valve area (VTI): 3.35 cm^2. Valve area (Vmax):   3.05 cm^2. Valve area (Vmean): 3.29 cm^2. - Mitral valve: Valve area by continuity equation (using LVOT   flow): 3.53 cm^2.  ------------------------------------------------------------------- Study data:   Study status:  Routine.  Procedure:  The patient reported no pain pre or post test. Transthoracic echocardiography. Image quality was suboptimal. The study was technically difficult, as a result of poor sound wave transmission and body habitus.    Transthoracic echocardiography.  M-mode, complete 2D, spectral Doppler, and color Doppler.  Birthdate:  Patient birthdate: [birth year].  Age:  Patient is 47 yr old.  Sex:  Gender: male. BMI: 32.1 kg/m^2.  Blood pressure:     146/109  Patient status: Inpatient.  Study date:  Study date: 08/24/2018. Study time: 02:43 PM.  Location:  Bedside.  -------------------------------------------------------------------  ------------------------------------------------------------------- Left ventricle:   Wall thickness was increased in a pattern of moderate LVH.   Systolic function was moderately reduced. The estimated ejection fraction was in the range of 35% to 40%. Regional  wall motion abnormalities:   Hypokinesis of the inferior myocardium.  Hypokinesis of the inferolateral myocardium.  ------------------------------------------------------------------- Aortic valve:   Trileaflet.  Doppler:   There was no stenosis. VTI ratio of LVOT to aortic valve: 0.88. Valve area (VTI): 3.35 cm^2. Indexed valve area (VTI): 1.45 cm^2/m^2. Peak velocity ratio of LVOT to aortic valve: 0.8. Valve area (Vmax): 3.05 cm^2. Indexed valve area (Vmax): 1.32 cm^2/m^2. Mean velocity ratio of LVOT to aortic valve: 0.87. Valve area (Vmean): 3.29 cm^2. Indexed valve area (Vmean): 1.42 cm^2/m^2.    Mean gradient (S): 2 mm Hg. Peak gradient (S): 4 mm Hg.  ------------------------------------------------------------------- Mitral valve:  Poorly visualized.  Doppler:  There was trivial regurgitation.    Valve area by pressure half-time: 4.4 cm^2. Indexed valve area by pressure half-time: 1.9 cm^2/m^2. Valve area by continuity equation (using LVOT flow): 3.53 cm^2. Indexed valve area by continuity equation (using LVOT flow): 1.52 cm^2/m^2. Mean gradient (D): 1 mm Hg.  ------------------------------------------------------------------- Left atrium:  The atrium was normal in size.  ------------------------------------------------------------------- Right ventricle:  The cavity size was normal. Wall thickness was normal. Systolic function was normal.  ------------------------------------------------------------------- Tricuspid valve:  Poorly visualized.  Doppler:  There was trivial regurgitation.  ------------------------------------------------------------------- Pericardium:  Not well visualized.  ------------------------------------------------------------------- Measurements   Left ventricle  Value          Reference  LV ID, ED, PLAX chordal           (L)     36    mm       43 - 52  LV ID, ES, PLAX chordal                   30.6  mm       23 - 38  LV  fx shortening, PLAX chordal    (L)     15    %        >=29  LV PW thickness, ED                       13.5  mm       ---------  IVS/LV PW ratio, ED                       0.92           <=1.3  Stroke volume, 2D                         45    ml       ---------  Stroke volume/bsa, 2D                     19    ml/m^2   ---------  LV ejection fraction, 1-p A4C             50    %        ---------  LV end-diastolic volume, 2-p              67    ml       ---------  LV end-systolic volume, 2-p               40    ml       ---------  LV ejection fraction, 2-p                 41    %        ---------  Stroke volume, 2-p                        27    ml       ---------  LV end-diastolic volume/bsa, 2-p          29    ml/m^2   ---------  LV end-systolic volume/bsa, 2-p           17    ml/m^2   ---------  Stroke volume/bsa, 2-p                    11.8  ml/m^2   ---------  LV e&', lateral                            3.81  cm/s     ---------  LV E/e&', lateral                          10.58          ---------  LV e&', medial  4.03  cm/s     ---------  LV E/e&', medial                           10             ---------  LV e&', average                            3.92  cm/s     ---------  LV E/e&', average                          10.28          ---------    Ventricular septum                        Value          Reference  IVS thickness, ED                         12.4  mm       ---------    LVOT                                      Value          Reference  LVOT ID, S                                22    mm       ---------  LVOT area                                 3.8   cm^2     ---------  LVOT peak velocity, S                     79.1  cm/s     ---------  LVOT mean velocity, S                     57    cm/s     ---------  LVOT VTI, S                               11.8  cm       ---------    Aortic valve                              Value          Reference  Aortic valve  peak velocity, S             98.6  cm/s     ---------  Aortic valve mean velocity, S             65.8  cm/s     ---------  Aortic valve VTI, S                       13.4  cm       ---------  Aortic  mean gradient, S                   2     mm Hg    ---------  Aortic peak gradient, S                   4     mm Hg    ---------  VTI ratio, LVOT/AV                        0.88           ---------  Aortic valve area, VTI                    3.35  cm^2     ---------  Aortic valve area/bsa, VTI                1.45  cm^2/m^2 ---------  Velocity ratio, peak, LVOT/AV             0.8            ---------  Aortic valve area, peak velocity          3.05  cm^2     ---------  Aortic valve area/bsa, peak               1.32  cm^2/m^2 ---------  velocity  Velocity ratio, mean, LVOT/AV             0.87           ---------  Aortic valve area, mean velocity          3.29  cm^2     ---------  Aortic valve area/bsa, mean               1.42  cm^2/m^2 ---------  velocity    Aorta                                     Value          Reference  Aortic root ID, ED                        36    mm       ---------    Left atrium                               Value          Reference  LA ID, A-P, ES                            37    mm       ---------  LA ID/bsa, A-P                            1.6   cm/m^2   <=2.2  LA volume, S                              35.3  ml       ---------  LA volume/bsa, S  15.2  ml/m^2   ---------  LA volume, ES, 1-p A4C                    30.9  ml       ---------  LA volume/bsa, ES, 1-p A4C                13.3  ml/m^2   ---------  LA volume, ES, 1-p A2C                    35.5  ml       ---------  LA volume/bsa, ES, 1-p A2C                15.3  ml/m^2   ---------    Mitral valve                              Value          Reference  Mitral E-wave peak velocity               40.3  cm/s     ---------  Mitral A-wave peak velocity               82.9  cm/s     ---------   Mitral mean velocity, D                   44.3  cm/s     ---------  Mitral deceleration time                  169   ms       150 - 230  Mitral pressure half-time                 50    ms       ---------  Mitral mean gradient, D                   1     mm Hg    ---------  Mitral E/A ratio, peak                    0.5            ---------  Mitral valve area, PHT, DP                4.4   cm^2     ---------  Mitral valve area/bsa, PHT, DP            1.9   cm^2/m^2 ---------  Mitral valve area, LVOT                   3.53  cm^2     ---------  continuity  Mitral valve area/bsa, LVOT               1.52  cm^2/m^2 ---------  continuity  Mitral annulus VTI, D                     12.7  cm       ---------    Right ventricle                           Value          Reference  RV ID, minor axis, ED, A4C base  24    mm       ---------    Pulmonic valve                            Value          Reference  Pulmonic valve peak velocity, S           88.8  cm/s     ---------  Pulmonic acceleration time                55    ms       ---------  Legend: (L)  and  (H)  mark values outside specified reference range.  ------------------------------------------------------------------- Prepared and Electronically Authenticated by  Bartholome Bill, MD 2019-08-30T15:39:06  Complexity Note: Imaging results reviewed. Results shared with Mr. Aubuchon, using Layman's terms.                         Meds   Current Outpatient Medications:  .  ACCU-CHEK SOFTCLIX LANCETS lancets, USE AS DIRECTED, Disp: 100 each, Rfl: 0 .  allopurinol (ZYLOPRIM) 100 MG tablet, Take 100 mg by mouth every morning. , Disp: , Rfl:  .  aspirin EC 81 MG tablet, Take 81 mg by mouth daily., Disp: , Rfl:  .  atorvastatin (LIPITOR) 80 MG tablet, Take 1 tablet (80 mg total) by mouth daily at 6 PM., Disp: 30 tablet, Rfl: 0 .  [place] alk-PHENObarbital (DONNATAL) 16.2 MG tablet, Take 1 tablet by mouth as needed (pain)., Disp: , Rfl:  .   Buprenorphine HCl (BELBUCA) 450 MCG FILM, Place 450 mcg inside cheek 2 (two) times daily., Disp: 60 each, Rfl: 2 .  cetirizine (ZYRTEC) 10 MG tablet, Take 10 mg by mouth at bedtime. , Disp: , Rfl:  .  colchicine 0.6 MG tablet, Take 0.6 mg by mouth daily as needed (gout). , Disp: , Rfl:  .  Dapagliflozin-metFORMIN HCl ER (XIGDUO XR) 04-999 MG TB24, Take 10-200 mg by mouth daily., Disp: , Rfl:  .  diphenhydrAMINE (BENADRYL) 25 mg capsule, Take 50 mg by mouth at bedtime. , Disp: , Rfl:  .  EPINEPHrine (EPIPEN 2-PAK) 0.3 mg/0.3 mL IJ SOAJ injection, Inject 0.3 mg into the muscle once., Disp: , Rfl:  .  ezetimibe (ZETIA) 10 MG tablet, Take 1 tablet (10 mg total) by mouth daily., Disp: 30 tablet, Rfl: 11 .  glucose blood (ACCU-CHEK ACTIVE STRIPS) test strip, Use as instructed, Disp: 100 each, Rfl: 12 .  glucose blood (ACCU-CHEK AVIVA PLUS) test strip, Check FSBS three to four times per day, Disp: 100 each, Rfl: 1 .  hydrALAZINE (APRESOLINE) 25 MG tablet, Take 1 tablet (25 mg total) by mouth 2 (two) times daily., Disp: 60 tablet, Rfl: 0 .  hydrochlorothiazide (HYDRODIURIL) 12.5 MG tablet, Take 12.5 mg by mouth daily., Disp: , Rfl:  .  indomethacin (INDOCIN) 25 MG capsule, Take 25 mg by mouth 2 (two) times daily as needed for mild pain., Disp: , Rfl:  .  Insulin Degludec (TRESIBA FLEXTOUCH) 100 UNIT/ML SOPN, Inject 34 Units into the skin at bedtime. , Disp: 1 pen, Rfl: 0 .  Insulin Pen Needle (B-D UF III MINI PEN NEEDLES) 31G X 5 MM MISC, Once a day with insulin, Disp: 50 each, Rfl: 3 .  Melatonin 5 MG TABS, Take 5 mg by mouth at bedtime. , Disp: , Rfl:  .  metoprolol tartrate (LOPRESSOR) 50 MG tablet, Take  1 tablet (50 mg total) by mouth 2 (two) times daily. (Patient taking differently: Take 25 mg by mouth 2 (two) times daily. ), Disp: 60 tablet, Rfl: 0 .  nitroGLYCERIN (NITROSTAT) 0.4 MG SL tablet, Place 1 tablet (0.4 mg total) under the tongue every 5 (five) minutes as needed for chest pain., Disp: 30  tablet, Rfl: 0 .  pregabalin (LYRICA) 50 MG capsule, Take 1 capsule (50 mg total) by mouth 3 (three) times daily., Disp: 90 capsule, Rfl: 2 .  ticagrelor (BRILINTA) 90 MG TABS tablet, Take 90 mg by mouth 2 (two) times daily., Disp: , Rfl:  .  tiZANidine (ZANAFLEX) 4 MG tablet, Take 1 tablet (4 mg total) by mouth every 8 (eight) hours as needed for muscle spasms., Disp: 90 tablet, Rfl: 5 .  oxyCODONE-acetaminophen (PERCOCET) 7.5-325 MG tablet, Take 1 tablet by mouth 3 (three) times daily as needed for severe pain. (Patient not taking: Reported on 11/13/2018), Disp: 90 tablet, Rfl: 0 .  ranitidine (ZANTAC) 150 MG capsule, Take 150 mg by mouth at bedtime. , Disp: , Rfl:   ROS  Constitutional: Denies any fever or chills Gastrointestinal: No reported hemesis, hematochezia, vomiting, or acute GI distress Musculoskeletal: Denies any acute onset joint swelling, redness, loss of ROM, or weakness Neurological: No reported episodes of acute onset apraxia, aphasia, dysarthria, agnosia, amnesia, paralysis, loss of coordination, or loss of consciousness  Allergies  Mr. Soloway is allergic to lisinopril and ace inhibitors.  PFSH  Drug: Mr. Tunney  reports no history of drug use. Alcohol:  reports no history of alcohol use. Tobacco:  reports that he quit smoking about 3 years ago. His smoking use included cigarettes. He has a 10.00 pack-year smoking history. He has never used smokeless tobacco. Medical:  has a past medical history of Angioedema, Bronchitis, DDD (degenerative disc disease), cervical, Diabetes mellitus ([place]), Fatty liver (03/31/2017), GERD (gastroesophageal reflux disease), Gout, Heart attack ([place]), HTN (hypertension), Hypertension, IBS (irritable bowel syndrome), LVH (left ventricular hypertrophy) (11/24/2018), Myocardial infarction ([place]) (08/23/2018), Seasonal allergies, and Spinal stenosis. Surgical: Mr. Waterhouse  has a past surgical history that includes Shoulder surgery (Left, 2007); Nasal septum  surgery (2004); ACDF with fusion (2007); Testicular torsion (1980s); Vasectomy; Colonoscopy with propofol (N/A, 06/26/2015); Cardiac catheterization (Left, 08/15/2016); Coronary/Graft Acute MI Revascularization (N/A, 08/23/2018); and LEFT HEART CATH AND CORONARY ANGIOGRAPHY (N/A, 08/23/2018). Family: family history includes Allergies in his son; Alzheimer's disease in his maternal grandmother; Arrhythmia in his mother; Breast cancer in his mother; Cancer in his maternal grandfather and mother; Diabetes in his father; Heart disease in his brother; Hypertension in his father; Skin cancer in his mother.  Constitutional Exam  General appearance: Well nourished, well developed, and well hydrated. In no apparent acute distress Vitals:   12/06/18 1356  BP: (!) 150/87  Pulse: 79  Resp: 18  Temp: 97.9 F (36.6 C)  TempSrc: Oral  SpO2: 100%  Weight: 212 lb (96.2 kg)  Height: _0  (1.803 m)   BMI Assessment: Estimated body mass index is 29.57 kg/m as calculated from the following:   Height as of this encounter: _1  (1.803 m).   Weight as of this encounter: 212 lb (96.2 kg).  BMI interpretation table: BMI level Category Range association with higher incidence of chronic pain  <18 kg/m2 Underweight   18.5-24.9 kg/m2 Ideal body weight   25-29.9 kg/m2 Overweight Increased incidence by 20%  30-34.9 kg/m2 Obese (Class I) Increased incidence by 68%  35-39.9 kg/m2 Severe obesity (Class II) Increased incidence by  136%  >40 kg/m2 Extreme obesity (Class III) Increased incidence by 254%   Patient's current BMI Ideal Body weight  Body mass index is 29.57 kg/m. Ideal body weight: 75.3 kg (166 lb 0.1 oz) Adjusted ideal body weight: 83.6 kg (184 lb 6.5 oz)   BMI Readings from Last 4 Encounters:  12/06/18 29.57 kg/m  11/13/18 29.57 kg/m  10/30/18 29.87 kg/m  10/17/18 30.40 kg/m   Wt Readings from Last 4 Encounters:  12/06/18 212 lb (96.2 kg)  11/13/18 212 lb (96.2 kg)  10/30/18 214 lb 3.2 oz  (97.2 kg)  10/17/18 218 lb (98.9 kg)  Psych/Mental status: Alert, oriented x 3 (person, place, & time)       Eyes: PERLA Respiratory: No evidence of acute respiratory distress  Cervical Spine Area Exam  Skin & Axial Inspection: No masses, redness, edema, swelling, or associated skin lesions Alignment: Symmetrical Functional ROM: Unrestricted ROM      Stability: No instability detected Muscle Tone/Strength: Functionally intact. No obvious neuro-muscular anomalies detected. Sensory (Neurological): Unimpaired Palpation: No palpable anomalies              Upper Extremity (UE) Exam    Side: Right upper extremity  Side: Left upper extremity  Skin & Extremity Inspection: Skin color, temperature, and hair growth are WNL. No peripheral edema or cyanosis. No masses, redness, swelling, asymmetry, or associated skin lesions. No contractures.  Skin & Extremity Inspection: Skin color, temperature, and hair growth are WNL. No peripheral edema or cyanosis. No masses, redness, swelling, asymmetry, or associated skin lesions. No contractures.  Functional ROM: Unrestricted ROM          Functional ROM: Unrestricted ROM          Muscle Tone/Strength: Functionally intact. No obvious neuro-muscular anomalies detected.  Muscle Tone/Strength: Functionally intact. No obvious neuro-muscular anomalies detected.  Sensory (Neurological): Unimpaired          Sensory (Neurological): Unimpaired          Palpation: No palpable anomalies              Palpation: No palpable anomalies              Provocative Test(s):  Phalen's test: deferred Tinel's test: deferred Apley's scratch test (touch opposite shoulder):  Action 1 (Across chest): deferred Action 2 (Overhead): deferred Action 3 (LB reach): deferred   Provocative Test(s):  Phalen's test: deferred Tinel's test: deferred Apley's scratch test (touch opposite shoulder):  Action 1 (Across chest): deferred Action 2 (Overhead): deferred Action 3 (LB reach): deferred     Thoracic Spine Area Exam  Skin & Axial Inspection: No masses, redness, or swelling Alignment: Symmetrical Functional ROM: Unrestricted ROM Stability: No instability detected Muscle Tone/Strength: Functionally intact. No obvious neuro-muscular anomalies detected. Sensory (Neurological): Unimpaired Muscle strength & Tone: No palpable anomalies  Lumbar Spine Area Exam  Skin & Axial Inspection: No masses, redness, or swelling Alignment: Symmetrical Functional ROM: Decreased ROM       Stability: No instability detected Muscle Tone/Strength: Functionally intact. No obvious neuro-muscular anomalies detected. Sensory (Neurological): Musculoskeletal pain pattern and dermatomal Palpation: Complains of area being tender to palpation       Provocative Tests: Hyperextension/rotation test: (+) due to pain. Lumbar quadrant test (Kemp's test): (+) due to pain. Lateral bending test: deferred today       Patrick's Maneuver: deferred today                   FABER* test: deferred today  S-I anterior distraction/compression test: deferred today         S-I lateral compression test: deferred today         S-I Thigh-thrust test: deferred today         S-I Gaenslen's test: deferred today         *(Flexion, ABduction and External Rotation)  Gait & Posture Assessment  Ambulation: Unassisted Gait: Relatively normal for age and body habitus Posture: WNL   Lower Extremity Exam    Side: Right lower extremity  Side: Left lower extremity  Stability: No instability observed          Stability: No instability observed          Skin & Extremity Inspection: Skin color, temperature, and hair growth are WNL. No peripheral edema or cyanosis. No masses, redness, swelling, asymmetry, or associated skin lesions. No contractures.  Skin & Extremity Inspection: Skin color, temperature, and hair growth are WNL. No peripheral edema or cyanosis. No masses, redness, swelling, asymmetry, or associated skin  lesions. No contractures.  Functional ROM: Unrestricted ROM                  Functional ROM: Unrestricted ROM                  Muscle Tone/Strength: Functionally intact. No obvious neuro-muscular anomalies detected.  Muscle Tone/Strength: Functionally intact. No obvious neuro-muscular anomalies detected.  Sensory (Neurological): Unimpaired        Sensory (Neurological): Unimpaired        DTR: Patellar: deferred today Achilles: deferred today Plantar: deferred today  DTR: Patellar: deferred today Achilles: deferred today Plantar: deferred today  Palpation: No palpable anomalies  Palpation: No palpable anomalies   Assessment  Primary Diagnosis & Pertinent Problem List: The primary encounter diagnosis was Chronic pain syndrome. Diagnoses of Lumbar facet arthropathy, Spondylosis of lumbar region without myelopathy or radiculopathy, Lumbar radiculopathy, and Spinal stenosis of lumbar region with neurogenic claudication were also pertinent to this visit.  Status Diagnosis  Persistent Persistent Persistent 1. Chronic pain syndrome   2. Lumbar facet arthropathy   3. Spondylosis of lumbar region without myelopathy or radiculopathy   4. Lumbar radiculopathy   5. Spinal stenosis of lumbar region with neurogenic claudication     General Recommendations: The pain condition that the patient suffers from is best treated with a multidisciplinary approach that involves an increase in physical activity to prevent de-conditioning and worsening of the pain cycle, as well as psychological counseling (formal and/or informal) to address the co-morbid psychological affects of pain. Treatment will often involve judicious use of pain medications and interventional procedures to decrease the pain, allowing the patient to participate in the physical activity that will ultimately produce long-lasting pain reductions. The goal of the multidisciplinary approach is to return the patient to a higher level of overall  function and to restore their ability to perform activities of daily living.  Patient follows up today continuing to endorse axial low back pain.  He states that the pain is worse with certain movements and it comes up on spontaneously.  He states that he is fairly limited in his activities of daily living.  He is planning on getting a new mattress.  He is unsure how helpful the belbuca is.  He does not want to try anything stronger which I agree with.  We have tried various medication trials including NSAIDs including ibuprofen, naproxen, diclofenac along with various memory stabilizers including gabapentin, Lyrica without any significant benefit.  Of note the patient states that he has had a myriad of blood test done with his primary care physician which were all pretty much negative.  He states that he can try a vegetarian diet starting next week.  Patient also inquired about CBD which we discussed.  I informed the patient that I do not have any concrete evidence that CBD is effective for her axial low back pain that he is describing.  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  Can consider repeat radiofrequency ablation, lumbar epidural steroid injection at that time.  We also discussed spinal cord stimulation.  Will discuss further once patient is off dual antiplatelet therapy.  Plan of Care  Pharmacotherapy (Medications Ordered): Meds ordered this encounter  Medications  . Buprenorphine HCl (BELBUCA) 450 MCG FILM    Sig: Place 450 mcg inside cheek 2 (two) times daily.    Dispense:  60 each    Refill:  2    Provider-requested follow-up: Return in about 3 months (around 03/07/2019) for Medication Management.  Time Note: Greater than 50% of the 25 minute(s) of face-to-face time spent with Mr. Neenan, was spent in counseling/coordination of care regarding: Mr. Franklin primary cause of pain, the treatment plan, treatment alternatives,  medication side effects, the opioid analgesic risks and possible complications, the appropriate use of his medications, realistic expectations, the goals of pain management (increased in functionality), the medication agreement and the patient's responsibilities when it comes to controlled substances.  Future Appointments  Date Time Provider [place]  03/07/2019 11:45 AM Gillis Santa, MD [facility] None    Primary Care Physician: Arnetha Courser, MD Location: [facility] Note by: Gillis Santa, M.D Date: 12/06/2018; Time: 3:17 PM  There are no Patient Instructions on file for this visit.

## 2018-12-06 NOTE — Progress Notes (Signed)
Nursing Pain Medication Assessment:  Safety precautions to be maintained throughout the outpatient stay will include: orient to surroundings, keep bed in low position, maintain call bell within reach at all times, provide assistance with transfer out of bed and ambulation.  Medication Inspection Compliance: Pill count conducted under aseptic conditions, in front of the patient. Neither the pills nor the bottle was removed from the patient's sight at any time. Once count was completed pills were immediately returned to the patient in their original bottle.  Medication: Buprenorphine (Suboxone) Pill/Patch Count: 19 of 60 patches remain Pill/Patch Appearance: Markings consistent with prescribed medication Bottle Appearance: Standard pharmacy container. Clearly labeled. Filled Date: 8 / 19 / 2019 Last Medication intake:  Today

## 2018-12-31 ENCOUNTER — Encounter: Payer: Self-pay | Admitting: Family Medicine

## 2018-12-31 DIAGNOSIS — R748 Abnormal levels of other serum enzymes: Secondary | ICD-10-CM

## 2018-12-31 DIAGNOSIS — I2510 Atherosclerotic heart disease of native coronary artery without angina pectoris: Secondary | ICD-10-CM

## 2018-12-31 DIAGNOSIS — E785 Hyperlipidemia, unspecified: Secondary | ICD-10-CM

## 2019-01-01 NOTE — Addendum Note (Signed)
Addended by: LADA, Satira Anis on: 01/01/2019 05:16 PM   Modules accepted: Orders

## 2019-01-01 NOTE — Telephone Encounter (Signed)
Clabe Seal, PA-C, Will you please review the patient's chart and last echo and see if a follow-up echo now or sometime soon is appropriate? Thank you!

## 2019-02-08 ENCOUNTER — Encounter: Payer: Self-pay | Admitting: Family Medicine

## 2019-02-08 DIAGNOSIS — E785 Hyperlipidemia, unspecified: Secondary | ICD-10-CM

## 2019-02-08 DIAGNOSIS — R748 Abnormal levels of other serum enzymes: Secondary | ICD-10-CM

## 2019-02-08 DIAGNOSIS — K760 Fatty (change of) liver, not elsewhere classified: Secondary | ICD-10-CM

## 2019-02-08 DIAGNOSIS — I2510 Atherosclerotic heart disease of native coronary artery without angina pectoris: Secondary | ICD-10-CM

## 2019-02-08 DIAGNOSIS — E1165 Type 2 diabetes mellitus with hyperglycemia: Secondary | ICD-10-CM

## 2019-02-13 ENCOUNTER — Encounter: Payer: Self-pay | Admitting: Family Medicine

## 2019-02-13 NOTE — Addendum Note (Signed)
Addended by: LADA, Satira Anis on: 02/13/2019 07:43 PM   Modules accepted: Orders

## 2019-02-14 ENCOUNTER — Other Ambulatory Visit: Payer: Self-pay

## 2019-02-14 DIAGNOSIS — E785 Hyperlipidemia, unspecified: Secondary | ICD-10-CM

## 2019-02-14 DIAGNOSIS — I2510 Atherosclerotic heart disease of native coronary artery without angina pectoris: Secondary | ICD-10-CM

## 2019-02-14 DIAGNOSIS — E1165 Type 2 diabetes mellitus with hyperglycemia: Secondary | ICD-10-CM

## 2019-02-14 DIAGNOSIS — K760 Fatty (change of) liver, not elsewhere classified: Secondary | ICD-10-CM

## 2019-02-15 LAB — COMPLETE METABOLIC PANEL WITH GFR
AG Ratio: 1.9 (calc) (ref 1.0–2.5)
ALT: 23 U/L (ref 9–46)
AST: 24 U/L (ref 10–40)
Albumin: 4.3 g/dL (ref 3.6–5.1)
Alkaline phosphatase (APISO): 88 U/L (ref 36–130)
BUN: 12 mg/dL (ref 7–25)
CHLORIDE: 105 mmol/L (ref 98–110)
CO2: 29 mmol/L (ref 20–32)
Calcium: 9.4 mg/dL (ref 8.6–10.3)
Creat: 0.75 mg/dL (ref 0.60–1.35)
GFR, Est African American: 128 mL/min/{1.73_m2} (ref >=60)
GFR, Est Non African American: 111 mL/min/{1.73_m2} (ref >=60)
Globulin: 2.3 g/dL (calc) (ref 1.9–3.7)
Glucose, Bld: 99 mg/dL (ref 65–99)
Potassium: 4.5 mmol/L (ref 3.5–5.3)
Sodium: 143 mmol/L (ref 135–146)
Total Bilirubin: 0.8 mg/dL (ref 0.2–1.2)
Total Protein: 6.6 g/dL (ref 6.1–8.1)

## 2019-02-15 LAB — HEMOGLOBIN A1C
Hgb A1c MFr Bld: 6.1 % of total Hgb — ABNORMAL HIGH (ref <5.7)
Mean Plasma Glucose: 128 (calc)
eAG (mmol/L): 7.1 (calc)

## 2019-02-20 LAB — CARDIO IQ(R) ADVANCED LIPID PANEL
Apolipoprotein B: 68 mg/dL
Cholesterol: 110 mg/dL (ref <200)
HDL: 32 mg/dL — ABNORMAL LOW (ref >=40)
LDL Cholesterol (Calc): 59 mg/dL (calc) (ref <100)
LDL Large: 4652 nmol/L — ABNORMAL LOW (ref >6729)
LDL Medium: 207 nmol/L (ref <215)
LDL PEAK SIZE: 215 Angstrom — AB (ref >222.9)
LDL Particle Number: 941 nmol/L (ref <1138)
LDL Small: 210 nmol/L — ABNORMAL HIGH (ref <142)
Lipoprotein (a): 223 nmol/L — ABNORMAL HIGH (ref <75)
Non-HDL Cholesterol (Calc): 78 mg/dL (calc) (ref <130)
Total CHOL/HDL Ratio: 3.4 calc (ref <5.0)
Triglycerides: 108 mg/dL (ref <150)

## 2019-02-24 ENCOUNTER — Encounter: Payer: Self-pay | Admitting: Family Medicine

## 2019-02-24 DIAGNOSIS — E785 Hyperlipidemia, unspecified: Secondary | ICD-10-CM

## 2019-02-24 DIAGNOSIS — E1165 Type 2 diabetes mellitus with hyperglycemia: Secondary | ICD-10-CM

## 2019-02-24 DIAGNOSIS — I2510 Atherosclerotic heart disease of native coronary artery without angina pectoris: Secondary | ICD-10-CM

## 2019-03-07 ENCOUNTER — Encounter: Payer: 59 | Admitting: Student in an Organized Health Care Education/Training Program

## 2019-03-20 ENCOUNTER — Other Ambulatory Visit: Payer: Self-pay | Admitting: Student in an Organized Health Care Education/Training Program

## 2019-03-24 ENCOUNTER — Other Ambulatory Visit: Payer: Self-pay

## 2019-03-24 ENCOUNTER — Observation Stay
Admission: EM | Admit: 2019-03-24 | Discharge: 2019-03-25 | Disposition: A | Discharge status: Home / Self Care | Payer: No Typology Code available for payment source | Attending: Internal Medicine | Admitting: Internal Medicine

## 2019-03-24 ENCOUNTER — Emergency Department: Payer: No Typology Code available for payment source

## 2019-03-24 ENCOUNTER — Encounter: Payer: Self-pay | Admitting: Radiology

## 2019-03-24 DIAGNOSIS — Z7982 Long term (current) use of aspirin: Secondary | ICD-10-CM | POA: Diagnosis not present

## 2019-03-24 DIAGNOSIS — K219 Gastro-esophageal reflux disease without esophagitis: Secondary | ICD-10-CM | POA: Diagnosis not present

## 2019-03-24 DIAGNOSIS — Z981 Arthrodesis status: Secondary | ICD-10-CM | POA: Insufficient documentation

## 2019-03-24 DIAGNOSIS — Z79899 Other long term (current) drug therapy: Secondary | ICD-10-CM | POA: Diagnosis not present

## 2019-03-24 DIAGNOSIS — I700 Atherosclerosis of aorta: Secondary | ICD-10-CM | POA: Insufficient documentation

## 2019-03-24 DIAGNOSIS — M5136 Other intervertebral disc degeneration, lumbar region: Secondary | ICD-10-CM | POA: Insufficient documentation

## 2019-03-24 DIAGNOSIS — I10 Essential (primary) hypertension: Secondary | ICD-10-CM | POA: Diagnosis present

## 2019-03-24 DIAGNOSIS — M109 Gout, unspecified: Secondary | ICD-10-CM | POA: Diagnosis not present

## 2019-03-24 DIAGNOSIS — R079 Chest pain, unspecified: Secondary | ICD-10-CM | POA: Diagnosis present

## 2019-03-24 DIAGNOSIS — E119 Type 2 diabetes mellitus without complications: Secondary | ICD-10-CM | POA: Insufficient documentation

## 2019-03-24 DIAGNOSIS — G8929 Other chronic pain: Secondary | ICD-10-CM | POA: Insufficient documentation

## 2019-03-24 DIAGNOSIS — Z794 Long term (current) use of insulin: Secondary | ICD-10-CM | POA: Insufficient documentation

## 2019-03-24 DIAGNOSIS — R0789 Other chest pain: Principal | ICD-10-CM | POA: Insufficient documentation

## 2019-03-24 DIAGNOSIS — I2510 Atherosclerotic heart disease of native coronary artery without angina pectoris: Secondary | ICD-10-CM | POA: Insufficient documentation

## 2019-03-24 DIAGNOSIS — M5030 Other cervical disc degeneration, unspecified cervical region: Secondary | ICD-10-CM | POA: Insufficient documentation

## 2019-03-24 DIAGNOSIS — R748 Abnormal levels of other serum enzymes: Secondary | ICD-10-CM

## 2019-03-24 DIAGNOSIS — Z87891 Personal history of nicotine dependence: Secondary | ICD-10-CM | POA: Insufficient documentation

## 2019-03-24 DIAGNOSIS — K589 Irritable bowel syndrome without diarrhea: Secondary | ICD-10-CM | POA: Diagnosis not present

## 2019-03-24 DIAGNOSIS — E785 Hyperlipidemia, unspecified: Secondary | ICD-10-CM | POA: Diagnosis not present

## 2019-03-24 DIAGNOSIS — R000 Tachycardia, unspecified: Secondary | ICD-10-CM | POA: Diagnosis present

## 2019-03-24 DIAGNOSIS — I252 Old myocardial infarction: Secondary | ICD-10-CM | POA: Diagnosis not present

## 2019-03-24 DIAGNOSIS — K760 Fatty (change of) liver, not elsewhere classified: Secondary | ICD-10-CM | POA: Insufficient documentation

## 2019-03-24 DIAGNOSIS — Z8249 Family history of ischemic heart disease and other diseases of the circulatory system: Secondary | ICD-10-CM | POA: Insufficient documentation

## 2019-03-24 DIAGNOSIS — Z955 Presence of coronary angioplasty implant and graft: Secondary | ICD-10-CM | POA: Diagnosis not present

## 2019-03-24 LAB — CBC WITH DIFFERENTIAL/PLATELET
Abs Immature Granulocytes: 0.05 10*3/uL (ref 0.00–0.07)
Basophils Absolute: 0 10*3/uL (ref 0.0–0.1)
Basophils Relative: 0 %
EOS PCT: 1 %
Eosinophils Absolute: 0.1 10*3/uL (ref 0.0–0.5)
HCT: 50.2 % (ref 39.0–52.0)
Hemoglobin: 16.6 g/dL (ref 13.0–17.0)
Immature Granulocytes: 0 %
Lymphocytes Relative: 5 %
Lymphs Abs: 0.6 10*3/uL — ABNORMAL LOW (ref 0.7–4.0)
MCH: 26.9 pg (ref 26.0–34.0)
MCHC: 33.1 g/dL (ref 30.0–36.0)
MCV: 81.5 fL (ref 80.0–100.0)
Monocytes Absolute: 0.8 10*3/uL (ref 0.1–1.0)
Monocytes Relative: 7 %
Neutro Abs: 10.7 10*3/uL — ABNORMAL HIGH (ref 1.7–7.7)
Neutrophils Relative %: 87 %
Platelets: 180 10*3/uL (ref 150–400)
RBC: 6.16 MIL/uL — ABNORMAL HIGH (ref 4.22–5.81)
RDW: 14.1 % (ref 11.5–15.5)
WBC: 12.3 10*3/uL — ABNORMAL HIGH (ref 4.0–10.5)
nRBC: 0 % (ref 0.0–0.2)

## 2019-03-24 LAB — TROPONIN I
TROPONIN I: 0.03 ng/mL — AB (ref <0.03)
Troponin I: 0.03 ng/mL (ref <0.03)
Troponin I: 0.03 ng/mL (ref <0.03)
Troponin I: 0.03 ng/mL (ref <0.03)
Troponin I: 0.03 ng/mL (ref <0.03)

## 2019-03-24 LAB — COMPREHENSIVE METABOLIC PANEL
ALT: 46 U/L — ABNORMAL HIGH (ref 0–44)
AST: 47 U/L — ABNORMAL HIGH (ref 15–41)
Albumin: 4.4 g/dL (ref 3.5–5.0)
Alkaline Phosphatase: 84 U/L (ref 38–126)
Anion gap: 16 — ABNORMAL HIGH (ref 5–15)
BUN: 14 mg/dL (ref 6–20)
CO2: 20 mmol/L — ABNORMAL LOW (ref 22–32)
Calcium: 9.3 mg/dL (ref 8.9–10.3)
Chloride: 104 mmol/L (ref 98–111)
Creatinine, Ser: 0.91 mg/dL (ref 0.61–1.24)
GFR calc Af Amer: >60 mL/min (ref >60)
GFR calc non Af Amer: >60 mL/min (ref >60)
GLUCOSE: 154 mg/dL — AB (ref 70–99)
Potassium: 4.5 mmol/L (ref 3.5–5.1)
Sodium: 140 mmol/L (ref 135–145)
TOTAL PROTEIN: 7.6 g/dL (ref 6.5–8.1)
Total Bilirubin: 2.7 mg/dL — ABNORMAL HIGH (ref 0.3–1.2)

## 2019-03-24 LAB — URINE DRUG SCREEN, QUALITATIVE (ARMC ONLY)
Amphetamines, Ur Screen: NOT DETECTED
Barbiturates, Ur Screen: NOT DETECTED
Benzodiazepine, Ur Scrn: NOT DETECTED
Cannabinoid 50 Ng, Ur ~~LOC~~: NOT DETECTED
Cocaine Metabolite,Ur ~~LOC~~: NOT DETECTED
MDMA (Ecstasy)Ur Screen: NOT DETECTED
Methadone Scn, Ur: NOT DETECTED
Opiate, Ur Screen: POSITIVE — AB
PHENCYCLIDINE (PCP) UR S: NOT DETECTED
Tricyclic, Ur Screen: NOT DETECTED

## 2019-03-24 LAB — GLUCOSE, CAPILLARY
Glucose-Capillary: 132 mg/dL — ABNORMAL HIGH (ref 70–99)
Glucose-Capillary: 110 mg/dL — ABNORMAL HIGH (ref 70–99)
Glucose-Capillary: 116 mg/dL — ABNORMAL HIGH (ref 70–99)

## 2019-03-24 LAB — TSH: TSH: 1.487 u[IU]/mL (ref 0.350–4.500)

## 2019-03-24 LAB — FIBRIN DERIVATIVES D-DIMER (ARMC ONLY): Fibrin derivatives D-dimer (ARMC): 1143.44 ng/mL (FEU) — ABNORMAL HIGH (ref 0.00–499.00)

## 2019-03-24 MED ORDER — METOPROLOL TARTRATE 5 MG/5ML IV SOLN
INTRAVENOUS | Status: AC
  Filled 2019-03-24: qty 5

## 2019-03-24 MED ORDER — IOHEXOL 350 MG/ML SOLN
75.0000 mL | Freq: Once | INTRAVENOUS | Status: AC | PRN
  Administered 2019-03-24: 75 mL via INTRAVENOUS

## 2019-03-24 MED ORDER — ALUM & MAG HYDROXIDE-SIMETH 200-200-20 MG/5ML PO SUSP
30.0000 mL | Freq: Once | ORAL | Status: AC
  Administered 2019-03-24: 30 mL via ORAL
  Filled 2019-03-24: qty 30

## 2019-03-24 MED ORDER — METOPROLOL TARTRATE 5 MG/5ML IV SOLN
5.0000 mg | Freq: Once | INTRAVENOUS | Status: AC
  Administered 2019-03-24: 5 mg via INTRAVENOUS

## 2019-03-24 MED ORDER — INSULIN ASPART 100 UNIT/ML ~~LOC~~ SOLN
0.0000 [IU] | Freq: Three times a day (TID) | SUBCUTANEOUS | Status: DC
  Administered 2019-03-24: 2 [IU] via SUBCUTANEOUS

## 2019-03-24 MED ORDER — MELATONIN 5 MG PO TABS
5.0000 mg | ORAL_TABLET | Freq: Every day | ORAL | Status: DC
  Administered 2019-03-24: 5 mg via ORAL
  Filled 2019-03-24 (×2): qty 1

## 2019-03-24 MED ORDER — ALLOPURINOL 100 MG PO TABS
100.0000 mg | ORAL_TABLET | Freq: Every day | ORAL | Status: DC
  Administered 2019-03-24: 100 mg via ORAL
  Filled 2019-03-24 (×2): qty 1

## 2019-03-24 MED ORDER — ASPIRIN EC 81 MG PO TBEC
81.0000 mg | DELAYED_RELEASE_TABLET | Freq: Every day | ORAL | Status: DC
  Administered 2019-03-24: 81 mg via ORAL
  Filled 2019-03-24: qty 1

## 2019-03-24 MED ORDER — ACETAMINOPHEN 325 MG PO TABS
650.0000 mg | ORAL_TABLET | ORAL | Status: DC | PRN
  Administered 2019-03-24: 650 mg via ORAL
  Filled 2019-03-24: qty 2

## 2019-03-24 MED ORDER — LIDOCAINE VISCOUS HCL 2 % MT SOLN
15.0000 mL | Freq: Once | OROMUCOSAL | Status: DC
  Filled 2019-03-24: qty 15

## 2019-03-24 MED ORDER — SODIUM CHLORIDE 0.45 % IV SOLN
INTRAVENOUS | Status: DC
  Administered 2019-03-24 – 2019-03-25 (×2): via INTRAVENOUS

## 2019-03-24 MED ORDER — MORPHINE SULFATE (PF) 2 MG/ML IV SOLN
2.0000 mg | INTRAVENOUS | Status: DC | PRN
  Administered 2019-03-24 – 2019-03-25 (×2): 2 mg via INTRAVENOUS
  Filled 2019-03-24 (×2): qty 1

## 2019-03-24 MED ORDER — MORPHINE SULFATE (PF) 4 MG/ML IV SOLN
4.0000 mg | Freq: Once | INTRAVENOUS | Status: AC
  Administered 2019-03-24: 4 mg via INTRAVENOUS

## 2019-03-24 MED ORDER — ENOXAPARIN SODIUM 40 MG/0.4ML ~~LOC~~ SOLN
40.0000 mg | SUBCUTANEOUS | Status: DC
  Administered 2019-03-24: 40 mg via SUBCUTANEOUS
  Filled 2019-03-24: qty 0.4

## 2019-03-24 MED ORDER — PREGABALIN 50 MG PO CAPS
50.0000 mg | ORAL_CAPSULE | Freq: Three times a day (TID) | ORAL | Status: DC
  Administered 2019-03-24 – 2019-03-25 (×3): 50 mg via ORAL
  Filled 2019-03-24 (×3): qty 1

## 2019-03-24 MED ORDER — MORPHINE SULFATE (PF) 4 MG/ML IV SOLN
INTRAVENOUS | Status: AC
  Filled 2019-03-24: qty 1

## 2019-03-24 MED ORDER — ATORVASTATIN CALCIUM 20 MG PO TABS
80.0000 mg | ORAL_TABLET | Freq: Every day | ORAL | Status: DC
  Administered 2019-03-24: 80 mg via ORAL
  Filled 2019-03-24: qty 4

## 2019-03-24 MED ORDER — NITROGLYCERIN 2 % TD OINT
1.0000 [in_us] | TOPICAL_OINTMENT | Freq: Once | TRANSDERMAL | Status: AC
  Administered 2019-03-24: 1 [in_us] via TOPICAL

## 2019-03-24 MED ORDER — METOPROLOL TARTRATE 25 MG PO TABS
25.0000 mg | ORAL_TABLET | Freq: Two times a day (BID) | ORAL | Status: DC
  Administered 2019-03-24 (×2): 25 mg via ORAL
  Filled 2019-03-24 (×2): qty 1

## 2019-03-24 MED ORDER — ONDANSETRON HCL 4 MG/2ML IJ SOLN
4.0000 mg | Freq: Once | INTRAMUSCULAR | Status: AC
  Administered 2019-03-24: 4 mg via INTRAVENOUS

## 2019-03-24 MED ORDER — ONDANSETRON HCL 4 MG/2ML IJ SOLN
4.0000 mg | Freq: Once | INTRAMUSCULAR | Status: DC
  Filled 2019-03-24: qty 2

## 2019-03-24 MED ORDER — METOPROLOL TARTRATE 5 MG/5ML IV SOLN
5.0000 mg | Freq: Once | INTRAVENOUS | Status: AC
  Administered 2019-03-24: 5 mg via INTRAVENOUS
  Filled 2019-03-24: qty 5

## 2019-03-24 MED ORDER — LORAZEPAM 2 MG/ML IJ SOLN
1.0000 mg | Freq: Once | INTRAMUSCULAR | Status: AC
  Administered 2019-03-24: 1 mg via INTRAVENOUS
  Filled 2019-03-24: qty 1

## 2019-03-24 MED ORDER — PROMETHAZINE HCL 25 MG/ML IJ SOLN
12.5000 mg | Freq: Once | INTRAMUSCULAR | Status: AC
  Administered 2019-03-24: 12.5 mg via INTRAVENOUS
  Filled 2019-03-24: qty 1

## 2019-03-24 MED ORDER — ENOXAPARIN SODIUM 100 MG/ML ~~LOC~~ SOLN: 1.0000 mg/kg | Freq: Two times a day (BID) | SUBCUTANEOUS | Status: DC

## 2019-03-24 MED ORDER — TICAGRELOR 90 MG PO TABS
90.0000 mg | ORAL_TABLET | Freq: Two times a day (BID) | ORAL | Status: DC
  Administered 2019-03-24 – 2019-03-25 (×3): 90 mg via ORAL
  Filled 2019-03-24 (×3): qty 1

## 2019-03-24 MED ORDER — ONDANSETRON HCL 4 MG/2ML IJ SOLN
4.0000 mg | Freq: Four times a day (QID) | INTRAMUSCULAR | Status: DC | PRN
  Administered 2019-03-25 (×2): 4 mg via INTRAVENOUS
  Filled 2019-03-24 (×3): qty 2

## 2019-03-24 NOTE — ED Provider Notes (Signed)
[facility] Emergency Department Provider Note   ____________________________________________   First MD Initiated Contact with Patient 03/24/19 [phone]     (approximate)  I have reviewed the triage vital signs and the nursing notes.   HISTORY  Chief Complaint Chest Pain   HPI John Hartman is a 48 y.o. male patient reports having an NSTEMI with a stent on August 28.  He saw Dr. Saralyn Pilar at that time.  Today he was eating some carryout when he began having vomiting at about 930 he vomited several times over the course of few hours and then developed sharp stabbing chest pain that waxed and waned.  Is in the center of his chest and did not radiate to the back.  He developed some left arm numbness and face numbness as well.  He said the pain felt like his previous MI.  On arrival here he had a heart rate in the 150s with a blood pressure in the 923 systolic.  Reports he has a rapid heartbeat and takes metoprolol 25 twice a day for it.  EMS gave him some fentanyl as well.  Their EKG showed some lateral ST segment depression.  There was no ST segment elevation.         Past Medical History:  Diagnosis Date   Angioedema    Bronchitis    DDD (degenerative disc disease), cervical    Diabetes mellitus ([place])    Fatty liver 03/31/2017   [place] April 2018   GERD (gastroesophageal reflux disease)    Gout    Heart attack ([place])    HTN (hypertension)    Hypertension    CONTROLLED ON MEDS   IBS (irritable bowel syndrome)    LVH (left ventricular hypertrophy) 11/24/2018   Moderate, ECHO   Myocardial infarction ([place]) 08/23/2018   Seasonal allergies    Spinal stenosis     Patient Active Problem List   Diagnosis Date Noted   Status post myocardial infarction 11/24/2018   LVH (left ventricular hypertrophy) 11/24/2018   Retrolisthesis of vertebrae 11/24/2018   NSTEMI (non-ST elevated myocardial infarction) ([place]) 08/22/2018   Elevated hemoglobin (H[facility])  08/11/2017   Chronic lower back pain 08/04/2017   Degenerative disc disease, lumbar 08/04/2017   Arthritis, 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   Calcification of abdominal aorta ([place]) 08/04/2017   Fatty liver 03/31/2017   Elevated liver enzymes 02/11/2017   Serum total bilirubin elevated 02/11/2017   Hyperlipidemia LDL goal <70 01/04/2017   Elevated serum glutamic pyruvic transaminase (SGPT) level 01/04/2017   Angioedema    Encounter for medication monitoring 08/25/2016   Coronary artery disease 08/25/2016   Uncontrolled diabetes mellitus with hyperglycemia ([place])    Chest pain 08/08/2016   Preventative health care 08/02/2016   Ketonuria 07/12/2016   Glucosuria 07/12/2016   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/2015   Palpitations 08/12/2015   Family history of malignant neoplasm of gastrointestinal tract    Benign neoplasm of rectosigmoid junction    Essential hypertension 05/28/2015   Gout 05/28/2015   IBS (irritable bowel syndrome) 05/28/2015    Past Surgical History:  Procedure Laterality Date   ACDF with fusion  2007   CARDIAC CATHETERIZATION Left 08/15/2016   Procedure: Left Heart Cath and Coronary Angiography;  Surgeon: Wellington Hampshire, MD;  Location: [facility] [place] [facility];  Service: Cardiovascular;  Laterality: Left;   COLONOSCOPY WITH PROPOFOL N/A 06/26/2015   Procedure: COLONOSCOPY WITH PROPOFOL;  Surgeon: Lucilla Lame, MD;  Location: [place];  Service: Endoscopy;  Laterality: N/A;  with biopsies   CORONARY/GRAFT ACUTE MI REVASCULARIZATION N/A 08/23/2018   Procedure: Coronary/Graft Acute MI Revascularization;  Surgeon: Isaias Cowman, MD;  Location: [place];  Service: Cardiovascular;  Laterality: N/A;   LEFT HEART CATH AND CORONARY ANGIOGRAPHY N/A 08/23/2018   Procedure: LEFT HEART CATH AND CORONARY ANGIOGRAPHY;  Surgeon: Isaias Cowman, MD;  Location: [place] [facility];  Service: Cardiovascular;  Laterality: N/A;   NASAL SEPTUM  SURGERY  2004   SHOULDER SURGERY Left 2007   Testicular torsion  1980s   VASECTOMY      Prior to Admission medications   Medication Sig Start Date End Date Taking? Authorizing Provider  ACCU-CHEK SOFTCLIX LANCETS lancets USE AS DIRECTED 07/23/16   Arnetha Courser, MD  allopurinol (ZYLOPRIM) 100 MG tablet Take 100 mg by mouth every morning.     [provider]  aspirin EC 81 MG tablet Take 81 mg by mouth daily.    [provider]  atorvastatin (LIPITOR) 80 MG tablet Take 1 tablet (80 mg total) by mouth daily at 6 PM. 08/25/18 12/06/18  Pyreddy, Reatha Harps, MD  [place] alk-PHENObarbital (DONNATAL) 16.2 MG tablet Take 1 tablet by mouth as needed (pain).    [provider]  cetirizine (ZYRTEC) 10 MG tablet Take 10 mg by mouth at bedtime.     [provider]  colchicine 0.6 MG tablet Take 0.6 mg by mouth daily as needed (gout).     [provider]  Dapagliflozin-metFORMIN HCl ER (XIGDUO XR) 04-999 MG TB24 Take 10-200 mg by mouth daily.    [provider]  diphenhydrAMINE (BENADRYL) 25 mg capsule Take 50 mg by mouth at bedtime.     [provider]  EPINEPHrine (EPIPEN 2-PAK) 0.3 mg/0.3 mL IJ SOAJ injection Inject 0.3 mg into the muscle once.    [provider]  ezetimibe (ZETIA) 10 MG tablet Take 1 tablet (10 mg total) by mouth daily. 12/03/18   Arnetha Courser, MD  glucose blood (ACCU-CHEK ACTIVE STRIPS) test strip Use as instructed 07/09/16   Arnetha Courser, MD  glucose blood (ACCU-CHEK AVIVA PLUS) test strip Check FSBS three to four times per day 07/23/16   Arnetha Courser, MD  hydrALAZINE (APRESOLINE) 25 MG tablet Take 1 tablet (25 mg total) by mouth 2 (two) times daily. 08/25/18 12/06/18  Saundra Shelling, MD  hydrochlorothiazide (HYDRODIURIL) 12.5 MG tablet Take 12.5 mg by mouth daily.    [provider]  indomethacin (INDOCIN) 25 MG capsule Take 25 mg by mouth 2 (two) times daily as needed for mild pain.    [provider]  Insulin Degludec (TRESIBA FLEXTOUCH) 100 UNIT/ML SOPN Inject 34 Units into the skin at bedtime.  07/12/16   Lada, Satira Anis, MD  Insulin Pen Needle (B-D UF III MINI PEN NEEDLES) 31G X 5 MM MISC Once a day with insulin 07/09/16   Lada, Satira Anis, MD  Melatonin 5 MG TABS Take 5 mg by mouth at bedtime.     [provider]  metoprolol tartrate (LOPRESSOR) 50 MG tablet Take 1 tablet (50 mg total) by mouth 2 (two) times daily. Patient taking differently: Take 25 mg by mouth 2 (two) times daily.  08/25/18 12/06/18  Saundra Shelling, MD  nitroGLYCERIN (NITROSTAT) 0.4 MG SL tablet Place 1 tablet (0.4 mg total) under the tongue every 5 (five) minutes as needed for chest pain. 08/25/18   Saundra Shelling, MD  oxyCODONE-acetaminophen (PERCOCET) 7.5-325 MG tablet Take 1 tablet by mouth 3 (three)  times daily as needed for severe pain. Patient not taking: Reported on 11/13/2018 08/09/18   Gillis Santa, MD  pregabalin (LYRICA) 50 MG capsule Take 1 capsule (50 mg total) by mouth 3 (three) times daily. 09/18/18   Gillis Santa, MD  ranitidine (ZANTAC) 150 MG capsule Take 150 mg by mouth at bedtime.     [provider]  ticagrelor (BRILINTA) 90 MG TABS tablet Take 90 mg by mouth 2 (two) times daily.    [provider]  tiZANidine (ZANAFLEX) 4 MG tablet Take 1 tablet (4 mg total) by mouth every 8 (eight) hours as needed for muscle spasms. 09/18/18   Gillis Santa, MD    Allergies Lisinopril and Ace inhibitors  Family History  Problem Relation Age of Onset   Breast cancer Mother    Skin cancer Mother    Arrhythmia Mother        A-fib   Cancer Mother        breast, colon?, skin   Hypertension Father    Diabetes Father    Cancer Maternal Grandfather    Heart disease Brother        stent   Allergies Son    Alzheimer's disease Maternal Grandmother    Stroke Neg Hx    COPD Neg Hx     Social History Social History   Tobacco Use   Smoking status: Former  Smoker    Packs/day: 1.00    Years: 10.00    Pack years: 10.00    Types: Cigarettes    Last attempt to quit: 04/15/2015    Years since quitting: 3.9   Smokeless tobacco: Never Used  Substance Use Topics   Alcohol use: No    Alcohol/week: 0.0 standard drinks   Drug use: No    Review of Systems  Constitutional: No fever/chills Eyes: No visual changes. ENT: No sore throat. Cardiovascular:  chest pain. Respiratory: Some shortness of breath. Gastrointestinal: No abdominal pain.  No nausea, no vomiting.  No diarrhea.  No constipation. Genitourinary: Negative for dysuria. Musculoskeletal: Negative for back pain. Skin: Negative for rash. Neurological: Negative for headaches, focal weakness  ____________________________________________   PHYSICAL EXAM:  VITAL SIGNS: ED Triage Vitals  Enc Vitals Group     BP 03/24/19 0253 (!) 189/130     Pulse Rate 03/24/19 0253 (!) 150     Resp 03/24/19 0253 (!) 24     Temp 03/24/19 0253 98.6 F (37 C)     Temp Source 03/24/19 0253 Oral     SpO2 03/24/19 0253 99 %     Weight 03/24/19 0254 200 lb (90.7 kg)     Height 03/24/19 0254 6' (1.829 m)     Head Circumference --      Peak Flow --      Pain Score 03/24/19 0254 10     Pain Loc --      Pain Edu? --      Excl. in [place]? --     Constitutional: Alert and oriented.  Very anxious and in pain Eyes: Conjunctivae are normal.  Head: Atraumatic. Nose: No congestion/rhinnorhea. Mouth/Throat: Mucous membranes are moist.  Oropharynx non-erythematous. Neck: No stridor.  Cardiovascular: Normal rate, regular rhythm. Grossly normal heart sounds.  Good peripheral circulation. Respiratory: Normal respiratory effort.  No retractions. Lungs CTAB. Gastrointestinal: Soft and nontender. No distention. No abdominal bruits. No CVA tenderness. Musculoskeletal: No lower extremity tenderness nor edema.  No joint effusions. Neurologic:  Normal speech and language. No gross focal neurologic deficits are  appreciated. No gait instability. Skin:  Skin is warm, dry and intact. No rash noted. Psychiatric: Mood and affect are normal. Speech and behavior are normal.  ____________________________________________   LABS (all labs ordered are listed, but only abnormal results are displayed)  Labs Reviewed  CBC WITH DIFFERENTIAL/PLATELET - Abnormal; Notable for the following components:      Result Value   WBC 12.3 (*)    RBC 6.16 (*)    Neutro Abs 10.7 (*)    Lymphs Abs 0.6 (*)    All other components within normal limits  COMPREHENSIVE METABOLIC PANEL - Abnormal; Notable for the following components:   CO2 20 (*)    Glucose, Bld 154 (*)    AST 47 (*)    ALT 46 (*)    Total Bilirubin 2.7 (*)    Anion gap 16 (*)    All other components within normal limits  TROPONIN I - Abnormal; Notable for the following components:   Troponin I 0.03 (*)    All other components within normal limits  FIBRIN DERIVATIVES D-DIMER ([facility] ONLY) - Abnormal; Notable for the following components:   Fibrin derivatives D-dimer [facility]) 1,143.44 (*)    All other components within normal limits  TROPONIN I - Abnormal; Notable for the following components:   Troponin I 0.03 (*)    All other components within normal limits   ____________________________________________  EKG  KG read interpreted by me shows sinus tachycardia rate of 150 normal axis some slight hint of ST segment depression in 1 flattening of the T waves in 3 and F.  Some slight elevation in aVR.  Right-sided EKG was done read and interpreted by me shows sinus tach at a rate of 150 normal axis there is a small amount of ST elevation in V5 and V6 which would correspond to V2 are in V3 ER. Left-sided leads were also done which shows sinus tachycardia at 159 normal axis flattening of the T waves in V7,8 & 9. ____________________________________________  RADIOLOGY  ED MD interpretation: Chest x-ray read by radiology reviewed by me shows no acute  disease  Official radiology report(s): Dg Chest 1 View  Result Date: 03/24/2019 CLINICAL DATA:  Initial evaluation for acute chest pain. EXAM: CHEST  1 VIEW COMPARISON:  Prior radiograph from 08/22/2018 FINDINGS: Transverse heart size within normal limits. Mediastinal silhouette normal. Defibrillator pads overlie the left chest. Lungs normally inflated. No focal infiltrates. No pulmonary edema or pleural effusion. No pneumothorax. No acute osseous finding.  Cervical ACDF noted. IMPRESSION: No active cardiopulmonary disease. Electronically Signed   By: Jeannine Boga M.D.   On: 03/24/2019 03:17   Ct Angio Chest Pe W And/or Wo Contrast  Result Date: 03/24/2019 CLINICAL DATA:  Chest pain, vomiting EXAM: CT ANGIOGRAPHY CHEST WITH CONTRAST TECHNIQUE: Multidetector CT imaging of the chest was performed using the standard protocol during bolus administration of intravenous contrast. Multiplanar CT image reconstructions and MIPs were obtained to evaluate the vascular anatomy. CONTRAST:  24m OMNIPAQUE IOHEXOL 350 MG/ML SOLN COMPARISON:  Chest radiograph dated 03/24/2019. CT chest dated 08/22/2018. FINDINGS: Cardiovascular: Satisfactory opacification of the bilateral pulmonary arteries to the subsegmental level. No evidence of pulmonary embolism. Although not tailored for evaluation of the thoracic aorta, there is no evidence of thoracic aortic aneurysm or dissection. Atherosclerotic calcifications of the aortic arch. The heart is normal in size.  No pericardial effusion. Coronary atherosclerosis the LAD and left circumflex. Mediastinum/Nodes: No suspicious mediastinal lymphadenopathy. Visualized thyroid is unremarkable. Lungs/Pleura: Lungs are essentially clear.  3 mm subpleural nodule in the superior segment left lower lobe (series 7/image 45), stable versus mildly decreased, benign. Mild dependent atelectasis in the bilateral lower lobes. No focal consolidation. No pleural effusion or pneumothorax. Upper  Abdomen: Visualized upper abdomen is grossly unremarkable. Musculoskeletal: Cervical spine fixation hardware, incompletely visualized. Review of the MIP images confirms the above findings. IMPRESSION: No evidence pulmonary embolism. No evidence of acute cardiopulmonary disease. Aortic Atherosclerosis (ICD10-I70.0). Electronically Signed   By: Julian Hy M.D.   On: 03/24/2019 05:01    ____________________________________________   PROCEDURES  Procedure(s) performed (including Critical Care):  Procedures Discussed patient and EKGs with Dr. Rosalva Ferron.  He is not worried.  ____________________________________________   INITIAL IMPRESSION / ASSESSMENT AND PLAN / ED COURSE  Patient given 5 metoprolol this brings his blood pressure down to 1 24-2 50 systolic and his heart rate down to 125.  Give him another 5 metoprolol little bit of Ativan as he is breathing very quickly and very anxious.  We will await his lab work.  He had aspirin with EMS and has an inch of Nitropaste on.  Pain is improving slowly.    ----------------------------------------- 5:33 AM on 03/24/2019 -----------------------------------------  Patient's troponins are mildly elevated but unchanging.  His d-dimer is elevated but his CT angios is negative.  He however remains very tachycardic in spite of 50 mg of IV metoprolol and his blood pressure still remains elevated.  We will get him in the hospital and attempt to treat his hypertension tachycardia and further evaluate his chest pain.          ____________________________________________   FINAL CLINICAL IMPRESSION(S) / ED DIAGNOSES  Final diagnoses:  Chest pain, unspecified type  Tachycardia  Hypertension, unspecified type     ED Discharge Orders    None       Note:  This document was prepared using Dragon voice recognition software and may include unintentional dictation errors.    Nena Polio, MD 03/24/19 [phone]

## 2019-03-24 NOTE — Consult Note (Signed)
[facility] Cardiology  CARDIOLOGY CONSULT NOTE  Patient ID: Trevaughn Schear MRN: [id number] DOB/AGE: Dec 08, 1971 48 y.o.  Admit date: 03/24/2019 Referring [place] Primary Physician [place] Primary Cardiologist Jevaeh Shams Reason for Consultation chest pain  HPI: 48 year old gentleman referred for evaluation of chest pain.  The patient has known coronary artery disease, status post inferior STEMI and primary PCI DES mid left circumflex 08/23/2018.  He was in his usual state of health until last evening, when he experienced nausea and vomiting x4.  Patient then developed tingling in his face and left arm, headache, and intermittent sharp chest pain.  To [facility] emergency room where ECG revealed sinus tachycardia without acute ischemic ST-T wave changes.  Patient labs were notable for troponin 0 0.03, 0.03.  And was elevated to 12,300, d-dimer was elevated, and CT scan was performed which ruled out pulmonary embolus.  He now complains of intermittent comfort.  Telemetry reveals sinus tachycardia rate of 119 bpm.  Review of systems complete and found to be negative unless listed above     Past Medical History:  Diagnosis Date  . Angioedema   . Bronchitis   . DDD (degenerative disc disease), cervical   . Diabetes mellitus ([place])   . Fatty liver 03/31/2017   [place] April 2018  . GERD (gastroesophageal reflux disease)   . Gout   . Heart attack ([place])   . HTN (hypertension)   . Hypertension    CONTROLLED ON MEDS  . IBS (irritable bowel syndrome)   . LVH (left ventricular hypertrophy) 11/24/2018   Moderate, ECHO  . Myocardial infarction ([place]) 08/23/2018  . Seasonal allergies   . Spinal stenosis     Past Surgical History:  Procedure Laterality Date  . ACDF with fusion  2007  . CARDIAC CATHETERIZATION Left 08/15/2016   Procedure: Left Heart Cath and Coronary Angiography;  Surgeon: Wellington Hampshire, MD;  Location: [place] [facility];  Service: Cardiovascular;  Laterality: Left;  . COLONOSCOPY WITH  PROPOFOL N/A 06/26/2015   Procedure: COLONOSCOPY WITH PROPOFOL;  Surgeon: Lucilla Lame, MD;  Location: [place];  Service: Endoscopy;  Laterality: N/A;  with biopsies  . CORONARY/GRAFT ACUTE MI REVASCULARIZATION N/A 08/23/2018   Procedure: Coronary/Graft Acute MI Revascularization;  Surgeon: Isaias Cowman, MD;  Location: [place] [facility];  Service: Cardiovascular;  Laterality: N/A;  . LEFT HEART CATH AND CORONARY ANGIOGRAPHY N/A 08/23/2018   Procedure: LEFT HEART CATH AND CORONARY ANGIOGRAPHY;  Surgeon: Isaias Cowman, MD;  Location: [place] [facility];  Service: Cardiovascular;  Laterality: N/A;  . NASAL SEPTUM SURGERY  2004  . SHOULDER SURGERY Left 2007  . Testicular torsion  1980s  . VASECTOMY      Medications Prior to Admission  Medication Sig Dispense Refill Last Dose  . allopurinol (ZYLOPRIM) 100 MG tablet Take 100 mg by mouth daily.    Unknown at Unknown  . atorvastatin (LIPITOR) 80 MG tablet Take 1 tablet (80 mg total) by mouth daily at 6 PM. 30 tablet 0 Unknown at Unknown  . BELBUCA 450 MCG FILM PLACE 450 MCG INSIDE CHEEK 2 (TWO) TIMES DAILY.   Unknown at Unknown  . Dapagliflozin-metFORMIN HCl ER (XIGDUO XR) 04-999 MG TB24 Take 2 tablets by mouth daily.    Unknown at Unknown  . ezetimibe (ZETIA) 10 MG tablet Take 1 tablet (10 mg total) by mouth daily. 30 tablet 11 Unknown at Unknown  . hydrALAZINE (APRESOLINE) 25 MG tablet Take 1 tablet (25 mg total) by mouth 2 (two) times daily. 60 tablet  0 Unknown at Unknown  . hydrochlorothiazide (MICROZIDE) 12.5 MG capsule Take 1 capsule by mouth daily.   Unknown at Unknown  . Insulin Degludec (TRESIBA FLEXTOUCH) 100 UNIT/ML SOPN Inject 34 Units into the skin at bedtime.  1 pen 0 Unknown at Unknown  . metoprolol tartrate (LOPRESSOR) 50 MG tablet Take 1 tablet (50 mg total) by mouth 2 (two) times daily. (Patient taking differently: Take 25 mg by mouth 2 (two) times daily. ) 60 tablet 0 Unknown at Unknown  . nitroGLYCERIN  (NITROSTAT) 0.4 MG SL tablet Place 1 tablet (0.4 mg total) under the tongue every 5 (five) minutes as needed for chest pain. 30 tablet 0 prn at prn  . ticagrelor (BRILINTA) 90 MG TABS tablet Take 90 mg by mouth 2 (two) times daily.   Unknown at Unknown  . tiZANidine (ZANAFLEX) 4 MG tablet Take 1 tablet (4 mg total) by mouth every 8 (eight) hours as needed for muscle spasms. 90 tablet 5 prn at prn  . ACCU-CHEK SOFTCLIX LANCETS lancets USE AS DIRECTED 100 each 0 Taking  . aspirin EC 81 MG tablet Take 81 mg by mouth daily.   Not Taking at Unknown time  . [place] alk-PHENObarbital (DONNATAL) 16.2 MG tablet Take 1 tablet by mouth as needed (pain).   Not Taking at Unknown time  . cetirizine (ZYRTEC) 10 MG tablet Take 10 mg by mouth at bedtime.    Not Taking at Unknown time  . colchicine 0.6 MG tablet Take 0.6 mg by mouth daily as needed (gout).    Not Taking at Unknown time  . diphenhydrAMINE (BENADRYL) 25 mg capsule Take 50 mg by mouth at bedtime.    Not Taking at Unknown time  . EPINEPHrine (EPIPEN 2-PAK) 0.3 mg/0.3 mL IJ SOAJ injection Inject 0.3 mg into the muscle once.   prn at prn  . glucose blood (ACCU-CHEK ACTIVE STRIPS) test strip Use as instructed 100 each 12 Taking  . glucose blood (ACCU-CHEK AVIVA PLUS) test strip Check FSBS three to four times per day 100 each 1 Taking  . indomethacin (INDOCIN) 25 MG capsule Take 25 mg by mouth 2 (two) times daily as needed for mild pain.   Not Taking at Unknown time  . Insulin Pen Needle (B-D UF III MINI PEN NEEDLES) 31G X 5 MM MISC Once a day with insulin 50 each 3 Taking  . Melatonin 5 MG TABS Take 5 mg by mouth at bedtime.    Taking  . oxyCODONE-acetaminophen (PERCOCET) 7.5-325 MG tablet Take 1 tablet by mouth 3 (three) times daily as needed for severe pain. (Patient not taking: Reported on 11/13/2018) 90 tablet 0 Not Taking at Unknown time  . pregabalin (LYRICA) 50 MG capsule Take 1 capsule (50 mg total) by mouth 3 (three) times daily. 90 capsule 2  Taking  . ranitidine (ZANTAC) 150 MG capsule Take 150 mg by mouth at bedtime.    Not Taking at Unknown time   Social History   Socioeconomic History  . Marital status: Married    Spouse name: Not on file  . Number of children: Not on file  . Years of education: Not on file  . Highest education level: Not on file  Occupational History  . Not on file  Social Needs  . Financial resource strain: Not on file  . Food insecurity:    Worry: Not on file    Inability: Not on file  . Transportation needs:    Medical: Not on file  Non-medical: Not on file  Tobacco Use  . Smoking status: Former Smoker    Packs/day: 1.00    Years: 10.00    Pack years: 10.00    Types: Cigarettes    Last attempt to quit: 04/15/2015    Years since quitting: 3.9  . Smokeless tobacco: Never Used  Substance and Sexual Activity  . Alcohol use: No    Alcohol/week: 0.0 standard drinks  . Drug use: No  . Sexual activity: Yes  Lifestyle  . Physical activity:    Days per week: Not on file    Minutes per session: Not on file  . Stress: Not on file  Relationships  . Social connections:    Talks on phone: Not on file    Gets together: Not on file    Attends religious service: Not on file    Active member of club or organization: Not on file    Attends meetings of clubs or organizations: Not on file    Relationship status: Not on file  . Intimate partner violence:    Fear of current or ex partner: Not on file    Emotionally abused: Not on file    Physically abused: Not on file    Forced sexual activity: Not on file  Other Topics Concern  . Not on file  Social History Narrative  . Not on file    Family History  Problem Relation Age of Onset  . Breast cancer Mother   . Skin cancer Mother   . Arrhythmia Mother        A-fib  . Cancer Mother        breast, colon?, skin  . Hypertension Father   . Diabetes Father   . Cancer Maternal Grandfather   . Heart disease Brother        stent  . Allergies  Son   . Alzheimer's disease Maternal Grandmother   . Stroke Neg Hx   . COPD Neg Hx       Review of systems complete and found to be negative unless listed above      PHYSICAL EXAM  General: Well developed, well nourished, in no acute distress HEENT:  Normocephalic and atramatic Neck:  No JVD.  Lungs: Clear bilaterally to auscultation and percussion. Heart: HRRR . Normal S1 and S2 without gallops or murmurs.  Abdomen: Bowel sounds are positive, abdomen soft and non-tender  Msk:  Back normal, normal gait. Normal strength and tone for age. Extremities: No clubbing, cyanosis or edema.   Neuro: Alert and oriented X 3. Psych:  Good affect, responds appropriately  Labs:   Lab Results  Component Value Date   WBC 12.3 (H) 03/24/2019   HGB 16.6 03/24/2019   HCT 50.2 03/24/2019   MCV 81.5 03/24/2019   PLT 180 03/24/2019    Recent Labs  Lab 03/24/19 0256  NA 140  K 4.5  CL 104  CO2 20*  BUN 14  CREATININE 0.91  CALCIUM 9.3  PROT 7.6  BILITOT 2.7*  ALKPHOS 84  ALT 46*  AST 47*  GLUCOSE 154*   Lab Results  Component Value Date   TROPONINI 0.03 (HH) 03/24/2019    Lab Results  Component Value Date   CHOL 110 02/14/2019   CHOL 132 11/13/2018   CHOL 147 08/30/2018   Lab Results  Component Value Date   HDL 32 (L) 02/14/2019   HDL 38 (L) 11/13/2018   HDL 26 (L) 08/30/2018   Lab Results  Component Value Date  LDLCALC 59 02/14/2019   LDLCALC 78 11/13/2018   LDLCALC 98 08/30/2018   Lab Results  Component Value Date   TRIG 108 02/14/2019   TRIG 84 11/13/2018   TRIG 133 08/30/2018   Lab Results  Component Value Date   CHOLHDL 3.4 02/14/2019   CHOLHDL 3.5 11/13/2018   CHOLHDL 5.7 (H) 08/30/2018   No results found for: LDLDIRECT    Radiology: Dg Chest 1 View  Result Date: 03/24/2019 CLINICAL DATA:  Initial evaluation for acute chest pain. EXAM: CHEST  1 VIEW COMPARISON:  Prior radiograph from 08/22/2018 FINDINGS: Transverse heart size within normal  limits. Mediastinal silhouette normal. Defibrillator pads overlie the left chest. Lungs normally inflated. No focal infiltrates. No pulmonary edema or pleural effusion. No pneumothorax. No acute osseous finding.  Cervical ACDF noted. IMPRESSION: No active cardiopulmonary disease. Electronically Signed   By: Jeannine Boga M.D.   On: 03/24/2019 03:17   Ct Angio Chest Pe W And/or Wo Contrast  Result Date: 03/24/2019 CLINICAL DATA:  Chest pain, vomiting EXAM: CT ANGIOGRAPHY CHEST WITH CONTRAST TECHNIQUE: Multidetector CT imaging of the chest was performed using the standard protocol during bolus administration of intravenous contrast. Multiplanar CT image reconstructions and MIPs were obtained to evaluate the vascular anatomy. CONTRAST:  1m OMNIPAQUE IOHEXOL 350 MG/ML SOLN COMPARISON:  Chest radiograph dated 03/24/2019. CT chest dated 08/22/2018. FINDINGS: Cardiovascular: Satisfactory opacification of the bilateral pulmonary arteries to the subsegmental level. No evidence of pulmonary embolism. Although not tailored for evaluation of the thoracic aorta, there is no evidence of thoracic aortic aneurysm or dissection. Atherosclerotic calcifications of the aortic arch. The heart is normal in size.  No pericardial effusion. Coronary atherosclerosis the LAD and left circumflex. Mediastinum/Nodes: No suspicious mediastinal lymphadenopathy. Visualized thyroid is unremarkable. Lungs/Pleura: Lungs are essentially clear. 3 mm subpleural nodule in the superior segment left lower lobe (series 7/image 45), stable versus mildly decreased, benign. Mild dependent atelectasis in the bilateral lower lobes. No focal consolidation. No pleural effusion or pneumothorax. Upper Abdomen: Visualized upper abdomen is grossly unremarkable. Musculoskeletal: Cervical spine fixation hardware, incompletely visualized. Review of the MIP images confirms the above findings. IMPRESSION: No evidence pulmonary embolism. No evidence of acute  cardiopulmonary disease. Aortic Atherosclerosis (ICD10-I70.0). Electronically Signed   By: SJulian HyM.D.   On: 03/24/2019 05:01    EKG: Sinus tachycardia  ASSESSMENT AND PLAN:   1.  Intermittent, sharp chest pain, with atypical features, very borderline troponin, 0.03, 0.03, nondiagnostic ECG 2.  Known CAD, status post inferior STEMI and DES to mid left circumflex 08/23/2018  Recommendations  1.  Agree with current therapy 2.  Defer full dose anticoagulation at this time 3.  Lexiscan Myoview in a.m.  Signed: AIsaias CowmanMD,PhD, [facility]3/29/2020, 9:38 AM

## 2019-03-24 NOTE — ED Notes (Signed)
Dr. Cinda Quest notified of continued HTN. No new orders received.

## 2019-03-24 NOTE — ED Notes (Signed)
hospitalist in to see pt.

## 2019-03-24 NOTE — H&P (Signed)
[facility] NAME: John Hartman    MR#:  [id number]  DATE OF BIRTH:  [birth year]  DATE OF ADMISSION:  03/24/2019  PRIMARY CARE PHYSICIAN: Arnetha Courser, MD   REQUESTING/REFERRING PHYSICIAN: Emergency room physician  CHIEF COMPLAINT:   Chief Complaint  Patient presents with   Chest Pain    HISTORY OF PRESENT ILLNESS:  John Hartman  is a 48 y.o. male with a known history of CAD, and STEMI status post PCI August 2019, hypertension, diabetes, gout, IBS, dyslipidemia presents to the emergency room with complaint of chest pain and nausea vomiting.  Patient reported onset of nausea vomiting around bedtime and after several episodes vomiting patient started having left-sided severe sharp stabbing chest pain along with numbness around his mouth and pain radiating to left arm.  Patient felt like his previous MI symptoms.  Patient denied fever or chills or shortness of breath or dizziness or diaphoresis.  When EMS arrived he was found to be hypotensive with heart rate of 150.  Patient denies leg swelling or pain or recent sick contact or travel.  On arrival to emergency room he was tachycardic and hypertensive.  EKG shows sinus tachycardia with nonspecific ST-T changes.  Troponin is 0.03.  White count is 12,000.  D-dimer was elevated.  Patient had a CT angios chest which showed no evidence of PE.  Patient continues to be tachycardic however currently chest pain-free.  Patient received IV metoprolol in the emergency room.  Patient was referred to hospitalist service for possible ACS.  PAST MEDICAL HISTORY:   Past Medical History:  Diagnosis Date   Angioedema    Bronchitis    DDD (degenerative disc disease), cervical    Diabetes mellitus ([place])    Fatty liver 03/31/2017   [place] April 2018   GERD (gastroesophageal reflux disease)    Gout    Heart attack ([place])    HTN (hypertension)    Hypertension    CONTROLLED ON MEDS   IBS (irritable  bowel syndrome)    LVH (left ventricular hypertrophy) 11/24/2018   Moderate, ECHO   Myocardial infarction ([place]) 08/23/2018   Seasonal allergies    Spinal stenosis     PAST SURGICAL HISTORY:   Past Surgical History:  Procedure Laterality Date   ACDF with fusion  2007   CARDIAC CATHETERIZATION Left 08/15/2016   Procedure: Left Heart Cath and Coronary Angiography;  Surgeon: Wellington Hampshire, MD;  Location: [facility];  Service: Cardiovascular;  Laterality: Left;   COLONOSCOPY WITH PROPOFOL N/A 06/26/2015   Procedure: COLONOSCOPY WITH PROPOFOL;  Surgeon: Lucilla Lame, MD;  Location: [facility];  Service: Endoscopy;  Laterality: N/A;  with biopsies   CORONARY/GRAFT ACUTE MI REVASCULARIZATION N/A 08/23/2018   Procedure: Coronary/Graft Acute MI Revascularization;  Surgeon: Isaias Cowman, MD;  Location: [facility];  Service: Cardiovascular;  Laterality: N/A;   LEFT HEART CATH AND CORONARY ANGIOGRAPHY N/A 08/23/2018   Procedure: LEFT HEART CATH AND CORONARY ANGIOGRAPHY;  Surgeon: Isaias Cowman, MD;  Location: [facility];  Service: Cardiovascular;  Laterality: N/A;   NASAL SEPTUM SURGERY  2004   SHOULDER SURGERY Left 2007   Testicular torsion  1980s   VASECTOMY      SOCIAL HISTORY:   Social History   Tobacco Use   Smoking status: Former Smoker    Packs/day: 1.00    Years: 10.00    Pack years: 10.00    Types: Cigarettes  Last attempt to quit: 04/15/2015    Years since quitting: 3.9   Smokeless tobacco: Never Used  Substance Use Topics   Alcohol use: No    Alcohol/week: 0.0 standard drinks    FAMILY HISTORY:   Family History  Problem Relation Age of Onset   Breast cancer Mother    Skin cancer Mother    Arrhythmia Mother        A-fib   Cancer Mother        breast, colon?, skin   Hypertension Father    Diabetes Father    Cancer Maternal Grandfather    Heart disease Brother        stent   Allergies  Son    Alzheimer's disease Maternal Grandmother    Stroke Neg Hx    COPD Neg Hx     DRUG ALLERGIES:   Allergies  Allergen Reactions   Lisinopril Swelling   Ace Inhibitors Swelling    REVIEW OF SYSTEMS:   ROS Constitutional: No fever/chills Eyes: No visual changes. ENT: No sore throat. Cardiovascular:  chest pain. Respiratory: Some shortness of breath. Gastrointestinal: No abdominal pain.  No nausea, no vomiting.  No diarrhea.  No constipation. Genitourinary: Negative for dysuria. Musculoskeletal: Negative for back pain. Skin: Negative for rash. Neurological: Negative for headaches, focal weakness   MEDICATIONS AT HOME:   Prior to Admission medications   Medication Sig Start Date End Date Taking? Authorizing Provider  allopurinol (ZYLOPRIM) 100 MG tablet Take 100 mg by mouth daily.    Yes [provider]  atorvastatin (LIPITOR) 80 MG tablet Take 1 tablet (80 mg total) by mouth daily at 6 PM. 08/25/18 03/24/19 Yes Pyreddy, Reatha Harps, MD  BELBUCA [place] 2 (TWO) TIMES DAILY. 01/29/19  Yes [provider]  Dapagliflozin-metFORMIN HCl ER (XIGDUO XR) 04-999 MG TB24 Take 2 tablets by mouth daily.    Yes [provider]  ezetimibe (ZETIA) 10 MG tablet Take 1 tablet (10 mg total) by mouth daily. 12/03/18  Yes Lada, Satira Anis, MD  hydrALAZINE (APRESOLINE) 25 MG tablet Take 1 tablet (25 mg total) by mouth 2 (two) times daily. 08/25/18 03/24/19 Yes Pyreddy, Reatha Harps, MD  hydrochlorothiazide (MICROZIDE) 12.5 MG capsule Take 1 capsule by mouth daily. 03/06/19  Yes [provider]  Insulin Degludec (TRESIBA FLEXTOUCH) 100 UNIT/ML SOPN Inject 34 Units into the skin at bedtime.  07/12/16  Yes Lada, Satira Anis, MD  metoprolol tartrate (LOPRESSOR) 50 MG tablet Take 1 tablet (50 mg total) by mouth 2 (two) times daily. Patient taking differently: Take 25 mg by mouth 2 (two) times daily.  08/25/18 03/24/19 Yes Pyreddy, Reatha Harps, MD  nitroGLYCERIN  (NITROSTAT) 0.4 MG SL tablet Place 1 tablet (0.4 mg total) under the tongue every 5 (five) minutes as needed for chest pain. 08/25/18  Yes Pyreddy, Reatha Harps, MD  ticagrelor (BRILINTA) 90 MG TABS tablet Take 90 mg by mouth 2 (two) times daily.   Yes [provider]  tiZANidine (ZANAFLEX) 4 MG tablet Take 1 tablet (4 mg total) by mouth every 8 (eight) hours as needed for muscle spasms. 09/18/18  Yes Gillis Santa, MD  ACCU-CHEK SOFTCLIX LANCETS lancets USE AS DIRECTED 07/23/16   Arnetha Courser, MD  aspirin EC 81 MG tablet Take 81 mg by mouth daily.    [provider]  [place] alk-PHENObarbital (DONNATAL) 16.2 MG tablet Take 1 tablet by mouth as needed (pain).    [provider]  cetirizine (ZYRTEC) 10 MG tablet  Take 10 mg by mouth at bedtime.     [provider]  colchicine 0.6 MG tablet Take 0.6 mg by mouth daily as needed (gout).     [provider]  diphenhydrAMINE (BENADRYL) 25 mg capsule Take 50 mg by mouth at bedtime.     [provider]  EPINEPHrine (EPIPEN 2-PAK) 0.3 mg/0.3 mL IJ SOAJ injection Inject 0.3 mg into the muscle once.    [provider]  glucose blood (ACCU-CHEK ACTIVE STRIPS) test strip Use as instructed 07/09/16   Arnetha Courser, MD  glucose blood (ACCU-CHEK AVIVA PLUS) test strip Check FSBS three to four times per day 07/23/16   Arnetha Courser, MD  indomethacin (INDOCIN) 25 MG capsule Take 25 mg by mouth 2 (two) times daily as needed for mild pain.    [provider]  Insulin Pen Needle (B-D UF III MINI PEN NEEDLES) 31G X 5 MM MISC Once a day with insulin 07/09/16   Lada, Satira Anis, MD  Melatonin 5 MG TABS Take 5 mg by mouth at bedtime.     [provider]  oxyCODONE-acetaminophen (PERCOCET) 7.5-325 MG tablet Take 1 tablet by mouth 3 (three) times daily as needed for severe pain. Patient not taking: Reported on 11/13/2018 08/09/18   Gillis Santa, MD  pregabalin (LYRICA) 50 MG capsule Take 1 capsule (50  mg total) by mouth 3 (three) times daily. 09/18/18   Gillis Santa, MD  ranitidine (ZANTAC) 150 MG capsule Take 150 mg by mouth at bedtime.     [provider]      VITAL SIGNS:  Blood pressure (!) 162/117, pulse (!) 121, temperature 98.6 F (37 C), temperature source Oral, resp. rate 13, height 6' (1.829 m), weight 90.7 kg, SpO2 97 %.  PHYSICAL EXAMINATION:  Physical Exam  GENERAL:  48 y.o.-year-old patient lying in the bed with no acute distress.  EYES: Pupils equal, round, reactive to light and accommodation. No scleral icterus. Extraocular muscles intact.  HEENT: Head atraumatic, normocephalic. Oropharynx and nasopharynx clear.  NECK:  Supple, no jugular venous distention. No thyroid enlargement, no tenderness.  LUNGS: Normal breath sounds bilaterally, no wheezing, rales,rhonchi or crepitation. No use of accessory muscles of respiration.  CARDIOVASCULAR: S1, S2 normal. No murmurs, rubs, or gallops.  ABDOMEN: Soft, nontender, nondistended. Bowel sounds present. No organomegaly or mass.  EXTREMITIES: No pedal edema, cyanosis, or clubbing.  NEUROLOGIC: Cranial nerves II through XII are intact. Muscle strength 5/5 in all extremities. Sensation intact. Gait not checked.  PSYCHIATRIC: The patient is alert and oriented x 3.  SKIN: No obvious rash, lesion, or ulcer.   LABORATORY PANEL:   CBC Recent Labs  Lab 03/24/19 0256  WBC 12.3*  HGB 16.6  HCT 50.2  PLT 180   ------------------------------------------------------------------------------------------------------------------  Chemistries  Recent Labs  Lab 03/24/19 0256  NA 140  K 4.5  CL 104  CO2 20*  GLUCOSE 154*  BUN 14  CREATININE 0.91  CALCIUM 9.3  AST 47*  ALT 46*  ALKPHOS 84  BILITOT 2.7*   ------------------------------------------------------------------------------------------------------------------  Cardiac Enzymes Recent Labs  Lab 03/24/19 0500  TROPONINI 0.03*    ------------------------------------------------------------------------------------------------------------------  RADIOLOGY:  Dg Chest 1 View  Result Date: 03/24/2019 CLINICAL DATA:  Initial evaluation for acute chest pain. EXAM: CHEST  1 VIEW COMPARISON:  Prior radiograph from 08/22/2018 FINDINGS: Transverse heart size within normal limits. Mediastinal silhouette normal. Defibrillator pads overlie the left chest. Lungs normally inflated. No focal infiltrates. No pulmonary edema or pleural effusion. No  pneumothorax. No acute osseous finding.  Cervical ACDF noted. IMPRESSION: No active cardiopulmonary disease. Electronically Signed   By: Jeannine Boga M.D.   On: 03/24/2019 03:17   Ct Angio Chest Pe W And/or Wo Contrast  Result Date: 03/24/2019 CLINICAL DATA:  Chest pain, vomiting EXAM: CT ANGIOGRAPHY CHEST WITH CONTRAST TECHNIQUE: Multidetector CT imaging of the chest was performed using the standard protocol during bolus administration of intravenous contrast. Multiplanar CT image reconstructions and MIPs were obtained to evaluate the vascular anatomy. CONTRAST:  110m OMNIPAQUE IOHEXOL 350 MG/ML SOLN COMPARISON:  Chest radiograph dated 03/24/2019. CT chest dated 08/22/2018. FINDINGS: Cardiovascular: Satisfactory opacification of the bilateral pulmonary arteries to the subsegmental level. No evidence of pulmonary embolism. Although not tailored for evaluation of the thoracic aorta, there is no evidence of thoracic aortic aneurysm or dissection. Atherosclerotic calcifications of the aortic arch. The heart is normal in size.  No pericardial effusion. Coronary atherosclerosis the LAD and left circumflex. Mediastinum/Nodes: No suspicious mediastinal lymphadenopathy. Visualized thyroid is unremarkable. Lungs/Pleura: Lungs are essentially clear. 3 mm subpleural nodule in the superior segment left lower lobe (series 7/image 45), stable versus mildly decreased, benign. Mild dependent atelectasis in the  bilateral lower lobes. No focal consolidation. No pleural effusion or pneumothorax. Upper Abdomen: Visualized upper abdomen is grossly unremarkable. Musculoskeletal: Cervical spine fixation hardware, incompletely visualized. Review of the MIP images confirms the above findings. IMPRESSION: No evidence pulmonary embolism. No evidence of acute cardiopulmonary disease. Aortic Atherosclerosis (ICD10-I70.0). Electronically Signed   By: SJulian HyM.D.   On: 03/24/2019 05:01      IMPRESSION AND PLAN:   This is a 48year old male with history of CAD and STEMI status post PCI, hypertension, diabetes, gout presented with chest pain.  1.  Chest pain: High suspicion of ACS.  Patient will be started on full dose Lovenox and will admit to telemetry under observation status.  Continue cycle cardiac enzymes.  Patient is currently pain-free.  Continue beta-blocker aspirin and Brilinta.  Consult cardiology service.  Consult service has been informed.  2.  Malignant hypertension: Continue current home medications and as needed beta-blocker if needed.  3.  Diabetes: Insulin sliding scale with Accu-Chek.  4.  Diabetic diet  Patient on full dose Lovenox.    All the records are reviewed and case discussed with ED provider. Management plans discussed with the patient, family and they are in agreement.  CODE STATUS: Full  TOTAL TIME TAKING CARE OF THIS PATIENT: 45 minutes.    HMarygrace DroughtM.D on 03/24/2019 at 6:59 AM  Between 7am to 6pm - Pager - [phone]  After 6pm go to [email or web address] - [email or web address] [facility]  Office  [phone] CC: Primary care physician; LArnetha Courser MD   Note: This dictation was prepared with Dragon dictation along with smaller phrase technology. Any transcriptional errors that result from this process are unintentional.

## 2019-03-24 NOTE — ED Notes (Signed)
Report to alicia, rn.  

## 2019-03-24 NOTE — ED Notes (Signed)
Critical troponin of 0.03 called from lab. Dr. Cinda Quest notified.

## 2019-03-24 NOTE — ED Notes (Signed)
Pt on defib pads.

## 2019-03-24 NOTE — ED Notes (Addendum)
ED TO INPATIENT HANDOFF REPORT  ED Nurse Name and Phone #:  Elmo Putt #[phone]  S Name/Age/Gender John Hartman 48 y.o. male Room/Bed: ED12A/ED12A  Code Status   Code Status: Prior  Home/SNF/Other Home Patient oriented to: self, place, time and situation Is this baseline? Yes   Triage Complete: Triage complete  Chief Complaint [facility] Chest Pain  Triage Note Pt with onset of emesis tonight at 2130. Pt states developed chest pain approx one hour ago. Ems gave pt zofran 4mg  iv, fentanyl 164mcg iv, 542mL ns iv. Pt took NTG 0.4at home and 324mg  asa. Pt appears in significant distress. md at bedside.    Allergies Allergies  Allergen Reactions  . Lisinopril Swelling  . Ace Inhibitors Swelling    Level of Care/Admitting Diagnosis ED Disposition    ED Disposition Condition [facility] Area: [place] [[id number]]  Level of Care: Telemetry [5]  Diagnosis: Chest pain [458099]  Admitting Physician: Marygrace Drought [[id number]]  Attending Physician: Marygrace Drought [[id number]]  PT Class (Do Not Modify): Observation [104]  PT Acc Code (Do Not Modify): Observation [10022]       B Medical/Surgery History Past Medical History:  Diagnosis Date  . Angioedema   . Bronchitis   . DDD (degenerative disc disease), cervical   . Diabetes mellitus ([place])   . Fatty liver 03/31/2017   [place] April 2018  . GERD (gastroesophageal reflux disease)   . Gout   . Heart attack ([place])   . HTN (hypertension)   . Hypertension    CONTROLLED ON MEDS  . IBS (irritable bowel syndrome)   . LVH (left ventricular hypertrophy) 11/24/2018   Moderate, ECHO  . Myocardial infarction ([place]) 08/23/2018  . Seasonal allergies   . Spinal stenosis    Past Surgical History:  Procedure Laterality Date  . ACDF with fusion  2007  . CARDIAC CATHETERIZATION Left 08/15/2016   Procedure: Left Heart Cath and Coronary Angiography;  Surgeon: Wellington Hampshire, MD;  Location: [place] [facility];   Service: Cardiovascular;  Laterality: Left;  . COLONOSCOPY WITH PROPOFOL N/A 06/26/2015   Procedure: COLONOSCOPY WITH PROPOFOL;  Surgeon: Lucilla Lame, MD;  Location: [facility];  Service: Endoscopy;  Laterality: N/A;  with biopsies  . CORONARY/GRAFT ACUTE MI REVASCULARIZATION N/A 08/23/2018   Procedure: Coronary/Graft Acute MI Revascularization;  Surgeon: Isaias Cowman, MD;  Location: [facility];  Service: Cardiovascular;  Laterality: N/A;  . LEFT HEART CATH AND CORONARY ANGIOGRAPHY N/A 08/23/2018   Procedure: LEFT HEART CATH AND CORONARY ANGIOGRAPHY;  Surgeon: Isaias Cowman, MD;  Location: [facility];  Service: Cardiovascular;  Laterality: N/A;  . NASAL SEPTUM SURGERY  2004  . SHOULDER SURGERY Left 2007  . Testicular torsion  1980s  . VASECTOMY       A IV Location/Drains/Wounds Patient Lines/Drains/Airways Status   Active Line/Drains/Airways    Name:   Placement date:   Placement time:   Site:   Days:   Peripheral IV 03/24/19 Left Antecubital   03/24/19    -    Antecubital   less than 1   Peripheral IV 03/24/19 Left Forearm   03/24/19    0300    Forearm   less than 1   Incision (Closed) 06/26/15 Rectum Other (Comment)   06/26/15    1403     1367          Intake/Output Last 24 hours No intake or output data in the 24 hours ending  03/24/19 0709  Labs/Imaging Results for orders placed or performed during the hospital encounter of 03/24/19 (from the past 48 hour(s))  CBC with Differential     Status: Abnormal   Collection Time: 03/24/19  2:56 AM  Result Value Ref Range   WBC 12.3 (H) 4.0 - 10.5 K/uL   RBC 6.16 (H) 4.22 - 5.81 MIL/uL   Hemoglobin 16.6 13.0 - 17.0 g/dL   HCT 50.2 39.0 - 52.0 %   MCV 81.5 80.0 - 100.0 fL   MCH 26.9 26.0 - 34.0 pg   MCHC 33.1 30.0 - 36.0 g/dL   RDW 14.1 11.5 - 15.5 %   Platelets 180 150 - 400 K/uL   nRBC 0.0 0.0 - 0.2 %   Neutrophils Relative % 87 %   Neutro Abs 10.7 (H) 1.7 - 7.7 K/uL   Lymphocytes  Relative 5 %   Lymphs Abs 0.6 (L) 0.7 - 4.0 K/uL   Monocytes Relative 7 %   Monocytes Absolute 0.8 0.1 - 1.0 K/uL   Eosinophils Relative 1 %   Eosinophils Absolute 0.1 0.0 - 0.5 K/uL   Basophils Relative 0 %   Basophils Absolute 0.0 0.0 - 0.1 K/uL   Immature Granulocytes 0 %   Abs Immature Granulocytes 0.05 0.00 - 0.07 K/uL    Comment: Performed at [facility], [address] [place], [place] [address]  Comprehensive metabolic panel     Status: Abnormal   Collection Time: 03/24/19  2:56 AM  Result Value Ref Range   Sodium 140 135 - 145 mmol/L   Potassium 4.5 3.5 - 5.1 mmol/L   Chloride 104 98 - 111 mmol/L   CO2 20 (L) 22 - 32 mmol/L   Glucose, Bld 154 (H) 70 - 99 mg/dL   BUN 14 6 - 20 mg/dL   Creatinine, Ser 0.91 0.61 - 1.24 mg/dL   Calcium 9.3 8.9 - 10.3 mg/dL   Total Protein 7.6 6.5 - 8.1 g/dL   Albumin 4.4 3.5 - 5.0 g/dL   AST 47 (H) 15 - 41 U/L   ALT 46 (H) 0 - 44 U/L   Alkaline Phosphatase 84 38 - 126 U/L   Total Bilirubin 2.7 (H) 0.3 - 1.2 mg/dL   GFR calc non Af Amer >60 >60 mL/min   GFR calc Af Amer >60 >60 mL/min   Anion gap 16 (H) 5 - 15    Comment: Performed at [facility], [address] [place], [place] [address]  Troponin I - ONCE - STAT     Status: Abnormal   Collection Time: 03/24/19  2:56 AM  Result Value Ref Range   Troponin I 0.03 (HH) <0.03 ng/mL    Comment: CRITICAL RESULT CALLED TO, READ BACK BY AND VERIFIED WITH APRIL BUMGARD AT 5681 03/24/2019.  TFK Performed at [facility], [place],  [address]   Fibrin derivatives D-Dimer [facility] only)     Status: Abnormal   Collection Time: 03/24/19  2:56 AM  Result Value Ref Range   Fibrin derivatives D-dimer ([facility]) 1,143.44 (H) 0.00 - 499.00 ng/mL (FEU)    Comment: (NOTE) <> Exclusion of Venous Thromboembolism (VTE) - OUTPATIENT ONLY   (Emergency Department or [place])   0-499 ng/ml (FEU): With a low to intermediate pretest probability                       for VTE this test result excludes the diagnosis  of VTE.   >499 ng/ml (FEU) : VTE not excluded; additional work up for VTE is                      required. <> Testing on Inpatients and Evaluation of Disseminated Intravascular   Coagulation (DIC) Reference Range:   0-499 ng/ml (FEU) Performed at [facility], [address] [place], [place] [address]   TSH     Status: None   Collection Time: 03/24/19  2:56 AM  Result Value Ref Range   TSH 1.487 0.350 - 4.500 uIU/mL    Comment: Performed by a 3rd Generation assay with a functional sensitivity of <=0.01 uIU/mL. Performed at [facility], [address] [place], [place] [address]   Troponin I - Once-Timed     Status: Abnormal   Collection Time: 03/24/19  5:00 AM  Result Value Ref Range   Troponin I 0.03 (HH) <0.03 ng/mL    Comment: CRITICAL VALUE NOTED. VALUE IS CONSISTENT WITH PREVIOUSLY REPORTED/CALLED VALUE.  TFK Performed at [facility], [address] [place], [place] [address]    Dg Chest 1 View  Result Date: 03/24/2019 CLINICAL DATA:  Initial evaluation for acute chest pain. EXAM: CHEST  1 VIEW COMPARISON:  Prior radiograph from 08/22/2018 FINDINGS: Transverse heart size within normal limits. Mediastinal silhouette normal. Defibrillator pads overlie the left chest. Lungs normally inflated. No focal infiltrates. No pulmonary edema or pleural effusion. No pneumothorax. No acute osseous finding.  Cervical ACDF noted. IMPRESSION: No active cardiopulmonary disease. Electronically Signed   By: Jeannine Boga M.D.   On: 03/24/2019 03:17   Ct Angio Chest Pe W And/or Wo Contrast  Result Date: 03/24/2019 CLINICAL DATA:  Chest pain, vomiting EXAM: CT ANGIOGRAPHY CHEST WITH CONTRAST TECHNIQUE: Multidetector CT imaging of the chest was performed using the standard protocol during bolus administration of intravenous contrast. Multiplanar CT image reconstructions and MIPs were obtained  to evaluate the vascular anatomy. CONTRAST:  71mL OMNIPAQUE IOHEXOL 350 MG/ML SOLN COMPARISON:  Chest radiograph dated 03/24/2019. CT chest dated 08/22/2018. FINDINGS: Cardiovascular: Satisfactory opacification of the bilateral pulmonary arteries to the subsegmental level. No evidence of pulmonary embolism. Although not tailored for evaluation of the thoracic aorta, there is no evidence of thoracic aortic aneurysm or dissection. Atherosclerotic calcifications of the aortic arch. The heart is normal in size.  No pericardial effusion. Coronary atherosclerosis the LAD and left circumflex. Mediastinum/Nodes: No suspicious mediastinal lymphadenopathy. Visualized thyroid is unremarkable. Lungs/Pleura: Lungs are essentially clear. 3 mm subpleural nodule in the superior segment left lower lobe (series 7/image 45), stable versus mildly decreased, benign. Mild dependent atelectasis in the bilateral lower lobes. No focal consolidation. No pleural effusion or pneumothorax. Upper Abdomen: Visualized upper abdomen is grossly unremarkable. Musculoskeletal: Cervical spine fixation hardware, incompletely visualized. Review of the MIP images confirms the above findings. IMPRESSION: No evidence pulmonary embolism. No evidence of acute cardiopulmonary disease. Aortic Atherosclerosis (ICD10-I70.0). Electronically Signed   By: Julian Hy M.D.   On: 03/24/2019 05:01    Pending Labs Unresulted Labs (From admission, onward)    Start     Ordered   03/24/19 0621  Urine Drug Screen, Qualitative ([place] only)  Once,   STAT     03/24/19 0621   Signed and Held  Troponin I - Now Then Q3H  Now then every 3 hours,   TIMED     Signed and Held   Signed and Held  Creatinine, serum  (enoxaparin (LOVENOX) full dose)  Weekly,   R    Comments:  while on enoxaparin therapy.    Signed and Held          Vitals/Pain Today's Vitals   03/24/19 0630 03/24/19 0648 03/24/19 0700 03/24/19 0708  BP:  (!) 162/117 (!) 153/122   Pulse: (!)  126 (!) 121 (!) 123   Resp:   18   Temp:      TempSrc:      SpO2: 97% 97% 96%   Weight:      Height:      PainSc:    4     Isolation Precautions No active isolations  Medications Medications  promethazine (PHENERGAN) injection 12.5 mg (has no administration in time range)  morphine 4 MG/ML injection 4 mg (4 mg Intravenous Given 03/24/19 0300)  metoprolol tartrate (LOPRESSOR) injection 5 mg (5 mg Intravenous Given 03/24/19 0259)  nitroGLYCERIN (NITROGLYN) 2 % ointment 1 inch (1 inch Topical Given 03/24/19 0300)  metoprolol tartrate (LOPRESSOR) injection 5 mg (5 mg Intravenous Given 03/24/19 0309)  LORazepam (ATIVAN) injection 1 mg (1 mg Intravenous Given 03/24/19 0312)  metoprolol tartrate (LOPRESSOR) injection 5 mg (5 mg Intravenous Given 03/24/19 0401)  iohexol (OMNIPAQUE) 350 MG/ML injection 75 mL (75 mLs Intravenous Contrast Given 03/24/19 0445)  ondansetron (ZOFRAN) injection 4 mg (4 mg Intravenous Given 03/24/19 0651)    Mobility walks Moderate fall risk   Focused Assessments Cardiac Assessment Handoff:  Cardiac Rhythm: Sinus tachycardia Lab Results  Component Value Date   TROPONINI 0.03 ([place]) 03/24/2019   No results found for: DDIMER Does the Patient currently have chest pain? No Pulmonary Assessment Handoff:  Lung sounds:  clear bil O2 Device: Room Air        R Recommendations: See Admitting Provider Note  Report given to: Manuela Schwartz, RN  Additional Notes:

## 2019-03-24 NOTE — ED Triage Notes (Addendum)
Pt with onset of emesis tonight at 2130. Pt states developed chest pain approx one hour ago. Ems gave pt zofran 4mg  iv, fentanyl 125mcg iv, 559mL ns iv. Pt took NTG 0.4at home and 324mg  asa. Pt appears in significant distress. md at bedside.

## 2019-03-24 NOTE — ED Notes (Signed)
Patient transported to CT 

## 2019-03-24 NOTE — ED Notes (Signed)
Pt updated on plan of care. Pt's spouse telephoned by pt and updated via telephone with pt's consent.

## 2019-03-24 NOTE — ED Notes (Signed)
Pt states has numbness in face.

## 2019-03-24 NOTE — ED Notes (Signed)
Pt placed on oxygen at 2lpm for pox of 90% on ra after ativan. Pt with rebound pox fo 95% on oxygen. Pt reports improved pain. Pt is less anxious.

## 2019-03-24 NOTE — ED Notes (Signed)
Pt provided with ice chips per request. After ice chips, pt requesting nausea medication. Pt advised to consume ice chips slowly if he is nauseated and this rn will check for nausea order on admission orders.

## 2019-03-24 NOTE — Progress Notes (Signed)
Take this morning for chest pain with nausea, vomiting, has underlying history of CAD, PCI, troponins are negative, seen by cardiology, patient will get full dose anticoagulation, Lexiscan Myoview in a.m., agree with admitting physician, medications.

## 2019-03-24 NOTE — ED Notes (Signed)
April RN, aware of bed assigned   

## 2019-03-25 ENCOUNTER — Observation Stay: Payer: No Typology Code available for payment source

## 2019-03-25 LAB — NM MYOCAR MULTI W/SPECT W/WALL MOTION / EF
CHL CUP NUCLEAR SSS: 6
CSEPED: 1 min
CSEPEW: 1 METS
Exercise duration (sec): 0 s
LV dias vol: 61 mL (ref 62–150)
LV sys vol: 25 mL
MPHR: 175 {beats}/min
Peak HR: 157 {beats}/min
Percent HR: 89 %
Rest HR: 115 {beats}/min
SDS: 0
SRS: 7
TID: 1.02

## 2019-03-25 LAB — LIPASE, BLOOD: LIPASE: 21 U/L (ref 11–51)

## 2019-03-25 LAB — GLUCOSE, CAPILLARY
Glucose-Capillary: 106 mg/dL — ABNORMAL HIGH (ref 70–99)
Glucose-Capillary: 128 mg/dL — ABNORMAL HIGH (ref 70–99)

## 2019-03-25 MED ORDER — METOPROLOL TARTRATE 50 MG PO TABS: 25.0000 mg | ORAL_TABLET | Freq: Two times a day (BID) | ORAL | 0 refills | Status: DC

## 2019-03-25 MED ORDER — NITROGLYCERIN 0.4 MG SL SUBL
0.4000 mg | SUBLINGUAL_TABLET | SUBLINGUAL | Status: DC | PRN
  Administered 2019-03-25: 0.4 mg via SUBLINGUAL

## 2019-03-25 MED ORDER — TECHNETIUM TC 99M TETROFOSMIN IV KIT
31.5900 | PACK | Freq: Once | INTRAVENOUS | Status: AC | PRN
  Administered 2019-03-25: 31.59 via INTRAVENOUS

## 2019-03-25 MED ORDER — ONDANSETRON 4 MG PO TBDP: 4.0000 mg | ORAL_TABLET | Freq: Three times a day (TID) | ORAL | 0 refills | Status: DC | PRN

## 2019-03-25 MED ORDER — REGADENOSON 0.4 MG/5ML IV SOLN
0.4000 mg | Freq: Once | INTRAVENOUS | Status: AC
  Administered 2019-03-25: 0.4 mg via INTRAVENOUS

## 2019-03-25 MED ORDER — TECHNETIUM TC 99M TETROFOSMIN IV KIT
10.0200 | PACK | Freq: Once | INTRAVENOUS | Status: AC | PRN
  Administered 2019-03-25: 10.02 via INTRAVENOUS

## 2019-03-25 MED ORDER — AMINOPHYLLINE 25 MG/ML IV SOLN
75.0000 mg | Freq: Once | INTRAVENOUS | Status: AC
  Administered 2019-03-25: 75 mg via INTRAVENOUS
  Filled 2019-03-25: qty 3

## 2019-03-25 MED ORDER — METOPROLOL TARTRATE 50 MG PO TABS
50.0000 mg | ORAL_TABLET | Freq: Two times a day (BID) | ORAL | Status: DC
  Administered 2019-03-25: 50 mg via ORAL
  Filled 2019-03-25: qty 1

## 2019-03-25 NOTE — Progress Notes (Addendum)
[facility] Card[facility]  SUBJECTIVE: The patient reports persistent chest discomfort since being in the hospital. He describes it as sharp, typically comes and goes. He reports nausea and vomiting with some abdominal discomfort prior to hospitalization, as well as last night. This morning during [place], patient reported persistent chest discomfort.   Vitals:   03/24/19 1650 03/24/19 1954 03/25/19 0601 03/25/19 0728  BP: (!) 167/114 (!) 136/91 (!) 172/116 (!) 156/117  Pulse: (!) 109 (!) 110 93 (!) 114  Resp: 18 18 16    Temp: 98.7 F (37.1 C) 98.2 F (36.8 C) 98.2 F (36.8 C) 99.1 F (37.3 C)  TempSrc: Oral Oral Oral Oral  SpO2: 97% 95% 98% 97%  Weight:   90.5 kg   Height:         Intake/Output Summary (Last 24 hours) at 03/25/2019 1029 Last data filed at 03/25/2019 0601 Gross per 24 hour  Intake 1189.77 ml  Output 1750 ml  Net -560.23 ml      PHYSICAL EXAM  General: Well developed, well nourished, currently lying in bed in obvious discomfort HEENT:  Normocephalic and atramatic Neck:  No JVD.  Lungs: Clear bilaterally to auscultation and percussion. Heart: tachycardic, regular rhythm . Normal S1 and S2 without gallops or murmurs.  Abdomen: nondistended Msk:  Back normal, gait not assessed. Normal strength and tone for age. Extremities: No clubbing, cyanosis or edema.   Neuro: Alert and oriented X 3. Psych:  Good affect, responds appropriately   LABS: Basic Metabolic Panel: Recent Labs    03/24/19 0256  NA 140  K 4.5  CL 104  CO2 20*  GLUCOSE 154*  BUN 14  CREATININE 0.91  CALCIUM 9.3   Liver Function Tests: Recent Labs    03/24/19 0256  AST 47*  ALT 46*  ALKPHOS 84  BILITOT 2.7*  PROT 7.6  ALBUMIN 4.4   No results for input(s): LIPASE, AMYLASE in the last 72 hours. CBC: Recent Labs    03/24/19 0256  WBC 12.3*  NEUTROABS 10.7*  HGB 16.6  HCT 50.2  MCV 81.5  PLT 180   Cardiac Enzymes: Recent Labs    03/24/19 0955 03/24/19 1232 03/24/19 1553   TROPONINI 0.03* 0.03* 0.03*   BNP: Invalid input(s): POCBNP D-Dimer: No results for input(s): DDIMER in the last 72 hours. Hemoglobin A1C: No results for input(s): HGBA1C in the last 72 hours. Fasting Lipid Panel: No results for input(s): CHOL, HDL, LDLCALC, TRIG, CHOLHDL, LDLDIRECT in the last 72 hours. Thyroid Function Tests: Recent Labs    03/24/19 0256  TSH 1.487   Anemia Panel: No results for input(s): VITAMINB12, FOLATE, FERRITIN, TIBC, IRON, RETICCTPCT in the last 72 hours.  Dg Chest 1 View  Result Date: 03/24/2019 CLINICAL DATA:  Initial evaluation for acute chest pain. EXAM: CHEST  1 VIEW COMPARISON:  Prior radiograph from 08/22/2018 FINDINGS: Transverse heart size within normal limits. Mediastinal silhouette normal. Defibrillator pads overlie the left chest. Lungs normally inflated. No focal infiltrates. No pulmonary edema or pleural effusion. No pneumothorax. No acute osseous finding.  Cervical ACDF noted. IMPRESSION: No active cardiopulmonary disease. Electronically Signed   By: Jeannine Boga M.D.   On: 03/24/2019 03:17   Ct Angio Chest Pe W And/or Wo Contrast  Result Date: 03/24/2019 CLINICAL DATA:  Chest pain, vomiting EXAM: CT ANGIOGRAPHY CHEST WITH CONTRAST TECHNIQUE: Multidetector CT imaging of the chest was performed using the standard protocol during bolus administration of intravenous contrast. Multiplanar CT image reconstructions and MIPs were obtained to evaluate the vascular  anatomy. CONTRAST:  61mL OMNIPAQUE IOHEXOL 350 MG/ML SOLN COMPARISON:  Chest radiograph dated 03/24/2019. CT chest dated 08/22/2018. FINDINGS: Cardiovascular: Satisfactory opacification of the bilateral pulmonary arteries to the subsegmental level. No evidence of pulmonary embolism. Although not tailored for evaluation of the thoracic aorta, there is no evidence of thoracic aortic aneurysm or dissection. Atherosclerotic calcifications of the aortic arch. The heart is normal in size.  No  pericardial effusion. Coronary atherosclerosis the LAD and left circumflex. Mediastinum/Nodes: No suspicious mediastinal lymphadenopathy. Visualized thyroid is unremarkable. Lungs/Pleura: Lungs are essentially clear. 3 mm subpleural nodule in the superior segment left lower lobe (series 7/image 45), stable versus mildly decreased, benign. Mild dependent atelectasis in the bilateral lower lobes. No focal consolidation. No pleural effusion or pneumothorax. Upper Abdomen: Visualized upper abdomen is grossly unremarkable. Musculoskeletal: Cervical spine fixation hardware, incompletely visualized. Review of the MIP images confirms the above findings. IMPRESSION: No evidence pulmonary embolism. No evidence of acute cardiopulmonary disease. Aortic Atherosclerosis (ICD10-I70.0). Electronically Signed   By: Julian Hy M.D.   On: 03/24/2019 05:01     TELEMETRY: sinus tachycardia  ASSESSMENT AND PLAN:  Active Problems:   Chest pain   Tachycardia    1. Chest pain, intermittent and sharp, with nondiagnostic ECG, with known coronary artery disease, status post inferior STEMI and DES to mid left Cx 07/2018. Troponin borderline elevated and flat at 0.03 x 4 2. Hypertension, elevated this morning 3. Nausea and vomiting prior to admission, with episode last night 4. Elevated AST/ALT, and total bilirubin  Recommendations: 1. Agree with current therapy 2. Sublingual nitroglycerin PRN for chest pain 3. Increase metoprolol tartrate to 50 mg BID 4. Continue aspirin and Brilinta uninterrupted for one year 5. Review Lexiscan Myoview 6. Pending results of Myoview, recommend GI evaluation with elevated transaminases, bilirubin and nausea, vomiting   Clabe Seal, PA-C 03/25/2019 10:29 AM

## 2019-03-25 NOTE — Progress Notes (Signed)
Patient ID: John Hartman, male   DOB: [birth year], 48 y.o.   MRN: [id number] Pt c/o nausea. No vomiting since last night. No fever. No abdominal pain. Cp ?atypical.  Has stress earlier and after injection of dye -- felt"bad'. No vomtiing. tolerating fruits and liquids. Mild elevated LFT's ?viral illness given stomach upset with vomtiing. Lipase 21 USG abd --hepatic steatosis (known) D/w dr Ovid Curd results--no further cardiac testing needed. F/u out pt CT chest neg PE Gave option to stay overnite vs go home with FLD and prn zofran. Pt opted the later

## 2019-03-25 NOTE — Discharge Summary (Signed)
[facility] NAME: John Hartman    MR#:  [id number]  DATE OF BIRTH:  [birth year]  DATE OF ADMISSION:  03/24/2019 ADMITTING PHYSICIAN: No admitting provider for patient encounter.  DATE OF DISCHARGE: 03/25/2019  PRIMARY CARE PHYSICIAN: Arnetha Courser, MD    ADMISSION DIAGNOSIS:  Tachycardia [R00.0] Chest pain, unspecified type [R07.9] Hypertension, unspecified type [I10]  DISCHARGE DIAGNOSIS:  Nausea vomiting-?viral illness Chest pain --NM study low risk  SECONDARY DIAGNOSIS:   Past Medical History:  Diagnosis Date  . Angioedema   . Bronchitis   . DDD (degenerative disc disease), cervical   . Diabetes mellitus ([place])   . Fatty liver 03/31/2017   [place] April 2018  . GERD (gastroesophageal reflux disease)   . Gout   . Heart attack ([place])   . HTN (hypertension)   . Hypertension    CONTROLLED ON MEDS  . IBS (irritable bowel syndrome)   . LVH (left ventricular hypertrophy) 11/24/2018   Moderate, ECHO  . Myocardial infarction ([place]) 08/23/2018  . Seasonal allergies   . Spinal stenosis     HOSPITAL COURSE:  John Hartman  is a 48 y.o. male with a known history of CAD, and STEMI status post PCI August 2019, hypertension, diabetes, gout, IBS, dyslipidemia presents to the emergency room with complaint of chest pain and nausea vomiting.    1.  Chest pain: High suspicion of ACS given recent NSTEMI WITH PCI and stent 6 months ago -pt ruled out of acute MI -Seen by Dr Ovid Curd study did not show any evidence of new MI. Low risk study, recommends to cont cardiac meds - Patient is currently pain-free.  Continue beta-blocker aspirin and Brilinta.   -cont statins  2.  Malignant hypertension: Continue current home medications and as needed beta-blocker if needed.  3.  Diabetes: Insulin sliding scale with Accu-Chek.  4. Nausea, vomiting, mild elevated LFTS--s/o ?viral illness -able to keep some fruits and po fluids -US  abdomen shows hepatic steatosis( known) -prn zofran -lipase negative  D/w pt at length. He is ok to go home for now. Discussed easy with diet and see how he feels.  D/c home  CONSULTS OBTAINED:  Treatment Team:  Isaias Cowman, MD  DRUG ALLERGIES:   Allergies  Allergen Reactions  . Lisinopril Swelling  . Ace Inhibitors Swelling    DISCHARGE MEDICATIONS:   Allergies as of 03/25/2019      Reactions   Lisinopril Swelling   Ace Inhibitors Swelling      Medication List    STOP taking these medications   aspirin EC 81 MG tablet   [place] alk-PHENObarbital 16.2 MG tablet Commonly known as:  DONNATAL   cetirizine 10 MG tablet Commonly known as:  ZYRTEC   colchicine 0.6 MG tablet   diphenhydrAMINE 25 mg capsule Commonly known as:  BENADRYL   EpiPen 2-Pak 0.3 mg/0.3 mL Soaj injection Generic drug:  EPINEPHrine   indomethacin 25 MG capsule Commonly known as:  INDOCIN   oxyCODONE-acetaminophen 7.5-325 MG tablet Commonly known as:  Percocet   pregabalin 50 MG capsule Commonly known as:  Lyrica     TAKE these medications   Accu-Chek Softclix Lancets lancets USE AS DIRECTED   allopurinol 100 MG tablet Commonly known as:  ZYLOPRIM Take 100 mg by mouth daily.   atorvastatin 80 MG tablet Commonly known as:  LIPITOR Take 1 tablet (80 mg total) by mouth daily at 6 PM.   Belbuca [place]  Generic drug:  Buprenorphine HCl PLACE 450 MCG INSIDE CHEEK 2 (TWO) TIMES DAILY.   Brilinta 90 MG Tabs tablet Generic drug:  ticagrelor Take 90 mg by mouth 2 (two) times daily.   ezetimibe 10 MG tablet Commonly known as:  ZETIA Take 1 tablet (10 mg total) by mouth daily.   glucose blood test strip Commonly known as:  ACCU-CHEK ACTIVE STRIPS Use as instructed   glucose blood test strip Commonly known as:  Accu-Chek Aviva Plus Check FSBS three to four times per day   hydrALAZINE 25 MG tablet Commonly known as:  APRESOLINE Take 1 tablet (25 mg total) by  mouth 2 (two) times daily.   hydrochlorothiazide 12.5 MG capsule Commonly known as:  MICROZIDE Take 1 capsule by mouth daily.   Insulin Pen Needle 31G X 5 MM Misc Commonly known as:  B-D UF III MINI PEN NEEDLES Once a day with insulin   Melatonin 5 MG Tabs Take 5 mg by mouth at bedtime.   metoprolol tartrate 50 MG tablet Commonly known as:  LOPRESSOR Take 0.5 tablets (25 mg total) by mouth 2 (two) times daily for 30 days.   nitroGLYCERIN 0.4 MG SL tablet Commonly known as:  NITROSTAT Place 1 tablet (0.4 mg total) under the tongue every 5 (five) minutes as needed for chest pain.   ondansetron 4 MG disintegrating tablet Commonly known as:  Zofran ODT Take 1 tablet (4 mg total) by mouth every 8 (eight) hours as needed for nausea or vomiting.   ranitidine 150 MG capsule Commonly known as:  ZANTAC Take 150 mg by mouth at bedtime.   tiZANidine 4 MG tablet Commonly known as:  ZANAFLEX Take 1 tablet (4 mg total) by mouth every 8 (eight) hours as needed for muscle spasms.   Tyler Aas FlexTouch 100 UNIT/ML Sopn FlexTouch Pen Generic drug:  insulin degludec Inject 34 Units into the skin at bedtime.   Xigduo XR 04-999 MG Tb24 Generic drug:  Dapagliflozin-metFORMIN HCl ER Take 2 tablets by mouth daily.       If you experience worsening of your admission symptoms, develop shortness of breath, life threatening emergency, suicidal or homicidal thoughts you must seek medical attention immediately by calling 911 or calling your MD immediately  if symptoms less severe.  You Must read complete instructions/literature along with all the possible adverse reactions/side effects for all the Medicines you take and that have been prescribed to you. Take any new Medicines after you have completely understood and accept all the possible adverse reactions/side effects.   Please note  You were cared for by a hospitalist during your hospital stay. If you have any questions about your discharge  medications or the care you received while you were in the hospital after you are discharged, you can call the unit and asked to speak with the hospitalist on call if the hospitalist that took care of you is not available. Once you are discharged, your primary care physician will handle any further medical issues. Please note that NO REFILLS for any discharge medications will be authorized once you are discharged, as it is imperative that you return to your primary care physician (or establish a relationship with a primary care physician if you do not have one) for your aftercare needs so that they can reassess your need for medications and monitor your lab values. Today   SUBJECTIVE   nausea  VITAL SIGNS:  Blood pressure (!) 156/117, pulse (!) 114, temperature 99.1 F (37.3 C), temperature source Oral,  resp. rate 16, height 6' (1.829 m), weight 90.5 kg, SpO2 97 %.  I/O:    Intake/Output Summary (Last 24 hours) at 03/25/2019 1538 Last data filed at 03/25/2019 0601 Gross per 24 hour  Intake 1189.77 ml  Output 1750 ml  Net -560.23 ml    PHYSICAL EXAMINATION:  GENERAL:  48 y.o.-year-old patient lying in the bed with no acute distress.  EYES: Pupils equal, round, reactive to light and accommodation. No scleral icterus. Extraocular muscles intact.  HEENT: Head atraumatic, normocephalic. Oropharynx and nasopharynx clear.  NECK:  Supple, no jugular venous distention. No thyroid enlargement, no tenderness.  LUNGS: Normal breath sounds bilaterally, no wheezing, rales,rhonchi or crepitation. No use of accessory muscles of respiration.  CARDIOVASCULAR: S1, S2 normal. No murmurs, rubs, or gallops.  ABDOMEN: Soft, non-tender, non-distended. Bowel sounds present. No organomegaly or mass.  EXTREMITIES: No pedal edema, cyanosis, or clubbing.  NEUROLOGIC: Cranial nerves II through XII are intact. Muscle strength 5/5 in all extremities. Sensation intact. Gait not checked.  PSYCHIATRIC: The patient is  alert and oriented x 3.  SKIN: No obvious rash, lesion, or ulcer.   DATA REVIEW:   CBC  Recent Labs  Lab 03/24/19 0256  WBC 12.3*  HGB 16.6  HCT 50.2  PLT 180    Chemistries  Recent Labs  Lab 03/24/19 0256  NA 140  K 4.5  CL 104  CO2 20*  GLUCOSE 154*  BUN 14  CREATININE 0.91  CALCIUM 9.3  AST 47*  ALT 46*  ALKPHOS 84  BILITOT 2.7*    Microbiology Results   No results found for this or any previous visit (from the past 240 hour(s)).  RADIOLOGY:  Dg Chest 1 View  Result Date: 03/24/2019 CLINICAL DATA:  Initial evaluation for acute chest pain. EXAM: CHEST  1 VIEW COMPARISON:  Prior radiograph from 08/22/2018 FINDINGS: Transverse heart size within normal limits. Mediastinal silhouette normal. Defibrillator pads overlie the left chest. Lungs normally inflated. No focal infiltrates. No pulmonary edema or pleural effusion. No pneumothorax. No acute osseous finding.  Cervical ACDF noted. IMPRESSION: No active cardiopulmonary disease. Electronically Signed   By: Jeannine Boga M.D.   On: 03/24/2019 03:17   Ct Angio Chest Pe W And/or Wo Contrast  Result Date: 03/24/2019 CLINICAL DATA:  Chest pain, vomiting EXAM: CT ANGIOGRAPHY CHEST WITH CONTRAST TECHNIQUE: Multidetector CT imaging of the chest was performed using the standard protocol during bolus administration of intravenous contrast. Multiplanar CT image reconstructions and MIPs were obtained to evaluate the vascular anatomy. CONTRAST:  48m OMNIPAQUE IOHEXOL 350 MG/ML SOLN COMPARISON:  Chest radiograph dated 03/24/2019. CT chest dated 08/22/2018. FINDINGS: Cardiovascular: Satisfactory opacification of the bilateral pulmonary arteries to the subsegmental level. No evidence of pulmonary embolism. Although not tailored for evaluation of the thoracic aorta, there is no evidence of thoracic aortic aneurysm or dissection. Atherosclerotic calcifications of the aortic arch. The heart is normal in size.  No pericardial effusion.  Coronary atherosclerosis the LAD and left circumflex. Mediastinum/Nodes: No suspicious mediastinal lymphadenopathy. Visualized thyroid is unremarkable. Lungs/Pleura: Lungs are essentially clear. 3 mm subpleural nodule in the superior segment left lower lobe (series 7/image 45), stable versus mildly decreased, benign. Mild dependent atelectasis in the bilateral lower lobes. No focal consolidation. No pleural effusion or pneumothorax. Upper Abdomen: Visualized upper abdomen is grossly unremarkable. Musculoskeletal: Cervical spine fixation hardware, incompletely visualized. Review of the MIP images confirms the above findings. IMPRESSION: No evidence pulmonary embolism. No evidence of acute cardiopulmonary disease. Aortic Atherosclerosis (ICD10-I70.0). Electronically Signed  By: Julian Hy M.D.   On: 03/24/2019 05:01   Nm Myocar Multi W/spect W/wall Motion / Ef  Result Date: 03/25/2019  Blood pressure demonstrated a normal response to exercise.  There was no ST segment deviation noted during stress.  Defect 1: There is a medium defect of moderate severity present in the basal inferolateral and mid inferolateral location.  Findings consistent with prior myocardial infarction with peri-infarct ischemia.  This is a low risk study.  The left ventricular ejection fraction is normal (55-65%).    US Abdomen Limited Ruq  Result Date: 03/25/2019 CLINICAL DATA:  Elevated liver enzymes. EXAM: ULTRASOUND ABDOMEN LIMITED RIGHT UPPER QUADRANT COMPARISON:  Right upper quadrant ultrasound dated March 31, 2017. FINDINGS: Gallbladder: No gallstones or wall thickening visualized. No sonographic Murphy sign noted by sonographer. Common bile duct: Diameter: 3 mm, normal. Liver: No focal lesion identified. Unchanged diffusely increased parenchymal echogenicity with focal fatty sparing along the gallbladder fossa. Portal vein is patent on color Doppler imaging with normal direction of blood flow towards the liver.  IMPRESSION: 1. No acute abnormality. 2. Unchanged hepatic steatosis. Electronically Signed   By: Titus Dubin M.D.   On: 03/25/2019 13:53     CODE STATUS:     Code Status Orders  (From admission, onward)         Start     Ordered   03/24/19 0943  Full code  Continuous     03/24/19 0942        Code Status History    Date Active Date Inactive Code Status Order ID Comments User Context   08/23/2018 0725 08/25/2018 1604 Full Code [id number]  Isaias Cowman, MD Inpatient   08/23/2018 0550 08/23/2018 0725 Full Code [id number]  Amelia Jo, MD Inpatient   08/27/2016 2244 08/28/2016 1722 Full Code [id number]  Hugelmeyer, Ubaldo Glassing, DO Inpatient      TOTAL TIME TAKING CARE OF THIS PATIENT: *40* minutes.    Fritzi Mandes M.D on 03/25/2019 at 3:38 PM  Between 7am to 6pm - Pager - [phone] After 6pm go to [email or web address] - password [email or web address] [facility]  Office  [phone]  CC: Primary care physician; Arnetha Courser, MD

## 2019-03-25 NOTE — Progress Notes (Signed)
Pt to be discharged to home later this pm. Iv's and tele removed. discharage instructions and prescript given. Awaiting ride.

## 2019-03-26 ENCOUNTER — Emergency Department
Admission: EM | Admit: 2019-03-26 | Discharge: 2019-03-26 | Discharge status: Against Medical Advice | Payer: No Typology Code available for payment source | Attending: Emergency Medicine | Admitting: Emergency Medicine

## 2019-03-26 ENCOUNTER — Encounter: Payer: Self-pay | Admitting: Emergency Medicine

## 2019-03-26 ENCOUNTER — Emergency Department: Payer: No Typology Code available for payment source

## 2019-03-26 ENCOUNTER — Other Ambulatory Visit: Payer: Self-pay

## 2019-03-26 DIAGNOSIS — E860 Dehydration: Secondary | ICD-10-CM | POA: Insufficient documentation

## 2019-03-26 DIAGNOSIS — Z794 Long term (current) use of insulin: Secondary | ICD-10-CM | POA: Diagnosis not present

## 2019-03-26 DIAGNOSIS — R1013 Epigastric pain: Secondary | ICD-10-CM | POA: Diagnosis not present

## 2019-03-26 DIAGNOSIS — R112 Nausea with vomiting, unspecified: Secondary | ICD-10-CM

## 2019-03-26 DIAGNOSIS — Z87891 Personal history of nicotine dependence: Secondary | ICD-10-CM | POA: Diagnosis not present

## 2019-03-26 DIAGNOSIS — E119 Type 2 diabetes mellitus without complications: Secondary | ICD-10-CM | POA: Diagnosis not present

## 2019-03-26 DIAGNOSIS — Z79899 Other long term (current) drug therapy: Secondary | ICD-10-CM | POA: Insufficient documentation

## 2019-03-26 LAB — COMPREHENSIVE METABOLIC PANEL
ALT: 34 U/L (ref 0–44)
AST: 31 U/L (ref 15–41)
Albumin: 4.9 g/dL (ref 3.5–5.0)
Alkaline Phosphatase: 92 U/L (ref 38–126)
Anion gap: 18 — ABNORMAL HIGH (ref 5–15)
BUN: 16 mg/dL (ref 6–20)
CO2: 19 mmol/L — ABNORMAL LOW (ref 22–32)
Calcium: 9.7 mg/dL (ref 8.9–10.3)
Chloride: 101 mmol/L (ref 98–111)
Creatinine, Ser: 0.82 mg/dL (ref 0.61–1.24)
GFR calc Af Amer: >60 mL/min (ref >60)
GFR calc non Af Amer: >60 mL/min (ref >60)
Glucose, Bld: 141 mg/dL — ABNORMAL HIGH (ref 70–99)
Potassium: 3.2 mmol/L — ABNORMAL LOW (ref 3.5–5.1)
SODIUM: 138 mmol/L (ref 135–145)
Total Bilirubin: 3.2 mg/dL — ABNORMAL HIGH (ref 0.3–1.2)
Total Protein: 8.6 g/dL — ABNORMAL HIGH (ref 6.5–8.1)

## 2019-03-26 LAB — CBC WITH DIFFERENTIAL/PLATELET
Abs Immature Granulocytes: 0.03 10*3/uL (ref 0.00–0.07)
Basophils Absolute: 0.1 10*3/uL (ref 0.0–0.1)
Basophils Relative: 1 %
Eosinophils Absolute: 0.3 10*3/uL (ref 0.0–0.5)
Eosinophils Relative: 3 %
HCT: 53.5 % — ABNORMAL HIGH (ref 39.0–52.0)
Hemoglobin: 18 g/dL — ABNORMAL HIGH (ref 13.0–17.0)
Immature Granulocytes: 0 %
Lymphocytes Relative: 20 %
Lymphs Abs: 2 10*3/uL (ref 0.7–4.0)
MCH: 26.7 pg (ref 26.0–34.0)
MCHC: 33.6 g/dL (ref 30.0–36.0)
MCV: 79.3 fL — ABNORMAL LOW (ref 80.0–100.0)
MONO ABS: 0.9 10*3/uL (ref 0.1–1.0)
Monocytes Relative: 9 %
Neutro Abs: 6.4 10*3/uL (ref 1.7–7.7)
Neutrophils Relative %: 67 %
Platelets: 234 10*3/uL (ref 150–400)
RBC: 6.75 MIL/uL — ABNORMAL HIGH (ref 4.22–5.81)
RDW: 14.9 % (ref 11.5–15.5)
WBC: 9.7 10*3/uL (ref 4.0–10.5)
nRBC: 0 % (ref 0.0–0.2)

## 2019-03-26 LAB — TROPONIN I: Troponin I: <0.03 ng/mL (ref <0.03)

## 2019-03-26 LAB — LIPASE, BLOOD: Lipase: 37 U/L (ref 11–51)

## 2019-03-26 MED ORDER — SUCRALFATE 1 GM/10ML PO SUSP: 1.0000 g | Freq: Four times a day (QID) | ORAL | 1 refills | Status: DC

## 2019-03-26 MED ORDER — PANTOPRAZOLE SODIUM 40 MG IV SOLR: 40.0000 mg | Freq: Two times a day (BID) | INTRAVENOUS | Status: DC

## 2019-03-26 MED ORDER — SODIUM CHLORIDE 0.9 % IV SOLN
80.0000 mg | Freq: Once | INTRAVENOUS | Status: AC
  Administered 2019-03-26: 80 mg via INTRAVENOUS
  Filled 2019-03-26: qty 80

## 2019-03-26 MED ORDER — ALUM & MAG HYDROXIDE-SIMETH 200-200-20 MG/5ML PO SUSP
30.0000 mL | Freq: Once | ORAL | Status: AC
  Administered 2019-03-26: 30 mL via ORAL
  Filled 2019-03-26: qty 30

## 2019-03-26 MED ORDER — SODIUM CHLORIDE 0.9 % IV SOLN
8.0000 mg/h | INTRAVENOUS | Status: DC
  Administered 2019-03-26: 8 mg/h via INTRAVENOUS
  Filled 2019-03-26: qty 80

## 2019-03-26 MED ORDER — HALOPERIDOL LACTATE 5 MG/ML IJ SOLN
5.0000 mg | Freq: Once | INTRAMUSCULAR | Status: AC
  Administered 2019-03-26: 5 mg via INTRAVENOUS
  Filled 2019-03-26: qty 1

## 2019-03-26 MED ORDER — LIDOCAINE VISCOUS HCL 2 % MT SOLN
15.0000 mL | Freq: Once | OROMUCOSAL | Status: AC
  Administered 2019-03-26: 15 mL via ORAL
  Filled 2019-03-26: qty 15

## 2019-03-26 MED ORDER — PANTOPRAZOLE SODIUM 40 MG PO TBEC: 40.0000 mg | DELAYED_RELEASE_TABLET | Freq: Every day | ORAL | 0 refills | Status: DC

## 2019-03-26 MED ORDER — IOHEXOL 300 MG/ML  SOLN
100.0000 mL | Freq: Once | INTRAMUSCULAR | Status: AC | PRN
  Administered 2019-03-26: 100 mL via INTRAVENOUS

## 2019-03-26 MED ORDER — SODIUM CHLORIDE 0.9 % IV SOLN
Freq: Once | INTRAVENOUS | Status: AC
  Administered 2019-03-26: 22:00:00 via INTRAVENOUS

## 2019-03-26 MED ORDER — SODIUM CHLORIDE 0.9 % IV BOLUS
1000.0000 mL | Freq: Once | INTRAVENOUS | Status: AC
  Administered 2019-03-26: 1000 mL via INTRAVENOUS

## 2019-03-26 NOTE — ED Provider Notes (Signed)
[facility] Emergency Department Provider Note   ____________________________________________   I have reviewed the triage vital signs and the nursing notes.   HISTORY  Chief Complaint Emesis   History limited by: Not Limited   HPI John Hartman is a 48 y.o. male who presents to the emergency department today because of concern for continued nausea and vomiting. The patient states that the symptoms started 3 days ago. Did come to the emergency department and was admitted for ACS evaluation. He did have a stent placed roughly 6 months ago. Did rule out for ACS during his admission. After going home he has continued to have nausea and vomiting. The patient has had associated abdominal pain. He denies any fevers.    Records reviewed. Per medical record review patient has a history of admission to the hospital with discharge yesterday.   Past Medical History:  Diagnosis Date  . Angioedema   . Bronchitis   . DDD (degenerative disc disease), cervical   . Diabetes mellitus ([place])   . Fatty liver 03/31/2017   [place] April 2018  . GERD (gastroesophageal reflux disease)   . Gout   . Heart attack ([place])   . HTN (hypertension)   . Hypertension    CONTROLLED ON MEDS  . IBS (irritable bowel syndrome)   . LVH (left ventricular hypertrophy) 11/24/2018   Moderate, ECHO  . Myocardial infarction ([place]) 08/23/2018  . Seasonal allergies   . Spinal stenosis     Patient Active Problem List   Diagnosis Date Noted  . Tachycardia 03/24/2019  . Status post myocardial infarction 11/24/2018  . LVH (left ventricular hypertrophy) 11/24/2018  . 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 NSTEMI (non-ST elevated myocardial infarction) ([place]) 08/22/2018  . Elevated hemoglobin ([place]) 08/11/2017  . Chronic lower back pain 08/04/2017  . Degenerative disc disease, lumbar 08/04/2017  . 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 Calcification of abdominal aorta (H[facility]) 08/04/2017  . Fatty liver  03/31/2017  . Elevated liver enzymes 02/11/2017  . Serum total bilirubin elevated 02/11/2017  . Hyperlipidemia LDL goal <70 01/04/2017  . Elevated serum glutamic pyruvic transaminase (SGPT) level 01/04/2017  . Angioedema   . Encounter for medication monitoring 08/25/2016  . Coronary artery disease 08/25/2016  . Uncontrolled diabetes mellitus with hyperglycemia ([place])   . Chest pain 08/08/2016  . Preventative health care 08/02/2016  . Ketonuria 07/12/2016  . Glucosuria 07/12/2016  . 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 Palpitations 08/12/2015  . Family history of malignant neoplasm of gastrointestinal tract   . Benign neoplasm of rectosigmoid junction   . Essential hypertension 05/28/2015  . Gout 05/28/2015  . IBS (irritable bowel syndrome) 05/28/2015    Past Surgical History:  Procedure Laterality Date  . ACDF with fusion  2007  . CARDIAC CATHETERIZATION Left 08/15/2016   Procedure: Left Heart Cath and Coronary Angiography;  Surgeon: Wellington Hampshire, MD;  Location: [facility];  Service: Cardiovascular;  Laterality: Left;  . COLONOSCOPY WITH PROPOFOL N/A 06/26/2015   Procedure: COLONOSCOPY WITH PROPOFOL;  Surgeon: Lucilla Lame, MD;  Location: [facility];  Service: Endoscopy;  Laterality: N/A;  with biopsies  . CORONARY/GRAFT ACUTE MI REVASCULARIZATION N/A 08/23/2018   Procedure: Coronary/Graft Acute MI Revascularization;  Surgeon: Isaias Cowman, MD;  Location: [facility];  Service: Cardiovascular;  Laterality: N/A;  . LEFT HEART CATH AND CORONARY ANGIOGRAPHY N/A 08/23/2018   Procedure: LEFT HEART CATH AND CORONARY ANGIOGRAPHY;  Surgeon: Isaias Cowman, MD;  Location: [facility];  Service: Cardiovascular;  Laterality: N/A;  . NASAL SEPTUM SURGERY  2004  . SHOULDER SURGERY Left 2007  . Testicular torsion  1980s  . VASECTOMY      Prior to Admission medications   Medication Sig Start Date End Date Taking? Authorizing Provider  ACCU-CHEK  SOFTCLIX LANCETS lancets USE AS DIRECTED 07/23/16   Arnetha Courser, MD  allopurinol (ZYLOPRIM) 100 MG tablet Take 100 mg by mouth daily.     [provider]  atorvastatin (LIPITOR) 80 MG tablet Take 1 tablet (80 mg total) by mouth daily at 6 PM. 08/25/18 03/24/19  Pyreddy, Reatha Harps, MD  BELBUCA 450 MCG FILM PLACE [place] 2 (TWO) TIMES DAILY. 01/29/19   [provider]  Dapagliflozin-metFORMIN HCl ER (XIGDUO XR) 04-999 MG TB24 Take 2 tablets by mouth daily.     [provider]  ezetimibe (ZETIA) 10 MG tablet Take 1 tablet (10 mg total) by mouth daily. 12/03/18   Arnetha Courser, MD  glucose blood (ACCU-CHEK ACTIVE STRIPS) test strip Use as instructed 07/09/16   Arnetha Courser, MD  glucose blood (ACCU-CHEK AVIVA PLUS) test strip Check FSBS three to four times per day 07/23/16   Arnetha Courser, MD  hydrALAZINE (APRESOLINE) 25 MG tablet Take 1 tablet (25 mg total) by mouth 2 (two) times daily. 08/25/18 03/24/19  Saundra Shelling, MD  hydrochlorothiazide (MICROZIDE) 12.5 MG capsule Take 1 capsule by mouth daily. 03/06/19   [provider]  Insulin Degludec (TRESIBA FLEXTOUCH) 100 UNIT/ML SOPN Inject 34 Units into the skin at bedtime.  07/12/16   Lada, Satira Anis, MD  Insulin Pen Needle (B-D UF III MINI PEN NEEDLES) 31G X 5 MM MISC Once a day with insulin 07/09/16   Lada, Satira Anis, MD  Melatonin 5 MG TABS Take 5 mg by mouth at bedtime.     [provider]  metoprolol tartrate (LOPRESSOR) 50 MG tablet Take 0.5 tablets (25 mg total) by mouth 2 (two) times daily for 30 days. 03/25/19 04/24/19  Fritzi Mandes, MD  nitroGLYCERIN (NITROSTAT) 0.4 MG SL tablet Place 1 tablet (0.4 mg total) under the tongue every 5 (five) minutes as needed for chest pain. 08/25/18   Saundra Shelling, MD  ondansetron (ZOFRAN ODT) 4 MG disintegrating tablet Take 1 tablet (4 mg total) by mouth every 8 (eight) hours as needed for nausea or vomiting. 03/25/19   Fritzi Mandes, MD  ranitidine (ZANTAC) 150  MG capsule Take 150 mg by mouth at bedtime.     [provider]  ticagrelor (BRILINTA) 90 MG TABS tablet Take 90 mg by mouth 2 (two) times daily.    [provider]  tiZANidine (ZANAFLEX) 4 MG tablet Take 1 tablet (4 mg total) by mouth every 8 (eight) hours as needed for muscle spasms. 09/18/18   Gillis Santa, MD    Allergies Lisinopril and Ace inhibitors  Family History  Problem Relation Age of Onset  . Breast cancer Mother   . Skin cancer Mother   . Arrhythmia Mother        A-fib  . Cancer Mother        breast, colon?, skin  . Hypertension Father   . Diabetes Father   . Cancer Maternal Grandfather   . Heart disease Brother        stent  . Allergies Son   . Alzheimer's disease Maternal Grandmother   . Stroke Neg Hx   . COPD Neg Hx     Social History Social  History   Tobacco Use  . Smoking status: Former Smoker    Packs/day: 1.00    Years: 10.00    Pack years: 10.00    Types: Cigarettes    Last attempt to quit: 04/15/2015    Years since quitting: 3.9  . Smokeless tobacco: Never Used  Substance Use Topics  . Alcohol use: No    Alcohol/week: 0.0 standard drinks  . Drug use: No    Review of Systems Constitutional: No fever/chills Eyes: No visual changes. ENT: No sore throat. Cardiovascular: Denies chest pain. Respiratory: Denies shortness of breath. Gastrointestinal: Positive for abdominal pain, nausea and vomiting.  Genitourinary: Negative for dysuria. Musculoskeletal: Negative for back pain. Skin: Negative for rash. Neurological: Negative for headaches, focal weakness or numbness.  ____________________________________________   PHYSICAL EXAM:  VITAL SIGNS: ED Triage Vitals  Enc Vitals Group     BP 03/26/19 2008 (!) 157/109     Pulse Rate 03/26/19 2008 (!) 135     Resp 03/26/19 2008 (!) 24     Temp 03/26/19 2008 98.2 F (36.8 C)     Temp Source 03/26/19 2008 Oral     SpO2 --      Weight 03/26/19 2017 199 lb 8.3 oz (90.5 kg)      Height --      Head Circumference --      Peak Flow --      Pain Score 03/26/19 2016 7   Constitutional: Alert and oriented.  Eyes: Conjunctivae are normal.  ENT      Head: Normocephalic and atraumatic.      Nose: No congestion/rhinnorhea.      Mouth/Throat: Mucous membranes are moist.      Neck: No stridor. Hematological/Lymphatic/Immunilogical: No cervical lymphadenopathy. Cardiovascular: Normal rate, regular rhythm.  No murmurs, rubs, or gallops.  Respiratory: Normal respiratory effort without tachypnea nor retractions. Breath sounds are clear and equal bilaterally. No wheezes/rales/rhonchi. Gastrointestinal: Soft and non tender. No rebound. No guarding.  Genitourinary: Deferred Musculoskeletal: Normal range of motion in all extremities. No lower extremity edema. Neurologic:  Normal speech and language. No gross focal neurologic deficits are appreciated.  Skin:  Skin is warm, dry and intact. No rash noted. Psychiatric: Mood and affect are normal. Speech and behavior are normal. Patient exhibits appropriate insight and judgment.  ____________________________________________    LABS (pertinent positives/negatives)  Lipase 37 Trop <0.03 CBC wbc 9.7, hgb 18.0, plt 234 CMP na 138, k 3.2, glu 141, cr 0.82 ____________________________________________   EKG  I, Nance Pear, attending physician, personally viewed and interpreted this EKG  EKG Time: 2013 Rate: 123 Rhythm: sinus tachycardia Axis: left axis deviation Intervals: qtc 466 QRS: narrow ST changes: no st elevation Impression: abnormal ekg   ____________________________________________    RADIOLOGY  CT abd.pel Concerning for duodenitis  ____________________________________________   PROCEDURES  Procedures  ____________________________________________   INITIAL IMPRESSION / ASSESSMENT AND PLAN / ED COURSE  Pertinent labs & imaging results that were available during my care of the patient were  reviewed by me and considered in my medical decision making (see chart for details).   Patient presented with concern for continued nausea, vomiting. Patient was discharged from the hospital yesterday where he had a cardiac work up performed primarily. Blood work today shows a slightly worsening bilirubin level. CT abd/pel was performed which was concerning for duodonetis. Patient was given one liter of IV fluids initially but continued to be tachycardic. Will plan on second liter. If patient feels better after IV fluids and  medication and heart rate improves would plan on discharging. If however he is not feeling better and has not improved discussed possibility of admission.    ____________________________________________   FINAL CLINICAL IMPRESSION(S) / ED DIAGNOSES  Final diagnoses:  Non-intractable vomiting with nausea, unspecified vomiting type  Dehydration  Epigastric pain     Note: This dictation was prepared with Dragon dictation. Any transcriptional errors that result from this process are unintentional     Nance Pear, MD 03/27/19 [phone]

## 2019-03-26 NOTE — Discharge Instructions (Addendum)
1.  Start Protonix 40 mg daily. 2.  Start Carafate before meals and at bedtime. 3.  Clear liquids x12 hours, then bland diet x1 week, then slowly advance diet as tolerated. 4.  You are leaving [facility].  Please note that you are welcome to return to the ER at any time, especially if you feel worse, have persistent vomiting, difficulty breathing or other concerns.

## 2019-03-26 NOTE — ED Triage Notes (Addendum)
Patient to ER for c/o emesis and lower mid abd pain. Patient was seen on Saturday (when symptoms began) and admitted to r/o ACS. Patient went home today, states N/V and abd pain have worsened.  Patient reports having a few episodes prior to admission of "really dark urine". +Cold sweats. Patient reports emesis being dark brown. Patient reports taking Zofran at home with no relief.

## 2019-03-26 NOTE — ED Provider Notes (Signed)
-----------------------------------------   11:37 PM on 03/26/2019 -----------------------------------------  After second liter of IV fluids, patient's pulse rate is 115.  Remains hypertensive but patient states he is uncomfortable being in the emergency department and very much desires to be discharged home.  I have cautioned him against this and he will be leaving [facility].  I have written prescriptions for Protonix and Carafate.  Looks like he was discharged from the hospital with prescriptions for ranitidine and Zofran.  He knows he is welcome to return at any time if he changes his mind or if his symptoms worsen.  Strict return precautions given.  Patient verbalizes understanding agrees with plan of care.   Paulette Blanch, MD 03/27/19 [phone]

## 2019-03-28 ENCOUNTER — Ambulatory Visit (INDEPENDENT_AMBULATORY_CARE_PROVIDER_SITE_OTHER): Payer: 59 | Admitting: Family Medicine

## 2019-03-28 ENCOUNTER — Encounter: Payer: Self-pay | Admitting: Family Medicine

## 2019-03-28 ENCOUNTER — Other Ambulatory Visit: Payer: Self-pay

## 2019-03-28 DIAGNOSIS — E876 Hypokalemia: Secondary | ICD-10-CM

## 2019-03-28 DIAGNOSIS — R1011 Right upper quadrant pain: Secondary | ICD-10-CM

## 2019-03-28 DIAGNOSIS — N400 Enlarged prostate without lower urinary tract symptoms: Secondary | ICD-10-CM

## 2019-03-28 DIAGNOSIS — D582 Other hemoglobinopathies: Secondary | ICD-10-CM

## 2019-03-28 DIAGNOSIS — I2510 Atherosclerotic heart disease of native coronary artery without angina pectoris: Secondary | ICD-10-CM

## 2019-03-28 DIAGNOSIS — E1165 Type 2 diabetes mellitus with hyperglycemia: Secondary | ICD-10-CM

## 2019-03-28 DIAGNOSIS — Z8042 Family history of malignant neoplasm of prostate: Secondary | ICD-10-CM

## 2019-03-28 DIAGNOSIS — D72829 Elevated white blood cell count, unspecified: Secondary | ICD-10-CM

## 2019-03-28 DIAGNOSIS — K2980 Duodenitis without bleeding: Secondary | ICD-10-CM

## 2019-03-28 DIAGNOSIS — E785 Hyperlipidemia, unspecified: Secondary | ICD-10-CM

## 2019-03-28 NOTE — Progress Notes (Signed)
Pulse 61    Ht 6' (1.829 m)    Wt 197 lb 9.6 oz (89.6 kg)    BMI 26.80 kg/m    Subjective:    Patient ID: John Hartman, male    DOB: 10/04/1971, 48 y.o.   MRN: [id number]  HPI: John Hartman is a 48 y.o. male  Chief Complaint  Patient presents with   Hospitalization Follow-up    dx duodenitis    HPI Virtual Visit via WebEx / Video Note   I connected with John Hartman on March 28, 2019 at 11:44 am EDT by WebEx / video and verified that I am speaking with the correct person using two identifiers.   Staff discussed the limitations, risks, security, and privacy concerns of performing an evaluation and management service by telephone and the availability of in-person appointments. Staff discussed with the patient that he/she may be responsible for charges related to this service. The patient expressed understanding and agreed to proceed. The patient was asked to check her surroundings, be sure to keep phone off of speaker, let me know immediately if someone comes near and our conversation has to be terminated or paused for privacy concerns.  Additional participants: wife  Patient location: home Provider location: home  Visit started: 11:44 am Visit terminated: 12:18 Total length of virtual visit: >=30 minutes  Patient has been hospitalized and to the ER since last seen; notes, CT scan, [place], labs reviewed briefly Started Saturday evening, nausea and vomiting; this has happened before and he'll get sick one night, just vomit once or twice and moves on This time, he threw up 3-4x over 3 hours, and after the last time, he felt chest pains and tingling and numbness and tingling in neck arm; pulse was high; admitted him Saturday night, hostpialized Sunday and still nauseated and vomiting; zofran and morphine; more foused on heart than GI; had a chemical stress test on Monday; Dr. Saralyn Pilar and Vicente Males were there; they got their data and then reversed it; heart looked good; sent him hoome with a barf  bag; he got sick again when he got home, sick again the next day; called after hours and went back to ER Diagnosed with duodenitis and responded to treatment immediately He says that they said he left AMA, but he didn't; it sounded like a miscommunication and they asked him if he was ready to leave, not actually leaving AMA, just thought he was ready to go Taking carafate and protonix and it's working really well Wife concerned about gallbladder disease He has had dark urine; he was drinking plenty of water and his urine was still dark; really dark urine before this episode started; he was well hydrated  His white blood count and bilirubinn were slightly elevated We reviewed the labs from the hospital Bilirubin rose from 0.8 to 3.2  Issues with urination; enlarged per CT Hx of prostate infection years ago He thought his grandfather died from prostate cancer when grandfather was in his 70s; however, later in the visit, he received clarification from his mother that he actually died from bone cancer  RUQ US Liver:  No focal lesion identified. Unchanged diffusely increased parenchymal echogenicity with focal fatty sparing along the gallbladder fossa. Portal vein is patent on color Doppler imaging with normal direction of blood flow towards the liver.  IMPRESSION: 1. No acute abnormality. 2. Unchanged hepatic steatosis.   Electronically Signed   By: Titus Dubin M.D.   On: 03/25/2019 13:53  Depression screen [facility] 2/9  03/28/2019 12/06/2018 11/13/2018 10/30/2018 10/29/2018  Decreased Interest 0 0 2 0 0  Down, Depressed, Hopeless 0 0 0 0 0  PHQ - 2 Score 0 0 2 0 0  Altered sleeping 0 1 3 0 0  Tired, decreased energy 0 3 3 0 1  Change in appetite 0 0 0 0 0  Feeling bad or failure about yourself  0 0 0 0 0  Trouble concentrating 0 0 0 0 0  Moving slowly or fidgety/restless 0 0 0 0 0  Suicidal thoughts 0 0 0 0 0  PHQ-9 Score 0 4 8 0 1  Difficult doing work/chores Not difficult at  all Not difficult at all Somewhat difficult Not difficult at all Not difficult at all  Some recent data might be hidden   Fall Risk  03/28/2019 12/06/2018 11/13/2018 11/13/2018 10/30/2018  Falls in the past year? 0 0 0 0 0  Comment - - - - -  Number falls in past yr: 0 - - - 0  Injury with Fall? 0 - - - 0    Relevant past medical, surgical, family and social history reviewed Past Medical History:  Diagnosis Date   Angioedema    Bronchitis    DDD (degenerative disc disease), cervical    Diabetes mellitus ([place])    Fatty liver 03/31/2017   [place] April 2018   GERD (gastroesophageal reflux disease)    Gout    Heart attack ([place])    HTN (hypertension)    Hypertension    CONTROLLED ON MEDS   IBS (irritable bowel syndrome)    LVH (left ventricular hypertrophy) 11/24/2018   Moderate, ECHO   Myocardial infarction ([place]) 08/23/2018   Seasonal allergies    Spinal stenosis    Past Surgical History:  Procedure Laterality Date   ACDF with fusion  2007   CARDIAC CATHETERIZATION Left 08/15/2016   Procedure: Left Heart Cath and Coronary Angiography;  Surgeon: Wellington Hampshire, MD;  Location: [facility];  Service: Cardiovascular;  Laterality: Left;   COLONOSCOPY WITH PROPOFOL N/A 06/26/2015   Procedure: COLONOSCOPY WITH PROPOFOL;  Surgeon: Lucilla Lame, MD;  Location: [facility];  Service: Endoscopy;  Laterality: N/A;  with biopsies   CORONARY/GRAFT ACUTE MI REVASCULARIZATION N/A 08/23/2018   Procedure: Coronary/Graft Acute MI Revascularization;  Surgeon: Isaias Cowman, MD;  Location: [facility];  Service: Cardiovascular;  Laterality: N/A;   LEFT HEART CATH AND CORONARY ANGIOGRAPHY N/A 08/23/2018   Procedure: LEFT HEART CATH AND CORONARY ANGIOGRAPHY;  Surgeon: Isaias Cowman, MD;  Location: [facility];  Service: Cardiovascular;  Laterality: N/A;   NASAL SEPTUM SURGERY  2004   SHOULDER SURGERY Left 2007   Testicular torsion  1980s     VASECTOMY     Family History  Problem Relation Age of Onset   Breast cancer Mother    Skin cancer Mother    Arrhythmia Mother        A-fib   Cancer Mother        breast, colon?, skin   Hypertension Father    Diabetes Father    Cancer Maternal Grandfather    Heart disease Brother        stent   Allergies Son    Alzheimer's disease Maternal Grandmother    Stroke Neg Hx    COPD Neg Hx    Social History   Tobacco Use   Smoking status: Former Smoker    Packs/day: 1.00    Years: 10.00  Pack years: 10.00    Types: Cigarettes    Last attempt to quit: 04/15/2015    Years since quitting: 3.9   Smokeless tobacco: Never Used  Substance Use Topics   Alcohol use: No    Alcohol/week: 0.0 standard drinks   Drug use: No     Office Visit from 03/28/2019 in [facility]  AUDIT-C Score  0      Interim medical history since last visit reviewed. Allergies and medications reviewed  Review of Systems Per HPI unless specifically indicated above     Objective:    Pulse 61    Ht 6' (1.829 m)    Wt 197 lb 9.6 oz (89.6 kg)    BMI 26.80 kg/m   Wt Readings from Last 3 Encounters:  03/28/19 197 lb 9.6 oz (89.6 kg)  03/26/19 199 lb 8.3 oz (90.5 kg)  03/25/19 199 lb 9.6 oz (90.5 kg)    Physical Exam Constitutional:      General: He is not in acute distress. Eyes:     General: No scleral icterus. Pulmonary:     Effort: No tachypnea or respiratory distress.  Skin:    Coloration: Skin is not jaundiced or pale.  Neurological:     Mental Status: He is alert.  Psychiatric:        Mood and Affect: Mood is not anxious or depressed.        Speech: Speech is not rapid and pressured, delayed or slurred.        Behavior: Behavior normal.        Cognition and Memory: Cognition normal.     Results for orders placed or performed during the hospital encounter of 03/26/19  Comprehensive metabolic panel  Result Value Ref Range   Sodium 138 135 - 145  mmol/L   Potassium 3.2 (L) 3.5 - 5.1 mmol/L   Chloride 101 98 - 111 mmol/L   CO2 19 (L) 22 - 32 mmol/L   Glucose, Bld 141 (H) 70 - 99 mg/dL   BUN 16 6 - 20 mg/dL   Creatinine, Ser 0.82 0.61 - 1.24 mg/dL   Calcium 9.7 8.9 - 10.3 mg/dL   Total Protein 8.6 (H) 6.5 - 8.1 g/dL   Albumin 4.9 3.5 - 5.0 g/dL   AST 31 15 - 41 U/L   ALT 34 0 - 44 U/L   Alkaline Phosphatase 92 38 - 126 U/L   Total Bilirubin 3.2 (H) 0.3 - 1.2 mg/dL   GFR calc non Af Amer >60 >60 mL/min   GFR calc Af Amer >60 >60 mL/min   Anion gap 18 (H) 5 - 15  Lipase, blood  Result Value Ref Range   Lipase 37 11 - 51 U/L  CBC with Differential  Result Value Ref Range   WBC 9.7 4.0 - 10.5 K/uL   RBC 6.75 (H) 4.22 - 5.81 MIL/uL   Hemoglobin 18.0 (H) 13.0 - 17.0 g/dL   HCT 53.5 (H) 39.0 - 52.0 %   MCV 79.3 (L) 80.0 - 100.0 fL   MCH 26.7 26.0 - 34.0 pg   MCHC 33.6 30.0 - 36.0 g/dL   RDW 14.9 11.5 - 15.5 %   Platelets 234 150 - 400 K/uL   nRBC 0.0 0.0 - 0.2 %   Neutrophils Relative % 67 %   Neutro Abs 6.4 1.7 - 7.7 K/uL   Lymphocytes Relative 20 %   Lymphs Abs 2.0 0.7 - 4.0 K/uL   Monocytes Relative 9 %  Monocytes Absolute 0.9 0.1 - 1.0 K/uL   Eosinophils Relative 3 %   Eosinophils Absolute 0.3 0.0 - 0.5 K/uL   Basophils Relative 1 %   Basophils Absolute 0.1 0.0 - 0.1 K/uL   Immature Granulocytes 0 %   Abs Immature Granulocytes 0.03 0.00 - 0.07 K/uL  Troponin I - ONCE - STAT  Result Value Ref Range   Troponin I <0.03 <0.03 ng/mL      Assessment & Plan:   Problem List Items Addressed This Visit      Other   Elevated hemoglobin ([place])    Other Visit Diagnoses    Hyperbilirubinemia    -  Primary   noted the rise in bilirubin recently; reviewed CT scan and [place]; wife concerned about bilirubinuria, will check blood and urine; notifying GI to review   Relevant Orders   Hepatic function panel   Urinalysis w microscopic + reflex cultur   Duodenitis       much improved with carafate and PPI; notifying GI  specialist   Enlarged prostate       initially, we thought his MGF died in his 29s from prostate cancer; referral entered; later found it was bone cancer (mets?); check PSA, refer to URO   Relevant Orders   PSA   Ambulatory referral to Urology   Leukocytosis, unspecified type       noted on labs 4 days ago, back to normal, but rise in RBC, H/H; will check CBC today (discussed safety regarding coming to office for labs)   Relevant Orders   CBC with Differential/Platelet   Hypokalemia       noted on last labs; will check today   Relevant Orders   BASIC METABOLIC PANEL WITH GFR   Right upper quadrant pain       wife would like HIDA scan, reviewed no stones on [place]; ordered, notifying GI   Relevant Orders   NM Hepato W/Eject Fract   Family hx of prostate cancer       this was initially entered w URO referral; however, dx was actually bone cancer per his mother; will delete from chart after note signed (already assoc w/order)   Relevant Orders   Ambulatory referral to Urology       Follow up plan: No follow-ups on file.  An after-visit summary was printed and given to the patient at [facility].  Please see the patient instructions which may contain other information and recommendations beyond what is mentioned above in the assessment and plan.  No orders of the defined types were placed in this encounter.   Orders Placed This Encounter  Procedures   NM Hepato W/Eject Fract   Hepatic function panel   PSA   BASIC METABOLIC PANEL WITH GFR   CBC with Differential/Platelet   Urinalysis w microscopic + reflex cultur   Ambulatory referral to Urology

## 2019-04-01 ENCOUNTER — Encounter: Payer: Self-pay | Admitting: Family Medicine

## 2019-04-02 LAB — BASIC METABOLIC PANEL WITH GFR
BUN: 11 mg/dL (ref 7–25)
CO2: 25 mmol/L (ref 20–32)
Calcium: 9 mg/dL (ref 8.6–10.3)
Chloride: 107 mmol/L (ref 98–110)
Creat: 0.85 mg/dL (ref 0.60–1.35)
GFR, Est African American: 122 mL/min/{1.73_m2} (ref >=60)
GFR, Est Non African American: 105 mL/min/{1.73_m2} (ref >=60)
Glucose, Bld: 119 mg/dL — ABNORMAL HIGH (ref 65–99)
Potassium: 3.5 mmol/L (ref 3.5–5.3)
Sodium: 143 mmol/L (ref 135–146)

## 2019-04-02 LAB — HEPATIC FUNCTION PANEL
AG Ratio: 2.1 (calc) (ref 1.0–2.5)
ALT: 27 U/L (ref 9–46)
AST: 24 U/L (ref 10–40)
Albumin: 4.4 g/dL (ref 3.6–5.1)
Alkaline phosphatase (APISO): 77 U/L (ref 36–130)
Bilirubin, Direct: 0.4 mg/dL — ABNORMAL HIGH (ref 0.0–0.2)
Globulin: 2.1 g/dL (calc) (ref 1.9–3.7)
Indirect Bilirubin: 1.7 mg/dL (calc) — ABNORMAL HIGH (ref 0.2–1.2)
Total Bilirubin: 2.1 mg/dL — ABNORMAL HIGH (ref 0.2–1.2)
Total Protein: 6.5 g/dL (ref 6.1–8.1)

## 2019-04-02 LAB — CARDIO IQ ADV LIPID AND INFLAMM PNL
Apolipoprotein B: 69 mg/dL
Cholesterol: 103 mg/dL (ref <200)
HDL: 25 mg/dL — ABNORMAL LOW (ref >=40)
LDL Cholesterol (Calc): 58 mg/dL (calc) (ref <100)
LDL Large: 4335 nmol/L — ABNORMAL LOW (ref >6729)
LDL Medium: 179 nmol/L (ref <215)
LDL Particle Number: 899 nmol/L (ref <1138)
LDL Peak Size: 215.6 Angstrom — ABNORMAL LOW (ref >222.9)
LDL Small: 179 nmol/L — ABNORMAL HIGH (ref <142)
Lipoprotein (a): 179 nmol/L — ABNORMAL HIGH (ref <75)
Non-HDL Cholesterol (Calc): 78 mg/dL (calc) (ref <130)
PLAC: 92 nmol/min/mL (ref <=123)
Total CHOL/HDL Ratio: 4.1 calc (ref <5.0)
Triglycerides: 116 mg/dL (ref <150)
hs-CRP: 4.9 mg/L — ABNORMAL HIGH

## 2019-04-02 LAB — CBC WITH DIFFERENTIAL/PLATELET
Absolute Monocytes: 524 cells/uL (ref 200–950)
Basophils Absolute: 62 cells/uL (ref 0–200)
Basophils Relative: 0.9 %
Eosinophils Absolute: 435 cells/uL (ref 15–500)
Eosinophils Relative: 6.3 %
HCT: 44.3 % (ref 38.5–50.0)
Hemoglobin: 14.9 g/dL (ref 13.2–17.1)
Lymphs Abs: 1656 cells/uL (ref 850–3900)
MCH: 27.6 pg (ref 27.0–33.0)
MCHC: 33.6 g/dL (ref 32.0–36.0)
MCV: 82 fL (ref 80.0–100.0)
MPV: 10.5 fL (ref 7.5–12.5)
Monocytes Relative: 7.6 %
Neutro Abs: 4223 cells/uL (ref 1500–7800)
Neutrophils Relative %: 61.2 %
Platelets: 204 10*3/uL (ref 140–400)
RBC: 5.4 10*6/uL (ref 4.20–5.80)
RDW: 14.1 % (ref 11.0–15.0)
Total Lymphocyte: 24 %
WBC: 6.9 10*3/uL (ref 3.8–10.8)

## 2019-04-02 LAB — URINALYSIS W MICROSCOPIC + REFLEX CULTURE
Bacteria, UA: NONE SEEN /HPF
Bilirubin Urine: NEGATIVE
Hgb urine dipstick: NEGATIVE
Hyaline Cast: NONE SEEN /LPF
Ketones, ur: NEGATIVE
Leukocyte Esterase: NEGATIVE
Nitrites, Initial: NEGATIVE
Protein, ur: NEGATIVE
RBC / HPF: NONE SEEN /HPF (ref 0–2)
Specific Gravity, Urine: 1.028 (ref 1.001–1.03)
Squamous Epithelial / HPF: NONE SEEN /HPF (ref <=5)
WBC, UA: NONE SEEN /HPF (ref 0–5)
pH: 5.5 (ref 5.0–8.0)

## 2019-04-02 LAB — NO CULTURE INDICATED

## 2019-04-02 LAB — PSA: PSA: 0.4 ng/mL (ref <=4.0)

## 2019-04-05 ENCOUNTER — Ambulatory Visit
Admission: RE | Admit: 2019-04-05 | Discharge: 2019-04-05 | Disposition: A | Discharge status: Home / Self Care | Payer: 59 | Source: Ambulatory Visit | Attending: Family Medicine | Admitting: Family Medicine

## 2019-04-05 ENCOUNTER — Other Ambulatory Visit: Payer: Self-pay

## 2019-04-05 DIAGNOSIS — R1011 Right upper quadrant pain: Secondary | ICD-10-CM | POA: Insufficient documentation

## 2019-04-05 MED ORDER — TECHNETIUM TC 99M MEBROFENIN IV KIT
5.3400 | PACK | Freq: Once | INTRAVENOUS | Status: AC | PRN
  Administered 2019-04-05: 5.34 via INTRAVENOUS

## 2019-04-16 ENCOUNTER — Encounter: Payer: Self-pay | Admitting: Family Medicine

## 2019-06-07 ENCOUNTER — Ambulatory Visit: Payer: 59 | Admitting: Urology

## 2019-06-11 IMAGING — CR DG CHEST 2V
1 series · 2 of 2 positions shown · non-contrast
Comparison: Chest radiograph-07/17/2008; chest CT-07/18/2008;
lumbar spine radiographs-01/02/2018

CLINICAL DATA: Generalized chest pain beginning yesterday.

EXAM:
CHEST - 2 VIEW

[Series 1: dg chest 2 view · 0.14mm/px · 2 of 2 slices shown]
[im 1/2]
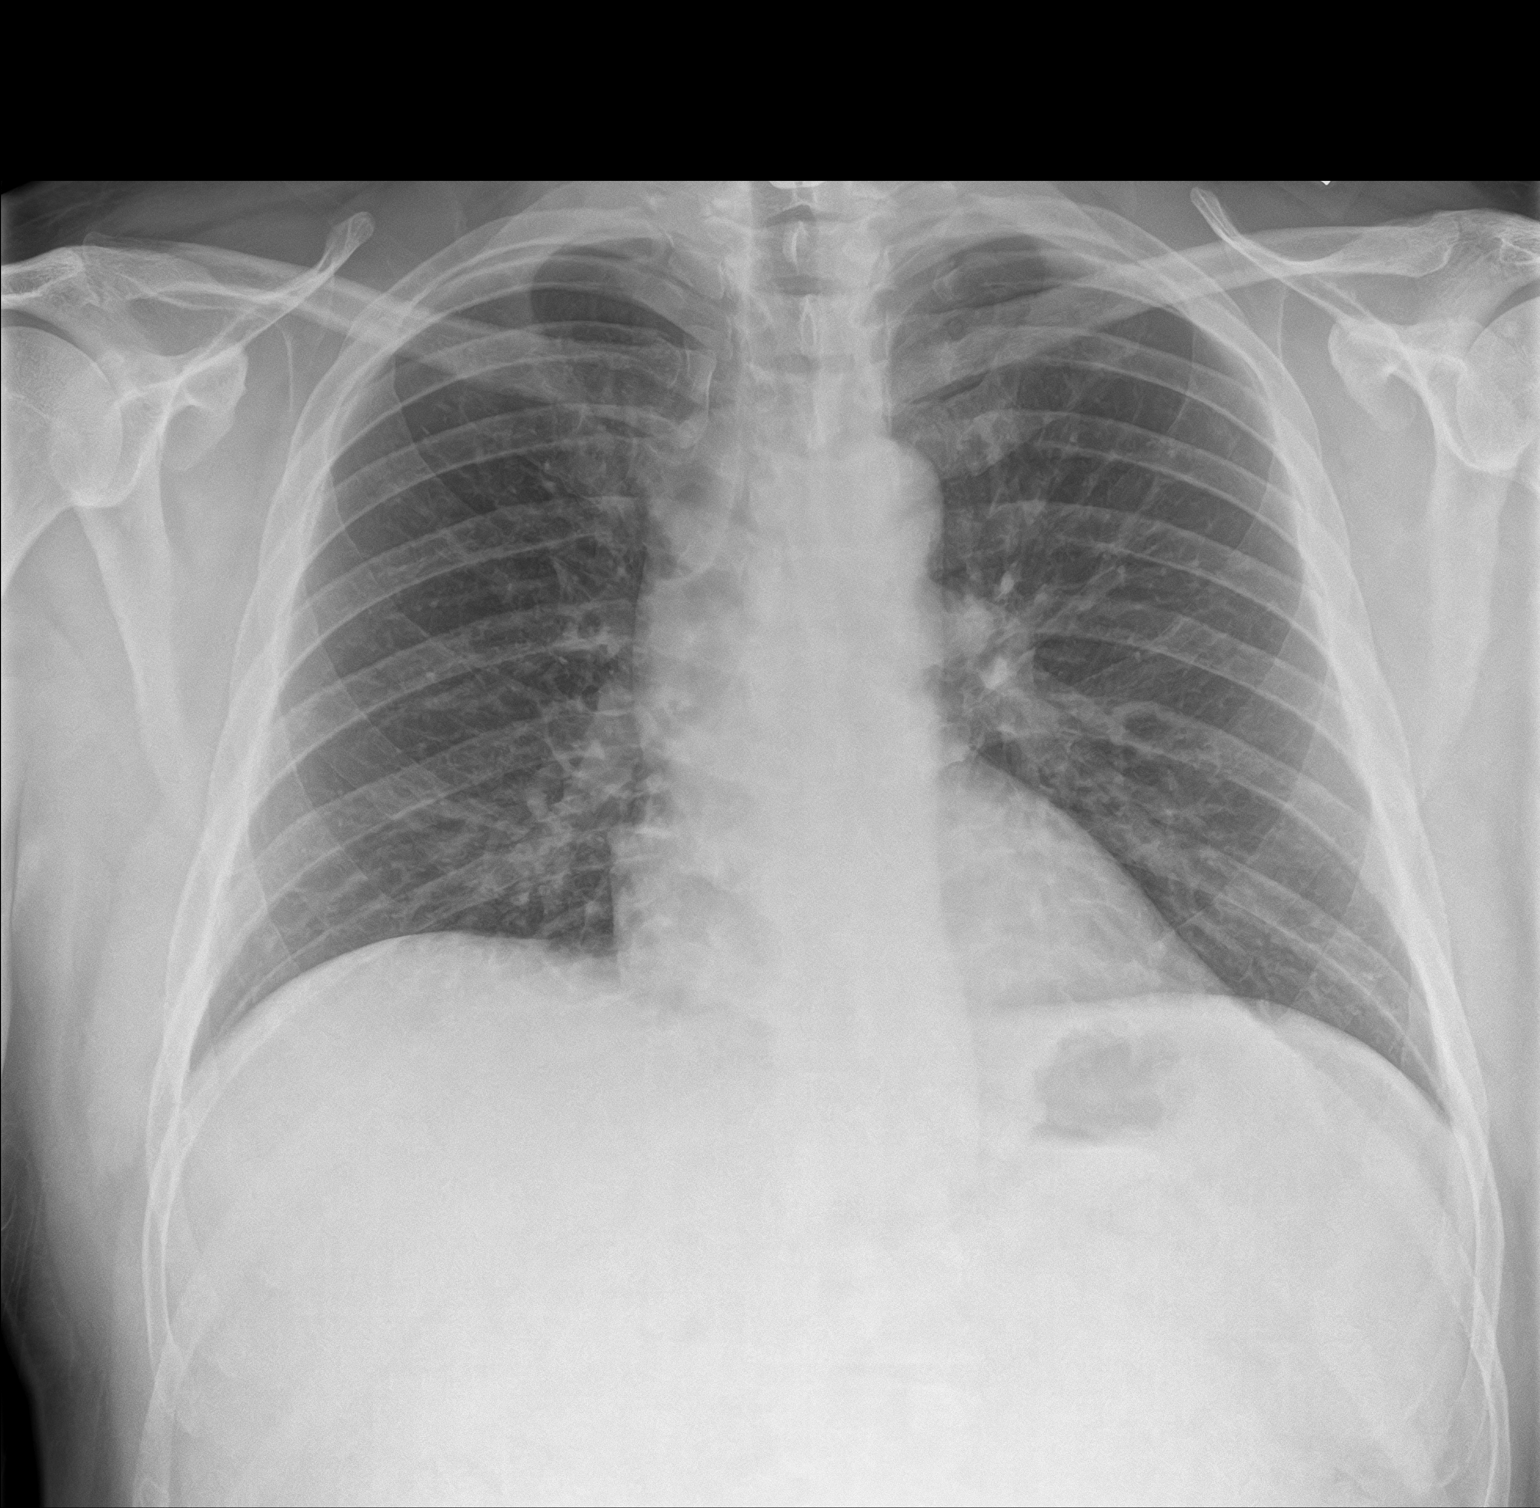
[im 2/2]
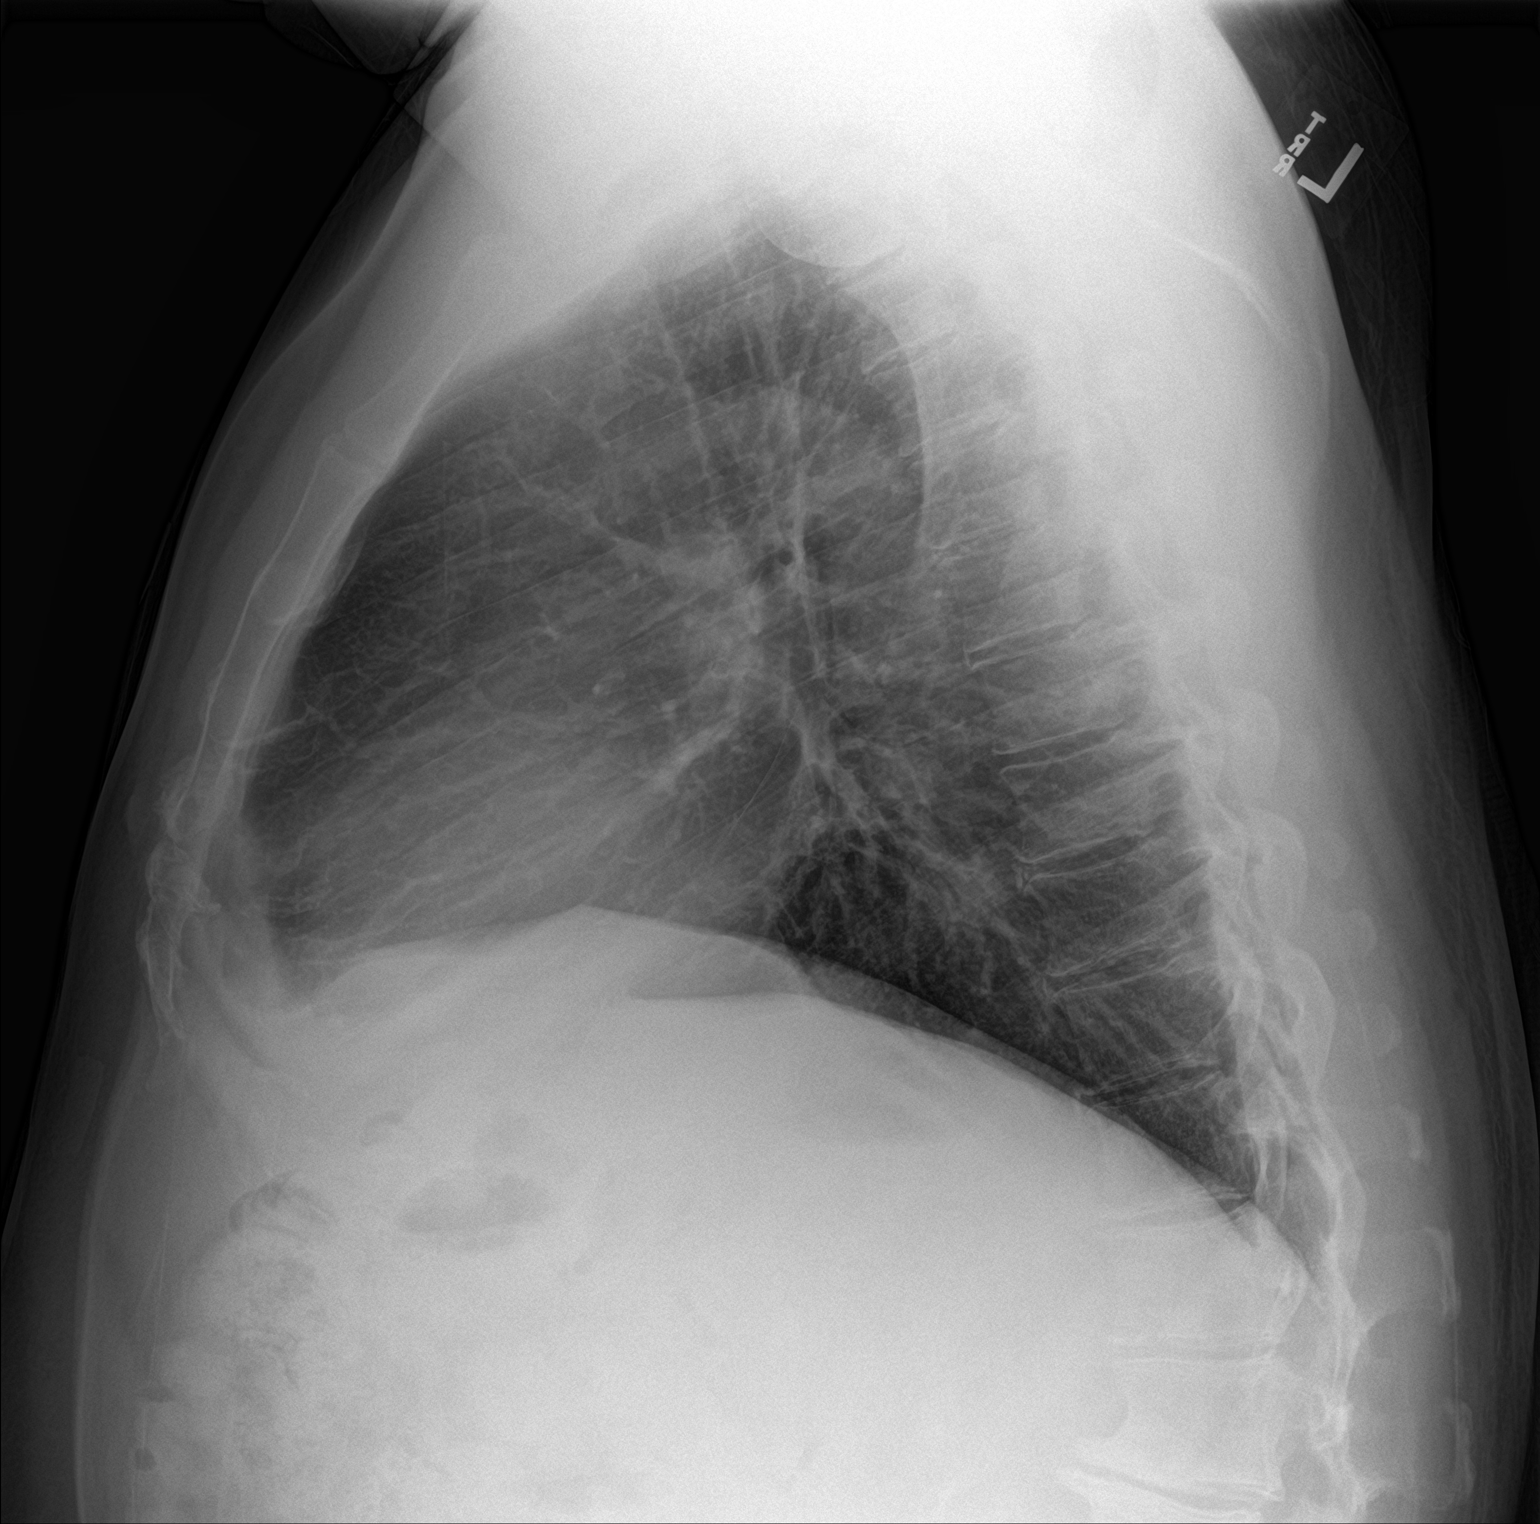

[2 of 2 positions shown; findings below may reference images not displayed]

FINDINGS: Grossly unchanged cardiac silhouette and mediastinal contours.
Bilateral infrahilar heterogeneous opacities are unchanged and
favored to represent atelectasis. No new focal airspace opacities.
No pleural effusion or pneumothorax. No evidence of edema. No acute
osseus abnormalities. Post lower cervical ACDF, incompletely
evaluated. Degenerative change of the superior aspect of the lumbar
spine, incompletely evaluated though grossly similar to lumbar spine
radiographs performed [DATE].
IMPRESSION: No acute cardiopulmonary disease.

## 2019-07-05 ENCOUNTER — Other Ambulatory Visit: Payer: Self-pay

## 2019-07-05 DIAGNOSIS — N400 Enlarged prostate without lower urinary tract symptoms: Secondary | ICD-10-CM

## 2019-07-09 ENCOUNTER — Other Ambulatory Visit
Admission: RE | Admit: 2019-07-09 | Discharge: 2019-07-09 | Disposition: A | Discharge status: Home / Self Care | Payer: 59 | Attending: Urology | Admitting: Urology

## 2019-07-09 ENCOUNTER — Encounter: Payer: Self-pay | Admitting: Urology

## 2019-07-09 ENCOUNTER — Ambulatory Visit (INDEPENDENT_AMBULATORY_CARE_PROVIDER_SITE_OTHER): Payer: 59 | Admitting: Urology

## 2019-07-09 ENCOUNTER — Other Ambulatory Visit: Payer: Self-pay

## 2019-07-09 VITALS — BP 149/97 | HR 89 | Ht 71.0 in | Wt 200.0 lb

## 2019-07-09 DIAGNOSIS — N400 Enlarged prostate without lower urinary tract symptoms: Secondary | ICD-10-CM | POA: Insufficient documentation

## 2019-07-09 DIAGNOSIS — N200 Calculus of kidney: Secondary | ICD-10-CM | POA: Diagnosis not present

## 2019-07-09 LAB — URINALYSIS, COMPLETE (UACMP) WITH MICROSCOPIC
Bacteria, UA: NONE SEEN
Bilirubin Urine: NEGATIVE
Glucose, UA: >1000 mg/dL — AB
Hgb urine dipstick: NEGATIVE
Leukocytes,Ua: NEGATIVE
Nitrite: NEGATIVE
Protein, ur: NEGATIVE mg/dL
Specific Gravity, Urine: 1.02 (ref 1.005–1.030)
WBC, UA: NONE SEEN WBC/hpf (ref 0–5)
pH: 5 (ref 5.0–8.0)

## 2019-07-09 LAB — BLADDER SCAN AMB NON-IMAGING

## 2019-07-09 NOTE — Progress Notes (Signed)
07/09/19 3:47 PM   John Hartman [birth year] [id number]  Referring provider: Arnetha Courser, MD [address] [place],  [place] [address]  CC: BPH, nephrolithiasis  HPI: I saw [name] in urology clinic today in consultation from Dr. Sanda Hartman for BPH and nephrolithiasis.  He is a 48 year old male with no family history of prostate cancer who was found to have a mildly enlarged prostate of 38 g on recent CT scan.  PSA was normal at 0.4.  He also had a non-obstructing left lower pole 8 mm renal stone.  He denies any flank pain or history of nephrolithiasis.  He has very mild urinary symptoms of intermittency and occasional weak stream.  He is minimally bothered by his urinary symptoms, and does not want to start any new medications.  His past medical history is notable for CAD with cardiac stent placement in August 2019, and he remains on anticoagulation.  There are no aggravating or alleviating factors.  Severity is mild.  He denies any gross hematuria, dysuria, or flank pain.  Of note, he is changed to a plant-based diet, and consumes a high volume of spinach, kale, and other root-based vegetables.   PMH: Past Medical History:  Diagnosis Date  . Angioedema   . Bronchitis   . DDD (degenerative disc disease), cervical   . Diabetes mellitus ([place])   . Fatty liver 03/31/2017   [place] April 2018  . GERD (gastroesophageal reflux disease)   . Gout   . Heart attack ([place])   . HTN (hypertension)   . Hypertension    CONTROLLED ON MEDS  . IBS (irritable bowel syndrome)   . LVH (left ventricular hypertrophy) 11/24/2018   Moderate, ECHO  . Myocardial infarction ([place]) 08/23/2018  . Seasonal allergies   . Spinal stenosis     Surgical History: Past Surgical History:  Procedure Laterality Date  . ACDF with fusion  2007  . CARDIAC CATHETERIZATION Left 08/15/2016   Procedure: Left Heart Cath and Coronary Angiography;  Surgeon: Wellington Hampshire, MD;  Location: [facility];  Service:  Cardiovascular;  Laterality: Left;  . COLONOSCOPY WITH PROPOFOL N/A 06/26/2015   Procedure: COLONOSCOPY WITH PROPOFOL;  Surgeon: Lucilla Lame, MD;  Location: [facility];  Service: Endoscopy;  Laterality: N/A;  with biopsies  . CORONARY/GRAFT ACUTE MI REVASCULARIZATION N/A 08/23/2018   Procedure: Coronary/Graft Acute MI Revascularization;  Surgeon: Isaias Cowman, MD;  Location: [facility];  Service: Cardiovascular;  Laterality: N/A;  . LEFT HEART CATH AND CORONARY ANGIOGRAPHY N/A 08/23/2018   Procedure: LEFT HEART CATH AND CORONARY ANGIOGRAPHY;  Surgeon: Isaias Cowman, MD;  Location: [facility];  Service: Cardiovascular;  Laterality: N/A;  . NASAL SEPTUM SURGERY  2004  . SHOULDER SURGERY Left 2007  . Testicular torsion  1980s  . VASECTOMY      Allergies:  Allergies  Allergen Reactions  . Lisinopril Swelling  . Ace Inhibitors Swelling    Family History: Family History  Problem Relation Age of Onset  . Breast cancer Mother   . Skin cancer Mother   . Arrhythmia Mother        A-fib  . Cancer Mother        breast, colon?, skin  . Hypertension Father   . Diabetes Father   . Cancer Maternal Grandfather   . Heart disease Brother        stent  . Allergies Son   . Alzheimer's disease Maternal Grandmother   . Stroke Neg  Hx   . COPD Neg Hx     Social History:  reports that he quit smoking about 4 years ago. His smoking use included cigarettes. He has a 10.00 pack-year smoking history. He has never used smokeless tobacco. He reports that he does not drink alcohol or use drugs.  ROS: Please see flowsheet from today's date for complete review of systems.  Physical Exam: BP (!) 149/97 (BP Location: Left Arm, Patient Position: Sitting)   Pulse 89   Ht 5\' 11"  (1.803 m)   Wt 200 lb (90.7 kg)   BMI 27.89 kg/m    Constitutional:  Alert and oriented, No acute distress. Cardiovascular: No clubbing, cyanosis, or edema. Respiratory: Normal  respiratory effort, no increased work of breathing. GI: Abdomen is soft, nontender, nondistended, no abdominal masses GU: No CVA tenderness Lymph: No cervical or inguinal lymphadenopathy. Skin: No rashes, bruises or suspicious lesions. Neurologic: Grossly intact, no focal deficits, moving all 4 extremities. Psychiatric: Normal mood and affect.  Laboratory Data: Reviewed Urinalysis today 0 WBCs, 0 RBCs, no bacteria, nitrite negative, pH 5  Pertinent Imaging: I have personally reviewed the CT from 03/26/2019.  Prostate measures 38 g.  There is an 8 mm nonobstructing left lower pole renal stone.  1100 HU no think-year-old today [employer] with was a, 12 cm skin stone distance  Assessment & Plan:   In summary, the patient is a 48 year old male with mild BPH and 8 mm left lower pole nonobstructing renal stone.  We discussed various treatment options for urolithiasis including observation with or without medical expulsive therapy, shockwave lithotripsy (SWL), ureteroscopy and laser lithotripsy with stent placement, and percutaneous nephrolithotomy.  We discussed that management is based on stone size, location, density, patient co-morbidities, and patient preference.   Stones <1mm in size have a >80% spontaneous passage rate. Data surrounding the use of tamsulosin for medical expulsive therapy is controversial, but meta analyses suggests it is most efficacious for distal stones between 5-63mm in size. Possible side effects include dizziness/lightheadedness, and retrograde ejaculation.  SWL has a lower stone free rate in a single procedure, but also a lower complication rate compared to ureteroscopy and avoids a stent and associated stent related symptoms. Possible complications include renal hematoma, steinstrasse, and need for additional treatment.  Ureteroscopy with laser lithotripsy and stent placement has a higher stone free rate than SWL in a single procedure, however increased  complication rate including possible infection, ureteral injury, bleeding, and stent related morbidity. Common stent related symptoms include dysuria, urgency/frequency, and flank pain.  Observation for his nonobstructing left renal stone is also very reasonable, especially in the setting of his MI and anticoagulation within the last year.  We discussed general stone prevention strategies including adequate hydration with goal of producing 2.5 L of urine daily, increasing citric acid intake, increasing calcium intake during high oxalate meals, minimizing animal protein, and decreasing salt intake. Information about dietary recommendations given today.   -Surveillance for left renal stone, RTC 6 months with KUB.  We discussed return precautions including fevers and chills with flank pain, or uncontrolled left renal colic -Consider Flomax in the future if worsening urinary symptoms   Billey Co, MD  [facility] [address] [place], [place] [address] [phone]

## 2019-07-09 NOTE — Patient Instructions (Signed)
Limit spinach in the diet. If eating spinach or other high oxalate foods(see below), need to consume some calcium in the diet to bind the oxalate and prevent kidney stones.   Dietary Guidelines to Help Prevent Kidney Stones Kidney stones are deposits of minerals and salts that form inside your kidneys. Your risk of developing kidney stones may be greater depending on your diet, your lifestyle, the medicines you take, and whether you have certain medical conditions. Most people can reduce their chances of developing kidney stones by following the instructions below. Depending on your overall health and the type of kidney stones you tend to develop, your dietitian may give you more specific instructions. What are tips for following this plan? Reading food labels  Choose foods with "no salt added" or "low-salt" labels. Limit your sodium intake to less than 1500 mg per day.  Choose foods with calcium for each meal and snack. Try to eat about 300 mg of calcium at each meal. Foods that contain 200-500 mg of calcium per serving include: ? 8 oz (237 ml) of milk, fortified nondairy milk, and fortified fruit juice. ? 8 oz (237 ml) of kefir, yogurt, and soy yogurt. ? 4 oz (118 ml) of tofu. ? 1 oz of cheese. ? 1 cup (300 g) of dried figs. ? 1 cup (91 g) of cooked broccoli. ? 1-3 oz can of sardines or mackerel.  Most people need 1000 to 1500 mg of calcium each day. Talk to your dietitian about how much calcium is recommended for you. Shopping  Buy plenty of fresh fruits and vegetables. Most people do not need to avoid fruits and vegetables, even if they contain nutrients that may contribute to kidney stones.  When shopping for convenience foods, choose: ? Whole pieces of fruit. ? Premade salads with dressing on the side. ? Low-fat fruit and yogurt smoothies.  Avoid buying frozen meals or prepared deli foods.  Look for foods with live cultures, such as yogurt and kefir. Cooking  Do not add salt  to food when cooking. Place a salt shaker on the table and allow each person to add his or her own salt to taste.  Use vegetable protein, such as beans, textured vegetable protein (TVP), or tofu instead of meat in pasta, casseroles, and soups. Meal planning   Eat less salt, if told by your dietitian. To do this: ? Avoid eating processed or premade food. ? Avoid eating fast food.  Eat less animal protein, including cheese, meat, poultry, or fish, if told by your dietitian. To do this: ? Limit the number of times you have meat, poultry, fish, or cheese each week. Eat a diet free of meat at least 2 days a week. ? Eat only one serving each day of meat, poultry, fish, or seafood. ? When you prepare animal protein, cut pieces into small portion sizes. For most meat and fish, one serving is about the size of one deck of cards.  Eat at least 5 servings of fresh fruits and vegetables each day. To do this: ? Keep fruits and vegetables on hand for snacks. ? Eat 1 piece of fruit or a handful of berries with breakfast. ? Have a salad and fruit at lunch. ? Have two kinds of vegetables at dinner.  Limit foods that are high in a substance called oxalate. These include: ? Spinach. ? Rhubarb. ? Beets. ? Potato chips and french fries. ? Nuts.  If you regularly take a diuretic medicine, make sure to eat at  least 1-2 fruits or vegetables high in potassium each day. These include: ? Avocado. ? Banana. ? Orange, prune, carrot, or tomato juice. ? Baked potato. ? Cabbage. ? Beans and split peas. General instructions   Drink enough fluid to keep your urine clear or pale yellow. This is the most important thing you can do.  Talk to your health care provider and dietitian about taking daily supplements. Depending on your health and the cause of your kidney stones, you may be advised: ? Not to take supplements with vitamin C. ? To take a calcium supplement. ? To take a daily probiotic supplement. ? To  take other supplements such as magnesium, fish oil, or vitamin B6.  Take all medicines and supplements as told by your health care provider.  Limit alcohol intake to no more than 1 drink a day for nonpregnant women and 2 drinks a day for men. One drink equals 12 oz of beer, 5 oz of wine, or 1 oz of hard liquor.  Lose weight if told by your health care provider. Work with your dietitian to find strategies and an eating plan that works best for you. What foods are not recommended? Limit your intake of the following foods, or as told by your dietitian. Talk to your dietitian about specific foods you should avoid based on the type of kidney stones and your overall health. Grains Breads. Bagels. Rolls. Baked goods. Salted crackers. Cereal. Pasta. Vegetables Spinach. Rhubarb. Beets. Canned vegetables. Angie Fava. Olives. Meats and other protein foods Nuts. Nut butters. Large portions of meat, poultry, or fish. Salted or cured meats. Deli meats. Hot dogs. Sausages. Dairy Cheese. Beverages Regular soft drinks. Regular vegetable juice. Seasonings and other foods Seasoning blends with salt. Salad dressings. Canned soups. Soy sauce. Ketchup. Barbecue sauce. Canned pasta sauce. Casseroles. Pizza. Lasagna. Frozen meals. Potato chips. [place] fries. Summary  You can reduce your risk of kidney stones by making changes to your diet.  The most important thing you can do is drink enough fluid. You should drink enough fluid to keep your urine clear or pale yellow.  Ask your health care provider or dietitian how much protein from animal sources you should eat each day, and also how much salt and calcium you should have each day. This information is not intended to replace advice given to you by your health care provider. Make sure you discuss any questions you have with your health care provider. Document Released: 04/08/2011 Document Revised: 04/03/2019 Document Reviewed: 11/22/2016 Elsevier Patient  Education  2020 [employer].

## 2019-07-16 ENCOUNTER — Encounter: Payer: Self-pay | Admitting: Student in an Organized Health Care Education/Training Program

## 2019-07-16 ENCOUNTER — Telehealth: Payer: Self-pay

## 2019-07-16 NOTE — Telephone Encounter (Signed)
Patient was called to review his medication. No answer and message was left on answering service.

## 2019-07-17 ENCOUNTER — Encounter: Payer: Self-pay | Admitting: Student in an Organized Health Care Education/Training Program

## 2019-07-17 ENCOUNTER — Ambulatory Visit
Payer: 59 | Attending: Student in an Organized Health Care Education/Training Program | Admitting: Student in an Organized Health Care Education/Training Program

## 2019-07-17 ENCOUNTER — Other Ambulatory Visit: Payer: Self-pay

## 2019-07-17 DIAGNOSIS — M47816 Spondylosis without myelopathy or radiculopathy, lumbar region: Secondary | ICD-10-CM

## 2019-07-17 DIAGNOSIS — M5416 Radiculopathy, lumbar region: Secondary | ICD-10-CM | POA: Diagnosis not present

## 2019-07-17 DIAGNOSIS — M48062 Spinal stenosis, lumbar region with neurogenic claudication: Secondary | ICD-10-CM | POA: Diagnosis not present

## 2019-07-17 DIAGNOSIS — G894 Chronic pain syndrome: Secondary | ICD-10-CM

## 2019-07-17 DIAGNOSIS — M5136 Other intervertebral disc degeneration, lumbar region: Secondary | ICD-10-CM

## 2019-07-17 MED ORDER — TIZANIDINE HCL 4 MG PO TABS: 4.0000 mg | ORAL_TABLET | Freq: Three times a day (TID) | ORAL | 5 refills | Status: DC | PRN

## 2019-07-17 MED ORDER — BELBUCA 450 MCG BU FILM: 1.0000 | ORAL_FILM | Freq: Two times a day (BID) | BUCCAL | 1 refills | Status: DC

## 2019-07-17 MED ORDER — PREGABALIN 50 MG PO CAPS: 50.0000 mg | ORAL_CAPSULE | Freq: Three times a day (TID) | ORAL | 2 refills | Status: DC

## 2019-07-17 NOTE — Progress Notes (Signed)
Pain Management Virtual Encounter Note - Virtual Visit via Telephone Telehealth (real-time audio visits between healthcare provider and patient).   Patient's Phone No. & Preferred Pharmacy:  [phone] (home); [phone] (mobile); (Preferred) [phone] [email or web address]  [id number] [address], [place] [address] Phone: [phone] Fax: [place], [facility] [address] [place] [address] [facility] [place] [address] Phone: [phone] Fax: [phone]    Pre-screening note:  Our staff contacted Mr. Dafoe and offered him an "in person", "face-to-face" appointment versus a telephone encounter. He indicated preferring the telephone encounter, at this time.   Reason for Virtual Visit: COVID-19*  Social distancing based on CDC and AMA recommendations.   I contacted John Hartman on 07/17/2019 via telephone.      I clearly identified myself as Gillis Santa, MD. I verified that I was speaking with the correct person using two identifiers (Name: John Hartman, and date of birth: April 19, 1971).  Advanced Informed Consent I sought verbal advanced consent from John Hartman for virtual visit interactions. I informed John Hartman of possible security and privacy concerns, risks, and limitations associated with providing "not-in-person" medical evaluation and management services. I also informed John Hartman of the availability of "in-person" appointments. Finally, I informed him that there would be a charge for the virtual visit and that he could be  personally, fully or partially, financially responsible for it. John Hartman expressed understanding and agreed to proceed.   Historic Elements   John Hartman is a 48 y.o. year old, male patient evaluated today after his last encounter by our practice on 07/16/2019. John Hartman  has a past medical history of Angioedema, Bronchitis, DDD (degenerative disc  disease), cervical, Diabetes mellitus ([place]), Fatty liver (03/31/2017), GERD (gastroesophageal reflux disease), Gout, Heart attack ([place]), HTN (hypertension), Hypertension, IBS (irritable bowel syndrome), LVH (left ventricular hypertrophy) (11/24/2018), Myocardial infarction ([place]) (08/23/2018), Seasonal allergies, and Spinal stenosis. He also  has a past surgical history that includes Shoulder surgery (Left, 2007); Nasal septum surgery (2004); ACDF with fusion (2007); Testicular torsion (1980s); Vasectomy; Colonoscopy with propofol (N/A, 06/26/2015); Cardiac catheterization (Left, 08/15/2016); Coronary/Graft Acute MI Revascularization (N/A, 08/23/2018); and LEFT HEART CATH AND CORONARY ANGIOGRAPHY (N/A, 08/23/2018). John Hartman has a current medication list which includes the following prescription(s): accu-chek softclix lancets, allopurinol, belbuca, dapagliflozin-metformin hcl er, ezetimibe, melatonin, nitroglycerin, ondansetron, pantoprazole, ranitidine, sucralfate, ticagrelor, tizanidine, atorvastatin, hydralazine, hydrochlorothiazide, insulin degludec, metoprolol tartrate, and pregabalin. He  reports that he quit smoking about 4 years ago. His smoking use included cigarettes. He has a 10.00 pack-year smoking history. He has never used smokeless tobacco. He reports that he does not drink alcohol or use drugs. John Hartman is allergic to lisinopril and ace inhibitors.   HPI  Today, he is being contacted for medication management.   States that he has changed to a plant based diet- has eliminated dairy and meat. States that his overall pain is significant improved and he is utilizing less prn pain medicine. Has gotten off insulin, cholesterol numbers are much better. Dropped 30 lbs. Is sleeping much better.  Pharmacotherapy Assessment  Analgesic:  03/20/2019  2   12/06/2018  Belbuca 450 MCG Film  60.00 30 Bi Lat   [id number]   Nor (4705)   2  0.90 mg  Comm Ins   [place]  01/29/2019  2   12/06/2018  Belbuca 450 MCG Film   60.00  30 Bi Lat   [id number]   Nor (4705)   1  0.90 mg  Comm Ins   [place]    Monitoring: Pharmacotherapy: No side-effects or adverse reactions reported. [place] PMP: PDMP reviewed during this encounter.       Compliance: No problems identified. Effectiveness: Clinically acceptable. Plan: Refer to "POC".  Pertinent Labs   SAFETY SCREENING Profile Lab Results  Component Value Date   MRSAPCR NEGATIVE 08/23/2018   HIV Non Reactive 08/23/2018   Renal Function Lab Results  Component Value Date   BUN 11 03/28/2019   CREATININE 0.85 27/78/2423   BCR NOT APPLICABLE 53/61/4431   GFRAA 122 03/28/2019   GFRNONAA 105 03/28/2019   Hepatic Function Lab Results  Component Value Date   AST 24 03/28/2019   ALT 27 03/28/2019   ALBUMIN 4.9 03/26/2019   UDS Summary  Date Value Ref Range Status  11/13/2018 FINAL  Final    Comment:    ==================================================================== TOXASSURE SELECT 13 (MW) ==================================================================== Test                             Result       Flag       Units   NO DRUGS DETECTED. ==================================================================== Test                      Result    Flag   Units      Ref Range   Creatinine              91               mg/dL      >=20 ==================================================================== Declared Medications:  Medication list was not provided. ==================================================================== For clinical consultation, please call [phone]. ====================================================================    Note: Above Lab results reviewed.  Recent imaging  NM Hepato W/Eject Fract CLINICAL DATA:  Right upper quadrant pain  EXAM: NUCLEAR MEDICINE HEPATOBILIARY IMAGING WITH GALLBLADDER EF  TECHNIQUE: Sequential images of the abdomen were obtained out to 60 minutes following intravenous administration of  radiopharmaceutical. After oral ingestion of Ensure, gallbladder ejection fraction was determined. At 60 min, normal ejection fraction is greater than 33%.  RADIOPHARMACEUTICALS:  5.34 mCi Tc-10m  Choletec IV  COMPARISON:  CT 03/26/2019  FINDINGS: Prompt uptake and biliary excretion of activity by the liver is seen. Gallbladder activity is visualized, consistent with patency of cystic duct. Biliary activity passes into small bowel, consistent with patent common bile duct.  Calculated gallbladder ejection fraction is 33%. (Normal gallbladder ejection fraction with Ensure is greater than 33%.)  IMPRESSION: Normal study.  Electronically Signed   By: Rolm Baptise M.D.   On: 04/05/2019 13:44  Assessment  The primary encounter diagnosis was Chronic pain syndrome. Diagnoses of Lumbar facet arthropathy, Spondylosis of lumbar region without myelopathy or radiculopathy, Lumbar radiculopathy, Spinal stenosis of lumbar region with neurogenic claudication, and Degenerative disc disease, lumbar were also pertinent to this visit.  Plan of Care  I have discontinued John Hartman's glucose blood, Insulin Pen Needle, and glucose blood. I have also changed his Belbuca. Additionally, I am having him start on pregabalin. Lastly, I am having him maintain his ranitidine, Melatonin, allopurinol, insulin degludec, Accu-Chek Softclix Lancets, atorvastatin, nitroGLYCERIN, hydrALAZINE, ticagrelor, Dapagliflozin-metFORMIN HCl ER, ezetimibe, hydrochlorothiazide, metoprolol tartrate, ondansetron, pantoprazole, sucralfate, and tiZANidine.  I had extensive discussion with the patient about the goals of pain management.  We discussed nonpharmacological approaches to pain  management that include physical therapy, dieting, sleep hygiene, psychotherapy, interventional therapy.  We discussed the importance of understanding the type of pain including neuropathic, nociceptive, centralized.  I also stressed the importance of  multimodal analgesia with an emphasis on nondrug modalities including self management, behavioral health support and physical therapy.  We discussed the importance of physical therapy and how a individualized physical therapy and occupational therapy program tailored to patient limitations can be helpful at improving physical function. We also discussed the importance of insomnia and disrupted sleep and how improved sleep hygiene and cognitive therapy could be helpful.  Psychotherapy including CBT, mind-body therapies, pain coping strategies can be helpful for patients whose pain impacts mood, sleep, quality of life, relationships with others.  We discussed avoiding benzodiazepines.  I also had an extensive discussion with the patient about interventional therapies which is my expertise and how these could be incorporated into an effective multimodal pain management plan.   Overall I am very pleased with the patient's progress.  This is truly a success story.  Patient has made lifestyle changes primarily dietary modification and has pretty much resorted to a vegan lifestyle and has noticed significant decrease in his overall pain, improvement in functional status, improvement in sleep.  His last visit with me was in December.  Given his decrease in pain, he has utilized much less pain medication than prescribed.  He is now in need of a refill.  Medications refilled as below.   Pharmacotherapy (Medications Ordered): Meds ordered this encounter  Medications  . BELBUCA 450 MCG FILM    Sig: Place 1 Film inside cheek 2 (two) times daily.    Dispense:  60 each    Refill:  1  . tiZANidine (ZANAFLEX) 4 MG tablet    Sig: Take 1 tablet (4 mg total) by mouth every 8 (eight) hours as needed for muscle spasms.    Dispense:  90 tablet    Refill:  5  . pregabalin (LYRICA) 50 MG capsule    Sig: Take 1 capsule (50 mg total) by mouth 3 (three) times daily.    Dispense:  90 capsule    Refill:  2    Do not place  this medication, or any other prescription from our practice, on "Automatic Refill". Patient may have prescription filled one day early if pharmacy is closed on scheduled refill date.    Follow-up plan:   Return if symptoms worsen or fail to improve.        Recent Visits No visits were found meeting these conditions.  Showing recent visits within past 90 days and meeting all other requirements   Today's Visits Date Type Provider Dept  07/17/19 Office Visit Gillis Santa, MD [facility]  Showing today's visits and meeting all other requirements   Future Appointments No visits were found meeting these conditions.  Showing future appointments within next 90 days and meeting all other requirements   I discussed the assessment and treatment plan with the patient. The patient was provided an opportunity to ask questions and all were answered. The patient agreed with the plan and demonstrated an understanding of the instructions.  Patient advised to call back or seek an in-person evaluation if the symptoms or condition worsens.  Total duration of non-face-to-face encounter:25 minutes.  Note by: Gillis Santa, MD Date: 07/17/2019; Time: 2:51 PM  Note: This dictation was prepared with Dragon dictation. Any transcriptional errors that may result from this process are unintentional.  Disclaimer:  * Given the  special circumstances of the COVID-19 pandemic, the federal government has announced that the Office for Civil Rights (OCR) will exercise its enforcement discretion and will not impose penalties on physicians using telehealth in the event of noncompliance with regulatory requirements under the [place] (HIPAA) in connection with the good faith provision of telehealth during the RPRXY-58 national public health emergency. ([place])

## 2019-09-23 DIAGNOSIS — M75111 Incomplete rotator cuff tear or rupture of right shoulder, not specified as traumatic: Secondary | ICD-10-CM | POA: Insufficient documentation

## 2019-09-23 DIAGNOSIS — M7551 Bursitis of right shoulder: Secondary | ICD-10-CM | POA: Insufficient documentation

## 2019-09-23 DIAGNOSIS — M19011 Primary osteoarthritis, right shoulder: Secondary | ICD-10-CM | POA: Insufficient documentation

## 2019-09-23 DIAGNOSIS — M7520 Bicipital tendinitis, unspecified shoulder: Secondary | ICD-10-CM | POA: Insufficient documentation

## 2019-10-01 HISTORY — PX: SHOULDER ARTHROSCOPY W/ ROTATOR CUFF REPAIR: SHX2400

## 2019-10-03 ENCOUNTER — Ambulatory Visit: Payer: 59 | Attending: Orthopedic Surgery | Admitting: Physical Therapy

## 2019-10-03 ENCOUNTER — Other Ambulatory Visit: Payer: Self-pay

## 2019-10-03 DIAGNOSIS — M75121 Complete rotator cuff tear or rupture of right shoulder, not specified as traumatic: Secondary | ICD-10-CM | POA: Diagnosis not present

## 2019-10-03 DIAGNOSIS — M6281 Muscle weakness (generalized): Secondary | ICD-10-CM | POA: Diagnosis present

## 2019-10-03 DIAGNOSIS — M25511 Pain in right shoulder: Secondary | ICD-10-CM | POA: Diagnosis present

## 2019-10-03 DIAGNOSIS — M25611 Stiffness of right shoulder, not elsewhere classified: Secondary | ICD-10-CM

## 2019-10-03 DIAGNOSIS — G8929 Other chronic pain: Secondary | ICD-10-CM | POA: Diagnosis present

## 2019-10-04 NOTE — Therapy (Addendum)
[facility] [address] [place], [place], [address] Phone: [phone]   Fax:  [phone]  Physical Therapy Evaluation  Patient Details  Name: John Hartman MRN: [id number] Date of Birth: [birth year] Referring Provider (PT): Laure Kidney, MD   Encounter Date: 10/03/2019  PT End of Session - 10/14/19 0737    Visit Number  1    Number of Visits  8    Date for PT Re-Evaluation  10/31/19    Authorization - Visit Number  1    Authorization - Number of Visits  10    PT Start Time  1644    PT Stop Time  1751    PT Time Calculation (min)  67 min    Activity Tolerance  Patient tolerated treatment well;Patient limited by pain    Behavior During Therapy  [facility] for tasks assessed/performed       Past Medical History:  Diagnosis Date  . Angioedema   . Bronchitis   . DDD (degenerative disc disease), cervical   . Diabetes mellitus ([place])   . Fatty liver 03/31/2017   [place] April 2018  . GERD (gastroesophageal reflux disease)   . Gout   . Heart attack ([place])   . HTN (hypertension)   . Hypertension    CONTROLLED ON MEDS  . IBS (irritable bowel syndrome)   . LVH (left ventricular hypertrophy) 11/24/2018   Moderate, ECHO  . Myocardial infarction ([place]) 08/23/2018  . Seasonal allergies   . Spinal stenosis     Past Surgical History:  Procedure Laterality Date  . ACDF with fusion  2007  . CARDIAC CATHETERIZATION Left 08/15/2016   Procedure: Left Heart Cath and Coronary Angiography;  Surgeon: Wellington Hampshire, MD;  Location: [place] [facility];  Service: Cardiovascular;  Laterality: Left;  . COLONOSCOPY WITH PROPOFOL N/A 06/26/2015   Procedure: COLONOSCOPY WITH PROPOFOL;  Surgeon: Lucilla Lame, MD;  Location: [facility];  Service: Endoscopy;  Laterality: N/A;  with biopsies  . CORONARY/GRAFT ACUTE MI REVASCULARIZATION N/A 08/23/2018   Procedure: Coronary/Graft Acute MI Revascularization;  Surgeon: Isaias Cowman, MD;  Location: [place] [facility];  Service: Cardiovascular;  Laterality: N/A;  . LEFT HEART CATH AND CORONARY ANGIOGRAPHY N/A 08/23/2018   Procedure: LEFT HEART CATH AND CORONARY ANGIOGRAPHY;  Surgeon: Isaias Cowman, MD;  Location: [place] [facility];  Service: Cardiovascular;  Laterality: N/A;  . NASAL SEPTUM SURGERY  2004  . SHOULDER SURGERY Left 2007  . Testicular torsion  1980s  . VASECTOMY      There were no vitals filed for this visit.   Subjective Assessment - 10/14/19 0722    Subjective  Pt. arrived to PT in sling with c/o 3/10 R shoulder pain currently, 10/10 at worst (pt. feels nerve block has already worn off), <2/10 at best (ice).  Pt. states he took 2 Percocet prior to PT evaluation today.  Pt. states he had a complete RTC tear and reports surgery was more extensive then initially thought.  Pt. arrived with MD protocol.    Patient is accompained by:  Family member    Pertinent History  pt. known to PT.  Pt. is planning on lumbar fusion once he has completely recovered from shoulder.  Pt. has adhesive allergy from tape.    Limitations  House hold activities;Lifting    Patient Stated Goals  Increase R shoulder AROM/ strength/ pain-free mobility.    Currently in Pain?  Yes    Pain Score  3  Pain Location  Shoulder    Pain Orientation  Right    Pain Descriptors / Indicators  Constant;Aching;Discomfort;Sharp;Tightness    Pain Type  Acute pain         [facility] PT Assessment - 10/14/19 0001      Assessment   Medical Diagnosis  Incomplete tear of right rotator cuff, Bursitis of right shoulder, Arthritis of right acromioclavicular joint, Biceps tendonitis    Referring Provider (PT)  Laure Kidney, MD    Onset Date/Surgical Date  10/01/19    Hand Dominance  Right    Next MD Visit  --   10/14/2019   Prior Therapy  known to PT      Precautions   Precautions  Shoulder    Type of Shoulder Precautions  --   RTC (see MD protocol)     [place]  residence      Prior Function   Level of Independence  Independent      Cognition   Overall Cognitive Status  Within Functional Limits for tasks assessed        Posture:  Rounded shoulder/ forward head posture in sitting  Pt. Requires min. Assist to don/doff sling  R shoulder PROM in supine:  Flexion 88 deg., abduction 44 deg., IR 55 deg., ER -4 deg. No AROM or strength testing of R shoulder     Objective measurements completed on examination: See above findings.     Supine R shoulder PROM (all planes): limited to 90 deg. Flexion/ <45 deg. Abduction/ 55 deg. Of IR and no ER (pain limited).   PT instructed pt. On pendulums/ icing.      PT Education - 10/14/19 0736    Education provided  Yes    Education Details  Pendulum/ elbow and wrist AA/PROM/ ball squeezes.    Person(s) Educated  Patient;Spouse    Methods  Explanation;Demonstration;Handout    Comprehension  Verbalized understanding;Returned demonstration          PT Long Term Goals - 10/14/19 0754      PT LONG TERM GOAL #1   Title  Pt. I with HEP to increase R shoulder PROM to [facility] as compared to L to progress to AROM/ MD protocol.    Baseline  Supine R shoulder PROM: flexion 88 deg., abduction 44 deg., IR 55 deg., ER -4 deg.    Time  4    Period  Weeks    Status  New    Target Date  10/31/19      PT LONG TERM GOAL #2   Title  Pt. will complete FOTO and score norm values to improve R shoulder functional mobility.    Baseline  TBD    Time  4    Period  Weeks    Status  New    Target Date  10/31/19      PT LONG TERM GOAL #3   Title  Pt. will complete grip strength testing and score >10% of L to improve grasping/ strengthening.    Baseline  TBD    Time  4    Period  Weeks    Status  New    Target Date  10/31/19      PT LONG TERM GOAL #4   Title  Pt. will start R shoulder AROM and demonstrate >90 deg. flexion/ abduction to improve progression of HEP/ daily tasks.    Baseline  No AROM at this time.     Time  4    Period  Weeks    Status  New    Target Date  10/31/19         Plan - 10/14/19 [id number]    Clinical Impression Statement  Pt. is a pleasant 48 y/o male s/p R RTC surgery (see protocol) and arrives to PT with use of sling.  Pt. reports 3/10 R shoulder pain currently at rest, 10/10 at worst and <2/10 at best.  Pt. presents with moderate swelling and start of brusing to R shoulder/ axillary region.  Bandages in place and significant redness noted from allergic reaction to bandages/ tape.  PT reviewed MD protocol and importance of no AROM at this time.  Supine L shouler AROM WNL and strength grossly 5/5 MMT.  Supine R shoulder PROM: flexion 88 deg., abduction 44 deg., IR 55 deg., ER -4 deg.  R elbow PROM WFL (flexion/ extension/ supination/ pronation).  No strength testing at this time.  Pt. will benefit  from skilled PT services to increase upright posture/ R shoulder ROM and strength to [facility] to improve pain-free mobility.    Stability/Clinical Decision Making  Evolving/Moderate complexity    Clinical Decision Making  Moderate    Rehab Potential  Good    PT Frequency  2x / week    PT Duration  4 weeks    PT Treatment/Interventions  ADLs/Self Care Home Management;Aquatic Therapy;Cryotherapy;Electrical Stimulation;Traction;Dry needling;Passive range of motion;Manual techniques;Neuromuscular re-education;Patient/family education;Therapeutic exercise;Therapeutic activities;Functional mobility training;Gait training    PT Next Visit Plan  R shoulder PROM/ see MD protoocol    Consulted and Agree with Plan of Care  Patient       Patient will benefit from skilled therapeutic intervention in order to improve the following deficits and impairments:  Pain, Improper body mechanics, Postural dysfunction, Decreased activity tolerance, Decreased endurance, Decreased range of motion, Decreased strength, Hypomobility, Difficulty walking, Impaired flexibility, Impaired UE functional use  Visit  Diagnosis: Nontraumatic complete tear of right rotator cuff  Shoulder joint stiffness, right  Muscle weakness (generalized)  Chronic right shoulder pain     Problem List Patient Active Problem List   Diagnosis Date Noted  . 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 Tachycardia 03/24/2019  . Status post myocardial infarction 11/24/2018  . LVH (left ventricular hypertrophy) 11/24/2018  . 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 NSTEMI (non-ST elevated myocardial infarction) ([place]) 08/22/2018  . Elevated hemoglobin ([place]) 08/11/2017  . Chronic lower back pain 08/04/2017  . Degenerative disc disease, lumbar 08/04/2017  . 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 Calcification of abdominal aorta (HCC) 08/04/2017  . Fatty liver 03/31/2017  . Elevated liver enzymes 02/11/2017  . Serum total bilirubin elevated 02/11/2017  . Hyperlipidemia LDL goal <70 01/04/2017  . Elevated serum glutamic pyruvic transaminase (SGPT) level 01/04/2017  . Angioedema   . Encounter for medication monitoring 08/25/2016  . Coronary artery disease 08/25/2016  . Uncontrolled diabetes mellitus with hyperglycemia ([place])   . Chest pain 08/08/2016  . Preventative health care 08/02/2016  . Ketonuria 07/12/2016  . Glucosuria 07/12/2016  . 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 Palpitations 08/12/2015  . Family history of malignant neoplasm of gastrointestinal tract   . Benign neoplasm of rectosigmoid junction   . Essential hypertension 05/28/2015  . Gout 05/28/2015  . IBS (irritable bowel syndrome) 05/28/2015   Pura Spice, PT, DPT # [phone] 10/14/2019, 8:02 AM  [facility] [address] [place], [place], [address] Phone: [phone]   Fax:  [phone]  Name:  John Hartman MRN: [id number] Date of Birth: Oct 23, 1971

## 2019-10-08 ENCOUNTER — Other Ambulatory Visit: Payer: Self-pay

## 2019-10-08 ENCOUNTER — Ambulatory Visit: Payer: 59 | Admitting: Physical Therapy

## 2019-10-08 DIAGNOSIS — M75121 Complete rotator cuff tear or rupture of right shoulder, not specified as traumatic: Secondary | ICD-10-CM

## 2019-10-08 DIAGNOSIS — G8929 Other chronic pain: Secondary | ICD-10-CM

## 2019-10-08 DIAGNOSIS — M6281 Muscle weakness (generalized): Secondary | ICD-10-CM

## 2019-10-08 DIAGNOSIS — M25611 Stiffness of right shoulder, not elsewhere classified: Secondary | ICD-10-CM

## 2019-10-10 ENCOUNTER — Other Ambulatory Visit: Payer: Self-pay

## 2019-10-10 ENCOUNTER — Ambulatory Visit: Payer: 59 | Admitting: Physical Therapy

## 2019-10-10 DIAGNOSIS — M25511 Pain in right shoulder: Secondary | ICD-10-CM

## 2019-10-10 DIAGNOSIS — M75121 Complete rotator cuff tear or rupture of right shoulder, not specified as traumatic: Secondary | ICD-10-CM

## 2019-10-10 DIAGNOSIS — M6281 Muscle weakness (generalized): Secondary | ICD-10-CM

## 2019-10-10 DIAGNOSIS — M25611 Stiffness of right shoulder, not elsewhere classified: Secondary | ICD-10-CM

## 2019-10-10 DIAGNOSIS — G8929 Other chronic pain: Secondary | ICD-10-CM

## 2019-10-11 NOTE — Therapy (Addendum)
[facility] [address] [place], [place], [address] Phone: [phone]   Fax:  [phone]  Physical Therapy Treatment  Patient Details  Name: John Hartman MRN: [id number] Date of Birth: 1971/08/16 Referring Provider (PT): Laure Kidney, MD   Encounter Date: 10/08/2019  PT End of Session - 10/17/19 0743    Visit Number  2    Number of Visits  8    Date for PT Re-Evaluation  10/31/19    Authorization - Visit Number  2    Authorization - Number of Visits  10    PT Start Time  [id number]    PT Stop Time  [id number]    PT Time Calculation (min)  58 min    Activity Tolerance  Patient tolerated treatment well;Patient limited by pain    Behavior During Therapy  [facility] for tasks assessed/performed       Past Medical History:  Diagnosis Date  . Angioedema   . Bronchitis   . DDD (degenerative disc disease), cervical   . Diabetes mellitus ([place])   . Fatty liver 03/31/2017   [place] April 2018  . GERD (gastroesophageal reflux disease)   . Gout   . Heart attack ([place])   . HTN (hypertension)   . Hypertension    CONTROLLED ON MEDS  . IBS (irritable bowel syndrome)   . LVH (left ventricular hypertrophy) 11/24/2018   Moderate, ECHO  . Myocardial infarction ([place]) 08/23/2018  . Seasonal allergies   . Spinal stenosis     Past Surgical History:  Procedure Laterality Date  . ACDF with fusion  2007  . CARDIAC CATHETERIZATION Left 08/15/2016   Procedure: Left Heart Cath and Coronary Angiography;  Surgeon: Wellington Hampshire, MD;  Location: [place] [facility];  Service: Cardiovascular;  Laterality: Left;  . COLONOSCOPY WITH PROPOFOL N/A 06/26/2015   Procedure: COLONOSCOPY WITH PROPOFOL;  Surgeon: Lucilla Lame, MD;  Location: [facility];  Service: Endoscopy;  Laterality: N/A;  with biopsies  . CORONARY/GRAFT ACUTE MI REVASCULARIZATION N/A 08/23/2018   Procedure: Coronary/Graft Acute MI Revascularization;  Surgeon: Isaias Cowman, MD;  Location: [place] [facility];  Service: Cardiovascular;  Laterality: N/A;  . LEFT HEART CATH AND CORONARY ANGIOGRAPHY N/A 08/23/2018   Procedure: LEFT HEART CATH AND CORONARY ANGIOGRAPHY;  Surgeon: Isaias Cowman, MD;  Location: [facility];  Service: Cardiovascular;  Laterality: N/A;  . NASAL SEPTUM SURGERY  2004  . SHOULDER SURGERY Left 2007  . Testicular torsion  1980s  . VASECTOMY      There were no vitals filed for this visit.  Subjective Assessment - 10/17/19 0738    Subjective  Pt. states R shoulder pain has been really difficult over past couple of days.  Pt. reports 10/10 at worst, <2/10 at best (ice).  Pt. states he took his pain meds prior to PT tx. session.    Patient is accompained by:  Family member    Pertinent History  pt. known to PT.  Pt. is planning on lumbar fusion once he has completely recovered from shoulder.  Pt. has adhesive allergy from tape.    Limitations  House hold activities;Lifting    Patient Stated Goals  Increase R shoulder AROM/ strength/ pain-free mobility.    Currently in Pain?  Yes    Pain Score  5     Pain Location  Shoulder    Pain Orientation  Right    Pain Descriptors / Indicators  Aching;Constant;Sharp    Pain  Type  Surgical pain;Acute pain          Manual tx.:  Reviewed HEP Supine R shoulder PROM (all planes) 10x each with static holds as tolerated (gentle oscillations to decrease guarding) R elbow A/AROM flexion/ extension/ supination/ pronation STM to L UT/ biceps/ posterior deltoid  Ice to R shoulder after tx. Session.  10 min.     PT Long Term Goals - 10/14/19 0754      PT LONG TERM GOAL #1   Title  Pt. I with HEP to increase R shoulder PROM to [facility] as compared to L to progress to AROM/ MD protocol.    Baseline  Supine R shoulder PROM: flexion 88 deg., abduction 44 deg., IR 55 deg., ER -4 deg.    Time  4    Period  Weeks    Status  New    Target Date  10/31/19      PT LONG TERM GOAL #2   Title  Pt. will complete FOTO  and score norm values to improve R shoulder functional mobility.    Baseline  TBD    Time  4    Period  Weeks    Status  New    Target Date  10/31/19      PT LONG TERM GOAL #3   Title  Pt. will complete grip strength testing and score >10% of L to improve grasping/ strengthening.    Baseline  TBD    Time  4    Period  Weeks    Status  New    Target Date  10/31/19      PT LONG TERM GOAL #4   Title  Pt. will start R shoulder AROM and demonstrate >90 deg. flexion/ abduction to improve progression of HEP/ daily tasks.    Baseline  No AROM at this time.    Time  4    Period  Weeks    Status  New    Target Date  10/31/19         Plan - 10/17/19 0744    Clinical Impression Statement  Pt. remains pain limited/ guarded with R shoulder PROM in supine position.  Pt. able to manage approx. 90 deg. R shoulder flexion, 45 deg. abduction and 0 deg. ER.  Good R elbow flexion/ supination AROM in midline position.  PT did not issue any new ther.ex. today and wants pt. to continue use of sling and ice.  Significant R shoulder/biceps ecchymosis and swelling.    Stability/Clinical Decision Making  Evolving/Moderate complexity    Clinical Decision Making  Moderate    Rehab Potential  Good    PT Frequency  2x / week    PT Duration  4 weeks    PT Treatment/Interventions  ADLs/Self Care Home Management;Aquatic Therapy;Cryotherapy;Electrical Stimulation;Traction;Dry needling;Passive range of motion;Manual techniques;Neuromuscular re-education;Patient/family education;Therapeutic exercise;Therapeutic activities;Functional mobility training;Gait training    PT Next Visit Plan  R shoulder PROM/ see MD protoocol    Consulted and Agree with Plan of Care  Patient       Patient will benefit from skilled therapeutic intervention in order to improve the following deficits and impairments:  Pain, Improper body mechanics, Postural dysfunction, Decreased activity tolerance, Decreased endurance, Decreased range of  motion, Decreased strength, Hypomobility, Difficulty walking, Impaired flexibility, Impaired UE functional use  Visit Diagnosis: Nontraumatic complete tear of right rotator cuff  Shoulder joint stiffness, right  Muscle weakness (generalized)  Chronic right shoulder pain     Problem List Patient Active  Problem List   Diagnosis Date Noted  . 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 Tachycardia 03/24/2019  . Status post myocardial infarction 11/24/2018  . LVH (left ventricular hypertrophy) 11/24/2018  . 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 NSTEMI (non-ST elevated myocardial infarction) ([place]) 08/22/2018  . Elevated hemoglobin ([place]) 08/11/2017  . Chronic lower back pain 08/04/2017  . Degenerative disc disease, lumbar 08/04/2017  . 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 Calcification of abdominal aorta (HCC) 08/04/2017  . Fatty liver 03/31/2017  . Elevated liver enzymes 02/11/2017  . Serum total bilirubin elevated 02/11/2017  . Hyperlipidemia LDL goal <70 01/04/2017  . Elevated serum glutamic pyruvic transaminase (SGPT) level 01/04/2017  . Angioedema   . Encounter for medication monitoring 08/25/2016  . Coronary artery disease 08/25/2016  . Uncontrolled diabetes mellitus with hyperglycemia ([place])   . Chest pain 08/08/2016  . Preventative health care 08/02/2016  . Ketonuria 07/12/2016  . Glucosuria 07/12/2016  . 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 Palpitations 08/12/2015  . Family history of malignant neoplasm of gastrointestinal tract   . Benign neoplasm of rectosigmoid junction   . Essential hypertension 05/28/2015  . Gout 05/28/2015  . IBS (irritable bowel syndrome) 05/28/2015   Pura Spice, PT, DPT # [phone] 10/17/2019, 7:47 AM  [facility] [address] [place], [place], [address] Phone: [phone]   Fax:  [phone]  Name: John Hartman MRN: [id number] Date of Birth: [birth year]

## 2019-10-12 NOTE — Therapy (Addendum)
[facility] [address]. [place], [place], [address] Phone: [phone]   Fax:  [phone]  Physical Therapy Treatment  Patient Details  Name: John Hartman MRN: [id number] Date of Birth: 1971/05/25 Referring Provider (PT): Laure Kidney, MD   Encounter Date: 10/10/2019  PT End of Session - 10/17/19 0917    Visit Number  3    Number of Visits  8    Date for PT Re-Evaluation  10/31/19    Authorization - Visit Number  3    Authorization - Number of Visits  10    PT Start Time  [id number]    PT Stop Time  1742    PT Time Calculation (min)  57 min    Activity Tolerance  Patient tolerated treatment well;Patient limited by pain    Behavior During Therapy  [facility] for tasks assessed/performed       Past Medical History:  Diagnosis Date  . Angioedema   . Bronchitis   . DDD (degenerative disc disease), cervical   . Diabetes mellitus ([place])   . Fatty liver 03/31/2017   [place] April 2018  . GERD (gastroesophageal reflux disease)   . Gout   . Heart attack ([place])   . HTN (hypertension)   . Hypertension    CONTROLLED ON MEDS  . IBS (irritable bowel syndrome)   . LVH (left ventricular hypertrophy) 11/24/2018   Moderate, ECHO  . Myocardial infarction ([place]) 08/23/2018  . Seasonal allergies   . Spinal stenosis     Past Surgical History:  Procedure Laterality Date  . ACDF with fusion  2007  . CARDIAC CATHETERIZATION Left 08/15/2016   Procedure: Left Heart Cath and Coronary Angiography;  Surgeon: Wellington Hampshire, MD;  Location: [place] [facility];  Service: Cardiovascular;  Laterality: Left;  . COLONOSCOPY WITH PROPOFOL N/A 06/26/2015   Procedure: COLONOSCOPY WITH PROPOFOL;  Surgeon: Lucilla Lame, MD;  Location: [facility];  Service: Endoscopy;  Laterality: N/A;  with biopsies  . CORONARY/GRAFT ACUTE MI REVASCULARIZATION N/A 08/23/2018   Procedure: Coronary/Graft Acute MI Revascularization;  Surgeon: Isaias Cowman, MD;  Location: [place] [facility];  Service: Cardiovascular;  Laterality: N/A;  . LEFT HEART CATH AND CORONARY ANGIOGRAPHY N/A 08/23/2018   Procedure: LEFT HEART CATH AND CORONARY ANGIOGRAPHY;  Surgeon: Isaias Cowman, MD;  Location: [place] [facility];  Service: Cardiovascular;  Laterality: N/A;  . NASAL SEPTUM SURGERY  2004  . SHOULDER SURGERY Left 2007  . Testicular torsion  1980s  . VASECTOMY      There were no vitals filed for this visit.  Subjective Assessment - 10/17/19 0754    Subjective  Pt. reports R shoulder pain has been difficult.  Pt. benefits from ice.  Pt. is experiencing sharp R pain several times a day (10/10 pain).    Patient is accompained by:  Family member    Pertinent History  pt. known to PT.  Pt. is planning on lumbar fusion once he has completely recovered from shoulder.  Pt. has adhesive allergy from tape.    Limitations  House hold activities;Lifting    Patient Stated Goals  Increase R shoulder AROM/ strength/ pain-free mobility.    Currently in Pain?  Yes    Pain Score  3     Pain Location  Shoulder    Pain Orientation  Right    Pain Descriptors / Indicators  Aching;Constant          Manual tx.:  STM to L  UT/ biceps/ posterior deltoid Supine R shoulder PROM (all planes) 10x2 each with static holds as tolerated (gentle oscillations to decrease guarding) R elbow A/AROM flexion/ extension/ supination/ pronation   Ice to R shoulder after tx. Session.  10 min.      PT Long Term Goals - 10/14/19 0754      PT LONG TERM GOAL #1   Title  Pt. I with HEP to increase R shoulder PROM to [facility] as compared to L to progress to AROM/ MD protocol.    Baseline  Supine R shoulder PROM: flexion 88 deg., abduction 44 deg., IR 55 deg., ER -4 deg.    Time  4    Period  Weeks    Status  New    Target Date  10/31/19      PT LONG TERM GOAL #2   Title  Pt. will complete FOTO and score norm values to improve R shoulder functional mobility.    Baseline  TBD    Time  4     Period  Weeks    Status  New    Target Date  10/31/19      PT LONG TERM GOAL #3   Title  Pt. will complete grip strength testing and score >10% of L to improve grasping/ strengthening.    Baseline  TBD    Time  4    Period  Weeks    Status  New    Target Date  10/31/19      PT LONG TERM GOAL #4   Title  Pt. will start R shoulder AROM and demonstrate >90 deg. flexion/ abduction to improve progression of HEP/ daily tasks.    Baseline  No AROM at this time.    Time  4    Period  Weeks    Status  New    Target Date  10/31/19            Plan - 10/17/19 [id number]    Clinical Impression Statement  Pt. very guarded with initial R shoulder flexion/ abduction PROM in supine position.  Pt. did increase R shoulder PROM as tx. progressed and pt. became less tense/ pain guarded.  Increase in R shoulder/ UE swelling and bruising to proximal forearm.  Pt. educated in proper R UE positioning/ use of ice to manage inflammation and pain.  No change to HEP.  Pt. returns to MD next week prior to next PT appt.    Stability/Clinical Decision Making  Evolving/Moderate complexity    Clinical Decision Making  Moderate    Rehab Potential  Good    PT Frequency  2x / week    PT Duration  4 weeks    PT Treatment/Interventions  ADLs/Self Care Home Management;Aquatic Therapy;Cryotherapy;Electrical Stimulation;Traction;Dry needling;Passive range of motion;Manual techniques;Neuromuscular re-education;Patient/family education;Therapeutic exercise;Therapeutic activities;Functional mobility training;Gait training    PT Next Visit Plan  R shoulder PROM/ see MD protoocol    Consulted and Agree with Plan of Care  Patient       Patient will benefit from skilled therapeutic intervention in order to improve the following deficits and impairments:  Pain, Improper body mechanics, Postural dysfunction, Decreased activity tolerance, Decreased endurance, Decreased range of motion, Decreased strength, Hypomobility, Difficulty  walking, Impaired flexibility, Impaired UE functional use  Visit Diagnosis: Nontraumatic complete tear of right rotator cuff  Shoulder joint stiffness, right  Muscle weakness (generalized)  Chronic right shoulder pain     Problem List Patient Active Problem List   Diagnosis Date Noted  .  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 Tachycardia 03/24/2019  . Status post myocardial infarction 11/24/2018  . LVH (left ventricular hypertrophy) 11/24/2018  . 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 NSTEMI (non-ST elevated myocardial infarction) ([place]) 08/22/2018  . Elevated hemoglobin ([place]) 08/11/2017  . Chronic lower back pain 08/04/2017  . Degenerative disc disease, lumbar 08/04/2017  . 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 Calcification of abdominal aorta (HCC) 08/04/2017  . Fatty liver 03/31/2017  . Elevated liver enzymes 02/11/2017  . Serum total bilirubin elevated 02/11/2017  . Hyperlipidemia LDL goal <70 01/04/2017  . Elevated serum glutamic pyruvic transaminase (SGPT) level 01/04/2017  . Angioedema   . Encounter for medication monitoring 08/25/2016  . Coronary artery disease 08/25/2016  . Uncontrolled diabetes mellitus with hyperglycemia ([place])   . Chest pain 08/08/2016  . Preventative health care 08/02/2016  . Ketonuria 07/12/2016  . Glucosuria 07/12/2016  . 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 Palpitations 08/12/2015  . Family history of malignant neoplasm of gastrointestinal tract   . Benign neoplasm of rectosigmoid junction   . Essential hypertension 05/28/2015  . Gout 05/28/2015  . IBS (irritable bowel syndrome) 05/28/2015   Pura Spice, PT, DPT # [phone] 10/17/2019, 10:27 AM  [facility] [address] [place], [place], [address] Phone: [phone]   Fax:  [phone]  Name: Gionni Villar MRN: [id number] Date of Birth: October 20, 1971

## 2019-10-14 ENCOUNTER — Encounter: Payer: Self-pay | Admitting: Physical Therapy

## 2019-10-14 NOTE — Addendum Note (Signed)
Addended by: Pura Spice on: 10/14/2019 08:12 AM   Modules accepted: Orders

## 2019-10-15 ENCOUNTER — Other Ambulatory Visit: Payer: Self-pay

## 2019-10-15 ENCOUNTER — Ambulatory Visit: Payer: 59 | Admitting: Physical Therapy

## 2019-10-15 DIAGNOSIS — G8929 Other chronic pain: Secondary | ICD-10-CM

## 2019-10-15 DIAGNOSIS — M75121 Complete rotator cuff tear or rupture of right shoulder, not specified as traumatic: Secondary | ICD-10-CM

## 2019-10-15 DIAGNOSIS — M25511 Pain in right shoulder: Secondary | ICD-10-CM

## 2019-10-15 DIAGNOSIS — M25611 Stiffness of right shoulder, not elsewhere classified: Secondary | ICD-10-CM

## 2019-10-15 DIAGNOSIS — M6281 Muscle weakness (generalized): Secondary | ICD-10-CM

## 2019-10-17 ENCOUNTER — Encounter: Payer: Self-pay | Admitting: Physical Therapy

## 2019-10-17 ENCOUNTER — Ambulatory Visit: Payer: 59

## 2019-10-17 ENCOUNTER — Other Ambulatory Visit: Payer: Self-pay

## 2019-10-17 DIAGNOSIS — M6281 Muscle weakness (generalized): Secondary | ICD-10-CM

## 2019-10-17 DIAGNOSIS — M25611 Stiffness of right shoulder, not elsewhere classified: Secondary | ICD-10-CM

## 2019-10-17 DIAGNOSIS — M25511 Pain in right shoulder: Secondary | ICD-10-CM

## 2019-10-17 DIAGNOSIS — G8929 Other chronic pain: Secondary | ICD-10-CM

## 2019-10-17 DIAGNOSIS — M75121 Complete rotator cuff tear or rupture of right shoulder, not specified as traumatic: Secondary | ICD-10-CM | POA: Diagnosis not present

## 2019-10-17 NOTE — Therapy (Signed)
[facility] [address]. [place], [place], [address] Phone: [phone]   Fax:  [phone]  Physical Therapy Treatment  Patient Details  Name: John Hartman MRN: [id number] Date of Birth: 1971/06/29 Referring Provider (PT): Laure Kidney, MD   Encounter Date: 10/17/2019  PT End of Session - 10/18/19 1225    Visit Number  5    Number of Visits  8    Date for PT Re-Evaluation  10/31/19    Authorization - Visit Number  5    Authorization - Number of Visits  10    PT Start Time  [id number]    PT Stop Time  1724    PT Time Calculation (min)  41 min    Activity Tolerance  Patient tolerated treatment well    Behavior During Therapy  [facility] for tasks assessed/performed       Past Medical History:  Diagnosis Date  . Angioedema   . Bronchitis   . DDD (degenerative disc disease), cervical   . Diabetes mellitus ([place])   . Fatty liver 03/31/2017   [place] April 2018  . GERD (gastroesophageal reflux disease)   . Gout   . Heart attack ([place])   . HTN (hypertension)   . Hypertension    CONTROLLED ON MEDS  . IBS (irritable bowel syndrome)   . LVH (left ventricular hypertrophy) 11/24/2018   Moderate, ECHO  . Myocardial infarction ([place]) 08/23/2018  . Seasonal allergies   . Spinal stenosis     Past Surgical History:  Procedure Laterality Date  . ACDF with fusion  2007  . CARDIAC CATHETERIZATION Left 08/15/2016   Procedure: Left Heart Cath and Coronary Angiography;  Surgeon: Wellington Hampshire, MD;  Location: [place] [facility];  Service: Cardiovascular;  Laterality: Left;  . COLONOSCOPY WITH PROPOFOL N/A 06/26/2015   Procedure: COLONOSCOPY WITH PROPOFOL;  Surgeon: Lucilla Lame, MD;  Location: [place] [facility];  Service: Endoscopy;  Laterality: N/A;  with biopsies  . CORONARY/GRAFT ACUTE MI REVASCULARIZATION N/A 08/23/2018   Procedure: Coronary/Graft Acute MI Revascularization;  Surgeon: Isaias Cowman, MD;  Location: [place] [facility];  Service:  Cardiovascular;  Laterality: N/A;  . LEFT HEART CATH AND CORONARY ANGIOGRAPHY N/A 08/23/2018   Procedure: LEFT HEART CATH AND CORONARY ANGIOGRAPHY;  Surgeon: Isaias Cowman, MD;  Location: [place] [facility];  Service: Cardiovascular;  Laterality: N/A;  . NASAL SEPTUM SURGERY  2004  . SHOULDER SURGERY Left 2007  . Testicular torsion  1980s  . VASECTOMY      There were no vitals filed for this visit.  Subjective Assessment - 10/18/19 1222    Subjective  Patient reported that he was sore after the last PT session, and then the next day his pain was better. He did say he increased his pain after doffing sling for too long after his shower, but overall feels his pain is improving.    Patient is accompained by:  Family member    Pertinent History  pt. known to PT.  Pt. is planning on lumbar fusion once he has completely recovered from shoulder.  Pt. has adhesive allergy from tape.    Limitations  House hold activities;Lifting    Patient Stated Goals  Increase R shoulder AROM/ strength/ pain-free mobility.    Currently in Pain?  Yes    Pain Score  3     Pain Location  Shoulder    Pain Orientation  Right    Pain Descriptors / Indicators  Aching  Pain Type  Acute pain       TREATMENT:   Manual tx.:  Seated STM to R UT/ posterior deltoid.  Assessment of R shoulder incisions/ steristrips (healing well).  Decrease in swelling/ bruising as compared to last week.  Therapeutic exercise: Supine R shoulder PROM: flexion 10x2, abduction 10x2, IR/ER 15x (to pain tolerable range with short static holds)- gentle oscillations with initial PROM to decrease guarding.  Seated R scapular protraction 2x10  Pulley flexion/scaption 2x10 after demo, pt able to maintain PROM precautions with movements  Pt. Will ice at home.     PT Education - 10/17/19 1655    Education provided  Yes    Education Details  Pt educated on AAROM pulley flexion/ abduction    Person(s) Educated   Patient;Spouse    Methods  Explanation;Demonstration;Handout    Comprehension  Verbalized understanding;Returned demonstration          PT Long Term Goals - 10/14/19 0754      PT LONG TERM GOAL #1   Title  Pt. I with HEP to increase R shoulder PROM to [facility] as compared to L to progress to AROM/ MD protocol.    Baseline  Supine R shoulder PROM: flexion 88 deg., abduction 44 deg., IR 55 deg., ER -4 deg.    Time  4    Period  Weeks    Status  New    Target Date  10/31/19      PT LONG TERM GOAL #2   Title  Pt. will complete FOTO and score norm values to improve R shoulder functional mobility.    Baseline  TBD    Time  4    Period  Weeks    Status  New    Target Date  10/31/19      PT LONG TERM GOAL #3   Title  Pt. will complete grip strength testing and score >10% of L to improve grasping/ strengthening.    Baseline  TBD    Time  4    Period  Weeks    Status  New    Target Date  10/31/19      PT LONG TERM GOAL #4   Title  Pt. will start R shoulder AROM and demonstrate >90 deg. flexion/ abduction to improve progression of HEP/ daily tasks.    Baseline  No AROM at this time.    Time  4    Period  Weeks    Status  New    Target Date  10/31/19            Plan - 10/18/19 1223    Clinical Impression Statement  The patient continues to show progression with PROM. Able to improve PROM flexion to 136, abduction to ~90deg, ER most limited today at 20deg. The patient demonstrated understanding and proper technique/form with pulley use. Will formally send pulleys home with pt/pt will acquire pulleys in preparation for HEP.    Stability/Clinical Decision Making  Evolving/Moderate complexity    Rehab Potential  Good    PT Frequency  2x / week    PT Duration  4 weeks    PT Treatment/Interventions  ADLs/Self Care Home Management;Aquatic Therapy;Cryotherapy;Electrical Stimulation;Traction;Dry needling;Passive range of motion;Manual techniques;Neuromuscular re-education;Patient/family  education;Therapeutic exercise;Therapeutic activities;Functional mobility training;Gait training    PT Next Visit Plan  R shoulder PROM/ see MD protoocol.  Add pulley R shoulder PROM (flexion/ abduction) to HEP next tx. session.    Consulted and Agree with Plan of Care  Patient  Patient will benefit from skilled therapeutic intervention in order to improve the following deficits and impairments:  Pain, Improper body mechanics, Postural dysfunction, Decreased activity tolerance, Decreased endurance, Decreased range of motion, Decreased strength, Hypomobility, Difficulty walking, Impaired flexibility, Impaired UE functional use  Visit Diagnosis: Nontraumatic complete tear of right rotator cuff  Shoulder joint stiffness, right  Muscle weakness (generalized)  Chronic right shoulder pain     Problem List Patient Active Problem List   Diagnosis Date Noted  . 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 Tachycardia 03/24/2019  . Status post myocardial infarction 11/24/2018  . LVH (left ventricular hypertrophy) 11/24/2018  . 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 NSTEMI (non-ST elevated myocardial infarction) ([place]) 08/22/2018  . Elevated hemoglobin ([place]) 08/11/2017  . Chronic lower back pain 08/04/2017  . Degenerative disc disease, lumbar 08/04/2017  . 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 Calcification of abdominal aorta (HCC) 08/04/2017  . Fatty liver 03/31/2017  . Elevated liver enzymes 02/11/2017  . Serum total bilirubin elevated 02/11/2017  . Hyperlipidemia LDL goal <70 01/04/2017  . Elevated serum glutamic pyruvic transaminase (SGPT) level 01/04/2017  . Angioedema   . Encounter for medication monitoring 08/25/2016  . Coronary artery disease 08/25/2016  . Uncontrolled diabetes mellitus with hyperglycemia ([place])   . Chest pain 08/08/2016  . Preventative health care 08/02/2016  . Ketonuria 07/12/2016  . Glucosuria 07/12/2016  . 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 Palpitations  08/12/2015  . Family history of malignant neoplasm of gastrointestinal tract   . Benign neoplasm of rectosigmoid junction   . Essential hypertension 05/28/2015  . Gout 05/28/2015  . IBS (irritable bowel syndrome) 05/28/2015    Lieutenant Diego 10/18/2019, 12:26 PM  [facility] [address]. [place], [place], [address] Phone: [phone]   Fax:  [phone]  Name: Aimee Koeppe MRN: [id number] Date of Birth: Jan 02, 1971

## 2019-10-17 NOTE — Therapy (Signed)
[place] [facility] [address] [place], [place], [address] Phone: [phone]   Fax:  [phone]  Physical Therapy Treatment  Patient Details  Name: John Hartman MRN: [id number] Date of Birth: 06/14/1971 Referring Provider (PT): Laure Kidney, MD   Encounter Date: 10/15/2019  PT End of Session - 10/17/19 1137    Visit Number  4    Number of Visits  8    Date for PT Re-Evaluation  10/31/19    Authorization - Visit Number  4    Authorization - Number of Visits  10    PT Start Time  [id number]    PT Stop Time  [id number]    PT Time Calculation (min)  52 min    Activity Tolerance  Patient tolerated treatment well;Patient limited by pain    Behavior During Therapy  [facility] for tasks assessed/performed       Past Medical History:  Diagnosis Date  . Angioedema   . Bronchitis   . DDD (degenerative disc disease), cervical   . Diabetes mellitus ([place])   . Fatty liver 03/31/2017   [place] April 2018  . GERD (gastroesophageal reflux disease)   . Gout   . Heart attack ([place])   . HTN (hypertension)   . Hypertension    CONTROLLED ON MEDS  . IBS (irritable bowel syndrome)   . LVH (left ventricular hypertrophy) 11/24/2018   Moderate, ECHO  . Myocardial infarction ([place]) 08/23/2018  . Seasonal allergies   . Spinal stenosis     Past Surgical History:  Procedure Laterality Date  . ACDF with fusion  2007  . CARDIAC CATHETERIZATION Left 08/15/2016   Procedure: Left Heart Cath and Coronary Angiography;  Surgeon: Wellington Hampshire, MD;  Location: [place] [facility];  Service: Cardiovascular;  Laterality: Left;  . COLONOSCOPY WITH PROPOFOL N/A 06/26/2015   Procedure: COLONOSCOPY WITH PROPOFOL;  Surgeon: Lucilla Lame, MD;  Location: [place];  Service: Endoscopy;  Laterality: N/A;  with biopsies  . CORONARY/GRAFT ACUTE MI REVASCULARIZATION N/A 08/23/2018   Procedure: Coronary/Graft Acute MI Revascularization;  Surgeon: Isaias Cowman, MD;  Location: [place] [facility];  Service: Cardiovascular;  Laterality: N/A;  . LEFT HEART CATH AND CORONARY ANGIOGRAPHY N/A 08/23/2018   Procedure: LEFT HEART CATH AND CORONARY ANGIOGRAPHY;  Surgeon: Isaias Cowman, MD;  Location: [place] [facility];  Service: Cardiovascular;  Laterality: N/A;  . NASAL SEPTUM SURGERY  2004  . SHOULDER SURGERY Left 2007  . Testicular torsion  1980s  . VASECTOMY      There were no vitals filed for this visit.  Subjective Assessment - 10/17/19 1134    Subjective  Pt. had f/u with MD yesterday and removed stitiches.  Pt. has steristrips in place and good healing noted.  Marked decrease in overall R shoulder/ UE ecchymosis and swelling as compared to last tx. session.  Pt. continues to require use of sling for next 2-4 weeks.  Pt. is currently 2 weeks s/p surgery.    Patient is accompained by:  Family member    Pertinent History  pt. known to PT.  Pt. is planning on lumbar fusion once he has completely recovered from shoulder.  Pt. has adhesive allergy from tape.    Limitations  House hold activities;Lifting    Patient Stated Goals  Increase R shoulder AROM/ strength/ pain-free mobility.    Currently in Pain?  Yes    Pain Score  3     Pain Location  Shoulder  Pain Orientation  Right    Pain Descriptors / Indicators  Aching;Constant;Sharp    Pain Type  Acute pain          Manual tx.:  Seated STM to R UT/ posterior deltoid.  Assessment of R shoulder incisions/ steristrips (healing well).  Decrease in swelling/ bruising as compared to last week.  Supine R shoulder PROM: flexion 10x2, abduction 10x2, IR/ER 15x (to pain tolerable range with short static holds)- gentle oscillations with initial PROM to decrease guarding.  Supine R scapular protraction/ serratus punches AA/PROM 10x2.  Supine R elbow flexion/ extension/ pron./ supination (good AROM).  Discussed HEP  Pt. Will ice at home.     PT Long Term Goals - 10/14/19 0754      PT LONG TERM GOAL #1    Title  Pt. I with HEP to increase R shoulder PROM to [facility] as compared to L to progress to AROM/ MD protocol.    Baseline  Supine R shoulder PROM: flexion 88 deg., abduction 44 deg., IR 55 deg., ER -4 deg.    Time  4    Period  Weeks    Status  New    Target Date  10/31/19      PT LONG TERM GOAL #2   Title  Pt. will complete FOTO and score norm values to improve R shoulder functional mobility.    Baseline  TBD    Time  4    Period  Weeks    Status  New    Target Date  10/31/19      PT LONG TERM GOAL #3   Title  Pt. will complete grip strength testing and score >10% of L to improve grasping/ strengthening.    Baseline  TBD    Time  4    Period  Weeks    Status  New    Target Date  10/31/19      PT LONG TERM GOAL #4   Title  Pt. will start R shoulder AROM and demonstrate >90 deg. flexion/ abduction to improve progression of HEP/ daily tasks.    Baseline  No AROM at this time.    Time  4    Period  Weeks    Status  New    Target Date  10/31/19         Plan - 10/17/19 1138    Clinical Impression Statement  Pt. shows marked increase in R shoulder PROM today at end of manual tx.  Supine R shoulder PROM: flexion 128 deg., abduction 88 deg., ER 28 deg. (pain tolerable).  Pt. remains guarded due to pain, not capsular stiffness.  Full R elbow AROM (flexion/ extension/ sup./ pron.).  No pain during supine R scapular protraction/ serratus punches.  PT will add pulley ex. for HEP next tx. session.    Stability/Clinical Decision Making  Evolving/Moderate complexity    Clinical Decision Making  Moderate    Rehab Potential  Good    PT Frequency  2x / week    PT Duration  4 weeks    PT Treatment/Interventions  ADLs/Self Care Home Management;Aquatic Therapy;Cryotherapy;Electrical Stimulation;Traction;Dry needling;Passive range of motion;Manual techniques;Neuromuscular re-education;Patient/family education;Therapeutic exercise;Therapeutic activities;Functional mobility training;Gait training     PT Next Visit Plan  R shoulder PROM/ see MD protoocol.  Add pulley R shoulder PROM (flexion/ abduction) to HEP next tx. session.    Consulted and Agree with Plan of Care  Patient       Patient will benefit from skilled therapeutic  intervention in order to improve the following deficits and impairments:  Pain, Improper body mechanics, Postural dysfunction, Decreased activity tolerance, Decreased endurance, Decreased range of motion, Decreased strength, Hypomobility, Difficulty walking, Impaired flexibility, Impaired UE functional use  Visit Diagnosis: Nontraumatic complete tear of right rotator cuff  Shoulder joint stiffness, right  Muscle weakness (generalized)  Chronic right shoulder pain     Problem List Patient Active Problem List   Diagnosis Date Noted  . 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 Tachycardia 03/24/2019  . Status post myocardial infarction 11/24/2018  . LVH (left ventricular hypertrophy) 11/24/2018  . 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 NSTEMI (non-ST elevated myocardial infarction) ([place]) 08/22/2018  . Elevated hemoglobin ([place]) 08/11/2017  . Chronic lower back pain 08/04/2017  . Degenerative disc disease, lumbar 08/04/2017  . 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 Calcification of abdominal aorta (HCC) 08/04/2017  . Fatty liver 03/31/2017  . Elevated liver enzymes 02/11/2017  . Serum total bilirubin elevated 02/11/2017  . Hyperlipidemia LDL goal <70 01/04/2017  . Elevated serum glutamic pyruvic transaminase (SGPT) level 01/04/2017  . Angioedema   . Encounter for medication monitoring 08/25/2016  . Coronary artery disease 08/25/2016  . Uncontrolled diabetes mellitus with hyperglycemia ([place])   . Chest pain 08/08/2016  . Preventative health care 08/02/2016  . Ketonuria 07/12/2016  . Glucosuria 07/12/2016  . 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 Palpitations 08/12/2015  . Family history of malignant neoplasm of gastrointestinal tract   . Benign neoplasm of  rectosigmoid junction   . Essential hypertension 05/28/2015  . Gout 05/28/2015  . IBS (irritable bowel syndrome) 05/28/2015   Pura Spice, PT, DPT # [phone] 10/17/2019, 11:43 AM  [place] [facility] [address] [place], [place], [address] Phone: [phone]   Fax:  [phone]  Name: John Hartman MRN: [id number] Date of Birth: [birth year]

## 2019-10-18 LAB — HEMOGLOBIN A1C: Hemoglobin A1C: 7.2

## 2019-10-22 ENCOUNTER — Ambulatory Visit: Payer: 59 | Admitting: Physical Therapy

## 2019-10-22 ENCOUNTER — Other Ambulatory Visit: Payer: Self-pay

## 2019-10-22 DIAGNOSIS — M25511 Pain in right shoulder: Secondary | ICD-10-CM

## 2019-10-22 DIAGNOSIS — M75121 Complete rotator cuff tear or rupture of right shoulder, not specified as traumatic: Secondary | ICD-10-CM

## 2019-10-22 DIAGNOSIS — G8929 Other chronic pain: Secondary | ICD-10-CM

## 2019-10-22 DIAGNOSIS — M25611 Stiffness of right shoulder, not elsewhere classified: Secondary | ICD-10-CM

## 2019-10-22 DIAGNOSIS — M6281 Muscle weakness (generalized): Secondary | ICD-10-CM

## 2019-10-22 NOTE — Patient Instructions (Signed)
Access Code: [email or web address]  URL: [email or web address]  Date: 10/22/2019  Prepared by: Dorcas Carrow   Exercises  Seated Shoulder Flexion AAROM with Pulley Behind - 20 reps - 1 sets - 2x daily - 7x weekly  Seated Shoulder Abduction AAROM with Pulley Behind - 20 reps - 1 sets - 2x daily - 7x weekly  Supine Shoulder Press AAROM in Abduction with [place] - 20 reps - 1 sets - 2x daily - 7x weekly  Supine Shoulder External Rotation with [place] - 20 reps - 1 sets - 2x daily - 7x weekly

## 2019-10-23 NOTE — Therapy (Signed)
[facility] [address]. [place], [place], [address] Phone: [phone]   Fax:  [phone]  Physical Therapy Treatment  Patient Details  Name: John Hartman MRN: [id number] Date of Birth: 01/19/1971 Referring Provider (PT): Laure Kidney, MD   Encounter Date: 10/22/2019  PT End of Session - 10/23/19 1959    Visit Number  6    Number of Visits  8    Date for PT Re-Evaluation  10/31/19    Authorization - Visit Number  6    Authorization - Number of Visits  10    PT Start Time  [id number]    PT Stop Time  1736    PT Time Calculation (min)  52 min    Activity Tolerance  Patient tolerated treatment well    Behavior During Therapy  [facility] for tasks assessed/performed       Past Medical History:  Diagnosis Date  . Angioedema   . Bronchitis   . DDD (degenerative disc disease), cervical   . Diabetes mellitus ([place])   . Fatty liver 03/31/2017   [place] April 2018  . GERD (gastroesophageal reflux disease)   . Gout   . Heart attack ([place])   . HTN (hypertension)   . Hypertension    CONTROLLED ON MEDS  . IBS (irritable bowel syndrome)   . LVH (left ventricular hypertrophy) 11/24/2018   Moderate, ECHO  . Myocardial infarction ([place]) 08/23/2018  . Seasonal allergies   . Spinal stenosis     Past Surgical History:  Procedure Laterality Date  . ACDF with fusion  2007  . CARDIAC CATHETERIZATION Left 08/15/2016   Procedure: Left Heart Cath and Coronary Angiography;  Surgeon: Wellington Hampshire, MD;  Location: [place] [facility];  Service: Cardiovascular;  Laterality: Left;  . COLONOSCOPY WITH PROPOFOL N/A 06/26/2015   Procedure: COLONOSCOPY WITH PROPOFOL;  Surgeon: Lucilla Lame, MD;  Location: [place];  Service: Endoscopy;  Laterality: N/A;  with biopsies  . CORONARY/GRAFT ACUTE MI REVASCULARIZATION N/A 08/23/2018   Procedure: Coronary/Graft Acute MI Revascularization;  Surgeon: Isaias Cowman, MD;  Location: [place] [facility];  Service:  Cardiovascular;  Laterality: N/A;  . LEFT HEART CATH AND CORONARY ANGIOGRAPHY N/A 08/23/2018   Procedure: LEFT HEART CATH AND CORONARY ANGIOGRAPHY;  Surgeon: Isaias Cowman, MD;  Location: [place] [facility];  Service: Cardiovascular;  Laterality: N/A;  . NASAL SEPTUM SURGERY  2004  . SHOULDER SURGERY Left 2007  . Testicular torsion  1980s  . VASECTOMY      There were no vitals filed for this visit.  Subjective Assessment - 10/23/19 1957    Subjective  Pt. states he did not take pain meds prior to PT tx. session today.  Pt. reports he is continuing to improve overall and remains compliant with sling use/ HEP.    Patient is accompained by:  Family member    Pertinent History  pt. known to PT.  Pt. is planning on lumbar fusion once he has completely recovered from shoulder.  Pt. has adhesive allergy from tape.    Limitations  House hold activities;Lifting    Patient Stated Goals  Increase R shoulder AROM/ strength/ pain-free mobility.    Currently in Pain?  Yes    Pain Score  3     Pain Location  Shoulder    Pain Orientation  Right         TREATMENT:  Therapeutic exercise:  Seated R shoulder pulley PROM: flexion and abduction 25x each.  Issued pulley for home use Supine wand ex. (AA/PROM): press-ups/ ER/ tricep extension 20x.  See handout.  Seated R scapular protraction 2x10   Manual tx.:  Supine R shoulder PROM: flexion 10x2, abduction 10x2, IR/ER 15x (to pain tolerable range with short static holds)- gentle oscillations with initial PROM to decrease guarding.  Supine/seated STM to R UT/ posterior deltoid. Good incision healing and improvements noted in bruising/ swelling  Pt. Will ice at home.   PT Education - 10/23/19 1959    Education provided  Yes    Education Details  See HEP (issued pulleys)    Person(s) Educated  Patient;Spouse    Methods  Explanation;Demonstration;Handout    Comprehension  Verbalized understanding;Returned demonstration           PT Long Term Goals - 10/14/19 0754      PT LONG TERM GOAL #1   Title  Pt. I with HEP to increase R shoulder PROM to [facility] as compared to L to progress to AROM/ MD protocol.    Baseline  Supine R shoulder PROM: flexion 88 deg., abduction 44 deg., IR 55 deg., ER -4 deg.    Time  4    Period  Weeks    Status  New    Target Date  10/31/19      PT LONG TERM GOAL #2   Title  Pt. will complete FOTO and score norm values to improve R shoulder functional mobility.    Baseline  TBD    Time  4    Period  Weeks    Status  New    Target Date  10/31/19      PT LONG TERM GOAL #3   Title  Pt. will complete grip strength testing and score >10% of L to improve grasping/ strengthening.    Baseline  TBD    Time  4    Period  Weeks    Status  New    Target Date  10/31/19      PT LONG TERM GOAL #4   Title  Pt. will start R shoulder AROM and demonstrate >90 deg. flexion/ abduction to improve progression of HEP/ daily tasks.    Baseline  No AROM at this time.    Time  4    Period  Weeks    Status  New    Target Date  10/31/19            Plan - 10/23/19 2000    Clinical Impression Statement  Marked increase in R shoulder self-PROM with use of pulleys: flexion 154 deg., abduction 150 deg.   Supine R shoulder AA/PROM ER remain limited at <30 deg.  Pt. issued pulleys for home use (2x/day) for PROM (pain tolerable) with static holds.  Pts. incisions are healing well with steristrips in place and no tenderness with palpation.  Pt. progressing well with PROM to [facility] over next week (wand/ pulleys).    Stability/Clinical Decision Making  Evolving/Moderate complexity    Clinical Decision Making  Moderate    Rehab Potential  Good    PT Frequency  2x / week    PT Duration  4 weeks    PT Treatment/Interventions  ADLs/Self Care Home Management;Aquatic Therapy;Cryotherapy;Electrical Stimulation;Traction;Dry needling;Passive range of motion;Manual techniques;Neuromuscular  re-education;Patient/family education;Therapeutic exercise;Therapeutic activities;Functional mobility training;Gait training    PT Next Visit Plan  R shoulder PROM/ see MD protoocol.  R shoulder AA/PROM (pain tolerable).    PT Home Exercise Plan  Access Code: [email or web address]  Consulted and Agree with Plan of Care  Patient       Patient will benefit from skilled therapeutic intervention in order to improve the following deficits and impairments:  Pain, Improper body mechanics, Postural dysfunction, Decreased activity tolerance, Decreased endurance, Decreased range of motion, Decreased strength, Hypomobility, Difficulty walking, Impaired flexibility, Impaired UE functional use  Visit Diagnosis: Nontraumatic complete tear of right rotator cuff  Shoulder joint stiffness, right  Muscle weakness (generalized)  Chronic right shoulder pain     Problem List Patient Active Problem List   Diagnosis Date Noted  . 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 Tachycardia 03/24/2019  . Status post myocardial infarction 11/24/2018  . LVH (left ventricular hypertrophy) 11/24/2018  . 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 NSTEMI (non-ST elevated myocardial infarction) ([place]) 08/22/2018  . Elevated hemoglobin ([place]) 08/11/2017  . Chronic lower back pain 08/04/2017  . Degenerative disc disease, lumbar 08/04/2017  . 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 Calcification of abdominal aorta (HCC) 08/04/2017  . Fatty liver 03/31/2017  . Elevated liver enzymes 02/11/2017  . Serum total bilirubin elevated 02/11/2017  . Hyperlipidemia LDL goal <70 01/04/2017  . Elevated serum glutamic pyruvic transaminase (SGPT) level 01/04/2017  . Angioedema   . Encounter for medication monitoring 08/25/2016  . Coronary artery disease 08/25/2016  . Uncontrolled diabetes mellitus with hyperglycemia ([place])   . Chest pain 08/08/2016  . Preventative health care 08/02/2016  . Ketonuria 07/12/2016  . Glucosuria 07/12/2016  .  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 Palpitations 08/12/2015  . Family history of malignant neoplasm of gastrointestinal tract   . Benign neoplasm of rectosigmoid junction   . Essential hypertension 05/28/2015  . Gout 05/28/2015  . IBS (irritable bowel syndrome) 05/28/2015   Pura Spice, PT, DPT # [id number] 10/23/2019, 8:29 PM  [facility] [address] [place], [place], [address] Phone: [phone]   Fax:  [phone]  Name: John Hartman MRN: [id number] Date of Birth: 1971/12/25

## 2019-10-24 ENCOUNTER — Other Ambulatory Visit: Payer: Self-pay

## 2019-10-24 ENCOUNTER — Encounter: Payer: Self-pay | Admitting: Physical Therapy

## 2019-10-24 ENCOUNTER — Ambulatory Visit: Payer: 59

## 2019-10-24 DIAGNOSIS — M25611 Stiffness of right shoulder, not elsewhere classified: Secondary | ICD-10-CM

## 2019-10-24 DIAGNOSIS — G8929 Other chronic pain: Secondary | ICD-10-CM

## 2019-10-24 DIAGNOSIS — M25511 Pain in right shoulder: Secondary | ICD-10-CM

## 2019-10-24 DIAGNOSIS — M75121 Complete rotator cuff tear or rupture of right shoulder, not specified as traumatic: Secondary | ICD-10-CM

## 2019-10-24 DIAGNOSIS — M6281 Muscle weakness (generalized): Secondary | ICD-10-CM

## 2019-10-24 NOTE — Therapy (Signed)
[facility] [address] [place], [place], [address] Phone: [phone]   Fax:  [phone]  Physical Therapy Treatment  Patient Details  Name: John Hartman MRN: [id number] Date of Birth: 04/16/1971 Referring Provider (PT): Laure Kidney, MD   Encounter Date: 10/24/2019  PT End of Session - 10/24/19 1726    Visit Number  7    Number of Visits  8    Date for PT Re-Evaluation  10/31/19    Authorization - Visit Number  7    Authorization - Number of Visits  10    PT Start Time  [id number]    PT Stop Time  1730    PT Time Calculation (min)  45 min    Activity Tolerance  Patient tolerated treatment well    Behavior During Therapy  [facility] for tasks assessed/performed       Past Medical History:  Diagnosis Date  . Angioedema   . Bronchitis   . DDD (degenerative disc disease), cervical   . Diabetes mellitus ([place])   . Fatty liver 03/31/2017   [place] April 2018  . GERD (gastroesophageal reflux disease)   . Gout   . Heart attack ([place])   . HTN (hypertension)   . Hypertension    CONTROLLED ON MEDS  . IBS (irritable bowel syndrome)   . LVH (left ventricular hypertrophy) 11/24/2018   Moderate, ECHO  . Myocardial infarction ([place]) 08/23/2018  . Seasonal allergies   . Spinal stenosis     Past Surgical History:  Procedure Laterality Date  . ACDF with fusion  2007  . CARDIAC CATHETERIZATION Left 08/15/2016   Procedure: Left Heart Cath and Coronary Angiography;  Surgeon: Wellington Hampshire, MD;  Location: [place] [facility];  Service: Cardiovascular;  Laterality: Left;  . COLONOSCOPY WITH PROPOFOL N/A 06/26/2015   Procedure: COLONOSCOPY WITH PROPOFOL;  Surgeon: Lucilla Lame, MD;  Location: [place];  Service: Endoscopy;  Laterality: N/A;  with biopsies  . CORONARY/GRAFT ACUTE MI REVASCULARIZATION N/A 08/23/2018   Procedure: Coronary/Graft Acute MI Revascularization;  Surgeon: Isaias Cowman, MD;  Location: [place] [facility];  Service:  Cardiovascular;  Laterality: N/A;  . LEFT HEART CATH AND CORONARY ANGIOGRAPHY N/A 08/23/2018   Procedure: LEFT HEART CATH AND CORONARY ANGIOGRAPHY;  Surgeon: Isaias Cowman, MD;  Location: [place] [facility];  Service: Cardiovascular;  Laterality: N/A;  . NASAL SEPTUM SURGERY  2004  . SHOULDER SURGERY Left 2007  . Testicular torsion  1980s  . VASECTOMY      There were no vitals filed for this visit.  Subjective Assessment - 10/24/19 1645    Subjective  Patient reported that after last PT session he had significant pain for the rest of the day and the next day. Has improved today.    Pertinent History  pt. known to PT.  Pt. is planning on lumbar fusion once he has completely recovered from shoulder.  Pt. has adhesive allergy from tape.    Patient Stated Goals  Increase R shoulder AROM/ strength/ pain-free mobility.    Currently in Pain?  Yes    Pain Score  3     Pain Location  Shoulder    Pain Orientation  Right    Pain Descriptors / Indicators  Aching    Pain Type  Acute pain          TREATMENT:  Therapeutic exercise:  Seated R shoulder pulley PROM: flexion and abduction 25x each.  Issued pulley for home use  Supine wand ex. (AA/PROM): press-ups/ ER/ tricep extension 20x.  See handout.  SeatedR scapular protraction2x10   Manual tx.:  Supine R shoulder PROM: flexion 10x2, abduction 10x2, IR/ER 15x (to pain tolerable range with short static holds)- gentle oscillations with initial PROM to decrease guarding.  Supine/seated STM to R UT/ posterior deltoid. Good incision healing and improvements noted in bruising/ swelling  Pt. Will ice at home.    Pt response/clinical impression: Pt reported most pain/difficulty with (AAROM/PROM of tricep extension). Concordant pain reported with the movement compared to pain after previous session. Overall the patient continued to demonstrate improvement in ROM, flexion with pulleys (160deg).      PT Education -  10/24/19 1647    Education provided  Yes    Education Details  HEP, exercise form    Person(s) Educated  Patient;Spouse    Methods  Explanation;Demonstration;Tactile cues    Comprehension  Verbalized understanding;Returned demonstration          PT Long Term Goals - 10/14/19 0754      PT LONG TERM GOAL #1   Title  Pt. I with HEP to increase R shoulder PROM to [facility] as compared to L to progress to AROM/ MD protocol.    Baseline  Supine R shoulder PROM: flexion 88 deg., abduction 44 deg., IR 55 deg., ER -4 deg.    Time  4    Period  Weeks    Status  New    Target Date  10/31/19      PT LONG TERM GOAL #2   Title  Pt. will complete FOTO and score norm values to improve R shoulder functional mobility.    Baseline  TBD    Time  4    Period  Weeks    Status  New    Target Date  10/31/19      PT LONG TERM GOAL #3   Title  Pt. will complete grip strength testing and score >10% of L to improve grasping/ strengthening.    Baseline  TBD    Time  4    Period  Weeks    Status  New    Target Date  10/31/19      PT LONG TERM GOAL #4   Title  Pt. will start R shoulder AROM and demonstrate >90 deg. flexion/ abduction to improve progression of HEP/ daily tasks.    Baseline  No AROM at this time.    Time  4    Period  Weeks    Status  New    Target Date  10/31/19            Plan - 10/24/19 1715    Clinical Impression Statement  Pt reported most pain/difficulty with (AAROM/PROM of tricep extension). Concordant pain reported with the movement compared to pain after previous session. Overall the patient continued to demonstrate improvement in ROM, flexion with pulleys (160deg).    Stability/Clinical Decision Making  Evolving/Moderate complexity    Rehab Potential  Good    PT Frequency  2x / week    PT Duration  4 weeks    PT Treatment/Interventions  ADLs/Self Care Home Management;Aquatic Therapy;Cryotherapy;Electrical Stimulation;Traction;Dry needling;Passive range of motion;Manual  techniques;Neuromuscular re-education;Patient/family education;Therapeutic exercise;Therapeutic activities;Functional mobility training;Gait training    PT Next Visit Plan  R shoulder PROM/ see MD protoocol.  R shoulder AA/PROM (pain tolerable).    PT Home Exercise Plan  Access Code: [email or web address]    Consulted and Agree with Plan of Care  Patient  Patient will benefit from skilled therapeutic intervention in order to improve the following deficits and impairments:  Pain, Improper body mechanics, Postural dysfunction, Decreased activity tolerance, Decreased endurance, Decreased range of motion, Decreased strength, Hypomobility, Difficulty walking, Impaired flexibility, Impaired UE functional use  Visit Diagnosis: Nontraumatic complete tear of right rotator cuff  Shoulder joint stiffness, right  Muscle weakness (generalized)  Chronic right shoulder pain     Problem List Patient Active Problem List   Diagnosis Date Noted  . 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 Tachycardia 03/24/2019  . Status post myocardial infarction 11/24/2018  . LVH (left ventricular hypertrophy) 11/24/2018  . 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 NSTEMI (non-ST elevated myocardial infarction) ([place]) 08/22/2018  . Elevated hemoglobin ([place]) 08/11/2017  . Chronic lower back pain 08/04/2017  . Degenerative disc disease, lumbar 08/04/2017  . 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 Calcification of abdominal aorta (HCC) 08/04/2017  . Fatty liver 03/31/2017  . Elevated liver enzymes 02/11/2017  . Serum total bilirubin elevated 02/11/2017  . Hyperlipidemia LDL goal <70 01/04/2017  . Elevated serum glutamic pyruvic transaminase (SGPT) level 01/04/2017  . Angioedema   . Encounter for medication monitoring 08/25/2016  . Coronary artery disease 08/25/2016  . Uncontrolled diabetes mellitus with hyperglycemia ([place])   . Chest pain 08/08/2016  . Preventative health care 08/02/2016  . Ketonuria 07/12/2016  . Glucosuria  07/12/2016  . 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 Palpitations 08/12/2015  . Family history of malignant neoplasm of gastrointestinal tract   . Benign neoplasm of rectosigmoid junction   . Essential hypertension 05/28/2015  . Gout 05/28/2015  . IBS (irritable bowel syndrome) 05/28/2015   Lieutenant Diego PT, DPT 5:27 PM,10/24/19 [id number]  [facility] [address] [place], [place], [address] Phone: [phone]   Fax:  [phone]  Name: John Hartman MRN: [id number] Date of Birth: 1971/01/25

## 2019-10-29 ENCOUNTER — Other Ambulatory Visit: Payer: Self-pay

## 2019-10-29 ENCOUNTER — Ambulatory Visit: Payer: 59 | Attending: Orthopedic Surgery | Admitting: Physical Therapy

## 2019-10-29 DIAGNOSIS — G8929 Other chronic pain: Secondary | ICD-10-CM | POA: Diagnosis present

## 2019-10-29 DIAGNOSIS — M25611 Stiffness of right shoulder, not elsewhere classified: Secondary | ICD-10-CM | POA: Diagnosis present

## 2019-10-29 DIAGNOSIS — M25511 Pain in right shoulder: Secondary | ICD-10-CM | POA: Diagnosis present

## 2019-10-29 DIAGNOSIS — M6281 Muscle weakness (generalized): Secondary | ICD-10-CM

## 2019-10-29 DIAGNOSIS — M75121 Complete rotator cuff tear or rupture of right shoulder, not specified as traumatic: Secondary | ICD-10-CM | POA: Insufficient documentation

## 2019-10-30 ENCOUNTER — Encounter: Payer: Self-pay | Admitting: Physical Therapy

## 2019-10-30 NOTE — Therapy (Signed)
[place] [address] [place], [place], [address] Phone: [phone]   Fax:  [phone]  Physical Therapy Treatment  Patient Details  Name: John Hartman MRN: [id number] Date of Birth: 1971-08-14 Referring Provider (PT): Laure Kidney, MD   Encounter Date: 10/29/2019  PT End of Session - 10/30/19 1051    Visit Number  8    Number of Visits  8    Date for PT Re-Evaluation  10/31/19    Authorization - Visit Number  8    Authorization - Number of Visits  10    PT Start Time  [id number]    PT Stop Time  [id number]    PT Time Calculation (min)  48 min    Activity Tolerance  Patient tolerated treatment well    Behavior During Therapy  [facility] for tasks assessed/performed       Past Medical History:  Diagnosis Date  . Angioedema   . Bronchitis   . DDD (degenerative disc disease), cervical   . Diabetes mellitus ([place])   . Fatty liver 03/31/2017   [place] April 2018  . GERD (gastroesophageal reflux disease)   . Gout   . Heart attack ([place])   . HTN (hypertension)   . Hypertension    CONTROLLED ON MEDS  . IBS (irritable bowel syndrome)   . LVH (left ventricular hypertrophy) 11/24/2018   Moderate, ECHO  . Myocardial infarction ([place]) 08/23/2018  . Seasonal allergies   . Spinal stenosis     Past Surgical History:  Procedure Laterality Date  . ACDF with fusion  2007  . CARDIAC CATHETERIZATION Left 08/15/2016   Procedure: Left Heart Cath and Coronary Angiography;  Surgeon: Wellington Hampshire, MD;  Location: [place] [facility];  Service: Cardiovascular;  Laterality: Left;  . COLONOSCOPY WITH PROPOFOL N/A 06/26/2015   Procedure: COLONOSCOPY WITH PROPOFOL;  Surgeon: Lucilla Lame, MD;  Location: [place];  Service: Endoscopy;  Laterality: N/A;  with biopsies  . CORONARY/GRAFT ACUTE MI REVASCULARIZATION N/A 08/23/2018   Procedure: Coronary/Graft Acute MI Revascularization;  Surgeon: Isaias Cowman, MD;  Location: [place] [facility];  Service:  Cardiovascular;  Laterality: N/A;  . LEFT HEART CATH AND CORONARY ANGIOGRAPHY N/A 08/23/2018   Procedure: LEFT HEART CATH AND CORONARY ANGIOGRAPHY;  Surgeon: Isaias Cowman, MD;  Location: [place] [facility];  Service: Cardiovascular;  Laterality: N/A;  . NASAL SEPTUM SURGERY  2004  . SHOULDER SURGERY Left 2007  . Testicular torsion  1980s  . VASECTOMY      There were no vitals filed for this visit.  Subjective Assessment - 10/30/19 1038    Subjective  Pt. states he is doing okay right now but was been sore/ hurting over weekend.  Pt. accidently tried to catch a falling ice pack on shoulder and had an increase in pain.  Pt. continues to use sling in bed and when out of house.  Pt. reports compliance with HEP at this time.  Pt. did not give a subjective pain score.    Pertinent History  pt. known to PT.  Pt. is planning on lumbar fusion once he has completely recovered from shoulder.  Pt. has adhesive allergy from tape.    Patient Stated Goals  Increase R shoulder AROM/ strength/ pain-free mobility.    Currently in Pain?  Yes    Pain Location  Shoulder    Pain Orientation  Right            There.ex.:  Seated R  shoulder AAROM with wand (sh. Flexion/ chest press/ ER) 20x Standing R shoulder AAROM with wand (sh. Extension) 20x Standing wall ladder: shoulder flexion 6x (marked in sticker) Supine R shoulder AAROM: chest press/ ER/ tricep extension 10x PT assist with all aspects of AAROM of R shoulder (all planes)  Discussed scar massage/ use of vitamin E lotion  Pt. Will ice at home   PT Long Term Goals - 10/14/19 0754      PT LONG TERM GOAL #1   Title  Pt. I with HEP to increase R shoulder PROM to [facility] as compared to L to progress to AROM/ MD protocol.    Baseline  Supine R shoulder PROM: flexion 88 deg., abduction 44 deg., IR 55 deg., ER -4 deg.    Time  4    Period  Weeks    Status  New    Target Date  10/31/19      PT LONG TERM GOAL #2   Title  Pt. will complete  FOTO and score norm values to improve R shoulder functional mobility.    Baseline  TBD    Time  4    Period  Weeks    Status  New    Target Date  10/31/19      PT LONG TERM GOAL #3   Title  Pt. will complete grip strength testing and score >10% of L to improve grasping/ strengthening.    Baseline  TBD    Time  4    Period  Weeks    Status  New    Target Date  10/31/19      PT LONG TERM GOAL #4   Title  Pt. will start R shoulder AROM and demonstrate >90 deg. flexion/ abduction to improve progression of HEP/ daily tasks.    Baseline  No AROM at this time.    Time  4    Period  Weeks    Status  New    Target Date  10/31/19            Plan - 10/30/19 1051    Clinical Impression Statement  R shoulder PROM is doing well but pain remains present.  Pt. is able to progress with R shoulder AAROM in supine/ seated position with PT and wand assist.  Great incision healing/ no scabs present.  PT discussed benefits of scar massage to incision sites.  Pt. will continue with current HEP and PT will progress next tx. session.    Stability/Clinical Decision Making  Evolving/Moderate complexity    Clinical Decision Making  Moderate    Rehab Potential  Good    PT Frequency  2x / week    PT Duration  4 weeks    PT Treatment/Interventions  ADLs/Self Care Home Management;Aquatic Therapy;Cryotherapy;Electrical Stimulation;Traction;Dry needling;Passive range of motion;Manual techniques;Neuromuscular re-education;Patient/family education;Therapeutic exercise;Therapeutic activities;Functional mobility training;Gait training    PT Next Visit Plan  R shoulder PROM/ see MD protoocol.  R shoulder AA/PROM (pain tolerable).  ISSUE NEW HEP NEXT TX SESSION    PT Home Exercise Plan  Access Code: [email or web address]    Consulted and Agree with Plan of Care  Patient       Patient will benefit from skilled therapeutic intervention in order to improve the following deficits and impairments:  Pain, Improper body mechanics,  Postural dysfunction, Decreased activity tolerance, Decreased endurance, Decreased range of motion, Decreased strength, Hypomobility, Difficulty walking, Impaired flexibility, Impaired UE functional use  Visit Diagnosis: Nontraumatic complete tear of right rotator cuff  Shoulder joint stiffness, right  Muscle weakness (generalized)  Chronic right shoulder pain     Problem List Patient Active Problem List   Diagnosis Date Noted  . 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 Tachycardia 03/24/2019  . Status post myocardial infarction 11/24/2018  . LVH (left ventricular hypertrophy) 11/24/2018  . 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 NSTEMI (non-ST elevated myocardial infarction) ([place]) 08/22/2018  . Elevated hemoglobin ([place]) 08/11/2017  . Chronic lower back pain 08/04/2017  . Degenerative disc disease, lumbar 08/04/2017  . 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 Calcification of abdominal aorta (HCC) 08/04/2017  . Fatty liver 03/31/2017  . Elevated liver enzymes 02/11/2017  . Serum total bilirubin elevated 02/11/2017  . Hyperlipidemia LDL goal <70 01/04/2017  . Elevated serum glutamic pyruvic transaminase (SGPT) level 01/04/2017  . Angioedema   . Encounter for medication monitoring 08/25/2016  . Coronary artery disease 08/25/2016  . Uncontrolled diabetes mellitus with hyperglycemia ([place])   . Chest pain 08/08/2016  . Preventative health care 08/02/2016  . Ketonuria 07/12/2016  . Glucosuria 07/12/2016  . 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 Palpitations 08/12/2015  . Family history of malignant neoplasm of gastrointestinal tract   . Benign neoplasm of rectosigmoid junction   . Essential hypertension 05/28/2015  . Gout 05/28/2015  . IBS (irritable bowel syndrome) 05/28/2015   Pura Spice, PT, DPT # [phone] 10/30/2019, 10:55 AM  [facility] [address] [place], [place], [address] Phone: [phone]   Fax:  [phone]  Name: Davien Dragonetti MRN: [id number] Date of Birth: 07-04-1971

## 2019-10-31 ENCOUNTER — Ambulatory Visit: Payer: 59 | Admitting: Physical Therapy

## 2019-10-31 ENCOUNTER — Encounter: Payer: Self-pay | Admitting: Physical Therapy

## 2019-10-31 ENCOUNTER — Other Ambulatory Visit: Payer: Self-pay

## 2019-10-31 DIAGNOSIS — M6281 Muscle weakness (generalized): Secondary | ICD-10-CM

## 2019-10-31 DIAGNOSIS — G8929 Other chronic pain: Secondary | ICD-10-CM

## 2019-10-31 DIAGNOSIS — M75121 Complete rotator cuff tear or rupture of right shoulder, not specified as traumatic: Secondary | ICD-10-CM

## 2019-10-31 DIAGNOSIS — M25511 Pain in right shoulder: Secondary | ICD-10-CM

## 2019-10-31 DIAGNOSIS — M25611 Stiffness of right shoulder, not elsewhere classified: Secondary | ICD-10-CM

## 2019-10-31 NOTE — Patient Instructions (Signed)
Access Code: [email or web address]  URL: [email or web address]  Date: 10/31/2019  Prepared by: Dorcas Carrow   Exercises  Supine Shoulder Press with Dowel - 10 reps - 2 sets - 2x daily - 7x weekly  Supine Shoulder Flexion Extension AAROM with Dowel - 10 reps - 2 sets - 2x daily - 7x weekly  Supine Shoulder Abduction AAROM with Dowel - 10 reps - 2 sets - 2x daily - 7x weekly  Supine Shoulder External Rotation in 45 Degrees Abduction AAROM with Dowel - 10 reps - 2 sets - 2x daily - 7x weekly  Supine Single Arm Shoulder Protraction - 10 reps - 2 sets - 2x daily - 7x weekly  Seated Scapular Retraction - 10 reps - 2 sets - 2x daily - 7x weekly  Standing Shoulder Extension with Dowel - 10 reps - 2 sets - 2x daily - 7x weekly  Standing Bilateral Shoulder Internal Rotation AAROM with Dowel - 10 reps - 2 sets - 2x daily - 7x weekly

## 2019-10-31 NOTE — Therapy (Signed)
[facility] [address] [place], [place], [address] Phone: [phone]   Fax:  [phone]  Physical Therapy Treatment  Patient Details  Name: John Hartman MRN: [id number] Date of Birth: [birth year] Referring Provider (PT): Laure Kidney, MD   Encounter Date: 10/31/2019  PT End of Session - 11/01/19 1731    Visit Number  9    Number of Visits  17    Date for PT Re-Evaluation  11/28/19    Authorization - Visit Number  8    Authorization - Number of Visits  10    PT Start Time  7619    PT Stop Time  1736    PT Time Calculation (min)  50 min    Activity Tolerance  Patient tolerated treatment well;Patient limited by pain    Behavior During Therapy  [facility] for tasks assessed/performed       Past Medical History:  Diagnosis Date  . Angioedema   . Bronchitis   . DDD (degenerative disc disease), cervical   . Diabetes mellitus ([place])   . Fatty liver 03/31/2017   [place] April 2018  . GERD (gastroesophageal reflux disease)   . Gout   . Heart attack ([place])   . HTN (hypertension)   . Hypertension    CONTROLLED ON MEDS  . IBS (irritable bowel syndrome)   . LVH (left ventricular hypertrophy) 11/24/2018   Moderate, ECHO  . Myocardial infarction ([place]) 08/23/2018  . Seasonal allergies   . Spinal stenosis     Past Surgical History:  Procedure Laterality Date  . ACDF with fusion  2007  . CARDIAC CATHETERIZATION Left 08/15/2016   Procedure: Left Heart Cath and Coronary Angiography;  Surgeon: Wellington Hampshire, MD;  Location: [place] [facility];  Service: Cardiovascular;  Laterality: Left;  . COLONOSCOPY WITH PROPOFOL N/A 06/26/2015   Procedure: COLONOSCOPY WITH PROPOFOL;  Surgeon: Lucilla Lame, MD;  Location: [place];  Service: Endoscopy;  Laterality: N/A;  with biopsies  . CORONARY/GRAFT ACUTE MI REVASCULARIZATION N/A 08/23/2018   Procedure: Coronary/Graft Acute MI Revascularization;  Surgeon: Isaias Cowman, MD;  Location: [place] [facility];  Service: Cardiovascular;  Laterality: N/A;  . LEFT HEART CATH AND CORONARY ANGIOGRAPHY N/A 08/23/2018   Procedure: LEFT HEART CATH AND CORONARY ANGIOGRAPHY;  Surgeon: Isaias Cowman, MD;  Location: [place] [facility];  Service: Cardiovascular;  Laterality: N/A;  . NASAL SEPTUM SURGERY  2004  . SHOULDER SURGERY Left 2007  . Testicular torsion  1980s  . VASECTOMY      There were no vitals filed for this visit.  Subjective Assessment - 10/31/19 1647    Subjective  Pt. reports 4/10 R shoulder prior to tx. session.    Pertinent History  pt. known to PT.  Pt. is planning on lumbar fusion once he has completely recovered from shoulder.  Pt. has adhesive allergy from tape.    Patient Stated Goals  Increase R shoulder AROM/ strength/ pain-free mobility.    Currently in Pain?  Yes    Pain Score  4     Pain Location  Shoulder    Pain Orientation  Right    Pain Descriptors / Indicators  Aching         [facility] PT Assessment - 11/01/19 0001      Assessment   Medical Diagnosis  Incomplete tear of right rotator cuff, Bursitis of right shoulder, Arthritis of right acromioclavicular joint, Biceps tendonitis    Referring Provider (PT)  Elta Guadeloupe  Clydene Laming, MD    Onset Date/Surgical Date  10/01/19    Hand Dominance  Right      Precautions   Precautions  [place] residence      Prior Function   Level of Independence  Independent      Cognition   Overall Cognitive Status  Within Functional Limits for tasks assessed        There.ex.:  Standing wall ladder (flexion/ abduction)- 5x each. B UBE 2 min. F/b (AAROM).   Seated R shoulder AAROM with wand (sh. Flexion/ chest press/ ER) 20x Standing R shoulder AAROM with wand (sh. Extension) 20x Standing wall ladder: shoulder flexion 6x (marked in sticker) Supine R shoulder AAROM: chest press/ ER/ tricep extension 10x PT assist with all aspects of AAROM of R shoulder (all  planes)  See HEP Exercises   Supine Shoulder Press with Dowel - 10 reps - 2 sets - 2x daily - 7x weekly   Supine Shoulder Flexion Extension AAROM with Dowel - 10 reps - 2 sets - 2x daily - 7x weekly   Supine Shoulder Abduction AAROM with Dowel - 10 reps - 2 sets - 2x daily - 7x weekly   Supine Shoulder External Rotation in 45 Degrees Abduction AAROM with Dowel - 10 reps - 2 sets - 2x daily - 7x weekly   Supine Single Arm Shoulder Protraction - 10 reps - 2 sets - 2x daily - 7x weekly   Seated Scapular Retraction - 10 reps - 2 sets - 2x daily - 7x weekly   Standing Shoulder Extension with Dowel - 10 reps - 2 sets - 2x daily - 7x weekly   Standing Bilateral Shoulder Internal Rotation AAROM with Dowel - 10 reps - 2 sets - 2x daily - 7x weekly   Discussed scar massage/ use of vitamin E lotion  Pt. Will ice at home   PT Education - 11/01/19 1729    Education provided  Yes    Education Details  See HEP    Person(s) Educated  Patient;Spouse    Methods  Explanation;Demonstration;Handout    Comprehension  Verbalized understanding;Returned demonstration          PT Long Term Goals - 11/01/19 1749      PT LONG TERM GOAL #1   Title  Pt. I with HEP to increase R shoulder PROM to [facility] as compared to L to progress to AROM/ MD protocol.    Baseline  supine R shoulder AAROM per MD protocol: flexion 152 deg./ abduction 128 deg./ ER 42 deg. (pain limited)/ IR 64 deg.    Time  4    Period  Weeks    Status  Achieved    Target Date  10/31/19      PT LONG TERM GOAL #2   Title  Pt. will complete FOTO and score norm values to improve R shoulder functional mobility.    Baseline  TBD    Time  4    Period  Weeks    Status  On-going    Target Date  11/28/19      PT LONG TERM GOAL #3   Title  Pt. will complete grip strength testing and score >10% of L to improve grasping/ strengthening.    Baseline  TBD    Time  4    Period  Weeks    Status  On-going    Target Date  11/28/19  PT  LONG TERM GOAL #4   Title  Pt. will start R shoulder AROM and demonstrate >90 deg. flexion/ abduction to improve progression of HEP/ daily tasks.    Baseline  supine R shoulder AAROM per MD protocol: flexion 152 deg./ abduction 128 deg./ ER 42 deg. (pain limited)/ IR 64 deg.    Time  4    Period  Weeks    Status  Not Met    Target Date  11/28/19            Plan - 11/01/19 1730    Clinical Impression Statement  Pt. continues to progress with supine R shoulder AAROM per MD protocol:  flexion 152 deg./ abduction 128 deg./ ER 42 deg. (pain limited)/ IR 64 deg.  Pt. continues to wear sling when out of house and during sleep.  Minimal bruising remains in R UE to wrist.  Great incision healing and pt. instructed in scar massage.  Pt. will continue to benefit from skilled PT services to increase R shoulder AROM/ strengthening to improve pain-free mobility over next month.    Stability/Clinical Decision Making  Evolving/Moderate complexity    Clinical Decision Making  Moderate    Rehab Potential  Good    PT Frequency  2x / week    PT Duration  4 weeks    PT Treatment/Interventions  ADLs/Self Care Home Management;Aquatic Therapy;Cryotherapy;Electrical Stimulation;Traction;Dry needling;Passive range of motion;Manual techniques;Neuromuscular re-education;Patient/family education;Therapeutic exercise;Therapeutic activities;Functional mobility training;Gait training    PT Next Visit Plan  See MD protoocol.  R shoulder AA/PROM (pain tolerable).    PT Home Exercise Plan  Access Code: [email or web address]   Access Code: [email or web address] (wand ex)    Consulted and Agree with Plan of Care  Patient       Patient will benefit from skilled therapeutic intervention in order to improve the following deficits and impairments:  Pain, Improper body mechanics, Postural dysfunction, Decreased activity tolerance, Decreased endurance, Decreased range of motion, Decreased strength, Hypomobility, Difficulty walking, Impaired flexibility,  Impaired UE functional use  Visit Diagnosis: Nontraumatic complete tear of right rotator cuff  Shoulder joint stiffness, right  Muscle weakness (generalized)  Chronic right shoulder pain     Problem List Patient Active Problem List   Diagnosis Date Noted  . 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 Tachycardia 03/24/2019  . Status post myocardial infarction 11/24/2018  . LVH (left ventricular hypertrophy) 11/24/2018  . 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 NSTEMI (non-ST elevated myocardial infarction) ([place]) 08/22/2018  . Elevated hemoglobin ([place]) 08/11/2017  . Chronic lower back pain 08/04/2017  . Degenerative disc disease, lumbar 08/04/2017  . 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 Calcification of abdominal aorta (HCC) 08/04/2017  . Fatty liver 03/31/2017  . Elevated liver enzymes 02/11/2017  . Serum total bilirubin elevated 02/11/2017  . Hyperlipidemia LDL goal <70 01/04/2017  . Elevated serum glutamic pyruvic transaminase (SGPT) level 01/04/2017  . Angioedema   . Encounter for medication monitoring 08/25/2016  . Coronary artery disease 08/25/2016  . Uncontrolled diabetes mellitus with hyperglycemia ([place])   . Chest pain 08/08/2016  . Preventative health care 08/02/2016  . Ketonuria 07/12/2016  . Glucosuria 07/12/2016  . 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 Palpitations 08/12/2015  . Family history of malignant neoplasm of gastrointestinal tract   . Benign neoplasm of rectosigmoid junction   . Essential hypertension 05/28/2015  . Gout 05/28/2015  . IBS (irritable bowel syndrome) 05/28/2015   Pura Spice, PT, DPT # [phone] 11/01/2019, 5:52 PM  [place]  [facility] [address] [place], [place], [address] Phone: [phone]   Fax:  [phone]  Name: John Hartman MRN: [id number] Date of Birth: 06/21/1971

## 2019-11-05 ENCOUNTER — Ambulatory Visit: Payer: 59 | Admitting: Physical Therapy

## 2019-11-05 ENCOUNTER — Other Ambulatory Visit: Payer: Self-pay

## 2019-11-05 DIAGNOSIS — M25511 Pain in right shoulder: Secondary | ICD-10-CM

## 2019-11-05 DIAGNOSIS — G8929 Other chronic pain: Secondary | ICD-10-CM

## 2019-11-05 DIAGNOSIS — M25611 Stiffness of right shoulder, not elsewhere classified: Secondary | ICD-10-CM

## 2019-11-05 DIAGNOSIS — M75121 Complete rotator cuff tear or rupture of right shoulder, not specified as traumatic: Secondary | ICD-10-CM

## 2019-11-05 DIAGNOSIS — M6281 Muscle weakness (generalized): Secondary | ICD-10-CM

## 2019-11-06 ENCOUNTER — Encounter: Payer: Self-pay | Admitting: Physical Therapy

## 2019-11-06 NOTE — Therapy (Signed)
[facility] [address] [place], [place], [address] Phone: [phone]   Fax:  [phone]  Physical Therapy Treatment Physical Therapy Progress Note   Dates of reporting period  10/03/2019  to  11/05/2019    Patient Details  Name: John Hartman MRN: [id number] Date of Birth: 09-14-1971 Referring Provider (PT): Laure Kidney, MD   Encounter Date: 11/05/2019  PT End of Session - 11/06/19 1652    Visit Number  10    Number of Visits  17    Date for PT Re-Evaluation  11/28/19    Authorization - Visit Number  1    Authorization - Number of Visits  10    PT Start Time  4287    PT Stop Time  1741    PT Time Calculation (min)  56 min    Activity Tolerance  Patient tolerated treatment well;Patient limited by pain    Behavior During Therapy  [facility] for tasks assessed/performed       Past Medical History:  Diagnosis Date  . Angioedema   . Bronchitis   . DDD (degenerative disc disease), cervical   . Diabetes mellitus ([place])   . Fatty liver 03/31/2017   [place] April 2018  . GERD (gastroesophageal reflux disease)   . Gout   . Heart attack ([place])   . HTN (hypertension)   . Hypertension    CONTROLLED ON MEDS  . IBS (irritable bowel syndrome)   . LVH (left ventricular hypertrophy) 11/24/2018   Moderate, ECHO  . Myocardial infarction ([place]) 08/23/2018  . Seasonal allergies   . Spinal stenosis     Past Surgical History:  Procedure Laterality Date  . ACDF with fusion  2007  . CARDIAC CATHETERIZATION Left 08/15/2016   Procedure: Left Heart Cath and Coronary Angiography;  Surgeon: Wellington Hampshire, MD;  Location: [place] [facility];  Service: Cardiovascular;  Laterality: Left;  . COLONOSCOPY WITH PROPOFOL N/A 06/26/2015   Procedure: COLONOSCOPY WITH PROPOFOL;  Surgeon: Lucilla Lame, MD;  Location: [place];  Service: Endoscopy;  Laterality: N/A;  with biopsies  . CORONARY/GRAFT ACUTE MI REVASCULARIZATION N/A 08/23/2018   Procedure:  Coronary/Graft Acute MI Revascularization;  Surgeon: Isaias Cowman, MD;  Location: [place] [facility];  Service: Cardiovascular;  Laterality: N/A;  . LEFT HEART CATH AND CORONARY ANGIOGRAPHY N/A 08/23/2018   Procedure: LEFT HEART CATH AND CORONARY ANGIOGRAPHY;  Surgeon: Isaias Cowman, MD;  Location: [place] [facility];  Service: Cardiovascular;  Laterality: N/A;  . NASAL SEPTUM SURGERY  2004  . SHOULDER SURGERY Left 2007  . Testicular torsion  1980s  . VASECTOMY      There were no vitals filed for this visit.  Subjective Assessment - 11/06/19 1650    Subjective  Pt. states he drove to PT today.  Pt. is weaining off the sling with work-related tasks.  Pt. able to use keyboard with on increase shoulder pain/issues. Pt. contiues to wear sling at night.    Pertinent History  pt. known to PT.  Pt. is planning on lumbar fusion once he has completely recovered from shoulder.  Pt. has adhesive allergy from tape.    Patient Stated Goals  Increase R shoulder AROM/ strength/ pain-free mobility.    Currently in Pain?  Yes    Pain Score  4     Pain Location  Shoulder    Pain Orientation  Right    Pain Descriptors / Indicators  Aching  There.ex.:  Standing wall ladder (flexion/ abduction)- 5x each. B UBE 3 min. F/b (AAROM).   Seated R shoulder AAROM with wand (sh. Flexion/ chest press/ ER) 20x Standing R shoulder AAROM with wand (sh. Extension/ IR) 20x Standing wall ladder: shoulder flexion/ abduction 5x (good ROM) Standing B shoulder flexion with ball at stair handrail/ wall 5x each (muscle fatigue/ fasciculations noted) Supine R shoulder A/AROM: flexion/ horizontal abduction 10x each.  Minimal PT assist.    Pt. Will ice at home    PT Long Term Goals - 11/01/19 1749      PT LONG TERM GOAL #1   Title  Pt. I with HEP to increase R shoulder PROM to [facility] as compared to L to progress to AROM/ MD protocol.    Baseline  supine R shoulder AAROM per MD protocol:  flexion 152 deg./ abduction 128 deg./ ER 42 deg. (pain limited)/ IR 64 deg.    Time  4    Period  Weeks    Status  Achieved    Target Date  10/31/19      PT LONG TERM GOAL #2   Title  Pt. will complete FOTO and score norm values to improve R shoulder functional mobility.    Baseline  TBD    Time  4    Period  Weeks    Status  On-going    Target Date  11/28/19      PT LONG TERM GOAL #3   Title  Pt. will complete grip strength testing and score >10% of L to improve grasping/ strengthening.    Baseline  TBD    Time  4    Period  Weeks    Status  On-going    Target Date  11/28/19      PT LONG TERM GOAL #4   Title  Pt. will start R shoulder AROM and demonstrate >90 deg. flexion/ abduction to improve progression of HEP/ daily tasks.    Baseline  supine R shoulder AAROM per MD protocol: flexion 152 deg./ abduction 128 deg./ ER 42 deg. (pain limited)/ IR 64 deg.    Time  4    Period  Weeks    Status  Not Met    Target Date  11/28/19            Plan - 11/06/19 1654    Clinical Impression Statement  (+) R mid. deltoid/ AC joint tenderness with palpation.  Minimal tenderness over anterior deltoid incision.  Pt. progressing to seated/ standing AAROM (all planes) with moderate shoulder muscle weakness noted.  Pt. benefits from mirror feedback to prevent R UT activation.  Pt. motivated and progressing well with A/AROM in supine and standing positions.    Stability/Clinical Decision Making  Evolving/Moderate complexity    Rehab Potential  Good    PT Frequency  2x / week    PT Duration  4 weeks    PT Treatment/Interventions  ADLs/Self Care Home Management;Aquatic Therapy;Cryotherapy;Electrical Stimulation;Traction;Dry needling;Passive range of motion;Manual techniques;Neuromuscular re-education;Patient/family education;Therapeutic exercise;Therapeutic activities;Functional mobility training;Gait training    PT Next Visit Plan  See MD protoocol.  R shoulder AA/PROM (pain tolerable).    PT  Home Exercise Plan  Access Code: [email or web address]   Access Code: [email or web address] (wand ex)    Consulted and Agree with Plan of Care  Patient       Patient will benefit from skilled therapeutic intervention in order to improve the following deficits and impairments:  Pain, Improper body mechanics, Postural dysfunction, Decreased activity tolerance,  Decreased endurance, Decreased range of motion, Decreased strength, Hypomobility, Difficulty walking, Impaired flexibility, Impaired UE functional use  Visit Diagnosis: Nontraumatic complete tear of right rotator cuff  Shoulder joint stiffness, right  Muscle weakness (generalized)  Chronic right shoulder pain     Problem List Patient Active Problem List   Diagnosis Date Noted  . 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 Tachycardia 03/24/2019  . Status post myocardial infarction 11/24/2018  . LVH (left ventricular hypertrophy) 11/24/2018  . 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 NSTEMI (non-ST elevated myocardial infarction) ([place]) 08/22/2018  . Elevated hemoglobin ([place]) 08/11/2017  . Chronic lower back pain 08/04/2017  . Degenerative disc disease, lumbar 08/04/2017  . 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 Calcification of abdominal aorta (HCC) 08/04/2017  . Fatty liver 03/31/2017  . Elevated liver enzymes 02/11/2017  . Serum total bilirubin elevated 02/11/2017  . Hyperlipidemia LDL goal <70 01/04/2017  . Elevated serum glutamic pyruvic transaminase (SGPT) level 01/04/2017  . Angioedema   . Encounter for medication monitoring 08/25/2016  . Coronary artery disease 08/25/2016  . Uncontrolled diabetes mellitus with hyperglycemia ([place])   . Chest pain 08/08/2016  . Preventative health care 08/02/2016  . Ketonuria 07/12/2016  . Glucosuria 07/12/2016  . 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 Palpitations 08/12/2015  . Family history of malignant neoplasm of gastrointestinal tract   . Benign neoplasm of rectosigmoid junction   . Essential hypertension  05/28/2015  . Gout 05/28/2015  . IBS (irritable bowel syndrome) 05/28/2015   Pura Spice, PT, DPT # [phone] 11/06/2019, 4:57 PM  [place] [address] [place], [place], [address] Phone: [phone]   Fax:  [phone]  Name: Bethany Cumming MRN: [id number] Date of Birth: [birth year]

## 2019-11-07 ENCOUNTER — Other Ambulatory Visit: Payer: Self-pay

## 2019-11-07 ENCOUNTER — Ambulatory Visit: Payer: 59 | Admitting: Physical Therapy

## 2019-11-07 DIAGNOSIS — G8929 Other chronic pain: Secondary | ICD-10-CM

## 2019-11-07 DIAGNOSIS — M75121 Complete rotator cuff tear or rupture of right shoulder, not specified as traumatic: Secondary | ICD-10-CM

## 2019-11-07 DIAGNOSIS — M6281 Muscle weakness (generalized): Secondary | ICD-10-CM

## 2019-11-07 DIAGNOSIS — M25611 Stiffness of right shoulder, not elsewhere classified: Secondary | ICD-10-CM

## 2019-11-07 DIAGNOSIS — M25511 Pain in right shoulder: Secondary | ICD-10-CM

## 2019-11-09 ENCOUNTER — Encounter: Payer: Self-pay | Admitting: Physical Therapy

## 2019-11-09 NOTE — Therapy (Signed)
[place] [facility] [address] [place], [place], [address] Phone: [phone]   Fax:  [phone]  Physical Therapy Treatment  Patient Details  Name: John Hartman MRN: [id number] Date of Birth: [birth year] Referring Provider (PT): Laure Kidney, MD   Encounter Date: 11/07/2019  PT End of Session - 11/09/19 1942    Visit Number  11    Number of Visits  17    Date for PT Re-Evaluation  11/28/19    Authorization - Visit Number  2    Authorization - Number of Visits  10    PT Start Time  5400    PT Stop Time  1735    PT Time Calculation (min)  49 min    Activity Tolerance  Patient tolerated treatment well;Patient limited by pain    Behavior During Therapy  [facility] for tasks assessed/performed       Past Medical History:  Diagnosis Date  . Angioedema   . Bronchitis   . DDD (degenerative disc disease), cervical   . Diabetes mellitus ([place])   . Fatty liver 03/31/2017   [place] April 2018  . GERD (gastroesophageal reflux disease)   . Gout   . Heart attack ([place])   . HTN (hypertension)   . Hypertension    CONTROLLED ON MEDS  . IBS (irritable bowel syndrome)   . LVH (left ventricular hypertrophy) 11/24/2018   Moderate, ECHO  . Myocardial infarction ([place]) 08/23/2018  . Seasonal allergies   . Spinal stenosis     Past Surgical History:  Procedure Laterality Date  . ACDF with fusion  2007  . CARDIAC CATHETERIZATION Left 08/15/2016   Procedure: Left Heart Cath and Coronary Angiography;  Surgeon: Wellington Hampshire, MD;  Location: [place] [facility];  Service: Cardiovascular;  Laterality: Left;  . COLONOSCOPY WITH PROPOFOL N/A 06/26/2015   Procedure: COLONOSCOPY WITH PROPOFOL;  Surgeon: Lucilla Lame, MD;  Location: [place];  Service: Endoscopy;  Laterality: N/A;  with biopsies  . CORONARY/GRAFT ACUTE MI REVASCULARIZATION N/A 08/23/2018   Procedure: Coronary/Graft Acute MI Revascularization;  Surgeon: Isaias Cowman, MD;  Location: [place] [facility];  Service: Cardiovascular;  Laterality: N/A;  . LEFT HEART CATH AND CORONARY ANGIOGRAPHY N/A 08/23/2018   Procedure: LEFT HEART CATH AND CORONARY ANGIOGRAPHY;  Surgeon: Isaias Cowman, MD;  Location: [place] [facility];  Service: Cardiovascular;  Laterality: N/A;  . NASAL SEPTUM SURGERY  2004  . SHOULDER SURGERY Left 2007  . Testicular torsion  1980s  . VASECTOMY      There were no vitals filed for this visit.  Subjective Assessment - 11/09/19 1936    Subjective  Pt. has not worn shoulder sling today but will still wear at bedtime.  Pt. states he is getting better and has minimal pain in R sh. at this time.    Pertinent History  pt. known to PT.  Pt. is planning on lumbar fusion once he has completely recovered from shoulder.  Pt. has adhesive allergy from tape.    Patient Stated Goals  Increase R shoulder AROM/ strength/ pain-free mobility.    Currently in Pain?  Yes    Pain Score  3     Pain Location  Shoulder    Pain Orientation  Right    Pain Descriptors / Indicators  Aching       There.ex.:  B UBE 3 min. F/b (AAROM). Standing R shoulder AAROM with wand (chest press/ flexoin/ extension/ IR/ abduction) 20x each (discomfort  with abdcution).   Standing B shoulder flexion with ball at wall 5x each (muscle fatigue noted) Supine R shoulder A/AROM: flexion/ horizontal abduction 10x each.  Minimal PT assist.   Standing RTB scap. Retraction/ tricep extension 20x each.  No issues.  Pt. Will ice at home    PT Long Term Goals - 11/01/19 1749      PT LONG TERM GOAL #1   Title  Pt. I with HEP to increase R shoulder PROM to [facility] as compared to L to progress to AROM/ MD protocol.    Baseline  supine R shoulder AAROM per MD protocol: flexion 152 deg./ abduction 128 deg./ ER 42 deg. (pain limited)/ IR 64 deg.    Time  4    Period  Weeks    Status  Achieved    Target Date  10/31/19      PT LONG TERM GOAL #2   Title  Pt. will complete FOTO and score norm  values to improve R shoulder functional mobility.    Baseline  TBD    Time  4    Period  Weeks    Status  On-going    Target Date  11/28/19      PT LONG TERM GOAL #3   Title  Pt. will complete grip strength testing and score >10% of L to improve grasping/ strengthening.    Baseline  TBD    Time  4    Period  Weeks    Status  On-going    Target Date  11/28/19      PT LONG TERM GOAL #4   Title  Pt. will start R shoulder AROM and demonstrate >90 deg. flexion/ abduction to improve progression of HEP/ daily tasks.    Baseline  supine R shoulder AAROM per MD protocol: flexion 152 deg./ abduction 128 deg./ ER 42 deg. (pain limited)/ IR 64 deg.    Time  4    Period  Weeks    Status  Not Met    Target Date  11/28/19            Plan - 11/09/19 1942    Clinical Impression Statement  Pt. continues to progress with A/AROM (all planes) of movement and remains limited with R sh. ER.  Minimal tenderness with palpation over R anterior deltoid/ AC joint.  Pt. progressing with increase R shoulder strength/ eccentric muscle control during supine AROM (flexion/ horizontal abduction/ adduction).  PT added resisted scap. retraction/ tricep extension with RTB.  No increase issues noted after tx. session.  Pt. instructed to ice R shoulder at home.    Stability/Clinical Decision Making  Evolving/Moderate complexity    Clinical Decision Making  Moderate    Rehab Potential  Good    PT Frequency  2x / week    PT Duration  4 weeks    PT Treatment/Interventions  ADLs/Self Care Home Management;Aquatic Therapy;Cryotherapy;Electrical Stimulation;Traction;Dry needling;Passive range of motion;Manual techniques;Neuromuscular re-education;Patient/family education;Therapeutic exercise;Therapeutic activities;Functional mobility training;Gait training    PT Next Visit Plan  See MD protoocol.  R shoulder AA/PROM (pain tolerable).    PT Home Exercise Plan  Access Code: [email or web address]   Access Code: [email or web address] (wand ex)     Consulted and Agree with Plan of Care  Patient       Patient will benefit from skilled therapeutic intervention in order to improve the following deficits and impairments:  Pain, Improper body mechanics, Postural dysfunction, Decreased activity tolerance, Decreased endurance, Decreased range of motion, Decreased strength, Hypomobility, Difficulty  walking, Impaired flexibility, Impaired UE functional use  Visit Diagnosis: Nontraumatic complete tear of right rotator cuff  Shoulder joint stiffness, right  Muscle weakness (generalized)  Chronic right shoulder pain     Problem List Patient Active Problem List   Diagnosis Date Noted  . 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 Tachycardia 03/24/2019  . Status post myocardial infarction 11/24/2018  . LVH (left ventricular hypertrophy) 11/24/2018  . 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 NSTEMI (non-ST elevated myocardial infarction) ([place]) 08/22/2018  . Elevated hemoglobin ([place]) 08/11/2017  . Chronic lower back pain 08/04/2017  . Degenerative disc disease, lumbar 08/04/2017  . 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 Calcification of abdominal aorta (HCC) 08/04/2017  . Fatty liver 03/31/2017  . Elevated liver enzymes 02/11/2017  . Serum total bilirubin elevated 02/11/2017  . Hyperlipidemia LDL goal <70 01/04/2017  . Elevated serum glutamic pyruvic transaminase (SGPT) level 01/04/2017  . Angioedema   . Encounter for medication monitoring 08/25/2016  . Coronary artery disease 08/25/2016  . Uncontrolled diabetes mellitus with hyperglycemia ([place])   . Chest pain 08/08/2016  . Preventative health care 08/02/2016  . Ketonuria 07/12/2016  . Glucosuria 07/12/2016  . 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 Palpitations 08/12/2015  . Family history of malignant neoplasm of gastrointestinal tract   . Benign neoplasm of rectosigmoid junction   . Essential hypertension 05/28/2015  . Gout 05/28/2015  . IBS (irritable bowel syndrome) 05/28/2015    Pura Spice, PT, DPT # [phone] 11/09/2019, 7:48 PM  [place] [facility] [address] [place], [place], [address] Phone: [phone]   Fax:  [phone]  Name: Kayen Grabel MRN: [id number] Date of Birth: May 31, 1971

## 2019-11-12 ENCOUNTER — Telehealth: Payer: Self-pay

## 2019-11-12 ENCOUNTER — Encounter: Payer: Self-pay | Admitting: Urology

## 2019-11-12 ENCOUNTER — Ambulatory Visit
Admission: RE | Admit: 2019-11-12 | Discharge: 2019-11-12 | Disposition: A | Discharge status: Home / Self Care | Payer: 59 | Source: Ambulatory Visit | Attending: Urology | Admitting: Urology

## 2019-11-12 ENCOUNTER — Other Ambulatory Visit: Payer: Self-pay

## 2019-11-12 ENCOUNTER — Ambulatory Visit: Payer: 59 | Admitting: Physical Therapy

## 2019-11-12 ENCOUNTER — Other Ambulatory Visit
Admission: RE | Admit: 2019-11-12 | Discharge: 2019-11-12 | Disposition: A | Discharge status: Home / Self Care | Payer: 59 | Source: Home / Self Care | Attending: Urology | Admitting: Urology

## 2019-11-12 ENCOUNTER — Ambulatory Visit: Payer: 59 | Admitting: Urology

## 2019-11-12 ENCOUNTER — Ambulatory Visit
Admission: RE | Admit: 2019-11-12 | Discharge: 2019-11-12 | Disposition: A | Discharge status: Home / Self Care | Payer: 59 | Attending: Urology | Admitting: Urology

## 2019-11-12 VITALS — BP 156/101 | HR 68 | Ht 71.0 in | Wt 205.0 lb

## 2019-11-12 DIAGNOSIS — R319 Hematuria, unspecified: Secondary | ICD-10-CM | POA: Insufficient documentation

## 2019-11-12 DIAGNOSIS — G8929 Other chronic pain: Secondary | ICD-10-CM

## 2019-11-12 DIAGNOSIS — R310 Gross hematuria: Secondary | ICD-10-CM | POA: Insufficient documentation

## 2019-11-12 DIAGNOSIS — N200 Calculus of kidney: Secondary | ICD-10-CM | POA: Insufficient documentation

## 2019-11-12 DIAGNOSIS — M25611 Stiffness of right shoulder, not elsewhere classified: Secondary | ICD-10-CM

## 2019-11-12 DIAGNOSIS — M6281 Muscle weakness (generalized): Secondary | ICD-10-CM

## 2019-11-12 DIAGNOSIS — M75121 Complete rotator cuff tear or rupture of right shoulder, not specified as traumatic: Secondary | ICD-10-CM

## 2019-11-12 LAB — URINALYSIS, COMPLETE (UACMP) WITH MICROSCOPIC
Bacteria, UA: NONE SEEN
RBC / HPF: >50 RBC/hpf (ref 0–5)
Squamous Epithelial / HPF: NONE SEEN (ref 0–5)

## 2019-11-12 MED ORDER — NYSTATIN-TRIAMCINOLONE 100000-0.1 UNIT/GM-% EX OINT: 1.0000 "application " | TOPICAL_OINTMENT | Freq: Two times a day (BID) | CUTANEOUS | 0 refills | Status: DC

## 2019-11-12 NOTE — Progress Notes (Signed)
   11/12/2019 11:51 AM   John Hartman [birth year] [id number]  Reason for visit: Gross hematuria  HPI: I saw [name] in urology clinic today as an add-on for gross hematuria.  I last saw him in July 2020 for BPH and non-obstructive nephrolithiasis with a left lower pole 8 mm stone.  We planned for follow-up in 6 months with KUB for surveillance.  He reports acute onset of gross hematuria with bright red urine starting yesterday.  His urine continues to be bloody.  He denies any blood clots in the urine.  He denies any urinary symptoms of dysuria, urgency, frequency.  He denies any flank pain, fevers, or chills.  Urinalysis today shows greater than 50 RBCs, 6-10 WBCs, no bacteria, no squamous cells.  He was previously on Brilinta for his cardiac stent, but has completed his duration of anticoagulation.  He recently underwent shoulder surgery in October and has been undergoing physical therapy for this.  He is unsure if the catheter was placed during the surgery.  He also reports a small rash on his penis that he noted about a month ago that is irritated with intercourse.  On exam, there is a small 2 cm rash at the right dorsal aspect of the glans and distal shaft consistent with likely fungal infection.  We discussed the need to push fluids with his gross hematuria.  Return precautions would include fever over 101 or inability to urinate.  Finally, we discussed possible etiologies including ureteral stone, prostatic bleeding, bladder tumor, or urethral stricture.   Mycolog cream twice daily x2 weeks for likely fungal infection of glans KUB today to evaluate for ureteral stone, call with results Cystoscopy in 1 week to rule out bladder pathology  A total of 15 minutes were spent face-to-face with the patient, greater than 50% was spent in patient education, counseling, and coordination of care regarding gross hematuria and penile rash.  Billey Co, [facility]  [address], [place], [place] [address] [phone]

## 2019-11-12 NOTE — Telephone Encounter (Signed)
Called pt informed him of the need to give urine sample at the [facility] lab before coming to his 11am appt today. Pt gave verbal understanding.

## 2019-11-12 NOTE — Addendum Note (Signed)
Addended by: Gordy Clement C on: 11/12/2019 12:00 PM   Modules accepted: Orders

## 2019-11-13 ENCOUNTER — Telehealth: Payer: Self-pay | Admitting: Urology

## 2019-11-13 ENCOUNTER — Other Ambulatory Visit: Payer: Self-pay | Admitting: Family Medicine

## 2019-11-13 LAB — URINE CULTURE: Culture: NO GROWTH

## 2019-11-13 NOTE — Telephone Encounter (Signed)
Called pt informed him of information below, per [place]. Pt gave verbal understanding.

## 2019-11-13 NOTE — Telephone Encounter (Signed)
Pt LMOM and wants a call back for Xray results

## 2019-11-13 NOTE — Telephone Encounter (Signed)
Requested medication (s) are due for refill today: yes  Requested medication (s) are on the active medication list: yes  Last refill:  08/25/2019  Future visit scheduled: no  Notes to clinic:  Last filled by Dr.Lada Review for refill   Requested Prescriptions  Pending Prescriptions Disp Refills   ezetimibe (ZETIA) 10 MG tablet [Pharmacy Med Name: EZETIMIBE 10 MG TABLET] 90 tablet 3    Sig: TAKE 1 TABLET BY MOUTH EVERY DAY     Cardiovascular:  Antilipid - Sterol Transport Inhibitors Failed - 11/13/2019  2:21 PM      Failed - HDL in normal range and within 360 days    HDL  Date Value Ref Range Status  03/28/2019 25 (L) > OR = 40 mg/dL Final         Passed - Total Cholesterol in normal range and within 360 days    Cholesterol  Date Value Ref Range Status  03/28/2019 103 <200 mg/dL Final         Passed - LDL in normal range and within 360 days    LDL Cholesterol (Calc)  Date Value Ref Range Status  03/28/2019 58 <100 mg/dL (calc) Final    Comment:    . Desirable range <100 mg/dL for primary prevention; <70 mg/dL for patients with CHD or diabetic patients with > or = 2 CHD risk factors. Marland Kitchen LDL-C is now calculated using the Martin-Hopkins calculation, which is a validated novel method providing better accuracy than the Friedewald equation in the estimation of LDL-C. Cresenciano Genre et al. Annamaria Helling. MU:[id number]): 2061-2068 . For additional information, please refer to http://education.[employer].com/faq/FAQ164 (This link is being provided for informational/educational purposes only.)          Passed - Triglycerides in normal range and within 360 days    Triglycerides  Date Value Ref Range Status  03/28/2019 116 <150 mg/dL Final         Passed - Valid encounter within last 12 months    Recent Outpatient Visits          7 months ago Hyperbilirubinemia   [place], Satira Anis, MD   1 year ago Coronary artery disease involving native coronary  artery of native heart without angina pectoris   [place], Satira Anis, MD   1 year ago Hospital discharge follow-up   [place], NP   1 year ago Bronchitis with bronchospasm   [place], FNP   2 years ago Chronic midline low back pain without sciatica   [place], Satira Anis, MD      Future Appointments            In 2 months Diamantina [place], Herbert Seta, [place]

## 2019-11-13 NOTE — Telephone Encounter (Signed)
-----   Message from Billey Co, MD sent at 11/13/2019  2:15 PM EST ----- No stones seen over ureters on KUB, he should keep follow up for cysto. Encourage him to push fluids to clear hematuria   Thx Aaron Edelman

## 2019-11-14 ENCOUNTER — Encounter: Payer: Self-pay | Admitting: Physical Therapy

## 2019-11-14 ENCOUNTER — Other Ambulatory Visit: Payer: Self-pay

## 2019-11-14 ENCOUNTER — Ambulatory Visit: Payer: 59 | Admitting: Physical Therapy

## 2019-11-14 DIAGNOSIS — M75121 Complete rotator cuff tear or rupture of right shoulder, not specified as traumatic: Secondary | ICD-10-CM | POA: Diagnosis not present

## 2019-11-14 DIAGNOSIS — G8929 Other chronic pain: Secondary | ICD-10-CM

## 2019-11-14 DIAGNOSIS — M25611 Stiffness of right shoulder, not elsewhere classified: Secondary | ICD-10-CM

## 2019-11-14 DIAGNOSIS — M6281 Muscle weakness (generalized): Secondary | ICD-10-CM

## 2019-11-14 DIAGNOSIS — M25511 Pain in right shoulder: Secondary | ICD-10-CM

## 2019-11-14 NOTE — Patient Instructions (Signed)
Access Code: [email or web address]  URL: [email or web address]  Date: 11/14/2019  Prepared by: Dorcas Carrow   Exercises  Isometric Shoulder Flexion at [place] - 10 reps - 1 sets - 10 second hold - 1x daily - 4x weekly  Isometric Shoulder Abduction at Wall - 10 reps - 1 sets - 10 second hold - 1x daily - 4x weekly  Isometric Shoulder Extension at Wall - 10 reps - 1 sets - 10 second hold - 1x daily - 4x weekly  Standing Single Arm Elbow Flexion with Resistance - 15 reps - 2 sets - 1x daily - 4x weekly  Scapular Retraction with Resistance - 15 reps - 2 sets - 1x daily - 4x weekly  Standing Tricep Extensions with Resistance - 15 reps - 2 sets - 1x daily - 4x weekly

## 2019-11-15 ENCOUNTER — Encounter: Payer: Self-pay | Admitting: Physical Therapy

## 2019-11-15 NOTE — Therapy (Signed)
[place] [facility] [address] [place], [place], [address] Phone: [phone]   Fax:  [phone]  Physical Therapy Treatment  Patient Details  Name: John Hartman MRN: [id number] Date of Birth: 21-Sep-1971 Referring Provider (PT): Laure Kidney, MD   Encounter Date: 11/12/2019  PT End of Session - 11/15/19 1457    Visit Number  12    Number of Visits  17    Date for PT Re-Evaluation  11/28/19    Authorization - Visit Number  3    Authorization - Number of Visits  10    PT Start Time  7253    PT Stop Time  6644    PT Time Calculation (min)  47 min    Activity Tolerance  Patient tolerated treatment well;Patient limited by pain    Behavior During Therapy  [facility] for tasks assessed/performed       Past Medical History:  Diagnosis Date  . Angioedema   . Bronchitis   . DDD (degenerative disc disease), cervical   . Diabetes mellitus ([place])   . Fatty liver 03/31/2017   [place] April 2018  . GERD (gastroesophageal reflux disease)   . Gout   . Heart attack ([place])   . HTN (hypertension)   . Hypertension    CONTROLLED ON MEDS  . IBS (irritable bowel syndrome)   . Kidney stone   . LVH (left ventricular hypertrophy) 11/24/2018   Moderate, ECHO  . Myocardial infarction ([place]) 08/23/2018  . Seasonal allergies   . Spinal stenosis     Past Surgical History:  Procedure Laterality Date  . ACDF with fusion  2007  . CARDIAC CATHETERIZATION Left 08/15/2016   Procedure: Left Heart Cath and Coronary Angiography;  Surgeon: Wellington Hampshire, MD;  Location: [place] [facility];  Service: Cardiovascular;  Laterality: Left;  . COLONOSCOPY WITH PROPOFOL N/A 06/26/2015   Procedure: COLONOSCOPY WITH PROPOFOL;  Surgeon: Lucilla Lame, MD;  Location: [place];  Service: Endoscopy;  Laterality: N/A;  with biopsies  . CORONARY/GRAFT ACUTE MI REVASCULARIZATION N/A 08/23/2018   Procedure: Coronary/Graft Acute MI Revascularization;  Surgeon: Isaias Cowman, MD;   Location: [place] [facility];  Service: Cardiovascular;  Laterality: N/A;  . LEFT HEART CATH AND CORONARY ANGIOGRAPHY N/A 08/23/2018   Procedure: LEFT HEART CATH AND CORONARY ANGIOGRAPHY;  Surgeon: Isaias Cowman, MD;  Location: [place] [facility];  Service: Cardiovascular;  Laterality: N/A;  . NASAL SEPTUM SURGERY  2004  . SHOULDER SURGERY Left 2007  . Testicular torsion  1980s  . VASECTOMY      There were no vitals filed for this visit.  Subjective Assessment - 11/14/19 1806    Subjective  Pt. reports he has been able to sleep without sling at night.  No c/o pain reported at this time.  Pt. reports feeling stiff in R sh.  Pt. dealing with a kidney stone and blood in urine right now.  Pt. has seen Urologist today.    Pertinent History  pt. known to PT.  Pt. is planning on lumbar fusion once he has completely recovered from shoulder.  Pt. has adhesive allergy from tape.    Patient Stated Goals  Increase R shoulder AROM/ strength/ pain-free mobility.    Currently in Pain?  No/denies          There.ex.:   Discussed HEP while on B UBE 3 min. F/b Standing ball up/down wall 10x with static holds in flexion Nautilus:  30# lat. Pull downs/ 20#  tricep extension/ 20# sh. Adduction with handles/ 20# sh. Extension/ 30# scap. Retraction 25x Seated/ standing R sh. A/AROM (all planes) as tolerated.   Pt. Will ice R shoulder at home.    PT Long Term Goals - 11/01/19 1749      PT LONG TERM GOAL #1   Title  Pt. I with HEP to increase R shoulder PROM to [facility] as compared to L to progress to AROM/ MD protocol.    Baseline  supine R shoulder AAROM per MD protocol: flexion 152 deg./ abduction 128 deg./ ER 42 deg. (pain limited)/ IR 64 deg.    Time  4    Period  Weeks    Status  Achieved    Target Date  10/31/19      PT LONG TERM GOAL #2   Title  Pt. will complete FOTO and score norm values to improve R shoulder functional mobility.    Baseline  TBD    Time  4    Period  Weeks     Status  On-going    Target Date  11/28/19      PT LONG TERM GOAL #3   Title  Pt. will complete grip strength testing and score >10% of L to improve grasping/ strengthening.    Baseline  TBD    Time  4    Period  Weeks    Status  On-going    Target Date  11/28/19      PT LONG TERM GOAL #4   Title  Pt. will start R shoulder AROM and demonstrate >90 deg. flexion/ abduction to improve progression of HEP/ daily tasks.    Baseline  supine R shoulder AAROM per MD protocol: flexion 152 deg./ abduction 128 deg./ ER 42 deg. (pain limited)/ IR 64 deg.    Time  4    Period  Weeks    Status  Not Met    Target Date  11/28/19            Plan - 11/15/19 1458    Clinical Impression Statement  Pt. working towards increasing R sh. AROM in all planes of movement.  Pt. has shown slow but consistent progress with resisted ther.ex./ isometrics.  PT will issue a new HEP next tx. session if no increase c/o pain or issues after tx. session today.    Stability/Clinical Decision Making  Evolving/Moderate complexity    Clinical Decision Making  Moderate    Rehab Potential  Good    PT Frequency  2x / week    PT Duration  4 weeks    PT Treatment/Interventions  ADLs/Self Care Home Management;Aquatic Therapy;Cryotherapy;Electrical Stimulation;Traction;Dry needling;Passive range of motion;Manual techniques;Neuromuscular re-education;Patient/family education;Therapeutic exercise;Therapeutic activities;Functional mobility training;Gait training    PT Next Visit Plan  Issue new HEP/ isometrics/ resisted ther.ex.    PT Home Exercise Plan  Access Code: [email or web address]   Access Code: [email or web address] (wand ex)    Consulted and Agree with Plan of Care  Patient       Patient will benefit from skilled therapeutic intervention in order to improve the following deficits and impairments:  Pain, Improper body mechanics, Postural dysfunction, Decreased activity tolerance, Decreased endurance, Decreased range of motion, Decreased  strength, Hypomobility, Difficulty walking, Impaired flexibility, Impaired UE functional use  Visit Diagnosis: Nontraumatic complete tear of right rotator cuff  Shoulder joint stiffness, right  Muscle weakness (generalized)  Chronic right shoulder pain     Problem List Patient Active Problem List   Diagnosis Date Noted  .  Arthritis of right acromioclavicular joint 09/23/2019  . Biceps tendonitis, unspecified laterality 09/23/2019  . Bursitis of right shoulder 09/23/2019  . Incomplete tear of right rotator cuff 09/23/2019  . 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 Tachycardia 03/24/2019  . Status post myocardial infarction 11/24/2018  . LVH (left ventricular hypertrophy) 11/24/2018  . 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 History of angioedema 09/01/2018  . NSTEMI (non-ST elevated myocardial infarction) ([place]) 08/22/2018  . Elevated hemoglobin ([place]) 08/11/2017  . Chronic lower back pain 08/04/2017  . Degenerative disc disease, lumbar 08/04/2017  . 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 Calcification of abdominal aorta (HCC) 08/04/2017  . Fatty liver 03/31/2017  . Elevated liver enzymes 02/11/2017  . Serum total bilirubin elevated 02/11/2017  . Hyperlipidemia LDL goal <70 01/04/2017  . Elevated serum glutamic pyruvic transaminase (SGPT) level 01/04/2017  . Angioedema   . Encounter for medication monitoring 08/25/2016  . Coronary artery disease 08/25/2016  . Uncontrolled diabetes mellitus with hyperglycemia ([place])   . Chest pain 08/08/2016  . Preventative health care 08/02/2016  . Ketonuria 07/12/2016  . Glucosuria 07/12/2016  . 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 Palpitations 08/12/2015  . Family history of malignant neoplasm of gastrointestinal tract   . Benign neoplasm of rectosigmoid junction   . Essential hypertension 05/28/2015  . Gout 05/28/2015  . IBS (irritable bowel syndrome) 05/28/2015   Pura Spice, PT, DPT # [phone] 11/15/2019, 3:13 PM  [place] [facility] [address] [place], [place], [address] Phone: [phone]   Fax:  [phone]  Name: Gradyn Shein MRN: [id number] Date of Birth: [birth year]

## 2019-11-16 NOTE — Therapy (Signed)
[facility] [address] [place], [place], [address] Phone: [phone]   Fax:  [phone]  Physical Therapy Treatment  Patient Details  Name: John Hartman MRN: [id number] Date of Birth: 01-27-1971 Referring Provider (PT): Laure Kidney, MD   Encounter Date: 11/14/2019  PT End of Session - 11/16/19 0946    Visit Number  13    Number of Visits  17    Date for PT Re-Evaluation  11/28/19    Authorization - Visit Number  4    Authorization - Number of Visits  10    PT Start Time  2035    PT Stop Time  5974    PT Time Calculation (min)  52 min    Activity Tolerance  Patient tolerated treatment well;Patient limited by pain    Behavior During Therapy  [facility] for tasks assessed/performed       Past Medical History:  Diagnosis Date  . Angioedema   . Bronchitis   . DDD (degenerative disc disease), cervical   . Diabetes mellitus ([place])   . Fatty liver 03/31/2017   [place] April 2018  . GERD (gastroesophageal reflux disease)   . Gout   . Heart attack ([place])   . HTN (hypertension)   . Hypertension    CONTROLLED ON MEDS  . IBS (irritable bowel syndrome)   . Kidney stone   . LVH (left ventricular hypertrophy) 11/24/2018   Moderate, ECHO  . Myocardial infarction ([place]) 08/23/2018  . Seasonal allergies   . Spinal stenosis     Past Surgical History:  Procedure Laterality Date  . ACDF with fusion  2007  . CARDIAC CATHETERIZATION Left 08/15/2016   Procedure: Left Heart Cath and Coronary Angiography;  Surgeon: Wellington Hampshire, MD;  Location: [place] [facility];  Service: Cardiovascular;  Laterality: Left;  . COLONOSCOPY WITH PROPOFOL N/A 06/26/2015   Procedure: COLONOSCOPY WITH PROPOFOL;  Surgeon: Lucilla Lame, MD;  Location: [place];  Service: Endoscopy;  Laterality: N/A;  with biopsies  . CORONARY/GRAFT ACUTE MI REVASCULARIZATION N/A 08/23/2018   Procedure: Coronary/Graft Acute MI Revascularization;  Surgeon: Isaias Cowman, MD;   Location: [place] [facility];  Service: Cardiovascular;  Laterality: N/A;  . LEFT HEART CATH AND CORONARY ANGIOGRAPHY N/A 08/23/2018   Procedure: LEFT HEART CATH AND CORONARY ANGIOGRAPHY;  Surgeon: Isaias Cowman, MD;  Location: [place] [facility];  Service: Cardiovascular;  Laterality: N/A;  . NASAL SEPTUM SURGERY  2004  . SHOULDER SURGERY Left 2007  . Testicular torsion  1980s  . VASECTOMY      There were no vitals filed for this visit.  Subjective Assessment - 11/15/19 1954    Subjective  Pt. reports a couple days of R shoulder soreness/ stiffness since last tx. session.  Pt. continues to sleep without use of sling at night.    Pertinent History  pt. known to PT.  Pt. is planning on lumbar fusion once he has completely recovered from shoulder.  Pt. has adhesive allergy from tape.    Patient Stated Goals  Increase R shoulder AROM/ strength/ pain-free mobility.    Currently in Pain?  Yes    Pain Score  3     Pain Location  Shoulder    Pain Orientation  Right    Pain Descriptors / Indicators  Aching        There.ex.:   Standing ball up/down wall 10x with static holds in flexion Nautilus:  30# lat. Pull downs/ 20# tricep extension/  20# sh. Adduction with handles/ 30# scap. Retraction 25x Seated/ standing R sh. A/AROM (all planes) as tolerated.  B UBE 3 min. F/b (consistent cadence)- no increase pain See new HEP (isometrics/ resisted ex.)  Pt. Will ice R shoulder at home.      PT Education - 11/16/19 0945    Education provided  Yes    Education Details  See HEP    Person(s) Educated  Patient    Methods  Explanation;Demonstration;Handout    Comprehension  Verbalized understanding;Returned demonstration          PT Long Term Goals - 11/01/19 1749      PT LONG TERM GOAL #1   Title  Pt. I with HEP to increase R shoulder PROM to [facility] as compared to L to progress to AROM/ MD protocol.    Baseline  supine R shoulder AAROM per MD protocol: flexion 152 deg./  abduction 128 deg./ ER 42 deg. (pain limited)/ IR 64 deg.    Time  4    Period  Weeks    Status  Achieved    Target Date  10/31/19      PT LONG TERM GOAL #2   Title  Pt. will complete FOTO and score norm values to improve R shoulder functional mobility.    Baseline  TBD    Time  4    Period  Weeks    Status  On-going    Target Date  11/28/19      PT LONG TERM GOAL #3   Title  Pt. will complete grip strength testing and score >10% of L to improve grasping/ strengthening.    Baseline  TBD    Time  4    Period  Weeks    Status  On-going    Target Date  11/28/19      PT LONG TERM GOAL #4   Title  Pt. will start R shoulder AROM and demonstrate >90 deg. flexion/ abduction to improve progression of HEP/ daily tasks.    Baseline  supine R shoulder AAROM per MD protocol: flexion 152 deg./ abduction 128 deg./ ER 42 deg. (pain limited)/ IR 64 deg.    Time  4    Period  Weeks    Status  Not Met    Target Date  11/28/19            Plan - 11/16/19 0946    Clinical Impression Statement  Pt. continues to progress with R shoulder AROM/ cuing to decrease R UT activation with overhead and forward reaching.  Pt. cued to avoid pain in R sh. during isometrics.  Pt. issued new HEP to be performed every other or every 3rd day to increase muscle strengthening.    Stability/Clinical Decision Making  Evolving/Moderate complexity    Clinical Decision Making  Moderate    Rehab Potential  Good    PT Frequency  2x / week    PT Duration  4 weeks    PT Treatment/Interventions  ADLs/Self Care Home Management;Aquatic Therapy;Cryotherapy;Electrical Stimulation;Traction;Dry needling;Passive range of motion;Manual techniques;Neuromuscular re-education;Patient/family education;Therapeutic exercise;Therapeutic activities;Functional mobility training;Gait training    PT Next Visit Plan  Issue new HEP/ isometrics/ resisted ther.ex.    PT Home Exercise Plan  Access Code: [email or web address]   Access Code: [email or web address] (wand  ex).  Access Code: [email or web address] (strengthening).    Consulted and Agree with Plan of Care  Patient       Patient will benefit from skilled therapeutic intervention in order to improve the following  deficits and impairments:  Pain, Improper body mechanics, Postural dysfunction, Decreased activity tolerance, Decreased endurance, Decreased range of motion, Decreased strength, Hypomobility, Difficulty walking, Impaired flexibility, Impaired UE functional use  Visit Diagnosis: Nontraumatic complete tear of right rotator cuff  Shoulder joint stiffness, right  Muscle weakness (generalized)  Chronic right shoulder pain     Problem List Patient Active Problem List   Diagnosis Date Noted  . Arthritis of right acromioclavicular joint 09/23/2019  . Biceps tendonitis, unspecified laterality 09/23/2019  . Bursitis of right shoulder 09/23/2019  . Incomplete tear of right rotator cuff 09/23/2019  . 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 Tachycardia 03/24/2019  . Status post myocardial infarction 11/24/2018  . LVH (left ventricular hypertrophy) 11/24/2018  . 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 History of angioedema 09/01/2018  . NSTEMI (non-ST elevated myocardial infarction) ([place]) 08/22/2018  . Elevated hemoglobin ([place]) 08/11/2017  . Chronic lower back pain 08/04/2017  . Degenerative disc disease, lumbar 08/04/2017  . 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 Calcification of abdominal aorta (HCC) 08/04/2017  . Fatty liver 03/31/2017  . Elevated liver enzymes 02/11/2017  . Serum total bilirubin elevated 02/11/2017  . Hyperlipidemia LDL goal <70 01/04/2017  . Elevated serum glutamic pyruvic transaminase (SGPT) level 01/04/2017  . Angioedema   . Encounter for medication monitoring 08/25/2016  . Coronary artery disease 08/25/2016  . Uncontrolled diabetes mellitus with hyperglycemia ([place])   . Chest pain 08/08/2016  . Preventative health care 08/02/2016  . Ketonuria 07/12/2016  . Glucosuria  07/12/2016  . 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 Palpitations 08/12/2015  . Family history of malignant neoplasm of gastrointestinal tract   . Benign neoplasm of rectosigmoid junction   . Essential hypertension 05/28/2015  . Gout 05/28/2015  . IBS (irritable bowel syndrome) 05/28/2015   Pura Spice, PT, DPT # [id number] 11/16/2019, 9:52 AM   [facility] [address] [place], [place], [address] Phone: [phone]   Fax:  [phone]  Name: Lewie Deman MRN: [id number] Date of Birth: 06/10/1971

## 2019-11-19 ENCOUNTER — Other Ambulatory Visit: Payer: Self-pay | Admitting: Radiology

## 2019-11-19 ENCOUNTER — Ambulatory Visit: Payer: 59 | Admitting: Urology

## 2019-11-19 ENCOUNTER — Other Ambulatory Visit
Admission: RE | Admit: 2019-11-19 | Discharge: 2019-11-19 | Disposition: A | Discharge status: Home / Self Care | Payer: 59 | Source: Ambulatory Visit | Attending: Urology | Admitting: Urology

## 2019-11-19 ENCOUNTER — Encounter: Payer: 59 | Admitting: Physical Therapy

## 2019-11-19 ENCOUNTER — Other Ambulatory Visit: Payer: Self-pay

## 2019-11-19 ENCOUNTER — Encounter: Payer: Self-pay | Admitting: Urology

## 2019-11-19 ENCOUNTER — Other Ambulatory Visit
Admission: RE | Admit: 2019-11-19 | Discharge: 2019-11-19 | Disposition: A | Discharge status: Home / Self Care | Payer: 59 | Attending: Urology | Admitting: Urology

## 2019-11-19 VITALS — BP 136/93 | HR 75 | Ht 71.0 in | Wt 200.0 lb

## 2019-11-19 DIAGNOSIS — R310 Gross hematuria: Secondary | ICD-10-CM | POA: Diagnosis not present

## 2019-11-19 DIAGNOSIS — Z20828 Contact with and (suspected) exposure to other viral communicable diseases: Secondary | ICD-10-CM | POA: Insufficient documentation

## 2019-11-19 DIAGNOSIS — N329 Bladder disorder, unspecified: Secondary | ICD-10-CM

## 2019-11-19 DIAGNOSIS — N200 Calculus of kidney: Secondary | ICD-10-CM

## 2019-11-19 DIAGNOSIS — R319 Hematuria, unspecified: Secondary | ICD-10-CM

## 2019-11-19 LAB — URINALYSIS, DIPSTICK ONLY
Bilirubin Urine: NEGATIVE
Glucose, UA: >1000 mg/dL — AB
Leukocytes,Ua: NEGATIVE
Nitrite: NEGATIVE
Protein, ur: NEGATIVE mg/dL
Specific Gravity, Urine: 1.015 (ref 1.005–1.030)
pH: 5 (ref 5.0–8.0)

## 2019-11-19 NOTE — Progress Notes (Signed)
Cystoscopy Procedure Note:  Indication: Gross hematuria  After informed consent and discussion of the procedure and its risks, Kallan Coatney was positioned and prepped in the standard fashion. Cystoscopy was performed with a flexible cystoscope. The urethra, bladder neck and entire bladder was visualized in a standard fashion. The prostate was moderate in size. The ureteral orifices were visualized in their normal location and orientation.  On retroflexion there was a 1 cm papillary/erythematous area at the bladder neck.  No other mucosal lesions seen.  Imaging: CT abdomen pelvis with contrast from March 2020 shows an 8 mm left lower pole stone, but no renal masses  Findings: 1 cm papillary/erythematous area at bladder neck concerning for possible small bladder tumor  He would like to have his left-sided 8 mm nonobstructing stone treated at the same time as bladder biopsy which is very reasonable.  Assessment and Plan: We will schedule for the OR for cystoscopy, bladder biopsy, possible gemcitabine instillation, bilateral retrograde pyelograms, left ureteroscopy, laser lithotripsy, stent placement.  We discussed the risks and benefits of surgery at length, need for further tissue diagnosis before deciding any treatment strategies.  Nickolas Madrid, MD 11/19/2019

## 2019-11-19 NOTE — Patient Instructions (Signed)
Ureteroscopy °Ureteroscopy is a procedure to check for and treat problems inside part of the urinary tract. In this procedure, a thin, tube-shaped instrument with a light at the end (ureteroscope) is used to look at the inside of the kidneys and the ureters, which are the tubes that carry urine from the kidneys to the bladder. The ureteroscope is inserted into one or both of the ureters. °You may need this procedure if you have frequent urinary tract infections (UTIs), blood in your urine, or a stone in one of your ureters. A ureteroscopy can be done to find the cause of urine blockage in a ureter and to evaluate other abnormalities inside the ureters or kidneys. If stones are found, they can be removed during the procedure. Polyps, abnormal tissue, and some types of tumors can also be removed or treated. The ureteroscope may also have a tool to remove tissue to be checked for disease under a microscope (biopsy). °Tell a health care provider about: °· Any allergies you have. °· All medicines you are taking, including vitamins, herbs, eye drops, creams, and over-the-counter medicines. °· Any problems you or family members have had with anesthetic medicines. °· Any blood disorders you have. °· Any surgeries you have had. °· Any medical conditions you have. °· Whether you are pregnant or may be pregnant. °What are the risks? °Generally, this is a safe procedure. However, problems may occur, including: °· Bleeding. °· Infection. °· Allergic reactions to medicines. °· Scarring that narrows the ureter (stricture). °· Creating a hole in the ureter (perforation). °What happens before the procedure? °Staying hydrated °Follow instructions from your health care provider about hydration, which may include: °· Up to 2 hours before the procedure - you may continue to drink clear liquids, such as water, clear fruit juice, black coffee, and plain tea. °Eating and drinking restrictions °Follow instructions from your health care  provider about eating and drinking, which may include: °· 8 hours before the procedure - stop eating heavy meals or foods such as meat, fried foods, or fatty foods. °· 6 hours before the procedure - stop eating light meals or foods, such as toast or cereal. °· 6 hours before the procedure - stop drinking milk or drinks that contain milk. °· 2 hours before the procedure - stop drinking clear liquids. °Medicines °· Ask your health care provider about: °? Changing or stopping your regular medicines. This is especially important if you are taking diabetes medicines or blood thinners. °? Taking medicines such as aspirin and ibuprofen. These medicines can thin your blood. Do not take these medicines before your procedure if your health care provider instructs you not to. °· You may be given antibiotic medicine to help prevent infection. °General instructions °· You may have a urine sample taken to check for infection. °· Plan to have someone take you home from the hospital or clinic. °What happens during the procedure? ° °· To reduce your risk of infection: °? Your health care team will wash or sanitize their hands. °? Your skin will be washed with soap. °· An IV tube will be inserted into one of your veins. °· You will be given one of the following: °? A medicine to help you relax (sedative). °? A medicine to make you fall asleep (general anesthetic). °? A medicine that is injected into your spine to numb the area below and slightly above the injection site (spinal anesthetic). °· To lower your risk of infection, you may be given an antibiotic medicine   by an injection or through the IV tube.  The opening from which you urinate (urethra) will be cleaned with a germ-killing solution.  The ureteroscope will be passed through your urethra into your bladder.  A salt-water solution will flow through the ureteroscope to fill your bladder. This will help the health care provider see the openings of your ureters more  clearly.  Then, the ureteroscope will be passed into your ureter. ? If a growth is found, a piece of it may be removed so it can be examined under a microscope (biopsy). ? If a stone is found, it may be removed through the ureteroscope, or the stone may be broken up using a laser, shock waves, or electrical energy. ? In some cases, if the ureter is too small, a tube may be inserted that keeps the ureter open (ureteral stent). The stent may be left in place for 1 or 2 weeks to keep the ureter open, and then the ureteroscopy procedure will be performed.  The scope will be removed, and your bladder will be emptied. The procedure may vary among health care providers and hospitals. What happens after the procedure?  Your blood pressure, heart rate, breathing rate, and blood oxygen level will be monitored until the medicines you were given have worn off.  You may be asked to urinate.  Donot drive for 24 hours if you were given a sedative. This information is not intended to replace advice given to you by your health care provider. Make sure you discuss any questions you have with your health care provider. Document Released: 12/17/2013 Document Revised: 11/24/2017 Document Reviewed: 09/23/2016 Elsevier Patient Education  2020 [place]. Bladder Biopsy  A bladder biopsy is a procedure to remove a small sample of tissue from the bladder. The procedure is done so that the tissue can be examined under a microscope. You may have a bladder biopsy to diagnose or rule out bladder cancer. During your bladder biopsy, your health care provider may insert a long, thin scope with a tiny lighted camera (cystoscope) into the tube that leads from your bladder to the outside of your body (urethra) and move it into your bladder. The cystoscope will allow your health care provider to examine the lining of the urethra and bladder and remove the tissue sample. If your health provider also needs to examine the tubes  that connect the kidneys to the bladder (ureters), a longer tube (ureteroscope) may be used. Tell a health care provider about:  Any allergies you have.  All medicines you are taking, including vitamins, herbs, eye drops, creams, and over-the-counter medicines.  Any problems you or family members have had with anesthetic medicines.  Any blood disorders you have.  Any surgeries you have had.  Any medical conditions you have.  Whether you are pregnant or may be pregnant. What are the risks? Generally, this is a safe procedure. However, problems may occur, including:  Unusual bleeding.  Infection, especially a urinary tract infection (UTI).  Allergic reactions to medicines.  Damage to the surrounding organs, including the urethra, bladder, or ureters.  Abdominal pain.  Burning or pain during urination.  Narrowing of the urethra due to scar tissue.  Difficulty urinating due to swelling. What happens before the procedure?  You may be given antibiotic medicine to help prevent infection.  Ask your health care provider about: ? Changing or stopping your regular medicines. This is especially important if you are taking diabetes medicines or blood thinners. ? Taking medicines such  as aspirin and ibuprofen. These medicines can thin your blood. Do not take these medicines before your procedure if your health care provider instructs you not to.  Follow instructions from your health care provider about eating and drinking restrictions.  You may be asked to drink plenty of fluids.  You may be asked to urinate right before the procedure. You may have a urine sample taken for UTI testing.  Plan to have someone take you home from the hospital or clinic.  If you will be going home right after the procedure, plan to have someone with you for 24 hours. What happens during the procedure?  To lower your risk of infection: ? Your health care team will wash or sanitize their  hands. ? Your skin will be washed with soap.  An IV tube may be inserted into one of your veins.  You may be given one or more of the following: ? A medicine to help you relax (sedative). ? A medicine to numb the opening of the urethra (local anesthetic). ? A medicine to make you fall asleep (general anesthetic). ? A medicine that is injected into your spine to numb the area below and slightly above the injection site (spinal anesthetic). ? A medicine that is injected into an area of your body to numb everything below the injection site (regional anesthetic).  You will lie on your back with your knees bent and spread apart.  The cystoscope or ureteroscope will be inserted into your urethra and guided into your bladder or ureters.  Your bladder may be slowly filled with germ-free (sterile) water. This will make it easier for your health care provider to view the wall or lining of your bladder.  Small instruments will be inserted through the scope to collect a small tissue sample that will be examined under a microscope. The procedure may vary among health care providers and hospitals. What happens after the procedure?  You may be asked to empty your bladder, or your bladder may be emptied for you.  Your blood pressure, heart rate, breathing rate, and blood oxygen level will be monitored until the medicines you were given wear off.  Do not drive for 24 hours if you received a sedative. This information is not intended to replace advice given to you by your health care provider. Make sure you discuss any questions you have with your health care provider. Document Released: 12/29/2015 Document Revised: 05/19/2016 Document Reviewed: 12/29/2015 Elsevier Patient Education  2020 [employer].

## 2019-11-20 LAB — SARS CORONAVIRUS 2 (TAT 6-24 HRS): SARS Coronavirus 2: NEGATIVE

## 2019-11-20 LAB — URINE CULTURE: Culture: NO GROWTH

## 2019-11-22 ENCOUNTER — Ambulatory Visit: Payer: No Typology Code available for payment source | Admitting: Anesthesiology

## 2019-11-22 ENCOUNTER — Ambulatory Visit: Payer: No Typology Code available for payment source

## 2019-11-22 ENCOUNTER — Other Ambulatory Visit: Payer: Self-pay

## 2019-11-22 ENCOUNTER — Encounter
Admission: RE | Disposition: A | Discharge status: Home / Self Care | Payer: Self-pay | Source: Home / Self Care | Attending: Urology

## 2019-11-22 ENCOUNTER — Ambulatory Visit
Admission: RE | Admit: 2019-11-22 | Discharge: 2019-11-22 | Disposition: A | Discharge status: Home / Self Care | Payer: No Typology Code available for payment source | Attending: Urology | Admitting: Urology

## 2019-11-22 ENCOUNTER — Encounter: Payer: Self-pay | Admitting: *Deleted

## 2019-11-22 DIAGNOSIS — Z7982 Long term (current) use of aspirin: Secondary | ICD-10-CM | POA: Diagnosis not present

## 2019-11-22 DIAGNOSIS — Z7984 Long term (current) use of oral hypoglycemic drugs: Secondary | ICD-10-CM | POA: Insufficient documentation

## 2019-11-22 DIAGNOSIS — N329 Bladder disorder, unspecified: Secondary | ICD-10-CM | POA: Insufficient documentation

## 2019-11-22 DIAGNOSIS — N3289 Other specified disorders of bladder: Secondary | ICD-10-CM

## 2019-11-22 DIAGNOSIS — K760 Fatty (change of) liver, not elsewhere classified: Secondary | ICD-10-CM | POA: Insufficient documentation

## 2019-11-22 DIAGNOSIS — N200 Calculus of kidney: Secondary | ICD-10-CM

## 2019-11-22 DIAGNOSIS — E119 Type 2 diabetes mellitus without complications: Secondary | ICD-10-CM | POA: Diagnosis not present

## 2019-11-22 DIAGNOSIS — K219 Gastro-esophageal reflux disease without esophagitis: Secondary | ICD-10-CM | POA: Insufficient documentation

## 2019-11-22 DIAGNOSIS — I252 Old myocardial infarction: Secondary | ICD-10-CM | POA: Insufficient documentation

## 2019-11-22 DIAGNOSIS — Z79899 Other long term (current) drug therapy: Secondary | ICD-10-CM | POA: Insufficient documentation

## 2019-11-22 DIAGNOSIS — Z87891 Personal history of nicotine dependence: Secondary | ICD-10-CM | POA: Diagnosis not present

## 2019-11-22 DIAGNOSIS — M109 Gout, unspecified: Secondary | ICD-10-CM | POA: Insufficient documentation

## 2019-11-22 DIAGNOSIS — R310 Gross hematuria: Secondary | ICD-10-CM

## 2019-11-22 DIAGNOSIS — N3081 Other cystitis with hematuria: Secondary | ICD-10-CM | POA: Insufficient documentation

## 2019-11-22 DIAGNOSIS — I10 Essential (primary) hypertension: Secondary | ICD-10-CM | POA: Insufficient documentation

## 2019-11-22 DIAGNOSIS — R319 Hematuria, unspecified: Secondary | ICD-10-CM | POA: Insufficient documentation

## 2019-11-22 HISTORY — PX: CYSTOSCOPY WITH BIOPSY: SHX5122

## 2019-11-22 HISTORY — PX: CYSTOSCOPY/URETEROSCOPY/HOLMIUM LASER/STENT PLACEMENT: SHX6546

## 2019-11-22 LAB — GLUCOSE, CAPILLARY
Glucose-Capillary: 117 mg/dL — ABNORMAL HIGH (ref 70–99)
Glucose-Capillary: 138 mg/dL — ABNORMAL HIGH (ref 70–99)

## 2019-11-22 SURGERY — CYSTOSCOPY, WITH BIOPSY
Anesthesia: General

## 2019-11-22 MED ORDER — FENTANYL CITRATE (PF) 100 MCG/2ML IJ SOLN
INTRAMUSCULAR | Status: AC
  Filled 2019-11-22: qty 2

## 2019-11-22 MED ORDER — BELLADONNA ALKALOIDS-OPIUM 16.2-60 MG RE SUPP
RECTAL | Status: AC
  Filled 2019-11-22: qty 1

## 2019-11-22 MED ORDER — LABETALOL HCL 5 MG/ML IV SOLN
5.0000 mg | Freq: Once | INTRAVENOUS | Status: AC
  Administered 2019-11-22: 10:00:00 5 mg via INTRAVENOUS

## 2019-11-22 MED ORDER — FENTANYL CITRATE (PF) 100 MCG/2ML IJ SOLN
INTRAMUSCULAR | Status: AC
  Administered 2019-11-22: 25 ug via INTRAVENOUS
  Filled 2019-11-22: qty 2

## 2019-11-22 MED ORDER — BELLADONNA ALKALOIDS-OPIUM 16.2-60 MG RE SUPP
RECTAL | Status: DC | PRN
  Administered 2019-11-22: 1 via RECTAL

## 2019-11-22 MED ORDER — SUCCINYLCHOLINE CHLORIDE 20 MG/ML IJ SOLN
INTRAMUSCULAR | Status: DC | PRN
  Administered 2019-11-22: 120 mg via INTRAVENOUS

## 2019-11-22 MED ORDER — LACTATED RINGERS IV SOLN
INTRAVENOUS | Status: DC | PRN
  Administered 2019-11-22: 09:00:00 via INTRAVENOUS

## 2019-11-22 MED ORDER — ROCURONIUM BROMIDE 100 MG/10ML IV SOLN
INTRAVENOUS | Status: DC | PRN
  Administered 2019-11-22: 10 mg via INTRAVENOUS
  Administered 2019-11-22: 40 mg via INTRAVENOUS
  Administered 2019-11-22: 30 mg via INTRAVENOUS

## 2019-11-22 MED ORDER — CEFAZOLIN SODIUM-DEXTROSE 2-4 GM/100ML-% IV SOLN
INTRAVENOUS | Status: AC
  Filled 2019-11-22: qty 100

## 2019-11-22 MED ORDER — IOHEXOL 180 MG/ML  SOLN
INTRAMUSCULAR | Status: DC | PRN
  Administered 2019-11-22: 20 mL

## 2019-11-22 MED ORDER — SODIUM CHLORIDE 0.9 % IV SOLN
INTRAVENOUS | Status: DC
  Administered 2019-11-22: 07:00:00 via INTRAVENOUS

## 2019-11-22 MED ORDER — ONDANSETRON HCL 4 MG/2ML IJ SOLN
INTRAMUSCULAR | Status: DC | PRN
  Administered 2019-11-22: 4 mg via INTRAVENOUS

## 2019-11-22 MED ORDER — OXYCODONE HCL 5 MG/5ML PO SOLN: 5.0000 mg | Freq: Once | ORAL | Status: AC | PRN

## 2019-11-22 MED ORDER — PROPOFOL 10 MG/ML IV BOLUS
INTRAVENOUS | Status: DC | PRN
  Administered 2019-11-22: 200 mg via INTRAVENOUS

## 2019-11-22 MED ORDER — LACTATED RINGERS IV SOLN: INTRAVENOUS | Status: DC | PRN

## 2019-11-22 MED ORDER — FENTANYL CITRATE (PF) 100 MCG/2ML IJ SOLN
INTRAMUSCULAR | Status: DC | PRN
  Administered 2019-11-22: 50 ug via INTRAVENOUS
  Administered 2019-11-22: 100 ug via INTRAVENOUS
  Administered 2019-11-22: 50 ug via INTRAVENOUS

## 2019-11-22 MED ORDER — DEXAMETHASONE SODIUM PHOSPHATE 10 MG/ML IJ SOLN
INTRAMUSCULAR | Status: DC | PRN
  Administered 2019-11-22: 4 mg via INTRAVENOUS

## 2019-11-22 MED ORDER — OXYBUTYNIN CHLORIDE ER 10 MG PO TB24: 10.0000 mg | ORAL_TABLET | Freq: Every day | ORAL | 0 refills | Status: AC | PRN

## 2019-11-22 MED ORDER — KETOROLAC TROMETHAMINE 30 MG/ML IJ SOLN
INTRAMUSCULAR | Status: DC | PRN
  Administered 2019-11-22: 15 mg via INTRAVENOUS

## 2019-11-22 MED ORDER — PROPOFOL 10 MG/ML IV BOLUS
INTRAVENOUS | Status: AC
  Filled 2019-11-22: qty 20

## 2019-11-22 MED ORDER — SEVOFLURANE IN SOLN
RESPIRATORY_TRACT | Status: AC
  Filled 2019-11-22: qty 250

## 2019-11-22 MED ORDER — CEFAZOLIN SODIUM-DEXTROSE 2-4 GM/100ML-% IV SOLN
2.0000 g | INTRAVENOUS | Status: AC
  Administered 2019-11-22: 2 g via INTRAVENOUS

## 2019-11-22 MED ORDER — SUGAMMADEX SODIUM 200 MG/2ML IV SOLN
INTRAVENOUS | Status: DC | PRN
  Administered 2019-11-22: 200 mg via INTRAVENOUS

## 2019-11-22 MED ORDER — OXYBUTYNIN CHLORIDE ER 10 MG PO TB24
10.0000 mg | ORAL_TABLET | Freq: Once | ORAL | Status: AC
  Administered 2019-11-22: 10 mg via ORAL
  Filled 2019-11-22: qty 1

## 2019-11-22 MED ORDER — HYDROCODONE-ACETAMINOPHEN 5-325 MG PO TABS: 1.0000 | ORAL_TABLET | ORAL | 0 refills | Status: AC | PRN

## 2019-11-22 MED ORDER — LIDOCAINE HCL (CARDIAC) PF 100 MG/5ML IV SOSY
PREFILLED_SYRINGE | INTRAVENOUS | Status: DC | PRN
  Administered 2019-11-22: 100 mg via INTRAVENOUS

## 2019-11-22 MED ORDER — LABETALOL HCL 5 MG/ML IV SOLN
5.0000 mg | Freq: Once | INTRAVENOUS | Status: AC
  Administered 2019-11-22: 5 mg via INTRAVENOUS

## 2019-11-22 MED ORDER — OXYCODONE HCL 5 MG PO TABS
5.0000 mg | ORAL_TABLET | Freq: Once | ORAL | Status: AC | PRN
  Administered 2019-11-22: 5 mg via ORAL

## 2019-11-22 MED ORDER — OXYCODONE HCL 5 MG PO TABS
ORAL_TABLET | ORAL | Status: AC
  Filled 2019-11-22: qty 1

## 2019-11-22 MED ORDER — LABETALOL HCL 5 MG/ML IV SOLN
INTRAVENOUS | Status: AC
  Administered 2019-11-22: 5 mg via INTRAVENOUS
  Filled 2019-11-22: qty 4

## 2019-11-22 MED ORDER — TAMSULOSIN HCL 0.4 MG PO CAPS: 0.4000 mg | ORAL_CAPSULE | Freq: Every day | ORAL | 0 refills | Status: DC

## 2019-11-22 MED ORDER — LABETALOL HCL 5 MG/ML IV SOLN
10.0000 mg | Freq: Once | INTRAVENOUS | Status: AC
  Administered 2019-11-22: 10 mg via INTRAVENOUS

## 2019-11-22 MED ORDER — MIDAZOLAM HCL 2 MG/2ML IJ SOLN
INTRAMUSCULAR | Status: DC | PRN
  Administered 2019-11-22: 2 mg via INTRAVENOUS

## 2019-11-22 MED ORDER — FENTANYL CITRATE (PF) 100 MCG/2ML IJ SOLN
25.0000 ug | INTRAMUSCULAR | Status: DC | PRN
  Administered 2019-11-22 (×4): 25 ug via INTRAVENOUS

## 2019-11-22 MED ORDER — MIDAZOLAM HCL 2 MG/2ML IJ SOLN
INTRAMUSCULAR | Status: AC
  Filled 2019-11-22: qty 2

## 2019-11-22 SURGICAL SUPPLY — 38 items
BAG DRAIN CYSTO-URO LG1000N (MISCELLANEOUS) ×3 IMPLANT
BRUSH SCRUB EZ  4% CHG (MISCELLANEOUS) ×1
BRUSH SCRUB EZ 1% IODOPHOR (MISCELLANEOUS) ×3 IMPLANT
BRUSH SCRUB EZ 4% CHG (MISCELLANEOUS) ×2 IMPLANT
CATH URETL 5X70 OPEN END (CATHETERS) IMPLANT
CNTNR SPEC 2.5X3XGRAD LEK (MISCELLANEOUS)
CONT SPEC 4OZ STER OR WHT (MISCELLANEOUS)
CONTAINER SPEC 2.5X3XGRAD LEK (MISCELLANEOUS) IMPLANT
DRAPE UTILITY 15X26 TOWEL STRL (DRAPES) ×3 IMPLANT
DRSG TELFA 4X3 1S NADH ST (GAUZE/BANDAGES/DRESSINGS) ×3 IMPLANT
ELECT REM PT RETURN 9FT ADLT (ELECTROSURGICAL) ×3
ELECTRODE REM PT RTRN 9FT ADLT (ELECTROSURGICAL) ×2 IMPLANT
FIBER LASER TRAC TIP (UROLOGICAL SUPPLIES) IMPLANT
FIBER LASER TRACTIP 200 (UROLOGICAL SUPPLIES) ×1 IMPLANT
GLOVE BIOGEL PI IND STRL 7.5 (GLOVE) ×2 IMPLANT
GLOVE BIOGEL PI INDICATOR 7.5 (GLOVE) ×1
GOWN STRL REUS W/ TWL LRG LVL3 (GOWN DISPOSABLE) ×2 IMPLANT
GOWN STRL REUS W/ TWL XL LVL3 (GOWN DISPOSABLE) ×2 IMPLANT
GOWN STRL REUS W/TWL LRG LVL3 (GOWN DISPOSABLE) ×1
GOWN STRL REUS W/TWL XL LVL3 (GOWN DISPOSABLE) ×1
GUIDEWIRE GREEN .038 145CM (MISCELLANEOUS) ×1 IMPLANT
GUIDEWIRE STR DUAL SENSOR (WIRE) ×3 IMPLANT
INFUSOR MANOMETER BAG 3000ML (MISCELLANEOUS) ×3 IMPLANT
INTRODUCER DILATOR DOUBLE (INTRODUCER) ×1 IMPLANT
KIT TURNOVER CYSTO (KITS) ×3 IMPLANT
PACK CYSTO AR (MISCELLANEOUS) ×3 IMPLANT
SET CYSTO W/LG BORE CLAMP LF (SET/KITS/TRAYS/PACK) ×3 IMPLANT
SHEATH URETERAL 12FRX35CM (MISCELLANEOUS) IMPLANT
SOL .9 NS 3000ML IRR  AL (IV SOLUTION) ×1
SOL .9 NS 3000ML IRR UROMATIC (IV SOLUTION) ×2 IMPLANT
STENT URET 6FRX24 CONTOUR (STENTS) IMPLANT
STENT URET 6FRX26 CONTOUR (STENTS) IMPLANT
STENT URET 6FRX28 CONTOUR (STENTS) ×1 IMPLANT
SURGILUBE 2OZ TUBE FLIPTOP (MISCELLANEOUS) ×3 IMPLANT
SYR 10ML LL (SYRINGE) ×3 IMPLANT
VALVE UROSEAL ADJ ENDO (VALVE) IMPLANT
WATER STERILE IRR 1000ML POUR (IV SOLUTION) ×3 IMPLANT
WATER STERILE IRR 3000ML UROMA (IV SOLUTION) ×3 IMPLANT

## 2019-11-22 NOTE — H&P (Signed)
11/22/19 7:31 AM   [name] 20-Jan-1971 [id number]   HPI: Mr. Baltazar Najjar is a 48 year old male with a cardiac history no longer on anticoagulation who presented with gross hematuria.  On clinic cystoscopy he was found to have a small 1 cm bladder lesion.  He also has an 8 mm left lower pole renal stone that he would like managed at the same time.  We will also perform bilateral retrograde pyelograms for completion of gross hematuria work-up.  He denies any chest pain, shortness of breath, or dysuria.   PMH: Past Medical History:  Diagnosis Date  . Angioedema   . Bronchitis   . DDD (degenerative disc disease), cervical   . Diabetes mellitus ([place])   . Fatty liver 03/31/2017   [place] April 2018  . GERD (gastroesophageal reflux disease)   . Gout   . Heart attack ([place])   . HTN (hypertension)   . Hypertension    CONTROLLED ON MEDS  . IBS (irritable bowel syndrome)   . Kidney stone   . LVH (left ventricular hypertrophy) 11/24/2018   Moderate, ECHO  . Myocardial infarction ([place]) 08/23/2018  . Seasonal allergies   . Spinal stenosis     Surgical History: Past Surgical History:  Procedure Laterality Date  . ACDF with fusion  2007  . CARDIAC CATHETERIZATION Left 08/15/2016   Procedure: Left Heart Cath and Coronary Angiography;  Surgeon: Wellington Hampshire, MD;  Location: [place] [facility];  Service: Cardiovascular;  Laterality: Left;  . COLONOSCOPY WITH PROPOFOL N/A 06/26/2015   Procedure: COLONOSCOPY WITH PROPOFOL;  Surgeon: Lucilla Lame, MD;  Location: [place];  Service: Endoscopy;  Laterality: N/A;  with biopsies  . CORONARY/GRAFT ACUTE MI REVASCULARIZATION N/A 08/23/2018   Procedure: Coronary/Graft Acute MI Revascularization;  Surgeon: Isaias Cowman, MD;  Location: [place] [facility];  Service: Cardiovascular;  Laterality: N/A;  . LEFT HEART CATH AND CORONARY ANGIOGRAPHY N/A 08/23/2018   Procedure: LEFT HEART CATH AND CORONARY ANGIOGRAPHY;  Surgeon: Isaias Cowman, MD;  Location: [place] [facility];  Service: Cardiovascular;  Laterality: N/A;  . NASAL SEPTUM SURGERY  2004  . SHOULDER SURGERY Left 2007  . Testicular torsion  1980s  . VASECTOMY        Allergies:  Allergies  Allergen Reactions  . Lisinopril Swelling  . Ace Inhibitors Swelling    Family History: Family History  Problem Relation Age of Onset  . Breast cancer Mother   . Skin cancer Mother   . Arrhythmia Mother        A-fib  . Cancer Mother        breast, colon?, skin  . Hypertension Father   . Diabetes Father   . Cancer Maternal Grandfather   . Heart disease Brother        stent  . Allergies Son   . Alzheimer's disease Maternal Grandmother   . Stroke Neg Hx   . COPD Neg Hx     Social History:  reports that he quit smoking about 4 years ago. His smoking use included cigarettes. He has a 10.00 pack-year smoking history. He has never used smokeless tobacco. He reports that he does not drink alcohol or use drugs.  Physical Exam: BP (!) 139/99   Pulse 67   Temp (!) 97.1 F (36.2 C) (Temporal)   Resp 16   Ht 5\' 11"  (1.803 m)   Wt 91 kg   SpO2 99%   BMI 27.98 kg/m    Constitutional:  Alert and  oriented, No acute distress. Cardiovascular: Regular rate and rhythm Respiratory: Clear to auscultation bilaterally GI: Abdomen is soft, nontender, nondistended, no abdominal masses Lymph: No cervical or inguinal lymphadenopathy. Skin: No rashes, bruises or suspicious lesions. Neurologic: Grossly intact, no focal deficits, moving all 4 extremities. Psychiatric: Normal mood and affect.  Laboratory Data: Urine culture 11/19/2019 no growth   Assessment & Plan:   In summary he is a 48 year old male with a cardiac history no longer on anticoagulation who presented with significant hematuria.  Clinic cystoscopy suggested a 1 cm bladder lesion.  Will perform bilateral retrograde pyelograms at the same time to complete his hematuria work-up.  He also would like his  8 mm left lower pole stone addressed which is very reasonable.  We discussed bladder biopsy and risks and benefits at length. This is typically a 1 to 2-hour procedure done under general anesthesia in the operating room.  A scope is inserted through the urethra and used to resect abnormal tissue within the bladder, which is then sent to the pathologist to determine grade and stage of the tumor.  Risks include bleeding, infection, need for temporary Foley placement, and bladder perforation.  Treatment strategies are based on the type of tumor and depth of invasion.   We specifically discussed the risks ureteroscopy including bleeding, infection/sepsis, stent related symptoms including flank pain/urgency/frequency/incontinence/dysuria, ureteral injury, inability to access stone, or need for staged or additional procedures.  Cystoscopy, bladder biopsy and fulguration, left ureteroscopy, laser lithotripsy, stent placement, bilateral retrograde pyelograms  Billey Co, MD  [facility] [address] [place], [place] [address] [phone]

## 2019-11-22 NOTE — Anesthesia Post-op Follow-up Note (Signed)
Anesthesia QCDR form completed.        

## 2019-11-22 NOTE — Anesthesia Preprocedure Evaluation (Signed)
Anesthesia Evaluation  Patient identified by MRN, date of birth, ID band Patient awake    Reviewed: Allergy & Precautions, H&P , NPO status , Patient's Chart, lab work & pertinent test results  History of Anesthesia Complications Negative for: history of anesthetic complications  Airway Mallampati: II  TM Distance: >3 FB Neck ROM: full    Dental  (+) Chipped   Pulmonary sleep apnea , former smoker,           Cardiovascular Exercise Tolerance: Good hypertension, (-) angina+ CAD, + Past MI and + Cardiac Stents  (-) DOE      Neuro/Psych negative neurological ROS  negative psych ROS   GI/Hepatic Neg liver ROS, GERD  Controlled,  Endo/Other  diabetes, Type 2  Renal/GU Renal disease     Musculoskeletal  (+) Arthritis ,   Abdominal   Peds  Hematology negative hematology ROS (+)   Anesthesia Other Findings Past Medical History: No date: Angioedema No date: Bronchitis No date: DDD (degenerative disc disease), cervical No date: Diabetes mellitus ([employer]) 03/31/2017: Fatty liver     Comment:  [place] April 2018 No date: GERD (gastroesophageal reflux disease) No date: Gout No date: Heart attack ([place]) No date: HTN (hypertension) No date: Hypertension     Comment:  CONTROLLED ON MEDS No date: IBS (irritable bowel syndrome) No date: Kidney stone 11/24/2018: LVH (left ventricular hypertrophy)     Comment:  Moderate, ECHO 08/23/2018: Myocardial infarction [facility]) No date: Seasonal allergies No date: Spinal stenosis  Past Surgical History: 2007: ACDF with fusion 08/15/2016: CARDIAC CATHETERIZATION; Left     Comment:  Procedure: Left Heart Cath and Coronary Angiography;                Surgeon: Wellington Hampshire, MD;  Location: [place]               CV LAB;  Service: Cardiovascular;  Laterality: Left; 06/26/2015: COLONOSCOPY WITH PROPOFOL; N/A     Comment:  Procedure: COLONOSCOPY WITH PROPOFOL;  Surgeon: Lucilla Lame, MD;  Location: [place];  Service:               Endoscopy;  Laterality: N/A;  with biopsies 08/23/2018: CORONARY/GRAFT ACUTE MI REVASCULARIZATION; N/A     Comment:  Procedure: Coronary/Graft Acute MI Revascularization;                Surgeon: Isaias Cowman, MD;  Location: [place] CV LAB;  Service: Cardiovascular;  Laterality:               N/A; 08/23/2018: LEFT HEART CATH AND CORONARY ANGIOGRAPHY; N/A     Comment:  Procedure: LEFT HEART CATH AND CORONARY ANGIOGRAPHY;                Surgeon: Isaias Cowman, MD;  Location: [place] CV LAB;  Service: Cardiovascular;  Laterality:               N/A; 2004: NASAL SEPTUM SURGERY 2007: SHOULDER SURGERY; Left 1980s: Testicular torsion No date: VASECTOMY  BMI    Body Mass Index: 27.98 kg/m      Reproductive/Obstetrics negative OB ROS                             Anesthesia Physical Anesthesia Plan  ASA:  III  Anesthesia Plan: General ETT   Post-op Pain Management:    Induction: Intravenous  PONV Risk Score and Plan: Ondansetron, Dexamethasone, Midazolam and Treatment may vary due to age or medical condition  Airway Management Planned: Oral ETT  Additional Equipment:   Intra-op Plan:   Post-operative Plan: Extubation in OR  Informed Consent: I have reviewed the patients History and Physical, chart, labs and discussed the procedure including the risks, benefits and alternatives for the proposed anesthesia with the patient or authorized representative who has indicated his/her understanding and acceptance.     Dental Advisory Given  Plan Discussed with: Anesthesiologist, CRNA and Surgeon  Anesthesia Plan Comments: (Patient consented for risks of anesthesia including but not limited to:  - adverse reactions to medications - damage to teeth, lips or other oral mucosa - sore throat or hoarseness - Damage to heart, brain, lungs or loss of  life  Patient voiced understanding.)        Anesthesia Quick Evaluation

## 2019-11-22 NOTE — Anesthesia Postprocedure Evaluation (Signed)
Anesthesia Post Note  Patient: Jovonne Odorizzi  Procedure(s) Performed: CYSTOSCOPY WITH Bladder BIOPSY & FULGERATION (N/A ) CYSTOSCOPY/URETEROSCOPY/BILATERAL RETROGRADE PYELOGRAM/HOLMIUM LASER/STENT PLACEMENT (Left )  Patient location during evaluation: PACU Anesthesia Type: General Level of consciousness: awake and alert Pain management: pain level controlled Vital Signs Assessment: post-procedure vital signs reviewed and stable Respiratory status: spontaneous breathing, nonlabored ventilation, respiratory function stable and patient connected to nasal cannula oxygen Cardiovascular status: blood pressure returned to baseline and stable Postop Assessment: no apparent nausea or vomiting Anesthetic complications: no     Last Vitals:  Vitals:   11/22/19 1048 11/22/19 1107  BP: (!) 173/103 (!) 150/97  Pulse: 71 71  Resp: 16 16  Temp: (!) 36.4 C   SpO2: 99% 100%    Last Pain:  Vitals:   11/22/19 1107  TempSrc:   PainSc: 4                  Broadus John K Piscitello

## 2019-11-22 NOTE — Anesthesia Procedure Notes (Signed)
Procedure Name: Intubation Date/Time: 11/22/2019 8:06 AM Performed by: Nelda Marseille, CRNA Pre-anesthesia Checklist: Patient identified, Patient being monitored, Timeout performed, Emergency Drugs available and Suction available Patient Re-evaluated:Patient Re-evaluated prior to induction Oxygen Delivery Method: Circle system utilized Preoxygenation: Pre-oxygenation with 100% oxygen Induction Type: IV induction Ventilation: Mask ventilation without difficulty Laryngoscope Size: Mac, 3 and Glidescope Grade View: Grade II Tube type: Oral Tube size: 7.5 mm Number of attempts: 1 Airway Equipment and Method: Stylet Placement Confirmation: ETT inserted through vocal cords under direct vision,  positive ETCO2 and breath sounds checked- equal and bilateral Secured at: 21 cm Tube secured with: Tape Dental Injury: Teeth and Oropharynx as per pre-operative assessment

## 2019-11-22 NOTE — Op Note (Signed)
Date of procedure: 11/22/19  Preoperative diagnosis:  1. Bladder lesion 2. Left renal stone 3. Gross hematuria  Postoperative diagnosis:  1. Same  Procedure: 1. Cystoscopy, bladder biopsy and fulguration, bilateral retrograde pyelograms with intraoperative interpretation, left ureteroscopy, laser lithotripsy, left ureteral stent placement  Surgeon: Nickolas Madrid, MD  Anesthesia: General  Complications: None  Intraoperative findings:  1.  Ureteral orifices orthotopic bilaterally, small 1 cm area of dilated vessels and erythema just lateral to the left ureteral orifice.  Not classic appearing for bladder tumor. 2.  Bilateral retrograde pyelograms with no filling defects or hydronephrosis 3.  Uncomplicated left ureteroscopy and dusting of left 37m renal stone  EBL: Minimal  Specimens:  1. Bladder lesion  Drains: Left 6 French by 28 cm ureteral stent  Indication: SConstantin Hilleryis a 48y.o. patient with gross hematuria and a small bladder lesion on clinic cystoscopy.  He also had an 8 mm left renal stone that he wanted treated simultaneously.  After reviewing the management options for treatment, they elected to proceed with the above surgical procedure(s). We have discussed the potential benefits and risks of the procedure, side effects of the proposed treatment, the likelihood of the patient achieving the goals of the procedure, and any potential problems that might occur during the procedure or recuperation. Informed consent has been obtained.  Description of procedure:  The patient was taken to the operating room and general anesthesia was induced. SCDs were placed for DVT prophylaxis. The patient was placed in the dorsal lithotomy position, prepped and draped in the usual sterile fashion, and preoperative antibiotics(Ancef) were administered. A preoperative time-out was performed.   A 21 French rigid cystoscope was used to intubate the meatus and a normal-appearing urethra was  followed proximally in the bladder.  The prostate was small.  Thorough cystoscopy was performed with a 30 degree and 70 degree lens.  The ureteral orifices were orthotopic bilaterally.  There was a small <1 cm area of dilated blood vessels and erythema just lateral to the left ureteral orifice.  This was not classic appearing for bladder tumor.  There was efflux of clear yellow urine from the ureters bilaterally.  The cold cup biopsy forceps were used to carefully remove the abnormal tissue in 3 bites taking care to avoid the left ureteral orifice.  A Bugbee was then used for fulguration and there was excellent hemostasis, and no injury to the left ureteral orifice.  I turned my attention to the right ureteral orifice and a retrograde pyelogram was performed by advancing an access catheter into the distal ureter.  There was no hydronephrosis, filling defects, or other abnormalities.  An identical procedure was performed on the left side.  Under fluoroscopy the left renal stone could be clearly seen.  Again, there was no hydronephrosis, filling defects, or other abnormalities.  A sensor wire was advanced into the left kidney under fluoroscopic vision.  I attempted to pass the single-channel flexible ureteroscope over the wire but met resistance at the distal ureter.  I attempted to pass an 8/10 [place] catheter but again met some resistance distally.  A Super Stiff wire was then advanced up into the kidney and the sensor wire removed.  The single-channel flexible ureteroscope then advanced easily over the Super Stiff wire into the kidney.  Thorough pyeloscopy revealed a 8 mm stone in the renal pelvis.  With a 200 m laser fiber on settings of 0.5 J and 20 Hz the stone was methodically dusted to <1 millimeter  fragments.  Notably movement throughout the kidney was restricted, likely secondary to a narrow ureter.  I did not visualize any significant residual fragments at the conclusion of the case.  The  sensor wire was replaced through the scope and pullback ureteroscopy demonstrated no residual fragments.  A rigid cystoscope was backloaded over the wire and a 6 [place] by 28 cm stent was uneventfully placed.  There was a shepherd's hook in the lower or mid pole, and an excellent curl in the bladder under direct vision.  Fluid drained briskly through the side-port of the stent.  Belladonna suppository was placed and this concluded the case.  Disposition: Stable to PACU  Plan: Follow-up in clinic for stent removal in 1 week Will discuss bladder pathology at that visit  Nickolas Madrid, MD

## 2019-11-22 NOTE — Transfer of Care (Signed)
Immediate Anesthesia Transfer of Care Note  Patient: Jorden Knope  Procedure(s) Performed: CYSTOSCOPY WITH Bladder BIOPSY & FULGERATION (N/A ) CYSTOSCOPY/URETEROSCOPY/BILATERAL RETROGRADE PYELOGRAM/HOLMIUM LASER/STENT PLACEMENT (Left )  Patient Location: PACU  Anesthesia Type:General  Level of Consciousness: sedated  Airway & Oxygen Therapy: Patient Spontanous Breathing and Patient connected to face mask oxygen  Post-op Assessment: Report given to RN and Post -op Vital signs reviewed and stable  Post vital signs: Reviewed and stable  Last Vitals:  Vitals Value Taken Time  BP 146/101 11/22/19 0916  Temp    Pulse 83 11/22/19 0916  Resp 16 11/22/19 0916  SpO2 99 % 11/22/19 0916  Vitals shown include unvalidated device data.  Last Pain:  Vitals:   11/22/19 0631  TempSrc: Temporal  PainSc: 0-No pain         Complications: No apparent anesthesia complications

## 2019-11-22 NOTE — Discharge Instructions (Signed)

## 2019-11-24 ENCOUNTER — Other Ambulatory Visit: Payer: Self-pay

## 2019-11-24 ENCOUNTER — Emergency Department
Admission: EM | Admit: 2019-11-24 | Discharge: 2019-11-24 | Disposition: A | Discharge status: Home / Self Care | Payer: 59 | Attending: Emergency Medicine | Admitting: Emergency Medicine

## 2019-11-24 ENCOUNTER — Emergency Department: Payer: 59

## 2019-11-24 DIAGNOSIS — Z87891 Personal history of nicotine dependence: Secondary | ICD-10-CM | POA: Insufficient documentation

## 2019-11-24 DIAGNOSIS — Z79899 Other long term (current) drug therapy: Secondary | ICD-10-CM | POA: Diagnosis not present

## 2019-11-24 DIAGNOSIS — R310 Gross hematuria: Secondary | ICD-10-CM | POA: Insufficient documentation

## 2019-11-24 DIAGNOSIS — Z7982 Long term (current) use of aspirin: Secondary | ICD-10-CM | POA: Insufficient documentation

## 2019-11-24 DIAGNOSIS — I10 Essential (primary) hypertension: Secondary | ICD-10-CM | POA: Diagnosis not present

## 2019-11-24 DIAGNOSIS — N390 Urinary tract infection, site not specified: Secondary | ICD-10-CM | POA: Insufficient documentation

## 2019-11-24 DIAGNOSIS — I252 Old myocardial infarction: Secondary | ICD-10-CM | POA: Insufficient documentation

## 2019-11-24 DIAGNOSIS — E119 Type 2 diabetes mellitus without complications: Secondary | ICD-10-CM | POA: Diagnosis not present

## 2019-11-24 DIAGNOSIS — R319 Hematuria, unspecified: Secondary | ICD-10-CM | POA: Diagnosis present

## 2019-11-24 DIAGNOSIS — I2510 Atherosclerotic heart disease of native coronary artery without angina pectoris: Secondary | ICD-10-CM | POA: Diagnosis not present

## 2019-11-24 DIAGNOSIS — R109 Unspecified abdominal pain: Secondary | ICD-10-CM

## 2019-11-24 LAB — URINALYSIS, COMPLETE (UACMP) WITH MICROSCOPIC
Bilirubin Urine: NEGATIVE
Glucose, UA: >=500 mg/dL — AB
Ketones, ur: 5 mg/dL — AB
Leukocytes,Ua: NEGATIVE
Nitrite: POSITIVE — AB
Protein, ur: 100 mg/dL — AB
RBC / HPF: >50 RBC/hpf — ABNORMAL HIGH (ref 0–5)
Specific Gravity, Urine: 1.015 (ref 1.005–1.030)
WBC, UA: >50 WBC/hpf — ABNORMAL HIGH (ref 0–5)
pH: 5 (ref 5.0–8.0)

## 2019-11-24 LAB — CBC WITH DIFFERENTIAL/PLATELET
Abs Immature Granulocytes: 0.05 10*3/uL (ref 0.00–0.07)
Basophils Absolute: 0.1 10*3/uL (ref 0.0–0.1)
Basophils Relative: 1 %
Eosinophils Absolute: 0.3 10*3/uL (ref 0.0–0.5)
Eosinophils Relative: 3 %
HCT: 47 % (ref 39.0–52.0)
Hemoglobin: 15.8 g/dL (ref 13.0–17.0)
Immature Granulocytes: 1 %
Lymphocytes Relative: 24 %
Lymphs Abs: 2.4 10*3/uL (ref 0.7–4.0)
MCH: 26.2 pg (ref 26.0–34.0)
MCHC: 33.6 g/dL (ref 30.0–36.0)
MCV: 77.9 fL — ABNORMAL LOW (ref 80.0–100.0)
Monocytes Absolute: 0.9 10*3/uL (ref 0.1–1.0)
Monocytes Relative: 9 %
Neutro Abs: 6.4 10*3/uL (ref 1.7–7.7)
Neutrophils Relative %: 62 %
Platelets: 318 10*3/uL (ref 150–400)
RBC: 6.03 MIL/uL — ABNORMAL HIGH (ref 4.22–5.81)
RDW: 12.3 % (ref 11.5–15.5)
WBC: 10.1 10*3/uL (ref 4.0–10.5)
nRBC: 0 % (ref 0.0–0.2)

## 2019-11-24 LAB — BASIC METABOLIC PANEL
Anion gap: 19 — ABNORMAL HIGH (ref 5–15)
BUN: 15 mg/dL (ref 6–20)
CO2: 16 mmol/L — ABNORMAL LOW (ref 22–32)
Calcium: 9.4 mg/dL (ref 8.9–10.3)
Chloride: 104 mmol/L (ref 98–111)
Creatinine, Ser: 0.93 mg/dL (ref 0.61–1.24)
GFR calc Af Amer: >60 mL/min (ref >60)
GFR calc non Af Amer: >60 mL/min (ref >60)
Glucose, Bld: 135 mg/dL — ABNORMAL HIGH (ref 70–99)
Potassium: 3.5 mmol/L (ref 3.5–5.1)
Sodium: 139 mmol/L (ref 135–145)

## 2019-11-24 MED ORDER — ONDANSETRON 4 MG PO TBDP: 4.0000 mg | ORAL_TABLET | Freq: Three times a day (TID) | ORAL | 0 refills | Status: DC | PRN

## 2019-11-24 MED ORDER — ONDANSETRON HCL 4 MG/2ML IJ SOLN
4.0000 mg | Freq: Once | INTRAMUSCULAR | Status: AC
  Administered 2019-11-24: 4 mg via INTRAVENOUS
  Filled 2019-11-24: qty 2

## 2019-11-24 MED ORDER — LACTATED RINGERS IV BOLUS
1000.0000 mL | Freq: Once | INTRAVENOUS | Status: AC
  Administered 2019-11-24: 1000 mL via INTRAVENOUS

## 2019-11-24 MED ORDER — SODIUM CHLORIDE 0.9 % IV SOLN
1.0000 g | Freq: Once | INTRAVENOUS | Status: AC
  Administered 2019-11-24: 1 g via INTRAVENOUS
  Filled 2019-11-24: qty 10

## 2019-11-24 MED ORDER — HYDROMORPHONE HCL 1 MG/ML IJ SOLN
1.0000 mg | Freq: Once | INTRAMUSCULAR | Status: AC
  Administered 2019-11-24: 1 mg via INTRAVENOUS
  Filled 2019-11-24: qty 1

## 2019-11-24 MED ORDER — OXYCODONE-ACETAMINOPHEN 5-325 MG PO TABS: 1.0000 | ORAL_TABLET | ORAL | 0 refills | Status: DC | PRN

## 2019-11-24 MED ORDER — SULFAMETHOXAZOLE-TRIMETHOPRIM 800-160 MG PO TABS: 1.0000 | ORAL_TABLET | Freq: Two times a day (BID) | ORAL | 0 refills | Status: AC

## 2019-11-24 MED ORDER — KETOROLAC TROMETHAMINE 30 MG/ML IJ SOLN
15.0000 mg | Freq: Once | INTRAMUSCULAR | Status: AC
  Administered 2019-11-24: 15 mg via INTRAVENOUS
  Filled 2019-11-24: qty 1

## 2019-11-24 MED ORDER — OXYCODONE-ACETAMINOPHEN 5-325 MG PO TABS
1.0000 | ORAL_TABLET | Freq: Once | ORAL | Status: AC
  Administered 2019-11-24: 1 via ORAL
  Filled 2019-11-24: qty 1

## 2019-11-24 MED ORDER — KETOROLAC TROMETHAMINE 10 MG PO TABS: 10.0000 mg | ORAL_TABLET | Freq: Four times a day (QID) | ORAL | 0 refills | Status: DC | PRN

## 2019-11-24 NOTE — ED Triage Notes (Signed)
Pt c/o left sided flank pain 10/10 that started suddenly two hours ago and n/[id number] at home. Pt reports bladder biopsy and left sided kidney stent. Per ems, pt received 100 fentanyl en route w/o relief.  Pt aox4. Appears uncomfortable. Tachycardic.

## 2019-11-24 NOTE — ED Notes (Signed)
US at bedside

## 2019-11-24 NOTE — ED Provider Notes (Signed)
[facility] Emergency Department Provider Note   ____________________________________________   First MD Initiated Contact with Patient 11/24/19 1658     (approximate)  I have reviewed the triage vital signs and the nursing notes.   HISTORY  Chief Complaint Abdominal Pain and Nausea    HPI John Hartman is a 48 y.o. male with possible history of hypertension, CAD, diabetes who presents to the ED complaining of flank pain.  Patient reports that he had been dealing with hematuria for some time and was following with Dr. Diamantina Providence of urology.  He had a known left-sided 8 mm stone however this was nonobstructive and cystoscopy performed 2 days ago showed bladder lesion thought to be the more likely source of his hematuria.  He had stent placed at the time of cystoscopy in order to address the stone and he states he was doing well at the time of discharge.  He has had intermittent episodes of sharp pain in his left flank lasting only for a few minutes at a time since the procedure, however he now describes more severe and long-lasting pain.  He complains of 10 out of 10 left flank pain constant over the past 2 hours.  It is been associated with nausea and 2 episodes of nonbilious and nonbloody vomiting.  He denies any fevers, but does state it has burns somewhat when he has been urinating.  He denies any difficulty urinating, but does urinate small amounts at a time.  He did not take anything for pain prior to arrival.        Past Medical History:  Diagnosis Date   Angioedema    Bronchitis    DDD (degenerative disc disease), cervical    Diabetes mellitus ([place])    Fatty liver 03/31/2017   [place] April 2018   GERD (gastroesophageal reflux disease)    Gout    Heart attack ([place])    HTN (hypertension)    Hypertension    CONTROLLED ON MEDS   IBS (irritable bowel syndrome)    Kidney stone    LVH (left ventricular hypertrophy) 11/24/2018   Moderate, ECHO    Myocardial infarction ([place]) 08/23/2018   Seasonal allergies    Spinal stenosis     Patient Active Problem List   Diagnosis Date Noted   Arthritis of right acromioclavicular joint 09/23/2019   Biceps tendonitis, unspecified laterality 09/23/2019   Bursitis of right shoulder 09/23/2019   Incomplete tear of right rotator cuff 09/23/2019   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   Tachycardia 03/24/2019   Status post myocardial infarction 11/24/2018   LVH (left ventricular hypertrophy) 11/24/2018   Retrolisthesis of vertebrae 11/24/2018   History of angioedema 09/01/2018   NSTEMI (non-ST elevated myocardial infarction) ([place]) 08/22/2018   Elevated hemoglobin ([place]) 08/11/2017   Chronic lower back pain 08/04/2017   Degenerative disc disease, lumbar 08/04/2017   Arthritis, 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   Calcification of abdominal aorta ([place]) 08/04/2017   Fatty liver 03/31/2017   Elevated liver enzymes 02/11/2017   Serum total bilirubin elevated 02/11/2017   Hyperlipidemia LDL goal <70 01/04/2017   Elevated serum glutamic pyruvic transaminase (SGPT) level 01/04/2017   Angioedema    Encounter for medication monitoring 08/25/2016   Coronary artery disease 08/25/2016   Uncontrolled diabetes mellitus with hyperglycemia ([place])    Chest pain 08/08/2016   Preventative health care 08/02/2016   Ketonuria 07/12/2016   Glucosuria 07/12/2016   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/2015   Palpitations 08/12/2015   Family history of malignant neoplasm of gastrointestinal  tract    Benign neoplasm of rectosigmoid junction    Essential hypertension 05/28/2015   Gout 05/28/2015   IBS (irritable bowel syndrome) 05/28/2015    Past Surgical History:  Procedure Laterality Date   ACDF with fusion  2007   CARDIAC CATHETERIZATION Left 08/15/2016   Procedure: Left Heart Cath and Coronary Angiography;  Surgeon: Wellington Hampshire, MD;  Location: [place] [facility];  Service: Cardiovascular;   Laterality: Left;   COLONOSCOPY WITH PROPOFOL N/A 06/26/2015   Procedure: COLONOSCOPY WITH PROPOFOL;  Surgeon: Lucilla Lame, MD;  Location: [place];  Service: Endoscopy;  Laterality: N/A;  with biopsies   CORONARY/GRAFT ACUTE MI REVASCULARIZATION N/A 08/23/2018   Procedure: Coronary/Graft Acute MI Revascularization;  Surgeon: Isaias Cowman, MD;  Location: [place] [facility];  Service: Cardiovascular;  Laterality: N/A;   CYSTOSCOPY WITH BIOPSY N/A 11/22/2019   Procedure: CYSTOSCOPY WITH Bladder BIOPSY & [place] Free;  Surgeon: Billey Co, MD;  Location: [facility];  Service: Urology;  Laterality: N/A;   CYSTOSCOPY/URETEROSCOPY/HOLMIUM LASER/STENT PLACEMENT Left 11/22/2019   Procedure: CYSTOSCOPY/URETEROSCOPY/BILATERAL RETROGRADE PYELOGRAM/HOLMIUM LASER/STENT PLACEMENT;  Surgeon: Billey Co, MD;  Location: [facility];  Service: Urology;  Laterality: Left;   LEFT HEART CATH AND CORONARY ANGIOGRAPHY N/A 08/23/2018   Procedure: LEFT HEART CATH AND CORONARY ANGIOGRAPHY;  Surgeon: Isaias Cowman, MD;  Location: [place] [facility];  Service: Cardiovascular;  Laterality: N/A;   NASAL SEPTUM SURGERY  2004   SHOULDER SURGERY Left 2007   Testicular torsion  1980s   VASECTOMY      Prior to Admission medications   Medication Sig Start Date End Date Taking? Authorizing Provider  ACCU-CHEK SOFTCLIX LANCETS lancets USE AS DIRECTED 07/23/16   Arnetha Courser, MD  allopurinol (ZYLOPRIM) 100 MG tablet Take 100 mg by mouth daily.     [provider]  aspirin EC 81 MG tablet Take 81 mg by mouth daily.    [provider]  atorvastatin (LIPITOR) 80 MG tablet Take 1 tablet (80 mg total) by mouth daily at 6 PM. Patient taking differently: Take 80 mg by mouth daily with supper.  08/25/18 11/19/20  Saundra Shelling, MD  BELBUCA 450 MCG FILM Place 1 Film inside cheek 2 (two) times daily. Patient not taking: Reported on 11/20/2019 07/17/19   Gillis Santa, MD    cetirizine (ZYRTEC) 10 MG tablet Take 10 mg by mouth at bedtime.    [provider]  Dapagliflozin-metFORMIN HCl ER (XIGDUO XR) 04-999 MG TB24 Take 2 tablets by mouth daily.     [provider]  diphenhydrAMINE (BENADRYL) 25 MG tablet Take 50 mg by mouth at bedtime.    [provider]  ezetimibe (ZETIA) 10 MG tablet Take 1 tablet (10 mg total) by mouth daily. 12/03/18   Arnetha Courser, MD  famotidine (PEPCID) 20 MG tablet Take 20 mg by mouth at bedtime.    [provider]  hydrALAZINE (APRESOLINE) 25 MG tablet Take 1 tablet (25 mg total) by mouth 2 (two) times daily. 08/25/18 11/19/20  Saundra Shelling, MD  HYDROcodone-acetaminophen (NORCO/VICODIN) 5-325 MG tablet Take 1 tablet by mouth every 4 (four) hours as needed for up to 3 days for moderate pain. 11/22/19 11/25/19  Billey Co, MD  ketorolac (TORADOL) 10 MG tablet Take 1 tablet (10 mg total) by mouth every 6 (six) hours as needed. 11/24/19   Blake Divine, MD  Melatonin 5 MG TABS Take 10 mg by mouth at bedtime.     [provider]  Menthol, Topical Analgesic, (BIOFREEZE EX) Apply 1 application topically 3 (three) times daily as needed (muscle cramps/shoulder pain.).    [provider]  metoprolol tartrate (LOPRESSOR) 25 MG tablet Take 25 mg by mouth 2 (two) times daily. 08/22/19   [provider]  nitroGLYCERIN (NITROSTAT) 0.4 MG SL tablet Place 1 tablet (0.4 mg total) under the tongue every 5 (five) minutes as needed for chest pain. 08/25/18   Saundra Shelling, MD  nystatin-triamcinolone ointment (MYCOLOG) Apply 1 application topically 2 (two) times daily. 11/12/19   Billey Co, MD  ondansetron (ZOFRAN ODT) 4 MG disintegrating tablet Take 1 tablet (4 mg total) by mouth every 8 (eight) hours as needed for nausea or vomiting. 11/24/19   Blake Divine, MD  [facility] VERIO test strip USE 2 (TWO) TIMES DAILY USE AS INSTRUCTED. 09/16/19   [provider]  oxybutynin  (DITROPAN XL) 10 MG 24 hr tablet Take 1 tablet (10 mg total) by mouth daily as needed for up to 10 days (bladder spasm/bladder pain from stent). 11/22/19 12/02/19  Billey Co, MD  oxyCODONE-acetaminophen (PERCOCET) 5-325 MG tablet Take 1 tablet by mouth every 4 (four) hours as needed for severe pain. 11/24/19 11/23/20  Blake Divine, MD  pregabalin (LYRICA) 50 MG capsule Take 1 capsule (50 mg total) by mouth 3 (three) times daily. Patient not taking: Reported on 11/20/2019 07/17/19   Gillis Santa, MD  RYBELSUS 3 MG TABS Take 3 mg by mouth daily at 6 (six) AM.    [provider]  Semaglutide (RYBELSUS) 7 MG TABS Take by mouth. 11/25/19   [provider]  sulfamethoxazole-trimethoprim (BACTRIM DS) 800-160 MG tablet Take 1 tablet by mouth 2 (two) times daily for 10 days. 11/24/19 12/04/19  Blake Divine, MD  tamsulosin (FLOMAX) 0.4 MG CAPS capsule Take 1 capsule (0.4 mg total) by mouth daily after supper. 11/22/19   Billey Co, MD  tiZANidine (ZANAFLEX) 4 MG tablet Take 1 tablet (4 mg total) by mouth every 8 (eight) hours as needed for muscle spasms. 07/17/19   Gillis Santa, MD  vitamin B-12 (CYANOCOBALAMIN) 1000 MCG tablet Take 1,000 mcg by mouth 3 (three) times a week.    [provider]    Allergies Lisinopril and Ace inhibitors  Family History  Problem Relation Age of Onset   Breast cancer Mother    Skin cancer Mother    Arrhythmia Mother        A-fib   Cancer Mother        breast, colon?, skin   Hypertension Father    Diabetes Father    Cancer Maternal Grandfather    Heart disease Brother        stent   Allergies Son    Alzheimer's disease Maternal Grandmother    Stroke Neg Hx    COPD Neg Hx     Social History Social History   Tobacco Use   Smoking status: Former Smoker    Packs/day: 1.00    Years: 10.00    Pack years: 10.00    Types: Cigarettes    Quit date: 04/15/2015    Years since quitting: 4.6   Smokeless  tobacco: Never Used  Substance Use Topics   Alcohol use: No    Alcohol/week: 0.0 standard drinks   Drug use: No    Review of Systems  Constitutional: No fever/chills Eyes: No visual changes. ENT: No sore throat. Cardiovascular: Denies chest pain. Respiratory: Denies shortness of breath. Gastrointestinal: No abdominal pain.  Positive for  nausea and vomiting.  No diarrhea.  No constipation.  Positive for flank pain. Genitourinary: Positive for hematuria and dysuria. Musculoskeletal: Negative for back pain. Skin: Negative for rash. Neurological: Negative for headaches, focal weakness or numbness.  ____________________________________________   PHYSICAL EXAM:  VITAL SIGNS: ED Triage Vitals  Enc Vitals Group     BP --      Pulse --      Resp --      Temp --      Temp src --      SpO2 11/24/19 1704 98 %     Weight 11/24/19 1708 195 lb (88.5 kg)     Height 11/24/19 1708 5\' 11"  (1.803 m)     Head Circumference --      Peak Flow --      Pain Score --      Pain Loc --      Pain Edu? --      Excl. in [place]? --     Constitutional: Alert and oriented.  Patient appears uncomfortable and clutching left flank. Eyes: Conjunctivae are normal. Head: Atraumatic. Nose: No congestion/rhinnorhea. Mouth/Throat: Mucous membranes are moist. Neck: Normal ROM Cardiovascular: Tachycardic, regular rhythm. Grossly normal heart sounds. Respiratory: Normal respiratory effort.  No retractions. Lungs CTAB. Gastrointestinal: Soft and nontender. No distention.  No CVA tenderness bilaterally. Genitourinary: deferred Musculoskeletal: No lower extremity tenderness nor edema. Neurologic:  Normal speech and language. No gross focal neurologic deficits are appreciated. Skin:  Skin is warm, dry and intact. No rash noted. Psychiatric: Mood and affect are normal. Speech and behavior are normal.  ____________________________________________   LABS (all labs ordered are listed, but only abnormal results  are displayed)  Labs Reviewed  URINALYSIS, COMPLETE (UACMP) WITH MICROSCOPIC - Abnormal; Notable for the following components:      Result Value   Color, Urine AMBER (*)    APPearance CLOUDY (*)    Glucose, UA >=500 (*)    Hgb urine dipstick LARGE (*)    Ketones, ur 5 (*)    Protein, ur 100 (*)    Nitrite POSITIVE (*)    RBC / HPF >50 (*)    WBC, UA >50 (*)    Bacteria, UA FEW (*)    All other components within normal limits  BASIC METABOLIC PANEL - Abnormal; Notable for the following components:   CO2 16 (*)    Glucose, Bld 135 (*)    Anion gap 19 (*)    All other components within normal limits  CBC WITH DIFFERENTIAL/PLATELET - Abnormal; Notable for the following components:   RBC 6.03 (*)    MCV 77.9 (*)    All other components within normal limits  URINE CULTURE   ____________________________________________  EKG  ED ECG REPORT I, Blake Divine, the attending physician, personally viewed and interpreted this ECG.   Date: 11/24/2019  EKG Time: 17:04  Rate: 138  Rhythm: sinus tachycardia  Axis: Normal  Intervals:none  ST&T Change: None   PROCEDURES  Procedure(s) performed (including Critical Care):  Procedures   ____________________________________________   INITIAL IMPRESSION / ASSESSMENT AND PLAN / ED COURSE       48 year old male with history of recent cystoscopy and left ureteral stent placement presents to the ED with severe left flank pain over the past 2 hours as well as ongoing hematuria.  EKG shows no acute ischemic changes, doubt cardiac etiology of symptoms.  We will need to work-up for possible stent obstruction versus pyelonephritis.  Case discussed with Dr.  Sninsky of urology, who recommends renal ultrasound as well as basic labs and UA.  Will attempt to control patient's pain as well as nausea.  Lab work reassuring however UA does show nitrites as well as clumps of white blood cells.  Ultrasound without any hydronephrosis or evidence of  obstructing stone, stent appears to be functioning appropriately.  I again spoke with Dr. Diamantina Providence who agreed with plan for antibiotics with close outpatient follow-up.  Patient's pain is much better controlled following dose of Dilaudid and Toradol, no further nausea or vomiting.  Heart rate does fluctuate rapidly but is now consistently much improved, doubt sepsis.  Dr. Diamantina Providence will speak with the patient tomorrow and I counseled patient to return to the ED for new or worsening symptoms, patient and family agree with plan.      ____________________________________________   FINAL CLINICAL IMPRESSION(S) / ED DIAGNOSES  Final diagnoses:  Flank pain  Gross hematuria  Urinary tract infection with hematuria, site unspecified     ED Discharge Orders         Ordered    oxyCODONE-acetaminophen (PERCOCET) 5-325 MG tablet  Every 4 hours PRN     11/24/19 2029    ketorolac (TORADOL) 10 MG tablet  Every 6 hours PRN     11/24/19 2029    ondansetron (ZOFRAN ODT) 4 MG disintegrating tablet  Every 8 hours PRN     11/24/19 2029    sulfamethoxazole-trimethoprim (BACTRIM DS) 800-160 MG tablet  2 times daily     11/24/19 2029           Note:  This document was prepared using Dragon voice recognition software and may include unintentional dictation errors.   Blake Divine, MD 11/24/19 2041

## 2019-11-25 ENCOUNTER — Telehealth: Payer: Self-pay | Admitting: Urology

## 2019-11-25 LAB — URINE CULTURE: Culture: NO GROWTH

## 2019-11-25 LAB — SURGICAL PATHOLOGY

## 2019-11-25 NOTE — Telephone Encounter (Signed)
-----   Message from Billey Co, MD sent at 11/22/2019  9:25 AM EST ----- Regarding: stent removal Please schedule stent removal in 7-10 days, ok to Dorene Ar, MD 11/22/2019

## 2019-11-25 NOTE — Telephone Encounter (Signed)
Done

## 2019-11-26 ENCOUNTER — Encounter: Payer: Self-pay | Admitting: Emergency Medicine

## 2019-11-26 ENCOUNTER — Telehealth: Payer: Self-pay

## 2019-11-26 ENCOUNTER — Other Ambulatory Visit: Payer: Self-pay

## 2019-11-26 ENCOUNTER — Encounter: Payer: 59 | Admitting: Physical Therapy

## 2019-11-26 ENCOUNTER — Telehealth: Payer: Self-pay | Admitting: Urology

## 2019-11-26 DIAGNOSIS — R109 Unspecified abdominal pain: Secondary | ICD-10-CM | POA: Diagnosis not present

## 2019-11-26 DIAGNOSIS — Z7982 Long term (current) use of aspirin: Secondary | ICD-10-CM | POA: Diagnosis not present

## 2019-11-26 DIAGNOSIS — I10 Essential (primary) hypertension: Secondary | ICD-10-CM | POA: Diagnosis not present

## 2019-11-26 DIAGNOSIS — Z79899 Other long term (current) drug therapy: Secondary | ICD-10-CM | POA: Insufficient documentation

## 2019-11-26 DIAGNOSIS — Z87891 Personal history of nicotine dependence: Secondary | ICD-10-CM | POA: Insufficient documentation

## 2019-11-26 LAB — URINALYSIS, COMPLETE (UACMP) WITH MICROSCOPIC
Bacteria, UA: NONE SEEN
RBC / HPF: >50 RBC/hpf — ABNORMAL HIGH (ref 0–5)
Specific Gravity, Urine: 1.023 (ref 1.005–1.030)
Squamous Epithelial / HPF: NONE SEEN (ref 0–5)

## 2019-11-26 LAB — CBC WITH DIFFERENTIAL/PLATELET
Abs Immature Granulocytes: 0.04 10*3/uL (ref 0.00–0.07)
Basophils Absolute: 0.1 10*3/uL (ref 0.0–0.1)
Basophils Relative: 1 %
Eosinophils Absolute: 0.4 10*3/uL (ref 0.0–0.5)
Eosinophils Relative: 5 %
HCT: 42.1 % (ref 39.0–52.0)
Hemoglobin: 13.8 g/dL (ref 13.0–17.0)
Immature Granulocytes: 1 %
Lymphocytes Relative: 23 %
Lymphs Abs: 1.5 10*3/uL (ref 0.7–4.0)
MCH: 26.4 pg (ref 26.0–34.0)
MCHC: 32.8 g/dL (ref 30.0–36.0)
MCV: 80.5 fL (ref 80.0–100.0)
Monocytes Absolute: 0.5 10*3/uL (ref 0.1–1.0)
Monocytes Relative: 7 %
Neutro Abs: 4.3 10*3/uL (ref 1.7–7.7)
Neutrophils Relative %: 63 %
Platelets: 244 10*3/uL (ref 150–400)
RBC: 5.23 MIL/uL (ref 4.22–5.81)
RDW: 12.2 % (ref 11.5–15.5)
WBC: 6.7 10*3/uL (ref 4.0–10.5)
nRBC: 0 % (ref 0.0–0.2)

## 2019-11-26 LAB — COMPREHENSIVE METABOLIC PANEL
ALT: 26 U/L (ref 0–44)
AST: 32 U/L (ref 15–41)
Albumin: 4.1 g/dL (ref 3.5–5.0)
Alkaline Phosphatase: 77 U/L (ref 38–126)
Anion gap: 16 — ABNORMAL HIGH (ref 5–15)
BUN: 17 mg/dL (ref 6–20)
CO2: 18 mmol/L — ABNORMAL LOW (ref 22–32)
Calcium: 9.3 mg/dL (ref 8.9–10.3)
Chloride: 102 mmol/L (ref 98–111)
Creatinine, Ser: 0.93 mg/dL (ref 0.61–1.24)
GFR calc Af Amer: >60 mL/min (ref >60)
GFR calc non Af Amer: >60 mL/min (ref >60)
Glucose, Bld: 153 mg/dL — ABNORMAL HIGH (ref 70–99)
Potassium: 3.8 mmol/L (ref 3.5–5.1)
Sodium: 136 mmol/L (ref 135–145)
Total Bilirubin: 0.9 mg/dL (ref 0.3–1.2)
Total Protein: 7 g/dL (ref 6.5–8.1)

## 2019-11-26 MED ORDER — ONDANSETRON HCL 4 MG/2ML IJ SOLN
4.0000 mg | Freq: Once | INTRAMUSCULAR | Status: AC
  Administered 2019-11-26: 4 mg via INTRAVENOUS
  Filled 2019-11-26: qty 2

## 2019-11-26 MED ORDER — MORPHINE SULFATE (PF) 4 MG/ML IV SOLN
4.0000 mg | Freq: Once | INTRAVENOUS | Status: AC
  Administered 2019-11-26: 4 mg via INTRAVENOUS
  Filled 2019-11-26: qty 1

## 2019-11-26 NOTE — Telephone Encounter (Signed)
Please tell him to push fluids, dark urine normal with the stent and possible UTI, continue abx, keep scheduled follow up for stent removal.

## 2019-11-26 NOTE — Telephone Encounter (Signed)
Pt called office stated that his urine has gotten darker red in color and having small clots, no pain today and was advised to call the office, Please advise pt at [phone].

## 2019-11-26 NOTE — Telephone Encounter (Signed)
Called pt informed him of the information below. Pt gave verbal understanding. Pt states that he is feeling much better, still some mild discomfort and fatigue. He will call back if symptoms worsen.

## 2019-11-26 NOTE — ED Triage Notes (Addendum)
Pt st on Friday had bladder biopsy and kidney stent placed for 68mm stone ; dx with kidney infection Sunday and dx with antibiotics; today having dark red urine with left flank pain radiating around into abd

## 2019-11-26 NOTE — Telephone Encounter (Signed)
-----   Message from Billey Co, MD sent at 11/25/2019  1:16 PM EST ----- Doristine Devoid news, no cancer in bladder biopsy. Just some dilated blood vessels and inflammation. Keep follow up for stent removal, and call us mid week if not doing better with stent pain.  Nickolas Madrid, MD 11/25/2019

## 2019-11-26 NOTE — Telephone Encounter (Signed)
Called pt informed him of the information below. Pt gave verbal understanding.  

## 2019-11-27 ENCOUNTER — Emergency Department
Admission: EM | Admit: 2019-11-27 | Discharge: 2019-11-27 | Disposition: A | Discharge status: Home / Self Care | Payer: 59 | Attending: Emergency Medicine | Admitting: Emergency Medicine

## 2019-11-27 ENCOUNTER — Emergency Department: Payer: 59

## 2019-11-27 ENCOUNTER — Encounter: Payer: Self-pay | Admitting: Urology

## 2019-11-27 ENCOUNTER — Telehealth: Payer: Self-pay | Admitting: Urology

## 2019-11-27 ENCOUNTER — Ambulatory Visit (INDEPENDENT_AMBULATORY_CARE_PROVIDER_SITE_OTHER): Payer: 59 | Admitting: Urology

## 2019-11-27 VITALS — BP 172/121 | HR 132

## 2019-11-27 DIAGNOSIS — N200 Calculus of kidney: Secondary | ICD-10-CM

## 2019-11-27 DIAGNOSIS — R109 Unspecified abdominal pain: Secondary | ICD-10-CM

## 2019-11-27 MED ORDER — FENTANYL CITRATE (PF) 100 MCG/2ML IJ SOLN
50.0000 ug | Freq: Once | INTRAMUSCULAR | Status: AC
  Administered 2019-11-27: 50 ug via INTRAVENOUS
  Filled 2019-11-27: qty 2

## 2019-11-27 MED ORDER — HALOPERIDOL LACTATE 5 MG/ML IJ SOLN
5.0000 mg | Freq: Once | INTRAMUSCULAR | Status: AC
  Administered 2019-11-27: 5 mg via INTRAVENOUS
  Filled 2019-11-27: qty 1

## 2019-11-27 NOTE — Telephone Encounter (Signed)
When should patient be scheduled for stent removal?

## 2019-11-27 NOTE — ED Provider Notes (Signed)
[facility] Emergency Department Provider Note  ____________________________________________   I have reviewed the triage vital signs and the nursing notes.   HISTORY  Chief Complaint Post-op Problem   History limited by: Not Limited   HPI John Hartman is a 48 y.o. male who presents to the emergency department today because of concern for left sided flank pain. Patient recently had left ureteral stent placed and lithotripsy performed. Did have to come to the emergency department afterwards because of concern for pain. The patient states he has been taking the pain medication that was prescribed at home without sufficient relief. The pain came back yesterday. Denies any fevers. He has noticed associated darkening of urine.   Records reviewed. Per medical record review patient has a history of recent cystoscopy with urinary stent placement. ED visit 3 days ago for left sided flank pain.   Past Medical History:  Diagnosis Date  . Angioedema   . Bronchitis   . DDD (degenerative disc disease), cervical   . Diabetes mellitus ([place])   . Fatty liver 03/31/2017   [place] April 2018  . GERD (gastroesophageal reflux disease)   . Gout   . Heart attack ([place])   . HTN (hypertension)   . Hypertension    CONTROLLED ON MEDS  . IBS (irritable bowel syndrome)   . Kidney stone   . LVH (left ventricular hypertrophy) 11/24/2018   Moderate, ECHO  . Myocardial infarction ([place]) 08/23/2018  . Seasonal allergies   . Spinal stenosis     Patient Active Problem List   Diagnosis Date Noted  . Arthritis of right acromioclavicular joint 09/23/2019  . Biceps tendonitis, unspecified laterality 09/23/2019  . Bursitis of right shoulder 09/23/2019  . Incomplete tear of right rotator cuff 09/23/2019  . 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 Tachycardia 03/24/2019  . Status post myocardial infarction 11/24/2018  . LVH (left ventricular hypertrophy) 11/24/2018  . Retrolisthesis of vertebrae  11/24/2018  . History of angioedema 09/01/2018  . NSTEMI (non-ST elevated myocardial infarction) ([place]) 08/22/2018  . Elevated hemoglobin ([place]) 08/11/2017  . Chronic lower back pain 08/04/2017  . Degenerative disc disease, lumbar 08/04/2017  . 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 Calcification of abdominal aorta (H[facility]) 08/04/2017  . Fatty liver 03/31/2017  . Elevated liver enzymes 02/11/2017  . Serum total bilirubin elevated 02/11/2017  . Hyperlipidemia LDL goal <70 01/04/2017  . Elevated serum glutamic pyruvic transaminase (SGPT) level 01/04/2017  . Angioedema   . Encounter for medication monitoring 08/25/2016  . Coronary artery disease 08/25/2016  . Uncontrolled diabetes mellitus with hyperglycemia ([place])   . Chest pain 08/08/2016  . Preventative health care 08/02/2016  . Ketonuria 07/12/2016  . Glucosuria 07/12/2016  . 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 Palpitations 08/12/2015  . Family history of malignant neoplasm of gastrointestinal tract   . Benign neoplasm of rectosigmoid junction   . Essential hypertension 05/28/2015  . Gout 05/28/2015  . IBS (irritable bowel syndrome) 05/28/2015    Past Surgical History:  Procedure Laterality Date  . ACDF with fusion  2007  . CARDIAC CATHETERIZATION Left 08/15/2016   Procedure: Left Heart Cath and Coronary Angiography;  Surgeon: Wellington Hampshire, MD;  Location: [place] [facility];  Service: Cardiovascular;  Laterality: Left;  . COLONOSCOPY WITH PROPOFOL N/A 06/26/2015   Procedure: COLONOSCOPY WITH PROPOFOL;  Surgeon: Lucilla Lame, MD;  Location: [place];  Service: Endoscopy;  Laterality: N/A;  with biopsies  . CORONARY/GRAFT ACUTE MI REVASCULARIZATION N/A 08/23/2018   Procedure: Coronary/Graft Acute  MI Revascularization;  Surgeon: Isaias Cowman, MD;  Location: [place] [facility];  Service: Cardiovascular;  Laterality: N/A;  . CYSTOSCOPY WITH BIOPSY N/A 11/22/2019   Procedure: CYSTOSCOPY WITH Bladder BIOPSY &  Almyra Free;  Surgeon: Billey Co, MD;  Location: [facility];  Service: Urology;  Laterality: N/A;  . CYSTOSCOPY/URETEROSCOPY/HOLMIUM LASER/STENT PLACEMENT Left 11/22/2019   Procedure: CYSTOSCOPY/URETEROSCOPY/BILATERAL RETROGRADE PYELOGRAM/HOLMIUM LASER/STENT PLACEMENT;  Surgeon: Billey Co, MD;  Location: [facility];  Service: Urology;  Laterality: Left;  . LEFT HEART CATH AND CORONARY ANGIOGRAPHY N/A 08/23/2018   Procedure: LEFT HEART CATH AND CORONARY ANGIOGRAPHY;  Surgeon: Isaias Cowman, MD;  Location: [place] [facility];  Service: Cardiovascular;  Laterality: N/A;  . NASAL SEPTUM SURGERY  2004  . SHOULDER SURGERY Left 2007  . Testicular torsion  1980s  . VASECTOMY      Prior to Admission medications   Medication Sig Start Date End Date Taking? Authorizing Provider  ACCU-CHEK SOFTCLIX LANCETS lancets USE AS DIRECTED 07/23/16   Arnetha Courser, MD  allopurinol (ZYLOPRIM) 100 MG tablet Take 100 mg by mouth daily.     [provider]  aspirin EC 81 MG tablet Take 81 mg by mouth daily.    [provider]  atorvastatin (LIPITOR) 80 MG tablet Take 1 tablet (80 mg total) by mouth daily at 6 PM. Patient taking differently: Take 80 mg by mouth daily with supper.  08/25/18 11/19/20  Saundra Shelling, MD  BELBUCA 450 MCG FILM Place 1 Film inside cheek 2 (two) times daily. Patient not taking: Reported on 11/20/2019 07/17/19   Gillis Santa, MD  cetirizine (ZYRTEC) 10 MG tablet Take 10 mg by mouth at bedtime.    [provider]  Dapagliflozin-metFORMIN HCl ER (XIGDUO XR) 04-999 MG TB24 Take 2 tablets by mouth daily.     [provider]  diphenhydrAMINE (BENADRYL) 25 MG tablet Take 50 mg by mouth at bedtime.    [provider]  ezetimibe (ZETIA) 10 MG tablet Take 1 tablet (10 mg total) by mouth daily. 12/03/18   Arnetha Courser, MD  famotidine (PEPCID) 20 MG tablet Take 20 mg by mouth at bedtime.    [provider]  hydrALAZINE  (APRESOLINE) 25 MG tablet Take 1 tablet (25 mg total) by mouth 2 (two) times daily. 08/25/18 11/19/20  Saundra Shelling, MD  ketorolac (TORADOL) 10 MG tablet Take 1 tablet (10 mg total) by mouth every 6 (six) hours as needed. 11/24/19   Blake Divine, MD  Melatonin 5 MG TABS Take 10 mg by mouth at bedtime.     [provider]  Menthol, Topical Analgesic, (BIOFREEZE EX) Apply 1 application topically 3 (three) times daily as needed (muscle cramps/shoulder pain.).    [provider]  metoprolol tartrate (LOPRESSOR) 25 MG tablet Take 25 mg by mouth 2 (two) times daily. 08/22/19   [provider]  nitroGLYCERIN (NITROSTAT) 0.4 MG SL tablet Place 1 tablet (0.4 mg total) under the tongue every 5 (five) minutes as needed for chest pain. 08/25/18   Saundra Shelling, MD  nystatin-triamcinolone ointment (MYCOLOG) Apply 1 application topically 2 (two) times daily. 11/12/19   Billey Co, MD  ondansetron (ZOFRAN ODT) 4 MG disintegrating tablet Take 1 tablet (4 mg total) by mouth every 8 (eight) hours as needed for nausea or vomiting. 11/24/19   Blake Divine, MD  [facility] VERIO test strip USE 2 (TWO) TIMES DAILY USE AS INSTRUCTED. 09/16/19   [provider]  oxybutynin (DITROPAN XL) 10 MG  24 hr tablet Take 1 tablet (10 mg total) by mouth daily as needed for up to 10 days (bladder spasm/bladder pain from stent). 11/22/19 12/02/19  Billey Co, MD  oxyCODONE-acetaminophen (PERCOCET) 5-325 MG tablet Take 1 tablet by mouth every 4 (four) hours as needed for severe pain. 11/24/19 11/23/20  Blake Divine, MD  pregabalin (LYRICA) 50 MG capsule Take 1 capsule (50 mg total) by mouth 3 (three) times daily. Patient not taking: Reported on 11/20/2019 07/17/19   Gillis Santa, MD  RYBELSUS 3 MG TABS Take 3 mg by mouth daily at 6 (six) AM.    [provider]  Semaglutide (RYBELSUS) 7 MG TABS Take by mouth. 11/25/19   [provider]  sulfamethoxazole-trimethoprim  (BACTRIM DS) 800-160 MG tablet Take 1 tablet by mouth 2 (two) times daily for 10 days. 11/24/19 12/04/19  Blake Divine, MD  tamsulosin (FLOMAX) 0.4 MG CAPS capsule Take 1 capsule (0.4 mg total) by mouth daily after supper. 11/22/19   Billey Co, MD  tiZANidine (ZANAFLEX) 4 MG tablet Take 1 tablet (4 mg total) by mouth every 8 (eight) hours as needed for muscle spasms. 07/17/19   Gillis Santa, MD  vitamin B-12 (CYANOCOBALAMIN) 1000 MCG tablet Take 1,000 mcg by mouth 3 (three) times a week.    [provider]    Allergies Lisinopril and Ace inhibitors  Family History  Problem Relation Age of Onset  . Breast cancer Mother   . Skin cancer Mother   . Arrhythmia Mother        A-fib  . Cancer Mother        breast, colon?, skin  . Hypertension Father   . Diabetes Father   . Cancer Maternal Grandfather   . Heart disease Brother        stent  . Allergies Son   . Alzheimer's disease Maternal Grandmother   . Stroke Neg Hx   . COPD Neg Hx     Social History Social History   Tobacco Use  . Smoking status: Former Smoker    Packs/day: 1.00    Years: 10.00    Pack years: 10.00    Types: Cigarettes    Quit date: 04/15/2015    Years since quitting: 4.6  . Smokeless tobacco: Never Used  Substance Use Topics  . Alcohol use: No    Alcohol/week: 0.0 standard drinks  . Drug use: No    Review of Systems Constitutional: No fever/chills Eyes: No visual changes. ENT: No sore throat. Cardiovascular: Denies chest pain. Respiratory: Denies shortness of breath. Gastrointestinal: Positive for left flank pain.  Genitourinary: Positive for dark urine.  Musculoskeletal: Negative for back pain. Skin: Negative for rash. Neurological: Negative for headaches, focal weakness or numbness.  ____________________________________________   PHYSICAL EXAM:  VITAL SIGNS: ED Triage Vitals  Enc Vitals Group     BP 11/26/19 2233 132/90     Pulse Rate 11/26/19 2233 95     Resp 11/26/19  2233 20     Temp 11/26/19 2233 98.1 F (36.7 C)     Temp Source 11/26/19 2233 Oral     SpO2 11/26/19 2233 95 %     Weight 11/26/19 2237 207 lb (93.9 kg)     Height 11/26/19 2237 5\' 11"  (1.803 m)     Head Circumference --      Peak Flow --      Pain Score 11/26/19 2237 10   Constitutional: Alert and oriented.  Eyes: Conjunctivae are normal.  ENT  Head: Normocephalic and atraumatic.      Nose: No congestion/rhinnorhea.      Mouth/Throat: Mucous membranes are moist.      Neck: No stridor. Hematological/Lymphatic/Immunilogical: No cervical lymphadenopathy. Cardiovascular: Normal rate, regular rhythm.  No murmurs, rubs, or gallops.  Respiratory: Normal respiratory effort without tachypnea nor retractions. Breath sounds are clear and equal bilaterally. No wheezes/rales/rhonchi. Gastrointestinal: Soft and tender to palpation on the left side.  Genitourinary: Deferred Musculoskeletal: Normal range of motion in all extremities. No lower extremity edema. Neurologic:  Normal speech and language. No gross focal neurologic deficits are appreciated.  Skin:  Skin is warm, dry and intact. No rash noted. Psychiatric: Mood and affect are normal. Speech and behavior are normal. Patient exhibits appropriate insight and judgment.  ____________________________________________    LABS (pertinent positives/negatives)  CMP wnl except co2 18, glu 153 UA hazy, >50 rbc, 11-20 wbc CBC wbc 6.7, hgb 13.8, plt 244 ____________________________________________   EKG  None  ____________________________________________    RADIOLOGY  Abdomen x-ray Left ureteral stent in place.   US renal No hydronephrosis. Bilateral non obstructing kidney stones.  ____________________________________________   PROCEDURES  Procedures  ____________________________________________   INITIAL IMPRESSION / ASSESSMENT AND PLAN / ED COURSE  Pertinent labs & imaging results that were available during my care  of the patient were reviewed by me and considered in my medical decision making (see chart for details).   Patient presented to the emergency department today because of concerns for continued left flank pain after a recent cystoscopy and lithotripsy with ureteral stent placement.  Patient did have an x-ray performed at triage which showed well-placed ureteral stent.  I did perform an ultrasound which did not show any hydronephrosis.  Patient's urine did have plenty of red blood cells however decreasing white blood cells from previous urine.  Patient is still taking antibiotics.  I discussed with Dr. Bernardo Heater with urology.  At this time will plan on having patient call urology clinic in the morning for possible stent removal.  In terms of pain control patient stated he was not getting great relief with narcotic medications.  Did try Haldol which appeared to give good symptomatic relief.    ____________________________________________   FINAL CLINICAL IMPRESSION(S) / ED DIAGNOSES  Final diagnoses:  Left flank pain     Note: This dictation was prepared with Dragon dictation. Any transcriptional errors that result from this process are unintentional     Nance Pear, MD 11/27/19 [phone]

## 2019-11-27 NOTE — Telephone Encounter (Signed)
Patient notified scheduled today at 11:30

## 2019-11-27 NOTE — Patient Instructions (Signed)
Cystoscopy, Care After Refer to this sheet in the next few weeks. These instructions provide you with information about caring for yourself after your procedure. Your health care provider may also give you more specific instructions. Your treatment has been planned according to current medical practices, but problems sometimes occur. Call your health care provider if you have any problems or questions after your procedure. What can I expect after the procedure? After the procedure, it is common to have:  Mild pain in your bladder or kidney area during urination.  Minor burning during urination.  Small amounts of blood in your urine.  A sudden urge to urinate.  A need to urinate more often than usual. Follow these instructions at home: Medicines  Take over-the-counter and prescription medicines only as told by your health care provider.  If you were prescribed an antibiotic medicine, take it as told by your health care provider. Do not stop taking the antibiotic even if you start to feel better. General instructions   Take a warm bath to relieve any burning sensations around your urethra.  Hold a warm, damp washcloth over the urethral area to ease pain.  Return to your normal activities as told by your health care provider. Ask your health care provider what activities are safe for you.  Do not drive for 24 hours if you received a medicine to help you relax (sedative) during your procedure. Ask your health care provider when it is safe for you to drive.  It is your responsibility to get the results of your procedure. Ask your health care provider or the department performing the procedure when your results will be ready.  Keep all follow-up visits as told by your health care provider. This is important. Contact a health care provider if:  You have a fever.  Your symptoms do not improve within 24 hours and you continue to have: ? Burning during urination. ? Increasing amounts of  blood in your urine. ? Pain during urination. ? An urgent need to urinate. ? A need to urinate more often than usual. Get help right away if:  You have a lot of bleeding or more bleeding.  You have severe pain.  You are unable to urinate.  You have bright red blood in your urine.  You are passing blood clots in your urine.  You have a fever.  You have swelling, redness, or pain in your legs.  You have difficulty breathing. This information is not intended to replace advice given to you by your health care provider. Make sure you discuss any questions you have with your health care provider. Document Released: 12/29/2015 Document Revised: 05/19/2016 Document Reviewed: 12/29/2015 Elsevier Patient Education  2020 [employer].

## 2019-11-27 NOTE — ED Notes (Signed)
Patient transported to ultrasound.

## 2019-11-27 NOTE — Discharge Instructions (Addendum)
As we discussed please contact Urology clinic first thing in the morning. They can discuss with you about removing the stent. Please return for any fevers. Please continue to take your antibiotics.

## 2019-11-27 NOTE — Telephone Encounter (Signed)
It can come out today or overbook it for me tomorrow at noon  His urine cx was negative 11/29  Nickolas Madrid, MD 11/27/2019

## 2019-11-27 NOTE — ED Notes (Signed)
Patient resting at this time.

## 2019-11-27 NOTE — ED Notes (Signed)
Patient continues to request pain medication, MD aware

## 2019-11-27 NOTE — Telephone Encounter (Signed)
Pt called and states that he was seen in the ER last night and was to call the office first thing and possibly have stent removal.Please advise.

## 2019-11-27 NOTE — ED Notes (Signed)
ED Provider at bedside. 

## 2019-11-27 NOTE — Progress Notes (Signed)
   11/27/19  CC:  Chief Complaint  Patient presents with  . Cysto Stent Removal    HPI:  John Hartman underwent Cystoscopy, bladder biopsy and fulguration, bilateral retrograde pyelograms with intraoperative interpretation, left ureteroscopy, laser lithotripsy, left ureteral stent placement with Dr. Diamantina Providence 11/22/2019. A left ureteral stent was placed. Pt having a lot of stent pain and he was added on today to get the stent removed. WBC was 6.7. Ua no bacteria. His urine cx was negative 11/24/2019. He's had no fever or dysuria. Renal US benign. KUB left stent in good position.   Blood pressure (!) 172/121, pulse (!) 132. NED. A&Ox3.   No respiratory distress   Abd soft, NT, ND Normal phallus with bilateral descended testicles  Cystoscopy Procedure Note  Patient identification was confirmed, informed consent was obtained, and patient was prepped using Betadine solution.  Lidocaine jelly was administered per urethral meatus.     Pre-Procedure: - Inspection reveals a normal caliber ureteral meatus.  Procedure: The flexible cystoscope was introduced without difficulty. The left stent was grasped and removed intact without difficulty. [name] Roch was Environmental consultant. He is on abx.   Post-Procedure: - Patient tolerated the procedure well  Assessment/ Plan:  F/u with Dr. Diamantina Providence as planned in 4 weeks with renal US    No follow-ups on file.  Festus Aloe, MD

## 2019-11-28 ENCOUNTER — Telehealth: Payer: Self-pay | Admitting: Urology

## 2019-11-28 ENCOUNTER — Encounter: Payer: 59 | Admitting: Physical Therapy

## 2019-11-28 NOTE — Telephone Encounter (Signed)
Talk to patient and let him know it is normal for him to noy=tice some brown urine after stent removal. He understands. Drink water

## 2019-11-28 NOTE — Telephone Encounter (Signed)
Pt called and would like a call back, he states that he is seeing an increase in brown urine and wants to know if this is normal after stent removal.

## 2019-12-02 ENCOUNTER — Other Ambulatory Visit: Payer: 59 | Admitting: Urology

## 2019-12-03 ENCOUNTER — Other Ambulatory Visit: Payer: Self-pay

## 2019-12-03 ENCOUNTER — Ambulatory Visit: Payer: No Typology Code available for payment source | Attending: Orthopedic Surgery | Admitting: Physical Therapy

## 2019-12-03 ENCOUNTER — Encounter: Payer: Self-pay | Admitting: Physical Therapy

## 2019-12-03 DIAGNOSIS — M6281 Muscle weakness (generalized): Secondary | ICD-10-CM | POA: Diagnosis present

## 2019-12-03 DIAGNOSIS — M25611 Stiffness of right shoulder, not elsewhere classified: Secondary | ICD-10-CM | POA: Diagnosis present

## 2019-12-03 DIAGNOSIS — G8929 Other chronic pain: Secondary | ICD-10-CM

## 2019-12-03 DIAGNOSIS — M25511 Pain in right shoulder: Secondary | ICD-10-CM | POA: Diagnosis present

## 2019-12-03 DIAGNOSIS — M75121 Complete rotator cuff tear or rupture of right shoulder, not specified as traumatic: Secondary | ICD-10-CM | POA: Diagnosis not present

## 2019-12-03 NOTE — Therapy (Signed)
[facility] [address] [place], [place], [address] Phone: [phone]   Fax:  [phone]  Physical Therapy Treatment  Patient Details  Name: John Hartman MRN: [id number] Date of Birth: 17-May-1971 Referring Provider (PT): Laure Kidney, MD   Encounter Date: 12/03/2019  PT End of Session - 12/03/19 1654    Visit Number  14    Number of Visits  22    Date for PT Re-Evaluation  12/31/19    Authorization - Visit Number  1    Authorization - Number of Visits  10    PT Start Time  1644    PT Stop Time  1732    PT Time Calculation (min)  48 min    Activity Tolerance  Patient tolerated treatment well;Patient limited by pain    Behavior During Therapy  [facility] for tasks assessed/performed       Past Medical History:  Diagnosis Date  . Angioedema   . Bronchitis   . DDD (degenerative disc disease), cervical   . Diabetes mellitus ([place])   . Fatty liver 03/31/2017   [place] April 2018  . GERD (gastroesophageal reflux disease)   . Gout   . Heart attack ([place])   . HTN (hypertension)   . Hypertension    CONTROLLED ON MEDS  . IBS (irritable bowel syndrome)   . Kidney stone   . LVH (left ventricular hypertrophy) 11/24/2018   Moderate, ECHO  . Myocardial infarction ([place]) 08/23/2018  . Seasonal allergies   . Spinal stenosis     Past Surgical History:  Procedure Laterality Date  . ACDF with fusion  2007  . CARDIAC CATHETERIZATION Left 08/15/2016   Procedure: Left Heart Cath and Coronary Angiography;  Surgeon: Wellington Hampshire, MD;  Location: [place] [facility];  Service: Cardiovascular;  Laterality: Left;  . COLONOSCOPY WITH PROPOFOL N/A 06/26/2015   Procedure: COLONOSCOPY WITH PROPOFOL;  Surgeon: Lucilla Lame, MD;  Location: [place];  Service: Endoscopy;  Laterality: N/A;  with biopsies  . CORONARY/GRAFT ACUTE MI REVASCULARIZATION N/A 08/23/2018   Procedure: Coronary/Graft Acute MI Revascularization;  Surgeon: Isaias Cowman, MD;   Location: [place] [facility];  Service: Cardiovascular;  Laterality: N/A;  . CYSTOSCOPY WITH BIOPSY N/A 11/22/2019   Procedure: CYSTOSCOPY WITH Bladder BIOPSY & Almyra Free;  Surgeon: Billey Co, MD;  Location: [facility];  Service: Urology;  Laterality: N/A;  . CYSTOSCOPY/URETEROSCOPY/HOLMIUM LASER/STENT PLACEMENT Left 11/22/2019   Procedure: CYSTOSCOPY/URETEROSCOPY/BILATERAL RETROGRADE PYELOGRAM/HOLMIUM LASER/STENT PLACEMENT;  Surgeon: Billey Co, MD;  Location: [facility];  Service: Urology;  Laterality: Left;  . LEFT HEART CATH AND CORONARY ANGIOGRAPHY N/A 08/23/2018   Procedure: LEFT HEART CATH AND CORONARY ANGIOGRAPHY;  Surgeon: Isaias Cowman, MD;  Location: [place] [facility];  Service: Cardiovascular;  Laterality: N/A;  . NASAL SEPTUM SURGERY  2004  . SHOULDER SURGERY Left 2007  . Testicular torsion  1980s  . VASECTOMY      There were no vitals filed for this visit.  Subjective Assessment - 12/04/19 1752    Subjective  Pt. had a rough week with kidney stones/ surgical procedure/ infection/ hospital visit 2x.  Pt. states he is doing better but does not want to overdo it with strengthening today.  Pt. presents with good R shoulder AROM prior to tx. session.    Pertinent History  pt. known to PT.  Pt. is planning on lumbar fusion once he has completely recovered from shoulder.  Pt. has adhesive allergy from tape.  Patient Stated Goals  Increase R shoulder AROM/ strength/ pain-free mobility.    Currently in Pain?  No/denies         [facility] PT Assessment - 12/04/19 0001      Assessment   Medical Diagnosis  Incomplete tear of right rotator cuff, Bursitis of right shoulder, Arthritis of right acromioclavicular joint, Biceps tendonitis    Referring Provider (PT)  Laure Kidney, MD    Onset Date/Surgical Date  10/01/19    Hand Dominance  Right      Home Environment   Living Environment  Private residence      Prior Function   Level of Independence  Independent       Cognition   Overall Cognitive Status  Within Functional Limits for tasks assessed        There.ex.:   B UBE 3 min. F/b (consistent cadence)- no increase pain Nautilus: 70# lat. Pull downs/ 50# tricep extension/ 50# sh. Adduction with handles/ 50# scap. Retraction 30x (increase wts. Today) Seated/ standing R sh. A/AROM (all planes) as tolerated.  Goal reassessment    PT Long Term Goals - 12/03/19 1655      PT LONG TERM GOAL #1   Title  Pt. I with HEP to increase R shoulder PROM to [facility] as compared to L to progress to AROM/ MD protocol.    Baseline  supine R shoulder AAROM per MD protocol: flexion 152 deg./ abduction 128 deg./ ER 42 deg. (pain limited)/ IR 64 deg.    Time  4    Period  Weeks    Status  Achieved    Target Date  10/31/19      PT LONG TERM GOAL #2   Title  Pt. will complete FOTO and score norm values to improve R shoulder functional mobility.    Baseline  TBD    Time  4    Period  Weeks    Status  On-going    Target Date  12/31/19      PT LONG TERM GOAL #3   Title  Pt. will complete grip strength testing and score >10% of L to improve grasping/ strengthening.    Baseline  R: 109.1#/ L: 110.2#    Time  4    Period  Weeks    Status  Partially Met    Target Date  12/31/19      PT LONG TERM GOAL #4   Title  Pt. will start R shoulder AROM and demonstrate >90 deg. flexion/ abduction to improve progression of HEP/ daily tasks.    Baseline  supine R shoulder AAROM per MD protocol: flexion 152 deg./ abduction 128 deg./ ER 42 deg. (pain limited)/ IR 64 deg.  On 12/03/2019: see note    Time  4    Period  Weeks    Status  Achieved    Target Date  12/03/19      PT LONG TERM GOAL #5   Title  Pt. will increase R shoulder strength to WNL as compared to L shoulder to improve mobility.    Baseline  R shoulder strength grossly 4+/5 MMT except biceps/ triceps 5/5 MMT, ER 4/5 MMT.    Time  4    Period  Weeks    Status  New    Target Date  12/31/19             Plan - 12/04/19 1756    Clinical Impression Statement  Pt. has not been seen by PT over 2 weeks  secondary to kidney stone/ infection/ procedure.  Pt. presents with marked increase in R shoulder AROM in sitting: flexion 157 deg./ abduction 156 deg.  R shoulder strength grossly 4+/5 MMT except biceps/ triceps 5/5 MMT, ER 4/5 MMT.  Minimal to no tenderness in R shoulder with palpation/ good capsule mobility.  Grip strength: R 109.1#/ L 110.2#.  No change to HEP at this time.  Pt. will continue to benefit from skilled PT services to maximize R shoulder strength to improve pain-free functional mobility.    Stability/Clinical Decision Making  Evolving/Moderate complexity    Clinical Decision Making  Moderate    Rehab Potential  Good    PT Frequency  2x / week    PT Duration  4 weeks    PT Treatment/Interventions  ADLs/Self Care Home Management;Aquatic Therapy;Cryotherapy;Electrical Stimulation;Traction;Dry needling;Passive range of motion;Manual techniques;Neuromuscular re-education;Patient/family education;Therapeutic exercise;Therapeutic activities;Functional mobility training;Gait training    PT Next Visit Plan  Issue new HEP    PT Home Exercise Plan  Access Code: [email or web address]   Access Code: [email or web address] (wand ex).  Access Code: [email or web address] (strengthening).    Consulted and Agree with Plan of Care  Patient       Patient will benefit from skilled therapeutic intervention in order to improve the following deficits and impairments:  Pain, Improper body mechanics, Postural dysfunction, Decreased activity tolerance, Decreased endurance, Decreased range of motion, Decreased strength, Hypomobility, Difficulty walking, Impaired flexibility, Impaired UE functional use  Visit Diagnosis: Nontraumatic complete tear of right rotator cuff  Shoulder joint stiffness, right  Muscle weakness (generalized)  Chronic right shoulder pain     Problem List Patient Active Problem List   Diagnosis Date Noted   . Arthritis of right acromioclavicular joint 09/23/2019  . Biceps tendonitis, unspecified laterality 09/23/2019  . Bursitis of right shoulder 09/23/2019  . Incomplete tear of right rotator cuff 09/23/2019  . 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 Tachycardia 03/24/2019  . Status post myocardial infarction 11/24/2018  . LVH (left ventricular hypertrophy) 11/24/2018  . 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 History of angioedema 09/01/2018  . NSTEMI (non-ST elevated myocardial infarction) ([place]) 08/22/2018  . Elevated hemoglobin ([place]) 08/11/2017  . Chronic lower back pain 08/04/2017  . Degenerative disc disease, lumbar 08/04/2017  . 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 Calcification of abdominal aorta ([facility]) 08/04/2017  . Fatty liver 03/31/2017  . Elevated liver enzymes 02/11/2017  . Serum total bilirubin elevated 02/11/2017  . Hyperlipidemia LDL goal <70 01/04/2017  . Elevated serum glutamic pyruvic transaminase (SGPT) level 01/04/2017  . Angioedema   . Encounter for medication monitoring 08/25/2016  . Coronary artery disease 08/25/2016  . Uncontrolled diabetes mellitus with hyperglycemia ([place])   . Chest pain 08/08/2016  . Preventative health care 08/02/2016  . Ketonuria 07/12/2016  . Glucosuria 07/12/2016  . 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 Palpitations 08/12/2015  . Family history of malignant neoplasm of gastrointestinal tract   . Benign neoplasm of rectosigmoid junction   . Essential hypertension 05/28/2015  . Gout 05/28/2015  . IBS (irritable bowel syndrome) 05/28/2015   Pura Spice, PT, DPT # [id number] 12/04/2019, 6:05 PM  [facility] [address] [place], [place], [address] Phone: [phone]   Fax:  [phone]  Name: John Hartman MRN: [id number] Date of Birth: 1971-07-02

## 2019-12-05 ENCOUNTER — Other Ambulatory Visit: Payer: Self-pay

## 2019-12-05 ENCOUNTER — Encounter: Payer: Self-pay | Admitting: Physical Therapy

## 2019-12-05 ENCOUNTER — Ambulatory Visit: Payer: No Typology Code available for payment source | Admitting: Physical Therapy

## 2019-12-05 DIAGNOSIS — G8929 Other chronic pain: Secondary | ICD-10-CM

## 2019-12-05 DIAGNOSIS — M6281 Muscle weakness (generalized): Secondary | ICD-10-CM

## 2019-12-05 DIAGNOSIS — M25511 Pain in right shoulder: Secondary | ICD-10-CM

## 2019-12-05 DIAGNOSIS — M25611 Stiffness of right shoulder, not elsewhere classified: Secondary | ICD-10-CM

## 2019-12-05 DIAGNOSIS — M75121 Complete rotator cuff tear or rupture of right shoulder, not specified as traumatic: Secondary | ICD-10-CM | POA: Diagnosis not present

## 2019-12-05 NOTE — Therapy (Signed)
[facility] [address] [place], [place], [address] Phone: [phone]   Fax:  [phone]  Physical Therapy Treatment  Patient Details  Name: John Hartman MRN: [id number] Date of Birth: [birth year] Referring Provider (PT): Laure Kidney, MD   Encounter Date: 12/05/2019  PT End of Session - 12/05/19 1655    Visit Number  15    Number of Visits  22    Date for PT Re-Evaluation  12/31/19    Authorization - Visit Number  2    Authorization - Number of Visits  10    PT Start Time  1324    PT Stop Time  4010    PT Time Calculation (min)  49 min    Activity Tolerance  Patient tolerated treatment well;Patient limited by pain    Behavior During Therapy  [facility] for tasks assessed/performed       Past Medical History:  Diagnosis Date  . Angioedema   . Bronchitis   . DDD (degenerative disc disease), cervical   . Diabetes mellitus ([place])   . Fatty liver 03/31/2017   [place] April 2018  . GERD (gastroesophageal reflux disease)   . Gout   . Heart attack ([place])   . HTN (hypertension)   . Hypertension    CONTROLLED ON MEDS  . IBS (irritable bowel syndrome)   . Kidney stone   . LVH (left ventricular hypertrophy) 11/24/2018   Moderate, ECHO  . Myocardial infarction ([place]) 08/23/2018  . Seasonal allergies   . Spinal stenosis     Past Surgical History:  Procedure Laterality Date  . ACDF with fusion  2007  . CARDIAC CATHETERIZATION Left 08/15/2016   Procedure: Left Heart Cath and Coronary Angiography;  Surgeon: Wellington Hampshire, MD;  Location: [place] [facility];  Service: Cardiovascular;  Laterality: Left;  . COLONOSCOPY WITH PROPOFOL N/A 06/26/2015   Procedure: COLONOSCOPY WITH PROPOFOL;  Surgeon: Lucilla Lame, MD;  Location: [place];  Service: Endoscopy;  Laterality: N/A;  with biopsies  . CORONARY/GRAFT ACUTE MI REVASCULARIZATION N/A 08/23/2018   Procedure: Coronary/Graft Acute MI Revascularization;  Surgeon: Isaias Cowman, MD;   Location: [place] [facility];  Service: Cardiovascular;  Laterality: N/A;  . CYSTOSCOPY WITH BIOPSY N/A 11/22/2019   Procedure: CYSTOSCOPY WITH Bladder BIOPSY & Almyra Free;  Surgeon: Billey Co, MD;  Location: [facility];  Service: Urology;  Laterality: N/A;  . CYSTOSCOPY/URETEROSCOPY/HOLMIUM LASER/STENT PLACEMENT Left 11/22/2019   Procedure: CYSTOSCOPY/URETEROSCOPY/BILATERAL RETROGRADE PYELOGRAM/HOLMIUM LASER/STENT PLACEMENT;  Surgeon: Billey Co, MD;  Location: [facility];  Service: Urology;  Laterality: Left;  . LEFT HEART CATH AND CORONARY ANGIOGRAPHY N/A 08/23/2018   Procedure: LEFT HEART CATH AND CORONARY ANGIOGRAPHY;  Surgeon: Isaias Cowman, MD;  Location: [place] [facility];  Service: Cardiovascular;  Laterality: N/A;  . NASAL SEPTUM SURGERY  2004  . SHOULDER SURGERY Left 2007  . Testicular torsion  1980s  . VASECTOMY      There were no vitals filed for this visit.  Subjective Assessment - 12/05/19 1651    Subjective  Pt. did well after last tx. session.  No increase c/o pain.  Pt. states kidney/bladder issues have been stable past 3 days.    Pertinent History  pt. known to PT.  Pt. is planning on lumbar fusion once he has completely recovered from shoulder.  Pt. has adhesive allergy from tape.    Patient Stated Goals  Increase R shoulder AROM/ strength/ pain-free mobility.    Currently in Pain?  No/denies        There.ex.:   B UBE 3 min. F/b (consistent cadence)- no increase pain Wall push-ups 20x.   Nautilus: 70# lat. Pull downs/ 50# tricep extension/ 50# sh. Adduction with handles/ 50# scap. Retraction/ 30# sh. Diagonals/ 20# IR/ 10# ER 30x (increase wts. Today) Seated/ standing R sh. A/AROM (all planes) as tolerated. See new HEP (issued [facility]).       PT Long Term Goals - 12/03/19 1655      PT LONG TERM GOAL #1   Title  Pt. I with HEP to increase R shoulder PROM to [facility] as compared to L to progress to AROM/ MD protocol.    Baseline  supine R  shoulder AAROM per MD protocol: flexion 152 deg./ abduction 128 deg./ ER 42 deg. (pain limited)/ IR 64 deg.    Time  4    Period  Weeks    Status  Achieved    Target Date  10/31/19      PT LONG TERM GOAL #2   Title  Pt. will complete FOTO and score norm values to improve R shoulder functional mobility.    Baseline  TBD    Time  4    Period  Weeks    Status  On-going    Target Date  12/31/19      PT LONG TERM GOAL #3   Title  Pt. will complete grip strength testing and score >10% of L to improve grasping/ strengthening.    Baseline  R: 109.1#/ L: 110.2#    Time  4    Period  Weeks    Status  Partially Met    Target Date  12/31/19      PT LONG TERM GOAL #4   Title  Pt. will start R shoulder AROM and demonstrate >90 deg. flexion/ abduction to improve progression of HEP/ daily tasks.    Baseline  supine R shoulder AAROM per MD protocol: flexion 152 deg./ abduction 128 deg./ ER 42 deg. (pain limited)/ IR 64 deg.  On 12/03/2019: see note    Time  4    Period  Weeks    Status  Achieved    Target Date  12/03/19      PT LONG TERM GOAL #5   Title  Pt. will increase R shoulder strength to WNL as compared to L shoulder to improve mobility.    Baseline  R shoulder strength grossly 4+/5 MMT except biceps/ triceps 5/5 MMT, ER 4/5 MMT.    Time  4    Period  Weeks    Status  New    Target Date  12/31/19            Plan - 12/06/19 0934    Clinical Impression Statement  Pt. presents with good B shoulder AROM with no pain.  Pt. states he has stiffness in R shoulder with overhead reaching but funcitonal mobility noted.  Pt. will focus on shoulder strengthening ex. program 3-4x/week to maximize R shoulder strength/ independence.  Good technique with ther.ex. on Nautilus today with minimal UT activation.  Pt. instructed to avoid pain provoking movement patterns and use ice for any inflammation.    Stability/Clinical Decision Making  Evolving/Moderate complexity    Clinical Decision Making   Moderate    Rehab Potential  Good    PT Frequency  2x / week    PT Duration  4 weeks    PT Treatment/Interventions  ADLs/Self Care Home Management;Aquatic Therapy;Cryotherapy;Electrical Stimulation;Traction;Dry needling;Passive range  of motion;Manual techniques;Neuromuscular re-education;Patient/family education;Therapeutic exercise;Therapeutic activities;Functional mobility training;Gait training    PT Next Visit Plan  Discuss MD f/u appt.    PT Home Exercise Plan  Access Code: [email or web address]   Access Code: [email or web address] (wand ex).  Access Code: [email or web address] (strengthening).    Consulted and Agree with Plan of Care  Patient       Patient will benefit from skilled therapeutic intervention in order to improve the following deficits and impairments:  Pain, Improper body mechanics, Postural dysfunction, Decreased activity tolerance, Decreased endurance, Decreased range of motion, Decreased strength, Hypomobility, Difficulty walking, Impaired flexibility, Impaired UE functional use  Visit Diagnosis: Nontraumatic complete tear of right rotator cuff  Shoulder joint stiffness, right  Muscle weakness (generalized)  Chronic right shoulder pain     Problem List Patient Active Problem List   Diagnosis Date Noted  . Arthritis of right acromioclavicular joint 09/23/2019  . Biceps tendonitis, unspecified laterality 09/23/2019  . Bursitis of right shoulder 09/23/2019  . Incomplete tear of right rotator cuff 09/23/2019  . 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 Tachycardia 03/24/2019  . Status post myocardial infarction 11/24/2018  . LVH (left ventricular hypertrophy) 11/24/2018  . 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 History of angioedema 09/01/2018  . NSTEMI (non-ST elevated myocardial infarction) ([place]) 08/22/2018  . Elevated hemoglobin ([place]) 08/11/2017  . Chronic lower back pain 08/04/2017  . Degenerative disc disease, lumbar 08/04/2017  . 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 Calcification of  abdominal aorta (HCC) 08/04/2017  . Fatty liver 03/31/2017  . Elevated liver enzymes 02/11/2017  . Serum total bilirubin elevated 02/11/2017  . Hyperlipidemia LDL goal <70 01/04/2017  . Elevated serum glutamic pyruvic transaminase (SGPT) level 01/04/2017  . Angioedema   . Encounter for medication monitoring 08/25/2016  . Coronary artery disease 08/25/2016  . Uncontrolled diabetes mellitus with hyperglycemia ([place])   . Chest pain 08/08/2016  . Preventative health care 08/02/2016  . Ketonuria 07/12/2016  . Glucosuria 07/12/2016  . 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 Palpitations 08/12/2015  . Family history of malignant neoplasm of gastrointestinal tract   . Benign neoplasm of rectosigmoid junction   . Essential hypertension 05/28/2015  . Gout 05/28/2015  . IBS (irritable bowel syndrome) 05/28/2015   Pura Spice, PT, DPT # [phone] 12/06/2019, 11:06 AM  [facility] [address] [place], [place], [address] Phone: [phone]   Fax:  [phone]  Name: John Hartman MRN: [id number] Date of Birth: Jun 29, 1971

## 2019-12-05 NOTE — Patient Instructions (Signed)
Access Code: [email or web address]  URL: [email or web address]  Date: 12/05/2019  Prepared by: Dorcas Carrow   Exercises  Scapular Retraction with Resistance - 15 reps - 2 sets - 1x daily - 4x weekly  Standing Single Arm Elbow Flexion with Resistance - 15 reps - 2 sets - 1x daily - 4x weekly  Standing Tricep Extensions with Resistance - 15 reps - 2 sets - 1x daily - 4x weekly  Standing Shoulder External Rotation with Resistance - 15 reps - 2 sets - 1x daily - 4x weekly  Standing Single Arm Shoulder Flexion with Posterior Anchored Resistance - 15 reps - 2 sets - 1x daily - 4x weekly

## 2019-12-10 ENCOUNTER — Ambulatory Visit: Payer: No Typology Code available for payment source | Admitting: Physical Therapy

## 2019-12-12 ENCOUNTER — Other Ambulatory Visit: Payer: Self-pay

## 2019-12-12 ENCOUNTER — Encounter: Payer: Self-pay | Admitting: Physical Therapy

## 2019-12-12 ENCOUNTER — Ambulatory Visit: Payer: No Typology Code available for payment source | Admitting: Physical Therapy

## 2019-12-12 DIAGNOSIS — M6281 Muscle weakness (generalized): Secondary | ICD-10-CM

## 2019-12-12 DIAGNOSIS — M25511 Pain in right shoulder: Secondary | ICD-10-CM

## 2019-12-12 DIAGNOSIS — M75121 Complete rotator cuff tear or rupture of right shoulder, not specified as traumatic: Secondary | ICD-10-CM

## 2019-12-12 DIAGNOSIS — M25611 Stiffness of right shoulder, not elsewhere classified: Secondary | ICD-10-CM

## 2019-12-12 DIAGNOSIS — G8929 Other chronic pain: Secondary | ICD-10-CM

## 2019-12-12 NOTE — Therapy (Addendum)
[facility] [address] [place], [place], [address] Phone: [phone]   Fax:  [phone]  Physical Therapy Treatment  Patient Details  Name: John Hartman MRN: [id number] Date of Birth: 05-Aug-1971 Referring Provider (PT): Laure Kidney, MD   Encounter Date: 12/12/2019    Treatment: 16 of 22.  Recert date: 03/27/7407 1448 to 1735   Past Medical History:  Diagnosis Date  . Angioedema   . Bronchitis   . DDD (degenerative disc disease), cervical   . Diabetes mellitus ([place])   . Fatty liver 03/31/2017   [place] April 2018  . GERD (gastroesophageal reflux disease)   . Gout   . Heart attack ([place])   . HTN (hypertension)   . Hypertension    CONTROLLED ON MEDS  . IBS (irritable bowel syndrome)   . Kidney stone   . LVH (left ventricular hypertrophy) 11/24/2018   Moderate, ECHO  . Myocardial infarction ([place]) 08/23/2018  . Seasonal allergies   . Spinal stenosis     Past Surgical History:  Procedure Laterality Date  . ACDF with fusion  2007  . CARDIAC CATHETERIZATION Left 08/15/2016   Procedure: Left Heart Cath and Coronary Angiography;  Surgeon: Wellington Hampshire, MD;  Location: [place] [facility];  Service: Cardiovascular;  Laterality: Left;  . COLONOSCOPY WITH PROPOFOL N/A 06/26/2015   Procedure: COLONOSCOPY WITH PROPOFOL;  Surgeon: Lucilla Lame, MD;  Location: [place];  Service: Endoscopy;  Laterality: N/A;  with biopsies  . CORONARY/GRAFT ACUTE MI REVASCULARIZATION N/A 08/23/2018   Procedure: Coronary/Graft Acute MI Revascularization;  Surgeon: Isaias Cowman, MD;  Location: [place] [facility];  Service: Cardiovascular;  Laterality: N/A;  . CYSTOSCOPY WITH BIOPSY N/A 11/22/2019   Procedure: CYSTOSCOPY WITH Bladder BIOPSY & Almyra Free;  Surgeon: Billey Co, MD;  Location: [facility];  Service: Urology;  Laterality: N/A;  . CYSTOSCOPY/URETEROSCOPY/HOLMIUM LASER/STENT PLACEMENT Left 11/22/2019   Procedure:  CYSTOSCOPY/URETEROSCOPY/BILATERAL RETROGRADE PYELOGRAM/HOLMIUM LASER/STENT PLACEMENT;  Surgeon: Billey Co, MD;  Location: [facility];  Service: Urology;  Laterality: Left;  . LEFT HEART CATH AND CORONARY ANGIOGRAPHY N/A 08/23/2018   Procedure: LEFT HEART CATH AND CORONARY ANGIOGRAPHY;  Surgeon: Isaias Cowman, MD;  Location: [place];  Service: Cardiovascular;  Laterality: N/A;  . NASAL SEPTUM SURGERY  2004  . SHOULDER SURGERY Left 2007  . Testicular torsion  1980s  . VASECTOMY      There were no vitals filed for this visit.      Pt. states he is a little sore in R shoulder but no pain. Pt. reports MD f/u went well but MD does not want strengthening at this time.         There.ex.:   B UBE 3 min. F/b (consistent cadence)- no increase pain  No Nautilus ex. Today until PT talks with MD  Seated/ standing R sh. A/AROM (all planes) as tolerated. See new HEP (revamped ex.).    Manual tx.:  Supine R shoulder PROM (all planes) STM to R shoulder/ UT/ scapular musculature.      PT Long Term Goals - 12/03/19 1655      PT LONG TERM GOAL #1   Title  Pt. I with HEP to increase R shoulder PROM to [facility] as compared to L to progress to AROM/ MD protocol.    Baseline  supine R shoulder AAROM per MD protocol: flexion 152 deg./ abduction 128 deg./ ER 42 deg. (pain limited)/ IR 64 deg.    Time  4  Period  Weeks    Status  Achieved    Target Date  10/31/19      PT LONG TERM GOAL #2   Title  Pt. will complete FOTO and score norm values to improve R shoulder functional mobility.    Baseline  TBD    Time  4    Period  Weeks    Status  On-going    Target Date  12/31/19      PT LONG TERM GOAL #3   Title  Pt. will complete grip strength testing and score >10% of L to improve grasping/ strengthening.    Baseline  R: 109.1#/ L: 110.2#    Time  4    Period  Weeks    Status  Partially Met    Target Date  12/31/19      PT LONG TERM GOAL #4   Title  Pt. will  start R shoulder AROM and demonstrate >90 deg. flexion/ abduction to improve progression of HEP/ daily tasks.    Baseline  supine R shoulder AAROM per MD protocol: flexion 152 deg./ abduction 128 deg./ ER 42 deg. (pain limited)/ IR 64 deg.  On 12/03/2019: see note    Time  4    Period  Weeks    Status  Achieved    Target Date  12/03/19      PT LONG TERM GOAL #5   Title  Pt. will increase R shoulder strength to WNL as compared to L shoulder to improve mobility.    Baseline  R shoulder strength grossly 4+/5 MMT except biceps/ triceps 5/5 MMT, ER 4/5 MMT.    Time  4    Period  Weeks    Status  New    Target Date  12/31/19            Plan - 12/15/19 1203    Clinical Impression Statement  PT revised tx. session and current HEP to avoid any resistive ex. per MD appt.  Pt. continues to demonstrate consistent progress with R shoulder A/AROM.  PT focused on shoulder/ pec/ scapular stretches.  PT emailed pts. MD to clarify when/ type of resistive ex. is allowed per MD protocol.  PT will reassess pts. goals next tx. session.    Stability/Clinical Decision Making  Evolving/Moderate complexity    Clinical Decision Making  Moderate    Rehab Potential  Good    PT Frequency  2x / week    PT Duration  4 weeks    PT Treatment/Interventions  ADLs/Self Care Home Management;Aquatic Therapy;Cryotherapy;Electrical Stimulation;Traction;Dry needling;Passive range of motion;Manual techniques;Neuromuscular re-education;Patient/family education;Therapeutic exercise;Therapeutic activities;Functional mobility training;Gait training    PT Next Visit Plan  Reassess goals/ contact MD to clarify resistive ex.    PT Home Exercise Plan  Access Code: [email or web address]   Access Code: [email or web address] (wand ex).  Access Code: [email or web address] (strengthening).    Consulted and Agree with Plan of Care  Patient       Patient will benefit from skilled therapeutic intervention in order to improve the following deficits and impairments:  Pain,  Improper body mechanics, Postural dysfunction, Decreased activity tolerance, Decreased endurance, Decreased range of motion, Decreased strength, Hypomobility, Difficulty walking, Impaired flexibility, Impaired UE functional use  Visit Diagnosis: Nontraumatic complete tear of right rotator cuff  Shoulder joint stiffness, right  Muscle weakness (generalized)  Chronic right shoulder pain     Problem List Patient Active Problem List   Diagnosis Date Noted  . Arthritis of right acromioclavicular joint 09/23/2019  .  Biceps tendonitis, unspecified laterality 09/23/2019  . Bursitis of right shoulder 09/23/2019  . Incomplete tear of right rotator cuff 09/23/2019  . 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 Tachycardia 03/24/2019  . Status post myocardial infarction 11/24/2018  . LVH (left ventricular hypertrophy) 11/24/2018  . 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 History of angioedema 09/01/2018  . NSTEMI (non-ST elevated myocardial infarction) ([place]) 08/22/2018  . Elevated hemoglobin ([place]) 08/11/2017  . Chronic lower back pain 08/04/2017  . Degenerative disc disease, lumbar 08/04/2017  . 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 Calcification of abdominal aorta (HCC) 08/04/2017  . Fatty liver 03/31/2017  . Elevated liver enzymes 02/11/2017  . Serum total bilirubin elevated 02/11/2017  . Hyperlipidemia LDL goal <70 01/04/2017  . Elevated serum glutamic pyruvic transaminase (SGPT) level 01/04/2017  . Angioedema   . Encounter for medication monitoring 08/25/2016  . Coronary artery disease 08/25/2016  . Uncontrolled diabetes mellitus with hyperglycemia ([place])   . Chest pain 08/08/2016  . Preventative health care 08/02/2016  . Ketonuria 07/12/2016  . Glucosuria 07/12/2016  . 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 Palpitations 08/12/2015  . Family history of malignant neoplasm of gastrointestinal tract   . Benign neoplasm of rectosigmoid junction   . Essential hypertension 05/28/2015  . Gout  05/28/2015  . IBS (irritable bowel syndrome) 05/28/2015   Pura Spice, PT, DPT # [phone] 12/15/2019, 2:25 PM  [facility] [address] [place], [place], [address] Phone: [phone]   Fax:  [phone]  Name: John Hartman MRN: [id number] Date of Birth: 30-Oct-1971

## 2019-12-13 ENCOUNTER — Other Ambulatory Visit: Payer: Self-pay

## 2019-12-13 ENCOUNTER — Ambulatory Visit
Admission: RE | Admit: 2019-12-13 | Discharge: 2019-12-13 | Disposition: A | Discharge status: Home / Self Care | Payer: No Typology Code available for payment source | Source: Ambulatory Visit | Attending: Urology | Admitting: Urology

## 2019-12-13 DIAGNOSIS — N200 Calculus of kidney: Secondary | ICD-10-CM | POA: Insufficient documentation

## 2019-12-15 ENCOUNTER — Other Ambulatory Visit: Payer: Self-pay

## 2019-12-15 ENCOUNTER — Encounter: Payer: Self-pay | Admitting: Emergency Medicine

## 2019-12-15 ENCOUNTER — Ambulatory Visit
Admission: EM | Admit: 2019-12-15 | Discharge: 2019-12-15 | Disposition: A | Discharge status: Home / Self Care | Payer: No Typology Code available for payment source | Attending: Family Medicine | Admitting: Family Medicine

## 2019-12-15 DIAGNOSIS — H6123 Impacted cerumen, bilateral: Secondary | ICD-10-CM | POA: Diagnosis not present

## 2019-12-15 DIAGNOSIS — R110 Nausea: Secondary | ICD-10-CM

## 2019-12-15 DIAGNOSIS — R42 Dizziness and giddiness: Secondary | ICD-10-CM | POA: Diagnosis not present

## 2019-12-15 LAB — CBC WITH DIFFERENTIAL/PLATELET
Abs Immature Granulocytes: 0.03 10*3/uL (ref 0.00–0.07)
Basophils Absolute: 0 10*3/uL (ref 0.0–0.1)
Basophils Relative: 0 %
Eosinophils Absolute: 0.4 10*3/uL (ref 0.0–0.5)
Eosinophils Relative: 6 %
HCT: 49.6 % (ref 39.0–52.0)
Hemoglobin: 16 g/dL (ref 13.0–17.0)
Immature Granulocytes: 0 %
Lymphocytes Relative: 23 %
Lymphs Abs: 1.6 10*3/uL (ref 0.7–4.0)
MCH: 26.5 pg (ref 26.0–34.0)
MCHC: 32.3 g/dL (ref 30.0–36.0)
MCV: 82.1 fL (ref 80.0–100.0)
Monocytes Absolute: 0.5 10*3/uL (ref 0.1–1.0)
Monocytes Relative: 8 %
Neutro Abs: 4.3 10*3/uL (ref 1.7–7.7)
Neutrophils Relative %: 63 %
Platelets: 220 10*3/uL (ref 150–400)
RBC: 6.04 MIL/uL — ABNORMAL HIGH (ref 4.22–5.81)
RDW: 13.4 % (ref 11.5–15.5)
WBC: 6.9 10*3/uL (ref 4.0–10.5)
nRBC: 0 % (ref 0.0–0.2)

## 2019-12-15 LAB — BASIC METABOLIC PANEL
Anion gap: 10 (ref 5–15)
BUN: 13 mg/dL (ref 6–20)
CO2: 27 mmol/L (ref 22–32)
Calcium: 9.8 mg/dL (ref 8.9–10.3)
Chloride: 101 mmol/L (ref 98–111)
Creatinine, Ser: 0.94 mg/dL (ref 0.61–1.24)
GFR calc Af Amer: >60 mL/min (ref >60)
GFR calc non Af Amer: >60 mL/min (ref >60)
Glucose, Bld: 115 mg/dL — ABNORMAL HIGH (ref 70–99)
Potassium: 4.9 mmol/L (ref 3.5–5.1)
Sodium: 138 mmol/L (ref 135–145)

## 2019-12-15 MED ORDER — MECLIZINE HCL 25 MG PO TABS
25.0000 mg | ORAL_TABLET | Freq: Once | ORAL | Status: AC
  Administered 2019-12-15: 25 mg via ORAL

## 2019-12-15 MED ORDER — MECLIZINE HCL 25 MG PO TABS: 25.0000 mg | ORAL_TABLET | Freq: Three times a day (TID) | ORAL | 0 refills | Status: DC | PRN

## 2019-12-15 MED ORDER — ONDANSETRON 8 MG PO TBDP
8.0000 mg | ORAL_TABLET | Freq: Once | ORAL | Status: AC
  Administered 2019-12-15: 09:00:00 8 mg via ORAL

## 2019-12-15 MED ORDER — ONDANSETRON 8 MG PO TBDP: 8.0000 mg | ORAL_TABLET | Freq: Three times a day (TID) | ORAL | 0 refills | Status: DC | PRN

## 2019-12-15 NOTE — Discharge Instructions (Addendum)
Take medication as prescribed. Rest. Drink plenty of fluids.   Follow up with your primary care physician this week. Return to Urgent care as needed.  Proceed directly to emergency room for increased or worsening complaints.

## 2019-12-15 NOTE — ED Provider Notes (Signed)
[facility] ____________________________________________  Time seen: Approximately 8:55 AM  I have reviewed the triage vital signs and the nursing notes.   HISTORY  Chief Complaint Dizziness and Nausea   HPI John Hartman is a 48 y.o. male past medical history of hypertension, GERD, diabetes, NSTEMI and kidney stones presenting for evaluation of nausea and dizziness.  Patient reports this past Monday he began having some intermittent nausea.  Reports on Tuesday he had the same with 2 episodes of vomiting.  Reports he has continued to have intermittent episodes of nausea, no other vomiting.  Continues to have normal bowel movements, denies diarrhea or constipation.  Denies any pain associated with this, but reports has had some intermittent headaches at night.  Reports this morning when he got up to go to the bathroom he had dizziness.  Describes the dizziness as room spinning sensation and then had accompanying nausea again.  No more vomiting.  States now has dizziness intermittently, and noticed that with movement changes.  Denies dizziness at this time sitting still.  Has continued to overall eat and drink well, but somewhat of a decreased appetite.  Denies any chest pain, shortness of breath, paresthesias, unilateral weakness, vision change, weakness, cough, congestion, sore throat, palpitations.  Denies known sick contacts.  Denies recent sickness, but does report his wife mentioned that he was complaining of right ear pain this week. Denies fevers.  Has continued checking his blood sugars at home, this a.m. was 95.   Past Medical History:  Diagnosis Date  . Angioedema   . Bronchitis   . DDD (degenerative disc disease), cervical   . Diabetes mellitus ([place])   . Fatty liver 03/31/2017   [place] April 2018  . GERD (gastroesophageal reflux disease)   . Gout   . Heart attack ([place])   . HTN (hypertension)   . Hypertension    CONTROLLED ON MEDS  . IBS (irritable bowel syndrome)   .  Kidney stone   . LVH (left ventricular hypertrophy) 11/24/2018   Moderate, ECHO  . Myocardial infarction ([place]) 08/23/2018  . Seasonal allergies   . Spinal stenosis     Patient Active Problem List   Diagnosis Date Noted  . Arthritis of right acromioclavicular joint 09/23/2019  . Biceps tendonitis, unspecified laterality 09/23/2019  . Bursitis of right shoulder 09/23/2019  . Incomplete tear of right rotator cuff 09/23/2019  . 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 Tachycardia 03/24/2019  . Status post myocardial infarction 11/24/2018  . LVH (left ventricular hypertrophy) 11/24/2018  . 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 History of angioedema 09/01/2018  . NSTEMI (non-ST elevated myocardial infarction) ([place]) 08/22/2018  . Elevated hemoglobin ([place]) 08/11/2017  . Chronic lower back pain 08/04/2017  . Degenerative disc disease, lumbar 08/04/2017  . 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 Calcification of abdominal aorta (HCC) 08/04/2017  . Fatty liver 03/31/2017  . Elevated liver enzymes 02/11/2017  . Serum total bilirubin elevated 02/11/2017  . Hyperlipidemia LDL goal <70 01/04/2017  . Elevated serum glutamic pyruvic transaminase (SGPT) level 01/04/2017  . Angioedema   . Encounter for medication monitoring 08/25/2016  . Coronary artery disease 08/25/2016  . Uncontrolled diabetes mellitus with hyperglycemia ([place])   . Chest pain 08/08/2016  . Preventative health care 08/02/2016  . Ketonuria 07/12/2016  . Glucosuria 07/12/2016  . 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 Palpitations 08/12/2015  . Family history of malignant neoplasm of gastrointestinal tract   . Benign neoplasm of rectosigmoid junction   . Essential hypertension 05/28/2015  .  Gout 05/28/2015  . IBS (irritable bowel syndrome) 05/28/2015    Past Surgical History:  Procedure Laterality Date  . ACDF with fusion  2007  . CARDIAC CATHETERIZATION Left 08/15/2016   Procedure: Left Heart Cath and Coronary Angiography;   Surgeon: Wellington Hampshire, MD;  Location: [place] [facility];  Service: Cardiovascular;  Laterality: Left;  . COLONOSCOPY WITH PROPOFOL N/A 06/26/2015   Procedure: COLONOSCOPY WITH PROPOFOL;  Surgeon: Lucilla Lame, MD;  Location: [facility];  Service: Endoscopy;  Laterality: N/A;  with biopsies  . CORONARY/GRAFT ACUTE MI REVASCULARIZATION N/A 08/23/2018   Procedure: Coronary/Graft Acute MI Revascularization;  Surgeon: Isaias Cowman, MD;  Location: [place] [facility];  Service: Cardiovascular;  Laterality: N/A;  . CYSTOSCOPY WITH BIOPSY N/A 11/22/2019   Procedure: CYSTOSCOPY WITH Bladder BIOPSY & Almyra Free;  Surgeon: Billey Co, MD;  Location: [facility];  Service: Urology;  Laterality: N/A;  . CYSTOSCOPY/URETEROSCOPY/HOLMIUM LASER/STENT PLACEMENT Left 11/22/2019   Procedure: CYSTOSCOPY/URETEROSCOPY/BILATERAL RETROGRADE PYELOGRAM/HOLMIUM LASER/STENT PLACEMENT;  Surgeon: Billey Co, MD;  Location: [facility];  Service: Urology;  Laterality: Left;  . LEFT HEART CATH AND CORONARY ANGIOGRAPHY N/A 08/23/2018   Procedure: LEFT HEART CATH AND CORONARY ANGIOGRAPHY;  Surgeon: Isaias Cowman, MD;  Location: [facility];  Service: Cardiovascular;  Laterality: N/A;  . NASAL SEPTUM SURGERY  2004  . SHOULDER SURGERY Left 2007  . Testicular torsion  1980s  . VASECTOMY       No current facility-administered medications for this encounter.  Current Outpatient Medications:  .  allopurinol (ZYLOPRIM) 100 MG tablet, Take 100 mg by mouth daily. , Disp: , Rfl:  .  aspirin EC 81 MG tablet, Take 81 mg by mouth daily., Disp: , Rfl:  .  atorvastatin (LIPITOR) 80 MG tablet, Take 1 tablet (80 mg total) by mouth daily at 6 PM. (Patient taking differently: Take 80 mg by mouth daily with supper. ), Disp: 30 tablet, Rfl: 0 .  cetirizine (ZYRTEC) 10 MG tablet, Take 10 mg by mouth at bedtime., Disp: , Rfl:  .  Dapagliflozin-metFORMIN HCl ER (XIGDUO XR) 04-999 MG TB24, Take 2 tablets by  mouth daily. , Disp: , Rfl:  .  ezetimibe (ZETIA) 10 MG tablet, Take 1 tablet (10 mg total) by mouth daily., Disp: 30 tablet, Rfl: 11 .  famotidine (PEPCID) 20 MG tablet, Take 20 mg by mouth at bedtime., Disp: , Rfl:  .  hydrALAZINE (APRESOLINE) 25 MG tablet, Take 1 tablet (25 mg total) by mouth 2 (two) times daily., Disp: 60 tablet, Rfl: 0 .  Melatonin 5 MG TABS, Take 10 mg by mouth at bedtime. , Disp: , Rfl:  .  metoprolol tartrate (LOPRESSOR) 25 MG tablet, Take 25 mg by mouth 2 (two) times daily., Disp: , Rfl:  .  Semaglutide (RYBELSUS) 7 MG TABS, Take by mouth., Disp: , Rfl:  .  vitamin B-12 (CYANOCOBALAMIN) 1000 MCG tablet, Take 1,000 mcg by mouth 3 (three) times a week., Disp: , Rfl:  .  ACCU-CHEK SOFTCLIX LANCETS lancets, USE AS DIRECTED, Disp: 100 each, Rfl: 0 .  BELBUCA 450 MCG FILM, Place 1 Film inside cheek 2 (two) times daily., Disp: 60 each, Rfl: 1 .  diphenhydrAMINE (BENADRYL) 25 MG tablet, Take 50 mg by mouth at bedtime., Disp: , Rfl:  .  ketorolac (TORADOL) 10 MG tablet, Take 1 tablet (10 mg total) by mouth every 6 (six) hours as needed., Disp: 20 tablet, Rfl: 0 .  meclizine (ANTIVERT) 25 MG tablet, Take 1 tablet (  25 mg total) by mouth 3 (three) times daily as needed for dizziness., Disp: 20 tablet, Rfl: 0 .  Menthol, Topical Analgesic, (BIOFREEZE EX), Apply 1 application topically 3 (three) times daily as needed (muscle cramps/shoulder pain.)., Disp: , Rfl:  .  nitroGLYCERIN (NITROSTAT) 0.4 MG SL tablet, Place 1 tablet (0.4 mg total) under the tongue every 5 (five) minutes as needed for chest pain., Disp: 30 tablet, Rfl: 0 .  nystatin-triamcinolone ointment (MYCOLOG), Apply 1 application topically 2 (two) times daily., Disp: 30 g, Rfl: 0 .  ondansetron (ZOFRAN ODT) 8 MG disintegrating tablet, Take 1 tablet (8 mg total) by mouth every 8 (eight) hours as needed for nausea or vomiting., Disp: 20 tablet, Rfl: 0 .  ONETOUCH VERIO test strip, USE 2 (TWO) TIMES DAILY USE AS INSTRUCTED.,  Disp: , Rfl:  .  oxyCODONE-acetaminophen (PERCOCET) 5-325 MG tablet, Take 1 tablet by mouth every 4 (four) hours as needed for severe pain., Disp: 12 tablet, Rfl: 0 .  pregabalin (LYRICA) 50 MG capsule, Take 1 capsule (50 mg total) by mouth 3 (three) times daily., Disp: 90 capsule, Rfl: 2 .  RYBELSUS 3 MG TABS, Take 3 mg by mouth daily at 6 (six) AM., Disp: , Rfl:  .  tamsulosin (FLOMAX) 0.4 MG CAPS capsule, Take 1 capsule (0.4 mg total) by mouth daily after supper., Disp: 14 capsule, Rfl: 0 .  tiZANidine (ZANAFLEX) 4 MG tablet, Take 1 tablet (4 mg total) by mouth every 8 (eight) hours as needed for muscle spasms., Disp: 90 tablet, Rfl: 5  Allergies Lisinopril and Ace inhibitors  Family History  Problem Relation Age of Onset  . Breast cancer Mother   . Skin cancer Mother   . Arrhythmia Mother        A-fib  . Cancer Mother        breast, colon?, skin  . Hypertension Father   . Diabetes Father   . Cancer Maternal Grandfather   . Heart disease Brother        stent  . Allergies Son   . Alzheimer's disease Maternal Grandmother   . Stroke Neg Hx   . COPD Neg Hx     Social History Social History   Tobacco Use  . Smoking status: Former Smoker    Packs/day: 1.00    Years: 10.00    Pack years: 10.00    Types: Cigarettes    Quit date: 04/15/2015    Years since quitting: 4.6  . Smokeless tobacco: Never Used  Substance Use Topics  . Alcohol use: No    Alcohol/week: 0.0 standard drinks  . Drug use: No    Review of Systems Constitutional: No fever Eyes: No visual changes. ENT: No sore throat. Cardiovascular: Denies chest pain. Respiratory: Denies shortness of breath. Gastrointestinal: No abdominal pain. No diarrhea.  No constipation. Genitourinary: Negative for dysuria. Musculoskeletal: Negative for back pain. Skin: Negative for rash. Neurological: Negative for focal weakness or numbness.   ____________________________________________   PHYSICAL EXAM:  VITAL SIGNS:  ED Triage Vitals  Enc Vitals Group     BP 12/15/19 0821 (!) 128/100     Pulse Rate 12/15/19 0821 70     Resp 12/15/19 0821 16     Temp 12/15/19 0821 98.4 F (36.9 C)     Temp Source 12/15/19 0821 Oral     SpO2 12/15/19 0821 100 %     Weight 12/15/19 0816 193 lb (87.5 kg)     Height 12/15/19 0816 5\' 11"  (1.803 m)  Head Circumference --      Peak Flow --      Pain Score 12/15/19 0816 0     Pain Loc --      Pain Edu? --      Excl. in [place]? --     Constitutional: Alert and oriented. Well appearing and in no acute distress. Eyes: Conjunctivae are normal. PERRL. EOMI. ENT      Head: Normocephalic and atraumatic.      Ears:   Right: Nontender, total cerumen impaction, post cerumen removal, canal clear, no erythema, normal TM.  Left: Nontender, total cerumen impaction, post cerumen impaction removal, canal clear except minimal cerumen, no erythema, normal TM.      Nose: No congestion      Mouth/Throat: Mucous membranes are moist.Oropharynx non-erythematous. Neck: No stridor. Supple without meningismus.  Hematological/Lymphatic/Immunilogical: No cervical lymphadenopathy. Cardiovascular: Normal rate, regular rhythm. Grossly normal heart sounds. Good peripheral circulation. Respiratory: Normal respiratory effort without tachypnea nor retractions. Breath sounds are clear and equal bilaterally. No wheezes, rales, rhonchi. Gastrointestinal: Soft and nontender. No CVA tenderness. Musculoskeletal:  Nontender with normal range of motion in all extremities. No midline cervical, thoracic or lumbar tenderness to palpation.  Neurologic:  Normal speech and language. No gross focal neurologic deficits are appreciated. Speech is normal. No gait instability.  Negative pronator drift.  No ataxia.  Positive right Dix-Hallpike. Skin:  Skin is warm, dry and intact. No rash noted. Psychiatric: Mood and affect are normal. Speech and behavior are normal. Patient exhibits appropriate insight and judgment    ___________________________________________   LABS (all labs ordered are listed, but only abnormal results are displayed)  Labs Reviewed  CBC WITH DIFFERENTIAL/PLATELET - Abnormal; Notable for the following components:      Result Value   RBC 6.04 (*)    All other components within normal limits  BASIC METABOLIC PANEL - Abnormal; Notable for the following components:   Glucose, Bld 115 (*)    All other components within normal limits  NOVEL CORONAVIRUS, NAA (HOSP ORDER, SEND-OUT TO REF LAB; TAT 18-24 HRS)    PROCEDURES Procedures   Bilateral cerumen impaction removed by nursing staff with irrigation.  INITIAL IMPRESSION / ASSESSMENT AND PLAN / ED COURSE  Pertinent labs & imaging results that were available during my care of the patient were reviewed by me and considered in my medical decision making (see chart for details).  Overall well-appearing patient.  Discussed multiple differentials with patient, suspect benign positional vertigo and discussed possible viral illness.  COVID-19 testing completed and advice given.  Bilateral cerumen impaction removed, patient tolerated well.  Will treat with as needed Zofran and meclizine.  After cerumen removal, Zofran and meclizine, patient reports feeling much better.  Denies dizziness or nausea at this time.  Encouraged a close follow-up, as well as discussed proceeding directly to emergency room for worsening complaints.Discussed indication, risks and benefits of medications with patient.   Discussed follow up with Primary care physician this week. Discussed follow up and return parameters including no resolution or any worsening concerns. Patient verbalized understanding and agreed to plan.   ____________________________________________   FINAL CLINICAL IMPRESSION(S) / ED DIAGNOSES  Final diagnoses:  Bilateral impacted cerumen  Vertigo  Nausea     ED Discharge Orders         Ordered    meclizine (ANTIVERT) 25 MG tablet  3 times  daily PRN     12/15/19 1016    ondansetron (ZOFRAN ODT) 8 MG disintegrating tablet  Every 8 hours PRN     12/15/19 1016           Note: This dictation was prepared with Dragon dictation along with smaller phrase technology. Any transcriptional errors that result from this process are unintentional.         Marylene Land, NP 12/15/19 1049

## 2019-12-15 NOTE — ED Triage Notes (Signed)
Patient c/o nausea that started on Tuesday.  Patient denies diarrhea.  Patient states that he vomited once on Tuesday.  Patient c/o dizziness that started this morning.  Patient denies fevers.

## 2019-12-16 LAB — NOVEL CORONAVIRUS, NAA (HOSP ORDER, SEND-OUT TO REF LAB; TAT 18-24 HRS): SARS-CoV-2, NAA: NOT DETECTED

## 2019-12-17 ENCOUNTER — Ambulatory Visit: Payer: No Typology Code available for payment source | Admitting: Physical Therapy

## 2019-12-23 ENCOUNTER — Encounter: Payer: Self-pay | Admitting: Family Medicine

## 2019-12-23 ENCOUNTER — Other Ambulatory Visit: Payer: Self-pay

## 2019-12-23 ENCOUNTER — Ambulatory Visit (INDEPENDENT_AMBULATORY_CARE_PROVIDER_SITE_OTHER): Payer: Managed Care, Other (non HMO) | Admitting: Family Medicine

## 2019-12-23 DIAGNOSIS — R110 Nausea: Secondary | ICD-10-CM

## 2019-12-23 DIAGNOSIS — R142 Eructation: Secondary | ICD-10-CM

## 2019-12-23 NOTE — Progress Notes (Signed)
Name: John Hartman   MRN: [id number]    DOB: [birth year]   Date:12/23/2019       Progress Note  Subjective  Chief Complaint  Chief Complaint  Patient presents with  . Abdominal Pain     seen at UC, nausea,no appetite,previous vomiting for 3 weeks on and off    I connected with  [name] on 12/23/19 at  7:20 AM EST by telephone and verified that I am speaking with the correct person using two identifiers.  I discussed the limitations, risks, security and privacy concerns of performing an evaluation and management service by telephone and the availability of in person appointments. Staff also discussed with the patient that there may be a patient responsible charge related to this service. Patient Location: Home Provider Location: Office Additional Individuals present: None  HPI  Pt presents to transfer care from prior PCP Dr. Sanda Klein who is no longer with our practice. On 12/09/2019 he woke up a bit nauseous, the next day he vomited (describes as projectile) x2 - otherwise no vomiting during this illness.  Has been constipated - thinks may be related to decreased appetite/oral intake.  Since 12/09/2019 he has ben experiencing a significant amount of belching, nausea, decreased appetite.  He notes that the last few days have been a bit better - has history duodenitis in spring 2020 - states this does not feel nearly as severe.  However, he did start protonix and carafate about 5 days ago. - He went to UC on 12/15/2019 - was advised possible viral illness, given zofran.  BMP & CBC with no actionable derangements.  COVID testing negative. - Of note, he was started on Rybelsus about 1.5 months ago by his endocrinologist - he notes that these symptoms started about 12 days after increasing from the 3mg  to the 7mg  dosing.   Patient Active Problem List   Diagnosis Date Noted  . Arthritis of right acromioclavicular joint 09/23/2019  . Biceps tendonitis, unspecified laterality 09/23/2019  .  Bursitis of right shoulder 09/23/2019  . Incomplete tear of right rotator cuff 09/23/2019  . 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 Tachycardia 03/24/2019  . Status post myocardial infarction 11/24/2018  . LVH (left ventricular hypertrophy) 11/24/2018  . 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 History of angioedema 09/01/2018  . NSTEMI (non-ST elevated myocardial infarction) ([place]) 08/22/2018  . Elevated hemoglobin ([place]) 08/11/2017  . Chronic lower back pain 08/04/2017  . Degenerative disc disease, lumbar 08/04/2017  . 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 Calcification of abdominal aorta ([facility]) 08/04/2017  . Fatty liver 03/31/2017  . Elevated liver enzymes 02/11/2017  . Serum total bilirubin elevated 02/11/2017  . Hyperlipidemia LDL goal <70 01/04/2017  . Elevated serum glutamic pyruvic transaminase (SGPT) level 01/04/2017  . Angioedema   . Encounter for medication monitoring 08/25/2016  . Coronary artery disease 08/25/2016  . Uncontrolled diabetes mellitus with hyperglycemia ([place])   . Chest pain 08/08/2016  . Preventative health care 08/02/2016  . Ketonuria 07/12/2016  . Glucosuria 07/12/2016  . 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 Palpitations 08/12/2015  . Family history of malignant neoplasm of gastrointestinal tract   . Benign neoplasm of rectosigmoid junction   . Essential hypertension 05/28/2015  . Gout 05/28/2015  . IBS (irritable bowel syndrome) 05/28/2015    Past Surgical History:  Procedure Laterality Date  . ACDF with fusion  2007  . CARDIAC CATHETERIZATION Left 08/15/2016   Procedure: Left Heart Cath and Coronary Angiography;  Surgeon: Wellington Hampshire, MD;  Location:  [facility];  Service: Cardiovascular;  Laterality: Left;  . COLONOSCOPY WITH PROPOFOL N/A 06/26/2015   Procedure: COLONOSCOPY WITH PROPOFOL;  Surgeon: Lucilla Lame, MD;  Location: [facility];  Service: Endoscopy;  Laterality: N/A;  with biopsies  . CORONARY/GRAFT ACUTE MI REVASCULARIZATION  N/A 08/23/2018   Procedure: Coronary/Graft Acute MI Revascularization;  Surgeon: Isaias Cowman, MD;  Location: [facility];  Service: Cardiovascular;  Laterality: N/A;  . CYSTOSCOPY WITH BIOPSY N/A 11/22/2019   Procedure: CYSTOSCOPY WITH Bladder BIOPSY & [place] Free;  Surgeon: Billey Co, MD;  Location: [facility];  Service: Urology;  Laterality: N/A;  . CYSTOSCOPY/URETEROSCOPY/HOLMIUM LASER/STENT PLACEMENT Left 11/22/2019   Procedure: CYSTOSCOPY/URETEROSCOPY/BILATERAL RETROGRADE PYELOGRAM/HOLMIUM LASER/STENT PLACEMENT;  Surgeon: Billey Co, MD;  Location: [facility];  Service: Urology;  Laterality: Left;  . LEFT HEART CATH AND CORONARY ANGIOGRAPHY N/A 08/23/2018   Procedure: LEFT HEART CATH AND CORONARY ANGIOGRAPHY;  Surgeon: Isaias Cowman, MD;  Location: [facility];  Service: Cardiovascular;  Laterality: N/A;  . NASAL SEPTUM SURGERY  2004  . SHOULDER SURGERY Left 2007  . Testicular torsion  1980s  . VASECTOMY      Family History  Problem Relation Age of Onset  . Breast cancer Mother   . Skin cancer Mother   . Arrhythmia Mother        A-fib  . Cancer Mother        breast, colon?, skin  . Hypertension Father   . Diabetes Father   . Cancer Maternal Grandfather   . Heart disease Brother        stent  . Allergies Son   . Alzheimer's disease Maternal Grandmother   . Stroke Neg Hx   . COPD Neg Hx     Social History   Socioeconomic History  . Marital status: Married    Spouse name: Not on file  . Number of children: Not on file  . Years of education: Not on file  . Highest education level: Not on file  Occupational History  . Not on file  Tobacco Use  . Smoking status: Former Smoker    Packs/day: 1.00    Years: 10.00    Pack years: 10.00    Types: Cigarettes    Quit date: 04/15/2015    Years since quitting: 4.6  . Smokeless tobacco: Never Used  Substance and Sexual Activity  . Alcohol use: No    Alcohol/week: 0.0 standard drinks   . Drug use: No  . Sexual activity: Yes  Other Topics Concern  . Not on file  Social History Narrative  . Not on file   Social Determinants of Health   Financial Resource Strain:   . Difficulty of Paying Living Expenses: Not on file  Food Insecurity:   . Worried About Charity fundraiser in the Last Year: Not on file  . Ran Out of Food in the Last Year: Not on file  Transportation Needs:   . Lack of Transportation (Medical): Not on file  . Lack of Transportation (Non-Medical): Not on file  Physical Activity:   . Days of Exercise per Week: Not on file  . Minutes of Exercise per Session: Not on file  Stress:   . Feeling of Stress : Not on file  Social Connections:   . Frequency of Communication with Friends and Family: Not on file  . Frequency of Social Gatherings with Friends and Family: Not on file  . Attends Religious Services: Not on file  .  Active Member of Clubs or Organizations: Not on file  . Attends Archivist Meetings: Not on file  . Marital Status: Not on file  Intimate Partner Violence:   . Fear of Current or Ex-Partner: Not on file  . Emotionally Abused: Not on file  . Physically Abused: Not on file  . Sexually Abused: Not on file     Current Outpatient Medications:  .  ACCU-CHEK SOFTCLIX LANCETS lancets, USE AS DIRECTED, Disp: 100 each, Rfl: 0 .  allopurinol (ZYLOPRIM) 100 MG tablet, Take 100 mg by mouth daily. , Disp: , Rfl:  .  aspirin EC 81 MG tablet, Take 81 mg by mouth daily., Disp: , Rfl:  .  atorvastatin (LIPITOR) 80 MG tablet, Take 1 tablet (80 mg total) by mouth daily at 6 PM. (Patient taking differently: Take 80 mg by mouth daily with supper. ), Disp: 30 tablet, Rfl: 0 .  BELBUCA 450 MCG FILM, Place 1 Film inside cheek 2 (two) times daily., Disp: 60 each, Rfl: 1 .  cetirizine (ZYRTEC) 10 MG tablet, Take 10 mg by mouth at bedtime., Disp: , Rfl:  .  Dapagliflozin-metFORMIN HCl ER (XIGDUO XR) 04-999 MG TB24, Take 2 tablets by mouth daily. ,  Disp: , Rfl:  .  diphenhydrAMINE (BENADRYL) 25 MG tablet, Take 50 mg by mouth at bedtime., Disp: , Rfl:  .  ezetimibe (ZETIA) 10 MG tablet, Take 1 tablet (10 mg total) by mouth daily., Disp: 30 tablet, Rfl: 11 .  famotidine (PEPCID) 20 MG tablet, Take 20 mg by mouth at bedtime., Disp: , Rfl:  .  hydrALAZINE (APRESOLINE) 25 MG tablet, Take 1 tablet (25 mg total) by mouth 2 (two) times daily., Disp: 60 tablet, Rfl: 0 .  ketorolac (TORADOL) 10 MG tablet, Take 1 tablet (10 mg total) by mouth every 6 (six) hours as needed., Disp: 20 tablet, Rfl: 0 .  meclizine (ANTIVERT) 25 MG tablet, Take 1 tablet (25 mg total) by mouth 3 (three) times daily as needed for dizziness., Disp: 20 tablet, Rfl: 0 .  Melatonin 5 MG TABS, Take 10 mg by mouth at bedtime. , Disp: , Rfl:  .  Menthol, Topical Analgesic, (BIOFREEZE EX), Apply 1 application topically 3 (three) times daily as needed (muscle cramps/shoulder pain.)., Disp: , Rfl:  .  metoprolol tartrate (LOPRESSOR) 25 MG tablet, Take 25 mg by mouth 2 (two) times daily., Disp: , Rfl:  .  nitroGLYCERIN (NITROSTAT) 0.4 MG SL tablet, Place 1 tablet (0.4 mg total) under the tongue every 5 (five) minutes as needed for chest pain., Disp: 30 tablet, Rfl: 0 .  nystatin-triamcinolone ointment (MYCOLOG), Apply 1 application topically 2 (two) times daily., Disp: 30 g, Rfl: 0 .  ondansetron (ZOFRAN ODT) 8 MG disintegrating tablet, Take 1 tablet (8 mg total) by mouth every 8 (eight) hours as needed for nausea or vomiting., Disp: 20 tablet, Rfl: 0 .  ONETOUCH VERIO test strip, USE 2 (TWO) TIMES DAILY USE AS INSTRUCTED., Disp: , Rfl:  .  pregabalin (LYRICA) 50 MG capsule, Take 1 capsule (50 mg total) by mouth 3 (three) times daily., Disp: 90 capsule, Rfl: 2 .  RYBELSUS 3 MG TABS, Take 3 mg by mouth daily at 6 (six) AM., Disp: , Rfl:  .  Semaglutide (RYBELSUS) 7 MG TABS, Take by mouth., Disp: , Rfl:  .  tamsulosin (FLOMAX) 0.4 MG CAPS capsule, Take 1 capsule (0.4 mg total) by mouth  daily after supper., Disp: 14 capsule, Rfl: 0 .  tiZANidine (ZANAFLEX) 4  MG tablet, Take 1 tablet (4 mg total) by mouth every 8 (eight) hours as needed for muscle spasms., Disp: 90 tablet, Rfl: 5 .  vitamin B-12 (CYANOCOBALAMIN) 1000 MCG tablet, Take 1,000 mcg by mouth 3 (three) times a week., Disp: , Rfl:  .  oxyCODONE-acetaminophen (PERCOCET) 5-325 MG tablet, Take 1 tablet by mouth every 4 (four) hours as needed for severe pain. (Patient not taking: Reported on 12/23/2019), Disp: 12 tablet, Rfl: 0  Allergies  Allergen Reactions  . Lisinopril Swelling  . Ace Inhibitors Swelling    I personally reviewed active problem list, medication list, allergies, notes from last encounter, lab results with the patient/caregiver today.   ROS  Ten systems reviewed and is negative except as mentioned in HPI  Objective  Virtual encounter, vitals not obtained.  There is no height or weight on file to calculate BMI.  Physical Exam  Pulmonary/Chest: Effort normal. No respiratory distress. Speaking in complete sentences Neurological: Pt is alert and oriented to person, place, and time. Speech is normal Psychiatric: Patient has a normal mood and affect. behavior is normal. Judgment and thought content normal.   No results found for this or any previous visit (from the past 72 hour(s)).  PHQ2/9: Depression screen [facility] 2/9 12/23/2019 03/28/2019 12/06/2018 11/13/2018 10/30/2018  Decreased Interest 0 0 0 2 0  Down, Depressed, Hopeless 0 0 0 0 0  PHQ - 2 Score 0 0 0 2 0  Altered sleeping 0 0 1 3 0  Tired, decreased energy 1 0 3 3 0  Change in appetite 1 0 0 0 0  Feeling bad or failure about yourself  0 0 0 0 0  Trouble concentrating 0 0 0 0 0  Moving slowly or fidgety/restless 0 0 0 0 0  Suicidal thoughts 0 0 0 0 0  PHQ-9 Score 2 0 4 8 0  Difficult doing work/chores Not difficult at all Not difficult at all Not difficult at all Somewhat difficult Not difficult at all  Some recent data might be  hidden   PHQ-2/9 Result is negative.    Fall Risk: Fall Risk  12/23/2019 03/28/2019 12/06/2018 11/13/2018 11/13/2018  Falls in the past year? 0 0 0 0 0  Comment - - - - -  Number falls in past yr: 0 0 - - -  Injury with Fall? 0 0 - - -  Follow up Falls evaluation completed - - - -    Assessment & Plan  1. Nausea - Ambulatory referral to Gastroenterology  2. Belching symptom - Advised cannot test for H pylori as he has been taking PPI therapy on his own.  We will refer to his preferred GI provider today for evaluation.  I did advise these are common SE's of rybelsus and he should reach out to his Endocrinologist to see if she is comfortable either decreasing his dose or pulling him off of the medication temporarily to see if this improves his symptoms.  He will reach out today to endocrinology. - Ambulatory referral to Gastroenterology   I discussed the assessment and treatment plan with the patient. The patient was provided an opportunity to ask questions and all were answered. The patient agreed with the plan and demonstrated an understanding of the instructions.   The patient was advised to call back or seek an in-person evaluation if the symptoms worsen or if the condition fails to improve as anticipated.  I provided 17 minutes of non-face-to-face time during this encounter.  [name],  FNP 

## 2019-12-24 ENCOUNTER — Ambulatory Visit: Payer: 59 | Admitting: Urology

## 2019-12-24 ENCOUNTER — Telehealth (INDEPENDENT_AMBULATORY_CARE_PROVIDER_SITE_OTHER): Payer: Managed Care, Other (non HMO) | Admitting: Urology

## 2019-12-24 DIAGNOSIS — N200 Calculus of kidney: Secondary | ICD-10-CM

## 2019-12-24 DIAGNOSIS — R310 Gross hematuria: Secondary | ICD-10-CM

## 2019-12-24 NOTE — Progress Notes (Signed)
Virtual Visit via Telephone Note  I connected with John Hartman on 12/24/19 at  1:20 PM EST by telephone and verified that I am speaking with the correct person using two identifiers.   I discussed the limitations, risks, security and privacy concerns of performing an evaluation and management service by telephone and the availability of in person appointments. We discussed the impact of the COVID-19 on the healthcare system, and the importance of social distancing and reducing patient and provider exposure. I also discussed with the patient that there may be a patient responsible charge related to this service. The patient expressed understanding and agreed to proceed.  Reason for visit: Follow up bladder biopsy/ureteroscopy  History of Present Illness: I had phone follow-up with John Hartman today.  Briefly, he is a 48 year old male with a cardiac history who presented with gross hematuria and on clinic cystoscopy was found to have a small 1 cm erythematous bladder lesion.  He also had an 8 mm left lower pole stone that he wanted managed at the same time.  He underwent biopsy and fulguration of the small bladder lesion on 11/22/2019, as well as normal bilateral retrograde pyelograms, and left ureteroscopy and laser lithotripsy of his 8 mm left lower pole stone.  His bladder pathology was benign and showed only vascular congestion with cystitis and inflammation.  He did not tolerate his ureteral stent well and had multiple ER visits with significant pain, and his stent was removed in clinic on 11/27/2019 by Dr. Junious Silk.  He had some mild hematuria intermittently for a few weeks after surgery, but has since resolved over the last 2 weeks.  He denies any flank pain or abdominal pain.  Follow-up renal ultrasound on 12/13/2019 shows no hydronephrosis and some small stone fragments in the lower pole.  Follow Up: RTC 1 year with [facility] prior, sooner if recurrent gross hematuria   I discussed the assessment  and treatment plan with the patient. The patient was provided an opportunity to ask questions and all were answered. The patient agreed with the plan and demonstrated an understanding of the instructions.   The patient was advised to call back or seek an in-person evaluation if the symptoms worsen or if the condition fails to improve as anticipated.  I provided 13 minutes of non-face-to-face time during this encounter.  Billey Co, MD

## 2019-12-29 ENCOUNTER — Other Ambulatory Visit: Payer: Self-pay | Admitting: Student in an Organized Health Care Education/Training Program

## 2019-12-31 ENCOUNTER — Ambulatory Visit: Payer: 59 | Admitting: Family Medicine

## 2020-01-06 ENCOUNTER — Encounter: Payer: Self-pay | Admitting: Family Medicine

## 2020-01-06 ENCOUNTER — Ambulatory Visit (INDEPENDENT_AMBULATORY_CARE_PROVIDER_SITE_OTHER): Payer: No Typology Code available for payment source | Admitting: Family Medicine

## 2020-01-06 ENCOUNTER — Other Ambulatory Visit: Payer: Self-pay

## 2020-01-06 VITALS — BP 118/82 | HR 87 | Temp 96.9°F | Resp 12 | Ht 71.0 in | Wt 198.5 lb

## 2020-01-06 DIAGNOSIS — N401 Enlarged prostate with lower urinary tract symptoms: Secondary | ICD-10-CM

## 2020-01-06 DIAGNOSIS — E785 Hyperlipidemia, unspecified: Secondary | ICD-10-CM

## 2020-01-06 DIAGNOSIS — E1165 Type 2 diabetes mellitus with hyperglycemia: Secondary | ICD-10-CM | POA: Diagnosis not present

## 2020-01-06 DIAGNOSIS — I214 Non-ST elevation (NSTEMI) myocardial infarction: Secondary | ICD-10-CM

## 2020-01-06 DIAGNOSIS — E119 Type 2 diabetes mellitus without complications: Secondary | ICD-10-CM

## 2020-01-06 DIAGNOSIS — M1A9XX0 Chronic gout, unspecified, without tophus (tophi): Secondary | ICD-10-CM | POA: Diagnosis not present

## 2020-01-06 DIAGNOSIS — Z0000 Encounter for general adult medical examination without abnormal findings: Secondary | ICD-10-CM

## 2020-01-06 DIAGNOSIS — M4696 Unspecified inflammatory spondylopathy, lumbar region: Secondary | ICD-10-CM | POA: Insufficient documentation

## 2020-01-06 DIAGNOSIS — Z794 Long term (current) use of insulin: Secondary | ICD-10-CM

## 2020-01-06 DIAGNOSIS — I700 Atherosclerosis of aorta: Secondary | ICD-10-CM

## 2020-01-06 DIAGNOSIS — Z5181 Encounter for therapeutic drug level monitoring: Secondary | ICD-10-CM

## 2020-01-06 DIAGNOSIS — I10 Essential (primary) hypertension: Secondary | ICD-10-CM

## 2020-01-06 DIAGNOSIS — R319 Hematuria, unspecified: Secondary | ICD-10-CM

## 2020-01-06 MED ORDER — PREGABALIN 50 MG PO CAPS: 50.0000 mg | ORAL_CAPSULE | Freq: Three times a day (TID) | ORAL | 2 refills | Status: DC

## 2020-01-06 NOTE — Patient Instructions (Signed)
Preventive Care 47-47 Years Old, Male Preventive care refers to lifestyle choices and visits with your health care provider that can promote health and wellness. This includes:  A yearly physical exam. This is also called an annual well check.  Regular dental and eye exams.  Immunizations.  Screening for certain conditions.  Healthy lifestyle choices, such as eating a healthy diet, getting regular exercise, not using drugs or products that contain nicotine and tobacco, and limiting alcohol use. What can I expect for my preventive care visit? Physical exam Your health care provider will check:  Height and weight. These may be used to calculate body mass index (BMI), which is a measurement that tells if you are at a healthy weight.  Heart rate and blood pressure.  Your skin for abnormal spots. Counseling Your health care provider may ask you questions about:  Alcohol, tobacco, and drug use.  Emotional well-being.  Home and relationship well-being.  Sexual activity.  Eating habits.  Work and work Statistician. What immunizations do I need?  Influenza (flu) vaccine  This is recommended every year. Tetanus, diphtheria, and pertussis (Tdap) vaccine  You may need a Td booster every 10 years. Varicella (chickenpox) vaccine  You may need this vaccine if you have not already been vaccinated. Zoster (shingles) vaccine  You may need this after age 47. Measles, mumps, and rubella (MMR) vaccine  You may need at least one dose of MMR if you were born in 1957 or later. You may also need a second dose. Pneumococcal conjugate (PCV13) vaccine  You may need this if you have certain conditions and were not previously vaccinated. Pneumococcal polysaccharide (PPSV23) vaccine  You may need one or two doses if you smoke cigarettes or if you have certain conditions. Meningococcal conjugate (MenACWY) vaccine  You may need this if you have certain conditions. Hepatitis A  vaccine  You may need this if you have certain conditions or if you travel or work in places where you may be exposed to hepatitis A. Hepatitis B vaccine  You may need this if you have certain conditions or if you travel or work in places where you may be exposed to hepatitis B. Haemophilus influenzae type b (Hib) vaccine  You may need this if you have certain risk factors. Human papillomavirus (HPV) vaccine  If recommended by your health care provider, you may need three doses over 6 months. You may receive vaccines as individual doses or as more than one vaccine together in one shot (combination vaccines). Talk with your health care provider about the risks and benefits of combination vaccines. What tests do I need? Blood tests  Lipid and cholesterol levels. These may be checked every 5 years, or more frequently if you are over 60 years old.  Hepatitis C test.  Hepatitis B test. Screening  Lung cancer screening. You may have this screening every year starting at age 47 if you have a 30-pack-year history of smoking and currently smoke or have quit within the past 15 years.  Prostate cancer screening. Recommendations will vary depending on your family history and other risks.  Colorectal cancer screening. All adults should have this screening starting at age 47 and continuing until age 47. Your health care provider may recommend screening at age 47 if you are at increased risk. You will have tests every 1-10 years, depending on your results and the type of screening test.  Diabetes screening. This is done by checking your blood sugar (glucose) after you have not eaten  for a while (fasting). You may have this done every 1-3 years.  Sexually transmitted disease (STD) testing. Follow these instructions at home: Eating and drinking  Eat a diet that includes fresh fruits and vegetables, whole grains, lean protein, and low-fat dairy products.  Take vitamin and mineral supplements as  recommended by your health care provider.  Do not drink alcohol if your health care provider tells you not to drink.  If you drink alcohol: ? Limit how much you have to 0-2 drinks a day. ? Be aware of how much alcohol is in your drink. In the [place] one drink equals one 12 oz bottle of beer (355 mL), one 5 oz glass of wine (148 mL), or one 1 oz glass of hard liquor (44 mL). Lifestyle  Take daily care of your teeth and gums.  Stay active. Exercise for at least 30 minutes on 5 or more days each week.  Do not use any products that contain nicotine or tobacco, such as cigarettes, e-cigarettes, and chewing tobacco. If you need help quitting, ask your health care provider.  If you are sexually active, practice safe sex. Use a condom or other form of protection to prevent STIs (sexually transmitted infections).  Talk with your health care provider about taking a low-dose aspirin every day starting at age 47. What's next?  Go to your health care provider once a year for a well check visit.  Ask your health care provider how often you should have your eyes and teeth checked.  Stay up to date on all vaccines. This information is not intended to replace advice given to you by your health care provider. Make sure you discuss any questions you have with your health care provider. Document Revised: 12/06/2018 Document Reviewed: 12/06/2018 Elsevier Patient Education  2020 [employer].

## 2020-01-06 NOTE — Progress Notes (Signed)
Patient: John Hartman, Male    DOB: [birth year], 49 y.o.   MRN: [id number] [name], FNP Visit Date: 01/06/2020  Today's Provider: Delsa Grana, PA-C   Chief Complaint  Patient presents with  . Annual Exam    split visit  . Follow-up  . Hypertension  . Hyperlipidemia  . Diabetes    see's endo   Subjective:   Annual physical exam:  John Hartman is a 49 y.o. male who presents today for health maintenance and annual & complete physical exam.  He feels well.  He reports exercising - a few times a week, some limitations with chronic back pain Diet generally described by pt as excellent - very healthy, was vegan  He reports he is sleeping fairly well.  Pt wished to discuss chronic f/up  Neck and back problems - managing with specialist, had cervical fusion- [facility]? Also sees Pain management, needs refill on lyrica Right shoulder rotator cuff sugery still doing PT  About 16 months ago MI, and new onset DM severe does see cardiology and endocrinology  Duodenitis a few months ago, GI sx have improved, has some sx a few weeks ago, went to UC and did virtual visit here, found to be a med se from ryblesus dose increase, since dose decreased back to 3 mg daily he feels better and GI sx resolved  Recent had kidney stone and abnormal finding in bladder - hx of some mild BPH, sx have gradually improved, he did have some hematuria yesterday w/o urinary freq, urgency, dysuria, flank pain abd pain  Gout:   Well controlled, Allopurinol 100 mg once daily, no recent flairs   USPSTF grade A and B recommendations - reviewed and addressed today  Depression:  Phq 9 completed today by patient, was reviewed by me with patient in the room, score is  negative, pt feels good PHQ 2/9 Scores 01/06/2020 12/23/2019 03/28/2019 12/06/2018  PHQ - 2 Score 0 0 0 0  PHQ- 9 Score 2 2 0 4  Exception Documentation - - - -   Depression screen [facility] 2/9 01/06/2020 12/23/2019 03/28/2019  12/06/2018 11/13/2018  Decreased Interest 0 0 0 0 2  Down, Depressed, Hopeless 0 0 0 0 0  PHQ - 2 Score 0 0 0 0 2  Altered sleeping 0 0 0 1 3  Tired, decreased energy 1 1 0 3 3  Change in appetite 1 1 0 0 0  Feeling bad or failure about yourself  0 0 0 0 0  Trouble concentrating 0 0 0 0 0  Moving slowly or fidgety/restless 0 0 0 0 0  Suicidal thoughts 0 0 0 0 0  PHQ-9 Score 2 2 0 4 8  Difficult doing work/chores Not difficult at all Not difficult at all Not difficult at all Not difficult at all Somewhat difficult  Some recent data might be hidden    Hep C Screening: not indicated  STD testing and prevention (HIV/chl/gon/syphilis): none previuosly checked not risk factors Prostate cancer: done in the past with urology?   Lab Results  Component Value Date   PSA 0.4 03/28/2019   Intimate partner violence:none, denies  Urinary Symptoms:   IPSS Questionnaire (AUA-7): Over the past month.   1)  How often have you had a sensation of not emptying your bladder completely after you finish urinating?  0 - Not at all  2)  How often have you had to urinate again less than two hours after you finished  urinating? 0 - Not at all  3)  How often have you found you stopped and started again several times when you urinated?  1 - Less than 1 time in 5  4) How difficult have you found it to postpone urination?  0 - Not at all  5) How often have you had a weak urinary stream?  0 - Not at all  6) How often have you had to push or strain to begin urination?  0 - Not at all  7) How many times did you most typically get up to urinate from the time you went to bed until the time you got up in the morning?  0 - None  Total score:  0-7 mildly symptomatic   8-19 moderately symptomatic   20-35 severely symptomatic    Advanced Care Planning:  A voluntary discussion about advance care planning including the explanation and discussion of advance directives.  Discussed health care proxy and Living will, and the  patient was able to identify a health care proxy as wife teresa Wentworth.  Patient does have a living will at present time. If patient does have living will, I have requested they bring this to the clinic to be scanned in to their chart.   Skin cancer:  One year ago last skin survey was.  Pt reports no hx of skin cancer, suspicious lesions/biopsies in the past.  Colorectal cancer:  colonoscopy is due this year    Lung cancer:   Low Dose CT Chest recommended if Age 45-80 years, 30 pack-year currently smoking OR have quit w/in 15years. Patient does not qualify.   Social History   Tobacco Use  . Smoking status: Former Smoker    Packs/day: 1.00    Years: 10.00    Pack years: 10.00    Types: Cigarettes    Quit date: 04/15/2015    Years since quitting: 4.7  . Smokeless tobacco: Never Used  Substance Use Topics  . Alcohol use: No    Alcohol/week: 0.0 standard drinks     Alcohol screening:   [facility]  AUDIT-C Score  0      AAA:  The USPSTF recommends one-time screening with ultrasonography in men ages 79 to 71 years who have ever smoked  ECG:  None today - sees Cardiology  Blood pressure/Hypertension: BP Readings from Last 3 Encounters:  01/06/20 118/82  12/15/19 (!) 128/100  11/27/19 (!) 172/121   Weight/Obesity: Wt Readings from Last 3 Encounters:  01/06/20 198 lb 8 oz (90 kg)  12/15/19 193 lb (87.5 kg)  11/26/19 207 lb (93.9 kg)   BMI Readings from Last 3 Encounters:  01/06/20 27.69 kg/m  12/15/19 26.92 kg/m  11/26/19 28.87 kg/m    Lipids:  Lab Results  Component Value Date   CHOL 103 03/28/2019   CHOL 110 02/14/2019   CHOL 132 11/13/2018   Lab Results  Component Value Date   HDL 25 (L) 03/28/2019   HDL 32 (L) 02/14/2019   HDL 38 (L) 11/13/2018   Lab Results  Component Value Date   LDLCALC 58 03/28/2019   LDLCALC 59 02/14/2019   LDLCALC 78 11/13/2018   Lab Results  Component Value Date   TRIG 116  03/28/2019   TRIG 108 02/14/2019   TRIG 84 11/13/2018   Lab Results  Component Value Date   CHOLHDL 4.1 03/28/2019   CHOLHDL 3.4 02/14/2019   CHOLHDL 3.5 11/13/2018   No results found for:  LDLDIRECT Based on the results of lipid panel his/her cardiovascular risk factor ( using [facility] )  in the next 10 years is : The ASCVD Risk score Mikey Bussing DC Jr., et al., 2013) failed to calculate for the following reasons:   The patient has a prior MI or stroke diagnosis Glucose:  Glucose, Bld  Date Value Ref Range Status  12/15/2019 115 (H) 70 - 99 mg/dL Final  11/26/2019 153 (H) 70 - 99 mg/dL Final  11/24/2019 135 (H) 70 - 99 mg/dL Final   Glucose-Capillary  Date Value Ref Range Status  11/22/2019 138 (H) 70 - 99 mg/dL Final  11/22/2019 117 (H) 70 - 99 mg/dL Final  03/25/2019 128 (H) 70 - 99 mg/dL Final    Social History      He  reports that he quit smoking about 4 years ago. His smoking use included cigarettes. He has a 10.00 pack-year smoking history. He has never used smokeless tobacco. He reports that he does not drink alcohol or use drugs.       Social History   Socioeconomic History  . Marital status: Married    Spouse name: Not on file  . Number of children: Not on file  . Years of education: Not on file  . Highest education level: Not on file  Occupational History  . Not on file  Tobacco Use  . Smoking status: Former Smoker    Packs/day: 1.00    Years: 10.00    Pack years: 10.00    Types: Cigarettes    Quit date: 04/15/2015    Years since quitting: 4.7  . Smokeless tobacco: Never Used  Substance and Sexual Activity  . Alcohol use: No    Alcohol/week: 0.0 standard drinks  . Drug use: No  . Sexual activity: Yes  Other Topics Concern  . Not on file  Social History Narrative  . Not on file   Social Determinants of Health   Financial Resource Strain:   . Difficulty of Paying Living Expenses: Not on file  Food Insecurity:   . Worried About Charity fundraiser  in the Last Year: Not on file  . Ran Out of Food in the Last Year: Not on file  Transportation Needs:   . Lack of Transportation (Medical): Not on file  . Lack of Transportation (Non-Medical): Not on file  Physical Activity:   . Days of Exercise per Week: Not on file  . Minutes of Exercise per Session: Not on file  Stress:   . Feeling of Stress : Not on file  Social Connections:   . Frequency of Communication with Friends and Family: Not on file  . Frequency of Social Gatherings with Friends and Family: Not on file  . Attends Religious Services: Not on file  . Active Member of Clubs or Organizations: Not on file  . Attends Archivist Meetings: Not on file  . Marital Status: Not on file         Social History   Socioeconomic History  . Marital status: Married    Spouse name: Not on file  . Number of children: Not on file  . Years of education: Not on file  . Highest education level: Not on file  Occupational History  . Not on file  Tobacco Use  . Smoking status: Former Smoker    Packs/day: 1.00    Years: 10.00    Pack years: 10.00    Types: Cigarettes    Quit date: 04/15/2015  Years since quitting: 4.7  . Smokeless tobacco: Never Used  Substance and Sexual Activity  . Alcohol use: No    Alcohol/week: 0.0 standard drinks  . Drug use: No  . Sexual activity: Yes  Other Topics Concern  . Not on file  Social History Narrative  . Not on file   Social Determinants of Health   Financial Resource Strain:   . Difficulty of Paying Living Expenses: Not on file  Food Insecurity:   . Worried About Charity fundraiser in the Last Year: Not on file  . Ran Out of Food in the Last Year: Not on file  Transportation Needs:   . Lack of Transportation (Medical): Not on file  . Lack of Transportation (Non-Medical): Not on file  Physical Activity:   . Days of Exercise per Week: Not on file  . Minutes of Exercise per Session: Not on file  Stress:   . Feeling of Stress :  Not on file  Social Connections:   . Frequency of Communication with Friends and Family: Not on file  . Frequency of Social Gatherings with Friends and Family: Not on file  . Attends Religious Services: Not on file  . Active Member of Clubs or Organizations: Not on file  . Attends Archivist Meetings: Not on file  . Marital Status: Not on file  Intimate Partner Violence:   . Fear of Current or Ex-Partner: Not on file  . Emotionally Abused: Not on file  . Physically Abused: Not on file  . Sexually Abused: Not on file    Family History        Family Status  Relation Name Status  . Mother  Alive  . Father  Alive  . MGF  Deceased       cancer  . Sister 1/2 Alive  . Brother 1 Alive  . Son  Alive  . MGM  Deceased       alhzhmiers  . PGM father is adopted Other  . PGF father is adopted Other  . Neg Hx  (Not Specified)        His family history includes Allergies in his son; Alzheimer's disease in his maternal grandmother; Arrhythmia in his mother; Breast cancer in his mother; Cancer in his maternal grandfather and mother; Diabetes in his father; Heart disease in his brother; Hypertension in his father; Skin cancer in his mother. There is no history of Stroke or COPD.       Family History  Problem Relation Age of Onset  . Breast cancer Mother   . Skin cancer Mother   . Arrhythmia Mother        A-fib  . Cancer Mother        breast, colon?, skin  . Hypertension Father   . Diabetes Father   . Cancer Maternal Grandfather   . Heart disease Brother        stent  . Allergies Son   . Alzheimer's disease Maternal Grandmother   . Stroke Neg Hx   . COPD Neg Hx     Patient Active Problem List   Diagnosis Date Noted  . Arthritis of right acromioclavicular joint 09/23/2019  . Biceps tendonitis, unspecified laterality 09/23/2019  . Bursitis of right shoulder 09/23/2019  . Incomplete tear of right rotator cuff 09/23/2019  . 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 Tachycardia  03/24/2019  . Status post myocardial infarction 11/24/2018  . LVH (left ventricular hypertrophy) 11/24/2018  . 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  History of angioedema 09/01/2018  . NSTEMI (non-ST elevated myocardial infarction) ([place]) 08/22/2018  . Elevated hemoglobin ([place]) 08/11/2017  . Chronic lower back pain 08/04/2017  . Degenerative disc disease, lumbar 08/04/2017  . 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 Calcification of abdominal aorta (HCC) 08/04/2017  . Fatty liver 03/31/2017  . Elevated liver enzymes 02/11/2017  . Serum total bilirubin elevated 02/11/2017  . Hyperlipidemia LDL goal <70 01/04/2017  . Elevated serum glutamic pyruvic transaminase (SGPT) level 01/04/2017  . Angioedema   . Encounter for medication monitoring 08/25/2016  . Coronary artery disease 08/25/2016  . Uncontrolled diabetes mellitus with hyperglycemia ([place])   . Chest pain 08/08/2016  . Preventative health care 08/02/2016  . Ketonuria 07/12/2016  . Glucosuria 07/12/2016  . 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 Palpitations 08/12/2015  . Family history of malignant neoplasm of gastrointestinal tract   . Benign neoplasm of rectosigmoid junction   . Essential hypertension 05/28/2015  . Gout 05/28/2015  . IBS (irritable bowel syndrome) 05/28/2015    Past Surgical History:  Procedure Laterality Date  . ACDF with fusion  2007  . CARDIAC CATHETERIZATION Left 08/15/2016   Procedure: Left Heart Cath and Coronary Angiography;  Surgeon: Wellington Hampshire, MD;  Location: [place] [facility];  Service: Cardiovascular;  Laterality: Left;  . COLONOSCOPY WITH PROPOFOL N/A 06/26/2015   Procedure: COLONOSCOPY WITH PROPOFOL;  Surgeon: Lucilla Lame, MD;  Location: [place];  Service: Endoscopy;  Laterality: N/A;  with biopsies  . CORONARY/GRAFT ACUTE MI REVASCULARIZATION N/A 08/23/2018   Procedure: Coronary/Graft Acute MI Revascularization;  Surgeon: Isaias Cowman, MD;  Location: [place] [facility];   Service: Cardiovascular;  Laterality: N/A;  . CYSTOSCOPY WITH BIOPSY N/A 11/22/2019   Procedure: CYSTOSCOPY WITH Bladder BIOPSY & Almyra Free;  Surgeon: Billey Co, MD;  Location: [facility];  Service: Urology;  Laterality: N/A;  . CYSTOSCOPY/URETEROSCOPY/HOLMIUM LASER/STENT PLACEMENT Left 11/22/2019   Procedure: CYSTOSCOPY/URETEROSCOPY/BILATERAL RETROGRADE PYELOGRAM/HOLMIUM LASER/STENT PLACEMENT;  Surgeon: Billey Co, MD;  Location: [facility];  Service: Urology;  Laterality: Left;  . LEFT HEART CATH AND CORONARY ANGIOGRAPHY N/A 08/23/2018   Procedure: LEFT HEART CATH AND CORONARY ANGIOGRAPHY;  Surgeon: Isaias Cowman, MD;  Location: [place] [facility];  Service: Cardiovascular;  Laterality: N/A;  . NASAL SEPTUM SURGERY  2004  . SHOULDER ARTHROSCOPY W/ ROTATOR CUFF REPAIR  10/01/2019  . SHOULDER SURGERY Left 2007  . Testicular torsion  1980s  . VASECTOMY       Current Outpatient Medications:  .  ACCU-CHEK SOFTCLIX LANCETS lancets, USE AS DIRECTED, Disp: 100 each, Rfl: 0 .  allopurinol (ZYLOPRIM) 100 MG tablet, Take 100 mg by mouth daily. , Disp: , Rfl:  .  aspirin EC 81 MG tablet, Take 81 mg by mouth daily., Disp: , Rfl:  .  atorvastatin (LIPITOR) 80 MG tablet, Take 1 tablet (80 mg total) by mouth daily at 6 PM. (Patient taking differently: Take 80 mg by mouth daily with supper. ), Disp: 30 tablet, Rfl: 0 .  BELBUCA 450 MCG FILM, Place 1 Film inside cheek 2 (two) times daily., Disp: 60 each, Rfl: 1 .  cetirizine (ZYRTEC) 10 MG tablet, Take 10 mg by mouth at bedtime., Disp: , Rfl:  .  Dapagliflozin-metFORMIN HCl ER (XIGDUO XR) 04-999 MG TB24, Take 2 tablets by mouth daily. , Disp: , Rfl:  .  diphenhydrAMINE (BENADRYL) 25 MG tablet, Take 50 mg by mouth at bedtime., Disp: , Rfl:  .  hydrALAZINE (APRESOLINE) 25 MG tablet, Take 1 tablet (25  mg total) by mouth 2 (two) times daily., Disp: 60 tablet, Rfl: 0 .  ketorolac (TORADOL) 10 MG tablet, Take 1 tablet (10 mg total) by mouth  every 6 (six) hours as needed., Disp: 20 tablet, Rfl: 0 .  Melatonin 5 MG TABS, Take 10 mg by mouth at bedtime. , Disp: , Rfl:  .  Menthol, Topical Analgesic, (BIOFREEZE EX), Apply 1 application topically 3 (three) times daily as needed (muscle cramps/shoulder pain.)., Disp: , Rfl:  .  metoprolol tartrate (LOPRESSOR) 25 MG tablet, Take 25 mg by mouth 2 (two) times daily., Disp: , Rfl:  .  nitroGLYCERIN (NITROSTAT) 0.4 MG SL tablet, Place 1 tablet (0.4 mg total) under the tongue every 5 (five) minutes as needed for chest pain., Disp: 30 tablet, Rfl: 0 .  ONETOUCH VERIO test strip, USE 2 (TWO) TIMES DAILY USE AS INSTRUCTED., Disp: , Rfl:  .  pregabalin (LYRICA) 50 MG capsule, Take 1 capsule (50 mg total) by mouth 3 (three) times daily., Disp: 90 capsule, Rfl: 2 .  RYBELSUS 3 MG TABS, Take 7 mg by mouth daily at 6 (six) AM., Disp: , Rfl:  .  tiZANidine (ZANAFLEX) 4 MG tablet, Take 1 tablet (4 mg total) by mouth every 8 (eight) hours as needed for muscle spasms., Disp: 90 tablet, Rfl: 5 .  vitamin B-12 (CYANOCOBALAMIN) 1000 MCG tablet, Take 1,000 mcg by mouth 3 (three) times a week., Disp: , Rfl:  .  ezetimibe (ZETIA) 10 MG tablet, Take 1 tablet (10 mg total) by mouth daily. (Patient not taking: Reported on 01/06/2020), Disp: 30 tablet, Rfl: 11  Allergies  Allergen Reactions  . Lisinopril Swelling  . Ace Inhibitors Swelling    Patient Care Team: [name], FNP as PCP - General (Family Medicine) Isaias Cowman, MD as Consulting Physician (Cardiology) Solum, Betsey Holiday, MD as Physician Assistant (Endocrinology) Gillis Santa, MD as Consulting Physician (Pain Medicine) Lucilla Lame, MD as Consulting Physician (Gastroenterology) Caryl Comes, MD as Consulting Physician (Orthopedic Surgery)  I personally reviewed active problem list, medication list, allergies, family history, social history, health maintenance, notes from last encounter, lab results, imaging with the patient/caregiver  today.  Review of Systems  Constitutional: Negative.  Negative for activity change, appetite change, fatigue and unexpected weight change.  HENT: Negative.   Eyes: Negative.   Respiratory: Negative.  Negative for shortness of breath.   Cardiovascular: Negative.  Negative for chest pain, palpitations and leg swelling.  Gastrointestinal: Negative.  Negative for abdominal pain and blood in stool.  Endocrine: Negative.   Genitourinary: Negative.  Negative for decreased urine volume, difficulty urinating, testicular pain and urgency.  Skin: Negative.  Negative for color change and pallor.  Allergic/Immunologic: Negative.   Neurological: Negative.  Negative for syncope, weakness, light-headedness and numbness.  Psychiatric/Behavioral: Negative.  Negative for confusion, dysphoric mood, self-injury and suicidal ideas. The patient is not nervous/anxious.   All other systems reviewed and are negative.         Objective:   Vitals:  Vitals:   01/06/20 1002  BP: 118/82  Pulse: 87  Resp: 12  Temp: (!) 96.9 F (36.1 C)  TempSrc: Temporal  SpO2: 98%  Weight: 198 lb 8 oz (90 kg)  Height: 5\' 11"  (1.803 m)    Body mass index is 27.69 kg/m.  Physical Exam Vitals and nursing note reviewed.  Constitutional:      General: He is not in acute distress.    Appearance: Normal appearance. He is well-developed. He is not ill-appearing,  toxic-appearing or diaphoretic.     Interventions: Face mask in place.  HENT:     Head: Normocephalic and atraumatic.     Jaw: No trismus.     Right Ear: External ear normal.     Left Ear: External ear normal.  Eyes:     General: Lids are normal. No scleral icterus.    Conjunctiva/sclera: Conjunctivae normal.     Pupils: Pupils are equal, round, and reactive to light.  Neck:     Trachea: Trachea and phonation normal. No tracheal deviation.  Cardiovascular:     Rate and Rhythm: Normal rate and regular rhythm.     Pulses: Normal pulses.          Radial  pulses are 2+ on the right side and 2+ on the left side.       Posterior tibial pulses are 2+ on the right side and 2+ on the left side.     Heart sounds: Normal heart sounds. No murmur. No friction rub. No gallop.   Pulmonary:     Effort: Pulmonary effort is normal. No respiratory distress.     Breath sounds: Normal breath sounds. No stridor. No wheezing, rhonchi or rales.  Abdominal:     General: Bowel sounds are normal. There is no distension.     Palpations: Abdomen is soft.     Tenderness: There is no abdominal tenderness. There is no guarding or rebound.  Musculoskeletal:        General: Normal range of motion.     Cervical back: Normal range of motion and neck supple.     Right lower leg: No edema.     Left lower leg: No edema.  Skin:    General: Skin is warm and dry.     Capillary Refill: Capillary refill takes less than 2 seconds.     Coloration: Skin is not jaundiced.     Findings: No rash.     Nails: There is no clubbing.  Neurological:     Mental Status: He is alert.     Cranial Nerves: No dysarthria or facial asymmetry.     Motor: No tremor or abnormal muscle tone.     Gait: Gait normal.  Psychiatric:        Mood and Affect: Mood normal.        Speech: Speech normal.        Behavior: Behavior normal. Behavior is cooperative.         PHQ2/9: Depression screen [facility] 2/9 01/06/2020 12/23/2019 03/28/2019 12/06/2018 11/13/2018  Decreased Interest 0 0 0 0 2  Down, Depressed, Hopeless 0 0 0 0 0  PHQ - 2 Score 0 0 0 0 2  Altered sleeping 0 0 0 1 3  Tired, decreased energy 1 1 0 3 3  Change in appetite 1 1 0 0 0  Feeling bad or failure about yourself  0 0 0 0 0  Trouble concentrating 0 0 0 0 0  Moving slowly or fidgety/restless 0 0 0 0 0  Suicidal thoughts 0 0 0 0 0  PHQ-9 Score 2 2 0 4 8  Difficult doing work/chores Not difficult at all Not difficult at all Not difficult at all Not difficult at all Somewhat difficult  Some recent data might be hidden    Fall  Risk: Fall Risk  01/06/2020 12/23/2019 03/28/2019 12/06/2018 11/13/2018  Falls in the past year? 0 0 0 0 0  Comment - - - - -  Number falls in past  yr: 0 0 0 - -  Injury with Fall? 0 0 0 - -  Follow up - Falls evaluation completed - - -    Functional Status Survey: Does the patient have difficulty seeing, even when wearing glasses/contacts?: No Does the patient have difficulty concentrating, remembering, or making decisions?: No Does the patient have difficulty walking or climbing stairs?: No Does the patient have difficulty dressing or bathing?: No Does the patient have difficulty doing errands alone such as visiting a doctor's office or shopping?: No   Assessment & Plan:    CPE completed today  . Prostate cancer screening and PSA options (with potential risks and benefits of testing vs not testing) were discussed along with recent recs/guidelines, shared decision making and handout/information given to pt today  . USPSTF grade A and B recommendations reviewed with patient; age-appropriate recommendations, preventive care, screening tests, etc discussed and encouraged; healthy living encouraged; see AVS for patient education given to patient  . Discussed importance of 150 minutes of physical activity weekly, AHA exercise recommendations given to pt in AVS/handout  . Discussed importance of healthy diet:  eating lean meats and proteins, avoiding trans fats and saturated fats, avoid simple sugars and excessive carbs in diet, eat 6 servings of fruit/vegetables daily and drink plenty of water and avoid sweet beverages.  DASH diet reviewed if pt has HTN  . Recommended pt to do annual eye exam and routine dental exams/cleanings  . Reviewed Health Maintenance: Health Maintenance  Topic Date Due  . INFLUENZA VACCINE  03/25/2020 (Originally 07/27/2019)  . PNEUMOCOCCAL POLYSACCHARIDE VACCINE AGE 59-64 HIGH RISK  01/05/2021 (Originally 03/30/1973)  . OPHTHALMOLOGY EXAM  02/24/2020  . HEMOGLOBIN  A1C  04/17/2020  . COLONOSCOPY  06/25/2020  . FOOT EXAM  10/17/2020  . TETANUS/TDAP  12/26/2020  . HIV Screening  Completed    . Immunizations: Immunization History  Administered Date(s) Administered  . Tdap 12/26/2010        ICD-10-CM   1. Adult general medical exam  Z00.00 CMP w GFR    Lipid Panel    CBC w/ Diff    Uric acid    UA w/reflex microscopy, LAB    Urine Culture  2. Uncontrolled type 2 diabetes mellitus with hyperglycemia ([place])  E11.65    per endo last A1C 09/2019, foot exam UTD, eye exam due march   3. Chronic gout without tophus, unspecified cause, unspecified site  M1A.9XX0 CMP w GFR    Uric acid   well controlled with 100 mg daily allopurinol, recheck uric acid  4. NSTEMI (non-ST elevated myocardial infarction) ([place])  I21.4    per cardiology, no anginal sx currently  5. Essential hypertension  I10    well controlled  6. Hyperlipidemia LDL goal <70  E78.5    compliant with meds, healthy diet  7. Type 2 diabetes mellitus without complication, with long-term current use of insulin (HCC)  E11.9    Z79.4    see above  8. Calcification of abdominal aorta (HCC)  I70.0    monitoring  9. Encounter for medication monitoring  Z51.81 CMP w GFR    Lipid Panel    CBC w/ Diff    Uric acid    UA w/reflex microscopy, LAB    Urine Culture  10. Hematuria, unspecified type  R31.9    onset yesterday, no other sx  11. Benign prostatic hyperplasia with lower urinary tract symptoms, symptom details unspecified  N40.1    mild sx, well controlled, seeing urology  12. Unspecified inflammatory spondylopathy, lumbar region [facility])  M46.96    managed by specialist - pain well controlled with lyrica      Delsa Grana, PA-C 01/06/20 10:23 AM  [place]

## 2020-01-07 LAB — LIPID PANEL
Cholesterol: 135 mg/dL (ref <200)
HDL: 33 mg/dL — ABNORMAL LOW (ref >=40)
LDL Cholesterol (Calc): 78 mg/dL (calc)
Non-HDL Cholesterol (Calc): 102 mg/dL (calc) (ref <130)
Total CHOL/HDL Ratio: 4.1 (calc) (ref <5.0)
Triglycerides: 140 mg/dL (ref <150)

## 2020-01-07 LAB — COMPLETE METABOLIC PANEL WITH GFR
AG Ratio: 1.9 (calc) (ref 1.0–2.5)
ALT: 34 U/L (ref 9–46)
AST: 25 U/L (ref 10–40)
Albumin: 4.8 g/dL (ref 3.6–5.1)
Alkaline phosphatase (APISO): 81 U/L (ref 36–130)
BUN: 13 mg/dL (ref 7–25)
CO2: 28 mmol/L (ref 20–32)
Calcium: 10.2 mg/dL (ref 8.6–10.3)
Chloride: 104 mmol/L (ref 98–110)
Creat: 0.75 mg/dL (ref 0.60–1.35)
GFR, Est African American: 128 mL/min/{1.73_m2} (ref >=60)
GFR, Est Non African American: 110 mL/min/{1.73_m2} (ref >=60)
Globulin: 2.5 g/dL (calc) (ref 1.9–3.7)
Glucose, Bld: 88 mg/dL (ref 65–99)
Potassium: 4.5 mmol/L (ref 3.5–5.3)
Sodium: 141 mmol/L (ref 135–146)
Total Bilirubin: 1.3 mg/dL — ABNORMAL HIGH (ref 0.2–1.2)
Total Protein: 7.3 g/dL (ref 6.1–8.1)

## 2020-01-07 LAB — CBC WITH DIFFERENTIAL/PLATELET
Absolute Monocytes: 667 cells/uL (ref 200–950)
Basophils Absolute: 99 cells/uL (ref 0–200)
Basophils Relative: 1.4 %
Eosinophils Absolute: 1015 cells/uL — ABNORMAL HIGH (ref 15–500)
Eosinophils Relative: 14.3 %
HCT: 50.3 % — ABNORMAL HIGH (ref 38.5–50.0)
Hemoglobin: 16.4 g/dL (ref 13.2–17.1)
Lymphs Abs: 2215 cells/uL (ref 850–3900)
MCH: 26.3 pg — ABNORMAL LOW (ref 27.0–33.0)
MCHC: 32.6 g/dL (ref 32.0–36.0)
MCV: 80.7 fL (ref 80.0–100.0)
MPV: 11 fL (ref 7.5–12.5)
Monocytes Relative: 9.4 %
Neutro Abs: 3103 cells/uL (ref 1500–7800)
Neutrophils Relative %: 43.7 %
Platelets: 218 10*3/uL (ref 140–400)
RBC: 6.23 10*6/uL — ABNORMAL HIGH (ref 4.20–5.80)
RDW: 13.9 % (ref 11.0–15.0)
Total Lymphocyte: 31.2 %
WBC: 7.1 10*3/uL (ref 3.8–10.8)

## 2020-01-07 LAB — URINE CULTURE
MICRO NUMBER:: 10028973
Result:: NO GROWTH
SPECIMEN QUALITY:: ADEQUATE

## 2020-01-07 LAB — URINALYSIS, ROUTINE W REFLEX MICROSCOPIC
Bacteria, UA: NONE SEEN /HPF
Bilirubin Urine: NEGATIVE
Hyaline Cast: NONE SEEN /LPF
Ketones, ur: NEGATIVE
Nitrite: NEGATIVE
Specific Gravity, Urine: 1.039 — ABNORMAL HIGH (ref 1.001–1.03)
Squamous Epithelial / HPF: NONE SEEN /HPF (ref <=5)
pH: <=5 (ref 5.0–8.0)

## 2020-01-07 LAB — URIC ACID: Uric Acid, Serum: 3.6 mg/dL — ABNORMAL LOW (ref 4.0–8.0)

## 2020-01-14 ENCOUNTER — Ambulatory Visit: Payer: 59 | Admitting: Urology

## 2020-02-07 ENCOUNTER — Encounter: Payer: Self-pay | Admitting: Anesthesiology

## 2020-02-07 ENCOUNTER — Emergency Department: Payer: No Typology Code available for payment source

## 2020-02-07 ENCOUNTER — Other Ambulatory Visit: Payer: Self-pay

## 2020-02-07 ENCOUNTER — Encounter: Payer: Self-pay | Admitting: Internal Medicine

## 2020-02-07 ENCOUNTER — Inpatient Hospital Stay
Admission: EM | Admit: 2020-02-07 | Discharge: 2020-02-12 | DRG: 668 | Disposition: A | Discharge status: Home / Self Care | Payer: No Typology Code available for payment source | Attending: Internal Medicine | Admitting: Internal Medicine

## 2020-02-07 DIAGNOSIS — M109 Gout, unspecified: Secondary | ICD-10-CM | POA: Diagnosis present

## 2020-02-07 DIAGNOSIS — R079 Chest pain, unspecified: Secondary | ICD-10-CM

## 2020-02-07 DIAGNOSIS — E119 Type 2 diabetes mellitus without complications: Secondary | ICD-10-CM | POA: Diagnosis not present

## 2020-02-07 DIAGNOSIS — E785 Hyperlipidemia, unspecified: Secondary | ICD-10-CM | POA: Diagnosis present

## 2020-02-07 DIAGNOSIS — I2510 Atherosclerotic heart disease of native coronary artery without angina pectoris: Secondary | ICD-10-CM | POA: Diagnosis present

## 2020-02-07 DIAGNOSIS — I214 Non-ST elevation (NSTEMI) myocardial infarction: Secondary | ICD-10-CM | POA: Diagnosis present

## 2020-02-07 DIAGNOSIS — Z7982 Long term (current) use of aspirin: Secondary | ICD-10-CM | POA: Diagnosis not present

## 2020-02-07 DIAGNOSIS — I10 Essential (primary) hypertension: Secondary | ICD-10-CM | POA: Diagnosis not present

## 2020-02-07 DIAGNOSIS — Z87442 Personal history of urinary calculi: Secondary | ICD-10-CM

## 2020-02-07 DIAGNOSIS — Z87891 Personal history of nicotine dependence: Secondary | ICD-10-CM

## 2020-02-07 DIAGNOSIS — R519 Headache, unspecified: Secondary | ICD-10-CM | POA: Diagnosis not present

## 2020-02-07 DIAGNOSIS — Z833 Family history of diabetes mellitus: Secondary | ICD-10-CM

## 2020-02-07 DIAGNOSIS — F064 Anxiety disorder due to known physiological condition: Secondary | ICD-10-CM | POA: Diagnosis present

## 2020-02-07 DIAGNOSIS — R109 Unspecified abdominal pain: Secondary | ICD-10-CM

## 2020-02-07 DIAGNOSIS — N200 Calculus of kidney: Secondary | ICD-10-CM

## 2020-02-07 DIAGNOSIS — K219 Gastro-esophageal reflux disease without esophagitis: Secondary | ICD-10-CM | POA: Diagnosis present

## 2020-02-07 DIAGNOSIS — N201 Calculus of ureter: Secondary | ICD-10-CM | POA: Diagnosis present

## 2020-02-07 DIAGNOSIS — I110 Hypertensive heart disease with heart failure: Secondary | ICD-10-CM | POA: Diagnosis present

## 2020-02-07 DIAGNOSIS — Z955 Presence of coronary angioplasty implant and graft: Secondary | ICD-10-CM

## 2020-02-07 DIAGNOSIS — Z8249 Family history of ischemic heart disease and other diseases of the circulatory system: Secondary | ICD-10-CM

## 2020-02-07 DIAGNOSIS — K760 Fatty (change of) liver, not elsewhere classified: Secondary | ICD-10-CM | POA: Diagnosis present

## 2020-02-07 DIAGNOSIS — Z981 Arthrodesis status: Secondary | ICD-10-CM | POA: Diagnosis not present

## 2020-02-07 DIAGNOSIS — K589 Irritable bowel syndrome without diarrhea: Secondary | ICD-10-CM | POA: Diagnosis present

## 2020-02-07 DIAGNOSIS — Z7984 Long term (current) use of oral hypoglycemic drugs: Secondary | ICD-10-CM

## 2020-02-07 DIAGNOSIS — N132 Hydronephrosis with renal and ureteral calculous obstruction: Principal | ICD-10-CM | POA: Diagnosis present

## 2020-02-07 DIAGNOSIS — I252 Old myocardial infarction: Secondary | ICD-10-CM | POA: Diagnosis not present

## 2020-02-07 DIAGNOSIS — N179 Acute kidney failure, unspecified: Secondary | ICD-10-CM | POA: Diagnosis present

## 2020-02-07 DIAGNOSIS — I5022 Chronic systolic (congestive) heart failure: Secondary | ICD-10-CM | POA: Diagnosis present

## 2020-02-07 DIAGNOSIS — Z20822 Contact with and (suspected) exposure to covid-19: Secondary | ICD-10-CM | POA: Diagnosis present

## 2020-02-07 DIAGNOSIS — J302 Other seasonal allergic rhinitis: Secondary | ICD-10-CM | POA: Diagnosis present

## 2020-02-07 DIAGNOSIS — E782 Mixed hyperlipidemia: Secondary | ICD-10-CM | POA: Diagnosis present

## 2020-02-07 DIAGNOSIS — E1140 Type 2 diabetes mellitus with diabetic neuropathy, unspecified: Secondary | ICD-10-CM | POA: Diagnosis present

## 2020-02-07 DIAGNOSIS — Z79899 Other long term (current) drug therapy: Secondary | ICD-10-CM

## 2020-02-07 DIAGNOSIS — N23 Unspecified renal colic: Secondary | ICD-10-CM | POA: Diagnosis not present

## 2020-02-07 DIAGNOSIS — Z888 Allergy status to other drugs, medicaments and biological substances status: Secondary | ICD-10-CM

## 2020-02-07 LAB — CBC
HCT: 51 % (ref 39.0–52.0)
Hemoglobin: 16.6 g/dL (ref 13.0–17.0)
MCH: 26.2 pg (ref 26.0–34.0)
MCHC: 32.5 g/dL (ref 30.0–36.0)
MCV: 80.6 fL (ref 80.0–100.0)
Platelets: 221 K/uL (ref 150–400)
RBC: 6.33 MIL/uL — ABNORMAL HIGH (ref 4.22–5.81)
RDW: 14.1 % (ref 11.5–15.5)
WBC: 9.5 K/uL (ref 4.0–10.5)
nRBC: 0 % (ref 0.0–0.2)

## 2020-02-07 LAB — URINALYSIS, COMPLETE (UACMP) WITH MICROSCOPIC
Bacteria, UA: NONE SEEN
Bilirubin Urine: NEGATIVE
Glucose, UA: >=500 mg/dL — AB
Ketones, ur: 20 mg/dL — AB
Leukocytes,Ua: NEGATIVE
Nitrite: NEGATIVE
Protein, ur: 100 mg/dL — AB
RBC / HPF: >50 RBC/hpf — ABNORMAL HIGH (ref 0–5)
Specific Gravity, Urine: 1.026 (ref 1.005–1.030)
Squamous Epithelial / HPF: NONE SEEN (ref 0–5)
pH: 5 (ref 5.0–8.0)

## 2020-02-07 LAB — URINE DRUG SCREEN, QUALITATIVE (ARMC ONLY)
Amphetamines, Ur Screen: NOT DETECTED
Barbiturates, Ur Screen: NOT DETECTED
Benzodiazepine, Ur Scrn: POSITIVE — AB
Cannabinoid 50 Ng, Ur ~~LOC~~: NOT DETECTED
Cocaine Metabolite,Ur ~~LOC~~: NOT DETECTED
MDMA (Ecstasy)Ur Screen: NOT DETECTED
Methadone Scn, Ur: NOT DETECTED
Opiate, Ur Screen: POSITIVE — AB
Phencyclidine (PCP) Ur S: NOT DETECTED
Tricyclic, Ur Screen: NOT DETECTED

## 2020-02-07 LAB — TROPONIN I (HIGH SENSITIVITY)
Troponin I (High Sensitivity): 10 ng/L (ref <18)
Troponin I (High Sensitivity): 1460 ng/L (ref <18)
Troponin I (High Sensitivity): 9920 ng/L (ref <18)
Troponin I (High Sensitivity): 14856 ng/L (ref <18)
Troponin I (High Sensitivity): 17310 ng/L (ref <18)

## 2020-02-07 LAB — GLUCOSE, CAPILLARY
Glucose-Capillary: 126 mg/dL — ABNORMAL HIGH (ref 70–99)
Glucose-Capillary: 144 mg/dL — ABNORMAL HIGH (ref 70–99)

## 2020-02-07 LAB — RESPIRATORY PANEL BY RT PCR (FLU A&B, COVID)
Influenza A by PCR: NEGATIVE
Influenza B by PCR: NEGATIVE
SARS Coronavirus 2 by RT PCR: NEGATIVE

## 2020-02-07 LAB — BASIC METABOLIC PANEL
Anion gap: 16 — ABNORMAL HIGH (ref 5–15)
BUN: 17 mg/dL (ref 6–20)
CO2: 20 mmol/L — ABNORMAL LOW (ref 22–32)
Calcium: 9.8 mg/dL (ref 8.9–10.3)
Chloride: 103 mmol/L (ref 98–111)
Creatinine, Ser: 1.01 mg/dL (ref 0.61–1.24)
GFR calc Af Amer: >60 mL/min (ref >60)
GFR calc non Af Amer: >60 mL/min (ref >60)
Glucose, Bld: 190 mg/dL — ABNORMAL HIGH (ref 70–99)
Potassium: 4 mmol/L (ref 3.5–5.1)
Sodium: 139 mmol/L (ref 135–145)

## 2020-02-07 LAB — HIV ANTIBODY (ROUTINE TESTING W REFLEX): HIV Screen 4th Generation wRfx: NONREACTIVE

## 2020-02-07 LAB — PROTIME-INR
INR: 1.1 (ref 0.8–1.2)
Prothrombin Time: 14 seconds (ref 11.4–15.2)

## 2020-02-07 LAB — BRAIN NATRIURETIC PEPTIDE: B Natriuretic Peptide: 53 pg/mL (ref 0.0–100.0)

## 2020-02-07 LAB — APTT: aPTT: 102 seconds — ABNORMAL HIGH (ref 24–36)

## 2020-02-07 LAB — HEPARIN LEVEL (UNFRACTIONATED): Heparin Unfractionated: 0.55 IU/mL (ref 0.30–0.70)

## 2020-02-07 MED ORDER — METOPROLOL TARTRATE 25 MG PO TABS
25.0000 mg | ORAL_TABLET | Freq: Once | ORAL | Status: AC
  Administered 2020-02-07: 25 mg via ORAL
  Filled 2020-02-07: qty 1

## 2020-02-07 MED ORDER — KETOROLAC TROMETHAMINE 30 MG/ML IJ SOLN
15.0000 mg | Freq: Once | INTRAMUSCULAR | Status: AC
  Administered 2020-02-07: 15 mg via INTRAVENOUS
  Filled 2020-02-07: qty 1

## 2020-02-07 MED ORDER — ACETAMINOPHEN 325 MG PO TABS: 650.0000 mg | ORAL_TABLET | Freq: Four times a day (QID) | ORAL | Status: DC | PRN

## 2020-02-07 MED ORDER — ONDANSETRON HCL 4 MG/2ML IJ SOLN
4.0000 mg | Freq: Three times a day (TID) | INTRAMUSCULAR | Status: DC | PRN
  Administered 2020-02-08 (×2): 4 mg via INTRAVENOUS
  Filled 2020-02-07 (×2): qty 2

## 2020-02-07 MED ORDER — TAMSULOSIN HCL 0.4 MG PO CAPS
0.4000 mg | ORAL_CAPSULE | Freq: Every day | ORAL | Status: DC
  Administered 2020-02-07 – 2020-02-11 (×5): 0.4 mg via ORAL
  Filled 2020-02-07 (×5): qty 1

## 2020-02-07 MED ORDER — SODIUM CHLORIDE 0.9 % IV SOLN: INTRAVENOUS | Status: DC

## 2020-02-07 MED ORDER — LABETALOL HCL 5 MG/ML IV SOLN
10.0000 mg | Freq: Once | INTRAVENOUS | Status: AC
  Administered 2020-02-07: 10 mg via INTRAVENOUS
  Filled 2020-02-07: qty 4

## 2020-02-07 MED ORDER — MORPHINE SULFATE (PF) 4 MG/ML IV SOLN
4.0000 mg | Freq: Once | INTRAVENOUS | Status: AC
  Administered 2020-02-07: 4 mg via INTRAVENOUS
  Filled 2020-02-07: qty 1

## 2020-02-07 MED ORDER — NEOMYCIN-POLYMYXIN-DEXAMETH 3.5-10000-0.1 OP SUSP
1.0000 [drp] | Freq: Every day | OPHTHALMIC | Status: DC
  Filled 2020-02-07: qty 5

## 2020-02-07 MED ORDER — MORPHINE SULFATE (PF) 4 MG/ML IV SOLN
4.0000 mg | INTRAVENOUS | Status: DC | PRN
  Administered 2020-02-07: 4 mg via INTRAVENOUS
  Filled 2020-02-07: qty 1

## 2020-02-07 MED ORDER — LORATADINE 10 MG PO TABS
10.0000 mg | ORAL_TABLET | Freq: Every day | ORAL | Status: DC
  Administered 2020-02-07 – 2020-02-12 (×5): 10 mg via ORAL
  Filled 2020-02-07 (×5): qty 1

## 2020-02-07 MED ORDER — ALLOPURINOL 100 MG PO TABS
100.0000 mg | ORAL_TABLET | Freq: Every day | ORAL | Status: DC
  Administered 2020-02-07 – 2020-02-12 (×6): 100 mg via ORAL
  Filled 2020-02-07 (×6): qty 1

## 2020-02-07 MED ORDER — HEPARIN (PORCINE) 25000 UT/250ML-% IV SOLN
1200.0000 [IU]/h | INTRAVENOUS | Status: DC
  Administered 2020-02-07 – 2020-02-10 (×4): 1200 [IU]/h via INTRAVENOUS
  Filled 2020-02-07 (×4): qty 250

## 2020-02-07 MED ORDER — KETOROLAC TROMETHAMINE 15 MG/ML IJ SOLN
15.0000 mg | Freq: Four times a day (QID) | INTRAMUSCULAR | Status: DC
  Administered 2020-02-07 – 2020-02-09 (×8): 15 mg via INTRAVENOUS
  Filled 2020-02-07 (×8): qty 1

## 2020-02-07 MED ORDER — ALPRAZOLAM 0.25 MG PO TABS
0.2500 mg | ORAL_TABLET | Freq: Two times a day (BID) | ORAL | Status: DC | PRN
  Administered 2020-02-09 – 2020-02-11 (×3): 0.25 mg via ORAL
  Filled 2020-02-07 (×3): qty 1

## 2020-02-07 MED ORDER — INSULIN ASPART 100 UNIT/ML ~~LOC~~ SOLN
0.0000 [IU] | Freq: Three times a day (TID) | SUBCUTANEOUS | Status: DC
  Administered 2020-02-07: 1 [IU] via SUBCUTANEOUS
  Administered 2020-02-08: 2 [IU] via SUBCUTANEOUS
  Administered 2020-02-08 – 2020-02-09 (×3): 1 [IU] via SUBCUTANEOUS
  Administered 2020-02-10 – 2020-02-11 (×2): 2 [IU] via SUBCUTANEOUS
  Administered 2020-02-11: 3 [IU] via SUBCUTANEOUS
  Administered 2020-02-11 – 2020-02-12 (×2): 2 [IU] via SUBCUTANEOUS
  Filled 2020-02-07 (×10): qty 1

## 2020-02-07 MED ORDER — LORAZEPAM 2 MG/ML IJ SOLN
2.0000 mg | Freq: Once | INTRAMUSCULAR | Status: AC
  Administered 2020-02-07: 09:00:00 2 mg via INTRAVENOUS

## 2020-02-07 MED ORDER — MORPHINE SULFATE (PF) 2 MG/ML IV SOLN
2.0000 mg | INTRAVENOUS | Status: DC | PRN
  Administered 2020-02-07 – 2020-02-08 (×5): 2 mg via INTRAVENOUS
  Filled 2020-02-07 (×5): qty 1

## 2020-02-07 MED ORDER — HYDRALAZINE HCL 25 MG PO TABS
25.0000 mg | ORAL_TABLET | Freq: Two times a day (BID) | ORAL | Status: DC
  Administered 2020-02-07 – 2020-02-12 (×9): 25 mg via ORAL
  Filled 2020-02-07 (×9): qty 1

## 2020-02-07 MED ORDER — DIPHENHYDRAMINE HCL 25 MG PO CAPS
50.0000 mg | ORAL_CAPSULE | Freq: Every day | ORAL | Status: DC
  Administered 2020-02-07 – 2020-02-11 (×5): 50 mg via ORAL
  Filled 2020-02-07 (×7): qty 2

## 2020-02-07 MED ORDER — HYDRALAZINE HCL 25 MG PO TABS: 25.0000 mg | ORAL_TABLET | Freq: Three times a day (TID) | ORAL | Status: DC | PRN

## 2020-02-07 MED ORDER — NITROGLYCERIN 0.4 MG SL SUBL
0.4000 mg | SUBLINGUAL_TABLET | SUBLINGUAL | Status: DC | PRN
  Administered 2020-02-09: 0.4 mg via SUBLINGUAL
  Filled 2020-02-07: qty 1

## 2020-02-07 MED ORDER — MELATONIN 5 MG PO TABS
10.0000 mg | ORAL_TABLET | Freq: Every day | ORAL | Status: DC
  Administered 2020-02-07 – 2020-02-11 (×5): 10 mg via ORAL
  Filled 2020-02-07 (×5): qty 2

## 2020-02-07 MED ORDER — HEPARIN BOLUS VIA INFUSION
4000.0000 [IU] | Freq: Once | INTRAVENOUS | Status: AC
  Administered 2020-02-07: 4000 [IU] via INTRAVENOUS
  Filled 2020-02-07: qty 4000

## 2020-02-07 MED ORDER — ASPIRIN 81 MG PO CHEW
324.0000 mg | CHEWABLE_TABLET | Freq: Once | ORAL | Status: AC
  Administered 2020-02-07: 324 mg via ORAL
  Filled 2020-02-07: qty 4

## 2020-02-07 MED ORDER — METOPROLOL TARTRATE 25 MG PO TABS
25.0000 mg | ORAL_TABLET | Freq: Two times a day (BID) | ORAL | Status: DC
  Administered 2020-02-07 – 2020-02-12 (×9): 25 mg via ORAL
  Filled 2020-02-07 (×9): qty 1

## 2020-02-07 MED ORDER — LIDOCAINE HCL (CARDIAC) PF 100 MG/5ML IV SOSY
120.0000 mg | PREFILLED_SYRINGE | Freq: Once | INTRAVENOUS | Status: AC
  Administered 2020-02-07: 120 mg via INTRAVENOUS
  Filled 2020-02-07: qty 10

## 2020-02-07 MED ORDER — VITAMIN B-12 1000 MCG PO TABS
1000.0000 ug | ORAL_TABLET | ORAL | Status: DC
  Administered 2020-02-07: 1000 ug via ORAL
  Filled 2020-02-07 (×2): qty 1

## 2020-02-07 MED ORDER — NITROGLYCERIN 2 % TD OINT
1.0000 [in_us] | TOPICAL_OINTMENT | Freq: Once | TRANSDERMAL | Status: AC
  Administered 2020-02-07: 13:00:00 1 [in_us] via TOPICAL
  Filled 2020-02-07: qty 1

## 2020-02-07 MED ORDER — ASPIRIN EC 81 MG PO TBEC
81.0000 mg | DELAYED_RELEASE_TABLET | Freq: Every day | ORAL | Status: DC
  Administered 2020-02-08 – 2020-02-12 (×5): 81 mg via ORAL
  Filled 2020-02-07 (×5): qty 1

## 2020-02-07 MED ORDER — PREGABALIN 50 MG PO CAPS
50.0000 mg | ORAL_CAPSULE | Freq: Three times a day (TID) | ORAL | Status: DC
  Administered 2020-02-07 – 2020-02-12 (×14): 50 mg via ORAL
  Filled 2020-02-07 (×14): qty 1

## 2020-02-07 MED ORDER — ATORVASTATIN CALCIUM 80 MG PO TABS
80.0000 mg | ORAL_TABLET | Freq: Every day | ORAL | Status: DC
  Administered 2020-02-07 – 2020-02-11 (×5): 80 mg via ORAL
  Filled 2020-02-07 (×5): qty 1

## 2020-02-07 MED ORDER — SODIUM CHLORIDE 0.9 % IV SOLN: Freq: Once | INTRAVENOUS | Status: AC

## 2020-02-07 MED ORDER — LORAZEPAM 2 MG/ML IJ SOLN
INTRAMUSCULAR | Status: AC
  Filled 2020-02-07: qty 1

## 2020-02-07 MED ORDER — SODIUM CHLORIDE 0.9 % IV BOLUS
500.0000 mL | Freq: Once | INTRAVENOUS | Status: AC
  Administered 2020-02-07: 500 mL via INTRAVENOUS

## 2020-02-07 MED ORDER — METOPROLOL TARTRATE 5 MG/5ML IV SOLN
5.0000 mg | Freq: Once | INTRAVENOUS | Status: AC
  Administered 2020-02-07: 5 mg via INTRAVENOUS
  Filled 2020-02-07: qty 5

## 2020-02-07 MED ORDER — ONDANSETRON HCL 4 MG/2ML IJ SOLN
4.0000 mg | Freq: Once | INTRAMUSCULAR | Status: AC
  Administered 2020-02-07: 4 mg via INTRAVENOUS
  Filled 2020-02-07: qty 2

## 2020-02-07 MED ORDER — INSULIN ASPART 100 UNIT/ML ~~LOC~~ SOLN
0.0000 [IU] | Freq: Every day | SUBCUTANEOUS | Status: DC
  Administered 2020-02-10: 2 [IU] via SUBCUTANEOUS
  Administered 2020-02-11: 4 [IU] via SUBCUTANEOUS
  Filled 2020-02-07 (×2): qty 1

## 2020-02-07 MED ORDER — HYDRALAZINE HCL 50 MG PO TABS
25.0000 mg | ORAL_TABLET | Freq: Once | ORAL | Status: AC
  Administered 2020-02-07: 25 mg via ORAL
  Filled 2020-02-07: qty 1

## 2020-02-07 MED ORDER — TIZANIDINE HCL 4 MG PO TABS
4.0000 mg | ORAL_TABLET | Freq: Three times a day (TID) | ORAL | Status: DC | PRN
  Filled 2020-02-07: qty 1

## 2020-02-07 MED ORDER — OXYCODONE-ACETAMINOPHEN 5-325 MG PO TABS
1.0000 | ORAL_TABLET | ORAL | Status: DC | PRN
  Administered 2020-02-08 – 2020-02-10 (×3): 1 via ORAL
  Filled 2020-02-07 (×2): qty 1

## 2020-02-07 MED ORDER — METOPROLOL TARTRATE 5 MG/5ML IV SOLN: 5.0000 mg | INTRAVENOUS | Status: DC | PRN

## 2020-02-07 NOTE — ED Triage Notes (Signed)
Pt reports that he was awakened at 0500 this am with rt sided flank pain and rt lower abd pain, reports no change in urination, states that he has never felt this way, pt currently dry heaving in triage

## 2020-02-07 NOTE — Consult Note (Signed)
02/07/20 1:11 PM   John Hartman Feb 08, 1971 [id number]  CC: right flank pain  HPI: I was asked to see Mr. Prokop by Dr. Quentin Cornwall for evaluation of right renal colic.  He is a 49 year old male with extensive cardiac history who presents with 12 hours of acute onset severe right renal colic as well as nausea.  His pain is primarily in the right low back and radiating to the groin, but he has also had some pain radiating to the left shoulder.  CT shows a 7 mm right proximal ureteral stone with upstream hydronephrosis.  The stone is not visible on KUB.  Urinalysis is benign with no evidence of infection.  Troponin is significantly elevated at 1,460.  His pain control has improved significantly with IV lidocaine.  He denies any shortness of breath.  He previously underwent left-sided ureteroscopy in November 2020 and he did not tolerate his ureteral stent well.  He had multiple ER visits for uncontrolled pain and nausea secondary to stent pain, and the stent ultimately had to be removed early in clinic secondary to his uncontrolled pain.   PMH: Past Medical History:  Diagnosis Date  . Angioedema   . Bronchitis   . DDD (degenerative disc disease), cervical   . Diabetes mellitus ([place])   . Fatty liver 03/31/2017   [place] April 2018  . GERD (gastroesophageal reflux disease)   . Gout   . Heart attack ([place])   . HTN (hypertension)   . Hypertension    CONTROLLED ON MEDS  . IBS (irritable bowel syndrome)   . Kidney stone   . LVH (left ventricular hypertrophy) 11/24/2018   Moderate, ECHO  . Myocardial infarction ([place]) 08/23/2018  . Seasonal allergies   . Spinal stenosis     Surgical History: Past Surgical History:  Procedure Laterality Date  . ACDF with fusion  2007  . CARDIAC CATHETERIZATION Left 08/15/2016   Procedure: Left Heart Cath and Coronary Angiography;  Surgeon: Wellington Hampshire, MD;  Location: [place] [facility];  Service: Cardiovascular;  Laterality: Left;  . COLONOSCOPY WITH  PROPOFOL N/A 06/26/2015   Procedure: COLONOSCOPY WITH PROPOFOL;  Surgeon: Lucilla Lame, MD;  Location: [place];  Service: Endoscopy;  Laterality: N/A;  with biopsies  . CORONARY/GRAFT ACUTE MI REVASCULARIZATION N/A 08/23/2018   Procedure: Coronary/Graft Acute MI Revascularization;  Surgeon: Isaias Cowman, MD;  Location: [place] [facility];  Service: Cardiovascular;  Laterality: N/A;  . CYSTOSCOPY WITH BIOPSY N/A 11/22/2019   Procedure: CYSTOSCOPY WITH Bladder BIOPSY & Almyra Free;  Surgeon: Billey Co, MD;  Location: [facility];  Service: Urology;  Laterality: N/A;  . CYSTOSCOPY/URETEROSCOPY/HOLMIUM LASER/STENT PLACEMENT Left 11/22/2019   Procedure: CYSTOSCOPY/URETEROSCOPY/BILATERAL RETROGRADE PYELOGRAM/HOLMIUM LASER/STENT PLACEMENT;  Surgeon: Billey Co, MD;  Location: [facility];  Service: Urology;  Laterality: Left;  . LEFT HEART CATH AND CORONARY ANGIOGRAPHY N/A 08/23/2018   Procedure: LEFT HEART CATH AND CORONARY ANGIOGRAPHY;  Surgeon: Isaias Cowman, MD;  Location: [place] [facility];  Service: Cardiovascular;  Laterality: N/A;  . NASAL SEPTUM SURGERY  2004  . SHOULDER ARTHROSCOPY W/ ROTATOR CUFF REPAIR  10/01/2019  . SHOULDER SURGERY Left 2007  . Testicular torsion  1980s  . VASECTOMY      Allergies:  Allergies  Allergen Reactions  . Lisinopril Swelling  . Ace Inhibitors Swelling    Family History: Family History  Problem Relation Age of Onset  . Breast cancer Mother   . Skin cancer Mother   . Arrhythmia Mother  A-fib  . Cancer Mother        breast, colon?, skin  . Hypertension Father   . Diabetes Father   . Cancer Maternal Grandfather   . Heart disease Brother        stent  . Allergies Son   . Alzheimer's disease Maternal Grandmother   . Stroke Neg Hx   . COPD Neg Hx     Social History:  reports that he quit smoking about 4 years ago. His smoking use included cigarettes. He has a 10.00 pack-year smoking history. He has never  used smokeless tobacco. He reports that he does not drink alcohol or use drugs.  ROS: Negative aside from those stated in the HPI  Physical Exam: BP (!) 162/120   Pulse (!) 106   Temp 97.8 F (36.6 C) (Oral)   Resp 17   Ht 5' 11" (1.803 m)   Wt 88.5 kg   SpO2 94%   BMI 27.20 kg/m    Constitutional:  Alert and oriented, No acute distress. Cardiovascular: No clubbing, cyanosis, or edema. Respiratory: Normal respiratory effort, no increased work of breathing. GI: Abdomen is soft, nontender, nondistended, no abdominal masses GU: Right CVA tenderness Lymph: No cervical or inguinal lymphadenopathy. Skin: No rashes, bruises or suspicious lesions. Neurologic: Grossly intact, no focal deficits, moving all 4 extremities. Psychiatric: Normal mood and affect.  Laboratory Data: WBC 9.5 Creatinine 1.01, EGFR greater than 60 Urinalysis with greater than 50 RBCs, 11-20 WBCs, no bacteria, nitrite negative Covid pending  Pertinent Imaging: I have personally reviewed the CT showing a 7 mm right proximal ureteral stone with upstream hydronephrosis.  Not visible on KUB.  Assessment & Plan:   In summary, he is a 48-year-old male with extensive cardiac history who presents with 12 hours of right-sided flank pain as well as some pain that radiates to his left shoulder with imaging showing a 7 mm right proximal ureteral stone with no signs of infection, and troponin significantly elevated at 1,460 worrisome for NSTEMI.  First, I recommended cardiology evaluation regarding his elevated troponin and NSTEMI prior to any urologic intervention.  We discussed treatment options at length for his non-infected obstructing stone.  Shockwave lithotripsy is not an option, as the stone cannot be seen on KUB.  Medical expulsive therapy with Flomax and outpatient pain control would only have approximately 30% chance of spontaneous passage, but would avoid surgery and a ureteral stent.  We discussed ureteroscopy,  laser lithotripsy, and stent placement at length.  He is very hesitant to undergo ureteroscopy, as he had significant pain and nausea from his ureteral stent on the contralateral side previously.  We discussed the risks and benefits at length including bleeding, infection, inability to access the stone requiring a staged procedure, and stent related symptoms.  If we ultimately do pursue ureteroscopy and laser lithotripsy, would leave a ureteral stent on a Dangler so he could remove the stent himself if he was having severe pain on that side.  We discussed the risk of ureteral edema causing renal colic if the stent is removed prematurely.  Agree with cardiology evaluation/work up No contraindication to anticoagulation from a urology perspective Will re-evaluate tomorrow morning and discuss possible right ureteroscopy, laser lithotripsy, stent Please keep NPO at midnight in case of need for urologic intervention tomorrow morning   , MD 02/07/2020  [facility] [address], [address] [place], [place] [address] [phone]   

## 2020-02-07 NOTE — ED Notes (Signed)
Pt placed on 1 L [place] d/t desatting

## 2020-02-07 NOTE — ED Notes (Signed)
Pt taken to CT via wheelchair.

## 2020-02-07 NOTE — ED Notes (Signed)
Pt returned from CT °

## 2020-02-07 NOTE — Consult Note (Signed)
[facility] Consultation Note  Patient ID: John Hartman, MRN: [id number], DOB/AGE: 47-Sep-1972 47 y.o. Admit date: 02/07/2020   Date of Consult: 02/07/2020 Primary Physician: [name], FNP Primary Cardiologist: Paraschos  Chief Complaint:  Chief Complaint  Patient presents with  . Flank Pain   Reason for Consult: Chest pain  HPI: 49 y.o. male with known coronary artery disease hypertension hyperlipidemia and previous stent placements who has been on appropriate medication management including metoprolol and antilipid medication management.  The patient has had some significant new trouble with kidney stones for which she is needed ureteral stents.  In preparation for this the patient has had evaluation for which she was stable.  He had new onset of severe flank pain radiating into his chest and then had worsening chest discomfort for which was more fleeting over a several minute.  There was no evidence of myocardial infarction by EKG but patient did have full resolution of chest discomfort.  At that time his troponin did elevate to 1400.  There has been concerns of cardiovascular symptoms  Past Medical History:  Diagnosis Date  . Angioedema   . Bronchitis   . DDD (degenerative disc disease), cervical   . Diabetes mellitus ([place])   . Fatty liver 03/31/2017   [place] April 2018  . GERD (gastroesophageal reflux disease)   . Gout   . Heart attack ([place])   . HTN (hypertension)   . Hypertension    CONTROLLED ON MEDS  . IBS (irritable bowel syndrome)   . Kidney stone   . LVH (left ventricular hypertrophy) 11/24/2018   Moderate, ECHO  . Myocardial infarction ([place]) 08/23/2018  . Seasonal allergies   . Spinal stenosis       Surgical History:  Past Surgical History:  Procedure Laterality Date  . ACDF with fusion  2007  . CARDIAC CATHETERIZATION Left 08/15/2016   Procedure: Left Heart Cath and Coronary Angiography;  Surgeon: Wellington Hampshire, MD;  Location: [place] [facility];  Service: Cardiovascular;  Laterality: Left;  . COLONOSCOPY WITH PROPOFOL N/A 06/26/2015   Procedure: COLONOSCOPY WITH PROPOFOL;  Surgeon: Lucilla Lame, MD;  Location: [place];  Service: Endoscopy;  Laterality: N/A;  with biopsies  . CORONARY/GRAFT ACUTE MI REVASCULARIZATION N/A 08/23/2018   Procedure: Coronary/Graft Acute MI Revascularization;  Surgeon: Isaias Cowman, MD;  Location: [place] [facility];  Service: Cardiovascular;  Laterality: N/A;  . CYSTOSCOPY WITH BIOPSY N/A 11/22/2019   Procedure: CYSTOSCOPY WITH Bladder BIOPSY & Almyra Free;  Surgeon: Billey Co, MD;  Location: [facility];  Service: Urology;  Laterality: N/A;  . CYSTOSCOPY/URETEROSCOPY/HOLMIUM LASER/STENT PLACEMENT Left 11/22/2019   Procedure: CYSTOSCOPY/URETEROSCOPY/BILATERAL RETROGRADE PYELOGRAM/HOLMIUM LASER/STENT PLACEMENT;  Surgeon: Billey Co, MD;  Location: [facility];  Service: Urology;  Laterality: Left;  . LEFT HEART CATH AND CORONARY ANGIOGRAPHY N/A 08/23/2018   Procedure: LEFT HEART CATH AND CORONARY ANGIOGRAPHY;  Surgeon: Isaias Cowman, MD;  Location: [place] [facility];  Service: Cardiovascular;  Laterality: N/A;  . NASAL SEPTUM SURGERY  2004  . SHOULDER ARTHROSCOPY W/ ROTATOR CUFF REPAIR  10/01/2019  . SHOULDER SURGERY Left 2007  . Testicular torsion  1980s  . VASECTOMY       Home Meds: Prior to Admission medications   Medication Sig Start Date End Date Taking? Authorizing Provider  allopurinol (ZYLOPRIM) 100 MG tablet Take 100 mg by mouth daily.    Yes [provider]  aspirin EC 81 MG tablet Take 81 mg by mouth daily.  Yes [provider]  atorvastatin (LIPITOR) 80 MG tablet Take 1 tablet (80 mg total) by mouth daily at 6 PM. Patient taking differently: Take 80 mg by mouth daily with supper.  08/25/18 11/19/20 Yes Pyreddy, Reatha Harps, MD  cetirizine (ZYRTEC) 10 MG tablet Take 10 mg by mouth at bedtime.   Yes [provider]   Dapagliflozin-metFORMIN HCl ER (XIGDUO XR) 04-999 MG TB24 Take 2 tablets by mouth daily.    Yes [provider]  diphenhydrAMINE (BENADRYL) 25 MG tablet Take 50 mg by mouth at bedtime.   Yes [provider]  hydrALAZINE (APRESOLINE) 25 MG tablet Take 1 tablet (25 mg total) by mouth 2 (two) times daily. 08/25/18 11/19/20 Yes Pyreddy, Reatha Harps, MD  Melatonin 5 MG TABS Take 10 mg by mouth at bedtime.    Yes [provider]  metoprolol tartrate (LOPRESSOR) 25 MG tablet Take 25 mg by mouth 2 (two) times daily. 08/22/19  Yes [provider]  neomycin-polymyxin b-dexamethasone (MAXITROL) 3.5-10000-0.1 SUSP Place 1 drop into the left eye daily. 01/27/20  Yes [provider]  pregabalin (LYRICA) 50 MG capsule Take 1 capsule (50 mg total) by mouth 3 (three) times daily. 01/06/20  Yes Tapia, Leisa, PA-C  RYBELSUS 3 MG TABS Take 3 mg by mouth daily at 6 (six) AM.    Yes [provider]  tiZANidine (ZANAFLEX) 4 MG tablet Take 1 tablet (4 mg total) by mouth every 8 (eight) hours as needed for muscle spasms. 07/17/19  Yes Gillis Santa, MD  vitamin B-12 (CYANOCOBALAMIN) 1000 MCG tablet Take 1,000 mcg by mouth 3 (three) times a week.   Yes [provider]  ACCU-CHEK SOFTCLIX LANCETS lancets USE AS DIRECTED 07/23/16   Lada, Satira Anis, MD  BELBUCA 450 MCG FILM Place 1 Film inside cheek 2 (two) times daily. Patient not taking: Reported on 02/07/2020 07/17/19   Gillis Santa, MD  ezetimibe (ZETIA) 10 MG tablet Take 1 tablet (10 mg total) by mouth daily. Patient not taking: Reported on 01/06/2020 12/03/18   Arnetha Courser, MD  ketorolac (TORADOL) 10 MG tablet Take 1 tablet (10 mg total) by mouth every 6 (six) hours as needed. Patient not taking: Reported on 02/07/2020 11/24/19   Blake Divine, MD  Menthol, Topical Analgesic, (BIOFREEZE EX) Apply 1 application topically 3 (three) times daily as needed (muscle cramps/shoulder pain.).    [provider]   nitroGLYCERIN (NITROSTAT) 0.4 MG SL tablet Place 1 tablet (0.4 mg total) under the tongue every 5 (five) minutes as needed for chest pain. 08/25/18   Saundra Shelling, MD  [facility] VERIO test strip USE 2 (TWO) TIMES DAILY USE AS INSTRUCTED. 09/16/19   [provider]    Inpatient Medications:  . aspirin  324 mg Oral Once  . LORazepam       . sodium chloride    . heparin 1,200 Units/hr (02/07/20 1402)    Allergies:  Allergies  Allergen Reactions  . Lisinopril Swelling  . Ace Inhibitors Swelling    Social History   Socioeconomic History  . Marital status: Married    Spouse name: Not on file  . Number of children: Not on file  . Years of education: Not on file  . Highest education level: Not on file  Occupational History  . Not on file  Tobacco Use  . Smoking status: Former Smoker    Packs/day: 1.00    Years: 10.00    Pack years: 10.00    Types: Cigarettes  Quit date: 04/15/2015    Years since quitting: 4.8  . Smokeless tobacco: Never Used  Substance and Sexual Activity  . Alcohol use: No    Alcohol/week: 0.0 standard drinks  . Drug use: No  . Sexual activity: Yes  Other Topics Concern  . Not on file  Social History Narrative  . Not on file   Social Determinants of Health   Financial Resource Strain:   . Difficulty of Paying Living Expenses: Not on file  Food Insecurity:   . Worried About Charity fundraiser in the Last Year: Not on file  . Ran Out of Food in the Last Year: Not on file  Transportation Needs:   . Lack of Transportation (Medical): Not on file  . Lack of Transportation (Non-Medical): Not on file  Physical Activity:   . Days of Exercise per Week: Not on file  . Minutes of Exercise per Session: Not on file  Stress:   . Feeling of Stress : Not on file  Social Connections:   . Frequency of Communication with Friends and Family: Not on file  . Frequency of Social Gatherings with Friends and Family: Not on file  . Attends Religious  Services: Not on file  . Active Member of Clubs or Organizations: Not on file  . Attends Archivist Meetings: Not on file  . Marital Status: Not on file  Intimate Partner Violence:   . Fear of Current or Ex-Partner: Not on file  . Emotionally Abused: Not on file  . Physically Abused: Not on file  . Sexually Abused: Not on file     Family History  Problem Relation Age of Onset  . Breast cancer Mother   . Skin cancer Mother   . Arrhythmia Mother        A-fib  . Cancer Mother        breast, colon?, skin  . Hypertension Father   . Diabetes Father   . Cancer Maternal Grandfather   . Heart disease Brother        stent  . Allergies Son   . Alzheimer's disease Maternal Grandmother   . Stroke Neg Hx   . COPD Neg Hx      Review of Systems Positive for flank pain chest pain Negative for: General:  chills, fever, night sweats or weight changes.  Cardiovascular: PND orthopnea syncope dizziness  Dermatological skin lesions rashes Respiratory: Cough congestion Urologic: Frequent urination urination at night and hematuria Abdominal: negative for nausea, vomiting, diarrhea, bright red blood per rectum, melena, or hematemesis Neurologic: negative for visual changes, and/or hearing changes  All other systems reviewed and are otherwise negative except as noted above.  Labs: No results for input(s): CKTOTAL, CKMB, TROPONINI in the last 72 hours. Lab Results  Component Value Date   WBC 9.5 02/07/2020   HGB 16.6 02/07/2020   HCT 51.0 02/07/2020   MCV 80.6 02/07/2020   PLT 221 02/07/2020    Recent Labs  Lab 02/07/20 0806  NA 139  K 4.0  CL 103  CO2 20*  BUN 17  CREATININE 1.01  CALCIUM 9.8  GLUCOSE 190*   Lab Results  Component Value Date   CHOL 135 01/06/2020   HDL 33 (L) 01/06/2020   LDLCALC 78 01/06/2020   TRIG 140 01/06/2020   No results found for: DDIMER  Radiology/Studies:  DG Abdomen 1 View  Result Date: 02/07/2020 CLINICAL DATA:  Right flank pain.  EXAM: ABDOMEN - 1 VIEW COMPARISON:  November 27, 2019. FINDINGS: The bowel gas pattern is normal. Bilateral nephrolithiasis is noted. Left ureteral stent is not visualized and has been removed. IMPRESSION: Bilateral nephrolithiasis. Left ureteral stent has been removed. No evidence of bowel obstruction or ileus. Electronically Signed   By: Marijo Conception M.D.   On: 02/07/2020 12:20   CT Renal Stone Study  Result Date: 02/07/2020 CLINICAL DATA:  And flank pain EXAM: CT ABDOMEN AND PELVIS WITHOUT CONTRAST TECHNIQUE: Multidetector CT imaging of the abdomen and pelvis was performed following the standard protocol without oral or IV contrast. COMPARISON:  March 26, 2019 FINDINGS: Lower chest: Lung bases are clear. There are foci of coronary artery calcification. Hepatobiliary: No focal liver lesions are evident on this noncontrast enhanced study. Gallbladder wall is not appreciably thickened. There is no biliary duct dilatation. Pancreas: There is no pancreatic mass or inflammatory focus. Spleen: No splenic lesions are evident. Adrenals/Urinary Tract: Adrenals bilaterally appear normal. Right kidney is subtly edematous. There is a cyst in the mid left kidney measuring 1.2 x 1.0 cm, better seen on prior contrast enhanced study. There is mild hydronephrosis on the right. There is no appreciable hydronephrosis on the left. There is a 3 x 2 mm calculus in the lower pole of the right kidney. There is a 4 x 3 mm calculus in the lower pole of the left kidney. There is a calculus in the proximal right ureter at the L3-4 level measuring 7 x 5 mm. No other ureteral calculi are evident. Urinary bladder is midline with wall thickness within normal limits. Stomach/Bowel: There is no appreciable bowel wall or mesenteric thickening. There is moderate stool in the colon. Note that most small bowel loops are fluid-filled. There is no demonstrable bowel obstruction. The terminal ileum appears unremarkable. There is no evident free air  or portal venous air. Vascular/Lymphatic: There is aortic atherosclerosis. There is calcification in the proximal right renal artery. There is also mild calcification in the left common iliac artery. There is no evident abdominal aortic aneurysm. No adenopathy evident in the abdomen or pelvis. Reproductive: There are a few prostatic calculi. Prostate and seminal vesicles are normal in size and contour. No pelvic mass evident. Other: Appendix appears normal. There is no evident abscess or ascites in the abdomen or pelvis. There is fat in each inguinal ring. Musculoskeletal: No blastic or lytic bone lesions. There is degenerative change in the thoracic spine. No intramuscular lesions are evident. IMPRESSION: 1. 7 x 5 mm calculus in the proximal right ureter at the L3-4 level with mild hydronephrosis and mild right renal edema. 2.  Nonobstructing calculus in the lower pole of each kidney. 3. Fluid throughout most small bowel loops, a finding that may indicate a degree of enteritis or ileus. Ileus secondary to the obstructing calculus on the right is quite possible. No bowel obstruction evident. 4.  No abscess.  Appendix appears normal. 5. Aortic Atherosclerosis (ICD10-I70.0). There is also calcification in the right proximal renal artery as well as foci of coronary artery calcification. Electronically Signed   By: Lowella Grip III M.D.   On: 02/07/2020 08:42    EKG: Normal sinus rhythm  Weights: Filed Weights   02/07/20 0756  Weight: 88.5 kg     Physical Exam: Blood pressure (!) 159/116, pulse 96, temperature 97.8 F (36.6 C), temperature source Oral, resp. rate 17, height 5\' 11"  (1.803 m), weight 88.5 kg, SpO2 96 %. Body mass index is 27.2 kg/m. General: Well developed, well nourished, in no acute distress.  Head eyes ears nose throat: Normocephalic, atraumatic, sclera non-icteric, no xanthomas, nares are without discharge. No apparent thyromegaly and/or mass  Lungs: Normal respiratory effort.   no wheezes, no rales, no rhonchi.  Heart: RRR with normal S1 S2. no murmur gallop, no rub, PMI is normal size and placement, carotid upstroke normal without bruit, jugular venous pressure is normal Abdomen: Soft, non-tender, non-distended with normoactive bowel sounds. No hepatomegaly. No rebound/guarding. No obvious abdominal masses. Abdominal aorta is normal size without bruit Extremities: No edema. no cyanosis, no clubbing, no ulcers  Peripheral : 2+ bilateral upper extremity pulses, 2+ bilateral femoral pulses, 2+ bilateral dorsal pedal pulse Neuro: Alert and oriented. No facial asymmetry. No focal deficit. Moves all extremities spontaneously. Musculoskeletal: Normal muscle tone without kyphosis Psych:  Responds to questions appropriately with a normal affect.    Assessment: 49 year old male with coronary artery disease hypertension hyperlipidemia and atypical chest discomfort with elevated troponin concerning for possible cardiovascular disease  Plan: 1.  Heparin for further risk reduction of possible acute coronary syndrome 2.  Reinstatement of metoprolol for heart rate blood pressure control. 3.  Reinstatement of all medication management for further risk reduction of cardiovascular event 4.  Serial ECG and enzymes to assess for possible acute coronary syndrome and further treatment options  Signed, Corey Skains M.D. [facility] 02/07/2020, 3:41 PM

## 2020-02-07 NOTE — H&P (Signed)
History and Physical    John Hartman [id number] DOB: January 13, 1971 DOA: 02/07/2020  Referring MD/NP/PA:   PCP: [name], FNP   Patient coming from:  The patient is coming from home.  At baseline, pt is independent for most of ADL.        Chief Complaint: Right flank pain  HPI: John Hartman is a 49 y.o. male with medical history significant of hypertension, hyperlipidemia, diabetes mellitus, GERD, gout, CAD, myocardial infarction, sCHF with EF 35-40%, who presents with right flank pain.  Patient states that his right flank pain started at about 5 AM, which was constant, severe, 9 out of 10 severity, sharp, radiating to the right groin area.  Associated with nausea and vomiting, but no diarrhea.  No fever or chills.  No symptoms of UTI.  No hematuria.  Patient states that he has one episode of substernal chest pain earlier, which was moderate, and has resolved.  No cough or shortness of breath.  The chest pain was radiating to the right shoulder.  ED Course: pt was found to have troponin 10 -->1460 -->9920, BNP 53, negative RVP for Covid test, WBC 9.5, renal function okay, temperature normal, blood pressure 162/120, tachycardia, tachypnea, oxygen saturation 90 -100% on room air.  CT-renal stone showed a 7 x 5 mm calculus in the proximal right ureter at the L3-4 level, with mild hydronephrosis and mild right renal edema. Pt is admitted to telemetry bed as inpatient.  Cardiology, Dr. Nehemiah Massed and urology, Dr. Diamantina Providence were consulted.    Review of Systems:   General: no fevers, chills, no body weight gain, has fatigue HEENT: no blurry vision, hearing changes or sore throat Respiratory: no dyspnea, coughing, wheezing CV: has chest pain, no palpitations GI: has nausea, vomiting, no abdominal pain, diarrhea, constipation GU: no dysuria, burning on urination, increased urinary frequency, hematuria. Has right flank pain. Ext: no leg edema Neuro: no unilateral weakness, numbness, or tingling, no  vision change or hearing loss Skin: no rash, no skin tear.  MSK: No muscle spasm, no deformity, no limitation of range of movement in spin Heme: No easy bruising.  Travel history: No recent long distant travel.  Allergy:  Allergies  Allergen Reactions  . Lisinopril Swelling  . Ace Inhibitors Swelling    Past Medical History:  Diagnosis Date  . Angioedema   . Bronchitis   . DDD (degenerative disc disease), cervical   . Diabetes mellitus ([place])   . Fatty liver 03/31/2017   [place] April 2018  . GERD (gastroesophageal reflux disease)   . Gout   . Heart attack ([place])   . HTN (hypertension)   . Hypertension    CONTROLLED ON MEDS  . IBS (irritable bowel syndrome)   . Kidney stone   . LVH (left ventricular hypertrophy) 11/24/2018   Moderate, ECHO  . Myocardial infarction ([place]) 08/23/2018  . Seasonal allergies   . Spinal stenosis     Past Surgical History:  Procedure Laterality Date  . ACDF with fusion  2007  . CARDIAC CATHETERIZATION Left 08/15/2016   Procedure: Left Heart Cath and Coronary Angiography;  Surgeon: Wellington Hampshire, MD;  Location: [facility];  Service: Cardiovascular;  Laterality: Left;  . COLONOSCOPY WITH PROPOFOL N/A 06/26/2015   Procedure: COLONOSCOPY WITH PROPOFOL;  Surgeon: Lucilla Lame, MD;  Location: [facility];  Service: Endoscopy;  Laterality: N/A;  with biopsies  . CORONARY/GRAFT ACUTE MI REVASCULARIZATION N/A 08/23/2018   Procedure: Coronary/Graft Acute MI Revascularization;  Surgeon: Isaias Cowman,  MD;  Location: [facility];  Service: Cardiovascular;  Laterality: N/A;  . CYSTOSCOPY WITH BIOPSY N/A 11/22/2019   Procedure: CYSTOSCOPY WITH Bladder BIOPSY & Almyra Free;  Surgeon: Billey Co, MD;  Location: [facility];  Service: Urology;  Laterality: N/A;  . CYSTOSCOPY/URETEROSCOPY/HOLMIUM LASER/STENT PLACEMENT Left 11/22/2019   Procedure: CYSTOSCOPY/URETEROSCOPY/BILATERAL RETROGRADE PYELOGRAM/HOLMIUM LASER/STENT PLACEMENT;   Surgeon: Billey Co, MD;  Location: [facility];  Service: Urology;  Laterality: Left;  . LEFT HEART CATH AND CORONARY ANGIOGRAPHY N/A 08/23/2018   Procedure: LEFT HEART CATH AND CORONARY ANGIOGRAPHY;  Surgeon: Isaias Cowman, MD;  Location: [facility];  Service: Cardiovascular;  Laterality: N/A;  . NASAL SEPTUM SURGERY  2004  . SHOULDER ARTHROSCOPY W/ ROTATOR CUFF REPAIR  10/01/2019  . SHOULDER SURGERY Left 2007  . Testicular torsion  1980s  . VASECTOMY      Social History:  reports that he quit smoking about 4 years ago. His smoking use included cigarettes. He has a 10.00 pack-year smoking history. He has never used smokeless tobacco. He reports that he does not drink alcohol or use drugs.  Family History:  Family History  Problem Relation Age of Onset  . Breast cancer Mother   . Skin cancer Mother   . Arrhythmia Mother        A-fib  . Cancer Mother        breast, colon?, skin  . Hypertension Father   . Diabetes Father   . Cancer Maternal Grandfather   . Heart disease Brother        stent  . Allergies Son   . Alzheimer's disease Maternal Grandmother   . Stroke Neg Hx   . COPD Neg Hx      Prior to Admission medications   Medication Sig Start Date End Date Taking? Authorizing Provider  allopurinol (ZYLOPRIM) 100 MG tablet Take 100 mg by mouth daily.    Yes [provider]  aspirin EC 81 MG tablet Take 81 mg by mouth daily.   Yes [provider]  atorvastatin (LIPITOR) 80 MG tablet Take 1 tablet (80 mg total) by mouth daily at 6 PM. Patient taking differently: Take 80 mg by mouth daily with supper.  08/25/18 11/19/20 Yes Pyreddy, Reatha Harps, MD  cetirizine (ZYRTEC) 10 MG tablet Take 10 mg by mouth at bedtime.   Yes [provider]  Dapagliflozin-metFORMIN HCl ER (XIGDUO XR) 04-999 MG TB24 Take 2 tablets by mouth daily.    Yes [provider]  diphenhydrAMINE (BENADRYL) 25 MG tablet Take 50 mg by mouth at bedtime.   Yes [provider]  hydrALAZINE (APRESOLINE) 25 MG tablet Take 1 tablet (25 mg total) by mouth 2 (two) times daily. 08/25/18 11/19/20 Yes Pyreddy, Reatha Harps, MD  Melatonin 5 MG TABS Take 10 mg by mouth at bedtime.    Yes [provider]  metoprolol tartrate (LOPRESSOR) 25 MG tablet Take 25 mg by mouth 2 (two) times daily. 08/22/19  Yes [provider]  neomycin-polymyxin b-dexamethasone (MAXITROL) 3.5-10000-0.1 SUSP Place 1 drop into the left eye daily. 01/27/20  Yes [provider]  pregabalin (LYRICA) 50 MG capsule Take 1 capsule (50 mg total) by mouth 3 (three) times daily. 01/06/20  Yes Tapia, Leisa, PA-C  RYBELSUS 3 MG TABS Take 3 mg by mouth daily at 6 (six) AM.    Yes [provider]  tiZANidine (ZANAFLEX) 4 MG tablet Take 1 tablet (4 mg total) by mouth every 8 (eight) hours as needed for muscle  spasms. 07/17/19  Yes Gillis Santa, MD  vitamin B-12 (CYANOCOBALAMIN) 1000 MCG tablet Take 1,000 mcg by mouth 3 (three) times a week.   Yes [provider]  ACCU-CHEK SOFTCLIX LANCETS lancets USE AS DIRECTED 07/23/16   Lada, Satira Anis, MD  BELBUCA 450 MCG FILM Place 1 Film inside cheek 2 (two) times daily. Patient not taking: Reported on 02/07/2020 07/17/19   Gillis Santa, MD  ezetimibe (ZETIA) 10 MG tablet Take 1 tablet (10 mg total) by mouth daily. Patient not taking: Reported on 01/06/2020 12/03/18   Arnetha Courser, MD  ketorolac (TORADOL) 10 MG tablet Take 1 tablet (10 mg total) by mouth every 6 (six) hours as needed. Patient not taking: Reported on 02/07/2020 11/24/19   Blake Divine, MD  Menthol, Topical Analgesic, (BIOFREEZE EX) Apply 1 application topically 3 (three) times daily as needed (muscle cramps/shoulder pain.).    [provider]  nitroGLYCERIN (NITROSTAT) 0.4 MG SL tablet Place 1 tablet (0.4 mg total) under the tongue every 5 (five) minutes as needed for chest pain. 08/25/18   Saundra Shelling, MD  [facility] VERIO test strip USE 2 (TWO) TIMES DAILY  USE AS INSTRUCTED. 09/16/19   [provider]    Physical Exam: Vitals:   02/07/20 1445 02/07/20 1500 02/07/20 1541 02/07/20 1631  BP: (!) 165/116 (!) 159/116 (!) 177/157 (!) 156/122  Pulse: (!) 104 96 100 95  Resp:   17   Temp:   98.2 F (36.8 C)   TempSrc:   Oral   SpO2: 96% 96% 99%   Weight:      Height:       General: Not in acute distress HEENT:       Eyes: PERRL, EOMI, no scleral icterus.       ENT: No discharge from the ears and nose, no pharynx injection, no tonsillar enlargement.        Neck: No JVD, no bruit, no mass felt. Heme: No neck lymph node enlargement. Cardiac: S1/S2, RRR, No murmurs, No gallops or rubs. Respiratory: No rales, wheezing, rhonchi or rubs. GI: Soft, nondistended, nontender, no rebound pain, no organomegaly, BS present. GU: No hematuria Ext: No pitting leg edema bilaterally. 2+DP/PT pulse bilaterally. Musculoskeletal: No joint deformities, No joint redness or warmth, no limitation of ROM in spin. Skin: No rashes.  Neuro: Alert, oriented X3, cranial nerves II-XII grossly intact, moves all extremities normally. Psych: Patient is not psychotic, no suicidal or hemocidal ideation.  Labs on Admission: I have personally reviewed following labs and imaging studies  CBC: Recent Labs  Lab 02/07/20 0806  WBC 9.5  HGB 16.6  HCT 51.0  MCV 80.6  PLT [id number]   Basic Metabolic Panel: Recent Labs  Lab 02/07/20 0806  NA 139  K 4.0  CL 103  CO2 20*  GLUCOSE 190*  BUN 17  CREATININE 1.01  CALCIUM 9.8   GFR: Estimated Creatinine Clearance: 97.3 mL/min (by C-G formula based on SCr of 1.01 mg/dL). Liver Function Tests: No results for input(s): AST, ALT, ALKPHOS, BILITOT, PROT, ALBUMIN in the last 168 hours. No results for input(s): LIPASE, AMYLASE in the last 168 hours. No results for input(s): AMMONIA in the last 168 hours. Coagulation Profile: Recent Labs  Lab 02/07/20 1438  INR 1.1   Cardiac Enzymes: No results for input(s):  CKTOTAL, CKMB, CKMBINDEX, TROPONINI in the last 168 hours. BNP (last 3 results) No results for input(s): PROBNP in the last 8760 hours. HbA1C: No results for input(s): HGBA1C in the last  72 hours. CBG: Recent Labs  Lab 02/07/20 1630  GLUCAP 126*   Lipid Profile: No results for input(s): CHOL, HDL, LDLCALC, TRIG, CHOLHDL, LDLDIRECT in the last 72 hours. Thyroid Function Tests: No results for input(s): TSH, T4TOTAL, FREET4, T3FREE, THYROIDAB in the last 72 hours. Anemia Panel: No results for input(s): VITAMINB12, FOLATE, FERRITIN, TIBC, IRON, RETICCTPCT in the last 72 hours. Urine analysis:    Component Value Date/Time   COLORURINE YELLOW (A) 02/07/2020 0806   APPEARANCEUR HAZY (A) 02/07/2020 0806   LABSPEC 1.026 02/07/2020 0806   PHURINE 5.0 02/07/2020 0806   GLUCOSEU >=500 (A) 02/07/2020 0806   HGBUR LARGE (A) 02/07/2020 0806   BILIRUBINUR NEGATIVE 02/07/2020 0806   KETONESUR 20 (A) 02/07/2020 0806   PROTEINUR 100 (A) 02/07/2020 0806   NITRITE NEGATIVE 02/07/2020 0806   LEUKOCYTESUR NEGATIVE 02/07/2020 0806   Sepsis Labs: @LABRCNTIP (procalcitonin:4,lacticidven:4) ) Recent Results (from the past 240 hour(s))  Respiratory Panel by RT PCR (Flu A&B, Covid) - Nasopharyngeal Swab     Status: None   Collection Time: 02/07/20  2:38 PM   Specimen: Nasopharyngeal Swab  Result Value Ref Range Status   SARS Coronavirus 2 by RT PCR NEGATIVE NEGATIVE Final    Comment: (NOTE) SARS-CoV-2 target nucleic acids are NOT DETECTED. The SARS-CoV-2 RNA is generally detectable in upper respiratoy specimens during the acute phase of infection. The lowest concentration of SARS-CoV-2 viral copies this assay can detect is 131 copies/mL. A negative result does not preclude SARS-Cov-2 infection and should not be used as the sole basis for treatment or other patient management decisions. A negative result may occur with  improper specimen collection/handling, submission of specimen other than  nasopharyngeal swab, presence of viral mutation(s) within the areas targeted by this assay, and inadequate number of viral copies (<131 copies/mL). A negative result must be combined with clinical observations, patient history, and epidemiological information. The expected result is Negative. Fact Sheet for Patients:  PinkCheek.be Fact Sheet for Healthcare Providers:  GravelBags.it This test is not yet ap proved or cleared by the [place] FDA and  has been authorized for detection and/or diagnosis of SARS-CoV-2 by FDA under an Emergency Use Authorization (EUA). This EUA will remain  in effect (meaning this test can be used) for the duration of the COVID-19 declaration under Section 564(b)(1) of the Act, 21 U.S.C. section 360bbb-3(b)(1), unless the authorization is terminated or revoked sooner.    Influenza A by PCR NEGATIVE NEGATIVE Final   Influenza B by PCR NEGATIVE NEGATIVE Final    Comment: (NOTE) The Xpert Xpress SARS-CoV-2/FLU/RSV assay is intended as an aid in  the diagnosis of influenza from Nasopharyngeal swab specimens and  should not be used as a sole basis for treatment. Nasal washings and  aspirates are unacceptable for Xpert Xpress SARS-CoV-2/FLU/RSV  testing. Fact Sheet for Patients: PinkCheek.be Fact Sheet for Healthcare Providers: GravelBags.it This test is not yet approved or cleared by the [place] FDA and  has been authorized for detection and/or diagnosis of SARS-CoV-2 by  FDA under an Emergency Use Authorization (EUA). This EUA will remain  in effect (meaning this test can be used) for the duration of the  Covid-19 declaration under Section 564(b)(1) of the Act, 21  U.S.C. section 360bbb-3(b)(1), unless the authorization is  terminated or revoked. Performed at [facility], [address] [place], [place] [address]       Radiological Exams on Admission: DG Abdomen 1 View  Result Date: 02/07/2020 CLINICAL DATA:  Right flank  pain. EXAM: ABDOMEN - 1 VIEW COMPARISON:  November 27, 2019. FINDINGS: The bowel gas pattern is normal. Bilateral nephrolithiasis is noted. Left ureteral stent is not visualized and has been removed. IMPRESSION: Bilateral nephrolithiasis. Left ureteral stent has been removed. No evidence of bowel obstruction or ileus. Electronically Signed   By: Marijo Conception M.D.   On: 02/07/2020 12:20   CT Renal Stone Study  Result Date: 02/07/2020 CLINICAL DATA:  And flank pain EXAM: CT ABDOMEN AND PELVIS WITHOUT CONTRAST TECHNIQUE: Multidetector CT imaging of the abdomen and pelvis was performed following the standard protocol without oral or IV contrast. COMPARISON:  March 26, 2019 FINDINGS: Lower chest: Lung bases are clear. There are foci of coronary artery calcification. Hepatobiliary: No focal liver lesions are evident on this noncontrast enhanced study. Gallbladder wall is not appreciably thickened. There is no biliary duct dilatation. Pancreas: There is no pancreatic mass or inflammatory focus. Spleen: No splenic lesions are evident. Adrenals/Urinary Tract: Adrenals bilaterally appear normal. Right kidney is subtly edematous. There is a cyst in the mid left kidney measuring 1.2 x 1.0 cm, better seen on prior contrast enhanced study. There is mild hydronephrosis on the right. There is no appreciable hydronephrosis on the left. There is a 3 x 2 mm calculus in the lower pole of the right kidney. There is a 4 x 3 mm calculus in the lower pole of the left kidney. There is a calculus in the proximal right ureter at the L3-4 level measuring 7 x 5 mm. No other ureteral calculi are evident. Urinary bladder is midline with wall thickness within normal limits. Stomach/Bowel: There is no appreciable bowel wall or mesenteric thickening. There is moderate stool in the colon. Note that most small bowel loops are  fluid-filled. There is no demonstrable bowel obstruction. The terminal ileum appears unremarkable. There is no evident free air or portal venous air. Vascular/Lymphatic: There is aortic atherosclerosis. There is calcification in the proximal right renal artery. There is also mild calcification in the left common iliac artery. There is no evident abdominal aortic aneurysm. No adenopathy evident in the abdomen or pelvis. Reproductive: There are a few prostatic calculi. Prostate and seminal vesicles are normal in size and contour. No pelvic mass evident. Other: Appendix appears normal. There is no evident abscess or ascites in the abdomen or pelvis. There is fat in each inguinal ring. Musculoskeletal: No blastic or lytic bone lesions. There is degenerative change in the thoracic spine. No intramuscular lesions are evident. IMPRESSION: 1. 7 x 5 mm calculus in the proximal right ureter at the L3-4 level with mild hydronephrosis and mild right renal edema. 2.  Nonobstructing calculus in the lower pole of each kidney. 3. Fluid throughout most small bowel loops, a finding that may indicate a degree of enteritis or ileus. Ileus secondary to the obstructing calculus on the right is quite possible. No bowel obstruction evident. 4.  No abscess.  Appendix appears normal. 5. Aortic Atherosclerosis (ICD10-I70.0). There is also calcification in the right proximal renal artery as well as foci of coronary artery calcification. Electronically Signed   By: Lowella Grip III M.D.   On: 02/07/2020 08:42     EKG: Independently reviewed.  Sinus rhythm, QTC 427, nonspecific T wave change. The repeated EKG showed T wave inversion in lead III   Assessment/Plan Principal Problem:   NSTEMI (non-ST elevated myocardial infarction) [facility]) Active Problems:   Essential hypertension   Coronary artery disease   Hyperlipidemia LDL goal <70  Right ureteral stone   Diabetes mellitus without complication (HCC)   Chronic systolic CHF  (congestive heart failure) (HCC)   STEMI (non-ST elevated myocardial infarction) (HCC) and hx of CAD: trop 10 -->1460 --[id number]. Dr. Nehemiah Massed of card is consulted  - will admit to tele bed as inpt - IV heparin - Trend Trop - Repeat EKG in the am  - prn Nitroglycerin, Morphine, and aspirin, lipitor, metoprolol - Risk factor stratification: will check FLP and A1C  - check UDS - 2d echo  Right ureteral stone: Dr. Diamantina Providence of urology is consulted -f/u urologist recommendation -As needed morphine and Percocet for pain -As needed Zofran for nausea  Essential hypertension: -prn Hydralazine -Continue home medications: Hydralazine, metoprolol  Hyperlipidemia LDL goal <70: -lipitor  Diabetes mellitus without complication ([place]): Most recent A1c 7.2, poorly controled. Patient is taking Rybelus and Xigduo-XR at home -SSI  Chronic systolic CHF (congestive heart failure) ([place]): 2D echo on 08/24/2018 showed EF of 35-40%.  Patient does not have leg edema.  CHF seem to be compensated. -Watch volume status closely -on ASA and metoprolol    DVT ppx: on IV Heparin   Code Status: Full code Family Communication: None at bed side. Disposition Plan:  Anticipate discharge back to previous home environment Consults called:  Cardiology, Dr. Nehemiah Massed and urology, Dr. Diamantina Providence were consulted.  Admission status: Tele bed as inpt        Date of Service 02/07/2020    [facility]   If 7PM-7AM, please contact night-coverage [email or web address] 02/07/2020, 7:01 PM

## 2020-02-07 NOTE — Consult Note (Signed)
[facility] Indication: chest pain/ACS  Allergies  Allergen Reactions  . Lisinopril Swelling  . Ace Inhibitors Swelling    Patient Measurements: Height: 5\' 11"  (180.3 cm) Weight: 195 lb (88.5 kg) IBW/kg (Calculated) : 75.3 Heparin Dosing Weight: 88.5 kg  Vital Signs: Temp: 97.8 F (36.6 C) (02/12 0753) Temp Source: Oral (02/12 0753) BP: 162/120 (02/12 1230) Pulse Rate: 106 (02/12 1230)  Labs: Recent Labs    02/07/20 0806 02/07/20 0917 02/07/20 1151  HGB 16.6  --   --   HCT 51.0  --   --   PLT 221  --   --   CREATININE 1.01  --   --   TROPONINIHS  --  10 1,460*    Estimated Creatinine Clearance: 97.3 mL/min (by C-G formula based on SCr of 1.01 mg/dL).   Medical History: Past Medical History:  Diagnosis Date  . Angioedema   . Bronchitis   . DDD (degenerative disc disease), cervical   . Diabetes mellitus ([place])   . Fatty liver 03/31/2017   [place] April 2018  . GERD (gastroesophageal reflux disease)   . Gout   . Heart attack ([place])   . HTN (hypertension)   . Hypertension    CONTROLLED ON MEDS  . IBS (irritable bowel syndrome)   . Kidney stone   . LVH (left ventricular hypertrophy) 11/24/2018   Moderate, ECHO  . Myocardial infarction ([place]) 08/23/2018  . Seasonal allergies   . Spinal stenosis     Medications:  (Not in a hospital admission)  Scheduled:  . aspirin  324 mg Oral Once  . LORazepam      . nitroGLYCERIN  1 inch Topical Once   Infusions:  . sodium chloride     PRN: metoprolol tartrate, morphine injection Anti-infectives (From admission, onward)   None      Assessment: Pharmacy consulted to start heparin infusion for ACS. No DOAC PTA.   Goal of Therapy:  Heparin level 0.3-0.7 units/ml Monitor platelets by anticoagulation protocol: Yes   Plan:  Give 4000 units bolus x 1 Start heparin infusion at 1200 units/hr Check anti-Xa level in 6 hours and daily while on heparin Continue to monitor  H&H and platelets  Oswald Hillock, PharmD, BCPS 02/07/2020,1:41 PM

## 2020-02-07 NOTE — ED Notes (Signed)
Pt taken to xray at this time.

## 2020-02-07 NOTE — ED Notes (Signed)
Pt ambulatory to toilet with 1 assist.  

## 2020-02-07 NOTE — Progress Notes (Signed)
   02/07/20 2200  Clinical Encounter Type  Visited With Patient  Visit Type Follow-up;Spiritual support  Referral From Chaplain  Consult/Referral To Chaplain  Chaplain briefly visited patient giving him several scriptures to read.

## 2020-02-07 NOTE — ED Notes (Signed)
Urologist at bedside.

## 2020-02-07 NOTE — Consult Note (Signed)
[facility] Indication: chest pain/ACS  Allergies  Allergen Reactions  . Lisinopril Swelling  . Ace Inhibitors Swelling    Patient Measurements: Height: 5\' 11"  (180.3 cm) Weight: 195 lb (88.5 kg) IBW/kg (Calculated) : 75.3 Heparin Dosing Weight: 88.5 kg  Vital Signs: Temp: 97.9 F (36.6 C) (02/12 1926) Temp Source: Oral (02/12 1926) BP: 167/117 (02/12 1926) Pulse Rate: 101 (02/12 1926)  Labs: Recent Labs    02/07/20 0806 02/07/20 0917 02/07/20 1151 02/07/20 1438 02/07/20 1602 02/07/20 2015  HGB 16.6  --   --   --   --   --   HCT 51.0  --   --   --   --   --   PLT 221  --   --   --   --   --   APTT  --   --   --  102*  --   --   LABPROT  --   --   --  14.0  --   --   INR  --   --   --  1.1  --   --   HEPARINUNFRC  --   --   --   --   --  0.55  CREATININE 1.01  --   --   --   --   --   TROPONINIHS  --    < > 1,460*  --  9,920* 14,856*   < > = values in this interval not displayed.    Estimated Creatinine Clearance: 97.3 mL/min (by C-G formula based on SCr of 1.01 mg/dL).   Medical History: Past Medical History:  Diagnosis Date  . Angioedema   . Bronchitis   . DDD (degenerative disc disease), cervical   . Diabetes mellitus ([place])   . Fatty liver 03/31/2017   [place] April 2018  . GERD (gastroesophageal reflux disease)   . Gout   . Heart attack ([place])   . HTN (hypertension)   . Hypertension    CONTROLLED ON MEDS  . IBS (irritable bowel syndrome)   . Kidney stone   . LVH (left ventricular hypertrophy) 11/24/2018   Moderate, ECHO  . Myocardial infarction ([place]) 08/23/2018  . Seasonal allergies   . Spinal stenosis     Medications:  Medications Prior to Admission  Medication Sig Dispense Refill Last Dose  . allopurinol (ZYLOPRIM) 100 MG tablet Take 100 mg by mouth daily.    02/06/2020 at Unknown time  . aspirin EC 81 MG tablet Take 81 mg by mouth daily.   02/06/2020 at Unknown time  . atorvastatin (LIPITOR) 80 MG  tablet Take 1 tablet (80 mg total) by mouth daily at 6 PM. (Patient taking differently: Take 80 mg by mouth daily with supper. ) 30 tablet 0 02/06/2020 at Unknown time  . cetirizine (ZYRTEC) 10 MG tablet Take 10 mg by mouth at bedtime.   02/06/2020 at Unknown time  . Dapagliflozin-metFORMIN HCl ER (XIGDUO XR) 04-999 MG TB24 Take 2 tablets by mouth daily.    02/06/2020 at Unknown time  . diphenhydrAMINE (BENADRYL) 25 MG tablet Take 50 mg by mouth at bedtime.   02/06/2020 at Unknown time  . hydrALAZINE (APRESOLINE) 25 MG tablet Take 1 tablet (25 mg total) by mouth 2 (two) times daily. 60 tablet 0 02/06/2020 at Unknown time  . Melatonin 5 MG TABS Take 10 mg by mouth at bedtime.    02/06/2020 at Unknown time  . metoprolol tartrate (LOPRESSOR)  25 MG tablet Take 25 mg by mouth 2 (two) times daily.   02/06/2020 at Unknown time  . neomycin-polymyxin b-dexamethasone (MAXITROL) 3.5-10000-0.1 SUSP Place 1 drop into the left eye daily.   02/06/2020 at Unknown time  . pregabalin (LYRICA) 50 MG capsule Take 1 capsule (50 mg total) by mouth 3 (three) times daily. 90 capsule 2 02/06/2020 at Unknown time  . RYBELSUS 3 MG TABS Take 3 mg by mouth daily at 6 (six) AM.    02/06/2020 at Unknown time  . tiZANidine (ZANAFLEX) 4 MG tablet Take 1 tablet (4 mg total) by mouth every 8 (eight) hours as needed for muscle spasms. 90 tablet 5 02/06/2020 at Unknown time  . vitamin B-12 (CYANOCOBALAMIN) 1000 MCG tablet Take 1,000 mcg by mouth 3 (three) times a week.   02/06/2020 at Unknown time  . ACCU-CHEK SOFTCLIX LANCETS lancets USE AS DIRECTED 100 each 0   . BELBUCA 450 MCG FILM Place 1 Film inside cheek 2 (two) times daily. (Patient not taking: Reported on 02/07/2020) 60 each 1 Not Taking at Unknown time  . ezetimibe (ZETIA) 10 MG tablet Take 1 tablet (10 mg total) by mouth daily. (Patient not taking: Reported on 01/06/2020) 30 tablet 11 Not Taking at Unknown time  . ketorolac (TORADOL) 10 MG tablet Take 1 tablet (10 mg total) by mouth every 6  (six) hours as needed. (Patient not taking: Reported on 02/07/2020) 20 tablet 0 Not Taking at Unknown time  . Menthol, Topical Analgesic, (BIOFREEZE EX) Apply 1 application topically 3 (three) times daily as needed (muscle cramps/shoulder pain.).   PRN at PRN  . nitroGLYCERIN (NITROSTAT) 0.4 MG SL tablet Place 1 tablet (0.4 mg total) under the tongue every 5 (five) minutes as needed for chest pain. 30 tablet 0 PRN at PRN  . ONETOUCH VERIO test strip USE 2 (TWO) TIMES DAILY USE AS INSTRUCTED.      Scheduled:  . allopurinol  100 mg Oral Daily  . [START ON 02/08/2020] aspirin EC  81 mg Oral Daily  . atorvastatin  80 mg Oral Q supper  . diphenhydrAMINE  50 mg Oral QHS  . hydrALAZINE  25 mg Oral BID  . insulin aspart  0-5 Units Subcutaneous QHS  . insulin aspart  0-9 Units Subcutaneous TID WC  . ketorolac  15 mg Intravenous Q6H  . loratadine  10 mg Oral Daily  . Melatonin  10 mg Oral QHS  . metoprolol tartrate  25 mg Oral BID  . neomycin-polymyxin b-dexamethasone  1 drop Left Eye Daily  . pregabalin  50 mg Oral TID  . tamsulosin  0.4 mg Oral QPC supper  . vitamin B-12  1,000 mcg Oral Once per day on Mon Wed Fri   Infusions:  . sodium chloride 75 mL/hr at 02/07/20 1940  . heparin 1,200 Units/hr (02/07/20 1402)   PRN: acetaminophen, ALPRAZolam, hydrALAZINE, metoprolol tartrate, morphine injection, nitroGLYCERIN, ondansetron (ZOFRAN) IV, oxyCODONE-acetaminophen, tiZANidine Anti-infectives (From admission, onward)   None      Assessment: Pharmacy consulted to start heparin infusion for ACS. No DOAC PTA.   Goal of Therapy:  Heparin level 0.3-0.7 units/ml Monitor platelets by anticoagulation protocol: Yes   Plan:  Give 4000 units bolus x 1 Start heparin infusion at 1200 units/hr Check anti-Xa level in 6 hours and daily while on heparin Continue to monitor H&H and platelets   2/12: HL @ 2015 = 0.55 Will continue this pt on current rate and recheck HL on 2/13 @ 0200.   Tully Mcinturff  D, PharmD 02/07/2020,9:07 PM

## 2020-02-07 NOTE — ED Notes (Addendum)
Pt arrives to ED with R sided back and flank pain that began this morning. States N&V. Hx kidney stones. Denies urinary symptoms. Denies hematuria. States had to have surgery on L sided kidney stone in November. A&O, answering questions appropriately. Denies having appendix. Appears uncomfortable.

## 2020-02-07 NOTE — ED Provider Notes (Signed)
[facility] Emergency Department Provider Note    First MD Initiated Contact with Patient 02/07/20 [phone]     (approximate)  I have reviewed the triage vital signs and the nursing notes.   HISTORY  Chief Complaint Flank Pain    HPI John Hartman is a 49 y.o. male the below listed past medical history and states he has a history of kidney stones presents to the ER for evaluation of severe right-sided flank pain radiating to his right groin that woke him from sleep.  Is never had pain is severe.  Does have associated nausea and has vomited several times secondary to the pain.  No fevers.  Denies any dysuria or hematuria.  Denies any chest pain or shortness of breath.    Past Medical History:  Diagnosis Date  . Angioedema   . Bronchitis   . DDD (degenerative disc disease), cervical   . Diabetes mellitus ([place])   . Fatty liver 03/31/2017   [place] April 2018  . GERD (gastroesophageal reflux disease)   . Gout   . Heart attack ([place])   . HTN (hypertension)   . Hypertension    CONTROLLED ON MEDS  . IBS (irritable bowel syndrome)   . Kidney stone   . LVH (left ventricular hypertrophy) 11/24/2018   Moderate, ECHO  . Myocardial infarction ([place]) 08/23/2018  . Seasonal allergies   . Spinal stenosis    Family History  Problem Relation Age of Onset  . Breast cancer Mother   . Skin cancer Mother   . Arrhythmia Mother        A-fib  . Cancer Mother        breast, colon?, skin  . Hypertension Father   . Diabetes Father   . Cancer Maternal Grandfather   . Heart disease Brother        stent  . Allergies Son   . Alzheimer's disease Maternal Grandmother   . Stroke Neg Hx   . COPD Neg Hx    Past Surgical History:  Procedure Laterality Date  . ACDF with fusion  2007  . CARDIAC CATHETERIZATION Left 08/15/2016   Procedure: Left Heart Cath and Coronary Angiography;  Surgeon: Wellington Hampshire, MD;  Location: [facility];  Service: Cardiovascular;  Laterality:  Left;  . COLONOSCOPY WITH PROPOFOL N/A 06/26/2015   Procedure: COLONOSCOPY WITH PROPOFOL;  Surgeon: Lucilla Lame, MD;  Location: [facility];  Service: Endoscopy;  Laterality: N/A;  with biopsies  . CORONARY/GRAFT ACUTE MI REVASCULARIZATION N/A 08/23/2018   Procedure: Coronary/Graft Acute MI Revascularization;  Surgeon: Isaias Cowman, MD;  Location: [facility];  Service: Cardiovascular;  Laterality: N/A;  . CYSTOSCOPY WITH BIOPSY N/A 11/22/2019   Procedure: CYSTOSCOPY WITH Bladder BIOPSY & [place]myra Free;  Surgeon: Billey Co, MD;  Location: [facility];  Service: Urology;  Laterality: N/A;  . CYSTOSCOPY/URETEROSCOPY/HOLMIUM LASER/STENT PLACEMENT Left 11/22/2019   Procedure: CYSTOSCOPY/URETEROSCOPY/BILATERAL RETROGRADE PYELOGRAM/HOLMIUM LASER/STENT PLACEMENT;  Surgeon: Billey Co, MD;  Location: [facility];  Service: Urology;  Laterality: Left;  . LEFT HEART CATH AND CORONARY ANGIOGRAPHY N/A 08/23/2018   Procedure: LEFT HEART CATH AND CORONARY ANGIOGRAPHY;  Surgeon: Isaias Cowman, MD;  Location: [facility];  Service: Cardiovascular;  Laterality: N/A;  . NASAL SEPTUM SURGERY  2004  . SHOULDER ARTHROSCOPY W/ ROTATOR CUFF REPAIR  10/01/2019  . SHOULDER SURGERY Left 2007  . Testicular torsion  1980s  . VASECTOMY     Patient Active Problem List   Diagnosis Date  Noted  . 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 Arthritis of right acromioclavicular joint 09/23/2019  . Biceps tendonitis, unspecified laterality 09/23/2019  . Bursitis of right shoulder 09/23/2019  . Incomplete tear of right rotator cuff 09/23/2019  . 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 Tachycardia 03/24/2019  . Status post myocardial infarction 11/24/2018  . LVH (left ventricular hypertrophy) 11/24/2018  . 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 History of angioedema 09/01/2018  . NSTEMI (non-ST elevated myocardial infarction) ([place]) 08/22/2018  . Chronic  lower back pain 08/04/2017  . Degenerative disc disease, lumbar 08/04/2017  . 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 Calcification of abdominal aorta (H[facility]) 08/04/2017  . Fatty liver 03/31/2017  . Elevated liver enzymes 02/11/2017  . Serum total bilirubin elevated 02/11/2017  . Hyperlipidemia LDL goal <70 01/04/2017  . Elevated serum glutamic pyruvic transaminase (SGPT) level 01/04/2017  . Angioedema   . Encounter for medication monitoring 08/25/2016  . Coronary artery disease 08/25/2016  . Uncontrolled diabetes mellitus with hyperglycemia ([place])   . Chest pain 08/08/2016  . Preventative health care 08/02/2016  . Ketonuria 07/12/2016  . Glucosuria 07/12/2016  . 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 Palpitations 08/12/2015  . Family history of malignant neoplasm of gastrointestinal tract   . Benign neoplasm of rectosigmoid junction   . Essential hypertension 05/28/2015  . Gout 05/28/2015  . IBS (irritable bowel syndrome) 05/28/2015      Prior to Admission medications   Medication Sig Start Date End Date Taking? Authorizing Provider  allopurinol (ZYLOPRIM) 100 MG tablet Take 100 mg by mouth daily.    Yes [provider]  aspirin EC 81 MG tablet Take 81 mg by mouth daily.   Yes [provider]  atorvastatin (LIPITOR) 80 MG tablet Take 1 tablet (80 mg total) by mouth daily at 6 PM. Patient taking differently: Take 80 mg by mouth daily with supper.  08/25/18 11/19/20 Yes Pyreddy, Reatha Harps, MD  cetirizine (ZYRTEC) 10 MG tablet Take 10 mg by mouth at bedtime.   Yes [provider]  Dapagliflozin-metFORMIN HCl ER (XIGDUO XR) 04-999 MG TB24 Take 2 tablets by mouth daily.    Yes [provider]  diphenhydrAMINE (BENADRYL) 25 MG tablet Take 50 mg by mouth at bedtime.   Yes [provider]  hydrALAZINE (APRESOLINE) 25 MG tablet Take 1 tablet (25 mg total) by mouth 2 (two) times daily. 08/25/18 11/19/20 Yes Pyreddy, Reatha Harps, MD  Melatonin 5 MG TABS Take 10 mg by  mouth at bedtime.    Yes [provider]  metoprolol tartrate (LOPRESSOR) 25 MG tablet Take 25 mg by mouth 2 (two) times daily. 08/22/19  Yes [provider]  neomycin-polymyxin b-dexamethasone (MAXITROL) 3.5-10000-0.1 SUSP Place 1 drop into the left eye daily. 01/27/20  Yes [provider]  pregabalin (LYRICA) 50 MG capsule Take 1 capsule (50 mg total) by mouth 3 (three) times daily. 01/06/20  Yes Tapia, Leisa, PA-C  RYBELSUS 3 MG TABS Take 3 mg by mouth daily at 6 (six) AM.    Yes [provider]  tiZANidine (ZANAFLEX) 4 MG tablet Take 1 tablet (4 mg total) by mouth every 8 (eight) hours as needed for muscle spasms. 07/17/19  Yes Gillis Santa, MD  vitamin B-12 (CYANOCOBALAMIN) 1000 MCG tablet Take 1,000 mcg by mouth 3 (three) times a week.   Yes [provider]  ACCU-CHEK SOFTCLIX LANCETS lancets USE AS DIRECTED 07/23/16   Lada, Satira Anis, MD  BELBUCA 450 MCG FILM Place 1 Film inside cheek  2 (two) times daily. Patient not taking: Reported on 02/07/2020 07/17/19   Gillis Santa, MD  ezetimibe (ZETIA) 10 MG tablet Take 1 tablet (10 mg total) by mouth daily. Patient not taking: Reported on 01/06/2020 12/03/18   Arnetha Courser, MD  ketorolac (TORADOL) 10 MG tablet Take 1 tablet (10 mg total) by mouth every 6 (six) hours as needed. Patient not taking: Reported on 02/07/2020 11/24/19   Blake Divine, MD  Menthol, Topical Analgesic, (BIOFREEZE EX) Apply 1 application topically 3 (three) times daily as needed (muscle cramps/shoulder pain.).    [provider]  nitroGLYCERIN (NITROSTAT) 0.4 MG SL tablet Place 1 tablet (0.4 mg total) under the tongue every 5 (five) minutes as needed for chest pain. 08/25/18   Saundra Shelling, MD  [facility] VERIO test strip USE 2 (TWO) TIMES DAILY USE AS INSTRUCTED. 09/16/19   [provider]    Allergies Lisinopril and Ace inhibitors    Social History Social History   Tobacco Use  . Smoking status: Former Smoker     Packs/day: 1.00    Years: 10.00    Pack years: 10.00    Types: Cigarettes    Quit date: 04/15/2015    Years since quitting: 4.8  . Smokeless tobacco: Never Used  Substance Use Topics  . Alcohol use: No    Alcohol/week: 0.0 standard drinks  . Drug use: No    Review of Systems Patient denies headaches, rhinorrhea, blurry vision, numbness, shortness of breath, chest pain, edema, cough, abdominal pain, nausea, vomiting, diarrhea, dysuria, fevers, rashes or hallucinations unless otherwise stated above in HPI. ____________________________________________   PHYSICAL EXAM:  VITAL SIGNS: Vitals:   02/07/20 1145 02/07/20 1230  BP: (!) 163/126 (!) 162/120  Pulse: (!) 102 (!) 106  Resp:    Temp:    SpO2: 95% 94%    Constitutional: Alert and oriented.  Eyes: Conjunctivae are normal.  Head: Atraumatic. Nose: No congestion/rhinnorhea. Mouth/Throat: Mucous membranes are moist.   Neck: No stridor. Painless ROM.  Cardiovascular: Normal rate, regular rhythm. Grossly normal heart sounds.  Good peripheral circulation. Respiratory: Normal respiratory effort.  No retractions. Lungs CTAB. Gastrointestinal: Soft and nontender. No distention. No abdominal bruits. + right CVA tenderness. Genitourinary:  Musculoskeletal: No lower extremity tenderness nor edema.  No joint effusions. Neurologic:  Normal speech and language. No gross focal neurologic deficits are appreciated. No facial droop Skin:  Skin is warm, dry and intact. No rash noted. Psychiatric: Mood and affect are normal. Speech and behavior are normal.  ____________________________________________   LABS (all labs ordered are listed, but only abnormal results are displayed)  Results for orders placed or performed during the hospital encounter of 02/07/20 (from the past 24 hour(s))  Urinalysis, Complete w Microscopic     Status: Abnormal   Collection Time: 02/07/20  8:06 AM  Result Value Ref Range   Color, Urine YELLOW (A) YELLOW    APPearance HAZY (A) CLEAR   Specific Gravity, Urine 1.026 1.005 - 1.030   pH 5.0 5.0 - 8.0   Glucose, UA >=500 (A) NEGATIVE mg/dL   Hgb urine dipstick LARGE (A) NEGATIVE   Bilirubin Urine NEGATIVE NEGATIVE   Ketones, ur 20 (A) NEGATIVE mg/dL   Protein, ur 100 (A) NEGATIVE mg/dL   Nitrite NEGATIVE NEGATIVE   Leukocytes,Ua NEGATIVE NEGATIVE   RBC / HPF >50 (H) 0 - 5 RBC/hpf   WBC, UA 11-20 0 - 5 WBC/hpf   Bacteria, UA NONE SEEN NONE SEEN   Squamous Epithelial / LPF  NONE SEEN 0 - 5   Mucus PRESENT   Basic metabolic panel     Status: Abnormal   Collection Time: 02/07/20  8:06 AM  Result Value Ref Range   Sodium 139 135 - 145 mmol/L   Potassium 4.0 3.5 - 5.1 mmol/L   Chloride 103 98 - 111 mmol/L   CO2 20 (L) 22 - 32 mmol/L   Glucose, Bld 190 (H) 70 - 99 mg/dL   BUN 17 6 - 20 mg/dL   Creatinine, Ser 1.01 0.61 - 1.24 mg/dL   Calcium 9.8 8.9 - 10.3 mg/dL   GFR calc non Af Amer >60 >60 mL/min   GFR calc Af Amer >60 >60 mL/min   Anion gap 16 (H) 5 - 15  CBC     Status: Abnormal   Collection Time: 02/07/20  8:06 AM  Result Value Ref Range   WBC 9.5 4.0 - 10.5 K/uL   RBC 6.33 (H) 4.22 - 5.81 MIL/uL   Hemoglobin 16.6 13.0 - 17.0 g/dL   HCT 51.0 39.0 - 52.0 %   MCV 80.6 80.0 - 100.0 fL   MCH 26.2 26.0 - 34.0 pg   MCHC 32.5 30.0 - 36.0 g/dL   RDW 14.1 11.5 - 15.5 %   Platelets 221 150 - 400 K/uL   nRBC 0.0 0.0 - 0.2 %  Troponin I (High Sensitivity)     Status: None   Collection Time: 02/07/20  9:17 AM  Result Value Ref Range   Troponin I (High Sensitivity) 10 <18 ng/L  Troponin I (High Sensitivity)     Status: Abnormal   Collection Time: 02/07/20 11:51 AM  Result Value Ref Range   Troponin I (High Sensitivity) 1,460 (HH) <18 ng/L   ____________________________________________  EKG My review and personal interpretation at Time: 9:07   Indication: chest pain  Rate: 85  Rhythm: sinus Axis: normal Other: normal intervals, nonsepcific st changes   My review and personal  interpretation at Time: 11:13 Indication: chest pain  Rate: 105  Rhythm: sinus Axis: normal Other: normal intervals, unchanged from previous ecg  ____________________________________________  RADIOLOGY  I personally reviewed all radiographic images ordered to evaluate for the above acute complaints and reviewed radiology reports and findings.  These findings were personally discussed with the patient.  Please see medical record for radiology report.  ____________________________________________   PROCEDURES  Procedure(s) performed:  .Critical Care Performed by: Merlyn Lot, MD Authorized by: Merlyn Lot, MD   Critical care provider statement:    Critical care time (minutes):  35   Critical care was necessary to treat or prevent imminent or life-threatening deterioration of the following conditions:  Cardiac failure   Critical care was time spent personally by me on the following activities:  Discussions with consultants, evaluation of patient's response to treatment, examination of patient, ordering and performing treatments and interventions, ordering and review of laboratory studies, ordering and review of radiographic studies, pulse oximetry, re-evaluation of patient's condition, obtaining history from patient or surrogate and review of old charts      Critical Care performed: yes ____________________________________________   INITIAL IMPRESSION / [place] / ED COURSE  Pertinent labs & imaging results that were available during my care of the patient were reviewed by me and considered in my medical decision making (see chart for details).   DDX: stone, pyelo, colitis, appendicitis, hernia, msk strain,   Derringer Newcome is a 49 y.o. who presents to the ED with symptoms as described above.  Patient  having severe right flank pain.  Unable to sit still associated nausea.  I have a high suspicion he has a kidney stone.  Will give IV pain medication.  Also very  anxious and hypertensive.  The patient will be placed on continuous pulse oximetry and telemetry for monitoring.  Laboratory evaluation will be sent to evaluate for the above complaints.     Clinical Course as of Feb 06 1315  Fri Feb 07, 2020  0905 Patient now complaining of chest discomfort.  Suspect likely secondary to hypertension anxiety and pain related to his renal stone.  Will give additional pain medication will continue observe.  Will add on troponin as well as EKG monitor patient.   [PR]  [phone] Patient feels more relaxed.  States he did not take his antihypertensive medications this morning which include hydralazine and metoprolol.  Feels improved.  Will give AM dose.   [PR]  [id number] Patient still having moderate discomfort.  Discussed case with Dr. Caprice Beaver who is very familiar with this patient.  Unfortunately has not been able to tolerate ureteral stents in the past.  Fortunately his urine does not appear infected.  We will try to achieve better pain control and observe the patient.   [PR]  1231 Patient no longer having any chest pain.  Appears more comfortable but still having some discomfort.  Will trial IV lidocaine.   [PR]  [id number] Patient's repeat troponin came back elevated.  He is currently denying any chest pain or pressure.  Was having significant hypertension related to his pain related diagnosed kidney stone.  Suspect significant demand ischemia.  Will discuss in consultation with cardiology.   [PR]  1306 WBC, UA: 11-20 [PR]  1310 Discussed case with Dr. Nehemiah Massed of cardiology.  Agrees with plan for heparinization and hospitalization agrees with increasing beta-blockers.  Patient remains chest pain free and repeat ekg without STEMI criteria.  Will consult hospitalist.   [PR]    Clinical Course User Index [PR] Merlyn Lot, MD    The patient was evaluated in Emergency Department today for the symptoms described in the history of present illness. He/she was evaluated in the  context of the global COVID-19 pandemic, which necessitated consideration that the patient might be at risk for infection with the SARS-CoV-2 virus that causes COVID-19. Institutional protocols and algorithms that pertain to the evaluation of patients at risk for COVID-19 are in a state of rapid change based on information released by regulatory bodies including the CDC and federal and state organizations. These policies and algorithms were followed during the patient's care in the ED.  As part of my medical decision making, I reviewed the following data within the [place] notes reviewed and incorporated, Labs reviewed, notes from prior ED visits and [place] Controlled Substance Database   ____________________________________________   FINAL CLINICAL IMPRESSION(S) / ED DIAGNOSES  Final diagnoses:  Right flank pain  Kidney stone  Chest pain, unspecified type      NEW MEDICATIONS STARTED DURING THIS VISIT:  New Prescriptions   No medications on file     Note:  This document was prepared using Dragon voice recognition software and may include unintentional dictation errors.    Merlyn Lot, MD 02/07/20 1316

## 2020-02-07 NOTE — ED Notes (Signed)
Date and time results received: 02/07/20 1248   Test: troponin Critical Value: 1460  Name of Provider Notified: Dr. Quentin Cornwall  Orders Received? Or Actions Taken?: nitro paste ordered, consulting cardiology

## 2020-02-08 ENCOUNTER — Inpatient Hospital Stay
Admit: 2020-02-08 | Discharge: 2020-02-08 | Disposition: A | Discharge status: Home / Self Care | Payer: No Typology Code available for payment source | Attending: Internal Medicine | Admitting: Internal Medicine

## 2020-02-08 ENCOUNTER — Encounter
Admission: EM | Disposition: A | Discharge status: Home / Self Care | Payer: Self-pay | Source: Home / Self Care | Attending: Internal Medicine

## 2020-02-08 ENCOUNTER — Encounter: Payer: Self-pay | Admitting: Internal Medicine

## 2020-02-08 DIAGNOSIS — I5022 Chronic systolic (congestive) heart failure: Secondary | ICD-10-CM

## 2020-02-08 DIAGNOSIS — N201 Calculus of ureter: Secondary | ICD-10-CM

## 2020-02-08 DIAGNOSIS — I10 Essential (primary) hypertension: Secondary | ICD-10-CM

## 2020-02-08 DIAGNOSIS — I214 Non-ST elevation (NSTEMI) myocardial infarction: Secondary | ICD-10-CM

## 2020-02-08 LAB — TROPONIN I (HIGH SENSITIVITY): Troponin I (High Sensitivity): 7799 ng/L (ref <18)

## 2020-02-08 LAB — LIPID PANEL
Cholesterol: 145 mg/dL (ref 0–200)
HDL: 33 mg/dL — ABNORMAL LOW (ref >40)
LDL Cholesterol: 95 mg/dL (ref 0–99)
Total CHOL/HDL Ratio: 4.4 RATIO
Triglycerides: 84 mg/dL (ref <150)
VLDL: 17 mg/dL (ref 0–40)

## 2020-02-08 LAB — CBC
HCT: 48.8 % (ref 39.0–52.0)
Hemoglobin: 15.8 g/dL (ref 13.0–17.0)
MCH: 26 pg (ref 26.0–34.0)
MCHC: 32.4 g/dL (ref 30.0–36.0)
MCV: 80.4 fL (ref 80.0–100.0)
Platelets: 174 10*3/uL (ref 150–400)
RBC: 6.07 MIL/uL — ABNORMAL HIGH (ref 4.22–5.81)
RDW: 14.4 % (ref 11.5–15.5)
WBC: 11 10*3/uL — ABNORMAL HIGH (ref 4.0–10.5)
nRBC: 0 % (ref 0.0–0.2)

## 2020-02-08 LAB — HEMOGLOBIN A1C
Hgb A1c MFr Bld: 6.4 % — ABNORMAL HIGH (ref 4.8–5.6)
Mean Plasma Glucose: 136.98 mg/dL

## 2020-02-08 LAB — GLUCOSE, CAPILLARY
Glucose-Capillary: 129 mg/dL — ABNORMAL HIGH (ref 70–99)
Glucose-Capillary: 153 mg/dL — ABNORMAL HIGH (ref 70–99)
Glucose-Capillary: 130 mg/dL — ABNORMAL HIGH (ref 70–99)
Glucose-Capillary: 137 mg/dL — ABNORMAL HIGH (ref 70–99)

## 2020-02-08 LAB — HEPARIN LEVEL (UNFRACTIONATED): Heparin Unfractionated: 0.47 IU/mL (ref 0.30–0.70)

## 2020-02-08 SURGERY — CYSTOSCOPY/URETEROSCOPY/HOLMIUM LASER/STENT PLACEMENT
Anesthesia: Choice | Laterality: Right

## 2020-02-08 MED ORDER — AMLODIPINE BESYLATE 10 MG PO TABS
10.0000 mg | ORAL_TABLET | Freq: Every day | ORAL | Status: DC
  Administered 2020-02-09 – 2020-02-12 (×4): 10 mg via ORAL
  Filled 2020-02-08 (×4): qty 1

## 2020-02-08 MED ORDER — BUTALBITAL-APAP-CAFFEINE 50-325-40 MG PO TABS
1.0000 | ORAL_TABLET | Freq: Four times a day (QID) | ORAL | Status: DC | PRN
  Administered 2020-02-08 – 2020-02-11 (×3): 1 via ORAL
  Filled 2020-02-08 (×3): qty 1

## 2020-02-08 MED ORDER — AMLODIPINE BESYLATE 5 MG PO TABS
5.0000 mg | ORAL_TABLET | Freq: Every day | ORAL | Status: DC
  Administered 2020-02-08: 5 mg via ORAL
  Filled 2020-02-08: qty 1

## 2020-02-08 MED ORDER — SODIUM CHLORIDE 0.9% FLUSH
3.0000 mL | Freq: Two times a day (BID) | INTRAVENOUS | Status: DC
  Administered 2020-02-08 – 2020-02-12 (×6): 3 mL via INTRAVENOUS

## 2020-02-08 MED ORDER — HYDROMORPHONE HCL 1 MG/ML IJ SOLN
0.5000 mg | INTRAMUSCULAR | Status: DC | PRN
  Administered 2020-02-08 – 2020-02-09 (×4): 0.5 mg via INTRAVENOUS
  Filled 2020-02-08 (×4): qty 1

## 2020-02-08 SURGICAL SUPPLY — 29 items
BAG DRAIN CYSTO-URO LG1000N (MISCELLANEOUS) ×2 IMPLANT
BRUSH SCRUB EZ 1% IODOPHOR (MISCELLANEOUS) ×2 IMPLANT
CATH URETL 5X70 OPEN END (CATHETERS) IMPLANT
CNTNR SPEC 2.5X3XGRAD LEK (MISCELLANEOUS)
CONT SPEC 4OZ STER OR WHT (MISCELLANEOUS)
CONTAINER SPEC 2.5X3XGRAD LEK (MISCELLANEOUS) IMPLANT
DRAPE UTILITY 15X26 TOWEL STRL (DRAPES) ×2 IMPLANT
FIBER LASER TRAC TIP (UROLOGICAL SUPPLIES) IMPLANT
GLOVE BIOGEL PI IND STRL 7.5 (GLOVE) ×1 IMPLANT
GLOVE BIOGEL PI INDICATOR 7.5 (GLOVE) ×1
GOWN STRL REUS W/ TWL LRG LVL3 (GOWN DISPOSABLE) ×1 IMPLANT
GOWN STRL REUS W/ TWL XL LVL3 (GOWN DISPOSABLE) ×1 IMPLANT
GOWN STRL REUS W/TWL LRG LVL3 (GOWN DISPOSABLE) ×1
GOWN STRL REUS W/TWL XL LVL3 (GOWN DISPOSABLE) ×1
GUIDEWIRE STR DUAL SENSOR (WIRE) ×2 IMPLANT
INFUSOR MANOMETER BAG 3000ML (MISCELLANEOUS) ×2 IMPLANT
INTRODUCER DILATOR DOUBLE (INTRODUCER) IMPLANT
KIT TURNOVER CYSTO (KITS) ×2 IMPLANT
PACK CYSTO AR (MISCELLANEOUS) ×2 IMPLANT
SET CYSTO W/LG BORE CLAMP LF (SET/KITS/TRAYS/PACK) ×2 IMPLANT
SHEATH URETERAL 12FRX35CM (MISCELLANEOUS) IMPLANT
SOL .9 NS 3000ML IRR  AL (IV SOLUTION) ×1
SOL .9 NS 3000ML IRR UROMATIC (IV SOLUTION) ×1 IMPLANT
STENT URET 6FRX24 CONTOUR (STENTS) IMPLANT
STENT URET 6FRX26 CONTOUR (STENTS) IMPLANT
SURGILUBE 2OZ TUBE FLIPTOP (MISCELLANEOUS) ×2 IMPLANT
SYR 10ML LL (SYRINGE) ×2 IMPLANT
VALVE UROSEAL ADJ ENDO (VALVE) IMPLANT
WATER STERILE IRR 1000ML POUR (IV SOLUTION) ×2 IMPLANT

## 2020-02-08 NOTE — Consult Note (Signed)
[facility] for [facility] Indication: chest pain/ACS  Allergies  Allergen Reactions  . Lisinopril Swelling  . Ace Inhibitors Swelling    Patient Measurements: Height: 5\' 11"  (180.3 cm) Weight: 195 lb (88.5 kg) IBW/kg (Calculated) : 75.3 Heparin Dosing Weight: 88.5 kg  Vital Signs: Temp: 97.9 F (36.6 C) (02/12 1926) Temp Source: Oral (02/12 1926) BP: 167/117 (02/12 1926) Pulse Rate: 101 (02/12 1926)  Labs: Recent Labs    02/07/20 0806 02/07/20 0917 02/07/20 1151 02/07/20 1438 02/07/20 1602 02/07/20 2015 02/07/20 2146 02/08/20 0207  HGB 16.6  --   --   --   --   --   --  15.8  HCT 51.0  --   --   --   --   --   --  48.8  PLT 221  --   --   --   --   --   --  174  APTT  --   --   --  102*  --   --   --   --   LABPROT  --   --   --  14.0  --   --   --   --   INR  --   --   --  1.1  --   --   --   --   HEPARINUNFRC  --   --   --   --   --  0.55  --  0.47  CREATININE 1.01  --   --   --   --   --   --   --   TROPONINIHS  --    < >   < >  --  9,920* 14,856* 17,310*  --    < > = values in this interval not displayed.    Estimated Creatinine Clearance: 97.3 mL/min (by C-G formula based on SCr of 1.01 mg/dL).   Medical History: Past Medical History:  Diagnosis Date  . Angioedema   . Bronchitis   . DDD (degenerative disc disease), cervical   . Diabetes mellitus ([place])   . Fatty liver 03/31/2017   [place] April 2018  . GERD (gastroesophageal reflux disease)   . Gout   . Heart attack ([place])   . HTN (hypertension)   . Hypertension    CONTROLLED ON MEDS  . IBS (irritable bowel syndrome)   . Kidney stone   . LVH (left ventricular hypertrophy) 11/24/2018   Moderate, ECHO  . Myocardial infarction ([place]) 08/23/2018  . Seasonal allergies   . Spinal stenosis     Medications:  Medications Prior to Admission  Medication Sig Dispense Refill Last Dose  . allopurinol (ZYLOPRIM) 100 MG tablet Take 100 mg by mouth daily.    02/06/2020 at Unknown  time  . aspirin EC 81 MG tablet Take 81 mg by mouth daily.   02/06/2020 at Unknown time  . atorvastatin (LIPITOR) 80 MG tablet Take 1 tablet (80 mg total) by mouth daily at 6 PM. (Patient taking differently: Take 80 mg by mouth daily with supper. ) 30 tablet 0 02/06/2020 at Unknown time  . cetirizine (ZYRTEC) 10 MG tablet Take 10 mg by mouth at bedtime.   02/06/2020 at Unknown time  . Dapagliflozin-metFORMIN HCl ER (XIGDUO XR) 04-999 MG TB24 Take 2 tablets by mouth daily.    02/06/2020 at Unknown time  . diphenhydrAMINE (BENADRYL) 25 MG tablet Take 50 mg by mouth at bedtime.   02/06/2020 at Unknown time  .  hydrALAZINE (APRESOLINE) 25 MG tablet Take 1 tablet (25 mg total) by mouth 2 (two) times daily. 60 tablet 0 02/06/2020 at Unknown time  . Melatonin 5 MG TABS Take 10 mg by mouth at bedtime.    02/06/2020 at Unknown time  . metoprolol tartrate (LOPRESSOR) 25 MG tablet Take 25 mg by mouth 2 (two) times daily.   02/06/2020 at Unknown time  . neomycin-polymyxin b-dexamethasone (MAXITROL) 3.5-10000-0.1 SUSP Place 1 drop into the left eye daily.   02/06/2020 at Unknown time  . pregabalin (LYRICA) 50 MG capsule Take 1 capsule (50 mg total) by mouth 3 (three) times daily. 90 capsule 2 02/06/2020 at Unknown time  . RYBELSUS 3 MG TABS Take 3 mg by mouth daily at 6 (six) AM.    02/06/2020 at Unknown time  . tiZANidine (ZANAFLEX) 4 MG tablet Take 1 tablet (4 mg total) by mouth every 8 (eight) hours as needed for muscle spasms. 90 tablet 5 02/06/2020 at Unknown time  . vitamin B-12 (CYANOCOBALAMIN) 1000 MCG tablet Take 1,000 mcg by mouth 3 (three) times a week.   02/06/2020 at Unknown time  . ACCU-CHEK SOFTCLIX LANCETS lancets USE AS DIRECTED 100 each 0   . BELBUCA 450 MCG FILM Place 1 Film inside cheek 2 (two) times daily. (Patient not taking: Reported on 02/07/2020) 60 each 1 Not Taking at Unknown time  . ezetimibe (ZETIA) 10 MG tablet Take 1 tablet (10 mg total) by mouth daily. (Patient not taking: Reported on 01/06/2020)  30 tablet 11 Not Taking at Unknown time  . ketorolac (TORADOL) 10 MG tablet Take 1 tablet (10 mg total) by mouth every 6 (six) hours as needed. (Patient not taking: Reported on 02/07/2020) 20 tablet 0 Not Taking at Unknown time  . Menthol, Topical Analgesic, (BIOFREEZE EX) Apply 1 application topically 3 (three) times daily as needed (muscle cramps/shoulder pain.).   PRN at PRN  . nitroGLYCERIN (NITROSTAT) 0.4 MG SL tablet Place 1 tablet (0.4 mg total) under the tongue every 5 (five) minutes as needed for chest pain. 30 tablet 0 PRN at PRN  . ONETOUCH VERIO test strip USE 2 (TWO) TIMES DAILY USE AS INSTRUCTED.      Scheduled:  . allopurinol  100 mg Oral Daily  . aspirin EC  81 mg Oral Daily  . atorvastatin  80 mg Oral Q supper  . diphenhydrAMINE  50 mg Oral QHS  . hydrALAZINE  25 mg Oral BID  . insulin aspart  0-5 Units Subcutaneous QHS  . insulin aspart  0-9 Units Subcutaneous TID WC  . ketorolac  15 mg Intravenous Q6H  . loratadine  10 mg Oral Daily  . Melatonin  10 mg Oral QHS  . metoprolol tartrate  25 mg Oral BID  . neomycin-polymyxin b-dexamethasone  1 drop Left Eye Daily  . pregabalin  50 mg Oral TID  . tamsulosin  0.4 mg Oral QPC supper  . vitamin B-12  1,000 mcg Oral Once per day on Mon Wed Fri   Infusions:  . sodium chloride 75 mL/hr at 02/07/20 1940  . heparin 1,200 Units/hr (02/07/20 1402)   PRN: acetaminophen, ALPRAZolam, hydrALAZINE, metoprolol tartrate, morphine injection, nitroGLYCERIN, ondansetron (ZOFRAN) IV, oxyCODONE-acetaminophen, tiZANidine Anti-infectives (From admission, onward)   None      Assessment: Pharmacy consulted to start heparin infusion for ACS. No DOAC PTA.   Goal of Therapy:  Heparin level 0.3-0.7 units/ml Monitor platelets by anticoagulation protocol: Yes   Plan:  02/13 @ 0200 HL 0.47 therapeutic. Will continue  current rate and will recheck HL w/ am labs, CBC stable will continue to monitor.  Tobie Lords, PharmD 02/08/2020,4:14 AM

## 2020-02-08 NOTE — Progress Notes (Signed)
   02/08/20 1200  Clinical Encounter Type  Visited With Patient and family together  Visit Type Follow-up;Spiritual support  Referral From Chaplain  Consult/Referral To Chaplain  Spiritual Encounters  Spiritual Needs Prayer;Emotional  Stress Factors  Patient Stress Factors Health changes   This is a FLU from previous chaplain. During this visit, patient seemed to be in better spirits. Patient stated that he received good news from the previous night and that the prayer and the prayer shawl worked. Patient also stated that he has to wait until Monday to figure out what is going on which he did not like. Chaplain offered prayer for patient.  End of Visit  No Further

## 2020-02-08 NOTE — Anesthesia Preprocedure Evaluation (Deleted)
Anesthesia Evaluation  Patient identified by MRN, date of birth, ID band Patient awake    Reviewed: Allergy & Precautions, H&P , NPO status , Patient's Chart, lab work & pertinent test results  History of Anesthesia Complications Negative for: history of anesthetic complications  Airway Mallampati: II  TM Distance: >3 FB Neck ROM: full    Dental  (+) Chipped   Pulmonary sleep apnea , former smoker,           Cardiovascular Exercise Tolerance: Good hypertension, (-) angina+ CAD, + Past MI and + Cardiac Stents  (-) DOE      Neuro/Psych negative neurological ROS  negative psych ROS   GI/Hepatic Neg liver ROS, GERD  Controlled,  Endo/Other  diabetes, Type 2  Renal/GU Renal disease     Musculoskeletal  (+) Arthritis ,   Abdominal   Peds  Hematology negative hematology ROS (+)   Anesthesia Other Findings Past Medical History: No date: Angioedema No date: Bronchitis No date: DDD (degenerative disc disease), cervical No date: Diabetes mellitus ([place]) 03/31/2017: Fatty liver     Comment:  [place] April 2018 No date: GERD (gastroesophageal reflux disease) No date: Gout No date: Heart attack ([place]) No date: HTN (hypertension) No date: Hypertension     Comment:  CONTROLLED ON MEDS No date: IBS (irritable bowel syndrome) No date: Kidney stone 11/24/2018: LVH (left ventricular hypertrophy)     Comment:  Moderate, ECHO 08/23/2018: Myocardial infarction [facility]) No date: Seasonal allergies No date: Spinal stenosis  Past Surgical History: 2007: ACDF with fusion 08/15/2016: CARDIAC CATHETERIZATION; Left     Comment:  Procedure: Left Heart Cath and Coronary Angiography;                Surgeon: Wellington Hampshire, MD;  Location: [place]               CV LAB;  Service: Cardiovascular;  Laterality: Left; 06/26/2015: COLONOSCOPY WITH PROPOFOL; N/A     Comment:  Procedure: COLONOSCOPY WITH PROPOFOL;  Surgeon: Lucilla Lame, MD;  Location: [place];  Service:               Endoscopy;  Laterality: N/A;  with biopsies 08/23/2018: CORONARY/GRAFT ACUTE MI REVASCULARIZATION; N/A     Comment:  Procedure: Coronary/Graft Acute MI Revascularization;                Surgeon: Isaias Cowman, MD;  Location: [place] CV LAB;  Service: Cardiovascular;  Laterality:               N/A; 08/23/2018: LEFT HEART CATH AND CORONARY ANGIOGRAPHY; N/A     Comment:  Procedure: LEFT HEART CATH AND CORONARY ANGIOGRAPHY;                Surgeon: Isaias Cowman, MD;  Location: [place] CV LAB;  Service: Cardiovascular;  Laterality:               N/A; 2004: NASAL SEPTUM SURGERY 2007: SHOULDER SURGERY; Left 1980s: Testicular torsion No date: VASECTOMY  BMI    Body Mass Index: 27.98 kg/m      Reproductive/Obstetrics negative OB ROS                             Anesthesia Physical  Anesthesia Plan  ASA: III  Anesthesia Plan: General ETT   Post-op Pain Management:    Induction: Intravenous  PONV Risk Score and Plan: Ondansetron, Dexamethasone, Midazolam and Treatment may vary due to age or medical condition  Airway Management Planned: Oral ETT  Additional Equipment:   Intra-op Plan:   Post-operative Plan: Extubation in OR  Informed Consent: I have reviewed the patients History and Physical, chart, labs and discussed the procedure including the risks, benefits and alternatives for the proposed anesthesia with the patient or authorized representative who has indicated his/her understanding and acceptance.     Dental Advisory Given  Plan Discussed with: Anesthesiologist, CRNA and Surgeon  Anesthesia Plan Comments: (Patient consented for risks of anesthesia including but not limited to:  - adverse reactions to medications - damage to teeth, lips or other oral mucosa - sore throat or hoarseness - Damage to heart, brain, lungs or loss  of life  Patient voiced understanding.)        Anesthesia Quick Evaluation

## 2020-02-08 NOTE — Progress Notes (Signed)
[facility] Encounter Note  Patient: John Hartman / Admit Date: 02/07/2020 / Date of Encounter: 02/08/2020, 7:38 AM   Subjective: Patient has had a waxing and waning flank pain from his kidney stones which is relatively well controlled at this time.  Patient also had chest discomfort substernally waxing and waning but not as intense as yesterday.  Currently stable on heparin.  The patient has had elevated troponin trending higher at 1760 consistent with a non-ST elevation myocardial infarction.  Other medication management is improving although patient does have hypertension which may need further intervention  Review of Systems: Positive for: Chest pain flank pain Negative for: Vision change, hearing change, syncope, dizziness, nausea, vomiting,diarrhea, bloody stool, stomach pain, cough, congestion, diaphoresis, urinary frequency, urinary pain,skin lesions, skin rashes Others previously listed  Objective: Telemetry: Normal sinus rhythm Physical Exam: Blood pressure (!) 177/126, pulse 100, temperature 98.6 F (37 C), temperature source Oral, resp. rate 17, height 5\' 11"  (1.803 m), weight 88.5 kg, SpO2 94 %. Body mass index is 27.2 kg/m. General: Well developed, well nourished, in no acute distress. Head: Normocephalic, atraumatic, sclera non-icteric, no xanthomas, nares are without discharge. Neck: No apparent masses Lungs: Normal respirations with no wheezes, no rhonchi, no rales , no crackles   Heart: Regular rate and rhythm, normal S1 S2, no murmur, no rub, no gallop, PMI is normal size and placement, carotid upstroke normal without bruit, jugular venous pressure normal Abdomen: Soft, non-tender, non-distended with normoactive bowel sounds. No hepatosplenomegaly. Abdominal aorta is normal size without bruit Extremities: No edema, no clubbing, no cyanosis, no ulcers,  Peripheral: 2+ radial, 2+ femoral, 2+ dorsal pedal pulses Neuro: Alert and oriented. Moves all  extremities spontaneously. Psych:  Responds to questions appropriately with a normal affect.   Intake/Output Summary (Last 24 hours) at 02/08/2020 0738 Last data filed at 02/08/2020 0400 Gross per 24 hour  Intake 740 ml  Output 2975 ml  Net -2235 ml    Inpatient Medications:  . allopurinol  100 mg Oral Daily  . aspirin EC  81 mg Oral Daily  . atorvastatin  80 mg Oral Q supper  . diphenhydrAMINE  50 mg Oral QHS  . hydrALAZINE  25 mg Oral BID  . insulin aspart  0-5 Units Subcutaneous QHS  . insulin aspart  0-9 Units Subcutaneous TID WC  . ketorolac  15 mg Intravenous Q6H  . loratadine  10 mg Oral Daily  . Melatonin  10 mg Oral QHS  . metoprolol tartrate  25 mg Oral BID  . neomycin-polymyxin b-dexamethasone  1 drop Left Eye Daily  . pregabalin  50 mg Oral TID  . sodium chloride flush  3 mL Intravenous Q12H  . tamsulosin  0.4 mg Oral QPC supper  . vitamin B-12  1,000 mcg Oral Once per day on Mon Wed Fri   Infusions:  . sodium chloride 75 mL/hr at 02/07/20 1940  . heparin 1,200 Units/hr (02/08/20 0621)    Labs: Recent Labs    02/07/20 0806  NA 139  K 4.0  CL 103  CO2 20*  GLUCOSE 190*  BUN 17  CREATININE 1.01  CALCIUM 9.8   No results for input(s): AST, ALT, ALKPHOS, BILITOT, PROT, ALBUMIN in the last 72 hours. Recent Labs    02/07/20 0806 02/08/20 0207  WBC 9.5 11.0*  HGB 16.6 15.8  HCT 51.0 48.8  MCV 80.6 80.4  PLT 221 174   No results for input(s): CKTOTAL, CKMB, TROPONINI in the last 72 hours. Invalid  input(s): POCBNP No results for input(s): HGBA1C in the last 72 hours.   Weights: Filed Weights   02/07/20 0756  Weight: 88.5 kg     Radiology/Studies:  DG Abdomen 1 View  Result Date: 02/07/2020 CLINICAL DATA:  Right flank pain. EXAM: ABDOMEN - 1 VIEW COMPARISON:  November 27, 2019. FINDINGS: The bowel gas pattern is normal. Bilateral nephrolithiasis is noted. Left ureteral stent is not visualized and has been removed. IMPRESSION: Bilateral  nephrolithiasis. Left ureteral stent has been removed. No evidence of bowel obstruction or ileus. Electronically Signed   By: Marijo Conception M.D.   On: 02/07/2020 12:20   CT Renal Stone Study  Result Date: 02/07/2020 CLINICAL DATA:  And flank pain EXAM: CT ABDOMEN AND PELVIS WITHOUT CONTRAST TECHNIQUE: Multidetector CT imaging of the abdomen and pelvis was performed following the standard protocol without oral or IV contrast. COMPARISON:  March 26, 2019 FINDINGS: Lower chest: Lung bases are clear. There are foci of coronary artery calcification. Hepatobiliary: No focal liver lesions are evident on this noncontrast enhanced study. Gallbladder wall is not appreciably thickened. There is no biliary duct dilatation. Pancreas: There is no pancreatic mass or inflammatory focus. Spleen: No splenic lesions are evident. Adrenals/Urinary Tract: Adrenals bilaterally appear normal. Right kidney is subtly edematous. There is a cyst in the mid left kidney measuring 1.2 x 1.0 cm, better seen on prior contrast enhanced study. There is mild hydronephrosis on the right. There is no appreciable hydronephrosis on the left. There is a 3 x 2 mm calculus in the lower pole of the right kidney. There is a 4 x 3 mm calculus in the lower pole of the left kidney. There is a calculus in the proximal right ureter at the L3-4 level measuring 7 x 5 mm. No other ureteral calculi are evident. Urinary bladder is midline with wall thickness within normal limits. Stomach/Bowel: There is no appreciable bowel wall or mesenteric thickening. There is moderate stool in the colon. Note that most small bowel loops are fluid-filled. There is no demonstrable bowel obstruction. The terminal ileum appears unremarkable. There is no evident free air or portal venous air. Vascular/Lymphatic: There is aortic atherosclerosis. There is calcification in the proximal right renal artery. There is also mild calcification in the left common iliac artery. There is no  evident abdominal aortic aneurysm. No adenopathy evident in the abdomen or pelvis. Reproductive: There are a few prostatic calculi. Prostate and seminal vesicles are normal in size and contour. No pelvic mass evident. Other: Appendix appears normal. There is no evident abscess or ascites in the abdomen or pelvis. There is fat in each inguinal ring. Musculoskeletal: No blastic or lytic bone lesions. There is degenerative change in the thoracic spine. No intramuscular lesions are evident. IMPRESSION: 1. 7 x 5 mm calculus in the proximal right ureter at the L3-4 level with mild hydronephrosis and mild right renal edema. 2.  Nonobstructing calculus in the lower pole of each kidney. 3. Fluid throughout most small bowel loops, a finding that may indicate a degree of enteritis or ileus. Ileus secondary to the obstructing calculus on the right is quite possible. No bowel obstruction evident. 4.  No abscess.  Appendix appears normal. 5. Aortic Atherosclerosis (ICD10-I70.0). There is also calcification in the right proximal renal artery as well as foci of coronary artery calcification. Electronically Signed   By: Lowella Grip III M.D.   On: 02/07/2020 08:42     Assessment and Recommendation  49 y.o. male with known  essential hypertension mixed hyperlipidemia coronary artery disease status post previous stent placement with a non-ST elevation myocardial infarction 1.  Continue heparin aspirin for further risk reduction of myocardial infarction 2.  Continue nitrate as necessary topically for chest discomfort 3.  Further medication management for hypertension control 4.  High intensity cholesterol therapy 5.  Continue supportive therapy and treatment of pain from kidney stone 6.  Further intervention including cardiac catheterization to assess coronary anatomy and further treatment thereof is necessary.  Patient understands the risk and benefits of cardiac catheterization.  This includes the possibility death  stroke heart attack infection bleeding or blood clot.  He is at low risk for conscious sedation  Signed, Serafina Royals M.D. FACC

## 2020-02-08 NOTE — Progress Notes (Addendum)
Per Dr. Nehemiah Massed, cath will not be today. Patient can have heart healthy diabetic diet.  Dr. Nehemiah Massed also aware of elevated BPs. MD to review orders.

## 2020-02-08 NOTE — Progress Notes (Signed)
   Subjective Intermittent renal colic and chest pain overnight, pain control improved this morning with IV Toradol and morphine   Physical Exam: BP (!) 146/115 (BP Location: Right Arm)   Pulse 86   Temp 98 F (36.7 C) (Oral)   Resp 18   Ht 5\' 11"  (1.803 m)   Wt 88.5 kg   SpO2 97%   BMI 27.20 kg/m    Constitutional:  Alert and oriented, No acute distress.  Appears comfortable Respiratory: Normal respiratory effort, no increased work of breathing. GI: Abdomen is soft, non-tender, non-distended  Laboratory Data: Reviewed Troponin continues to trend up-> 17,310 overnight from 14,856  Assessment & Plan:   49 year old male with extensive cardiac history admitted 2/12 with right renal colic and chest pain.  CT shows a 7 mm right proximal ureteral stone with upstream hydroureteronephrosis, and there are no clinical or laboratory signs of infection.  He also has chest pain with troponin that continues to rise significantly consistent with NSTEMI.  We discussed his complex situation at length.  His cardiac work-up takes priority over his renal colic and stone, and cardiology is recommending cardiac cath on Monday.  No plans for surgical intervention from urology perspective over the weekend.  Would continue to manage pain with IV Toradol and morphine, Flomax for medical expulsive therapy, and strain urine.  I discussed his case at length with Dr. Nehemiah Massed of cardiology.  -Agree with cardiac catheterization Monday, will follow up results and consider ureteroscopy next week if cleared from cardiology perspective and has not passed stone and continues to have pain -Continue Flomax, strain urine, pain control -Urology will continue to follow, call if questions  A total of 35 minutes was spent on the floor, with greater than 50% spent in counseling and coordinating care for renal colic secondary to a 7 mm right proximal ureteral stone as well as NSTEMI with rising troponin.  Billey Co,  MD

## 2020-02-08 NOTE — Progress Notes (Signed)
PROGRESS NOTE    John Hartman  [id number] DOB: 12/08/1971 DOA: 02/07/2020  PCP: [name], FNP    LOS - 1   Brief Narrative:  John Hartman is a 49 y.o. male with medical history significant of hypertension, hyperlipidemia, diabetes mellitus, GERD, gout, CAD, myocardial infarction, sCHF with EF 35-40%, who presented to the ED on 2/12 with right flank pain of sudden onset earlier that morning.  He also had an episode of substernal chest pain with radiation to the shoulder that had resolved prior to coming to the ED.  In the ED patient's initial troponin was 10, however repeats were significantly elevated up to 9920.  Cardiology was consulted.  CT renal stone study showed a 7 x 5 mm calculus in the proximal right ureter and mild hydronephrosis and renal edema.  Urology was consulted.  Patient admitted for further evaluation management.  Subjective 2/13: Patient seen this morning at bedside, wife present.  He reports feeling better but has a headache right now.  He is not sure if he still has any nitro on.  States kidney stone pain is controlled as long as medications given on schedule.  States he tried to prolong time between doses and had significant exacerbation of pain.  Currently chest pain is very mild.  He denies fever chills, shortness of breath, nausea vomiting diarrhea or other acute complaints.  Did have a episode of emesis due to severe kidney stone pain last night.  Assessment & Plan:   Principal Problem:   NSTEMI (non-ST elevated myocardial infarction) ([place]) Active Problems:   Essential hypertension   Coronary artery disease   Hyperlipidemia LDL goal <70   Right ureteral stone   Diabetes mellitus without complication (HCC)   Chronic systolic CHF (congestive heart failure) (HCC)   Non-STEMI History of coronary artery disease Elevated troponins peaked at 17,310, last repeat 7799. --Cardiology following, Dr. Nehemiah Massed --Plan for left heart cath on Monday --Continue  heparin drip --Continue aspirin statin beta-blocker  Chronic systolic CHF -currently well compensated and euvolemic Echo in August 2019 showed EF 35 to 40% --Continue aspirin beta-blocker hydralazine --Allergic to ACE inhibitors  Right ureteral stone -in addition to stones in the poles of both kidneys. --Urology following, Dr. Diamantina Providence --Intervention on hold at least until after cardiac cath --Pain control for now -IV Dilaudid and Toradol --IV fluid --Flomax --Strain urine  Headache, acute -possibly due to nitroglycerin, morphine can also cause headaches. Very short period of relief after morphine and Toradol were given this morning. --Fioricet as needed --We will switch morphine to equivalent dose of Dilaudid for renal colic pain control   Essential hypertension -chronic and currently uncontrolled, likely due to pain and getting Toradol. Home meds appear to be hydralazine 25 mg twice daily, Lopressor 25 mg twice daily. Note allergy to ACE inhibitor's. --Continue home hydralazine in addition to as needed --Continue Lopressor as well --Added amlodipine, increase to 10 mg --Monitor BP closely and watch for any signs of end-organ damage  Hyperlipidemia -goal LDL less than 70 --Continue home statin  Type 2 diabetes  Last A1c 7.2%, poorly controlled.   Home regimen is Rybelsus (semaglutide) and Xigduo-XR (Dapagliflozin-metFORMIN). --Hold home meds --Sliding scale NovoLog --Continue Lyrica for neuropathy  Gout -not acutely flared. -Continue allopurinol  Continue other home meds: B12, Zyrtec, Zanaflex, ophthalmic drops, melatonin, Benadryl  Hold home meds: Celecoxib, buprenorphine   DVT prophylaxis: On heparin drip   Code Status: Full Code  Family Communication: Wife at bedside  Disposition Plan: Expect  discharge to prior home environment pending further evaluation and clinical improvement, clearance by cardiology and urology. Coming From home Exp DC Date to be  determined, expect earliest 2/16-17 Barriers as above Medically Stable for Discharge?  No  Consultants:   Cardiology, Dr. Nehemiah Massed  Urology, Dr. Diamantina Providence  Procedures:   None  Antimicrobials:   None   Objective: Vitals:   02/07/20 1926 02/08/20 0435 02/08/20 0805 02/08/20 1232  BP: (!) 167/117 (!) 177/126 (!) 146/115 (!) 153/114  Pulse: (!) 101 100 86 90  Resp:   18 16  Temp: 97.9 F (36.6 C) 98.6 F (37 C) 98 F (36.7 C) 98.1 F (36.7 C)  TempSrc: Oral Oral Oral Oral  SpO2: 98% 94% 97% 99%  Weight:      Height:        Intake/Output Summary (Last 24 hours) at 02/08/2020 1515 Last data filed at 02/08/2020 1330 Gross per 24 hour  Intake 600 ml  Output 4225 ml  Net -3625 ml   Filed Weights   02/07/20 0756  Weight: 88.5 kg    Examination:  General exam: awake, alert, no acute distress HEENT: clear conjunctiva, anicteric sclera, moist mucus membranes, hearing grossly normal  Respiratory system: CTAB, no wheezes, rales or rhonchi, normal respiratory effort. Cardiovascular system: normal S1/S2, RRR, no JVD, murmurs, rubs, gallops, no pedal edema.   Gastrointestinal system: soft, NT, ND, positive bowel sounds Central nervous system: A&O x4. no gross focal neurologic deficits, normal speech Extremities: moves all, no cyanosis, normal tone Skin: dry, intact, normal temperature, normal color, no rashes seen Psychiatry: normal mood, congruent affect, judgement and insight appear normal    Data Reviewed: I have personally reviewed following labs and imaging studies  CBC: Recent Labs  Lab 02/07/20 0806 02/08/20 0207  WBC 9.5 11.0*  HGB 16.6 15.8  HCT 51.0 48.8  MCV 80.6 80.4  PLT 221 [id number]   Basic Metabolic Panel: Recent Labs  Lab 02/07/20 0806  NA 139  K 4.0  CL 103  CO2 20*  GLUCOSE 190*  BUN 17  CREATININE 1.01  CALCIUM 9.8   GFR: Estimated Creatinine Clearance: 97.3 mL/min (by C-G formula based on SCr of 1.01 mg/dL). Liver Function Tests: No  results for input(s): AST, ALT, ALKPHOS, BILITOT, PROT, ALBUMIN in the last 168 hours. No results for input(s): LIPASE, AMYLASE in the last 168 hours. No results for input(s): AMMONIA in the last 168 hours. Coagulation Profile: Recent Labs  Lab 02/07/20 1438  INR 1.1   Cardiac Enzymes: No results for input(s): CKTOTAL, CKMB, CKMBINDEX, TROPONINI in the last 168 hours. BNP (last 3 results) No results for input(s): PROBNP in the last 8760 hours. HbA1C: Recent Labs    02/08/20 0207  HGBA1C 6.4*   CBG: Recent Labs  Lab 02/07/20 1630 02/07/20 2129 02/08/20 0804 02/08/20 1219  GLUCAP 126* 144* 129* 153*   Lipid Profile: Recent Labs    02/08/20 0207  CHOL 145  HDL 33*  LDLCALC 95  TRIG 84  CHOLHDL 4.4   Thyroid Function Tests: No results for input(s): TSH, T4TOTAL, FREET4, T3FREE, THYROIDAB in the last 72 hours. Anemia Panel: No results for input(s): VITAMINB12, FOLATE, FERRITIN, TIBC, IRON, RETICCTPCT in the last 72 hours. Sepsis Labs: No results for input(s): PROCALCITON, LATICACIDVEN in the last 168 hours.  Recent Results (from the past 240 hour(s))  Respiratory Panel by RT PCR (Flu A&B, Covid) - Nasopharyngeal Swab     Status: None   Collection Time: 02/07/20  2:38 PM  Specimen: Nasopharyngeal Swab  Result Value Ref Range Status   SARS Coronavirus 2 by RT PCR NEGATIVE NEGATIVE Final    Comment: (NOTE) SARS-CoV-2 target nucleic acids are NOT DETECTED. The SARS-CoV-2 RNA is generally detectable in upper respiratoy specimens during the acute phase of infection. The lowest concentration of SARS-CoV-2 viral copies this assay can detect is 131 copies/mL. A negative result does not preclude SARS-Cov-2 infection and should not be used as the sole basis for treatment or other patient management decisions. A negative result may occur with  improper specimen collection/handling, submission of specimen other than nasopharyngeal swab, presence of viral mutation(s) within  the areas targeted by this assay, and inadequate number of viral copies (<131 copies/mL). A negative result must be combined with clinical observations, patient history, and epidemiological information. The expected result is Negative. Fact Sheet for Patients:  PinkCheek.be Fact Sheet for Healthcare Providers:  GravelBags.it This test is not yet ap proved or cleared by the [place] FDA and  has been authorized for detection and/or diagnosis of SARS-CoV-2 by FDA under an Emergency Use Authorization (EUA). This EUA will remain  in effect (meaning this test can be used) for the duration of the COVID-19 declaration under Section 564(b)(1) of the Act, 21 U.S.C. section 360bbb-3(b)(1), unless the authorization is terminated or revoked sooner.    Influenza A by PCR NEGATIVE NEGATIVE Final   Influenza B by PCR NEGATIVE NEGATIVE Final    Comment: (NOTE) The Xpert Xpress SARS-CoV-2/FLU/RSV assay is intended as an aid in  the diagnosis of influenza from Nasopharyngeal swab specimens and  should not be used as a sole basis for treatment. Nasal washings and  aspirates are unacceptable for Xpert Xpress SARS-CoV-2/FLU/RSV  testing. Fact Sheet for Patients: PinkCheek.be Fact Sheet for Healthcare Providers: GravelBags.it This test is not yet approved or cleared by the [place] FDA and  has been authorized for detection and/or diagnosis of SARS-CoV-2 by  FDA under an Emergency Use Authorization (EUA). This EUA will remain  in effect (meaning this test can be used) for the duration of the  Covid-19 declaration under Section 564(b)(1) of the Act, 21  U.S.C. section 360bbb-3(b)(1), unless the authorization is  terminated or revoked. Performed at [facility], [address], [place], [place] [address]          Radiology Studies: DG Abdomen 1 View  Result  Date: 02/07/2020 CLINICAL DATA:  Right flank pain. EXAM: ABDOMEN - 1 VIEW COMPARISON:  November 27, 2019. FINDINGS: The bowel gas pattern is normal. Bilateral nephrolithiasis is noted. Left ureteral stent is not visualized and has been removed. IMPRESSION: Bilateral nephrolithiasis. Left ureteral stent has been removed. No evidence of bowel obstruction or ileus. Electronically Signed   By: Marijo Conception M.D.   On: 02/07/2020 12:20   CT Renal Stone Study  Result Date: 02/07/2020 CLINICAL DATA:  And flank pain EXAM: CT ABDOMEN AND PELVIS WITHOUT CONTRAST TECHNIQUE: Multidetector CT imaging of the abdomen and pelvis was performed following the standard protocol without oral or IV contrast. COMPARISON:  March 26, 2019 FINDINGS: Lower chest: Lung bases are clear. There are foci of coronary artery calcification. Hepatobiliary: No focal liver lesions are evident on this noncontrast enhanced study. Gallbladder wall is not appreciably thickened. There is no biliary duct dilatation. Pancreas: There is no pancreatic mass or inflammatory focus. Spleen: No splenic lesions are evident. Adrenals/Urinary Tract: Adrenals bilaterally appear normal. Right kidney is subtly edematous. There is a cyst in the mid left kidney  measuring 1.2 x 1.0 cm, better seen on prior contrast enhanced study. There is mild hydronephrosis on the right. There is no appreciable hydronephrosis on the left. There is a 3 x 2 mm calculus in the lower pole of the right kidney. There is a 4 x 3 mm calculus in the lower pole of the left kidney. There is a calculus in the proximal right ureter at the L3-4 level measuring 7 x 5 mm. No other ureteral calculi are evident. Urinary bladder is midline with wall thickness within normal limits. Stomach/Bowel: There is no appreciable bowel wall or mesenteric thickening. There is moderate stool in the colon. Note that most small bowel loops are fluid-filled. There is no demonstrable bowel obstruction. The terminal ileum  appears unremarkable. There is no evident free air or portal venous air. Vascular/Lymphatic: There is aortic atherosclerosis. There is calcification in the proximal right renal artery. There is also mild calcification in the left common iliac artery. There is no evident abdominal aortic aneurysm. No adenopathy evident in the abdomen or pelvis. Reproductive: There are a few prostatic calculi. Prostate and seminal vesicles are normal in size and contour. No pelvic mass evident. Other: Appendix appears normal. There is no evident abscess or ascites in the abdomen or pelvis. There is fat in each inguinal ring. Musculoskeletal: No blastic or lytic bone lesions. There is degenerative change in the thoracic spine. No intramuscular lesions are evident. IMPRESSION: 1. 7 x 5 mm calculus in the proximal right ureter at the L3-4 level with mild hydronephrosis and mild right renal edema. 2.  Nonobstructing calculus in the lower pole of each kidney. 3. Fluid throughout most small bowel loops, a finding that may indicate a degree of enteritis or ileus. Ileus secondary to the obstructing calculus on the right is quite possible. No bowel obstruction evident. 4.  No abscess.  Appendix appears normal. 5. Aortic Atherosclerosis (ICD10-I70.0). There is also calcification in the right proximal renal artery as well as foci of coronary artery calcification. Electronically Signed   By: Lowella Grip III M.D.   On: 02/07/2020 08:42        Scheduled Meds: . allopurinol  100 mg Oral Daily  . amLODipine  5 mg Oral Daily  . aspirin EC  81 mg Oral Daily  . atorvastatin  80 mg Oral Q supper  . diphenhydrAMINE  50 mg Oral QHS  . hydrALAZINE  25 mg Oral BID  . insulin aspart  0-5 Units Subcutaneous QHS  . insulin aspart  0-9 Units Subcutaneous TID WC  . ketorolac  15 mg Intravenous Q6H  . loratadine  10 mg Oral Daily  . Melatonin  10 mg Oral QHS  . metoprolol tartrate  25 mg Oral BID  . neomycin-polymyxin b-dexamethasone  1  drop Left Eye Daily  . pregabalin  50 mg Oral TID  . sodium chloride flush  3 mL Intravenous Q12H  . tamsulosin  0.4 mg Oral QPC supper  . vitamin B-12  1,000 mcg Oral Once per day on Mon Wed Fri   Continuous Infusions: . sodium chloride 75 mL/hr at 02/08/20 1035  . heparin 1,200 Units/hr (02/08/20 0621)     LOS: 1 day    Time spent: 40 minutes    Ezekiel Slocumb, DO [facility]   If 7PM-7AM, please contact night-coverage [email or web address] 02/08/2020, 3:15 PM

## 2020-02-09 LAB — GLUCOSE, CAPILLARY
Glucose-Capillary: 124 mg/dL — ABNORMAL HIGH (ref 70–99)
Glucose-Capillary: 130 mg/dL — ABNORMAL HIGH (ref 70–99)
Glucose-Capillary: 146 mg/dL — ABNORMAL HIGH (ref 70–99)
Glucose-Capillary: 156 mg/dL — ABNORMAL HIGH (ref 70–99)

## 2020-02-09 LAB — CBC
HCT: 46.5 % (ref 39.0–52.0)
Hemoglobin: 14.8 g/dL (ref 13.0–17.0)
MCH: 26 pg (ref 26.0–34.0)
MCHC: 31.8 g/dL (ref 30.0–36.0)
MCV: 81.7 fL (ref 80.0–100.0)
Platelets: 149 10*3/uL — ABNORMAL LOW (ref 150–400)
RBC: 5.69 MIL/uL (ref 4.22–5.81)
RDW: 14.5 % (ref 11.5–15.5)
WBC: 7.7 10*3/uL (ref 4.0–10.5)
nRBC: 0 % (ref 0.0–0.2)

## 2020-02-09 LAB — ECHOCARDIOGRAM COMPLETE
Height: 71 in
Weight: 3120 oz

## 2020-02-09 LAB — BASIC METABOLIC PANEL
Anion gap: 6 (ref 5–15)
BUN: 15 mg/dL (ref 6–20)
CO2: 28 mmol/L (ref 22–32)
Calcium: 8.8 mg/dL — ABNORMAL LOW (ref 8.9–10.3)
Chloride: 105 mmol/L (ref 98–111)
Creatinine, Ser: 1.4 mg/dL — ABNORMAL HIGH (ref 0.61–1.24)
GFR calc Af Amer: >60 mL/min (ref >60)
GFR calc non Af Amer: 60 mL/min — ABNORMAL LOW (ref >60)
Glucose, Bld: 139 mg/dL — ABNORMAL HIGH (ref 70–99)
Potassium: 3.7 mmol/L (ref 3.5–5.1)
Sodium: 139 mmol/L (ref 135–145)

## 2020-02-09 LAB — HEPARIN LEVEL (UNFRACTIONATED): Heparin Unfractionated: 0.47 IU/mL (ref 0.30–0.70)

## 2020-02-09 MED ORDER — SODIUM CHLORIDE 0.9 % WEIGHT BASED INFUSION
1.0000 mL/kg/h | INTRAVENOUS | Status: DC
  Administered 2020-02-10: 1 mL/kg/h via INTRAVENOUS

## 2020-02-09 MED ORDER — HYDROMORPHONE HCL 1 MG/ML IJ SOLN
1.0000 mg | INTRAMUSCULAR | Status: DC | PRN
  Administered 2020-02-09 – 2020-02-11 (×11): 1 mg via INTRAVENOUS
  Filled 2020-02-09 (×10): qty 1

## 2020-02-09 MED ORDER — ASPIRIN 81 MG PO CHEW
81.0000 mg | CHEWABLE_TABLET | ORAL | Status: AC
  Administered 2020-02-10: 81 mg via ORAL
  Filled 2020-02-09: qty 1

## 2020-02-09 MED ORDER — LABETALOL HCL 5 MG/ML IV SOLN: 10.0000 mg | INTRAVENOUS | Status: DC | PRN

## 2020-02-09 MED ORDER — SODIUM CHLORIDE 0.9 % IV SOLN: 250.0000 mL | INTRAVENOUS | Status: DC | PRN

## 2020-02-09 MED ORDER — SODIUM CHLORIDE 0.9% FLUSH: 3.0000 mL | INTRAVENOUS | Status: DC | PRN

## 2020-02-09 MED ORDER — SODIUM CHLORIDE 0.9 % WEIGHT BASED INFUSION
3.0000 mL/kg/h | INTRAVENOUS | Status: AC
  Administered 2020-02-10: 3 mL/kg/h via INTRAVENOUS

## 2020-02-09 MED ORDER — HYDROMORPHONE HCL 1 MG/ML IJ SOLN
0.5000 mg | INTRAMUSCULAR | Status: DC | PRN
  Administered 2020-02-09: 0.5 mg via INTRAVENOUS
  Filled 2020-02-09: qty 1

## 2020-02-09 NOTE — Progress Notes (Signed)
Pt stated he was having severe squeezing chest pain. States he was talking on the phone when he felt like he was going to pass out and then the squeezing chest pressure started. Nitro given x1. VSS, EKG completed and MD notified. Squeezing pressure improved after about 5-10 minutes to dull tightness. Dr. Arbutus Ped in to see pt. New orders received.

## 2020-02-09 NOTE — Consult Note (Signed)
[place] for [facility]n Indication: chest pain/ACS  Allergies  Allergen Reactions  . Lisinopril Swelling  . Ace Inhibitors Swelling    Patient Measurements: Height: 5\' 11"  (180.3 cm) Weight: 200 lb 8 oz (90.9 kg) IBW/kg (Calculated) : 75.3 Heparin Dosing Weight: 88.5 kg  Vital Signs: Temp: 98.1 F (36.7 C) (02/14 0417) Temp Source: Oral (02/14 0417) BP: 131/97 (02/14 0417) Pulse Rate: 85 (02/14 0417)  Labs: Recent Labs    02/07/20 0806 02/07/20 0806 02/07/20 0917 02/07/20 1438 02/07/20 1602 02/07/20 2015 02/07/20 2146 02/08/20 0207 02/08/20 0834 02/09/20 0533  HGB 16.6   < >  --   --   --   --   --  15.8  --  14.8  HCT 51.0  --   --   --   --   --   --  48.8  --  46.5  PLT 221  --   --   --   --   --   --  174  --  149*  APTT  --   --   --  102*  --   --   --   --   --   --   LABPROT  --   --   --  14.0  --   --   --   --   --   --   INR  --   --   --  1.1  --   --   --   --   --   --   HEPARINUNFRC  --   --   --   --   --  0.55  --  0.47  --  0.47  CREATININE 1.01  --   --   --   --   --   --   --   --   --   TROPONINIHS  --   --    < >  --    < > 14,856* 17,310*  --  7,799*  --    < > = values in this interval not displayed.    Estimated Creatinine Clearance: 105.3 mL/min (by C-G formula based on SCr of 1.01 mg/dL).   Medical History: Past Medical History:  Diagnosis Date  . Angioedema   . Bronchitis   . DDD (degenerative disc disease), cervical   . Diabetes mellitus ([place])   . Fatty liver 03/31/2017   [place] April 2018  . GERD (gastroesophageal reflux disease)   . Gout   . Heart attack ([place])   . HTN (hypertension)   . Hypertension    CONTROLLED ON MEDS  . IBS (irritable bowel syndrome)   . Kidney stone   . LVH (left ventricular hypertrophy) 11/24/2018   Moderate, ECHO  . Myocardial infarction ([place]) 08/23/2018  . Seasonal allergies   . Spinal stenosis     Medications:  Medications Prior to Admission   Medication Sig Dispense Refill Last Dose  . allopurinol (ZYLOPRIM) 100 MG tablet Take 100 mg by mouth daily.    02/06/2020 at Unknown time  . aspirin EC 81 MG tablet Take 81 mg by mouth daily.   02/06/2020 at Unknown time  . atorvastatin (LIPITOR) 80 MG tablet Take 1 tablet (80 mg total) by mouth daily at 6 PM. (Patient taking differently: Take 80 mg by mouth daily with supper. ) 30 tablet 0 02/06/2020 at Unknown time  . cetirizine (ZYRTEC) 10 MG tablet Take 10 mg  by mouth at bedtime.   02/06/2020 at Unknown time  . Dapagliflozin-metFORMIN HCl ER (XIGDUO XR) 04-999 MG TB24 Take 2 tablets by mouth daily.    02/06/2020 at Unknown time  . diphenhydrAMINE (BENADRYL) 25 MG tablet Take 50 mg by mouth at bedtime.   02/06/2020 at Unknown time  . hydrALAZINE (APRESOLINE) 25 MG tablet Take 1 tablet (25 mg total) by mouth 2 (two) times daily. 60 tablet 0 02/06/2020 at Unknown time  . Melatonin 5 MG TABS Take 10 mg by mouth at bedtime.    02/06/2020 at Unknown time  . metoprolol tartrate (LOPRESSOR) 25 MG tablet Take 25 mg by mouth 2 (two) times daily.   02/06/2020 at Unknown time  . neomycin-polymyxin b-dexamethasone (MAXITROL) 3.5-10000-0.1 SUSP Place 1 drop into the left eye daily.   02/06/2020 at Unknown time  . pregabalin (LYRICA) 50 MG capsule Take 1 capsule (50 mg total) by mouth 3 (three) times daily. 90 capsule 2 02/06/2020 at Unknown time  . RYBELSUS 3 MG TABS Take 3 mg by mouth daily at 6 (six) AM.    02/06/2020 at Unknown time  . tiZANidine (ZANAFLEX) 4 MG tablet Take 1 tablet (4 mg total) by mouth every 8 (eight) hours as needed for muscle spasms. 90 tablet 5 02/06/2020 at Unknown time  . vitamin B-12 (CYANOCOBALAMIN) 1000 MCG tablet Take 1,000 mcg by mouth 3 (three) times a week.   02/06/2020 at Unknown time  . ACCU-CHEK SOFTCLIX LANCETS lancets USE AS DIRECTED 100 each 0   . BELBUCA 450 MCG FILM Place 1 Film inside cheek 2 (two) times daily. (Patient not taking: Reported on 02/07/2020) 60 each 1 Not Taking at  Unknown time  . ezetimibe (ZETIA) 10 MG tablet Take 1 tablet (10 mg total) by mouth daily. (Patient not taking: Reported on 01/06/2020) 30 tablet 11 Not Taking at Unknown time  . ketorolac (TORADOL) 10 MG tablet Take 1 tablet (10 mg total) by mouth every 6 (six) hours as needed. (Patient not taking: Reported on 02/07/2020) 20 tablet 0 Not Taking at Unknown time  . Menthol, Topical Analgesic, (BIOFREEZE EX) Apply 1 application topically 3 (three) times daily as needed (muscle cramps/shoulder pain.).   PRN at PRN  . nitroGLYCERIN (NITROSTAT) 0.4 MG SL tablet Place 1 tablet (0.4 mg total) under the tongue every 5 (five) minutes as needed for chest pain. 30 tablet 0 PRN at PRN  . ONETOUCH VERIO test strip USE 2 (TWO) TIMES DAILY USE AS INSTRUCTED.      Scheduled:  . allopurinol  100 mg Oral Daily  . amLODipine  10 mg Oral Daily  . aspirin EC  81 mg Oral Daily  . atorvastatin  80 mg Oral Q supper  . diphenhydrAMINE  50 mg Oral QHS  . hydrALAZINE  25 mg Oral BID  . insulin aspart  0-5 Units Subcutaneous QHS  . insulin aspart  0-9 Units Subcutaneous TID WC  . ketorolac  15 mg Intravenous Q6H  . loratadine  10 mg Oral Daily  . Melatonin  10 mg Oral QHS  . metoprolol tartrate  25 mg Oral BID  . neomycin-polymyxin b-dexamethasone  1 drop Left Eye Daily  . pregabalin  50 mg Oral TID  . sodium chloride flush  3 mL Intravenous Q12H  . tamsulosin  0.4 mg Oral QPC supper  . vitamin B-12  1,000 mcg Oral Once per day on Mon Wed Fri   Infusions:  . sodium chloride 75 mL/hr at 02/08/20 2254  .  heparin 1,200 Units/hr (02/09/20 0432)   PRN: acetaminophen, ALPRAZolam, butalbital-acetaminophen-caffeine, hydrALAZINE, HYDROmorphone (DILAUDID) injection, metoprolol tartrate, nitroGLYCERIN, ondansetron (ZOFRAN) IV, oxyCODONE-acetaminophen, tiZANidine Anti-infectives (From admission, onward)   None      Assessment: Pharmacy consulted to start heparin infusion for ACS. No DOAC PTA.   Goal of Therapy:   Heparin level 0.3-0.7 units/ml Monitor platelets by anticoagulation protocol: Yes   Plan:  02/14 @ 0533 HL 0.47. Level remains therapeutic. Platelets trending down (221>>174>> 149)  Continue current rate of 1200 units/hr and will recheck HL w/ am labs, Continue to monitor CBC daily.   Pernell Dupre, PharmD, BCPS Clinical Pharmacist 02/09/2020 6:34 AM

## 2020-02-09 NOTE — Progress Notes (Signed)
[facility] Encounter Note  Patient: John Hartman / Admit Date: 02/07/2020 / Date of Encounter: 02/09/2020, 7:04 AM   Subjective: Patient has had a waxing and waning flank pain from his kidney stones which is relatively well controlled at this time and definitely improved since admission.  Patient also had chest discomfort substernally waxing and waning but not as intense as yesterday.  Currently stable on heparin.  The patient has had elevated troponin trending higher at [address] consistent with a non-ST elevation myocardial infarction.  Other medication management is improving although patient does have hypertension which may need further intervention Echocardiogram shows mild LV systolic dysfunction possibly secondary to stress-induced cardiomyopathy versus previous myocardial infarction with ejection fraction of 40%  Review of Systems: Positive for: Chest pain flank pain but resolving Negative for: Vision change, hearing change, syncope, dizziness, nausea, vomiting,diarrhea, bloody stool, stomach pain, cough, congestion, diaphoresis, urinary frequency, urinary pain,skin lesions, skin rashes Others previously listed  Objective: Telemetry: Normal sinus rhythm Physical Exam: Blood pressure (!) 131/97, pulse 85, temperature 98.1 F (36.7 C), temperature source Oral, resp. rate 16, height 5\' 11"  (1.803 m), weight 90.9 kg, SpO2 97 %. Body mass index is 27.96 kg/m. General: Well developed, well nourished, in no acute distress. Head: Normocephalic, atraumatic, sclera non-icteric, no xanthomas, nares are without discharge. Neck: No apparent masses Lungs: Normal respirations with no wheezes, no rhonchi, no rales , no crackles   Heart: Regular rate and rhythm, normal S1 S2, no murmur, no rub, no gallop, PMI is normal size and placement, carotid upstroke normal without bruit, jugular venous pressure normal Abdomen: Soft, non-tender, non-distended with normoactive bowel sounds. No  hepatosplenomegaly. Abdominal aorta is normal size without bruit Extremities: No edema, no clubbing, no cyanosis, no ulcers,  Peripheral: 2+ radial, 2+ femoral, 2+ dorsal pedal pulses Neuro: Alert and oriented. Moves all extremities spontaneously. Psych:  Responds to questions appropriately with a normal affect.   Intake/Output Summary (Last 24 hours) at 02/09/2020 0704 Last data filed at 02/09/2020 0011 Gross per 24 hour  Intake 2356.57 ml  Output 2350 ml  Net 6.57 ml    Inpatient Medications:  . allopurinol  100 mg Oral Daily  . amLODipine  10 mg Oral Daily  . aspirin EC  81 mg Oral Daily  . atorvastatin  80 mg Oral Q supper  . diphenhydrAMINE  50 mg Oral QHS  . hydrALAZINE  25 mg Oral BID  . insulin aspart  0-5 Units Subcutaneous QHS  . insulin aspart  0-9 Units Subcutaneous TID WC  . ketorolac  15 mg Intravenous Q6H  . loratadine  10 mg Oral Daily  . Melatonin  10 mg Oral QHS  . metoprolol tartrate  25 mg Oral BID  . neomycin-polymyxin b-dexamethasone  1 drop Left Eye Daily  . pregabalin  50 mg Oral TID  . sodium chloride flush  3 mL Intravenous Q12H  . tamsulosin  0.4 mg Oral QPC supper  . vitamin B-12  1,000 mcg Oral Once per day on Mon Wed Fri   Infusions:  . sodium chloride 75 mL/hr at 02/08/20 2254  . heparin 1,200 Units/hr (02/09/20 0432)    Labs: Recent Labs    02/07/20 0806 02/09/20 0533  NA 139 139  K 4.0 3.7  CL 103 105  CO2 20* 28  GLUCOSE 190* 139*  BUN 17 15  CREATININE 1.01 1.40*  CALCIUM 9.8 8.8*   No results for input(s): AST, ALT, ALKPHOS, BILITOT, PROT, ALBUMIN in the last 72  hours. Recent Labs    02/08/20 0207 02/09/20 0533  WBC 11.0* 7.7  HGB 15.8 14.8  HCT 48.8 46.5  MCV 80.4 81.7  PLT 174 149*   No results for input(s): CKTOTAL, CKMB, TROPONINI in the last 72 hours. Invalid input(s): POCBNP Recent Labs    02/08/20 0207  HGBA1C 6.4*     Weights: Filed Weights   02/07/20 0756 02/09/20 0417  Weight: 88.5 kg 90.9 kg      Radiology/Studies:  DG Abdomen 1 View  Result Date: 02/07/2020 CLINICAL DATA:  Right flank pain. EXAM: ABDOMEN - 1 VIEW COMPARISON:  November 27, 2019. FINDINGS: The bowel gas pattern is normal. Bilateral nephrolithiasis is noted. Left ureteral stent is not visualized and has been removed. IMPRESSION: Bilateral nephrolithiasis. Left ureteral stent has been removed. No evidence of bowel obstruction or ileus. Electronically Signed   By: Marijo Conception M.D.   On: 02/07/2020 12:20   ECHOCARDIOGRAM COMPLETE  Result Date: 02/09/2020    ECHOCARDIOGRAM REPORT   Patient Name:   John Hartman Date of Exam: 02/08/2020 Medical Rec #:  [id number]   Height:       71.0 in Accession #:    [id number]  Weight:       195.0 lb Date of Birth:  1971-07-06    BSA:          2.09 m Patient Age:    49 years    BP:           177/123 mmHg Patient Gender: M           HR:           91 bpm. Exam Location:  [facility] Procedure: 2D Echo Indications:     NSTEMI I21.4  History:         Patient has prior history of Echocardiogram examinations, most                  recent 08/24/2018. CHF, CAD; Risk Factors:Hypertension and                  Dyslipidemia.  Sonographer:     Avanell Shackleton Referring Phys:  Unknown Foley NIU Diagnosing Phys: Serafina Royals MD IMPRESSIONS  1. Left ventricular ejection fraction, by estimation, is 40 to 45%. The left ventricle has mildly decreased function. The left ventricle demonstrates global hypokinesis. Left ventricular diastolic parameters were normal.  2. Right ventricular systolic function is normal. The right ventricular size is normal.  3. The mitral valve is normal in structure and function. Mild mitral valve regurgitation.  4. The aortic valve is normal in structure and function. Aortic valve regurgitation is not visualized. FINDINGS  Left Ventricle: Left ventricular ejection fraction, by estimation, is 40 to 45%. The left ventricle has mildly decreased function. The left ventricle demonstrates global  hypokinesis. There is no left ventricular hypertrophy. Left ventricular diastolic parameters were normal. Right Ventricle: The right ventricular size is normal. No increase in right ventricular wall thickness. Right ventricular systolic function is normal. Left Atrium: Left atrial size was normal in size. Right Atrium: Right atrial size was normal in size. Pericardium: There is no evidence of pericardial effusion. Mitral Valve: The mitral valve is normal in structure and function. Mild mitral valve regurgitation. Tricuspid Valve: The tricuspid valve is normal in structure. Tricuspid valve regurgitation is trivial. Aortic Valve: The aortic valve is normal in structure and function. Aortic valve regurgitation is not visualized. Pulmonic Valve: The pulmonic valve was normal in structure. Pulmonic valve  regurgitation is not visualized. Aorta: The aortic root and ascending aorta are structurally normal, with no evidence of dilitation. IAS/Shunts: No atrial level shunt detected by color flow Doppler.  LEFT VENTRICLE PLAX 2D LVIDd:         4.31 cm LVIDs:         3.11 cm LV PW:         1.14 cm LV IVS:        1.59 cm LVOT diam:     2.20 cm LV SV Index:   21.38 LVOT Area:     3.80 cm  LEFT ATRIUM             Index       RIGHT ATRIUM           Index LA diam:        4.60 cm 2.21 cm/m  RA Area:     21.80 cm LA Vol (A2C):   53.5 ml 25.65 ml/m RA Volume:   67.90 ml  32.55 ml/m LA Vol (A4C):   46.4 ml 22.24 ml/m LA Biplane Vol: 52.5 ml 25.17 ml/m   AORTA Ao Root diam: 3.50 cm MITRAL VALVE MV Area (PHT): 2.54 cm    SHUNTS MV Decel Time: 299 msec    Systemic Diam: 2.20 cm MV E velocity: 52.40 cm/s MV A velocity: 84.40 cm/s MV E/A ratio:  0.62 Serafina Royals MD Electronically signed by Serafina Royals MD Signature Date/Time: 02/09/2020/6:26:29 AM    Final    CT Renal Stone Study  Result Date: 02/07/2020 CLINICAL DATA:  And flank pain EXAM: CT ABDOMEN AND PELVIS WITHOUT CONTRAST TECHNIQUE: Multidetector CT imaging of the  abdomen and pelvis was performed following the standard protocol without oral or IV contrast. COMPARISON:  March 26, 2019 FINDINGS: Lower chest: Lung bases are clear. There are foci of coronary artery calcification. Hepatobiliary: No focal liver lesions are evident on this noncontrast enhanced study. Gallbladder wall is not appreciably thickened. There is no biliary duct dilatation. Pancreas: There is no pancreatic mass or inflammatory focus. Spleen: No splenic lesions are evident. Adrenals/Urinary Tract: Adrenals bilaterally appear normal. Right kidney is subtly edematous. There is a cyst in the mid left kidney measuring 1.2 x 1.0 cm, better seen on prior contrast enhanced study. There is mild hydronephrosis on the right. There is no appreciable hydronephrosis on the left. There is a 3 x 2 mm calculus in the lower pole of the right kidney. There is a 4 x 3 mm calculus in the lower pole of the left kidney. There is a calculus in the proximal right ureter at the L3-4 level measuring 7 x 5 mm. No other ureteral calculi are evident. Urinary bladder is midline with wall thickness within normal limits. Stomach/Bowel: There is no appreciable bowel wall or mesenteric thickening. There is moderate stool in the colon. Note that most small bowel loops are fluid-filled. There is no demonstrable bowel obstruction. The terminal ileum appears unremarkable. There is no evident free air or portal venous air. Vascular/Lymphatic: There is aortic atherosclerosis. There is calcification in the proximal right renal artery. There is also mild calcification in the left common iliac artery. There is no evident abdominal aortic aneurysm. No adenopathy evident in the abdomen or pelvis. Reproductive: There are a few prostatic calculi. Prostate and seminal vesicles are normal in size and contour. No pelvic mass evident. Other: Appendix appears normal. There is no evident abscess or ascites in the abdomen or pelvis. There is fat in each  inguinal ring. Musculoskeletal: No blastic or lytic bone lesions. There is degenerative change in the thoracic spine. No intramuscular lesions are evident. IMPRESSION: 1. 7 x 5 mm calculus in the proximal right ureter at the L3-4 level with mild hydronephrosis and mild right renal edema. 2.  Nonobstructing calculus in the lower pole of each kidney. 3. Fluid throughout most small bowel loops, a finding that may indicate a degree of enteritis or ileus. Ileus secondary to the obstructing calculus on the right is quite possible. No bowel obstruction evident. 4.  No abscess.  Appendix appears normal. 5. Aortic Atherosclerosis (ICD10-I70.0). There is also calcification in the right proximal renal artery as well as foci of coronary artery calcification. Electronically Signed   By: Lowella Grip III M.D.   On: 02/07/2020 08:42     Assessment and Recommendation  49 y.o. male with known essential hypertension mixed hyperlipidemia coronary artery disease status post previous stent placement with a non-ST elevation myocardial infarction 1.  Continue heparin aspirin for further risk reduction of myocardial infarction 2.  Continue nitrate as necessary topically for chest discomfort if there is a reoccurrence 3.  Further medication management for hypertension control 4.  High intensity cholesterol therapy 5.  Continue supportive therapy and treatment of pain from kidney stone 6.  Further intervention including cardiac catheterization to assess coronary anatomy and further treatment thereof is necessary.  Patient understands the risk and benefits of cardiac catheterization.  This includes the possibility death stroke heart attack infection bleeding or blood clot.  He is at low risk for conscious sedation  Signed, Serafina Royals M.D. FACC

## 2020-02-09 NOTE — Progress Notes (Signed)
Follow up with this patient from last shift. Patient has just come through a rough few minutes where the medical team had to administer care. Wife at bedside, I stayed with  Them for a  Period, had prayer will continue to follow up.

## 2020-02-09 NOTE — Progress Notes (Addendum)
PROGRESS NOTE    John Hartman  [id number] DOB: 05-27-1971 DOA: 02/07/2020  PCP: [name], FNP    LOS - 2   Brief Narrative:  John Hartman a 49 y.o.malewith medical history significant ofhypertension, hyperlipidemia, diabetes mellitus, GERD, gout, CAD, myocardial infarction,sCHF with EF 35-40%, who presented to the ED on 2/12 with right flank pain of sudden onset earlier that morning.  He also had an episode of substernal chest pain with radiation to the shoulder that had resolved prior to coming to the ED.  In the ED patient's initial troponin was 10, however repeats were significantly elevated up to 9920.  Cardiology was consulted.  CT renal stone study showed a 7 x 5 mm calculus in the proximal right ureter and mild hydronephrosis and renal edema.  Urology was consulted.  Patient admitted for further evaluation management.  Subjective 2/14: Patient seen and examined this morning.  No acute events reported overnight.  Since he has had a couple of twinges of left-sided chest pain that lasted seconds.  Headache did resolve yesterday, but does come and go a little.  He attributes to medications.  Pain adequately controlled with Dilaudid and Toradol.  Denies fevers, chills, nausea vomiting diarrhea or other acute complaints.  Assessment & Plan:   Principal Problem:   NSTEMI (non-ST elevated myocardial infarction) ([place]) Active Problems:   Essential hypertension   Coronary artery disease   Hyperlipidemia LDL goal <70   Right ureteral stone   Diabetes mellitus without complication (HCC)   Chronic systolic CHF (congestive heart failure) (HCC)   Non-STEMI History of coronary artery disease Elevated troponins peaked at 17,310, last repeat 7799.  No active chest pain --Cardiology following, Dr. Nehemiah Massed --Plan for left heart cath on Monday --Continue heparin drip --Continue aspirin statin beta-blocker  Chronic systolic CHF -currently well compensated and euvolemic Echo in  August 2019 showed EF 35 to 40%, outside echo in March 2020 EF 50%.   Echo this admission shows EF 40 to 45%, prior to heart cath. --Continue aspirin beta-blocker hydralazine --Allergic to ACE inhibitors  Right ureteral stone -in addition to stones in the poles of both kidneys. --Urology following, Dr. Diamantina Providence --Intervention on hold at least until after cardiac cath --Pain control for now -IV Dilaudid and Toradol --IV fluid --Flomax --Strain urine  Acute Kidney Injury - new.  Suspect due to getting Toradol past couple days.  Is getting IV fluids. Cr 1.40 this AM (from 1.01 on admission).   --stop Toradol --slightly increase IV fluid rate to 100 cc/hr to better hydrate prior to cath tomorrow --monitor volume status given reduced EF  Headache, acute -possibly due to nitroglycerin, morphine can also cause headaches. Very short period of relief after morphine and Toradol were given this morning. --Fioricet as needed if not controlled with pain meds above  Essential hypertension -chronic and currently uncontrolled, likely due to pain and getting Toradol. Home meds appear to be hydralazine 25 mg twice daily, Lopressor 25 mg twice daily. Note allergy to ACE inhibitor's. --Continue home hydralazine, Lopressor  --Continue amlodipine (added) --PO hydralazine PRN --IV labetalol PRN --Monitor BP closely and watch for any signs of end-organ damage  Hyperlipidemia -goal LDL less than 70 --Continue home statin  Type 2 diabetes  Last A1c 7.2%, poorly controlled.   Home regimen is Rybelsus (semaglutide) and Xigduo-XR (Dapagliflozin-metFORMIN). --Hold home meds --Sliding scale NovoLog --Continue Lyrica for neuropathy  Gout -not acutely flared. -Continue allopurinol  Continue other home meds: B12, Zyrtec, Zanaflex, ophthalmic drops, melatonin, Benadryl  Hold home meds: Celecoxib, buprenorphine   DVT prophylaxis: On heparin drip   Code Status: Full Code  Family Communication:  Wife at bedside  Disposition Plan: Expect discharge to prior home environment pending further evaluation and clinical improvement, clearance by cardiology and urology. Coming From home Exp DC Date to be determined, expect earliest 2/16-17 Barriers as above Medically Stable for Discharge?  No  Consultants:   Cardiology, Dr. Nehemiah Massed  Urology, Dr. Diamantina Providence  Procedures:   None  Antimicrobials:   None   Objective: Vitals:   02/08/20 1725 02/08/20 2025 02/09/20 0417 02/09/20 0738  BP: (!) 145/104 (!) 139/99 (!) 131/97 (!) 135/96  Pulse: 83 94 85 72  Resp: 18 20 16 17   Temp:  98.6 F (37 C) 98.1 F (36.7 C) (!) 97.5 F (36.4 C)  TempSrc:  Oral Oral Oral  SpO2: 99% 98% 97% 98%  Weight:   90.9 kg   Height:        Intake/Output Summary (Last 24 hours) at 02/09/2020 0801 Last data filed at 02/09/2020 0011 Gross per 24 hour  Intake 2356.57 ml  Output 2350 ml  Net 6.57 ml   Filed Weights   02/07/20 0756 02/09/20 0417  Weight: 88.5 kg 90.9 kg    Examination:  General exam: awake, alert, no acute distress Respiratory system: CTAB, no wheezes, rales or rhonchi, normal respiratory effort. Cardiovascular system: normal S1/S2, RRR, no JVD, murmurs, rubs, gallops, no pedal edema.   Gastrointestinal system: soft, NT, ND, no HSM felt, +bowel sounds. Central nervous system: A&O x4. no gross focal neurologic deficits, normal speech Extremities: moves all, no edema, normal tone Psychiatry: normal mood, congruent affect, judgement and insight appear normal    Data Reviewed: I have personally reviewed following labs and imaging studies  CBC: Recent Labs  Lab 02/07/20 0806 02/08/20 0207 02/09/20 0533  WBC 9.5 11.0* 7.7  HGB 16.6 15.8 14.8  HCT 51.0 48.8 46.5  MCV 80.6 80.4 81.7  PLT 221 174 [id number]*   Basic Metabolic Panel: Recent Labs  Lab 02/07/20 0806 02/09/20 0533  NA 139 139  K 4.0 3.7  CL 103 105  CO2 20* 28  GLUCOSE 190* 139*  BUN 17 15  CREATININE  1.01 1.40*  CALCIUM 9.8 8.8*   GFR: Estimated Creatinine Clearance: 76 mL/min (A) (by C-G formula based on SCr of 1.4 mg/dL (H)). Liver Function Tests: No results for input(s): AST, ALT, ALKPHOS, BILITOT, PROT, ALBUMIN in the last 168 hours. No results for input(s): LIPASE, AMYLASE in the last 168 hours. No results for input(s): AMMONIA in the last 168 hours. Coagulation Profile: Recent Labs  Lab 02/07/20 1438  INR 1.1   Cardiac Enzymes: No results for input(s): CKTOTAL, CKMB, CKMBINDEX, TROPONINI in the last 168 hours. BNP (last 3 results) No results for input(s): PROBNP in the last 8760 hours. HbA1C: Recent Labs    02/08/20 0207  HGBA1C 6.4*   CBG: Recent Labs  Lab 02/08/20 0804 02/08/20 1219 02/08/20 1727 02/08/20 2103 02/09/20 0735  GLUCAP 129* 153* 130* 137* 124*   Lipid Profile: Recent Labs    02/08/20 0207  CHOL 145  HDL 33*  LDLCALC 95  TRIG 84  CHOLHDL 4.4   Thyroid Function Tests: No results for input(s): TSH, T4TOTAL, FREET4, T3FREE, THYROIDAB in the last 72 hours. Anemia Panel: No results for input(s): VITAMINB12, FOLATE, FERRITIN, TIBC, IRON, RETICCTPCT in the last 72 hours. Sepsis Labs: No results for input(s): PROCALCITON, LATICACIDVEN in the last 168 hours.  Recent Results (  from the past 240 hour(s))  Respiratory Panel by RT PCR (Flu A&B, Covid) - Nasopharyngeal Swab     Status: None   Collection Time: 02/07/20  2:38 PM   Specimen: Nasopharyngeal Swab  Result Value Ref Range Status   SARS Coronavirus 2 by RT PCR NEGATIVE NEGATIVE Final    Comment: (NOTE) SARS-CoV-2 target nucleic acids are NOT DETECTED. The SARS-CoV-2 RNA is generally detectable in upper respiratoy specimens during the acute phase of infection. The lowest concentration of SARS-CoV-2 viral copies this assay can detect is 131 copies/mL. A negative result does not preclude SARS-Cov-2 infection and should not be used as the sole basis for treatment or other patient  management decisions. A negative result may occur with  improper specimen collection/handling, submission of specimen other than nasopharyngeal swab, presence of viral mutation(s) within the areas targeted by this assay, and inadequate number of viral copies (<131 copies/mL). A negative result must be combined with clinical observations, patient history, and epidemiological information. The expected result is Negative. Fact Sheet for Patients:  PinkCheek.be Fact Sheet for Healthcare Providers:  GravelBags.it This test is not yet ap proved or cleared by the [place] FDA and  has been authorized for detection and/or diagnosis of SARS-CoV-2 by FDA under an Emergency Use Authorization (EUA). This EUA will remain  in effect (meaning this test can be used) for the duration of the COVID-19 declaration under Section 564(b)(1) of the Act, 21 U.S.C. section 360bbb-3(b)(1), unless the authorization is terminated or revoked sooner.    Influenza A by PCR NEGATIVE NEGATIVE Final   Influenza B by PCR NEGATIVE NEGATIVE Final    Comment: (NOTE) The Xpert Xpress SARS-CoV-2/FLU/RSV assay is intended as an aid in  the diagnosis of influenza from Nasopharyngeal swab specimens and  should not be used as a sole basis for treatment. Nasal washings and  aspirates are unacceptable for Xpert Xpress SARS-CoV-2/FLU/RSV  testing. Fact Sheet for Patients: PinkCheek.be Fact Sheet for Healthcare Providers: GravelBags.it This test is not yet approved or cleared by the [place] FDA and  has been authorized for detection and/or diagnosis of SARS-CoV-2 by  FDA under an Emergency Use Authorization (EUA). This EUA will remain  in effect (meaning this test can be used) for the duration of the  Covid-19 declaration under Section 564(b)(1) of the Act, 21  U.S.C. section 360bbb-3(b)(1), unless the  authorization is  terminated or revoked. Performed at [facility], [address], [place], [place] [address]          Radiology Studies: DG Abdomen 1 View  Result Date: 02/07/2020 CLINICAL DATA:  Right flank pain. EXAM: ABDOMEN - 1 VIEW COMPARISON:  November 27, 2019. FINDINGS: The bowel gas pattern is normal. Bilateral nephrolithiasis is noted. Left ureteral stent is not visualized and has been removed. IMPRESSION: Bilateral nephrolithiasis. Left ureteral stent has been removed. No evidence of bowel obstruction or ileus. Electronically Signed   By: Marijo Conception M.D.   On: 02/07/2020 12:20   ECHOCARDIOGRAM COMPLETE  Result Date: 02/09/2020    ECHOCARDIOGRAM REPORT   Patient Name:   [name] Strzelecki Date of Exam: 02/08/2020 Medical Rec #:  [id number]   Height:       71.0 in Accession #:    [id number]  Weight:       195.0 lb Date of Birth:  1971-06-27    BSA:          2.09 m Patient Age:    47 years    BP:  177/123 mmHg Patient Gender: M           HR:           91 bpm. Exam Location:  [facility] Procedure: 2D Echo Indications:     NSTEMI I21.4  History:         Patient has prior history of Echocardiogram examinations, most                  recent 08/24/2018. CHF, CAD; Risk Factors:Hypertension and                  Dyslipidemia.  Sonographer:     Avanell Shackleton Referring Phys:  Unknown Foley NIU Diagnosing Phys: Serafina Royals MD IMPRESSIONS  1. Left ventricular ejection fraction, by estimation, is 40 to 45%. The left ventricle has mildly decreased function. The left ventricle demonstrates global hypokinesis. Left ventricular diastolic parameters were normal.  2. Right ventricular systolic function is normal. The right ventricular size is normal.  3. The mitral valve is normal in structure and function. Mild mitral valve regurgitation.  4. The aortic valve is normal in structure and function. Aortic valve regurgitation is not visualized. FINDINGS  Left Ventricle: Left ventricular ejection  fraction, by estimation, is 40 to 45%. The left ventricle has mildly decreased function. The left ventricle demonstrates global hypokinesis. There is no left ventricular hypertrophy. Left ventricular diastolic parameters were normal. Right Ventricle: The right ventricular size is normal. No increase in right ventricular wall thickness. Right ventricular systolic function is normal. Left Atrium: Left atrial size was normal in size. Right Atrium: Right atrial size was normal in size. Pericardium: There is no evidence of pericardial effusion. Mitral Valve: The mitral valve is normal in structure and function. Mild mitral valve regurgitation. Tricuspid Valve: The tricuspid valve is normal in structure. Tricuspid valve regurgitation is trivial. Aortic Valve: The aortic valve is normal in structure and function. Aortic valve regurgitation is not visualized. Pulmonic Valve: The pulmonic valve was normal in structure. Pulmonic valve regurgitation is not visualized. Aorta: The aortic root and ascending aorta are structurally normal, with no evidence of dilitation. IAS/Shunts: No atrial level shunt detected by color flow Doppler.  LEFT VENTRICLE PLAX 2D LVIDd:         4.31 cm LVIDs:         3.11 cm LV PW:         1.14 cm LV IVS:        1.59 cm LVOT diam:     2.20 cm LV SV Index:   21.38 LVOT Area:     3.80 cm  LEFT ATRIUM             Index       RIGHT ATRIUM           Index LA diam:        4.60 cm 2.21 cm/m  RA Area:     21.80 cm LA Vol (A2C):   53.5 ml 25.65 ml/m RA Volume:   67.90 ml  32.55 ml/m LA Vol (A4C):   46.4 ml 22.24 ml/m LA Biplane Vol: 52.5 ml 25.17 ml/m   AORTA Ao Root diam: 3.50 cm MITRAL VALVE MV Area (PHT): 2.54 cm    SHUNTS MV Decel Time: 299 msec    Systemic Diam: 2.20 cm MV E velocity: 52.40 cm/s MV A velocity: 84.40 cm/s MV E/A ratio:  0.62 Serafina Royals MD Electronically signed by Serafina Royals MD Signature Date/Time: 02/09/2020/6:26:29 AM    Final  CT Renal Stone Study  Result Date:  02/07/2020 CLINICAL DATA:  And flank pain EXAM: CT ABDOMEN AND PELVIS WITHOUT CONTRAST TECHNIQUE: Multidetector CT imaging of the abdomen and pelvis was performed following the standard protocol without oral or IV contrast. COMPARISON:  March 26, 2019 FINDINGS: Lower chest: Lung bases are clear. There are foci of coronary artery calcification. Hepatobiliary: No focal liver lesions are evident on this noncontrast enhanced study. Gallbladder wall is not appreciably thickened. There is no biliary duct dilatation. Pancreas: There is no pancreatic mass or inflammatory focus. Spleen: No splenic lesions are evident. Adrenals/Urinary Tract: Adrenals bilaterally appear normal. Right kidney is subtly edematous. There is a cyst in the mid left kidney measuring 1.2 x 1.0 cm, better seen on prior contrast enhanced study. There is mild hydronephrosis on the right. There is no appreciable hydronephrosis on the left. There is a 3 x 2 mm calculus in the lower pole of the right kidney. There is a 4 x 3 mm calculus in the lower pole of the left kidney. There is a calculus in the proximal right ureter at the L3-4 level measuring 7 x 5 mm. No other ureteral calculi are evident. Urinary bladder is midline with wall thickness within normal limits. Stomach/Bowel: There is no appreciable bowel wall or mesenteric thickening. There is moderate stool in the colon. Note that most small bowel loops are fluid-filled. There is no demonstrable bowel obstruction. The terminal ileum appears unremarkable. There is no evident free air or portal venous air. Vascular/Lymphatic: There is aortic atherosclerosis. There is calcification in the proximal right renal artery. There is also mild calcification in the left common iliac artery. There is no evident abdominal aortic aneurysm. No adenopathy evident in the abdomen or pelvis. Reproductive: There are a few prostatic calculi. Prostate and seminal vesicles are normal in size and contour. No pelvic mass  evident. Other: Appendix appears normal. There is no evident abscess or ascites in the abdomen or pelvis. There is fat in each inguinal ring. Musculoskeletal: No blastic or lytic bone lesions. There is degenerative change in the thoracic spine. No intramuscular lesions are evident. IMPRESSION: 1. 7 x 5 mm calculus in the proximal right ureter at the L3-4 level with mild hydronephrosis and mild right renal edema. 2.  Nonobstructing calculus in the lower pole of each kidney. 3. Fluid throughout most small bowel loops, a finding that may indicate a degree of enteritis or ileus. Ileus secondary to the obstructing calculus on the right is quite possible. No bowel obstruction evident. 4.  No abscess.  Appendix appears normal. 5. Aortic Atherosclerosis (ICD10-I70.0). There is also calcification in the right proximal renal artery as well as foci of coronary artery calcification. Electronically Signed   By: Lowella Grip III M.D.   On: 02/07/2020 08:42        Scheduled Meds: . allopurinol  100 mg Oral Daily  . amLODipine  10 mg Oral Daily  . aspirin EC  81 mg Oral Daily  . atorvastatin  80 mg Oral Q supper  . diphenhydrAMINE  50 mg Oral QHS  . hydrALAZINE  25 mg Oral BID  . insulin aspart  0-5 Units Subcutaneous QHS  . insulin aspart  0-9 Units Subcutaneous TID WC  . ketorolac  15 mg Intravenous Q6H  . loratadine  10 mg Oral Daily  . Melatonin  10 mg Oral QHS  . metoprolol tartrate  25 mg Oral BID  . neomycin-polymyxin b-dexamethasone  1 drop Left Eye Daily  .  pregabalin  50 mg Oral TID  . sodium chloride flush  3 mL Intravenous Q12H  . tamsulosin  0.4 mg Oral QPC supper  . vitamin B-12  1,000 mcg Oral Once per day on Mon Wed Fri   Continuous Infusions: . sodium chloride 75 mL/hr at 02/08/20 2254  . heparin 1,200 Units/hr (02/09/20 0432)     LOS: 2 days    Time spent: 25 minutes    Ezekiel Slocumb, DO [facility]   If 7PM-7AM, please contact  night-coverage [email or web address] 02/09/2020, 8:01 AM

## 2020-02-10 ENCOUNTER — Inpatient Hospital Stay: Payer: No Typology Code available for payment source

## 2020-02-10 ENCOUNTER — Inpatient Hospital Stay: Payer: No Typology Code available for payment source | Admitting: Anesthesiology

## 2020-02-10 ENCOUNTER — Encounter
Admission: EM | Disposition: A | Discharge status: Home / Self Care | Payer: Self-pay | Source: Home / Self Care | Attending: Internal Medicine

## 2020-02-10 ENCOUNTER — Encounter: Payer: Self-pay | Admitting: Internal Medicine

## 2020-02-10 DIAGNOSIS — E119 Type 2 diabetes mellitus without complications: Secondary | ICD-10-CM

## 2020-02-10 DIAGNOSIS — N201 Calculus of ureter: Secondary | ICD-10-CM

## 2020-02-10 HISTORY — PX: CYSTOSCOPY/URETEROSCOPY/HOLMIUM LASER: SHX6545

## 2020-02-10 HISTORY — PX: CYSTOSCOPY W/ RETROGRADES: SHX1426

## 2020-02-10 HISTORY — PX: LEFT HEART CATH AND CORONARY ANGIOGRAPHY: CATH118249

## 2020-02-10 LAB — BASIC METABOLIC PANEL
Anion gap: 9 (ref 5–15)
BUN: 12 mg/dL (ref 6–20)
CO2: 25 mmol/L (ref 22–32)
Calcium: 9.2 mg/dL (ref 8.9–10.3)
Chloride: 104 mmol/L (ref 98–111)
Creatinine, Ser: 1.32 mg/dL — ABNORMAL HIGH (ref 0.61–1.24)
GFR calc Af Amer: >60 mL/min (ref >60)
GFR calc non Af Amer: >60 mL/min (ref >60)
Glucose, Bld: 144 mg/dL — ABNORMAL HIGH (ref 70–99)
Potassium: 4.1 mmol/L (ref 3.5–5.1)
Sodium: 138 mmol/L (ref 135–145)

## 2020-02-10 LAB — GLUCOSE, CAPILLARY
Glucose-Capillary: 138 mg/dL — ABNORMAL HIGH (ref 70–99)
Glucose-Capillary: 121 mg/dL — ABNORMAL HIGH (ref 70–99)
Glucose-Capillary: 133 mg/dL — ABNORMAL HIGH (ref 70–99)
Glucose-Capillary: 135 mg/dL — ABNORMAL HIGH (ref 70–99)
Glucose-Capillary: 151 mg/dL — ABNORMAL HIGH (ref 70–99)
Glucose-Capillary: 246 mg/dL — ABNORMAL HIGH (ref 70–99)

## 2020-02-10 LAB — CBC
HCT: 48.7 % (ref 39.0–52.0)
Hemoglobin: 16 g/dL (ref 13.0–17.0)
MCH: 26.9 pg (ref 26.0–34.0)
MCHC: 32.9 g/dL (ref 30.0–36.0)
MCV: 82 fL (ref 80.0–100.0)
Platelets: 167 10*3/uL (ref 150–400)
RBC: 5.94 MIL/uL — ABNORMAL HIGH (ref 4.22–5.81)
RDW: 14.3 % (ref 11.5–15.5)
WBC: 8.2 10*3/uL (ref 4.0–10.5)
nRBC: 0 % (ref 0.0–0.2)

## 2020-02-10 LAB — MAGNESIUM: Magnesium: 2 mg/dL (ref 1.7–2.4)

## 2020-02-10 LAB — HEPARIN LEVEL (UNFRACTIONATED): Heparin Unfractionated: 0.3 IU/mL (ref 0.30–0.70)

## 2020-02-10 SURGERY — LEFT HEART CATH AND CORONARY ANGIOGRAPHY
Anesthesia: Moderate Sedation

## 2020-02-10 SURGERY — CYSTOURETEROSCOPY, USING HOLMIUM LASER
Anesthesia: General

## 2020-02-10 MED ORDER — FENTANYL CITRATE (PF) 100 MCG/2ML IJ SOLN: 25.0000 ug | INTRAMUSCULAR | Status: DC | PRN

## 2020-02-10 MED ORDER — DEXAMETHASONE SODIUM PHOSPHATE 10 MG/ML IJ SOLN
INTRAMUSCULAR | Status: DC | PRN
  Administered 2020-02-10: 10 mg via INTRAVENOUS

## 2020-02-10 MED ORDER — PROPOFOL 10 MG/ML IV BOLUS
INTRAVENOUS | Status: DC | PRN
  Administered 2020-02-10: 160 mg via INTRAVENOUS

## 2020-02-10 MED ORDER — IOHEXOL 180 MG/ML  SOLN
INTRAMUSCULAR | Status: DC | PRN
  Administered 2020-02-10: 20 mL

## 2020-02-10 MED ORDER — FENTANYL CITRATE (PF) 100 MCG/2ML IJ SOLN
INTRAMUSCULAR | Status: AC
  Filled 2020-02-10: qty 2

## 2020-02-10 MED ORDER — SUGAMMADEX SODIUM 500 MG/5ML IV SOLN
INTRAVENOUS | Status: DC | PRN
  Administered 2020-02-10: 200 mg via INTRAVENOUS

## 2020-02-10 MED ORDER — SODIUM CHLORIDE 0.9 % WEIGHT BASED INFUSION
92.1000 mL/h | INTRAVENOUS | Status: AC
  Administered 2020-02-10: 92.1 mL/h via INTRAVENOUS

## 2020-02-10 MED ORDER — PROPOFOL 10 MG/ML IV BOLUS
INTRAVENOUS | Status: AC
  Filled 2020-02-10: qty 20

## 2020-02-10 MED ORDER — PROMETHAZINE HCL 25 MG/ML IJ SOLN: 6.2500 mg | INTRAMUSCULAR | Status: DC | PRN

## 2020-02-10 MED ORDER — ONDANSETRON HCL 4 MG/2ML IJ SOLN
4.0000 mg | Freq: Four times a day (QID) | INTRAMUSCULAR | Status: DC | PRN
  Administered 2020-02-11: 4 mg via INTRAVENOUS
  Filled 2020-02-10: qty 2

## 2020-02-10 MED ORDER — MIDAZOLAM HCL 2 MG/2ML IJ SOLN
INTRAMUSCULAR | Status: AC
  Filled 2020-02-10: qty 2

## 2020-02-10 MED ORDER — FENTANYL CITRATE (PF) 100 MCG/2ML IJ SOLN
INTRAMUSCULAR | Status: DC | PRN
  Administered 2020-02-10 (×2): 50 ug via INTRAVENOUS

## 2020-02-10 MED ORDER — HEPARIN SODIUM (PORCINE) 1000 UNIT/ML IJ SOLN
INTRAMUSCULAR | Status: AC
  Filled 2020-02-10: qty 1

## 2020-02-10 MED ORDER — TRIAMCINOLONE 0.1 % CREAM:EUCERIN CREAM 1:1
TOPICAL_CREAM | Freq: Three times a day (TID) | CUTANEOUS | Status: DC
  Filled 2020-02-10 (×2): qty 1

## 2020-02-10 MED ORDER — HYDRALAZINE HCL 20 MG/ML IJ SOLN
INTRAMUSCULAR | Status: AC
  Filled 2020-02-10: qty 1

## 2020-02-10 MED ORDER — CEFAZOLIN SODIUM-DEXTROSE 2-3 GM-%(50ML) IV SOLR
INTRAVENOUS | Status: DC | PRN
  Administered 2020-02-10: 2 g via INTRAVENOUS

## 2020-02-10 MED ORDER — OXYCODONE-ACETAMINOPHEN 5-325 MG PO TABS
ORAL_TABLET | ORAL | Status: AC
  Filled 2020-02-10: qty 1

## 2020-02-10 MED ORDER — ACETAMINOPHEN 325 MG PO TABS: 650.0000 mg | ORAL_TABLET | ORAL | Status: DC | PRN

## 2020-02-10 MED ORDER — HEPARIN (PORCINE) IN NACL 1000-0.9 UT/500ML-% IV SOLN
INTRAVENOUS | Status: AC
  Filled 2020-02-10: qty 1000

## 2020-02-10 MED ORDER — ENOXAPARIN SODIUM 40 MG/0.4ML ~~LOC~~ SOLN
40.0000 mg | SUBCUTANEOUS | Status: DC
  Administered 2020-02-11 – 2020-02-12 (×2): 40 mg via SUBCUTANEOUS
  Filled 2020-02-10 (×2): qty 0.4

## 2020-02-10 MED ORDER — HYDRALAZINE HCL 20 MG/ML IJ SOLN
10.0000 mg | INTRAMUSCULAR | Status: DC | PRN
  Administered 2020-02-10: 10 mg via INTRAVENOUS

## 2020-02-10 MED ORDER — HYDRALAZINE HCL 20 MG/ML IJ SOLN: 10.0000 mg | INTRAMUSCULAR | Status: AC | PRN

## 2020-02-10 MED ORDER — FENTANYL CITRATE (PF) 100 MCG/2ML IJ SOLN
INTRAMUSCULAR | Status: DC | PRN
  Administered 2020-02-10: 50 ug via INTRAVENOUS

## 2020-02-10 MED ORDER — ACETAMINOPHEN 10 MG/ML IV SOLN
INTRAVENOUS | Status: DC | PRN
  Administered 2020-02-10: 1000 mg via INTRAVENOUS

## 2020-02-10 MED ORDER — VERAPAMIL HCL 2.5 MG/ML IV SOLN
INTRAVENOUS | Status: AC
  Filled 2020-02-10: qty 2

## 2020-02-10 MED ORDER — DERMACERIN EX CREA
1.0000 "application " | TOPICAL_CREAM | Freq: Three times a day (TID) | CUTANEOUS | Status: DC
  Administered 2020-02-10 – 2020-02-12 (×6): 1 via CUTANEOUS
  Filled 2020-02-10 (×2): qty 107

## 2020-02-10 MED ORDER — MIDAZOLAM HCL 2 MG/2ML IJ SOLN
INTRAMUSCULAR | Status: DC | PRN
  Administered 2020-02-10: 1 mg via INTRAVENOUS

## 2020-02-10 MED ORDER — SEVOFLURANE IN SOLN
RESPIRATORY_TRACT | Status: AC
  Filled 2020-02-10: qty 250

## 2020-02-10 MED ORDER — ISOSORBIDE MONONITRATE ER 30 MG PO TB24
30.0000 mg | ORAL_TABLET | Freq: Every day | ORAL | Status: DC
  Administered 2020-02-10 – 2020-02-12 (×3): 30 mg via ORAL
  Filled 2020-02-10 (×3): qty 1

## 2020-02-10 MED ORDER — SUCCINYLCHOLINE CHLORIDE 20 MG/ML IJ SOLN
INTRAMUSCULAR | Status: DC | PRN
  Administered 2020-02-10: 120 mg via INTRAVENOUS

## 2020-02-10 MED ORDER — ONDANSETRON HCL 4 MG/2ML IJ SOLN
INTRAMUSCULAR | Status: DC | PRN
  Administered 2020-02-10: 4 mg via INTRAVENOUS

## 2020-02-10 MED ORDER — MIDAZOLAM HCL 2 MG/2ML IJ SOLN
INTRAMUSCULAR | Status: DC | PRN
  Administered 2020-02-10: 2 mg via INTRAVENOUS

## 2020-02-10 MED ORDER — HYDROMORPHONE HCL 1 MG/ML IJ SOLN
INTRAMUSCULAR | Status: AC
  Filled 2020-02-10: qty 1

## 2020-02-10 MED ORDER — IOHEXOL 300 MG/ML  SOLN
INTRAMUSCULAR | Status: DC | PRN
  Administered 2020-02-10: 105 mL

## 2020-02-10 MED ORDER — HEPARIN SODIUM (PORCINE) 1000 UNIT/ML IJ SOLN
INTRAMUSCULAR | Status: DC | PRN
  Administered 2020-02-10: 4500 [IU] via INTRAVENOUS

## 2020-02-10 MED ORDER — TRIAMCINOLONE ACETONIDE 0.1 % EX CREA
TOPICAL_CREAM | Freq: Three times a day (TID) | CUTANEOUS | Status: DC
  Administered 2020-02-10: 1 via TOPICAL
  Filled 2020-02-10: qty 15

## 2020-02-10 MED ORDER — ACETAMINOPHEN 10 MG/ML IV SOLN
INTRAVENOUS | Status: AC
  Filled 2020-02-10: qty 100

## 2020-02-10 MED ORDER — LABETALOL HCL 5 MG/ML IV SOLN: 10.0000 mg | INTRAVENOUS | Status: AC | PRN

## 2020-02-10 MED ORDER — LACTATED RINGERS IV SOLN: INTRAVENOUS | Status: DC | PRN

## 2020-02-10 MED ORDER — BELLADONNA ALKALOIDS-OPIUM 16.2-60 MG RE SUPP
RECTAL | Status: AC
  Filled 2020-02-10: qty 1

## 2020-02-10 MED ORDER — VERAPAMIL HCL 2.5 MG/ML IV SOLN
INTRAVENOUS | Status: DC | PRN
  Administered 2020-02-10: 2.5 mg via INTRA_ARTERIAL

## 2020-02-10 MED ORDER — BELLADONNA ALKALOIDS-OPIUM 16.2-60 MG RE SUPP
RECTAL | Status: DC | PRN
  Administered 2020-02-10: 1 via RECTAL

## 2020-02-10 MED ORDER — KETOROLAC TROMETHAMINE 30 MG/ML IJ SOLN
INTRAMUSCULAR | Status: DC | PRN
  Administered 2020-02-10: 15 mg via INTRAVENOUS

## 2020-02-10 MED ORDER — ROCURONIUM BROMIDE 100 MG/10ML IV SOLN
INTRAVENOUS | Status: DC | PRN
  Administered 2020-02-10: 10 mg via INTRAVENOUS
  Administered 2020-02-10: 40 mg via INTRAVENOUS

## 2020-02-10 MED ORDER — HEPARIN (PORCINE) IN NACL 1000-0.9 UT/500ML-% IV SOLN
INTRAVENOUS | Status: DC | PRN
  Administered 2020-02-10: 500 mL

## 2020-02-10 SURGICAL SUPPLY — 9 items
CATH INFINITI 5 FR JL3.5 (CATHETERS) ×1 IMPLANT
CATH INFINITI 5FR ANG PIGTAIL (CATHETERS) ×1 IMPLANT
CATH INFINITI JR4 5F (CATHETERS) ×1 IMPLANT
DEVICE RAD TR BAND REGULAR (VASCULAR PRODUCTS) ×1 IMPLANT
GLIDESHEATH SLEND A-KIT 6F 22G (SHEATH) IMPLANT
GLIDESHEATH SLEND SS 6F .021 (SHEATH) ×1 IMPLANT
KIT MANI 3VAL PERCEP (MISCELLANEOUS) ×2 IMPLANT
PACK CARDIAC CATH (CUSTOM PROCEDURE TRAY) ×2 IMPLANT
WIRE ROSEN-J .035X260CM (WIRE) ×1 IMPLANT

## 2020-02-10 SURGICAL SUPPLY — 33 items
BAG DRAIN CYSTO-URO LG1000N (MISCELLANEOUS) ×3 IMPLANT
BRUSH SCRUB EZ 1% IODOPHOR (MISCELLANEOUS) ×3 IMPLANT
CATH URETL 5X70 OPEN END (CATHETERS) IMPLANT
CNTNR SPEC 2.5X3XGRAD LEK (MISCELLANEOUS)
CONT SPEC 4OZ STER OR WHT (MISCELLANEOUS)
CONTAINER SPEC 2.5X3XGRAD LEK (MISCELLANEOUS) IMPLANT
DRAPE UTILITY 15X26 TOWEL STRL (DRAPES) ×3 IMPLANT
FIBER LASER TRAC TIP (UROLOGICAL SUPPLIES) IMPLANT
FIBER LASER TRACTIP 200 (UROLOGICAL SUPPLIES) ×2 IMPLANT
GLOVE BIOGEL PI IND STRL 7.5 (GLOVE) ×3 IMPLANT
GLOVE BIOGEL PI INDICATOR 7.5 (GLOVE) ×2
GOWN STRL REUS W/ TWL LRG LVL3 (GOWN DISPOSABLE) ×2 IMPLANT
GOWN STRL REUS W/ TWL XL LVL3 (GOWN DISPOSABLE) ×2 IMPLANT
GOWN STRL REUS W/TWL LRG LVL3 (GOWN DISPOSABLE) ×1
GOWN STRL REUS W/TWL XL LVL3 (GOWN DISPOSABLE) ×1
GUIDEWIRE STR DUAL SENSOR (WIRE) ×3 IMPLANT
INFUSOR MANOMETER BAG 3000ML (MISCELLANEOUS) ×3 IMPLANT
INTRODUCER DILATOR DOUBLE (INTRODUCER) IMPLANT
KIT TURNOVER CYSTO (KITS) ×3 IMPLANT
PACK CYSTO AR (MISCELLANEOUS) ×3 IMPLANT
SCOPE LITHOVU DISP 9.5FR 7.7FR (UROLOGICAL SUPPLIES) ×1 IMPLANT
SCOPE LITHOVUE DISPOSABLE (UROLOGICAL SUPPLIES) ×1
SET CYSTO W/LG BORE CLAMP LF (SET/KITS/TRAYS/PACK) ×3 IMPLANT
SET DILATOR URETRAL 8.5FR (MISCELLANEOUS) ×2 IMPLANT
SHEATH URETERAL 12FRX35CM (MISCELLANEOUS) IMPLANT
SOL .9 NS 3000ML IRR  AL (IV SOLUTION) ×1
SOL .9 NS 3000ML IRR UROMATIC (IV SOLUTION) ×2 IMPLANT
STENT URET 6FRX24 CONTOUR (STENTS) IMPLANT
STENT URET 6FRX26 CONTOUR (STENTS) IMPLANT
SURGILUBE 2OZ TUBE FLIPTOP (MISCELLANEOUS) ×3 IMPLANT
SYR 10ML LL (SYRINGE) ×3 IMPLANT
VALVE UROSEAL ADJ ENDO (VALVE) IMPLANT
WATER STERILE IRR 1000ML POUR (IV SOLUTION) ×3 IMPLANT

## 2020-02-10 NOTE — Progress Notes (Signed)
Spoke with Dr. Diamantina Providence on phone: made aware of cath results. Pt. To remain NPO for possible procedure after 12:00 today re: kidney stone. Dr. Mitzie Na med. For skin blisters to pt. Upper chest region(s) from reaction to EKG lead adhesive. Dr. Nehemiah Massed aware.

## 2020-02-10 NOTE — Progress Notes (Signed)
Dr. Nehemiah Massed at bedside, speaking with pt. And his wife at bedside re: cath results. Both verbalized understanding of conversation.

## 2020-02-10 NOTE — Op Note (Signed)
Date of procedure: 02/10/20  Preoperative diagnosis:  1. Right proximal ureteral stone  Postoperative diagnosis:  1. Same  Procedure: 1. Cystoscopy, right retrograde pyelogram with intraoperative interpretation, right ureteroscopy, laser lithotripsy  Surgeon: Nickolas Madrid, MD  Anesthesia: General  Complications: None  Intraoperative findings:  1.  Normal cystoscopy 2.  Uncomplicated dusting of right-sided stone 3.  Brisk efflux of urine from right ureter at conclusion, no stent placed  EBL: Minimal  Specimens: None  Drains: None  Indication: John Hartman is a 49 y.o. patient with recent admission for chest pain and right flank pain who was found to have a 7 mm right proximal ureteral stone as well as an acute NSTEMI.  Cardiac catheterization this morning showed no lesions that required intervention, and he was cleared for urologic intervention for stone.    He has previously undergone contralateral ureteroscopy and did not tolerate his stent well and had numerous ER visits and severe nausea/vomiting and flank pain.  We discussed pre-operatively we would do everything possible to not leave a stent if able.  After reviewing the management options for treatment, they elected to proceed with the above surgical procedure(s). We have discussed the potential benefits and risks of the procedure, side effects of the proposed treatment, the likelihood of the patient achieving the goals of the procedure, and any potential problems that might occur during the procedure or recuperation. Informed consent has been obtained.  Description of procedure:  The patient was taken to the operating room and general anesthesia was induced. SCDs were placed for DVT prophylaxis. The patient was placed in the dorsal lithotomy position, prepped and draped in the usual sterile fashion, and preoperative antibiotics(Ancef) were administered. A preoperative time-out was performed.   A 21 French rigid cystoscope  was used to intubate the urethra.  The urethra and prostate were grossly normal-appearing.  Thorough cystoscopy was performed and the bladder was grossly normal, and the ureteral orifices orthotopic bilaterally.  There was no stone in the bladder.  On fluoroscopy, there was contrast in the bladder from his prior cardiac catheterization this morning, however there is no retained contrast in the right kidney.  I started by performing a retrograde pyelogram on the right side which showed no filling defects or hydronephrosis.  As he was having severe flank pain in the pre-op area I elected to still perform right-sided ureteroscopy.  A sensor wire was advanced up to the level of the kidney under fluoroscopic vision and passed easily without resistance.  I attempted to pass the Lithovue flexible ureteroscope over the wire, but met resistance at the ureteral orifice.  8/10 [place] dilators were used to gently dilate the distal and mid ureter to 10 [place].  The lithovue ureteroscope was then advanced again over the wire and passed easily up into the kidney without resistance.  Thorough pyeloscopy was performed and was notable for some old blood clot, as well as a 7 mm stone in the renal pelvis, and a smaller stone in the lower pole.  The 200 m laser fiber was used to fragment both stones to <67m fragments on settings of 1.0 J and 10 Hz.  Thorough cystoscopy revealed no significant residual fragments at the conclusion of the case.  A retrograde pyelogram was performed from the proximal ureter and showed no filling defects, extravasation, or hydronephrosis.  Careful pullback ureteroscopy demonstrated no residual ureteral fragments and no ureteral injury, and the ureter was widely patent.  The rigid cystoscope was replaced and there was brisk efflux  of pink urine from the right ureteral orifice.  I elected to not place a stent in the setting of his widely patent ureter, brisk efflux, and the fact that he did not tolerate  stent previously.  The bladder was drained and a belladonna suppository was placed.  Disposition: Stable to PACU  Plan: Monitor overnight Likely discharge tomorrow  Nickolas Madrid, MD

## 2020-02-10 NOTE — Progress Notes (Signed)
Pt. Med. With 1 Percocet (5-325mg  ) p.o. now for c/o "5" out of 10 Right flank "kidney" pain. Pt. Set up at bedside to urinate, then back in bed. Right radial site clean, dry, intact without any complications at site.

## 2020-02-10 NOTE — Transfer of Care (Signed)
Immediate Anesthesia Transfer of Care Note  Patient: John Hartman  Procedure(s) Performed: CYSTOSCOPY/URETEROSCOPY/HOLMIUM LASER CYSTOSCOPY WITH RETROGRADE PYELOGRAM  Patient Location: PACU  Anesthesia Type:General  Level of Consciousness: sedated  Airway & Oxygen Therapy: Patient Spontanous Breathing and Patient connected to face mask oxygen  Post-op Assessment: Report given to RN and Post -op Vital signs reviewed and stable  Post vital signs: Reviewed  Last Vitals:  Vitals Value Taken Time  BP 136/88 02/10/20 1536  Temp    Pulse 92 02/10/20 1537  Resp 15 02/10/20 1537  SpO2 99 % 02/10/20 1537  Vitals shown include unvalidated device data.  Last Pain:  Vitals:   02/10/20 1347  TempSrc: Oral  PainSc: 2       Patients Stated Pain Goal: 4 ([id number])  Complications: No apparent anesthesia complications

## 2020-02-10 NOTE — Progress Notes (Signed)
Pt. Med. With Dilaudid 1.0 mg IVP for unrelieved pain to right lower flank pain (secondary to kidney stone). States the "percocet didn't touch it." States pain is "7.5" out of 10 pain level.

## 2020-02-10 NOTE — Consult Note (Signed)
[place] for [facility] Indication: chest pain/ACS  Allergies  Allergen Reactions  . Lisinopril Swelling  . Ace Inhibitors Swelling    Patient Measurements: Height: 5\' 11"  (180.3 cm) Weight: 203 lb 1.6 oz (92.1 kg) IBW/kg (Calculated) : 75.3 Heparin Dosing Weight: 88.5 kg  Vital Signs: Temp: 98.1 F (36.7 C) (02/15 0401) Temp Source: Oral (02/15 0401) BP: 127/98 (02/15 0401) Pulse Rate: 73 (02/15 0401)  Labs: Recent Labs    02/07/20 0806 02/07/20 0806 02/07/20 0917 02/07/20 1438 02/07/20 1602 02/07/20 2015 02/07/20 2146 02/08/20 0207 02/08/20 0207 02/08/20 0834 02/09/20 0533 02/10/20 0459  HGB 16.6   < >  --   --   --   --   --  15.8   < >  --  14.8 16.0  HCT 51.0   < >  --   --   --   --   --  48.8  --   --  46.5 48.7  PLT 221   < >  --   --   --   --   --  174  --   --  149* 167  APTT  --   --   --  102*  --   --   --   --   --   --   --   --   LABPROT  --   --   --  14.0  --   --   --   --   --   --   --   --   INR  --   --   --  1.1  --   --   --   --   --   --   --   --   HEPARINUNFRC  --    < >  --   --   --  0.55  --  0.47  --   --  0.47 0.30  CREATININE 1.01  --   --   --   --   --   --   --   --   --  1.40* 1.32*  TROPONINIHS  --   --    < >  --    < > 14,856* 17,310*  --   --  [id number]*  --   --    < > = values in this interval not displayed.    Estimated Creatinine Clearance: 81.1 mL/min (A) (by C-G formula based on SCr of 1.32 mg/dL (H)).   Medical History: Past Medical History:  Diagnosis Date  . Angioedema   . Bronchitis   . DDD (degenerative disc disease), cervical   . Diabetes mellitus ([place])   . Fatty liver 03/31/2017   [place] April 2018  . GERD (gastroesophageal reflux disease)   . Gout   . Heart attack ([place])   . HTN (hypertension)   . Hypertension    CONTROLLED ON MEDS  . IBS (irritable bowel syndrome)   . Kidney stone   . LVH (left ventricular hypertrophy) 11/24/2018   Moderate, ECHO  . Myocardial  infarction ([place]) 08/23/2018  . Seasonal allergies   . Spinal stenosis     Medications:  Medications Prior to Admission  Medication Sig Dispense Refill Last Dose  . allopurinol (ZYLOPRIM) 100 MG tablet Take 100 mg by mouth daily.    02/06/2020 at Unknown time  . aspirin EC 81 MG tablet Take 81 mg by mouth daily.  02/06/2020 at Unknown time  . atorvastatin (LIPITOR) 80 MG tablet Take 1 tablet (80 mg total) by mouth daily at 6 PM. (Patient taking differently: Take 80 mg by mouth daily with supper. ) 30 tablet 0 02/06/2020 at Unknown time  . cetirizine (ZYRTEC) 10 MG tablet Take 10 mg by mouth at bedtime.   02/06/2020 at Unknown time  . Dapagliflozin-metFORMIN HCl ER (XIGDUO XR) 04-999 MG TB24 Take 2 tablets by mouth daily.    02/06/2020 at Unknown time  . diphenhydrAMINE (BENADRYL) 25 MG tablet Take 50 mg by mouth at bedtime.   02/06/2020 at Unknown time  . hydrALAZINE (APRESOLINE) 25 MG tablet Take 1 tablet (25 mg total) by mouth 2 (two) times daily. 60 tablet 0 02/06/2020 at Unknown time  . Melatonin 5 MG TABS Take 10 mg by mouth at bedtime.    02/06/2020 at Unknown time  . metoprolol tartrate (LOPRESSOR) 25 MG tablet Take 25 mg by mouth 2 (two) times daily.   02/06/2020 at Unknown time  . neomycin-polymyxin b-dexamethasone (MAXITROL) 3.5-10000-0.1 SUSP Place 1 drop into the left eye daily.   02/06/2020 at Unknown time  . pregabalin (LYRICA) 50 MG capsule Take 1 capsule (50 mg total) by mouth 3 (three) times daily. 90 capsule 2 02/06/2020 at Unknown time  . RYBELSUS 3 MG TABS Take 3 mg by mouth daily at 6 (six) AM.    02/06/2020 at Unknown time  . tiZANidine (ZANAFLEX) 4 MG tablet Take 1 tablet (4 mg total) by mouth every 8 (eight) hours as needed for muscle spasms. 90 tablet 5 02/06/2020 at Unknown time  . vitamin B-12 (CYANOCOBALAMIN) 1000 MCG tablet Take 1,000 mcg by mouth 3 (three) times a week.   02/06/2020 at Unknown time  . ACCU-CHEK SOFTCLIX LANCETS lancets USE AS DIRECTED 100 each 0   . BELBUCA 450  MCG FILM Place 1 Film inside cheek 2 (two) times daily. (Patient not taking: Reported on 02/07/2020) 60 each 1 Not Taking at Unknown time  . ezetimibe (ZETIA) 10 MG tablet Take 1 tablet (10 mg total) by mouth daily. (Patient not taking: Reported on 01/06/2020) 30 tablet 11 Not Taking at Unknown time  . ketorolac (TORADOL) 10 MG tablet Take 1 tablet (10 mg total) by mouth every 6 (six) hours as needed. (Patient not taking: Reported on 02/07/2020) 20 tablet 0 Not Taking at Unknown time  . Menthol, Topical Analgesic, (BIOFREEZE EX) Apply 1 application topically 3 (three) times daily as needed (muscle cramps/shoulder pain.).   PRN at PRN  . nitroGLYCERIN (NITROSTAT) 0.4 MG SL tablet Place 1 tablet (0.4 mg total) under the tongue every 5 (five) minutes as needed for chest pain. 30 tablet 0 PRN at PRN  . ONETOUCH VERIO test strip USE 2 (TWO) TIMES DAILY USE AS INSTRUCTED.      Scheduled:  . allopurinol  100 mg Oral Daily  . amLODipine  10 mg Oral Daily  . aspirin  81 mg Oral Pre-Cath  . aspirin EC  81 mg Oral Daily  . atorvastatin  80 mg Oral Q supper  . diphenhydrAMINE  50 mg Oral QHS  . hydrALAZINE  25 mg Oral BID  . insulin aspart  0-5 Units Subcutaneous QHS  . insulin aspart  0-9 Units Subcutaneous TID WC  . loratadine  10 mg Oral Daily  . Melatonin  10 mg Oral QHS  . metoprolol tartrate  25 mg Oral BID  . pregabalin  50 mg Oral TID  . sodium chloride flush  3 mL Intravenous Q12H  . tamsulosin  0.4 mg Oral QPC supper  . vitamin B-12  1,000 mcg Oral Once per day on Mon Wed Fri   Infusions:  . sodium chloride Stopped (02/10/20 0021)  . sodium chloride    . sodium chloride 1 mL/kg/hr (02/10/20 0229)  . heparin 1,200 Units/hr (02/10/20 0232)   PRN: sodium chloride, acetaminophen, ALPRAZolam, butalbital-acetaminophen-caffeine, hydrALAZINE, HYDROmorphone (DILAUDID) injection, labetalol, metoprolol tartrate, nitroGLYCERIN, ondansetron (ZOFRAN) IV, oxyCODONE-acetaminophen, sodium chloride flush,  tiZANidine Anti-infectives (From admission, onward)   None      Assessment: Pharmacy consulted to start heparin infusion for ACS. No DOAC PTA.   Goal of Therapy:  Heparin level 0.3-0.7 units/ml Monitor platelets by anticoagulation protocol: Yes   Plan:  02/15 @ 0459 HL 0.30. Level remains therapeutic. Platelets trending down but seem to be stablizing (221>>174>> 149>>167)  Continue current rate of 1200 units/hr and will recheck HL w/ am labs, Continue to monitor CBC daily.   Paulina Fusi, PharmD, BCPS 02/10/2020 5:47 AM

## 2020-02-10 NOTE — Anesthesia Procedure Notes (Signed)
Procedure Name: Intubation Date/Time: 02/10/2020 2:49 PM Performed by: Nelda Marseille, CRNA Pre-anesthesia Checklist: Patient identified, Patient being monitored, Timeout performed, Emergency Drugs available and Suction available Patient Re-evaluated:Patient Re-evaluated prior to induction Oxygen Delivery Method: Circle system utilized Preoxygenation: Pre-oxygenation with 100% oxygen Induction Type: IV induction Ventilation: Mask ventilation without difficulty Laryngoscope Size: Mac, 3 and McGraph Grade View: Grade I Tube type: Oral Tube size: 7.5 mm Number of attempts: 1 Airway Equipment and Method: Stylet and Video-laryngoscopy Placement Confirmation: ETT inserted through vocal cords under direct vision,  positive ETCO2 and breath sounds checked- equal and bilateral Secured at: 21 cm Tube secured with: Tape Dental Injury: Teeth and Oropharynx as per pre-operative assessment

## 2020-02-10 NOTE — Progress Notes (Signed)
[facility] Encounter Note  Patient: John Hartman / Admit Date: 02/07/2020 / Date of Encounter: 02/10/2020, 8:31 AM   Subjective: Patient has had a waxing and waning flank pain from his kidney stones which is relatively well controlled at this time and definitely improved since admission.  Patient also had chest discomfort substernally waxing and waning but not as intense as yesterday.  Currently stable on heparin.  The patient has had elevated troponin trending higher at [address] consistent with a non-ST elevation myocardial infarction.  Other medication management is improving although patient does have hypertension which may need further intervention Echocardiogram shows mild LV systolic dysfunction possibly secondary to stress-induced cardiomyopathy versus previous myocardial infarction with ejection fraction of 40% Cardiac catheterization showing minimal atherosclerosis of the 3 vessels with no evidence of critical stenosis and patent stent of mid circumflex artery Mild inferior hypokinesis with ejection fraction of 40% as before Review of Systems: Positive for: None Negative for: Vision change, hearing change, syncope, dizziness, nausea, vomiting,diarrhea, bloody stool, stomach pain, cough, congestion, diaphoresis, urinary frequency, urinary pain,skin lesions, skin rashes Others previously listed  Objective: Telemetry: Normal sinus rhythm Physical Exam: Blood pressure (!) 152/106, pulse 87, temperature 98.3 F (36.8 C), temperature source Oral, resp. rate 18, height 5\' 11"  (1.803 m), weight 92.1 kg, SpO2 97 %. Body mass index is 28.32 kg/m. General: Well developed, well nourished, in no acute distress. Head: Normocephalic, atraumatic, sclera non-icteric, no xanthomas, nares are without discharge. Neck: No apparent masses Lungs: Normal respirations with no wheezes, no rhonchi, no rales , no crackles   Heart: Regular rate and rhythm, normal S1 S2, no murmur, no rub, no  gallop, PMI is normal size and placement, carotid upstroke normal without bruit, jugular venous pressure normal Abdomen: Soft, non-tender, non-distended with normoactive bowel sounds. No hepatosplenomegaly. Abdominal aorta is normal size without bruit Extremities: No edema, no clubbing, no cyanosis, no ulcers,  Peripheral: 2+ radial, 2+ femoral, 2+ dorsal pedal pulses Neuro: Alert and oriented. Moves all extremities spontaneously. Psych:  Responds to questions appropriately with a normal affect.   Intake/Output Summary (Last 24 hours) at 02/10/2020 0831 Last data filed at 02/10/2020 0749 Gross per 24 hour  Intake 3460.17 ml  Output 2675 ml  Net 785.17 ml    Inpatient Medications:  . [MAR Hold] allopurinol  100 mg Oral Daily  . [MAR Hold] amLODipine  10 mg Oral Daily  . [MAR Hold] aspirin EC  81 mg Oral Daily  . [MAR Hold] atorvastatin  80 mg Oral Q supper  . [MAR Hold] diphenhydrAMINE  50 mg Oral QHS  . [MAR Hold] hydrALAZINE  25 mg Oral BID  . [MAR Hold] insulin aspart  0-5 Units Subcutaneous QHS  . [MAR Hold] insulin aspart  0-9 Units Subcutaneous TID WC  . [MAR Hold] loratadine  10 mg Oral Daily  . [MAR Hold] Melatonin  10 mg Oral QHS  . [MAR Hold] metoprolol tartrate  25 mg Oral BID  . [MAR Hold] pregabalin  50 mg Oral TID  . [MAR Hold] sodium chloride flush  3 mL Intravenous Q12H  . [MAR Hold] tamsulosin  0.4 mg Oral QPC supper  . [MAR Hold] vitamin B-12  1,000 mcg Oral Once per day on Mon Wed Fri   Infusions:  . sodium chloride Stopped (02/10/20 0021)  . sodium chloride    . sodium chloride 1 mL/kg/hr (02/10/20 0229)  . heparin Stopped (02/10/20 0815)    Labs: Recent Labs    02/09/20 0533 02/10/20  0459  NA 139 138  K 3.7 4.1  CL 105 104  CO2 28 25  GLUCOSE 139* 144*  BUN 15 12  CREATININE 1.40* 1.32*  CALCIUM 8.8* 9.2  MG  --  2.0   No results for input(s): AST, ALT, ALKPHOS, BILITOT, PROT, ALBUMIN in the last 72 hours. Recent Labs    02/09/20 0533  02/10/20 0459  WBC 7.7 8.2  HGB 14.8 16.0  HCT 46.5 48.7  MCV 81.7 82.0  PLT 149* 167   No results for input(s): CKTOTAL, CKMB, TROPONINI in the last 72 hours. Invalid input(s): POCBNP Recent Labs    02/08/20 0207  HGBA1C 6.4*     Weights: Filed Weights   02/09/20 0417 02/10/20 0401 02/10/20 0738  Weight: 90.9 kg 92.1 kg 92.1 kg     Radiology/Studies:  DG Abdomen 1 View  Result Date: 02/07/2020 CLINICAL DATA:  Right flank pain. EXAM: ABDOMEN - 1 VIEW COMPARISON:  November 27, 2019. FINDINGS: The bowel gas pattern is normal. Bilateral nephrolithiasis is noted. Left ureteral stent is not visualized and has been removed. IMPRESSION: Bilateral nephrolithiasis. Left ureteral stent has been removed. No evidence of bowel obstruction or ileus. Electronically Signed   By: Marijo Conception M.D.   On: 02/07/2020 12:20   ECHOCARDIOGRAM COMPLETE  Result Date: 02/09/2020    ECHOCARDIOGRAM REPORT   Patient Name:   John Hartman Date of Exam: 02/08/2020 Medical Rec #:  [id number]   Height:       71.0 in Accession #:    [id number]  Weight:       195.0 lb Date of Birth:  December 09, 1971    BSA:          2.09 m Patient Age:    49 years    BP:           177/123 mmHg Patient Gender: M           HR:           91 bpm. Exam Location:  [facility] Procedure: 2D Echo Indications:     NSTEMI I21.4  History:         Patient has prior history of Echocardiogram examinations, most                  recent 08/24/2018. CHF, CAD; Risk Factors:Hypertension and                  Dyslipidemia.  Sonographer:     Avanell Shackleton Referring Phys:  Unknown Foley NIU Diagnosing Phys: Serafina Royals MD IMPRESSIONS  1. Left ventricular ejection fraction, by estimation, is 40 to 45%. The left ventricle has mildly decreased function. The left ventricle demonstrates global hypokinesis. Left ventricular diastolic parameters were normal.  2. Right ventricular systolic function is normal. The right ventricular size is normal.  3. The mitral valve is normal in  structure and function. Mild mitral valve regurgitation.  4. The aortic valve is normal in structure and function. Aortic valve regurgitation is not visualized. FINDINGS  Left Ventricle: Left ventricular ejection fraction, by estimation, is 40 to 45%. The left ventricle has mildly decreased function. The left ventricle demonstrates global hypokinesis. There is no left ventricular hypertrophy. Left ventricular diastolic parameters were normal. Right Ventricle: The right ventricular size is normal. No increase in right ventricular wall thickness. Right ventricular systolic function is normal. Left Atrium: Left atrial size was normal in size. Right Atrium: Right atrial size was normal in size. Pericardium: There is no evidence of  pericardial effusion. Mitral Valve: The mitral valve is normal in structure and function. Mild mitral valve regurgitation. Tricuspid Valve: The tricuspid valve is normal in structure. Tricuspid valve regurgitation is trivial. Aortic Valve: The aortic valve is normal in structure and function. Aortic valve regurgitation is not visualized. Pulmonic Valve: The pulmonic valve was normal in structure. Pulmonic valve regurgitation is not visualized. Aorta: The aortic root and ascending aorta are structurally normal, with no evidence of dilitation. IAS/Shunts: No atrial level shunt detected by color flow Doppler.  LEFT VENTRICLE PLAX 2D LVIDd:         4.31 cm LVIDs:         3.11 cm LV PW:         1.14 cm LV IVS:        1.59 cm LVOT diam:     2.20 cm LV SV Index:   21.38 LVOT Area:     3.80 cm  LEFT ATRIUM             Index       RIGHT ATRIUM           Index LA diam:        4.60 cm 2.21 cm/m  RA Area:     21.80 cm LA Vol (A2C):   53.5 ml 25.65 ml/m RA Volume:   67.90 ml  32.55 ml/m LA Vol (A4C):   46.4 ml 22.24 ml/m LA Biplane Vol: 52.5 ml 25.17 ml/m   AORTA Ao Root diam: 3.50 cm MITRAL VALVE MV Area (PHT): 2.54 cm    SHUNTS MV Decel Time: 299 msec    Systemic Diam: 2.20 cm MV E velocity:  52.40 cm/s MV A velocity: 84.40 cm/s MV E/A ratio:  0.62 Serafina Royals MD Electronically signed by Serafina Royals MD Signature Date/Time: 02/09/2020/6:26:29 AM    Final    CT Renal Stone Study  Result Date: 02/07/2020 CLINICAL DATA:  And flank pain EXAM: CT ABDOMEN AND PELVIS WITHOUT CONTRAST TECHNIQUE: Multidetector CT imaging of the abdomen and pelvis was performed following the standard protocol without oral or IV contrast. COMPARISON:  March 26, 2019 FINDINGS: Lower chest: Lung bases are clear. There are foci of coronary artery calcification. Hepatobiliary: No focal liver lesions are evident on this noncontrast enhanced study. Gallbladder wall is not appreciably thickened. There is no biliary duct dilatation. Pancreas: There is no pancreatic mass or inflammatory focus. Spleen: No splenic lesions are evident. Adrenals/Urinary Tract: Adrenals bilaterally appear normal. Right kidney is subtly edematous. There is a cyst in the mid left kidney measuring 1.2 x 1.0 cm, better seen on prior contrast enhanced study. There is mild hydronephrosis on the right. There is no appreciable hydronephrosis on the left. There is a 3 x 2 mm calculus in the lower pole of the right kidney. There is a 4 x 3 mm calculus in the lower pole of the left kidney. There is a calculus in the proximal right ureter at the L3-4 level measuring 7 x 5 mm. No other ureteral calculi are evident. Urinary bladder is midline with wall thickness within normal limits. Stomach/Bowel: There is no appreciable bowel wall or mesenteric thickening. There is moderate stool in the colon. Note that most small bowel loops are fluid-filled. There is no demonstrable bowel obstruction. The terminal ileum appears unremarkable. There is no evident free air or portal venous air. Vascular/Lymphatic: There is aortic atherosclerosis. There is calcification in the proximal right renal artery. There is also mild calcification in the left common iliac  artery. There is no  evident abdominal aortic aneurysm. No adenopathy evident in the abdomen or pelvis. Reproductive: There are a few prostatic calculi. Prostate and seminal vesicles are normal in size and contour. No pelvic mass evident. Other: Appendix appears normal. There is no evident abscess or ascites in the abdomen or pelvis. There is fat in each inguinal ring. Musculoskeletal: No blastic or lytic bone lesions. There is degenerative change in the thoracic spine. No intramuscular lesions are evident. IMPRESSION: 1. 7 x 5 mm calculus in the proximal right ureter at the L3-4 level with mild hydronephrosis and mild right renal edema. 2.  Nonobstructing calculus in the lower pole of each kidney. 3. Fluid throughout most small bowel loops, a finding that may indicate a degree of enteritis or ileus. Ileus secondary to the obstructing calculus on the right is quite possible. No bowel obstruction evident. 4.  No abscess.  Appendix appears normal. 5. Aortic Atherosclerosis (ICD10-I70.0). There is also calcification in the right proximal renal artery as well as foci of coronary artery calcification. Electronically Signed   By: Lowella Grip III M.D.   On: 02/07/2020 08:42     Assessment and Recommendation  49 y.o. male with known essential hypertension mixed hyperlipidemia coronary artery disease status post previous stent placement with a non-ST elevation myocardial infarction likely secondary to stress-induced issues from a significant pain from kidney stone with malignant hypertension now resolved Already catheterization showing mild LV systolic dysfunction and diffuse three-vessel disease to a minimal degree with patent stent 1.  Discontinuation of heparin at this time a due to no evidence of critical coronary artery disease requiring further intervention 2.  Isosorbide for chest discomfort 3.  Further medication management for hypertension control as needed 4.  High intensity cholesterol therapy 5.  Continue supportive  therapy and treatment of pain from kidney stone as needed without restriction 6.  No further cardiac intervention at this time due to minimal diffuse three-vessel disease unchanged from before on appropriate medication management  Signed, Serafina Royals M.D. FACC

## 2020-02-10 NOTE — Progress Notes (Signed)
PROGRESS NOTE    John Hartman  [id number] DOB: 1971/08/17 DOA: 02/07/2020  PCP: John Hartshorn, John Hartman    LOS - 3   Brief Narrative:  John Hartman a 49 y.o.malewith medical history significant ofhypertension, hyperlipidemia, diabetes mellitus, GERD, gout, CAD, myocardial infarction,sCHF with EF 35-40%, who presentedto the ED on 2/12with right flank painof sudden onset earlier that morning.He also had an episode of substernal chest pain with radiation to the shoulder that had resolved prior to coming to the ED. In the ED patient's initial troponin was 10, however repeats were significantly elevated up to 9920. Cardiology was consulted.CT renal stone study showed a 7 x 5 mm calculus in the proximal right ureter and mild hydronephrosis and renal edema. Urology was consulted. Patient admitted for further evaluation management.  Subjective 2/16: Patient seen after heart cath this AM, wife at bedside.  Denies chest pain currently.  Has irritated skin reaction at the sites of ECG leads.  Pain controlled with dilaudid at current dose, Toradol stopped due to AKI.  Urology procedure this afternoon.  He reports anxiety due to last experience with ureteral stent.     Assessment & Plan:   Principal Problem:   NSTEMI (non-ST elevated myocardial infarction) ([place]) Active Problems:   Essential hypertension   Coronary artery disease   Hyperlipidemia LDL goal <70   Right ureteral stone   Diabetes mellitus without complication (HCC)   Chronic systolic CHF (congestive heart failure) (HCC)   Non-STEMI History of coronary artery disease Elevated troponins peaked at 17,310,last repeat 7799.  No active chest pain. Left heart cath 2/15 showed minimal atherosclerosis of the 3 vessels with no evidence of critical stenosis and patent stent of mid circumflex artery, unchanged EF of 40%. --Cardiology following, Dr. Nehemiah Hartman --Off heparin drip --Imdur for chest discomfort --High intensity statin,  ASA, beta blocker --PRN sublingual nitro   Chronic systolic CHF-currently well compensated and euvolemic Echo in August 2019 showed EF 35 to 40%, this admission EF 40 to 45%. --Continue aspirin beta-blocker hydralazine --Allergic to ACE inhibitors  Right ureteral stone-in addition to stones in the poles of both kidneys.  2/15 underwent cystoscopy, right retrograde pyelogram with intraoperative interpretation, right ureteroscopy, laser lithotripsy. No stent placed. --Urology following,Dr. Diamantina Hartman --Pain control for now-IV Dilaudid  --Resume Toradol when renal function allows (need to watch Cr after cath) --IV fluid --Flomax  Acute Kidney Injury - new.  Suspect due to getting Toradol past couple days.  Is getting IV fluids. Cr 1.40again  this AM (from 1.01 on admission).   --hold off on Toradol for now, can resume once improved --IV fluids --monitor volume status   Headache,resolved.  acute- suspect was due to nitroglycerin. --Fioricet as needed if not controlled with pain meds above  Essential hypertension-chronic and currently uncontrolled, likely due to pain. Home meds appear to be hydralazine 25 mg twice daily, Lopressor 25 mg twice daily. Noteallergy to ACE inhibitor's. --Continue home hydralazine, Lopressor  --Continueamlodipine 10 mg (new) --PO hydralazine PRN --IV labetalol PRN --Monitor BP closely and watch for any signs of end-organ damage  Hyperlipidemia-goal LDL less than 70 --Continue high intensity statin  Type 2 diabetes  Last A1c 7.2%, poorly controlled.  Home regimen isRybelsus(semaglutide)and Xigduo-XR (Dapagliflozin-metFORMIN). --Hold home meds --Sliding scale NovoLog --Continue Lyrica for neuropathy  Gout-not acutely flared. -Continue allopurinol  Continue other home meds: B12, Zyrtec, Zanaflex, ophthalmic drops, melatonin, Benadryl  Hold home meds: Celecoxib   DVT prophylaxis:Lovenox Code Status: Full Code Family  Communication:Wife at bedside  Disposition  Plan:Expect discharge to prior home environment tomorrow 2/16, pending adequate BP and pain control which is expected. Coming Fromhome Exp DC 2/16 Barriers pain control, BP Medically Stable for Discharge?No  Consultants:  Cardiology, Dr. Nehemiah Hartman  Urology, John Hartman  Procedures:  None  Antimicrobials:  None   Objective: Vitals:   02/10/20 0759 02/10/20 0835 02/10/20 0845 02/10/20 0900  BP:  (!) 132/108 (!) 137/98   Pulse:  88 83   Resp:  (!) 9 13 16   Temp:      TempSrc:      SpO2: 97% 93% 92% 90%  Weight:      Height:        Intake/Output Summary (Last 24 hours) at 02/10/2020 0915 Last data filed at 02/10/2020 0749 Gross per 24 hour  Intake 3460.17 ml  Output 2675 ml  Net 785.17 ml   Filed Weights   02/09/20 0417 02/10/20 0401 02/10/20 0738  Weight: 90.9 kg 92.1 kg 92.1 kg    Examination:  General exam: awake, alert, no acute distress HEENT: moist mucus membranes, hearing grossly normal, normal dentition  Respiratory system: CTAB, no wheezes, rales or rhonchi, normal respiratory effort. Cardiovascular system: normal S1/S2, RRR, no JVD, murmurs, rubs, gallops, no pedal edema.   Central nervous system: A&O x4. no gross focal neurologic deficits, normal speech Extremities: moves all, no edema, normal tone Skin: dry, intact, normal temperature, anterior upper chest with patches of slightly raised erythema where ECG leads were Psychiatry: normal mood, congruent affect, judgement and insight appear normal    Data Reviewed: I have personally reviewed following labs and imaging studies  CBC: Recent Labs  Lab 02/07/20 0806 02/08/20 0207 02/09/20 0533 02/10/20 0459  WBC 9.5 11.0* 7.7 8.2  HGB 16.6 15.8 14.8 16.0  HCT 51.0 48.8 46.5 48.7  MCV 80.6 80.4 81.7 82.0  PLT 221 174 149* [id number]   Basic Metabolic Panel: Recent Labs  Lab 02/07/20 0806 02/09/20 0533 02/10/20 0459  NA 139 139 138  K 4.0  3.7 4.1  CL 103 105 104  CO2 20* 28 25  GLUCOSE 190* 139* 144*  BUN 17 15 12   CREATININE 1.01 1.40* 1.32*  CALCIUM 9.8 8.8* 9.2  MG  --   --  2.0   GFR: Estimated Creatinine Clearance: 81.1 mL/min (A) (by C-G formula based on SCr of 1.32 mg/dL (H)). Liver Function Tests: No results for input(s): AST, ALT, ALKPHOS, BILITOT, PROT, ALBUMIN in the last 168 hours. No results for input(s): LIPASE, AMYLASE in the last 168 hours. No results for input(s): AMMONIA in the last 168 hours. Coagulation Profile: Recent Labs  Lab 02/07/20 1438  INR 1.1   Cardiac Enzymes: No results for input(s): CKTOTAL, CKMB, CKMBINDEX, TROPONINI in the last 168 hours. BNP (last 3 results) No results for input(s): PROBNP in the last 8760 hours. HbA1C: Recent Labs    02/08/20 0207  HGBA1C 6.4*   CBG: Recent Labs  Lab 02/09/20 0735 02/09/20 1149 02/09/20 1632 02/09/20 2214 02/10/20 0854  GLUCAP 124* 130* 146* 156* 138*   Lipid Profile: Recent Labs    02/08/20 0207  CHOL 145  HDL 33*  LDLCALC 95  TRIG 84  CHOLHDL 4.4   Thyroid Function Tests: No results for input(s): TSH, T4TOTAL, FREET4, T3FREE, THYROIDAB in the last 72 hours. Anemia Panel: No results for input(s): VITAMINB12, FOLATE, FERRITIN, TIBC, IRON, RETICCTPCT in the last 72 hours. Sepsis Labs: No results for input(s): PROCALCITON, LATICACIDVEN in the last 168 hours.  Recent Results (from the past 240  hour(s))  Respiratory Panel by RT PCR (Flu A&B, Covid) - Nasopharyngeal Swab     Status: None   Collection Time: 02/07/20  2:38 PM   Specimen: Nasopharyngeal Swab  Result Value Ref Range Status   SARS Coronavirus 2 by RT PCR NEGATIVE NEGATIVE Final    Comment: (NOTE) SARS-CoV-2 target nucleic acids are NOT DETECTED. The SARS-CoV-2 RNA is generally detectable in upper respiratoy specimens during the acute phase of infection. The lowest concentration of SARS-CoV-2 viral copies this assay can detect is 131 copies/mL. A negative  result does not preclude SARS-Cov-2 infection and should not be used as the sole basis for treatment or other patient management decisions. A negative result may occur with  improper specimen collection/handling, submission of specimen other than nasopharyngeal swab, presence of viral mutation(s) within the areas targeted by this assay, and inadequate number of viral copies (<131 copies/mL). A negative result must be combined with clinical observations, patient history, and epidemiological information. The expected result is Negative. Fact Sheet for Patients:  PinkCheek.be Fact Sheet for Healthcare Providers:  GravelBags.it This test is not yet ap proved or cleared by the [place] FDA and  has been authorized for detection and/or diagnosis of SARS-CoV-2 by FDA under an Emergency Use Authorization (EUA). This EUA will remain  in effect (meaning this test can be used) for the duration of the COVID-19 declaration under Section 564(b)(1) of the Act, 21 U.S.C. section 360bbb-3(b)(1), unless the authorization is terminated or revoked sooner.    Influenza A by PCR NEGATIVE NEGATIVE Final   Influenza B by PCR NEGATIVE NEGATIVE Final    Comment: (NOTE) The Xpert Xpress SARS-CoV-2/FLU/RSV assay is intended as an aid in  the diagnosis of influenza from Nasopharyngeal swab specimens and  should not be used as a sole basis for treatment. Nasal washings and  aspirates are unacceptable for Xpert Xpress SARS-CoV-2/FLU/RSV  testing. Fact Sheet for Patients: PinkCheek.be Fact Sheet for Healthcare Providers: GravelBags.it This test is not yet approved or cleared by the [place] FDA and  has been authorized for detection and/or diagnosis of SARS-CoV-2 by  FDA under an Emergency Use Authorization (EUA). This EUA will remain  in effect (meaning this test can be used) for the  duration of the  Covid-19 declaration under Section 564(b)(1) of the Act, 21  U.S.C. section 360bbb-3(b)(1), unless the authorization is  terminated or revoked. Performed at [facility], [address] [place], [place] [address]          Radiology Studies: CARDIAC CATHETERIZATION  Result Date: 02/10/2020  Previously placed Mid Cx drug eluting stent is widely patent.  Balloon angioplasty was performed.  Ost 1st Diag lesion is 50% stenosed.  Prox RCA lesion is 35% stenosed.  Ost LAD to Prox LAD lesion is 40% stenosed.  49 year old male with known hypertension hyperlipidemia and coronary artery disease status post previous myocardial infarction and stent placement mid circumflex artery in the past who has had a significant stress malignant hypertension and chest discomfort with minimal elevation of troponin consistent with non-ST elevation myocardial infarction LV with inferior hypokinesis and ejection fraction of 40% unchanged from before Patent stent of left circumflex artery Moderate atherosclerosis of proximal left anterior descending artery and diagonal artery noncritical Minimal atherosclerosis of right coronary artery Plan No further cardiac intervention at this time with moderate atherosclerosis stable at this time with no need for intervention High intensity cholesterol therapy Single antiplatelet therapy due to concerns patient needing intervention of kidney stones Hypertension control  for malignant hypertension due to severe stress and pain Cardiac rehabilitation   ECHOCARDIOGRAM COMPLETE  Result Date: 02/09/2020    ECHOCARDIOGRAM REPORT   Patient Name:   John Hartman Date of Exam: 02/08/2020 Medical Rec #:  [id number]   Height:       71.0 in Accession #:    [id number]  Weight:       195.0 lb Date of Birth:  01-30-1971    BSA:          2.09 m Patient Age:    65 years    BP:           177/123 mmHg Patient Gender: M           HR:           91 bpm. Exam Location:  [facility]  Procedure: 2D Echo Indications:     NSTEMI I21.4  History:         Patient has prior history of Echocardiogram examinations, most                  recent 08/24/2018. CHF, CAD; Risk Factors:Hypertension and                  Dyslipidemia.  Sonographer:     Avanell Shackleton Referring Phys:  Unknown Foley NIU Diagnosing Phys: Serafina Royals MD IMPRESSIONS  1. Left ventricular ejection fraction, by estimation, is 40 to 45%. The left ventricle has mildly decreased function. The left ventricle demonstrates global hypokinesis. Left ventricular diastolic parameters were normal.  2. Right ventricular systolic function is normal. The right ventricular size is normal.  3. The mitral valve is normal in structure and function. Mild mitral valve regurgitation.  4. The aortic valve is normal in structure and function. Aortic valve regurgitation is not visualized. FINDINGS  Left Ventricle: Left ventricular ejection fraction, by estimation, is 40 to 45%. The left ventricle has mildly decreased function. The left ventricle demonstrates global hypokinesis. There is no left ventricular hypertrophy. Left ventricular diastolic parameters were normal. Right Ventricle: The right ventricular size is normal. No increase in right ventricular wall thickness. Right ventricular systolic function is normal. Left Atrium: Left atrial size was normal in size. Right Atrium: Right atrial size was normal in size. Pericardium: There is no evidence of pericardial effusion. Mitral Valve: The mitral valve is normal in structure and function. Mild mitral valve regurgitation. Tricuspid Valve: The tricuspid valve is normal in structure. Tricuspid valve regurgitation is trivial. Aortic Valve: The aortic valve is normal in structure and function. Aortic valve regurgitation is not visualized. Pulmonic Valve: The pulmonic valve was normal in structure. Pulmonic valve regurgitation is not visualized. Aorta: The aortic root and ascending aorta are structurally normal,  with no evidence of dilitation. IAS/Shunts: No atrial level shunt detected by color flow Doppler.  LEFT VENTRICLE PLAX 2D LVIDd:         4.31 cm LVIDs:         3.11 cm LV PW:         1.14 cm LV IVS:        1.59 cm LVOT diam:     2.20 cm LV SV Index:   21.38 LVOT Area:     3.80 cm  LEFT ATRIUM             Index       RIGHT ATRIUM           Index LA diam:        4.60 cm  2.21 cm/m  RA Area:     21.80 cm LA Vol (A2C):   53.5 ml 25.65 ml/m RA Volume:   67.90 ml  32.55 ml/m LA Vol (A4C):   46.4 ml 22.24 ml/m LA Biplane Vol: 52.5 ml 25.17 ml/m   AORTA Ao Root diam: 3.50 cm MITRAL VALVE MV Area (PHT): 2.54 cm    SHUNTS MV Decel Time: 299 msec    Systemic Diam: 2.20 cm MV E velocity: 52.40 cm/s MV A velocity: 84.40 cm/s MV E/A ratio:  0.62 Serafina Royals MD Electronically signed by Serafina Royals MD Signature Date/Time: 02/09/2020/6:26:29 AM    Final         Scheduled Meds: . [MAR Hold] allopurinol  100 mg Oral Daily  . [MAR Hold] amLODipine  10 mg Oral Daily  . [MAR Hold] aspirin EC  81 mg Oral Daily  . [MAR Hold] atorvastatin  80 mg Oral Q supper  . [MAR Hold] diphenhydrAMINE  50 mg Oral QHS  . [MAR Hold] hydrALAZINE  25 mg Oral BID  . [MAR Hold] insulin aspart  0-5 Units Subcutaneous QHS  . [MAR Hold] insulin aspart  0-9 Units Subcutaneous TID WC  . isosorbide mononitrate  30 mg Oral Daily  . [MAR Hold] loratadine  10 mg Oral Daily  . [MAR Hold] Melatonin  10 mg Oral QHS  . [MAR Hold] metoprolol tartrate  25 mg Oral BID  . [MAR Hold] pregabalin  50 mg Oral TID  . [MAR Hold] sodium chloride flush  3 mL Intravenous Q12H  . [MAR Hold] tamsulosin  0.4 mg Oral QPC supper  . triamcinolone 0.1 % cream : eucerin   Topical TID  . [MAR Hold] vitamin B-12  1,000 mcg Oral Once per day on Mon Wed Fri   Continuous Infusions: . sodium chloride Stopped (02/10/20 0021)  . sodium chloride    . sodium chloride 1 mL/kg/hr (02/10/20 0229)  . sodium chloride       LOS: 3 days    Time spent: 45  minutes    Ezekiel Slocumb, DO [facility]   If 7PM-7AM, please contact night-coverage [email or web address] 02/10/2020, 9:15 AM

## 2020-02-10 NOTE — H&P (Signed)
UROLOGY H&P UPDATE  Agree with prior H&P dated 02/08/2020.  49 year old male with right-sided flank pain and chest pain found to have a 7 mm right proximal ureteral stone as well as an NSTEMI with troponin of 17,000.  Cardiac cath today without any significant lesions requiring intervention, and cardiology planning medical management. Cleared for urologic surgery.  He has had contralateral ureteroscopy in the past and did not tolerate his stent well with uncontrolled pain, nausea, and multiple ER visits.  He understands the possible need for stent placement post ureteroscopy depending on intraoperative findings, but we will plan to leave the stent on a string so he can self remove if having similar uncontrolled symptoms to before.  Cardiac: RRR Lungs: CTA bilaterally  Laterality: Right Procedure: Ureteroscopy, laser lithotripsy, stent placement  Urine: Urinalysis 02/07/2020 no bacteria, greater than 50 RBCs, 11-20 WBCs, nitrite negative, no leukocytes  We specifically discussed the risks ureteroscopy including bleeding, infection/sepsis, stent related symptoms including flank pain/urgency/frequency/incontinence/dysuria, ureteral injury, inability to access stone, or need for staged or additional procedures.   Billey Co, MD 02/10/2020

## 2020-02-10 NOTE — Progress Notes (Signed)
   02/10/20 1500  Clinical Encounter Type  Visited With Family  Visit Type Follow-up  Referral From Chaplain  Consult/Referral To Chaplain  Chaplain stopped by to check on patient, but he was out of the room, getting a procedure done. Chaplain spoke with Clarene Critchley, patient's wife. She was sitting in the chair with her coat wrapped around her feet. Chaplain went to ICU and got warm blankets for Clarene Critchley and will check in on patient before leaving. Chaplain offered pastoral presence and empathy.

## 2020-02-10 NOTE — Anesthesia Preprocedure Evaluation (Addendum)
Anesthesia Evaluation  Patient identified by MRN, date of birth, ID band Patient awake    Reviewed: Allergy & Precautions, H&P , NPO status , Patient's Chart, lab work & pertinent test results  History of Anesthesia Complications Negative for: history of anesthetic complications  Airway Mallampati: II  TM Distance: >3 FB Neck ROM: full    Dental  (+) Dental Advidsory Given, Caps, Teeth Intact Permanent bridge on bottom left:   Pulmonary neg pulmonary ROS, former smoker,    Pulmonary exam normal        Cardiovascular hypertension, (-) angina+ CAD, + Past MI, + Cardiac Stents and +CHF  (-) CABG Normal cardiovascular exam(-) dysrhythmias (-) Valvular Problems/Murmurs  Pt presented with malignant hypertension and elevated troponins consistent with NSTEMI s/p negative LHC.  From Cardiology note today: Essential hypertension mixed hyperlipidemia coronary artery disease status post previous stent placement with a non-ST elevation myocardial infarction likely secondary to stress-induced issues from a significant pain from kidney stone with malignant hypertension, now resolved. Already catheterization showing mild LV systolic dysfunction and diffuse three-vessel disease to a minimal degree with patent stent 1.  Discontinuation of heparin at this time a due to no evidence of critical coronary artery disease requiring further intervention 2.  Isosorbide for chest discomfort 3.  Further medication management for hypertension control as needed 4.  High intensity cholesterol therapy 5.  Continue supportive therapy and treatment of pain from kidney stone as needed without restriction 6.  No further cardiac intervention at this time due to minimal diffuse three-vessel disease unchanged from before on appropriate medication management   Neuro/Psych negative neurological ROS  negative psych ROS   GI/Hepatic Neg liver ROS, GERD  ,  Endo/Other   diabetes  Renal/GU Renal disease (kidney stones)Kidney stone     Musculoskeletal   Abdominal   Peds  Hematology negative hematology ROS (+)   Anesthesia Other Findings Past Medical History: No date: Angioedema No date: Bronchitis No date: DDD (degenerative disc disease), cervical No date: Diabetes mellitus ([place]) 03/31/2017: Fatty liver     Comment:  [place] April 2018 No date: GERD (gastroesophageal reflux disease) No date: Gout No date: Heart attack ([place]) No date: HTN (hypertension) No date: Hypertension     Comment:  CONTROLLED ON MEDS No date: IBS (irritable bowel syndrome) No date: Kidney stone 11/24/2018: LVH (left ventricular hypertrophy)     Comment:  Moderate, ECHO 08/23/2018: Myocardial infarction [facility]) No date: Seasonal allergies No date: Spinal stenosis  Past Surgical History: 2007: ACDF with fusion 08/15/2016: CARDIAC CATHETERIZATION; Left     Comment:  Procedure: Left Heart Cath and Coronary Angiography;                Surgeon: Wellington Hampshire, MD;  Location: [place]               CV LAB;  Service: Cardiovascular;  Laterality: Left; 06/26/2015: COLONOSCOPY WITH PROPOFOL; N/A     Comment:  Procedure: COLONOSCOPY WITH PROPOFOL;  Surgeon: Lucilla Lame, MD;  Location: [place];  Service:               Endoscopy;  Laterality: N/A;  with biopsies 08/23/2018: CORONARY/GRAFT ACUTE MI REVASCULARIZATION; N/A     Comment:  Procedure: Coronary/Graft Acute MI Revascularization;                Surgeon: Isaias Cowman, MD;  Location: [facility]  INVASIVE CV LAB;  Service: Cardiovascular;  Laterality:               N/A; 11/22/2019: CYSTOSCOPY WITH BIOPSY; N/A     Comment:  Procedure: CYSTOSCOPY WITH Bladder BIOPSY & FULGERATION;              Surgeon: Billey Co, MD;  Location: [facility] ORS;                Service: Urology;  Laterality: N/A; 11/22/2019: CYSTOSCOPY/URETEROSCOPY/HOLMIUM LASER/STENT PLACEMENT;  Left     Comment:   Procedure: CYSTOSCOPY/URETEROSCOPY/BILATERAL RETROGRADE               PYELOGRAM/HOLMIUM LASER/STENT PLACEMENT;  Surgeon:               Billey Co, MD;  Location: [facility] ORS;  Service:               Urology;  Laterality: Left; 08/23/2018: LEFT HEART CATH AND CORONARY ANGIOGRAPHY; N/A     Comment:  Procedure: LEFT HEART CATH AND CORONARY ANGIOGRAPHY;                Surgeon: Isaias Cowman, MD;  Location: [place] CV LAB;  Service: Cardiovascular;  Laterality:               N/A; 2004: NASAL SEPTUM SURGERY 10/01/2019: SHOULDER ARTHROSCOPY W/ ROTATOR CUFF REPAIR 2007: SHOULDER SURGERY; Left 1980s: Testicular torsion No date: VASECTOMY  BMI    Body Mass Index: 28.32 kg/m      Reproductive/Obstetrics negative OB ROS                            Anesthesia Physical Anesthesia Plan  ASA: III  Anesthesia Plan: General   Post-op Pain Management:    Induction: Intravenous  PONV Risk Score and Plan: Ondansetron, Dexamethasone, Midazolam and Treatment may vary due to age or medical condition  Airway Management Planned: Oral ETT  Additional Equipment:   Intra-op Plan:   Post-operative Plan: Extubation in OR  Informed Consent: I have reviewed the patients History and Physical, chart, labs and discussed the procedure including the risks, benefits and alternatives for the proposed anesthesia with the patient or authorized representative who has indicated his/her understanding and acceptance.     Dental Advisory Given  Plan Discussed with: Anesthesiologist  Anesthesia Plan Comments:        Anesthesia Quick Evaluation

## 2020-02-11 DIAGNOSIS — N23 Unspecified renal colic: Secondary | ICD-10-CM

## 2020-02-11 LAB — BASIC METABOLIC PANEL
Anion gap: 9 (ref 5–15)
BUN: 18 mg/dL (ref 6–20)
CO2: 23 mmol/L (ref 22–32)
Calcium: 8.7 mg/dL — ABNORMAL LOW (ref 8.9–10.3)
Chloride: 103 mmol/L (ref 98–111)
Creatinine, Ser: 1.25 mg/dL — ABNORMAL HIGH (ref 0.61–1.24)
GFR calc Af Amer: >60 mL/min (ref >60)
GFR calc non Af Amer: >60 mL/min (ref >60)
Glucose, Bld: 188 mg/dL — ABNORMAL HIGH (ref 70–99)
Potassium: 4.6 mmol/L (ref 3.5–5.1)
Sodium: 135 mmol/L (ref 135–145)

## 2020-02-11 LAB — CBC
HCT: 42.2 % (ref 39.0–52.0)
Hemoglobin: 13.8 g/dL (ref 13.0–17.0)
MCH: 26.4 pg (ref 26.0–34.0)
MCHC: 32.7 g/dL (ref 30.0–36.0)
MCV: 80.8 fL (ref 80.0–100.0)
Platelets: 165 10*3/uL (ref 150–400)
RBC: 5.22 MIL/uL (ref 4.22–5.81)
RDW: 14.1 % (ref 11.5–15.5)
WBC: 10.8 10*3/uL — ABNORMAL HIGH (ref 4.0–10.5)
nRBC: 0 % (ref 0.0–0.2)

## 2020-02-11 LAB — GLUCOSE, CAPILLARY
Glucose-Capillary: 171 mg/dL — ABNORMAL HIGH (ref 70–99)
Glucose-Capillary: 218 mg/dL — ABNORMAL HIGH (ref 70–99)
Glucose-Capillary: 151 mg/dL — ABNORMAL HIGH (ref 70–99)
Glucose-Capillary: 187 mg/dL — ABNORMAL HIGH (ref 70–99)

## 2020-02-11 LAB — MAGNESIUM: Magnesium: 2.3 mg/dL (ref 1.7–2.4)

## 2020-02-11 MED ORDER — KETOROLAC TROMETHAMINE 10 MG PO TABS
10.0000 mg | ORAL_TABLET | Freq: Four times a day (QID) | ORAL | Status: DC | PRN
  Administered 2020-02-11 – 2020-02-12 (×3): 10 mg via ORAL
  Filled 2020-02-11 (×6): qty 1

## 2020-02-11 MED ORDER — HYDROMORPHONE HCL 2 MG PO TABS
8.0000 mg | ORAL_TABLET | ORAL | Status: DC | PRN
  Administered 2020-02-11 – 2020-02-12 (×3): 8 mg via ORAL
  Filled 2020-02-11 (×3): qty 4

## 2020-02-11 MED ORDER — HYDROMORPHONE HCL 1 MG/ML IJ SOLN
1.0000 mg | INTRAMUSCULAR | Status: DC | PRN
  Administered 2020-02-11: 1 mg via INTRAVENOUS
  Filled 2020-02-11 (×2): qty 1

## 2020-02-11 MED ORDER — HYDROMORPHONE HCL 2 MG PO TABS
4.0000 mg | ORAL_TABLET | ORAL | Status: DC | PRN
  Administered 2020-02-11: 4 mg via ORAL
  Filled 2020-02-11: qty 2

## 2020-02-11 MED ORDER — KETOROLAC TROMETHAMINE 15 MG/ML IJ SOLN
15.0000 mg | Freq: Once | INTRAMUSCULAR | Status: AC
  Administered 2020-02-11: 15 mg via INTRAVENOUS
  Filled 2020-02-11: qty 1

## 2020-02-11 NOTE — Progress Notes (Signed)
PROGRESS NOTE    John Hartman  [id number] DOB: 1971-08-13 DOA: 02/07/2020  PCP: John Hartshorn, FNP    LOS - 4   Brief Narrative:  John Hartman a 49 y.o.malewith medical history significant ofhypertension, hyperlipidemia, diabetes mellitus, GERD, gout, CAD, myocardial infarction,sCHF with EF 35-40%, who presentedto the ED on 2/12with right flank painof sudden onset earlier that morning.He also had an episode of substernal chest pain with radiation to the shoulder that had resolved prior to coming to the ED. In the ED patient's initial troponin was 10, however repeats were significantly elevated up to 9920. Cardiology was consulted.CT renal stone study showed a 7 x 5 mm calculus in the proximal right ureter and mild hydronephrosis and renal edema. Urology was consulted. Patient admitted for further evaluation management.  Subjective 2/16: Patient seen this morning.  He continues to have significant pain from kidney stones.  Thinks he may have passed a small piece earlier this morning.  Did have a severe wave of pain that made him very nauseous last night.  Somewhat better this morning the pain is still uncontrolled.  He denies chest pain, shortness of breath, nausea vomiting diarrhea, fevers or chills or other complaints.  We discussed transition to oral pain meds in preparation for discharge, however inadequate control on oral meds so far today.  Assessment & Plan:   Principal Problem:   NSTEMI (non-ST elevated myocardial infarction) [facility]) Active Problems:   Essential hypertension   Coronary artery disease   Hyperlipidemia LDL goal <70   Right ureteral stone   Diabetes mellitus without complication (HCC)   Chronic systolic CHF (congestive heart failure) ([place])   Right ureteral stone-in addition to stones in the poles of both kidneys.  2/15 underwent cystoscopy, right retrograde pyelogram with intraoperative interpretation, right ureteroscopy, laser lithotripsy. No stent  placed. --Urology following,Dr. Diamantina [facility] --transition to PO pain meds: 4 mg dilaudid, 10 mg toradol, alternating the two --titrate oral med dose for adequate control without need to IV --IV Dilaudid for breakthrough pain --IV fluid --Flomax --monitor renal function on toradol, patient understands this can affect his kidneys, but reports most significant pain relief with it and willing to use and monitor   Non-STEMI History of coronary artery disease Elevated troponins peaked at 17,310,last repeat 7799.No active chest pain. Left heart cath 2/15 showed minimal atherosclerosis of the 3 vessels with no evidence of critical stenosis and patent stent of mid circumflex artery, unchanged EF of 40%. --Cardiology following, Dr. Nehemiah Massed --Off heparin drip --Imdur for chest discomfort --High intensity statin, ASA, beta blocker --PRN sublingual nitro   Chronic systolic CHF-currently well compensated and euvolemic Echo in August 2019 showed EF 35 to 40%, this admission EF 40 to 45%. --Continue aspirin beta-blocker hydralazine --Allergic to ACE inhibitors  Acute Kidney Injury - new. Suspect due to getting Toradol past couple days. Is getting IV fluids. Cr 1.40again  this AM (from 1.01 on admission).  --hold off on Toradol for now, can resume once improved --IV fluids --monitor volume status   Headache,resolved.  acute- suspect was due to nitroglycerin. --Fioricet as neededif not controlled with pain meds above  Essential hypertension-chronic and currently uncontrolled, likely due to pain. Home meds appear to be hydralazine 25 mg twice daily, Lopressor 25 mg twice daily. Noteallergy to ACE inhibitor's. --Continue home hydralazine,Lopressor  --Continueamlodipine10 mg (new) --PO hydralazine PRN --IV labetalol PRN --Monitor BP closely and watch for any signs of end-organ damage  Hyperlipidemia-goal LDL less than 70 --Continue high intensity statin  Type  2 diabetes    Last A1c 7.2%, poorly controlled.  Home regimen isRybelsus(semaglutide)and Xigduo-XR (Dapagliflozin-metFORMIN). --Hold home meds --Sliding scale NovoLog --Continue Lyrica for neuropathy  Gout-not acutely flared. -Continue allopurinol  Continue other home meds: B12, Zyrtec, Zanaflex, ophthalmic drops, melatonin, Benadryl  Hold home meds: Celecoxib   DVT prophylaxis:Lovenox Code Status: Full Code Family Communication:none at bedside  Disposition Plan:Expect discharge to prior home environment tomorrow 2/17, pending adequate pain control on oral medications without need for IV. Coming Fromhome Exp DC 2/17 Barriers pain control Medically Stable for Discharge?No  Consultants:  Cardiology, Dr. Nehemiah Massed  Urology, Dr.Sninsky  Procedures:  None  Antimicrobials:  None   Objective: Vitals:   02/11/20 0328 02/11/20 0802 02/11/20 1149 02/11/20 1627  BP: 121/84 129/88 125/82 (!) 140/99  Pulse: 81 74 73 82  Resp:  18 18 18   Temp: 97.6 F (36.4 C) 97.9 F (36.6 C) 97.7 F (36.5 C) 97.9 F (36.6 C)  TempSrc: Oral Oral Oral Oral  SpO2: 96% 99% 99% 99%  Weight:      Height:        Intake/Output Summary (Last 24 hours) at 02/11/2020 1741 Last data filed at 02/11/2020 1229 Gross per 24 hour  Intake 304.2 ml  Output 3325 ml  Net -3020.8 ml   Filed Weights   02/09/20 0417 02/10/20 0401 02/10/20 0738  Weight: 90.9 kg 92.1 kg 92.1 kg    Examination:  General exam: awake, alert, no acute distress HEENT: moist mucus membranes, hearing grossly normal  Respiratory system: CTAB, no wheezes, rales or rhonchi, normal respiratory effort. Cardiovascular system: normal S1/S2, RRR, no JVD, murmurs, rubs, gallops, no pedal edema.   Central nervous system: A&O x4. no gross focal neurologic deficits, normal speech Extremities: moves all, no edema, normal tone Skin: dry, intact, scattered patches of erythema at sites of tele/ECG leads stable Psychiatry:  normal mood, congruent affect, judgement and insight appear normal    Data Reviewed: I have personally reviewed following labs and imaging studies  CBC: Recent Labs  Lab 02/07/20 0806 02/08/20 0207 02/09/20 0533 02/10/20 0459 02/11/20 0520  WBC 9.5 11.0* 7.7 8.2 10.8*  HGB 16.6 15.8 14.8 16.0 13.8  HCT 51.0 48.8 46.5 48.7 42.2  MCV 80.6 80.4 81.7 82.0 80.8  PLT 221 174 149* 167 [id number]   Basic Metabolic Panel: Recent Labs  Lab 02/07/20 0806 02/09/20 0533 02/10/20 0459 02/11/20 0520  NA 139 139 138 135  K 4.0 3.7 4.1 4.6  CL 103 105 104 103  CO2 20* 28 25 23   GLUCOSE 190* 139* 144* 188*  BUN 17 15 12 18   CREATININE 1.01 1.40* 1.32* 1.25*  CALCIUM 9.8 8.8* 9.2 8.7*  MG  --   --  2.0 2.3   GFR: Estimated Creatinine Clearance: 85.6 mL/min (A) (by C-G formula based on SCr of 1.25 mg/dL (H)). Liver Function Tests: No results for input(s): AST, ALT, ALKPHOS, BILITOT, PROT, ALBUMIN in the last 168 hours. No results for input(s): LIPASE, AMYLASE in the last 168 hours. No results for input(s): AMMONIA in the last 168 hours. Coagulation Profile: Recent Labs  Lab 02/07/20 1438  INR 1.1   Cardiac Enzymes: No results for input(s): CKTOTAL, CKMB, CKMBINDEX, TROPONINI in the last 168 hours. BNP (last 3 results) No results for input(s): PROBNP in the last 8760 hours. HbA1C: No results for input(s): HGBA1C in the last 72 hours. CBG: Recent Labs  Lab 02/10/20 1654 02/10/20 2103 02/11/20 0804 02/11/20 1150 02/11/20 1629  GLUCAP 151* 246* 171* 218*  151*   Lipid Profile: No results for input(s): CHOL, HDL, LDLCALC, TRIG, CHOLHDL, LDLDIRECT in the last 72 hours. Thyroid Function Tests: No results for input(s): TSH, T4TOTAL, FREET4, T3FREE, THYROIDAB in the last 72 hours. Anemia Panel: No results for input(s): VITAMINB12, FOLATE, FERRITIN, TIBC, IRON, RETICCTPCT in the last 72 hours. Sepsis Labs: No results for input(s): PROCALCITON, LATICACIDVEN in the last 168  hours.  Recent Results (from the past 240 hour(s))  Respiratory Panel by RT PCR (Flu A&B, Covid) - Nasopharyngeal Swab     Status: None   Collection Time: 02/07/20  2:38 PM   Specimen: Nasopharyngeal Swab  Result Value Ref Range Status   SARS Coronavirus 2 by RT PCR NEGATIVE NEGATIVE Final    Comment: (NOTE) SARS-CoV-2 target nucleic acids are NOT DETECTED. The SARS-CoV-2 RNA is generally detectable in upper respiratoy specimens during the acute phase of infection. The lowest concentration of SARS-CoV-2 viral copies this assay can detect is 131 copies/mL. A negative result does not preclude SARS-Cov-2 infection and should not be used as the sole basis for treatment or other patient management decisions. A negative result may occur with  improper specimen collection/handling, submission of specimen other than nasopharyngeal swab, presence of viral mutation(s) within the areas targeted by this assay, and inadequate number of viral copies (<131 copies/mL). A negative result must be combined with clinical observations, patient history, and epidemiological information. The expected result is Negative. Fact Sheet for Patients:  PinkCheek.be Fact Sheet for Healthcare Providers:  GravelBags.it This test is not yet ap proved or cleared by the [place] FDA and  has been authorized for detection and/or diagnosis of SARS-CoV-2 by FDA under an Emergency Use Authorization (EUA). This EUA will remain  in effect (meaning this test can be used) for the duration of the COVID-19 declaration under Section 564(b)(1) of the Act, 21 U.S.C. section 360bbb-3(b)(1), unless the authorization is terminated or revoked sooner.    Influenza A by PCR NEGATIVE NEGATIVE Final   Influenza B by PCR NEGATIVE NEGATIVE Final    Comment: (NOTE) The Xpert Xpress SARS-CoV-2/FLU/RSV assay is intended as an aid in  the diagnosis of influenza from Nasopharyngeal  swab specimens and  should not be used as a sole basis for treatment. Nasal washings and  aspirates are unacceptable for Xpert Xpress SARS-CoV-2/FLU/RSV  testing. Fact Sheet for Patients: PinkCheek.be Fact Sheet for Healthcare Providers: GravelBags.it This test is not yet approved or cleared by the [place] FDA and  has been authorized for detection and/or diagnosis of SARS-CoV-2 by  FDA under an Emergency Use Authorization (EUA). This EUA will remain  in effect (meaning this test can be used) for the duration of the  Covid-19 declaration under Section 564(b)(1) of the Act, 21  U.S.C. section 360bbb-3(b)(1), unless the authorization is  terminated or revoked. Performed at [facility], [address], [place], [place] [address]          Radiology Studies: CARDIAC CATHETERIZATION  Result Date: 02/10/2020  Previously placed Mid Cx drug eluting stent is widely patent.  Balloon angioplasty was performed.  Ost 1st Diag lesion is 50% stenosed.  Prox RCA lesion is 35% stenosed.  Ost LAD to Prox LAD lesion is 40% stenosed.  49 year old male with known hypertension hyperlipidemia and coronary artery disease status post previous myocardial infarction and stent placement mid circumflex artery in the past who has had a significant stress malignant hypertension and chest discomfort with minimal elevation of troponin consistent with non-ST elevation myocardial infarction  LV with inferior hypokinesis and ejection fraction of 40% unchanged from before Patent stent of left circumflex artery Moderate atherosclerosis of proximal left anterior descending artery and diagonal artery noncritical Minimal atherosclerosis of right coronary artery Plan No further cardiac intervention at this time with moderate atherosclerosis stable at this time with no need for intervention High intensity cholesterol therapy Single antiplatelet therapy due  to concerns patient needing intervention of kidney stones Hypertension control for malignant hypertension due to severe stress and pain Cardiac rehabilitation   DG OR UROLOGY CYSTO IMAGE ([place])  Result Date: 02/10/2020 There is no interpretation for this exam.  This order is for images obtained during a surgical procedure.  Please See "Surgeries" Tab for more information regarding the procedure.        Scheduled Meds: . allopurinol  100 mg Oral Daily  . amLODipine  10 mg Oral Daily  . aspirin EC  81 mg Oral Daily  . atorvastatin  80 mg Oral Q supper  . triamcinolone cream   Topical TID   And  . DermaCerin  1 application Apply externally TID  . diphenhydrAMINE  50 mg Oral QHS  . enoxaparin (LOVENOX) injection  40 mg Subcutaneous Q24H  . hydrALAZINE  25 mg Oral BID  . insulin aspart  0-5 Units Subcutaneous QHS  . insulin aspart  0-9 Units Subcutaneous TID WC  . isosorbide mononitrate  30 mg Oral Daily  . loratadine  10 mg Oral Daily  . Melatonin  10 mg Oral QHS  . metoprolol tartrate  25 mg Oral BID  . pregabalin  50 mg Oral TID  . sodium chloride flush  3 mL Intravenous Q12H  . tamsulosin  0.4 mg Oral QPC supper  . vitamin B-12  1,000 mcg Oral Once per day on Mon Wed Fri   Continuous Infusions:   LOS: 4 days    Time spent: 40-45 minutes    Ezekiel Slocumb, DO [facility]   If 7PM-7AM, please contact night-coverage [email or web address] 02/11/2020, 5:41 PM

## 2020-02-11 NOTE — Progress Notes (Signed)
MD made aware that pts continues to c/o flank pain/ PO meds increased/ will monitor.

## 2020-02-11 NOTE — Progress Notes (Signed)
   02/11/20 1300  Clinical Encounter Type  Visited With Patient and family together  Visit Type Follow-up;Spiritual support;Post-op  Referral From Chaplain  Consult/Referral To Chaplain  Chaplain stopped in to check on patient early this morning to see hoe  he was doing after surgery. Patient said he was in pain, but that was to be expected. Patient told Chaplain he might be going home today.   Chaplain visited with patient and his wife this afternoon. Patient is scheduled to go home if pain medicine in pill form works. Patient and wife thanked Chaplain for her support. Chaplain offered pastoral presence and empathy.

## 2020-02-11 NOTE — Progress Notes (Signed)
Urology Inpatient Progress Note  Subjective: John Hartman is a 49 y.o. male admitted on 02/07/2020 with right renal colic and nausea associated with a 7 mm right proximal ureteral stone with upstream hydronephrosis as well as NSTEMI.  Cardiac catheterization with no significant findings requiring intervention.  He is POD 1 from cystoscopy and right ureteroscopy with laser lithotripsy with Dr. Diamantina [facility].  Creatinine down today, 1.25.  WBC count up today, 10.8.  VSS.  Today, he reports improvement in his pain.  He is requiring pain medication every 4-5 hours, as opposed to every 2 hours prior to surgery.  He states the pain is slightly less in intensity than prior to surgery.  Anti-infectives: Anti-infectives (From admission, onward)   None      Current Facility-Administered Medications  Medication Dose Route Frequency Provider Last Rate Last Admin  . 0.9 %  sodium chloride infusion   Intravenous Continuous Ezekiel Slocumb, DO 75 mL/hr at 02/11/20 0349 New Bag at 02/11/20 0349  . acetaminophen (TYLENOL) tablet 650 mg  650 mg Oral Q6H PRN Billey Co, MD      . acetaminophen (TYLENOL) tablet 650 mg  650 mg Oral Q4H PRN Billey Co, MD      . allopurinol (ZYLOPRIM) tablet 100 mg  100 mg Oral Daily Billey Co, MD   100 mg at 02/11/20 [id number]  . ALPRAZolam Duanne Moron) tablet 0.25 mg  0.25 mg Oral BID PRN Billey Co, MD   0.25 mg at 02/10/20 2128  . amLODipine (NORVASC) tablet 10 mg  10 mg Oral Daily Billey Co, MD   10 mg at 02/11/20 0830  . aspirin EC tablet 81 mg  81 mg Oral Daily Billey Co, MD   81 mg at 02/11/20 0831  . atorvastatin (LIPITOR) tablet 80 mg  80 mg Oral Q supper Billey Co, MD   80 mg at 02/10/20 1752  . butalbital-acetaminophen-caffeine (FIORICET) 50-325-40 MG per tablet 1 tablet  1 tablet Oral Q6H PRN Billey Co, MD   1 tablet at 02/11/20 0355  . triamcinolone cream (KENALOG) 0.1 %   Topical TID Billey Co, MD   Given at 02/11/20  0845   And  . DermaCerin CREA 1 application  1 application Apply externally TID Billey Co, MD   1 application at [id number] [phone]  . diphenhydrAMINE (BENADRYL) capsule 50 mg  50 mg Oral QHS Billey Co, MD   50 mg at 02/10/20 2126  . enoxaparin (LOVENOX) injection 40 mg  40 mg Subcutaneous Q24H Nicole Kindred A, DO   40 mg at 02/11/20 [id number]  . hydrALAZINE (APRESOLINE) tablet 25 mg  25 mg Oral BID Billey Co, MD   25 mg at 02/11/20 0830  . hydrALAZINE (APRESOLINE) tablet 25 mg  25 mg Oral TID PRN Billey Co, MD      . HYDROmorphone (DILAUDID) injection 1 mg  1 mg Intravenous Q2H PRN Nicole Kindred A, DO      . HYDROmorphone (DILAUDID) tablet 4 mg  4 mg Oral Q4H PRN Nicole Kindred A, DO      . insulin aspart (novoLOG) injection 0-5 Units  0-5 Units Subcutaneous QHS Billey Co, MD   2 Units at 02/10/20 2130  . insulin aspart (novoLOG) injection 0-9 Units  0-9 Units Subcutaneous TID WC Billey Co, MD   2 Units at 02/11/20 0831  . isosorbide mononitrate (IMDUR) 24 hr tablet 30 mg  30 mg Oral Daily Sninsky,  Herbert Seta, MD   30 mg at 02/11/20 0829  . ketorolac (TORADOL) tablet 10 mg  10 mg Oral Q6H PRN Nicole Kindred A, DO      . labetalol (NORMODYNE) injection 10 mg  10 mg Intravenous Q2H PRN Billey Co, MD      . loratadine (CLARITIN) tablet 10 mg  10 mg Oral Daily Billey Co, MD   10 mg at 02/11/20 0831  . Melatonin TABS 10 mg  10 mg Oral QHS Billey Co, MD   10 mg at 02/10/20 2128  . metoprolol tartrate (LOPRESSOR) injection 5 mg  5 mg Intravenous Q5 min PRN Billey Co, MD      . metoprolol tartrate (LOPRESSOR) tablet 25 mg  25 mg Oral BID Billey Co, MD   25 mg at 02/11/20 0831  . nitroGLYCERIN (NITROSTAT) SL tablet 0.4 mg  0.4 mg Sublingual Q5 min PRN Billey Co, MD   0.4 mg at 02/09/20 1344  . ondansetron (ZOFRAN) injection 4 mg  4 mg Intravenous Q8H PRN Billey Co, MD   4 mg at 02/08/20 2018  . ondansetron (ZOFRAN)  injection 4 mg  4 mg Intravenous Q6H PRN Billey Co, MD      . oxyCODONE-acetaminophen (PERCOCET/ROXICET) 5-325 MG per tablet 1 tablet  1 tablet Oral Q4H PRN Billey Co, MD   1 tablet at 02/10/20 0943  . pregabalin (LYRICA) capsule 50 mg  50 mg Oral TID Billey Co, MD   50 mg at 02/11/20 [id number]  . sodium chloride flush (NS) 0.9 % injection 3 mL  3 mL Intravenous Q12H Billey Co, MD   3 mL at 02/11/20 0833  . tamsulosin (FLOMAX) capsule 0.4 mg  0.4 mg Oral QPC supper Billey Co, MD   0.4 mg at 02/10/20 1802  . tiZANidine (ZANAFLEX) tablet 4 mg  4 mg Oral Q8H PRN Billey Co, MD      . vitamin B-12 (CYANOCOBALAMIN) tablet 1,000 mcg  1,000 mcg Oral Once per day on Mon Wed Fri Sninsky, Herbert Seta, MD   1,000 mcg at 02/07/20 1607   Objective: Vital signs in last 24 hours: Temp:  [97.4 F (36.3 C)-99.2 F (37.3 C)] 97.9 F (36.6 C) (02/16 0802) Pulse Rate:  [74-119] 74 (02/16 0802) Resp:  [12-21] 18 (02/16 0802) BP: (121-160)/(84-130) 129/88 (02/16 0802) SpO2:  [91 %-99 %] 99 % (02/16 0802)  Intake/Output from previous day: 02/15 0701 - 02/16 0700 In: 1451.3 [I.V.:1451.3] Out: 3627 [Urine:3625; Blood:2] Intake/Output this shift: Total I/O In: 120 [P.O.:120] Out: 475 [Urine:475]  Physical Exam Vitals and nursing note reviewed.  Constitutional:      General: He is not in acute distress.    Appearance: He is not ill-appearing, toxic-appearing or diaphoretic.  HENT:     Head: Normocephalic and atraumatic.  Pulmonary:     Effort: Pulmonary effort is normal. No respiratory distress.  Abdominal:     General: There is no distension.     Palpations: Abdomen is soft.     Tenderness: There is no abdominal tenderness. There is no guarding or rebound.  Skin:    General: Skin is warm and dry.  Neurological:     Mental Status: He is alert and oriented to person, place, and time.  Psychiatric:        Mood and Affect: Mood normal.        Behavior: Behavior  normal.    Lab Results:  Recent  Labs    02/10/20 0459 02/11/20 0520  WBC 8.2 10.8*  HGB 16.0 13.8  HCT 48.7 42.2  PLT 167 165   BMET Recent Labs    02/10/20 0459 02/11/20 0520  NA 138 135  K 4.1 4.6  CL 104 103  CO2 25 23  GLUCOSE 144* 188*  BUN 12 18  CREATININE 1.32* 1.25*  CALCIUM 9.2 8.7*   Assessment & Plan: 49 year old male with extensive cardiac history now POD 1 from right ureteroscopy and laser lithotripsy for management of a 7 mm proximal ureteral stone associated with NSTEMI.  Clinically improving following surgery.  We will proceed with plans for discharge later today.  Recommend discharge with Flomax 0.4 mg daily x10 days, PO Toradol, and narcotics for pain management.  We have already scheduled him for outpatient 24-hour urine collection for metabolic stone work-up in 4 weeks.  Debroah Loop, PA-C 02/11/2020

## 2020-02-12 ENCOUNTER — Telehealth: Payer: Self-pay | Admitting: Urology

## 2020-02-12 LAB — CBC
HCT: 42.2 % (ref 39.0–52.0)
Hemoglobin: 13.5 g/dL (ref 13.0–17.0)
MCH: 25.9 pg — ABNORMAL LOW (ref 26.0–34.0)
MCHC: 32 g/dL (ref 30.0–36.0)
MCV: 81 fL (ref 80.0–100.0)
Platelets: 181 10*3/uL (ref 150–400)
RBC: 5.21 MIL/uL (ref 4.22–5.81)
RDW: 14.3 % (ref 11.5–15.5)
WBC: 10.2 10*3/uL (ref 4.0–10.5)
nRBC: 0 % (ref 0.0–0.2)

## 2020-02-12 LAB — BASIC METABOLIC PANEL
Anion gap: 7 (ref 5–15)
BUN: 21 mg/dL — ABNORMAL HIGH (ref 6–20)
CO2: 25 mmol/L (ref 22–32)
Calcium: 8.8 mg/dL — ABNORMAL LOW (ref 8.9–10.3)
Chloride: 104 mmol/L (ref 98–111)
Creatinine, Ser: 1.28 mg/dL — ABNORMAL HIGH (ref 0.61–1.24)
GFR calc Af Amer: >60 mL/min (ref >60)
GFR calc non Af Amer: >60 mL/min (ref >60)
Glucose, Bld: 209 mg/dL — ABNORMAL HIGH (ref 70–99)
Potassium: 4.2 mmol/L (ref 3.5–5.1)
Sodium: 136 mmol/L (ref 135–145)

## 2020-02-12 LAB — MAGNESIUM: Magnesium: 2.3 mg/dL (ref 1.7–2.4)

## 2020-02-12 LAB — GLUCOSE, CAPILLARY: Glucose-Capillary: 158 mg/dL — ABNORMAL HIGH (ref 70–99)

## 2020-02-12 MED ORDER — KETOROLAC TROMETHAMINE 10 MG PO TABS: 10.0000 mg | ORAL_TABLET | Freq: Four times a day (QID) | ORAL | 0 refills | Status: AC | PRN

## 2020-02-12 MED ORDER — ISOSORBIDE MONONITRATE ER 30 MG PO TB24: 30.0000 mg | ORAL_TABLET | Freq: Every day | ORAL | 1 refills | Status: DC

## 2020-02-12 MED ORDER — DERMACERIN EX CREA: 1.0000 "application " | TOPICAL_CREAM | Freq: Three times a day (TID) | CUTANEOUS | 0 refills | Status: DC

## 2020-02-12 MED ORDER — TRIAMCINOLONE ACETONIDE 0.1 % EX CREA: TOPICAL_CREAM | Freq: Three times a day (TID) | CUTANEOUS | 0 refills | Status: DC

## 2020-02-12 MED ORDER — TAMSULOSIN HCL 0.4 MG PO CAPS: 0.4000 mg | ORAL_CAPSULE | Freq: Every day | ORAL | 1 refills | Status: DC

## 2020-02-12 MED ORDER — HYDROMORPHONE HCL 8 MG PO TABS: 8.0000 mg | ORAL_TABLET | ORAL | 0 refills | Status: AC | PRN

## 2020-02-12 MED ORDER — AMLODIPINE BESYLATE 10 MG PO TABS: 10.0000 mg | ORAL_TABLET | Freq: Every day | ORAL | 1 refills | Status: AC

## 2020-02-12 NOTE — Addendum Note (Signed)
Addendum  created 02/12/20 0755 by Gunnar Fusi, MD   Attestation recorded in [place], [place] filed

## 2020-02-12 NOTE — Anesthesia Postprocedure Evaluation (Signed)
Anesthesia Post Note  Patient: John Hartman  Procedure(s) Performed: CYSTOSCOPY/URETEROSCOPY/HOLMIUM LASER CYSTOSCOPY WITH RETROGRADE PYELOGRAM  Patient location during evaluation: PACU Anesthesia Type: General Level of consciousness: awake and alert Pain management: pain level controlled Vital Signs Assessment: post-procedure vital signs reviewed and stable Respiratory status: spontaneous breathing and respiratory function stable Cardiovascular status: stable Anesthetic complications: no     Last Vitals:  Vitals:   02/11/20 1956 02/12/20 0420  BP: (!) 145/96 (!) 127/98  Pulse: 99 85  Resp: 17 18  Temp: 36.5 C 36.7 C  SpO2: 97% 99%    Last Pain:  Vitals:   02/12/20 0704  TempSrc:   PainSc: 2                  Zitlaly Malson K

## 2020-02-12 NOTE — Discharge Summary (Signed)
Physician Discharge Summary  John Hartman DOB: Jan 25, 1971 DOA: 02/07/2020  PCP: [name], FNP  Admit date: 02/07/2020 Discharge date: 02/17/2020  Admitted From: Home  Disposition:  Home  Recommendations for Outpatient Follow-up:  1. Follow up with PCP in 1-2 weeks 2. Please obtain BMP/CBC in one week 3. Follow up renal function given use of Toradol 4. Follow up blood pressure for optimization 5. Please follow up cardiology as scheudled 6. Please follow up with urology as scheduled  Home Health: No Equipment/Devices: None  Discharge Condition: Stable CODE STATUS: Full Diet recommendation: Heart Healthy / Carb Modified   Brief/Interim Summary:  John Hartman Crueyis a 49 y.o.malewith medical history significant ofhypertension, hyperlipidemia, diabetes mellitus, GERD, gout, CAD, myocardial infarction,sCHF with EF 35-40%, who presentedto the ED on 2/12with right flank painof sudden onset earlier that morning.He also had an episode of substernal chest pain with radiation to the shoulder that had resolved prior to coming to the ED. In the ED patient's initial troponin was 10, however repeats were significantly elevated up to 9920. Cardiology was consulted.CT renal stone study showed a 7 x 5 mm calculus in the proximal right ureter and mild hydronephrosis and renal edema. Urology was consulted. Patient admitted for further evaluation management.   Right ureteral stone-in addition to stones in the poles of both kidneys.2/15 underwent cystoscopy, right retrograde pyelogram with intraoperative interpretation, right ureteroscopy, laser lithotripsy. No stent placed. --Urology following,Dr. Diamantina Providence --transition to PO pain meds: 8 mg dilaudid, 10 mg toradol, alternating the two --Flomax --monitor renal function on toradol, patient understands this can affect his kidneys, but reports most significant pain relief with it and willing to use and monitor    Non-STEMI History of coronary artery disease Elevated troponins peaked at 17,310,last repeat 7799.No active chest pain. Left heart cath 2/15 showedminimal atherosclerosis of the 3 vessels with no evidence of critical stenosis and patent stent of mid circumflex artery, unchanged EF of 40%. --Cardiology following, Dr. Nehemiah Massed --Treated withheparin drip --Imdur for chest discomfort --High intensity statin, ASA, beta blocker --PRN sublingual nitro  Chronic systolic CHF-currently well compensated and euvolemic Echo in August 2019 showed EF 35 to 40%,this admission EF 40 to 45%. --Continue aspirin beta-blocker hydralazine --Allergic to ACE inhibitors  Acute Kidney Injury - new. Suspect due to getting Toradol past couple days. Is getting IV fluids. Cr 1.40againthis AM (from 1.01 on admission).  --hold off onToradolfor now, can resume once improved --IV fluids --monitor volume status   Headache,resolved.acute-suspect wasdue to nitroglycerin. --Fioricet as neededif not controlled with pain meds above  Essential hypertension-chronic and currently uncontrolled, likely due to pain. Home meds appear to be hydralazine 25 mg twice daily, Lopressor 25 mg twice daily. Noteallergy to ACE inhibitor's. --Continue home hydralazine,Lopressor  --Continueamlodipine10 mg(new) --PO hydralazine PRN --IV labetalol PRN --Monitor BP closely and watch for any signs of end-organ damage  Hyperlipidemia-goal LDL less than 70 --Continuehigh intensitystatin  Type 2 diabetes  Last A1c 7.2%, poorly controlled.  Home regimen isRybelsus(semaglutide)and Xigduo-XR (Dapagliflozin-metFORMIN). --Hold home meds --Sliding scale NovoLog --Continue Lyrica for neuropathy  Gout-not acutely flared. -Continue allopurinol  Continue other home meds: B12, Zyrtec, Zanaflex, ophthalmic drops, melatonin, Benadryl  Hold home meds: Celecoxib   Discharge Diagnoses: Principal  Problem:   NSTEMI (non-ST elevated myocardial infarction) ([place]) Active Problems:   Essential hypertension   Coronary artery disease   Hyperlipidemia LDL goal <70   Right ureteral stone   Diabetes mellitus without complication (HCC)   Chronic systolic CHF (congestive heart failure) ([place])  Discharge Instructions   Discharge Instructions    AMB Referral to Cardiac Rehabilitation - Phase II   Complete by: As directed    Diagnosis: NSTEMI   Call MD for:  severe uncontrolled pain   Complete by: As directed    Call MD for:  temperature >100.4   Complete by: As directed    Diet - low sodium heart healthy   Complete by: As directed    Discharge instructions   Complete by: As directed    Use "as needed" pain meds consistently for first couple days home, then try to lengthen time between doses to monitor your pain level.  Follow up with PCP or urology in still in significant pain 3-4 days from now.   Increase activity slowly   Complete by: As directed      Allergies as of 02/12/2020      Reactions   Lisinopril Swelling   Other Hives   adhesives   Drug [tape] Itching   Rash, blisters   Ace Inhibitors Swelling      Medication List    TAKE these medications   Accu-Chek Softclix Lancets lancets USE AS DIRECTED   allopurinol 100 MG tablet Commonly known as: ZYLOPRIM Take 100 mg by mouth daily.   amLODipine 10 MG tablet Commonly known as: NORVASC Take 1 tablet (10 mg total) by mouth daily.   aspirin EC 81 MG tablet Take 81 mg by mouth daily.   atorvastatin 80 MG tablet Commonly known as: LIPITOR Take 1 tablet (80 mg total) by mouth daily at 6 PM. What changed: when to take this   Belbuca 450 MCG Film Generic drug: Buprenorphine HCl Place 1 Film inside cheek 2 (two) times daily.   BIOFREEZE EX Apply 1 application topically 3 (three) times daily as needed (muscle cramps/shoulder pain.).   cetirizine 10 MG tablet Commonly known as: ZYRTEC Take 10 mg by mouth at  bedtime.   DermaCerin Crea Apply 1 application topically 3 (three) times daily.   diphenhydrAMINE 25 MG tablet Commonly known as: BENADRYL Take 50 mg by mouth at bedtime.   ezetimibe 10 MG tablet Commonly known as: ZETIA Take 1 tablet (10 mg total) by mouth daily.   hydrALAZINE 25 MG tablet Commonly known as: APRESOLINE Take 1 tablet (25 mg total) by mouth 2 (two) times daily.   HYDROmorphone 8 MG tablet Commonly known as: DILAUDID Take 1 tablet (8 mg total) by mouth every 4 (four) hours as needed for up to 5 days for moderate pain or severe pain.   isosorbide mononitrate 30 MG 24 hr tablet Commonly known as: IMDUR Take 1 tablet (30 mg total) by mouth daily.   ketorolac 10 MG tablet Commonly known as: TORADOL Take 1 tablet (10 mg total) by mouth every 6 (six) hours as needed for up to 10 days for moderate pain or severe pain. What changed: reasons to take this   Melatonin 5 MG Tabs Take 10 mg by mouth at bedtime.   metoprolol tartrate 25 MG tablet Commonly known as: LOPRESSOR Take 25 mg by mouth 2 (two) times daily.   neomycin-polymyxin b-dexamethasone 3.5-10000-0.1 Susp Commonly known as: MAXITROL Place 1 drop into the left eye daily.   nitroGLYCERIN 0.4 MG SL tablet Commonly known as: NITROSTAT Place 1 tablet (0.4 mg total) under the tongue every 5 (five) minutes as needed for chest pain.   OneTouch Verio test strip Generic drug: glucose blood USE 2 (TWO) TIMES DAILY USE AS INSTRUCTED.   pregabalin 50 MG  capsule Commonly known as: Lyrica Take 1 capsule (50 mg total) by mouth 3 (three) times daily.   Rybelsus 3 MG Tabs Generic drug: Semaglutide Take 3 mg by mouth daily at 6 (six) AM.   tamsulosin 0.4 MG Caps capsule Commonly known as: FLOMAX Take 1 capsule (0.4 mg total) by mouth daily after supper.   tiZANidine 4 MG tablet Commonly known as: ZANAFLEX Take 1 tablet (4 mg total) by mouth every 8 (eight) hours as needed for muscle spasms.    triamcinolone cream 0.1 % Commonly known as: KENALOG Apply topically 3 (three) times daily.   vitamin B-12 1000 MCG tablet Commonly known as: CYANOCOBALAMIN Take 1,000 mcg by mouth 3 (three) times a week.   Xigduo XR 04-999 MG Tb24 Generic drug: Dapagliflozin-metFORMIN HCl ER Take 2 tablets by mouth daily.      Follow-up Information    DR. SNINSKY. Go on 03/31/2020.   Why: APPOINTMENT AT 9:30AM Contact information: [id number] [place] [address]       Isaias Cowman, MD. Go on 02/25/2020.   Specialty: Cardiology Why: [name] information: [facility] [place] [address] [phone]        [name], [place]. Go on 03/04/2020.   Specialty: Family Medicine Why: APPOINTMENT AT 8:20AM Contact information: [address] [place] [address] [phone]          Allergies  Allergen Reactions  . Lisinopril Swelling  . Other Hives    adhesives  . Drug [Tape] Itching    Rash, blisters  . Ace Inhibitors Swelling    Consultations:  Cardiology, Dr. Nehemiah Massed  Urology, Dr.Sninsky   Procedures/Studies: DG Abdomen 1 View  Result Date: 02/07/2020 CLINICAL DATA:  Right flank pain. EXAM: ABDOMEN - 1 VIEW COMPARISON:  November 27, 2019. FINDINGS: The bowel gas pattern is normal. Bilateral nephrolithiasis is noted. Left ureteral stent is not visualized and has been removed. IMPRESSION: Bilateral nephrolithiasis. Left ureteral stent has been removed. No evidence of bowel obstruction or ileus. Electronically Signed   By: Marijo Conception M.D.   On: 02/07/2020 12:20   CARDIAC CATHETERIZATION  Result Date: 02/10/2020  Previously placed Mid Cx drug eluting stent is widely patent.  Balloon angioplasty was performed.  Ost 1st Diag lesion is 50% stenosed.  Prox RCA lesion is 35% stenosed.  Ost LAD to Prox LAD lesion is 40% stenosed.  49 year old male with known hypertension  hyperlipidemia and coronary artery disease status post previous myocardial infarction and stent placement mid circumflex artery in the past who has had a significant stress malignant hypertension and chest discomfort with minimal elevation of troponin consistent with non-ST elevation myocardial infarction LV with inferior hypokinesis and ejection fraction of 40% unchanged from before Patent stent of left circumflex artery Moderate atherosclerosis of proximal left anterior descending artery and diagonal artery noncritical Minimal atherosclerosis of right coronary artery Plan No further cardiac intervention at this time with moderate atherosclerosis stable at this time with no need for intervention High intensity cholesterol therapy Single antiplatelet therapy due to concerns patient needing intervention of kidney stones Hypertension control for malignant hypertension due to severe stress and pain Cardiac rehabilitation   DG OR UROLOGY CYSTO IMAGE ([place])  Result Date: 02/10/2020 There is no interpretation for this exam.  This order is for images obtained during a surgical procedure.  Please See "Surgeries" Tab for more information regarding the procedure.   ECHOCARDIOGRAM COMPLETE  Result Date: 02/09/2020  ECHOCARDIOGRAM REPORT   Patient Name:   John Hartman Date of Exam: 02/08/2020 Medical Rec #:  [id number]   Height:       71.0 in Accession #:    [id number]  Weight:       195.0 lb Date of Birth:  August 24, 1971    BSA:          2.09 m Patient Age:    28 years    BP:           177/123 mmHg Patient Gender: M           HR:           91 bpm. Exam Location:  [facility] Procedure: 2D Echo Indications:     NSTEMI I21.4  History:         Patient has prior history of Echocardiogram examinations, most                  recent 08/24/2018. CHF, CAD; Risk Factors:Hypertension and                  Dyslipidemia.  Sonographer:     Avanell Shackleton Referring Phys:  Unknown Foley NIU Diagnosing Phys: Serafina Royals MD IMPRESSIONS  1. Left  ventricular ejection fraction, by estimation, is 40 to 45%. The left ventricle has mildly decreased function. The left ventricle demonstrates global hypokinesis. Left ventricular diastolic parameters were normal.  2. Right ventricular systolic function is normal. The right ventricular size is normal.  3. The mitral valve is normal in structure and function. Mild mitral valve regurgitation.  4. The aortic valve is normal in structure and function. Aortic valve regurgitation is not visualized. FINDINGS  Left Ventricle: Left ventricular ejection fraction, by estimation, is 40 to 45%. The left ventricle has mildly decreased function. The left ventricle demonstrates global hypokinesis. There is no left ventricular hypertrophy. Left ventricular diastolic parameters were normal. Right Ventricle: The right ventricular size is normal. No increase in right ventricular wall thickness. Right ventricular systolic function is normal. Left Atrium: Left atrial size was normal in size. Right Atrium: Right atrial size was normal in size. Pericardium: There is no evidence of pericardial effusion. Mitral Valve: The mitral valve is normal in structure and function. Mild mitral valve regurgitation. Tricuspid Valve: The tricuspid valve is normal in structure. Tricuspid valve regurgitation is trivial. Aortic Valve: The aortic valve is normal in structure and function. Aortic valve regurgitation is not visualized. Pulmonic Valve: The pulmonic valve was normal in structure. Pulmonic valve regurgitation is not visualized. Aorta: The aortic root and ascending aorta are structurally normal, with no evidence of dilitation. IAS/Shunts: No atrial level shunt detected by color flow Doppler.  LEFT VENTRICLE PLAX 2D LVIDd:         4.31 cm LVIDs:         3.11 cm LV PW:         1.14 cm LV IVS:        1.59 cm LVOT diam:     2.20 cm LV SV Index:   21.38 LVOT Area:     3.80 cm  LEFT ATRIUM             Index       RIGHT ATRIUM           Index LA diam:         4.60 cm 2.21 cm/m  RA Area:     21.80 cm LA Vol (A2C):   53.5 ml 25.65 ml/m RA Volume:  67.90 ml  32.55 ml/m LA Vol (A4C):   46.4 ml 22.24 ml/m LA Biplane Vol: 52.5 ml 25.17 ml/m   AORTA Ao Root diam: 3.50 cm MITRAL VALVE MV Area (PHT): 2.54 cm    SHUNTS MV Decel Time: 299 msec    Systemic Diam: 2.20 cm MV E velocity: 52.40 cm/s MV A velocity: 84.40 cm/s MV E/A ratio:  0.62 Serafina Royals MD Electronically signed by Serafina Royals MD Signature Date/Time: 02/09/2020/6:26:29 AM    Final    CT Renal Stone Study  Result Date: 02/07/2020 CLINICAL DATA:  And flank pain EXAM: CT ABDOMEN AND PELVIS WITHOUT CONTRAST TECHNIQUE: Multidetector CT imaging of the abdomen and pelvis was performed following the standard protocol without oral or IV contrast. COMPARISON:  March 26, 2019 FINDINGS: Lower chest: Lung bases are clear. There are foci of coronary artery calcification. Hepatobiliary: No focal liver lesions are evident on this noncontrast enhanced study. Gallbladder wall is not appreciably thickened. There is no biliary duct dilatation. Pancreas: There is no pancreatic mass or inflammatory focus. Spleen: No splenic lesions are evident. Adrenals/Urinary Tract: Adrenals bilaterally appear normal. Right kidney is subtly edematous. There is a cyst in the mid left kidney measuring 1.2 x 1.0 cm, better seen on prior contrast enhanced study. There is mild hydronephrosis on the right. There is no appreciable hydronephrosis on the left. There is a 3 x 2 mm calculus in the lower pole of the right kidney. There is a 4 x 3 mm calculus in the lower pole of the left kidney. There is a calculus in the proximal right ureter at the L3-4 level measuring 7 x 5 mm. No other ureteral calculi are evident. Urinary bladder is midline with wall thickness within normal limits. Stomach/Bowel: There is no appreciable bowel wall or mesenteric thickening. There is moderate stool in the colon. Note that most small bowel loops are  fluid-filled. There is no demonstrable bowel obstruction. The terminal ileum appears unremarkable. There is no evident free air or portal venous air. Vascular/Lymphatic: There is aortic atherosclerosis. There is calcification in the proximal right renal artery. There is also mild calcification in the left common iliac artery. There is no evident abdominal aortic aneurysm. No adenopathy evident in the abdomen or pelvis. Reproductive: There are a few prostatic calculi. Prostate and seminal vesicles are normal in size and contour. No pelvic mass evident. Other: Appendix appears normal. There is no evident abscess or ascites in the abdomen or pelvis. There is fat in each inguinal ring. Musculoskeletal: No blastic or lytic bone lesions. There is degenerative change in the thoracic spine. No intramuscular lesions are evident. IMPRESSION: 1. 7 x 5 mm calculus in the proximal right ureter at the L3-4 level with mild hydronephrosis and mild right renal edema. 2.  Nonobstructing calculus in the lower pole of each kidney. 3. Fluid throughout most small bowel loops, a finding that may indicate a degree of enteritis or ileus. Ileus secondary to the obstructing calculus on the right is quite possible. No bowel obstruction evident. 4.  No abscess.  Appendix appears normal. 5. Aortic Atherosclerosis (ICD10-I70.0). There is also calcification in the right proximal renal artery as well as foci of coronary artery calcification. Electronically Signed   By: Lowella Grip III M.D.   On: 02/07/2020 08:42      2/14 Cardiac Catheterization  2/15 Cystoscopy, right retrograde pyelogram with intraoperative interpretation, right ureteroscopy,   laser lithotripsy    Subjective:  Patient seen this AM, states good pain relief  at current doses of oral dilaudid and toradol.  Again discussed BP and renal issues with Toradol, patient states understanding, gets best pain relief and does no expect will need it long.  Agrees to follow up  closely with PCP.  No chest pain this AM.    Discharge Exam: Vitals:   02/12/20 0420 02/12/20 0810  BP: (!) 127/98 137/90  Pulse: 85 79  Resp: 18 18  Temp: 98 F (36.7 C) 98 F (36.7 C)  SpO2: 99% 99%   Vitals:   02/11/20 1627 02/11/20 1956 02/12/20 0420 02/12/20 0810  BP: (!) 140/99 (!) 145/96 (!) 127/98 137/90  Pulse: 82 99 85 79  Resp: 18 17 18 18   Temp: 97.9 F (36.6 C) 97.7 F (36.5 C) 98 F (36.7 C) 98 F (36.7 C)  TempSrc: Oral Oral Oral Oral  SpO2: 99% 97% 99% 99%  Weight:      Height:        General: Pt is alert, awake, not in acute distress Cardiovascular: RRR, S1/S2 +, no rubs, no gallops Respiratory: CTA bilaterally, no wheezing, no rhonchi Abdominal: Soft, NT, ND, bowel sounds + Extremities: no edema, no cyanosis    The results of significant diagnostics from this hospitalization (including imaging, microbiology, ancillary and laboratory) are listed below for reference.     Microbiology: Recent Results (from the past 240 hour(s))  Respiratory Panel by RT PCR (Flu A&B, Covid) - Nasopharyngeal Swab     Status: None   Collection Time: 02/07/20  2:38 PM   Specimen: Nasopharyngeal Swab  Result Value Ref Range Status   SARS Coronavirus 2 by RT PCR NEGATIVE NEGATIVE Final    Comment: (NOTE) SARS-CoV-2 target nucleic acids are NOT DETECTED. The SARS-CoV-2 RNA is generally detectable in upper respiratoy specimens during the acute phase of infection. The lowest concentration of SARS-CoV-2 viral copies this assay can detect is 131 copies/mL. A negative result does not preclude SARS-Cov-2 infection and should not be used as the sole basis for treatment or other patient management decisions. A negative result may occur with  improper specimen collection/handling, submission of specimen other than nasopharyngeal swab, presence of viral mutation(s) within the areas targeted by this assay, and inadequate number of viral copies (<131 copies/mL). A negative  result must be combined with clinical observations, patient history, and epidemiological information. The expected result is Negative. Fact Sheet for Patients:  PinkCheek.be Fact Sheet for Healthcare Providers:  GravelBags.it This test is not yet ap proved or cleared by the [place] FDA and  has been authorized for detection and/or diagnosis of SARS-CoV-2 by FDA under an Emergency Use Authorization (EUA). This EUA will remain  in effect (meaning this test can be used) for the duration of the COVID-19 declaration under Section 564(b)(1) of the Act, 21 U.S.C. section 360bbb-3(b)(1), unless the authorization is terminated or revoked sooner.    Influenza A by PCR NEGATIVE NEGATIVE Final   Influenza B by PCR NEGATIVE NEGATIVE Final    Comment: (NOTE) The Xpert Xpress SARS-CoV-2/FLU/RSV assay is intended as an aid in  the diagnosis of influenza from Nasopharyngeal swab specimens and  should not be used as a sole basis for treatment. Nasal washings and  aspirates are unacceptable for Xpert Xpress SARS-CoV-2/FLU/RSV  testing. Fact Sheet for Patients: PinkCheek.be Fact Sheet for Healthcare Providers: GravelBags.it This test is not yet approved or cleared by the [place] FDA and  has been authorized for detection and/or diagnosis of SARS-CoV-2 by  FDA under an Emergency Use  Authorization (EUA). This EUA will remain  in effect (meaning this test can be used) for the duration of the  Covid-19 declaration under Section 564(b)(1) of the Act, 21  U.S.C. section 360bbb-3(b)(1), unless the authorization is  terminated or revoked. Performed at [facility], [address] [place], [place] [address]      Labs: BNP (last 3 results) Recent Labs    02/07/20 0806  BNP 123XX123   Basic Metabolic Panel: Recent Labs  Lab 02/11/20 0520 02/12/20 0543  NA 135 136   K 4.6 4.2  CL 103 104  CO2 23 25  GLUCOSE 188* 209*  BUN 18 21*  CREATININE 1.25* 1.28*  CALCIUM 8.7* 8.8*  MG 2.3 2.3   Liver Function Tests: No results for input(s): AST, ALT, ALKPHOS, BILITOT, PROT, ALBUMIN in the last 168 hours. No results for input(s): LIPASE, AMYLASE in the last 168 hours. No results for input(s): AMMONIA in the last 168 hours. CBC: Recent Labs  Lab 02/11/20 0520 02/12/20 0543  WBC 10.8* 10.2  HGB 13.8 13.5  HCT 42.2 42.2  MCV 80.8 81.0  PLT 165 181   Cardiac Enzymes: No results for input(s): CKTOTAL, CKMB, CKMBINDEX, TROPONINI in the last 168 hours. BNP: Invalid input(s): POCBNP CBG: Recent Labs  Lab 02/11/20 0804 02/11/20 1150 02/11/20 1629 02/11/20 2122 02/12/20 0812  GLUCAP 171* 218* 151* 187* 158*   D-Dimer No results for input(s): DDIMER in the last 72 hours. Hgb A1c No results for input(s): HGBA1C in the last 72 hours. Lipid Profile No results for input(s): CHOL, HDL, LDLCALC, TRIG, CHOLHDL, LDLDIRECT in the last 72 hours. Thyroid function studies No results for input(s): TSH, T4TOTAL, T3FREE, THYROIDAB in the last 72 hours.  Invalid input(s): FREET3 Anemia work up No results for input(s): VITAMINB12, FOLATE, FERRITIN, TIBC, IRON, RETICCTPCT in the last 72 hours. Urinalysis    Component Value Date/Time   COLORURINE YELLOW (A) 02/07/2020 0806   APPEARANCEUR HAZY (A) 02/07/2020 0806   LABSPEC 1.026 02/07/2020 0806   PHURINE 5.0 02/07/2020 0806   GLUCOSEU >=500 (A) 02/07/2020 0806   HGBUR LARGE (A) 02/07/2020 0806   BILIRUBINUR NEGATIVE 02/07/2020 0806   KETONESUR 20 (A) 02/07/2020 0806   PROTEINUR 100 (A) 02/07/2020 0806   NITRITE NEGATIVE 02/07/2020 0806   LEUKOCYTESUR NEGATIVE 02/07/2020 0806   Sepsis Labs Invalid input(s): PROCALCITONIN,  WBC,  LACTICIDVEN Microbiology Recent Results (from the past 240 hour(s))  Respiratory Panel by RT PCR (Flu A&B, Covid) - Nasopharyngeal Swab     Status: None   Collection Time:  02/07/20  2:38 PM   Specimen: Nasopharyngeal Swab  Result Value Ref Range Status   SARS Coronavirus 2 by RT PCR NEGATIVE NEGATIVE Final    Comment: (NOTE) SARS-CoV-2 target nucleic acids are NOT DETECTED. The SARS-CoV-2 RNA is generally detectable in upper respiratoy specimens during the acute phase of infection. The lowest concentration of SARS-CoV-2 viral copies this assay can detect is 131 copies/mL. A negative result does not preclude SARS-Cov-2 infection and should not be used as the sole basis for treatment or other patient management decisions. A negative result may occur with  improper specimen collection/handling, submission of specimen other than nasopharyngeal swab, presence of viral mutation(s) within the areas targeted by this assay, and inadequate number of viral copies (<131 copies/mL). A negative result must be combined with clinical observations, patient history, and epidemiological information. The expected result is Negative. Fact Sheet for Patients:  PinkCheek.be Fact Sheet for Healthcare Providers:  GravelBags.it This test  is not yet ap proved or cleared by the [place] and  has been authorized for detection and/or diagnosis of SARS-CoV-2 by FDA under an Emergency Use Authorization (EUA). This EUA will remain  in effect (meaning this test can be used) for the duration of the COVID-19 declaration under Section 564(b)(1) of the Act, 21 U.S.C. section 360bbb-3(b)(1), unless the authorization is terminated or revoked sooner.    Influenza A by PCR NEGATIVE NEGATIVE Final   Influenza B by PCR NEGATIVE NEGATIVE Final    Comment: (NOTE) The Xpert Xpress SARS-CoV-2/FLU/RSV assay is intended as an aid in  the diagnosis of influenza from Nasopharyngeal swab specimens and  should not be used as a sole basis for treatment. Nasal washings and  aspirates are unacceptable for Xpert Xpress SARS-CoV-2/FLU/RSV   testing. Fact Sheet for Patients: PinkCheek.be Fact Sheet for Healthcare Providers: GravelBags.it This test is not yet approved or cleared by the [place] FDA and  has been authorized for detection and/or diagnosis of SARS-CoV-2 by  FDA under an Emergency Use Authorization (EUA). This EUA will remain  in effect (meaning this test can be used) for the duration of the  Covid-19 declaration under Section 564(b)(1) of the Act, 21  U.S.C. section 360bbb-3(b)(1), unless the authorization is  terminated or revoked. Performed at [facility], [address] [place], [place] [address]      Time coordinating discharge: Over 30 minutes  SIGNED:   Ezekiel Slocumb, DO [facility] 02/17/2020, 1:50 PM   If 7PM-7AM, please contact night-coverage [email or web address]

## 2020-02-12 NOTE — Telephone Encounter (Signed)
-----   Message from Billey Co, MD sent at 02/10/2020  4:12 PM EST ----- Regarding: follow up This patient should collect a 24 hour urine in ~ 4 weeks and follow up with me in 6-8 weeks to review results, thanks  Nickolas Madrid, MD 02/10/2020

## 2020-02-12 NOTE — Progress Notes (Signed)
Urology Consult Follow Up  Subjective: John Hartman states that yesterday afternoon he had intense right sided abdominal pain which required IV pain medication to control.  He then had another intense pain episode, still in the right side of his abdomen, which was controlled with oral pain medication.  He slept through the night without incidence and just had a slight twinge of pain in the same location for which he took oral Toradol this am.  He has been pain free since that time.  He states his urine has been yellow clear and that the nursing staff told him that they captured a stone fragment in his urine yesterday.  He is wanting to go home today.   Serum creatinine stable at 1.28.  WBC count stable at 10.2.  VSS afebrile.     Anti-infectives: Anti-infectives (From admission, onward)   None      Current Facility-Administered Medications  Medication Dose Route Frequency Provider Last Rate Last Admin  . acetaminophen (TYLENOL) tablet 650 mg  650 mg Oral Q6H PRN Billey Co, MD      . acetaminophen (TYLENOL) tablet 650 mg  650 mg Oral Q4H PRN Billey Co, MD      . allopurinol (ZYLOPRIM) tablet 100 mg  100 mg Oral Daily Billey Co, MD   100 mg at 02/11/20 [id number]  . ALPRAZolam Duanne Moron) tablet 0.25 mg  0.25 mg Oral BID PRN Billey Co, MD   0.25 mg at 02/11/20 2114  . amLODipine (NORVASC) tablet 10 mg  10 mg Oral Daily Billey Co, MD   10 mg at 02/11/20 0830  . aspirin EC tablet 81 mg  81 mg Oral Daily Billey Co, MD   81 mg at 02/11/20 0831  . atorvastatin (LIPITOR) tablet 80 mg  80 mg Oral Q supper Billey Co, MD   80 mg at 02/11/20 1715  . butalbital-acetaminophen-caffeine (FIORICET) 50-325-40 MG per tablet 1 tablet  1 tablet Oral Q6H PRN Billey Co, MD   1 tablet at 02/11/20 0355  . triamcinolone cream (KENALOG) 0.1 %   Topical TID Billey Co, MD   Given at 02/11/20 2114   And  . DermaCerin CREA 1 application  1 application Apply externally TID  Billey Co, MD   1 application at [id number]  . diphenhydrAMINE (BENADRYL) capsule 50 mg  50 mg Oral QHS Billey Co, MD   50 mg at 02/11/20 2110  . enoxaparin (LOVENOX) injection 40 mg  40 mg Subcutaneous Q24H Nicole Kindred A, DO   40 mg at 02/11/20 [id number]  . hydrALAZINE (APRESOLINE) tablet 25 mg  25 mg Oral BID Billey Co, MD   25 mg at 02/11/20 2111  . hydrALAZINE (APRESOLINE) tablet 25 mg  25 mg Oral TID PRN Billey Co, MD      . HYDROmorphone (DILAUDID) injection 1 mg  1 mg Intravenous Q2H PRN Nicole Kindred A, DO   1 mg at 02/11/20 1711  . HYDROmorphone (DILAUDID) tablet 8 mg  8 mg Oral Q4H PRN Nicole Kindred A, DO   8 mg at 02/11/20 2233  . insulin aspart (novoLOG) injection 0-5 Units  0-5 Units Subcutaneous QHS Billey Co, MD   4 Units at 02/11/20 2125  . insulin aspart (novoLOG) injection 0-9 Units  0-9 Units Subcutaneous TID WC Billey Co, MD   2 Units at 02/11/20 1713  . isosorbide mononitrate (IMDUR) 24 hr tablet 30 mg  30 mg  Oral Daily Billey Co, MD   30 mg at 02/11/20 [id number]  . ketorolac (TORADOL) tablet 10 mg  10 mg Oral Q6H PRN Nicole Kindred A, DO   10 mg at 02/12/20 0604  . labetalol (NORMODYNE) injection 10 mg  10 mg Intravenous Q2H PRN Billey Co, MD      . loratadine (CLARITIN) tablet 10 mg  10 mg Oral Daily Billey Co, MD   10 mg at 02/11/20 0831  . Melatonin TABS 10 mg  10 mg Oral QHS Billey Co, MD   10 mg at 02/11/20 2201  . metoprolol tartrate (LOPRESSOR) injection 5 mg  5 mg Intravenous Q5 min PRN Billey Co, MD      . metoprolol tartrate (LOPRESSOR) tablet 25 mg  25 mg Oral BID Billey Co, MD   25 mg at 02/11/20 2111  . nitroGLYCERIN (NITROSTAT) SL tablet 0.4 mg  0.4 mg Sublingual Q5 min PRN Billey Co, MD   0.4 mg at 02/09/20 1344  . ondansetron (ZOFRAN) injection 4 mg  4 mg Intravenous Q8H PRN Billey Co, MD   4 mg at 02/08/20 2018  . ondansetron (ZOFRAN) injection 4 mg  4 mg  Intravenous Q6H PRN Billey Co, MD   4 mg at 02/11/20 1722  . oxyCODONE-acetaminophen (PERCOCET/ROXICET) 5-325 MG per tablet 1 tablet  1 tablet Oral Q4H PRN Billey Co, MD   1 tablet at 02/10/20 0943  . pregabalin (LYRICA) capsule 50 mg  50 mg Oral TID Billey Co, MD   50 mg at 02/11/20 2112  . sodium chloride flush (NS) 0.9 % injection 3 mL  3 mL Intravenous Q12H Billey Co, MD   3 mL at 02/11/20 2112  . tamsulosin (FLOMAX) capsule 0.4 mg  0.4 mg Oral QPC supper Billey Co, MD   0.4 mg at 02/11/20 1714  . tiZANidine (ZANAFLEX) tablet 4 mg  4 mg Oral Q8H PRN Billey Co, MD      . vitamin B-12 (CYANOCOBALAMIN) tablet 1,000 mcg  1,000 mcg Oral Once per day on Mon Wed Fri Sninsky, Brian C, MD   1,000 mcg at 02/07/20 1607     Objective: Vital signs in last 24 hours: Temp:  [97.7 F (36.5 C)-98 F (36.7 C)] 98 F (36.7 C) (02/17 0420) Pulse Rate:  [73-99] 85 (02/17 0420) Resp:  [17-18] 18 (02/17 0420) BP: (125-145)/(82-99) 127/98 (02/17 0420) SpO2:  [97 %-99 %] 99 % (02/17 0420)  Intake/Output from previous day: 02/16 0701 - 02/17 0700 In: 120 [P.O.:120] Out: 3025 [Urine:3025] Intake/Output this shift: No intake/output data recorded.   Physical Exam Constitutional:  Well nourished. Alert and oriented, No acute distress. HEENT: [place] AT, moist mucus membranes.  Trachea midline, no masses. Cardiovascular: No clubbing, cyanosis, or edema. Respiratory: Normal respiratory effort, no increased work of breathing. Neurologic: Grossly intact, no focal deficits, moving all 4 extremities. Psychiatric: Normal mood and affect.  Lab Results:  Recent Labs    02/11/20 0520 02/12/20 0543  WBC 10.8* 10.2  HGB 13.8 13.5  HCT 42.2 42.2  PLT 165 181   BMET Recent Labs    02/11/20 0520 02/12/20 0543  NA 135 136  K 4.6 4.2  CL 103 104  CO2 23 25  GLUCOSE 188* 209*  BUN 18 21*  CREATININE 1.25* 1.28*  CALCIUM 8.7* 8.8*   PT/INR No results for  input(s): LABPROT, INR in the last 72 hours. ABG No results for  input(s): PHART, HCO3 in the last 72 hours.  Invalid input(s): PCO2, PO2  Studies/Results: DG OR UROLOGY CYSTO IMAGE ([facility] ONLY)  Result Date: 02/10/2020 There is no interpretation for this exam.  This order is for images obtained during a surgical procedure.  Please See "Surgeries" Tab for more information regarding the procedure.     Assessment and Plan: 49 year old male with extensive cardiac history now POD 2 from right ureteroscopy and laser lithotripsy for a 7 mm proximal ureteral stone associated with NSTEMI.  Continues to clinically improve following surgery.  Patient is anxious to get home and will likely be discharged later today if pain is managed with oral pain medicine.  Recommend discharge with Flomax 0.4 mg daily x10 days, PO Toradol, and narcotics for pain management.  We have already scheduled him for outpatient 24-hour urine collection for metabolic stone work-up on 03/10/2020 and follow up with Dr. Diamantina Providence to go over those results on 03/31/2020.      LOS: 5 days    [facility] 02/12/2020

## 2020-02-12 NOTE — Telephone Encounter (Signed)
Patient is okay to restart his Plavix/Brilinta today (02/12/2020).

## 2020-02-12 NOTE — Discharge Instructions (Signed)

## 2020-02-12 NOTE — Telephone Encounter (Signed)
done

## 2020-02-12 NOTE — Clinical Social Work Note (Signed)
Readmission risk screening completed at bedside. Pt still drives. Pt has PCP at [facility] in [place]. Pt gets meds filled at [facility] in [place]. Pt lives home with spouse. No additional services needed at this time. Pt d/c today  Loletha Grayer, [place]

## 2020-02-12 NOTE — Progress Notes (Signed)
Educated patient on discharge instructions, including appointments and medications. Patient verbalized understanding. Patient will be driven home by wife.

## 2020-02-17 DIAGNOSIS — Z9289 Personal history of other medical treatment: Secondary | ICD-10-CM | POA: Insufficient documentation

## 2020-02-17 DIAGNOSIS — G4733 Obstructive sleep apnea (adult) (pediatric): Secondary | ICD-10-CM | POA: Insufficient documentation

## 2020-02-18 DIAGNOSIS — I5020 Unspecified systolic (congestive) heart failure: Secondary | ICD-10-CM | POA: Insufficient documentation

## 2020-02-18 DIAGNOSIS — N200 Calculus of kidney: Secondary | ICD-10-CM | POA: Insufficient documentation

## 2020-02-25 ENCOUNTER — Telehealth: Payer: Self-pay

## 2020-02-25 NOTE — Telephone Encounter (Signed)
-----   Message from Billey Co, MD sent at 02/10/2020  4:12 PM EST ----- Regarding: follow up This patient should collect a 24 hour urine in ~ 4 weeks and follow up with me in 6-8 weeks to review results, thanks  Nickolas Madrid, MD 02/10/2020

## 2020-03-04 ENCOUNTER — Encounter: Payer: Self-pay | Admitting: Family Medicine

## 2020-03-04 ENCOUNTER — Other Ambulatory Visit: Payer: Self-pay

## 2020-03-04 ENCOUNTER — Ambulatory Visit (INDEPENDENT_AMBULATORY_CARE_PROVIDER_SITE_OTHER): Payer: 59 | Admitting: Family Medicine

## 2020-03-04 VITALS — BP 110/70 | HR 86 | Temp 98.2°F | Resp 16 | Ht 71.0 in | Wt 202.7 lb

## 2020-03-04 DIAGNOSIS — I214 Non-ST elevation (NSTEMI) myocardial infarction: Secondary | ICD-10-CM

## 2020-03-04 DIAGNOSIS — I10 Essential (primary) hypertension: Secondary | ICD-10-CM

## 2020-03-04 DIAGNOSIS — N200 Calculus of kidney: Secondary | ICD-10-CM | POA: Diagnosis not present

## 2020-03-04 NOTE — Telephone Encounter (Signed)
Patient called you back and he has been scheduled for his 24 hr lab and results app   Sharyn Lull

## 2020-03-04 NOTE — Progress Notes (Signed)
Name: John Hartman   MRN: [id number]    DOB: [birth year]   Date:03/04/2020       Progress Note  Subjective  Chief Complaint  Chief Complaint  Patient presents with  . Hospitalization Follow-up    kidney stone and stress related heart attack. Evaluated by Cardiologist.    [facility] follow up: he developed flank pain on right side on 02/07/2020. He developed chest pain the same day radiating to left shoulder , troponin at [facility] was 10 and kept going up to   9920, but went down prior to discharge . Dr. Nehemiah Massed was consulted and cardiac cath was negative on the 15 th . He had a 7x5 mm stone int he proxima right ureter and mild hydronephrosis and renal edema. He had the stone removed after the cath on 02/10/2020 by Dr. Versie Starks .  Admission: 02/07/2020 Discharge : 02/17/2020   Recurrent kidney stones, started since he became a vegan, going to see dietician tomorrow NSTEMI: already on medical management, cardiologist not sure what caused the elevation on troponin, no blockage on cath   Echo:   1. Left ventricular ejection fraction, by estimation, is 40 to 45%. The  left ventricle has mildly decreased function. The left ventricle  demonstrates global hypokinesis. Left ventricular diastolic parameters  were normal.  2. Right ventricular systolic function is normal. The right ventricular  size is normal.  3. The mitral valve is normal in structure and function. Mild mitral  valve regurgitation.  4. The aortic valve is normal in structure and function. Aortic valve  regurgitation is not visualized  Cardiac Cath:    Previously placed Mid Cx drug eluting stent is widely patent.  Balloon angioplasty was performed.  Ost 1st Diag lesion is 50% stenosed.  Prox RCA lesion is 35% stenosed.  Ost LAD to Prox LAD lesion is 40% stenosed.  After discharge from [facility] he went home with a new rx of Norvasc and Isosorbide and some pain medication.  He developed dry cough , tachycardia and  elevation of bp, wife took him to [facility] on 02/15/2020 had multiple tests done, rule out PE, had some inflammation on the kidney, labs unremarkable. He went home and cardiologist stopped Imdur and he is doing well.  He sates he has been feeling fine since, had a follow up with cardiologist. On medical management.  He denies any flank pain at this time. He denies any decrease in exercise. He states no pain at this time, compliant with medications   Patient Active Problem List   Diagnosis Date Noted  . HFrEF (heart failure with reduced ejection fraction) ([place]) 02/18/2020  . Nephrolithiasis 02/18/2020  . Hospitalization within last 30 days 02/17/2020  . OSA (obstructive sleep apnea) 02/17/2020  . Right ureteral stone 02/07/2020  . Diabetes mellitus without complication ([place]) [id number]  . Chronic systolic CHF (congestive heart failure) ([place]) 02/07/2020  . Unspecified inflammatory spondylopathy, 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[place]) 01/06/2020  . Arthritis of right acromioclavicular joint 09/23/2019  . Biceps tendonitis, unspecified laterality 09/23/2019  . Bursitis of right shoulder 09/23/2019  . Incomplete tear of right rotator cuff 09/23/2019  . 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 Tachycardia 03/24/2019  . H/O non-ST elevation myocardial infarction (NSTEMI) 11/24/2018  . LVH (left ventricular hypertrophy) 11/24/2018  . 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 History of angioedema 09/01/2018  . NSTEMI (non-ST elevated myocardial infarction) ([place]) 08/22/2018  . Chronic lower back pain 08/04/2017  . Degenerative disc disease, lumbar 08/04/2017  . 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  Calcification of abdominal aorta (HCC) 08/04/2017  . Fatty liver 03/31/2017  . Elevated liver enzymes 02/11/2017  . Serum total bilirubin elevated 02/11/2017  . Hyperlipidemia LDL goal <70 01/04/2017  . Elevated serum glutamic pyruvic transaminase (SGPT) level 01/04/2017  . Angioedema   . Encounter for medication monitoring  08/25/2016  . Coronary artery disease 08/25/2016  . Uncontrolled diabetes mellitus with hyperglycemia ([place])   . Chest pain 08/08/2016  . Preventative health care 08/02/2016  . Ketonuria 07/12/2016  . Glucosuria 07/12/2016  . 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 Palpitations 08/12/2015  . Family history of malignant neoplasm of gastrointestinal tract   . Benign neoplasm of rectosigmoid junction   . Essential hypertension 05/28/2015  . Gout 05/28/2015  . IBS (irritable bowel syndrome) 05/28/2015    Past Surgical History:  Procedure Laterality Date  . ACDF with fusion  2007  . CARDIAC CATHETERIZATION Left 08/15/2016   Procedure: Left Heart Cath and Coronary Angiography;  Surgeon: Wellington Hampshire, MD;  Location: [place] [facility];  Service: Cardiovascular;  Laterality: Left;  . COLONOSCOPY WITH PROPOFOL N/A 06/26/2015   Procedure: COLONOSCOPY WITH PROPOFOL;  Surgeon: Lucilla Lame, MD;  Location: [place];  Service: Endoscopy;  Laterality: N/A;  with biopsies  . CORONARY/GRAFT ACUTE MI REVASCULARIZATION N/A 08/23/2018   Procedure: Coronary/Graft Acute MI Revascularization;  Surgeon: Isaias Cowman, MD;  Location: [place] [facility];  Service: Cardiovascular;  Laterality: N/A;  . CYSTOSCOPY W/ RETROGRADES  02/10/2020   Procedure: CYSTOSCOPY WITH RETROGRADE PYELOGRAM;  Surgeon: Billey Co, MD;  Location: [facility];  Service: Urology;;  . Consuela Mimes WITH BIOPSY N/A 11/22/2019   Procedure: CYSTOSCOPY WITH Bladder BIOPSY & Almyra Free;  Surgeon: Billey Co, MD;  Location: [facility];  Service: Urology;  Laterality: N/A;  . CYSTOSCOPY/URETEROSCOPY/HOLMIUM LASER  02/10/2020   Procedure: CYSTOSCOPY/URETEROSCOPY/HOLMIUM LASER;  Surgeon: Billey Co, MD;  Location: [facility];  Service: Urology;;  . CYSTOSCOPY/URETEROSCOPY/HOLMIUM LASER/STENT PLACEMENT Left 11/22/2019   Procedure: CYSTOSCOPY/URETEROSCOPY/BILATERAL RETROGRADE PYELOGRAM/HOLMIUM LASER/STENT PLACEMENT;  Surgeon:  Billey Co, MD;  Location: [facility];  Service: Urology;  Laterality: Left;  . LEFT HEART CATH AND CORONARY ANGIOGRAPHY N/A 08/23/2018   Procedure: LEFT HEART CATH AND CORONARY ANGIOGRAPHY;  Surgeon: Isaias Cowman, MD;  Location: [place] [facility];  Service: Cardiovascular;  Laterality: N/A;  . LEFT HEART CATH AND CORONARY ANGIOGRAPHY N/A 02/10/2020   Procedure: LEFT HEART CATH AND CORONARY ANGIOGRAPHY;  Surgeon: Corey Skains, MD;  Location: [place] [facility];  Service: Cardiovascular;  Laterality: N/A;  . NASAL SEPTUM SURGERY  2004  . SHOULDER ARTHROSCOPY W/ ROTATOR CUFF REPAIR  10/01/2019  . SHOULDER SURGERY Left 2007  . Testicular torsion  1980s  . VASECTOMY      Family History  Problem Relation Age of Onset  . Breast cancer Mother   . Skin cancer Mother   . Arrhythmia Mother        A-fib  . Cancer Mother        breast, colon?, skin  . Hypertension Father   . Diabetes Father   . Cancer Maternal Grandfather   . Heart disease Brother        stent  . Allergies Son   . Alzheimer's disease Maternal Grandmother   . Stroke Neg Hx   . COPD Neg Hx     Social History   Tobacco Use  . Smoking status: Former Smoker    Packs/day: 1.00    Years: 10.00    Pack years: 10.00  Types: Cigarettes    Quit date: 04/15/2015    Years since quitting: 4.8  . Smokeless tobacco: Never Used  Substance Use Topics  . Alcohol use: No    Alcohol/week: 0.0 standard drinks     Current Outpatient Medications:  .  ACCU-CHEK SOFTCLIX LANCETS lancets, USE AS DIRECTED, Disp: 100 each, Rfl: 0 .  allopurinol (ZYLOPRIM) 100 MG tablet, Take 100 mg by mouth daily. , Disp: , Rfl:  .  amLODipine (NORVASC) 10 MG tablet, Take 1 tablet (10 mg total) by mouth daily., Disp: 30 tablet, Rfl: 1 .  aspirin EC 81 MG tablet, Take 81 mg by mouth daily., Disp: , Rfl:  .  atorvastatin (LIPITOR) 80 MG tablet, Take 1 tablet (80 mg total) by mouth daily at 6 PM. (Patient taking differently: Take 80 mg  by mouth daily with supper. ), Disp: 30 tablet, Rfl: 0 .  BELBUCA 450 MCG FILM, Place 1 Film inside cheek 2 (two) times daily., Disp: 60 each, Rfl: 1 .  cetirizine (ZYRTEC) 10 MG tablet, Take 10 mg by mouth at bedtime., Disp: , Rfl:  .  Dapagliflozin-metFORMIN HCl ER (XIGDUO XR) 04-999 MG TB24, Take 2 tablets by mouth daily. , Disp: , Rfl:  .  diphenhydrAMINE (BENADRYL) 25 MG tablet, Take 50 mg by mouth at bedtime., Disp: , Rfl:  .  ezetimibe (ZETIA) 10 MG tablet, Take 1 tablet (10 mg total) by mouth daily., Disp: 30 tablet, Rfl: 11 .  hydrALAZINE (APRESOLINE) 25 MG tablet, Take 1 tablet (25 mg total) by mouth 2 (two) times daily., Disp: 60 tablet, Rfl: 0 .  Melatonin 5 MG TABS, Take 10 mg by mouth at bedtime. , Disp: , Rfl:  .  Menthol, Topical Analgesic, (BIOFREEZE EX), Apply 1 application topically 3 (three) times daily as needed (muscle cramps/shoulder pain.)., Disp: , Rfl:  .  metoprolol tartrate (LOPRESSOR) 25 MG tablet, Take 25 mg by mouth 2 (two) times daily., Disp: , Rfl:  .  nitroGLYCERIN (NITROSTAT) 0.4 MG SL tablet, Place 1 tablet (0.4 mg total) under the tongue every 5 (five) minutes as needed for chest pain., Disp: 30 tablet, Rfl: 0 .  ONETOUCH VERIO test strip, USE 2 (TWO) TIMES DAILY USE AS INSTRUCTED., Disp: , Rfl:  .  pregabalin (LYRICA) 50 MG capsule, Take 1 capsule (50 mg total) by mouth 3 (three) times daily., Disp: 90 capsule, Rfl: 2 .  RYBELSUS 3 MG TABS, Take 3 mg by mouth daily at 6 (six) AM. , Disp: , Rfl:  .  tamsulosin (FLOMAX) 0.4 MG CAPS capsule, Take 1 capsule (0.4 mg total) by mouth daily after supper., Disp: 30 capsule, Rfl: 1 .  tiZANidine (ZANAFLEX) 4 MG tablet, Take 1 tablet (4 mg total) by mouth every 8 (eight) hours as needed for muscle spasms., Disp: 90 tablet, Rfl: 5 .  triamcinolone cream (KENALOG) 0.1 %, Apply topically 3 (three) times daily., Disp: 30 g, Rfl: 0 .  vitamin B-12 (CYANOCOBALAMIN) 1000 MCG tablet, Take 1,000 mcg by mouth 3 (three) times a  week., Disp: , Rfl:   Allergies  Allergen Reactions  . Imdur [Isosorbide Nitrate]   . Lisinopril Swelling  . Other Hives    adhesives  . Drug [Tape] Itching    Rash, blisters  . Ace Inhibitors Swelling    I personally reviewed active problem list, medication list, allergies, family history, social history, health maintenance with the patient/caregiver today.   ROS  Constitutional: Negative for fever or weight change.  Respiratory: Negative for  cough and shortness of breath.   Cardiovascular: Negative for chest pain or palpitations.  Gastrointestinal: Negative for abdominal pain, no bowel changes.  Musculoskeletal: Negative for gait problem or joint swelling.  Skin: Negative for rash.  Neurological: Negative for dizziness or headache.  No other specific complaints in a complete review of systems (except as listed in HPI above).   Objective  Vitals:   03/04/20 0851  BP: 110/70  Pulse: 86  Resp: 16  Temp: 98.2 F (36.8 C)  TempSrc: Temporal  SpO2: 97%  Weight: 202 lb 11.2 oz (91.9 kg)  Height: 5\' 11"  (1.803 m)    Body mass index is 28.27 kg/m.  Physical Exam  Constitutional: Patient appears well-developed and well-nourished. Overweight.  No distress.  HEENT: head atraumatic, normocephalic, pupils equal and reactive to light Cardiovascular: Normal rate, regular rhythm and normal heart sounds.  No murmur heard. No BLE edema. Pulmonary/Chest: Effort normal and breath sounds normal. No respiratory distress. Abdominal: Soft.  There is no tenderness. Negative for flank  Psychiatric: Patient has a normal mood and affect. behavior is normal. Judgment and thought content normal.  Recent Results (from the past 2160 hour(s))  CBC with Differential     Status: Abnormal   Collection Time: 12/15/19  9:03 AM  Result Value Ref Range   WBC 6.9 4.0 - 10.5 K/uL   RBC 6.04 (H) 4.22 - 5.81 MIL/uL   Hemoglobin 16.0 13.0 - 17.0 g/dL   HCT 49.6 39.0 - 52.0 %   MCV 82.1 80.0 -  100.0 fL   MCH 26.5 26.0 - 34.0 pg   MCHC 32.3 30.0 - 36.0 g/dL   RDW 13.4 11.5 - 15.5 %   Platelets 220 150 - 400 K/uL   nRBC 0.0 0.0 - 0.2 %   Neutrophils Relative % 63 %   Neutro Abs 4.3 1.7 - 7.7 K/uL   Lymphocytes Relative 23 %   Lymphs Abs 1.6 0.7 - 4.0 K/uL   Monocytes Relative 8 %   Monocytes Absolute 0.5 0.1 - 1.0 K/uL   Eosinophils Relative 6 %   Eosinophils Absolute 0.4 0.0 - 0.5 K/uL   Basophils Relative 0 %   Basophils Absolute 0.0 0.0 - 0.1 K/uL   Immature Granulocytes 0 %   Abs Immature Granulocytes 0.03 0.00 - 0.07 K/uL    Comment: Performed at [facility], [address] [place], [place] [address]  Basic metabolic panel     Status: Abnormal   Collection Time: 12/15/19  9:03 AM  Result Value Ref Range   Sodium 138 135 - 145 mmol/L   Potassium 4.9 3.5 - 5.1 mmol/L   Chloride 101 98 - 111 mmol/L   CO2 27 22 - 32 mmol/L   Glucose, Bld 115 (H) 70 - 99 mg/dL   BUN 13 6 - 20 mg/dL   Creatinine, Ser 0.94 0.61 - 1.24 mg/dL   Calcium 9.8 8.9 - 10.3 mg/dL   GFR calc non Af Amer >60 >60 mL/min   GFR calc Af Amer >60 >60 mL/min   Anion gap 10 5 - 15    Comment: Performed at [facility], [address], [place], [place] [address]  Novel Coronavirus, NAA (Hosp order, Send-out to Ref Lab; TAT 18-24 hrs     Status: None   Collection Time: 12/15/19  9:22 AM   Specimen: Nasopharyngeal Swab; Respiratory  Result Value Ref Range   SARS-CoV-2, NAA NOT DETECTED NOT DETECTED    Comment: (NOTE) This  nucleic acid amplification test was developed and its performance characteristics determined by [employer]. Nucleic acid amplification tests include PCR and TMA. This test has not been FDA cleared or approved. This test has been authorized by FDA under an Emergency Use Authorization (EUA). This test is only authorized for the duration of time the declaration that circumstances exist justifying the authorization of the emergency use of in vitro  diagnostic tests for detection of SARS-CoV-2 virus and/or diagnosis of COVID-19 infection under section 564(b)(1) of the Act, 21 U.S.C. [id number]) (1), unless the authorization is terminated or revoked sooner. When diagnostic testing is negative, the possibility of a false negative result should be considered in the context of a patient's recent exposures and the presence of clinical signs and symptoms consistent with COVID-19. An individual without symptoms of COVID- 19 and who is not shedding SARS-CoV-2 vi rus would expect to have a negative (not detected) result in this assay. Performed At: [facility] [address] [place], [place] [id number] Katina Degree MDPhD [id number]    Coronavirus Source NASOPHARYNGEAL     Comment: Performed at [facility], [address] [place], [place] [address]  CMP w GFR     Status: Abnormal   Collection Time: 01/06/20 10:50 AM  Result Value Ref Range   Glucose, Bld 88 65 - 99 mg/dL    Comment: .            Fasting reference interval .    BUN 13 7 - 25 mg/dL   Creat 0.75 0.60 - 1.35 mg/dL   GFR, Est Non African American 110 > OR = 60 mL/min/1.62m2   GFR, Est African American 128 > OR = 60 mL/min/1.93m2   BUN/Creatinine Ratio NOT APPLICABLE 6 - 22 (calc)   Sodium 141 135 - 146 mmol/L   Potassium 4.5 3.5 - 5.3 mmol/L   Chloride 104 98 - 110 mmol/L   CO2 28 20 - 32 mmol/L   Calcium 10.2 8.6 - 10.3 mg/dL   Total Protein 7.3 6.1 - 8.1 g/dL   Albumin 4.8 3.6 - 5.1 g/dL   Globulin 2.5 1.9 - 3.7 g/dL (calc)   AG Ratio 1.9 1.0 - 2.5 (calc)   Total Bilirubin 1.3 (H) 0.2 - 1.2 mg/dL   Alkaline phosphatase (APISO) 81 36 - 130 U/L   AST 25 10 - 40 U/L   ALT 34 9 - 46 U/L  Lipid Panel     Status: Abnormal   Collection Time: 01/06/20 10:50 AM  Result Value Ref Range   Cholesterol 135 <200 mg/dL   HDL 33 (L) > OR = 40 mg/dL   Triglycerides 140 <150 mg/dL   LDL Cholesterol (Calc) 78 mg/dL (calc)    Comment: Reference range:  <100 . Desirable range <100 mg/dL for primary prevention;   <70 mg/dL for patients with CHD or diabetic patients  with > or = 2 CHD risk factors. Marland Kitchen LDL-C is now calculated using the Martin-Hopkins  calculation, which is a validated novel method providing  better accuracy than the Friedewald equation in the  estimation of LDL-C.  Cresenciano Genre et al. Annamaria Helling. [id number]): 2061-2068  (http://education.Quest[employer].com/faq/FAQ164)    Total CHOL/HDL Ratio 4.1 <5.0 (calc)   Non-HDL Cholesterol (Calc) 102 <130 mg/dL (calc)    Comment: For patients with diabetes plus 1 major ASCVD risk  factor, treating to a non-HDL-C goal of <100 mg/dL  (LDL-C of <70 mg/dL) is considered a therapeutic  option.   CBC w/ Diff  Status: Abnormal   Collection Time: 01/06/20 10:50 AM  Result Value Ref Range   WBC 7.1 3.8 - 10.8 Thousand/uL   RBC 6.23 (H) 4.20 - 5.80 Million/uL   Hemoglobin 16.4 13.2 - 17.1 g/dL   HCT 50.3 (H) 38.5 - 50.0 %   MCV 80.7 80.0 - 100.0 fL   MCH 26.3 (L) 27.0 - 33.0 pg   MCHC 32.6 32.0 - 36.0 g/dL   RDW 13.9 11.0 - 15.0 %   Platelets 218 140 - 400 Thousand/uL   MPV 11.0 7.5 - 12.5 fL   Neutro Abs 3,103 1,500 - 7,800 cells/uL   Lymphs Abs 2,215 850 - 3,900 cells/uL   Absolute Monocytes 667 200 - 950 cells/uL   Eosinophils Absolute 1,015 (H) 15 - 500 cells/uL   Basophils Absolute 99 0 - 200 cells/uL   Neutrophils Relative % 43.7 %   Total Lymphocyte 31.2 %   Monocytes Relative 9.4 %   Eosinophils Relative 14.3 %   Basophils Relative 1.4 %  Uric acid     Status: Abnormal   Collection Time: 01/06/20 10:50 AM  Result Value Ref Range   Uric Acid, Serum 3.6 (L) 4.0 - 8.0 mg/dL    Comment: Therapeutic target for gout patients: <6.0 mg/dL .   UA w/reflex microscopy, LAB     Status: Abnormal   Collection Time: 01/06/20 10:50 AM  Result Value Ref Range   Color, Urine BROWN (A) YELLOW   APPearance TURBID (A) CLEAR   Specific Gravity, Urine 1.039 (H) 1.001 - 1.03   pH < OR =  5.0 5.0 - 8.0   Glucose, UA 3+ (A) NEGATIVE   Bilirubin Urine NEGATIVE NEGATIVE   Ketones, ur NEGATIVE NEGATIVE   Hgb urine dipstick 3+ (A) NEGATIVE   Protein, ur 2+ (A) NEGATIVE   Nitrite NEGATIVE NEGATIVE   Leukocytes,Ua TRACE (A) NEGATIVE   WBC, UA 0-5 0 - 5 /HPF   RBC / HPF 10-20 (A) 0 - 2 /HPF   Squamous Epithelial / LPF NONE SEEN < OR = 5 /HPF   Bacteria, UA NONE SEEN NONE SEEN /HPF   Calcium Oxalate Crystal FEW NONE OR FE /HPF   Hyaline Cast NONE SEEN NONE SEEN /LPF   Urine-Other FEW MUCOUS THREADS   Urine Culture     Status: None   Collection Time: 01/06/20 10:50 AM   Specimen: Urine  Result Value Ref Range   MICRO NUMBER: [id number]    SPECIMEN QUALITY: Adequate    Sample Source URINE    STATUS: FINAL    Result: No Growth   Urinalysis, Complete w Microscopic     Status: Abnormal   Collection Time: 02/07/20  8:06 AM  Result Value Ref Range   Color, Urine YELLOW (A) YELLOW   APPearance HAZY (A) CLEAR   Specific Gravity, Urine 1.026 1.005 - 1.030   pH 5.0 5.0 - 8.0   Glucose, UA >=500 (A) NEGATIVE mg/dL   Hgb urine dipstick LARGE (A) NEGATIVE   Bilirubin Urine NEGATIVE NEGATIVE   Ketones, ur 20 (A) NEGATIVE mg/dL   Protein, ur 100 (A) NEGATIVE mg/dL   Nitrite NEGATIVE NEGATIVE   Leukocytes,Ua NEGATIVE NEGATIVE   RBC / HPF >50 (H) 0 - 5 RBC/hpf   WBC, UA 11-20 0 - 5 WBC/hpf   Bacteria, UA NONE SEEN NONE SEEN   Squamous Epithelial / LPF NONE SEEN 0 - 5   Mucus PRESENT     Comment: Performed at [facility], [address] [place], [place] [id number]  Basic metabolic panel     Status: Abnormal   Collection Time: 02/07/20  8:06 AM  Result Value Ref Range   Sodium 139 135 - 145 mmol/L   Potassium 4.0 3.5 - 5.1 mmol/L    Comment: HEMOLYSIS AT THIS LEVEL MAY AFFECT RESULT   Chloride 103 98 - 111 mmol/L   CO2 20 (L) 22 - 32 mmol/L   Glucose, Bld 190 (H) 70 - 99 mg/dL   BUN 17 6 - 20 mg/dL   Creatinine, Ser 1.01 0.61 - 1.24 mg/dL   Calcium 9.8 8.9 - 10.3  mg/dL   GFR calc non Af Amer >60 >60 mL/min   GFR calc Af Amer >60 >60 mL/min   Anion gap 16 (H) 5 - 15    Comment: Performed at [facility], [address] [place], [place] [address]  CBC     Status: Abnormal   Collection Time: 02/07/20  8:06 AM  Result Value Ref Range   WBC 9.5 4.0 - 10.5 K/uL   RBC 6.33 (H) 4.22 - 5.81 MIL/uL   Hemoglobin 16.6 13.0 - 17.0 g/dL   HCT 51.0 39.0 - 52.0 %   MCV 80.6 80.0 - 100.0 fL   MCH 26.2 26.0 - 34.0 pg   MCHC 32.5 30.0 - 36.0 g/dL   RDW 14.1 11.5 - 15.5 %   Platelets 221 150 - 400 K/uL   nRBC 0.0 0.0 - 0.2 %    Comment: Performed at [facility], [address] [place], [place] [address]  Brain natriuretic peptide     Status: None   Collection Time: 02/07/20  8:06 AM  Result Value Ref Range   B Natriuretic Peptide 53.0 0.0 - 100.0 pg/mL    Comment: Performed at [facility], [place], [place] [address]  Troponin I (High Sensitivity)     Status: None   Collection Time: 02/07/20  9:17 AM  Result Value Ref Range   Troponin I (High Sensitivity) 10 <18 ng/L    Comment: (NOTE) Elevated high sensitivity troponin I (hsTnI) values and significant  changes across serial measurements may suggest ACS but many other  chronic and acute conditions are known to elevate hsTnI results.  Refer to the "Links" section for chest pain algorithms and additional  guidance. Performed at [facility], [address] [place], [place] [address]   Troponin I (High Sensitivity)     Status: Abnormal   Collection Time: 02/07/20 11:51 AM  Result Value Ref Range   Troponin I (High Sensitivity) 1,460 (HH) <18 ng/L    Comment: CRITICAL RESULT CALLED TO, READ BACK BY AND VERIFIED WITH KATE BUMGARNER AT 1245 02/07/20.PMF (NOTE) Elevated high sensitivity troponin I (hsTnI) values and significant  changes across serial measurements may suggest ACS but many other  chronic and acute conditions are known to elevate hsTnI  results.  Refer to the "Links" section for chest pain algorithms and additional  guidance. Performed at [facility], [address] [place], [place] [address]   Respiratory Panel by RT PCR (Flu A&B, Covid) - Nasopharyngeal Swab     Status: None   Collection Time: 02/07/20  2:38 PM   Specimen: Nasopharyngeal Swab  Result Value Ref Range   SARS Coronavirus 2 by RT PCR NEGATIVE NEGATIVE    Comment: (NOTE) SARS-CoV-2 target nucleic acids are NOT DETECTED. The SARS-CoV-2 RNA is generally detectable in upper respiratoy specimens during the acute phase of infection.  The lowest concentration of SARS-CoV-2 viral copies this assay can detect is 131 copies/mL. A negative result does not preclude SARS-Cov-2 infection and should not be used as the sole basis for treatment or other patient management decisions. A negative result may occur with  improper specimen collection/handling, submission of specimen other than nasopharyngeal swab, presence of viral mutation(s) within the areas targeted by this assay, and inadequate number of viral copies (<131 copies/mL). A negative result must be combined with clinical observations, patient history, and epidemiological information. The expected result is Negative. Fact Sheet for Patients:  PinkCheek.be Fact Sheet for Healthcare Providers:  GravelBags.it This test is not yet ap proved or cleared by the [place] FDA and  has been authorized for detection and/or diagnosis of SARS-CoV-2 by FDA under an Emergency Use Authorization (EUA). This EUA will remain  in effect (meaning this test can be used) for the duration of the COVID-19 declaration under Section 564(b)(1) of the Act, 21 U.S.C. section 360bbb-3(b)(1), unless the authorization is terminated or revoked sooner.    Influenza A by PCR NEGATIVE NEGATIVE   Influenza B by PCR NEGATIVE NEGATIVE    Comment: (NOTE) The Xpert Xpress  SARS-CoV-2/FLU/RSV assay is intended as an aid in  the diagnosis of influenza from Nasopharyngeal swab specimens and  should not be used as a sole basis for treatment. Nasal washings and  aspirates are unacceptable for Xpert Xpress SARS-CoV-2/FLU/RSV  testing. Fact Sheet for Patients: PinkCheek.be Fact Sheet for Healthcare Providers: GravelBags.it This test is not yet approved or cleared by the [place] FDA and  has been authorized for detection and/or diagnosis of SARS-CoV-2 by  FDA under an Emergency Use Authorization (EUA). This EUA will remain  in effect (meaning this test can be used) for the duration of the  Covid-19 declaration under Section 564(b)(1) of the Act, 21  U.S.C. section 360bbb-3(b)(1), unless the authorization is  terminated or revoked. Performed at [facility], [address] [place], [place] [address]   APTT     Status: Abnormal   Collection Time: 02/07/20  2:38 PM  Result Value Ref Range   aPTT 102 (H) 24 - 36 seconds    Comment:        IF BASELINE aPTT IS ELEVATED, SUGGEST PATIENT RISK ASSESSMENT BE USED TO DETERMINE APPROPRIATE ANTICOAGULANT THERAPY. Performed at [facility] : [facility], [address] [place], [place] [address]   Protime-INR     Status: None   Collection Time: 02/07/20  2:38 PM  Result Value Ref Range   Prothrombin Time 14.0 11.4 - 15.2 seconds   INR 1.1 0.8 - 1.2    Comment: (NOTE) INR goal varies based on device and disease states. Performed at [facility], [address] [place], [place] [address]   HIV Antibody (routine testing w rflx)     Status: None   Collection Time: 02/07/20  4:02 PM  Result Value Ref Range   HIV Screen 4th Generation wRfx NON REACTIVE NON REACTIVE    Comment: Performed at [facility] [address] [place], [place] [address]  Troponin I (High Sensitivity)     Status: Abnormal   Collection Time: 02/07/20  4:02  PM  Result Value Ref Range   Troponin I (High Sensitivity) 9,920 (HH) <18 ng/L    Comment: CRITICAL VALUE NOTED. VALUE IS CONSISTENT WITH PREVIOUSLY REPORTED/CALLED VALUE.PMF (NOTE) Elevated high sensitivity troponin I (hsTnI) values and significant  changes across serial measurements may suggest ACS but many other  chronic and acute conditions are known to elevate hsTnI results.  Refer to the "Links" section for chest pain algorithms and additional  guidance. Performed at [facility], [address] [place], [place] [address]   Glucose, capillary     Status: Abnormal   Collection Time: 02/07/20  4:30 PM  Result Value Ref Range   Glucose-Capillary 126 (H) 70 - 99 mg/dL  Urine Drug Screen, Qualitative ([facility] only)     Status: Abnormal   Collection Time: 02/07/20  4:34 PM  Result Value Ref Range   Tricyclic, Ur Screen NONE DETECTED NONE DETECTED   Amphetamines, Ur Screen NONE DETECTED NONE DETECTED   MDMA (Ecstasy)Ur Screen NONE DETECTED NONE DETECTED   Cocaine Metabolite,Ur [place] NONE DETECTED NONE DETECTED   Opiate, Ur Screen POSITIVE (A) NONE DETECTED   Phencyclidine (PCP) Ur S NONE DETECTED NONE DETECTED   Cannabinoid 50 Ng, Ur [place] NONE DETECTED NONE DETECTED   Barbiturates, Ur Screen NONE DETECTED NONE DETECTED   Benzodiazepine, Ur Scrn POSITIVE (A) NONE DETECTED   Methadone Scn, Ur NONE DETECTED NONE DETECTED    Comment: (NOTE) Tricyclics + metabolites, urine    Cutoff 1000 ng/mL Amphetamines + metabolites, urine  Cutoff 1000 ng/mL MDMA (Ecstasy), urine              Cutoff 500 ng/mL Cocaine Metabolite, urine          Cutoff 300 ng/mL Opiate + metabolites, urine        Cutoff 300 ng/mL Phencyclidine (PCP), urine         Cutoff 25 ng/mL Cannabinoid, urine                 Cutoff 50 ng/mL Barbiturates + metabolites, urine  Cutoff 200 ng/mL Benzodiazepine, urine              Cutoff 200 ng/mL Methadone, urine                   Cutoff 300 ng/mL The urine drug screen  provides only a preliminary, unconfirmed analytical test result and should not be used for non-medical purposes. Clinical consideration and professional judgment should be applied to any positive drug screen result due to possible interfering substances. A more specific alternate chemical method must be used in order to obtain a confirmed analytical result. Gas chromatography / mass spectrometry (GC/MS) is the preferred confirmat ory method. Performed at [facility], [place], [place] [address]   Heparin level (unfractionated)     Status: None   Collection Time: 02/07/20  8:15 PM  Result Value Ref Range   Heparin Unfractionated 0.55 0.30 - 0.70 IU/mL    Comment: (NOTE) If heparin results are below expected values, and patient dosage has  been confirmed, suggest follow up testing of antithrombin III levels. Performed at [facility], [place], [place] [address]   Troponin I (High Sensitivity)     Status: Abnormal   Collection Time: 02/07/20  8:15 PM  Result Value Ref Range   Troponin I (High Sensitivity) 14,856 (HH) <18 ng/L    Comment: CRITICAL VALUE NOTED. VALUE IS CONSISTENT WITH PREVIOUSLY REPORTED/CALLED VALUE [place] (NOTE) Elevated high sensitivity troponin I (hsTnI) values and significant  changes across serial measurements may suggest ACS but many other  chronic and acute conditions are known to elevate hsTnI results.  Refer to the "Links" section for chest pain algorithms and additional  guidance. Performed at [facility], [address],  [place], [place] [address]   Glucose, capillary     Status: Abnormal   Collection Time: 02/07/20  9:29 PM  Result Value Ref Range   Glucose-Capillary 144 (H) 70 - 99 mg/dL  Troponin I (High Sensitivity)     Status: Abnormal   Collection Time: 02/07/20  9:46 PM  Result Value Ref Range   Troponin I (High Sensitivity) 17,310 (HH) <18 ng/L    Comment: CRITICAL VALUE NOTED.  VALUE IS CONSISTENT WITH PREVIOUSLY REPORTED/CALLED VALUE [place] (NOTE) Elevated high sensitivity troponin I (hsTnI) values and significant  changes across serial measurements may suggest ACS but many other  chronic and acute conditions are known to elevate hsTnI results.  Refer to the "Links" section for chest pain algorithms and additional  guidance. Performed at [facility], [address] [place], [place] [address]   CBC     Status: Abnormal   Collection Time: 02/08/20  2:07 AM  Result Value Ref Range   WBC 11.0 (H) 4.0 - 10.5 K/uL   RBC 6.07 (H) 4.22 - 5.81 MIL/uL   Hemoglobin 15.8 13.0 - 17.0 g/dL   HCT 48.8 39.0 - 52.0 %   MCV 80.4 80.0 - 100.0 fL   MCH 26.0 26.0 - 34.0 pg   MCHC 32.4 30.0 - 36.0 g/dL   RDW 14.4 11.5 - 15.5 %   Platelets 174 150 - 400 K/uL   nRBC 0.0 0.0 - 0.2 %    Comment: Performed at [facility], [address] [place], [place] [address]  Hemoglobin A1c     Status: Abnormal   Collection Time: 02/08/20  2:07 AM  Result Value Ref Range   Hgb A1c MFr Bld 6.4 (H) 4.8 - 5.6 %    Comment: (NOTE) Pre diabetes:          5.7%-6.4% Diabetes:              >6.4% Glycemic control for   <7.0% adults with diabetes    Mean Plasma Glucose 136.98 mg/dL    Comment: Performed at [facility] [address] [place], [place] [address]  Lipid panel     Status: Abnormal   Collection Time: 02/08/20  2:07 AM  Result Value Ref Range   Cholesterol 145 0 - 200 mg/dL   Triglycerides 84 <150 mg/dL   HDL 33 (L) >40 mg/dL   Total CHOL/HDL Ratio 4.4 RATIO   VLDL 17 0 - 40 mg/dL   LDL Cholesterol 95 0 - 99 mg/dL    Comment:        Total Cholesterol/HDL:CHD Risk Coronary Heart Disease Risk Table                     Men   Women  1/2 Average Risk   3.4   3.3  Average Risk       5.0   4.4  2 X Average Risk   9.6   7.1  3 X Average Risk  23.4   11.0        Use the calculated Patient Ratio above and the CHD Risk Table to determine the patient's  CHD Risk.        ATP III CLASSIFICATION (LDL):  <100     mg/dL   Optimal  100-129  mg/dL   Near or Above                    Optimal  130-159  mg/dL   Borderline  160-189  mg/dL   High  >190     mg/dL   Very High Performed at [facility], [place], [place] [address]   Heparin level (unfractionated)     Status: None   Collection Time: 02/08/20  2:07 AM  Result Value Ref Range   Heparin Unfractionated 0.47 0.30 - 0.70 IU/mL    Comment: (NOTE) If heparin results are below expected values, and patient dosage has  been confirmed, suggest follow up testing of antithrombin III levels. Performed at [facility], [address] [place], [place] [address]   Glucose, capillary     Status: Abnormal   Collection Time: 02/08/20  8:04 AM  Result Value Ref Range   Glucose-Capillary 129 (H) 70 - 99 mg/dL  Troponin I (High Sensitivity)     Status: Abnormal   Collection Time: 02/08/20  8:34 AM  Result Value Ref Range   Troponin I (High Sensitivity) 7,799 (HH) <18 ng/L    Comment: CRITICAL VALUE NOTED. VALUE IS CONSISTENT WITH PREVIOUSLY REPORTED/CALLED VALUE / [place] (NOTE) Elevated high sensitivity troponin I (hsTnI) values and significant  changes across serial measurements may suggest ACS but many other  chronic and acute conditions are known to elevate hsTnI results.  Refer to the "Links" section for chest pain algorithms and additional  guidance. Performed at [facility], [address] [place], [place] [address]   ECHOCARDIOGRAM COMPLETE     Status: None   Collection Time: 02/08/20 10:26 AM  Result Value Ref Range   Weight 3,120 oz   Height 71 in   BP 146/115 mmHg  Glucose, capillary     Status: Abnormal   Collection Time: 02/08/20 12:19 PM  Result Value Ref Range   Glucose-Capillary 153 (H) 70 - 99 mg/dL  Glucose, capillary     Status: Abnormal   Collection Time: 02/08/20  5:27 PM  Result Value Ref Range   Glucose-Capillary 130 (H)  70 - 99 mg/dL  Glucose, capillary     Status: Abnormal   Collection Time: 02/08/20  9:03 PM  Result Value Ref Range   Glucose-Capillary 137 (H) 70 - 99 mg/dL  Heparin level (unfractionated)     Status: None   Collection Time: 02/09/20  5:33 AM  Result Value Ref Range   Heparin Unfractionated 0.47 0.30 - 0.70 IU/mL    Comment: (NOTE) If heparin results are below expected values, and patient dosage has  been confirmed, suggest follow up testing of antithrombin III levels. Performed at [facility], [address] [place], [place] [address]   CBC     Status: Abnormal   Collection Time: 02/09/20  5:33 AM  Result Value Ref Range   WBC 7.7 4.0 - 10.5 K/uL   RBC 5.69 4.22 - 5.81 MIL/uL   Hemoglobin 14.8 13.0 - 17.0 g/dL   HCT 46.5 39.0 - 52.0 %   MCV 81.7 80.0 - 100.0 fL   MCH 26.0 26.0 - 34.0 pg   MCHC 31.8 30.0 - 36.0 g/dL   RDW 14.5 11.5 - 15.5 %   Platelets 149 (L) 150 - 400 K/uL   nRBC 0.0 0.0 - 0.2 %    Comment: Performed at [facility], [address] [place],  [id number]  Basic metabolic panel     Status: Abnormal   Collection Time: 02/09/20  5:33 AM  Result Value Ref Range   Sodium 139 135 - 145 mmol/L   Potassium 3.7 3.5 - 5.1 mmol/L  Chloride 105 98 - 111 mmol/L   CO2 28 22 - 32 mmol/L   Glucose, Bld 139 (H) 70 - 99 mg/dL   BUN 15 6 - 20 mg/dL   Creatinine, Ser 1.40 (H) 0.61 - 1.24 mg/dL   Calcium 8.8 (L) 8.9 - 10.3 mg/dL   GFR calc non Af Amer 60 (L) >60 mL/min   GFR calc Af Amer >60 >60 mL/min   Anion gap 6 5 - 15    Comment: Performed at [facility], [address] [place], [place] [address]  Glucose, capillary     Status: Abnormal   Collection Time: 02/09/20  7:35 AM  Result Value Ref Range   Glucose-Capillary 124 (H) 70 - 99 mg/dL  Glucose, capillary     Status: Abnormal   Collection Time: 02/09/20 11:49 AM  Result Value Ref Range   Glucose-Capillary 130 (H) 70 - 99 mg/dL  Glucose, capillary     Status: Abnormal    Collection Time: 02/09/20  4:32 PM  Result Value Ref Range   Glucose-Capillary 146 (H) 70 - 99 mg/dL  Glucose, capillary     Status: Abnormal   Collection Time: 02/09/20 10:14 PM  Result Value Ref Range   Glucose-Capillary 156 (H) 70 - 99 mg/dL  Heparin level (unfractionated)     Status: None   Collection Time: 02/10/20  4:59 AM  Result Value Ref Range   Heparin Unfractionated 0.30 0.30 - 0.70 IU/mL    Comment: (NOTE) If heparin results are below expected values, and patient dosage has  been confirmed, suggest follow up testing of antithrombin III levels. Performed at [facility], [address] [place], [place] [id number]   Basic metabolic panel     Status: Abnormal   Collection Time: 02/10/20  4:59 AM  Result Value Ref Range   Sodium 138 135 - 145 mmol/L   Potassium 4.1 3.5 - 5.1 mmol/L   Chloride 104 98 - 111 mmol/L   CO2 25 22 - 32 mmol/L   Glucose, Bld 144 (H) 70 - 99 mg/dL   BUN 12 6 - 20 mg/dL   Creatinine, Ser 1.32 (H) 0.61 - 1.24 mg/dL   Calcium 9.2 8.9 - 10.3 mg/dL   GFR calc non Af Amer >60 >60 mL/min   GFR calc Af Amer >60 >60 mL/min   Anion gap 9 5 - 15    Comment: Performed at [facility], [address] [place], [place] [address]  CBC     Status: Abnormal   Collection Time: 02/10/20  4:59 AM  Result Value Ref Range   WBC 8.2 4.0 - 10.5 K/uL   RBC 5.94 (H) 4.22 - 5.81 MIL/uL   Hemoglobin 16.0 13.0 - 17.0 g/dL   HCT 48.7 39.0 - 52.0 %   MCV 82.0 80.0 - 100.0 fL   MCH 26.9 26.0 - 34.0 pg   MCHC 32.9 30.0 - 36.0 g/dL   RDW 14.3 11.5 - 15.5 %   Platelets 167 150 - 400 K/uL   nRBC 0.0 0.0 - 0.2 %    Comment: Performed at [facility], [address] [place], [place] [address]  Magnesium     Status: None   Collection Time: 02/10/20  4:59 AM  Result Value Ref Range   Magnesium 2.0 1.7 - 2.4 mg/dL    Comment: Performed at [facility], [address] [place], [place] [address]  Glucose, capillary     Status:  Abnormal   Collection  Time: 02/10/20  8:54 AM  Result Value Ref Range   Glucose-Capillary 138 (H) 70 - 99 mg/dL  Glucose, capillary     Status: Abnormal   Collection Time: 02/10/20 10:57 AM  Result Value Ref Range   Glucose-Capillary 121 (H) 70 - 99 mg/dL  Glucose, capillary     Status: Abnormal   Collection Time: 02/10/20  1:54 PM  Result Value Ref Range   Glucose-Capillary 133 (H) 70 - 99 mg/dL  Glucose, capillary     Status: Abnormal   Collection Time: 02/10/20  3:40 PM  Result Value Ref Range   Glucose-Capillary 135 (H) 70 - 99 mg/dL  Glucose, capillary     Status: Abnormal   Collection Time: 02/10/20  4:54 PM  Result Value Ref Range   Glucose-Capillary 151 (H) 70 - 99 mg/dL  Glucose, capillary     Status: Abnormal   Collection Time: 02/10/20  9:03 PM  Result Value Ref Range   Glucose-Capillary 246 (H) 70 - 99 mg/dL   Comment 1 Notify RN   Basic metabolic panel     Status: Abnormal   Collection Time: 02/11/20  5:20 AM  Result Value Ref Range   Sodium 135 135 - 145 mmol/L   Potassium 4.6 3.5 - 5.1 mmol/L   Chloride 103 98 - 111 mmol/L   CO2 23 22 - 32 mmol/L   Glucose, Bld 188 (H) 70 - 99 mg/dL   BUN 18 6 - 20 mg/dL   Creatinine, Ser 1.25 (H) 0.61 - 1.24 mg/dL   Calcium 8.7 (L) 8.9 - 10.3 mg/dL   GFR calc non Af Amer >60 >60 mL/min   GFR calc Af Amer >60 >60 mL/min   Anion gap 9 5 - 15    Comment: Performed at [facility], [address] [place], [place] [address]  CBC     Status: Abnormal   Collection Time: 02/11/20  5:20 AM  Result Value Ref Range   WBC 10.8 (H) 4.0 - 10.5 K/uL   RBC 5.22 4.22 - 5.81 MIL/uL   Hemoglobin 13.8 13.0 - 17.0 g/dL   HCT 42.2 39.0 - 52.0 %   MCV 80.8 80.0 - 100.0 fL   MCH 26.4 26.0 - 34.0 pg   MCHC 32.7 30.0 - 36.0 g/dL   RDW 14.1 11.5 - 15.5 %   Platelets 165 150 - 400 K/uL   nRBC 0.0 0.0 - 0.2 %    Comment: Performed at [facility], [address] [place], [place] [address]  Magnesium     Status: None    Collection Time: 02/11/20  5:20 AM  Result Value Ref Range   Magnesium 2.3 1.7 - 2.4 mg/dL    Comment: Performed at [facility], [address] [place], [place] [address]  Glucose, capillary     Status: Abnormal   Collection Time: 02/11/20  8:04 AM  Result Value Ref Range   Glucose-Capillary 171 (H) 70 - 99 mg/dL  Glucose, capillary     Status: Abnormal   Collection Time: 02/11/20 11:50 AM  Result Value Ref Range   Glucose-Capillary 218 (H) 70 - 99 mg/dL  Glucose, capillary     Status: Abnormal   Collection Time: 02/11/20  4:29 PM  Result Value Ref Range   Glucose-Capillary 151 (H) 70 - 99 mg/dL  Glucose, capillary     Status: Abnormal   Collection Time: 02/11/20  9:22 PM  Result Value Ref Range   Glucose-Capillary 187 (H) 70 -  99 mg/dL  Basic metabolic panel     Status: Abnormal   Collection Time: 02/12/20  5:43 AM  Result Value Ref Range   Sodium 136 135 - 145 mmol/L   Potassium 4.2 3.5 - 5.1 mmol/L   Chloride 104 98 - 111 mmol/L   CO2 25 22 - 32 mmol/L   Glucose, Bld 209 (H) 70 - 99 mg/dL   BUN 21 (H) 6 - 20 mg/dL   Creatinine, Ser 1.28 (H) 0.61 - 1.24 mg/dL   Calcium 8.8 (L) 8.9 - 10.3 mg/dL   GFR calc non Af Amer >60 >60 mL/min   GFR calc Af Amer >60 >60 mL/min   Anion gap 7 5 - 15    Comment: Performed at [facility], [address] [place], [place] [address]  CBC     Status: Abnormal   Collection Time: 02/12/20  5:43 AM  Result Value Ref Range   WBC 10.2 4.0 - 10.5 K/uL   RBC 5.21 4.22 - 5.81 MIL/uL   Hemoglobin 13.5 13.0 - 17.0 g/dL   HCT 42.2 39.0 - 52.0 %   MCV 81.0 80.0 - 100.0 fL   MCH 25.9 (L) 26.0 - 34.0 pg   MCHC 32.0 30.0 - 36.0 g/dL   RDW 14.3 11.5 - 15.5 %   Platelets 181 150 - 400 K/uL   nRBC 0.0 0.0 - 0.2 %    Comment: Performed at [facility], [address] [place], [place] [address]  Magnesium     Status: None   Collection Time: 02/12/20  5:43 AM  Result Value Ref Range   Magnesium 2.3 1.7 - 2.4 mg/dL     Comment: Performed at [facility], [address] [place], [place] [address]  Glucose, capillary     Status: Abnormal   Collection Time: 02/12/20  8:12 AM  Result Value Ref Range   Glucose-Capillary 158 (H) 70 - 99 mg/dL     PHQ2/9: Depression screen [facility] 2/9 03/04/2020 01/06/2020 12/23/2019 03/28/2019 12/06/2018  Decreased Interest 0 0 0 0 0  Down, Depressed, Hopeless 0 0 0 0 0  PHQ - 2 Score 0 0 0 0 0  Altered sleeping 0 0 0 0 1  Tired, decreased energy 0 1 1 0 3  Change in appetite 0 1 1 0 0  Feeling bad or failure about yourself  0 0 0 0 0  Trouble concentrating 0 0 0 0 0  Moving slowly or fidgety/restless 0 0 0 0 0  Suicidal thoughts 0 0 0 0 0  PHQ-9 Score 0 2 2 0 4  Difficult doing work/chores - Not difficult at all Not difficult at all Not difficult at all Not difficult at all  Some recent data might be hidden    phq 9 is negative   Fall Risk: Fall Risk  03/04/2020 01/06/2020 12/23/2019 03/28/2019 12/06/2018  Falls in the past year? 0 0 0 0 0  Comment - - - - -  Number falls in past yr: 0 0 0 0 -  Injury with Fall? 0 0 0 0 -  Follow up - - Falls evaluation completed - -      Assessment & Plan  1. NSTEMI (non-ST elevated myocardial infarction) ([place])  History of MI in the past and has a stent that on recent cardiac cath was patent , on statin , LDL is at goal, on aspirin   2. Recurrent kidney stones  He is under the care of Urologist  and will see a dietician today   3. Essential hypertension  At goal, denies dizziness

## 2020-03-05 ENCOUNTER — Encounter: Payer: Self-pay | Admitting: Skilled Nursing Facility1

## 2020-03-05 ENCOUNTER — Encounter: Payer: 59 | Attending: Cardiology | Admitting: Skilled Nursing Facility1

## 2020-03-05 DIAGNOSIS — Z955 Presence of coronary angioplasty implant and graft: Secondary | ICD-10-CM | POA: Diagnosis not present

## 2020-03-05 DIAGNOSIS — Z7984 Long term (current) use of oral hypoglycemic drugs: Secondary | ICD-10-CM | POA: Insufficient documentation

## 2020-03-05 DIAGNOSIS — G4733 Obstructive sleep apnea (adult) (pediatric): Secondary | ICD-10-CM | POA: Diagnosis not present

## 2020-03-05 DIAGNOSIS — N200 Calculus of kidney: Secondary | ICD-10-CM | POA: Insufficient documentation

## 2020-03-05 DIAGNOSIS — Z79899 Other long term (current) drug therapy: Secondary | ICD-10-CM | POA: Diagnosis not present

## 2020-03-05 DIAGNOSIS — Z803 Family history of malignant neoplasm of breast: Secondary | ICD-10-CM | POA: Insufficient documentation

## 2020-03-05 DIAGNOSIS — Z888 Allergy status to other drugs, medicaments and biological substances status: Secondary | ICD-10-CM | POA: Insufficient documentation

## 2020-03-05 DIAGNOSIS — I119 Hypertensive heart disease without heart failure: Secondary | ICD-10-CM | POA: Insufficient documentation

## 2020-03-05 DIAGNOSIS — E119 Type 2 diabetes mellitus without complications: Secondary | ICD-10-CM | POA: Diagnosis not present

## 2020-03-05 DIAGNOSIS — K219 Gastro-esophageal reflux disease without esophagitis: Secondary | ICD-10-CM | POA: Insufficient documentation

## 2020-03-05 DIAGNOSIS — M109 Gout, unspecified: Secondary | ICD-10-CM | POA: Insufficient documentation

## 2020-03-05 DIAGNOSIS — Z800 Family history of malignant neoplasm of digestive organs: Secondary | ICD-10-CM | POA: Insufficient documentation

## 2020-03-05 DIAGNOSIS — Z87891 Personal history of nicotine dependence: Secondary | ICD-10-CM | POA: Diagnosis not present

## 2020-03-05 DIAGNOSIS — Z8249 Family history of ischemic heart disease and other diseases of the circulatory system: Secondary | ICD-10-CM | POA: Diagnosis not present

## 2020-03-05 DIAGNOSIS — Z833 Family history of diabetes mellitus: Secondary | ICD-10-CM | POA: Diagnosis not present

## 2020-03-05 DIAGNOSIS — E785 Hyperlipidemia, unspecified: Secondary | ICD-10-CM | POA: Insufficient documentation

## 2020-03-05 DIAGNOSIS — Z808 Family history of malignant neoplasm of other organs or systems: Secondary | ICD-10-CM | POA: Diagnosis not present

## 2020-03-05 DIAGNOSIS — Z713 Dietary counseling and surveillance: Secondary | ICD-10-CM | POA: Insufficient documentation

## 2020-03-05 DIAGNOSIS — I252 Old myocardial infarction: Secondary | ICD-10-CM | POA: Diagnosis not present

## 2020-03-05 NOTE — Progress Notes (Signed)
  Assessment:  Primary concerns today: kidney stones.   Pt arrives with his wife who he says helps take care of him.  Pt states he has had diabetes for abut 3 years with an A1C of 6.5.  Pt states he does have GERD which he takes famotidine daily. Pt states he just recently had a stress heart attack a few weeks ago triggered by a kidney stone.  Pt states he does have IBS with no episode in the last year.  Pt state she is very depressed due to his health problems. Pt states until 5 years ago he was a healthy athlete and in the [place] then discovered he has spinal issues and sees his body declining. Pt states August 29th had a heart attack. Pt states due to his spinal issues along with arthritis have limited his physical activity. Pt states after watching some documentaries decided to practice veganism starting January 1st 2020. Pt states in Novemebr found a kidney stone. Pt states in February found another kidney stone.  Pt states his wife is gluten free and he is not but because she follows that he is also.  Pt states they do "purple carrots".  Pt states he thinks maybe he wants to liberalize his vegan diet already including cheese and does eat desserts with milk and eggs in it.    MEDICATIONS: See List   DIETARY INTAKE:  Usual eating pattern includes 3 meals and 2 snacks per day.  Everyday foods include none stated.  Avoided foods include meat, eggs, butter.   Drinking oatmilk 24-hr recall:  B ( AM): oatmeal with syrup, cinn and nuts and cranberries or granola cereal with oatmilk Snk ( AM): apple or grapes or soy yogurt or pickles or nuts L ( PM): salad kit from store and nuts with apple or herballife shake plant based nuggets  Snk ( PM): cheerios or nuts D ( PM): corn chowder soup or stir fry with brown rice in sauce  Snk ( PM):  Beverages: diet mountain dew, crystal light, green tea  Usual physical activity: ADL's    Intervention:  Nutrition counseling. Balanced diet  appropriate for type 2 diabetes, chronic kidney stones, and veganism.  Goals: 310m calcium with each meal; take a calcium citrate supplement [id number] times a day Limit high oxelate foods to every other day: nuts, spinach, rhubarb, beets, potato chips, Fr[place]ries Probiotic to eat oxalate: be sure to be cold and have several different strains  Do not take vitamin c supplement  Lemon or lime juice 2 ounces diluted 2 times a day Aim for 100 fluid ounces: limit crystal light to 32 ounces, for the rest: 2 ounces lemon juice or lime juice 2 times a day; herbal teas, infused waters: berries, mint Add in ground flax seed daily and fish 2 times a week Add in B12 supplement [id number] your physical therapist what yoga exercises would be appropriate   Teaching Method Utilized:  Visual Auditory Hands on  Barriers to learning/adherence to lifestyle change: none identified   Demonstrated degree of understanding via:  Teach Back   Monitoring/Evaluation:  Dietary intake, exercise,  and body weight

## 2020-03-10 ENCOUNTER — Other Ambulatory Visit: Payer: Self-pay

## 2020-03-10 ENCOUNTER — Other Ambulatory Visit: Payer: 59

## 2020-03-18 ENCOUNTER — Other Ambulatory Visit: Payer: Self-pay | Admitting: Urology

## 2020-03-19 ENCOUNTER — Other Ambulatory Visit: Payer: Self-pay

## 2020-03-19 DIAGNOSIS — I214 Non-ST elevation (NSTEMI) myocardial infarction: Secondary | ICD-10-CM

## 2020-03-19 NOTE — Progress Notes (Signed)
Virtual Orientation performed. Patient informed when to come in for RD and EP orientation. Diagnosis can be found in [facility] 03/04/2020.

## 2020-03-30 ENCOUNTER — Other Ambulatory Visit: Payer: Self-pay

## 2020-03-30 ENCOUNTER — Encounter: Payer: 59 | Attending: Cardiology

## 2020-03-30 VITALS — Ht 71.25 in | Wt 203.4 lb

## 2020-03-30 DIAGNOSIS — I119 Hypertensive heart disease without heart failure: Secondary | ICD-10-CM | POA: Diagnosis not present

## 2020-03-30 DIAGNOSIS — Z87891 Personal history of nicotine dependence: Secondary | ICD-10-CM | POA: Insufficient documentation

## 2020-03-30 DIAGNOSIS — Z800 Family history of malignant neoplasm of digestive organs: Secondary | ICD-10-CM | POA: Insufficient documentation

## 2020-03-30 DIAGNOSIS — I252 Old myocardial infarction: Secondary | ICD-10-CM | POA: Insufficient documentation

## 2020-03-30 DIAGNOSIS — Z79899 Other long term (current) drug therapy: Secondary | ICD-10-CM | POA: Diagnosis not present

## 2020-03-30 DIAGNOSIS — E119 Type 2 diabetes mellitus without complications: Secondary | ICD-10-CM | POA: Insufficient documentation

## 2020-03-30 DIAGNOSIS — M109 Gout, unspecified: Secondary | ICD-10-CM | POA: Insufficient documentation

## 2020-03-30 DIAGNOSIS — Z808 Family history of malignant neoplasm of other organs or systems: Secondary | ICD-10-CM | POA: Diagnosis not present

## 2020-03-30 DIAGNOSIS — E785 Hyperlipidemia, unspecified: Secondary | ICD-10-CM | POA: Insufficient documentation

## 2020-03-30 DIAGNOSIS — Z955 Presence of coronary angioplasty implant and graft: Secondary | ICD-10-CM | POA: Insufficient documentation

## 2020-03-30 DIAGNOSIS — Z8249 Family history of ischemic heart disease and other diseases of the circulatory system: Secondary | ICD-10-CM | POA: Diagnosis not present

## 2020-03-30 DIAGNOSIS — K219 Gastro-esophageal reflux disease without esophagitis: Secondary | ICD-10-CM | POA: Insufficient documentation

## 2020-03-30 DIAGNOSIS — G4733 Obstructive sleep apnea (adult) (pediatric): Secondary | ICD-10-CM | POA: Diagnosis not present

## 2020-03-30 DIAGNOSIS — Z888 Allergy status to other drugs, medicaments and biological substances status: Secondary | ICD-10-CM | POA: Insufficient documentation

## 2020-03-30 DIAGNOSIS — N200 Calculus of kidney: Secondary | ICD-10-CM | POA: Insufficient documentation

## 2020-03-30 DIAGNOSIS — I214 Non-ST elevation (NSTEMI) myocardial infarction: Secondary | ICD-10-CM

## 2020-03-30 DIAGNOSIS — Z713 Dietary counseling and surveillance: Secondary | ICD-10-CM | POA: Insufficient documentation

## 2020-03-30 DIAGNOSIS — Z803 Family history of malignant neoplasm of breast: Secondary | ICD-10-CM | POA: Insufficient documentation

## 2020-03-30 DIAGNOSIS — Z7984 Long term (current) use of oral hypoglycemic drugs: Secondary | ICD-10-CM | POA: Diagnosis not present

## 2020-03-30 DIAGNOSIS — Z833 Family history of diabetes mellitus: Secondary | ICD-10-CM | POA: Insufficient documentation

## 2020-03-30 NOTE — Progress Notes (Signed)
Cardiac Individual Treatment Plan  Patient Details  Name: John Hartman MRN: [id number] Date of Birth: 11-10-1971 Referring Provider:     Cardiac Rehab from 03/30/2020 in [facility] Cardiac and Pulmonary Rehab  Referring Provider  Paraschos      Initial Encounter Date:    Cardiac Rehab from 03/30/2020 in [facility] Cardiac and Pulmonary Rehab  Date  03/30/20      Visit Diagnosis: NSTEMI (non-ST elevated myocardial infarction) [facility])  Patient's Home Medications on Admission:  Current Outpatient Medications:  .  ACCU-CHEK SOFTCLIX LANCETS lancets, USE AS DIRECTED, Disp: 100 each, Rfl: 0 .  allopurinol (ZYLOPRIM) 100 MG tablet, Take 100 mg by mouth daily. , Disp: , Rfl:  .  amLODipine (NORVASC) 10 MG tablet, Take 1 tablet (10 mg total) by mouth daily., Disp: 30 tablet, Rfl: 1 .  aspirin EC 81 MG tablet, Take 81 mg by mouth daily., Disp: , Rfl:  .  atorvastatin (LIPITOR) 80 MG tablet, Take 1 tablet (80 mg total) by mouth daily at 6 PM. (Patient taking differently: Take 80 mg by mouth daily with supper. ), Disp: 30 tablet, Rfl: 0 .  BELBUCA 450 MCG FILM, Place 1 Film inside cheek 2 (two) times daily., Disp: 60 each, Rfl: 1 .  cetirizine (ZYRTEC) 10 MG tablet, Take 10 mg by mouth at bedtime., Disp: , Rfl:  .  Dapagliflozin-metFORMIN HCl ER (XIGDUO XR) 04-999 MG TB24, Take 2 tablets by mouth daily. , Disp: , Rfl:  .  diphenhydrAMINE (BENADRYL) 25 MG tablet, Take 50 mg by mouth at bedtime., Disp: , Rfl:  .  ezetimibe (ZETIA) 10 MG tablet, Take 1 tablet (10 mg total) by mouth daily., Disp: 30 tablet, Rfl: 11 .  hydrALAZINE (APRESOLINE) 25 MG tablet, Take 1 tablet (25 mg total) by mouth 2 (two) times daily., Disp: 60 tablet, Rfl: 0 .  Melatonin 5 MG TABS, Take 10 mg by mouth at bedtime. , Disp: , Rfl:  .  Menthol, Topical Analgesic, (BIOFREEZE EX), Apply 1 application topically 3 (three) times daily as needed (muscle cramps/shoulder pain.)., Disp: , Rfl:  .  metoprolol tartrate (LOPRESSOR) 25 MG tablet, Take 25  mg by mouth 2 (two) times daily., Disp: , Rfl:  .  nitroGLYCERIN (NITROSTAT) 0.4 MG SL tablet, Place 1 tablet (0.4 mg total) under the tongue every 5 (five) minutes as needed for chest pain., Disp: 30 tablet, Rfl: 0 .  ONETOUCH VERIO test strip, USE 2 (TWO) TIMES DAILY USE AS INSTRUCTED., Disp: , Rfl:  .  pregabalin (LYRICA) 50 MG capsule, Take 1 capsule (50 mg total) by mouth 3 (three) times daily., Disp: 90 capsule, Rfl: 2 .  RYBELSUS 3 MG TABS, Take 3 mg by mouth daily at 6 (six) AM. , Disp: , Rfl:  .  tamsulosin (FLOMAX) 0.4 MG CAPS capsule, Take 1 capsule (0.4 mg total) by mouth daily after supper. (Patient not taking: Reported on 03/19/2020), Disp: 30 capsule, Rfl: 1 .  tiZANidine (ZANAFLEX) 4 MG tablet, Take 1 tablet (4 mg total) by mouth every 8 (eight) hours as needed for muscle spasms., Disp: 90 tablet, Rfl: 5 .  triamcinolone cream (KENALOG) 0.1 %, Apply topically 3 (three) times daily. (Patient not taking: Reported on 03/19/2020), Disp: 30 g, Rfl: 0 .  vitamin B-12 (CYANOCOBALAMIN) 1000 MCG tablet, Take 1,000 mcg by mouth 3 (three) times a week., Disp: , Rfl:   Past Medical History: Past Medical History:  Diagnosis Date  . Angioedema   . Bronchitis   . DDD (degenerative  disc disease), cervical   . Diabetes mellitus ([place])   . Fatty liver 03/31/2017   [place] April 2018  . GERD (gastroesophageal reflux disease)   . Gout   . Heart attack ([place])   . HTN (hypertension)   . Hypertension    CONTROLLED ON MEDS  . IBS (irritable bowel syndrome)   . Kidney stone   . LVH (left ventricular hypertrophy) 11/24/2018   Moderate, ECHO  . Myocardial infarction ([place]) 08/23/2018  . Seasonal allergies   . Spinal stenosis     Tobacco Use: Social History   Tobacco Use  Smoking Status Former Smoker  . Packs/day: 1.00  . Years: 10.00  . Pack years: 10.00  . Types: Cigarettes  . Quit date: 04/15/2003  . Years since quitting: 16.9  Smokeless Tobacco Never Used    Labs: Recent Review Flowsheet  Data    Labs for ITP Cardiac and Pulmonary Rehab Latest Ref Rng & Units 02/14/2019 03/28/2019 10/18/2019 01/06/2020 02/08/2020   Cholestrol 0 - 200 mg/dL 110 103 - 135 145   LDLCALC 0 - 99 mg/dL 59 58 - 78 95   HDL >40 mg/dL 32(L) 25(L) - 33(L) 33(L)   Trlycerides <150 mg/dL 108 116 - 140 84   Hemoglobin A1c 4.8 - 5.6 % 6.1(H) - 7.2 - 6.4(H)       Exercise Target Goals: Exercise Program Goal: Individual exercise prescription set using results from initial 6 min walk test and THRR while considering  patient's activity barriers and safety.   Exercise Prescription Goal: Initial exercise prescription builds to 30-45 minutes a day of aerobic activity, 2-3 days per week.  Home exercise guidelines will be given to patient during program as part of exercise prescription that the participant will acknowledge.   Education: Aerobic Exercise & Resistance Training: - Gives group verbal and written instruction on the various components of exercise. Focuses on aerobic and resistive training programs and the benefits of this training and how to safely progress through these programs..   Cardiac Rehab from 10/31/2018 in [facility] Cardiac and Pulmonary Rehab  Date  09/24/18  Educator  [facility]  Instruction Review Code  1- [employer] Understanding      Education: Exercise & Equipment Safety: - Individual verbal instruction and demonstration of equipment use and safety with use of the equipment.   Cardiac Rehab from 03/30/2020 in [facility] Cardiac and Pulmonary Rehab  Date  03/30/20  Educator  AS  Instruction Review Code  1- Verbalizes Understanding      Education: Exercise Physiology & General Exercise Guidelines: - Group verbal and written instruction with models to review the exercise physiology of the cardiovascular system and associated critical values. Provides general exercise guidelines with specific guidelines to those with heart or lung disease.    Cardiac Rehab from 10/31/2018 in [facility] Cardiac and Pulmonary  Rehab  Date  09/19/18  Educator  [facility]  Instruction Review Code  1- Verbalizes Understanding      Education: Flexibility, Balance, Mind/Body Relaxation: Provides group verbal/written instruction on the benefits of flexibility and balance training, including mind/body exercise modes such as yoga, pilates and tai chi.  Demonstration and skill practice provided.   Cardiac Rehab from 10/31/2018 in [facility] Cardiac and Pulmonary Rehab  Date  10/01/18  Educator  AS  Instruction Review Code  1- Verbalizes Understanding      Activity Barriers & Risk Stratification:   6 Minute Walk: 6 Minute Walk    Row Name 03/30/20 [phone]  6 Minute Walk   Phase  Initial     Distance  1905 feet     Walk Time  6 minutes     # of Rest Breaks  0     MPH  3.6     METS  4.25     RPE  12     Perceived Dyspnea   1     VO2 Peak  14.9     Symptoms  No     Resting HR  93 bpm     Resting BP  114/76     Resting Oxygen Saturation   97 %     Exercise Oxygen Saturation  during 6 min walk  97 %     Max Ex. HR  129 bpm     Max Ex. BP  128/74     2 Minute Post BP  120/68        Oxygen Initial Assessment:   Oxygen Re-Evaluation:   Oxygen Discharge (Final Oxygen Re-Evaluation):   Initial Exercise Prescription: Initial Exercise Prescription - 03/30/20 1600      Date of Initial Exercise RX and Referring Provider   Date  03/30/20    Referring Provider  Paraschos      Treadmill   MPH  3.6    Grade  1    Minutes  15    METs  4.25      Elliptical   Level  1    Speed  3    Minutes  15    METs  4      REL-XR   Level  4    Speed  50    Minutes  15    METs  4      Prescription Details   Frequency (times per week)  3    Duration  Progress to 30 minutes of continuous aerobic without signs/symptoms of physical distress      Intensity   THRR 40-80% of Max Heartrate  125-157    Ratings of Perceived Exertion  11-15    Perceived Dyspnea  0-4      Resistance Training   Training Prescription   Yes    Weight  3 lb    Reps  10-15       Perform Capillary Blood Glucose checks as needed.  Exercise Prescription Changes: Exercise Prescription Changes    Row Name 03/30/20 1600             Response to Exercise   Blood Pressure (Admit)  114/76       Blood Pressure (Exercise)  128/74       Blood Pressure (Exit)  120/68       Heart Rate (Admit)  93 bpm       Heart Rate (Exercise)  125 bpm       Heart Rate (Exit)  95 bpm       Rating of Perceived Exertion (Exercise)  12       Perceived Dyspnea (Exercise)  1       Symptoms  none          Exercise Comments:   Exercise Goals and Review: Exercise Goals    Row Name 03/30/20 1626             Exercise Goals   Increase Physical Activity  Yes       Intervention  Provide advice, education, support and counseling about physical activity/exercise needs.;Develop an individualized exercise prescription for aerobic and resistive training  based on initial evaluation findings, risk stratification, comorbidities and participant's personal goals.       Expected Outcomes  Short Term: Attend rehab on a regular basis to increase amount of physical activity.;Long Term: Add in home exercise to make exercise part of routine and to increase amount of physical activity.;Long Term: Exercising regularly at least 3-5 days a week.       Increase Strength and Stamina  Yes       Intervention  Provide advice, education, support and counseling about physical activity/exercise needs.;Develop an individualized exercise prescription for aerobic and resistive training based on initial evaluation findings, risk stratification, comorbidities and participant's personal goals.       Expected Outcomes  Short Term: Increase workloads from initial exercise prescription for resistance, speed, and METs.;Short Term: Perform resistance training exercises routinely during rehab and add in resistance training at home;Long Term: Improve cardiorespiratory fitness, muscular  endurance and strength as measured by increased METs and functional capacity (6MWT)       Able to understand and use rate of perceived exertion (RPE) scale  Yes       Intervention  Provide education and explanation on how to use RPE scale       Expected Outcomes  Short Term: Able to use RPE daily in rehab to express subjective intensity level;Long Term:  Able to use RPE to guide intensity level when exercising independently       Able to understand and use Dyspnea scale  Yes       Intervention  Provide education and explanation on how to use Dyspnea scale       Expected Outcomes  Short Term: Able to use Dyspnea scale daily in rehab to express subjective sense of shortness of breath during exertion;Long Term: Able to use Dyspnea scale to guide intensity level when exercising independently       Knowledge and understanding of Target Heart Rate Range (THRR)  Yes       Intervention  Provide education and explanation of THRR including how the numbers were predicted and where they are located for reference       Expected Outcomes  Short Term: Able to state/look up THRR;Short Term: Able to use daily as guideline for intensity in rehab;Long Term: Able to use THRR to govern intensity when exercising independently       Able to check pulse independently  Yes       Intervention  Provide education and demonstration on how to check pulse in carotid and radial arteries.;Review the importance of being able to check your own pulse for safety during independent exercise       Expected Outcomes  Short Term: Able to explain why pulse checking is important during independent exercise;Long Term: Able to check pulse independently and accurately       Understanding of Exercise Prescription  Yes       Intervention  Provide education, explanation, and written materials on patient's individual exercise prescription       Expected Outcomes  Short Term: Able to explain program exercise prescription;Long Term: Able to explain  home exercise prescription to exercise independently          Exercise Goals Re-Evaluation :   Discharge Exercise Prescription (Final Exercise Prescription Changes): Exercise Prescription Changes - 03/30/20 1600      Response to Exercise   Blood Pressure (Admit)  114/76    Blood Pressure (Exercise)  128/74    Blood Pressure (Exit)  120/68    Heart  Rate (Admit)  93 bpm    Heart Rate (Exercise)  125 bpm    Heart Rate (Exit)  95 bpm    Rating of Perceived Exertion (Exercise)  12    Perceived Dyspnea (Exercise)  1    Symptoms  none       Nutrition:  Target Goals: Understanding of nutrition guidelines, daily intake of sodium <1542m, cholesterol <2013m calories 30% from fat and 7% or less from saturated fats, daily to have 5 or more servings of fruits and vegetables.  Education: Controlling Sodium/Reading Food Labels -Group verbal and written material supporting the discussion of sodium use in heart healthy nutrition. Review and explanation with models, verbal and written materials for utilization of the food label.   [facility]  Date  10/31/18  Educator  LB  Instruction Review Code  1- Verbalizes Understanding      Education: General Nutrition Guidelines/Fats and Fiber: -Group instruction provided by verbal, written material, models and posters to present the general guidelines for heart healthy nutrition. Gives an explanation and review of dietary fats and fiber.   [facility]  Date  10/29/18  Educator  LB  Instruction Review Code  1- Verbalizes Understanding      Biometrics: Pre Biometrics - 03/30/20 1627      Pre Biometrics   Height  5' 11.25" (1.81 m)    Weight  203 lb 6.4 oz (92.3 kg)    BMI (Calculated)  28.16    Single Leg Stand  30 seconds        Nutrition Therapy Plan and Nutrition Goals:   Nutrition Assessments: Nutrition Assessments - 03/30/20  1624      MEDFICTS Scores   Pre Score  0       MEDIFICTS Score Key:          ?70 Need to make dietary changes          40-70 Heart Healthy Diet         ? 40 Therapeutic Level Cholesterol Diet  Nutrition Goals Re-Evaluation:   Nutrition Goals Discharge (Final Nutrition Goals Re-Evaluation):   Psychosocial: Target Goals: Acknowledge presence or absence of significant depression and/or stress, maximize coping skills, provide positive support system. Participant is able to verbalize types and ability to use techniques and skills needed for reducing stress and depression.   Education: Depression - Provides group verbal and written instruction on the correlation between heart/lung disease and depressed mood, treatment options, and the stigmas associated with seeking treatment.   Education: Sleep Hygiene -Provides group verbal and written instruction about how sleep can affect your health.  Define sleep hygiene, discuss sleep cycles and impact of sleep habits. Review good sleep hygiene tips.     Education: Stress and Anxiety: - Provides group verbal and written instruction about the health risks of elevated stress and causes of high stress.  Discuss the correlation between heart/lung disease and anxiety and treatment options. Review healthy ways to manage with stress and anxiety.   [facility]  Date  10/10/18  Educator  KaLucianne LeiMSW  Instruction Review Code  1- Verbalizes Understanding       Initial Review & Psychosocial Screening: Initial Psych Review & Screening - 03/19/20 0944      Initial Review   Current issues with  None Identified      Family Dynamics  Good Support System?  Yes    Comments  He can look to his parents, brother and wife for support.      Barriers   Psychosocial barriers to participate in program  There are no identifiable barriers or psychosocial needs.;The patient should benefit from  training in stress management and relaxation.      Screening Interventions   Interventions  Encouraged to exercise;Program counselor consult;To provide support and resources with identified psychosocial needs;Provide feedback about the scores to participant    Expected Outcomes  Short Term goal: Utilizing psychosocial counselor, staff and physician to assist with identification of specific Stressors or current issues interfering with healing process. Setting desired goal for each stressor or current issue identified.;Long Term Goal: Stressors or current issues are controlled or eliminated.;Short Term goal: Identification and review with participant of any Quality of Life or Depression concerns found by scoring the questionnaire.;Long Term goal: The participant improves quality of Life and PHQ9 Scores as seen by post scores and/or verbalization of changes       Quality of Life Scores:  Quality of Life - 03/30/20 1623      Quality of Life   Select  Quality of Life      Quality of Life Scores   Health/Function Pre  20.13 %    Socioeconomic Pre  22.71 %    Psych/Spiritual Pre  22.14 %    Family Pre  29.5 %    GLOBAL Pre  22.46 %      Scores of 19 and below usually indicate a poorer quality of life in these areas.  A difference of  2-3 points is a clinically meaningful difference.  A difference of 2-3 points in the total score of the Quality of Life Index has been associated with significant improvement in overall quality of life, self-image, physical symptoms, and general health in studies assessing change in quality of life.  PHQ-9: Recent Review Flowsheet Data    Depression screen [facility] 2/9 03/30/2020 03/04/2020 01/06/2020 12/23/2019 03/28/2019   Decreased Interest 0 0 0 0 0   Down, Depressed, Hopeless 0 0 0 0 0   PHQ - 2 Score 0 0 0 0 0   Altered sleeping 0 0 0 0 0   Tired, decreased energy 1 0 1 1 0   Change in appetite 0 0 1 1 0   Feeling bad or failure about yourself  0 0 0 0 0   Trouble  concentrating 0 0 0 0 0   Moving slowly or fidgety/restless 0 0 0 0 0   Suicidal thoughts 0 0 0 0 0   PHQ-9 Score 1 0 2 2 0   Difficult doing work/chores Somewhat difficult - Not difficult at all Not difficult at all Not difficult at all     Interpretation of Total Score  Total Score Depression Severity:  1-4 = Minimal depression, 5-9 = Mild depression, 10-14 = Moderate depression, 15-19 = Moderately severe depression, 20-27 = Severe depression   Psychosocial Evaluation and Intervention: Psychosocial Evaluation - 03/19/20 0949      Psychosocial Evaluation & Interventions   Interventions  Encouraged to exercise with the program and follow exercise prescription    Comments  [name] has done the program before and has a positive outlook on his health    Expected Outcomes  Short: Attend HeartTrack stress management education to decrease stress. Long: Maintain exercise Post HeartTrack to keep stress at a minimum.    Continue Psychosocial Services   Follow  up required by staff       Psychosocial Re-Evaluation:   Psychosocial Discharge (Final Psychosocial Re-Evaluation):   Vocational Rehabilitation: Provide vocational rehab assistance to qualifying candidates.   Vocational Rehab Evaluation & Intervention:   Education: Education Goals: Education classes will be provided on a variety of topics geared toward better understanding of heart health and risk factor modification. Participant will state understanding/return demonstration of topics presented as noted by education test scores.  Learning Barriers/Preferences: Learning Barriers/Preferences - 03/19/20 0947      Learning Barriers/Preferences   Learning Barriers  None    Learning Preferences  None       General Cardiac Education Topics:  AED/CPR: - Group verbal and written instruction with the use of models to demonstrate the basic use of the AED with the basic ABC's of resuscitation.   Cardiac Rehab from 10/31/2018 in [facility]  Cardiac and Pulmonary Rehab  Date  10/24/18  Educator  CE  Instruction Review Code  1- Verbalizes Understanding      Anatomy & Physiology of the Heart: - Group verbal and written instruction and models provide basic cardiac anatomy and physiology, with the coronary electrical and arterial systems. Review of Valvular disease and Heart Failure   Cardiac Rehab from 10/31/2018 in [facility] Cardiac and Pulmonary Rehab  Date  10/08/18  Educator  MA  Instruction Review Code  1- Verbalizes Understanding      Cardiac Procedures: - Group verbal and written instruction to review commonly prescribed medications for heart disease. Reviews the medication, class of the drug, and side effects. Includes the steps to properly store meds and maintain the prescription regimen. (beta blockers and nitrates)   Cardiac Rehab from 10/31/2018 in [facility] Cardiac and Pulmonary Rehab  Date  10/22/18  Educator  CE  Instruction Review Code  1- Verbalizes Understanding      Cardiac Medications I: - Group verbal and written instruction to review commonly prescribed medications for heart disease. Reviews the medication, class of the drug, and side effects. Includes the steps to properly store meds and maintain the prescription regimen.   Cardiac Rehab from 10/31/2018 in [facility] Cardiac and Pulmonary Rehab  Date  10/15/18  Educator  CE  Instruction Review Code  1- Verbalizes Understanding      Cardiac Medications II: -Group verbal and written instruction to review commonly prescribed medications for heart disease. Reviews the medication, class of the drug, and side effects. (all other drug classes)   Cardiac Rehab from 10/31/2018 in [facility] Cardiac and Pulmonary Rehab  Date  10/03/18  Educator  C.Enterkin, RN  Instruction Review Code  1- Verbalizes Understanding       Go Sex-Intimacy & Heart Disease, Get SMART - Goal Setting: - Group verbal and written instruction through game format to discuss heart disease and the return  to sexual intimacy. Provides group verbal and written material to discuss and apply goal setting through the application of the S.M.A.R.T. Method.   Cardiac Rehab from 10/31/2018 in [facility] Cardiac and Pulmonary Rehab  Date  10/22/18  Educator  CE  Instruction Review Code  1- Verbalizes Understanding      Other Matters of the Heart: - Provides group verbal, written materials and models to describe Stable Angina and Peripheral Artery. Includes description of the disease process and treatment options available to the cardiac patient.   Infection Prevention: - Provides verbal and written material to individual with discussion of infection control including proper hand washing and proper equipment cleaning during exercise session.  Cardiac Rehab from 03/30/2020 in [facility] Cardiac and Pulmonary Rehab  Date  03/30/20  Educator  AS  Instruction Review Code  1- Verbalizes Understanding      Falls Prevention: - Provides verbal and written material to individual with discussion of falls prevention and safety.   Cardiac Rehab from 03/30/2020 in [facility] Cardiac and Pulmonary Rehab  Date  03/30/20  Educator  AS  Instruction Review Code  1- Verbalizes Understanding      Other: -Provides group and verbal instruction on various topics (see comments)   Knowledge Questionnaire Score: Knowledge Questionnaire Score - 03/30/20 1625      Knowledge Questionnaire Score   Pre Score  25/26       Core Components/Risk Factors/Patient Goals at Admission: Personal Goals and Risk Factors at Admission - 03/30/20 1625      Core Components/Risk Factors/Patient Goals on Admission    Weight Management  Yes;Weight Maintenance    Intervention  Weight Management: Develop a combined nutrition and exercise program designed to reach desired caloric intake, while maintaining appropriate intake of nutrient and fiber, sodium and fats, and appropriate energy expenditure required for the weight goal.;Weight Management: Provide  education and appropriate resources to help participant work on and attain dietary goals.    Admit Weight  203 lb 6.4 oz (92.3 kg)    Goal Weight: Short Term  200 lb (90.7 kg)    Goal Weight: Long Term  200 lb (90.7 kg)    Expected Outcomes  Short Term: Continue to assess and modify interventions until short term weight is achieved;Long Term: Adherence to nutrition and physical activity/exercise program aimed toward attainment of established weight goal;Weight Maintenance: Understanding of the daily nutrition guidelines, which includes 25-35% calories from fat, 7% or less cal from saturated fats, less than 253m cholesterol, less than 1.5gm of sodium, & 5 or more servings of fruits and vegetables daily;Weight Loss: Understanding of general recommendations for a balanced deficit meal plan, which promotes 1-2 lb weight loss per week and includes a negative energy balance of [phone] kcal/d;Understanding recommendations for meals to include 15-35% energy as protein, 25-35% energy from fat, 35-60% energy from carbohydrates, less than 2051mof dietary cholesterol, 20-35 gm of total fiber daily;Understanding of distribution of calorie intake throughout the day with the consumption of 4-5 meals/snacks    Diabetes  Yes    Intervention  Provide education about signs/symptoms and action to take for hypo/hyperglycemia.;Provide education about proper nutrition, including hydration, and aerobic/resistive exercise prescription along with prescribed medications to achieve blood glucose in normal ranges: Fasting glucose 65-99 mg/dL    Expected Outcomes  Short Term: Participant verbalizes understanding of the signs/symptoms and immediate care of hyper/hypoglycemia, proper foot care and importance of medication, aerobic/resistive exercise and nutrition plan for blood glucose control.;Long Term: Attainment of HbA1C < 7%.    Intervention  Provide a combined exercise and nutrition program that is supplemented with education,  support and counseling about heart failure. Directed toward relieving symptoms such as shortness of breath, decreased exercise tolerance, and extremity edema.    Expected Outcomes  Improve functional capacity of life;Short term: Attendance in program 2-3 days a week with increased exercise capacity. Reported lower sodium intake. Reported increased fruit and vegetable intake. Reports medication compliance.;Long term: Adoption of self-care skills and reduction of barriers for early signs and symptoms recognition and intervention leading to self-care maintenance.;Short term: Daily weights obtained and reported for increase. Utilizing diuretic protocols set by physician.    Intervention  Provide education on  lifestyle modifcations including regular physical activity/exercise, weight management, moderate sodium restriction and increased consumption of fresh fruit, vegetables, and low fat dairy, alcohol moderation, and smoking cessation.;Monitor prescription use compliance.    Intervention  Provide education and support for participant on nutrition & aerobic/resistive exercise along with prescribed medications to achieve LDL <40m, HDL >442m    Expected Outcomes  Short Term: Participant states understanding of desired cholesterol values and is compliant with medications prescribed. Participant is following exercise prescription and nutrition guidelines.;Long Term: Cholesterol controlled with medications as prescribed, with individualized exercise RX and with personalized nutrition plan. Value goals: LDL < 7022mHDL > 40 mg.       Education:Diabetes - Individual verbal and written instruction to review signs/symptoms of diabetes, desired ranges of glucose level fasting, after meals and with exercise. Acknowledge that pre and post exercise glucose checks will be done for 3 sessions at entry of program.   [facility]  Date  03/19/20  [facility]  [facility]  Review Code  1- Verbalizes Understanding      Education: Know Your Numbers and Risk Factors: -Group verbal and written instruction about important numbers in your health.  Discussion of what are risk factors and how they play a role in the disease process.  Review of Cholesterol, Blood Pressure, Diabetes, and BMI and the role they play in your overall health.   [facility]  Date  10/03/18  Educator  C.Enterkin, RN  Instruction Review Code  1- VerCounselling psychologistals Review:    Core Components/Risk Factors/Patient Goals at Discharge (Final Review):    ITP Comments: ITP Comments    Row Name 03/19/20 [phone]        ITP Comments  Virtual Orientation performed. Patient informed when to come in for RD and EP orientation. [facility]          Comments: initial ITP

## 2020-03-30 NOTE — Patient Instructions (Signed)
Patient Instructions  Patient Details  Name: John Hartman MRN: [id number] Date of Birth: 05-16-1971 Referring Provider:  Isaias Cowman, MD  Below are your personal goals for exercise, nutrition, and risk factors. Our goal is to help you stay on track towards obtaining and maintaining these goals. We will be discussing your progress on these goals with you throughout the program.  Initial Exercise Prescription: Initial Exercise Prescription - 03/30/20 1600      Date of Initial Exercise RX and Referring Provider   Date  03/30/20    Referring Provider  Paraschos      Treadmill   MPH  3.6    Grade  1    Minutes  15    METs  4.25      Elliptical   Level  1    Speed  3    Minutes  15    METs  4      REL-XR   Level  4    Speed  50    Minutes  15    METs  4      Prescription Details   Frequency (times per week)  3    Duration  Progress to 30 minutes of continuous aerobic without signs/symptoms of physical distress      Intensity   THRR 40-80% of Max Heartrate  125-157    Ratings of Perceived Exertion  11-15    Perceived Dyspnea  0-4      Resistance Training   Training Prescription  Yes    Weight  3 lb    Reps  10-15       Exercise Goals: Frequency: Be able to perform aerobic exercise two to three times per week in program working toward 2-5 days per week of home exercise.  Intensity: Work with a perceived exertion of 11 (fairly light) - 15 (hard) while following your exercise prescription.  We will make changes to your prescription with you as you progress through the program.   Duration: Be able to do 30 to 45 minutes of continuous aerobic exercise in addition to a 5 minute warm-up and a 5 minute cool-down routine.   Nutrition Goals: Your personal nutrition goals will be established when you do your nutrition analysis with the dietician.  The following are general nutrition guidelines to follow: Cholesterol < 200mg /day Sodium < 1500mg /day Fiber: Men under  50 yrs - 38 grams per day  Personal Goals: Personal Goals and Risk Factors at Admission - 03/30/20 1625      Core Components/Risk Factors/Patient Goals on Admission    Weight Management  Yes;Weight Maintenance    Intervention  Weight Management: Develop a combined nutrition and exercise program designed to reach desired caloric intake, while maintaining appropriate intake of nutrient and fiber, sodium and fats, and appropriate energy expenditure required for the weight goal.;Weight Management: Provide education and appropriate resources to help participant work on and attain dietary goals.    Admit Weight  203 lb 6.4 oz (92.3 kg)    Goal Weight: Short Term  200 lb (90.7 kg)    Goal Weight: Long Term  200 lb (90.7 kg)    Expected Outcomes  Short Term: Continue to assess and modify interventions until short term weight is achieved;Long Term: Adherence to nutrition and physical activity/exercise program aimed toward attainment of established weight goal;Weight Maintenance: Understanding of the daily nutrition guidelines, which includes 25-35% calories from fat, 7% or less cal from saturated fats, less than 200mg  cholesterol, less than 1.5gm of  sodium, & 5 or more servings of fruits and vegetables daily;Weight Loss: Understanding of general recommendations for a balanced deficit meal plan, which promotes 1-2 lb weight loss per week and includes a negative energy balance of [phone] kcal/d;Understanding recommendations for meals to include 15-35% energy as protein, 25-35% energy from fat, 35-60% energy from carbohydrates, less than 200mg  of dietary cholesterol, 20-35 gm of total fiber daily;Understanding of distribution of calorie intake throughout the day with the consumption of 4-5 meals/snacks    Diabetes  Yes    Intervention  Provide education about signs/symptoms and action to take for hypo/hyperglycemia.;Provide education about proper nutrition, including hydration, and aerobic/resistive exercise  prescription along with prescribed medications to achieve blood glucose in normal ranges: Fasting glucose 65-99 mg/dL    Expected Outcomes  Short Term: Participant verbalizes understanding of the signs/symptoms and immediate care of hyper/hypoglycemia, proper foot care and importance of medication, aerobic/resistive exercise and nutrition plan for blood glucose control.;Long Term: Attainment of HbA1C < 7%.    Intervention  Provide a combined exercise and nutrition program that is supplemented with education, support and counseling about heart failure. Directed toward relieving symptoms such as shortness of breath, decreased exercise tolerance, and extremity edema.    Expected Outcomes  Improve functional capacity of life;Short term: Attendance in program 2-3 days a week with increased exercise capacity. Reported lower sodium intake. Reported increased fruit and vegetable intake. Reports medication compliance.;Long term: Adoption of self-care skills and reduction of barriers for early signs and symptoms recognition and intervention leading to self-care maintenance.;Short term: Daily weights obtained and reported for increase. Utilizing diuretic protocols set by physician.    Intervention  Provide education on lifestyle modifcations including regular physical activity/exercise, weight management, moderate sodium restriction and increased consumption of fresh fruit, vegetables, and low fat dairy, alcohol moderation, and smoking cessation.;Monitor prescription use compliance.    Intervention  Provide education and support for participant on nutrition & aerobic/resistive exercise along with prescribed medications to achieve LDL 70mg , HDL >40mg .    Expected Outcomes  Short Term: Participant states understanding of desired cholesterol values and is compliant with medications prescribed. Participant is following exercise prescription and nutrition guidelines.;Long Term: Cholesterol controlled with medications as  prescribed, with individualized exercise RX and with personalized nutrition plan. Value goals: LDL < 70mg , HDL > 40 mg.       Tobacco Use Initial Evaluation: Social History   Tobacco Use  Smoking Status Former Smoker  . Packs/day: 1.00  . Years: 10.00  . Pack years: 10.00  . Types: Cigarettes  . Quit date: 04/15/2003  . Years since quitting: 16.9  Smokeless Tobacco Never Used    Exercise Goals and Review: Exercise Goals    Row Name 03/30/20 1626             Exercise Goals   Increase Physical Activity  Yes       Intervention  Provide advice, education, support and counseling about physical activity/exercise needs.;Develop an individualized exercise prescription for aerobic and resistive training based on initial evaluation findings, risk stratification, comorbidities and participant's personal goals.       Expected Outcomes  Short Term: Attend rehab on a regular basis to increase amount of physical activity.;Long Term: Add in home exercise to make exercise part of routine and to increase amount of physical activity.;Long Term: Exercising regularly at least 3-5 days a week.       Increase Strength and Stamina  Yes       Intervention  Provide advice,  education, support and counseling about physical activity/exercise needs.;Develop an individualized exercise prescription for aerobic and resistive training based on initial evaluation findings, risk stratification, comorbidities and participant's personal goals.       Expected Outcomes  Short Term: Increase workloads from initial exercise prescription for resistance, speed, and METs.;Short Term: Perform resistance training exercises routinely during rehab and add in resistance training at home;Long Term: Improve cardiorespiratory fitness, muscular endurance and strength as measured by increased METs and functional capacity (6MWT)       Able to understand and use rate of perceived exertion (RPE) scale  Yes       Intervention  Provide  education and explanation on how to use RPE scale       Expected Outcomes  Short Term: Able to use RPE daily in rehab to express subjective intensity level;Long Term:  Able to use RPE to guide intensity level when exercising independently       Able to understand and use Dyspnea scale  Yes       Intervention  Provide education and explanation on how to use Dyspnea scale       Expected Outcomes  Short Term: Able to use Dyspnea scale daily in rehab to express subjective sense of shortness of breath during exertion;Long Term: Able to use Dyspnea scale to guide intensity level when exercising independently       Knowledge and understanding of Target Heart Rate Range (THRR)  Yes       Intervention  Provide education and explanation of THRR including how the numbers were predicted and where they are located for reference       Expected Outcomes  Short Term: Able to state/look up THRR;Short Term: Able to use daily as guideline for intensity in rehab;Long Term: Able to use THRR to govern intensity when exercising independently       Able to check pulse independently  Yes       Intervention  Provide education and demonstration on how to check pulse in carotid and radial arteries.;Review the importance of being able to check your own pulse for safety during independent exercise       Expected Outcomes  Short Term: Able to explain why pulse checking is important during independent exercise;Long Term: Able to check pulse independently and accurately       Understanding of Exercise Prescription  Yes       Intervention  Provide education, explanation, and written materials on patient's individual exercise prescription       Expected Outcomes  Short Term: Able to explain program exercise prescription;Long Term: Able to explain home exercise prescription to exercise independently          Copy of goals given to participant.

## 2020-03-31 ENCOUNTER — Ambulatory Visit: Payer: No Typology Code available for payment source | Admitting: Urology

## 2020-04-06 ENCOUNTER — Encounter: Payer: 59 | Admitting: *Deleted

## 2020-04-06 ENCOUNTER — Other Ambulatory Visit: Payer: Self-pay

## 2020-04-06 DIAGNOSIS — Z955 Presence of coronary angioplasty implant and graft: Secondary | ICD-10-CM

## 2020-04-06 DIAGNOSIS — I214 Non-ST elevation (NSTEMI) myocardial infarction: Secondary | ICD-10-CM

## 2020-04-06 DIAGNOSIS — Z713 Dietary counseling and surveillance: Secondary | ICD-10-CM | POA: Diagnosis not present

## 2020-04-06 LAB — GLUCOSE, CAPILLARY: Glucose-Capillary: 106 mg/dL — ABNORMAL HIGH (ref 70–99)

## 2020-04-06 NOTE — Progress Notes (Signed)
Daily Session Note  Patient Details  Name: Steve Devora MRN: [id number] Date of Birth: 01/11/1971 Referring Provider:     Cardiac Rehab from 03/30/2020 in [facility]  Referring Provider  Paraschos      Encounter Date: 04/06/2020  Check In: Session Check In - 04/06/20 1712      Check-In   Supervising physician immediately available to respond to emergencies  See telemetry face sheet for immediately available ER MD    Location  [facility] & Pulmonary Rehab    Staff Present   , RN BSN;Kelly Hayes, BS, ACSM CEP, Exercise Physiologist;Joseph Hood RCP,RRT,BSRT    Virtual Visit  No    Medication changes reported      No    Fall or balance concerns reported     No    Warm-up and Cool-down  Performed on first and last piece of equipment    Resistance Training Performed  Yes    VAD Patient?  No    PAD/SET Patient?  No      Pain Assessment   Currently in Pain?  No/denies          Social History   Tobacco Use  Smoking Status Former Smoker  . Packs/day: 1.00  . Years: 10.00  . Pack years: 10.00  . Types: Cigarettes  . Quit date: 04/15/2003  . Years since quitting: 16.9  Smokeless Tobacco Never Used    Goals Met:  Independence with exercise equipment Exercise tolerated well No report of cardiac concerns or symptoms Strength training completed today  Goals Unmet:  Not Applicable  Comments: First full day of exercise!  Patient was oriented to gym and equipment including functions, settings, policies, and procedures.  Patient's individual exercise prescription and treatment plan were reviewed.  All starting workloads were established based on the results of the 6 minute walk test done at initial orientation visit.  The plan for exercise progression was also introduced and progression will be customized based on patient's performance and goals.    Dr. Mark Miller is Medical Director for [facility] and [name]. 

## 2020-04-07 ENCOUNTER — Encounter: Payer: Self-pay | Admitting: Urology

## 2020-04-07 ENCOUNTER — Ambulatory Visit: Payer: 59 | Admitting: Urology

## 2020-04-07 VITALS — BP 135/92 | HR 86 | Ht 71.0 in | Wt 202.0 lb

## 2020-04-07 DIAGNOSIS — N200 Calculus of kidney: Secondary | ICD-10-CM | POA: Diagnosis not present

## 2020-04-07 LAB — GLUCOSE, CAPILLARY: Glucose-Capillary: 125 mg/dL — ABNORMAL HIGH (ref 70–99)

## 2020-04-07 NOTE — Patient Instructions (Addendum)
Dietary Guidelines to Help Prevent Kidney Stones Kidney stones are deposits of minerals and salts that form inside your kidneys. Your risk of developing kidney stones may be greater depending on your diet, your lifestyle, the medicines you take, and whether you have certain medical conditions. Most people can reduce their chances of developing kidney stones by following the instructions below. Depending on your overall health and the type of kidney stones you tend to develop, your dietitian may give you more specific instructions. What are tips for following this plan? Reading food labels  Choose foods with "no salt added" or "low-salt" labels. Limit your sodium intake to less than 1500 mg per day.  Choose foods with calcium for each meal and snack. Try to eat about 300 mg of calcium at each meal. Foods that contain 200-500 mg of calcium per serving include: ? 8 oz (237 ml) of milk, fortified nondairy milk, and fortified fruit juice. ? 8 oz (237 ml) of kefir, yogurt, and soy yogurt. ? 4 oz (118 ml) of tofu. ? 1 oz of cheese. ? 1 cup (300 g) of dried figs. ? 1 cup (91 g) of cooked broccoli. ? 1-3 oz can of sardines or mackerel.  Most people need 1000 to 1500 mg of calcium each day. Talk to your dietitian about how much calcium is recommended for you. Shopping  Buy plenty of fresh fruits and vegetables. Most people do not need to avoid fruits and vegetables, even if they contain nutrients that may contribute to kidney stones.  When shopping for convenience foods, choose: ? Whole pieces of fruit. ? Premade salads with dressing on the side. ? Low-fat fruit and yogurt smoothies.  Avoid buying frozen meals or prepared deli foods.  Look for foods with live cultures, such as yogurt and kefir. Cooking  Do not add salt to food when cooking. Place a salt shaker on the table and allow each person to add his or her own salt to taste.  Use vegetable protein, such as beans, textured vegetable  protein (TVP), or tofu instead of meat in pasta, casseroles, and soups. Meal planning   Eat less salt, if told by your dietitian. To do this: ? Avoid eating processed or premade food. ? Avoid eating fast food.  Eat less animal protein, including cheese, meat, poultry, or fish, if told by your dietitian. To do this: ? Limit the number of times you have meat, poultry, fish, or cheese each week. Eat a diet free of meat at least 2 days a week. ? Eat only one serving each day of meat, poultry, fish, or seafood. ? When you prepare animal protein, cut pieces into small portion sizes. For most meat and fish, one serving is about the size of one deck of cards.  Eat at least 5 servings of fresh fruits and vegetables each day. To do this: ? Keep fruits and vegetables on hand for snacks. ? Eat 1 piece of fruit or a handful of berries with breakfast. ? Have a salad and fruit at lunch. ? Have two kinds of vegetables at dinner.  Limit foods that are high in a substance called oxalate. These include: ? Spinach. ? Rhubarb. ? Beets. ? Potato chips and french fries. ? Nuts.  If you regularly take a diuretic medicine, make sure to eat at least 1-2 fruits or vegetables high in potassium each day. These include: ? Avocado. ? Banana. ? Orange, prune, carrot, or tomato juice. ? Baked potato. ? Cabbage. ? Beans and split   peas. General instructions   Drink enough fluid to keep your urine clear or pale yellow. This is the most important thing you can do.  Talk to your health care provider and dietitian about taking daily supplements. Depending on your health and the cause of your kidney stones, you may be advised: ? Not to take supplements with vitamin C. ? To take a calcium supplement. ? To take a daily probiotic supplement. ? To take other supplements such as magnesium, fish oil, or vitamin B6.  Take all medicines and supplements as told by your health care provider.  Limit alcohol intake to no  more than 1 drink a day for nonpregnant women and 2 drinks a day for men. One drink equals 12 oz of beer, 5 oz of wine, or 1 oz of hard liquor.  Lose weight if told by your health care provider. Work with your dietitian to find strategies and an eating plan that works best for you. What foods are not recommended? Limit your intake of the following foods, or as told by your dietitian. Talk to your dietitian about specific foods you should avoid based on the type of kidney stones and your overall health. Grains Breads. Bagels. Rolls. Baked goods. Salted crackers. Cereal. Pasta. Vegetables Spinach. Rhubarb. Beets. Canned vegetables. Angie Fava. Olives. Meats and other protein foods Nuts. Nut butters. Large portions of meat, poultry, or fish. Salted or cured meats. Deli meats. Hot dogs. Sausages. Dairy Cheese. Beverages Regular soft drinks. Regular vegetable juice. Seasonings and other foods Seasoning blends with salt. Salad dressings. Canned soups. Soy sauce. Ketchup. Barbecue sauce. Canned pasta sauce. Casseroles. Pizza. Lasagna. Frozen meals. Potato chips. [place] fries. Summary  You can reduce your risk of kidney stones by making changes to your diet.  The most important thing you can do is drink enough fluid. You should drink enough fluid to keep your urine clear or pale yellow.  Ask your health care provider or dietitian how much protein from animal sources you should eat each day, and also how much salt and calcium you should have each day. This information is not intended to replace advice given to you by your health care provider. Make sure you discuss any questions you have with your health care provider. Document Revised: 04/03/2019 Document Reviewed: 11/22/2016 Elsevier Patient Education  2020 [facility]  You will receive a box/kit in the mail that will have a urine jug and instructions in the kit.  When the box arrives  you will need to call our office [phone] to schedule a LAB appointment.  You will need to do a 24hour urine and this should be done during the days that our office will be open.  For example any day from Sunday through Thursday.  If you take Vitamin C 168m or greater please stop this 5 days prior to collection.  How to collect the urine sample: On the day you start the urine sample this 1st morning urine should NOT be collected.  For the rest of the day including all night urines should be collected.  On the next morning the 1st urine should be collected and then you will be finished with the urine collections.  You will need to bring the box with you on your LAB appointment day after urine has been collected and all instructions are complete in the box.  Your blood will be drawn and the box will be collected by our Lab employee to be sent  for analysis.  When urine and blood is complete you will need to schedule a follow up appointment for lab results.   

## 2020-04-07 NOTE — Progress Notes (Signed)
   04/07/2020 4:01 PM   [name] Dec 23, 1971 [id number]  Reason for visit: Follow up nephrolithiasis, review 24-hour urine results  HPI: I saw Mr. Suppes back in urology clinic for discussion of nephrolithiasis and to review his 24-hour urine results.  He is a 49 year old male with recurrent nephrolithiasis who has not tolerated ureteral stents well in the past, as well as extensive cardiac history.  He was most recently hospitalized in February 2021 with an NSTEMI and elevated troponin as well as a 7 mm right proximal ureteral stone.  He underwent ureteroscopy and laser lithotripsy without stent placement at that time.  He denies any complaints of flank pain or gross hematuria since surgery.  He has a 6 mm non-obstructing left lower pole stone that is asymptomatic.  We discussed options at length for this including observation, ureteroscopy, shockwave lithotripsy.  He has no longer on anticoagulation from a cardiac perspective, and he would like to consider shockwave lithotripsy in the future.  He also recently completed a 24-hour urine for stone prevention.  Findings were notable for low urine volume of 2 L, supersaturation of calcium oxalate elevated at 10.39, normal urine calcium at 210, elevated urine oxalate at 60, normal urine citrate of 766, urine pH 5.5, sodium elevated at 195.  We reviewed these results at length.  I recommended increasing fluid intake with goal of 2.5 L of urine output per day, minimizing high oxalate foods in the diet, intake of calcium with meals to minimize oxalate absorption, and decreasing sodium in the diet.  We also discussed consideration of potassium citrate for stone prevention, and he would like to start with diet changes first.  RTC 6 months with repeat 24-hour urine collection and KUB Consider scheduling shockwave lithotripsy pending KUB results for left lower pole stone  I spent 30 total minutes on the day of the encounter including pre-visit review of  the medical record, face-to-face time with the patient, and post visit ordering of labs/imaging/tests.  Billey Co, [facility] [address], [place], [place] [address] [phone]

## 2020-04-08 ENCOUNTER — Other Ambulatory Visit: Payer: Self-pay

## 2020-04-08 ENCOUNTER — Encounter: Payer: 59 | Admitting: *Deleted

## 2020-04-08 DIAGNOSIS — Z713 Dietary counseling and surveillance: Secondary | ICD-10-CM | POA: Diagnosis not present

## 2020-04-08 DIAGNOSIS — I214 Non-ST elevation (NSTEMI) myocardial infarction: Secondary | ICD-10-CM

## 2020-04-08 DIAGNOSIS — Z955 Presence of coronary angioplasty implant and graft: Secondary | ICD-10-CM

## 2020-04-08 LAB — GLUCOSE, CAPILLARY
Glucose-Capillary: 170 mg/dL — ABNORMAL HIGH (ref 70–99)
Glucose-Capillary: 137 mg/dL — ABNORMAL HIGH (ref 70–99)

## 2020-04-08 NOTE — Progress Notes (Signed)
Daily Session Note  Patient Details  Name: John Hartman MRN: [id number] Date of Birth: 11/04/1971 Referring Provider:     Cardiac Rehab from 03/30/2020 in [facility]  Referring Provider  Paraschos      Encounter Date: 04/08/2020  Check In: Session Check In - 04/08/20 1705      Check-In   Supervising physician immediately available to respond to emergencies  See telemetry face sheet for immediately available ER MD    Location  [facility]    Staff Present   , RN BSN;Melissa Caiola RDN, LDN;Susanne Bice, RN, BSN, CCRP    Virtual Visit  No    Medication changes reported      No    Fall or balance concerns reported     No    Warm-up and Cool-down  Performed on first and last piece of equipment    Resistance Training Performed  Yes    VAD Patient?  No    PAD/SET Patient?  No      Pain Assessment   Currently in Pain?  No/denies          Social History   Tobacco Use  Smoking Status Former Smoker  . Packs/day: 1.00  . Years: 10.00  . Pack years: 10.00  . Types: Cigarettes  . Quit date: 04/15/2003  . Years since quitting: 16.9  Smokeless Tobacco Never Used    Goals Met:  Independence with exercise equipment Exercise tolerated well No report of cardiac concerns or symptoms Strength training completed today  Goals Unmet:  Not Applicable  Comments: Pt able to follow exercise prescription today without complaint.  Will continue to monitor for progression.    Dr. Mark Miller is Medical Director for [facility] and [name]. 

## 2020-04-09 ENCOUNTER — Other Ambulatory Visit: Payer: Self-pay

## 2020-04-09 ENCOUNTER — Encounter: Payer: 59 | Admitting: *Deleted

## 2020-04-09 DIAGNOSIS — I214 Non-ST elevation (NSTEMI) myocardial infarction: Secondary | ICD-10-CM

## 2020-04-09 DIAGNOSIS — Z955 Presence of coronary angioplasty implant and graft: Secondary | ICD-10-CM

## 2020-04-09 DIAGNOSIS — Z713 Dietary counseling and surveillance: Secondary | ICD-10-CM | POA: Diagnosis not present

## 2020-04-09 LAB — GLUCOSE, CAPILLARY
Glucose-Capillary: 143 mg/dL — ABNORMAL HIGH (ref 70–99)
Glucose-Capillary: 109 mg/dL — ABNORMAL HIGH (ref 70–99)

## 2020-04-09 NOTE — Progress Notes (Signed)
Daily Session Note  Patient Details  Name: Deadrian Toya MRN: [id number] Date of Birth: 1971-03-30 Referring Provider:     Cardiac Rehab from 03/30/2020 in [facility]  Referring Provider  Paraschos      Encounter Date: 04/09/2020  Check In: Session Check In - 04/09/20 1656      Check-In   Supervising physician immediately available to respond to emergencies  See telemetry face sheet for immediately available ER MD    Location  [facility]    Staff Present  Renita Papa, RN BSN;Joseph Hood RCP,RRT,BSRT;Melissa Glenwood RDN, LDN    Virtual Visit  No    Medication changes reported      No    Fall or balance concerns reported     No    Warm-up and Cool-down  Performed on first and last piece of equipment    Resistance Training Performed  Yes    VAD Patient?  No    PAD/SET Patient?  No      Pain Assessment   Currently in Pain?  No/denies          Social History   Tobacco Use  Smoking Status Former Smoker  . Packs/day: 1.00  . Years: 10.00  . Pack years: 10.00  . Types: Cigarettes  . Quit date: 04/15/2003  . Years since quitting: 16.9  Smokeless Tobacco Never Used    Goals Met:  Independence with exercise equipment Exercise tolerated well No report of cardiac concerns or symptoms Strength training completed today  Goals Unmet:  Not Applicable  Comments: Pt able to follow exercise prescription today without complaint.  Will continue to monitor for progression.    Dr. Emily Filbert is Medical Director for [place] and [name].

## 2020-04-14 ENCOUNTER — Other Ambulatory Visit: Payer: Self-pay

## 2020-04-14 DIAGNOSIS — I214 Non-ST elevation (NSTEMI) myocardial infarction: Secondary | ICD-10-CM

## 2020-04-14 NOTE — Progress Notes (Signed)
Completed Initial RD Eval 

## 2020-04-15 ENCOUNTER — Encounter: Payer: 59 | Admitting: *Deleted

## 2020-04-15 ENCOUNTER — Other Ambulatory Visit: Payer: Self-pay

## 2020-04-15 ENCOUNTER — Encounter: Payer: Self-pay | Admitting: *Deleted

## 2020-04-15 DIAGNOSIS — Z713 Dietary counseling and surveillance: Secondary | ICD-10-CM | POA: Diagnosis not present

## 2020-04-15 DIAGNOSIS — Z955 Presence of coronary angioplasty implant and graft: Secondary | ICD-10-CM

## 2020-04-15 DIAGNOSIS — I214 Non-ST elevation (NSTEMI) myocardial infarction: Secondary | ICD-10-CM

## 2020-04-15 NOTE — Progress Notes (Signed)
Daily Session Note  Patient Details  Name: John Hartman MRN: [id number] Date of Birth: [birth year] Referring Provider:     Cardiac Rehab from 03/30/2020 in [facility]  Referring Provider  Paraschos      Encounter Date: 04/15/2020  Check In: Session Check In - 04/15/20 [place]      Check-In   Supervising physician immediately available to respond to emergencies  See telemetry face sheet for immediately available ER MD    Location  [facility]    Staff Present  Renita Papa, RN BSN;Melissa Caiola RDN, Rowe Pavy, BA, ACSM CEP, Exercise Physiologist    Virtual Visit  No    Medication changes reported      No    Fall or balance concerns reported     No    Warm-up and Cool-down  Performed on first and last piece of equipment    Resistance Training Performed  Yes    VAD Patient?  No    PAD/SET Patient?  No      Pain Assessment   Currently in Pain?  No/denies          Social History   Tobacco Use  Smoking Status Former Smoker  . Packs/day: 1.00  . Years: 10.00  . Pack years: 10.00  . Types: Cigarettes  . Quit date: 04/15/2003  . Years since quitting: 17.0  Smokeless Tobacco Never Used    Goals Met:  Independence with exercise equipment Exercise tolerated well No report of cardiac concerns or symptoms Strength training completed today  Goals Unmet:  Not Applicable  Comments: Pt able to follow exercise prescription today without complaint.  Will continue to monitor for progression.    Dr. Emily Filbert is Medical Director for [place] and [name].

## 2020-04-15 NOTE — Progress Notes (Signed)
Cardiac Individual Treatment Plan  Patient Details  Name: John Hartman MRN: [id number] Date of Birth: 15-Jun-1971 Referring Provider:     Cardiac Rehab from 03/30/2020 in [facility] Cardiac and Pulmonary Rehab  Referring Provider  Paraschos      Initial Encounter Date:    Cardiac Rehab from 03/30/2020 in [facility] Cardiac and Pulmonary Rehab  Date  03/30/20      Visit Diagnosis: NSTEMI (non-ST elevated myocardial infarction) [facility])  Status post coronary artery stent placement  Patient's Home Medications on Admission:  Current Outpatient Medications:  .  ACCU-CHEK SOFTCLIX LANCETS lancets, USE AS DIRECTED, Disp: 100 each, Rfl: 0 .  allopurinol (ZYLOPRIM) 100 MG tablet, Take 100 mg by mouth daily. , Disp: , Rfl:  .  amLODipine (NORVASC) 10 MG tablet, Take 1 tablet (10 mg total) by mouth daily., Disp: 30 tablet, Rfl: 1 .  aspirin EC 81 MG tablet, Take 81 mg by mouth daily., Disp: , Rfl:  .  atorvastatin (LIPITOR) 80 MG tablet, Take 1 tablet (80 mg total) by mouth daily at 6 PM. (Patient taking differently: Take 80 mg by mouth daily with supper. ), Disp: 30 tablet, Rfl: 0 .  BELBUCA 450 MCG FILM, Place 1 Film inside cheek 2 (two) times daily., Disp: 60 each, Rfl: 1 .  cetirizine (ZYRTEC) 10 MG tablet, Take 10 mg by mouth at bedtime., Disp: , Rfl:  .  Dapagliflozin-metFORMIN HCl ER (XIGDUO XR) 04-999 MG TB24, Take 2 tablets by mouth daily. , Disp: , Rfl:  .  diphenhydrAMINE (BENADRYL) 25 MG tablet, Take 50 mg by mouth at bedtime., Disp: , Rfl:  .  ezetimibe (ZETIA) 10 MG tablet, Take 1 tablet (10 mg total) by mouth daily., Disp: 30 tablet, Rfl: 11 .  hydrALAZINE (APRESOLINE) 25 MG tablet, Take 1 tablet (25 mg total) by mouth 2 (two) times daily., Disp: 60 tablet, Rfl: 0 .  isosorbide mononitrate (IMDUR) 30 MG 24 hr tablet, Take 30 mg by mouth daily., Disp: , Rfl:  .  Melatonin 5 MG TABS, Take 10 mg by mouth at bedtime. , Disp: , Rfl:  .  Menthol, Topical Analgesic, (BIOFREEZE EX), Apply 1 application  topically 3 (three) times daily as needed (muscle cramps/shoulder pain.)., Disp: , Rfl:  .  metoprolol tartrate (LOPRESSOR) 25 MG tablet, Take 25 mg by mouth 2 (two) times daily., Disp: , Rfl:  .  nitroGLYCERIN (NITROSTAT) 0.4 MG SL tablet, Place 1 tablet (0.4 mg total) under the tongue every 5 (five) minutes as needed for chest pain., Disp: 30 tablet, Rfl: 0 .  ONETOUCH VERIO test strip, USE 2 (TWO) TIMES DAILY USE AS INSTRUCTED., Disp: , Rfl:  .  pregabalin (LYRICA) 50 MG capsule, Take 1 capsule (50 mg total) by mouth 3 (three) times daily., Disp: 90 capsule, Rfl: 2 .  RYBELSUS 3 MG TABS, Take 3 mg by mouth daily at 6 (six) AM. , Disp: , Rfl:  .  tamsulosin (FLOMAX) 0.4 MG CAPS capsule, Take 1 capsule (0.4 mg total) by mouth daily after supper., Disp: 30 capsule, Rfl: 1 .  tiZANidine (ZANAFLEX) 4 MG tablet, Take 1 tablet (4 mg total) by mouth every 8 (eight) hours as needed for muscle spasms., Disp: 90 tablet, Rfl: 5 .  triamcinolone cream (KENALOG) 0.1 %, Apply topically 3 (three) times daily., Disp: 30 g, Rfl: 0 .  vitamin B-12 (CYANOCOBALAMIN) 1000 MCG tablet, Take 1,000 mcg by mouth 3 (three) times a week., Disp: , Rfl:   Past Medical History: Past Medical History:  Diagnosis Date  . Angioedema   . Bronchitis   . DDD (degenerative disc disease), cervical   . Diabetes mellitus ([place])   . Fatty liver 03/31/2017   [place] April 2018  . GERD (gastroesophageal reflux disease)   . Gout   . Heart attack ([place])   . HTN (hypertension)   . Hypertension    CONTROLLED ON MEDS  . IBS (irritable bowel syndrome)   . Kidney stone   . LVH (left ventricular hypertrophy) 11/24/2018   Moderate, ECHO  . Myocardial infarction ([place]) 08/23/2018  . Seasonal allergies   . Spinal stenosis     Tobacco Use: Social History   Tobacco Use  Smoking Status Former Smoker  . Packs/day: 1.00  . Years: 10.00  . Pack years: 10.00  . Types: Cigarettes  . Quit date: 04/15/2003  . Years since quitting: 17.0   Smokeless Tobacco Never Used    Labs: Recent Chemical engineer    Labs for ITP Cardiac and Pulmonary Rehab Latest Ref Rng & Units 02/14/2019 03/28/2019 10/18/2019 01/06/2020 02/08/2020   Cholestrol 0 - 200 mg/dL 110 103 - 135 145   LDLCALC 0 - 99 mg/dL 59 58 - 78 95   HDL >40 mg/dL 32(L) 25(L) - 33(L) 33(L)   Trlycerides <150 mg/dL 108 116 - 140 84   Hemoglobin A1c 4.8 - 5.6 % 6.1(H) - 7.2 - 6.4(H)       Exercise Target Goals: Exercise Program Goal: Individual exercise prescription set using results from initial 6 min walk test and THRR while considering  patient's activity barriers and safety.   Exercise Prescription Goal: Initial exercise prescription builds to 30-45 minutes a day of aerobic activity, 2-3 days per week.  Home exercise guidelines will be given to patient during program as part of exercise prescription that the participant will acknowledge.   Education: Aerobic Exercise & Resistance Training: - Gives group verbal and written instruction on the various components of exercise. Focuses on aerobic and resistive training programs and the benefits of this training and how to safely progress through these programs..   Cardiac Rehab from 10/31/2018 in [facility] Cardiac and Pulmonary Rehab  Date  09/24/18  Educator  [facility]  Instruction Review Code  1- [employer] Understanding      Education: Exercise & Equipment Safety: - Individual verbal instruction and demonstration of equipment use and safety with use of the equipment.   Cardiac Rehab from 03/30/2020 in [facility] Cardiac and Pulmonary Rehab  Date  03/30/20  Educator  AS  Instruction Review Code  1- Verbalizes Understanding      Education: Exercise Physiology & General Exercise Guidelines: - Group verbal and written instruction with models to review the exercise physiology of the cardiovascular system and associated critical values. Provides general exercise guidelines with specific guidelines to those with heart or lung disease.     Cardiac Rehab from 10/31/2018 in [facility] Cardiac and Pulmonary Rehab  Date  09/19/18  Educator  [facility]  Instruction Review Code  1- Verbalizes Understanding      Education: Flexibility, Balance, Mind/Body Relaxation: Provides group verbal/written instruction on the benefits of flexibility and balance training, including mind/body exercise modes such as yoga, pilates and tai chi.  Demonstration and skill practice provided.   Cardiac Rehab from 10/31/2018 in [facility] Cardiac and Pulmonary Rehab  Date  10/01/18  Educator  AS  Instruction Review Code  1- Verbalizes Understanding      Activity Barriers & Risk Stratification:   6 Minute Walk: 6 Minute  Walk    Row Name 03/30/20 1619         6 Minute Walk   Phase  Initial     Distance  1905 feet     Walk Time  6 minutes     # of Rest Breaks  0     MPH  3.6     METS  4.25     RPE  12     Perceived Dyspnea   1     VO2 Peak  14.9     Symptoms  No     Resting HR  93 bpm     Resting BP  114/76     Resting Oxygen Saturation   97 %     Exercise Oxygen Saturation  during 6 min walk  97 %     Max Ex. HR  129 bpm     Max Ex. BP  128/74     2 Minute Post BP  120/68        Oxygen Initial Assessment:   Oxygen Re-Evaluation:   Oxygen Discharge (Final Oxygen Re-Evaluation):   Initial Exercise Prescription: Initial Exercise Prescription - 03/30/20 1600      Date of Initial Exercise RX and Referring Provider   Date  03/30/20    Referring Provider  Paraschos      Treadmill   MPH  3.6    Grade  1    Minutes  15    METs  4.25      Elliptical   Level  1    Speed  3    Minutes  15    METs  4      REL-XR   Level  4    Speed  50    Minutes  15    METs  4      Prescription Details   Frequency (times per week)  3    Duration  Progress to 30 minutes of continuous aerobic without signs/symptoms of physical distress      Intensity   THRR 40-80% of Max Heartrate  125-157    Ratings of Perceived Exertion  11-15    Perceived  Dyspnea  0-4      Resistance Training   Training Prescription  Yes    Weight  3 lb    Reps  10-15       Perform Capillary Blood Glucose checks as needed.  Exercise Prescription Changes: Exercise Prescription Changes    Row Name 03/30/20 1600             Response to Exercise   Blood Pressure (Admit)  114/76       Blood Pressure (Exercise)  128/74       Blood Pressure (Exit)  120/68       Heart Rate (Admit)  93 bpm       Heart Rate (Exercise)  125 bpm       Heart Rate (Exit)  95 bpm       Rating of Perceived Exertion (Exercise)  12       Perceived Dyspnea (Exercise)  1       Symptoms  none          Exercise Comments:   Exercise Goals and Review: Exercise Goals    Row Name 03/30/20 1626             Exercise Goals   Increase Physical Activity  Yes       Intervention  Provide advice, education,  support and counseling about physical activity/exercise needs.;Develop an individualized exercise prescription for aerobic and resistive training based on initial evaluation findings, risk stratification, comorbidities and participant's personal goals.       Expected Outcomes  Short Term: Attend rehab on a regular basis to increase amount of physical activity.;Long Term: Add in home exercise to make exercise part of routine and to increase amount of physical activity.;Long Term: Exercising regularly at least 3-5 days a week.       Increase Strength and Stamina  Yes       Intervention  Provide advice, education, support and counseling about physical activity/exercise needs.;Develop an individualized exercise prescription for aerobic and resistive training based on initial evaluation findings, risk stratification, comorbidities and participant's personal goals.       Expected Outcomes  Short Term: Increase workloads from initial exercise prescription for resistance, speed, and METs.;Short Term: Perform resistance training exercises routinely during rehab and add in resistance training  at home;Long Term: Improve cardiorespiratory fitness, muscular endurance and strength as measured by increased METs and functional capacity (6MWT)       Able to understand and use rate of perceived exertion (RPE) scale  Yes       Intervention  Provide education and explanation on how to use RPE scale       Expected Outcomes  Short Term: Able to use RPE daily in rehab to express subjective intensity level;Long Term:  Able to use RPE to guide intensity level when exercising independently       Able to understand and use Dyspnea scale  Yes       Intervention  Provide education and explanation on how to use Dyspnea scale       Expected Outcomes  Short Term: Able to use Dyspnea scale daily in rehab to express subjective sense of shortness of breath during exertion;Long Term: Able to use Dyspnea scale to guide intensity level when exercising independently       Knowledge and understanding of Target Heart Rate Range (THRR)  Yes       Intervention  Provide education and explanation of THRR including how the numbers were predicted and where they are located for reference       Expected Outcomes  Short Term: Able to state/look up THRR;Short Term: Able to use daily as guideline for intensity in rehab;Long Term: Able to use THRR to govern intensity when exercising independently       Able to check pulse independently  Yes       Intervention  Provide education and demonstration on how to check pulse in carotid and radial arteries.;Review the importance of being able to check your own pulse for safety during independent exercise       Expected Outcomes  Short Term: Able to explain why pulse checking is important during independent exercise;Long Term: Able to check pulse independently and accurately       Understanding of Exercise Prescription  Yes       Intervention  Provide education, explanation, and written materials on patient's individual exercise prescription       Expected Outcomes  Short Term: Able to  explain program exercise prescription;Long Term: Able to explain home exercise prescription to exercise independently          Exercise Goals Re-Evaluation : Exercise Goals Re-Evaluation    Row Name 04/06/20 1721             Exercise Goal Re-Evaluation   Exercise Goals Review  Increase Physical Activity;Able  to understand and use rate of perceived exertion (RPE) scale;Knowledge and understanding of Target Heart Rate Range (THRR);Understanding of Exercise Prescription;Increase Strength and Stamina;Able to check pulse independently       Comments  Reviewed RPE scale, THR and program prescription with pt today.  Pt voiced understanding and was given a copy of goals to take home.       Expected Outcomes  Short: Use RPE daily to regulate intensity. Long: Follow program prescription in THR.          Discharge Exercise Prescription (Final Exercise Prescription Changes): Exercise Prescription Changes - 03/30/20 1600      Response to Exercise   Blood Pressure (Admit)  114/76    Blood Pressure (Exercise)  128/74    Blood Pressure (Exit)  120/68    Heart Rate (Admit)  93 bpm    Heart Rate (Exercise)  125 bpm    Heart Rate (Exit)  95 bpm    Rating of Perceived Exertion (Exercise)  12    Perceived Dyspnea (Exercise)  1    Symptoms  none       Nutrition:  Target Goals: Understanding of nutrition guidelines, daily intake of sodium <1539m, cholesterol <2081m calories 30% from fat and 7% or less from saturated fats, daily to have 5 or more servings of fruits and vegetables.  Education: Controlling Sodium/Reading Food Labels -Group verbal and written material supporting the discussion of sodium use in heart healthy nutrition. Review and explanation with models, verbal and written materials for utilization of the food label.   [facility]  Date  10/31/18  Educator  LB  Instruction Review Code  1- Verbalizes Understanding       Education: General Nutrition Guidelines/Fats and Fiber: -Group instruction provided by verbal, written material, models and posters to present the general guidelines for heart healthy nutrition. Gives an explanation and review of dietary fats and fiber.   [facility]  Date  10/29/18  Educator  LB  Instruction Review Code  1- Verbalizes Understanding      Biometrics: Pre Biometrics - 03/30/20 1627      Pre Biometrics   Height  5' 11.25" (1.81 m)    Weight  203 lb 6.4 oz (92.3 kg)    BMI (Calculated)  28.16    Single Leg Stand  30 seconds        Nutrition Therapy Plan and Nutrition Goals: Nutrition Therapy & Goals - 04/14/20 1231      Nutrition Therapy   Diet  HH, Low Na, DM    Drug/Food Interactions  Purine/Gout    Protein (specify units)  75-80g    Fiber  30 grams    Whole Grain Foods  3 servings    Saturated Fats  12 max. grams    Fruits and Vegetables  8 servings/day    Sodium  1.5 grams      Personal Nutrition Goals   Nutrition Goal  LT: maintain HH lifestyle under supervision    Comments  Predominatley whole food plant based diet with some meals from the meal service purple carrot as well as calcium supplements and fish 2x/week. Pt taking B12 supplement. Reviewed HH eating, vitamin D recommendations, variety, and protein.      Intervention Plan   Intervention  Prescribe, educate and counsel regarding individualized specific dietary modifications aiming towards targeted core components such as weight, hypertension, lipid management, diabetes, heart failure  and other comorbidities.    Expected Outcomes  Short Term Goal: Understand basic principles of dietary content, such as calories, fat, sodium, cholesterol and nutrients.;Short Term Goal: A plan has been developed with personal nutrition goals set during dietitian appointment.;Long Term Goal: Adherence to prescribed nutrition plan.       Nutrition  Assessments: Nutrition Assessments - 03/30/20 1624      MEDFICTS Scores   Pre Score  0       MEDIFICTS Score Key:          ?70 Need to make dietary changes          40-70 Heart Healthy Diet         ? 40 Therapeutic Level Cholesterol Diet  Nutrition Goals Re-Evaluation:   Nutrition Goals Discharge (Final Nutrition Goals Re-Evaluation):   Psychosocial: Target Goals: Acknowledge presence or absence of significant depression and/or stress, maximize coping skills, provide positive support system. Participant is able to verbalize types and ability to use techniques and skills needed for reducing stress and depression.   Education: Depression - Provides group verbal and written instruction on the correlation between heart/lung disease and depressed mood, treatment options, and the stigmas associated with seeking treatment.   Education: Sleep Hygiene -Provides group verbal and written instruction about how sleep can affect your health.  Define sleep hygiene, discuss sleep cycles and impact of sleep habits. Review good sleep hygiene tips.     Education: Stress and Anxiety: - Provides group verbal and written instruction about the health risks of elevated stress and causes of high stress.  Discuss the correlation between heart/lung disease and anxiety and treatment options. Review healthy ways to manage with stress and anxiety.   Cardiac Rehab from 10/31/2018 in [facility] Cardiac and Pulmonary Rehab  Date  10/10/18  Educator  Lucianne Lei, MSW  Instruction Review Code  1- Verbalizes Understanding       Initial Review & Psychosocial Screening: Initial Psych Review & Screening - 03/19/20 0944      Initial Review   Current issues with  None Identified      Family Dynamics   Good Support System?  Yes    Comments  He can look to his parents, brother and wife for support.      Barriers   Psychosocial barriers to participate in program  There are no identifiable barriers or psychosocial  needs.;The patient should benefit from training in stress management and relaxation.      Screening Interventions   Interventions  Encouraged to exercise;Program counselor consult;To provide support and resources with identified psychosocial needs;Provide feedback about the scores to participant    Expected Outcomes  Short Term goal: Utilizing psychosocial counselor, staff and physician to assist with identification of specific Stressors or current issues interfering with healing process. Setting desired goal for each stressor or current issue identified.;Long Term Goal: Stressors or current issues are controlled or eliminated.;Short Term goal: Identification and review with participant of any Quality of Life or Depression concerns found by scoring the questionnaire.;Long Term goal: The participant improves quality of Life and PHQ9 Scores as seen by post scores and/or verbalization of changes       Quality of Life Scores:  Quality of Life - 03/30/20 1623      Quality of Life   Select  Quality of Life      Quality of Life Scores   Health/Function Pre  20.13 %    Socioeconomic Pre  22.71 %    Psych/Spiritual Pre  22.14 %    Family Pre  29.5 %    GLOBAL Pre  22.46 %      Scores of 19 and below usually indicate a poorer quality of life in these areas.  A difference of  2-3 points is a clinically meaningful difference.  A difference of 2-3 points in the total score of the Quality of Life Index has been associated with significant improvement in overall quality of life, self-image, physical symptoms, and general health in studies assessing change in quality of life.  PHQ-9: Recent Review Flowsheet Data    Depression screen [place] 2/9 03/30/2020 03/04/2020 01/06/2020 12/23/2019 03/28/2019   Decreased Interest 0 0 0 0 0   Down, Depressed, Hopeless 0 0 0 0 0   PHQ - 2 Score 0 0 0 0 0   Altered sleeping 0 0 0 0 0   Tired, decreased energy 1 0 1 1 0   Change in appetite 0 0 1 1 0   Feeling bad or  failure about yourself  0 0 0 0 0   Trouble concentrating 0 0 0 0 0   Moving slowly or fidgety/restless 0 0 0 0 0   Suicidal thoughts 0 0 0 0 0   PHQ-9 Score 1 0 2 2 0   Difficult doing work/chores Somewhat difficult - Not difficult at all Not difficult at all Not difficult at all     Interpretation of Total Score  Total Score Depression Severity:  1-4 = Minimal depression, 5-9 = Mild depression, 10-14 = Moderate depression, 15-19 = Moderately severe depression, 20-27 = Severe depression   Psychosocial Evaluation and Intervention: Psychosocial Evaluation - 03/19/20 0949      Psychosocial Evaluation & Interventions   Interventions  Encouraged to exercise with the program and follow exercise prescription    Comments  [name] has done the program before and has a positive outlook on his health    Expected Outcomes  Short: Attend HeartTrack stress management education to decrease stress. Long: Maintain exercise Post HeartTrack to keep stress at a minimum.    Continue Psychosocial Services   Follow up required by staff       Psychosocial Re-Evaluation:   Psychosocial Discharge (Final Psychosocial Re-Evaluation):   Vocational Rehabilitation: Provide vocational rehab assistance to qualifying candidates.   Vocational Rehab Evaluation & Intervention:   Education: Education Goals: Education classes will be provided on a variety of topics geared toward better understanding of heart health and risk factor modification. Participant will state understanding/return demonstration of topics presented as noted by education test scores.  Learning Barriers/Preferences: Learning Barriers/Preferences - 03/19/20 0947      Learning Barriers/Preferences   Learning Barriers  None    Learning Preferences  None       General Cardiac Education Topics:  AED/CPR: - Group verbal and written instruction with the use of models to demonstrate the basic use of the AED with the basic ABC's of  resuscitation.   Cardiac Rehab from 10/31/2018 in [facility] Cardiac and Pulmonary Rehab  Date  10/24/18  Educator  CE  Instruction Review Code  1- Verbalizes Understanding      Anatomy & Physiology of the Heart: - Group verbal and written instruction and models provide basic cardiac anatomy and physiology, with the coronary electrical and arterial systems. Review of Valvular disease and Heart Failure   Cardiac Rehab from 10/31/2018 in [facility] Cardiac and Pulmonary Rehab  Date  10/08/18  Educator  MA  Instruction Review Code  1-  Verbalizes Understanding      Cardiac Procedures: - Group verbal and written instruction to review commonly prescribed medications for heart disease. Reviews the medication, class of the drug, and side effects. Includes the steps to properly store meds and maintain the prescription regimen. (beta blockers and nitrates)   Cardiac Rehab from 10/31/2018 in [facility] Cardiac and Pulmonary Rehab  Date  10/22/18  Educator  CE  Instruction Review Code  1- Verbalizes Understanding      Cardiac Medications I: - Group verbal and written instruction to review commonly prescribed medications for heart disease. Reviews the medication, class of the drug, and side effects. Includes the steps to properly store meds and maintain the prescription regimen.   Cardiac Rehab from 10/31/2018 in [facility] Cardiac and Pulmonary Rehab  Date  10/15/18  Educator  CE  Instruction Review Code  1- Verbalizes Understanding      Cardiac Medications II: -Group verbal and written instruction to review commonly prescribed medications for heart disease. Reviews the medication, class of the drug, and side effects. (all other drug classes)   Cardiac Rehab from 10/31/2018 in [facility] Cardiac and Pulmonary Rehab  Date  10/03/18  Educator  C.Enterkin, RN  Instruction Review Code  1- Verbalizes Understanding       Go Sex-Intimacy & Heart Disease, Get SMART - Goal Setting: - Group verbal and written instruction  through game format to discuss heart disease and the return to sexual intimacy. Provides group verbal and written material to discuss and apply goal setting through the application of the S.M.A.R.T. Method.   Cardiac Rehab from 10/31/2018 in [facility] Cardiac and Pulmonary Rehab  Date  10/22/18  Educator  CE  Instruction Review Code  1- Verbalizes Understanding      Other Matters of the Heart: - Provides group verbal, written materials and models to describe Stable Angina and Peripheral Artery. Includes description of the disease process and treatment options available to the cardiac patient.   Infection Prevention: - Provides verbal and written material to individual with discussion of infection control including proper hand washing and proper equipment cleaning during exercise session.   Cardiac Rehab from 03/30/2020 in [facility] Cardiac and Pulmonary Rehab  Date  03/30/20  Educator  AS  Instruction Review Code  1- Verbalizes Understanding      Falls Prevention: - Provides verbal and written material to individual with discussion of falls prevention and safety.   Cardiac Rehab from 03/30/2020 in [facility] Cardiac and Pulmonary Rehab  Date  03/30/20  Educator  AS  Instruction Review Code  1- Verbalizes Understanding      Other: -Provides group and verbal instruction on various topics (see comments)   Knowledge Questionnaire Score: Knowledge Questionnaire Score - 03/30/20 1625      Knowledge Questionnaire Score   Pre Score  25/26       Core Components/Risk Factors/Patient Goals at Admission: Personal Goals and Risk Factors at Admission - 03/30/20 1625      Core Components/Risk Factors/Patient Goals on Admission    Weight Management  Yes;Weight Maintenance    Intervention  Weight Management: Develop a combined nutrition and exercise program designed to reach desired caloric intake, while maintaining appropriate intake of nutrient and fiber, sodium and fats, and appropriate energy  expenditure required for the weight goal.;Weight Management: Provide education and appropriate resources to help participant work on and attain dietary goals.    Admit Weight  203 lb 6.4 oz (92.3 kg)    Goal Weight: Short Term  200  lb (90.7 kg)    Goal Weight: Long Term  200 lb (90.7 kg)    Expected Outcomes  Short Term: Continue to assess and modify interventions until short term weight is achieved;Long Term: Adherence to nutrition and physical activity/exercise program aimed toward attainment of established weight goal;Weight Maintenance: Understanding of the daily nutrition guidelines, which includes 25-35% calories from fat, 7% or less cal from saturated fats, less than 267m cholesterol, less than 1.5gm of sodium, & 5 or more servings of fruits and vegetables daily;Weight Loss: Understanding of general recommendations for a balanced deficit meal plan, which promotes 1-2 lb weight loss per week and includes a negative energy balance of [phone] kcal/d;Understanding recommendations for meals to include 15-35% energy as protein, 25-35% energy from fat, 35-60% energy from carbohydrates, less than 2060mof dietary cholesterol, 20-35 gm of total fiber daily;Understanding of distribution of calorie intake throughout the day with the consumption of 4-5 meals/snacks    Diabetes  Yes    Intervention  Provide education about signs/symptoms and action to take for hypo/hyperglycemia.;Provide education about proper nutrition, including hydration, and aerobic/resistive exercise prescription along with prescribed medications to achieve blood glucose in normal ranges: Fasting glucose 65-99 mg/dL    Expected Outcomes  Short Term: Participant verbalizes understanding of the signs/symptoms and immediate care of hyper/hypoglycemia, proper foot care and importance of medication, aerobic/resistive exercise and nutrition plan for blood glucose control.;Long Term: Attainment of HbA1C < 7%.    Intervention  Provide a combined  exercise and nutrition program that is supplemented with education, support and counseling about heart failure. Directed toward relieving symptoms such as shortness of breath, decreased exercise tolerance, and extremity edema.    Expected Outcomes  Improve functional capacity of life;Short term: Attendance in program 2-3 days a week with increased exercise capacity. Reported lower sodium intake. Reported increased fruit and vegetable intake. Reports medication compliance.;Long term: Adoption of self-care skills and reduction of barriers for early signs and symptoms recognition and intervention leading to self-care maintenance.;Short term: Daily weights obtained and reported for increase. Utilizing diuretic protocols set by physician.    Intervention  Provide education on lifestyle modifcations including regular physical activity/exercise, weight management, moderate sodium restriction and increased consumption of fresh fruit, vegetables, and low fat dairy, alcohol moderation, and smoking cessation.;Monitor prescription use compliance.    Intervention  Provide education and support for participant on nutrition & aerobic/resistive exercise along with prescribed medications to achieve LDL <7044mHDL >40m29m  Expected Outcomes  Short Term: Participant states understanding of desired cholesterol values and is compliant with medications prescribed. Participant is following exercise prescription and nutrition guidelines.;Long Term: Cholesterol controlled with medications as prescribed, with individualized exercise RX and with personalized nutrition plan. Value goals: LDL < 70mg21mL > 40 mg.       Education:Diabetes - Individual verbal and written instruction to review signs/symptoms of diabetes, desired ranges of glucose level fasting, after meals and with exercise. Acknowledge that pre and post exercise glucose checks will be done for 3 sessions at entry of program.   [facility]  Date  03/19/20  Educator  [facility] Code  1- Verbalizes Understanding      Education: Know Your Numbers and Risk Factors: -Group verbal and written instruction about important numbers in your health.  Discussion of what are risk factors and how they play a role in the disease process.  Review of Cholesterol, Blood Pressure, Diabetes, and BMI  and the role they play in your overall health.   Cardiac Rehab from 10/31/2018 in [facility] Cardiac and Pulmonary Rehab  Date  10/03/18  Educator  C.Enterkin, RN  Instruction Review Code  1- Counselling psychologist Goals Review:    Core Components/Risk Factors/Patient Goals at Discharge (Final Review):    ITP Comments: ITP Comments    Row Name 03/19/20 0952 04/06/20 1719 04/14/20 1243 04/15/20 0515     ITP Comments  Virtual Orientation performed. Patient informed when to come in for RD and EP orientation. Diagnosis can be found in [facility] 03/04/2020.  First full day of exercise!  Patient was oriented to gym and equipment including functions, settings, policies, and procedures.  Patient's individual exercise prescription and treatment plan were reviewed.  All starting workloads were established based on the results of the 6 minute walk test done at initial orientation visit.  The plan for exercise progression was also introduced and progression will be customized based on patient's performance and goals.  Completed Initial RD Eval  30 Day review completed. Medical Director review done, changes made as directed,and approval shown by signature of Market researcher.       Comments:

## 2020-04-16 ENCOUNTER — Encounter: Payer: 59 | Admitting: *Deleted

## 2020-04-16 ENCOUNTER — Other Ambulatory Visit: Payer: Self-pay

## 2020-04-16 DIAGNOSIS — Z955 Presence of coronary angioplasty implant and graft: Secondary | ICD-10-CM

## 2020-04-16 DIAGNOSIS — Z713 Dietary counseling and surveillance: Secondary | ICD-10-CM | POA: Diagnosis not present

## 2020-04-16 DIAGNOSIS — I214 Non-ST elevation (NSTEMI) myocardial infarction: Secondary | ICD-10-CM

## 2020-04-16 NOTE — Progress Notes (Signed)
Daily Session Note  Patient Details  Name: John Hartman MRN: [id number] Date of Birth: [birth year] Referring Provider:     Cardiac Rehab from 03/30/2020 in [facility] Cardiac and Pulmonary Rehab  Referring Provider  Paraschos      Encounter Date: 04/16/2020  Check In: Session Check In - 04/16/20 1713      Check-In   Supervising physician immediately available to respond to emergencies  See telemetry face sheet for immediately available ER MD    Location  [facility]-Cardiac & Pulmonary Rehab    Staff Present  Earlean Shawl, BS, ACSM CEP, Exercise Physiologist;Meredith Sherryll Burger, RN BSN;Joseph Hood RCP,RRT,BSRT    Virtual Visit  No    Medication changes reported      No    Fall or balance concerns reported     No    Tobacco Cessation  No Change    Warm-up and Cool-down  Performed on first and last piece of equipment    Resistance Training Performed  Yes    VAD Patient?  No    PAD/SET Patient?  No      Pain Assessment   Currently in Pain?  No/denies    Multiple Pain Sites  No          Social History   Tobacco Use  Smoking Status Former Smoker  . Packs/day: 1.00  . Years: 10.00  . Pack years: 10.00  . Types: Cigarettes  . Quit date: 04/15/2003  . Years since quitting: 17.0  Smokeless Tobacco Never Used    Goals Met:  Independence with exercise equipment Exercise tolerated well Personal goals reviewed No report of cardiac concerns or symptoms Strength training completed today  Goals Unmet:  Not Applicable  Comments: Pt able to follow exercise prescription today without complaint.  Will continue to monitor for progression.  Reviewed home exercise with pt today.  Pt plans to walk on [place] and in neighborhood for exercise.  Reviewed THR, pulse, RPE, sign and symptoms, NTG use, and when to call 911 or MD.  Also discussed weather considerations and indoor options.  Pt voiced understanding.   Dr. Emily Filbert is Medical Director for [place] and [name].

## 2020-04-20 ENCOUNTER — Encounter: Payer: 59 | Admitting: *Deleted

## 2020-04-20 ENCOUNTER — Other Ambulatory Visit: Payer: Self-pay

## 2020-04-20 ENCOUNTER — Other Ambulatory Visit: Payer: Self-pay | Admitting: Family Medicine

## 2020-04-20 DIAGNOSIS — I214 Non-ST elevation (NSTEMI) myocardial infarction: Secondary | ICD-10-CM

## 2020-04-20 DIAGNOSIS — Z955 Presence of coronary angioplasty implant and graft: Secondary | ICD-10-CM

## 2020-04-20 DIAGNOSIS — Z713 Dietary counseling and surveillance: Secondary | ICD-10-CM | POA: Diagnosis not present

## 2020-04-20 NOTE — Progress Notes (Signed)
Daily Session Note  Patient Details  Name: John Hartman MRN: [id number] Date of Birth: May 31, 1971 Referring Provider:     Cardiac Rehab from 03/30/2020 in [facility] Cardiac and Pulmonary Rehab  Referring Provider  Paraschos      Encounter Date: 04/20/2020  Check In: Session Check In - 04/20/20 1708      Check-In   Supervising physician immediately available to respond to emergencies  See telemetry face sheet for immediately available ER MD    Location  [facility]-Cardiac & Pulmonary Rehab    Staff Present  Renita Papa, RN BSN;Joseph [address] [place], [place], ACSM CEP, Exercise Physiologist    Virtual Visit  No    Medication changes reported      No    Fall or balance concerns reported     No    Warm-up and Cool-down  Performed on first and last piece of equipment    Resistance Training Performed  Yes    VAD Patient?  No    PAD/SET Patient?  No      Pain Assessment   Currently in Pain?  No/denies          Social History   Tobacco Use  Smoking Status Former Smoker  . Packs/day: 1.00  . Years: 10.00  . Pack years: 10.00  . Types: Cigarettes  . Quit date: 04/15/2003  . Years since quitting: 17.0  Smokeless Tobacco Never Used    Goals Met:  Independence with exercise equipment Exercise tolerated well No report of cardiac concerns or symptoms Strength training completed today  Goals Unmet:  Not Applicable  Comments: Pt able to follow exercise prescription today without complaint.  Will continue to monitor for progression.    Dr. Emily Filbert is Medical Director for [place] and [name].

## 2020-04-21 NOTE — Telephone Encounter (Signed)
appt scheduled for 5.18.2021 with Dr Ancil Boozer

## 2020-04-22 ENCOUNTER — Other Ambulatory Visit: Payer: Self-pay

## 2020-04-22 ENCOUNTER — Encounter: Payer: 59 | Admitting: *Deleted

## 2020-04-22 DIAGNOSIS — I214 Non-ST elevation (NSTEMI) myocardial infarction: Secondary | ICD-10-CM

## 2020-04-22 DIAGNOSIS — Z955 Presence of coronary angioplasty implant and graft: Secondary | ICD-10-CM

## 2020-04-22 DIAGNOSIS — Z713 Dietary counseling and surveillance: Secondary | ICD-10-CM | POA: Diagnosis not present

## 2020-04-22 NOTE — Progress Notes (Signed)
Daily Session Note  Patient Details  Name: John Hartman MRN: [id number] Date of Birth: April 10, 1971 Referring Provider:     Cardiac Rehab from 03/30/2020 in [facility] Cardiac and Pulmonary Rehab  Referring Provider  Paraschos      Encounter Date: 04/22/2020  Check In: Session Check In - 04/22/20 1706      Check-In   Supervising physician immediately available to respond to emergencies  See telemetry face sheet for immediately available ER MD    Location  [facility]-Cardiac & Pulmonary Rehab    Staff Present  Renita Papa, RN BSN;Melissa Caiola RDN, Rowe Pavy, BA, ACSM CEP, Exercise Physiologist    Virtual Visit  No    Medication changes reported      No    Fall or balance concerns reported     No    Warm-up and Cool-down  Performed on first and last piece of equipment    Resistance Training Performed  Yes    VAD Patient?  No    PAD/SET Patient?  No      Pain Assessment   Currently in Pain?  No/denies          Social History   Tobacco Use  Smoking Status Former Smoker  . Packs/day: 1.00  . Years: 10.00  . Pack years: 10.00  . Types: Cigarettes  . Quit date: 04/15/2003  . Years since quitting: 17.0  Smokeless Tobacco Never Used    Goals Met:  Independence with exercise equipment Exercise tolerated well No report of cardiac concerns or symptoms Strength training completed today  Goals Unmet:  Not Applicable  Comments: Pt able to follow exercise prescription today without complaint.  Will continue to monitor for progression.    Dr. Emily Filbert is Medical Director for [place] and [name].

## 2020-04-23 ENCOUNTER — Encounter: Payer: 59 | Admitting: *Deleted

## 2020-04-23 DIAGNOSIS — I214 Non-ST elevation (NSTEMI) myocardial infarction: Secondary | ICD-10-CM

## 2020-04-23 DIAGNOSIS — Z713 Dietary counseling and surveillance: Secondary | ICD-10-CM | POA: Diagnosis not present

## 2020-04-23 DIAGNOSIS — Z955 Presence of coronary angioplasty implant and graft: Secondary | ICD-10-CM

## 2020-04-23 NOTE — Progress Notes (Signed)
Daily Session Note  Patient Details  Name: John Hartman MRN: [id number] Date of Birth: 1970/12/30 Referring Provider:     Cardiac Rehab from 03/30/2020 in [facility] Cardiac and Pulmonary Rehab  Referring Provider  John Hartman      Encounter Date: 04/23/2020  Check In: Session Check In - 04/23/20 1706      Check-In   Supervising physician immediately available to respond to emergencies  See telemetry face sheet for immediately available ER MD    Location  [facility]-Cardiac & Pulmonary Rehab    Staff Present  Renita Papa, RN Moises Blood, BS, ACSM CEP, Exercise Physiologist;Joseph Tessie Fass RCP,RRT,BSRT    Virtual Visit  No    Medication changes reported      No    Fall or balance concerns reported     No    Warm-up and Cool-down  Performed on first and last piece of equipment    Resistance Training Performed  Yes    VAD Patient?  No    PAD/SET Patient?  No      Pain Assessment   Currently in Pain?  No/denies          Social History   Tobacco Use  Smoking Status Former Smoker  . Packs/day: 1.00  . Years: 10.00  . Pack years: 10.00  . Types: Cigarettes  . Quit date: 04/15/2003  . Years since quitting: 17.0  Smokeless Tobacco Never Used    Goals Met:  Independence with exercise equipment Exercise tolerated well No report of cardiac concerns or symptoms Strength training completed today  Goals Unmet:  Not Applicable  Comments: Pt able to follow exercise prescription today without complaint.  Will continue to monitor for progression.    Dr. Emily Filbert is Medical Director for [facility].

## 2020-04-27 ENCOUNTER — Encounter: Payer: No Typology Code available for payment source | Attending: Cardiology | Admitting: *Deleted

## 2020-04-27 ENCOUNTER — Other Ambulatory Visit: Payer: Self-pay

## 2020-04-27 DIAGNOSIS — Z955 Presence of coronary angioplasty implant and graft: Secondary | ICD-10-CM

## 2020-04-27 DIAGNOSIS — I252 Old myocardial infarction: Secondary | ICD-10-CM | POA: Insufficient documentation

## 2020-04-27 DIAGNOSIS — I214 Non-ST elevation (NSTEMI) myocardial infarction: Secondary | ICD-10-CM

## 2020-04-27 NOTE — Progress Notes (Signed)
Daily Session Note  Patient Details  Name: John Hartman MRN: [id number] Date of Birth: 09/03/1971 Referring Provider:     Cardiac Rehab from 03/30/2020 in [facility] Cardiac and Pulmonary Rehab  Referring Provider  Paraschos      Encounter Date: 04/27/2020  Check In: Session Check In - 04/27/20 1717      Check-In   Supervising physician immediately available to respond to emergencies  See telemetry face sheet for immediately available ER MD    Location  [facility]-Cardiac & Pulmonary Rehab    Staff Present  Renita Papa, RN Moises Blood, BS, ACSM CEP, Exercise Physiologist;Joseph Tessie Fass RCP,RRT,BSRT    Virtual Visit  No    Medication changes reported      No    Fall or balance concerns reported     No    Warm-up and Cool-down  Performed on first and last piece of equipment    Resistance Training Performed  Yes    VAD Patient?  No    PAD/SET Patient?  No      Pain Assessment   Currently in Pain?  No/denies          Social History   Tobacco Use  Smoking Status Former Smoker  . Packs/day: 1.00  . Years: 10.00  . Pack years: 10.00  . Types: Cigarettes  . Quit date: 04/15/2003  . Years since quitting: 17.0  Smokeless Tobacco Never Used    Goals Met:  Independence with exercise equipment Exercise tolerated well No report of cardiac concerns or symptoms Strength training completed today  Goals Unmet:  Not Applicable  Comments: Pt able to follow exercise prescription today without complaint.  Will continue to monitor for progression.    Dr. Emily Filbert is Medical Director for [place] and [name].

## 2020-04-29 ENCOUNTER — Encounter: Payer: No Typology Code available for payment source | Admitting: *Deleted

## 2020-04-29 ENCOUNTER — Other Ambulatory Visit: Payer: Self-pay

## 2020-04-29 DIAGNOSIS — Z955 Presence of coronary angioplasty implant and graft: Secondary | ICD-10-CM

## 2020-04-29 DIAGNOSIS — I252 Old myocardial infarction: Secondary | ICD-10-CM | POA: Diagnosis not present

## 2020-04-29 DIAGNOSIS — I214 Non-ST elevation (NSTEMI) myocardial infarction: Secondary | ICD-10-CM

## 2020-04-29 NOTE — Progress Notes (Signed)
Daily Session Note  Patient Details  Name: Moua Rasmusson MRN: [id number] Date of Birth: [birth year] Referring Provider:     Cardiac Rehab from 03/30/2020 in [facility] Cardiac and Pulmonary Rehab  Referring Provider  Paraschos      Encounter Date: 04/29/2020  Check In: Session Check In - 04/29/20 1658      Check-In   Supervising physician immediately available to respond to emergencies  See telemetry face sheet for immediately available ER MD    Location  [facility]-Cardiac & Pulmonary Rehab    Staff Present  Renita Papa, RN BSN;Melissa Caiola RDN, Rowe Pavy, BA, ACSM CEP, Exercise Physiologist    Virtual Visit  No    Medication changes reported      No    Fall or balance concerns reported     No    Warm-up and Cool-down  Performed on first and last piece of equipment    Resistance Training Performed  Yes    VAD Patient?  No          Social History   Tobacco Use  Smoking Status Former Smoker  . Packs/day: 1.00  . Years: 10.00  . Pack years: 10.00  . Types: Cigarettes  . Quit date: 04/15/2003  . Years since quitting: 17.0  Smokeless Tobacco Never Used    Goals Met:  Independence with exercise equipment Exercise tolerated well No report of cardiac concerns or symptoms Strength training completed today  Goals Unmet:  Not Applicable  Comments: Pt able to follow exercise prescription today without complaint.  Will continue to monitor for progression.    Dr. Emily Filbert is Medical Director for [place] and [name].

## 2020-04-30 ENCOUNTER — Encounter: Payer: No Typology Code available for payment source | Admitting: *Deleted

## 2020-04-30 ENCOUNTER — Ambulatory Visit
Payer: 59 | Attending: Student in an Organized Health Care Education/Training Program | Admitting: Student in an Organized Health Care Education/Training Program

## 2020-04-30 ENCOUNTER — Encounter: Payer: Self-pay | Admitting: Student in an Organized Health Care Education/Training Program

## 2020-04-30 DIAGNOSIS — M47816 Spondylosis without myelopathy or radiculopathy, lumbar region: Secondary | ICD-10-CM | POA: Diagnosis not present

## 2020-04-30 DIAGNOSIS — Z981 Arthrodesis status: Secondary | ICD-10-CM | POA: Insufficient documentation

## 2020-04-30 DIAGNOSIS — M5416 Radiculopathy, lumbar region: Secondary | ICD-10-CM | POA: Diagnosis not present

## 2020-04-30 DIAGNOSIS — I252 Old myocardial infarction: Secondary | ICD-10-CM | POA: Diagnosis not present

## 2020-04-30 DIAGNOSIS — M48062 Spinal stenosis, lumbar region with neurogenic claudication: Secondary | ICD-10-CM | POA: Diagnosis not present

## 2020-04-30 DIAGNOSIS — G894 Chronic pain syndrome: Secondary | ICD-10-CM | POA: Diagnosis not present

## 2020-04-30 DIAGNOSIS — I214 Non-ST elevation (NSTEMI) myocardial infarction: Secondary | ICD-10-CM

## 2020-04-30 MED ORDER — PREGABALIN 50 MG PO CAPS: 50.0000 mg | ORAL_CAPSULE | Freq: Three times a day (TID) | ORAL | 5 refills | Status: DC

## 2020-04-30 MED ORDER — TIZANIDINE HCL 4 MG PO TABS: 4.0000 mg | ORAL_TABLET | Freq: Three times a day (TID) | ORAL | 5 refills | Status: DC | PRN

## 2020-04-30 NOTE — Progress Notes (Signed)
Daily Session Note  Patient Details  Name: John Hartman MRN: [id number] Date of Birth: [birth year] Referring Provider:     Cardiac Rehab from 03/30/2020 in [facility] Cardiac and Pulmonary Rehab  Referring Provider  Paraschos      Encounter Date: 04/30/2020  Check In: Session Check In - 04/30/20 1659      Check-In   Supervising physician immediately available to respond to emergencies  See telemetry face sheet for immediately available ER MD    Location  [facility]-Cardiac & Pulmonary Rehab    Staff Present  Renita Papa, RN BSN;Joseph [address] [place], [place], ACSM CEP, Exercise Physiologist    Virtual Visit  No    Medication changes reported      No    Fall or balance concerns reported     No    Warm-up and Cool-down  Performed on first and last piece of equipment    Resistance Training Performed  Yes    VAD Patient?  No    PAD/SET Patient?  No      Pain Assessment   Currently in Pain?  No/denies          Social History   Tobacco Use  Smoking Status Former Smoker  . Packs/day: 1.00  . Years: 10.00  . Pack years: 10.00  . Types: Cigarettes  . Quit date: 04/15/2003  . Years since quitting: 17.0  Smokeless Tobacco Never Used    Goals Met:  Independence with exercise equipment Exercise tolerated well Personal goals reviewed No report of cardiac concerns or symptoms Strength training completed today  Goals Unmet:  Not Applicable  Comments: Pt able to follow exercise prescription today without complaint.  Will continue to monitor for progression.    Dr. Emily Filbert is Medical Director for [place] and [name].

## 2020-04-30 NOTE — Progress Notes (Signed)
Patient: John Hartman  Service Category: E/M  Provider: Gillis Santa, MD  DOB: 10/31/1971  DOS: 04/30/2020  Location: Office  MRN: [id number]  Setting: Ambulatory outpatient  Referring Provider: [name], FNP  Type: Established Patient  Specialty: Interventional Pain Management  PCP: [name], FNP  Location: Home  Delivery: TeleHealth     John Hartman Encounter - Pain Management PROVIDER NOTE: Information contained herein reflects review and annotations entered in association with encounter. Interpretation of such information and data should be left to medically-trained personnel. Information provided to patient can be located elsewhere in the medical record under "Patient Instructions". Document created using STT-dictation technology, any transcriptional errors that may result from process are unintentional.    Contact & Pharmacy Preferred: [phone] Home: [phone] (home) Mobile: [phone] (mobile) E-mail: [email or web address]  [id number] [place] [address]Phone: [phone]: [address] [address]Phone: [phone]: [phone]  Pre-screening  John Hartman offered "John Hartman" vs "John Hartman" encounter. He indicated preferring John Hartman for this encounter.   Reason COVID-19*  Social distancing based on CDC and AMA recommendations.   I contacted John Hartman 04/30/2020 via televisit.      I clearly identified myself as BGillis Santa MD. I verified that I was speaking with the correct person using two identifiers (Name: SJovanie Hartman and date of birth: John Hartman.  Consent I sought verbal advanced consent from SVick Freesfor John Hartman visit interactions. I informed Mr. CMcclintonof possible security and privacy concerns, risks, and limitations associated with providing "not-John Hartman" medical  evaluation and management services. I also informed Mr. COrtezof the availability of "John Hartman" appointments. Finally, I informed him that there would be a charge for the John Hartman visit and that he could be  personally, fully or partially, financially responsible for it. Mr. CJarmonexpressed understanding and agreed to proceed.   Historic Elements   Mr. SViktor Hartman a 49y.o. year old, male patient evaluated today after his last contact with our practice on 12/29/2019. Mr. CYepes has a past medical history of Angioedema, Bronchitis, DDD (degenerative disc disease), cervical, Diabetes mellitus ([place], Fatty liver (03/31/2017), GERD (gastroesophageal reflux disease), Gout, Heart attack ([place], HTN (hypertension), Hypertension, IBS (irritable bowel syndrome), Kidney stone, LVH (left ventricular hypertrophy) (11/24/2018), Myocardial infarction ([place] (08/23/2018), Seasonal allergies, and Spinal stenosis. He also  has a past surgical history that includes Shoulder surgery (Left, 2007); Nasal septum surgery (2004); ACDF with fusion (2007); Testicular torsion (1980s); Vasectomy; Colonoscopy with propofol (N/A, 06/26/2015); Cardiac catheterization (Left, 08/15/2016); Coronary/Graft Acute MI Revascularization (N/A, 08/23/2018); LEFT HEART CATH AND CORONARY ANGIOGRAPHY (N/A, 08/23/2018); Cystoscopy with biopsy (N/A, 11/22/2019); Cystoscopy/ureteroscopy/holmium laser/stent placement (Left, 11/22/2019); Shoulder arthroscopy w/ rotator cuff repair (10/01/2019); LEFT HEART CATH AND CORONARY ANGIOGRAPHY (N/A, 02/10/2020); Cystoscopy/ureteroscopy/holmium laser (02/10/2020); and Cystoscopy w/ retrogrades (02/10/2020). Mr. CGairhas a current medication list which includes the following prescription(s): accu-chek softclix lancets, allopurinol, amlodipine, aspirin ec, atorvastatin, belbuca, cetirizine, dapagliflozin-metformin hcl er, diphenhydramine, ezetimibe, hydralazine, isosorbide mononitrate, melatonin, menthol (topical analgesic),  metoprolol tartrate, nitroglycerin, onetouch verio, pregabalin, rybelsus, tamsulosin, tizanidine, triamcinolone cream, and vitamin b-12. He  reports that he quit smoking about 17 years ago. His smoking use included cigarettes. He has a 10.00 pack-year smoking history. He has never used smokeless tobacco. He reports that he does not drink alcohol or  use drugs. John Hartman is allergic to imdur [isosorbide nitrate]; lisinopril; other; drug [tape]; tapentadol; and ace inhibitors.   HPI  Today, he is being contacted for medication management.   Interval update: developed flank pain on right side on 02/07/2020. He developed chest pain the same day radiating to left shoulder , troponin at [facility] was 10 and kept going up to   9920, but went down prior to discharge . +NSTEMI.  Dr. Nehemiah Massed was consulted and cardiac cath was negative on the 15 th . He had a 7x5 mm stone int he proxima right ureter and mild hydronephrosis and renal edema. He had the stone removed after the cath on 02/10/2020 by Dr. Versie Starks . Admission: 02/07/2020  Discharge : 02/17/2020  Recurrent kidney stones, started since he became a vegan, instructed to drink milk when he eats leafy vegetables. Is being followed by Urology and Cardiology. Is participating in cardiac rehab 3x/week. Refill Lyrica/Tizanidine today.  Pharmacotherapy Assessment  Analgesic: Belbuca prn  Monitoring: [place] PMP: PDMP reviewed during this encounter.       Pharmacotherapy: No side-effects or adverse reactions reported. Compliance: No problems identified. Effectiveness: Clinically acceptable. Plan: Refer to "POC".  UDS:  Summary  Date Value Ref Range Status  11/13/2018 FINAL  Final    Comment:    ==================================================================== TOXASSURE SELECT 13 (MW) ==================================================================== Test                             Result       Flag       Units   NO DRUGS  DETECTED. ==================================================================== Test                      Result    Flag   Units      Ref Range   Creatinine              91               mg/dL      >=20 ==================================================================== Declared Medications:  Medication list was not provided. ==================================================================== For clinical consultation, please call [phone]. ====================================================================    Laboratory Chemistry Profile   Renal Lab Results  Component Value Date   BUN 21 (H) 02/12/2020   CREATININE 1.28 (H) 01/60/1093   BCR NOT APPLICABLE 23/55/7322   GFRAA >60 02/12/2020   GFRNONAA >60 02/12/2020     Hepatic Lab Results  Component Value Date   AST 25 01/06/2020   ALT 34 01/06/2020   ALBUMIN 4.1 11/26/2019   ALKPHOS 77 11/26/2019   LIPASE 37 03/26/2019     Electrolytes Lab Results  Component Value Date   NA 136 02/12/2020   K 4.2 02/12/2020   CL 104 02/12/2020   CALCIUM 8.8 (L) 02/12/2020   MG 2.3 02/12/2020   PHOS 1.9 (L) 08/27/2016     Bone Lab Results  Component Value Date   VD25OH 30 08/04/2017     Inflammation (CRP: Acute Phase) (ESR: Chronic Phase) No results found for: CRP, ESRSEDRATE, LATICACIDVEN     Note: Above Lab results reviewed.  Imaging  CARDIAC CATHETERIZATION  Previously placed Mid Cx drug eluting stent is widely patent.  Balloon angioplasty was performed.  Ost 1st Diag lesion is 50% stenosed.  Prox RCA lesion is 35% stenosed.  Ost LAD to Prox LAD lesion is 40% stenosed.   49 year old male with known hypertension hyperlipidemia and coronary  artery disease status post  previous myocardial infarction and stent  placement mid circumflex artery in the past who has had a significant  stress malignant hypertension and chest discomfort with minimal elevation  of troponin consistent with non-ST elevation  myocardial infarction  LV with inferior hypokinesis and ejection fraction of 40% unchanged from  before  Patent stent of left circumflex artery Moderate atherosclerosis of proximal left anterior descending artery and  diagonal artery noncritical Minimal atherosclerosis of right coronary artery  Plan No further cardiac intervention at this time with moderate atherosclerosis  stable at this time with no need for intervention High intensity cholesterol therapy Single antiplatelet therapy due to concerns patient needing intervention  of kidney stones Hypertension control for malignant hypertension due to severe stress and  pain Cardiac rehabilitation DG OR UROLOGY CYSTO IMAGE ([place]) There is no interpretation for this exam.    This order is for images obtained during a surgical procedure.  Please See  "Surgeries" Tab for more information regarding the procedure.  Assessment  The primary encounter diagnosis was Chronic pain syndrome. Diagnoses of Lumbar facet arthropathy, Spondylosis of lumbar region without myelopathy or radiculopathy, Lumbar radiculopathy, Spinal stenosis of lumbar region with neurogenic claudication, and S/P cervical spinal fusion were also pertinent to this visit.  Plan of Care   John Hartman has a current medication list which includes the following long-term medication(s): allopurinol, amlodipine, atorvastatin, ezetimibe, hydralazine, metoprolol tartrate, nitroglycerin, and pregabalin.  Pharmacotherapy (Medications Ordered): Meds ordered this encounter  Medications  . pregabalin (LYRICA) 50 MG capsule    Sig: Take 1 capsule (50 mg total) by mouth 3 (three) times daily.    Dispense:  90 capsule    Refill:  5  . tiZANidine (ZANAFLEX) 4 MG tablet    Sig: Take 1 tablet (4 mg total) by mouth every 8 (eight) hours as needed for muscle spasms.    Dispense:  90 tablet    Refill:  5   Follow-up plan:   No follow-ups on file.    Recent Visits No visits  were found meeting these conditions.  Showing recent visits within past 90 days and meeting all other requirements   Future Appointments No visits were found meeting these conditions.  Showing future appointments within next 90 days and meeting all other requirements   I discussed the assessment and treatment plan with the patient. The patient was provided an opportunity to ask questions and all were answered. The patient agreed with the plan and demonstrated an understanding of the instructions.  Patient advised to call back or seek an John Hartman evaluation if the symptoms or condition worsens.  Duration of encounter: 25 minutes.  Note by: John Santa, MD Date: 04/30/2020; Time: 2:43 PM

## 2020-05-04 ENCOUNTER — Encounter: Payer: No Typology Code available for payment source | Admitting: *Deleted

## 2020-05-04 ENCOUNTER — Other Ambulatory Visit: Payer: Self-pay

## 2020-05-04 DIAGNOSIS — I214 Non-ST elevation (NSTEMI) myocardial infarction: Secondary | ICD-10-CM

## 2020-05-04 DIAGNOSIS — I252 Old myocardial infarction: Secondary | ICD-10-CM | POA: Diagnosis not present

## 2020-05-04 NOTE — Progress Notes (Signed)
Daily Session Note  Patient Details  Name: John Hartman MRN: [id number] Date of Birth: 07-16-1971 Referring Provider:     Cardiac Rehab from 03/30/2020 in [facility] Cardiac and Pulmonary Rehab  Referring Provider  Paraschos      Encounter Date: 05/04/2020  Check In: Session Check In - 05/04/20 1705      Check-In   Supervising physician immediately available to respond to emergencies  See telemetry face sheet for immediately available ER MD    Location  [facility]-Cardiac & Pulmonary Rehab    Staff Present  Renita Papa, RN BSN;Joseph [address] [place], [place], ACSM CEP, Exercise Physiologist    Virtual Visit  No    Medication changes reported      No    Fall or balance concerns reported     No    Warm-up and Cool-down  Performed on first and last piece of equipment    Resistance Training Performed  Yes    VAD Patient?  No    PAD/SET Patient?  No      Pain Assessment   Currently in Pain?  No/denies          Social History   Tobacco Use  Smoking Status Former Smoker  . Packs/day: 1.00  . Years: 10.00  . Pack years: 10.00  . Types: Cigarettes  . Quit date: 04/15/2003  . Years since quitting: 17.0  Smokeless Tobacco Never Used    Goals Met:  Independence with exercise equipment Exercise tolerated well No report of cardiac concerns or symptoms Strength training completed today  Goals Unmet:  Not Applicable  Comments: Pt able to follow exercise prescription today without complaint.  Will continue to monitor for progression.    Dr. Emily Filbert is Medical Director for [place] and [name].

## 2020-05-06 ENCOUNTER — Encounter: Payer: No Typology Code available for payment source | Admitting: *Deleted

## 2020-05-06 ENCOUNTER — Other Ambulatory Visit: Payer: Self-pay

## 2020-05-06 DIAGNOSIS — I214 Non-ST elevation (NSTEMI) myocardial infarction: Secondary | ICD-10-CM

## 2020-05-06 DIAGNOSIS — Z955 Presence of coronary angioplasty implant and graft: Secondary | ICD-10-CM

## 2020-05-06 DIAGNOSIS — I252 Old myocardial infarction: Secondary | ICD-10-CM | POA: Diagnosis not present

## 2020-05-06 NOTE — Progress Notes (Signed)
Daily Session Note  Patient Details  Name: John Hartman MRN: [id number] Date of Birth: [birth year] Referring Provider:     Cardiac Rehab from 03/30/2020 in [facility] Cardiac and Pulmonary Rehab  Referring Provider  Paraschos      Encounter Date: 05/06/2020  Check In: Session Check In - 05/06/20 1714      Check-In   Supervising physician immediately available to respond to emergencies  See telemetry face sheet for immediately available ER MD    Staff Present  Nada Maclachlan, BA, ACSM CEP, Exercise Physiologist;Melissa Caiola RDN, LDN;Marcelus Dubberly Sherryll Burger, RN BSN    Virtual Visit  No    Medication changes reported      No    Fall or balance concerns reported     No    Warm-up and Cool-down  Performed on first and last piece of equipment    Resistance Training Performed  Yes    VAD Patient?  No    PAD/SET Patient?  No      Pain Assessment   Currently in Pain?  No/denies          Social History   Tobacco Use  Smoking Status Former Smoker  . Packs/day: 1.00  . Years: 10.00  . Pack years: 10.00  . Types: Cigarettes  . Quit date: 04/15/2003  . Years since quitting: 17.0  Smokeless Tobacco Never Used    Goals Met:  Independence with exercise equipment Exercise tolerated well No report of cardiac concerns or symptoms Strength training completed today  Goals Unmet:  Not Applicable  Comments: Pt able to follow exercise prescription today without complaint.  Will continue to monitor for progression.    Dr. Emily Filbert is Medical Director for [place] and [name].

## 2020-05-07 ENCOUNTER — Encounter: Payer: No Typology Code available for payment source | Admitting: *Deleted

## 2020-05-07 DIAGNOSIS — I214 Non-ST elevation (NSTEMI) myocardial infarction: Secondary | ICD-10-CM

## 2020-05-07 DIAGNOSIS — Z955 Presence of coronary angioplasty implant and graft: Secondary | ICD-10-CM

## 2020-05-07 DIAGNOSIS — I252 Old myocardial infarction: Secondary | ICD-10-CM | POA: Diagnosis not present

## 2020-05-07 NOTE — Progress Notes (Signed)
Daily Session Note  Patient Details  Name: John Hartman MRN: [id number] Date of Birth: July 23, 1971 Referring Provider:     Cardiac Rehab from 03/30/2020 in [facility] Cardiac and Pulmonary Rehab  Referring Provider  Paraschos      Encounter Date: 05/07/2020  Check In: Session Check In - 05/07/20 1658      Check-In   Supervising physician immediately available to respond to emergencies  See telemetry face sheet for immediately available ER MD    Location  [facility]-Cardiac & Pulmonary Rehab    Staff Present  Earlean Shawl, BS, ACSM CEP, Exercise Physiologist;Joseph Alcus Dad, RN BSN    Virtual Visit  No    Medication changes reported      No    Fall or balance concerns reported     No    Warm-up and Cool-down  Performed on first and last piece of equipment    Resistance Training Performed  Yes    VAD Patient?  No    PAD/SET Patient?  No      Pain Assessment   Currently in Pain?  No/denies          Social History   Tobacco Use  Smoking Status Former Smoker  . Packs/day: 1.00  . Years: 10.00  . Pack years: 10.00  . Types: Cigarettes  . Quit date: 04/15/2003  . Years since quitting: 17.0  Smokeless Tobacco Never Used    Goals Met:  Independence with exercise equipment Exercise tolerated well No report of cardiac concerns or symptoms Strength training completed today  Goals Unmet:  Not Applicable  Comments: Pt able to follow exercise prescription today without complaint.  Will continue to monitor for progression.    Dr. Emily Filbert is Medical Director for [place] and [name].

## 2020-05-11 ENCOUNTER — Encounter: Payer: No Typology Code available for payment source | Admitting: *Deleted

## 2020-05-11 ENCOUNTER — Other Ambulatory Visit: Payer: Self-pay

## 2020-05-11 DIAGNOSIS — I252 Old myocardial infarction: Secondary | ICD-10-CM | POA: Diagnosis not present

## 2020-05-11 DIAGNOSIS — I214 Non-ST elevation (NSTEMI) myocardial infarction: Secondary | ICD-10-CM

## 2020-05-11 DIAGNOSIS — Z955 Presence of coronary angioplasty implant and graft: Secondary | ICD-10-CM

## 2020-05-11 NOTE — Progress Notes (Signed)
Daily Session Note  Patient Details  Name: Jaedon Siler MRN: [id number] Date of Birth: Aug 02, 1971 Referring Provider:     Cardiac Rehab from 03/30/2020 in [facility] Cardiac and Pulmonary Rehab  Referring Provider  Paraschos      Encounter Date: 05/11/2020  Check In: Session Check In - 05/11/20 1709      Check-In   Supervising physician immediately available to respond to emergencies  See telemetry face sheet for immediately available ER MD    Location  [facility]-Cardiac & Pulmonary Rehab    Staff Present  Renita Papa, RN BSN;Joseph [address] [place], [place], ACSM CEP, Exercise Physiologist    Virtual Visit  No    Medication changes reported      No    Fall or balance concerns reported     No    Warm-up and Cool-down  Performed on first and last piece of equipment    Resistance Training Performed  Yes    VAD Patient?  No    PAD/SET Patient?  No      Pain Assessment   Currently in Pain?  No/denies          Social History   Tobacco Use  Smoking Status Former Smoker  . Packs/day: 1.00  . Years: 10.00  . Pack years: 10.00  . Types: Cigarettes  . Quit date: 04/15/2003  . Years since quitting: 17.0  Smokeless Tobacco Never Used    Goals Met:  Independence with exercise equipment Exercise tolerated well No report of cardiac concerns or symptoms Strength training completed today  Goals Unmet:  Not Applicable  Comments: Pt able to follow exercise prescription today without complaint.  Will continue to monitor for progression.    Dr. Emily Filbert is Medical Director for [place] and [name].

## 2020-05-12 ENCOUNTER — Ambulatory Visit: Payer: 59 | Admitting: Family Medicine

## 2020-05-13 ENCOUNTER — Encounter: Payer: Self-pay | Admitting: *Deleted

## 2020-05-13 DIAGNOSIS — Z955 Presence of coronary angioplasty implant and graft: Secondary | ICD-10-CM

## 2020-05-13 DIAGNOSIS — I214 Non-ST elevation (NSTEMI) myocardial infarction: Secondary | ICD-10-CM

## 2020-05-13 NOTE — Progress Notes (Signed)
Cardiac Individual Treatment Plan  Patient Details  Name: John Hartman MRN: [id number] Date of Birth: 01/29/1971 Referring Provider:     Cardiac Rehab from 03/30/2020 in [facility] Cardiac and Pulmonary Rehab  Referring Provider  Paraschos      Initial Encounter Date:    Cardiac Rehab from 03/30/2020 in [facility] Cardiac and Pulmonary Rehab  Date  03/30/20      Visit Diagnosis: NSTEMI (non-ST elevated myocardial infarction) [facility])  Status post coronary artery stent placement  Patient's Home Medications on Admission:  Current Outpatient Medications:  .  ACCU-CHEK SOFTCLIX LANCETS lancets, USE AS DIRECTED, Disp: 100 each, Rfl: 0 .  allopurinol (ZYLOPRIM) 100 MG tablet, Take 100 mg by mouth daily. , Disp: , Rfl:  .  amLODipine (NORVASC) 10 MG tablet, Take 1 tablet (10 mg total) by mouth daily., Disp: 30 tablet, Rfl: 1 .  aspirin EC 81 MG tablet, Take 81 mg by mouth daily., Disp: , Rfl:  .  atorvastatin (LIPITOR) 80 MG tablet, Take 1 tablet (80 mg total) by mouth daily at 6 PM. (Patient taking differently: Take 80 mg by mouth daily with supper. ), Disp: 30 tablet, Rfl: 0 .  BELBUCA 450 MCG FILM, Place 1 Film inside cheek 2 (two) times daily., Disp: 60 each, Rfl: 1 .  cetirizine (ZYRTEC) 10 MG tablet, Take 10 mg by mouth at bedtime., Disp: , Rfl:  .  Dapagliflozin-metFORMIN HCl ER (XIGDUO XR) 04-999 MG TB24, Take 2 tablets by mouth daily. , Disp: , Rfl:  .  diphenhydrAMINE (BENADRYL) 25 MG tablet, Take 50 mg by mouth at bedtime., Disp: , Rfl:  .  ezetimibe (ZETIA) 10 MG tablet, Take 1 tablet (10 mg total) by mouth daily., Disp: 30 tablet, Rfl: 11 .  hydrALAZINE (APRESOLINE) 25 MG tablet, Take 1 tablet (25 mg total) by mouth 2 (two) times daily., Disp: 60 tablet, Rfl: 0 .  isosorbide mononitrate (IMDUR) 30 MG 24 hr tablet, Take 30 mg by mouth daily., Disp: , Rfl:  .  Melatonin 5 MG TABS, Take 10 mg by mouth at bedtime. , Disp: , Rfl:  .  Menthol, Topical Analgesic, (BIOFREEZE EX), Apply 1 application  topically 3 (three) times daily as needed (muscle cramps/shoulder pain.)., Disp: , Rfl:  .  metoprolol tartrate (LOPRESSOR) 25 MG tablet, Take 25 mg by mouth 2 (two) times daily., Disp: , Rfl:  .  nitroGLYCERIN (NITROSTAT) 0.4 MG SL tablet, Place 1 tablet (0.4 mg total) under the tongue every 5 (five) minutes as needed for chest pain., Disp: 30 tablet, Rfl: 0 .  ONETOUCH VERIO test strip, USE 2 (TWO) TIMES DAILY USE AS INSTRUCTED., Disp: , Rfl:  .  pregabalin (LYRICA) 50 MG capsule, Take 1 capsule (50 mg total) by mouth 3 (three) times daily., Disp: 90 capsule, Rfl: 5 .  RYBELSUS 3 MG TABS, Take 3 mg by mouth daily at 6 (six) AM. , Disp: , Rfl:  .  tamsulosin (FLOMAX) 0.4 MG CAPS capsule, Take 1 capsule (0.4 mg total) by mouth daily after supper., Disp: 30 capsule, Rfl: 1 .  tiZANidine (ZANAFLEX) 4 MG tablet, Take 1 tablet (4 mg total) by mouth every 8 (eight) hours as needed for muscle spasms., Disp: 90 tablet, Rfl: 5 .  triamcinolone cream (KENALOG) 0.1 %, Apply topically 3 (three) times daily., Disp: 30 g, Rfl: 0 .  vitamin B-12 (CYANOCOBALAMIN) 1000 MCG tablet, Take 1,000 mcg by mouth 3 (three) times a week., Disp: , Rfl:   Past Medical History: Past Medical History:  Diagnosis Date  . Angioedema   . Bronchitis   . DDD (degenerative disc disease), cervical   . Diabetes mellitus ([place])   . Fatty liver 03/31/2017   [place] April 2018  . GERD (gastroesophageal reflux disease)   . Gout   . Heart attack ([place])   . HTN (hypertension)   . Hypertension    CONTROLLED ON MEDS  . IBS (irritable bowel syndrome)   . Kidney stone   . LVH (left ventricular hypertrophy) 11/24/2018   Moderate, ECHO  . Myocardial infarction ([place]) 08/23/2018  . Seasonal allergies   . Spinal stenosis     Tobacco Use: Social History   Tobacco Use  Smoking Status Former Smoker  . Packs/day: 1.00  . Years: 10.00  . Pack years: 10.00  . Types: Cigarettes  . Quit date: 04/15/2003  . Years since quitting: 17.0   Smokeless Tobacco Never Used    Labs: Recent Chemical engineer    Labs for ITP Cardiac and Pulmonary Rehab Latest Ref Rng & Units 02/14/2019 03/28/2019 10/18/2019 01/06/2020 02/08/2020   Cholestrol 0 - 200 mg/dL 110 103 - 135 145   LDLCALC 0 - 99 mg/dL 59 58 - 78 95   HDL >40 mg/dL 32(L) 25(L) - 33(L) 33(L)   Trlycerides <150 mg/dL 108 116 - 140 84   Hemoglobin A1c 4.8 - 5.6 % 6.1(H) - 7.2 - 6.4(H)       Exercise Target Goals: Exercise Program Goal: Individual exercise prescription set using results from initial 6 min walk test and THRR while considering  patient's activity barriers and safety.   Exercise Prescription Goal: Initial exercise prescription builds to 30-45 minutes a day of aerobic activity, 2-3 days per week.  Home exercise guidelines will be given to patient during program as part of exercise prescription that the participant will acknowledge.   Education: Aerobic Exercise & Resistance Training: - Gives group verbal and written instruction on the various components of exercise. Focuses on aerobic and resistive training programs and the benefits of this training and how to safely progress through these programs..   Cardiac Rehab from 10/31/2018 in [facility] Cardiac and Pulmonary Rehab  Date  09/24/18  Educator  [facility]  Instruction Review Code  1- [employer] Understanding      Education: Exercise & Equipment Safety: - Individual verbal instruction and demonstration of equipment use and safety with use of the equipment.   Cardiac Rehab from 03/30/2020 in [facility] Cardiac and Pulmonary Rehab  Date  03/30/20  Educator  AS  Instruction Review Code  1- Verbalizes Understanding      Education: Exercise Physiology & General Exercise Guidelines: - Group verbal and written instruction with models to review the exercise physiology of the cardiovascular system and associated critical values. Provides general exercise guidelines with specific guidelines to those with heart or lung disease.     Cardiac Rehab from 10/31/2018 in [facility] Cardiac and Pulmonary Rehab  Date  09/19/18  Educator  [facility]  Instruction Review Code  1- Verbalizes Understanding      Education: Flexibility, Balance, Mind/Body Relaxation: Provides group verbal/written instruction on the benefits of flexibility and balance training, including mind/body exercise modes such as yoga, pilates and tai chi.  Demonstration and skill practice provided.   Cardiac Rehab from 10/31/2018 in [facility] Cardiac and Pulmonary Rehab  Date  10/01/18  Educator  AS  Instruction Review Code  1- Verbalizes Understanding      Activity Barriers & Risk Stratification:   6 Minute Walk: 6 Minute  Walk    Row Name 03/30/20 1619         6 Minute Walk   Phase  Initial     Distance  1905 feet     Walk Time  6 minutes     # of Rest Breaks  0     MPH  3.6     METS  4.25     RPE  12     Perceived Dyspnea   1     VO2 Peak  14.9     Symptoms  No     Resting HR  93 bpm     Resting BP  114/76     Resting Oxygen Saturation   97 %     Exercise Oxygen Saturation  during 6 min walk  97 %     Max Ex. HR  129 bpm     Max Ex. BP  128/74     2 Minute Post BP  120/68        Oxygen Initial Assessment:   Oxygen Re-Evaluation:   Oxygen Discharge (Final Oxygen Re-Evaluation):   Initial Exercise Prescription: Initial Exercise Prescription - 03/30/20 1600      Date of Initial Exercise RX and Referring Provider   Date  03/30/20    Referring Provider  Paraschos      Treadmill   MPH  3.6    Grade  1    Minutes  15    METs  4.25      Elliptical   Level  1    Speed  3    Minutes  15    METs  4      REL-XR   Level  4    Speed  50    Minutes  15    METs  4      Prescription Details   Frequency (times per week)  3    Duration  Progress to 30 minutes of continuous aerobic without signs/symptoms of physical distress      Intensity   THRR 40-80% of Max Heartrate  125-157    Ratings of Perceived Exertion  11-15    Perceived  Dyspnea  0-4      Resistance Training   Training Prescription  Yes    Weight  3 lb    Reps  10-15       Perform Capillary Blood Glucose checks as needed.  Exercise Prescription Changes: Exercise Prescription Changes    Row Name 03/30/20 1600 04/15/20 1100 04/16/20 1700 04/28/20 1400 05/11/20 1400     Response to Exercise   Blood Pressure (Admit)  114/76  122/78  --  100/60  130/80   Blood Pressure (Exercise)  128/74  162/80  --  160/80  168/86   Blood Pressure (Exit)  120/68  118/68  --  108/64  128/72   Heart Rate (Admit)  93 bpm  81 bpm  --  95 bpm  95 bpm   Heart Rate (Exercise)  125 bpm  165 bpm  --  156 bpm  168 bpm   Heart Rate (Exit)  95 bpm  126 bpm  --  93 bpm  136 bpm   Rating of Perceived Exertion (Exercise)  12  14  --  15  15   Perceived Dyspnea (Exercise)  1  --  --  --  --   Symptoms  none  none  --  none  back pain elliptical   Duration  --  Continue  with 30 min of aerobic exercise without signs/symptoms of physical distress.  Continue with 30 min of aerobic exercise without signs/symptoms of physical distress.  Continue with 30 min of aerobic exercise without signs/symptoms of physical distress.  Continue with 30 min of aerobic exercise without signs/symptoms of physical distress.   Intensity  --  THRR unchanged  THRR unchanged  THRR unchanged  THRR unchanged     Progression   Progression  --  Continue to progress workloads to maintain intensity without signs/symptoms of physical distress.  Continue to progress workloads to maintain intensity without signs/symptoms of physical distress.  Continue to progress workloads to maintain intensity without signs/symptoms of physical distress.  Continue to progress workloads to maintain intensity without signs/symptoms of physical distress.   Average METs  --  4.57  4.57  6.5  3.75     Resistance Training   Training Prescription  --  Yes  Yes  Yes  Yes   Weight  --  5 lb  5 lb  7 lb  7 lb   Reps  --  10-15  10-15  10-15   10-15     Interval Training   Interval Training  --  No  No  No  No     Treadmill   MPH  --  3.6  3.6  3.6  3.6   Grade  --  '1  1  1  ' 1.5   Minutes  --  '15  15  15  15   ' METs  --  4.25  4.25  4.25  4.5     Recumbant Elliptical   Level  --  --  --  --  2   Minutes  --  --  --  --  15   METs  --  --  --  --  3     Elliptical   Level  --  1  1  --  1   Speed  --  3.7  3.7  --  3   Minutes  --  15  15  --  15     REL-XR   Level  --  '6  6  6  ' --   Speed  --  --  --  50  --   Minutes  --  '15  15  15  ' --   METs  --  4.9  4.9  8.8  --     Home Exercise Plan   Plans to continue exercise at  --  --  Home (comment) walking in neighborhood and TM at home on off days of rehab  --  Home (comment) walking in neighborhood and TM at home on off days of rehab   Frequency  --  --  Add 2 additional days to program exercise sessions.  --  Add 2 additional days to program exercise sessions.   Initial Home Exercises Provided  --  --  04/16/20  --  04/16/20      Exercise Comments:   Exercise Goals and Review: Exercise Goals    Row Name 03/30/20 1626             Exercise Goals   Increase Physical Activity  Yes       Intervention  Provide advice, education, support and counseling about physical activity/exercise needs.;Develop an individualized exercise prescription for aerobic and resistive training based on initial evaluation findings, risk stratification, comorbidities and participant's personal goals.       Expected Outcomes  Short Term: Attend rehab on a regular basis to increase amount of physical activity.;Long Term: Add in home exercise to make exercise part of routine and to increase amount of physical activity.;Long Term: Exercising regularly at least 3-5 days a week.       Increase Strength and Stamina  Yes       Intervention  Provide advice, education, support and counseling about physical activity/exercise needs.;Develop an individualized exercise prescription for aerobic and  resistive training based on initial evaluation findings, risk stratification, comorbidities and participant's personal goals.       Expected Outcomes  Short Term: Increase workloads from initial exercise prescription for resistance, speed, and METs.;Short Term: Perform resistance training exercises routinely during rehab and add in resistance training at home;Long Term: Improve cardiorespiratory fitness, muscular endurance and strength as measured by increased METs and functional capacity (6MWT)       Able to understand and use rate of perceived exertion (RPE) scale  Yes       Intervention  Provide education and explanation on how to use RPE scale       Expected Outcomes  Short Term: Able to use RPE daily in rehab to express subjective intensity level;Long Term:  Able to use RPE to guide intensity level when exercising independently       Able to understand and use Dyspnea scale  Yes       Intervention  Provide education and explanation on how to use Dyspnea scale       Expected Outcomes  Short Term: Able to use Dyspnea scale daily in rehab to express subjective sense of shortness of breath during exertion;Long Term: Able to use Dyspnea scale to guide intensity level when exercising independently       Knowledge and understanding of Target Heart Rate Range (THRR)  Yes       Intervention  Provide education and explanation of THRR including how the numbers were predicted and where they are located for reference       Expected Outcomes  Short Term: Able to state/look up THRR;Short Term: Able to use daily as guideline for intensity in rehab;Long Term: Able to use THRR to govern intensity when exercising independently       Able to check pulse independently  Yes       Intervention  Provide education and demonstration on how to check pulse in carotid and radial arteries.;Review the importance of being able to check your own pulse for safety during independent exercise       Expected Outcomes  Short Term: Able  to explain why pulse checking is important during independent exercise;Long Term: Able to check pulse independently and accurately       Understanding of Exercise Prescription  Yes       Intervention  Provide education, explanation, and written materials on patient's individual exercise prescription       Expected Outcomes  Short Term: Able to explain program exercise prescription;Long Term: Able to explain home exercise prescription to exercise independently          Exercise Goals Re-Evaluation : Exercise Goals Re-Evaluation    Row Name 04/06/20 1721 04/15/20 1108 04/16/20 1715 04/28/20 1444 04/30/20 1711     Exercise Goal Re-Evaluation   Exercise Goals Review  Increase Physical Activity;Able to understand and use rate of perceived exertion (RPE) scale;Knowledge and understanding of Target Heart Rate Range (THRR);Understanding of Exercise Prescription;Increase Strength and Stamina;Able to check pulse independently  Increase Physical Activity;Increase Strength and Stamina;Understanding of Exercise  Prescription  Increase Physical Activity;Increase Strength and Stamina;Understanding of Exercise Prescription;Able to understand and use rate of perceived exertion (RPE) scale;Knowledge and understanding of Target Heart Rate Range (THRR);Able to check pulse independently  Increase Physical Activity;Increase Strength and Stamina;Able to understand and use rate of perceived exertion (RPE) scale;Able to understand and use Dyspnea scale;Knowledge and understanding of Target Heart Rate Range (THRR);Able to check pulse independently;Understanding of Exercise Prescription  Increase Physical Activity;Increase Strength and Stamina;Able to understand and use rate of perceived exertion (RPE) scale;Able to understand and use Dyspnea scale;Knowledge and understanding of Target Heart Rate Range (THRR);Able to check pulse independently;Understanding of Exercise Prescription   Comments  Reviewed RPE scale, THR and program  prescription with pt today.  Pt voiced understanding and was given a copy of goals to take home.  John Hartman is off to a good start in rehab.  He is already doing well on the elliptical and up to level 6 on the XR.  We will continue to monitor his progress.  Reviewed home exercise with pt today.  Pt plans to walk on TM and in neighborhood for exercise.  Reviewed THR, pulse, RPE, sign and symptoms, NTG use, and when to call 911 or MD.  Also discussed weather considerations and indoor options.  Pt voiced understanding.  John Hartman is progressing well.  He works in correct HR and RPE ranges.  He has moved up to 7 lb for strength work.  John Hartman has shown increases in workloads during rehab sessions. He has increased his elevation to 1.5% on the TM. The recumbant elliptical is tolerated better then the upright ellipticial due to back pain. He continues to make progress and increase in strength and stamina. John Hartman also reports walking regularly at home.   Expected Outcomes  Short: Use RPE daily to regulate intensity. Long: Follow program prescription in THR.  Short: Continue to improve workloads  Long: Continue to follow program prescription  Short: Add 1-2 days of exercise at home on off days of cardiac rehab. Long: Become independent with exercise routine.  Short:  continue to follow program prescription Long: increase overall MET level  Short: Increase elevation on TM to 2% Long: Continue with independent exercise routine and increase strength and stamina.   [place] Name 05/11/20 1447             Exercise Goal Re-Evaluation   Exercise Goals Review  Increase Physical Activity;Increase Strength and Stamina;Understanding of Exercise Prescription       Comments  John Hartman continues to do well in rehab.  He is enjoying being back in class again.  We have rotated him off the upright elliptical with his back.  We will continue to encourage him to increase elevation on treadmill and workload on recumbent elliptical.  We will continue to  monitor his progress.       Expected Outcomes  Short: Increase treadmill elevation  Long: Continue to improve stamina.          Discharge Exercise Prescription (Final Exercise Prescription Changes): Exercise Prescription Changes - 05/11/20 1400      Response to Exercise   Blood Pressure (Admit)  130/80    Blood Pressure (Exercise)  168/86    Blood Pressure (Exit)  128/72    Heart Rate (Admit)  95 bpm    Heart Rate (Exercise)  168 bpm    Heart Rate (Exit)  136 bpm    Rating of Perceived Exertion (Exercise)  15    Symptoms  back pain elliptical  Duration  Continue with 30 min of aerobic exercise without signs/symptoms of physical distress.    Intensity  THRR unchanged      Progression   Progression  Continue to progress workloads to maintain intensity without signs/symptoms of physical distress.    Average METs  3.75      Resistance Training   Training Prescription  Yes    Weight  7 lb    Reps  10-15      Interval Training   Interval Training  No      Treadmill   MPH  3.6    Grade  1.5    Minutes  15    METs  4.5      Recumbant Elliptical   Level  2    Minutes  15    METs  3      Elliptical   Level  1    Speed  3    Minutes  15      Home Exercise Plan   Plans to continue exercise at  Home (comment)   walking in neighborhood and TM at home on off days of rehab   Frequency  Add 2 additional days to program exercise sessions.    Initial Home Exercises Provided  04/16/20       Nutrition:  Target Goals: Understanding of nutrition guidelines, daily intake of sodium <1562m, cholesterol <2058m calories 30% from fat and 7% or less from saturated fats, daily to have 5 or more servings of fruits and vegetables.  Education: Controlling Sodium/Reading Food Labels -Group verbal and written material supporting the discussion of sodium use in heart healthy nutrition. Review and explanation with models, verbal and written materials for utilization of the food label.    [facility]  Date  10/31/18  Educator  LB  Instruction Review Code  1- Verbalizes Understanding      Education: General Nutrition Guidelines/Fats and Fiber: -Group instruction provided by verbal, written material, models and posters to present the general guidelines for heart healthy nutrition. Gives an explanation and review of dietary fats and fiber.   [facility]  Date  10/29/18  Educator  LB  Instruction Review Code  1- Verbalizes Understanding      Biometrics: Pre Biometrics - 03/30/20 1627      Pre Biometrics   Height  5' 11.25" (1.81 m)    Weight  203 lb 6.4 oz (92.3 kg)    BMI (Calculated)  28.16    Single Leg Stand  30 seconds        Nutrition Therapy Plan and Nutrition Goals: Nutrition Therapy & Goals - 04/14/20 1231      Nutrition Therapy   Diet  HH, Low Na, DM    Drug/Food Interactions  Purine/Gout    Protein (specify units)  75-80g    Fiber  30 grams    Whole Grain Foods  3 servings    Saturated Fats  12 max. grams    Fruits and Vegetables  8 servings/day    Sodium  1.5 grams      Personal Nutrition Goals   Nutrition Goal  LT: maintain HH lifestyle under supervision    Comments  Predominatley whole food plant based diet with some meals from the meal service purple carrot as well as calcium supplements and fish 2x/week. Pt taking B12 supplement. Reviewed HH eating, vitamin D recommendations, variety, and protein.  Intervention Plan   Intervention  Prescribe, educate and counsel regarding individualized specific dietary modifications aiming towards targeted core components such as weight, hypertension, lipid management, diabetes, heart failure and other comorbidities.    Expected Outcomes  Short Term Goal: Understand basic principles of dietary content, such as calories, fat, sodium, cholesterol and nutrients.;Short Term Goal: A plan has been  developed with personal nutrition goals set during dietitian appointment.;Long Term Goal: Adherence to prescribed nutrition plan.       Nutrition Assessments: Nutrition Assessments - 03/30/20 1624      MEDFICTS Scores   Pre Score  0       MEDIFICTS Score Key:          ?70 Need to make dietary changes          40-70 Heart Healthy Diet         ? 40 Therapeutic Level Cholesterol Diet  Nutrition Goals Re-Evaluation: Nutrition Goals Re-Evaluation    [place] Name 04/20/20 1700             Goals   Nutrition Goal  LT: maintain [place] lifestyle under supervision       Comment  Pt would like to continue with current heart healthy changes and mostly vegan diet, no questions at this time.       Expected Outcome  LT: maintain [place] lifestyle under supervision          Nutrition Goals Discharge (Final Nutrition Goals Re-Evaluation): Nutrition Goals Re-Evaluation - 04/20/20 1700      Goals   Nutrition Goal  LT: maintain [place] lifestyle under supervision    Comment  Pt would like to continue with current heart healthy changes and mostly vegan diet, no questions at this time.    Expected Outcome  LT: maintain HH lifestyle under supervision       Psychosocial: Target Goals: Acknowledge presence or absence of significant depression and/or stress, maximize coping skills, provide positive support system. Participant is able to verbalize types and ability to use techniques and skills needed for reducing stress and depression.   Education: Depression - Provides group verbal and written instruction on the correlation between heart/lung disease and depressed mood, treatment options, and the stigmas associated with seeking treatment.   Education: Sleep Hygiene -Provides group verbal and written instruction about how sleep can affect your health.  Define sleep hygiene, discuss sleep cycles and impact of sleep habits. Review good sleep hygiene tips.     Education: Stress and Anxiety: - Provides group  verbal and written instruction about the health risks of elevated stress and causes of high stress.  Discuss the correlation between heart/lung disease and anxiety and treatment options. Review healthy ways to manage with stress and anxiety.   Cardiac Rehab from 10/31/2018 in [facility] Cardiac and Pulmonary Rehab  Date  10/10/18  Educator  Lucianne Lei, MSW  Instruction Review Code  1- Verbalizes Understanding       Initial Review & Psychosocial Screening: Initial Psych Review & Screening - 03/19/20 0944      Initial Review   Current issues with  None Identified      Family Dynamics   Good Support System?  Yes    Comments  He can look to his parents, brother and wife for support.      Barriers   Psychosocial barriers to participate in program  There are no identifiable barriers or psychosocial needs.;The patient should benefit from training in stress management and relaxation.      Screening  Interventions   Interventions  Encouraged to exercise;Program counselor consult;To provide support and resources with identified psychosocial needs;Provide feedback about the scores to participant    Expected Outcomes  Short Term goal: Utilizing psychosocial counselor, staff and physician to assist with identification of specific Stressors or current issues interfering with healing process. Setting desired goal for each stressor or current issue identified.;Long Term Goal: Stressors or current issues are controlled or eliminated.;Short Term goal: Identification and review with participant of any Quality of Life or Depression concerns found by scoring the questionnaire.;Long Term goal: The participant improves quality of Life and PHQ9 Scores as seen by post scores and/or verbalization of changes       Quality of Life Scores:  Quality of Life - 03/30/20 1623      Quality of Life   Select  Quality of Life      Quality of Life Scores   Health/Function Pre  20.13 %    Socioeconomic Pre  22.71 %     Psych/Spiritual Pre  22.14 %    Family Pre  29.5 %    GLOBAL Pre  22.46 %      Scores of 19 and below usually indicate a poorer quality of life in these areas.  A difference of  2-3 points is a clinically meaningful difference.  A difference of 2-3 points in the total score of the Quality of Life Index has been associated with significant improvement in overall quality of life, self-image, physical symptoms, and general health in studies assessing change in quality of life.  PHQ-9: Recent Review Flowsheet Data    Depression screen [facility] 2/9 03/30/2020 03/04/2020 01/06/2020 12/23/2019 03/28/2019   Decreased Interest 0 0 0 0 0   Down, Depressed, Hopeless 0 0 0 0 0   PHQ - 2 Score 0 0 0 0 0   Altered sleeping 0 0 0 0 0   Tired, decreased energy 1 0 1 1 0   Change in appetite 0 0 1 1 0   Feeling bad or failure about yourself  0 0 0 0 0   Trouble concentrating 0 0 0 0 0   Moving slowly or fidgety/restless 0 0 0 0 0   Suicidal thoughts 0 0 0 0 0   PHQ-9 Score 1 0 2 2 0   Difficult doing work/chores Somewhat difficult - Not difficult at all Not difficult at all Not difficult at all     Interpretation of Total Score  Total Score Depression Severity:  1-4 = Minimal depression, 5-9 = Mild depression, 10-14 = Moderate depression, 15-19 = Moderately severe depression, 20-27 = Severe depression   Psychosocial Evaluation and Intervention: Psychosocial Evaluation - 03/19/20 0949      Psychosocial Evaluation & Interventions   Interventions  Encouraged to exercise with the program and follow exercise prescription    Comments  John Hartman has done the program before and has a positive outlook on his health    Expected Outcomes  Short: Attend [facility] stress management education to decrease stress. Long: Maintain exercise Post Heart[facility] to keep stress at a minimum.    Continue Psychosocial Services   Follow up required by staff       Psychosocial Re-Evaluation: Psychosocial Re-Evaluation    [place] Name  04/30/20 1727             Psychosocial Re-Evaluation   Current issues with  Current Stress Concerns       Comments  John Hartman reports  that he is settled in  his new house and is sleeping well. No new stress concerns reported. He did recently do a sleep study which showed that he does have sleep apnea and he is going to be getting a C-pap machine soon to help with this. He does say he feels well rested in the morning and has energy during the day.       Expected Outcomes  Short: Get and start using C-pap machine to help with sleep apnea. Long: Maintain good mental health habits.       Interventions  Encouraged to attend Cardiac Rehabilitation for the exercise       Continue Psychosocial Services   Follow up required by staff          Psychosocial Discharge (Final Psychosocial Re-Evaluation): Psychosocial Re-Evaluation - 04/30/20 1727      Psychosocial Re-Evaluation   Current issues with  Current Stress Concerns    Comments  John Hartman reports  that he is settled in his new house and is sleeping well. No new stress concerns reported. He did recently do a sleep study which showed that he does have sleep apnea and he is going to be getting a C-pap machine soon to help with this. He does say he feels well rested in the morning and has energy during the day.    Expected Outcomes  Short: Get and start using C-pap machine to help with sleep apnea. Long: Maintain good mental health habits.    Interventions  Encouraged to attend Cardiac Rehabilitation for the exercise    Continue Psychosocial Services   Follow up required by staff       Vocational Rehabilitation: Provide vocational rehab assistance to qualifying candidates.   Vocational Rehab Evaluation & Intervention:   Education: Education Goals: Education classes will be provided on a variety of topics geared toward better understanding of heart health and risk factor modification. Participant will state understanding/return demonstration of topics  presented as noted by education test scores.  Learning Barriers/Preferences: Learning Barriers/Preferences - 03/19/20 0947      Learning Barriers/Preferences   Learning Barriers  None    Learning Preferences  None       General Cardiac Education Topics:  AED/CPR: - Group verbal and written instruction with the use of models to demonstrate the basic use of the AED with the basic ABC's of resuscitation.   Cardiac Rehab from 10/31/2018 in [facility] Cardiac and Pulmonary Rehab  Date  10/24/18  Educator  CE  Instruction Review Code  1- Verbalizes Understanding      Anatomy & Physiology of the Heart: - Group verbal and written instruction and models provide basic cardiac anatomy and physiology, with the coronary electrical and arterial systems. Review of Valvular disease and Heart Failure   Cardiac Rehab from 10/31/2018 in [facility] Cardiac and Pulmonary Rehab  Date  10/08/18  Educator  MA  Instruction Review Code  1- Verbalizes Understanding      Cardiac Procedures: - Group verbal and written instruction to review commonly prescribed medications for heart disease. Reviews the medication, class of the drug, and side effects. Includes the steps to properly store meds and maintain the prescription regimen. (beta blockers and nitrates)   Cardiac Rehab from 10/31/2018 in [facility] Cardiac and Pulmonary Rehab  Date  10/22/18  Educator  CE  Instruction Review Code  1- Verbalizes Understanding      Cardiac Medications I: - Group verbal and written instruction to review commonly prescribed medications for heart disease. Reviews the medication, class of the drug,  and side effects. Includes the steps to properly store meds and maintain the prescription regimen.   Cardiac Rehab from 10/31/2018 in [facility] Cardiac and Pulmonary Rehab  Date  10/15/18  Educator  CE  Instruction Review Code  1- Verbalizes Understanding      Cardiac Medications II: -Group verbal and written instruction to review commonly  prescribed medications for heart disease. Reviews the medication, class of the drug, and side effects. (all other drug classes)   Cardiac Rehab from 10/31/2018 in [facility] Cardiac and Pulmonary Rehab  Date  10/03/18  Educator  C.Enterkin, RN  Instruction Review Code  1- Verbalizes Understanding       Go Sex-Intimacy & Heart Disease, Get SMART - Goal Setting: - Group verbal and written instruction through game format to discuss heart disease and the return to sexual intimacy. Provides group verbal and written material to discuss and apply goal setting through the application of the S.M.A.R.T. Method.   Cardiac Rehab from 10/31/2018 in [facility] Cardiac and Pulmonary Rehab  Date  10/22/18  Educator  CE  Instruction Review Code  1- Verbalizes Understanding      Other Matters of the Heart: - Provides group verbal, written materials and models to describe Stable Angina and Peripheral Artery. Includes description of the disease process and treatment options available to the cardiac patient.   Infection Prevention: - Provides verbal and written material to individual with discussion of infection control including proper hand washing and proper equipment cleaning during exercise session.   Cardiac Rehab from 03/30/2020 in [facility] Cardiac and Pulmonary Rehab  Date  03/30/20  Educator  AS  Instruction Review Code  1- Verbalizes Understanding      Falls Prevention: - Provides verbal and written material to individual with discussion of falls prevention and safety.   Cardiac Rehab from 03/30/2020 in [facility] Cardiac and Pulmonary Rehab  Date  03/30/20  Educator  AS  Instruction Review Code  1- Verbalizes Understanding      Other: -Provides group and verbal instruction on various topics (see comments)   Knowledge Questionnaire Score: Knowledge Questionnaire Score - 03/30/20 1625      Knowledge Questionnaire Score   Pre Score  25/26       Core Components/Risk Factors/Patient Goals at  Admission: Personal Goals and Risk Factors at Admission - 03/30/20 1625      Core Components/Risk Factors/Patient Goals on Admission    Weight Management  Yes;Weight Maintenance    Intervention  Weight Management: Develop a combined nutrition and exercise program designed to reach desired caloric intake, while maintaining appropriate intake of nutrient and fiber, sodium and fats, and appropriate energy expenditure required for the weight goal.;Weight Management: Provide education and appropriate resources to help participant work on and attain dietary goals.    Admit Weight  203 lb 6.4 oz (92.3 kg)    Goal Weight: Short Term  200 lb (90.7 kg)    Goal Weight: Long Term  200 lb (90.7 kg)    Expected Outcomes  Short Term: Continue to assess and modify interventions until short term weight is achieved;Long Term: Adherence to nutrition and physical activity/exercise program aimed toward attainment of established weight goal;Weight Maintenance: Understanding of the daily nutrition guidelines, which includes 25-35% calories from fat, 7% or less cal from saturated fats, less than 274m cholesterol, less than 1.5gm of sodium, & 5 or more servings of fruits and vegetables daily;Weight Loss: Understanding of general recommendations for a balanced deficit meal plan, which promotes 1-2 lb weight loss  per week and includes a negative energy balance of [phone] kcal/d;Understanding recommendations for meals to include 15-35% energy as protein, 25-35% energy from fat, 35-60% energy from carbohydrates, less than 288m of dietary cholesterol, 20-35 gm of total fiber daily;Understanding of distribution of calorie intake throughout the day with the consumption of 4-5 meals/snacks    Diabetes  Yes    Intervention  Provide education about signs/symptoms and action to take for hypo/hyperglycemia.;Provide education about proper nutrition, including hydration, and aerobic/resistive exercise prescription along with prescribed  medications to achieve blood glucose in normal ranges: Fasting glucose 65-99 mg/dL    Expected Outcomes  Short Term: Participant verbalizes understanding of the signs/symptoms and immediate care of hyper/hypoglycemia, proper foot care and importance of medication, aerobic/resistive exercise and nutrition plan for blood glucose control.;Long Term: Attainment of HbA1C < 7%.    Intervention  Provide a combined exercise and nutrition program that is supplemented with education, support and counseling about heart failure. Directed toward relieving symptoms such as shortness of breath, decreased exercise tolerance, and extremity edema.    Expected Outcomes  Improve functional capacity of life;Short term: Attendance in program 2-3 days a week with increased exercise capacity. Reported lower sodium intake. Reported increased fruit and vegetable intake. Reports medication compliance.;Long term: Adoption of self-care skills and reduction of barriers for early signs and symptoms recognition and intervention leading to self-care maintenance.;Short term: Daily weights obtained and reported for increase. Utilizing diuretic protocols set by physician.    Intervention  Provide education on lifestyle modifcations including regular physical activity/exercise, weight management, moderate sodium restriction and increased consumption of fresh fruit, vegetables, and low fat dairy, alcohol moderation, and smoking cessation.;Monitor prescription use compliance.    Intervention  Provide education and support for participant on nutrition & aerobic/resistive exercise along with prescribed medications to achieve LDL <711m HDL >4058m   Expected Outcomes  Short Term: Participant states understanding of desired cholesterol values and is compliant with medications prescribed. Participant is following exercise prescription and nutrition guidelines.;Long Term: Cholesterol controlled with medications as prescribed, with individualized  exercise RX and with personalized nutrition plan. Value goals: LDL < 11m3mDL > 40 mg.       Education:Diabetes - Individual verbal and written instruction to review signs/symptoms of diabetes, desired ranges of glucose level fasting, after meals and with exercise. Acknowledge that pre and post exercise glucose checks will be done for 3 sessions at entry of program.   [facility]  Date  03/19/20  Educator  J[facility]  1- Verbalizes Understanding      Education: Know Your Numbers and Risk Factors: -Group verbal and written instruction about important numbers in your health.  Discussion of what are risk factors and how they play a role in the disease process.  Review of Cholesterol, Blood Pressure, Diabetes, and BMI and the role they play in your overall health.   [facility]  Date  10/03/18  Educator  C.Enterkin, RN  Instruction Review Code  1- Verbalizes Understanding      Core Components/Risk Factors/Patient Goals Review:  Goals and Risk Factor Review    Row Name 04/30/20 1716             Core Components/Risk Factors/Patient Goals Review   Personal Goals Review  Weight Management/Obesity;Lipids;Hypertension;Stress;Diabetes       Review  StevRichardson Landryorts taking all medications as prescribed. He checks his blood sugar  several times a day. Blood pressure and lipids are reported to be under control. He is maintaining weight and continues to mainly eat a vegan diet. He cooks mostly at home.  He decided to buy a TM to have at home when walking outside is not possible.       Expected Outcomes  Short: Continue to attend cardiac rehab with regular attendence and continue to monitor BG and take all meds. Long: Continue to control risk factors with heart healthy lifestyle.          Core Components/Risk Factors/Patient Goals at Discharge (Final Review):  Goals and Risk  Factor Review - 04/30/20 1716      Core Components/Risk Factors/Patient Goals Review   Personal Goals Review  Weight Management/Obesity;Lipids;Hypertension;Stress;Diabetes    Review  John Hartman reports taking all medications as prescribed. He checks his blood sugar several times a day. Blood pressure and lipids are reported to be under control. He is maintaining weight and continues to mainly eat a vegan diet. He cooks mostly at home.  He decided to buy a TM to have at home when walking outside is not possible.    Expected Outcomes  Short: Continue to attend cardiac rehab with regular attendence and continue to monitor BG and take all meds. Long: Continue to control risk factors with heart healthy lifestyle.       ITP Comments: ITP Comments    Row Name 03/19/20 0952 04/06/20 1719 04/14/20 1243 04/15/20 0515 05/13/20 0537   ITP Comments  Virtual Orientation performed. Patient informed when to come in for RD and EP orientation. Diagnosis can be found in [facility] 03/04/2020.  First full day of exercise!  Patient was oriented to gym and equipment including functions, settings, policies, and procedures.  Patient's individual exercise prescription and treatment plan were reviewed.  All starting workloads were established based on the results of the 6 minute walk test done at initial orientation visit.  The plan for exercise progression was also introduced and progression will be customized based on patient's performance and goals.  Completed Initial RD Eval  30 Day review completed. Medical Director review done, changes made as directed,and approval shown by signature of Market researcher.  30 Day review completed. ITP review done, changes made as directed,and approval shown by signature of  Scientist, research (life sciences).      Comments:

## 2020-05-14 ENCOUNTER — Encounter: Payer: No Typology Code available for payment source | Admitting: *Deleted

## 2020-05-14 ENCOUNTER — Other Ambulatory Visit: Payer: Self-pay

## 2020-05-14 DIAGNOSIS — I214 Non-ST elevation (NSTEMI) myocardial infarction: Secondary | ICD-10-CM

## 2020-05-14 DIAGNOSIS — I252 Old myocardial infarction: Secondary | ICD-10-CM | POA: Diagnosis not present

## 2020-05-14 DIAGNOSIS — Z955 Presence of coronary angioplasty implant and graft: Secondary | ICD-10-CM

## 2020-05-14 NOTE — Progress Notes (Signed)
Daily Session Note  Patient Details  Name: John Hartman MRN: [id number] Date of Birth: 08/07/1971 Referring Provider:     Cardiac Rehab from 03/30/2020 in [facility] Cardiac and Pulmonary Rehab  Referring Provider  Paraschos      Encounter Date: 05/14/2020  Check In: Session Check In - 05/14/20 1704      Check-In   Supervising physician immediately available to respond to emergencies  See telemetry face sheet for immediately available ER MD    Location  [facility]-Cardiac & Pulmonary Rehab    Staff Present  Earlean Shawl, BS, ACSM CEP, Exercise Physiologist;Colbie Danner Sherryll Burger, RN BSN;Joseph Hood RCP,RRT,BSRT    Virtual Visit  No    Medication changes reported      No    Fall or balance concerns reported     No    Warm-up and Cool-down  Performed on first and last piece of equipment    Resistance Training Performed  Yes    VAD Patient?  No    PAD/SET Patient?  No      Pain Assessment   Currently in Pain?  No/denies          Social History   Tobacco Use  Smoking Status Former Smoker  . Packs/day: 1.00  . Years: 10.00  . Pack years: 10.00  . Types: Cigarettes  . Quit date: 04/15/2003  . Years since quitting: 17.0  Smokeless Tobacco Never Used    Goals Met:  Independence with exercise equipment Exercise tolerated well No report of cardiac concerns or symptoms Strength training completed today  Goals Unmet:  Not Applicable  Comments: Pt able to follow exercise prescription today without complaint.  Will continue to monitor for progression.    Dr. Emily Filbert is Medical Director for [place] and [name].

## 2020-05-18 ENCOUNTER — Encounter: Payer: No Typology Code available for payment source | Admitting: *Deleted

## 2020-05-18 ENCOUNTER — Other Ambulatory Visit: Payer: Self-pay

## 2020-05-18 DIAGNOSIS — I214 Non-ST elevation (NSTEMI) myocardial infarction: Secondary | ICD-10-CM

## 2020-05-18 DIAGNOSIS — Z955 Presence of coronary angioplasty implant and graft: Secondary | ICD-10-CM

## 2020-05-18 DIAGNOSIS — I252 Old myocardial infarction: Secondary | ICD-10-CM | POA: Diagnosis not present

## 2020-05-18 NOTE — Progress Notes (Signed)
Daily Session Note  Patient Details  Name: John Hartman MRN: [id number] Date of Birth: 17-Sep-1971 Referring Provider:     Cardiac Rehab from 03/30/2020 in [facility] Cardiac and Pulmonary Rehab  Referring Provider  Paraschos      Encounter Date: 05/18/2020  Check In: Session Check In - 05/18/20 1724      Check-In   Supervising physician immediately available to respond to emergencies  See telemetry face sheet for immediately available ER MD    Location  [facility]-Cardiac & Pulmonary Rehab    Staff Present  Renita Papa, RN BSN;Joseph [address] [place], [place], ACSM CEP, Exercise Physiologist    Virtual Visit  No    Medication changes reported      No    Fall or balance concerns reported     No    Warm-up and Cool-down  Performed on first and last piece of equipment    Resistance Training Performed  Yes    VAD Patient?  No    PAD/SET Patient?  No      Pain Assessment   Currently in Pain?  No/denies          Social History   Tobacco Use  Smoking Status Former Smoker  . Packs/day: 1.00  . Years: 10.00  . Pack years: 10.00  . Types: Cigarettes  . Quit date: 04/15/2003  . Years since quitting: 17.1  Smokeless Tobacco Never Used    Goals Met:  Independence with exercise equipment Exercise tolerated well No report of cardiac concerns or symptoms Strength training completed today  Goals Unmet:  Not Applicable  Comments: Pt able to follow exercise prescription today without complaint.  Will continue to monitor for progression.    Dr. Emily Filbert is Medical Director for [place] and [name].

## 2020-05-20 ENCOUNTER — Other Ambulatory Visit: Payer: Self-pay

## 2020-05-20 ENCOUNTER — Encounter: Payer: No Typology Code available for payment source | Admitting: *Deleted

## 2020-05-20 DIAGNOSIS — I214 Non-ST elevation (NSTEMI) myocardial infarction: Secondary | ICD-10-CM

## 2020-05-20 DIAGNOSIS — Z955 Presence of coronary angioplasty implant and graft: Secondary | ICD-10-CM

## 2020-05-20 DIAGNOSIS — I252 Old myocardial infarction: Secondary | ICD-10-CM | POA: Diagnosis not present

## 2020-05-20 NOTE — Progress Notes (Signed)
Daily Session Note  Patient Details  Name: John Hartman MRN: [id number] Date of Birth: June 22, 1971 Referring Provider:     Cardiac Rehab from 03/30/2020 in [facility] Cardiac and Pulmonary Rehab  Referring Provider  Paraschos      Encounter Date: 05/20/2020  Check In: Session Check In - 05/20/20 1708      Check-In   Supervising physician immediately available to respond to emergencies  See telemetry face sheet for immediately available ER MD    Location  [facility]-Cardiac & Pulmonary Rehab    Staff Present  Renita Papa, RN BSN;Melissa Caiola RDN, Rowe Pavy, BA, ACSM CEP, Exercise Physiologist    Virtual Visit  No    Medication changes reported      No    Fall or balance concerns reported     No    Warm-up and Cool-down  Performed on first and last piece of equipment    Resistance Training Performed  Yes    VAD Patient?  No    PAD/SET Patient?  No      Pain Assessment   Currently in Pain?  No/denies          Social History   Tobacco Use  Smoking Status Former Smoker  . Packs/day: 1.00  . Years: 10.00  . Pack years: 10.00  . Types: Cigarettes  . Quit date: 04/15/2003  . Years since quitting: 17.1  Smokeless Tobacco Never Used    Goals Met:  Independence with exercise equipment Exercise tolerated well No report of cardiac concerns or symptoms Strength training completed today  Goals Unmet:  Not Applicable  Comments: Pt able to follow exercise prescription today without complaint.  Will continue to monitor for progression.    Dr. Emily Filbert is Medical Director for [place] and [name].

## 2020-05-21 ENCOUNTER — Encounter: Payer: No Typology Code available for payment source | Admitting: *Deleted

## 2020-05-21 DIAGNOSIS — Z955 Presence of coronary angioplasty implant and graft: Secondary | ICD-10-CM

## 2020-05-21 DIAGNOSIS — I252 Old myocardial infarction: Secondary | ICD-10-CM | POA: Diagnosis not present

## 2020-05-21 DIAGNOSIS — I214 Non-ST elevation (NSTEMI) myocardial infarction: Secondary | ICD-10-CM

## 2020-05-21 NOTE — Progress Notes (Signed)
Daily Session Note  Patient Details  Name: John Hartman MRN: [id number] Date of Birth: 1971/05/30 Referring Provider:     Cardiac Rehab from 03/30/2020 in [facility] Cardiac and Pulmonary Rehab  Referring Provider  Paraschos      Encounter Date: 05/21/2020  Check In: Session Check In - 05/21/20 1655      Check-In   Supervising physician immediately available to respond to emergencies  See telemetry face sheet for immediately available ER MD    Location  [facility]-Cardiac & Pulmonary Rehab    Staff Present  Renita Papa, RN BSN;Joseph Hood RCP,RRT,BSRT;Heath Lark, RN, BSN, CCRP    Virtual Visit  No    Medication changes reported      No    Fall or balance concerns reported     No    Warm-up and Cool-down  Performed on first and last piece of equipment    Resistance Training Performed  Yes    VAD Patient?  No    PAD/SET Patient?  No      Pain Assessment   Currently in Pain?  No/denies          Social History   Tobacco Use  Smoking Status Former Smoker  . Packs/day: 1.00  . Years: 10.00  . Pack years: 10.00  . Types: Cigarettes  . Quit date: 04/15/2003  . Years since quitting: 17.1  Smokeless Tobacco Never Used    Goals Met:  Independence with exercise equipment Exercise tolerated well No report of cardiac concerns or symptoms Strength training completed today  Goals Unmet:  Not Applicable  Comments: Pt able to follow exercise prescription today without complaint.  Will continue to monitor for progression.    Dr. Emily Filbert is Medical Director for [place] and [name].

## 2020-05-27 ENCOUNTER — Other Ambulatory Visit: Payer: Self-pay

## 2020-05-27 ENCOUNTER — Encounter: Payer: No Typology Code available for payment source | Attending: Cardiology | Admitting: *Deleted

## 2020-05-27 DIAGNOSIS — I214 Non-ST elevation (NSTEMI) myocardial infarction: Secondary | ICD-10-CM

## 2020-05-27 DIAGNOSIS — I252 Old myocardial infarction: Secondary | ICD-10-CM | POA: Insufficient documentation

## 2020-05-27 DIAGNOSIS — Z955 Presence of coronary angioplasty implant and graft: Secondary | ICD-10-CM

## 2020-05-27 NOTE — Progress Notes (Signed)
Daily Session Note  Patient Details  Name: John Hartman MRN: [id number] Date of Birth: Jun 18, 1971 Referring Provider:     Cardiac Rehab from 03/30/2020 in [facility] Cardiac and Pulmonary Rehab  Referring Provider  Paraschos      Encounter Date: 05/27/2020  Check In: Session Check In - 05/27/20 1700      Check-In   Supervising physician immediately available to respond to emergencies  See telemetry face sheet for immediately available ER MD    Location  [facility]-Cardiac & Pulmonary Rehab    Staff Present  Renita Papa, RN BSN;Melissa Caiola RDN, Rowe Pavy, BA, ACSM CEP, Exercise Physiologist    Virtual Visit  No    Medication changes reported      No    Fall or balance concerns reported     No    Warm-up and Cool-down  Performed on first and last piece of equipment    Resistance Training Performed  Yes    VAD Patient?  No    PAD/SET Patient?  No      Pain Assessment   Currently in Pain?  No/denies          Social History   Tobacco Use  Smoking Status Former Smoker  . Packs/day: 1.00  . Years: 10.00  . Pack years: 10.00  . Types: Cigarettes  . Quit date: 04/15/2003  . Years since quitting: 17.1  Smokeless Tobacco Never Used    Goals Met:  Independence with exercise equipment Exercise tolerated well No report of cardiac concerns or symptoms Strength training completed today  Goals Unmet:  Not Applicable  Comments: Pt able to follow exercise prescription today without complaint.  Will continue to monitor for progression.    Dr. Emily Filbert is Medical Director for [place] and [name].

## 2020-06-01 ENCOUNTER — Other Ambulatory Visit: Payer: Self-pay

## 2020-06-01 DIAGNOSIS — I214 Non-ST elevation (NSTEMI) myocardial infarction: Secondary | ICD-10-CM

## 2020-06-01 DIAGNOSIS — I252 Old myocardial infarction: Secondary | ICD-10-CM | POA: Diagnosis not present

## 2020-06-01 NOTE — Progress Notes (Signed)
Daily Session Note  Patient Details  Name: John Hartman MRN: [id number] Date of Birth: [birth year] Referring Provider:     Cardiac Rehab from 03/30/2020 in [facility] Cardiac and Pul[facility] Rehab  Referring Provider  Paraschos      Encounter Date: 06/01/2020  Check In: Session Check In - 06/01/20 1705      Check-In   Supervising physician immediately available to respond to emergencies  See telemetry face sheet for immediately available ER MD    Location  [facility]-Cardiac & Pul[facility] Re[facility]    Staff Present  Basilia Jumbo, RN, Moises Blood, BS, ACSM CEP, Exercise Physiologist;Joseph Tessie Fass RCP,RRT,BSRT    Virtual Visit  No    Medication changes reported      No    Fall or balance concerns reported     No    Tobacco Cessation  No Change    Warm-up and Cool-down  Performed on first and last piece of equipment    Resistance Training Performed  Yes    VAD Patient?  No    PAD/SET Patient?  No      Pain Assessment   Currently in Pain?  No/denies          Social History   Tobacco Use  Smoking Status Former Smoker  . Packs/day: 1.00  . Years: 10.00  . Pack years: 10.00  . Types: Cigarettes  . Quit date: 04/15/2003  . Years since quitting: 17.1  Smokeless Tobacco Never Used    Goals Met:  Independence with exercise equipment Exercise tolerated well No report of cardiac concerns or symptoms  Goals Unmet:  Not Applicable  Comments: Pt able to follow exercise prescription today without complaint.  Will continue to monitor for progression.    Dr. Emily Filbert is Medical Director for [place] and [name].

## 2020-06-03 ENCOUNTER — Encounter: Payer: No Typology Code available for payment source | Admitting: *Deleted

## 2020-06-03 ENCOUNTER — Other Ambulatory Visit: Payer: Self-pay

## 2020-06-03 DIAGNOSIS — I214 Non-ST elevation (NSTEMI) myocardial infarction: Secondary | ICD-10-CM

## 2020-06-03 DIAGNOSIS — I252 Old myocardial infarction: Secondary | ICD-10-CM | POA: Diagnosis not present

## 2020-06-03 DIAGNOSIS — Z955 Presence of coronary angioplasty implant and graft: Secondary | ICD-10-CM

## 2020-06-03 NOTE — Progress Notes (Signed)
Daily Session Note  Patient Details  Name: John Hartman MRN: [id number] Date of Birth: March 05, 1971 Referring Provider:     Cardiac Rehab from 03/30/2020 in [facility] Cardiac and Pulmonary Rehab  Referring Provider  Paraschos      Encounter Date: 06/03/2020  Check In: Session Check In - 06/03/20 1708      Check-In   Supervising physician immediately available to respond to emergencies  See telemetry face sheet for immediately available ER MD    Location  [facility]-Cardiac & Pulmonary Rehab    Staff Present  Heath Lark, RN, BSN, CCRP;Melissa Caiola RDN, LDN;Laureen Owens Shark, BS, RRT, CPFT    Virtual Visit  No    Medication changes reported      No    Warm-up and Cool-down  Performed on first and last piece of equipment    Resistance Training Performed  Yes    VAD Patient?  No    PAD/SET Patient?  No      Pain Assessment   Currently in Pain?  No/denies          Social History   Tobacco Use  Smoking Status Former Smoker  . Packs/day: 1.00  . Years: 10.00  . Pack years: 10.00  . Types: Cigarettes  . Quit date: 04/15/2003  . Years since quitting: 17.1  Smokeless Tobacco Never Used    Goals Met:  Independence with exercise equipment Exercise tolerated well No report of cardiac concerns or symptoms  Goals Unmet:  Not Applicable  Comments: Pt able to follow exercise prescription today without complaint.  Will continue to monitor for progression.    Dr. Emily Filbert is Medical Director for [place] and [name].

## 2020-06-10 ENCOUNTER — Encounter: Payer: Self-pay | Admitting: *Deleted

## 2020-06-10 DIAGNOSIS — Z955 Presence of coronary angioplasty implant and graft: Secondary | ICD-10-CM

## 2020-06-10 DIAGNOSIS — I214 Non-ST elevation (NSTEMI) myocardial infarction: Secondary | ICD-10-CM

## 2020-06-10 NOTE — Progress Notes (Signed)
Cardiac Individual Treatment Plan  Patient Details  Name: John Hartman MRN: [id number] Date of Birth: 22-Aug-1971 Referring Provider:     Cardiac Rehab from 03/30/2020 in [facility] Cardiac and Pulmonary Rehab  Referring Provider Paraschos      Initial Encounter Date:    Cardiac Rehab from 03/30/2020 in [facility] Cardiac and Pulmonary Rehab  Date 03/30/20      Visit Diagnosis: NSTEMI (non-ST elevated myocardial infarction) [facility])  Status post coronary artery stent placement  Patient's Home Medications on Admission:  Current Outpatient Medications:  .  ACCU-CHEK SOFTCLIX LANCETS lancets, USE AS DIRECTED, Disp: 100 each, Rfl: 0 .  allopurinol (ZYLOPRIM) 100 MG tablet, Take 100 mg by mouth daily. , Disp: , Rfl:  .  amLODipine (NORVASC) 10 MG tablet, Take 1 tablet (10 mg total) by mouth daily., Disp: 30 tablet, Rfl: 1 .  aspirin EC 81 MG tablet, Take 81 mg by mouth daily., Disp: , Rfl:  .  atorvastatin (LIPITOR) 80 MG tablet, Take 1 tablet (80 mg total) by mouth daily at 6 PM. (Patient taking differently: Take 80 mg by mouth daily with supper. ), Disp: 30 tablet, Rfl: 0 .  BELBUCA 450 MCG FILM, Place 1 Film inside cheek 2 (two) times daily., Disp: 60 each, Rfl: 1 .  cetirizine (ZYRTEC) 10 MG tablet, Take 10 mg by mouth at bedtime., Disp: , Rfl:  .  Dapagliflozin-metFORMIN HCl ER (XIGDUO XR) 04-999 MG TB24, Take 2 tablets by mouth daily. , Disp: , Rfl:  .  diphenhydrAMINE (BENADRYL) 25 MG tablet, Take 50 mg by mouth at bedtime., Disp: , Rfl:  .  ezetimibe (ZETIA) 10 MG tablet, Take 1 tablet (10 mg total) by mouth daily., Disp: 30 tablet, Rfl: 11 .  hydrALAZINE (APRESOLINE) 25 MG tablet, Take 1 tablet (25 mg total) by mouth 2 (two) times daily., Disp: 60 tablet, Rfl: 0 .  isosorbide mononitrate (IMDUR) 30 MG 24 hr tablet, Take 30 mg by mouth daily., Disp: , Rfl:  .  Melatonin 5 MG TABS, Take 10 mg by mouth at bedtime. , Disp: , Rfl:  .  Menthol, Topical Analgesic, (BIOFREEZE EX), Apply 1 application  topically 3 (three) times daily as needed (muscle cramps/shoulder pain.)., Disp: , Rfl:  .  metoprolol tartrate (LOPRESSOR) 25 MG tablet, Take 25 mg by mouth 2 (two) times daily., Disp: , Rfl:  .  nitroGLYCERIN (NITROSTAT) 0.4 MG SL tablet, Place 1 tablet (0.4 mg total) under the tongue every 5 (five) minutes as needed for chest pain., Disp: 30 tablet, Rfl: 0 .  ONETOUCH VERIO test strip, USE 2 (TWO) TIMES DAILY USE AS INSTRUCTED., Disp: , Rfl:  .  pregabalin (LYRICA) 50 MG capsule, Take 1 capsule (50 mg total) by mouth 3 (three) times daily., Disp: 90 capsule, Rfl: 5 .  RYBELSUS 3 MG TABS, Take 3 mg by mouth daily at 6 (six) AM. , Disp: , Rfl:  .  tamsulosin (FLOMAX) 0.4 MG CAPS capsule, Take 1 capsule (0.4 mg total) by mouth daily after supper., Disp: 30 capsule, Rfl: 1 .  tiZANidine (ZANAFLEX) 4 MG tablet, Take 1 tablet (4 mg total) by mouth every 8 (eight) hours as needed for muscle spasms., Disp: 90 tablet, Rfl: 5 .  triamcinolone cream (KENALOG) 0.1 %, Apply topically 3 (three) times daily., Disp: 30 g, Rfl: 0 .  vitamin B-12 (CYANOCOBALAMIN) 1000 MCG tablet, Take 1,000 mcg by mouth 3 (three) times a week., Disp: , Rfl:   Past Medical History: Past Medical History:  Diagnosis  Date  . Angioedema   . Bronchitis   . DDD (degenerative disc disease), cervical   . Diabetes mellitus ([place])   . Fatty liver 03/31/2017   [place] April 2018  . GERD (gastroesophageal reflux disease)   . Gout   . Heart attack ([place])   . HTN (hypertension)   . Hypertension    CONTROLLED ON MEDS  . IBS (irritable bowel syndrome)   . Kidney stone   . LVH (left ventricular hypertrophy) 11/24/2018   Moderate, ECHO  . Myocardial infarction ([place]) 08/23/2018  . Seasonal allergies   . Spinal stenosis     Tobacco Use: Social History   Tobacco Use  Smoking Status Former Smoker  . Packs/day: 1.00  . Years: 10.00  . Pack years: 10.00  . Types: Cigarettes  . Quit date: 04/15/2003  . Years since quitting: 17.1    Smokeless Tobacco Never Used    Labs: Recent Review Flowsheet Data    Labs for ITP Cardiac and Pulmonary Rehab Latest Ref Rng & Units 02/14/2019 03/28/2019 10/18/2019 01/06/2020 02/08/2020   Cholestrol 0 - 200 mg/dL 110 103 - 135 145   LDLCALC 0 - 99 mg/dL 59 58 - 78 95   HDL >40 mg/dL 32(L) 25(L) - 33(L) 33(L)   Trlycerides <150 mg/dL 108 116 - 140 84   Hemoglobin A1c 4.8 - 5.6 % 6.1(H) - 7.2 - 6.4(H)       Exercise Target Goals: Exercise Program Goal: Individual exercise prescription set using results from initial 6 min walk test and THRR while considering  patient's activity barriers and safety.   Exercise Prescription Goal: Initial exercise prescription builds to 30-45 minutes a day of aerobic activity, 2-3 days per week.  Home exercise guidelines will be given to patient during program as part of exercise prescription that the participant will acknowledge.   Education: Aerobic Exercise & Resistance Training: - Gives group verbal and written instruction on the various components of exercise. Focuses on aerobic and resistive training programs and the benefits of this training and how to safely progress through these programs..   Cardiac Rehab from 10/31/2018 in [facility] Cardiac and Pulmonary Rehab  Date 09/24/18  Educator [facility]  Instruction Review Code 1- [employer] Understanding      Education: Exercise & Equipment Safety: - Individual verbal instruction and demonstration of equipment use and safety with use of the equipment.   Cardiac Rehab from 03/30/2020 in [facility] Cardiac and Pulmonary Rehab  Date 03/30/20  Educator AS  Instruction Review Code 1- Verbalizes Understanding      Education: Exercise Physiology & General Exercise Guidelines: - Group verbal and written instruction with models to review the exercise physiology of the cardiovascular system and associated critical values. Provides general exercise guidelines with specific guidelines to those with heart or lung disease.     Cardiac Rehab from 10/31/2018 in [facility] Cardiac and Pulmonary Rehab  Date 09/19/18  Educator [facility]  Instruction Review Code 1- Verbalizes Understanding      Education: Flexibility, Balance, Mind/Body Relaxation: Provides group verbal/written instruction on the benefits of flexibility and balance training, including mind/body exercise modes such as yoga, pilates and tai chi.  Demonstration and skill practice provided.   Cardiac Rehab from 10/31/2018 in [facility] Cardiac and Pulmonary Rehab  Date 10/01/18  Educator AS  Instruction Review Code 1- Verbalizes Understanding      Activity Barriers & Risk Stratification:   6 Minute Walk:  6 Minute Walk    Row Name 03/30/20 [id number]  6 Minute Walk   Phase Initial     Distance 1905 feet     Walk Time 6 minutes     # of Rest Breaks 0     MPH 3.6     METS 4.25     RPE 12     Perceived Dyspnea  1     VO2 Peak 14.9     Symptoms No     Resting HR 93 bpm     Resting BP 114/76     Resting Oxygen Saturation  97 %     Exercise Oxygen Saturation  during 6 min walk 97 %     Max Ex. HR 129 bpm     Max Ex. BP 128/74     2 Minute Post BP 120/68            Oxygen Initial Assessment:   Oxygen Re-Evaluation:   Oxygen Discharge (Final Oxygen Re-Evaluation):   Initial Exercise Prescription:  Initial Exercise Prescription - 03/30/20 1600      Date of Initial Exercise RX and Referring Provider   Date 03/30/20    Referring Provider Paraschos      Treadmill   MPH 3.6    Grade 1    Minutes 15    METs 4.25      Elliptical   Level 1    Speed 3    Minutes 15    METs 4      REL-XR   Level 4    Speed 50    Minutes 15    METs 4      Prescription Details   Frequency (times per week) 3    Duration Progress to 30 minutes of continuous aerobic without signs/symptoms of physical distress      Intensity   THRR 40-80% of Max Heartrate 125-157    Ratings of Perceived Exertion 11-15    Perceived Dyspnea 0-4      Resistance Training     Training Prescription Yes    Weight 3 lb    Reps 10-15           Perform Capillary Blood Glucose checks as needed.  Exercise Prescription Changes:  Exercise Prescription Changes    Row Name 03/30/20 1600 04/15/20 1100 04/16/20 1700 04/28/20 1400 05/11/20 1400     Response to Exercise   Blood Pressure (Admit) 114/76 122/78 -- 100/60 130/80   Blood Pressure (Exercise) 128/74 162/80 -- 160/80 168/86   Blood Pressure (Exit) 120/68 118/68 -- 108/64 128/72   Heart Rate (Admit) 93 bpm 81 bpm -- 95 bpm 95 bpm   Heart Rate (Exercise) 125 bpm 165 bpm -- 156 bpm 168 bpm   Heart Rate (Exit) 95 bpm 126 bpm -- 93 bpm 136 bpm   Rating of Perceived Exertion (Exercise) 12 14 -- 15 15   Perceived Dyspnea (Exercise) 1 -- -- -- --   Symptoms none none -- none back pain elliptical   Duration -- Continue with 30 min of aerobic exercise without signs/symptoms of physical distress. Continue with 30 min of aerobic exercise without signs/symptoms of physical distress. Continue with 30 min of aerobic exercise without signs/symptoms of physical distress. Continue with 30 min of aerobic exercise without signs/symptoms of physical distress.   Intensity -- THRR unchanged THRR unchanged THRR unchanged THRR unchanged     Progression   Progression -- Continue to progress workloads to maintain intensity without signs/symptoms of physical distress. Continue to progress workloads to maintain intensity without signs/symptoms of physical  distress. Continue to progress workloads to maintain intensity without signs/symptoms of physical distress. Continue to progress workloads to maintain intensity without signs/symptoms of physical distress.   Average METs -- 4.57 4.57 6.5 3.75     Resistance Training   Training Prescription -- Yes Yes Yes Yes   Weight -- 5 lb 5 lb 7 lb 7 lb   Reps -- 10-15 10-15 10-15 10-15     Interval Training   Interval Training -- No No No No     Treadmill   MPH -- 3.6 3.6 3.6 3.6   Grade --  _0 1.5   Minutes -- _1 METs -- 4.25 4.25 4.25 4.5     Recumbant Elliptical   Level -- -- -- -- 2   Minutes -- -- -- -- 15   METs -- -- -- -- 3     Elliptical   Level -- 1 1 -- 1   Speed -- 3.7 3.7 -- 3   Minutes -- 15 15 -- 15     REL-XR   Level -- _2 --   Speed -- -- -- 50 --   Minutes -- _3 --   METs -- 4.9 4.9 8.8 --     Home Exercise Plan   Plans to continue exercise at -- -- Home (comment)  walking in neighborhood and TM at home on off days of rehab -- Home (comment)  walking in neighborhood and TM at home on off days of rehab   Frequency -- -- Add 2 additional days to program exercise sessions. -- Add 2 additional days to program exercise sessions.   Initial Home Exercises Provided -- -- 04/16/20 -- 04/16/20   Row Name 05/26/20 1500             Response to Exercise   Blood Pressure (Admit) 120/62       Blood Pressure (Exercise) 158/80       Blood Pressure (Exit) 118/62       Heart Rate (Admit) 88 bpm       Heart Rate (Exercise) 146 bpm       Heart Rate (Exit) 127 bpm       Rating of Perceived Exertion (Exercise) 15       Duration Continue with 30 min of aerobic exercise without signs/symptoms of physical distress.       Intensity THRR unchanged         Progression   Progression Continue to progress workloads to maintain intensity without signs/symptoms of physical distress.       Average METs 6.9         Resistance Training   Training Prescription Yes       Weight 7 lb       Reps 10-15         Interval Training   Interval Training No         Treadmill   MPH 3.6       Grade 1       Minutes 15       METs 4.25         REL-XR   Level 8       Speed 50       Minutes 15       METs 9.6         Home Exercise Plan   Plans to continue exercise at Home (comment)  walking in neighborhood and TM at home  on off days of rehab       Frequency Add 2 additional days to program exercise sessions.       Initial Home Exercises Provided  04/16/20              Exercise Comments:   Exercise Goals and Review:  Exercise Goals    Row Name 03/30/20 1626             Exercise Goals   Increase Physical Activity Yes       Intervention Provide advice, education, support and counseling about physical activity/exercise needs.;Develop an individualized exercise prescription for aerobic and resistive training based on initial evaluation findings, risk stratification, comorbidities and participant's personal goals.       Expected Outcomes Short Term: Attend rehab on a regular basis to increase amount of physical activity.;Long Term: Add in home exercise to make exercise part of routine and to increase amount of physical activity.;Long Term: Exercising regularly at least 3-5 days a week.       Increase Strength and Stamina Yes       Intervention Provide advice, education, support and counseling about physical activity/exercise needs.;Develop an individualized exercise prescription for aerobic and resistive training based on initial evaluation findings, risk stratification, comorbidities and participant's personal goals.       Expected Outcomes Short Term: Increase workloads from initial exercise prescription for resistance, speed, and METs.;Short Term: Perform resistance training exercises routinely during rehab and add in resistance training at home;Long Term: Improve cardiorespiratory fitness, muscular endurance and strength as measured by increased METs and functional capacity (6MWT)       Able to understand and use rate of perceived exertion (RPE) scale Yes       Intervention Provide education and explanation on how to use RPE scale       Expected Outcomes Short Term: Able to use RPE daily in rehab to express subjective intensity level;Long Term:  Able to use RPE to guide intensity level when exercising independently       Able to understand and use Dyspnea scale Yes       Intervention Provide education and explanation on how to use  Dyspnea scale       Expected Outcomes Short Term: Able to use Dyspnea scale daily in rehab to express subjective sense of shortness of breath during exertion;Long Term: Able to use Dyspnea scale to guide intensity level when exercising independently       Knowledge and understanding of Target Heart Rate Range (THRR) Yes       Intervention Provide education and explanation of THRR including how the numbers were predicted and where they are located for reference       Expected Outcomes Short Term: Able to state/look up THRR;Short Term: Able to use daily as guideline for intensity in rehab;Long Term: Able to use THRR to govern intensity when exercising independently       Able to check pulse independently Yes       Intervention Provide education and demonstration on how to check pulse in carotid and radial arteries.;Review the importance of being able to check your own pulse for safety during independent exercise       Expected Outcomes Short Term: Able to explain why pulse checking is important during independent exercise;Long Term: Able to check pulse independently and accurately       Understanding of Exercise Prescription Yes       Intervention Provide education, explanation, and written materials on patient's individual exercise prescription  Expected Outcomes Short Term: Able to explain program exercise prescription;Long Term: Able to explain home exercise prescription to exercise independently              Exercise Goals Re-Evaluation :  Exercise Goals Re-Evaluation    Row Name 04/06/20 1721 04/15/20 1108 04/16/20 1715 04/28/20 1444 04/30/20 1711     Exercise Goal Re-Evaluation   Exercise Goals Review Increase Physical Activity;Able to understand and use rate of perceived exertion (RPE) scale;Knowledge and understanding of Target Heart Rate Range (THRR);Understanding of Exercise Prescription;Increase Strength and Stamina;Able to check pulse independently Increase Physical  Activity;Increase Strength and Stamina;Understanding of Exercise Prescription Increase Physical Activity;Increase Strength and Stamina;Understanding of Exercise Prescription;Able to understand and use rate of perceived exertion (RPE) scale;Knowledge and understanding of Target Heart Rate Range (THRR);Able to check pulse independently Increase Physical Activity;Increase Strength and Stamina;Able to understand and use rate of perceived exertion (RPE) scale;Able to understand and use Dyspnea scale;Knowledge and understanding of Target Heart Rate Range (THRR);Able to check pulse independently;Understanding of Exercise Prescription Increase Physical Activity;Increase Strength and Stamina;Able to understand and use rate of perceived exertion (RPE) scale;Able to understand and use Dyspnea scale;Knowledge and understanding of Target Heart Rate Range (THRR);Able to check pulse independently;Understanding of Exercise Prescription   Comments Reviewed RPE scale, THR and program prescription with pt today.  Pt voiced understanding and was given a copy of goals to take home. [name] is off to a good start in rehab.  He is already doing well on the elliptical and up to level 6 on the XR.  We will continue to monitor his progress. Reviewed home exercise with pt today.  Pt plans to walk on TM and in neighborhood for exercise.  Reviewed THR, pulse, RPE, sign and symptoms, NTG use, and when to call 911 or MD.  Also discussed weather considerations and indoor options.  Pt voiced understanding. [name] is progressing well.  He works in correct HR and RPE ranges.  He has moved up to 7 lb for strength work. [name] has shown increases in workloads during rehab sessions. He has increased his elevation to 1.5% on the TM. The recumbant elliptical is tolerated better then the upright ellipticial due to back pain. He continues to make progress and increase in strength and stamina. [name] also reports walking regularly at home.   Expected Outcomes  Short: Use RPE daily to regulate intensity. Long: Follow program prescription in THR. Short: Continue to improve workloads  Long: Continue to follow program prescription Short: Add 1-2 days of exercise at home on off days of cardiac rehab. Long: Become independent with exercise routine. Short:  continue to follow program prescription Long: increase overall MET level Short: Increase elevation on TM to 2% Long: Continue with independent exercise routine and increase strength and stamina.   [place] Name 05/11/20 1447 05/26/20 1601           Exercise Goal Re-Evaluation   Exercise Goals Review Increase Physical Activity;Increase Strength and Stamina;Understanding of Exercise Prescription Increase Physical Activity;Increase Strength and Stamina;Understanding of Exercise Prescription      Comments [name] continues to do well in rehab.  He is enjoying being back in class again.  We have rotated him off the upright elliptical with his back.  We will continue to encourage him to increase elevation on treadmill and workload on recumbent elliptical.  We will continue to monitor his progress. [name] is progressing well and is at level 8 on the XR.  He attends consistently and works in [employer]  range.      Expected Outcomes Short: Increase treadmill elevation  Long: Continue to improve stamina. Short: progress workloads on all equipment Long: improve overall MET level             Discharge Exercise Prescription (Final Exercise Prescription Changes):  Exercise Prescription Changes - 05/26/20 1500      Response to Exercise   Blood Pressure (Admit) 120/62    Blood Pressure (Exercise) 158/80    Blood Pressure (Exit) 118/62    Heart Rate (Admit) 88 bpm    Heart Rate (Exercise) 146 bpm    Heart Rate (Exit) 127 bpm    Rating of Perceived Exertion (Exercise) 15    Duration Continue with 30 min of aerobic exercise without signs/symptoms of physical distress.    Intensity THRR unchanged      Progression   Progression  Continue to progress workloads to maintain intensity without signs/symptoms of physical distress.    Average METs 6.9      Resistance Training   Training Prescription Yes    Weight 7 lb    Reps 10-15      Interval Training   Interval Training No      Treadmill   MPH 3.6    Grade 1    Minutes 15    METs 4.25      REL-XR   Level 8    Speed 50    Minutes 15    METs 9.6      Home Exercise Plan   Plans to continue exercise at Home (comment)   walking in neighborhood and TM at home on off days of rehab   Frequency Add 2 additional days to program exercise sessions.    Initial Home Exercises Provided 04/16/20           Nutrition:  Target Goals: Understanding of nutrition guidelines, daily intake of sodium <1520m, cholesterol <2060m calories 30% from fat and 7% or less from saturated fats, daily to have 5 or more servings of fruits and vegetables.  Education: Controlling Sodium/Reading Food Labels -Group verbal and written material supporting the discussion of sodium use in heart healthy nutrition. Review and explanation with models, verbal and written materials for utilization of the food label.   [facility]  Date 10/31/18  Educator LB  Instruction Review Code 1- Verbalizes Understanding      Education: General Nutrition Guidelines/Fats and Fiber: -Group instruction provided by verbal, written material, models and posters to present the general guidelines for heart healthy nutrition. Gives an explanation and review of dietary fats and fiber.   [facility]  Date 10/29/18  Educator LB  Instruction Review Code 1- Verbalizes Understanding      Biometrics:  Pre Biometrics - 03/30/20 1627      Pre Biometrics   Height 5' 11.25" (1.81 m)    Weight 203 lb 6.4 oz (92.3 kg)    BMI (Calculated) 28.16    Single Leg Stand 30 seconds            Nutrition Therapy  Plan and Nutrition Goals:  Nutrition Therapy & Goals - 04/14/20 1231      Nutrition Therapy   Diet HH, Low Na, DM    Drug/Food Interactions Purine/Gout    Protein (specify units) 75-80g    Fiber 30 grams    Whole Grain Foods 3 servings    Saturated Fats 12 max. grams  Fruits and Vegetables 8 servings/day    Sodium 1.5 grams      Personal Nutrition Goals   Nutrition Goal LT: maintain HH lifestyle under supervision    Comments Predominatley whole food plant based diet with some meals from the meal service purple carrot as well as calcium supplements and fish 2x/week. Pt taking B12 supplement. Reviewed HH eating, vitamin D recommendations, variety, and protein.      Intervention Plan   Intervention Prescribe, educate and counsel regarding individualized specific dietary modifications aiming towards targeted core components such as weight, hypertension, lipid management, diabetes, heart failure and other comorbidities.    Expected Outcomes Short Term Goal: Understand basic principles of dietary content, such as calories, fat, sodium, cholesterol and nutrients.;Short Term Goal: A plan has been developed with personal nutrition goals set during dietitian appointment.;Long Term Goal: Adherence to prescribed nutrition plan.           Nutrition Assessments:  Nutrition Assessments - 03/30/20 1624      MEDFICTS Scores   Pre Score 0           MEDIFICTS Score Key:          ?70 Need to make dietary changes          40-70 Heart Healthy Diet         ? 40 Therapeutic Level Cholesterol Diet  Nutrition Goals Re-Evaluation:  Nutrition Goals Re-Evaluation    [place] Name 04/20/20 1700             Goals   Nutrition Goal LT: maintain [place] lifestyle under supervision       Comment Pt would like to continue with current heart healthy changes and mostly vegan diet, no questions at this time.       Expected Outcome LT: maintain [place] lifestyle under supervision              Nutrition Goals  Discharge (Final Nutrition Goals Re-Evaluation):  Nutrition Goals Re-Evaluation - 04/20/20 1700      Goals   Nutrition Goal LT: maintain [place] lifestyle under supervision    Comment Pt would like to continue with current heart healthy changes and mostly vegan diet, no questions at this time.    Expected Outcome LT: maintain HH lifestyle under supervision           Psychosocial: Target Goals: Acknowledge presence or absence of significant depression and/or stress, maximize coping skills, provide positive support system. Participant is able to verbalize types and ability to use techniques and skills needed for reducing stress and depression.   Education: Depression - Provides group verbal and written instruction on the correlation between heart/lung disease and depressed mood, treatment options, and the stigmas associated with seeking treatment.   Education: Sleep Hygiene -Provides group verbal and written instruction about how sleep can affect your health.  Define sleep hygiene, discuss sleep cycles and impact of sleep habits. Review good sleep hygiene tips.     Education: Stress and Anxiety: - Provides group verbal and written instruction about the health risks of elevated stress and causes of high stress.  Discuss the correlation between heart/lung disease and anxiety and treatment options. Review healthy ways to manage with stress and anxiety.   Cardiac Rehab from 10/31/2018 in [facility] Cardiac and Pulmonary Rehab  Date 10/10/18  Educator Lucianne Lei, MSW  Instruction Review Code 1- Verbalizes Understanding       Initial Review & Psychosocial Screening:  Initial Psych Review & Screening - 03/19/20 1610  Initial Review   Current issues with None Identified      Family Dynamics   Good Support System? Yes    Comments He can look to his parents, brother and wife for support.      Barriers   Psychosocial barriers to participate in program There are no identifiable barriers or  psychosocial needs.;The patient should benefit from training in stress management and relaxation.      Screening Interventions   Interventions Encouraged to exercise;Program counselor consult;To provide support and resources with identified psychosocial needs;Provide feedback about the scores to participant    Expected Outcomes Short Term goal: Utilizing psychosocial counselor, staff and physician to assist with identification of specific Stressors or current issues interfering with healing process. Setting desired goal for each stressor or current issue identified.;Long Term Goal: Stressors or current issues are controlled or eliminated.;Short Term goal: Identification and review with participant of any Quality of Life or Depression concerns found by scoring the questionnaire.;Long Term goal: The participant improves quality of Life and PHQ9 Scores as seen by post scores and/or verbalization of changes           Quality of Life Scores:   Quality of Life - 03/30/20 1623      Quality of Life   Select Quality of Life      Quality of Life Scores   Health/Function Pre 20.13 %    Socioeconomic Pre 22.71 %    Psych/Spiritual Pre 22.14 %    Family Pre 29.5 %    GLOBAL Pre 22.46 %          Scores of 19 and below usually indicate a poorer quality of life in these areas.  A difference of  2-3 points is a clinically meaningful difference.  A difference of 2-3 points in the total score of the Quality of Life Index has been associated with significant improvement in overall quality of life, self-image, physical symptoms, and general health in studies assessing change in quality of life.  PHQ-9: Recent Review Flowsheet Data    Depression screen [facility] 2/9 03/30/2020 03/04/2020 01/06/2020 12/23/2019 03/28/2019   Decreased Interest 0 0 0 0 0   Down, Depressed, Hopeless 0 0 0 0 0   PHQ - 2 Score 0 0 0 0 0   Altered sleeping 0 0 0 0 0   Tired, decreased energy 1 0 1 1 0   Change in appetite 0 0 1 1 0    Feeling bad or failure about yourself  0 0 0 0 0   Trouble concentrating 0 0 0 0 0   Moving slowly or fidgety/restless 0 0 0 0 0   Suicidal thoughts 0 0 0 0 0   PHQ-9 Score 1 0 2 2 0   Difficult doing work/chores Somewhat difficult - Not difficult at all Not difficult at all Not difficult at all     Interpretation of Total Score  Total Score Depression Severity:  1-4 = Minimal depression, 5-9 = Mild depression, 10-14 = Moderate depression, 15-19 = Moderately severe depression, 20-27 = Severe depression   Psychosocial Evaluation and Intervention:  Psychosocial Evaluation - 03/19/20 0949      Psychosocial Evaluation & Interventions   Interventions Encouraged to exercise with the program and follow exercise prescription    Comments [name] has done the program before and has a positive outlook on his health    Expected Outcomes Short: Attend [facility] stress management education to decrease stress. Long: Maintain exercise Post [facility] to  keep stress at a minimum.    Continue Psychosocial Services  Follow up required by staff           Psychosocial Re-Evaluation:  Psychosocial Re-Evaluation    [place] Name 04/30/20 1727             Psychosocial Re-Evaluation   Current issues with Current Stress Concerns       Comments [name] reports  that he is settled in his new house and is sleeping well. No new stress concerns reported. He did recently do a sleep study which showed that he does have sleep apnea and he is going to be getting a C-pap machine soon to help with this. He does say he feels well rested in the morning and has energy during the day.       Expected Outcomes Short: Get and start using C-pap machine to help with sleep apnea. Long: Maintain good mental health habits.       Interventions Encouraged to attend Cardiac Rehabilitation for the exercise       Continue Psychosocial Services  Follow up required by staff              Psychosocial Discharge (Final Psychosocial  Re-Evaluation):  Psychosocial Re-Evaluation - 04/30/20 1727      Psychosocial Re-Evaluation   Current issues with Current Stress Concerns    Comments [name] reports  that he is settled in his new house and is sleeping well. No new stress concerns reported. He did recently do a sleep study which showed that he does have sleep apnea and he is going to be getting a C-pap machine soon to help with this. He does say he feels well rested in the morning and has energy during the day.    Expected Outcomes Short: Get and start using C-pap machine to help with sleep apnea. Long: Maintain good mental health habits.    Interventions Encouraged to attend Cardiac Rehabilitation for the exercise    Continue Psychosocial Services  Follow up required by staff           Vocational Rehabilitation: Provide vocational rehab assistance to qualifying candidates.   Vocational Rehab Evaluation & Intervention:   Education: Education Goals: Education classes will be provided on a variety of topics geared toward better understanding of heart health and risk factor modification. Participant will state understanding/return demonstration of topics presented as noted by education test scores.  Learning Barriers/Preferences:  Learning Barriers/Preferences - 03/19/20 0947      Learning Barriers/Preferences   Learning Barriers None    Learning Preferences None           General Cardiac Education Topics:  AED/CPR: - Group verbal and written instruction with the use of models to demonstrate the basic use of the AED with the basic ABC's of resuscitation.   Cardiac Rehab from 10/31/2018 in [facility] Cardiac and Pulmonary Rehab  Date 10/24/18  Educator CE  Instruction Review Code 1- Verbalizes Understanding      Anatomy & Physiology of the Heart: - Group verbal and written instruction and models provide basic cardiac anatomy and physiology, with the coronary electrical and arterial systems. Review of Valvular disease  and Heart Failure   Cardiac Rehab from 10/31/2018 in [facility] Cardiac and Pulmonary Rehab  Date 10/08/18  Educator MA  Instruction Review Code 1- Verbalizes Understanding      Cardiac Procedures: - Group verbal and written instruction to review commonly prescribed medications for heart disease. Reviews the medication, class of the drug, and  side effects. Includes the steps to properly store meds and maintain the prescription regimen. (beta blockers and nitrates)   Cardiac Rehab from 10/31/2018 in [facility] Cardiac and Pulmonary Rehab  Date 10/22/18  Educator CE  Instruction Review Code 1- Verbalizes Understanding      Cardiac Medications I: - Group verbal and written instruction to review commonly prescribed medications for heart disease. Reviews the medication, class of the drug, and side effects. Includes the steps to properly store meds and maintain the prescription regimen.   Cardiac Rehab from 10/31/2018 in [facility] Cardiac and Pulmonary Rehab  Date 10/15/18  Educator CE  Instruction Review Code 1- Verbalizes Understanding      Cardiac Medications II: -Group verbal and written instruction to review commonly prescribed medications for heart disease. Reviews the medication, class of the drug, and side effects. (all other drug classes)   Cardiac Rehab from 10/31/2018 in [facility] Cardiac and Pulmonary Rehab  Date 10/03/18  Educator C.Enterkin, RN  Instruction Review Code 1- Verbalizes Understanding       Go Sex-Intimacy & Heart Disease, Get SMART - Goal Setting: - Group verbal and written instruction through game format to discuss heart disease and the return to sexual intimacy. Provides group verbal and written material to discuss and apply goal setting through the application of the S.M.A.R.T. Method.   Cardiac Rehab from 10/31/2018 in [facility] Cardiac and Pulmonary Rehab  Date 10/22/18  Educator CE  Instruction Review Code 1- Verbalizes Understanding      Other Matters of the Heart: -  Provides group verbal, written materials and models to describe Stable Angina and Peripheral Artery. Includes description of the disease process and treatment options available to the cardiac patient.   Infection Prevention: - Provides verbal and written material to individual with discussion of infection control including proper hand washing and proper equipment cleaning during exercise session.   Cardiac Rehab from 03/30/2020 in [facility] Cardiac and Pulmonary Rehab  Date 03/30/20  Educator AS  Instruction Review Code 1- Verbalizes Understanding      Falls Prevention: - Provides verbal and written material to individual with discussion of falls prevention and safety.   Cardiac Rehab from 03/30/2020 in [facility] Cardiac and Pulmonary Rehab  Date 03/30/20  Educator AS  Instruction Review Code 1- Verbalizes Understanding      Other: -Provides group and verbal instruction on various topics (see comments)   Knowledge Questionnaire Score:  Knowledge Questionnaire Score - 03/30/20 1625      Knowledge Questionnaire Score   Pre Score 25/26           Core Components/Risk Factors/Patient Goals at Admission:  Personal Goals and Risk Factors at Admission - 03/30/20 1625      Core Components/Risk Factors/Patient Goals on Admission    Weight Management Yes;Weight Maintenance    Intervention Weight Management: Develop a combined nutrition and exercise program designed to reach desired caloric intake, while maintaining appropriate intake of nutrient and fiber, sodium and fats, and appropriate energy expenditure required for the weight goal.;Weight Management: Provide education and appropriate resources to help participant work on and attain dietary goals.    Admit Weight 203 lb 6.4 oz (92.3 kg)    Goal Weight: Short Term 200 lb (90.7 kg)    Goal Weight: Long Term 200 lb (90.7 kg)    Expected Outcomes Short Term: Continue to assess and modify interventions until short term weight is achieved;Long Term:  Adherence to nutrition and physical activity/exercise program aimed toward attainment of established weight goal;Weight  Maintenance: Understanding of the daily nutrition guidelines, which includes 25-35% calories from fat, 7% or less cal from saturated fats, less than 258m cholesterol, less than 1.5gm of sodium, & 5 or more servings of fruits and vegetables daily;Weight Loss: Understanding of general recommendations for a balanced deficit meal plan, which promotes 1-2 lb weight loss per week and includes a negative energy balance of [phone] kcal/d;Understanding recommendations for meals to include 15-35% energy as protein, 25-35% energy from fat, 35-60% energy from carbohydrates, less than 2089mof dietary cholesterol, 20-35 gm of total fiber daily;Understanding of distribution of calorie intake throughout the day with the consumption of 4-5 meals/snacks    Diabetes Yes    Intervention Provide education about signs/symptoms and action to take for hypo/hyperglycemia.;Provide education about proper nutrition, including hydration, and aerobic/resistive exercise prescription along with prescribed medications to achieve blood glucose in normal ranges: Fasting glucose 65-99 mg/dL    Expected Outcomes Short Term: Participant verbalizes understanding of the signs/symptoms and immediate care of hyper/hypoglycemia, proper foot care and importance of medication, aerobic/resistive exercise and nutrition plan for blood glucose control.;Long Term: Attainment of HbA1C < 7%.    Intervention Provide a combined exercise and nutrition program that is supplemented with education, support and counseling about heart failure. Directed toward relieving symptoms such as shortness of breath, decreased exercise tolerance, and extremity edema.    Expected Outcomes Improve functional capacity of life;Short term: Attendance in program 2-3 days a week with increased exercise capacity. Reported lower sodium intake. Reported increased fruit  and vegetable intake. Reports medication compliance.;Long term: Adoption of self-care skills and reduction of barriers for early signs and symptoms recognition and intervention leading to self-care maintenance.;Short term: Daily weights obtained and reported for increase. Utilizing diuretic protocols set by physician.    Intervention Provide education on lifestyle modifcations including regular physical activity/exercise, weight management, moderate sodium restriction and increased consumption of fresh fruit, vegetables, and low fat dairy, alcohol moderation, and smoking cessation.;Monitor prescription use compliance.    Intervention Provide education and support for participant on nutrition & aerobic/resistive exercise along with prescribed medications to achieve LDL <7042mHDL >42m38m  Expected Outcomes Short Term: Participant states understanding of desired cholesterol values and is compliant with medications prescribed. Participant is following exercise prescription and nutrition guidelines.;Long Term: Cholesterol controlled with medications as prescribed, with individualized exercise RX and with personalized nutrition plan. Value goals: LDL < 70mg66mL > 40 mg.           Education:Diabetes - Individual verbal and written instruction to review signs/symptoms of diabetes, desired ranges of glucose level fasting, after meals and with exercise. Acknowledge that pre and post exercise glucose checks will be done for 3 sessions at entry of program.   [facility]  Date 03/19/20  Educator JH  [facility] Review Code 1- Verbalizes Understanding      Education: Know Your Numbers and Risk Factors: -Group verbal and written instruction about important numbers in your health.  Discussion of what are risk factors and how they play a role in the disease process.  Review of Cholesterol, Blood Pressure, Diabetes, and BMI and the role they play in your  overall health.   [facility]  Date 10/03/18  Educator C.Enterkin, RN  Instruction Review Code 1- VerbaCounselling psychologists Review:   Goals and Risk Factor Review    Row Name  04/30/20 1716             Core Components/Risk Factors/Patient Goals Review   Personal Goals Review Weight Management/Obesity;Lipids;Hypertension;Stress;Diabetes       Review [name] reports taking all medications as prescribed. He checks his blood sugar several times a day. Blood pressure and lipids are reported to be under control. He is maintaining weight and continues to mainly eat a vegan diet. He cooks mostly at home.  He decided to buy a TM to have at home when walking outside is not possible.       Expected Outcomes Short: Continue to attend cardiac rehab with regular attendence and continue to monitor BG and take all meds. Long: Continue to control risk factors with heart healthy lifestyle.              Core Components/Risk Factors/Patient Goals at Discharge (Final Review):   Goals and Risk Factor Review - 04/30/20 1716      Core Components/Risk Factors/Patient Goals Review   Personal Goals Review Weight Management/Obesity;Lipids;Hypertension;Stress;Diabetes    Review [name] reports taking all medications as prescribed. He checks his blood sugar several times a day. Blood pressure and lipids are reported to be under control. He is maintaining weight and continues to mainly eat a vegan diet. He cooks mostly at home.  He decided to buy a TM to have at home when walking outside is not possible.    Expected Outcomes Short: Continue to attend cardiac rehab with regular attendence and continue to monitor BG and take all meds. Long: Continue to control risk factors with heart healthy lifestyle.           ITP Comments:  ITP Comments    Row Name 03/19/20 0952 04/06/20 1719 04/14/20 1243 04/15/20 0515 05/13/20  0537   ITP Comments Virtual Orientation performed. Patient informed when to come in for RD and EP orientation. Diagnosis can be found in [facility] 03/04/2020. First full day of exercise!  Patient was oriented to gym and equipment including functions, settings, policies, and procedures.  Patient's individual exercise prescription and treatment plan were reviewed.  All starting workloads were established based on the results of the 6 minute walk test done at initial orientation visit.  The plan for exercise progression was also introduced and progression will be customized based on patient's performance and goals. Completed Initial RD Eval 30 Day review completed. Medical Director review done, changes made as directed,and approval shown by signature of Market researcher. 30 Day review completed. ITP review done, changes made as directed,and approval shown by signature of  Scientist, research (life sciences).   [place] Name 06/10/20 0533           ITP Comments 30 Day review completed. Medical Director ITP review done, changes made as directed, and signed approval by Medical Director.              Comments: 30 Day review completed. Medical Director ITP review done, changes made as directed, and signed approval by Medical Director.

## 2020-06-15 ENCOUNTER — Encounter: Payer: No Typology Code available for payment source | Admitting: *Deleted

## 2020-06-15 ENCOUNTER — Other Ambulatory Visit: Payer: Self-pay

## 2020-06-15 DIAGNOSIS — I214 Non-ST elevation (NSTEMI) myocardial infarction: Secondary | ICD-10-CM

## 2020-06-15 DIAGNOSIS — I252 Old myocardial infarction: Secondary | ICD-10-CM | POA: Diagnosis not present

## 2020-06-15 DIAGNOSIS — Z955 Presence of coronary angioplasty implant and graft: Secondary | ICD-10-CM

## 2020-06-15 NOTE — Progress Notes (Signed)
Daily Session Note  Patient Details  Name: John Hartman MRN: [id number] Date of Birth: Jun 21, 1971 Referring Provider:     Cardiac Rehab from 03/30/2020 in [facility] Cardiac and Pulmonary Rehab  Referring Provider Paraschos      Encounter Date: 06/15/2020  Check In:  Session Check In - 06/15/20 1701      Check-In   Supervising physician immediately available to respond to emergencies See telemetry face sheet for immediately available ER MD    Location [facility]-Cardiac & Pulmonary Rehab    Staff Present Renita Papa, RN BSN;Joseph [address] [place], [place], ACSM CEP, Exercise Physiologist    Virtual Visit No    Medication changes reported     No    Fall or balance concerns reported    No    Warm-up and Cool-down Performed on first and last piece of equipment    Resistance Training Performed Yes    VAD Patient? No    PAD/SET Patient? No      Pain Assessment   Currently in Pain? No/denies              Social History   Tobacco Use  Smoking Status Former Smoker  . Packs/day: 1.00  . Years: 10.00  . Pack years: 10.00  . Types: Cigarettes  . Quit date: 04/15/2003  . Years since quitting: 17.1  Smokeless Tobacco Never Used    Goals Met:  Independence with exercise equipment Exercise tolerated well No report of cardiac concerns or symptoms Strength training completed today  Goals Unmet:  Not Applicable  Comments: Pt able to follow exercise prescription today without complaint.  Will continue to monitor for progression.    Dr. Emily Filbert is Medical Director for [place] [name].

## 2020-06-17 ENCOUNTER — Emergency Department: Payer: No Typology Code available for payment source

## 2020-06-17 ENCOUNTER — Emergency Department
Admission: EM | Admit: 2020-06-17 | Discharge: 2020-06-17 | Disposition: A | Discharge status: Home / Self Care | Payer: No Typology Code available for payment source | Attending: Student | Admitting: Student

## 2020-06-17 ENCOUNTER — Encounter: Payer: Self-pay | Admitting: Emergency Medicine

## 2020-06-17 ENCOUNTER — Other Ambulatory Visit: Payer: Self-pay

## 2020-06-17 DIAGNOSIS — Z7982 Long term (current) use of aspirin: Secondary | ICD-10-CM | POA: Diagnosis not present

## 2020-06-17 DIAGNOSIS — Z87891 Personal history of nicotine dependence: Secondary | ICD-10-CM | POA: Diagnosis not present

## 2020-06-17 DIAGNOSIS — R0789 Other chest pain: Secondary | ICD-10-CM | POA: Insufficient documentation

## 2020-06-17 DIAGNOSIS — Z79899 Other long term (current) drug therapy: Secondary | ICD-10-CM | POA: Insufficient documentation

## 2020-06-17 DIAGNOSIS — R079 Chest pain, unspecified: Secondary | ICD-10-CM

## 2020-06-17 DIAGNOSIS — I110 Hypertensive heart disease with heart failure: Secondary | ICD-10-CM | POA: Insufficient documentation

## 2020-06-17 DIAGNOSIS — I5022 Chronic systolic (congestive) heart failure: Secondary | ICD-10-CM | POA: Diagnosis not present

## 2020-06-17 LAB — HEPATIC FUNCTION PANEL
ALT: 59 U/L — ABNORMAL HIGH (ref 0–44)
AST: 40 U/L (ref 15–41)
Albumin: 4.4 g/dL (ref 3.5–5.0)
Alkaline Phosphatase: 72 U/L (ref 38–126)
Bilirubin, Direct: 0.2 mg/dL (ref 0.0–0.2)
Indirect Bilirubin: 1.6 mg/dL — ABNORMAL HIGH (ref 0.3–0.9)
Total Bilirubin: 1.8 mg/dL — ABNORMAL HIGH (ref 0.3–1.2)
Total Protein: 7.6 g/dL (ref 6.5–8.1)

## 2020-06-17 LAB — TROPONIN I (HIGH SENSITIVITY)
Troponin I (High Sensitivity): 10 ng/L (ref <18)
Troponin I (High Sensitivity): 8 ng/L (ref <18)

## 2020-06-17 LAB — BASIC METABOLIC PANEL
Anion gap: 10 (ref 5–15)
BUN: 14 mg/dL (ref 6–20)
CO2: 26 mmol/L (ref 22–32)
Calcium: 9.4 mg/dL (ref 8.9–10.3)
Chloride: 102 mmol/L (ref 98–111)
Creatinine, Ser: 0.96 mg/dL (ref 0.61–1.24)
GFR calc Af Amer: >60 mL/min (ref >60)
GFR calc non Af Amer: >60 mL/min (ref >60)
Glucose, Bld: 163 mg/dL — ABNORMAL HIGH (ref 70–99)
Potassium: 4.2 mmol/L (ref 3.5–5.1)
Sodium: 138 mmol/L (ref 135–145)

## 2020-06-17 LAB — LIPASE, BLOOD: Lipase: 39 U/L (ref 11–51)

## 2020-06-17 LAB — CBC
HCT: 47.3 % (ref 39.0–52.0)
Hemoglobin: 15.4 g/dL (ref 13.0–17.0)
MCH: 26.9 pg (ref 26.0–34.0)
MCHC: 32.6 g/dL (ref 30.0–36.0)
MCV: 82.5 fL (ref 80.0–100.0)
Platelets: 221 10*3/uL (ref 150–400)
RBC: 5.73 MIL/uL (ref 4.22–5.81)
RDW: 13.2 % (ref 11.5–15.5)
WBC: 6.8 10*3/uL (ref 4.0–10.5)
nRBC: 0 % (ref 0.0–0.2)

## 2020-06-17 LAB — FIBRIN DERIVATIVES D-DIMER (ARMC ONLY): Fibrin derivatives D-dimer (ARMC): 359.24 ng/mL (FEU) (ref 0.00–499.00)

## 2020-06-17 MED ORDER — ASPIRIN 81 MG PO CHEW
324.0000 mg | CHEWABLE_TABLET | Freq: Once | ORAL | Status: AC
  Administered 2020-06-17: 324 mg via ORAL
  Filled 2020-06-17: qty 4

## 2020-06-17 NOTE — Discharge Instructions (Addendum)
Thank you for letting us take care of you in the emergency department today.   Please continue to take any regular, prescribed medications.   Please follow up with: Your primary care doctor and/or your cardiologist to review your ER visit and follow up on your symptoms.   Please return to the ER for any new or worsening symptoms.

## 2020-06-17 NOTE — ED Provider Notes (Signed)
[facility] Emergency Department Provider Note  ____________________________________________   First MD Initiated Contact with Patient 06/17/20 0809     (approximate)  I have reviewed the triage vital signs and the nursing notes.  History  Chief Complaint Chest Pain    HPI John Hartman is a 49 y.o. male past medical history as below, who presents to the emergency department for chest pain.  Patient states he first developed discomfort yesterday, describes a right-sided scapular, mid back discomfort.  Initially a soreness/sharp/shooting type sensation.  This then migrated today to the center and left side of his chest, and felt more like a heaviness. Moderate, radiated to the left arm, no alleviating/aggravating components.   Denies any shortness of breath, but did feel somewhat nauseous.  Felt like he could feel his heart occasionally skip a beat, and/or beat "strongly". does state that he recently went white water rafting, and therefore had an increase in his activity level and exertion.  No prolonged immobilization.  No leg swelling.  No hemoptysis.  With his cardiac history, he was concerned about his symptoms and therefore presented to the ER for further evaluation.  Took 81 mg ASA prior to arrival.   Past Medical Hx Past Medical History:  Diagnosis Date  . Angioedema   . Bronchitis   . DDD (degenerative disc disease), cervical   . Diabetes mellitus ([place])   . Fatty liver 03/31/2017   [place] April 2018  . GERD (gastroesophageal reflux disease)   . Gout   . Heart attack ([place])   . HTN (hypertension)   . Hypertension    CONTROLLED ON MEDS  . IBS (irritable bowel syndrome)   . Kidney stone   . LVH (left ventricular hypertrophy) 11/24/2018   Moderate, ECHO  . Myocardial infarction ([place]) 08/23/2018  . Seasonal allergies   . Spinal stenosis     Problem List Patient Active Problem List   Diagnosis Date Noted  . Chronic pain syndrome 04/30/2020  . Lumbar  radiculopathy 04/30/2020  . Spinal stenosis of lumbar region with neurogenic claudication 04/30/2020  . S/P cervical spinal fusion 04/30/2020  . HFrEF (heart failure with reduced ejection fraction) ([place]) 02/18/2020  . Nephrolithiasis 02/18/2020  . OSA (obstructive sleep apnea) 02/17/2020  . Right ureteral stone 02/07/2020  . Diabetes mellitus without complication ([place]) 62/22/9798  . Chronic systolic CHF (congestive heart failure) ([place]) 02/07/2020  . 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 Arthritis of right acromioclavicular joint 09/23/2019  . Incomplete tear of right rotator cuff 09/23/2019  . 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 H/O non-ST elevation myocardial infarction (NSTEMI) 11/24/2018  . LVH (left ventricular hypertrophy) 11/24/2018  . 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 History of angioedema 09/01/2018  . NSTEMI (non-ST elevated myocardial infarction) ([place]) 08/22/2018  . Chronic lower back pain 08/04/2017  . Degenerative disc disease, lumbar 08/04/2017  . Spondylosis of lumbar region without myelopathy or radiculopathy 08/04/2017  . Calcification of abdominal aorta (HCC) 08/04/2017  . Fatty liver 03/31/2017  . Hyperlipidemia LDL goal <70 01/04/2017  . Elevated serum glutamic pyruvic transaminase (SGPT) level 01/04/2017  . Coronary artery disease 08/25/2016  . Ketonuria 07/12/2016  . Glucosuria 07/12/2016  . 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 Family history of malignant neoplasm of gastrointestinal tract   . Benign neoplasm of rectosigmoid junction   . Essential hypertension 05/28/2015  . Gout 05/28/2015  . IBS (irritable bowel syndrome) 05/28/2015    Past Surgical Hx Past Surgical History:  Procedure Laterality Date  .  ACDF with fusion  2007  . CARDIAC CATHETERIZATION Left 08/15/2016   Procedure: Left Heart Cath and Coronary Angiography;  Surgeon: Wellington Hampshire, MD;  Location: [place] [facility];  Service: Cardiovascular;   Laterality: Left;  . COLONOSCOPY WITH PROPOFOL N/A 06/26/2015   Procedure: COLONOSCOPY WITH PROPOFOL;  Surgeon: Lucilla Lame, MD;  Location: [facility];  Service: Endoscopy;  Laterality: N/A;  with biopsies  . CORONARY/GRAFT ACUTE MI REVASCULARIZATION N/A 08/23/2018   Procedure: Coronary/Graft Acute MI Revascularization;  Surgeon: Isaias Cowman, MD;  Location: [place] [facility];  Service: Cardiovascular;  Laterality: N/A;  . CYSTOSCOPY W/ RETROGRADES  02/10/2020   Procedure: CYSTOSCOPY WITH RETROGRADE PYELOGRAM;  Surgeon: Billey Co, MD;  Location: [facility];  Service: Urology;;  . Consuela Mimes WITH BIOPSY N/A 11/22/2019   Procedure: CYSTOSCOPY WITH Bladder BIOPSY & Almyra Free;  Surgeon: Billey Co, MD;  Location: [facility];  Service: Urology;  Laterality: N/A;  . CYSTOSCOPY/URETEROSCOPY/HOLMIUM LASER  02/10/2020   Procedure: CYSTOSCOPY/URETEROSCOPY/HOLMIUM LASER;  Surgeon: Billey Co, MD;  Location: [facility];  Service: Urology;;  . CYSTOSCOPY/URETEROSCOPY/HOLMIUM LASER/STENT PLACEMENT Left 11/22/2019   Procedure: CYSTOSCOPY/URETEROSCOPY/BILATERAL RETROGRADE PYELOGRAM/HOLMIUM LASER/STENT PLACEMENT;  Surgeon: Billey Co, MD;  Location: [facility];  Service: Urology;  Laterality: Left;  . LEFT HEART CATH AND CORONARY ANGIOGRAPHY N/A 08/23/2018   Procedure: LEFT HEART CATH AND CORONARY ANGIOGRAPHY;  Surgeon: Isaias Cowman, MD;  Location: [place] [facility];  Service: Cardiovascular;  Laterality: N/A;  . LEFT HEART CATH AND CORONARY ANGIOGRAPHY N/A 02/10/2020   Procedure: LEFT HEART CATH AND CORONARY ANGIOGRAPHY;  Surgeon: Corey Skains, MD;  Location: [facility] [facility];  Service: Cardiovascular;  Laterality: N/A;  . NASAL SEPTUM SURGERY  2004  . SHOULDER ARTHROSCOPY W/ ROTATOR CUFF REPAIR  10/01/2019  . SHOULDER SURGERY Left 2007  . Testicular torsion  1980s  . VASECTOMY      Medications Prior to Admission medications   Medication Sig Start Date  End Date Taking? Authorizing Provider  ACCU-CHEK SOFTCLIX LANCETS lancets USE AS DIRECTED 07/23/16   Arnetha Courser, MD  allopurinol (ZYLOPRIM) 100 MG tablet Take 100 mg by mouth daily.     [provider]  amLODipine (NORVASC) 10 MG tablet Take 1 tablet (10 mg total) by mouth daily. 02/13/20   Ezekiel Slocumb, DO  aspirin EC 81 MG tablet Take 81 mg by mouth daily.    [provider]  atorvastatin (LIPITOR) 80 MG tablet Take 1 tablet (80 mg total) by mouth daily at 6 PM. Patient taking differently: Take 80 mg by mouth daily with supper.  08/25/18 11/19/20  Saundra Shelling, MD  BELBUCA 450 MCG FILM Place 1 Film inside cheek 2 (two) times daily. 07/17/19   Gillis Santa, MD  cetirizine (ZYRTEC) 10 MG tablet Take 10 mg by mouth at bedtime.    [provider]  Dapagliflozin-metFORMIN HCl ER (XIGDUO XR) 04-999 MG TB24 Take 2 tablets by mouth daily.     [provider]  diphenhydrAMINE (BENADRYL) 25 MG tablet Take 50 mg by mouth at bedtime.    [provider]  ezetimibe (ZETIA) 10 MG tablet Take 1 tablet (10 mg total) by mouth daily. 12/03/18   Arnetha Courser, MD  hydrALAZINE (APRESOLINE) 25 MG tablet Take 1 tablet (25 mg total) by mouth 2 (two) times daily. 08/25/18 11/19/20  Saundra Shelling, MD  isosorbide mononitrate (IMDUR) 30 MG 24 hr tablet Take 30 mg by mouth daily. 03/26/20   [provider]  Melatonin 5 MG TABS Take 10 mg by mouth at bedtime.     [provider]  Menthol, Topical Analgesic, (BIOFREEZE EX) Apply 1 application topically 3 (three) times daily as needed (muscle cramps/shoulder pain.).    [provider]  metoprolol tartrate (LOPRESSOR) 25 MG tablet Take 25 mg by mouth 2 (two) times daily. 08/22/19   [provider]  nitroGLYCERIN (NITROSTAT) 0.4 MG SL tablet Place 1 tablet (0.4 mg total) under the tongue every 5 (five) minutes as needed for chest pain. 08/25/18   Saundra Shelling, MD  [facility] VERIO test strip  USE 2 (TWO) TIMES DAILY USE AS INSTRUCTED. 09/16/19   [provider]  pregabalin (LYRICA) 50 MG capsule Take 1 capsule (50 mg total) by mouth 3 (three) times daily. 04/30/20   Gillis Santa, MD  RYBELSUS 3 MG TABS Take 3 mg by mouth daily at 6 (six) AM.     [provider]  tamsulosin (FLOMAX) 0.4 MG CAPS capsule Take 1 capsule (0.4 mg total) by mouth daily after supper. 02/12/20   Ezekiel Slocumb, DO  tiZANidine (ZANAFLEX) 4 MG tablet Take 1 tablet (4 mg total) by mouth every 8 (eight) hours as needed for muscle spasms. 04/30/20   Gillis Santa, MD  triamcinolone cream (KENALOG) 0.1 % Apply topically 3 (three) times daily. 02/12/20   Ezekiel Slocumb, DO  vitamin B-12 (CYANOCOBALAMIN) 1000 MCG tablet Take 1,000 mcg by mouth 3 (three) times a week.    [provider]    Allergies Imdur [isosorbide nitrate], Lisinopril, Other, Drug [tape], Tapentadol, and Ace inhibitors  Family Hx Family History  Problem Relation Age of Onset  . Breast cancer Mother   . Skin cancer Mother   . Arrhythmia Mother        A-fib  . Cancer Mother        breast, colon?, skin  . Hypertension Father   . Diabetes Father   . Cancer Maternal Grandfather   . Heart disease Brother        stent  . Allergies Son   . Alzheimer's disease Maternal Grandmother   . Stroke Neg Hx   . COPD Neg Hx     Social Hx Social History   Tobacco Use  . Smoking status: Former Smoker    Packs/day: 1.00    Years: 10.00    Pack years: 10.00    Types: Cigarettes    Quit date: 04/15/2003    Years since quitting: 17.1  . Smokeless tobacco: Never Used  Vaping Use  . Vaping Use: Never used  Substance Use Topics  . Alcohol use: No    Alcohol/week: 0.0 standard drinks  . Drug use: No     Review of Systems  Constitutional: Negative for fever. Negative for chills. Eyes: Negative for visual changes. ENT: Negative for sore throat. Cardiovascular: + for chest pain. Respiratory: Negative for shortness of  breath. Gastrointestinal: Negative for nausea. Negative for vomiting.  Genitourinary: Negative for dysuria. Musculoskeletal: Negative for leg swelling. Skin: Negative for rash. Neurological: Negative for headaches.   Physical Exam  Vital Signs: ED Triage Vitals [06/17/20 0715]  Enc Vitals Group     BP (!) 133/95     Pulse Rate 69     Resp 18     Temp 98.6 F (37 C)     Temp Source Oral     SpO2 97 %     Weight 208 lb (94.3 kg)     Height 5'  11" (1.803 m)     Head Circumference      Peak Flow      Pain Score 0     Pain Loc      Pain Edu?      Excl. in [place]?     Constitutional: Alert and oriented. Well appearing. NAD.  Head: Normocephalic. Atraumatic. Eyes: Conjunctivae clear. Sclera anicteric. Pupils equal and symmetric. Nose: No masses or lesions. No congestion or rhinorrhea. Mouth/Throat: Wearing mask.  Neck: No stridor. Trachea midline.  Cardiovascular: Normal rate, regular rhythm. Extremities well perfused. Respiratory: Normal respiratory effort.  Lungs CTAB. Gastrointestinal: Soft. Non-distended. Non-tender.  Genitourinary: Deferred. Musculoskeletal: No lower extremity edema. No deformities. Neurologic:  Normal speech and language. No gross focal or lateralizing neurologic deficits are appreciated.  Skin: Skin is warm, dry and intact. No rash noted.  No vesicles or blisters. Psychiatric: Mood and affect are appropriate for situation.  EKG  Personally reviewed and interpreted by myself.   Date: 06/17/20 Time: 0710 Rate: 61 Rhythm: sinus Axis: left Intervals: WNL TWI III - seen prior No STEMI    Radiology  Personally reviewed available imaging myself.   CXR - IMPRESSION:  No acute abnormalities.    Procedures  Procedure(s) performed (including critical care):  Procedures   Initial Impression / Assessment and Plan / MDM / ED Course  49 y.o. male who presents to the ED for chest pain, palpitations  Ddx: atypical ACS, musculoskeletal,  pulmonary infection, PE.  No evidence of trauma on exam.  No evidence of shingles on exam.  He has equal and symmetric distal pulses and no focal neurological deficits on exam, normotensive, therefore do not suspect dissection  Will plan for labs, imaging, ASA  EKG without acute ischemia or arrhythmia.  Dimer negative.  CXR negative.  Troponin and delta troponin both negative.  Remainder of blood work without actionable derangements. Suspect symptoms may be more so MSK in etiology given his recent rafting. As such, feel he is stable for discharge w/ supportive care and outpatient follow up.  Patient and wife at bedside are in agreement.  Advised outpatient follow-up and given return precautions.   _______________________________   As part of my medical decision making I have reviewed available labs, radiology tests, reviewed old records/performed chart review.    Final Clinical Impression(s) / ED Diagnosis  Final diagnoses:  Chest pain in adult       Note:  This document was prepared using Dragon voice recognition software and may include unintentional dictation errors.   Lilia Pro., MD 06/17/20 1310

## 2020-06-17 NOTE — ED Notes (Signed)
Called lab and informed that I spoke with Marissa earlier at 0854 about the red top sent for the troponin. They jsut found tube and will run now. Will inform MD

## 2020-06-17 NOTE — ED Triage Notes (Signed)
Here for chest pain starting last night.  intermittent pain in left upper chest radiating to left arm. Hx of MI.  + nausea

## 2020-06-17 NOTE — ED Notes (Signed)
John Hartman in lab to add trop to red that was just sent down for 2nd trop

## 2020-06-24 ENCOUNTER — Encounter: Payer: No Typology Code available for payment source | Admitting: *Deleted

## 2020-06-24 ENCOUNTER — Other Ambulatory Visit: Payer: Self-pay

## 2020-06-24 DIAGNOSIS — I214 Non-ST elevation (NSTEMI) myocardial infarction: Secondary | ICD-10-CM

## 2020-06-24 DIAGNOSIS — Z955 Presence of coronary angioplasty implant and graft: Secondary | ICD-10-CM

## 2020-06-24 DIAGNOSIS — I252 Old myocardial infarction: Secondary | ICD-10-CM | POA: Diagnosis not present

## 2020-06-24 NOTE — Progress Notes (Signed)
Daily Session Note  Patient Details  Name: John Hartman MRN: [id number] Date of Birth: 03/10/1971 Referring Provider:     Cardiac Rehab from 03/30/2020 in [facility]  Referring Provider Paraschos      Encounter Date: 06/24/2020  Check In:  Session Check In - 06/24/20 1707      Check-In   Supervising physician immediately available to respond to emergencies See telemetry face sheet for immediately available ER MD    Location [facility]    Staff Present Renita Papa, RN Vickki Hearing, BA, ACSM CEP, Exercise Physiologist;Laureen Owens Shark, BS, RRT, CPFT    Virtual Visit No    Medication changes reported     No    Fall or balance concerns reported    No    Warm-up and Cool-down Performed on first and last piece of equipment    Resistance Training Performed Yes    VAD Patient? No    PAD/SET Patient? No      Pain Assessment   Currently in Pain? No/denies              Social History   Tobacco Use  Smoking Status Former Smoker  . Packs/day: 1.00  . Years: 10.00  . Pack years: 10.00  . Types: Cigarettes  . Quit date: 04/15/2003  . Years since quitting: 17.2  Smokeless Tobacco Never Used    Goals Met:  Independence with exercise equipment Exercise tolerated well No report of cardiac concerns or symptoms Strength training completed today  Goals Unmet:  Not Applicable  Comments: Pt able to follow exercise prescription today without complaint.  Will continue to monitor for progression.    Dr. Emily Filbert is Medical Director for [place] and [name].

## 2020-06-25 ENCOUNTER — Other Ambulatory Visit: Payer: Self-pay

## 2020-06-25 ENCOUNTER — Encounter: Payer: No Typology Code available for payment source | Attending: Cardiology | Admitting: *Deleted

## 2020-06-25 DIAGNOSIS — I214 Non-ST elevation (NSTEMI) myocardial infarction: Secondary | ICD-10-CM | POA: Diagnosis not present

## 2020-06-25 DIAGNOSIS — Z955 Presence of coronary angioplasty implant and graft: Secondary | ICD-10-CM

## 2020-06-25 NOTE — Progress Notes (Signed)
Daily Session Note  Patient Details  Name: Avraj Lindroth MRN: [id number] Date of Birth: [birth year] Referring Provider:     Cardiac Rehab from 03/30/2020 in [facility] Card[facility] and [facility] Rehab  Referring Provider Paraschos      Encounter Date: 06/25/2020  Check In:  Session Check In - 06/25/20 1708      Check-In   Supervising physician immediately available to respond to emergencies See telemetry face sheet for immediately available ER MD    Location [facility]    Staff Present Renita Papa, RN BSN;Joseph Foy [place], [place], ACSM CEP, Exercise Physiologist    Virtual Visit No    Medication changes reported     No    Fall or balance concerns reported    No    Warm-up and Cool-down Performed on first and last piece of equipment    Resistance Training Performed Yes    VAD Patient? No    PAD/SET Patient? No      Pain Assessment   Currently in Pain? No/denies              Social History   Tobacco Use  Smoking Status Former Smoker  . Packs/day: 1.00  . Years: 10.00  . Pack years: 10.00  . Types: Cigarettes  . Quit date: 04/15/2003  . Years since quitting: 17.2  Smokeless Tobacco Never Used    Goals Met:  Independence with exercise equipment Exercise tolerated well No report of cardiac concerns or symptoms Strength training completed today  Goals Unmet:  Not Applicable  Comments: Pt able to follow exercise prescription today without complaint.  Will continue to monitor for progression.    Dr. Emily Filbert is Medical Director for [place] and [name].

## 2020-07-01 ENCOUNTER — Other Ambulatory Visit: Payer: Self-pay

## 2020-07-01 ENCOUNTER — Encounter: Payer: No Typology Code available for payment source | Admitting: *Deleted

## 2020-07-01 DIAGNOSIS — I214 Non-ST elevation (NSTEMI) myocardial infarction: Secondary | ICD-10-CM | POA: Diagnosis not present

## 2020-07-01 DIAGNOSIS — Z955 Presence of coronary angioplasty implant and graft: Secondary | ICD-10-CM

## 2020-07-01 NOTE — Progress Notes (Signed)
Daily Session Note  Patient Details  Name: Steve Winski MRN: [id number] Date of Birth: 03/24/1971 Referring Provider:     Cardiac Rehab from 03/30/2020 in [facility]  Referring Provider Paraschos      Encounter Date: 07/01/2020  Check In:  Session Check In - 07/01/20 1721      Check-In   Supervising physician immediately available to respond to emergencies See telemetry face sheet for immediately available ER MD    Location [facility]    Staff Present  , RN BSN;Joseph Hood RCP,RRT,BSRT;Kara Langdon, MS Exercise Physiologist;Amanda Sommer, BA, ACSM CEP, Exercise Physiologist    Virtual Visit No    Medication changes reported     No    Fall or balance concerns reported    No    Warm-up and Cool-down Performed on first and last piece of equipment    Resistance Training Performed Yes    VAD Patient? No    PAD/SET Patient? No      Pain Assessment   Currently in Pain? No/denies              Social History   Tobacco Use  Smoking Status Former Smoker  . Packs/day: 1.00  . Years: 10.00  . Pack years: 10.00  . Types: Cigarettes  . Quit date: 04/15/2003  . Years since quitting: 17.2  Smokeless Tobacco Never Used    Goals Met:  Independence with exercise equipment Exercise tolerated well No report of cardiac concerns or symptoms Strength training completed today  Goals Unmet:  Not Applicable  Comments: Pt able to follow exercise prescription today without complaint.  Will continue to monitor for progression.    Dr. Mark Miller is Medical Director for [facility] and [name]. 

## 2020-07-02 ENCOUNTER — Encounter: Payer: No Typology Code available for payment source | Admitting: *Deleted

## 2020-07-02 DIAGNOSIS — Z955 Presence of coronary angioplasty implant and graft: Secondary | ICD-10-CM

## 2020-07-02 DIAGNOSIS — I214 Non-ST elevation (NSTEMI) myocardial infarction: Secondary | ICD-10-CM | POA: Diagnosis not present

## 2020-07-02 NOTE — Progress Notes (Signed)
Daily Session Note  Patient Details  Name: Marcellas Marchant MRN: [id number] Date of Birth: September 29, 1971 Referring Provider:     Cardiac Rehab from 03/30/2020 in [facility] Cardiac and Pul[facility] Rehab  Referring Provider Paraschos      Encounter Date: 07/02/2020  Check In:  Session Check In - 07/02/20 1717      Check-In   Supervising physician immediately available to respond to emergencies See telemetry face sheet for immediately available ER MD    Location [facility]    Staff Present Renita Papa, RN Vickki Hearing, BA, ACSM CEP, Exercise Physiologist;Kelly Amedeo Plenty, BS, ACSM CEP, Exercise Physiologist    Virtual Visit No    Medication changes reported     No    Fall or balance concerns reported    No    Warm-up and Cool-down Performed on first and last piece of equipment    Resistance Training Performed Yes    VAD Patient? No    PAD/SET Patient? No      Pain Assessment   Currently in Pain? No/denies              Social History   Tobacco Use  Smoking Status Former Smoker  . Packs/day: 1.00  . Years: 10.00  . Pack years: 10.00  . Types: Cigarettes  . Quit date: 04/15/2003  . Years since quitting: 17.2  Smokeless Tobacco Never Used    Goals Met:  Independence with exercise equipment Exercise tolerated well No report of cardiac concerns or symptoms Strength training completed today  Goals Unmet:  Not Applicable  Comments: Pt able to follow exercise prescription today without complaint.  Will continue to monitor for progression.    Dr. Emily Filbert is Medical Director for [place] and [name].

## 2020-07-06 ENCOUNTER — Other Ambulatory Visit: Payer: Self-pay

## 2020-07-06 ENCOUNTER — Encounter: Payer: No Typology Code available for payment source | Admitting: *Deleted

## 2020-07-06 VITALS — Ht 71.25 in | Wt 208.9 lb

## 2020-07-06 DIAGNOSIS — I214 Non-ST elevation (NSTEMI) myocardial infarction: Secondary | ICD-10-CM

## 2020-07-06 DIAGNOSIS — Z955 Presence of coronary angioplasty implant and graft: Secondary | ICD-10-CM

## 2020-07-06 NOTE — Progress Notes (Signed)
Daily Session Note  Patient Details  Name: Mattias Walmsley MRN: [id number] Date of Birth: 1971/05/16 Referring Provider:     Cardiac Rehab from 03/30/2020 in [facility] Rehab  Referring Provider Paraschos      Encounter Date: 07/06/2020  Check In:  Session Check In - 07/06/20 1720      Check-In   Supervising physician immediately available to respond to emergencies See telemetry face sheet for immediately available ER MD    Location [facility]    Staff Present Renita Papa, RN Margurite Auerbach, MS Exercise Physiologist;Amanda Oletta Darter, BA, ACSM CEP, Exercise Physiologist;Kelly Amedeo Plenty, BS, ACSM CEP, Exercise Physiologist    Virtual Visit No    Medication changes reported     No    Fall or balance concerns reported    No    Warm-up and Cool-down Performed on first and last piece of equipment    Resistance Training Performed Yes    VAD Patient? No    PAD/SET Patient? No      Pain Assessment   Currently in Pain? No/denies              Social History   Tobacco Use  Smoking Status Former Smoker  . Packs/day: 1.00  . Years: 10.00  . Pack years: 10.00  . Types: Cigarettes  . Quit date: 04/15/2003  . Years since quitting: 17.2  Smokeless Tobacco Never Used    Goals Met:  Independence with exercise equipment Exercise tolerated well No report of cardiac concerns or symptoms Strength training completed today  Goals Unmet:  Not Applicable  Comments: Pt able to follow exercise prescription today without complaint.  Will continue to monitor for progression.  Howe Name 03/30/20 1619 07/06/20 1752       6 Minute Walk   Phase Initial Discharge    Distance 1905 feet 2025 feet    Distance % Change -- 6.3 %    Distance Feet Change -- 120 ft    Walk Time 6 minutes 6 minutes    # of Rest Breaks 0 0    MPH 3.6 3.84    METS 4.25 5.89    RPE 12 13    Perceived Dyspnea  1 --    VO2 Peak 14.9 20.63    Symptoms No No     Resting HR 93 bpm 95 bpm    Resting BP 114/76 108/70    Resting Oxygen Saturation  97 % --    Exercise Oxygen Saturation  during 6 min walk 97 % --    Max Ex. HR 129 bpm 142 bpm    Max Ex. BP 128/74 140/80    2 Minute Post BP 120/68 --              Dr. Emily Filbert is Medical Director for [place] and [name].

## 2020-07-08 ENCOUNTER — Encounter: Payer: Self-pay | Admitting: *Deleted

## 2020-07-08 ENCOUNTER — Other Ambulatory Visit: Payer: Self-pay

## 2020-07-08 ENCOUNTER — Encounter: Payer: No Typology Code available for payment source | Admitting: *Deleted

## 2020-07-08 DIAGNOSIS — I214 Non-ST elevation (NSTEMI) myocardial infarction: Secondary | ICD-10-CM | POA: Diagnosis not present

## 2020-07-08 DIAGNOSIS — Z955 Presence of coronary angioplasty implant and graft: Secondary | ICD-10-CM

## 2020-07-08 NOTE — Progress Notes (Signed)
Cardiac Individual Treatment Plan  Patient Details  Name: John Hartman MRN: [id number] Date of Birth: [birth year] Referring Provider:     Cardiac Rehab from 03/30/2020 in [facility] Cardiac and Pulmonary Rehab  Referring Provider Paraschos      Initial Encounter Date:    Cardiac Rehab from 03/30/2020 in [facility] Cardiac and Pulmonary Rehab  Date 03/30/20      Visit Diagnosis: NSTEMI (non-ST elevated myocardial infarction) [facility])  Status post coronary artery stent placement  Patient's Home Medications on Admission:  Current Outpatient Medications:  .  allopurinol (ZYLOPRIM) 100 MG tablet, Take 100 mg by mouth daily. , Disp: , Rfl:  .  amLODipine (NORVASC) 10 MG tablet, Take 1 tablet (10 mg total) by mouth daily., Disp: 30 tablet, Rfl: 1 .  aspirin EC 81 MG tablet, Take 81 mg by mouth daily., Disp: , Rfl:  .  atorvastatin (LIPITOR) 80 MG tablet, Take 1 tablet (80 mg total) by mouth daily at 6 PM. (Patient taking differently: Take 80 mg by mouth daily with supper. ), Disp: 30 tablet, Rfl: 0 .  cetirizine (ZYRTEC) 10 MG tablet, Take 10 mg by mouth at bedtime., Disp: , Rfl:  .  Dapagliflozin-metFORMIN HCl ER (XIGDUO XR) 04-999 MG TB24, Take 1 tablet by mouth in the morning and at bedtime. , Disp: , Rfl:  .  diphenhydrAMINE (BENADRYL) 25 MG tablet, Take 50 mg by mouth at bedtime., Disp: , Rfl:  .  ezetimibe (ZETIA) 10 MG tablet, Take 1 tablet (10 mg total) by mouth daily., Disp: 30 tablet, Rfl: 11 .  hydrALAZINE (APRESOLINE) 25 MG tablet, Take 1 tablet (25 mg total) by mouth 2 (two) times daily., Disp: 60 tablet, Rfl: 0 .  Menthol, Topical Analgesic, (BIOFREEZE EX), Apply 1 application topically 3 (three) times daily as needed (muscle cramps/shoulder pain.)., Disp: , Rfl:  .  metoprolol tartrate (LOPRESSOR) 25 MG tablet, Take 25 mg by mouth 2 (two) times daily., Disp: , Rfl:  .  nitroGLYCERIN (NITROSTAT) 0.4 MG SL tablet, Place 1 tablet (0.4 mg total) under the tongue every 5 (five) minutes as needed for  chest pain., Disp: 30 tablet, Rfl: 0 .  pregabalin (LYRICA) 50 MG capsule, Take 1 capsule (50 mg total) by mouth 3 (three) times daily. (Patient taking differently: Take 50 mg by mouth 2 (two) times daily. 1 capsule in the morning and 2 capsules at night.), Disp: 90 capsule, Rfl: 5 .  RYBELSUS 3 MG TABS, Take 3 mg by mouth daily at 6 (six) AM. , Disp: , Rfl:  .  tiZANidine (ZANAFLEX) 4 MG tablet, Take 1 tablet (4 mg total) by mouth every 8 (eight) hours as needed for muscle spasms., Disp: 90 tablet, Rfl: 5 .  vitamin B-12 (CYANOCOBALAMIN) 1000 MCG tablet, Take 1,000 mcg by mouth daily. , Disp: , Rfl:   Past Medical History: Past Medical History:  Diagnosis Date  . Angioedema   . Bronchitis   . DDD (degenerative disc disease), cervical   . Diabetes mellitus ([place])   . Fatty liver 03/31/2017   [place] April 2018  . GERD (gastroesophageal reflux disease)   . Gout   . Heart attack ([place])   . HTN (hypertension)   . Hypertension    CONTROLLED ON MEDS  . IBS (irritable bowel syndrome)   . Kidney stone   . LVH (left ventricular hypertrophy) 11/24/2018   Moderate, ECHO  . Myocardial infarction ([place]) 08/23/2018  . Seasonal allergies   . Spinal stenosis     Tobacco Use:  Social History   Tobacco Use  Smoking Status Former Smoker  . Packs/day: 1.00  . Years: 10.00  . Pack years: 10.00  . Types: Cigarettes  . Quit date: 04/15/2003  . Years since quitting: 17.2  Smokeless Tobacco Never Used    Labs: Recent Chemical engineer    Labs for ITP Cardiac and Pulmonary Rehab Latest Ref Rng & Units 02/14/2019 03/28/2019 10/18/2019 01/06/2020 02/08/2020   Cholestrol 0 - 200 mg/dL 110 103 - 135 145   LDLCALC 0 - 99 mg/dL 59 58 - 78 95   HDL >40 mg/dL 32(L) 25(L) - 33(L) 33(L)   Trlycerides <150 mg/dL 108 116 - 140 84   Hemoglobin A1c 4.8 - 5.6 % 6.1(H) - 7.2 - 6.4(H)       Exercise Target Goals: Exercise Program Goal: Individual exercise prescription set using results from initial 6 min walk  test and THRR while considering  patient's activity barriers and safety.   Exercise Prescription Goal: Initial exercise prescription builds to 30-45 minutes a day of aerobic activity, 2-3 days per week.  Home exercise guidelines will be given to patient during program as part of exercise prescription that the participant will acknowledge.   Education: Aerobic Exercise & Resistance Training: - Gives group verbal and written instruction on the various components of exercise. Focuses on aerobic and resistive training programs and the benefits of this training and how to safely progress through these programs..   Cardiac Rehab from 10/31/2018 in [facility] Cardiac and Pulmonary Rehab  Date 09/24/18  Educator [facility]  Instruction Review Code 1- [employer] Understanding      Education: Exercise & Equipment Safety: - Individual verbal instruction and demonstration of equipment use and safety with use of the equipment.   Cardiac Rehab from 03/30/2020 in [facility] Cardiac and Pulmonary Rehab  Date 03/30/20  Educator AS  Instruction Review Code 1- Verbalizes Understanding      Education: Exercise Physiology & General Exercise Guidelines: - Group verbal and written instruction with models to review the exercise physiology of the cardiovascular system and associated critical values. Provides general exercise guidelines with specific guidelines to those with heart or lung disease.    Cardiac Rehab from 10/31/2018 in [facility] Cardiac and Pulmonary Rehab  Date 09/19/18  Educator [facility]  Instruction Review Code 1- Verbalizes Understanding      Education: Flexibility, Balance, Mind/Body Relaxation: Provides group verbal/written instruction on the benefits of flexibility and balance training, including mind/body exercise modes such as yoga, pilates and tai chi.  Demonstration and skill practice provided.   Cardiac Rehab from 10/31/2018 in [facility] Cardiac and Pulmonary Rehab  Date 10/01/18  Educator AS  Instruction Review Code  1- Verbalizes Understanding      Activity Barriers & Risk Stratification:   6 Minute Walk:  6 Minute Walk    Row Name 03/30/20 1619 07/06/20 1752       6 Minute Walk   Phase Initial Discharge    Distance 1905 feet 2025 feet    Distance % Change -- 6.3 %    Distance Feet Change -- 120 ft    Walk Time 6 minutes 6 minutes    # of Rest Breaks 0 0    MPH 3.6 3.84    METS 4.25 5.89    RPE 12 13    Perceived Dyspnea  1 --    VO2 Peak 14.9 20.63    Symptoms No No    Resting HR 93 bpm 95 bpm    Resting  BP 114/76 108/70    Resting Oxygen Saturation  97 % --    Exercise Oxygen Saturation  during 6 min walk 97 % --    Max Ex. HR 129 bpm 142 bpm    Max Ex. BP 128/74 140/80    2 Minute Post BP 120/68 --           Oxygen Initial Assessment:   Oxygen Re-Evaluation:   Oxygen Discharge (Final Oxygen Re-Evaluation):   Initial Exercise Prescription:  Initial Exercise Prescription - 03/30/20 1600      Date of Initial Exercise RX and Referring Provider   Date 03/30/20    Referring Provider Paraschos      Treadmill   MPH 3.6    Grade 1    Minutes 15    METs 4.25      Elliptical   Level 1    Speed 3    Minutes 15    METs 4      REL-XR   Level 4    Speed 50    Minutes 15    METs 4      Prescription Details   Frequency (times per week) 3    Duration Progress to 30 minutes of continuous aerobic without signs/symptoms of physical distress      Intensity   THRR 40-80% of Max Heartrate 125-157    Ratings of Perceived Exertion 11-15    Perceived Dyspnea 0-4      Resistance Training   Training Prescription Yes    Weight 3 lb    Reps 10-15           Perform Capillary Blood Glucose checks as needed.  Exercise Prescription Changes:  Exercise Prescription Changes    Row Name 03/30/20 1600 04/15/20 1100 04/16/20 1700 04/28/20 1400 05/11/20 1400     Response to Exercise   Blood Pressure (Admit) 114/76 122/78 -- 100/60 130/80   Blood Pressure (Exercise)  128/74 162/80 -- 160/80 168/86   Blood Pressure (Exit) 120/68 118/68 -- 108/64 128/72   Heart Rate (Admit) 93 bpm 81 bpm -- 95 bpm 95 bpm   Heart Rate (Exercise) 125 bpm 165 bpm -- 156 bpm 168 bpm   Heart Rate (Exit) 95 bpm 126 bpm -- 93 bpm 136 bpm   Rating of Perceived Exertion (Exercise) 12 14 -- 15 15   Perceived Dyspnea (Exercise) 1 -- -- -- --   Symptoms none none -- none back pain elliptical   Duration -- Continue with 30 min of aerobic exercise without signs/symptoms of physical distress. Continue with 30 min of aerobic exercise without signs/symptoms of physical distress. Continue with 30 min of aerobic exercise without signs/symptoms of physical distress. Continue with 30 min of aerobic exercise without signs/symptoms of physical distress.   Intensity -- THRR unchanged THRR unchanged THRR unchanged THRR unchanged     Progression   Progression -- Continue to progress workloads to maintain intensity without signs/symptoms of physical distress. Continue to progress workloads to maintain intensity without signs/symptoms of physical distress. Continue to progress workloads to maintain intensity without signs/symptoms of physical distress. Continue to progress workloads to maintain intensity without signs/symptoms of physical distress.   Average METs -- 4.57 4.57 6.5 3.75     Resistance Training   Training Prescription -- Yes Yes Yes Yes   Weight -- 5 lb 5 lb 7 lb 7 lb   Reps -- 10-15 10-15 10-15 10-15     Interval Training   Interval Training -- No No No  No     Treadmill   MPH -- 3.6 3.6 3.6 3.6   Grade -- _0 1.5   Minutes -- _1 METs -- 4.25 4.25 4.25 4.5     Recumbant Elliptical   Level -- -- -- -- 2   Minutes -- -- -- -- 15   METs -- -- -- -- 3     Elliptical   Level -- 1 1 -- 1   Speed -- 3.7 3.7 -- 3   Minutes -- 15 15 -- 15     REL-XR   Level -- _2 --   Speed -- -- -- 50 --   Minutes -- _3 --   METs -- 4.9 4.9 8.8 --     Home Exercise Plan    Plans to continue exercise at -- -- Home (comment)  walking in neighborhood and TM at home on off days of rehab -- Home (comment)  walking in neighborhood and TM at home on off days of rehab   Frequency -- -- Add 2 additional days to program exercise sessions. -- Add 2 additional days to program exercise sessions.   Initial Home Exercises Provided -- -- 04/16/20 -- 04/16/20   Row Name 05/26/20 1500 06/10/20 1400 06/25/20 0900 07/06/20 1300       Response to Exercise   Blood Pressure (Admit) 120/62 122/80 104/68 100/70    Blood Pressure (Exercise) 158/80 158/70 130/70 146/70    Blood Pressure (Exit) 118/62 120/70 118/64 108/68    Heart Rate (Admit) 88 bpm 99 bpm 100 bpm 54 bpm    Heart Rate (Exercise) 146 bpm 165 bpm 158 bpm 126 bpm    Heart Rate (Exit) 127 bpm 126 bpm 102 bpm 112 bpm    Rating of Perceived Exertion (Exercise) _4 Symptoms -- none none none    Duration Continue with 30 min of aerobic exercise without signs/symptoms of physical distress. Continue with 30 min of aerobic exercise without signs/symptoms of physical distress. Continue with 30 min of aerobic exercise without signs/symptoms of physical distress. Continue with 30 min of aerobic exercise without signs/symptoms of physical distress.    Intensity THRR unchanged THRR unchanged THRR unchanged THRR unchanged      Progression   Progression Continue to progress workloads to maintain intensity without signs/symptoms of physical distress. Continue to progress workloads to maintain intensity without signs/symptoms of physical distress. Continue to progress workloads to maintain intensity without signs/symptoms of physical distress. Continue to progress workloads to maintain intensity without signs/symptoms of physical distress.    Average METs 6.9 6.52 7.2 7.19      Resistance Training   Training Prescription Yes Yes Yes Yes    Weight 7 lb 7 lb 7 lb 7 lb    Reps 10-15 10-15 10-15 10-15      Interval Training    Interval Training No No No No      Treadmill   MPH 3.6 3.6 3.6 3.7    Grade _5 Minutes _6 METs 4.25 4.25 4.75 5.36      Recumbant Elliptical   Level -- -- -- 6    Minutes -- -- -- 15    METs -- -- -- 5.5      REL-XR   Level _7 Speed 50 -- 50 --    Minutes  _0 METs 9.6 8.8 9.6 10.7      Home Exercise Plan   Plans to continue exercise at Home (comment)  walking in neighborhood and TM at home on off days of rehab Home (comment)  walking in neighborhood and TM at home on off days of rehab Home (comment)  walking in neighborhood and TM at home on off days of rehab Home (comment)  walking in neighborhood and TM at home on off days of rehab    Frequency Add 2 additional days to program exercise sessions. Add 2 additional days to program exercise sessions. Add 2 additional days to program exercise sessions. Add 2 additional days to program exercise sessions.    Initial Home Exercises Provided 04/16/20 04/16/20 04/16/20 04/16/20           Exercise Comments:   Exercise Goals and Review:  Exercise Goals    Row Name 03/30/20 1626             Exercise Goals   Increase Physical Activity Yes       Intervention Provide advice, education, support and counseling about physical activity/exercise needs.;Develop an individualized exercise prescription for aerobic and resistive training based on initial evaluation findings, risk stratification, comorbidities and participant's personal goals.       Expected Outcomes Short Term: Attend rehab on a regular basis to increase amount of physical activity.;Long Term: Add in home exercise to make exercise part of routine and to increase amount of physical activity.;Long Term: Exercising regularly at least 3-5 days a week.       Increase Strength and Stamina Yes       Intervention Provide advice, education, support and counseling about physical activity/exercise needs.;Develop an individualized exercise  prescription for aerobic and resistive training based on initial evaluation findings, risk stratification, comorbidities and participant's personal goals.       Expected Outcomes Short Term: Increase workloads from initial exercise prescription for resistance, speed, and METs.;Short Term: Perform resistance training exercises routinely during rehab and add in resistance training at home;Long Term: Improve cardiorespiratory fitness, muscular endurance and strength as measured by increased METs and functional capacity (6MWT)       Able to understand and use rate of perceived exertion (RPE) scale Yes       Intervention Provide education and explanation on how to use RPE scale       Expected Outcomes Short Term: Able to use RPE daily in rehab to express subjective intensity level;Long Term:  Able to use RPE to guide intensity level when exercising independently       Able to understand and use Dyspnea scale Yes       Intervention Provide education and explanation on how to use Dyspnea scale       Expected Outcomes Short Term: Able to use Dyspnea scale daily in rehab to express subjective sense of shortness of breath during exertion;Long Term: Able to use Dyspnea scale to guide intensity level when exercising independently       Knowledge and understanding of Target Heart Rate Range (THRR) Yes       Intervention Provide education and explanation of THRR including how the numbers were predicted and where they are located for reference       Expected Outcomes Short Term: Able to state/look up THRR;Short Term: Able to use daily as guideline for intensity in rehab;Long Term: Able to use THRR to govern intensity when exercising independently       Able  to check pulse independently Yes       Intervention Provide education and demonstration on how to check pulse in carotid and radial arteries.;Review the importance of being able to check your own pulse for safety during independent exercise       Expected Outcomes  Short Term: Able to explain why pulse checking is important during independent exercise;Long Term: Able to check pulse independently and accurately       Understanding of Exercise Prescription Yes       Intervention Provide education, explanation, and written materials on patient's individual exercise prescription       Expected Outcomes Short Term: Able to explain program exercise prescription;Long Term: Able to explain home exercise prescription to exercise independently              Exercise Goals Re-Evaluation :  Exercise Goals Re-Evaluation    Row Name 04/06/20 1721 04/15/20 1108 04/16/20 1715 04/28/20 1444 04/30/20 1711     Exercise Goal Re-Evaluation   Exercise Goals Review Increase Physical Activity;Able to understand and use rate of perceived exertion (RPE) scale;Knowledge and understanding of Target Heart Rate Range (THRR);Understanding of Exercise Prescription;Increase Strength and Stamina;Able to check pulse independently Increase Physical Activity;Increase Strength and Stamina;Understanding of Exercise Prescription Increase Physical Activity;Increase Strength and Stamina;Understanding of Exercise Prescription;Able to understand and use rate of perceived exertion (RPE) scale;Knowledge and understanding of Target Heart Rate Range (THRR);Able to check pulse independently Increase Physical Activity;Increase Strength and Stamina;Able to understand and use rate of perceived exertion (RPE) scale;Able to understand and use Dyspnea scale;Knowledge and understanding of Target Heart Rate Range (THRR);Able to check pulse independently;Understanding of Exercise Prescription Increase Physical Activity;Increase Strength and Stamina;Able to understand and use rate of perceived exertion (RPE) scale;Able to understand and use Dyspnea scale;Knowledge and understanding of Target Heart Rate Range (THRR);Able to check pulse independently;Understanding of Exercise Prescription   Comments Reviewed RPE scale, THR  and program prescription with pt today.  Pt voiced understanding and was given a copy of goals to take home. [name] is off to a good start in rehab.  He is already doing well on the elliptical and up to level 6 on the XR.  We will continue to monitor his progress. Reviewed home exercise with pt today.  Pt plans to walk on TM and in neighborhood for exercise.  Reviewed THR, pulse, RPE, sign and symptoms, NTG use, and when to call 911 or MD.  Also discussed weather considerations and indoor options.  Pt voiced understanding. [name] is progressing well.  He works in correct HR and RPE ranges.  He has moved up to 7 lb for strength work. [name] has shown increases in workloads during rehab sessions. He has increased his elevation to 1.5% on the TM. The recumbant elliptical is tolerated better then the upright ellipticial due to back pain. He continues to make progress and increase in strength and stamina. [name] also reports walking regularly at home.   Expected Outcomes Short: Use RPE daily to regulate intensity. Long: Follow program prescription in THR. Short: Continue to improve workloads  Long: Continue to follow program prescription Short: Add 1-2 days of exercise at home on off days of cardiac rehab. Long: Become independent with exercise routine. Short:  continue to follow program prescription Long: increase overall MET level Short: Increase elevation on TM to 2% Long: Continue with independent exercise routine and increase strength and stamina.   [place] Name 05/11/20 1447 05/26/20 1601 06/10/20 1452 06/15/20 1708 06/25/20 7209  Exercise Goal Re-Evaluation   Exercise Goals Review Increase Physical Activity;Increase Strength and Stamina;Understanding of Exercise Prescription Increase Physical Activity;Increase Strength and Stamina;Understanding of Exercise Prescription Increase Physical Activity;Increase Strength and Stamina;Understanding of Exercise Prescription Increase Physical Activity;Increase Strength and  Stamina;Understanding of Exercise Prescription Increase Physical Activity;Increase Strength and Stamina;Understanding of Exercise Prescription   Comments [name] continues to do well in rehab.  He is enjoying being back in class again.  We have rotated him off the upright elliptical with his back.  We will continue to encourage him to increase elevation on treadmill and workload on recumbent elliptical.  We will continue to monitor his progress. [name] is progressing well and is at level 8 on the XR.  He attends consistently and works in [employer] range. [name] is doing well in rehab.  He is up to level 9 on the XR now.  We will continue to encourage good attendance and monitor his progress. [name] just returned from vacation and was active the whole time doing more than 10k steps per day.  He has a TM at home as well. [name] continues to progress workloads and METS.  He works in Futures trader and RPE range.  Staff will monitor progress.   Expected Outcomes Short: Increase treadmill elevation  Long: Continue to improve stamina. Short: progress workloads on all equipment Long: improve overall MET level Short: Increase hand weights  Long: Continue to improve stamina. Short: continue consistent exercise Long: improve MET level Short: continue to progress workloads Long: maintain fitness gains   Row Name 07/06/20 1350             Exercise Goal Re-Evaluation   Exercise Goals Review Increase Physical Activity;Increase Strength and Stamina;Understanding of Exercise Prescription       Comments [name] is doing well in rehab.  Things are picking up at work and he would like to go ahead and graduate this week.  He should improve on his post 6MWT.  He plans to continue to exercise by walking and using his treadmill at home.       Expected Outcomes Short: Improve post 6MWT Long: Continue to exercise independently              Discharge Exercise Prescription (Final Exercise Prescription Changes):  Exercise Prescription  Changes - 07/06/20 1300      Response to Exercise   Blood Pressure (Admit) 100/70    Blood Pressure (Exercise) 146/70    Blood Pressure (Exit) 108/68    Heart Rate (Admit) 54 bpm    Heart Rate (Exercise) 126 bpm    Heart Rate (Exit) 112 bpm    Rating of Perceived Exertion (Exercise) 15    Symptoms none    Duration Continue with 30 min of aerobic exercise without signs/symptoms of physical distress.    Intensity THRR unchanged      Progression   Progression Continue to progress workloads to maintain intensity without signs/symptoms of physical distress.    Average METs 7.19      Resistance Training   Training Prescription Yes    Weight 7 lb    Reps 10-15      Interval Training   Interval Training No      Treadmill   MPH 3.7    Grade 3    Minutes 15    METs 5.36      Recumbant Elliptical   Level 6    Minutes 15    METs 5.5      REL-XR   Level 10  Minutes 15    METs 10.7      Home Exercise Plan   Plans to continue exercise at Home (comment)   walking in neighborhood and TM at home on off days of rehab   Frequency Add 2 additional days to program exercise sessions.    Initial Home Exercises Provided 04/16/20           Nutrition:  Target Goals: Understanding of nutrition guidelines, daily intake of sodium <1555m, cholesterol <2085m calories 30% from fat and 7% or less from saturated fats, daily to have 5 or more servings of fruits and vegetables.  Education: Controlling Sodium/Reading Food Labels -Group verbal and written material supporting the discussion of sodium use in heart healthy nutrition. Review and explanation with models, verbal and written materials for utilization of the food label.   [facility]  Date 10/31/18  Educator LB  Instruction Review Code 1- Verbalizes Understanding      Education: General Nutrition Guidelines/Fats and Fiber: -Group instruction provided by verbal, written  material, models and posters to present the general guidelines for heart healthy nutrition. Gives an explanation and review of dietary fats and fiber.   [facility]  Date 10/29/18  Educator LB  Instruction Review Code 1- Verbalizes Understanding      Biometrics:  Pre Biometrics - 03/30/20 1627      Pre Biometrics   Height 5' 11.25" (1.81 m)    Weight 203 lb 6.4 oz (92.3 kg)    BMI (Calculated) 28.16    Single Leg Stand 30 seconds           Post Biometrics - 07/06/20 1751       Post  Biometrics   Height 5' 11.25" (1.81 m)    Weight 208 lb 14.4 oz (94.8 kg)    BMI (Calculated) 28.92    Single Leg Stand 30 seconds           Nutrition Therapy Plan and Nutrition Goals:  Nutrition Therapy & Goals - 04/14/20 1231      Nutrition Therapy   Diet HH, Low Na, DM    Drug/Food Interactions Purine/Gout    Protein (specify units) 75-80g    Fiber 30 grams    Whole Grain Foods 3 servings    Saturated Fats 12 max. grams    Fruits and Vegetables 8 servings/day    Sodium 1.5 grams      Personal Nutrition Goals   Nutrition Goal LT: maintain HH lifestyle under supervision    Comments Predominatley whole food plant based diet with some meals from the meal service purple carrot as well as calcium supplements and fish 2x/week. Pt taking B12 supplement. Reviewed HH eating, vitamin D recommendations, variety, and protein.      Intervention Plan   Intervention Prescribe, educate and counsel regarding individualized specific dietary modifications aiming towards targeted core components such as weight, hypertension, lipid management, diabetes, heart failure and other comorbidities.    Expected Outcomes Short Term Goal: Understand basic principles of dietary content, such as calories, fat, sodium, cholesterol and nutrients.;Short Term Goal: A plan has been developed with personal nutrition goals set during dietitian appointment.;Long Term Goal:  Adherence to prescribed nutrition plan.           Nutrition Assessments:  Nutrition Assessments - 03/30/20 1624      MEDFICTS Scores   Pre Score 0  MEDIFICTS Score Key:          ?70 Need to make dietary changes          40-70 Heart Healthy Diet         ? 40 Therapeutic Level Cholesterol Diet  Nutrition Goals Re-Evaluation:  Nutrition Goals Re-Evaluation    [place] Name 04/20/20 1700 06/15/20 1711           Goals   Nutrition Goal LT: maintain [place] lifestyle under supervision LT: maintain [place] lifestyle under supervision      Comment Pt would like to continue with current heart healthy changes and mostly vegan diet, no questions at this time. Pt would like to continue with current heart healthy changes and mostly vegan diet, no questions at this time.      Expected Outcome LT: maintain [place] lifestyle under supervision LT: maintain weight and heart health             Nutrition Goals Discharge (Final Nutrition Goals Re-Evaluation):  Nutrition Goals Re-Evaluation - 06/15/20 1711      Goals   Nutrition Goal LT: maintain [place] lifestyle under supervision    Comment Pt would like to continue with current heart healthy changes and mostly vegan diet, no questions at this time.    Expected Outcome LT: maintain weight and heart health           Psychosocial: Target Goals: Acknowledge presence or absence of significant depression and/or stress, maximize coping skills, provide positive support system. Participant is able to verbalize types and ability to use techniques and skills needed for reducing stress and depression.   Education: Depression - Provides group verbal and written instruction on the correlation between heart/lung disease and depressed mood, treatment options, and the stigmas associated with seeking treatment.   Education: Sleep Hygiene -Provides group verbal and written instruction about how sleep can affect your health.  Define sleep hygiene, discuss sleep  cycles and impact of sleep habits. Review good sleep hygiene tips.     Education: Stress and Anxiety: - Provides group verbal and written instruction about the health risks of elevated stress and causes of high stress.  Discuss the correlation between heart/lung disease and anxiety and treatment options. Review healthy ways to manage with stress and anxiety.   Cardiac Rehab from 10/31/2018 in [facility] Cardiac and Pulmonary Rehab  Date 10/10/18  Educator Lucianne Lei, MSW  Instruction Review Code 1- Verbalizes Understanding       Initial Review & Psychosocial Screening:  Initial Psych Review & Screening - 03/19/20 0944      Initial Review   Current issues with None Identified      Family Dynamics   Good Support System? Yes    Comments He can look to his parents, brother and wife for support.      Barriers   Psychosocial barriers to participate in program There are no identifiable barriers or psychosocial needs.;The patient should benefit from training in stress management and relaxation.      Screening Interventions   Interventions Encouraged to exercise;Program counselor consult;To provide support and resources with identified psychosocial needs;Provide feedback about the scores to participant    Expected Outcomes Short Term goal: Utilizing psychosocial counselor, staff and physician to assist with identification of specific Stressors or current issues interfering with healing process. Setting desired goal for each stressor or current issue identified.;Long Term Goal: Stressors or current issues are controlled or eliminated.;Short Term goal: Identification and review with participant of any Quality of Life or  Depression concerns found by scoring the questionnaire.;Long Term goal: The participant improves quality of Life and PHQ9 Scores as seen by post scores and/or verbalization of changes           Quality of Life Scores:   Quality of Life - 03/30/20 1623      Quality of Life    Select Quality of Life      Quality of Life Scores   Health/Function Pre 20.13 %    Socioeconomic Pre 22.71 %    Psych/Spiritual Pre 22.14 %    Family Pre 29.5 %    GLOBAL Pre 22.46 %          Scores of 19 and below usually indicate a poorer quality of life in these areas.  A difference of  2-3 points is a clinically meaningful difference.  A difference of 2-3 points in the total score of the Quality of Life Index has been associated with significant improvement in overall quality of life, self-image, physical symptoms, and general health in studies assessing change in quality of life.  PHQ-9: Recent Review Flowsheet Data    Depression screen [facility] 2/9 03/30/2020 03/04/2020 01/06/2020 12/23/2019 03/28/2019   Decreased Interest 0 0 0 0 0   Down, Depressed, Hopeless 0 0 0 0 0   PHQ - 2 Score 0 0 0 0 0   Altered sleeping 0 0 0 0 0   Tired, decreased energy 1 0 1 1 0   Change in appetite 0 0 1 1 0   Feeling bad or failure about yourself  0 0 0 0 0   Trouble concentrating 0 0 0 0 0   Moving slowly or fidgety/restless 0 0 0 0 0   Suicidal thoughts 0 0 0 0 0   PHQ-9 Score 1 0 2 2 0   Difficult doing work/chores Somewhat difficult - Not difficult at all Not difficult at all Not difficult at all     Interpretation of Total Score  Total Score Depression Severity:  1-4 = Minimal depression, 5-9 = Mild depression, 10-14 = Moderate depression, 15-19 = Moderately severe depression, 20-27 = Severe depression   Psychosocial Evaluation and Intervention:  Psychosocial Evaluation - 03/19/20 0949      Psychosocial Evaluation & Interventions   Interventions Encouraged to exercise with the program and follow exercise prescription    Comments [name] has done the program before and has a positive outlook on his health    Expected Outcomes Short: Attend [facility] stress management education to decrease stress. Long: Maintain exercise Post HeartTrack to keep stress at a minimum.    Continue Psychosocial  Services  Follow up required by staff           Psychosocial Re-Evaluation:  Psychosocial Re-Evaluation    [place] Name 04/30/20 1727 06/15/20 1709           Psychosocial Re-Evaluation   Current issues with Current Stress Concerns Current Stress Concerns      Comments [name] reports  that he is settled in his new house and is sleeping well. No new stress concerns reported. He did recently do a sleep study which showed that he does have sleep apnea and he is going to be getting a C-pap machine soon to help with this. He does say he feels well rested in the morning and has energy during the day. [name] reports no new stress concerns.  He doesnt have his CPAP yet but does sleep at a slight incline.  he feels  well rested and has energy.      Expected Outcomes Short: Get and start using C-pap machine to help with sleep apnea. Long: Maintain good mental health habits. Short: continue good sleep habits Long: maintain positive outlook      Interventions Encouraged to attend Cardiac Rehabilitation for the exercise --      Continue Psychosocial Services  Follow up required by staff --             Psychosocial Discharge (Final Psychosocial Re-Evaluation):  Psychosocial Re-Evaluation - 06/15/20 1709      Psychosocial Re-Evaluation   Current issues with Current Stress Concerns    Comments [name] reports no new stress concerns.  He doesnt have his CPAP yet but does sleep at a slight incline.  he feels well rested and has energy.    Expected Outcomes Short: continue good sleep habits Long: maintain positive outlook           Vocational Rehabilitation: Provide vocational rehab assistance to qualifying candidates.   Vocational Rehab Evaluation & Intervention:   Education: Education Goals: Education classes will be provided on a variety of topics geared toward better understanding of heart health and risk factor modification. Participant will state understanding/return demonstration of topics  presented as noted by education test scores.  Learning Barriers/Preferences:  Learning Barriers/Preferences - 03/19/20 0947      Learning Barriers/Preferences   Learning Barriers None    Learning Preferences None           General Cardiac Education Topics:  AED/CPR: - Group verbal and written instruction with the use of models to demonstrate the basic use of the AED with the basic ABC's of resuscitation.   Cardiac Rehab from 10/31/2018 in [facility] Cardiac and Pulmonary Rehab  Date 10/24/18  Educator CE  Instruction Review Code 1- Verbalizes Understanding      Anatomy & Physiology of the Heart: - Group verbal and written instruction and models provide basic cardiac anatomy and physiology, with the coronary electrical and arterial systems. Review of Valvular disease and Heart Failure   Cardiac Rehab from 10/31/2018 in [facility] Cardiac and Pulmonary Rehab  Date 10/08/18  Educator MA  Instruction Review Code 1- Verbalizes Understanding      Cardiac Procedures: - Group verbal and written instruction to review commonly prescribed medications for heart disease. Reviews the medication, class of the drug, and side effects. Includes the steps to properly store meds and maintain the prescription regimen. (beta blockers and nitrates)   Cardiac Rehab from 10/31/2018 in [facility] Cardiac and Pulmonary Rehab  Date 10/22/18  Educator CE  Instruction Review Code 1- Verbalizes Understanding      Cardiac Medications I: - Group verbal and written instruction to review commonly prescribed medications for heart disease. Reviews the medication, class of the drug, and side effects. Includes the steps to properly store meds and maintain the prescription regimen.   Cardiac Rehab from 10/31/2018 in [facility] Cardiac and Pulmonary Rehab  Date 10/15/18  Educator CE  Instruction Review Code 1- Verbalizes Understanding      Cardiac Medications II: -Group verbal and written instruction to review commonly prescribed  medications for heart disease. Reviews the medication, class of the drug, and side effects. (all other drug classes)   Cardiac Rehab from 10/31/2018 in [facility] Cardiac and Pulmonary Rehab  Date 10/03/18  Educator C.Enterkin, RN  Instruction Review Code 1- Verbalizes Understanding       Go Sex-Intimacy & Heart Disease, Get SMART - Goal Setting: - Group verbal and  written instruction through game format to discuss heart disease and the return to sexual intimacy. Provides group verbal and written material to discuss and apply goal setting through the application of the S.M.A.R.T. Method.   Cardiac Rehab from 10/31/2018 in [facility] Cardiac and Pulmonary Rehab  Date 10/22/18  Educator CE  Instruction Review Code 1- Verbalizes Understanding      Other Matters of the Heart: - Provides group verbal, written materials and models to describe Stable Angina and Peripheral Artery. Includes description of the disease process and treatment options available to the cardiac patient.   Infection Prevention: - Provides verbal and written material to individual with discussion of infection control including proper hand washing and proper equipment cleaning during exercise session.   Cardiac Rehab from 03/30/2020 in [facility] Cardiac and Pulmonary Rehab  Date 03/30/20  Educator AS  Instruction Review Code 1- Verbalizes Understanding      Falls Prevention: - Provides verbal and written material to individual with discussion of falls prevention and safety.   Cardiac Rehab from 03/30/2020 in [facility] Cardiac and Pulmonary Rehab  Date 03/30/20  Educator AS  Instruction Review Code 1- Verbalizes Understanding      Other: -Provides group and verbal instruction on various topics (see comments)   Knowledge Questionnaire Score:  Knowledge Questionnaire Score - 03/30/20 1625      Knowledge Questionnaire Score   Pre Score 25/26           Core Components/Risk Factors/Patient Goals at Admission:  Personal Goals and  Risk Factors at Admission - 03/30/20 1625      Core Components/Risk Factors/Patient Goals on Admission    Weight Management Yes;Weight Maintenance    Intervention Weight Management: Develop a combined nutrition and exercise program designed to reach desired caloric intake, while maintaining appropriate intake of nutrient and fiber, sodium and fats, and appropriate energy expenditure required for the weight goal.;Weight Management: Provide education and appropriate resources to help participant work on and attain dietary goals.    Admit Weight 203 lb 6.4 oz (92.3 kg)    Goal Weight: Short Term 200 lb (90.7 kg)    Goal Weight: Long Term 200 lb (90.7 kg)    Expected Outcomes Short Term: Continue to assess and modify interventions until short term weight is achieved;Long Term: Adherence to nutrition and physical activity/exercise program aimed toward attainment of established weight goal;Weight Maintenance: Understanding of the daily nutrition guidelines, which includes 25-35% calories from fat, 7% or less cal from saturated fats, less than 247m cholesterol, less than 1.5gm of sodium, & 5 or more servings of fruits and vegetables daily;Weight Loss: Understanding of general recommendations for a balanced deficit meal plan, which promotes 1-2 lb weight loss per week and includes a negative energy balance of [phone] kcal/d;Understanding recommendations for meals to include 15-35% energy as protein, 25-35% energy from fat, 35-60% energy from carbohydrates, less than 2082mof dietary cholesterol, 20-35 gm of total fiber daily;Understanding of distribution of calorie intake throughout the day with the consumption of 4-5 meals/snacks    Diabetes Yes    Intervention Provide education about signs/symptoms and action to take for hypo/hyperglycemia.;Provide education about proper nutrition, including hydration, and aerobic/resistive exercise prescription along with prescribed medications to achieve blood glucose in  normal ranges: Fasting glucose 65-99 mg/dL    Expected Outcomes Short Term: Participant verbalizes understanding of the signs/symptoms and immediate care of hyper/hypoglycemia, proper foot care and importance of medication, aerobic/resistive exercise and nutrition plan for blood glucose control.;Long Term: Attainment of HbA1C <  7%.    Intervention Provide a combined exercise and nutrition program that is supplemented with education, support and counseling about heart failure. Directed toward relieving symptoms such as shortness of breath, decreased exercise tolerance, and extremity edema.    Expected Outcomes Improve functional capacity of life;Short term: Attendance in program 2-3 days a week with increased exercise capacity. Reported lower sodium intake. Reported increased fruit and vegetable intake. Reports medication compliance.;Long term: Adoption of self-care skills and reduction of barriers for early signs and symptoms recognition and intervention leading to self-care maintenance.;Short term: Daily weights obtained and reported for increase. Utilizing diuretic protocols set by physician.    Intervention Provide education on lifestyle modifcations including regular physical activity/exercise, weight management, moderate sodium restriction and increased consumption of fresh fruit, vegetables, and low fat dairy, alcohol moderation, and smoking cessation.;Monitor prescription use compliance.    Intervention Provide education and support for participant on nutrition & aerobic/resistive exercise along with prescribed medications to achieve LDL <29m, HDL >42m    Expected Outcomes Short Term: Participant states understanding of desired cholesterol values and is compliant with medications prescribed. Participant is following exercise prescription and nutrition guidelines.;Long Term: Cholesterol controlled with medications as prescribed, with individualized exercise RX and with personalized nutrition plan.  Value goals: LDL < 7073mHDL > 40 mg.           Education:Diabetes - Individual verbal and written instruction to review signs/symptoms of diabetes, desired ranges of glucose level fasting, after meals and with exercise. Acknowledge that pre and post exercise glucose checks will be done for 3 sessions at entry of program.   [facility]  Date 03/19/20  [facility] Code 1- Verbalizes Understanding      Education: Know Your Numbers and Risk Factors: -Group verbal and written instruction about important numbers in your health.  Discussion of what are risk factors and how they play a role in the disease process.  Review of Cholesterol, Blood Pressure, Diabetes, and BMI and the role they play in your overall health.   [facility]  Date 10/03/18  Educator C.Enterkin, RN  Instruction Review Code 1- Verbalizes Understanding      Core Components/Risk Factors/Patient Goals Review:   Goals and Risk Factor Review    Row Name 04/30/20 1716 06/15/20 1705 06/15/20 1735         Core Components/Risk Factors/Patient Goals Review   Personal Goals Review Weight Management/Obesity;Lipids;Hypertension;Stress;Diabetes Weight Management/Obesity;Lipids;Hypertension;Stress;Diabetes --     Review SteRichardson Landryports taking all medications as prescribed. He checks his blood sugar several times a day. Blood pressure and lipids are reported to be under control. He is maintaining weight and continues to mainly eat a vegan diet. He cooks mostly at home.  He decided to buy a TM to have at home when walking outside is not possible. SteRichardson Landry just back from vacation.  He is taking meds as directed.  He is mainly eating plant based diet and maintaining weight.  He walked 10k steps per day while on vacation. He has enjoyed going throught the program and feels he has worked harder than he would on his own.      Expected Outcomes Short: Continue to attend cardiac rehab with regular attendence and continue to monitor BG and take all meds. Long: Continue to control risk factors with heart healthy lifestyle. Short: continue to exercise consistently and maintain healthy diet  Long:  maintain healthy habits --            Core Components/Risk Factors/Patient Goals at Discharge (Final Review):   Goals and Risk Factor Review - 06/15/20 1735      Core Components/Risk Factors/Patient Goals Review   Review He has enjoyed going throught the program and feels he has worked harder than he would on his own.           ITP Comments:  ITP Comments    Row Name 03/19/20 0952 04/06/20 1719 04/14/20 1243 04/15/20 0515 05/13/20 0537   ITP Comments Virtual Orientation performed. Patient informed when to come in for RD and EP orientation. Diagnosis can be found in [facility] 03/04/2020. First full day of exercise!  Patient was oriented to gym and equipment including functions, settings, policies, and procedures.  Patient's individual exercise prescription and treatment plan were reviewed.  All starting workloads were established based on the results of the 6 minute walk test done at initial orientation visit.  The plan for exercise progression was also introduced and progression will be customized based on patient's performance and goals. Completed Initial RD Eval 30 Day review completed. Medical Director review done, changes made as directed,and approval shown by signature of Market researcher. 30 Day review completed. ITP review done, changes made as directed,and approval shown by signature of  Scientist, research (life sciences).   [place] Name 06/10/20 0533 07/08/20 0629         ITP Comments 30 Day review completed. Medical Director ITP review done, changes made as directed, and signed approval by Medical Director. 30 Day review completed. Medical Director ITP review done, changes made as directed, and signed approval by Medical Director.              Comments:

## 2020-07-08 NOTE — Progress Notes (Signed)
Daily Session Note  Patient Details  Name: John Hartman MRN: [id number] Date of Birth: 11/22/1971 Referring Provider:     Cardiac Rehab from 03/30/2020 in [facility] Cardiac and Pulmonary Rehab  Referring Provider Paraschos      Encounter Date: 07/08/2020  Check In:  Session Check In - 07/08/20 1650      Check-In   Supervising physician immediately available to respond to emergencies See telemetry face sheet for immediately available ER MD    Location [facility]-Cardiac & Pulmonary Rehab    Staff Present Justin Mend RCP,RRT,BSRT;Eliyanah Elgersma Sherryll Burger, RN Margurite Auerbach, MS Exercise Physiologist    Virtual Visit No    Medication changes reported     No    Fall or balance concerns reported    No    Warm-up and Cool-down Performed on first and last piece of equipment    Resistance Training Performed Yes    VAD Patient? No    PAD/SET Patient? No      Pain Assessment   Currently in Pain? No/denies              Social History   Tobacco Use  Smoking Status Former Smoker  . Packs/day: 1.00  . Years: 10.00  . Pack years: 10.00  . Types: Cigarettes  . Quit date: 04/15/2003  . Years since quitting: 17.2  Smokeless Tobacco Never Used    Goals Met:  Independence with exercise equipment Exercise tolerated well No report of cardiac concerns or symptoms Strength training completed today  Goals Unmet:  Not Applicable  Comments: Pt able to follow exercise prescription today without complaint.  Will continue to monitor for progression.    Dr. Emily Filbert is Medical Director for [place] and [name].

## 2020-07-09 ENCOUNTER — Encounter: Payer: No Typology Code available for payment source | Admitting: *Deleted

## 2020-07-09 DIAGNOSIS — I214 Non-ST elevation (NSTEMI) myocardial infarction: Secondary | ICD-10-CM | POA: Diagnosis not present

## 2020-07-09 DIAGNOSIS — Z955 Presence of coronary angioplasty implant and graft: Secondary | ICD-10-CM

## 2020-07-09 NOTE — Progress Notes (Signed)
Discharge Progress Report  Patient Details  Name: John Hartman MRN: [id number] Date of Birth: 1971-08-10 Referring Provider:     Cardiac Rehab from 03/30/2020 in [facility]  Referring Provider Paraschos       Number of Visits: 20  Reason for Discharge:  Patient reached a stable level of exercise. Patient independent in their exercise. Patient has met program and personal goals.  Smoking History:  Social History   Tobacco Use  Smoking Status Former Smoker  . Packs/day: 1.00  . Years: 10.00  . Pack years: 10.00  . Types: Cigarettes  . Quit date: 04/15/2003  . Years since quitting: 17.2  Smokeless Tobacco Never Used    Diagnosis:  NSTEMI (non-ST elevated myocardial infarction) ([facility])  Status post coronary artery stent placement  ADL [facility]SD:   Initial Exercise Prescription:  Initial Exercise Prescription - 03/30/20 1600      Date of Initial Exercise RX and Referring Provider   Date 03/30/20    Referring Provider Paraschos      Treadmill   MPH 3.6    Grade 1    Minutes 15    METs 4.25      Elliptical   Level 1    Speed 3    Minutes 15    METs 4      REL-XR   Level 4    Speed 50    Minutes 15    METs 4      Prescription Details   Frequency (times per week) 3    Duration Progress to 30 minutes of continuous aerobic without signs/symptoms of physical distress      Intensity   THRR 40-80% of Max Heartrate 125-157    Ratings of Perceived Exertion 11-15    Perceived Dyspnea 0-4      Resistance Training   Training Prescription Yes    Weight 3 lb    Reps 10-15           Discharge Exercise Prescription (Final Exercise Prescription Changes):  Exercise Prescription Changes - 07/06/20 1300      Response to Exercise   Blood Pressure (Admit) 100/70    Blood Pressure (Exercise) 146/70    Blood Pressure (Exit) 108/68    Heart Rate (Admit) 54 bpm    Heart Rate (Exercise) 126 bpm    Heart Rate (Exit) 112 bpm    Rating of Perceived  Exertion (Exercise) 15    Symptoms none    Duration Continue with 30 min of aerobic exercise without signs/symptoms of physical distress.    Intensity THRR unchanged      Progression   Progression Continue to progress workloads to maintain intensity without signs/symptoms of physical distress.    Average METs 7.19      Resistance Training   Training Prescription Yes    Weight 7 lb    Reps 10-15      Interval Training   Interval Training No      Treadmill   MPH 3.7    Grade 3    Minutes 15    METs 5.36      Recumbant Elliptical   Level 6    Minutes 15    METs 5.5      REL-XR   Level 10    Minutes 15    METs 10.7      Home Exercise Plan   Plans to continue exercise at Home (comment)   walking in neighborhood and TM at home on off days  of rehab   Frequency Add 2 additional days to program exercise sessions.    Initial Home Exercises Provided 04/16/20           Functional Capacity:  6 Minute Walk    Row Name 03/30/20 1619 07/06/20 1752       6 Minute Walk   Phase Initial Discharge    Distance 1905 feet 2025 feet    Distance % Change -- 6.3 %    Distance Feet Change -- 120 ft    Walk Time 6 minutes 6 minutes    # of Rest Breaks 0 0    MPH 3.6 3.84    METS 4.25 5.89    RPE 12 13    Perceived Dyspnea  1 --    VO2 Peak 14.9 20.63    Symptoms No No    Resting HR 93 bpm 95 bpm    Resting BP 114/76 108/70    Resting Oxygen Saturation  97 % --    Exercise Oxygen Saturation  during 6 min walk 97 % --    Max Ex. HR 129 bpm 142 bpm    Max Ex. BP 128/74 140/80    2 Minute Post BP 120/68 --           Psychological, QOL, Others - Outcomes: PHQ 2/9: Depression screen [facility] 2/9 07/09/2020 03/30/2020 03/04/2020 01/06/2020 12/23/2019  Decreased Interest 0 0 0 0 0  Down, Depressed, Hopeless 0 0 0 0 0  PHQ - 2 Score 0 0 0 0 0  Altered sleeping 0 0 0 0 0  Tired, decreased energy 0 1 0 1 1  Change in appetite 0 0 0 1 1  Feeling bad or failure about yourself  0 0 0 0 0   Trouble concentrating 0 0 0 0 0  Moving slowly or fidgety/restless 0 0 0 0 0  Suicidal thoughts 0 0 0 0 0  PHQ-9 Score 0 1 0 2 2  Difficult doing work/chores - Somewhat difficult - Not difficult at all Not difficult at all  Some recent data might be hidden    Quality of Life:  Quality of Life - 07/09/20 1721      Quality of Life   Select Quality of Life      Quality of Life Scores   Health/Function Pre 20.13 %    Health/Function Post 22.57 %    Health/Function % Change 12.12 %    Socioeconomic Pre 22.71 %    Socioeconomic Post 19.81 %    Socioeconomic % Change  -12.77 %    Psych/Spiritual Pre 22.14 %    Psych/Spiritual Post 22.21 %    Psych/Spiritual % Change 0.32 %    Family Pre 29.5 %    Family Post 23.4 %    Family % Change -20.68 %    GLOBAL Pre 22.46 %    GLOBAL Post 21.99 %    GLOBAL % Change -2.09 %           Nutrition & Weight - Outcomes:  Pre Biometrics - 03/30/20 1627      Pre Biometrics   Height 5' 11.25" (1.81 m)    Weight 203 lb 6.4 oz (92.3 kg)    BMI (Calculated) 28.16    Single Leg Stand 30 seconds           Post Biometrics - 07/06/20 1751       Post  Biometrics   Height 5' 11.25" (1.81 m)    Weight  208 lb 14.4 oz (94.8 kg)    BMI (Calculated) 28.92    Single Leg Stand 30 seconds           Nutrition:  Nutrition Therapy & Goals - 04/14/20 1231      Nutrition Therapy   Diet HH, Low Na, DM    Drug/Food Interactions Purine/Gout    Protein (specify units) 75-80g    Fiber 30 grams    Whole Grain Foods 3 servings    Saturated Fats 12 max. grams    Fruits and Vegetables 8 servings/day    Sodium 1.5 grams      Personal Nutrition Goals   Nutrition Goal LT: maintain HH lifestyle under supervision    Comments Predominatley whole food plant based diet with some meals from the meal service purple carrot as well as calcium supplements and fish 2x/week. Pt taking B12 supplement. Reviewed HH eating, vitamin D recommendations, variety, and  protein.      Intervention Plan   Intervention Prescribe, educate and counsel regarding individualized specific dietary modifications aiming towards targeted core components such as weight, hypertension, lipid management, diabetes, heart failure and other comorbidities.    Expected Outcomes Short Term Goal: Understand basic principles of dietary content, such as calories, fat, sodium, cholesterol and nutrients.;Short Term Goal: A plan has been developed with personal nutrition goals set during dietitian appointment.;Long Term Goal: Adherence to prescribed nutrition plan.           Nutrition Discharge:  Nutrition Assessments - 07/09/20 1722      MEDFICTS Scores   Post Score 0           Education Questionnaire Score:  Knowledge Questionnaire Score - 07/09/20 1721      Knowledge Questionnaire Score   Post Score 25/26           Goals reviewed with patient; copy given to patient.

## 2020-07-09 NOTE — Progress Notes (Signed)
Cardiac Individual Treatment Plan  Patient Details  Name: John Hartman MRN: [id number] Date of Birth: [birth year] Referring Provider:     Cardiac Rehab from 03/30/2020 in [facility] Cardiac and Pulmonary Rehab  Referring Provider Paraschos      Initial Encounter Date:    Cardiac Rehab from 03/30/2020 in [facility] Cardiac and Pulmonary Rehab  Date 03/30/20      Visit Diagnosis: NSTEMI (non-ST elevated myocardial infarction) [facility])  Status post coronary artery stent placement  Patient's Home Medications on Admission:  Current Outpatient Medications:  .  allopurinol (ZYLOPRIM) 100 MG tablet, Take 100 mg by mouth daily. , Disp: , Rfl:  .  amLODipine (NORVASC) 10 MG tablet, Take 1 tablet (10 mg total) by mouth daily., Disp: 30 tablet, Rfl: 1 .  aspirin EC 81 MG tablet, Take 81 mg by mouth daily., Disp: , Rfl:  .  atorvastatin (LIPITOR) 80 MG tablet, Take 1 tablet (80 mg total) by mouth daily at 6 PM. (Patient taking differently: Take 80 mg by mouth daily with supper. ), Disp: 30 tablet, Rfl: 0 .  cetirizine (ZYRTEC) 10 MG tablet, Take 10 mg by mouth at bedtime., Disp: , Rfl:  .  Dapagliflozin-metFORMIN HCl ER (XIGDUO XR) 04-999 MG TB24, Take 1 tablet by mouth in the morning and at bedtime. , Disp: , Rfl:  .  diphenhydrAMINE (BENADRYL) 25 MG tablet, Take 50 mg by mouth at bedtime., Disp: , Rfl:  .  ezetimibe (ZETIA) 10 MG tablet, Take 1 tablet (10 mg total) by mouth daily., Disp: 30 tablet, Rfl: 11 .  hydrALAZINE (APRESOLINE) 25 MG tablet, Take 1 tablet (25 mg total) by mouth 2 (two) times daily., Disp: 60 tablet, Rfl: 0 .  Menthol, Topical Analgesic, (BIOFREEZE EX), Apply 1 application topically 3 (three) times daily as needed (muscle cramps/shoulder pain.)., Disp: , Rfl:  .  metoprolol tartrate (LOPRESSOR) 25 MG tablet, Take 25 mg by mouth 2 (two) times daily., Disp: , Rfl:  .  nitroGLYCERIN (NITROSTAT) 0.4 MG SL tablet, Place 1 tablet (0.4 mg total) under the tongue every 5 (five) minutes as needed for  chest pain., Disp: 30 tablet, Rfl: 0 .  pregabalin (LYRICA) 50 MG capsule, Take 1 capsule (50 mg total) by mouth 3 (three) times daily. (Patient taking differently: Take 50 mg by mouth 2 (two) times daily. 1 capsule in the morning and 2 capsules at night.), Disp: 90 capsule, Rfl: 5 .  RYBELSUS 3 MG TABS, Take 3 mg by mouth daily at 6 (six) AM. , Disp: , Rfl:  .  tiZANidine (ZANAFLEX) 4 MG tablet, Take 1 tablet (4 mg total) by mouth every 8 (eight) hours as needed for muscle spasms., Disp: 90 tablet, Rfl: 5 .  vitamin B-12 (CYANOCOBALAMIN) 1000 MCG tablet, Take 1,000 mcg by mouth daily. , Disp: , Rfl:   Past Medical History: Past Medical History:  Diagnosis Date  . Angioedema   . Bronchitis   . DDD (degenerative disc disease), cervical   . Diabetes mellitus ([place])   . Fatty liver 03/31/2017   [place] April 2018  . GERD (gastroesophageal reflux disease)   . Gout   . Heart attack ([place])   . HTN (hypertension)   . Hypertension    CONTROLLED ON MEDS  . IBS (irritable bowel syndrome)   . Kidney stone   . LVH (left ventricular hypertrophy) 11/24/2018   Moderate, ECHO  . Myocardial infarction ([place]) 08/23/2018  . Seasonal allergies   . Spinal stenosis     Tobacco Use:  Social History   Tobacco Use  Smoking Status Former Smoker  . Packs/day: 1.00  . Years: 10.00  . Pack years: 10.00  . Types: Cigarettes  . Quit date: 04/15/2003  . Years since quitting: 17.2  Smokeless Tobacco Never Used    Labs: Recent Hydrographic surveyor    Labs for ITP Cardiac and Pulmonary Rehab Latest Ref Rng & Units 02/14/2019 03/28/2019 10/18/2019 01/06/2020 02/08/2020   Cholestrol 0 - 200 mg/dL 464 920 - 391 914   LDLCALC 0 - 99 mg/dL 59 58 - 78 95   HDL >46 mg/dL 37(G) 60(K) - 47(S) 25(E)   Trlycerides <150 mg/dL 944 207 - 069 84   Hemoglobin A1c 4.8 - 5.6 % 6.1(H) - 7.2 - 6.4(H)       Exercise Target Goals: Exercise Program Goal: Individual exercise prescription set using results from initial 6 min walk  test and THRR while considering  patient's activity barriers and safety.   Exercise Prescription Goal: Initial exercise prescription builds to 30-45 minutes a day of aerobic activity, 2-3 days per week.  Home exercise guidelines will be given to patient during program as part of exercise prescription that the participant will acknowledge.   Education: Aerobic Exercise & Resistance Training: - Gives group verbal and written instruction on the various components of exercise. Focuses on aerobic and resistive training programs and the benefits of this training and how to safely progress through these programs..   Cardiac Rehab from 10/31/2018 in [place] Cardiac and Pulmonary Rehab  Date 09/24/18  Educator [facility]  Instruction Review Code 1- [employer] Understanding      Education: Exercise & Equipment Safety: - Individual verbal instruction and demonstration of equipment use and safety with use of the equipment.   Cardiac Rehab from 07/08/2020 in [facility] Cardiac and Pulmonary Rehab  Date 03/30/20  Educator AS  Instruction Review Code 1- Verbalizes Understanding      Education: Exercise Physiology & General Exercise Guidelines: - Group verbal and written instruction with models to review the exercise physiology of the cardiovascular system and associated critical values. Provides general exercise guidelines with specific guidelines to those with heart or lung disease.    Cardiac Rehab from 07/08/2020 in [facility] Cardiac and Pulmonary Rehab  Date 07/08/20  Educator AS  Instruction Review Code 1- Verbalizes Understanding      Education: Flexibility, Balance, Mind/Body Relaxation: Provides group verbal/written instruction on the benefits of flexibility and balance training, including mind/body exercise modes such as yoga, pilates and tai chi.  Demonstration and skill practice provided.   Cardiac Rehab from 10/31/2018 in [facility] Cardiac and Pulmonary Rehab  Date 10/01/18  Educator AS  Instruction Review Code  1- Verbalizes Understanding      Activity Barriers & Risk Stratification:   6 Minute Walk:  6 Minute Walk    Row Name 03/30/20 1619 07/06/20 1752       6 Minute Walk   Phase Initial Discharge    Distance 1905 feet 2025 feet    Distance % Change -- 6.3 %    Distance Feet Change -- 120 ft    Walk Time 6 minutes 6 minutes    # of Rest Breaks 0 0    MPH 3.6 3.84    METS 4.25 5.89    RPE 12 13    Perceived Dyspnea  1 --    VO2 Peak 14.9 20.63    Symptoms No No    Resting HR 93 bpm 95 bpm    Resting  BP 114/76 108/70    Resting Oxygen Saturation  97 % --    Exercise Oxygen Saturation  during 6 min walk 97 % --    Max Ex. HR 129 bpm 142 bpm    Max Ex. BP 128/74 140/80    2 Minute Post BP 120/68 --           Oxygen Initial Assessment:   Oxygen Re-Evaluation:   Oxygen Discharge (Final Oxygen Re-Evaluation):   Initial Exercise Prescription:  Initial Exercise Prescription - 03/30/20 1600      Date of Initial Exercise RX and Referring Provider   Date 03/30/20    Referring Provider Paraschos      Treadmill   MPH 3.6    Grade 1    Minutes 15    METs 4.25      Elliptical   Level 1    Speed 3    Minutes 15    METs 4      REL-XR   Level 4    Speed 50    Minutes 15    METs 4      Prescription Details   Frequency (times per week) 3    Duration Progress to 30 minutes of continuous aerobic without signs/symptoms of physical distress      Intensity   THRR 40-80% of Max Heartrate 125-157    Ratings of Perceived Exertion 11-15    Perceived Dyspnea 0-4      Resistance Training   Training Prescription Yes    Weight 3 lb    Reps 10-15           Perform Capillary Blood Glucose checks as needed.  Exercise Prescription Changes:   Exercise Prescription Changes    Row Name 03/30/20 1600 04/15/20 1100 04/16/20 1700 04/28/20 1400 05/11/20 1400     Response to Exercise   Blood Pressure (Admit) 114/76 122/78 -- 100/60 130/80   Blood Pressure (Exercise)  128/74 162/80 -- 160/80 168/86   Blood Pressure (Exit) 120/68 118/68 -- 108/64 128/72   Heart Rate (Admit) 93 bpm 81 bpm -- 95 bpm 95 bpm   Heart Rate (Exercise) 125 bpm 165 bpm -- 156 bpm 168 bpm   Heart Rate (Exit) 95 bpm 126 bpm -- 93 bpm 136 bpm   Rating of Perceived Exertion (Exercise) 12 14 -- 15 15   Perceived Dyspnea (Exercise) 1 -- -- -- --   Symptoms none none -- none back pain elliptical   Duration -- Continue with 30 min of aerobic exercise without signs/symptoms of physical distress. Continue with 30 min of aerobic exercise without signs/symptoms of physical distress. Continue with 30 min of aerobic exercise without signs/symptoms of physical distress. Continue with 30 min of aerobic exercise without signs/symptoms of physical distress.   Intensity -- THRR unchanged THRR unchanged THRR unchanged THRR unchanged     Progression   Progression -- Continue to progress workloads to maintain intensity without signs/symptoms of physical distress. Continue to progress workloads to maintain intensity without signs/symptoms of physical distress. Continue to progress workloads to maintain intensity without signs/symptoms of physical distress. Continue to progress workloads to maintain intensity without signs/symptoms of physical distress.   Average METs -- 4.57 4.57 6.5 3.75     Resistance Training   Training Prescription -- Yes Yes Yes Yes   Weight -- 5 lb 5 lb 7 lb 7 lb   Reps -- 10-15 10-15 10-15 10-15     Interval Training   Interval Training -- No No  No No     Treadmill   MPH -- 3.6 3.6 3.6 3.6   Grade -- '1 1 1 '$ 1.5   Minutes -- '15 15 15 15   '$ METs -- 4.25 4.25 4.25 4.5     Recumbant Elliptical   Level -- -- -- -- 2   Minutes -- -- -- -- 15   METs -- -- -- -- 3     Elliptical   Level -- 1 1 -- 1   Speed -- 3.7 3.7 -- 3   Minutes -- 15 15 -- 15     REL-XR   Level -- '6 6 6 '$ --   Speed -- -- -- 50 --   Minutes -- '15 15 15 '$ --   METs -- 4.9 4.9 8.8 --     Home Exercise Plan    Plans to continue exercise at -- -- Home (comment)  walking in neighborhood and TM at home on off days of rehab -- Home (comment)  walking in neighborhood and TM at home on off days of rehab   Frequency -- -- Add 2 additional days to program exercise sessions. -- Add 2 additional days to program exercise sessions.   Initial Home Exercises Provided -- -- 04/16/20 -- 04/16/20   Row Name 05/26/20 1500 06/10/20 1400 06/25/20 0900 07/06/20 1300       Response to Exercise   Blood Pressure (Admit) 120/62 122/80 104/68 100/70    Blood Pressure (Exercise) 158/80 158/70 130/70 146/70    Blood Pressure (Exit) 118/62 120/70 118/64 108/68    Heart Rate (Admit) 88 bpm 99 bpm 100 bpm 54 bpm    Heart Rate (Exercise) 146 bpm 165 bpm 158 bpm 126 bpm    Heart Rate (Exit) 127 bpm 126 bpm 102 bpm 112 bpm    Rating of Perceived Exertion (Exercise) '15 15 15 15    '$ Symptoms -- none none none    Duration Continue with 30 min of aerobic exercise without signs/symptoms of physical distress. Continue with 30 min of aerobic exercise without signs/symptoms of physical distress. Continue with 30 min of aerobic exercise without signs/symptoms of physical distress. Continue with 30 min of aerobic exercise without signs/symptoms of physical distress.    Intensity THRR unchanged THRR unchanged THRR unchanged THRR unchanged      Progression   Progression Continue to progress workloads to maintain intensity without signs/symptoms of physical distress. Continue to progress workloads to maintain intensity without signs/symptoms of physical distress. Continue to progress workloads to maintain intensity without signs/symptoms of physical distress. Continue to progress workloads to maintain intensity without signs/symptoms of physical distress.    Average METs 6.9 6.52 7.2 7.19      Resistance Training   Training Prescription Yes Yes Yes Yes    Weight 7 lb 7 lb 7 lb 7 lb    Reps 10-15 10-15 10-15 10-15      Interval Training    Interval Training No No No No      Treadmill   MPH 3.6 3.6 3.6 3.7    Grade '1 1 2 3    '$ Minutes '15 15 15 15    '$ METs 4.25 4.25 4.75 5.36      Recumbant Elliptical   Level -- -- -- 6    Minutes -- -- -- 15    METs -- -- -- 5.5      REL-XR   Level '8 9 10 10    '$ Speed 50 -- 50 --  Minutes '15 15 15 15    '$ METs 9.6 8.8 9.6 10.7      Home Exercise Plan   Plans to continue exercise at Home (comment)  walking in neighborhood and TM at home on off days of rehab Home (comment)  walking in neighborhood and TM at home on off days of rehab Home (comment)  walking in neighborhood and TM at home on off days of rehab Home (comment)  walking in neighborhood and TM at home on off days of rehab    Frequency Add 2 additional days to program exercise sessions. Add 2 additional days to program exercise sessions. Add 2 additional days to program exercise sessions. Add 2 additional days to program exercise sessions.    Initial Home Exercises Provided 04/16/20 04/16/20 04/16/20 04/16/20           Exercise Comments:   Exercise Goals and Review:   Exercise Goals    Row Name 03/30/20 1626             Exercise Goals   Increase Physical Activity Yes       Intervention Provide advice, education, support and counseling about physical activity/exercise needs.;Develop an individualized exercise prescription for aerobic and resistive training based on initial evaluation findings, risk stratification, comorbidities and participant's personal goals.       Expected Outcomes Short Term: Attend rehab on a regular basis to increase amount of physical activity.;Long Term: Add in home exercise to make exercise part of routine and to increase amount of physical activity.;Long Term: Exercising regularly at least 3-5 days a week.       Increase Strength and Stamina Yes       Intervention Provide advice, education, support and counseling about physical activity/exercise needs.;Develop an individualized exercise  prescription for aerobic and resistive training based on initial evaluation findings, risk stratification, comorbidities and participant's personal goals.       Expected Outcomes Short Term: Increase workloads from initial exercise prescription for resistance, speed, and METs.;Short Term: Perform resistance training exercises routinely during rehab and add in resistance training at home;Long Term: Improve cardiorespiratory fitness, muscular endurance and strength as measured by increased METs and functional capacity (6MWT)       Able to understand and use rate of perceived exertion (RPE) scale Yes       Intervention Provide education and explanation on how to use RPE scale       Expected Outcomes Short Term: Able to use RPE daily in rehab to express subjective intensity level;Long Term:  Able to use RPE to guide intensity level when exercising independently       Able to understand and use Dyspnea scale Yes       Intervention Provide education and explanation on how to use Dyspnea scale       Expected Outcomes Short Term: Able to use Dyspnea scale daily in rehab to express subjective sense of shortness of breath during exertion;Long Term: Able to use Dyspnea scale to guide intensity level when exercising independently       Knowledge and understanding of Target Heart Rate Range (THRR) Yes       Intervention Provide education and explanation of THRR including how the numbers were predicted and where they are located for reference       Expected Outcomes Short Term: Able to state/look up THRR;Short Term: Able to use daily as guideline for intensity in rehab;Long Term: Able to use THRR to govern intensity when exercising independently  Able to check pulse independently Yes       Intervention Provide education and demonstration on how to check pulse in carotid and radial arteries.;Review the importance of being able to check your own pulse for safety during independent exercise       Expected Outcomes  Short Term: Able to explain why pulse checking is important during independent exercise;Long Term: Able to check pulse independently and accurately       Understanding of Exercise Prescription Yes       Intervention Provide education, explanation, and written materials on patient's individual exercise prescription       Expected Outcomes Short Term: Able to explain program exercise prescription;Long Term: Able to explain home exercise prescription to exercise independently              Exercise Goals Re-Evaluation :  Exercise Goals Re-Evaluation    Row Name 04/06/20 1721 04/15/20 1108 04/16/20 1715 04/28/20 1444 04/30/20 1711     Exercise Goal Re-Evaluation   Exercise Goals Review Increase Physical Activity;Able to understand and use rate of perceived exertion (RPE) scale;Knowledge and understanding of Target Heart Rate Range (THRR);Understanding of Exercise Prescription;Increase Strength and Stamina;Able to check pulse independently Increase Physical Activity;Increase Strength and Stamina;Understanding of Exercise Prescription Increase Physical Activity;Increase Strength and Stamina;Understanding of Exercise Prescription;Able to understand and use rate of perceived exertion (RPE) scale;Knowledge and understanding of Target Heart Rate Range (THRR);Able to check pulse independently Increase Physical Activity;Increase Strength and Stamina;Able to understand and use rate of perceived exertion (RPE) scale;Able to understand and use Dyspnea scale;Knowledge and understanding of Target Heart Rate Range (THRR);Able to check pulse independently;Understanding of Exercise Prescription Increase Physical Activity;Increase Strength and Stamina;Able to understand and use rate of perceived exertion (RPE) scale;Able to understand and use Dyspnea scale;Knowledge and understanding of Target Heart Rate Range (THRR);Able to check pulse independently;Understanding of Exercise Prescription   Comments Reviewed RPE scale, THR  and program prescription with pt today.  Pt voiced understanding and was given a copy of goals to take home. John Hartman is off to a good start in rehab.  He is already doing well on the elliptical and up to level 6 on the XR.  We will continue to monitor his progress. Reviewed home exercise with pt today.  Pt plans to walk on TM and in neighborhood for exercise.  Reviewed THR, pulse, RPE, sign and symptoms, NTG use, and when to call 911 or MD.  Also discussed weather considerations and indoor options.  Pt voiced understanding. John Hartman is progressing well.  He works in correct HR and RPE ranges.  He has moved up to 7 lb for strength work. John Hartman has shown increases in workloads during rehab sessions. He has increased his elevation to 1.5% on the TM. The recumbant elliptical is tolerated better then the upright ellipticial due to back pain. He continues to make progress and increase in strength and stamina. John Hartman also reports walking regularly at home.   Expected Outcomes Short: Use RPE daily to regulate intensity. Long: Follow program prescription in THR. Short: Continue to improve workloads  Long: Continue to follow program prescription Short: Add 1-2 days of exercise at home on off days of cardiac rehab. Long: Become independent with exercise routine. Short:  continue to follow program prescription Long: increase overall MET level Short: Increase elevation on TM to 2% Long: Continue with independent exercise routine and increase strength and stamina.   [place] Name 05/11/20 1447 05/26/20 1601 06/10/20 1452 06/15/20 1708 06/25/20 6812  Exercise Goal Re-Evaluation   Exercise Goals Review Increase Physical Activity;Increase Strength and Stamina;Understanding of Exercise Prescription Increase Physical Activity;Increase Strength and Stamina;Understanding of Exercise Prescription Increase Physical Activity;Increase Strength and Stamina;Understanding of Exercise Prescription Increase Physical Activity;Increase Strength and  Stamina;Understanding of Exercise Prescription Increase Physical Activity;Increase Strength and Stamina;Understanding of Exercise Prescription   Comments John Hartman continues to do well in rehab.  He is enjoying being back in class again.  We have rotated him off the upright elliptical with his back.  We will continue to encourage him to increase elevation on treadmill and workload on recumbent elliptical.  We will continue to monitor his progress. John Hartman is progressing well and is at level 8 on the XR.  He attends consistently and works in [employer] range. John Hartman is doing well in rehab.  He is up to level 9 on the XR now.  We will continue to encourage good attendance and monitor his progress. John Hartman just returned from vacation and was active the whole time doing more than 10k steps per day.  He has a TM at home as well. John Hartman continues to progress workloads and METS.  He works in Futures trader and RPE range.  Staff will monitor progress.   Expected Outcomes Short: Increase treadmill elevation  Long: Continue to improve stamina. Short: progress workloads on all equipment Long: improve overall MET level Short: Increase hand weights  Long: Continue to improve stamina. Short: continue consistent exercise Long: improve MET level Short: continue to progress workloads Long: maintain fitness gains   Row Name 07/06/20 1350             Exercise Goal Re-Evaluation   Exercise Goals Review Increase Physical Activity;Increase Strength and Stamina;Understanding of Exercise Prescription       Comments John Hartman is doing well in rehab.  Things are picking up at work and he would like to go ahead and graduate this week.  He should improve on his post 6MWT.  He plans to continue to exercise by walking and using his treadmill at home.       Expected Outcomes Short: Improve post 6MWT Long: Continue to exercise independently              Discharge Exercise Prescription (Final Exercise Prescription Changes):  Exercise Prescription  Changes - 07/06/20 1300      Response to Exercise   Blood Pressure (Admit) 100/70    Blood Pressure (Exercise) 146/70    Blood Pressure (Exit) 108/68    Heart Rate (Admit) 54 bpm    Heart Rate (Exercise) 126 bpm    Heart Rate (Exit) 112 bpm    Rating of Perceived Exertion (Exercise) 15    Symptoms none    Duration Continue with 30 min of aerobic exercise without signs/symptoms of physical distress.    Intensity THRR unchanged      Progression   Progression Continue to progress workloads to maintain intensity without signs/symptoms of physical distress.    Average METs 7.19      Resistance Training   Training Prescription Yes    Weight 7 lb    Reps 10-15      Interval Training   Interval Training No      Treadmill   MPH 3.7    Grade 3    Minutes 15    METs 5.36      Recumbant Elliptical   Level 6    Minutes 15    METs 5.5      REL-XR   Level 10  Minutes 15    METs 10.7      Home Exercise Plan   Plans to continue exercise at Home (comment)   walking in neighborhood and TM at home on off days of rehab   Frequency Add 2 additional days to program exercise sessions.    Initial Home Exercises Provided 04/16/20           Nutrition:  Target Goals: Understanding of nutrition guidelines, daily intake of sodium '1500mg'$ , cholesterol '200mg'$ , calories 30% from fat and 7% or less from saturated fats, daily to have 5 or more servings of fruits and vegetables.  Education: Controlling Sodium/Reading Food Labels -Group verbal and written material supporting the discussion of sodium use in heart healthy nutrition. Review and explanation with models, verbal and written materials for utilization of the food label.   Cardiac Rehab from 10/31/2018 in [facility] Cardiac and Pulmonary Rehab  Date 10/31/18  Educator LB  Instruction Review Code 1- Verbalizes Understanding      Education: General Nutrition Guidelines/Fats and Fiber: -Group instruction provided by verbal, written  material, models and posters to present the general guidelines for heart healthy nutrition. Gives an explanation and review of dietary fats and fiber.   Cardiac Rehab from 10/31/2018 in [facility] Cardiac and Pulmonary Rehab  Date 10/29/18  Educator LB  Instruction Review Code 1- Verbalizes Understanding      Biometrics:  Pre Biometrics - 03/30/20 1627      Pre Biometrics   Height 5' 11.25" (1.81 m)    Weight 203 lb 6.4 oz (92.3 kg)    BMI (Calculated) 28.16    Single Leg Stand 30 seconds           Post Biometrics - 07/06/20 1751       Post  Biometrics   Height 5' 11.25" (1.81 m)    Weight 208 lb 14.4 oz (94.8 kg)    BMI (Calculated) 28.92    Single Leg Stand 30 seconds           Nutrition Therapy Plan and Nutrition Goals:  Nutrition Therapy & Goals - 04/14/20 1231      Nutrition Therapy   Diet HH, Low Na, DM    Drug/Food Interactions Purine/Gout    Protein (specify units) 75-80g    Fiber 30 grams    Whole Grain Foods 3 servings    Saturated Fats 12 max. grams    Fruits and Vegetables 8 servings/day    Sodium 1.5 grams      Personal Nutrition Goals   Nutrition Goal LT: maintain HH lifestyle under supervision    Comments Predominatley whole food plant based diet with some meals from the meal service purple carrot as well as calcium supplements and fish 2x/week. Pt taking B12 supplement. Reviewed HH eating, vitamin D recommendations, variety, and protein.      Intervention Plan   Intervention Prescribe, educate and counsel regarding individualized specific dietary modifications aiming towards targeted core components such as weight, hypertension, lipid management, diabetes, heart failure and other comorbidities.    Expected Outcomes Short Term Goal: Understand basic principles of dietary content, such as calories, fat, sodium, cholesterol and nutrients.;Short Term Goal: A plan has been developed with personal nutrition goals set during dietitian appointment.;Long Term Goal:  Adherence to prescribed nutrition plan.           Nutrition Assessments:  Nutrition Assessments - 07/09/20 1722      MEDFICTS Scores   Post Score 0  MEDIFICTS Score Key:          ?70 Need to make dietary changes          40-70 Heart Healthy Diet         ? 40 Therapeutic Level Cholesterol Diet  Nutrition Goals Re-Evaluation:  Nutrition Goals Re-Evaluation    [place] Name 04/20/20 1700 06/15/20 1711           Goals   Nutrition Goal LT: maintain [place] lifestyle under supervision LT: maintain [place] lifestyle under supervision      Comment Pt would like to continue with current heart healthy changes and mostly vegan diet, no questions at this time. Pt would like to continue with current heart healthy changes and mostly vegan diet, no questions at this time.      Expected Outcome LT: maintain [place] lifestyle under supervision LT: maintain weight and heart health             Nutrition Goals Discharge (Final Nutrition Goals Re-Evaluation):  Nutrition Goals Re-Evaluation - 06/15/20 1711      Goals   Nutrition Goal LT: maintain [place] lifestyle under supervision    Comment Pt would like to continue with current heart healthy changes and mostly vegan diet, no questions at this time.    Expected Outcome LT: maintain weight and heart health           Psychosocial: Target Goals: Acknowledge presence or absence of significant depression and/or stress, maximize coping skills, provide positive support system. Participant is able to verbalize types and ability to use techniques and skills needed for reducing stress and depression.   Education: Depression - Provides group verbal and written instruction on the correlation between heart/lung disease and depressed mood, treatment options, and the stigmas associated with seeking treatment.   Education: Sleep Hygiene -Provides group verbal and written instruction about how sleep can affect your health.  Define sleep hygiene, discuss sleep  cycles and impact of sleep habits. Review good sleep hygiene tips.     Education: Stress and Anxiety: - Provides group verbal and written instruction about the health risks of elevated stress and causes of high stress.  Discuss the correlation between heart/lung disease and anxiety and treatment options. Review healthy ways to manage with stress and anxiety.   Cardiac Rehab from 10/31/2018 in [facility] Cardiac and Pulmonary Rehab  Date 10/10/18  Educator Lucianne Lei, MSW  Instruction Review Code 1- Verbalizes Understanding       Initial Review & Psychosocial Screening:  Initial Psych Review & Screening - 03/19/20 0944      Initial Review   Current issues with None Identified      Family Dynamics   Good Support System? Yes    Comments He can look to his parents, brother and wife for support.      Barriers   Psychosocial barriers to participate in program There are no identifiable barriers or psychosocial needs.;The patient should benefit from training in stress management and relaxation.      Screening Interventions   Interventions Encouraged to exercise;Program counselor consult;To provide support and resources with identified psychosocial needs;Provide feedback about the scores to participant    Expected Outcomes Short Term goal: Utilizing psychosocial counselor, staff and physician to assist with identification of specific Stressors or current issues interfering with healing process. Setting desired goal for each stressor or current issue identified.;Long Term Goal: Stressors or current issues are controlled or eliminated.;Short Term goal: Identification and review with participant of any Quality of Life or  Depression concerns found by scoring the questionnaire.;Long Term goal: The participant improves quality of Life and PHQ9 Scores as seen by post scores and/or verbalization of changes           Quality of Life Scores:   Quality of Life - 07/09/20 1721      Quality of Life    Select Quality of Life      Quality of Life Scores   Health/Function Pre 20.13 %    Health/Function Post 22.57 %    Health/Function % Change 12.12 %    Socioeconomic Pre 22.71 %    Socioeconomic Post 19.81 %    Socioeconomic % Change  -12.77 %    Psych/Spiritual Pre 22.14 %    Psych/Spiritual Post 22.21 %    Psych/Spiritual % Change 0.32 %    Family Pre 29.5 %    Family Post 23.4 %    Family % Change -20.68 %    GLOBAL Pre 22.46 %    GLOBAL Post 21.99 %    GLOBAL % Change -2.09 %          Scores of 19 and below usually indicate a poorer quality of life in these areas.  A difference of  2-3 points is a clinically meaningful difference.  A difference of 2-3 points in the total score of the Quality of Life Index has been associated with significant improvement in overall quality of life, self-image, physical symptoms, and general health in studies assessing change in quality of life.  PHQ-9: Recent Review Flowsheet Data    Depression screen [facility] 2/9 03/30/2020 03/04/2020 01/06/2020 12/23/2019 03/28/2019   Decreased Interest 0 0 0 0 0   Down, Depressed, Hopeless 0 0 0 0 0   PHQ - 2 Score 0 0 0 0 0   Altered sleeping 0 0 0 0 0   Tired, decreased energy 1 0 1 1 0   Change in appetite 0 0 1 1 0   Feeling bad or failure about yourself  0 0 0 0 0   Trouble concentrating 0 0 0 0 0   Moving slowly or fidgety/restless 0 0 0 0 0   Suicidal thoughts 0 0 0 0 0   PHQ-9 Score 1 0 2 2 0   Difficult doing work/chores Somewhat difficult - Not difficult at all Not difficult at all Not difficult at all     Interpretation of Total Score  Total Score Depression Severity:  1-4 = Minimal depression, 5-9 = Mild depression, 10-14 = Moderate depression, 15-19 = Moderately severe depression, 20-27 = Severe depression   Psychosocial Evaluation and Intervention:  Psychosocial Evaluation - 03/19/20 0949      Psychosocial Evaluation & Interventions   Interventions Encouraged to exercise with the program and  follow exercise prescription    Comments John Hartman has done the program before and has a positive outlook on his health    Expected Outcomes Short: Attend [facility] stress management education to decrease stress. Long: Maintain exercise Post Heart[facility] to keep stress at a minimum.    Continue Psychosocial Services  Follow up required by staff           Psychosocial Re-Evaluation:  Psychosocial Re-Evaluation    [place] Name 04/30/20 1727 06/15/20 1709           Psychosocial Re-Evaluation   Current issues with Current Stress Concerns Current Stress Concerns      Comments John Hartman reports  that he is settled in his new house and is  sleeping well. No new stress concerns reported. He did recently do a sleep study which showed that he does have sleep apnea and he is going to be getting a C-pap machine soon to help with this. He does say he feels well rested in the morning and has energy during the day. John Hartman reports no new stress concerns.  He doesnt have his CPAP yet but does sleep at a slight incline.  he feels well rested and has energy.      Expected Outcomes Short: Get and start using C-pap machine to help with sleep apnea. Long: Maintain good mental health habits. Short: continue good sleep habits Long: maintain positive outlook      Interventions Encouraged to attend Cardiac Rehabilitation for the exercise --      Continue Psychosocial Services  Follow up required by staff --             Psychosocial Discharge (Final Psychosocial Re-Evaluation):  Psychosocial Re-Evaluation - 06/15/20 1709      Psychosocial Re-Evaluation   Current issues with Current Stress Concerns    Comments John Hartman reports no new stress concerns.  He doesnt have his CPAP yet but does sleep at a slight incline.  he feels well rested and has energy.    Expected Outcomes Short: continue good sleep habits Long: maintain positive outlook           Vocational Rehabilitation: Provide vocational rehab assistance to qualifying  candidates.   Vocational Rehab Evaluation & Intervention:   Education: Education Goals: Education classes will be provided on a variety of topics geared toward better understanding of heart health and risk factor modification. Participant will state understanding/return demonstration of topics presented as noted by education test scores.  Learning Barriers/Preferences:  Learning Barriers/Preferences - 03/19/20 0947      Learning Barriers/Preferences   Learning Barriers None    Learning Preferences None           General Cardiac Education Topics:  AED/CPR: - Group verbal and written instruction with the use of models to demonstrate the basic use of the AED with the basic ABC's of resuscitation.   Cardiac Rehab from 10/31/2018 in [facility] Cardiac and Pulmonary Rehab  Date 10/24/18  Educator CE  Instruction Review Code 1- Verbalizes Understanding      Anatomy & Physiology of the Heart: - Group verbal and written instruction and models provide basic cardiac anatomy and physiology, with the coronary electrical and arterial systems. Review of Valvular disease and Heart Failure   Cardiac Rehab from 10/31/2018 in [facility] Cardiac and Pulmonary Rehab  Date 10/08/18  Educator MA  Instruction Review Code 1- Verbalizes Understanding      Cardiac Procedures: - Group verbal and written instruction to review commonly prescribed medications for heart disease. Reviews the medication, class of the drug, and side effects. Includes the steps to properly store meds and maintain the prescription regimen. (beta blockers and nitrates)   Cardiac Rehab from 10/31/2018 in [facility] Cardiac and Pulmonary Rehab  Date 10/22/18  Educator CE  Instruction Review Code 1- Verbalizes Understanding      Cardiac Medications I: - Group verbal and written instruction to review commonly prescribed medications for heart disease. Reviews the medication, class of the drug, and side effects. Includes the steps to properly store  meds and maintain the prescription regimen.   Cardiac Rehab from 10/31/2018 in [facility] Cardiac and Pulmonary Rehab  Date 10/15/18  Educator CE  Instruction Review Code 1- Verbalizes Understanding  Cardiac Medications II: -Group verbal and written instruction to review commonly prescribed medications for heart disease. Reviews the medication, class of the drug, and side effects. (all other drug classes)   Cardiac Rehab from 10/31/2018 in [facility] Cardiac and Pulmonary Rehab  Date 10/03/18  Educator C.Enterkin, RN  Instruction Review Code 1- Verbalizes Understanding       Go Sex-Intimacy & Heart Disease, Get SMART - Goal Setting: - Group verbal and written instruction through game format to discuss heart disease and the return to sexual intimacy. Provides group verbal and written material to discuss and apply goal setting through the application of the S.M.A.R.T. Method.   Cardiac Rehab from 10/31/2018 in [facility] Cardiac and Pulmonary Rehab  Date 10/22/18  Educator CE  Instruction Review Code 1- Verbalizes Understanding      Other Matters of the Heart: - Provides group verbal, written materials and models to describe Stable Angina and Peripheral Artery. Includes description of the disease process and treatment options available to the cardiac patient.   Infection Prevention: - Provides verbal and written material to individual with discussion of infection control including proper hand washing and proper equipment cleaning during exercise session.   Cardiac Rehab from 07/08/2020 in [facility] Cardiac and Pulmonary Rehab  Date 03/30/20  Educator AS  Instruction Review Code 1- Verbalizes Understanding      Falls Prevention: - Provides verbal and written material to individual with discussion of falls prevention and safety.   Cardiac Rehab from 07/08/2020 in [facility] Cardiac and Pulmonary Rehab  Date 03/30/20  Educator AS  Instruction Review Code 1- Verbalizes Understanding       Other: -Provides group and verbal instruction on various topics (see comments)   Knowledge Questionnaire Score:  Knowledge Questionnaire Score - 07/09/20 1721      Knowledge Questionnaire Score   Post Score 25/26           Core Components/Risk Factors/Patient Goals at Admission:  Personal Goals and Risk Factors at Admission - 03/30/20 1625      Core Components/Risk Factors/Patient Goals on Admission    Weight Management Yes;Weight Maintenance    Intervention Weight Management: Develop a combined nutrition and exercise program designed to reach desired caloric intake, while maintaining appropriate intake of nutrient and fiber, sodium and fats, and appropriate energy expenditure required for the weight goal.;Weight Management: Provide education and appropriate resources to help participant work on and attain dietary goals.    Admit Weight 203 lb 6.4 oz (92.3 kg)    Goal Weight: Short Term 200 lb (90.7 kg)    Goal Weight: Long Term 200 lb (90.7 kg)    Expected Outcomes Short Term: Continue to assess and modify interventions until short term weight is achieved;Long Term: Adherence to nutrition and physical activity/exercise program aimed toward attainment of established weight goal;Weight Maintenance: Understanding of the daily nutrition guidelines, which includes 25-35% calories from fat, 7% or less cal from saturated fats, less than 200mg  cholesterol, less than 1.5gm of sodium, & 5 or more servings of fruits and vegetables daily;Weight Loss: Understanding of general recommendations for a balanced deficit meal plan, which promotes 1-2 lb weight loss per week and includes a negative energy balance of [phone] kcal/d;Understanding recommendations for meals to include 15-35% energy as protein, 25-35% energy from fat, 35-60% energy from carbohydrates, less than 200mg  of dietary cholesterol, 20-35 gm of total fiber daily;Understanding of distribution of calorie intake throughout the day with the  consumption of 4-5 meals/snacks    Diabetes Yes  Intervention Provide education about signs/symptoms and action to take for hypo/hyperglycemia.;Provide education about proper nutrition, including hydration, and aerobic/resistive exercise prescription along with prescribed medications to achieve blood glucose in normal ranges: Fasting glucose 65-99 mg/dL    Expected Outcomes Short Term: Participant verbalizes understanding of the signs/symptoms and immediate care of hyper/hypoglycemia, proper foot care and importance of medication, aerobic/resistive exercise and nutrition plan for blood glucose control.;Long Term: Attainment of HbA1C < 7%.    Intervention Provide a combined exercise and nutrition program that is supplemented with education, support and counseling about heart failure. Directed toward relieving symptoms such as shortness of breath, decreased exercise tolerance, and extremity edema.    Expected Outcomes Improve functional capacity of life;Short term: Attendance in program 2-3 days a week with increased exercise capacity. Reported lower sodium intake. Reported increased fruit and vegetable intake. Reports medication compliance.;Long term: Adoption of self-care skills and reduction of barriers for early signs and symptoms recognition and intervention leading to self-care maintenance.;Short term: Daily weights obtained and reported for increase. Utilizing diuretic protocols set by physician.    Intervention Provide education on lifestyle modifcations including regular physical activity/exercise, weight management, moderate sodium restriction and increased consumption of fresh fruit, vegetables, and low fat dairy, alcohol moderation, and smoking cessation.;Monitor prescription use compliance.    Intervention Provide education and support for participant on nutrition & aerobic/resistive exercise along with prescribed medications to achieve LDL '70mg'$ , HDL >'40mg'$ .    Expected Outcomes Short Term:  Participant states understanding of desired cholesterol values and is compliant with medications prescribed. Participant is following exercise prescription and nutrition guidelines.;Long Term: Cholesterol controlled with medications as prescribed, with individualized exercise RX and with personalized nutrition plan. Value goals: LDL < '70mg'$ , HDL > 40 mg.           Education:Diabetes - Individual verbal and written instruction to review signs/symptoms of diabetes, desired ranges of glucose level fasting, after meals and with exercise. Acknowledge that pre and post exercise glucose checks will be done for 3 sessions at entry of program.   Cardiac Rehab from 03/19/2020 in [facility] Cardiac and Pulmonary Rehab  Date 03/19/20  Educator [facility]  Instruction Review Code 1- Verbalizes Understanding      Education: Know Your Numbers and Risk Factors: -Group verbal and written instruction about important numbers in your health.  Discussion of what are risk factors and how they play a role in the disease process.  Review of Cholesterol, Blood Pressure, Diabetes, and BMI and the role they play in your overall health.   Cardiac Rehab from 10/31/2018 in [facility] Cardiac and Pulmonary Rehab  Date 10/03/18  Educator C.Enterkin, RN  Instruction Review Code 1- Verbalizes Understanding      Core Components/Risk Factors/Patient Goals Review:   Goals and Risk Factor Review    Row Name 04/30/20 1716 06/15/20 1705 06/15/20 1735         Core Components/Risk Factors/Patient Goals Review   Personal Goals Review Weight Management/Obesity;Lipids;Hypertension;Stress;Diabetes Weight Management/Obesity;Lipids;Hypertension;Stress;Diabetes --     Review John Hartman reports taking all medications as prescribed. He checks his blood sugar several times a day. Blood pressure and lipids are reported to be under control. He is maintaining weight and continues to mainly eat a vegan diet. He cooks mostly at home.  He decided to buy a TM to have at  home when walking outside is not possible. John Hartman is just back from vacation.  He is taking meds as directed.  He is mainly eating plant based diet and maintaining weight.  He walked 10k steps per day while on vacation. He has enjoyed going throught the program and feels he has worked harder than he would on his own.     Expected Outcomes Short: Continue to attend cardiac rehab with regular attendence and continue to monitor BG and take all meds. Long: Continue to control risk factors with heart healthy lifestyle. Short: continue to exercise consistently and maintain healthy diet  Long:  maintain healthy habits --            Core Components/Risk Factors/Patient Goals at Discharge (Final Review):   Goals and Risk Factor Review - 06/15/20 1735      Core Components/Risk Factors/Patient Goals Review   Review He has enjoyed going throught the program and feels he has worked harder than he would on his own.           ITP Comments:  ITP Comments    Row Name 03/19/20 0952 04/06/20 1719 04/14/20 1243 04/15/20 0515 05/13/20 0537   ITP Comments Virtual Orientation performed. Patient informed when to come in for RD and EP orientation. Diagnosis can be found in [facility] 03/04/2020. First full day of exercise!  Patient was oriented to gym and equipment including functions, settings, policies, and procedures.  Patient's individual exercise prescription and treatment plan were reviewed.  All starting workloads were established based on the results of the 6 minute walk test done at initial orientation visit.  The plan for exercise progression was also introduced and progression will be customized based on patient's performance and goals. Completed Initial RD Eval 30 Day review completed. Medical Director review done, changes made as directed,and approval shown by signature of Market researcher. 30 Day review completed. ITP review done, changes made as directed,and approval shown by signature of  Careers adviser.   [place] Name 06/10/20 0533 07/08/20 0629 07/09/20 1715       ITP Comments 30 Day review completed. Medical Director ITP review done, changes made as directed, and signed approval by Medical Director. 30 Day review completed. Medical Director ITP review done, changes made as directed, and signed approval by Medical Director. John Hartman graduated today from  rehab with 32 sessions completed.  Details of the patient's exercise prescription and what He needs to do in order to continue the prescription and progress were discussed with patient.  Patient was given a copy of prescription and goals.  Patient verbalized understanding.  John Hartman plans to continue to exercise by walking at home.            Comments: Discharge ITP

## 2020-07-09 NOTE — Patient Instructions (Signed)
Discharge Patient Instructions  Patient Details  Name: John Hartman MRN: [id number] Date of Birth: 1971-01-20 Referring Provider:  Isaias Cowman, MD   Number of Visits: 49  Reason for Discharge:  Patient reached a stable level of exercise. Patient independent in their exercise. Patient has met program and personal goals.  Smoking History:  Social History   Tobacco Use  Smoking Status Former Smoker  . Packs/day: 1.00  . Years: 10.00  . Pack years: 10.00  . Types: Cigarettes  . Quit date: 04/15/2003  . Years since quitting: 17.2  Smokeless Tobacco Never Used    Diagnosis:  NSTEMI (non-ST elevated myocardial infarction) ([place])  Status post coronary artery stent placement  Initial Exercise Prescription:  Initial Exercise Prescription - 03/30/20 1600      Date of Initial Exercise RX and Referring Provider   Date 03/30/20    Referring Provider Paraschos      Treadmill   MPH 3.6    Grade 1    Minutes 15    METs 4.25      Elliptical   Level 1    Speed 3    Minutes 15    METs 4      REL-XR   Level 4    Speed 50    Minutes 15    METs 4      Prescription Details   Frequency (times per week) 3    Duration Progress to 30 minutes of continuous aerobic without signs/symptoms of physical distress      Intensity   THRR 40-80% of Max Heartrate 125-157    Ratings of Perceived Exertion 11-15    Perceived Dyspnea 0-4      Resistance Training   Training Prescription Yes    Weight 3 lb    Reps 10-15           Discharge Exercise Prescription (Final Exercise Prescription Changes):  Exercise Prescription Changes - 07/06/20 1300      Response to Exercise   Blood Pressure (Admit) 100/70    Blood Pressure (Exercise) 146/70    Blood Pressure (Exit) 108/68    Heart Rate (Admit) 54 bpm    Heart Rate (Exercise) 126 bpm    Heart Rate (Exit) 112 bpm    Rating of Perceived Exertion (Exercise) 15    Symptoms none    Duration Continue with 30 min of aerobic  exercise without signs/symptoms of physical distress.    Intensity THRR unchanged      Progression   Progression Continue to progress workloads to maintain intensity without signs/symptoms of physical distress.    Average METs 7.19      Resistance Training   Training Prescription Yes    Weight 7 lb    Reps 10-15      Interval Training   Interval Training No      Treadmill   MPH 3.7    Grade 3    Minutes 15    METs 5.36      Recumbant Elliptical   Level 6    Minutes 15    METs 5.5      REL-XR   Level 10    Minutes 15    METs 10.7      Home Exercise Plan   Plans to continue exercise at Home (comment)   walking in neighborhood and TM at home on off days of rehab   Frequency Add 2 additional days to program exercise sessions.    Initial Home Exercises Provided 04/16/20  Functional Capacity:  6 Minute Walk    Row Name 03/30/20 1619 07/06/20 1752       6 Minute Walk   Phase Initial Discharge    Distance 1905 feet 2025 feet    Distance % Change -- 6.3 %    Distance Feet Change -- 120 ft    Walk Time 6 minutes 6 minutes    # of Rest Breaks 0 0    MPH 3.6 3.84    METS 4.25 5.89    RPE 12 13    Perceived Dyspnea  1 --    VO2 Peak 14.9 20.63    Symptoms No No    Resting HR 93 bpm 95 bpm    Resting BP 114/76 108/70    Resting Oxygen Saturation  97 % --    Exercise Oxygen Saturation  during 6 min walk 97 % --    Max Ex. HR 129 bpm 142 bpm    Max Ex. BP 128/74 140/80    2 Minute Post BP 120/68 --           Quality of Life:  Quality of Life - 07/09/20 1721      Quality of Life   Select Quality of Life      Quality of Life Scores   Health/Function Pre 20.13 %    Health/Function Post 22.57 %    Health/Function % Change 12.12 %    Socioeconomic Pre 22.71 %    Socioeconomic Post 19.81 %    Socioeconomic % Change  -12.77 %    Psych/Spiritual Pre 22.14 %    Psych/Spiritual Post 22.21 %    Psych/Spiritual % Change 0.32 %    Family Pre 29.5 %     Family Post 23.4 %    Family % Change -20.68 %    GLOBAL Pre 22.46 %    GLOBAL Post 21.99 %    GLOBAL % Change -2.09 %           Personal Goals: Goals established at orientation with interventions provided to work toward goal.  Personal Goals and Risk Factors at Admission - 03/30/20 1625      Core Components/Risk Factors/Patient Goals on Admission    Weight Management Yes;Weight Maintenance    Intervention Weight Management: Develop a combined nutrition and exercise program designed to reach desired caloric intake, while maintaining appropriate intake of nutrient and fiber, sodium and fats, and appropriate energy expenditure required for the weight goal.;Weight Management: Provide education and appropriate resources to help participant work on and attain dietary goals.    Admit Weight 203 lb 6.4 oz (92.3 kg)    Goal Weight: Short Term 200 lb (90.7 kg)    Goal Weight: Long Term 200 lb (90.7 kg)    Expected Outcomes Short Term: Continue to assess and modify interventions until short term weight is achieved;Long Term: Adherence to nutrition and physical activity/exercise program aimed toward attainment of established weight goal;Weight Maintenance: Understanding of the daily nutrition guidelines, which includes 25-35% calories from fat, 7% or less cal from saturated fats, less than 247m cholesterol, less than 1.5gm of sodium, & 5 or more servings of fruits and vegetables daily;Weight Loss: Understanding of general recommendations for a balanced deficit meal plan, which promotes 1-2 lb weight loss per week and includes a negative energy balance of [phone] kcal/d;Understanding recommendations for meals to include 15-35% energy as protein, 25-35% energy from fat, 35-60% energy from carbohydrates, less than 2048mof dietary cholesterol, 20-35 gm of total  fiber daily;Understanding of distribution of calorie intake throughout the day with the consumption of 4-5 meals/snacks    Diabetes Yes     Intervention Provide education about signs/symptoms and action to take for hypo/hyperglycemia.;Provide education about proper nutrition, including hydration, and aerobic/resistive exercise prescription along with prescribed medications to achieve blood glucose in normal ranges: Fasting glucose 65-99 mg/dL    Expected Outcomes Short Term: Participant verbalizes understanding of the signs/symptoms and immediate care of hyper/hypoglycemia, proper foot care and importance of medication, aerobic/resistive exercise and nutrition plan for blood glucose control.;Long Term: Attainment of HbA1C < 7%.    Intervention Provide a combined exercise and nutrition program that is supplemented with education, support and counseling about heart failure. Directed toward relieving symptoms such as shortness of breath, decreased exercise tolerance, and extremity edema.    Expected Outcomes Improve functional capacity of life;Short term: Attendance in program 2-3 days a week with increased exercise capacity. Reported lower sodium intake. Reported increased fruit and vegetable intake. Reports medication compliance.;Long term: Adoption of self-care skills and reduction of barriers for early signs and symptoms recognition and intervention leading to self-care maintenance.;Short term: Daily weights obtained and reported for increase. Utilizing diuretic protocols set by physician.    Intervention Provide education on lifestyle modifcations including regular physical activity/exercise, weight management, moderate sodium restriction and increased consumption of fresh fruit, vegetables, and low fat dairy, alcohol moderation, and smoking cessation.;Monitor prescription use compliance.    Intervention Provide education and support for participant on nutrition & aerobic/resistive exercise along with prescribed medications to achieve LDL <16m, HDL >456m    Expected Outcomes Short Term: Participant states understanding of desired cholesterol  values and is compliant with medications prescribed. Participant is following exercise prescription and nutrition guidelines.;Long Term: Cholesterol controlled with medications as prescribed, with individualized exercise RX and with personalized nutrition plan. Value goals: LDL < 7067mHDL > 40 mg.            Personal Goals Discharge:  Goals and Risk Factor Review - 06/15/20 1735      Core Components/Risk Factors/Patient Goals Review   Review He has enjoyed going throught the program and feels he has worked harder than he would on his own.           Exercise Goals and Review:  Exercise Goals    Row Name 03/30/20 1626             Exercise Goals   Increase Physical Activity Yes       Intervention Provide advice, education, support and counseling about physical activity/exercise needs.;Develop an individualized exercise prescription for aerobic and resistive training based on initial evaluation findings, risk stratification, comorbidities and participant's personal goals.       Expected Outcomes Short Term: Attend rehab on a regular basis to increase amount of physical activity.;Long Term: Add in home exercise to make exercise part of routine and to increase amount of physical activity.;Long Term: Exercising regularly at least 3-5 days a week.       Increase Strength and Stamina Yes       Intervention Provide advice, education, support and counseling about physical activity/exercise needs.;Develop an individualized exercise prescription for aerobic and resistive training based on initial evaluation findings, risk stratification, comorbidities and participant's personal goals.       Expected Outcomes Short Term: Increase workloads from initial exercise prescription for resistance, speed, and METs.;Short Term: Perform resistance training exercises routinely during rehab and add in resistance training at home;Long Term: Improve cardiorespiratory fitness, muscular  endurance and strength as  measured by increased METs and functional capacity (6MWT)       Able to understand and use rate of perceived exertion (RPE) scale Yes       Intervention Provide education and explanation on how to use RPE scale       Expected Outcomes Short Term: Able to use RPE daily in rehab to express subjective intensity level;Long Term:  Able to use RPE to guide intensity level when exercising independently       Able to understand and use Dyspnea scale Yes       Intervention Provide education and explanation on how to use Dyspnea scale       Expected Outcomes Short Term: Able to use Dyspnea scale daily in rehab to express subjective sense of shortness of breath during exertion;Long Term: Able to use Dyspnea scale to guide intensity level when exercising independently       Knowledge and understanding of Target Heart Rate Range (THRR) Yes       Intervention Provide education and explanation of THRR including how the numbers were predicted and where they are located for reference       Expected Outcomes Short Term: Able to state/look up THRR;Short Term: Able to use daily as guideline for intensity in rehab;Long Term: Able to use THRR to govern intensity when exercising independently       Able to check pulse independently Yes       Intervention Provide education and demonstration on how to check pulse in carotid and radial arteries.;Review the importance of being able to check your own pulse for safety during independent exercise       Expected Outcomes Short Term: Able to explain why pulse checking is important during independent exercise;Long Term: Able to check pulse independently and accurately       Understanding of Exercise Prescription Yes       Intervention Provide education, explanation, and written materials on patient's individual exercise prescription       Expected Outcomes Short Term: Able to explain program exercise prescription;Long Term: Able to explain home exercise prescription to exercise  independently              Exercise Goals Re-Evaluation:  Exercise Goals Re-Evaluation    Row Name 04/06/20 1721 04/15/20 1108 04/16/20 1715 04/28/20 1444 04/30/20 1711     Exercise Goal Re-Evaluation   Exercise Goals Review Increase Physical Activity;Able to understand and use rate of perceived exertion (RPE) scale;Knowledge and understanding of Target Heart Rate Range (THRR);Understanding of Exercise Prescription;Increase Strength and Stamina;Able to check pulse independently Increase Physical Activity;Increase Strength and Stamina;Understanding of Exercise Prescription Increase Physical Activity;Increase Strength and Stamina;Understanding of Exercise Prescription;Able to understand and use rate of perceived exertion (RPE) scale;Knowledge and understanding of Target Heart Rate Range (THRR);Able to check pulse independently Increase Physical Activity;Increase Strength and Stamina;Able to understand and use rate of perceived exertion (RPE) scale;Able to understand and use Dyspnea scale;Knowledge and understanding of Target Heart Rate Range (THRR);Able to check pulse independently;Understanding of Exercise Prescription Increase Physical Activity;Increase Strength and Stamina;Able to understand and use rate of perceived exertion (RPE) scale;Able to understand and use Dyspnea scale;Knowledge and understanding of Target Heart Rate Range (THRR);Able to check pulse independently;Understanding of Exercise Prescription   Comments Reviewed RPE scale, THR and program prescription with pt today.  Pt voiced understanding and was given a copy of goals to take home. [name] is off to a good start in rehab.  He is already doing  well on the elliptical and up to level 6 on the XR.  We will continue to monitor his progress. Reviewed home exercise with pt today.  Pt plans to walk on TM and in neighborhood for exercise.  Reviewed THR, pulse, RPE, sign and symptoms, NTG use, and when to call 911 or MD.  Also discussed weather  considerations and indoor options.  Pt voiced understanding. [name] is progressing well.  He works in correct HR and RPE ranges.  He has moved up to 7 lb for strength work. [name] has shown increases in workloads during rehab sessions. He has increased his elevation to 1.5% on the TM. The recumbant elliptical is tolerated better then the upright ellipticial due to back pain. He continues to make progress and increase in strength and stamina. [name] also reports walking regularly at home.   Expected Outcomes Short: Use RPE daily to regulate intensity. Long: Follow program prescription in THR. Short: Continue to improve workloads  Long: Continue to follow program prescription Short: Add 1-2 days of exercise at home on off days of cardiac rehab. Long: Become independent with exercise routine. Short:  continue to follow program prescription Long: increase overall MET level Short: Increase elevation on TM to 2% Long: Continue with independent exercise routine and increase strength and stamina.   [place] Name 05/11/20 1447 05/26/20 1601 06/10/20 1452 06/15/20 1708 06/25/20 0922     Exercise Goal Re-Evaluation   Exercise Goals Review Increase Physical Activity;Increase Strength and Stamina;Understanding of Exercise Prescription Increase Physical Activity;Increase Strength and Stamina;Understanding of Exercise Prescription Increase Physical Activity;Increase Strength and Stamina;Understanding of Exercise Prescription Increase Physical Activity;Increase Strength and Stamina;Understanding of Exercise Prescription Increase Physical Activity;Increase Strength and Stamina;Understanding of Exercise Prescription   Comments [name] continues to do well in rehab.  He is enjoying being back in class again.  We have rotated him off the upright elliptical with his back.  We will continue to encourage him to increase elevation on treadmill and workload on recumbent elliptical.  We will continue to monitor his progress. [name] is progressing  well and is at level 8 on the XR.  He attends consistently and works in [employer] range. [name] is doing well in rehab.  He is up to level 9 on the XR now.  We will continue to encourage good attendance and monitor his progress. [name] just returned from vacation and was active the whole time doing more than 10k steps per day.  He has a TM at home as well. [name] continues to progress workloads and METS.  He works in Futures trader and RPE range.  Staff will monitor progress.   Expected Outcomes Short: Increase treadmill elevation  Long: Continue to improve stamina. Short: progress workloads on all equipment Long: improve overall MET level Short: Increase hand weights  Long: Continue to improve stamina. Short: continue consistent exercise Long: improve MET level Short: continue to progress workloads Long: maintain fitness gains   Row Name 07/06/20 1350             Exercise Goal Re-Evaluation   Exercise Goals Review Increase Physical Activity;Increase Strength and Stamina;Understanding of Exercise Prescription       Comments [name] is doing well in rehab.  Things are picking up at work and he would like to go ahead and graduate this week.  He should improve on his post 6MWT.  He plans to continue to exercise by walking and using his treadmill at home.       Expected Outcomes Short: Improve post  6MWT Long: Continue to exercise independently              Nutrition & Weight - Outcomes:  Pre Biometrics - 03/30/20 1627      Pre Biometrics   Height 5' 11.25" (1.81 m)    Weight 203 lb 6.4 oz (92.3 kg)    BMI (Calculated) 28.16    Single Leg Stand 30 seconds           Post Biometrics - 07/06/20 1751       Post  Biometrics   Height 5' 11.25" (1.81 m)    Weight 208 lb 14.4 oz (94.8 kg)    BMI (Calculated) 28.92    Single Leg Stand 30 seconds           Nutrition:  Nutrition Therapy & Goals - 04/14/20 1231      Nutrition Therapy   Diet HH, Low Na, DM    Drug/Food Interactions Purine/Gout     Protein (specify units) 75-80g    Fiber 30 grams    Whole Grain Foods 3 servings    Saturated Fats 12 max. grams    Fruits and Vegetables 8 servings/day    Sodium 1.5 grams      Personal Nutrition Goals   Nutrition Goal LT: maintain HH lifestyle under supervision    Comments Predominatley whole food plant based diet with some meals from the meal service purple carrot as well as calcium supplements and fish 2x/week. Pt taking B12 supplement. Reviewed HH eating, vitamin D recommendations, variety, and protein.      Intervention Plan   Intervention Prescribe, educate and counsel regarding individualized specific dietary modifications aiming towards targeted core components such as weight, hypertension, lipid management, diabetes, heart failure and other comorbidities.    Expected Outcomes Short Term Goal: Understand basic principles of dietary content, such as calories, fat, sodium, cholesterol and nutrients.;Short Term Goal: A plan has been developed with personal nutrition goals set during dietitian appointment.;Long Term Goal: Adherence to prescribed nutrition plan.           Nutrition Discharge:  Nutrition Assessments - 07/09/20 1722      MEDFICTS Scores   Post Score 0           Education Questionnaire Score:  Knowledge Questionnaire Score - 07/09/20 1721      Knowledge Questionnaire Score   Post Score 25/26           Goals reviewed with patient; copy given to patient.

## 2020-07-09 NOTE — Progress Notes (Signed)
Daily Session Note  Patient Details  Name: John Hartman MRN: [id number] Date of Birth: 12/08/1971 Referring Provider:     Cardiac Rehab from 03/30/2020 in [facility]  Referring Provider Paraschos      Encounter Date: 07/09/2020  Check In:  Session Check In - 07/09/20 1711      Check-In   Supervising physician immediately available to respond to emergencies See telemetry face sheet for immediately available ER MD    Location [facility]-Cardiac & Pulmonary Rehab    Staff Present Renita Papa, RN BSN;Joseph Lou Miner, [place] Exercise Physiologist;Laureen Owens Shark, [place], RRT, CPFT    Virtual Visit No    Medication changes reported     No    Fall or balance concerns reported    No    Warm-up and Cool-down Performed on first and last piece of equipment    Resistance Training Performed Yes    VAD Patient? No    PAD/SET Patient? No      Pain Assessment   Currently in Pain? No/denies              Social History   Tobacco Use  Smoking Status Former Smoker  . Packs/day: 1.00  . Years: 10.00  . Pack years: 10.00  . Types: Cigarettes  . Quit date: 04/15/2003  . Years since quitting: 17.2  Smokeless Tobacco Never Used    Goals Met:  Independence with exercise equipment Exercise tolerated well No report of cardiac concerns or symptoms Strength training completed today  Goals Unmet:  Not Applicable  Comments:  John Hartman graduated today from  rehab with 32 sessions completed.  Details of the patient's exercise prescription and what He needs to do in order to continue the prescription and progress were discussed with patient.  Patient was given a copy of prescription and goals.  Patient verbalized understanding.  John Hartman plans to continue to exercise by walking at home.    Dr. Emily Filbert is Medical Director for [name].

## 2020-07-15 ENCOUNTER — Encounter: Payer: Self-pay | Admitting: Urology

## 2020-09-12 IMAGING — US US RENAL
1 series · 14 of 25 positions shown · non-contrast
Comparison: CT of the abdomen pelvis dated 03/26/2019.

CLINICAL DATA: 48-year-old male with left flank pain. Recent left
ureteral stent placement.

EXAM:
RENAL / URINARY TRACT ULTRASOUND COMPLETE

[Series 1: us renal · 14 of 43 slices shown]
[im 1/43]
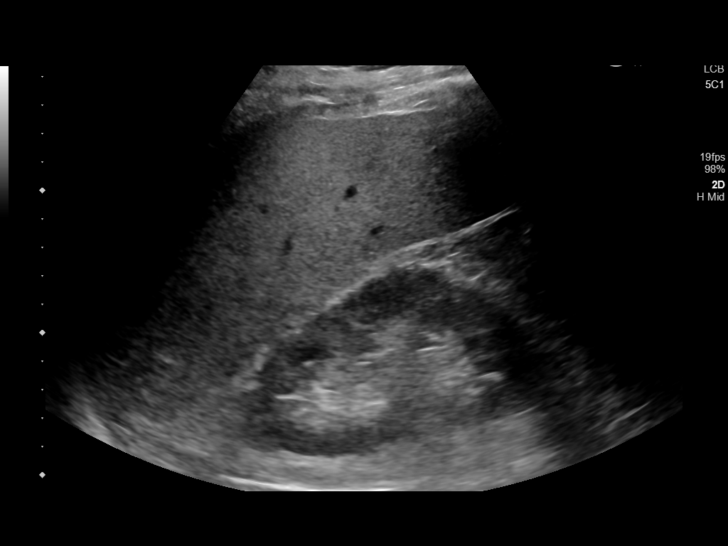
[im 4/43]
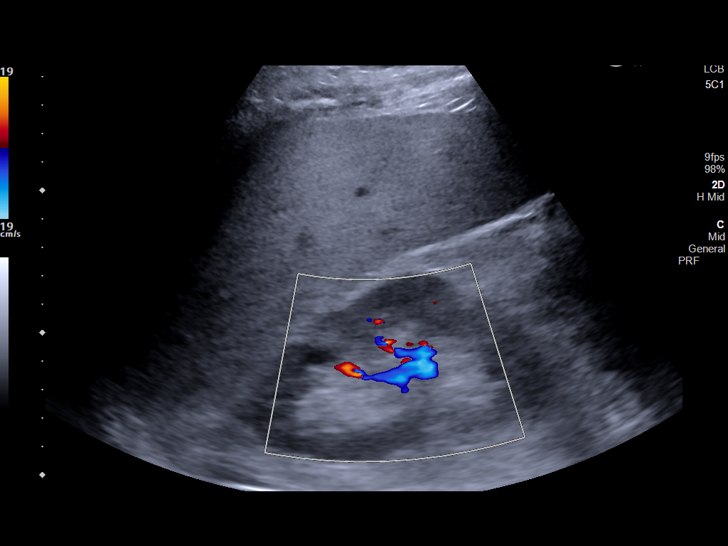
[im 8/43]
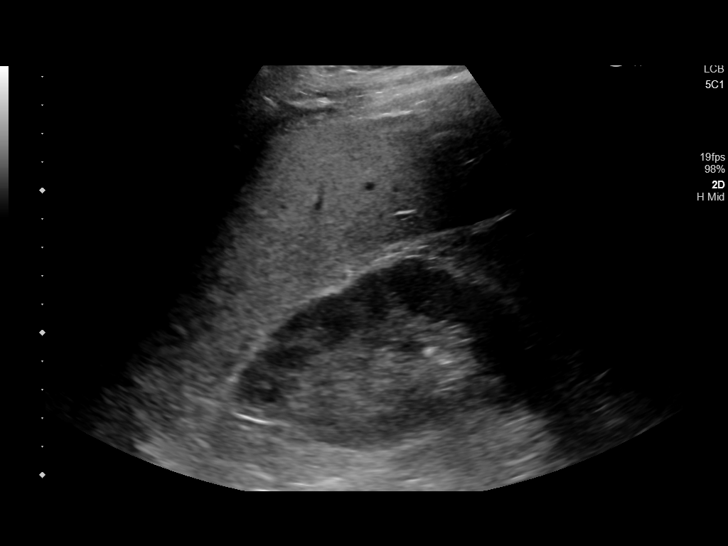
[im 11/43]
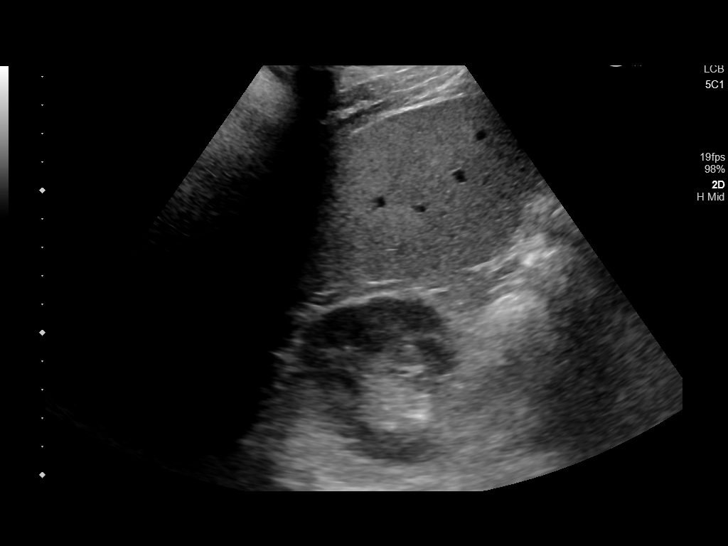
[im 15/43]
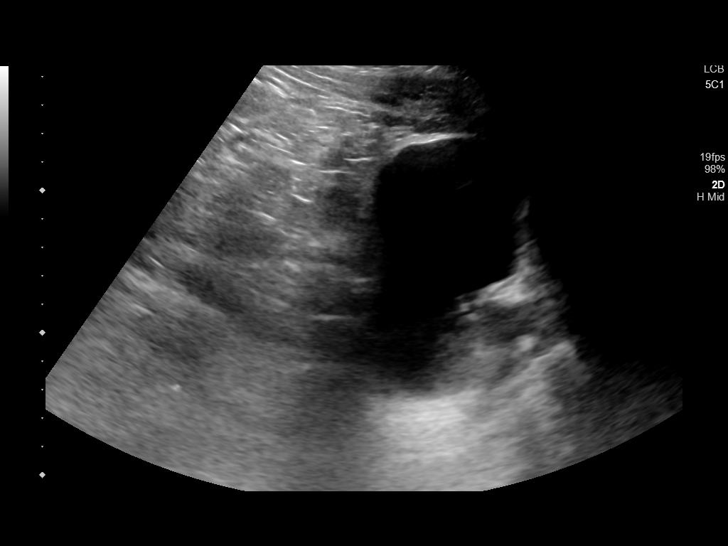
[im 16/43]
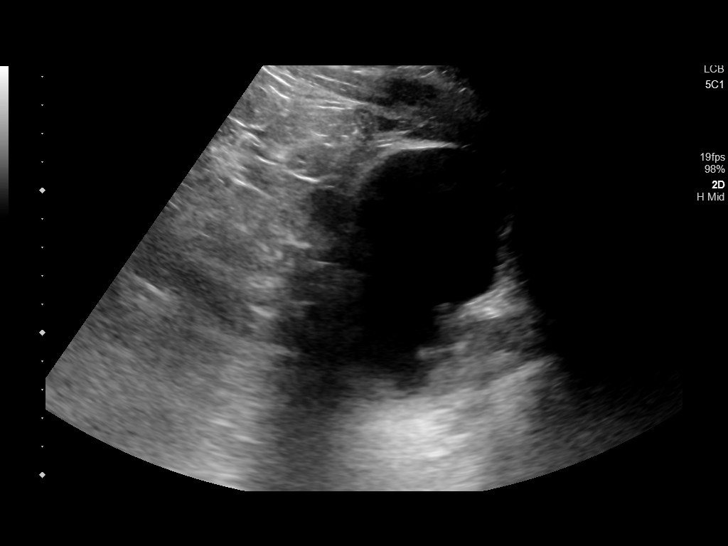
[im 20/43]
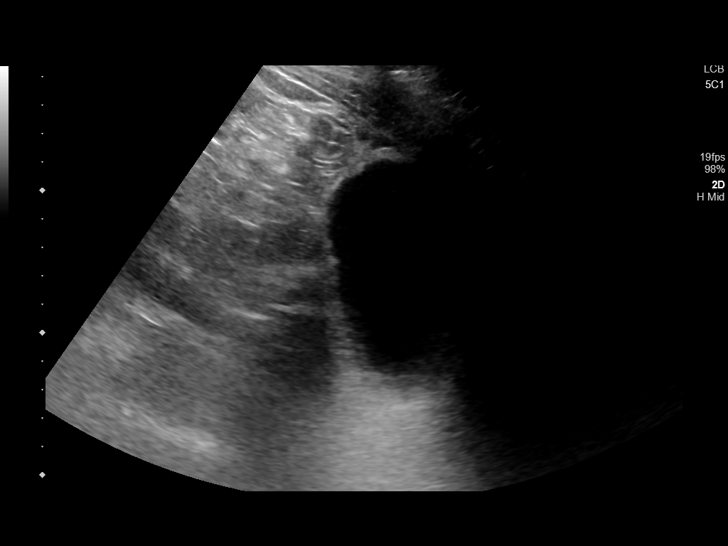
[im 23/43]
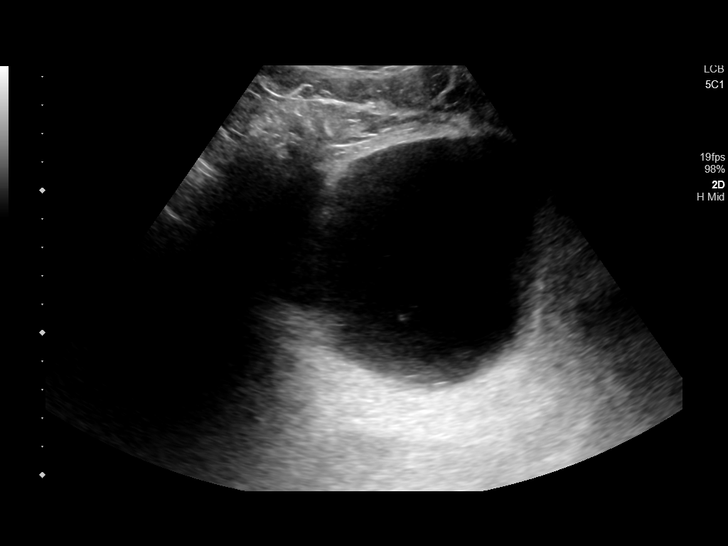
[im 27/43]
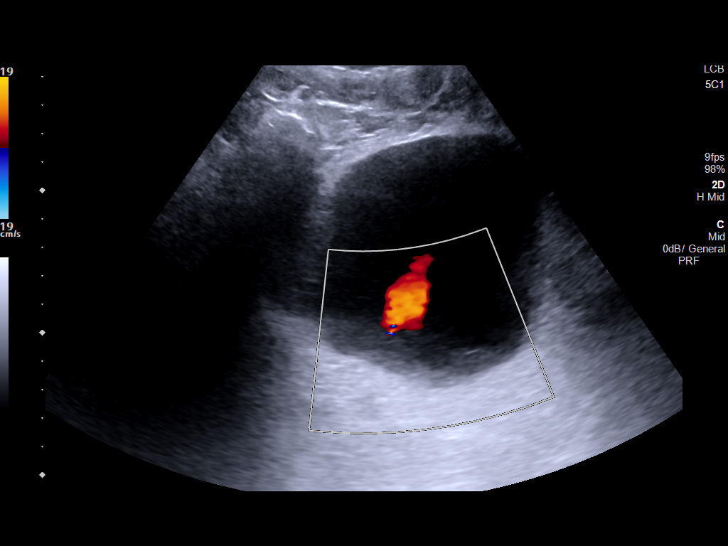
[im 29/43]
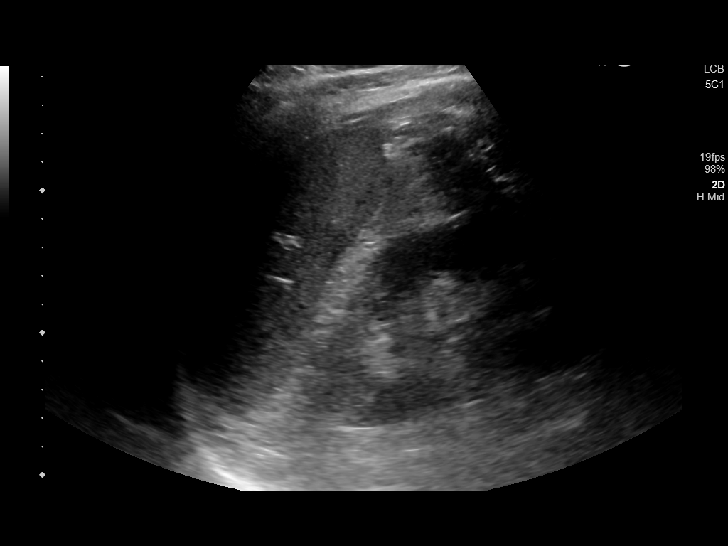
[im 32/43]
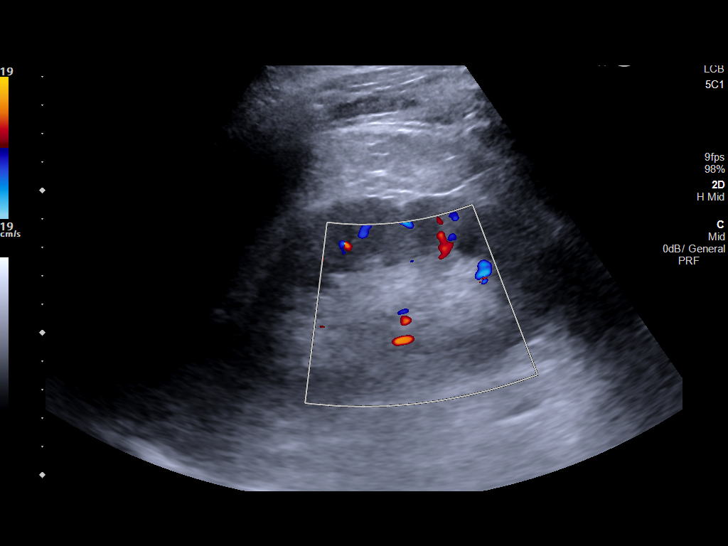
[im 36/43]
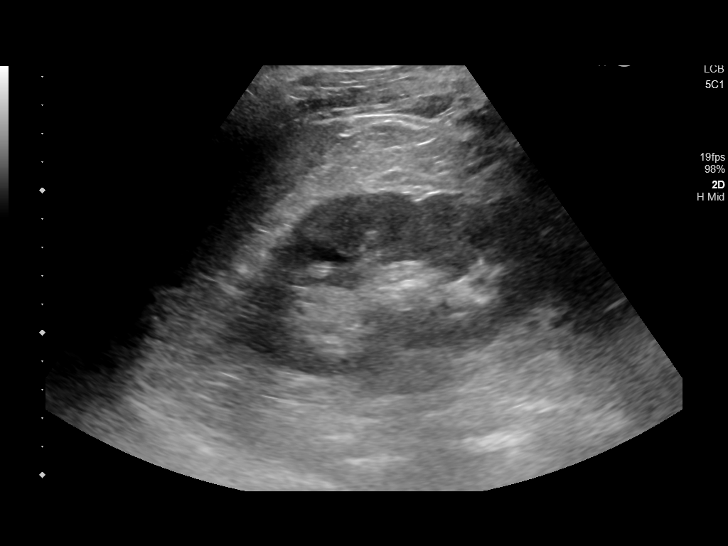
[im 39/43]
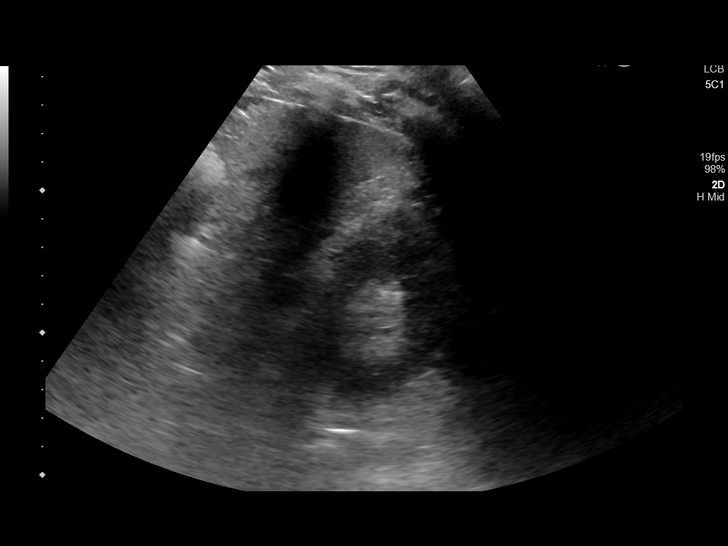
[im 43/43]
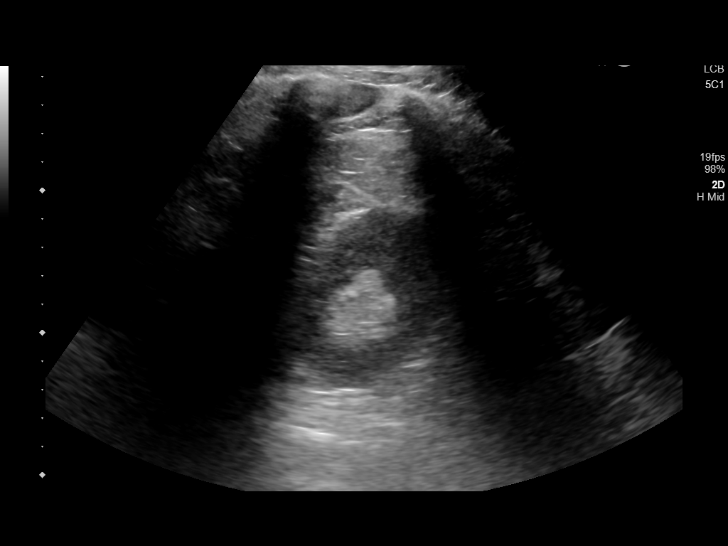

[14 of 25 positions shown; findings below may reference images not displayed]

FINDINGS: Right Kidney:

Renal measurements: 11.2 x 5.4 x 5.9 cm = volume: 191 mL. Normal
echogenicity. There is a 5 mm nonobstructing right renal inferior
pole calculus. No hydronephrosis.

Left Kidney:

Renal measurements: 12.9 x 6.3 x 2.6 cm = volume: 287 mL. Normal
echogenicity. No hydronephrosis or shadowing stone.

Bladder:

Appears normal for degree of bladder distention. Bilateral ureteral
jets noted.

Other:

There is diffuse increased liver echogenicity most commonly seen in
the setting of fatty infiltration. Superimposed inflammation or
fibrosis is not excluded. Clinical correlation is recommended.
IMPRESSION: 1. A 5 mm nonobstructing right renal inferior pole calculus. No
hydronephrosis.
2. No hydronephrosis or nephrolithiasis on the left.

## 2020-09-15 IMAGING — US US RENAL
1 series · 14 of 25 positions shown · non-contrast
Comparison: [DATE] [DATE], [DATE] [DATE] mL

CLINICAL DATA: Left flank pain for 4 days

EXAM:
RENAL / URINARY TRACT ULTRASOUND COMPLETE

[Series 1: us renal · 14 of 42 slices shown]
[im 1/42]
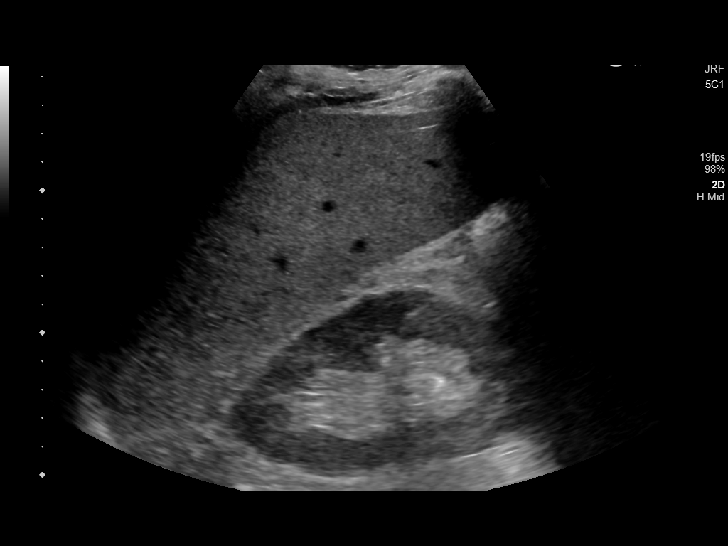
[im 4/42]
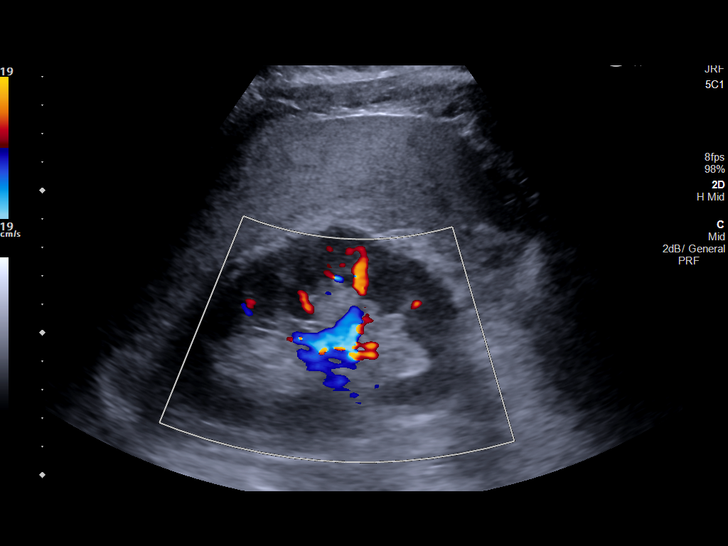
[im 7/42]
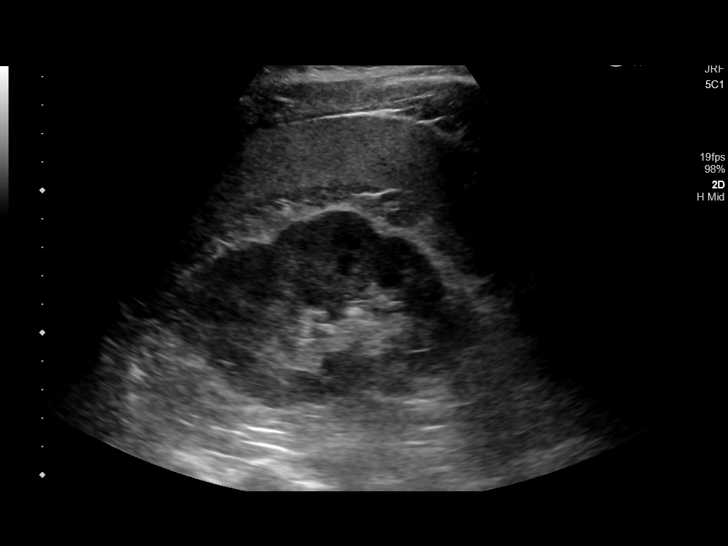
[im 11/42]
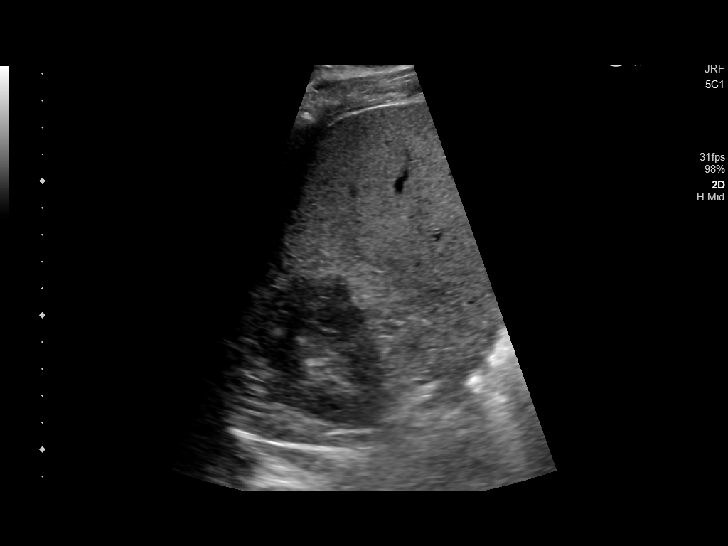
[im 14/42]
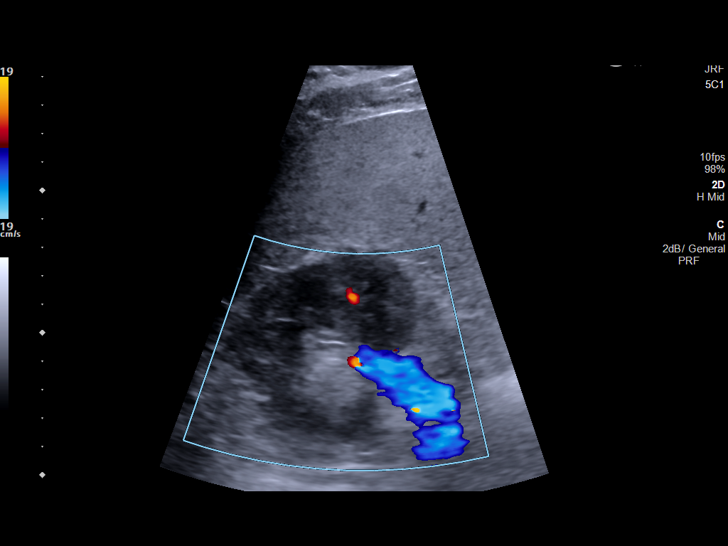
[im 16/42]
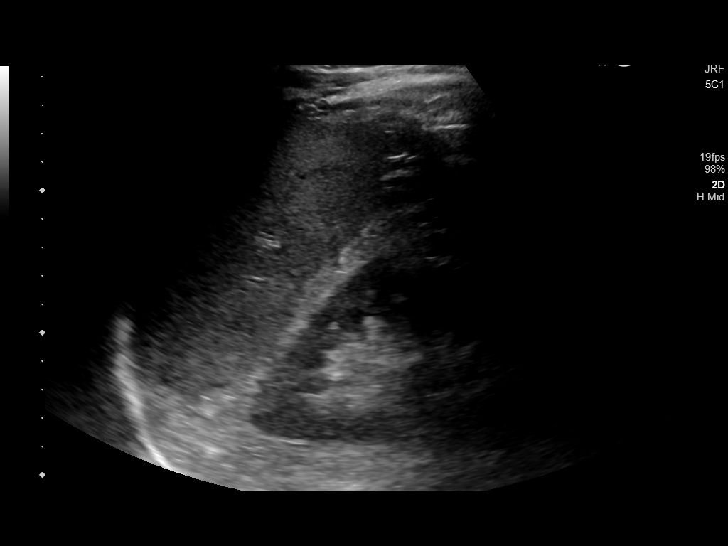
[im 19/42]
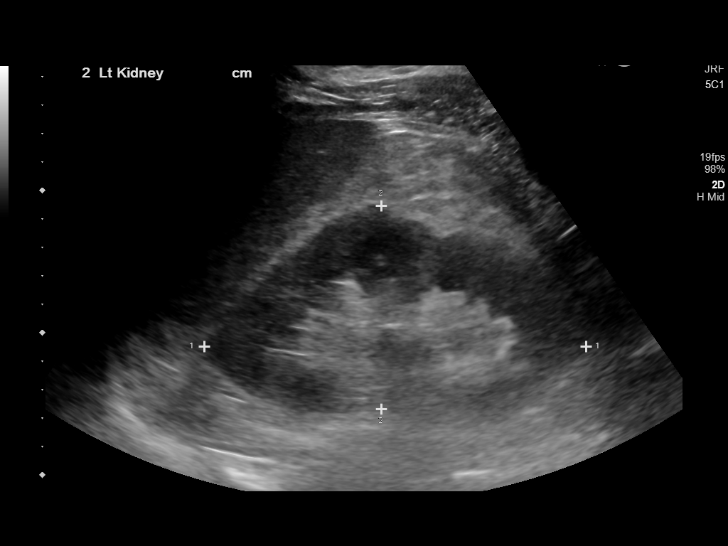
[im 23/42]
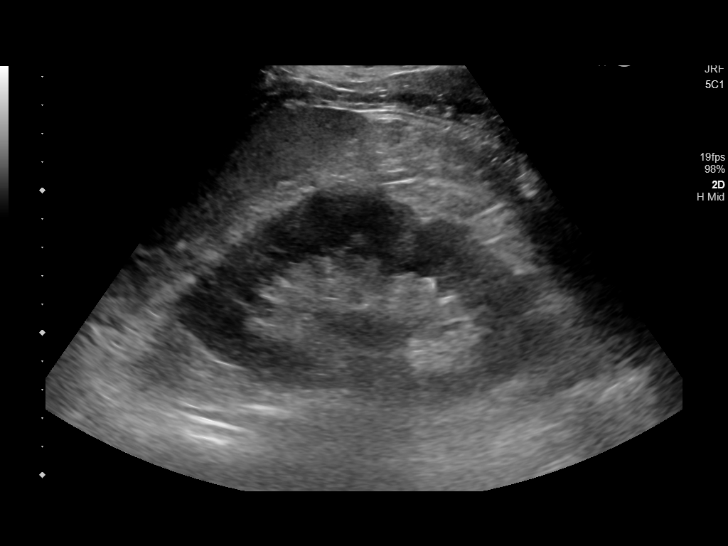
[im 26/42]
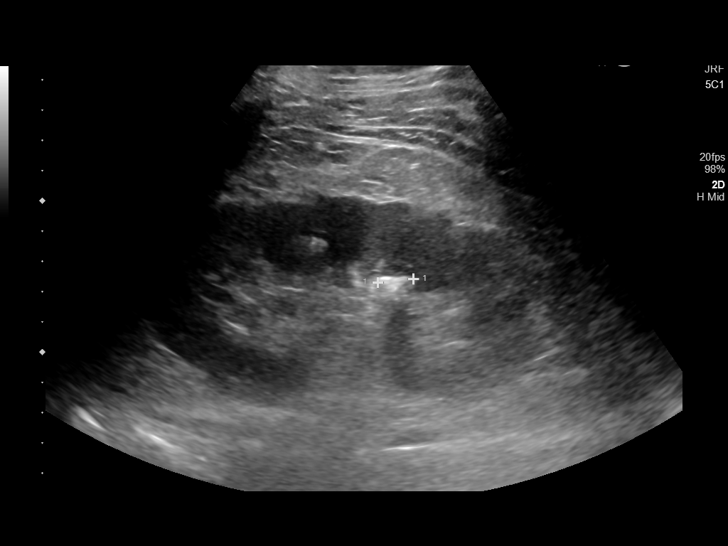
[im 28/42]
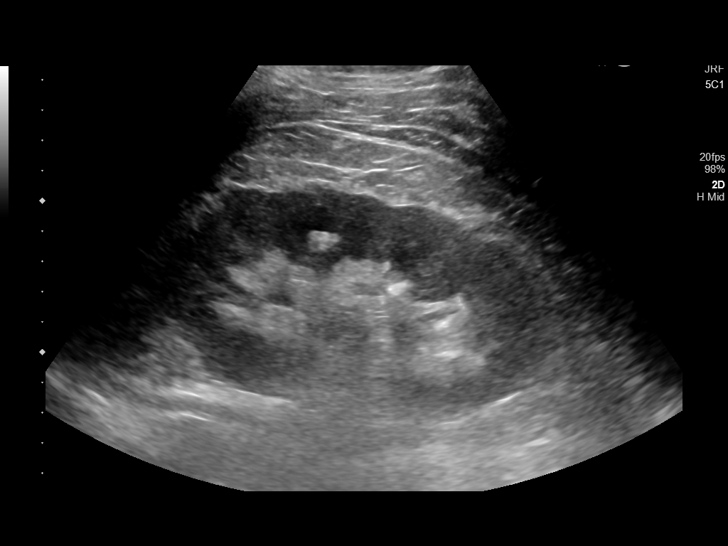
[im 31/42]
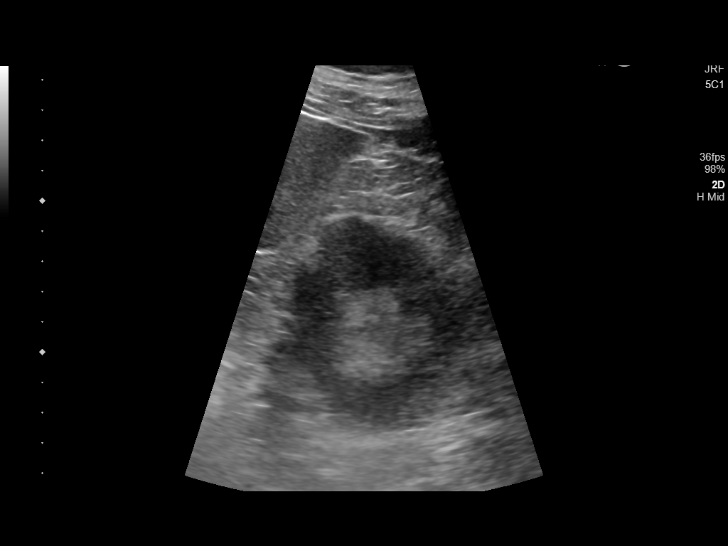
[im 35/42]
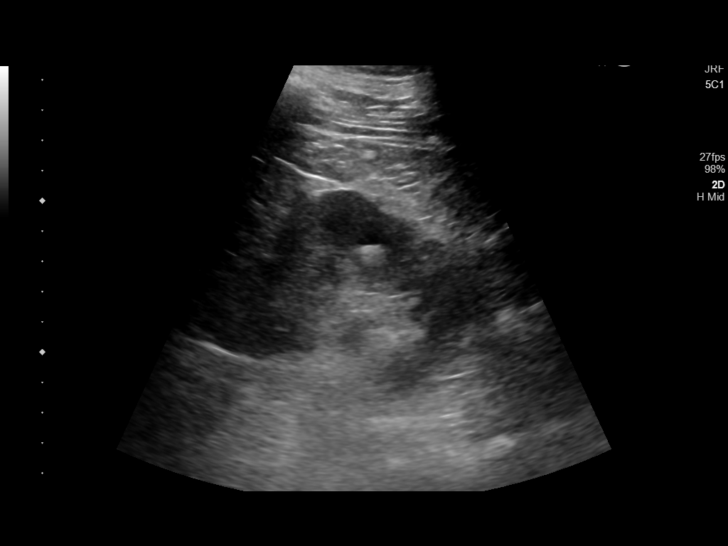
[im 38/42]
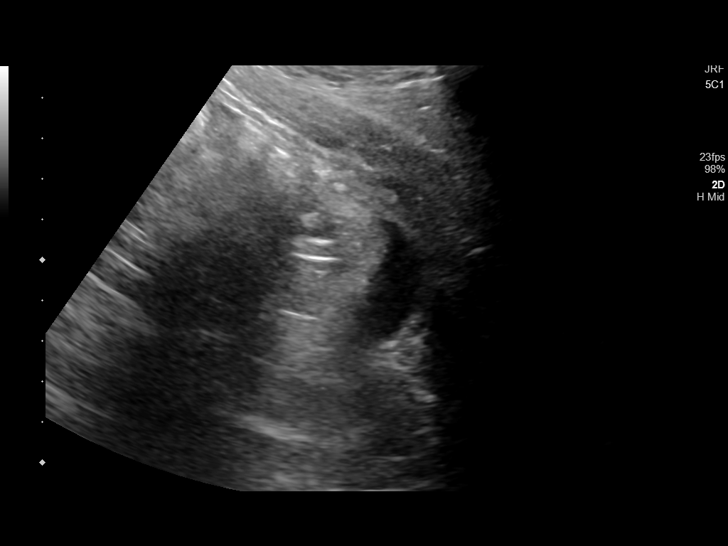
[im 42/42]
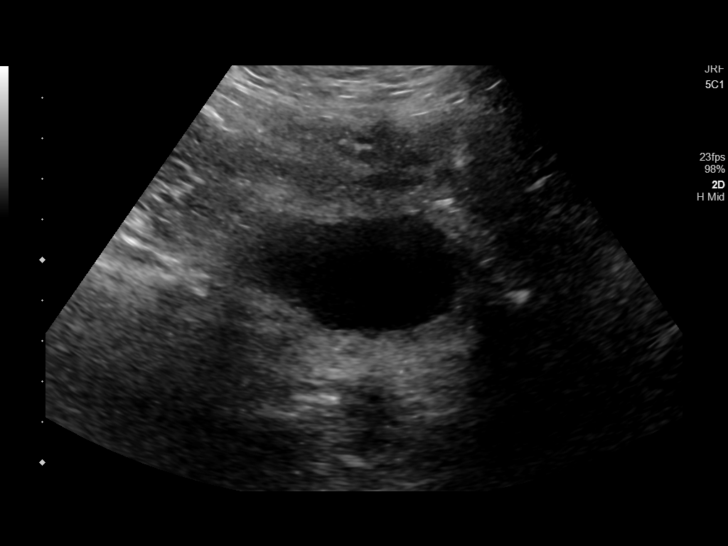

[14 of 25 positions shown; findings below may reference images not displayed]

FINDINGS: Right Kidney:

Renal measurements: 11.0 x 6.3 x 6.0 = volume: 219 mL . Echogenicity
within normal limits. There is a 5 mm echogenic shadowing stone
within the lower pole. No hydronephrosis.

Left Kidney:

Renal measurements: 13.4 x 7.2 x 5.8 = volume: 293 mL. Echogenicity
within normal limits. There is a 1 cm echogenic calculus seen in the
lower pole of the left kidney. No hydronephrosis. Ureteral stent is
not seen.

Bladder:

Bladder is partially decompressed.

Other:

None.
IMPRESSION: Bilateral non-obstructing renal calculi.

## 2020-10-06 ENCOUNTER — Ambulatory Visit: Payer: 59 | Admitting: Urology

## 2020-10-13 ENCOUNTER — Encounter: Payer: Self-pay | Admitting: Urology

## 2020-10-13 ENCOUNTER — Ambulatory Visit: Payer: No Typology Code available for payment source | Admitting: Urology

## 2020-10-13 ENCOUNTER — Other Ambulatory Visit: Payer: Self-pay

## 2020-10-13 ENCOUNTER — Ambulatory Visit
Admission: RE | Admit: 2020-10-13 | Discharge: 2020-10-13 | Disposition: A | Discharge status: Home / Self Care | Payer: No Typology Code available for payment source | Attending: Urology | Admitting: Urology

## 2020-10-13 ENCOUNTER — Ambulatory Visit
Admission: RE | Admit: 2020-10-13 | Discharge: 2020-10-13 | Disposition: A | Discharge status: Home / Self Care | Payer: No Typology Code available for payment source | Source: Ambulatory Visit | Attending: Urology | Admitting: Urology

## 2020-10-13 VITALS — BP 126/79 | HR 80 | Ht 71.0 in | Wt 208.4 lb

## 2020-10-13 DIAGNOSIS — N200 Calculus of kidney: Secondary | ICD-10-CM | POA: Insufficient documentation

## 2020-10-13 NOTE — Progress Notes (Signed)
° °  10/13/2020 3:58 PM   [name] 1971-08-03 [id number]  Reason for visit: Follow up nephrolithiasis  HPI: I saw Mr. Schara back in urology clinic for follow-up of nephrolithiasis.  He is a 49 year old male with cardiac history and recurrent nephrolithiasis who has not tolerated ureteral stent well in the past.  He was hospitalized in February 2021 with an NSTEMI and elevated troponin as well as a 7 mm right proximal ureteral stone, and he ultimately underwent ureteroscopy and laser lithotripsy without stent placement at that time.  He has been doing well since that time with no flank pain or cardiac issues.  He is no longer on anticoagulation aside from baby aspirin daily.  On KUB today, he has a persistent 6 mm left lower pole asymptomatic stone.  He is strongly interested in shockwave lithotripsy for the stone based on his prior episodes of severe renal colic with stone passage, as well as cardiac events associated with renal colic.  I personally reviewed his KUB today that shows a stable 6 mm left lower pole stone, clearly seen on KUB, 9 cm, some distance, 1000HU.   We discussed various treatment options for urolithiasis including observation with or without medical expulsive therapy, shockwave lithotripsy (SWL), ureteroscopy and laser lithotripsy with stent placement, and percutaneous nephrolithotomy.  We discussed that management is based on stone size, location, density, patient co-morbidities, and patient preference.   Stones <67mm in size have a >80% spontaneous passage rate. Data surrounding the use of tamsulosin for medical expulsive therapy is controversial, but meta analyses suggests it is most efficacious for distal stones between 5-65mm in size. Possible side effects include dizziness/lightheadedness, and retrograde ejaculation.  SWL has a lower stone free rate in a single procedure, but also a lower complication rate compared to ureteroscopy and avoids a stent and associated stent  related symptoms. Possible complications include renal hematoma, steinstrasse, and need for additional treatment.  Ureteroscopy with laser lithotripsy and stent placement has a higher stone free rate than SWL in a single procedure, however increased complication rate including possible infection, ureteral injury, bleeding, and stent related morbidity. Common stent related symptoms include dysuria, urgency/frequency, and flank pain.  He would like to proceed with left shockwave lithotripsy for his 6 mm lower pole stone Will need cardiology clearance to stop baby aspirin Okay to schedule left shockwave in the next few weeks when off aspirin  Billey Co, MD  [facility] [address], [place], [place] [address] [phone]

## 2020-10-13 NOTE — Patient Instructions (Signed)
Lithotripsy  Lithotripsy is a treatment that can sometimes help eliminate kidney stones and the pain that they cause. A form of lithotripsy, also known as extracorporeal shock wave lithotripsy, is a nonsurgical procedure that crushes a kidney stone with shock waves. These shock waves pass through your body and focus on the kidney stone. They cause the kidney stone to break up while it is still in the urinary tract. This makes it easier for the smaller pieces of stone to pass in the urine. Tell a health care provider about:  Any allergies you have.  All medicines you are taking, including vitamins, herbs, eye drops, creams, and over-the-counter medicines.  Any blood disorders you have.  Any surgeries you have had.  Any medical conditions you have.  Whether you are pregnant or may be pregnant.  Any problems you or family members have had with anesthetic medicines. What are the risks? Generally, this is a safe procedure. However, problems may occur, including:  Infection.  Bleeding of the kidney.  Bruising of the kidney or skin.  Scarring of the kidney, which can lead to: ? Increased blood pressure. ? Poor kidney function. ? Return (recurrence) of kidney stones.  Damage to other structures or organs, such as the liver, colon, spleen, or pancreas.  Blockage (obstruction) of the the tube that carries urine from the kidney to the bladder (ureter).  Failure of the kidney stone to break into pieces (fragments). What happens before the procedure? Staying hydrated Follow instructions from your health care provider about hydration, which may include:  Up to 2 hours before the procedure - you may continue to drink clear liquids, such as water, clear fruit juice, black coffee, and plain tea. Eating and drinking restrictions Follow instructions from your health care provider about eating and drinking, which may include:  8 hours before the procedure - stop eating heavy meals or foods  such as meat, fried foods, or fatty foods.  6 hours before the procedure - stop eating light meals or foods, such as toast or cereal.  6 hours before the procedure - stop drinking milk or drinks that contain milk.  2 hours before the procedure - stop drinking clear liquids. General instructions  Plan to have someone take you home from the hospital or clinic.  Ask your health care provider about: ? Changing or stopping your regular medicines. This is especially important if you are taking diabetes medicines or blood thinners. ? Taking medicines such as aspirin and ibuprofen. These medicines and other NSAIDs can thin your blood. Do not take these medicines for 7 days before your procedure if your health care provider instructs you not to.  You may have tests, such as: ? Blood tests. ? Urine tests. ? Imaging tests, such as a CT scan. What happens during the procedure?  To lower your risk of infection: ? Your health care team will wash or sanitize their hands. ? Your skin will be washed with soap.  An IV tube will be inserted into one of your veins. This tube will give you fluids and medicines.  You will be given one or more of the following: ? A medicine to help you relax (sedative). ? A medicine to make you fall asleep (general anesthetic).  A water-filled cushion may be placed behind your kidney or on your abdomen. In some cases you may be placed in a tub of lukewarm water.  Your body will be positioned in a way that makes it easy to target the kidney   stone.  A flexible tube with holes in it (stent) may be placed in the ureter. This will help keep urine flowing from the kidney if the fragments of the stone have been blocking the ureter.  An X-ray or ultrasound exam will be done to locate your stone.  Shock waves will be aimed at the stone. If you are awake, you may feel a tapping sensation as the shock waves pass through your body. The procedure may vary among health care  providers and hospitals. What happens after the procedure?  You may have an X-ray to see whether the procedure was able to break up the kidney stone and how much of the stone has passed. If large stone fragments remain after treatment, you may need to have a second procedure at a later time.  Your blood pressure, heart rate, breathing rate, and blood oxygen level will be monitored until the medicines you were given have worn off.  You may be given antibiotics or pain medicine as needed.  If a stent was placed in your ureter during surgery, it may stay in place for a few weeks.  You may need strain your urine to collect pieces of the kidney stone for testing.  You will need to drink plenty of water.  Do not drive for 24 hours if you were given a sedative. Summary  Lithotripsy is a treatment that can sometimes help eliminate kidney stones and the pain that they cause.  A form of lithotripsy, also known as extracorporeal shock wave lithotripsy, is a nonsurgical procedure that crushes a kidney stone with shock waves.  Generally, this is a safe procedure. However, problems may occur, including damage to the kidney or other organs, infection, or obstruction of the tube that carries urine from the kidney to the bladder (ureter).  When you go home, you will need to drink plenty of water. You may be asked to strain your urine to collect pieces of the kidney stone for testing. This information is not intended to replace advice given to you by your health care provider. Make sure you discuss any questions you have with your health care provider. Document Revised: 03/25/2019 Document Reviewed: 11/02/2016 Elsevier Patient Education  2020 [employer]    Lithotripsy, Care After This sheet gives you information about how to care for yourself after your procedure. Your health care provider may also give you more specific instructions. If you have problems or questions, contact your health care  provider. What can I expect after the procedure? After the procedure, it is common to have:  Some blood in your urine. This should only last for a few days.  Soreness in your back, sides, or upper abdomen for a few days.  Blotches or bruises on your back where the pressure wave entered the skin.  Pain, discomfort, or nausea when pieces (fragments) of the kidney stone move through the tube that carries urine from the kidney to the bladder (ureter). Stone fragments may pass soon after the procedure, but they may continue to pass for up to 4-8 weeks. ? If you have severe pain or nausea, contact your health care provider. This may be caused by a large stone that was not broken up, and this may mean that you need more treatment.  Some pain or discomfort during urination.  Some pain or discomfort in the lower abdomen or (in men) at the base of the penis. Follow these instructions at home: Medicines  Take over-the-counter and prescription medicines only as told   by your health care provider.  If you were prescribed an antibiotic medicine, take it as told by your health care provider. Do not stop taking the antibiotic even if you start to feel better.  Do not drive for 24 hours if you were given a medicine to help you relax (sedative).  Do not drive or use heavy machinery while taking prescription pain medicine. Eating and drinking      Drink enough water and fluids to keep your urine clear or pale yellow. This helps any remaining pieces of the stone to pass. It can also help prevent new stones from forming.  Eat plenty of fresh fruits and vegetables.  Follow instructions from your health care provider about eating and drinking restrictions. You may be instructed: ? To reduce how much salt (sodium) you eat or drink. Check ingredients and nutrition facts on packaged foods and beverages. ? To reduce how much meat you eat.  Eat the recommended amount of calcium for your age and gender. Ask  your health care provider how much calcium you should have. General instructions  Get plenty of rest.  Most people can resume normal activities 1-2 days after the procedure. Ask your health care provider what activities are safe for you.  Your health care provider may direct you to lie in a certain position (postural drainage) and tap firmly (percuss) over your kidney area to help stone fragments pass. Follow instructions as told by your health care provider.  If directed, strain all urine through the strainer that was provided by your health care provider. ? Keep all fragments for your health care provider to see. Any stones that are found may be sent to a medical lab for examination. The stone may be as small as a grain of salt.  Keep all follow-up visits as told by your health care provider. This is important. Contact a health care provider if:  You have pain that is severe or does not get better with medicine.  You have nausea that is severe or does not go away.  You have blood in your urine longer than your health care provider told you to expect.  You have more blood in your urine.  You have pain during urination that does not go away.  You urinate more frequently than usual and this does not go away.  You develop a rash or any other possible signs of an allergic reaction. Get help right away if:  You have severe pain in your back, sides, or upper abdomen.  You have severe pain while urinating.  Your urine is very dark red.  You have blood in your stool (feces).  You cannot pass any urine at all.  You feel a strong urge to urinate after emptying your bladder.  You have a fever or chills.  You develop shortness of breath, difficulty breathing, or chest pain.  You have severe nausea that leads to persistent vomiting.  You faint. Summary  After this procedure, it is common to have some pain, discomfort, or nausea when pieces (fragments) of the kidney stone move  through the tube that carries urine from the kidney to the bladder (ureter). If this pain or nausea is severe, however, you should contact your health care provider.  Most people can resume normal activities 1-2 days after the procedure. Ask your health care provider what activities are safe for you.  Drink enough water and fluids to keep your urine clear or pale yellow. This helps any remaining pieces of   the stone to pass, and it can help prevent new stones from forming.  If directed, strain your urine and keep all fragments for your health care provider to see. Fragments or stones may be as small as a grain of salt.  Get help right away if you have severe pain in your back, sides, or upper abdomen or have severe pain while urinating. This information is not intended to replace advice given to you by your health care provider. Make sure you discuss any questions you have with your health care provider. Document Revised: 03/25/2019 Document Reviewed: 11/02/2016 Elsevier Patient Education  2020 [employer] Inc.  

## 2020-10-13 NOTE — H&P (View-Only) (Signed)
   10/13/2020 3:58 PM   John Hartman [birth year] [id number]  Reason for visit: Follow up nephrolithiasis  HPI: I saw [name] back in urology clinic for follow-up of nephrolithiasis.  He is a 50 year old male with cardiac history and recurrent nephrolithiasis who has not tolerated ureteral stent well in the past.  He was hospitalized in February 2021 with an NSTEMI and elevated troponin as well as a 7 mm right proximal ureteral stone, and he ultimately underwent ureteroscopy and laser lithotripsy without stent placement at that time.  He has been doing well since that time with no flank pain or cardiac issues.  He is no longer on anticoagulation aside from baby aspirin daily.  On KUB today, he has a persistent 6 mm left lower pole asymptomatic stone.  He is strongly interested in shockwave lithotripsy for the stone based on his prior episodes of severe renal colic with stone passage, as well as cardiac events associated with renal colic.  I personally reviewed his KUB today that shows a stable 6 mm left lower pole stone, clearly seen on KUB, 9 cm, some distance, 1000HU.   We discussed various treatment options for urolithiasis including observation with or without medical expulsive therapy, shockwave lithotripsy (SWL), ureteroscopy and laser lithotripsy with stent placement, and percutaneous nephrolithotomy.  We discussed that management is based on stone size, location, density, patient co-morbidities, and patient preference.   Stones <73mm in size have a >80% spontaneous passage rate. Data surrounding the use of tamsulosin for medical expulsive therapy is controversial, but meta analyses suggests it is most efficacious for distal stones between 5-82mm in size. Possible side effects include dizziness/lightheadedness, and retrograde ejaculation.  SWL has a lower stone free rate in a single procedure, but also a lower complication rate compared to ureteroscopy and avoids a stent and associated stent  related symptoms. Possible complications include renal hematoma, steinstrasse, and need for additional treatment.  Ureteroscopy with laser lithotripsy and stent placement has a higher stone free rate than SWL in a single procedure, however increased complication rate including possible infection, ureteral injury, bleeding, and stent related morbidity. Common stent related symptoms include dysuria, urgency/frequency, and flank pain.  He would like to proceed with left shockwave lithotripsy for his 6 mm lower pole stone Will need cardiology clearance to stop baby aspirin Okay to schedule left shockwave in the next few weeks when off aspirin  Billey Co, MD  [facility] [address] [place], [place] [address] [phone]

## 2020-10-16 ENCOUNTER — Other Ambulatory Visit: Payer: Self-pay | Admitting: Student in an Organized Health Care Education/Training Program

## 2020-10-16 DIAGNOSIS — G894 Chronic pain syndrome: Secondary | ICD-10-CM

## 2020-10-16 DIAGNOSIS — M5416 Radiculopathy, lumbar region: Secondary | ICD-10-CM

## 2020-10-27 ENCOUNTER — Other Ambulatory Visit: Payer: Self-pay

## 2020-10-27 ENCOUNTER — Other Ambulatory Visit
Admission: RE | Admit: 2020-10-27 | Discharge: 2020-10-27 | Disposition: A | Discharge status: Home / Self Care | Payer: No Typology Code available for payment source | Source: Ambulatory Visit | Attending: Urology | Admitting: Urology

## 2020-10-27 LAB — SARS CORONAVIRUS 2 (TAT 6-24 HRS): SARS Coronavirus 2: NEGATIVE

## 2020-10-28 ENCOUNTER — Other Ambulatory Visit: Payer: Self-pay

## 2020-10-28 ENCOUNTER — Other Ambulatory Visit: Payer: Self-pay | Admitting: Urology

## 2020-10-28 ENCOUNTER — Ambulatory Visit (HOSPITAL_BASED_OUTPATIENT_CLINIC_OR_DEPARTMENT_OTHER)
Payer: No Typology Code available for payment source | Admitting: Student in an Organized Health Care Education/Training Program

## 2020-10-28 ENCOUNTER — Encounter: Payer: Self-pay | Admitting: Student in an Organized Health Care Education/Training Program

## 2020-10-28 VITALS — BP 142/93 | HR 80 | Temp 97.4°F | Resp 18

## 2020-10-28 DIAGNOSIS — M62830 Muscle spasm of back: Secondary | ICD-10-CM

## 2020-10-28 DIAGNOSIS — G8929 Other chronic pain: Secondary | ICD-10-CM | POA: Insufficient documentation

## 2020-10-28 DIAGNOSIS — M5136 Other intervertebral disc degeneration, lumbar region: Secondary | ICD-10-CM

## 2020-10-28 DIAGNOSIS — M47816 Spondylosis without myelopathy or radiculopathy, lumbar region: Secondary | ICD-10-CM

## 2020-10-28 DIAGNOSIS — Z981 Arthrodesis status: Secondary | ICD-10-CM | POA: Insufficient documentation

## 2020-10-28 DIAGNOSIS — M5416 Radiculopathy, lumbar region: Secondary | ICD-10-CM | POA: Insufficient documentation

## 2020-10-28 DIAGNOSIS — M25511 Pain in right shoulder: Secondary | ICD-10-CM | POA: Insufficient documentation

## 2020-10-28 DIAGNOSIS — M48062 Spinal stenosis, lumbar region with neurogenic claudication: Secondary | ICD-10-CM | POA: Insufficient documentation

## 2020-10-28 DIAGNOSIS — N200 Calculus of kidney: Secondary | ICD-10-CM

## 2020-10-28 DIAGNOSIS — G894 Chronic pain syndrome: Secondary | ICD-10-CM | POA: Insufficient documentation

## 2020-10-28 MED ORDER — METHOCARBAMOL 500 MG PO TABS: 500.0000 mg | ORAL_TABLET | Freq: Three times a day (TID) | ORAL | 0 refills | Status: DC | PRN

## 2020-10-28 NOTE — Progress Notes (Signed)
PROVIDER NOTE: Information contained herein reflects review and annotations entered in association with encounter. Interpretation of such information and data should be left to medically-trained personnel. Information provided to patient can be located elsewhere in the medical record under "Patient Instructions". Document created using STT-dictation technology, any transcriptional errors that may result from process are unintentional.    Patient: John Hartman  Service Category: E/M  Provider: Gillis Santa, MD  DOB: August 05, 1971  DOS: 10/28/2020  Specialty: Interventional Pain Management  MRN: [id number]  Setting: Ambulatory outpatient  PCP: [name], FNP  Type: Established Patient    Referring Provider: [name], FNP  Location: Office  Delivery: Face-to-face     HPI  [name], a 49 y.o. year old male, is here today because of his Lumbar facet arthropathy [M47.816]. [name] primary complain today is Back Pain (upper, between shoulder blades) Last encounter: My last encounter with him was on 10/16/2020. Pertinent problems: [name] has Coronary artery disease; Chronic lower back pain; Nephrolithiasis; Chronic pain syndrome; Lumbar radiculopathy; Spinal stenosis of lumbar region with neurogenic claudication; S/P cervical spinal fusion; and Back muscle spasm on their pertinent problem list. Pain Assessment: Severity of   is reported as a 2 /10. Location: Back Upper/between shoulder blades. Onset: More than a month ago. Quality: Spasm, Tightness. Timing: Constant. Modifying factor(s): Zanaflex, heat, ice, topicals, roller. Vitals:  temporal temperature is 97.4 F (36.3 C) (abnormal). His blood pressure is 142/93 (abnormal) and his pulse is 80. His respiration is 18 and oxygen saturation is 100%.   Reason for encounter: medication management.   Overall doing well. Continues on vegan diet, has been on it for over 2 years now. Has experienced kidney stones on vegan diet, first episode was  Oct 2020 (presenting sx was hematuria)- cystoscopy performed with Urology Did have a ureter stent placed but had it removed on day 5 due to severe pain Has another procedure tomorrow - extra shockwave for lithotripsy In regards to pain, endorsing interscapular myofascial pain. On Tizanidine but states that it causes daytime drowsiness so here to discuss alternatives-   ROS  Constitutional: Denies any fever or chills Gastrointestinal: No reported hemesis, hematochezia, vomiting, or acute GI distress Musculoskeletal: mid scapular pain Neurological: No reported episodes of acute onset apraxia, aphasia, dysarthria, agnosia, amnesia, paralysis, loss of coordination, or loss of consciousness  Medication Review  Dapagliflozin-metFORMIN HCl ER, Menthol (Topical Analgesic), Semaglutide, allopurinol, amLODipine, aspirin EC, atorvastatin, cetirizine, diphenhydrAMINE, ezetimibe, famotidine, glucose blood, hydrALAZINE, methocarbamol, metoprolol tartrate, nitroGLYCERIN, pregabalin, and vitamin B-12  History Review  Allergy: [name] is allergic to imdur [isosorbide nitrate], lisinopril, other, drug [tape], tapentadol, and ace inhibitors. Drug: [name]  reports no history of drug use. Alcohol:  reports no history of alcohol use. Tobacco:  reports that he quit smoking about 17 years ago. His smoking use included cigarettes. He has a 10.00 pack-year smoking history. He has never used smokeless tobacco. Social: [name]  reports that he quit smoking about 17 years ago. His smoking use included cigarettes. He has a 10.00 pack-year smoking history. He has never used smokeless tobacco. He reports that he does not drink alcohol and does not use drugs. Medical:  has a past medical history of Angioedema, Bronchitis, DDD (degenerative disc disease), cervical, Diabetes mellitus ([place]), Fatty liver (03/31/2017), GERD (gastroesophageal reflux disease), Gout, Heart attack ([place]), HTN (hypertension), Hypertension, IBS  (irritable bowel syndrome), Kidney stone, LVH (left ventricular hypertrophy) (11/24/2018), Myocardial infarction ([place]) (08/23/2018), Seasonal allergies, and Spinal stenosis. Surgical:  [name]  has a past surgical history that includes Shoulder surgery (Left, 2007); Nasal septum surgery (2004); ACDF with fusion (2007); Testicular torsion (1980s); Vasectomy; Colonoscopy with propofol (N/A, 06/26/2015); Cardiac catheterization (Left, 08/15/2016); Coronary/Graft Acute MI Revascularization (N/A, 08/23/2018); LEFT HEART CATH AND CORONARY ANGIOGRAPHY (N/A, 08/23/2018); Cystoscopy with biopsy (N/A, 11/22/2019); Cystoscopy/ureteroscopy/holmium laser/stent placement (Left, 11/22/2019); Shoulder arthroscopy w/ rotator cuff repair (10/01/2019); LEFT HEART CATH AND CORONARY ANGIOGRAPHY (N/A, 02/10/2020); Cystoscopy/ureteroscopy/holmium laser (02/10/2020); and Cystoscopy w/ retrogrades (02/10/2020). Family: family history includes Allergies in his son; Alzheimer's disease in his maternal grandmother; Arrhythmia in his mother; Breast cancer in his mother; Cancer in his maternal grandfather and mother; Diabetes in his father; Heart disease in his brother; Hypertension in his father; Skin cancer in his mother.  Laboratory Chemistry Profile   Renal Lab Results  Component Value Date   BUN 14 06/17/2020   CREATININE 0.96 75/88/3254   BCR NOT APPLICABLE 98/26/4158   GFRAA >60 06/17/2020   GFRNONAA >60 06/17/2020     Hepatic Lab Results  Component Value Date   AST 40 06/17/2020   ALT 59 (H) 06/17/2020   ALBUMIN 4.4 06/17/2020   ALKPHOS 72 06/17/2020   LIPASE 39 06/17/2020     Electrolytes Lab Results  Component Value Date   NA 138 06/17/2020   K 4.2 06/17/2020   CL 102 06/17/2020   CALCIUM 9.4 06/17/2020   MG 2.3 02/12/2020   PHOS 1.9 (L) 08/27/2016     Bone Lab Results  Component Value Date   VD25OH 30 08/04/2017     Inflammation (CRP: Acute Phase) (ESR: Chronic Phase) No results found for: CRP,  ESRSEDRATE, LATICACIDVEN     Note: Above Lab results reviewed.  Recent Imaging Review  Abdomen 1 view (KUB) CLINICAL DATA:  Follow-up right kidney stone.  EXAM: ABDOMEN - 1 VIEW  COMPARISON:  Abdomen radiograph and abdomen and pelvis CT dated 02/07/2020  FINDINGS: Normal bowel gas pattern. Stable 7 mm lower pole left renal calculus. No radiographically visible right renal, ureteral or bladder calculi. Lumbar spine degenerative changes, most pronounced at the L1-2 level.  IMPRESSION: Stable 7 mm lower pole left renal calculus. No additional calculi visualized.  Electronically Signed   By: Claudie Revering M.D.   On: 10/14/2020 15:00 Note: Reviewed        Physical Exam  General appearance: Well nourished, well developed, and well hydrated. In no apparent acute distress Mental status: Alert, oriented x 3 (person, place, & time)       Respiratory: No evidence of acute respiratory distress Eyes: PERLA Vitals: BP (!) 142/93   Pulse 80   Temp (!) 97.4 F (36.3 C) (Temporal)   Resp 18   SpO2 100%  BMI: Estimated body mass index is 29.07 kg/m as calculated from the following:   Height as of 10/13/20: '5\' 11"'  (1.803 m).   Weight as of 10/13/20: 208 lb 6.4 oz (94.5 kg). Ideal: Patient weight not recorded   Thoracic Spine Area Exam  Skin & Axial Inspection: No masses, redness, or swelling Alignment: Symmetrical Functional ROM: Unrestricted ROM Stability: No instability detected Muscle Tone/Strength: Functionally intact. No obvious neuro-muscular anomalies detected. Sensory (Neurological): Musculoskeletal pain pattern Muscle strength & Tone: No palpable anomalies Lumbar Spine Area Exam  Skin & Axial Inspection: No masses, redness, or swelling Alignment: Symmetrical Functional ROM: Unrestricted ROM       Stability: No instability detected Muscle Tone/Strength: Functionally intact. No obvious neuro-muscular anomalies detected. Sensory (Neurological): Musculoskeletal pain  pattern  Gait &  Posture Assessment  Ambulation: Unassisted Gait: Relatively normal for age and body habitus Posture: WNL  Lower Extremity Exam    Side: Right lower extremity  Side: Left lower extremity  Stability: No instability observed          Stability: No instability observed          Skin & Extremity Inspection: Skin color, temperature, and hair growth are WNL. No peripheral edema or cyanosis. No masses, redness, swelling, asymmetry, or associated skin lesions. No contractures.  Skin & Extremity Inspection: Skin color, temperature, and hair growth are WNL. No peripheral edema or cyanosis. No masses, redness, swelling, asymmetry, or associated skin lesions. No contractures.  Functional ROM: Unrestricted ROM                  Functional ROM: Unrestricted ROM                  Muscle Tone/Strength: Functionally intact. No obvious neuro-muscular anomalies detected.  Muscle Tone/Strength: Functionally intact. No obvious neuro-muscular anomalies detected.  Sensory (Neurological): Unimpaired        Sensory (Neurological): Unimpaired        DTR: Patellar: deferred today Achilles: deferred today Plantar: deferred today  DTR: Patellar: deferred today Achilles: deferred today Plantar: deferred today  Palpation: No palpable anomalies  Palpation: No palpable anomalies    Assessment   Status Diagnosis  Controlled Controlled Controlled 1. Lumbar facet arthropathy   2. Spondylosis of lumbar region without myelopathy or radiculopathy   3. Lumbar radiculopathy   4. Spinal stenosis of lumbar region with neurogenic claudication   5. S/P cervical spinal fusion   6. Degenerative disc disease, lumbar   7. Chronic right shoulder pain   8. Chronic pain syndrome   9. Back muscle spasm      Updated Problems: Problem  Back Muscle Spasm  Chronic Pain Syndrome  Lumbar Radiculopathy  Spinal Stenosis of Lumbar Region With Neurogenic Claudication  S/P Cervical Spinal Fusion  Nephrolithiasis   Chronic Lower Back Pain  Coronary Artery Disease   Cardiac cath Aug 2017; goal LDL less than 70   Lumbar facet arthropathy    Plan of Care  [name] has a current medication list which includes the following long-term medication(s): allopurinol, amlodipine, atorvastatin, ezetimibe, hydralazine, metoprolol tartrate, nitroglycerin, and pregabalin.   Overall, I am very pleased with how John Hartman is doing.  He has made remarkable change in his weight and lifestyle.  He has changed to a vegan diet and has been practicing this for the last 2 years.  He states that his energy levels are better, he is in less overall pain, he has lost over 50 pounds over the last 2 years.  I commended him on his accomplishment.  For his thoracic/interscapular muscle spasms, recommend trial of Robaxin as below.  Recommend discontinue tizanidine for the time being.  Also recommend that he continue magnesium for muscle cramps/spasms..  I will have the patient follow-up in 6 months to see how he is doing.  I have encouraged him to continue with dieting, healthy eating, exercise.  Pharmacotherapy (Medications Ordered): Meds ordered this encounter  Medications  . methocarbamol (ROBAXIN) 500 MG tablet    Sig: Take 1 tablet (500 mg total) by mouth every 8 (eight) hours as needed for muscle spasms.    Dispense:  90 tablet    Refill:  0    Do not place this medication, or any other prescription from our practice, on "Automatic Refill". Patient may  have prescription filled one day early if pharmacy is closed on scheduled refill date.    Follow-up plan:   Return in about 6 months (around 04/27/2021) for Medication Management, in person.   Recent Visits No visits were found meeting these conditions. Showing recent visits within past 90 days and meeting all other requirements Today's Visits Date Type Provider Dept  10/28/20 Office Visit Gillis Santa, MD [facility]  Showing today's visits and meeting all other  requirements Future Appointments No visits were found meeting these conditions. Showing future appointments within next 90 days and meeting all other requirements  I discussed the assessment and treatment plan with the patient. The patient was provided an opportunity to ask questions and all were answered. The patient agreed with the plan and demonstrated an understanding of the instructions.  Patient advised to call back or seek an in-person evaluation if the symptoms or condition worsens.  Duration of encounter: 20 minutes.  Note by: Gillis Santa, MD Date: 10/28/2020; Time: 1:43 PM

## 2020-10-28 NOTE — Progress Notes (Signed)
Safety precautions to be maintained throughout the outpatient stay will include: orient to surroundings, keep bed in low position, maintain call bell within reach at all times, provide assistance with transfer out of bed and ambulation.  

## 2020-10-29 ENCOUNTER — Encounter
Payer: No Typology Code available for payment source | Admitting: Student in an Organized Health Care Education/Training Program

## 2020-10-29 ENCOUNTER — Other Ambulatory Visit: Payer: Self-pay

## 2020-10-29 ENCOUNTER — Emergency Department: Payer: No Typology Code available for payment source

## 2020-10-29 ENCOUNTER — Encounter: Payer: Self-pay | Admitting: Intensive Care

## 2020-10-29 ENCOUNTER — Ambulatory Visit: Payer: No Typology Code available for payment source

## 2020-10-29 ENCOUNTER — Inpatient Hospital Stay
Admission: EM | Admit: 2020-10-29 | Discharge: 2020-11-01 | DRG: 637 | Disposition: A | Discharge status: Home / Self Care | Payer: No Typology Code available for payment source | Attending: Internal Medicine | Admitting: Internal Medicine

## 2020-10-29 ENCOUNTER — Encounter: Payer: Self-pay | Admitting: Urology

## 2020-10-29 ENCOUNTER — Ambulatory Visit
Admission: RE | Admit: 2020-10-29 | Discharge: 2020-10-29 | Disposition: A | Discharge status: Home / Self Care | Payer: No Typology Code available for payment source | Source: Home / Self Care | Attending: Urology | Admitting: Urology

## 2020-10-29 ENCOUNTER — Telehealth: Payer: Self-pay | Admitting: *Deleted

## 2020-10-29 ENCOUNTER — Encounter
Admission: RE | Disposition: A | Discharge status: Home / Self Care | Payer: Self-pay | Source: Home / Self Care | Attending: Urology

## 2020-10-29 DIAGNOSIS — Z20822 Contact with and (suspected) exposure to covid-19: Secondary | ICD-10-CM | POA: Diagnosis present

## 2020-10-29 DIAGNOSIS — E876 Hypokalemia: Secondary | ICD-10-CM | POA: Diagnosis present

## 2020-10-29 DIAGNOSIS — K219 Gastro-esophageal reflux disease without esophagitis: Secondary | ICD-10-CM | POA: Diagnosis present

## 2020-10-29 DIAGNOSIS — Z79899 Other long term (current) drug therapy: Secondary | ICD-10-CM

## 2020-10-29 DIAGNOSIS — I25118 Atherosclerotic heart disease of native coronary artery with other forms of angina pectoris: Secondary | ICD-10-CM | POA: Diagnosis present

## 2020-10-29 DIAGNOSIS — Z87442 Personal history of urinary calculi: Secondary | ICD-10-CM

## 2020-10-29 DIAGNOSIS — J302 Other seasonal allergic rhinitis: Secondary | ICD-10-CM | POA: Diagnosis present

## 2020-10-29 DIAGNOSIS — R000 Tachycardia, unspecified: Secondary | ICD-10-CM | POA: Diagnosis not present

## 2020-10-29 DIAGNOSIS — I252 Old myocardial infarction: Secondary | ICD-10-CM | POA: Diagnosis not present

## 2020-10-29 DIAGNOSIS — N200 Calculus of kidney: Secondary | ICD-10-CM

## 2020-10-29 DIAGNOSIS — E1110 Type 2 diabetes mellitus with ketoacidosis without coma: Secondary | ICD-10-CM | POA: Diagnosis not present

## 2020-10-29 DIAGNOSIS — K760 Fatty (change of) liver, not elsewhere classified: Secondary | ICD-10-CM | POA: Diagnosis present

## 2020-10-29 DIAGNOSIS — Z7984 Long term (current) use of oral hypoglycemic drugs: Secondary | ICD-10-CM

## 2020-10-29 DIAGNOSIS — Z87891 Personal history of nicotine dependence: Secondary | ICD-10-CM | POA: Diagnosis not present

## 2020-10-29 DIAGNOSIS — E860 Dehydration: Secondary | ICD-10-CM | POA: Diagnosis present

## 2020-10-29 DIAGNOSIS — I10 Essential (primary) hypertension: Secondary | ICD-10-CM | POA: Diagnosis present

## 2020-10-29 DIAGNOSIS — I5022 Chronic systolic (congestive) heart failure: Secondary | ICD-10-CM | POA: Diagnosis present

## 2020-10-29 DIAGNOSIS — Z833 Family history of diabetes mellitus: Secondary | ICD-10-CM

## 2020-10-29 DIAGNOSIS — X58XXXA Exposure to other specified factors, initial encounter: Secondary | ICD-10-CM | POA: Diagnosis present

## 2020-10-29 DIAGNOSIS — Z955 Presence of coronary angioplasty implant and graft: Secondary | ICD-10-CM

## 2020-10-29 DIAGNOSIS — Z888 Allergy status to other drugs, medicaments and biological substances status: Secondary | ICD-10-CM | POA: Diagnosis not present

## 2020-10-29 DIAGNOSIS — Z91048 Other nonmedicinal substance allergy status: Secondary | ICD-10-CM | POA: Diagnosis not present

## 2020-10-29 DIAGNOSIS — Z8249 Family history of ischemic heart disease and other diseases of the circulatory system: Secondary | ICD-10-CM | POA: Diagnosis not present

## 2020-10-29 DIAGNOSIS — Z820 Family history of epilepsy and other diseases of the nervous system: Secondary | ICD-10-CM

## 2020-10-29 DIAGNOSIS — G4733 Obstructive sleep apnea (adult) (pediatric): Secondary | ICD-10-CM | POA: Diagnosis present

## 2020-10-29 DIAGNOSIS — R778 Other specified abnormalities of plasma proteins: Secondary | ICD-10-CM

## 2020-10-29 DIAGNOSIS — E785 Hyperlipidemia, unspecified: Secondary | ICD-10-CM | POA: Diagnosis present

## 2020-10-29 DIAGNOSIS — T148XXA Other injury of unspecified body region, initial encounter: Secondary | ICD-10-CM

## 2020-10-29 DIAGNOSIS — I21A1 Myocardial infarction type 2: Secondary | ICD-10-CM | POA: Diagnosis present

## 2020-10-29 DIAGNOSIS — S37012A Minor contusion of left kidney, initial encounter: Secondary | ICD-10-CM | POA: Diagnosis present

## 2020-10-29 DIAGNOSIS — Z7982 Long term (current) use of aspirin: Secondary | ICD-10-CM

## 2020-10-29 DIAGNOSIS — G894 Chronic pain syndrome: Secondary | ICD-10-CM | POA: Diagnosis present

## 2020-10-29 DIAGNOSIS — I110 Hypertensive heart disease with heart failure: Secondary | ICD-10-CM | POA: Diagnosis present

## 2020-10-29 DIAGNOSIS — S37019A Minor contusion of unspecified kidney, initial encounter: Secondary | ICD-10-CM | POA: Diagnosis present

## 2020-10-29 DIAGNOSIS — I214 Non-ST elevation (NSTEMI) myocardial infarction: Secondary | ICD-10-CM | POA: Diagnosis present

## 2020-10-29 DIAGNOSIS — E119 Type 2 diabetes mellitus without complications: Secondary | ICD-10-CM

## 2020-10-29 DIAGNOSIS — I2510 Atherosclerotic heart disease of native coronary artery without angina pectoris: Secondary | ICD-10-CM | POA: Diagnosis present

## 2020-10-29 DIAGNOSIS — R310 Gross hematuria: Secondary | ICD-10-CM | POA: Diagnosis present

## 2020-10-29 DIAGNOSIS — I5020 Unspecified systolic (congestive) heart failure: Secondary | ICD-10-CM | POA: Diagnosis present

## 2020-10-29 HISTORY — PX: EXTRACORPOREAL SHOCK WAVE LITHOTRIPSY: SHX1557

## 2020-10-29 LAB — COMPREHENSIVE METABOLIC PANEL
ALT: 77 U/L — ABNORMAL HIGH (ref 0–44)
AST: 70 U/L — ABNORMAL HIGH (ref 15–41)
Albumin: 5.5 g/dL — ABNORMAL HIGH (ref 3.5–5.0)
Alkaline Phosphatase: 105 U/L (ref 38–126)
Anion gap: 19 — ABNORMAL HIGH (ref 5–15)
BUN: 13 mg/dL (ref 6–20)
CO2: 20 mmol/L — ABNORMAL LOW (ref 22–32)
Calcium: 9.5 mg/dL (ref 8.9–10.3)
Chloride: 98 mmol/L (ref 98–111)
Creatinine, Ser: 1.18 mg/dL (ref 0.61–1.24)
GFR, Estimated: >60 mL/min (ref >60)
Glucose, Bld: 220 mg/dL — ABNORMAL HIGH (ref 70–99)
Potassium: 4.6 mmol/L (ref 3.5–5.1)
Sodium: 137 mmol/L (ref 135–145)
Total Bilirubin: 3 mg/dL — ABNORMAL HIGH (ref 0.3–1.2)
Total Protein: 9 g/dL — ABNORMAL HIGH (ref 6.5–8.1)

## 2020-10-29 LAB — CBC
HCT: 51.1 % (ref 39.0–52.0)
Hemoglobin: 16.9 g/dL (ref 13.0–17.0)
MCH: 26.6 pg (ref 26.0–34.0)
MCHC: 33.1 g/dL (ref 30.0–36.0)
MCV: 80.5 fL (ref 80.0–100.0)
Platelets: 254 10*3/uL (ref 150–400)
RBC: 6.35 MIL/uL — ABNORMAL HIGH (ref 4.22–5.81)
RDW: 13.2 % (ref 11.5–15.5)
WBC: 15.4 10*3/uL — ABNORMAL HIGH (ref 4.0–10.5)
nRBC: 0 % (ref 0.0–0.2)

## 2020-10-29 LAB — URINALYSIS, COMPLETE (UACMP) WITH MICROSCOPIC
Bacteria, UA: NONE SEEN
Bilirubin Urine: NEGATIVE
Glucose, UA: >=500 mg/dL — AB
Ketones, ur: 80 mg/dL — AB
Leukocytes,Ua: NEGATIVE
Nitrite: NEGATIVE
Protein, ur: 100 mg/dL — AB
RBC / HPF: >50 RBC/hpf — ABNORMAL HIGH (ref 0–5)
Specific Gravity, Urine: 1.012 (ref 1.005–1.030)
Squamous Epithelial / HPF: NONE SEEN (ref 0–5)
pH: 5 (ref 5.0–8.0)

## 2020-10-29 LAB — LIPASE, BLOOD: Lipase: 46 U/L (ref 11–51)

## 2020-10-29 LAB — TROPONIN I (HIGH SENSITIVITY)
Troponin I (High Sensitivity): 408 ng/L (ref <18)
Troponin I (High Sensitivity): 637 ng/L (ref <18)

## 2020-10-29 LAB — LACTIC ACID, PLASMA
Lactic Acid, Venous: 3.8 mmol/L (ref 0.5–1.9)
Lactic Acid, Venous: 1.5 mmol/L (ref 0.5–1.9)

## 2020-10-29 SURGERY — LITHOTRIPSY, ESWL
Anesthesia: Moderate Sedation | Laterality: Left

## 2020-10-29 MED ORDER — DIAZEPAM 5 MG PO TABS
ORAL_TABLET | ORAL | Status: AC
  Filled 2020-10-29: qty 2

## 2020-10-29 MED ORDER — ONDANSETRON HCL 4 MG/2ML IJ SOLN
INTRAMUSCULAR | Status: AC
  Administered 2020-10-29: 4 mg via INTRAVENOUS
  Filled 2020-10-29: qty 2

## 2020-10-29 MED ORDER — LACTATED RINGERS IV BOLUS
1000.0000 mL | Freq: Once | INTRAVENOUS | Status: AC
  Administered 2020-10-29: 1000 mL via INTRAVENOUS

## 2020-10-29 MED ORDER — ONDANSETRON HCL 4 MG/2ML IJ SOLN
INTRAMUSCULAR | Status: AC
  Filled 2020-10-29: qty 2

## 2020-10-29 MED ORDER — CIPROFLOXACIN HCL 500 MG PO TABS: 500.0000 mg | ORAL_TABLET | ORAL | Status: AC

## 2020-10-29 MED ORDER — KETOROLAC TROMETHAMINE 30 MG/ML IJ SOLN
30.0000 mg | Freq: Once | INTRAMUSCULAR | Status: AC
  Administered 2020-10-29: 30 mg via INTRAVENOUS

## 2020-10-29 MED ORDER — ONDANSETRON HCL 4 MG PO TABS: 4.0000 mg | ORAL_TABLET | Freq: Three times a day (TID) | ORAL | 0 refills | Status: DC | PRN

## 2020-10-29 MED ORDER — KETOROLAC TROMETHAMINE 30 MG/ML IJ SOLN
INTRAMUSCULAR | Status: AC
  Filled 2020-10-29: qty 1

## 2020-10-29 MED ORDER — IBUPROFEN 800 MG PO TABS: 800.0000 mg | ORAL_TABLET | Freq: Three times a day (TID) | ORAL | 0 refills | Status: DC | PRN

## 2020-10-29 MED ORDER — ONDANSETRON HCL 4 MG/2ML IJ SOLN: 4.0000 mg | Freq: Once | INTRAMUSCULAR | Status: AC | PRN

## 2020-10-29 MED ORDER — FLUCONAZOLE 50 MG PO TABS: 150.0000 mg | ORAL_TABLET | Freq: Once | ORAL | Status: DC

## 2020-10-29 MED ORDER — DIAZEPAM 5 MG PO TABS
10.0000 mg | ORAL_TABLET | ORAL | Status: AC
  Administered 2020-10-29: 10 mg via ORAL

## 2020-10-29 MED ORDER — ONDANSETRON HCL 4 MG/2ML IJ SOLN
4.0000 mg | Freq: Once | INTRAMUSCULAR | Status: AC | PRN
  Administered 2020-10-29: 4 mg via INTRAVENOUS

## 2020-10-29 MED ORDER — TAMSULOSIN HCL 0.4 MG PO CAPS: 0.4000 mg | ORAL_CAPSULE | Freq: Every day | ORAL | 0 refills | Status: DC

## 2020-10-29 MED ORDER — PROCHLORPERAZINE EDISYLATE 10 MG/2ML IJ SOLN
10.0000 mg | Freq: Once | INTRAMUSCULAR | Status: AC
  Administered 2020-10-29: 10 mg via INTRAVENOUS
  Filled 2020-10-29: qty 2

## 2020-10-29 MED ORDER — FLUCONAZOLE 100 MG PO TABS
200.0000 mg | ORAL_TABLET | Freq: Once | ORAL | Status: AC
  Administered 2020-10-29: 200 mg via ORAL
  Filled 2020-10-29: qty 2

## 2020-10-29 MED ORDER — HYDROCODONE-ACETAMINOPHEN 5-325 MG PO TABS
1.0000 | ORAL_TABLET | Freq: Four times a day (QID) | ORAL | Status: DC | PRN
  Administered 2020-10-29: 1 via ORAL

## 2020-10-29 MED ORDER — IOHEXOL 350 MG/ML SOLN
75.0000 mL | Freq: Once | INTRAVENOUS | Status: AC | PRN
  Administered 2020-10-29: 75 mL via INTRAVENOUS

## 2020-10-29 MED ORDER — MORPHINE SULFATE (PF) 4 MG/ML IV SOLN
4.0000 mg | Freq: Once | INTRAVENOUS | Status: AC
  Administered 2020-10-29: 4 mg via INTRAVENOUS
  Filled 2020-10-29: qty 1

## 2020-10-29 MED ORDER — PROMETHAZINE HCL 25 MG/ML IJ SOLN
25.0000 mg | Freq: Four times a day (QID) | INTRAMUSCULAR | Status: DC | PRN
  Administered 2020-10-29 – 2020-10-30 (×2): 25 mg via INTRAVENOUS
  Filled 2020-10-29 (×2): qty 1

## 2020-10-29 MED ORDER — HYDROCODONE-ACETAMINOPHEN 5-325 MG PO TABS
ORAL_TABLET | ORAL | Status: AC
  Filled 2020-10-29: qty 1

## 2020-10-29 MED ORDER — FENTANYL CITRATE (PF) 100 MCG/2ML IJ SOLN
50.0000 ug | INTRAMUSCULAR | Status: DC | PRN
  Administered 2020-10-29: 50 ug via INTRAVENOUS
  Filled 2020-10-29 (×2): qty 2

## 2020-10-29 MED ORDER — HYDROCODONE-ACETAMINOPHEN 5-325 MG PO TABS
1.0000 | ORAL_TABLET | Freq: Four times a day (QID) | ORAL | 0 refills | Status: DC | PRN
Start: 2020-10-29 — End: 2020-11-17

## 2020-10-29 MED ORDER — ONDANSETRON HCL 4 MG/2ML IJ SOLN
4.0000 mg | Freq: Once | INTRAMUSCULAR | Status: AC
  Administered 2020-10-29: 4 mg via INTRAVENOUS

## 2020-10-29 MED ORDER — DIPHENHYDRAMINE HCL 25 MG PO CAPS
ORAL_CAPSULE | ORAL | Status: AC
  Administered 2020-10-29: 25 mg via ORAL
  Filled 2020-10-29: qty 1

## 2020-10-29 MED ORDER — METOPROLOL TARTRATE 5 MG/5ML IV SOLN
5.0000 mg | Freq: Once | INTRAVENOUS | Status: AC
  Administered 2020-10-29: 5 mg via INTRAVENOUS
  Filled 2020-10-29: qty 5

## 2020-10-29 MED ORDER — ONDANSETRON 4 MG PO TBDP: 4.0000 mg | ORAL_TABLET | Freq: Three times a day (TID) | ORAL | 0 refills | Status: DC | PRN

## 2020-10-29 MED ORDER — ONDANSETRON 4 MG PO TBDP
4.0000 mg | ORAL_TABLET | Freq: Once | ORAL | Status: DC | PRN
  Filled 2020-10-29: qty 1

## 2020-10-29 MED ORDER — SODIUM CHLORIDE 0.9 % IV SOLN: INTRAVENOUS | Status: DC

## 2020-10-29 MED ORDER — CIPROFLOXACIN HCL 500 MG PO TABS
ORAL_TABLET | ORAL | Status: AC
  Administered 2020-10-29: 500 mg via ORAL
  Filled 2020-10-29: qty 1

## 2020-10-29 MED ORDER — DIPHENHYDRAMINE HCL 25 MG PO CAPS: 25.0000 mg | ORAL_CAPSULE | ORAL | Status: AC

## 2020-10-29 NOTE — Interval H&P Note (Signed)
History and Physical Interval Note:  10/29/2020 8:58 AM  John Hartman  has presented today for surgery, with the diagnosis of Kidney stone.  The various methods of treatment have been discussed with the patient and family. After consideration of risks, benefits and other options for treatment, the patient has consented to  Procedure(s): EXTRACORPOREAL SHOCK WAVE LITHOTRIPSY (ESWL) (Left) as a surgical intervention.  The patient's history has been reviewed, patient examined, no change in status, stable for surgery.  I have reviewed the patient's chart and labs.  Questions were answered to the patient's satisfaction.    RRR CTAB  Hollice Espy

## 2020-10-29 NOTE — ED Notes (Signed)
Hospital requested for patient

## 2020-10-29 NOTE — ED Notes (Signed)
Pt back from CT

## 2020-10-29 NOTE — ED Notes (Signed)
Pt monitoring reading 160s HR. Primary RN at bedside. Repeat EKG done. Pt was stating he was standing to use the bathroom. At rest pt HR back to 140s and EKG repeated. MD made aware.

## 2020-10-29 NOTE — ED Notes (Signed)
Patient AAOx3.  Skin warm and dry.  Patient moved to ED 6.

## 2020-10-29 NOTE — Discharge Instructions (Addendum)
See [facility] discharge instructions in chart.   AMBULATORY SURGERY  DISCHARGE INSTRUCTIONS   1) The drugs that you were given will stay in your system until tomorrow so for the next 24 hours you should not:  A) Drive an automobile B) Make any legal decisions C) Drink any alcoholic beverage   2) You may resume regular meals tomorrow.  Today it is better to start with liquids and gradually work up to solid foods.  You may eat anything you prefer, but it is better to start with liquids, then soup and crackers, and gradually work up to solid foods.   3) Please notify your doctor immediately if you have any unusual bleeding, trouble breathing, redness and pain at the surgery site, drainage, fever, or pain not relieved by medication.    4) Additional Instructions:  DR. Erlene Quan ADDING ZOFRAN FOR NAUSEA AND IBUPROFEN FOR PAIN TO YOUR SCRIPT SENT TO YOUR PHARMACY.        Please contact your physician with any problems or Same Day Surgery at [phone], Monday through Friday 6 am to 4 pm, or [place] [facility] number at [phone].

## 2020-10-29 NOTE — H&P (Signed)
History and Physical   John Hartman TDV:[id number] DOB: Jul 30, 1971 DOA: 10/29/2020  Referring MD/NP/PA: Dr. Archie Hartman  PCP: John Hartshorn, FNP   Outpatient Specialists: Dr. Hollice Hartman, urology  Patient coming from: Home  Chief Complaint: Nausea with vomiting  HPI: John Hartman is a 49 y.o. male with medical history significant of coronary artery disease, ischemic cardiomyopathy, hypertension, diabetes, irritable bowel syndrome, kidney stones status post ESWL today who started feeling significant nausea after the procedure and had intractable nausea vomiting.  Unable to keep his medications down.  He is got multiple episode with pain in the left side of his abdomen.  No diarrhea no sick contacts.  Blood pressure has gone up due to his inability to keep his medications down.  Came into the ER where he was seen and evaluated.  He denied any chest pain.  Denied any diaphoresis.  Patient's work-up however showed increased lactic acid and rising troponins.  No evidence of PE but she is persistently tachycardic.  He also was noted to have some perinephric bleed on CT.  Urology consulted and will see patient in the morning.  Recommendation is to avoid anticoagulation.  Patient being admitted to the medical service for further treatment and management.  Based on his presentation with a EKG and rising troponin non-ST elevation MI is being considered..  ED Course: Temperature is 96.5 blood pressure 190/139 pulse was 60 respiratory rate of 46 oxygen sat 91% on room air.  Chemistry showed glucose of 220 BUN of 13 creatinine 1.18.  LFTs slightly elevated lipase 46 but AST 70 ALT 77 total protein 9.0 total bili 3.0.  Initial troponin IV 08-second set 637.  Lactic acid 3.8 white count 15.4 otherwise rest of CBC within normal.  Urinalysis showed hazy urine with ketones proteinuria and RBC more than 50 WBC 6-10.  CT angiogram of the chest showed slightly suboptimal opacification of the main pulmonary artery no PE  streaky atelectasis in both lung bases.  CT abdomen pelvis shows post recent lithotripsy with left renal subcapsular hematoma of 2.6 x 6.3 x 7.4 cm and perinephric stranding.  Mild mass-effect.  There is still nonobstructing stones in the mid lower left kidney largest 90 mm and colonic diverticulosis.  Patient being admitted with possible NSTEMI.  Review of Systems: As per HPI otherwise 10 point review of systems negative.    Past Medical History:  Diagnosis Date   Angioedema    Bronchitis    DDD (degenerative disc disease), cervical    Diabetes mellitus ([place])    Fatty liver 03/31/2017   [place] April 2018   GERD (gastroesophageal reflux disease)    Gout    Heart attack ([place])    HTN (hypertension)    Hypertension    CONTROLLED ON MEDS   IBS (irritable bowel syndrome)    Kidney stone    LVH (left ventricular hypertrophy) 11/24/2018   Moderate, ECHO   Myocardial infarction ([place]) 08/23/2018   Seasonal allergies    Spinal stenosis     Past Surgical History:  Procedure Laterality Date   ACDF with fusion  2007   CARDIAC CATHETERIZATION Left 08/15/2016   Procedure: Left Heart Cath and Coronary Angiography;  Surgeon: John Hampshire, MD;  Location: [facility];  Service: Cardiovascular;  Laterality: Left;   COLONOSCOPY WITH PROPOFOL N/A 06/26/2015   Procedure: COLONOSCOPY WITH PROPOFOL;  Surgeon: John Lame, MD;  Location: [facility];  Service: Endoscopy;  Laterality: N/A;  with biopsies   CORONARY/GRAFT ACUTE MI  REVASCULARIZATION N/A 08/23/2018   Procedure: Coronary/Graft Acute MI Revascularization;  Surgeon: John Cowman, MD;  Location: [facility];  Service: Cardiovascular;  Laterality: N/A;   CYSTOSCOPY W/ RETROGRADES  02/10/2020   Procedure: CYSTOSCOPY WITH RETROGRADE PYELOGRAM;  Surgeon: Billey Co, MD;  Location: [facility];  Service: Urology;;   CYSTOSCOPY WITH BIOPSY N/A 11/22/2019   Procedure: CYSTOSCOPY WITH Bladder BIOPSY  & John Hartman;  Surgeon: Billey Co, MD;  Location: [facility];  Service: Urology;  Laterality: N/A;   CYSTOSCOPY/URETEROSCOPY/HOLMIUM LASER  02/10/2020   Procedure: CYSTOSCOPY/URETEROSCOPY/HOLMIUM LASER;  Surgeon: Billey Co, MD;  Location: [facility];  Service: Urology;;   CYSTOSCOPY/URETEROSCOPY/HOLMIUM LASER/STENT PLACEMENT Left 11/22/2019   Procedure: CYSTOSCOPY/URETEROSCOPY/BILATERAL RETROGRADE PYELOGRAM/HOLMIUM LASER/STENT PLACEMENT;  Surgeon: Billey Co, MD;  Location: [facility];  Service: Urology;  Laterality: Left;   LEFT HEART CATH AND CORONARY ANGIOGRAPHY N/A 08/23/2018   Procedure: LEFT HEART CATH AND CORONARY ANGIOGRAPHY;  Surgeon: John Cowman, MD;  Location: [facility];  Service: Cardiovascular;  Laterality: N/A;   LEFT HEART CATH AND CORONARY ANGIOGRAPHY N/A 02/10/2020   Procedure: LEFT HEART CATH AND CORONARY ANGIOGRAPHY;  Surgeon: Corey Skains, MD;  Location: [facility];  Service: Cardiovascular;  Laterality: N/A;   NASAL SEPTUM SURGERY  2004   SHOULDER ARTHROSCOPY W/ ROTATOR CUFF REPAIR  10/01/2019   SHOULDER SURGERY Left 2007   Testicular torsion  1980s   VASECTOMY       reports that he quit smoking about 17 years ago. His smoking use included cigarettes. He has a 10.00 pack-year smoking history. He has never used smokeless tobacco. He reports that he does not drink alcohol and does not use drugs.  Allergies  Allergen Reactions   Imdur [Isosorbide Nitrate]    Lisinopril Swelling   Other Hives    adhesives   Drug [Tape] Itching    Rash, blisters   Tapentadol Itching    Rash, blisters   Ace Inhibitors Swelling    Family History  Problem Relation Age of Onset   Breast cancer Mother    Skin cancer Mother    Arrhythmia Mother        A-fib   Cancer Mother        breast, colon?, skin   Hypertension Father    Diabetes Father    Cancer Maternal Grandfather    Heart disease Brother        stent    Allergies Son    Alzheimer's disease Maternal Grandmother    Stroke Neg Hx    COPD Neg Hx      Prior to Admission medications   Medication Sig Start Date End Date Taking? Authorizing Provider  allopurinol (ZYLOPRIM) 100 MG tablet Take 100 mg by mouth daily.    Yes [provider]  amLODipine (NORVASC) 10 MG tablet Take 1 tablet (10 mg total) by mouth daily. 02/13/20  Yes Nicole Kindred A, DO  aspirin EC 81 MG tablet Take 81 mg by mouth daily.   Yes [provider]  atorvastatin (LIPITOR) 80 MG tablet Take 1 tablet (80 mg total) by mouth daily at 6 PM. Patient taking differently: Take 80 mg by mouth daily with supper.  08/25/18 11/19/20 Yes Pyreddy, Reatha Harps, MD  cetirizine (ZYRTEC) 10 MG tablet Take 10 mg by mouth at bedtime.   Yes [provider]  Dapagliflozin-metFORMIN HCl ER (XIGDUO XR) 04-999 MG TB24 Take 1 tablet by mouth in the morning and at bedtime.    Yes  [provider]  diphenhydrAMINE (BENADRYL) 25 MG tablet Take 50 mg by mouth at bedtime.   Yes [provider]  ezetimibe (ZETIA) 10 MG tablet Take 1 tablet (10 mg total) by mouth daily. 12/03/18  Yes Lada, Satira Anis, MD  famotidine (PEPCID) 10 MG tablet Take 10 mg by mouth daily.   Yes [provider]  hydrALAZINE (APRESOLINE) 25 MG tablet Take 1 tablet (25 mg total) by mouth 2 (two) times daily. 08/25/18 11/19/20 Yes Pyreddy, Reatha Harps, MD  HYDROcodone-acetaminophen (NORCO/VICODIN) 5-325 MG tablet Take 1-2 tablets by mouth every 6 (six) hours as needed for moderate pain. 10/29/20  Yes John Espy, MD  ibuprofen (ADVIL) 800 MG tablet Take 1 tablet (800 mg total) by mouth every 8 (eight) hours as needed. 10/29/20  Yes John Espy, MD  Menthol, Topical Analgesic, (BIOFREEZE EX) Apply 1 application topically 3 (three) times daily as needed (muscle cramps/shoulder pain.).   Yes [provider]  metoprolol tartrate (LOPRESSOR) 25 MG tablet Take 25 mg by mouth 2 (two) times  daily. 08/22/19  Yes [provider]  nitroGLYCERIN (NITROSTAT) 0.4 MG SL tablet Place 1 tablet (0.4 mg total) under the tongue every 5 (five) minutes as needed for chest pain. 08/25/18  Yes Pyreddy, Reatha Harps, MD  ondansetron (ZOFRAN ODT) 4 MG disintegrating tablet Take 1 tablet (4 mg total) by mouth every 8 (eight) hours as needed for nausea or vomiting. 10/29/20  Yes John Espy, MD  ondansetron (ZOFRAN) 4 MG tablet Take 1 tablet (4 mg total) by mouth every 8 (eight) hours as needed for nausea or vomiting. 10/29/20  Yes John Espy, MD  pregabalin (LYRICA) 50 MG capsule Take 1 capsule (50 mg total) by mouth 3 (three) times daily. Patient taking differently: Take 50 mg by mouth 2 (two) times daily. 1 capsule in the morning and 2 capsules at night. 04/30/20  Yes Lateef, Carlus Pavlov, MD  RYBELSUS 3 MG TABS Take 3 mg by mouth daily at 6 (six) AM.    Yes [provider]  tamsulosin (FLOMAX) 0.4 MG CAPS capsule Take 1 capsule (0.4 mg total) by mouth daily. 10/29/20  Yes John Espy, MD  vitamin B-12 (CYANOCOBALAMIN) 1000 MCG tablet Take 1,000 mcg by mouth daily.    Yes [provider]  methocarbamol (ROBAXIN) 500 MG tablet Take 1 tablet (500 mg total) by mouth every 8 (eight) hours as needed for muscle spasms. Patient not taking: Reported on 10/29/2020 10/28/20   Gillis Santa, MD  [facility] ULTRA test strip SMARTSIG:Via Meter 08/19/20   [provider]    Physical Exam: Vitals:   10/29/20 2030 10/29/20 2100 10/29/20 2130 10/29/20 2200  BP: (!) 177/127 (!) 170/125 (!) 175/130 (!) 175/130  Pulse: (!) 128 (!) 129 (!) 137 (!) 112  Resp: (!) 21 (!) 21 (!) 23 (!) 25  Temp:      TempSrc:      SpO2: 92% 93% 94% 92%  Weight:      Height:          Constitutional: Acutely ill looking, currently vomiting Vitals:   10/29/20 2030 10/29/20 2100 10/29/20 2130 10/29/20 2200  BP: (!) 177/127 (!) 170/125 (!) 175/130 (!) 175/130  Pulse: (!) 128 (!) 129 (!) 137 (!) 112  Resp:  (!) 21 (!) 21 (!) 23 (!) 25  Temp:      TempSrc:      SpO2: 92% 93% 94% 92%  Weight:      Height:       Eyes: PERRL,  lids and conjunctivae normal ENMT: Mucous membranes are dry. Posterior pharynx clear of any exudate or lesions.Normal dentition.  Neck: normal, supple, no masses, no thyromegaly Respiratory: clear to auscultation bilaterally, no wheezing, no crackles. Normal respiratory effort. No accessory muscle use.  Cardiovascular: Sinus tachycardia, no murmurs / rubs / gallops. No extremity edema. 2+ pedal pulses. No carotid bruits.  Abdomen: no tenderness, no masses palpated. No hepatosplenomegaly. Bowel sounds positive.  Positive left CVA tenderness Musculoskeletal: no clubbing / cyanosis. No joint deformity upper and lower extremities. Good ROM, no contractures. Normal muscle tone.  Skin: no rashes, lesions, ulcers. No induration Neurologic: CN 2-12 grossly intact. Sensation intact, DTR normal. Strength 5/5 in all 4.  Psychiatric: Normal judgment and insight. Alert and oriented x 3.  Anxious mood.     Labs on Admission: I have personally reviewed following labs and imaging studies  CBC: Recent Labs  Lab 10/29/20 1730  WBC 15.4*  HGB 16.9  HCT 51.1  MCV 80.5  PLT 814   Basic Metabolic Panel: Recent Labs  Lab 10/29/20 1730  NA 137  K 4.6  CL 98  CO2 20*  GLUCOSE 220*  BUN 13  CREATININE 1.18  CALCIUM 9.5   GFR: Estimated Creatinine Clearance: 90.9 mL/min (by C-G formula based on SCr of 1.18 mg/dL). Liver Function Tests: Recent Labs  Lab 10/29/20 1730  AST 70*  ALT 77*  ALKPHOS 105  BILITOT 3.0*  PROT 9.0*  ALBUMIN 5.5*   Recent Labs  Lab 10/29/20 1730  LIPASE 46   No results for input(s): AMMONIA in the last 168 hours. Coagulation Profile: No results for input(s): INR, PROTIME in the last 168 hours. Cardiac Enzymes: No results for input(s): CKTOTAL, CKMB, CKMBINDEX, TROPONINI in the last 168 hours. BNP (last 3 results) No results for input(s):  PROBNP in the last 8760 hours. HbA1C: No results for input(s): HGBA1C in the last 72 hours. CBG: No results for input(s): GLUCAP in the last 168 hours. Lipid Profile: No results for input(s): CHOL, HDL, LDLCALC, TRIG, CHOLHDL, LDLDIRECT in the last 72 hours. Thyroid Function Tests: No results for input(s): TSH, T4TOTAL, FREET4, T3FREE, THYROIDAB in the last 72 hours. Anemia Panel: No results for input(s): VITAMINB12, FOLATE, FERRITIN, TIBC, IRON, RETICCTPCT in the last 72 hours. Urine analysis:    Component Value Date/Time   COLORURINE YELLOW (A) 10/29/2020 1852   APPEARANCEUR HAZY (A) 10/29/2020 1852   LABSPEC 1.012 10/29/2020 1852   PHURINE 5.0 10/29/2020 1852   GLUCOSEU >=500 (A) 10/29/2020 1852   HGBUR LARGE (A) 10/29/2020 1852   BILIRUBINUR NEGATIVE 10/29/2020 1852   KETONESUR 80 (A) 10/29/2020 1852   PROTEINUR 100 (A) 10/29/2020 1852   NITRITE NEGATIVE 10/29/2020 1852   LEUKOCYTESUR NEGATIVE 10/29/2020 1852   Sepsis Labs: @LABRCNTIP (procalcitonin:4,lacticidven:4) ) Recent Results (from the past 240 hour(s))  SARS CORONAVIRUS 2 (TAT 6-24 HRS) Nasopharyngeal Nasopharyngeal Swab     Status: None   Collection Time: 10/27/20  9:13 AM   Specimen: Nasopharyngeal Swab  Result Value Ref Range Status   SARS Coronavirus 2 NEGATIVE NEGATIVE Final    Comment: (NOTE) SARS-CoV-2 target nucleic acids are NOT DETECTED.  The SARS-CoV-2 RNA is generally detectable in upper and lower respiratory specimens during the acute phase of infection. Negative results do not preclude SARS-CoV-2 infection, do not rule out co-infections with other pathogens, and should not be used as the sole basis for treatment or other patient management decisions. Negative results must be combined with clinical observations, patient history, and epidemiological  information. The expected result is Negative.  Fact Sheet for Patients: SugarRoll.be  Fact Sheet for Healthcare  Providers: https://www.woods-mathews.com/  This test is not yet approved or cleared by the [place] FDA and  has been authorized for detection and/or diagnosis of SARS-CoV-2 by FDA under an Emergency Use Authorization (EUA). This EUA will remain  in effect (meaning this test can be used) for the duration of the COVID-19 declaration under Se ction 564(b)(1) of the Act, 21 U.S.C. section 360bbb-3(b)(1), unless the authorization is terminated or revoked sooner.  Performed at [facility], [place] [address] [place], [place] [address]      Radiological Exams on Admission: CT ABDOMEN PELVIS WO CONTRAST  Result Date: 10/29/2020 CLINICAL DATA:  Abdominal pain with nausea and vomiting. Lithotripsy today. EXAM: CT ABDOMEN AND PELVIS WITHOUT CONTRAST TECHNIQUE: Multidetector CT imaging of the abdomen and pelvis was performed following the standard protocol without IV contrast. COMPARISON:  Most recent CT 02/07/2020 FINDINGS: Lower chest: No pleural fluid or focal airspace disease. Coronary artery calcifications again seen. Hepatobiliary: Hepatic steatosis. No evidence of focal lesion on noncontrast exam. Gallbladder physiologically distended, no calcified stone. No biliary dilatation. Pancreas: No ductal dilatation or inflammation. Spleen: Normal in size without focal abnormality. Adrenals/Urinary Tract: No adrenal nodule. Hyperdense subcapsular collection in the mid and inferior left kidney measures approximately 2.6 x 6.3 x 7.4 cm and is typical of subcapsular hematoma. This causes mild mass effect on underlying renal parenchyma. Nonobstructing stones in the mid lower left kidney, largest stone or stone fragments in the lower pole span 19 mm, series 5, image 58. Moderate left perinephric stranding. There is no hydronephrosis. No ureteral calculi. Previous cyst in the left kidney is grossly stable from prior. No definite right renal calculi. No right hydronephrosis. There is mild right  perinephric edema. No right ureteral stone. Unremarkable urinary bladder.  There is no bladder stone. Stomach/Bowel: Stomach partially distended. There is no bowel obstruction or inflammation. Air-filled appendix without appendicitis. Small to moderate stool burden in the colon. Left colonic diverticulosis without diverticulitis. No colonic inflammation. Vascular/Lymphatic: Moderate aortic atherosclerosis. Mild branch atherosclerosis. No aortic aneurysm. No abdominopelvic adenopathy. Reproductive: Enlarged prostate gland spans 5.2 cm transverse. Other: No ascites or Hartman air. Musculoskeletal: Multilevel degenerative change throughout the spine. There are no acute or suspicious osseous abnormalities. IMPRESSION: 1. Post recent lithotripsy with left renal subcapsular hematoma measuring 2.6 x 6.3 x 7.4 cm and perinephric stranding. Hematoma causes mild mass effect on underlying renal parenchyma. Recommend correlation for hypertension in the setting (Page kidney). 2. Nonobstructing stones in the mid lower left kidney, largest stone or stone fragments in the lower pole span 19 mm. No hydronephrosis. No ureteral stone or calculi. 3. Colonic diverticulosis without diverticulitis. 4. Enlarged prostate gland. 5. Hepatic steatosis. Aortic Atherosclerosis (ICD10-I70.0). Electronically Signed   By: Keith Rake M.D.   On: 10/29/2020 19:44   DG Abd 1 View  Result Date: 10/29/2020 CLINICAL DATA:  Left renal stone EXAM: ABDOMEN - 1 VIEW COMPARISON:  10/13/2020 FINDINGS: A 6 mm calculus of the lower pole the left kidney is again identified. No additional calculi are identified. Bowel gas pattern is unremarkable. IMPRESSION: Unchanged 6 mm left renal calculus. Electronically Signed   By: Macy Mis M.D.   On: 10/29/2020 08:05   CT Angio Chest PE W and/or Wo Contrast  Result Date: 10/29/2020 CLINICAL DATA:  Tachycardia EXAM: CT ANGIOGRAPHY CHEST WITH CONTRAST TECHNIQUE: Multidetector CT imaging of the chest was  performed using the  standard protocol during bolus administration of intravenous contrast. Multiplanar CT image reconstructions and MIPs were obtained to evaluate the vascular anatomy. CONTRAST:  41mL OMNIPAQUE IOHEXOL 350 MG/ML SOLN COMPARISON:  None. FINDINGS: Cardiovascular: There is slightly suboptimal opacification of the main pulmonary artery, however no central or proximal segmental pulmonary embolism. The heart is normal in size. No pericardial effusion or thickening. No evidence right heart strain. There is normal three-vessel brachiocephalic anatomy without proximal stenosis. Scattered aortic atherosclerosis is noted. Coronary artery calcifications are seen. Mediastinum/Nodes: No hilar, mediastinal, or axillary adenopathy. Thyroid gland, trachea, and esophagus demonstrate no significant findings. Lungs/Pleura: Streaky airspace opacity is seen at the right lung base. No pleural effusion or pneumothorax. No airspace consolidation. Upper Abdomen: No acute abnormalities present in the visualized portions of the upper abdomen. Musculoskeletal: No chest wall abnormality. No acute or significant osseous findings. Review of the MIP images confirms the above findings. IMPRESSION: Slightly suboptimal opacification of the main pulmonary artery however no central or proximal segmental pulmonary embolism. Streaky atelectasis at both lung bases. No other acute intrathoracic pathology to explain the patient's symptoms. Electronically Signed   By: Prudencio Pair M.D.   On: 10/29/2020 22:31    EKG: Independently reviewed.  It shows sinus tachycardia with a rate of 144, nonspecific ST changes no significant ST elevation.  Assessment/Plan Principal Problem:   NSTEMI (non-ST elevated myocardial infarction) [facility]) Active Problems:   Essential hypertension   Coronary artery disease   Gilbert's syndrome   Diabetes mellitus without complication (HCC)   Chronic systolic CHF (congestive heart failure) (HCC)   HFrEF  (heart failure with reduced ejection fraction) (HCC)   Nephrolithiasis   OSA (obstructive sleep apnea)   Chronic pain syndrome   Perinephric hematoma     #1 non-ST elevation MI: Patient with history of coronary artery disease status post lithotripsy with now intractable nausea vomiting.  Probably angina equivalent.  Patient will be admitted and enzymes cycled.  Unfortunately patient cannot be on anticoagulation due to subcapsular hematoma.  For now we will admit and observe.  Get cardiology and urology consult in the morning for further advice.  #2 intractable nausea and vomiting.  Symptomatic treatment.  Probably related to procedure today but could also be related to non-ST elevation MI.  Will monitor and treat accordingly.  #3 coronary artery disease: As per #1 above.  #4 perinephric hematoma on the left: He has subcapsular hematoma which apparently occurs as a complication of lithotripsy.  Urology to follow in the morning.  #5 diabetes: Sliding scale insulin.  Continue close monitoring.  #6 history of nephrolithiasis: Status post ESWL.  Follow urology recommendations.  #7 systolic dysfunction CHF: Confirm on resume home regimen.  Echocardiogram reordered.  Last echo in February this year showed EF of 40 to 45%.  Cardiology to see patient in the morning.  #8 obstructive sleep apnea: CPAP at night.   DVT prophylaxis: SCD Code Status: Full code Family Communication: Wife at bedside Disposition Plan: Home Consults called: Dr. Glori Luis, urology.  Consult cardiology in the morning Admission status: Inpatient  Severity of Illness: The appropriate patient status for this patient is INPATIENT. Inpatient status is judged to be reasonable and necessary in order to provide the required intensity of service to ensure the patient's safety. The patient's presenting symptoms, physical exam findings, and initial radiographic and laboratory data in the context of their chronic comorbidities is felt  to place them at high risk for further clinical deterioration. Furthermore, it is not anticipated that the  patient will be medically stable for discharge from the hospital within 2 midnights of admission. The following factors support the patient status of inpatient.   " The patient's presenting symptoms include intractable nausea vomiting. " The worrisome physical exam findings include dehydration with some mild distress. " The initial radiographic and laboratory data are worrisome because of elevated troponin. " The chronic co-morbidities include coronary artery disease and nephrolithiasis.   * I certify that at the point of admission it is my clinical judgment that the patient will require inpatient hospital care spanning beyond 2 midnights from the point of admission due to high intensity of service, high risk for further deterioration and high frequency of surveillance required.Barbette Merino MD [facility] Pager [phone]  If 7PM-7AM, please contact night-coverage [email or web address] Password [facility]  10/29/2020, 11:36 PM

## 2020-10-29 NOTE — ED Notes (Signed)
Date and time results received: 10/29/20 11:15pm   Test:Troponin  Critical Value: 637  Name of Provider Notified: MD Archie Balboa

## 2020-10-29 NOTE — ED Triage Notes (Signed)
Patient presents with lightheadedness, N/V after lithotripsy procedure today. Reports symptoms started around 1:30pm once home and unable to keep anything down. Cardiac history of MI from stress of renal colic pain.

## 2020-10-29 NOTE — Telephone Encounter (Signed)
Patient's wife called regarding vomiting, has not improved since he was discharged from Lithotripsy. Per Dr. Thornell Sartorius to send in sublingual Zofran, if symptoms do not improve need to go to ER. Voiced understanding.

## 2020-10-29 NOTE — ED Notes (Signed)
Date and time results received: 10/29/20  8pm  Test: Lactic Acid  Critical Value: 3.8  Name of Provider Notified: MD. Archie Balboa

## 2020-10-29 NOTE — OR Nursing (Signed)
States nausea "comes and goes"; rates pain 5 "easing up"; had Norco x 1 and toradol in postop.  Refuses 2nd Norco, wants to go home.  Spouse requests zofran and toradol RX; discussed with Dr. Erlene Quan via secure - chat, states she will escribe zofran and ibuprofen (not toradol) to pt's pharmacy.  Patient and spouse aware of same.

## 2020-10-29 NOTE — ED Notes (Signed)
Spoke with lab , they will run troponin with green type I sent

## 2020-10-29 NOTE — ED Triage Notes (Signed)
First Nurse Note:  Arrives with c/o vomiting.  Patient had Lithotripsy done today and was discharged at around 1400.  Wife states patient unable to tolerate PO.  Patient has cardiac history and wife states last MI was from stress of renal colic pain.  AAOx3.  Appears nauseated.  Skin warm and dry. NAD

## 2020-10-29 NOTE — ED Notes (Signed)
MD at bedside. 

## 2020-10-29 NOTE — ED Notes (Signed)
Gone with CT

## 2020-10-29 NOTE — ED Notes (Signed)
Date and time results received: 10/29/20 10pm   Test: Troponin Critical Value: 408  Name of Provider Notified: Nance Pear MD

## 2020-10-29 NOTE — ED Notes (Signed)
Pt in CT.

## 2020-10-29 NOTE — ED Notes (Signed)
Pt provided ice water, ok per Dr Archie Balboa

## 2020-10-29 NOTE — ED Provider Notes (Signed)
[facility] Emergency Department Provider Note  ____________________________________________   I have reviewed the triage vital signs and the nursing notes.   HISTORY  Chief Complaint Post-op Problem   History limited by: Not Limited   HPI John Hartman is a 49 y.o. male who presents to the emergency department today because of concern for nausea and vomiting. The patient had a lithotripsy performed earlier today. Started feeling nauseaus shortly thereafter. Had multiple episodes of non bloody vomiting. This was accompanied by pain to the left side of his abdomen. The patient states that he has not had any diarrhea or recent change in his bowel movements. Has had some bloody urination since the procedure. Denies any fevers. Was not able to take his blood pressure medication today because of the nausea. Per family the patient has had issues with his abdomen in the past occasionally. Denies any abdominal surgeries.    Records reviewed. Per medical record review patient has a history of extracorporeal shock wave lithotripsy performed today.   Past Medical History:  Diagnosis Date  . Angioedema   . Bronchitis   . DDD (degenerative disc disease), cervical   . Diabetes mellitus ([place])   . Fatty liver 03/31/2017   [place] April 2018  . GERD (gastroesophageal reflux disease)   . Gout   . Heart attack ([place])   . HTN (hypertension)   . Hypertension    CONTROLLED ON MEDS  . IBS (irritable bowel syndrome)   . Kidney stone   . LVH (left ventricular hypertrophy) 11/24/2018   Moderate, ECHO  . Myocardial infarction ([place]) 08/23/2018  . Seasonal allergies   . Spinal stenosis     Patient Active Problem List   Diagnosis Date Noted  . Back muscle spasm 10/28/2020  . Chronic pain syndrome 04/30/2020  . Lumbar radiculopathy 04/30/2020  . Spinal stenosis of lumbar region with neurogenic claudication 04/30/2020  . S/P cervical spinal fusion 04/30/2020  . HFrEF (heart failure  with reduced ejection fraction) ([place]) 02/18/2020  . Nephrolithiasis 02/18/2020  . OSA (obstructive sleep apnea) 02/17/2020  . Right ureteral stone 02/07/2020  . Diabetes mellitus without complication ([place]) 44/96/7591  . Chronic systolic CHF (congestive heart failure) ([place]) 02/07/2020  . Unspecified inflammatory spondylopathy, 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[place]) 01/06/2020  . Arthritis of right acromioclavicular joint 09/23/2019  . Incomplete tear of right rotator cuff 09/23/2019  . 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 H/O non-ST elevation myocardial infarction (NSTEMI) 11/24/2018  . LVH (left ventricular hypertrophy) 11/24/2018  . 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 History of angioedema 09/01/2018  . NSTEMI (non-ST elevated myocardial infarction) ([place]) 08/22/2018  . Chronic lower back pain 08/04/2017  . Degenerative disc disease, lumbar 08/04/2017  . Lumbar facet arthropathy 08/04/2017  . Calcification of abdominal aorta (HCC) 08/04/2017  . NAFL (nonalcoholic fatty liver) 63/84/6659  . Hyperlipidemia LDL goal <70 01/04/2017  . Elevated serum glutamic pyruvic transaminase (SGPT) level 01/04/2017  . Coronary artery disease 08/25/2016  . Ketonuria 07/12/2016  . Glucosuria 07/12/2016  . 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 Family history of malignant neoplasm of gastrointestinal tract   . Benign neoplasm of rectosigmoid junction   . Essential hypertension 05/28/2015  . Gout 05/28/2015  . IBS (irritable bowel syndrome) 05/28/2015    Past Surgical History:  Procedure Laterality Date  . ACDF with fusion  2007  . CARDIAC CATHETERIZATION Left 08/15/2016   Procedure: Left Heart Cath and Coronary Angiography;  Surgeon: Wellington Hampshire, MD;  Location: [place] [facility];  Service: Cardiovascular;  Laterality: Left;  . COLONOSCOPY WITH PROPOFOL N/A 06/26/2015   Procedure: COLONOSCOPY WITH PROPOFOL;  Surgeon: Lucilla Lame, MD;  Location: [facility];  Service: Endoscopy;  Laterality: N/A;  with  biopsies  . CORONARY/GRAFT ACUTE MI REVASCULARIZATION N/A 08/23/2018   Procedure: Coronary/Graft Acute MI Revascularization;  Surgeon: Isaias Cowman, MD;  Location: [place] [facility];  Service: Cardiovascular;  Laterality: N/A;  . CYSTOSCOPY W/ RETROGRADES  02/10/2020   Procedure: CYSTOSCOPY WITH RETROGRADE PYELOGRAM;  Surgeon: Billey Co, MD;  Location: [facility];  Service: Urology;;  . Consuela Mimes WITH BIOPSY N/A 11/22/2019   Procedure: CYSTOSCOPY WITH Bladder BIOPSY & Almyra Free;  Surgeon: Billey Co, MD;  Location: [facility];  Service: Urology;  Laterality: N/A;  . CYSTOSCOPY/URETEROSCOPY/HOLMIUM LASER  02/10/2020   Procedure: CYSTOSCOPY/URETEROSCOPY/HOLMIUM LASER;  Surgeon: Billey Co, MD;  Location: [facility];  Service: Urology;;  . CYSTOSCOPY/URETEROSCOPY/HOLMIUM LASER/STENT PLACEMENT Left 11/22/2019   Procedure: CYSTOSCOPY/URETEROSCOPY/BILATERAL RETROGRADE PYELOGRAM/HOLMIUM LASER/STENT PLACEMENT;  Surgeon: Billey Co, MD;  Location: [facility];  Service: Urology;  Laterality: Left;  . LEFT HEART CATH AND CORONARY ANGIOGRAPHY N/A 08/23/2018   Procedure: LEFT HEART CATH AND CORONARY ANGIOGRAPHY;  Surgeon: Isaias Cowman, MD;  Location: [place] [facility];  Service: Cardiovascular;  Laterality: N/A;  . LEFT HEART CATH AND CORONARY ANGIOGRAPHY N/A 02/10/2020   Procedure: LEFT HEART CATH AND CORONARY ANGIOGRAPHY;  Surgeon: Corey Skains, MD;  Location: [place] [facility];  Service: Cardiovascular;  Laterality: N/A;  . NASAL SEPTUM SURGERY  2004  . SHOULDER ARTHROSCOPY W/ ROTATOR CUFF REPAIR  10/01/2019  . SHOULDER SURGERY Left 2007  . Testicular torsion  1980s  . VASECTOMY      Prior to Admission medications   Medication Sig Start Date End Date Taking? Authorizing Provider  allopurinol (ZYLOPRIM) 100 MG tablet Take 100 mg by mouth daily.     [provider]  amLODipine (NORVASC) 10 MG tablet Take 1 tablet (10 mg total) by mouth daily. 02/13/20    Ezekiel Slocumb, DO  aspirin EC 81 MG tablet Take 81 mg by mouth daily.    [provider]  atorvastatin (LIPITOR) 80 MG tablet Take 1 tablet (80 mg total) by mouth daily at 6 PM. Patient taking differently: Take 80 mg by mouth daily with supper.  08/25/18 11/19/20  Saundra Shelling, MD  cetirizine (ZYRTEC) 10 MG tablet Take 10 mg by mouth at bedtime.    [provider]  Dapagliflozin-metFORMIN HCl ER (XIGDUO XR) 04-999 MG TB24 Take 1 tablet by mouth in the morning and at bedtime.     [provider]  diphenhydrAMINE (BENADRYL) 25 MG tablet Take 50 mg by mouth at bedtime.    [provider]  ezetimibe (ZETIA) 10 MG tablet Take 1 tablet (10 mg total) by mouth daily. 12/03/18   Arnetha Courser, MD  famotidine (PEPCID) 10 MG tablet Take 10 mg by mouth daily.    [provider]  hydrALAZINE (APRESOLINE) 25 MG tablet Take 1 tablet (25 mg total) by mouth 2 (two) times daily. 08/25/18 11/19/20  Saundra Shelling, MD  HYDROcodone-acetaminophen (NORCO/VICODIN) 5-325 MG tablet Take 1-2 tablets by mouth every 6 (six) hours as needed for moderate pain. 10/29/20   Hollice Espy, MD  ibuprofen (ADVIL) 800 MG tablet Take 1 tablet (800 mg total) by mouth every 8 (eight) hours as needed. 10/29/20   Hollice Espy, MD  Menthol, Topical Analgesic, (BIOFREEZE EX) Apply 1 application topically 3 (three) times daily as needed (muscle  cramps/shoulder pain.).    [provider]  methocarbamol (ROBAXIN) 500 MG tablet Take 1 tablet (500 mg total) by mouth every 8 (eight) hours as needed for muscle spasms. 10/28/20   Gillis Santa, MD  metoprolol tartrate (LOPRESSOR) 25 MG tablet Take 25 mg by mouth 2 (two) times daily. 08/22/19   [provider]  nitroGLYCERIN (NITROSTAT) 0.4 MG SL tablet Place 1 tablet (0.4 mg total) under the tongue every 5 (five) minutes as needed for chest pain. 08/25/18   Saundra Shelling, MD  ondansetron (ZOFRAN ODT) 4 MG disintegrating tablet Take  1 tablet (4 mg total) by mouth every 8 (eight) hours as needed for nausea or vomiting. 10/29/20   Hollice Espy, MD  ondansetron (ZOFRAN) 4 MG tablet Take 1 tablet (4 mg total) by mouth every 8 (eight) hours as needed for nausea or vomiting. 10/29/20   Hollice Espy, MD  [facility] ULTRA test strip SMARTSIG:Via Meter 08/19/20   [provider]  pregabalin (LYRICA) 50 MG capsule Take 1 capsule (50 mg total) by mouth 3 (three) times daily. Patient taking differently: Take 50 mg by mouth 2 (two) times daily. 1 capsule in the morning and 2 capsules at night. 04/30/20   Gillis Santa, MD  RYBELSUS 3 MG TABS Take 3 mg by mouth daily at 6 (six) AM.     [provider]  tamsulosin (FLOMAX) 0.4 MG CAPS capsule Take 1 capsule (0.4 mg total) by mouth daily. 10/29/20   Hollice Espy, MD  vitamin B-12 (CYANOCOBALAMIN) 1000 MCG tablet Take 1,000 mcg by mouth daily.     [provider]    Allergies Imdur [isosorbide nitrate], Lisinopril, Other, Drug [tape], Tapentadol, and Ace inhibitors  Family History  Problem Relation Age of Onset  . Breast cancer Mother   . Skin cancer Mother   . Arrhythmia Mother        A-fib  . Cancer Mother        breast, colon?, skin  . Hypertension Father   . Diabetes Father   . Cancer Maternal Grandfather   . Heart disease Brother        stent  . Allergies Son   . Alzheimer's disease Maternal Grandmother   . Stroke Neg Hx   . COPD Neg Hx     Social History Social History   Tobacco Use  . Smoking status: Former Smoker    Packs/day: 1.00    Years: 10.00    Pack years: 10.00    Types: Cigarettes    Quit date: 04/15/2003    Years since quitting: 17.5  . Smokeless tobacco: Never Used  Vaping Use  . Vaping Use: Never used  Substance Use Topics  . Alcohol use: No    Alcohol/week: 0.0 standard drinks  . Drug use: No    Review of Systems Constitutional: No fever/chills Eyes: No visual changes. ENT: No sore throat. Cardiovascular:  Denies chest pain. Respiratory: Denies shortness of breath. Gastrointestinal: Positive for left sided abdominal/flank pain. Positive for nausea and vomiting.  Genitourinary: Negative for dysuria. Musculoskeletal: Negative for back pain. Skin: Negative for rash. Neurological: Negative for headaches, focal weakness or numbness.  ____________________________________________   PHYSICAL EXAM:  VITAL SIGNS: ED Triage Vitals  Enc Vitals Group     BP 10/29/20 1727 (!) 170/115     Pulse Rate 10/29/20 1727 (!) 155     Resp 10/29/20 1727 (!) 22     Temp 10/29/20 1727 97.9 F (36.6 C)     Temp Source  10/29/20 1727 Oral     SpO2 10/29/20 1727 100 %     Weight 10/29/20 1728 208 lb 5.4 oz (94.5 kg)     Height 10/29/20 1728 5\' 11"  (1.803 m)     Head Circumference --      Peak Flow --      Pain Score 10/29/20 1728 9     Pain Loc --      Pain Edu? --      Excl. in [place]? --      Constitutional: Alert and oriented.  Eyes: Conjunctivae are normal.  ENT      Head: Normocephalic and atraumatic.      Nose: No congestion/rhinnorhea.      Mouth/Throat: Mucous membranes are moist.      Neck: No stridor. Hematological/Lymphatic/Immunilogical: No cervical lymphadenopathy. Cardiovascular: Tachycardia, regular rhythm.  No murmurs, rubs, or gallops.  Respiratory: Normal respiratory effort without tachypnea nor retractions. Breath sounds are clear and equal bilaterally. No wheezes/rales/rhonchi. Gastrointestinal: Soft and minimally tender in the left abdomen. Genitourinary: Deferred Musculoskeletal: Normal range of motion in all extremities. No lower extremity edema. Neurologic:  Normal speech and language. No gross focal neurologic deficits are appreciated.  Skin:  Some bruising noted to left flank. Psychiatric: Mood and affect are normal. Speech and behavior are normal. Patient exhibits appropriate insight and judgment.  ____________________________________________    LABS (pertinent  positives/negatives)  CMP na 137, k 4.6, glu 220, ast 70, alt 77, t bili 3.0 CBC wbc 15.4, hgb 16.9, plt 254 Lipase 46 Trop hs 408 ____________________________________________   EKG  I, Nance Pear, attending physician, personally viewed and interpreted this EKG  EKG Time: 1852 Rate: 144 Rhythm: sinus tachycardia Axis: left axis deviation Intervals: qtc 442 QRS: narrow, q waves III, aVF ST changes: no st elevation Impression: abnormal ekg  ____________________________________________    RADIOLOGY  CT angio PE No acute embolism  CT abd/pel wo contrast Subcapsular hematoma in left kidney with some perinephric stranding. ____________________________________________   PROCEDURES  Procedures  ____________________________________________   INITIAL IMPRESSION / ASSESSMENT AND PLAN / ED COURSE  Pertinent labs & imaging results that were available during my care of the patient were reviewed by me and considered in my medical decision making (see chart for details).   Patient presented to the emergency department today with complaints of nausea and vomiting that started after a lithotripsy performed today.  Initial vital signs were concerning for significant tachycardia.  Did have some concerns for dehydration given that the patient had been having the vomiting.  Patient was started on IV fluids.  Did obtain a CT scan of the abdomen which showed a subcapsular hematoma of the left kidney secondary to recent lithotripsy. Discussed with Dr. Diamantina Providence with urology. No acute intervention required at this time. Even after IV fluids patient's heart rate continued to be elevated.  He was given IV doses of his home metoprolol given that he not taking that today.  Given recent procedure and continued elevated heart rate CT angio was performed to evaluate for PE.  This was negative.  Did not add a troponin to the initial blood work which should come back elevated.  Patient does have a  history of cardiac disease.  Discussed work-up and findings with patient.  Will require admission to the hospital.  ____________________________________________   FINAL CLINICAL IMPRESSION(S) / ED DIAGNOSES  Final diagnoses:  Sinus tachycardia  Elevated troponin  Hematoma     Note: This dictation was prepared with Dragon dictation. Any  transcriptional errors that result from this process are unintentional     Nance Pear, MD 10/30/20 1526

## 2020-10-29 NOTE — ED Notes (Signed)
Pharmacy at bedside

## 2020-10-30 ENCOUNTER — Inpatient Hospital Stay: Payer: No Typology Code available for payment source

## 2020-10-30 ENCOUNTER — Inpatient Hospital Stay
Admit: 2020-10-30 | Discharge: 2020-10-30 | Disposition: A | Discharge status: Home / Self Care | Payer: No Typology Code available for payment source | Attending: Internal Medicine | Admitting: Internal Medicine

## 2020-10-30 ENCOUNTER — Encounter: Payer: Self-pay | Admitting: Urology

## 2020-10-30 DIAGNOSIS — I214 Non-ST elevation (NSTEMI) myocardial infarction: Secondary | ICD-10-CM | POA: Diagnosis not present

## 2020-10-30 LAB — LIPID PANEL
Cholesterol: 116 mg/dL (ref 0–200)
HDL: 42 mg/dL (ref >40)
LDL Cholesterol: 60 mg/dL (ref 0–99)
Total CHOL/HDL Ratio: 2.8 RATIO
Triglycerides: 68 mg/dL (ref <150)
VLDL: 14 mg/dL (ref 0–40)

## 2020-10-30 LAB — BASIC METABOLIC PANEL WITH GFR
Anion gap: 15 (ref 5–15)
BUN: 12 mg/dL (ref 6–20)
CO2: 20 mmol/L — ABNORMAL LOW (ref 22–32)
Calcium: 9.3 mg/dL (ref 8.9–10.3)
Chloride: 100 mmol/L (ref 98–111)
Creatinine, Ser: 1.14 mg/dL (ref 0.61–1.24)
GFR, Estimated: >60 mL/min
Glucose, Bld: 195 mg/dL — ABNORMAL HIGH (ref 70–99)
Potassium: 4 mmol/L (ref 3.5–5.1)
Sodium: 135 mmol/L (ref 135–145)

## 2020-10-30 LAB — ECHOCARDIOGRAM COMPLETE
AR max vel: 2.94 cm2
AV Area VTI: 3.54 cm2
AV Area mean vel: 2.77 cm2
AV Mean grad: 3 mmHg
AV Peak grad: 4.7 mmHg
Ao pk vel: 1.08 m/s
Area-P 1/2: 6.71 cm2
Height: 71 in
S' Lateral: 2.65 cm
Weight: 3178.15 oz

## 2020-10-30 LAB — CBC
HCT: 52 % (ref 39.0–52.0)
Hemoglobin: 17.3 g/dL — ABNORMAL HIGH (ref 13.0–17.0)
MCH: 26.5 pg (ref 26.0–34.0)
MCHC: 33.3 g/dL (ref 30.0–36.0)
MCV: 79.5 fL — ABNORMAL LOW (ref 80.0–100.0)
Platelets: 244 10*3/uL (ref 150–400)
RBC: 6.54 MIL/uL — ABNORMAL HIGH (ref 4.22–5.81)
RDW: 13.5 % (ref 11.5–15.5)
WBC: 15.3 10*3/uL — ABNORMAL HIGH (ref 4.0–10.5)
nRBC: 0 % (ref 0.0–0.2)

## 2020-10-30 LAB — GLUCOSE, CAPILLARY
Glucose-Capillary: 205 mg/dL — ABNORMAL HIGH (ref 70–99)
Glucose-Capillary: 198 mg/dL — ABNORMAL HIGH (ref 70–99)
Glucose-Capillary: 176 mg/dL — ABNORMAL HIGH (ref 70–99)
Glucose-Capillary: 178 mg/dL — ABNORMAL HIGH (ref 70–99)
Glucose-Capillary: 169 mg/dL — ABNORMAL HIGH (ref 70–99)
Glucose-Capillary: 195 mg/dL — ABNORMAL HIGH (ref 70–99)
Glucose-Capillary: 162 mg/dL — ABNORMAL HIGH (ref 70–99)
Glucose-Capillary: 188 mg/dL — ABNORMAL HIGH (ref 70–99)
Glucose-Capillary: 176 mg/dL — ABNORMAL HIGH (ref 70–99)
Glucose-Capillary: 187 mg/dL — ABNORMAL HIGH (ref 70–99)

## 2020-10-30 LAB — BASIC METABOLIC PANEL
Anion gap: 14 (ref 5–15)
Anion gap: 11 (ref 5–15)
BUN: 18 mg/dL (ref 6–20)
BUN: 19 mg/dL (ref 6–20)
CO2: 23 mmol/L (ref 22–32)
CO2: 25 mmol/L (ref 22–32)
Calcium: 8.8 mg/dL — ABNORMAL LOW (ref 8.9–10.3)
Calcium: 9.1 mg/dL (ref 8.9–10.3)
Chloride: 101 mmol/L (ref 98–111)
Chloride: 103 mmol/L (ref 98–111)
Creatinine, Ser: 1.32 mg/dL — ABNORMAL HIGH (ref 0.61–1.24)
Creatinine, Ser: 1.23 mg/dL (ref 0.61–1.24)
GFR, Estimated: >60 mL/min (ref >60)
GFR, Estimated: >60 mL/min (ref >60)
Glucose, Bld: 180 mg/dL — ABNORMAL HIGH (ref 70–99)
Glucose, Bld: 172 mg/dL — ABNORMAL HIGH (ref 70–99)
Potassium: 3.8 mmol/L (ref 3.5–5.1)
Potassium: 3.6 mmol/L (ref 3.5–5.1)
Sodium: 138 mmol/L (ref 135–145)
Sodium: 139 mmol/L (ref 135–145)

## 2020-10-30 LAB — BLOOD GAS, ARTERIAL
Acid-base deficit: 0.3 mmol/L (ref 0.0–2.0)
Bicarbonate: 22.9 mmol/L (ref 20.0–28.0)
FIO2: 0.21
O2 Saturation: 95 %
Patient temperature: 37
pCO2 arterial: 33 mmHg (ref 32.0–48.0)
pH, Arterial: 7.45 (ref 7.350–7.450)
pO2, Arterial: 72 mmHg — ABNORMAL LOW (ref 83.0–108.0)

## 2020-10-30 LAB — TROPONIN I (HIGH SENSITIVITY)
Troponin I (High Sensitivity): 672 ng/L (ref <18)
Troponin I (High Sensitivity): 686 ng/L (ref <18)

## 2020-10-30 LAB — COMPREHENSIVE METABOLIC PANEL
ALT: 57 U/L — ABNORMAL HIGH (ref 0–44)
AST: 36 U/L (ref 15–41)
Albumin: 4.9 g/dL (ref 3.5–5.0)
Alkaline Phosphatase: 93 U/L (ref 38–126)
Anion gap: 16 — ABNORMAL HIGH (ref 5–15)
BUN: 17 mg/dL (ref 6–20)
CO2: 23 mmol/L (ref 22–32)
Calcium: 9.1 mg/dL (ref 8.9–10.3)
Chloride: 99 mmol/L (ref 98–111)
Creatinine, Ser: 1.35 mg/dL — ABNORMAL HIGH (ref 0.61–1.24)
GFR, Estimated: >60 mL/min (ref >60)
Glucose, Bld: 202 mg/dL — ABNORMAL HIGH (ref 70–99)
Potassium: 3.7 mmol/L (ref 3.5–5.1)
Sodium: 138 mmol/L (ref 135–145)
Total Bilirubin: 3.1 mg/dL — ABNORMAL HIGH (ref 0.3–1.2)
Total Protein: 8.4 g/dL — ABNORMAL HIGH (ref 6.5–8.1)

## 2020-10-30 LAB — HEMOGLOBIN A1C
Hgb A1c MFr Bld: 7.4 % — ABNORMAL HIGH (ref 4.8–5.6)
Mean Plasma Glucose: 165.68 mg/dL

## 2020-10-30 LAB — BETA-HYDROXYBUTYRIC ACID
Beta-Hydroxybutyric Acid: 2.65 mmol/L — ABNORMAL HIGH (ref 0.05–0.27)
Beta-Hydroxybutyric Acid: 1.64 mmol/L — ABNORMAL HIGH (ref 0.05–0.27)
Beta-Hydroxybutyric Acid: 0.48 mmol/L — ABNORMAL HIGH (ref 0.05–0.27)

## 2020-10-30 LAB — RESPIRATORY PANEL BY RT PCR (FLU A&B, COVID)
Influenza A by PCR: NEGATIVE
Influenza B by PCR: NEGATIVE
SARS Coronavirus 2 by RT PCR: NEGATIVE

## 2020-10-30 LAB — CBG MONITORING, ED: Glucose-Capillary: 181 mg/dL — ABNORMAL HIGH (ref 70–99)

## 2020-10-30 MED ORDER — PREGABALIN 50 MG PO CAPS
50.0000 mg | ORAL_CAPSULE | Freq: Two times a day (BID) | ORAL | Status: DC
  Administered 2020-10-31 – 2020-11-01 (×3): 50 mg via ORAL
  Filled 2020-10-30 (×3): qty 1

## 2020-10-30 MED ORDER — VITAMIN B-12 1000 MCG PO TABS
1000.0000 ug | ORAL_TABLET | Freq: Every day | ORAL | Status: DC
  Administered 2020-10-31 – 2020-11-01 (×2): 1000 ug via ORAL
  Filled 2020-10-30 (×2): qty 1

## 2020-10-30 MED ORDER — METOPROLOL TARTRATE 5 MG/5ML IV SOLN
10.0000 mg | Freq: Once | INTRAVENOUS | Status: AC
  Administered 2020-10-30: 10 mg via INTRAVENOUS
  Filled 2020-10-30: qty 10

## 2020-10-30 MED ORDER — ONDANSETRON 4 MG PO TBDP
4.0000 mg | ORAL_TABLET | Freq: Three times a day (TID) | ORAL | Status: DC | PRN
  Filled 2020-10-30: qty 1

## 2020-10-30 MED ORDER — SODIUM CHLORIDE 0.9 % IV BOLUS
500.0000 mL | Freq: Once | INTRAVENOUS | Status: AC
  Administered 2020-10-30: 500 mL via INTRAVENOUS

## 2020-10-30 MED ORDER — ATORVASTATIN CALCIUM 80 MG PO TABS
80.0000 mg | ORAL_TABLET | Freq: Every day | ORAL | Status: DC
  Administered 2020-10-31: 80 mg via ORAL
  Filled 2020-10-30: qty 4

## 2020-10-30 MED ORDER — DEXTROSE 50 % IV SOLN: 0.0000 mL | INTRAVENOUS | Status: DC | PRN

## 2020-10-30 MED ORDER — ONDANSETRON HCL 4 MG PO TABS: 4.0000 mg | ORAL_TABLET | Freq: Three times a day (TID) | ORAL | Status: DC | PRN

## 2020-10-30 MED ORDER — ALLOPURINOL 100 MG PO TABS
100.0000 mg | ORAL_TABLET | Freq: Every day | ORAL | Status: DC
  Administered 2020-10-31 – 2020-11-01 (×2): 100 mg via ORAL
  Filled 2020-10-30 (×3): qty 1

## 2020-10-30 MED ORDER — HYDRALAZINE HCL 25 MG PO TABS
25.0000 mg | ORAL_TABLET | Freq: Two times a day (BID) | ORAL | Status: DC
  Administered 2020-10-30 – 2020-11-01 (×4): 25 mg via ORAL
  Filled 2020-10-30 (×5): qty 1

## 2020-10-30 MED ORDER — SODIUM CHLORIDE 0.9 % IV SOLN: INTRAVENOUS | Status: DC

## 2020-10-30 MED ORDER — DILTIAZEM HCL 25 MG/5ML IV SOLN
INTRAVENOUS | Status: AC
  Administered 2020-10-30: 25 mg via INTRAVENOUS
  Filled 2020-10-30: qty 5

## 2020-10-30 MED ORDER — HYDRALAZINE HCL 20 MG/ML IJ SOLN
10.0000 mg | Freq: Four times a day (QID) | INTRAMUSCULAR | Status: DC | PRN
  Filled 2020-10-30 (×2): qty 1

## 2020-10-30 MED ORDER — METOPROLOL TARTRATE 25 MG PO TABS
25.0000 mg | ORAL_TABLET | Freq: Two times a day (BID) | ORAL | Status: DC
  Administered 2020-10-30: 25 mg via ORAL
  Filled 2020-10-30 (×2): qty 1

## 2020-10-30 MED ORDER — LORATADINE 10 MG PO TABS
10.0000 mg | ORAL_TABLET | Freq: Every day | ORAL | Status: DC
  Administered 2020-11-01: 10 mg via ORAL
  Filled 2020-10-30 (×2): qty 1

## 2020-10-30 MED ORDER — ACETAMINOPHEN 325 MG PO TABS: 650.0000 mg | ORAL_TABLET | ORAL | Status: DC | PRN

## 2020-10-30 MED ORDER — INSULIN REGULAR(HUMAN) IN NACL 100-0.9 UT/100ML-% IV SOLN
INTRAVENOUS | Status: DC
  Administered 2020-10-30: 5 [IU]/h via INTRAVENOUS
  Administered 2020-10-31: 8.5 [IU]/h via INTRAVENOUS
  Filled 2020-10-30 (×3): qty 100

## 2020-10-30 MED ORDER — DILTIAZEM HCL-DEXTROSE 125-5 MG/125ML-% IV SOLN (PREMIX)
5.0000 mg/h | INTRAVENOUS | Status: DC
  Administered 2020-10-30: 5 mg/h via INTRAVENOUS
  Administered 2020-10-31: 15 mg/h via INTRAVENOUS
  Administered 2020-11-01: 12.5 mg/h via INTRAVENOUS
  Filled 2020-10-30 (×4): qty 125

## 2020-10-30 MED ORDER — METHOCARBAMOL 500 MG PO TABS
500.0000 mg | ORAL_TABLET | Freq: Three times a day (TID) | ORAL | Status: DC | PRN
  Administered 2020-10-31 – 2020-11-01 (×2): 500 mg via ORAL
  Filled 2020-10-30 (×4): qty 1

## 2020-10-30 MED ORDER — ASPIRIN EC 81 MG PO TBEC: 81.0000 mg | DELAYED_RELEASE_TABLET | Freq: Every day | ORAL | Status: DC

## 2020-10-30 MED ORDER — SEMAGLUTIDE 3 MG PO TABS: 3.0000 mg | ORAL_TABLET | Freq: Every day | ORAL | Status: DC

## 2020-10-30 MED ORDER — INSULIN ASPART 100 UNIT/ML ~~LOC~~ SOLN: 0.0000 [IU] | Freq: Every day | SUBCUTANEOUS | Status: DC

## 2020-10-30 MED ORDER — LACTATED RINGERS IV SOLN: INTRAVENOUS | Status: DC

## 2020-10-30 MED ORDER — INSULIN ASPART 100 UNIT/ML ~~LOC~~ SOLN
0.0000 [IU] | Freq: Three times a day (TID) | SUBCUTANEOUS | Status: DC
  Administered 2020-10-30: 5 [IU] via SUBCUTANEOUS
  Administered 2020-10-30: 3 [IU] via SUBCUTANEOUS
  Filled 2020-10-30 (×2): qty 1

## 2020-10-30 MED ORDER — AMLODIPINE BESYLATE 10 MG PO TABS
10.0000 mg | ORAL_TABLET | Freq: Every day | ORAL | Status: DC
  Administered 2020-10-30: 10 mg via ORAL
  Filled 2020-10-30: qty 1

## 2020-10-30 MED ORDER — METOPROLOL TARTRATE 5 MG/5ML IV SOLN: 5.0000 mg | Freq: Once | INTRAVENOUS | Status: DC

## 2020-10-30 MED ORDER — DILTIAZEM HCL 25 MG/5ML IV SOLN
20.0000 mg | Freq: Once | INTRAVENOUS | Status: AC
  Administered 2020-10-30: 20 mg via INTRAVENOUS
  Filled 2020-10-30: qty 5

## 2020-10-30 MED ORDER — METOPROLOL TARTRATE 5 MG/5ML IV SOLN
5.0000 mg | Freq: Four times a day (QID) | INTRAVENOUS | Status: DC
  Administered 2020-10-30 (×2): 5 mg via INTRAVENOUS
  Filled 2020-10-30 (×2): qty 5

## 2020-10-30 MED ORDER — DIPHENHYDRAMINE HCL 25 MG PO CAPS
50.0000 mg | ORAL_CAPSULE | Freq: Every day | ORAL | Status: DC
  Administered 2020-10-31: 50 mg via ORAL
  Filled 2020-10-30 (×2): qty 2

## 2020-10-30 MED ORDER — DILTIAZEM HCL 25 MG/5ML IV SOLN: 25.0000 mg | Freq: Once | INTRAVENOUS | Status: AC

## 2020-10-30 MED ORDER — CHLORHEXIDINE GLUCONATE CLOTH 2 % EX PADS
6.0000 | MEDICATED_PAD | Freq: Every day | CUTANEOUS | Status: DC
  Administered 2020-10-30 – 2020-11-01 (×3): 6 via TOPICAL

## 2020-10-30 MED ORDER — ONDANSETRON HCL 4 MG/2ML IJ SOLN
4.0000 mg | Freq: Four times a day (QID) | INTRAMUSCULAR | Status: DC | PRN
  Administered 2020-10-30 (×2): 4 mg via INTRAVENOUS
  Filled 2020-10-30 (×4): qty 2

## 2020-10-30 MED ORDER — DEXTROSE IN LACTATED RINGERS 5 % IV SOLN: INTRAVENOUS | Status: DC

## 2020-10-30 MED ORDER — MUSCLE RUB 10-15 % EX CREA
1.0000 "application " | TOPICAL_CREAM | Freq: Three times a day (TID) | CUTANEOUS | Status: DC | PRN
  Filled 2020-10-30: qty 85

## 2020-10-30 MED ORDER — POTASSIUM CHLORIDE 10 MEQ/100ML IV SOLN
10.0000 meq | INTRAVENOUS | Status: AC
  Administered 2020-10-30 (×2): 10 meq via INTRAVENOUS
  Filled 2020-10-30 (×2): qty 100

## 2020-10-30 MED ORDER — IBUPROFEN 400 MG PO TABS: 800.0000 mg | ORAL_TABLET | Freq: Three times a day (TID) | ORAL | Status: DC | PRN

## 2020-10-30 MED ORDER — PROCHLORPERAZINE EDISYLATE 10 MG/2ML IJ SOLN
10.0000 mg | Freq: Four times a day (QID) | INTRAMUSCULAR | Status: DC | PRN
  Administered 2020-10-30 – 2020-10-31 (×2): 10 mg via INTRAVENOUS
  Filled 2020-10-30 (×5): qty 2

## 2020-10-30 MED ORDER — NITROGLYCERIN 0.4 MG SL SUBL: 0.4000 mg | SUBLINGUAL_TABLET | SUBLINGUAL | Status: DC | PRN

## 2020-10-30 MED ORDER — EZETIMIBE 10 MG PO TABS
10.0000 mg | ORAL_TABLET | Freq: Every day | ORAL | Status: DC
  Administered 2020-10-31 – 2020-11-01 (×2): 10 mg via ORAL
  Filled 2020-10-30 (×2): qty 1

## 2020-10-30 MED ORDER — HYDROCODONE-ACETAMINOPHEN 5-325 MG PO TABS: 1.0000 | ORAL_TABLET | Freq: Four times a day (QID) | ORAL | Status: DC | PRN

## 2020-10-30 MED ORDER — FAMOTIDINE 20 MG PO TABS
10.0000 mg | ORAL_TABLET | Freq: Every day | ORAL | Status: DC
  Administered 2020-10-31 – 2020-11-01 (×2): 10 mg via ORAL
  Filled 2020-10-30 (×2): qty 1

## 2020-10-30 MED ORDER — TAMSULOSIN HCL 0.4 MG PO CAPS
0.4000 mg | ORAL_CAPSULE | Freq: Every day | ORAL | Status: DC
  Administered 2020-10-30 – 2020-11-01 (×3): 0.4 mg via ORAL
  Filled 2020-10-30 (×3): qty 1

## 2020-10-30 NOTE — Progress Notes (Signed)
Patient reports no difficulty breathing. Does not wear and cpap at home and is not interested in wearing one.

## 2020-10-30 NOTE — Progress Notes (Signed)
   10/30/20 0809  Assess: MEWS Score  Temp 98.4 F (36.9 C)  BP (!) 174/124  Pulse Rate (!) 123  ECG Heart Rate (!) 116  Resp 18  Level of Consciousness Alert  SpO2 99 %  O2 Device Room Air  Assess: MEWS Score  MEWS Temp 0  MEWS Systolic 0  MEWS Pulse 2  MEWS RR 0  MEWS LOC 0  MEWS Score 2  MEWS Score Color Yellow  Assess: if the MEWS score is Yellow or Red  Were vital signs taken at a resting state? Yes  Focused Assessment No change from prior assessment  Early Detection of Sepsis Score *See Row Information* Low  MEWS guidelines implemented *See Row Information* No, previously yellow, continue vital signs every 4 hours  Treat  MEWS Interventions Other (Comment);Escalated (See documentation below) (Dr. Maylene Roes and Dr. Saralyn Pilar notified of elevated BP, HR)  Take Vital Signs  Increase Vital Sign Frequency  Yellow: Q 2hr X 2 then Q 4hr X 2, if remains yellow, continue Q 4hrs  Escalate  MEWS: Escalate Yellow: discuss with charge nurse/RN and consider discussing with provider and RRT  Notify: Charge Nurse/RN  Name of Charge Nurse/RN Notified Tammy   Date Charge Nurse/RN Notified 10/30/20  Time Charge Nurse/RN Notified 0809  Notify: Provider  Provider Name/Title Dr. Maylene Roes, Dr. Saralyn Pilar  Date Provider Notified 10/30/20  Time Provider Notified [phone]  Notification Type Face-to-face  Notification Reason Change in status  Response No new orders  Date of Provider Response 10/30/20  Document  Patient Outcome Other (Comment);Not stable and remains on department

## 2020-10-30 NOTE — Progress Notes (Signed)
Inpatient Diabetes Program Recommendations  AACE/ADA: New Consensus Statement on Inpatient Glycemic Control (2015)  Target Ranges:  Prepandial:   less than 140 mg/dL      Peak postprandial:   less than 180 mg/dL (1-2 hours)      Critically ill patients:  140 - 180 mg/dL   Results for AUDON, HEYMANN (MRN [id number]) as of 10/30/2020 14:30  Ref. Range 10/30/2020 01:09 10/30/2020 08:05 10/30/2020 11:25  Glucose-Capillary Latest Ref Range: 70 - 99 mg/dL 181 (H) 205 (H)  5 units NOVOLOG  198 (H)  3 units NOVOLOG    Results for JERRIS, FLEER (MRN [id number]) as of 10/30/2020 14:30  Ref. Range 10/30/2020 02:13  Beta-Hydroxybutyric Acid Latest Ref Range: 0.05 - 0.27 mmol/L 2.65 (H)    Admit left renal hematoma s/p Lithotripsy on 11/04 and Possible NSTEMI  History: DM2   Home DM Meds: Xigduo 04/999 mg BID       Rybelsus 3 mg Daily   Current Orders: IV Insulin Drip     Note that Beta-Hydroxybutyric Acid level elevated and plan is to transfer pt to step-down today and initiate IV Insulin Drip  Will follow over the weekend    --Will follow patient during hospitalization--  Wyn Quaker RN, MSN, CDE Diabetes Coordinator Inpatient Glycemic Control Team Team Pager: [phone] (8a-5p)

## 2020-10-30 NOTE — Progress Notes (Signed)
   10/30/20 1401  Assess: MEWS Score  Temp 98 F (36.7 C)  BP (!) 157/111  Pulse Rate (!) 113  SpO2 98 %  O2 Device Room Air  Assess: MEWS Score  MEWS Temp 0  MEWS Systolic 0  MEWS Pulse 2  MEWS RR 0  MEWS LOC 0  MEWS Score 2  MEWS Score Color Yellow  Assess: if the MEWS score is Yellow or Red  Were vital signs taken at a resting state? Yes  Focused Assessment No change from prior assessment  Early Detection of Sepsis Score *See Row Information* Low  MEWS guidelines implemented *See Row Information* No, previously yellow, continue vital signs every 4 hours  Document  Patient Outcome Other (Comment);Transferred/level of care increased (pt transferring to CCU 10)  Progress note created (see row info) Yes

## 2020-10-30 NOTE — ED Notes (Signed)
Transport to floor 253.AS

## 2020-10-30 NOTE — Progress Notes (Signed)
PROGRESS NOTE    John Hartman  ENI:[id number] DOB: 02-12-1971 DOA: 10/29/2020 PCP: John Hartshorn, FNP     Brief Narrative:  John Hartman is a 49 y.o. male with medical history significant of coronary artery disease, ischemic cardiomyopathy, hypertension, diabetes, irritable bowel syndrome, kidney stones status post lithotripsy 11/4 who started feeling significant nausea after the procedure and had intractable nausea vomiting.  Unable to keep his medications down.  He is got multiple episode with pain in the left side of his abdomen.  Patient's work-up however showed increased lactic acid and rising troponins.  Patient was admitted for intractable nausea, vomiting with concern for NSTEMI.  New events last 24 hours / Subjective: Continues to have nausea, vomiting.  No complaints of chest pain  Assessment & Plan:   Principal Problem:   NSTEMI (non-ST elevated myocardial infarction) ([place]) Active Problems:   Essential hypertension   Coronary artery disease   Gilbert's syndrome   Diabetes mellitus without complication (HCC)   Chronic systolic CHF (congestive heart failure) (HCC)   HFrEF (heart failure with reduced ejection fraction) (HCC)   Nephrolithiasis   OSA (obstructive sleep apnea)   Chronic pain syndrome   Perinephric hematoma   Intractable nausea, vomiting -Question of secondary to early DKA.  Beta hydroxybutyric acid, ABG pending.  He does have elevated anion gap, ketones in urine. -IV fluid and supportive care  Addendum: beta hydroxybutyric acid elevated, with combination of elevated AG of 19 and CO2 of 20 at admission, ketonuria, intractable nausea, vomiting, tachycardia. Blood glucose is not very impressive, but could be ?early DKA. Will start IV insulin and transfer to stepdown.  NSTEMI type II -Likely demand ischemia -EKG reviewed independently followed sinus tachycardia without ST elevation -Appreciate cardiology -Echocardiogram showed EF 60 to 65%, no regional wall  motion abnormality, grade 1 diastolic dysfunction -Denies chest pain -Cardiology following   Subcapsular hematoma status post lithotripsy -Urology following -We will hold aspirin, further ibuprofen or other blood thinning agents -Repeat H&H stable, continue to monitor  Hypertension -Continue Norvasc, Lopressor, hydralazine  HLD -Continue lipitor   OSA -CPAP nightly   DVT prophylaxis: Avoid anticoagulation due to renal hematoma SCDs Start: 10/30/20 0005  Code Status: Full code Family Communication: No family at bedside Disposition Plan:  Status is: Inpatient  Remains inpatient appropriate because:IV treatments appropriate due to intensity of illness or inability to take PO and Inpatient level of care appropriate due to severity of illness   Dispo: The patient is from: Home              Anticipated d/c is to: Home              Anticipated d/c date is: 1 day              Patient currently is not medically stable to d/c.  Continues to have nausea, vomiting, not tolerating p.o.   Consultants:   Cardiology  Urology   Antimicrobials:  Anti-infectives (From admission, onward)   Start     Dose/Rate Route Frequency Ordered Stop   10/29/20 2230  fluconazole (DIFLUCAN) tablet 150 mg  Status:  Discontinued        150 mg Oral  Once 10/29/20 2226 10/29/20 2227   10/29/20 2230  fluconazole (DIFLUCAN) tablet 200 mg        200 mg Oral  Once 10/29/20 2227 10/29/20 2247        Objective: Vitals:   10/30/20 0809 10/30/20 0955 10/30/20 1051 10/30/20 1100  BP: Marland Kitchen)  174/124 (!) 153/123 (!) 171/120 (!) 157/117  Pulse:  (!) 127 (!) 130 (!) 128  Resp:      Temp:  98.1 F (36.7 C) 98.1 F (36.7 C) 98.1 F (36.7 C)  TempSrc:  Oral  Oral  SpO2: 99% 96% 95% 96%  Weight:      Height:        Intake/Output Summary (Last 24 hours) at 10/30/2020 1243 Last data filed at 10/30/2020 1062 Gross per 24 hour  Intake 1000 ml  Output 2425 ml  Net -1425 ml   Filed Weights   10/29/20  1728 10/30/20 0611  Weight: 94.5 kg 90.1 kg    Examination:  General exam: Appears calm Respiratory system: Clear to auscultation. Respiratory effort normal. No respiratory distress. No conversational dyspnea.  Cardiovascular system: S1 & S2 heard, tachycardic, regular rhythm. No murmurs. No pedal edema. Gastrointestinal system: Abdomen is nondistended, soft and nontender. Normal bowel sounds heard. Central nervous system: Alert and oriented. No focal neurological deficits. Speech clear.  Extremities: Symmetric in appearance  Skin: No rashes, lesions or ulcers on exposed skin  Psychiatry: Judgement and insight appear normal. Mood & affect appropriate.   Data Reviewed: I have personally reviewed following labs and imaging studies  CBC: Recent Labs  Lab 10/29/20 1730 10/30/20 1055  WBC 15.4* 15.3*  HGB 16.9 17.3*  HCT 51.1 52.0  MCV 80.5 79.5*  PLT 254 694   Basic Metabolic Panel: Recent Labs  Lab 10/29/20 1730 10/30/20 1055  NA 137 138  K 4.6 3.7  CL 98 99  CO2 20* 23  GLUCOSE 220* 202*  BUN 13 17  CREATININE 1.18 1.35*  CALCIUM 9.5 9.1   GFR: Estimated Creatinine Clearance: 72 mL/min (A) (by C-G formula based on SCr of 1.35 mg/dL (H)). Liver Function Tests: Recent Labs  Lab 10/29/20 1730 10/30/20 1055  AST 70* 36  ALT 77* 57*  ALKPHOS 105 93  BILITOT 3.0* 3.1*  PROT 9.0* 8.4*  ALBUMIN 5.5* 4.9   Recent Labs  Lab 10/29/20 1730  LIPASE 46   No results for input(s): AMMONIA in the last 168 hours. Coagulation Profile: No results for input(s): INR, PROTIME in the last 168 hours. Cardiac Enzymes: No results for input(s): CKTOTAL, CKMB, CKMBINDEX, TROPONINI in the last 168 hours. BNP (last 3 results) No results for input(s): PROBNP in the last 8760 hours. HbA1C: Recent Labs    10/30/20 0027  HGBA1C 7.4*   CBG: Recent Labs  Lab 10/30/20 0109 10/30/20 0805 10/30/20 1125  GLUCAP 181* 205* 198*   Lipid Profile: Recent Labs    10/30/20 0027    CHOL 116  HDL 42  LDLCALC 60  TRIG 68  CHOLHDL 2.8   Thyroid Function Tests: No results for input(s): TSH, T4TOTAL, FREET4, T3FREE, THYROIDAB in the last 72 hours. Anemia Panel: No results for input(s): VITAMINB12, FOLATE, FERRITIN, TIBC, IRON, RETICCTPCT in the last 72 hours. Sepsis Labs: Recent Labs  Lab 10/29/20 1934 10/29/20 2136  LATICACIDVEN 3.8* 1.5    Recent Results (from the past 240 hour(s))  SARS CORONAVIRUS 2 (TAT 6-24 HRS) Nasopharyngeal Nasopharyngeal Swab     Status: None   Collection Time: 10/27/20  9:13 AM   Specimen: Nasopharyngeal Swab  Result Value Ref Range Status   SARS Coronavirus 2 NEGATIVE NEGATIVE Final    Comment: (NOTE) SARS-CoV-2 target nucleic acids are NOT DETECTED.  The SARS-CoV-2 RNA is generally detectable in upper and lower respiratory specimens during the acute phase of infection. Negative results do  not preclude SARS-CoV-2 infection, do not rule out co-infections with other pathogens, and should not be used as the sole basis for treatment or other patient management decisions. Negative results must be combined with clinical observations, patient history, and epidemiological information. The expected result is Negative.  Fact Sheet for Patients: SugarRoll.be  Fact Sheet for Healthcare Providers: https://www.woods-mathews.com/  This test is not yet approved or cleared by the [place] FDA and  has been authorized for detection and/or diagnosis of SARS-CoV-2 by FDA under an Emergency Use Authorization (EUA). This EUA will remain  in effect (meaning this test can be used) for the duration of the COVID-19 declaration under Se ction 564(b)(1) of the Act, 21 U.S.C. section 360bbb-3(b)(1), unless the authorization is terminated or revoked sooner.  Performed at [facility], [place] [address], [place], [place] [address]   Respiratory Panel by RT PCR (Flu A&B, Covid) - Nasopharyngeal Swab      Status: None   Collection Time: 10/30/20 12:27 AM   Specimen: Nasopharyngeal Swab  Result Value Ref Range Status   SARS Coronavirus 2 by RT PCR NEGATIVE NEGATIVE Final    Comment: (NOTE) SARS-CoV-2 target nucleic acids are NOT DETECTED.  The SARS-CoV-2 RNA is generally detectable in upper respiratoy specimens during the acute phase of infection. The lowest concentration of SARS-CoV-2 viral copies this assay can detect is 131 copies/mL. A negative result does not preclude SARS-Cov-2 infection and should not be used as the sole basis for treatment or other patient management decisions. A negative result may occur with  improper specimen collection/handling, submission of specimen other than nasopharyngeal swab, presence of viral mutation(s) within the areas targeted by this assay, and inadequate number of viral copies (<131 copies/mL). A negative result must be combined with clinical observations, patient history, and epidemiological information. The expected result is Negative.  Fact Sheet for Patients:  PinkCheek.be  Fact Sheet for Healthcare Providers:  GravelBags.it  This test is no t yet approved or cleared by the [place] FDA and  has been authorized for detection and/or diagnosis of SARS-CoV-2 by FDA under an Emergency Use Authorization (EUA). This EUA will remain  in effect (meaning this test can be used) for the duration of the COVID-19 declaration under Section 564(b)(1) of the Act, 21 U.S.C. section 360bbb-3(b)(1), unless the authorization is terminated or revoked sooner.     Influenza A by PCR NEGATIVE NEGATIVE Final   Influenza B by PCR NEGATIVE NEGATIVE Final    Comment: (NOTE) The Xpert Xpress SARS-CoV-2/FLU/RSV assay is intended as an aid in  the diagnosis of influenza from Nasopharyngeal swab specimens and  should not be used as a sole basis for treatment. Nasal washings and  aspirates are  unacceptable for Xpert Xpress SARS-CoV-2/FLU/RSV  testing.  Fact Sheet for Patients: PinkCheek.be  Fact Sheet for Healthcare Providers: GravelBags.it  This test is not yet approved or cleared by the [place] FDA and  has been authorized for detection and/or diagnosis of SARS-CoV-2 by  FDA under an Emergency Use Authorization (EUA). This EUA will remain  in effect (meaning this test can be used) for the duration of the  Covid-19 declaration under Section 564(b)(1) of the Act, 21  U.S.C. section 360bbb-3(b)(1), unless the authorization is  terminated or revoked. Performed at [facility], [address], [place], [place] [address]       Radiology Studies: CT ABDOMEN PELVIS WO CONTRAST  Result Date: 10/29/2020 CLINICAL DATA:  Abdominal pain with nausea and vomiting. Lithotripsy  today. EXAM: CT ABDOMEN AND PELVIS WITHOUT CONTRAST TECHNIQUE: Multidetector CT imaging of the abdomen and pelvis was performed following the standard protocol without IV contrast. COMPARISON:  Most recent CT 02/07/2020 FINDINGS: Lower chest: No pleural fluid or focal airspace disease. Coronary artery calcifications again seen. Hepatobiliary: Hepatic steatosis. No evidence of focal lesion on noncontrast exam. Gallbladder physiologically distended, no calcified stone. No biliary dilatation. Pancreas: No ductal dilatation or inflammation. Spleen: Normal in size without focal abnormality. Adrenals/Urinary Tract: No adrenal nodule. Hyperdense subcapsular collection in the mid and inferior left kidney measures approximately 2.6 x 6.3 x 7.4 cm and is typical of subcapsular hematoma. This causes mild mass effect on underlying renal parenchyma. Nonobstructing stones in the mid lower left kidney, largest stone or stone fragments in the lower pole span 19 mm, series 5, image 58. Moderate left perinephric stranding. There is no hydronephrosis. No ureteral  calculi. Previous cyst in the left kidney is grossly stable from prior. No definite right renal calculi. No right hydronephrosis. There is mild right perinephric edema. No right ureteral stone. Unremarkable urinary bladder.  There is no bladder stone. Stomach/Bowel: Stomach partially distended. There is no bowel obstruction or inflammation. Air-filled appendix without appendicitis. Small to moderate stool burden in the colon. Left colonic diverticulosis without diverticulitis. No colonic inflammation. Vascular/Lymphatic: Moderate aortic atherosclerosis. Mild branch atherosclerosis. No aortic aneurysm. No abdominopelvic adenopathy. Reproductive: Enlarged prostate gland spans 5.2 cm transverse. Other: No ascites or free air. Musculoskeletal: Multilevel degenerative change throughout the spine. There are no acute or suspicious osseous abnormalities. IMPRESSION: 1. Post recent lithotripsy with left renal subcapsular hematoma measuring 2.6 x 6.3 x 7.4 cm and perinephric stranding. Hematoma causes mild mass effect on underlying renal parenchyma. Recommend correlation for hypertension in the setting (Page kidney). 2. Nonobstructing stones in the mid lower left kidney, largest stone or stone fragments in the lower pole span 19 mm. No hydronephrosis. No ureteral stone or calculi. 3. Colonic diverticulosis without diverticulitis. 4. Enlarged prostate gland. 5. Hepatic steatosis. Aortic Atherosclerosis (ICD10-I70.0). Electronically Signed   By: Keith Rake M.D.   On: 10/29/2020 19:44   DG Abd 1 View  Result Date: 10/29/2020 CLINICAL DATA:  Left renal stone EXAM: ABDOMEN - 1 VIEW COMPARISON:  10/13/2020 FINDINGS: A 6 mm calculus of the lower pole the left kidney is again identified. No additional calculi are identified. Bowel gas pattern is unremarkable. IMPRESSION: Unchanged 6 mm left renal calculus. Electronically Signed   By: Macy Mis M.D.   On: 10/29/2020 08:05   CT Angio Chest PE W and/or Wo  Contrast  Result Date: 10/29/2020 CLINICAL DATA:  Tachycardia EXAM: CT ANGIOGRAPHY CHEST WITH CONTRAST TECHNIQUE: Multidetector CT imaging of the chest was performed using the standard protocol during bolus administration of intravenous contrast. Multiplanar CT image reconstructions and MIPs were obtained to evaluate the vascular anatomy. CONTRAST:  67mL OMNIPAQUE IOHEXOL 350 MG/ML SOLN COMPARISON:  None. FINDINGS: Cardiovascular: There is slightly suboptimal opacification of the main pulmonary artery, however no central or proximal segmental pulmonary embolism. The heart is normal in size. No pericardial effusion or thickening. No evidence right heart strain. There is normal three-vessel brachiocephalic anatomy without proximal stenosis. Scattered aortic atherosclerosis is noted. Coronary artery calcifications are seen. Mediastinum/Nodes: No hilar, mediastinal, or axillary adenopathy. Thyroid gland, trachea, and esophagus demonstrate no significant findings. Lungs/Pleura: Streaky airspace opacity is seen at the right lung base. No pleural effusion or pneumothorax. No airspace consolidation. Upper Abdomen: No acute abnormalities present in the visualized portions of the  upper abdomen. Musculoskeletal: No chest wall abnormality. No acute or significant osseous findings. Review of the MIP images confirms the above findings. IMPRESSION: Slightly suboptimal opacification of the main pulmonary artery however no central or proximal segmental pulmonary embolism. Streaky atelectasis at both lung bases. No other acute intrathoracic pathology to explain the patient's symptoms. Electronically Signed   By: Prudencio Pair M.D.   On: 10/29/2020 22:31   DG Chest Port 1 View  Result Date: 10/30/2020 CLINICAL DATA:  Tachycardia EXAM: PORTABLE CHEST 1 VIEW COMPARISON:  06/17/2020 FINDINGS: Heart and mediastinal contours are within normal limits. No focal opacities or effusions. No acute bony abnormality. IMPRESSION: No active  disease. Electronically Signed   By: Rolm Baptise M.D.   On: 10/30/2020 03:30   ECHOCARDIOGRAM COMPLETE  Result Date: 10/30/2020    ECHOCARDIOGRAM REPORT   Patient Name:   John Hartman Date of Exam: 10/30/2020 Medical Rec #:  [id number]   Height:       71.0 in Accession #:    [id number]  Weight:       198.6 lb Date of Birth:  May 05, 1971    BSA:          2.102 m Patient Age:    44 years    BP:           174/124 mmHg Patient Gender: M           HR:           90 bpm. Exam Location:  [facility] Procedure: 2D Echo, Cardiac Doppler and Color Doppler Indications:     NSTEMI 121.4  History:         Patient has prior history of Echocardiogram examinations, most                  recent 01/29/2020. Previous Myocardial Infarction; Risk                  Factors:Hypertension and Diabetes. LVH.  Sonographer:     Sherrie Sport RDCS (AE) Referring Phys:  [place] Diagnosing Phys: Isaias Cowman MD IMPRESSIONS  1. Left ventricular ejection fraction, by estimation, is 60 to 65%. The left ventricle has hyperdynamic function. The left ventricle has no regional wall motion abnormalities. Left ventricular diastolic parameters are consistent with Grade I diastolic dysfunction (impaired relaxation).  2. Right ventricular systolic function is normal. The right ventricular size is normal.  3. The mitral valve is normal in structure. Trivial mitral valve regurgitation. No evidence of mitral stenosis.  4. The aortic valve is normal in structure. Aortic valve regurgitation is not visualized. No aortic stenosis is present.  5. The inferior vena cava is normal in size with greater than 50% respiratory variability, suggesting right atrial pressure of 3 mmHg. FINDINGS  Left Ventricle: Left ventricular ejection fraction, by estimation, is 60 to 65%. The left ventricle has hyperdynamic function. The left ventricle has no regional wall motion abnormalities. The left ventricular internal cavity size was normal in size. There is no left  ventricular hypertrophy. Left ventricular diastolic parameters are consistent with Grade I diastolic dysfunction (impaired relaxation). Right Ventricle: The right ventricular size is normal. No increase in right ventricular wall thickness. Right ventricular systolic function is normal. Left Atrium: Left atrial size was normal in size. Right Atrium: Right atrial size was normal in size. Pericardium: There is no evidence of pericardial effusion. Mitral Valve: The mitral valve is normal in structure. Trivial mitral valve regurgitation. No evidence of mitral valve stenosis. Tricuspid  Valve: The tricuspid valve is normal in structure. Tricuspid valve regurgitation is trivial. No evidence of tricuspid stenosis. Aortic Valve: The aortic valve is normal in structure. Aortic valve regurgitation is not visualized. No aortic stenosis is present. Aortic valve mean gradient measures 3.0 mmHg. Aortic valve peak gradient measures 4.7 mmHg. Aortic valve area, by VTI measures 3.54 cm. Pulmonic Valve: The pulmonic valve was normal in structure. Pulmonic valve regurgitation is not visualized. No evidence of pulmonic stenosis. Aorta: The aortic root is normal in size and structure. Venous: The inferior vena cava is normal in size with greater than 50% respiratory variability, suggesting right atrial pressure of 3 mmHg. IAS/Shunts: No atrial level shunt detected by color flow Doppler.  LEFT VENTRICLE PLAX 2D LVIDd:         4.22 cm  Diastology LVIDs:         2.65 cm  LV e' medial:    6.53 cm/s LV PW:         1.09 cm  LV E/e' medial:  10.5 LV IVS:        0.98 cm  LV e' lateral:   11.60 cm/s LVOT diam:     2.20 cm  LV E/e' lateral: 5.9 LV SV:         46 LV SV Index:   22 LVOT Area:     3.80 cm  RIGHT VENTRICLE RV Basal diam:  3.34 cm RV S prime:     23.50 cm/s TAPSE (M-mode): 3.9 cm LEFT ATRIUM             Index       RIGHT ATRIUM           Index LA diam:        4.10 cm 1.95 cm/m  RA Area:     18.40 cm LA Vol (A2C):   34.1 ml 16.22  ml/m RA Volume:   46.05 ml  21.90 ml/m LA Vol (A4C):   27.2 ml 12.94 ml/m LA Biplane Vol: 32.1 ml 15.27 ml/m  AORTIC VALVE                   PULMONIC VALVE AV Area (Vmax):    2.94 cm    PV Vmax:        1.13 m/s AV Area (Vmean):   2.77 cm    PV Peak grad:   5.1 mmHg AV Area (VTI):     3.54 cm    RVOT Peak grad: 8 mmHg AV Vmax:           108.00 cm/s AV Vmean:          73.900 cm/s AV VTI:            0.130 m AV Peak Grad:      4.7 mmHg AV Mean Grad:      3.0 mmHg LVOT Vmax:         83.60 cm/s LVOT Vmean:        53.900 cm/s LVOT VTI:          0.121 m LVOT/AV VTI ratio: 0.93  AORTA Ao Root diam: 3.50 cm MITRAL VALVE               TRICUSPID VALVE MV Area (PHT): 6.71 cm    TR Peak grad:   15.1 mmHg MV Decel Time: 113 msec    TR Vmax:        194.00 cm/s MV E velocity: 68.60 cm/s MV A velocity: 98.10 cm/s  SHUNTS  MV E/A ratio:  0.70        Systemic VTI:  0.12 m                            Systemic Diam: 2.20 cm Isaias Cowman MD Electronically signed by Isaias Cowman MD Signature Date/Time: 10/30/2020/10:05:58 AM    Final       Scheduled Meds: . allopurinol  100 mg Oral Daily  . amLODipine  10 mg Oral Daily  . atorvastatin  80 mg Oral Q supper  . diphenhydrAMINE  50 mg Oral QHS  . ezetimibe  10 mg Oral Daily  . famotidine  10 mg Oral Daily  . hydrALAZINE  25 mg Oral BID  . insulin aspart  0-15 Units Subcutaneous TID WC  . insulin aspart  0-5 Units Subcutaneous QHS  . loratadine  10 mg Oral Daily  . metoprolol tartrate  25 mg Oral BID  . pregabalin  50 mg Oral BID  . tamsulosin  0.4 mg Oral Daily  . vitamin B-12  1,000 mcg Oral Daily   Continuous Infusions: . sodium chloride       LOS: 1 day      Time spent: 45 minutes   Dessa Phi, DO [facility] 10/30/2020, 12:43 PM   Available via Epic secure chat 7am-7pm After these hours, please refer to coverage provider listed on [email or web address]

## 2020-10-30 NOTE — ED Notes (Signed)
Pt transported to floor via ED tech

## 2020-10-30 NOTE — Progress Notes (Signed)
Pt to be transferred to CCU 10 per Dr. Maylene Roes,  Report given to [facility]. Belonging sent with pt, wife at bed side and aware.

## 2020-10-30 NOTE — Progress Notes (Signed)
Pt HR continues to be elevated, sinus tach in the 120's-130's BP 157/117. SPO 96% RR 18 on R/A. Pt continuous to be nauseous, emesis dark red in color. Dr. Maylene Roes notified. Will order IV metoprolol 10 mg, and 500 cc fluid bolus. Advised per pharmacist to give 5 mg of IV metoprolol wait at least 5 min before administering other 5 mg. MEWs continues to be a YELLOW. Will give and continue to monitor closely.

## 2020-10-30 NOTE — Consult Note (Signed)
Urology Consult  I have been asked to see the patient by Dr. Jonelle Sidle, for evaluation and management of a perinephric hematoma on the left.  Chief Complaint: Intractable nausea and vomiting associated with left-sided flank pain.  History of Present Illness: John Hartman is a 49 y.o. year old male with coronary artery disease, ischemic cardiomyopathy, hypertension, diabetes, irritable bowel syndrome and nephrolithiasis who presented to the emergency room yesterday after experiencing intractable nausea and vomiting and pain on the left side of his abdomen.  Patient underwent left sided ESWL for 6 mm renal stone yesterday.  At home last evening, he started to develop intractable nausea and vomiting and was unable to keep his medications down.  He was also experiencing gross hematuria and left-sided flank pain.  He then sought treatment in the emergency department.  In the ED he was found to have a 7.5 subscapular left renal hematoma, increased lactic acid and rising troponins.  Patient was admitted for possible NSTEMI.  Cardiology has been consulted.  Patient has a prior history of nephrolithiasis with his most recent stone treatment for 7 mm right proximal ureteral stone with ureteroscopy while he was hospitalized in February 2021 with an NSTEMI.  This morning, he states he is tired and is having some left-sided discomfort and red clear urine.  Hematocrit was 51.1%, his serum creatinine was 1.18 and WBC count 15.4.  This morning labs are still pending.  Troponin levels this morning are 672   Past Medical History:  Diagnosis Date  . Angioedema   . Bronchitis   . DDD (degenerative disc disease), cervical   . Diabetes mellitus ([place])   . Fatty liver 03/31/2017   [place] April 2018  . GERD (gastroesophageal reflux disease)   . Gout   . Heart attack ([place])   . HTN (hypertension)   . Hypertension    CONTROLLED ON MEDS  . IBS (irritable bowel syndrome)   . Kidney stone   . LVH (left ventricular  hypertrophy) 11/24/2018   Moderate, ECHO  . Myocardial infarction ([place]) 08/23/2018  . Seasonal allergies   . Spinal stenosis     Past Surgical History:  Procedure Laterality Date  . ACDF with fusion  2007  . CARDIAC CATHETERIZATION Left 08/15/2016   Procedure: Left Heart Cath and Coronary Angiography;  Surgeon: Wellington Hampshire, MD;  Location: [facility];  Service: Cardiovascular;  Laterality: Left;  . COLONOSCOPY WITH PROPOFOL N/A 06/26/2015   Procedure: COLONOSCOPY WITH PROPOFOL;  Surgeon: Lucilla Lame, MD;  Location: [facility];  Service: Endoscopy;  Laterality: N/A;  with biopsies  . CORONARY/GRAFT ACUTE MI REVASCULARIZATION N/A 08/23/2018   Procedure: Coronary/Graft Acute MI Revascularization;  Surgeon: Isaias Cowman, MD;  Location: [facility];  Service: Cardiovascular;  Laterality: N/A;  . CYSTOSCOPY W/ RETROGRADES  02/10/2020   Procedure: CYSTOSCOPY WITH RETROGRADE PYELOGRAM;  Surgeon: Billey Co, MD;  Location: [facility];  Service: Urology;;  . Consuela Mimes WITH BIOPSY N/A 11/22/2019   Procedure: CYSTOSCOPY WITH Bladder BIOPSY & Almyra Free;  Surgeon: Billey Co, MD;  Location: [facility];  Service: Urology;  Laterality: N/A;  . CYSTOSCOPY/URETEROSCOPY/HOLMIUM LASER  02/10/2020   Procedure: CYSTOSCOPY/URETEROSCOPY/HOLMIUM LASER;  Surgeon: Billey Co, MD;  Location: [facility];  Service: Urology;;  . CYSTOSCOPY/URETEROSCOPY/HOLMIUM LASER/STENT PLACEMENT Left 11/22/2019   Procedure: CYSTOSCOPY/URETEROSCOPY/BILATERAL RETROGRADE PYELOGRAM/HOLMIUM LASER/STENT PLACEMENT;  Surgeon: Billey Co, MD;  Location: [facility];  Service: Urology;  Laterality: Left;  . LEFT HEART CATH AND CORONARY ANGIOGRAPHY  N/A 08/23/2018   Procedure: LEFT HEART CATH AND CORONARY ANGIOGRAPHY;  Surgeon: Isaias Cowman, MD;  Location: [facility];  Service: Cardiovascular;  Laterality: N/A;  . LEFT HEART CATH AND CORONARY ANGIOGRAPHY N/A 02/10/2020    Procedure: LEFT HEART CATH AND CORONARY ANGIOGRAPHY;  Surgeon: Corey Skains, MD;  Location: [facility];  Service: Cardiovascular;  Laterality: N/A;  . NASAL SEPTUM SURGERY  2004  . SHOULDER ARTHROSCOPY W/ ROTATOR CUFF REPAIR  10/01/2019  . SHOULDER SURGERY Left 2007  . Testicular torsion  1980s  . VASECTOMY      Home Medications:    Allergies:  Allergies  Allergen Reactions  . Imdur [Isosorbide Nitrate]   . Lisinopril Swelling  . Other Hives    adhesives  . Drug [Tape] Itching    Rash, blisters  . Tapentadol Itching    Rash, blisters  . Ace Inhibitors Swelling    Current Facility-Administered Medications (Endocrine & Metabolic):  .  insulin aspart (novoLOG) injection 0-15 Units .  insulin aspart (novoLOG) injection 0-5 Units   Current Facility-Administered Medications (Cardiovascular):  .  amLODipine (NORVASC) tablet 10 mg .  atorvastatin (LIPITOR) tablet 80 mg .  ezetimibe (ZETIA) tablet 10 mg .  hydrALAZINE (APRESOLINE) injection 10 mg .  hydrALAZINE (APRESOLINE) tablet 25 mg .  metoprolol tartrate (LOPRESSOR) tablet 25 mg .  nitroGLYCERIN (NITROSTAT) SL tablet 0.4 mg   Current Facility-Administered Medications (Respiratory):  .  diphenhydrAMINE (BENADRYL) capsule 50 mg .  loratadine (CLARITIN) tablet 10 mg .  promethazine (PHENERGAN) injection 25 mg   Current Facility-Administered Medications (Analgesics):  .  acetaminophen (TYLENOL) tablet 650 mg .  allopurinol (ZYLOPRIM) tablet 100 mg .  aspirin EC tablet 81 mg .  HYDROcodone-acetaminophen (NORCO/VICODIN) 5-325 MG per tablet 1-2 tablet .  ibuprofen (ADVIL) tablet 800 mg   Current Facility-Administered Medications (Hematological):  .  vitamin B-12 (CYANOCOBALAMIN) tablet 1,000 mcg   Current Facility-Administered Medications (Other):  .  famotidine (PEPCID) tablet 10 mg .  methocarbamol (ROBAXIN) tablet 500 mg .  Muscle Rub CREA 1 application .  ondansetron (ZOFRAN) injection 4 mg .   ondansetron (ZOFRAN) tablet 4 mg .  ondansetron (ZOFRAN-ODT) disintegrating tablet 4 mg .  ondansetron (ZOFRAN-ODT) disintegrating tablet 4 mg .  pregabalin (LYRICA) capsule 50 mg .  tamsulosin (FLOMAX) capsule 0.4 mg  No current outpatient medications on file.    Family History  Problem Relation Age of Onset  . Breast cancer Mother   . Skin cancer Mother   . Arrhythmia Mother        A-fib  . Cancer Mother        breast, colon?, skin  . Hypertension Father   . Diabetes Father   . Cancer Maternal Grandfather   . Heart disease Brother        stent  . Allergies Son   . Alzheimer's disease Maternal Grandmother   . Stroke Neg Hx   . COPD Neg Hx     Social History:  reports that he quit smoking about 17 years ago. His smoking use included cigarettes. He has a 10.00 pack-year smoking history. He has never used smokeless tobacco. He reports that he does not drink alcohol and does not use drugs.  ROS: A complete review of systems was performed.  All systems are negative except for pertinent findings as noted.  Physical Exam:  Vital signs in last 24 hours: Temp:  [96.5 F (35.8 C)-98.6 F (37 C)] 98.4 F (36.9 C) (11/05  0450) Pulse Rate:  [88-161] 90 (11/05 0450) Resp:  [16-46] 18 (11/05 0450) BP: (115-198)/(84-140) 174/124 (11/05 0809) SpO2:  [91 %-100 %] 99 % (11/05 0809) Weight:  [90.1 kg-94.5 kg] 90.1 kg (11/05 0611) Constitutional:  Alert and oriented, No acute distress HEENT: [place] AT, dry mucus membranes.  Trachea midline Cardiovascular: No clubbing, cyanosis, or edema. Respiratory: Normal respiratory effort, lungs clear bilaterally GI: Abdomen is soft, nontender, nondistended, no abdominal masses GU: Left CVA tenderness with left flank ecchymosis Neurologic: Grossly intact, no focal deficits, moving all 4 extremities Psychiatric: Normal mood and affect   Laboratory Data:  Recent Labs    10/29/20 1730  WBC 15.4*  HGB 16.9  HCT 51.1   Recent Labs     10/29/20 1730  NA 137  K 4.6  CL 98  CO2 20*  GLUCOSE 220*  BUN 13  CREATININE 1.18  CALCIUM 9.5   No results for input(s): LABPT, INR in the last 72 hours. No results for input(s): LABURIN in the last 72 hours. Results for orders placed or performed during the hospital encounter of 10/29/20  Respiratory Panel by RT PCR (Flu A&B, Covid) - Nasopharyngeal Swab     Status: None   Collection Time: 10/30/20 12:27 AM   Specimen: Nasopharyngeal Swab  Result Value Ref Range Status   SARS Coronavirus 2 by RT PCR NEGATIVE NEGATIVE Final    Comment: (NOTE) SARS-CoV-2 target nucleic acids are NOT DETECTED.  The SARS-CoV-2 RNA is generally detectable in upper respiratoy specimens during the acute phase of infection. The lowest concentration of SARS-CoV-2 viral copies this assay can detect is 131 copies/mL. A negative result does not preclude SARS-Cov-2 infection and should not be used as the sole basis for treatment or other patient management decisions. A negative result may occur with  improper specimen collection/handling, submission of specimen other than nasopharyngeal swab, presence of viral mutation(s) within the areas targeted by this assay, and inadequate number of viral copies (<131 copies/mL). A negative result must be combined with clinical observations, patient history, and epidemiological information. The expected result is Negative.  Fact Sheet for Patients:  PinkCheek.be  Fact Sheet for Healthcare Providers:  GravelBags.it  This test is no t yet approved or cleared by the [place] FDA and  has been authorized for detection and/or diagnosis of SARS-CoV-2 by FDA under an Emergency Use Authorization (EUA). This EUA will remain  in effect (meaning this test can be used) for the duration of the COVID-19 declaration under Section 564(b)(1) of the Act, 21 U.S.C. section 360bbb-3(b)(1), unless the authorization is  terminated or revoked sooner.     Influenza A by PCR NEGATIVE NEGATIVE Final   Influenza B by PCR NEGATIVE NEGATIVE Final    Comment: (NOTE) The Xpert Xpress SARS-CoV-2/FLU/RSV assay is intended as an aid in  the diagnosis of influenza from Nasopharyngeal swab specimens and  should not be used as a sole basis for treatment. Nasal washings and  aspirates are unacceptable for Xpert Xpress SARS-CoV-2/FLU/RSV  testing.  Fact Sheet for Patients: PinkCheek.be  Fact Sheet for Healthcare Providers: GravelBags.it  This test is not yet approved or cleared by the [place] FDA and  has been authorized for detection and/or diagnosis of SARS-CoV-2 by  FDA under an Emergency Use Authorization (EUA). This EUA will remain  in effect (meaning this test can be used) for the duration of the  Covid-19 declaration under Section 564(b)(1) of the Act, 21  U.S.C. section 360bbb-3(b)(1), unless the authorization is  terminated or revoked. Performed at [facility], [address], [place],  [place] [address]      Radiologic Imaging: CT ABDOMEN PELVIS WO CONTRAST  Result Date: 10/29/2020 CLINICAL DATA:  Abdominal pain with nausea and vomiting. Lithotripsy today. EXAM: CT ABDOMEN AND PELVIS WITHOUT CONTRAST TECHNIQUE: Multidetector CT imaging of the abdomen and pelvis was performed following the standard protocol without IV contrast. COMPARISON:  Most recent CT 02/07/2020 FINDINGS: Lower chest: No pleural fluid or focal airspace disease. Coronary artery calcifications again seen. Hepatobiliary: Hepatic steatosis. No evidence of focal lesion on noncontrast exam. Gallbladder physiologically distended, no calcified stone. No biliary dilatation. Pancreas: No ductal dilatation or inflammation. Spleen: Normal in size without focal abnormality. Adrenals/Urinary Tract: No adrenal nodule. Hyperdense subcapsular collection in the mid and inferior  left kidney measures approximately 2.6 x 6.3 x 7.4 cm and is typical of subcapsular hematoma. This causes mild mass effect on underlying renal parenchyma. Nonobstructing stones in the mid lower left kidney, largest stone or stone fragments in the lower pole span 19 mm, series 5, image 58. Moderate left perinephric stranding. There is no hydronephrosis. No ureteral calculi. Previous cyst in the left kidney is grossly stable from prior. No definite right renal calculi. No right hydronephrosis. There is mild right perinephric edema. No right ureteral stone. Unremarkable urinary bladder.  There is no bladder stone. Stomach/Bowel: Stomach partially distended. There is no bowel obstruction or inflammation. Air-filled appendix without appendicitis. Small to moderate stool burden in the colon. Left colonic diverticulosis without diverticulitis. No colonic inflammation. Vascular/Lymphatic: Moderate aortic atherosclerosis. Mild branch atherosclerosis. No aortic aneurysm. No abdominopelvic adenopathy. Reproductive: Enlarged prostate gland spans 5.2 cm transverse. Other: No ascites or free air. Musculoskeletal: Multilevel degenerative change throughout the spine. There are no acute or suspicious osseous abnormalities. IMPRESSION: 1. Post recent lithotripsy with left renal subcapsular hematoma measuring 2.6 x 6.3 x 7.4 cm and perinephric stranding. Hematoma causes mild mass effect on underlying renal parenchyma. Recommend correlation for hypertension in the setting ([facility]). 2. Nonobstructing stones in the mid lower left kidney, largest stone or stone fragments in the lower pole span 19 mm. No hydronephrosis. No ureteral stone or calculi. 3. Colonic diverticulosis without diverticulitis. 4. Enlarged prostate gland. 5. Hepatic steatosis. Aortic Atherosclerosis (ICD10-I70.0). Electronically Signed   By: Keith Rake M.D.   On: 10/29/2020 19:44   DG Abd 1 View  Result Date: 10/29/2020 CLINICAL DATA:  Left renal stone  EXAM: ABDOMEN - 1 VIEW COMPARISON:  10/13/2020 FINDINGS: A 6 mm calculus of the lower pole the left kidney is again identified. No additional calculi are identified. Bowel gas pattern is unremarkable. IMPRESSION: Unchanged 6 mm left renal calculus. Electronically Signed   By: Macy Mis M.D.   On: 10/29/2020 08:05   CT Angio Chest PE W and/or Wo Contrast  Result Date: 10/29/2020 CLINICAL DATA:  Tachycardia EXAM: CT ANGIOGRAPHY CHEST WITH CONTRAST TECHNIQUE: Multidetector CT imaging of the chest was performed using the standard protocol during bolus administration of intravenous contrast. Multiplanar CT image reconstructions and MIPs were obtained to evaluate the vascular anatomy. CONTRAST:  68mL OMNIPAQUE IOHEXOL 350 MG/ML SOLN COMPARISON:  None. FINDINGS: Cardiovascular: There is slightly suboptimal opacification of the main pulmonary artery, however no central or proximal segmental pulmonary embolism. The heart is normal in size. No pericardial effusion or thickening. No evidence right heart strain. There is normal three-vessel brachiocephalic anatomy without proximal stenosis. Scattered aortic atherosclerosis is noted. Coronary artery calcifications are seen. Mediastinum/Nodes: No hilar, mediastinal, or axillary  adenopathy. Thyroid gland, trachea, and esophagus demonstrate no significant findings. Lungs/Pleura: Streaky airspace opacity is seen at the right lung base. No pleural effusion or pneumothorax. No airspace consolidation. Upper Abdomen: No acute abnormalities present in the visualized portions of the upper abdomen. Musculoskeletal: No chest wall abnormality. No acute or significant osseous findings. Review of the MIP images confirms the above findings. IMPRESSION: Slightly suboptimal opacification of the main pulmonary artery however no central or proximal segmental pulmonary embolism. Streaky atelectasis at both lung bases. No other acute intrathoracic pathology to explain the patient's symptoms.  Electronically Signed   By: Prudencio Pair M.D.   On: 10/29/2020 22:31   DG Chest Port 1 View  Result Date: 10/30/2020 CLINICAL DATA:  Tachycardia EXAM: PORTABLE CHEST 1 VIEW COMPARISON:  06/17/2020 FINDINGS: Heart and mediastinal contours are within normal limits. No focal opacities or effusions. No acute bony abnormality. IMPRESSION: No active disease. Electronically Signed   By: Rolm Baptise M.D.   On: 10/30/2020 03:30    Impression/Assessment:  49 year old male with a significant cardiac history and recurrent nephrolithiasis who underwent ESWL on October 29, 2020 for a 6 mm left lower pole stone who presented to the ED last evening with nausea and vomiting, bloody urination and pain in the left flank.  Noncontrast CT performed in the ED noted a 7.5 cm left renal hematoma.  He was also found to have elevated troponins and has been admitted for possible N ST EMI.  Plan:  -Continue to trend hematocrit and consider repeat renal ultrasound if hematocrit starts a downward trend -Recommend avoiding anticoagulation/NSAIDs at this time  -Continue cardiac monitoring  -No plans for urological intervention at this time -Continue bedrest, pain control and supportive care    10/30/2020, 8:11 AM   Thank you for involving me in this patient's care, urology will continue to follow along.  Mayleen Borrero

## 2020-10-30 NOTE — Progress Notes (Signed)
   10/30/20 0450  Assess: MEWS Score  Temp 98.4 F (36.9 C)  BP 115/84  Pulse Rate 90  Resp 18  Level of Consciousness Alert  SpO2 96 %  O2 Device Room Air  Assess: MEWS Score  MEWS Temp 0  MEWS Systolic 0  MEWS Pulse 0  MEWS RR 0  MEWS LOC 0  MEWS Score 0  MEWS Score Color Green  Assess: if the MEWS score is Yellow or Red  Were vital signs taken at a resting state? Yes  Focused Assessment Change from prior assessment (see assessment flowsheet)  Early Detection of Sepsis Score *See Row Information* Low  MEWS guidelines implemented *See Row Information* No, vital signs rechecked  Notify: Provider  Provider Name/Title Sharion Settler, NP  Date Provider Notified 10/30/20  Time Provider Notified [phone]  Notification Type [facility]  Notification Reason Other (Comment) (pt condition improved)

## 2020-10-30 NOTE — Consult Note (Signed)
[facility] Cardiology  CARDIOLOGY CONSULT NOTE  Patient ID: John Hartman MRN: [id number] DOB/AGE: [birth year] 47 y.o.  Admit date: 10/29/2020 Referring Physician Maylene Roes Primary Physician [facility] Primary Cardiologist Asencion Loveday Reason for Consultation elevated troponin due to myocardial injury  HPI: 49 year old gentleman referred for evaluation of elevated troponin.  Patient underwent extracorporeal shockwave lithotripsy for left kidney stone on 10/29/2020 with post procedural nausea and vomiting.  Was tachycardic, ECG revealed sinus tachycardia at a rate of 152 without new ischemic ST-T wave changes.  CT did not reveal evidence for pulmonary embolus.  High-sensitivity troponin was elevated to 637, 672, 686.  Patient denies chest pain.  Patient has a history of inferior STEMI 08/23/2018, status post primary PCI with DES mid left circumflex.  Patient presented with right flank pain 02/07/2020 at which time high-sensitivity troponin was elevated to 10, 1460, 9920, 14,856, 17,310, and 7797.  The patient underwent cardiac catheterization which revealed widely patent stent left circumflex without significant coronary stenosis.  Review of systems complete and found to be negative unless listed above     Past Medical History:  Diagnosis Date  . Angioedema   . Bronchitis   . DDD (degenerative disc disease), cervical   . Diabetes mellitus ([place])   . Fatty liver 03/31/2017   [place] April 2018  . GERD (gastroesophageal reflux disease)   . Gout   . Heart attack ([place])   . HTN (hypertension)   . Hypertension    CONTROLLED ON MEDS  . IBS (irritable bowel syndrome)   . Kidney stone   . LVH (left ventricular hypertrophy) 11/24/2018   Moderate, ECHO  . Myocardial infarction ([place]) 08/23/2018  . Seasonal allergies   . Spinal stenosis     Past Surgical History:  Procedure Laterality Date  . ACDF with fusion  2007  . CARDIAC CATHETERIZATION Left 08/15/2016   Procedure: Left Heart Cath and Coronary Angiography;  Surgeon:  Wellington Hampshire, MD;  Location: [place] [facility];  Service: Cardiovascular;  Laterality: Left;  . COLONOSCOPY WITH PROPOFOL N/A 06/26/2015   Procedure: COLONOSCOPY WITH PROPOFOL;  Surgeon: Lucilla Lame, MD;  Location: [place];  Service: Endoscopy;  Laterality: N/A;  with biopsies  . CORONARY/GRAFT ACUTE MI REVASCULARIZATION N/A 08/23/2018   Procedure: Coronary/Graft Acute MI Revascularization;  Surgeon: Isaias Cowman, MD;  Location: [place] [facility];  Service: Cardiovascular;  Laterality: N/A;  . CYSTOSCOPY W/ RETROGRADES  02/10/2020   Procedure: CYSTOSCOPY WITH RETROGRADE PYELOGRAM;  Surgeon: Billey Co, MD;  Location: [facility];  Service: Urology;;  . Consuela Mimes WITH BIOPSY N/A 11/22/2019   Procedure: CYSTOSCOPY WITH Bladder BIOPSY & Almyra Free;  Surgeon: Billey Co, MD;  Location: [facility];  Service: Urology;  Laterality: N/A;  . CYSTOSCOPY/URETEROSCOPY/HOLMIUM LASER  02/10/2020   Procedure: CYSTOSCOPY/URETEROSCOPY/HOLMIUM LASER;  Surgeon: Billey Co, MD;  Location: [facility];  Service: Urology;;  . CYSTOSCOPY/URETEROSCOPY/HOLMIUM LASER/STENT PLACEMENT Left 11/22/2019   Procedure: CYSTOSCOPY/URETEROSCOPY/BILATERAL RETROGRADE PYELOGRAM/HOLMIUM LASER/STENT PLACEMENT;  Surgeon: Billey Co, MD;  Location: [facility];  Service: Urology;  Laterality: Left;  . LEFT HEART CATH AND CORONARY ANGIOGRAPHY N/A 08/23/2018   Procedure: LEFT HEART CATH AND CORONARY ANGIOGRAPHY;  Surgeon: Isaias Cowman, MD;  Location: [place] [facility];  Service: Cardiovascular;  Laterality: N/A;  . LEFT HEART CATH AND CORONARY ANGIOGRAPHY N/A 02/10/2020   Procedure: LEFT HEART CATH AND CORONARY ANGIOGRAPHY;  Surgeon: Corey Skains, MD;  Location: [place] [facility];  Service: Cardiovascular;  Laterality: N/A;  . NASAL SEPTUM SURGERY  2004  .  SHOULDER ARTHROSCOPY W/ ROTATOR CUFF REPAIR  10/01/2019  . SHOULDER SURGERY Left 2007  . Testicular torsion  1980s  . VASECTOMY       Medications Prior to Admission  Medication Sig Dispense Refill Last Dose  . allopurinol (ZYLOPRIM) 100 MG tablet Take 100 mg by mouth daily.    10/28/2020 at Unknown time  . amLODipine (NORVASC) 10 MG tablet Take 1 tablet (10 mg total) by mouth daily. 30 tablet 1 10/28/2020 at Unknown time  . aspirin EC 81 MG tablet Take 81 mg by mouth daily.   Past Week at Unknown time  . atorvastatin (LIPITOR) 80 MG tablet Take 1 tablet (80 mg total) by mouth daily at 6 PM. (Patient taking differently: Take 80 mg by mouth daily with supper. ) 30 tablet 0 10/28/2020 at Unknown time  . cetirizine (ZYRTEC) 10 MG tablet Take 10 mg by mouth at bedtime.   10/28/2020 at Unknown time  . Dapagliflozin-metFORMIN HCl ER (XIGDUO XR) 04-999 MG TB24 Take 1 tablet by mouth in the morning and at bedtime.    10/28/2020 at Unknown time  . diphenhydrAMINE (BENADRYL) 25 MG tablet Take 50 mg by mouth at bedtime.   10/28/2020 at Unknown time  . ezetimibe (ZETIA) 10 MG tablet Take 1 tablet (10 mg total) by mouth daily. 30 tablet 11 10/28/2020 at Unknown time  . famotidine (PEPCID) 10 MG tablet Take 10 mg by mouth daily.   10/28/2020 at Unknown time  . hydrALAZINE (APRESOLINE) 25 MG tablet Take 1 tablet (25 mg total) by mouth 2 (two) times daily. 60 tablet 0 10/28/2020 at Unknown time  . HYDROcodone-acetaminophen (NORCO/VICODIN) 5-325 MG tablet Take 1-2 tablets by mouth every 6 (six) hours as needed for moderate pain. 10 tablet 0 prn at prn  . ibuprofen (ADVIL) 800 MG tablet Take 1 tablet (800 mg total) by mouth every 8 (eight) hours as needed. 20 tablet 0 prn at prn  . Menthol, Topical Analgesic, (BIOFREEZE EX) Apply 1 application topically 3 (three) times daily as needed (muscle cramps/shoulder pain.).   prn at prn  . metoprolol tartrate (LOPRESSOR) 25 MG tablet Take 25 mg by mouth 2 (two) times daily.   Unknown at Unknown  . nitroGLYCERIN (NITROSTAT) 0.4 MG SL tablet Place 1 tablet (0.4 mg total) under the tongue every 5 (five) minutes as  needed for chest pain. 30 tablet 0 prn at prn  . ondansetron (ZOFRAN ODT) 4 MG disintegrating tablet Take 1 tablet (4 mg total) by mouth every 8 (eight) hours as needed for nausea or vomiting. 20 tablet 0 prn at prn  . ondansetron (ZOFRAN) 4 MG tablet Take 1 tablet (4 mg total) by mouth every 8 (eight) hours as needed for nausea or vomiting. 20 tablet 0 prn at prn  . pregabalin (LYRICA) 50 MG capsule Take 1 capsule (50 mg total) by mouth 3 (three) times daily. (Patient taking differently: Take 50 mg by mouth 2 (two) times daily. 1 capsule in the morning and 2 capsules at night.) 90 capsule 5 Unknown at Unknown  . RYBELSUS 3 MG TABS Take 3 mg by mouth daily at 6 (six) AM.    Unknown at Unknown  . tamsulosin (FLOMAX) 0.4 MG CAPS capsule Take 1 capsule (0.4 mg total) by mouth daily. 30 capsule 0 Unknown at Unknown  . vitamin B-12 (CYANOCOBALAMIN) 1000 MCG tablet Take 1,000 mcg by mouth daily.    Unknown at Unknown  . methocarbamol (ROBAXIN) 500 MG tablet Take 1 tablet (500 mg total)  by mouth every 8 (eight) hours as needed for muscle spasms. (Patient not taking: Reported on 10/29/2020) 90 tablet 0 Not Taking at Unknown time  . ONETOUCH ULTRA test strip SMARTSIG:Via Meter      Social History   Socioeconomic History  . Marital status: Married    Spouse name: Not on file  . Number of children: 1  . Years of education: Not on file  . Highest education level: Not on file  Occupational History  . Occupation: IT    Comment: supports the Dentist   Tobacco Use  . Smoking status: Former Smoker    Packs/day: 1.00    Years: 10.00    Pack years: 10.00    Types: Cigarettes    Quit date: 04/15/2003    Years since quitting: 17.5  . Smokeless tobacco: Never Used  Vaping Use  . Vaping Use: Never used  Substance and Sexual Activity  . Alcohol use: No    Alcohol/week: 0.0 standard drinks  . Drug use: No  . Sexual activity: Yes  Other Topics Concern  . Not on file  Social History Narrative  .  Not on file   Social Determinants of Health   Financial Resource Strain:   . Difficulty of Paying Living Expenses: Not on file  Food Insecurity:   . Worried About Charity fundraiser in the Last Year: Not on file  . Ran Out of Food in the Last Year: Not on file  Transportation Needs:   . Lack of Transportation (Medical): Not on file  . Lack of Transportation (Non-Medical): Not on file  Physical Activity:   . Days of Exercise per Week: Not on file  . Minutes of Exercise per Session: Not on file  Stress:   . Feeling of Stress : Not on file  Social Connections:   . Frequency of Communication with Friends and Family: Not on file  . Frequency of Social Gatherings with Friends and Family: Not on file  . Attends Religious Services: Not on file  . Active Member of Clubs or Organizations: Not on file  . Attends Archivist Meetings: Not on file  . Marital Status: Not on file  Intimate Partner Violence:   . Fear of Current or Ex-Partner: Not on file  . Emotionally Abused: Not on file  . Physically Abused: Not on file  . Sexually Abused: Not on file    Family History  Problem Relation Age of Onset  . Breast cancer Mother   . Skin cancer Mother   . Arrhythmia Mother        A-fib  . Cancer Mother        breast, colon?, skin  . Hypertension Father   . Diabetes Father   . Cancer Maternal Grandfather   . Heart disease Brother        stent  . Allergies Son   . Alzheimer's disease Maternal Grandmother   . Stroke Neg Hx   . COPD Neg Hx       Review of systems complete and found to be negative unless listed above      PHYSICAL EXAM  General: Well developed, well nourished, in no acute distress HEENT:  Normocephalic and atramatic Neck:  No JVD.  Lungs: Clear bilaterally to auscultation and percussion. Heart: HRRR . Normal S1 and S2 without gallops or murmurs.  Abdomen: Bowel sounds are positive, abdomen soft and non-tender  Msk:  Back normal, normal gait. Normal  strength and tone for  age. Extremities: No clubbing, cyanosis or edema.   Neuro: Alert and oriented X 3. Psych:  Good affect, responds appropriately  Labs:   Lab Results  Component Value Date   WBC 15.4 (H) 10/29/2020   HGB 16.9 10/29/2020   HCT 51.1 10/29/2020   MCV 80.5 10/29/2020   PLT 254 10/29/2020    Recent Labs  Lab 10/29/20 1730  NA 137  K 4.6  CL 98  CO2 20*  BUN 13  CREATININE 1.18  CALCIUM 9.5  PROT 9.0*  BILITOT 3.0*  ALKPHOS 105  ALT 77*  AST 70*  GLUCOSE 220*   Lab Results  Component Value Date   TROPONINI <0.03 03/26/2019    Lab Results  Component Value Date   CHOL 116 10/30/2020   CHOL 145 02/08/2020   CHOL 135 01/06/2020   Lab Results  Component Value Date   HDL 42 10/30/2020   HDL 33 (L) 02/08/2020   HDL 33 (L) 01/06/2020   Lab Results  Component Value Date   LDLCALC 60 10/30/2020   LDLCALC 95 02/08/2020   LDLCALC 78 01/06/2020   Lab Results  Component Value Date   TRIG 68 10/30/2020   TRIG 84 02/08/2020   TRIG 140 01/06/2020   Lab Results  Component Value Date   CHOLHDL 2.8 10/30/2020   CHOLHDL 4.4 02/08/2020   CHOLHDL 4.1 01/06/2020   No results found for: LDLDIRECT    Radiology: CT ABDOMEN PELVIS WO CONTRAST  Result Date: 10/29/2020 CLINICAL DATA:  Abdominal pain with nausea and vomiting. Lithotripsy today. EXAM: CT ABDOMEN AND PELVIS WITHOUT CONTRAST TECHNIQUE: Multidetector CT imaging of the abdomen and pelvis was performed following the standard protocol without IV contrast. COMPARISON:  Most recent CT 02/07/2020 FINDINGS: Lower chest: No pleural fluid or focal airspace disease. Coronary artery calcifications again seen. Hepatobiliary: Hepatic steatosis. No evidence of focal lesion on noncontrast exam. Gallbladder physiologically distended, no calcified stone. No biliary dilatation. Pancreas: No ductal dilatation or inflammation. Spleen: Normal in size without focal abnormality. Adrenals/Urinary Tract: No adrenal nodule.  Hyperdense subcapsular collection in the mid and inferior left kidney measures approximately 2.6 x 6.3 x 7.4 cm and is typical of subcapsular hematoma. This causes mild mass effect on underlying renal parenchyma. Nonobstructing stones in the mid lower left kidney, largest stone or stone fragments in the lower pole span 19 mm, series 5, image 58. Moderate left perinephric stranding. There is no hydronephrosis. No ureteral calculi. Previous cyst in the left kidney is grossly stable from prior. No definite right renal calculi. No right hydronephrosis. There is mild right perinephric edema. No right ureteral stone. Unremarkable urinary bladder.  There is no bladder stone. Stomach/Bowel: Stomach partially distended. There is no bowel obstruction or inflammation. Air-filled appendix without appendicitis. Small to moderate stool burden in the colon. Left colonic diverticulosis without diverticulitis. No colonic inflammation. Vascular/Lymphatic: Moderate aortic atherosclerosis. Mild branch atherosclerosis. No aortic aneurysm. No abdominopelvic adenopathy. Reproductive: Enlarged prostate gland spans 5.2 cm transverse. Other: No ascites or free air. Musculoskeletal: Multilevel degenerative change throughout the spine. There are no acute or suspicious osseous abnormalities. IMPRESSION: 1. Post recent lithotripsy with left renal subcapsular hematoma measuring 2.6 x 6.3 x 7.4 cm and perinephric stranding. Hematoma causes mild mass effect on underlying renal parenchyma. Recommend correlation for hypertension in the setting (Page kidney). 2. Nonobstructing stones in the mid lower left kidney, largest stone or stone fragments in the lower pole span 19 mm. No hydronephrosis. No ureteral stone or calculi. 3. Colonic diverticulosis  without diverticulitis. 4. Enlarged prostate gland. 5. Hepatic steatosis. Aortic Atherosclerosis (ICD10-I70.0). Electronically Signed   By: Keith Rake M.D.   On: 10/29/2020 19:44   DG Abd 1  View  Result Date: 10/29/2020 CLINICAL DATA:  Left renal stone EXAM: ABDOMEN - 1 VIEW COMPARISON:  10/13/2020 FINDINGS: A 6 mm calculus of the lower pole the left kidney is again identified. No additional calculi are identified. Bowel gas pattern is unremarkable. IMPRESSION: Unchanged 6 mm left renal calculus. Electronically Signed   By: Macy Mis M.D.   On: 10/29/2020 08:05   Abdomen 1 view (KUB)  Result Date: 10/14/2020 CLINICAL DATA:  Follow-up right kidney stone. EXAM: ABDOMEN - 1 VIEW COMPARISON:  Abdomen radiograph and abdomen and pelvis CT dated 02/07/2020 FINDINGS: Normal bowel gas pattern. Stable 7 mm lower pole left renal calculus. No radiographically visible right renal, ureteral or bladder calculi. Lumbar spine degenerative changes, most pronounced at the L1-2 level. IMPRESSION: Stable 7 mm lower pole left renal calculus. No additional calculi visualized. Electronically Signed   By: Claudie Revering M.D.   On: 10/14/2020 15:00   CT Angio Chest PE W and/or Wo Contrast  Result Date: 10/29/2020 CLINICAL DATA:  Tachycardia EXAM: CT ANGIOGRAPHY CHEST WITH CONTRAST TECHNIQUE: Multidetector CT imaging of the chest was performed using the standard protocol during bolus administration of intravenous contrast. Multiplanar CT image reconstructions and MIPs were obtained to evaluate the vascular anatomy. CONTRAST:  70mL OMNIPAQUE IOHEXOL 350 MG/ML SOLN COMPARISON:  None. FINDINGS: Cardiovascular: There is slightly suboptimal opacification of the main pulmonary artery, however no central or proximal segmental pulmonary embolism. The heart is normal in size. No pericardial effusion or thickening. No evidence right heart strain. There is normal three-vessel brachiocephalic anatomy without proximal stenosis. Scattered aortic atherosclerosis is noted. Coronary artery calcifications are seen. Mediastinum/Nodes: No hilar, mediastinal, or axillary adenopathy. Thyroid gland, trachea, and esophagus demonstrate no  significant findings. Lungs/Pleura: Streaky airspace opacity is seen at the right lung base. No pleural effusion or pneumothorax. No airspace consolidation. Upper Abdomen: No acute abnormalities present in the visualized portions of the upper abdomen. Musculoskeletal: No chest wall abnormality. No acute or significant osseous findings. Review of the MIP images confirms the above findings. IMPRESSION: Slightly suboptimal opacification of the main pulmonary artery however no central or proximal segmental pulmonary embolism. Streaky atelectasis at both lung bases. No other acute intrathoracic pathology to explain the patient's symptoms. Electronically Signed   By: Prudencio Pair M.D.   On: 10/29/2020 22:31   DG Chest Port 1 View  Result Date: 10/30/2020 CLINICAL DATA:  Tachycardia EXAM: PORTABLE CHEST 1 VIEW COMPARISON:  06/17/2020 FINDINGS: Heart and mediastinal contours are within normal limits. No focal opacities or effusions. No acute bony abnormality. IMPRESSION: No active disease. Electronically Signed   By: Rolm Baptise M.D.   On: 10/30/2020 03:30    EKG: Sinus tachycardia with evidence of old inferior MI  ASSESSMENT AND PLAN:   1.  Elevated troponin (637, 672, 686) due to myocardial injury ( ICD10  I5A ) in the setting of renal colic, nausea and vomiting, left perinephric hematoma, unlikely due to non-ST elevation myocardial infarction in the absence of chest pain or new ischemic ECG changes.  Patient had very similar presentation 02/07/2020 with much higher levels of high sensitivity troponin, which time cardiac catheterization revealed patent stent mid left circumflex without significant coronary stenosis. 2.  Coronary artery disease, status post inferior STEMI 08/23/2018, status post DES mid left circumflex, with patent stent by  recent cardiac catheterization 02/10/2020 3.  Nausea and vomiting, status post ESWL, complicated by left perinephric hematoma 4.  Mild cardiomyopathy, LVEF 40 to 45% by  echocardiogram 02/08/2020  Recommendations  1.  Agree with current overall therapy 2.  Defer full dose anticoagulation 3.  Defer cardiac catheterization 4.  Review 2D echocardiogram 5.  Continue metoprolol tartrate, amlodipine, and hydralazine for blood pressure control 6.  Further recommendations pending patient's clinical course, will continue to monitor closely  Signed: Isaias Cowman MD,PhD, [facility] 10/30/2020, 9:11 AM

## 2020-10-30 NOTE — Progress Notes (Signed)
   10/30/20 1315  Clinical Encounter Type  Visited With Patient and family together  Visit Type Follow-up;Spiritual support  Referral From Chaplain  Consult/Referral To Chaplain  While in ICU, chaplain heard that Pt was being transferred to ICU and she checked in on Pt and his wife. Before leaving chaplain prayed for Pt and wife. Chaplain notice tears streaming down Pt's wife's eyes and she walked over to the window and gave her a hug. Chaplain will follow up with Pt when she is back on duty.

## 2020-10-30 NOTE — Progress Notes (Signed)
*  PRELIMINARY RESULTS* Echocardiogram 2D Echocardiogram has been performed.  John Hartman 10/30/2020, 9:35 AM

## 2020-10-30 NOTE — Progress Notes (Signed)
   10/30/20 1210  Clinical Encounter Type  Visited With Patient;Family  Visit Type Initial  Referral From Nurse  Consult/Referral To Chaplain  Chaplain responded to PG. When she arrived at the room, she saw Pt's wife sitting by the window and remember visiting with them in the past. Pt was trying to rest, therefore, chaplain spoke with his wife to find out what was going. She explained what brought her husband back to the hospital. Staff came in to work with Pt and chaplain told them she would be back to check on them.

## 2020-10-30 NOTE — Plan of Care (Signed)

## 2020-10-30 NOTE — Progress Notes (Signed)
   10/30/20 0200  Assess: MEWS Score  BP (!) 180/130  Pulse Rate (!) 133  Resp 18  SpO2 94 %  O2 Device Room Air  Assess: MEWS Score  MEWS Temp 0  MEWS Systolic 0  MEWS Pulse 3  MEWS RR 0  MEWS LOC 0  MEWS Score 3  MEWS Score Color Yellow  Assess: if the MEWS score is Yellow or Red  Were vital signs taken at a resting state? Yes  Focused Assessment No change from prior assessment  Early Detection of Sepsis Score *See Row Information* Low  MEWS guidelines implemented *See Row Information* No, previously yellow, continue vital signs every 4 hours  Treat  MEWS Interventions Administered scheduled meds/treatments;Escalated (See documentation below)  Pain Scale 0-10  Pain Score 3  Pain Type Surgical pain  Pain Location Flank  Pain Orientation Left  Patients Stated Pain Goal 0  Pain Intervention(s) Distraction;Emotional support  Escalate  MEWS: Escalate Yellow: discuss with charge nurse/RN and consider discussing with provider and RRT  Notify: Charge Nurse/RN  Name of Charge Nurse/RN Notified Moshe Salisbury, RN  Date Charge Nurse/RN Notified 10/30/20  Time Charge Nurse/RN Notified 0210  Notify: Provider  Provider Name/Title Sharion Settler, NP  Date Provider Notified 10/30/20  Time Provider Notified 0210  Notification Type Page  Notification Reason Other (Comment) (Patient arrived on unit in this condition)  Response See new orders  Date of Provider Response 10/30/20  Time of Provider Response 0211  Will continue to monitor patient for improvement.

## 2020-10-30 NOTE — ED Notes (Signed)
Message sent MD.Garba , regarding blood pressure

## 2020-10-30 NOTE — Progress Notes (Signed)
Patient arrived on unit.

## 2020-10-30 NOTE — Progress Notes (Signed)
   10/30/20 0335  Assess: MEWS Score  Temp 98.6 F (37 C)  BP (!) 160/127  Pulse Rate (!) 121  Resp 20  SpO2 99 %  O2 Device Room Air  Assess: MEWS Score  MEWS Temp 0  MEWS Systolic 0  MEWS Pulse 2  MEWS RR 0  MEWS LOC 0  MEWS Score 2  MEWS Score Color Yellow  Treat  Pain Scale 0-10  Pain Score 3  Pain Type Acute pain  Pain Location Flank  Pain Orientation Left  Escalate  MEWS: Escalate Yellow: discuss with charge nurse/RN and consider discussing with provider and RRT  Notify: Charge Nurse/RN  Name of Charge Nurse/RN Notified Moshe Salisbury, RN  Date Charge Nurse/RN Notified 10/30/20  Time Charge Nurse/RN Notified [id number]  Notify: Provider  Provider Name/Title Sharion Settler, NP  Date Provider Notified 10/30/20  Time Provider Notified [phone]  Notification Type Page  Notification Reason Other (Comment) (follow up)  Response See new orders  Date of Provider Response 10/30/20  Time of Provider Response 0402  Document  Patient Outcome Other (Comment) (minimal change)  Continuing to monitor pt and inform provider of condition. See new orders in [facility].

## 2020-10-31 DIAGNOSIS — I214 Non-ST elevation (NSTEMI) myocardial infarction: Secondary | ICD-10-CM | POA: Diagnosis not present

## 2020-10-31 LAB — CBC
HCT: 49.2 % (ref 39.0–52.0)
Hemoglobin: 16.3 g/dL (ref 13.0–17.0)
MCH: 26.4 pg (ref 26.0–34.0)
MCHC: 33.1 g/dL (ref 30.0–36.0)
MCV: 79.7 fL — ABNORMAL LOW (ref 80.0–100.0)
Platelets: 214 10*3/uL (ref 150–400)
RBC: 6.17 MIL/uL — ABNORMAL HIGH (ref 4.22–5.81)
RDW: 13.5 % (ref 11.5–15.5)
WBC: 16.5 10*3/uL — ABNORMAL HIGH (ref 4.0–10.5)
nRBC: 0 % (ref 0.0–0.2)

## 2020-10-31 LAB — GLUCOSE, CAPILLARY
Glucose-Capillary: 154 mg/dL — ABNORMAL HIGH (ref 70–99)
Glucose-Capillary: 161 mg/dL — ABNORMAL HIGH (ref 70–99)
Glucose-Capillary: 161 mg/dL — ABNORMAL HIGH (ref 70–99)
Glucose-Capillary: 165 mg/dL — ABNORMAL HIGH (ref 70–99)
Glucose-Capillary: 167 mg/dL — ABNORMAL HIGH (ref 70–99)
Glucose-Capillary: 179 mg/dL — ABNORMAL HIGH (ref 70–99)
Glucose-Capillary: 179 mg/dL — ABNORMAL HIGH (ref 70–99)
Glucose-Capillary: 186 mg/dL — ABNORMAL HIGH (ref 70–99)
Glucose-Capillary: 188 mg/dL — ABNORMAL HIGH (ref 70–99)
Glucose-Capillary: 189 mg/dL — ABNORMAL HIGH (ref 70–99)
Glucose-Capillary: 193 mg/dL — ABNORMAL HIGH (ref 70–99)
Glucose-Capillary: 226 mg/dL — ABNORMAL HIGH (ref 70–99)
Glucose-Capillary: 230 mg/dL — ABNORMAL HIGH (ref 70–99)

## 2020-10-31 LAB — BASIC METABOLIC PANEL
Anion gap: 11 (ref 5–15)
Anion gap: 14 (ref 5–15)
BUN: 18 mg/dL (ref 6–20)
BUN: 17 mg/dL (ref 6–20)
CO2: 24 mmol/L (ref 22–32)
CO2: 23 mmol/L (ref 22–32)
Calcium: 9.1 mg/dL (ref 8.9–10.3)
Calcium: 9.3 mg/dL (ref 8.9–10.3)
Chloride: 98 mmol/L (ref 98–111)
Chloride: 96 mmol/L — ABNORMAL LOW (ref 98–111)
Creatinine, Ser: 1.3 mg/dL — ABNORMAL HIGH (ref 0.61–1.24)
Creatinine, Ser: 1.21 mg/dL (ref 0.61–1.24)
GFR, Estimated: >60 mL/min (ref >60)
GFR, Estimated: >60 mL/min (ref >60)
Glucose, Bld: 175 mg/dL — ABNORMAL HIGH (ref 70–99)
Glucose, Bld: 180 mg/dL — ABNORMAL HIGH (ref 70–99)
Potassium: 3.5 mmol/L (ref 3.5–5.1)
Potassium: 3.5 mmol/L (ref 3.5–5.1)
Sodium: 133 mmol/L — ABNORMAL LOW (ref 135–145)
Sodium: 133 mmol/L — ABNORMAL LOW (ref 135–145)

## 2020-10-31 LAB — BETA-HYDROXYBUTYRIC ACID: Beta-Hydroxybutyric Acid: 0.36 mmol/L — ABNORMAL HIGH (ref 0.05–0.27)

## 2020-10-31 LAB — LIPASE, BLOOD: Lipase: 45 U/L (ref 11–51)

## 2020-10-31 MED ORDER — POTASSIUM CHLORIDE CRYS ER 20 MEQ PO TBCR: 40.0000 meq | EXTENDED_RELEASE_TABLET | Freq: Once | ORAL | Status: DC

## 2020-10-31 MED ORDER — INSULIN GLARGINE 100 UNIT/ML ~~LOC~~ SOLN
10.0000 [IU] | Freq: Every day | SUBCUTANEOUS | Status: DC
  Filled 2020-10-31: qty 0.1

## 2020-10-31 MED ORDER — INSULIN ASPART 100 UNIT/ML ~~LOC~~ SOLN
0.0000 [IU] | Freq: Three times a day (TID) | SUBCUTANEOUS | Status: DC
  Administered 2020-10-31 – 2020-11-01 (×3): 2 [IU] via SUBCUTANEOUS
  Filled 2020-10-31 (×3): qty 1

## 2020-10-31 MED ORDER — POTASSIUM CHLORIDE 10 MEQ/100ML IV SOLN
10.0000 meq | INTRAVENOUS | Status: AC
  Administered 2020-10-31 (×4): 10 meq via INTRAVENOUS
  Filled 2020-10-31 (×4): qty 100

## 2020-10-31 MED ORDER — INSULIN DETEMIR 100 UNIT/ML ~~LOC~~ SOLN
15.0000 [IU] | Freq: Two times a day (BID) | SUBCUTANEOUS | Status: DC
  Administered 2020-10-31 – 2020-11-01 (×3): 15 [IU] via SUBCUTANEOUS
  Filled 2020-10-31 (×4): qty 0.15

## 2020-10-31 MED ORDER — INSULIN ASPART 100 UNIT/ML ~~LOC~~ SOLN: 1.0000 [IU] | SUBCUTANEOUS | Status: DC

## 2020-10-31 MED ORDER — METOPROLOL TARTRATE 25 MG PO TABS
25.0000 mg | ORAL_TABLET | Freq: Two times a day (BID) | ORAL | Status: DC
  Administered 2020-10-31 (×2): 25 mg via ORAL
  Filled 2020-10-31 (×2): qty 1

## 2020-10-31 MED ORDER — METOPROLOL TARTRATE 5 MG/5ML IV SOLN: 10.0000 mg | Freq: Four times a day (QID) | INTRAVENOUS | Status: DC

## 2020-10-31 MED ORDER — METOPROLOL TARTRATE 5 MG/5ML IV SOLN
10.0000 mg | Freq: Four times a day (QID) | INTRAVENOUS | Status: DC
  Administered 2020-10-31 (×2): 10 mg via INTRAVENOUS
  Filled 2020-10-31 (×2): qty 10

## 2020-10-31 NOTE — Progress Notes (Signed)
  PROGRESS NOTE  Checked in on patient this afternoon. His IV insulin has been turned off. IV cardizem weaned to 10mg /hr. He remains in sinus tachycardia with rate 115-120. He states his nausea and vomiting has resolved. Tolerated full liquid diet and was able to take his morning medications this morning. Family at bedside. All questions answered.    Dessa Phi, DO [facility] 10/31/2020, 3:03 PM  Available via Epic secure chat 7am-7pm After these hours, please refer to coverage provider listed on [email or web address]

## 2020-10-31 NOTE — Progress Notes (Signed)
[facility] Cardiology  SUBJECTIVE: Patient laying in bed, reports feeling better, less nausea and vomiting, denies chest pain or shortness of breath   Vitals:   10/31/20 0600 10/31/20 0630 10/31/20 0800 10/31/20 0900  BP: (!) 158/111 (!) 154/104  (!) 143/110  Pulse: 93 87  (!) 118  Resp: 19 19  13   Temp:   98.3 F (36.8 C)   TempSrc:   Oral   SpO2: 95% 95%  97%  Weight:      Height:         Intake/Output Summary (Last 24 hours) at 10/31/2020 1601 Last data filed at 10/31/2020 0600 Gross per 24 hour  Intake 2830.83 ml  Output 1700 ml  Net 1130.83 ml      PHYSICAL EXAM  General: Well developed, well nourished, in no acute distress HEENT:  Normocephalic and atramatic Neck:  No JVD.  Lungs: Clear bilaterally to auscultation and percussion. Heart: HRRR . Normal S1 and S2 without gallops or murmurs.  Abdomen: Bowel sounds are positive, abdomen soft and non-tender  Msk:  Back normal, normal gait. Normal strength and tone for age. Extremities: No clubbing, cyanosis or edema.   Neuro: Alert and oriented X 3. Psych:  Good affect, responds appropriately   LABS: Basic Metabolic Panel: Recent Labs    10/31/20 0032 10/31/20 0537  NA 133* 133*  K 3.5 3.5  CL 98 96*  CO2 24 23  GLUCOSE 175* 180*  BUN 18 17  CREATININE 1.30* 1.21  CALCIUM 9.1 9.3   Liver Function Tests: Recent Labs    10/29/20 1730 10/30/20 1055  AST 70* 36  ALT 77* 57*  ALKPHOS 105 93  BILITOT 3.0* 3.1*  PROT 9.0* 8.4*  ALBUMIN 5.5* 4.9   Recent Labs    10/29/20 1730 10/31/20 0032  LIPASE 46 45   CBC: Recent Labs    10/30/20 1055 10/31/20 0537  WBC 15.3* 16.5*  HGB 17.3* 16.3  HCT 52.0 49.2  MCV 79.5* 79.7*  PLT 244 214   Cardiac Enzymes: No results for input(s): CKTOTAL, CKMB, CKMBINDEX, TROPONINI in the last 72 hours. BNP: Invalid input(s): POCBNP D-Dimer: No results for input(s): DDIMER in the last 72 hours. Hemoglobin A1C: Recent Labs    10/30/20 0027  HGBA1C 7.4*   Fasting  Lipid Panel: Recent Labs    10/30/20 0027  CHOL 116  HDL 42  LDLCALC 60  TRIG 68  CHOLHDL 2.8   Thyroid Function Tests: No results for input(s): TSH, T4TOTAL, T3FREE, THYROIDAB in the last 72 hours.  Invalid input(s): FREET3 Anemia Panel: No results for input(s): VITAMINB12, FOLATE, FERRITIN, TIBC, IRON, RETICCTPCT in the last 72 hours.  CT ABDOMEN PELVIS WO CONTRAST  Result Date: 10/29/2020 CLINICAL DATA:  Abdominal pain with nausea and vomiting. Lithotripsy today. EXAM: CT ABDOMEN AND PELVIS WITHOUT CONTRAST TECHNIQUE: Multidetector CT imaging of the abdomen and pelvis was performed following the standard protocol without IV contrast. COMPARISON:  Most recent CT 02/07/2020 FINDINGS: Lower chest: No pleural fluid or focal airspace disease. Coronary artery calcifications again seen. Hepatobiliary: Hepatic steatosis. No evidence of focal lesion on noncontrast exam. Gallbladder physiologically distended, no calcified stone. No biliary dilatation. Pancreas: No ductal dilatation or inflammation. Spleen: Normal in size without focal abnormality. Adrenals/Urinary Tract: No adrenal nodule. Hyperdense subcapsular collection in the mid and inferior left kidney measures approximately 2.6 x 6.3 x 7.4 cm and is typical of subcapsular hematoma. This causes mild mass effect on underlying renal parenchyma. Nonobstructing stones in the mid lower left  kidney, largest stone or stone fragments in the lower pole span 19 mm, series 5, image 58. Moderate left perinephric stranding. There is no hydronephrosis. No ureteral calculi. Previous cyst in the left kidney is grossly stable from prior. No definite right renal calculi. No right hydronephrosis. There is mild right perinephric edema. No right ureteral stone. Unremarkable urinary bladder.  There is no bladder stone. Stomach/Bowel: Stomach partially distended. There is no bowel obstruction or inflammation. Air-filled appendix without appendicitis. Small to moderate  stool burden in the colon. Left colonic diverticulosis without diverticulitis. No colonic inflammation. Vascular/Lymphatic: Moderate aortic atherosclerosis. Mild branch atherosclerosis. No aortic aneurysm. No abdominopelvic adenopathy. Reproductive: Enlarged prostate gland spans 5.2 cm transverse. Other: No ascites or free air. Musculoskeletal: Multilevel degenerative change throughout the spine. There are no acute or suspicious osseous abnormalities. IMPRESSION: 1. Post recent lithotripsy with left renal subcapsular hematoma measuring 2.6 x 6.3 x 7.4 cm and perinephric stranding. Hematoma causes mild mass effect on underlying renal parenchyma. Recommend correlation for hypertension in the setting (Page kidney). 2. Nonobstructing stones in the mid lower left kidney, largest stone or stone fragments in the lower pole span 19 mm. No hydronephrosis. No ureteral stone or calculi. 3. Colonic diverticulosis without diverticulitis. 4. Enlarged prostate gland. 5. Hepatic steatosis. Aortic Atherosclerosis (ICD10-I70.0). Electronically Signed   By: Keith Rake M.D.   On: 10/29/2020 19:44   CT Angio Chest PE W and/or Wo Contrast  Result Date: 10/29/2020 CLINICAL DATA:  Tachycardia EXAM: CT ANGIOGRAPHY CHEST WITH CONTRAST TECHNIQUE: Multidetector CT imaging of the chest was performed using the standard protocol during bolus administration of intravenous contrast. Multiplanar CT image reconstructions and MIPs were obtained to evaluate the vascular anatomy. CONTRAST:  4mL OMNIPAQUE IOHEXOL 350 MG/ML SOLN COMPARISON:  None. FINDINGS: Cardiovascular: There is slightly suboptimal opacification of the main pulmonary artery, however no central or proximal segmental pulmonary embolism. The heart is normal in size. No pericardial effusion or thickening. No evidence right heart strain. There is normal three-vessel brachiocephalic anatomy without proximal stenosis. Scattered aortic atherosclerosis is noted. Coronary artery  calcifications are seen. Mediastinum/Nodes: No hilar, mediastinal, or axillary adenopathy. Thyroid gland, trachea, and esophagus demonstrate no significant findings. Lungs/Pleura: Streaky airspace opacity is seen at the right lung base. No pleural effusion or pneumothorax. No airspace consolidation. Upper Abdomen: No acute abnormalities present in the visualized portions of the upper abdomen. Musculoskeletal: No chest wall abnormality. No acute or significant osseous findings. Review of the MIP images confirms the above findings. IMPRESSION: Slightly suboptimal opacification of the main pulmonary artery however no central or proximal segmental pulmonary embolism. Streaky atelectasis at both lung bases. No other acute intrathoracic pathology to explain the patient's symptoms. Electronically Signed   By: Prudencio Pair M.D.   On: 10/29/2020 22:31   DG Chest Port 1 View  Result Date: 10/30/2020 CLINICAL DATA:  Tachycardia EXAM: PORTABLE CHEST 1 VIEW COMPARISON:  06/17/2020 FINDINGS: Heart and mediastinal contours are within normal limits. No focal opacities or effusions. No acute bony abnormality. IMPRESSION: No active disease. Electronically Signed   By: Rolm Baptise M.D.   On: 10/30/2020 03:30   ECHOCARDIOGRAM COMPLETE  Result Date: 10/30/2020    ECHOCARDIOGRAM REPORT   Patient Name:   John Hartman Date of Exam: 10/30/2020 Medical Rec #:  [id number]   Height:       71.0 in Accession #:    [id number]  Weight:       198.6 lb Date of Birth:  03/19/1971    BSA:  2.102 m Patient Age:    49 years    BP:           174/124 mmHg Patient Gender: M           HR:           90 bpm. Exam Location:  [facility] Procedure: 2D Echo, Cardiac Doppler and Color Doppler Indications:     NSTEMI 121.4  History:         Patient has prior history of Echocardiogram examinations, most                  recent 01/29/2020. Previous Myocardial Infarction; Risk                  Factors:Hypertension and Diabetes. LVH.  Sonographer:     Sherrie Sport  RDCS (AE) Referring Phys:  [place] Diagnosing Phys: Isaias Cowman MD IMPRESSIONS  1. Left ventricular ejection fraction, by estimation, is 60 to 65%. The left ventricle has hyperdynamic function. The left ventricle has no regional wall motion abnormalities. Left ventricular diastolic parameters are consistent with Grade I diastolic dysfunction (impaired relaxation).  2. Right ventricular systolic function is normal. The right ventricular size is normal.  3. The mitral valve is normal in structure. Trivial mitral valve regurgitation. No evidence of mitral stenosis.  4. The aortic valve is normal in structure. Aortic valve regurgitation is not visualized. No aortic stenosis is present.  5. The inferior vena cava is normal in size with greater than 50% respiratory variability, suggesting right atrial pressure of 3 mmHg. FINDINGS  Left Ventricle: Left ventricular ejection fraction, by estimation, is 60 to 65%. The left ventricle has hyperdynamic function. The left ventricle has no regional wall motion abnormalities. The left ventricular internal cavity size was normal in size. There is no left ventricular hypertrophy. Left ventricular diastolic parameters are consistent with Grade I diastolic dysfunction (impaired relaxation). Right Ventricle: The right ventricular size is normal. No increase in right ventricular wall thickness. Right ventricular systolic function is normal. Left Atrium: Left atrial size was normal in size. Right Atrium: Right atrial size was normal in size. Pericardium: There is no evidence of pericardial effusion. Mitral Valve: The mitral valve is normal in structure. Trivial mitral valve regurgitation. No evidence of mitral valve stenosis. Tricuspid Valve: The tricuspid valve is normal in structure. Tricuspid valve regurgitation is trivial. No evidence of tricuspid stenosis. Aortic Valve: The aortic valve is normal in structure. Aortic valve regurgitation is not visualized. No  aortic stenosis is present. Aortic valve mean gradient measures 3.0 mmHg. Aortic valve peak gradient measures 4.7 mmHg. Aortic valve area, by VTI measures 3.54 cm. Pulmonic Valve: The pulmonic valve was normal in structure. Pulmonic valve regurgitation is not visualized. No evidence of pulmonic stenosis. Aorta: The aortic root is normal in size and structure. Venous: The inferior vena cava is normal in size with greater than 50% respiratory variability, suggesting right atrial pressure of 3 mmHg. IAS/Shunts: No atrial level shunt detected by color flow Doppler.  LEFT VENTRICLE PLAX 2D LVIDd:         4.22 cm  Diastology LVIDs:         2.65 cm  LV e' medial:    6.53 cm/s LV PW:         1.09 cm  LV E/e' medial:  10.5 LV IVS:        0.98 cm  LV e' lateral:   11.60 cm/s LVOT diam:  2.20 cm  LV E/e' lateral: 5.9 LV SV:         46 LV SV Index:   22 LVOT Area:     3.80 cm  RIGHT VENTRICLE RV Basal diam:  3.34 cm RV S prime:     23.50 cm/s TAPSE (M-mode): 3.9 cm LEFT ATRIUM             Index       RIGHT ATRIUM           Index LA diam:        4.10 cm 1.95 cm/m  RA Area:     18.40 cm LA Vol (A2C):   34.1 ml 16.22 ml/m RA Volume:   46.05 ml  21.90 ml/m LA Vol (A4C):   27.2 ml 12.94 ml/m LA Biplane Vol: 32.1 ml 15.27 ml/m  AORTIC VALVE                   PULMONIC VALVE AV Area (Vmax):    2.94 cm    PV Vmax:        1.13 m/s AV Area (Vmean):   2.77 cm    PV Peak grad:   5.1 mmHg AV Area (VTI):     3.54 cm    RVOT Peak grad: 8 mmHg AV Vmax:           108.00 cm/s AV Vmean:          73.900 cm/s AV VTI:            0.130 m AV Peak Grad:      4.7 mmHg AV Mean Grad:      3.0 mmHg LVOT Vmax:         83.60 cm/s LVOT Vmean:        53.900 cm/s LVOT VTI:          0.121 m LVOT/AV VTI ratio: 0.93  AORTA Ao Root diam: 3.50 cm MITRAL VALVE               TRICUSPID VALVE MV Area (PHT): 6.71 cm    TR Peak grad:   15.1 mmHg MV Decel Time: 113 msec    TR Vmax:        194.00 cm/s MV E velocity: 68.60 cm/s MV A velocity: 98.10 cm/s  SHUNTS  MV E/A ratio:  0.70        Systemic VTI:  0.12 m                            Systemic Diam: 2.20 cm Isaias Cowman MD Electronically signed by Isaias Cowman MD Signature Date/Time: 10/30/2020/10:05:58 AM    Final      Echo LV hyperdynamic, with LVEF 60 to 65%  TELEMETRY: Sinus tachycardia 120 to 130 bpm:  ASSESSMENT AND PLAN:  Principal Problem:   NSTEMI (non-ST elevated myocardial infarction) ([place]) Active Problems:   Essential hypertension   Coronary artery disease   Gilbert's syndrome   Diabetes mellitus without complication (HCC)   Chronic systolic CHF (congestive heart failure) (HCC)   HFrEF (heart failure with reduced ejection fraction) (HCC)   Nephrolithiasis   OSA (obstructive sleep apnea)   Chronic pain syndrome   Perinephric hematoma    1.  Elevated troponin (637, 672, 686) due to myocardial injury ( ICD10  I5A ) in the setting of renal colic, nausea and vomiting, left perinephric hematoma, unlikely due to non-ST elevation myocardial infarction in the absence of chest  pain or new ischemic ECG changes.  Patient had very similar presentation 02/07/2020 with much higher levels of high sensitivity troponin, at which time cardiac catheterization revealed patent stent mid left circumflex without significant coronary stenosis. 2.  Coronary artery disease, status post inferior STEMI 08/23/2018, status post DES mid left circumflex, with patent stent by recent cardiac catheterization 02/10/2020 3.  Nausea and vomiting, status post ESWL, complicated by left perinephric hematoma, aspirin currently on hold 4.  Mild cardiomyopathy, LVEF 40 to 45% by echocardiogram 02/08/2020, current 2D echocardiogram reveals hyperdynamic LV function in the setting of sinus tachycardia with estimated LVEF 60 to 65% 5.  Sinus tachycardia, compensatory due to underlying condition, on diltiazem drip primarily because patient was experiencing intractable nausea and vomiting  Recommendations  1.  Agree  with current overall therapy 2.  Defer full dose anticoagulation 3.  Defer cardiac catheterization 4.  Resume metoprolol tartrate, amlodipine and hydralazine as nausea and vomiting resolved 5.  Resume aspirin as soon as perinephric hematoma improved   Isaias Cowman, MD, PhD, [facility] 10/31/2020 9:27 AM

## 2020-10-31 NOTE — Progress Notes (Signed)
John Hartman aware of Patients Blood pressure throughout shift.  Patient started on Cardizem GTTS and Metoprolol increased from 5mg  to 10mg  every 6hrs .  Patient HR 80's-110 while at rest.  0200 Patients BP 160/112 HR 88 Provider aware of blood pressure and heart rate. Provider informed nurse that there would be no new orders. Hassan Rowan updated throughout shift.  Patient complains of no pain or nausea throughout shift. No dizziness or complaints of feeling lightheaded. Able to ambulate with no problem.

## 2020-10-31 NOTE — Progress Notes (Signed)
PROGRESS NOTE    John Hartman  JAS:[id number] DOB: [birth year] DOA: 10/29/2020 PCP: [name], FNP     Brief Narrative:  John Hartman is a 49 y.o. male with medical history significant of coronary artery disease, ischemic cardiomyopathy, hypertension, diabetes, irritable bowel syndrome, kidney stones status post lithotripsy 11/4 who started feeling significant nausea after the procedure and had intractable nausea vomiting.  Unable to keep his medications down.  He is got multiple episode with pain in the left side of his abdomen.  Patient's work-up however showed increased lactic acid and rising troponins.  Patient was admitted for intractable nausea, vomiting with concern for NSTEMI.  Cardiology as well as urology were consulted.  Due to continued nausea and vomiting as well as tachycardia and lab work concerning for early DKA, he was started on IV insulin and transferred to stepdown unit.    New events last 24 hours / Subjective: Overnight, he was started on IV Cardizem drip due to continued tachycardia.  This morning, he states that his nausea and vomiting has resolved.  He did have an episode of lightheadedness when he got up this morning with an isolated incident of vomiting but other than that, he reports that he feels much better today.  Assessment & Plan:   Principal Problem:   NSTEMI (non-ST elevated myocardial infarction) [facility]) Active Problems:   Essential hypertension   Coronary artery disease   Gilbert's syndrome   Diabetes mellitus without complication (HCC)   Chronic systolic CHF (congestive heart failure) (HCC)   HFrEF (heart failure with reduced ejection fraction) (HCC)   Nephrolithiasis   OSA (obstructive sleep apnea)   Chronic pain syndrome   Perinephric hematoma   Intractable nausea, vomiting with concern for early DKA -Anion gap closed.  Nausea vomiting improved.  Transition to Lantus and sliding scale insulin and discontinue IV insulin today  Sinus  tachycardia -Resume oral Lopressor now that he is able to tolerate p.o.  Wean off of IV Cardizem today  NSTEMI type II -Likely demand ischemia -EKG reviewed independently followed sinus tachycardia without ST elevation -Appreciate cardiology -Echocardiogram showed EF 60 to 65%, no regional wall motion abnormality, grade 1 diastolic dysfunction -Denies chest pain -Cardiology following   Subcapsular hematoma status post lithotripsy -Urology following -We will hold aspirin, further ibuprofen or other blood thinning agents -Repeat H&H stable, continue to monitor  Hypertension -Continue Lopressor, hydralazine  HLD -Continue lipitor    DVT prophylaxis: Avoid anticoagulation due to renal hematoma SCDs Start: 10/30/20 0005  Code Status: Full code Family Communication: No family at bedside Disposition Plan:  Status is: Inpatient  Remains inpatient appropriate because:IV treatments appropriate due to intensity of illness or inability to take PO and Inpatient level of care appropriate due to severity of illness   Dispo: The patient is from: Home              Anticipated d/c is to: Home              Anticipated d/c date is: 1 day              Patient currently is not medically stable to d/c.  Wean off IV insulin and IV Cardizem today.  Advance diet to full liquids today   Consultants:   Cardiology  Urology   Antimicrobials:  Anti-infectives (From admission, onward)   Start     Dose/Rate Route Frequency Ordered Stop   10/29/20 2230  fluconazole (DIFLUCAN) tablet 150 mg  Status:  Discontinued  150 mg Oral  Once 10/29/20 2226 10/29/20 2227   10/29/20 2230  fluconazole (DIFLUCAN) tablet 200 mg        200 mg Oral  Once 10/29/20 2227 10/29/20 2247       Objective: Vitals:   10/31/20 0700 10/31/20 0800 10/31/20 0900 10/31/20 1000  BP: (!) 174/110 (!) 152/102 (!) 143/110 (!) 148/90  Pulse: 98 92 (!) 118 (!) 118  Resp: 15 19 13  (!) 25  Temp:  98.3 F (36.8 C)     TempSrc:  Oral    SpO2: 97% 94% 97% 98%  Weight:      Height:        Intake/Output Summary (Last 24 hours) at 10/31/2020 1121 Last data filed at 10/31/2020 0730 Gross per 24 hour  Intake 2830.83 ml  Output 1825 ml  Net 1005.83 ml   Filed Weights   10/29/20 1728 10/30/20 0611  Weight: 94.5 kg 90.1 kg    Examination: General exam: Appears calm and comfortable  Respiratory system: Clear to auscultation. Respiratory effort normal. Cardiovascular system: S1 & S2 heard, tachycardic, regular rhythm. No pedal edema. Gastrointestinal system: Abdomen is nondistended, soft and nontender. Normal bowel sounds heard. Central nervous system: Alert and oriented. Non focal exam. Speech clear  Extremities: Symmetric in appearance bilaterally  Skin: No rashes, lesions or ulcers on exposed skin  Psychiatry: Judgement and insight appear stable. Mood & affect appropriate.     Data Reviewed: I have personally reviewed following labs and imaging studies  CBC: Recent Labs  Lab 10/29/20 1730 10/30/20 1055 10/31/20 0537  WBC 15.4* 15.3* 16.5*  HGB 16.9 17.3* 16.3  HCT 51.1 52.0 49.2  MCV 80.5 79.5* 79.7*  PLT 254 244 211   Basic Metabolic Panel: Recent Labs  Lab 10/30/20 1055 10/30/20 1346 10/30/20 2027 10/31/20 0032 10/31/20 0537  NA 138 138 139 133* 133*  K 3.7 3.8 3.6 3.5 3.5  CL 99 101 103 98 96*  CO2 23 23 25 24 23   GLUCOSE 202* 180* 172* 175* 180*  BUN 17 18 19 18 17   CREATININE 1.35* 1.32* 1.23 1.30* 1.21  CALCIUM 9.1 8.8* 9.1 9.1 9.3   GFR: Estimated Creatinine Clearance: 80.4 mL/min (by C-G formula based on SCr of 1.21 mg/dL). Liver Function Tests: Recent Labs  Lab 10/29/20 1730 10/30/20 1055  AST 70* 36  ALT 77* 57*  ALKPHOS 105 93  BILITOT 3.0* 3.1*  PROT 9.0* 8.4*  ALBUMIN 5.5* 4.9   Recent Labs  Lab 10/29/20 1730 10/31/20 0032  LIPASE 46 45   No results for input(s): AMMONIA in the last 168 hours. Coagulation Profile: No results for input(s): INR,  PROTIME in the last 168 hours. Cardiac Enzymes: No results for input(s): CKTOTAL, CKMB, CKMBINDEX, TROPONINI in the last 168 hours. BNP (last 3 results) No results for input(s): PROBNP in the last 8760 hours. HbA1C: Recent Labs    10/30/20 0027  HGBA1C 7.4*   CBG: Recent Labs  Lab 10/31/20 0607 10/31/20 0738 10/31/20 0901 10/31/20 1003 10/31/20 1101  GLUCAP 189* 154* 179* 226* 188*   Lipid Profile: Recent Labs    10/30/20 0027  CHOL 116  HDL 42  LDLCALC 60  TRIG 68  CHOLHDL 2.8   Thyroid Function Tests: No results for input(s): TSH, T4TOTAL, FREET4, T3FREE, THYROIDAB in the last 72 hours. Anemia Panel: No results for input(s): VITAMINB12, FOLATE, FERRITIN, TIBC, IRON, RETICCTPCT in the last 72 hours. Sepsis Labs: Recent Labs  Lab 10/29/20 1934 10/29/20 2136  LATICACIDVEN 3.8*  1.5    Recent Results (from the past 240 hour(s))  SARS CORONAVIRUS 2 (TAT 6-24 HRS) Nasopharyngeal Nasopharyngeal Swab     Status: None   Collection Time: 10/27/20  9:13 AM   Specimen: Nasopharyngeal Swab  Result Value Ref Range Status   SARS Coronavirus 2 NEGATIVE NEGATIVE Final    Comment: (NOTE) SARS-CoV-2 target nucleic acids are NOT DETECTED.  The SARS-CoV-2 RNA is generally detectable in upper and lower respiratory specimens during the acute phase of infection. Negative results do not preclude SARS-CoV-2 infection, do not rule out co-infections with other pathogens, and should not be used as the sole basis for treatment or other patient management decisions. Negative results must be combined with clinical observations, patient history, and epidemiological information. The expected result is Negative.  Fact Sheet for Patients: SugarRoll.be  Fact Sheet for Healthcare Providers: https://www.woods-mathews.com/  This test is not yet approved or cleared by the [place] FDA and  has been authorized for detection and/or diagnosis of  SARS-CoV-2 by FDA under an Emergency Use Authorization (EUA). This EUA will remain  in effect (meaning this test can be used) for the duration of the COVID-19 declaration under Se ction 564(b)(1) of the Act, 21 U.S.C. section 360bbb-3(b)(1), unless the authorization is terminated or revoked sooner.  Performed at [facility], [place] [address] [place], [place] [address]   Respiratory Panel by RT PCR (Flu A&B, Covid) - Nasopharyngeal Swab     Status: None   Collection Time: 10/30/20 12:27 AM   Specimen: Nasopharyngeal Swab  Result Value Ref Range Status   SARS Coronavirus 2 by RT PCR NEGATIVE NEGATIVE Final    Comment: (NOTE) SARS-CoV-2 target nucleic acids are NOT DETECTED.  The SARS-CoV-2 RNA is generally detectable in upper respiratoy specimens during the acute phase of infection. The lowest concentration of SARS-CoV-2 viral copies this assay can detect is 131 copies/mL. A negative result does not preclude SARS-Cov-2 infection and should not be used as the sole basis for treatment or other patient management decisions. A negative result may occur with  improper specimen collection/handling, submission of specimen other than nasopharyngeal swab, presence of viral mutation(s) within the areas targeted by this assay, and inadequate number of viral copies (<131 copies/mL). A negative result must be combined with clinical observations, patient history, and epidemiological information. The expected result is Negative.  Fact Sheet for Patients:  PinkCheek.be  Fact Sheet for Healthcare Providers:  GravelBags.it  This test is no t yet approved or cleared by the [place] FDA and  has been authorized for detection and/or diagnosis of SARS-CoV-2 by FDA under an Emergency Use Authorization (EUA). This EUA will remain  in effect (meaning this test can be used) for the duration of the COVID-19 declaration under Section  564(b)(1) of the Act, 21 U.S.C. section 360bbb-3(b)(1), unless the authorization is terminated or revoked sooner.     Influenza A by PCR NEGATIVE NEGATIVE Final   Influenza B by PCR NEGATIVE NEGATIVE Final    Comment: (NOTE) The Xpert Xpress SARS-CoV-2/FLU/RSV assay is intended as an aid in  the diagnosis of influenza from Nasopharyngeal swab specimens and  should not be used as a sole basis for treatment. Nasal washings and  aspirates are unacceptable for Xpert Xpress SARS-CoV-2/FLU/RSV  testing.  Fact Sheet for Patients: PinkCheek.be  Fact Sheet for Healthcare Providers: GravelBags.it  This test is not yet approved or cleared by the [place] FDA and  has been authorized for detection and/or diagnosis of SARS-CoV-2  by  FDA under an Emergency Use Authorization (EUA). This EUA will remain  in effect (meaning this test can be used) for the duration of the  Covid-19 declaration under Section 564(b)(1) of the Act, 21  U.S.C. section 360bbb-3(b)(1), unless the authorization is  terminated or revoked. Performed at [facility], [address] [place], [place] [address]       Radiology Studies: CT ABDOMEN PELVIS WO CONTRAST  Result Date: 10/29/2020 CLINICAL DATA:  Abdominal pain with nausea and vomiting. Lithotripsy today. EXAM: CT ABDOMEN AND PELVIS WITHOUT CONTRAST TECHNIQUE: Multidetector CT imaging of the abdomen and pelvis was performed following the standard protocol without IV contrast. COMPARISON:  Most recent CT 02/07/2020 FINDINGS: Lower chest: No pleural fluid or focal airspace disease. Coronary artery calcifications again seen. Hepatobiliary: Hepatic steatosis. No evidence of focal lesion on noncontrast exam. Gallbladder physiologically distended, no calcified stone. No biliary dilatation. Pancreas: No ductal dilatation or inflammation. Spleen: Normal in size without focal abnormality. Adrenals/Urinary  Tract: No adrenal nodule. Hyperdense subcapsular collection in the mid and inferior left kidney measures approximately 2.6 x 6.3 x 7.4 cm and is typical of subcapsular hematoma. This causes mild mass effect on underlying renal parenchyma. Nonobstructing stones in the mid lower left kidney, largest stone or stone fragments in the lower pole span 19 mm, series 5, image 58. Moderate left perinephric stranding. There is no hydronephrosis. No ureteral calculi. Previous cyst in the left kidney is grossly stable from prior. No definite right renal calculi. No right hydronephrosis. There is mild right perinephric edema. No right ureteral stone. Unremarkable urinary bladder.  There is no bladder stone. Stomach/Bowel: Stomach partially distended. There is no bowel obstruction or inflammation. Air-filled appendix without appendicitis. Small to moderate stool burden in the colon. Left colonic diverticulosis without diverticulitis. No colonic inflammation. Vascular/Lymphatic: Moderate aortic atherosclerosis. Mild branch atherosclerosis. No aortic aneurysm. No abdominopelvic adenopathy. Reproductive: Enlarged prostate gland spans 5.2 cm transverse. Other: No ascites or free air. Musculoskeletal: Multilevel degenerative change throughout the spine. There are no acute or suspicious osseous abnormalities. IMPRESSION: 1. Post recent lithotripsy with left renal subcapsular hematoma measuring 2.6 x 6.3 x 7.4 cm and perinephric stranding. Hematoma causes mild mass effect on underlying renal parenchyma. Recommend correlation for hypertension in the setting ([place] kidney). 2. Nonobstructing stones in the mid lower left kidney, largest stone or stone fragments in the lower pole span 19 mm. No hydronephrosis. No ureteral stone or calculi. 3. Colonic diverticulosis without diverticulitis. 4. Enlarged prostate gland. 5. Hepatic steatosis. Aortic Atherosclerosis (ICD10-I70.0). Electronically Signed   By: Keith Rake M.D.   On: 10/29/2020  19:44   CT Angio Chest PE W and/or Wo Contrast  Result Date: 10/29/2020 CLINICAL DATA:  Tachycardia EXAM: CT ANGIOGRAPHY CHEST WITH CONTRAST TECHNIQUE: Multidetector CT imaging of the chest was performed using the standard protocol during bolus administration of intravenous contrast. Multiplanar CT image reconstructions and MIPs were obtained to evaluate the vascular anatomy. CONTRAST:  39mL OMNIPAQUE IOHEXOL 350 MG/ML SOLN COMPARISON:  None. FINDINGS: Cardiovascular: There is slightly suboptimal opacification of the main pulmonary artery, however no central or proximal segmental pulmonary embolism. The heart is normal in size. No pericardial effusion or thickening. No evidence right heart strain. There is normal three-vessel brachiocephalic anatomy without proximal stenosis. Scattered aortic atherosclerosis is noted. Coronary artery calcifications are seen. Mediastinum/Nodes: No hilar, mediastinal, or axillary adenopathy. Thyroid gland, trachea, and esophagus demonstrate no significant findings. Lungs/Pleura: Streaky airspace opacity is seen at the right lung base. No pleural  effusion or pneumothorax. No airspace consolidation. Upper Abdomen: No acute abnormalities present in the visualized portions of the upper abdomen. Musculoskeletal: No chest wall abnormality. No acute or significant osseous findings. Review of the MIP images confirms the above findings. IMPRESSION: Slightly suboptimal opacification of the main pulmonary artery however no central or proximal segmental pulmonary embolism. Streaky atelectasis at both lung bases. No other acute intrathoracic pathology to explain the patient's symptoms. Electronically Signed   By: Prudencio Pair M.D.   On: 10/29/2020 22:31   DG Chest Port 1 View  Result Date: 10/30/2020 CLINICAL DATA:  Tachycardia EXAM: PORTABLE CHEST 1 VIEW COMPARISON:  06/17/2020 FINDINGS: Heart and mediastinal contours are within normal limits. No focal opacities or effusions. No acute  bony abnormality. IMPRESSION: No active disease. Electronically Signed   By: Rolm Baptise M.D.   On: 10/30/2020 03:30   ECHOCARDIOGRAM COMPLETE  Result Date: 10/30/2020    ECHOCARDIOGRAM REPORT   Patient Name:   [name] Vanepps Date of Exam: 10/30/2020 Medical Rec #:  [id number]   Height:       71.0 in Accession #:    [id number]  Weight:       198.6 lb Date of Birth:  22-Mar-1971    BSA:          2.102 m Patient Age:    71 years    BP:           174/124 mmHg Patient Gender: M           HR:           90 bpm. Exam Location:  [facility] Procedure: 2D Echo, Cardiac Doppler and Color Doppler Indications:     NSTEMI 121.4  History:         Patient has prior history of Echocardiogram examinations, most                  recent 01/29/2020. Previous Myocardial Infarction; Risk                  Factors:Hypertension and Diabetes. LVH.  Sonographer:     Sherrie Sport RDCS (AE) Referring Phys:  Interior Diagnosing Phys: Isaias Cowman MD IMPRESSIONS  1. Left ventricular ejection fraction, by estimation, is 60 to 65%. The left ventricle has hyperdynamic function. The left ventricle has no regional wall motion abnormalities. Left ventricular diastolic parameters are consistent with Grade I diastolic dysfunction (impaired relaxation).  2. Right ventricular systolic function is normal. The right ventricular size is normal.  3. The mitral valve is normal in structure. Trivial mitral valve regurgitation. No evidence of mitral stenosis.  4. The aortic valve is normal in structure. Aortic valve regurgitation is not visualized. No aortic stenosis is present.  5. The inferior vena cava is normal in size with greater than 50% respiratory variability, suggesting right atrial pressure of 3 mmHg. FINDINGS  Left Ventricle: Left ventricular ejection fraction, by estimation, is 60 to 65%. The left ventricle has hyperdynamic function. The left ventricle has no regional wall motion abnormalities. The left ventricular internal cavity size was  normal in size. There is no left ventricular hypertrophy. Left ventricular diastolic parameters are consistent with Grade I diastolic dysfunction (impaired relaxation). Right Ventricle: The right ventricular size is normal. No increase in right ventricular wall thickness. Right ventricular systolic function is normal. Left Atrium: Left atrial size was normal in size. Right Atrium: Right atrial size was normal in size. Pericardium: There is no evidence of pericardial effusion. Mitral Valve:  The mitral valve is normal in structure. Trivial mitral valve regurgitation. No evidence of mitral valve stenosis. Tricuspid Valve: The tricuspid valve is normal in structure. Tricuspid valve regurgitation is trivial. No evidence of tricuspid stenosis. Aortic Valve: The aortic valve is normal in structure. Aortic valve regurgitation is not visualized. No aortic stenosis is present. Aortic valve mean gradient measures 3.0 mmHg. Aortic valve peak gradient measures 4.7 mmHg. Aortic valve area, by VTI measures 3.54 cm. Pulmonic Valve: The pulmonic valve was normal in structure. Pulmonic valve regurgitation is not visualized. No evidence of pulmonic stenosis. Aorta: The aortic root is normal in size and structure. Venous: The inferior vena cava is normal in size with greater than 50% respiratory variability, suggesting right atrial pressure of 3 mmHg. IAS/Shunts: No atrial level shunt detected by color flow Doppler.  LEFT VENTRICLE PLAX 2D LVIDd:         4.22 cm  Diastology LVIDs:         2.65 cm  LV e' medial:    6.53 cm/s LV PW:         1.09 cm  LV E/e' medial:  10.5 LV IVS:        0.98 cm  LV e' lateral:   11.60 cm/s LVOT diam:     2.20 cm  LV E/e' lateral: 5.9 LV SV:         46 LV SV Index:   22 LVOT Area:     3.80 cm  RIGHT VENTRICLE RV Basal diam:  3.34 cm RV S prime:     23.50 cm/s TAPSE (M-mode): 3.9 cm LEFT ATRIUM             Index       RIGHT ATRIUM           Index LA diam:        4.10 cm 1.95 cm/m  RA Area:     18.40 cm  LA Vol (A2C):   34.1 ml 16.22 ml/m RA Volume:   46.05 ml  21.90 ml/m LA Vol (A4C):   27.2 ml 12.94 ml/m LA Biplane Vol: 32.1 ml 15.27 ml/m  AORTIC VALVE                   PULMONIC VALVE AV Area (Vmax):    2.94 cm    PV Vmax:        1.13 m/s AV Area (Vmean):   2.77 cm    PV Peak grad:   5.1 mmHg AV Area (VTI):     3.54 cm    RVOT Peak grad: 8 mmHg AV Vmax:           108.00 cm/s AV Vmean:          73.900 cm/s AV VTI:            0.130 m AV Peak Grad:      4.7 mmHg AV Mean Grad:      3.0 mmHg LVOT Vmax:         83.60 cm/s LVOT Vmean:        53.900 cm/s LVOT VTI:          0.121 m LVOT/AV VTI ratio: 0.93  AORTA Ao Root diam: 3.50 cm MITRAL VALVE               TRICUSPID VALVE MV Area (PHT): 6.71 cm    TR Peak grad:   15.1 mmHg MV Decel Time: 113 msec    TR Vmax:  194.00 cm/s MV E velocity: 68.60 cm/s MV A velocity: 98.10 cm/s  SHUNTS MV E/A ratio:  0.70        Systemic VTI:  0.12 m                            Systemic Diam: 2.20 cm Isaias Cowman MD Electronically signed by Isaias Cowman MD Signature Date/Time: 10/30/2020/10:05:58 AM    Final       Scheduled Meds:  allopurinol  100 mg Oral Daily   atorvastatin  80 mg Oral Q supper   Chlorhexidine Gluconate Cloth  6 each Topical Daily   diphenhydrAMINE  50 mg Oral QHS   ezetimibe  10 mg Oral Daily   famotidine  10 mg Oral Daily   hydrALAZINE  25 mg Oral BID   insulin aspart  0-9 Units Subcutaneous TID WC   insulin detemir  15 Units Subcutaneous Q12H   insulin glargine  10 Units Subcutaneous QHS   loratadine  10 mg Oral Daily   metoprolol tartrate  25 mg Oral BID   pregabalin  50 mg Oral BID   tamsulosin  0.4 mg Oral Daily   vitamin B-12  1,000 mcg Oral Daily   Continuous Infusions:  sodium chloride Stopped (10/30/20 1439)   dextrose 5% lactated ringers 125 mL/hr at 10/31/20 0629   diltiazem (CARDIZEM) infusion 15 mg/hr (10/31/20 0600)   insulin 5 Units/hr (10/31/20 1117)   lactated ringers       LOS: 2  days      Time spent: 40 minutes   Dessa Phi, DO [facility] 10/31/2020, 11:21 AM   Available via Epic secure chat 7am-7pm After these hours, please refer to coverage provider listed on [email or web address]

## 2020-11-01 DIAGNOSIS — I214 Non-ST elevation (NSTEMI) myocardial infarction: Secondary | ICD-10-CM | POA: Diagnosis not present

## 2020-11-01 LAB — CBC
HCT: 44.2 % (ref 39.0–52.0)
Hemoglobin: 15.2 g/dL (ref 13.0–17.0)
MCH: 27 pg (ref 26.0–34.0)
MCHC: 34.4 g/dL (ref 30.0–36.0)
MCV: 78.5 fL — ABNORMAL LOW (ref 80.0–100.0)
Platelets: 178 10*3/uL (ref 150–400)
RBC: 5.63 MIL/uL (ref 4.22–5.81)
RDW: 13.2 % (ref 11.5–15.5)
WBC: 12.6 10*3/uL — ABNORMAL HIGH (ref 4.0–10.5)
nRBC: 0 % (ref 0.0–0.2)

## 2020-11-01 LAB — GLUCOSE, CAPILLARY
Glucose-Capillary: 199 mg/dL — ABNORMAL HIGH (ref 70–99)
Glucose-Capillary: 182 mg/dL — ABNORMAL HIGH (ref 70–99)
Glucose-Capillary: 186 mg/dL — ABNORMAL HIGH (ref 70–99)

## 2020-11-01 LAB — BASIC METABOLIC PANEL
Anion gap: 9 (ref 5–15)
BUN: 19 mg/dL (ref 6–20)
CO2: 26 mmol/L (ref 22–32)
Calcium: 9.1 mg/dL (ref 8.9–10.3)
Chloride: 97 mmol/L — ABNORMAL LOW (ref 98–111)
Creatinine, Ser: 1.27 mg/dL — ABNORMAL HIGH (ref 0.61–1.24)
GFR, Estimated: >60 mL/min (ref >60)
Glucose, Bld: 181 mg/dL — ABNORMAL HIGH (ref 70–99)
Potassium: 3.4 mmol/L — ABNORMAL LOW (ref 3.5–5.1)
Sodium: 132 mmol/L — ABNORMAL LOW (ref 135–145)

## 2020-11-01 MED ORDER — METOPROLOL TARTRATE 50 MG PO TABS
50.0000 mg | ORAL_TABLET | Freq: Two times a day (BID) | ORAL | Status: DC
  Administered 2020-11-01: 50 mg via ORAL
  Filled 2020-11-01: qty 1

## 2020-11-01 MED ORDER — SIMETHICONE 80 MG PO CHEW
80.0000 mg | CHEWABLE_TABLET | Freq: Four times a day (QID) | ORAL | Status: DC | PRN
  Administered 2020-11-01: 80 mg via ORAL
  Filled 2020-11-01 (×3): qty 1

## 2020-11-01 MED ORDER — METOPROLOL TARTRATE 25 MG PO TABS: 50.0000 mg | ORAL_TABLET | Freq: Two times a day (BID) | ORAL | 0 refills | Status: DC

## 2020-11-01 MED ORDER — ASPIRIN EC 81 MG PO TBEC
81.0000 mg | DELAYED_RELEASE_TABLET | Freq: Every day | ORAL | Status: DC
  Administered 2020-11-01: 81 mg via ORAL
  Filled 2020-11-01: qty 1

## 2020-11-01 MED ORDER — POTASSIUM CHLORIDE CRYS ER 20 MEQ PO TBCR
40.0000 meq | EXTENDED_RELEASE_TABLET | Freq: Once | ORAL | Status: AC
  Administered 2020-11-01: 40 meq via ORAL
  Filled 2020-11-01: qty 2

## 2020-11-01 MED ORDER — METFORMIN HCL 1000 MG PO TABS: 1000.0000 mg | ORAL_TABLET | Freq: Two times a day (BID) | ORAL | 0 refills | Status: DC

## 2020-11-01 NOTE — Progress Notes (Signed)
[facility] Cardiology  SUBJECTIVE: Patient laying in bed, eating breakfast, reports feeling much better, denies nausea and vomiting x24 hours   Vitals:   11/01/20 0400 11/01/20 0500 11/01/20 0600 11/01/20 0911  BP: (!) 162/109  (!) 157/110 (!) 150/113  Pulse: 94 (!) 109 (!) 101 88  Resp: 17 17 15 18   Temp:    98.4 F (36.9 C)  TempSrc:    Oral  SpO2: 96% 99% 96% 98%  Weight:    88.5 kg  Height:    5\' 11"  (1.803 m)     Intake/Output Summary (Last 24 hours) at 11/01/2020 9211 Last data filed at 11/01/2020 0800 Gross per 24 hour  Intake 320.09 ml  Output 3150 ml  Net -2829.91 ml      PHYSICAL EXAM  General: Well developed, well nourished, in no acute distress HEENT:  Normocephalic and atramatic Neck:  No JVD.  Lungs: Clear bilaterally to auscultation and percussion. Heart: HRRR . Normal S1 and S2 without gallops or murmurs.  Abdomen: Bowel sounds are positive, abdomen soft and non-tender  Msk:  Back normal, normal gait. Normal strength and tone for age. Extremities: No clubbing, cyanosis or edema.   Neuro: Alert and oriented X 3. Psych:  Good affect, responds appropriately   LABS: Basic Metabolic Panel: Recent Labs    10/31/20 0537 11/01/20 0459  NA 133* 132*  K 3.5 3.4*  CL 96* 97*  CO2 23 26  GLUCOSE 180* 181*  BUN 17 19  CREATININE 1.21 1.27*  CALCIUM 9.3 9.1   Liver Function Tests: Recent Labs    10/29/20 1730 10/30/20 1055  AST 70* 36  ALT 77* 57*  ALKPHOS 105 93  BILITOT 3.0* 3.1*  PROT 9.0* 8.4*  ALBUMIN 5.5* 4.9   Recent Labs    10/29/20 1730 10/31/20 0032  LIPASE 46 45   CBC: Recent Labs    10/31/20 0537 11/01/20 0459  WBC 16.5* 12.6*  HGB 16.3 15.2  HCT 49.2 44.2  MCV 79.7* 78.5*  PLT 214 178   Cardiac Enzymes: No results for input(s): CKTOTAL, CKMB, CKMBINDEX, TROPONINI in the last 72 hours. BNP: Invalid input(s): POCBNP D-Dimer: No results for input(s): DDIMER in the last 72 hours. Hemoglobin A1C: Recent Labs     10/30/20 0027  HGBA1C 7.4*   Fasting Lipid Panel: Recent Labs    10/30/20 0027  CHOL 116  HDL 42  LDLCALC 60  TRIG 68  CHOLHDL 2.8   Thyroid Function Tests: No results for input(s): TSH, T4TOTAL, T3FREE, THYROIDAB in the last 72 hours.  Invalid input(s): FREET3 Anemia Panel: No results for input(s): VITAMINB12, FOLATE, FERRITIN, TIBC, IRON, RETICCTPCT in the last 72 hours.  ECHOCARDIOGRAM COMPLETE  Result Date: 10/30/2020    ECHOCARDIOGRAM REPORT   Patient Name:   John Hartman Date of Exam: 10/30/2020 Medical Rec #:  [id number]   Height:       71.0 in Accession #:    [id number]  Weight:       198.6 lb Date of Birth:  [birth year]    BSA:          2.102 m Patient Age:    49 years    BP:           174/124 mmHg Patient Gender: M           HR:           90 bpm. Exam Location:  [facility] Procedure: 2D Echo, Cardiac Doppler and Color Doppler Indications:  NSTEMI 121.4  History:         Patient has prior history of Echocardiogram examinations, most                  recent 01/29/2020. Previous Myocardial Infarction; Risk                  Factors:Hypertension and Diabetes. LVH.  Sonographer:     Sherrie Sport RDCS (AE) Referring Phys:  [place] Diagnosing Phys: Isaias Cowman MD IMPRESSIONS  1. Left ventricular ejection fraction, by estimation, is 60 to 65%. The left ventricle has hyperdynamic function. The left ventricle has no regional wall motion abnormalities. Left ventricular diastolic parameters are consistent with Grade I diastolic dysfunction (impaired relaxation).  2. Right ventricular systolic function is normal. The right ventricular size is normal.  3. The mitral valve is normal in structure. Trivial mitral valve regurgitation. No evidence of mitral stenosis.  4. The aortic valve is normal in structure. Aortic valve regurgitation is not visualized. No aortic stenosis is present.  5. The inferior vena cava is normal in size with greater than 50% respiratory variability, suggesting right  atrial pressure of 3 mmHg. FINDINGS  Left Ventricle: Left ventricular ejection fraction, by estimation, is 60 to 65%. The left ventricle has hyperdynamic function. The left ventricle has no regional wall motion abnormalities. The left ventricular internal cavity size was normal in size. There is no left ventricular hypertrophy. Left ventricular diastolic parameters are consistent with Grade I diastolic dysfunction (impaired relaxation). Right Ventricle: The right ventricular size is normal. No increase in right ventricular wall thickness. Right ventricular systolic function is normal. Left Atrium: Left atrial size was normal in size. Right Atrium: Right atrial size was normal in size. Pericardium: There is no evidence of pericardial effusion. Mitral Valve: The mitral valve is normal in structure. Trivial mitral valve regurgitation. No evidence of mitral valve stenosis. Tricuspid Valve: The tricuspid valve is normal in structure. Tricuspid valve regurgitation is trivial. No evidence of tricuspid stenosis. Aortic Valve: The aortic valve is normal in structure. Aortic valve regurgitation is not visualized. No aortic stenosis is present. Aortic valve mean gradient measures 3.0 mmHg. Aortic valve peak gradient measures 4.7 mmHg. Aortic valve area, by VTI measures 3.54 cm. Pulmonic Valve: The pulmonic valve was normal in structure. Pulmonic valve regurgitation is not visualized. No evidence of pulmonic stenosis. Aorta: The aortic root is normal in size and structure. Venous: The inferior vena cava is normal in size with greater than 50% respiratory variability, suggesting right atrial pressure of 3 mmHg. IAS/Shunts: No atrial level shunt detected by color flow Doppler.  LEFT VENTRICLE PLAX 2D LVIDd:         4.22 cm  Diastology LVIDs:         2.65 cm  LV e' medial:    6.53 cm/s LV PW:         1.09 cm  LV E/e' medial:  10.5 LV IVS:        0.98 cm  LV e' lateral:   11.60 cm/s LVOT diam:     2.20 cm  LV E/e' lateral: 5.9 LV  SV:         46 LV SV Index:   22 LVOT Area:     3.80 cm  RIGHT VENTRICLE RV Basal diam:  3.34 cm RV S prime:     23.50 cm/s TAPSE (M-mode): 3.9 cm LEFT ATRIUM  Index       RIGHT ATRIUM           Index LA diam:        4.10 cm 1.95 cm/m  RA Area:     18.40 cm LA Vol (A2C):   34.1 ml 16.22 ml/m RA Volume:   46.05 ml  21.90 ml/m LA Vol (A4C):   27.2 ml 12.94 ml/m LA Biplane Vol: 32.1 ml 15.27 ml/m  AORTIC VALVE                   PULMONIC VALVE AV Area (Vmax):    2.94 cm    PV Vmax:        1.13 m/s AV Area (Vmean):   2.77 cm    PV Peak grad:   5.1 mmHg AV Area (VTI):     3.54 cm    RVOT Peak grad: 8 mmHg AV Vmax:           108.00 cm/s AV Vmean:          73.900 cm/s AV VTI:            0.130 m AV Peak Grad:      4.7 mmHg AV Mean Grad:      3.0 mmHg LVOT Vmax:         83.60 cm/s LVOT Vmean:        53.900 cm/s LVOT VTI:          0.121 m LVOT/AV VTI ratio: 0.93  AORTA Ao Root diam: 3.50 cm MITRAL VALVE               TRICUSPID VALVE MV Area (PHT): 6.71 cm    TR Peak grad:   15.1 mmHg MV Decel Time: 113 msec    TR Vmax:        194.00 cm/s MV E velocity: 68.60 cm/s MV A velocity: 98.10 cm/s  SHUNTS MV E/A ratio:  0.70        Systemic VTI:  0.12 m                            Systemic Diam: 2.20 cm Isaias Cowman MD Electronically signed by Isaias Cowman MD Signature Date/Time: 10/30/2020/10:05:58 AM    Final      Echo LV hyperdynamic with LVEF 60 to 65%  TELEMETRY: Sinus rhythm at 91 bpm:  ASSESSMENT AND PLAN:  Principal Problem:   NSTEMI (non-ST elevated myocardial infarction) [facility]) Active Problems:   Essential hypertension   Coronary artery disease   Gilbert's syndrome   Diabetes mellitus without complication (HCC)   Chronic systolic CHF (congestive heart failure) (HCC)   HFrEF (heart failure with reduced ejection fraction) (HCC)   Nephrolithiasis   OSA (obstructive sleep apnea)   Chronic pain syndrome   Perinephric hematoma    1. Elevated troponin(637,672, 686)due to  myocardial injury( ICD10 I5A )in the setting of renal colic, nausea and vomiting, left perinephric hematoma, unlikely due to non-ST elevation myocardial infarction in the absence of chest pain or new ischemic ECG changes. Patient had very similar presentation 02/07/2020 with much higher levels of high sensitivity troponin, at which time cardiac catheterization revealedpatent stent mid left circumflex without significant coronary stenosis. 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  Sinus tachycardia, compensatory due to underlying condition, on diltiazem drip primarily because patient was experiencing intractable nausea and vomiting is now resolved.  Patient back on metoprolol tartrate 50 mg twice daily.  Telemetry reveals sinus rhythm at 91 bpm.  Recommendations  1.Agree with current overall therapy 2.Defer full dose anticoagulation 3.Defer cardiac catheterization 4.  Resume metoprolol tartrate, amlodipine and hydralazine  5.  Resume aspirin  6.  DC diltiazem drip 7.  Ambulate patient today, if patient does well may discharge home 8.  Follow-up as outpatient in 1 to 2 weeks   Isaias Cowman, MD, PhD, [facility] 11/01/2020 9:25 AM

## 2020-11-01 NOTE — Discharge Summary (Signed)
Physician Discharge Summary  John Hartman VVO:[id number] DOB: 07-13-1971 DOA: 10/29/2020  PCP: [name], FNP  Admit date: 10/29/2020 Discharge date: 11/01/2020  Admitted From: Home Disposition:  Home  Recommendations for Outpatient Follow-up:  1. Follow up with PCP in 1 week 2. Follow up with cardiology Dr. Saralyn Pilar in 1 to 2 weeks 3. Follow-up with endocrinology in 1 to 2 weeks.  John Hartman was stopped and Metformin started in its place due to concern for early DKA 4. Follow-up with urology with outpatient renal ultrasound in 2 to 4 weeks 5. Please obtain CBC to recheck hemoglobin level  Discharge Condition: Stable CODE STATUS: Full code Diet recommendation: Heart healthy/carb modified  Brief/Interim Summary: John Hartman is a 49 y.o.malewith medical history significant ofcoronary artery disease, ischemic cardiomyopathy, hypertension, diabetes, irritable bowel syndrome, kidney stones status post lithotripsy 11/4 who started feeling significant nausea after the procedure and had intractable nausea vomiting. Unable to keep his medications down. He is got multiple episode with pain in the left side of his abdomen. Patient's work-up however showed increased lactic acid and rising troponins.  Patient was admitted for intractable nausea, vomiting with concern for NSTEMI.  Cardiology as well as urology were consulted.  Due to continued nausea and vomiting as well as tachycardia and lab work concerning for early DKA, he was started on IV insulin and transferred to stepdown unit.   Patient continued to have clinical improvement.  He was transitioned to Lantus and sliding scale insulin.  He will resume home Metformin on discharge, to stop Xigduo.  Due to sinus tachycardia, patient was started on IV Cardizem which was weaned off.  His Lopressor dose was increased.  Cardiology plans to follow-up with patient as an outpatient.  Urology will repeat renal ultrasound as an outpatient.  Hemoglobin remained  stable.  On day of discharge, patient was able to tolerate diet, ambulate without any issues.  Discharge Diagnoses:  Principal Problem:   NSTEMI (non-ST elevated myocardial infarction) [facility]) Active Problems:   Essential hypertension   Coronary artery disease   Gilbert's syndrome   Diabetes mellitus without complication (HCC)   Chronic systolic CHF (congestive heart failure) (HCC)   HFrEF (heart failure with reduced ejection fraction) (HCC)   Nephrolithiasis   OSA (obstructive sleep apnea)   Chronic pain syndrome   Perinephric hematoma   Intractable nausea, vomiting with concern for early DKA -Resolved -Stop Xigduo due to [place] having contraindication for DKA.  Prescribed Metformin, continue Rybelsus.  Follow-up with endocrinology as an outpatient  Sinus tachycardia -Improved and weaned off IV Cardizem.  Lopressor dose increased  NSTEMI type II -Likely demand ischemia -EKG reviewed independently followed sinus tachycardia without ST elevation -Appreciate cardiology -Echocardiogram showed EF 60 to 65%, no regional wall motion abnormality, grade 1 diastolic dysfunction -Denies chest pain -Cardiology following, follow-up as outpatient -Resume aspirin  Subcapsular hematoma status post lithotripsy -Urology following, follow-up as outpatient with outpatient renal ultrasound  Hypertension -Continue Lopressor, hydralazine  HLD -Continue lipitor   Hypokalemia -Replaced   Discharge Instructions  Discharge Instructions    Call MD for:   Complete by: As directed    Blood pressure < 90/60, heart rate < 60   Call MD for:  difficulty breathing, headache or visual disturbances   Complete by: As directed    Call MD for:  extreme fatigue   Complete by: As directed    Call MD for:  persistant dizziness or light-headedness   Complete by: As directed    Call MD for:  persistant nausea and vomiting   Complete by: As directed    Call MD for:  severe uncontrolled pain    Complete by: As directed    Call MD for:  temperature >100.4   Complete by: As directed    Discharge instructions   Complete by: As directed    You were cared for by a hospitalist during your hospital stay. If you have any questions about your discharge medications or the care you received while you were in the hospital after you are discharged, you can call the unit and ask to speak with the hospitalist on call if the hospitalist that took care of you is not available. Once you are discharged, your primary care physician will handle any further medical issues. Please note that NO REFILLS for any discharge medications will be authorized once you are discharged, as it is imperative that you return to your primary care physician (or establish a relationship with a primary care physician if you do not have one) for your aftercare needs so that they can reassess your need for medications and monitor your lab values.   Increase activity slowly   Complete by: As directed      Allergies as of 11/01/2020      Reactions   Imdur [isosorbide Nitrate]    Lisinopril Swelling   Other Hives   adhesives   Drug [tape] Itching   Rash, blisters   Tapentadol Itching   Rash, blisters   Ace Inhibitors Swelling      Medication List    STOP taking these medications   ibuprofen 800 MG tablet Commonly known as: ADVIL   methocarbamol 500 MG tablet Commonly known as: ROBAXIN   Xigduo XR 04-999 MG Tb24 Generic drug: Dapagliflozin-metFORMIN HCl ER     TAKE these medications   allopurinol 100 MG tablet Commonly known as: ZYLOPRIM Take 100 mg by mouth daily.   amLODipine 10 MG tablet Commonly known as: NORVASC Take 1 tablet (10 mg total) by mouth daily.   aspirin EC 81 MG tablet Take 81 mg by mouth daily.   atorvastatin 80 MG tablet Commonly known as: LIPITOR Take 1 tablet (80 mg total) by mouth daily at 6 PM. What changed: when to take this   BIOFREEZE EX Apply 1 application topically 3  (three) times daily as needed (muscle cramps/shoulder pain.).   cetirizine 10 MG tablet Commonly known as: ZYRTEC Take 10 mg by mouth at bedtime.   diphenhydrAMINE 25 MG tablet Commonly known as: BENADRYL Take 50 mg by mouth at bedtime.   ezetimibe 10 MG tablet Commonly known as: ZETIA Take 1 tablet (10 mg total) by mouth daily.   famotidine 10 MG tablet Commonly known as: PEPCID Take 10 mg by mouth daily.   hydrALAZINE 25 MG tablet Commonly known as: APRESOLINE Take 1 tablet (25 mg total) by mouth 2 (two) times daily.   HYDROcodone-acetaminophen 5-325 MG tablet Commonly known as: NORCO/VICODIN Take 1-2 tablets by mouth every 6 (six) hours as needed for moderate pain.   metFORMIN 1000 MG tablet Commonly known as: Glucophage Take 1 tablet (1,000 mg total) by mouth 2 (two) times daily with a meal.   metoprolol tartrate 25 MG tablet Commonly known as: LOPRESSOR Take 2 tablets (50 mg total) by mouth 2 (two) times daily. What changed: how much to take   nitroGLYCERIN 0.4 MG SL tablet Commonly known as: NITROSTAT Place 1 tablet (0.4 mg total) under the tongue every 5 (five) minutes as needed for chest  pain.   ondansetron 4 MG disintegrating tablet Commonly known as: Zofran ODT Take 1 tablet (4 mg total) by mouth every 8 (eight) hours as needed for nausea or vomiting.   ondansetron 4 MG tablet Commonly known as: Zofran Take 1 tablet (4 mg total) by mouth every 8 (eight) hours as needed for nausea or vomiting.   OneTouch Ultra test strip Generic drug: glucose blood SMARTSIG:Via Meter   pregabalin 50 MG capsule Commonly known as: LYRICA Take 1 capsule (50 mg total) by mouth 3 (three) times daily. What changed:   when to take this  additional instructions   Rybelsus 3 MG Tabs Generic drug: Semaglutide Take 3 mg by mouth daily at 6 (six) AM.   tamsulosin 0.4 MG Caps capsule Commonly known as: Flomax Take 1 capsule (0.4 mg total) by mouth daily.   vitamin B-12  1000 MCG tablet Commonly known as: CYANOCOBALAMIN Take 1,000 mcg by mouth daily.       Follow-up Information    [name], FNP. Schedule an appointment as soon as possible for a visit in 1 week(s).   Specialty: Family Medicine Contact information: [address] [place] [address] [phone]        Judi Cong, MD. Schedule an appointment as soon as possible for a visit in 2 week(s).   Specialty: Endocrinology Contact information: [place] [address] [phone]        Isaias Cowman, MD. Schedule an appointment as soon as possible for a visit in 1 week(s).   Specialty: Cardiology Contact information: [facility] [place] [address] [phone]        Billey Co, MD. Schedule an appointment as soon as possible for a visit in 2 week(s).   Specialty: Urology Contact information: [place] [address] [phone]              Allergies  Allergen Reactions  . Imdur [Isosorbide Nitrate]   . Lisinopril Swelling  . Other Hives    adhesives  . Drug [Tape] Itching    Rash, blisters  . Tapentadol Itching    Rash, blisters  . Ace Inhibitors Swelling    Consultations:  Urology  Cardiology   Procedures/Studies: CT ABDOMEN PELVIS WO CONTRAST  Result Date: 10/29/2020 CLINICAL DATA:  Abdominal pain with nausea and vomiting. Lithotripsy today. EXAM: CT ABDOMEN AND PELVIS WITHOUT CONTRAST TECHNIQUE: Multidetector CT imaging of the abdomen and pelvis was performed following the standard protocol without IV contrast. COMPARISON:  Most recent CT 02/07/2020 FINDINGS: Lower chest: No pleural fluid or focal airspace disease. Coronary artery calcifications again seen. Hepatobiliary: Hepatic steatosis. No evidence of focal lesion on noncontrast exam. Gallbladder physiologically distended, no calcified stone. No biliary  dilatation. Pancreas: No ductal dilatation or inflammation. Spleen: Normal in size without focal abnormality. Adrenals/Urinary Tract: No adrenal nodule. Hyperdense subcapsular collection in the mid and inferior left kidney measures approximately 2.6 x 6.3 x 7.4 cm and is typical of subcapsular hematoma. This causes mild mass effect on underlying renal parenchyma. Nonobstructing stones in the mid lower left kidney, largest stone or stone fragments in the lower pole span 19 mm, series 5, image 58. Moderate left perinephric stranding. There is no hydronephrosis. No ureteral calculi. Previous cyst in the left kidney is grossly stable from prior. No definite right renal calculi. No right hydronephrosis. There is mild right perinephric edema. No right ureteral stone. Unremarkable urinary bladder.  There is no bladder stone. Stomach/Bowel: Stomach partially distended. There is no bowel obstruction or inflammation. Air-filled appendix without appendicitis. Small to moderate stool burden in the colon. Left colonic diverticulosis without diverticulitis. No colonic inflammation. Vascular/Lymphatic: Moderate aortic atherosclerosis. Mild branch atherosclerosis. No aortic aneurysm. No abdominopelvic adenopathy. Reproductive: Enlarged prostate gland spans 5.2 cm transverse. Other: No ascites or free air. Musculoskeletal: Multilevel degenerative change throughout the spine. There are no acute or suspicious osseous abnormalities. IMPRESSION: 1. Post recent lithotripsy with left renal subcapsular hematoma measuring 2.6 x 6.3 x 7.4 cm and perinephric stranding. Hematoma causes mild mass effect on underlying renal parenchyma. Recommend correlation for hypertension in the setting (Page kidney). 2. Nonobstructing stones in the mid lower left kidney, largest stone or stone fragments in the lower pole span 19 mm. No hydronephrosis. No ureteral stone or calculi. 3. Colonic diverticulosis without diverticulitis. 4. Enlarged prostate gland.  5. Hepatic steatosis. Aortic Atherosclerosis (ICD10-I70.0). Electronically Signed   By: Keith Rake M.D.   On: 10/29/2020 19:44   DG Abd 1 View  Result Date: 10/29/2020 CLINICAL DATA:  Left renal stone EXAM: ABDOMEN - 1 VIEW COMPARISON:  10/13/2020 FINDINGS: A 6 mm calculus of the lower pole the left kidney is again identified. No additional calculi are identified. Bowel gas pattern is unremarkable. IMPRESSION: Unchanged 6 mm left renal calculus. Electronically Signed   By: Macy Mis M.D.   On: 10/29/2020 08:05   Abdomen 1 view (KUB)  Result Date: 10/14/2020 CLINICAL DATA:  Follow-up right kidney stone. EXAM: ABDOMEN - 1 VIEW COMPARISON:  Abdomen radiograph and abdomen and pelvis CT dated 02/07/2020 FINDINGS: Normal bowel gas pattern. Stable 7 mm lower pole left renal calculus. No radiographically visible right renal, ureteral or bladder calculi. Lumbar spine degenerative changes, most pronounced at the L1-2 level. IMPRESSION: Stable 7 mm lower pole left renal calculus. No additional calculi visualized. Electronically Signed   By: Claudie Revering M.D.   On: 10/14/2020 15:00   CT Angio Chest PE W and/or Wo Contrast  Result Date: 10/29/2020 CLINICAL DATA:  Tachycardia EXAM: CT ANGIOGRAPHY CHEST WITH CONTRAST TECHNIQUE: Multidetector CT imaging of the chest was performed using the standard protocol during bolus administration of intravenous contrast. Multiplanar CT image reconstructions and MIPs were obtained to evaluate the vascular anatomy. CONTRAST:  34mL OMNIPAQUE IOHEXOL 350 MG/ML SOLN COMPARISON:  None. FINDINGS: Cardiovascular: There is slightly suboptimal opacification of the main pulmonary artery, however no central or proximal segmental pulmonary embolism. The heart is normal in size. No pericardial effusion or thickening. No evidence right heart strain. There is normal three-vessel brachiocephalic anatomy without proximal stenosis. Scattered aortic atherosclerosis is noted. Coronary  artery calcifications are seen. Mediastinum/Nodes: No hilar, mediastinal, or axillary adenopathy. Thyroid gland, trachea, and esophagus demonstrate no significant findings. Lungs/Pleura: Streaky airspace opacity is seen at the right lung base. No pleural effusion or pneumothorax. No airspace consolidation. Upper Abdomen: No acute abnormalities present in the visualized portions of the upper abdomen. Musculoskeletal: No chest wall abnormality. No acute or significant osseous findings. Review of the MIP images confirms the above findings. IMPRESSION: Slightly suboptimal opacification of the main pulmonary artery however no central or proximal segmental pulmonary embolism. Streaky atelectasis at both lung bases. No other acute intrathoracic pathology to explain the patient's symptoms. Electronically Signed   By: Prudencio Pair M.D.   On: 10/29/2020 22:31   DG Chest Port 1 View  Result Date: 10/30/2020 CLINICAL DATA:  Tachycardia EXAM: PORTABLE CHEST 1 VIEW COMPARISON:  06/17/2020 FINDINGS: Heart  and mediastinal contours are within normal limits. No focal opacities or effusions. No acute bony abnormality. IMPRESSION: No active disease. Electronically Signed   By: Rolm Baptise M.D.   On: 10/30/2020 03:30   ECHOCARDIOGRAM COMPLETE  Result Date: 10/30/2020    ECHOCARDIOGRAM REPORT   Patient Name:   John Hartman Date of Exam: 10/30/2020 Medical Rec #:  [id number]   Height:       71.0 in Accession #:    [id number]  Weight:       198.6 lb Date of Birth:  29-Jun-1971    BSA:          2.102 m Patient Age:    50 years    BP:           174/124 mmHg Patient Gender: M           HR:           90 bpm. Exam Location:  [facility] Procedure: 2D Echo, Cardiac Doppler and Color Doppler Indications:     NSTEMI 121.4  History:         Patient has prior history of Echocardiogram examinations, most                  recent 01/29/2020. Previous Myocardial Infarction; Risk                  Factors:Hypertension and Diabetes. LVH.  Sonographer:     Sherrie Sport RDCS (AE) Referring Phys:  [place] Diagnosing Phys: Isaias Cowman MD IMPRESSIONS  1. Left ventricular ejection fraction, by estimation, is 60 to 65%. The left ventricle has hyperdynamic function. The left ventricle has no regional wall motion abnormalities. Left ventricular diastolic parameters are consistent with Grade I diastolic dysfunction (impaired relaxation).  2. Right ventricular systolic function is normal. The right ventricular size is normal.  3. The mitral valve is normal in structure. Trivial mitral valve regurgitation. No evidence of mitral stenosis.  4. The aortic valve is normal in structure. Aortic valve regurgitation is not visualized. No aortic stenosis is present.  5. The inferior vena cava is normal in size with greater than 50% respiratory variability, suggesting right atrial pressure of 3 mmHg. FINDINGS  Left Ventricle: Left ventricular ejection fraction, by estimation, is 60 to 65%. The left ventricle has hyperdynamic function. The left ventricle has no regional wall motion abnormalities. The left ventricular internal cavity size was normal in size. There is no left ventricular hypertrophy. Left ventricular diastolic parameters are consistent with Grade I diastolic dysfunction (impaired relaxation). Right Ventricle: The right ventricular size is normal. No increase in right ventricular wall thickness. Right ventricular systolic function is normal. Left Atrium: Left atrial size was normal in size. Right Atrium: Right atrial size was normal in size. Pericardium: There is no evidence of pericardial effusion. Mitral Valve: The mitral valve is normal in structure. Trivial mitral valve regurgitation. No evidence of mitral valve stenosis. Tricuspid Valve: The tricuspid valve is normal in structure. Tricuspid valve regurgitation is trivial. No evidence of tricuspid stenosis. Aortic Valve: The aortic valve is normal in structure. Aortic valve regurgitation is not visualized.  No aortic stenosis is present. Aortic valve mean gradient measures 3.0 mmHg. Aortic valve peak gradient measures 4.7 mmHg. Aortic valve area, by VTI measures 3.54 cm. Pulmonic Valve: The pulmonic valve was normal in structure. Pulmonic valve regurgitation is not visualized. No evidence of pulmonic stenosis. Aorta: The aortic root is normal in size and structure. Venous: The inferior vena  cava is normal in size with greater than 50% respiratory variability, suggesting right atrial pressure of 3 mmHg. IAS/Shunts: No atrial level shunt detected by color flow Doppler.  LEFT VENTRICLE PLAX 2D LVIDd:         4.22 cm  Diastology LVIDs:         2.65 cm  LV e' medial:    6.53 cm/s LV PW:         1.09 cm  LV E/e' medial:  10.5 LV IVS:        0.98 cm  LV e' lateral:   11.60 cm/s LVOT diam:     2.20 cm  LV E/e' lateral: 5.9 LV SV:         46 LV SV Index:   22 LVOT Area:     3.80 cm  RIGHT VENTRICLE RV Basal diam:  3.34 cm RV S prime:     23.50 cm/s TAPSE (M-mode): 3.9 cm LEFT ATRIUM             Index       RIGHT ATRIUM           Index LA diam:        4.10 cm 1.95 cm/m  RA Area:     18.40 cm LA Vol (A2C):   34.1 ml 16.22 ml/m RA Volume:   46.05 ml  21.90 ml/m LA Vol (A4C):   27.2 ml 12.94 ml/m LA Biplane Vol: 32.1 ml 15.27 ml/m  AORTIC VALVE                   PULMONIC VALVE AV Area (Vmax):    2.94 cm    PV Vmax:        1.13 m/s AV Area (Vmean):   2.77 cm    PV Peak grad:   5.1 mmHg AV Area (VTI):     3.54 cm    RVOT Peak grad: 8 mmHg AV Vmax:           108.00 cm/s AV Vmean:          73.900 cm/s AV VTI:            0.130 m AV Peak Grad:      4.7 mmHg AV Mean Grad:      3.0 mmHg LVOT Vmax:         83.60 cm/s LVOT Vmean:        53.900 cm/s LVOT VTI:          0.121 m LVOT/AV VTI ratio: 0.93  AORTA Ao Root diam: 3.50 cm MITRAL VALVE               TRICUSPID VALVE MV Area (PHT): 6.71 cm    TR Peak grad:   15.1 mmHg MV Decel Time: 113 msec    TR Vmax:        194.00 cm/s MV E velocity: 68.60 cm/s MV A velocity: 98.10 cm/s   SHUNTS MV E/A ratio:  0.70        Systemic VTI:  0.12 m                            Systemic Diam: 2.20 cm Isaias Cowman MD Electronically signed by Isaias Cowman MD Signature Date/Time: 10/30/2020/10:05:58 AM    Final        Discharge Exam: Vitals:   11/01/20 0911 11/01/20 1147  BP: (!) 150/113 (!) 136/100  Pulse: 88 91  Resp: 18  18  Temp: 98.4 F (36.9 C) 98.7 F (37.1 C)  SpO2: 98% 99%    General: Pt is alert, awake, not in acute distress Cardiovascular: RRR, S1/S2 +, no edema Respiratory: CTA bilaterally, no wheezing, no rhonchi, no respiratory distress, no conversational dyspnea  Abdominal: Soft, NT, ND, bowel sounds + Extremities: no edema, no cyanosis Psych: Normal mood and affect, stable judgement and insight     The results of significant diagnostics from this hospitalization (including imaging, microbiology, ancillary and laboratory) are listed below for reference.     Microbiology: Recent Results (from the past 240 hour(s))  SARS CORONAVIRUS 2 (TAT 6-24 HRS) Nasopharyngeal Nasopharyngeal Swab     Status: None   Collection Time: 10/27/20  9:13 AM   Specimen: Nasopharyngeal Swab  Result Value Ref Range Status   SARS Coronavirus 2 NEGATIVE NEGATIVE Final    Comment: (NOTE) SARS-CoV-2 target nucleic acids are NOT DETECTED.  The SARS-CoV-2 RNA is generally detectable in upper and lower respiratory specimens during the acute phase of infection. Negative results do not preclude SARS-CoV-2 infection, do not rule out co-infections with other pathogens, and should not be used as the sole basis for treatment or other patient management decisions. Negative results must be combined with clinical observations, patient history, and epidemiological information. The expected result is Negative.  Fact Sheet for Patients: SugarRoll.be  Fact Sheet for Healthcare Providers: https://www.woods-mathews.com/  This test is not  yet approved or cleared by the [place] FDA and  has been authorized for detection and/or diagnosis of SARS-CoV-2 by FDA under an Emergency Use Authorization (EUA). This EUA will remain  in effect (meaning this test can be used) for the duration of the COVID-19 declaration under Se ction 564(b)(1) of the Act, 21 U.S.C. section 360bbb-3(b)(1), unless the authorization is terminated or revoked sooner.  Performed at [facility], [place] [address] [place], [place] [address]   Respiratory Panel by RT PCR (Flu A&B, Covid) - Nasopharyngeal Swab     Status: None   Collection Time: 10/30/20 12:27 AM   Specimen: Nasopharyngeal Swab  Result Value Ref Range Status   SARS Coronavirus 2 by RT PCR NEGATIVE NEGATIVE Final    Comment: (NOTE) SARS-CoV-2 target nucleic acids are NOT DETECTED.  The SARS-CoV-2 RNA is generally detectable in upper respiratoy specimens during the acute phase of infection. The lowest concentration of SARS-CoV-2 viral copies this assay can detect is 131 copies/mL. A negative result does not preclude SARS-Cov-2 infection and should not be used as the sole basis for treatment or other patient management decisions. A negative result may occur with  improper specimen collection/handling, submission of specimen other than nasopharyngeal swab, presence of viral mutation(s) within the areas targeted by this assay, and inadequate number of viral copies (<131 copies/mL). A negative result must be combined with clinical observations, patient history, and epidemiological information. The expected result is Negative.  Fact Sheet for Patients:  PinkCheek.be  Fact Sheet for Healthcare Providers:  GravelBags.it  This test is no t yet approved or cleared by the [place] FDA and  has been authorized for detection and/or diagnosis of SARS-CoV-2 by FDA under an Emergency Use Authorization (EUA). This EUA will remain   in effect (meaning this test can be used) for the duration of the COVID-19 declaration under Section 564(b)(1) of the Act, 21 U.S.C. section 360bbb-3(b)(1), unless the authorization is terminated or revoked sooner.     Influenza A by PCR NEGATIVE NEGATIVE Final   Influenza B by  PCR NEGATIVE NEGATIVE Final    Comment: (NOTE) The Xpert Xpress SARS-CoV-2/FLU/RSV assay is intended as an aid in  the diagnosis of influenza from Nasopharyngeal swab specimens and  should not be used as a sole basis for treatment. Nasal washings and  aspirates are unacceptable for Xpert Xpress SARS-CoV-2/FLU/RSV  testing.  Fact Sheet for Patients: PinkCheek.be  Fact Sheet for Healthcare Providers: GravelBags.it  This test is not yet approved or cleared by the [place] FDA and  has been authorized for detection and/or diagnosis of SARS-CoV-2 by  FDA under an Emergency Use Authorization (EUA). This EUA will remain  in effect (meaning this test can be used) for the duration of the  Covid-19 declaration under Section 564(b)(1) of the Act, 21  U.S.C. section 360bbb-3(b)(1), unless the authorization is  terminated or revoked. Performed at [facility], [address] [place], [place] [address]      Labs: BNP (last 3 results) Recent Labs    02/07/20 0806  BNP 85.6   Basic Metabolic Panel: Recent Labs  Lab 10/30/20 1346 10/30/20 2027 10/31/20 0032 10/31/20 0537 11/01/20 0459  NA 138 139 133* 133* 132*  K 3.8 3.6 3.5 3.5 3.4*  CL 101 103 98 96* 97*  CO2 23 25 24 23 26   GLUCOSE 180* 172* 175* 180* 181*  BUN 18 19 18 17 19   CREATININE 1.32* 1.23 1.30* 1.21 1.27*  CALCIUM 8.8* 9.1 9.1 9.3 9.1   Liver Function Tests: Recent Labs  Lab 10/29/20 1730 10/30/20 1055  AST 70* 36  ALT 77* 57*  ALKPHOS 105 93  BILITOT 3.0* 3.1*  PROT 9.0* 8.4*  ALBUMIN 5.5* 4.9   Recent Labs  Lab 10/29/20 1730 10/31/20 0032  LIPASE  46 45   No results for input(s): AMMONIA in the last 168 hours. CBC: Recent Labs  Lab 10/29/20 1730 10/30/20 1055 10/31/20 0537 11/01/20 0459  WBC 15.4* 15.3* 16.5* 12.6*  HGB 16.9 17.3* 16.3 15.2  HCT 51.1 52.0 49.2 44.2  MCV 80.5 79.5* 79.7* 78.5*  PLT 254 244 214 178   Cardiac Enzymes: No results for input(s): CKTOTAL, CKMB, CKMBINDEX, TROPONINI in the last 168 hours. BNP: Invalid input(s): POCBNP CBG: Recent Labs  Lab 10/31/20 1954 10/31/20 2329 11/01/20 0352 11/01/20 0723 11/01/20 1148  GLUCAP 186* 230* 199* 182* 186*   D-Dimer No results for input(s): DDIMER in the last 72 hours. Hgb A1c Recent Labs    10/30/20 0027  HGBA1C 7.4*   Lipid Profile Recent Labs    10/30/20 0027  CHOL 116  HDL 42  LDLCALC 60  TRIG 68  CHOLHDL 2.8   Thyroid function studies No results for input(s): TSH, T4TOTAL, T3FREE, THYROIDAB in the last 72 hours.  Invalid input(s): FREET3 Anemia work up No results for input(s): VITAMINB12, FOLATE, FERRITIN, TIBC, IRON, RETICCTPCT in the last 72 hours. Urinalysis    Component Value Date/Time   COLORURINE YELLOW (A) 10/29/2020 1852   APPEARANCEUR HAZY (A) 10/29/2020 1852   LABSPEC 1.012 10/29/2020 1852   PHURINE 5.0 10/29/2020 1852   GLUCOSEU >=500 (A) 10/29/2020 1852   HGBUR LARGE (A) 10/29/2020 1852   BILIRUBINUR NEGATIVE 10/29/2020 1852   KETONESUR 80 (A) 10/29/2020 1852   PROTEINUR 100 (A) 10/29/2020 1852   NITRITE NEGATIVE 10/29/2020 1852   LEUKOCYTESUR NEGATIVE 10/29/2020 1852   Sepsis Labs Invalid input(s): PROCALCITONIN,  WBC,  LACTICIDVEN Microbiology Recent Results (from the past 240 hour(s))  SARS CORONAVIRUS 2 (TAT 6-24 HRS) Nasopharyngeal Nasopharyngeal Swab  Status: None   Collection Time: 10/27/20  9:13 AM   Specimen: Nasopharyngeal Swab  Result Value Ref Range Status   SARS Coronavirus 2 NEGATIVE NEGATIVE Final    Comment: (NOTE) SARS-CoV-2 target nucleic acids are NOT DETECTED.  The SARS-CoV-2 RNA  is generally detectable in upper and lower respiratory specimens during the acute phase of infection. Negative results do not preclude SARS-CoV-2 infection, do not rule out co-infections with other pathogens, and should not be used as the sole basis for treatment or other patient management decisions. Negative results must be combined with clinical observations, patient history, and epidemiological information. The expected result is Negative.  Fact Sheet for Patients: SugarRoll.be  Fact Sheet for Healthcare Providers: https://www.woods-mathews.com/  This test is not yet approved or cleared by the [place] FDA and  has been authorized for detection and/or diagnosis of SARS-CoV-2 by FDA under an Emergency Use Authorization (EUA). This EUA will remain  in effect (meaning this test can be used) for the duration of the COVID-19 declaration under Se ction 564(b)(1) of the Act, 21 U.S.C. section 360bbb-3(b)(1), unless the authorization is terminated or revoked sooner.  Performed at [facility], [place] [address] [place], [place] [address]   Respiratory Panel by RT PCR (Flu A&B, Covid) - Nasopharyngeal Swab     Status: None   Collection Time: 10/30/20 12:27 AM   Specimen: Nasopharyngeal Swab  Result Value Ref Range Status   SARS Coronavirus 2 by RT PCR NEGATIVE NEGATIVE Final    Comment: (NOTE) SARS-CoV-2 target nucleic acids are NOT DETECTED.  The SARS-CoV-2 RNA is generally detectable in upper respiratoy specimens during the acute phase of infection. The lowest concentration of SARS-CoV-2 viral copies this assay can detect is 131 copies/mL. A negative result does not preclude SARS-Cov-2 infection and should not be used as the sole basis for treatment or other patient management decisions. A negative result may occur with  improper specimen collection/handling, submission of specimen other than nasopharyngeal swab, presence of viral  mutation(s) within the areas targeted by this assay, and inadequate number of viral copies (<131 copies/mL). A negative result must be combined with clinical observations, patient history, and epidemiological information. The expected result is Negative.  Fact Sheet for Patients:  PinkCheek.be  Fact Sheet for Healthcare Providers:  GravelBags.it  This test is no t yet approved or cleared by the [place] FDA and  has been authorized for detection and/or diagnosis of SARS-CoV-2 by FDA under an Emergency Use Authorization (EUA). This EUA will remain  in effect (meaning this test can be used) for the duration of the COVID-19 declaration under Section 564(b)(1) of the Act, 21 U.S.C. section 360bbb-3(b)(1), unless the authorization is terminated or revoked sooner.     Influenza A by PCR NEGATIVE NEGATIVE Final   Influenza B by PCR NEGATIVE NEGATIVE Final    Comment: (NOTE) The Xpert Xpress SARS-CoV-2/FLU/RSV assay is intended as an aid in  the diagnosis of influenza from Nasopharyngeal swab specimens and  should not be used as a sole basis for treatment. Nasal washings and  aspirates are unacceptable for Xpert Xpress SARS-CoV-2/FLU/RSV  testing.  Fact Sheet for Patients: PinkCheek.be  Fact Sheet for Healthcare Providers: GravelBags.it  This test is not yet approved or cleared by the [place] FDA and  has been authorized for detection and/or diagnosis of SARS-CoV-2 by  FDA under an Emergency Use Authorization (EUA). This EUA will remain  in effect (meaning this test can be used) for the duration  of the  Covid-19 declaration under Section 564(b)(1) of the Act, 21  U.S.C. section 360bbb-3(b)(1), unless the authorization is  terminated or revoked. Performed at [facility], [address] [place], [place] [address]      Patient was seen and  examined on the day of discharge and was found to be in stable condition. Time coordinating discharge: 35 minutes including assessment and coordination of care, as well as examination of the patient.   SIGNED:  Dessa Phi, DO [facility] 11/01/2020, 12:27 PM

## 2020-11-02 ENCOUNTER — Telehealth: Payer: Self-pay | Admitting: *Deleted

## 2020-11-02 DIAGNOSIS — N200 Calculus of kidney: Secondary | ICD-10-CM

## 2020-11-02 NOTE — Telephone Encounter (Signed)
Pt's wife is calling stating that pt was discharged after being admitted for heart problems and a hematoma on his kidney. Per wife pt woke up with a low grade fever and flank pain around his kidney and some pain in his groin which is intermittent.Pt recently had lithotripsy done by Dr. Erlene Quan.  Wife states she's just nervous because pt was in ICU and now he's not feeling well again. She also states that pt's heart rate in over 130 and I advised that she call the cardiologist since he did have tachycardia. Please advise

## 2020-11-02 NOTE — Telephone Encounter (Signed)
Spoke with wife and advised results, wife did speak with cardiology and they have pt taking baby aspirin, per wife she's going to call cardiolgy back to make sure the aspirin is ok with the hematoma.

## 2020-11-02 NOTE — Telephone Encounter (Signed)
Agree with discussing tachycardia with cardiology. The flank pain and low grade fever are common with the hematoma, would recommend drinking plenty of fluids, resting, tylenol/narcotics for pain and avoid NSAIDS/aspirin.  Nickolas Madrid, MD 11/02/2020

## 2020-11-04 ENCOUNTER — Ambulatory Visit: Payer: No Typology Code available for payment source | Admitting: Urology

## 2020-11-05 ENCOUNTER — Other Ambulatory Visit: Payer: Self-pay | Admitting: Urology

## 2020-11-05 MED ORDER — HYDROCODONE-ACETAMINOPHEN 5-325 MG PO TABS: 1.0000 | ORAL_TABLET | Freq: Four times a day (QID) | ORAL | 0 refills | Status: AC | PRN

## 2020-11-05 NOTE — Telephone Encounter (Signed)
Patient calling stating that he's still in pain from the hematoma on the kidney, having low grade fever, and running out of pain meds. Per pt the tylenol is not helping, he also had a painful urination last night that lasted maybe 64mins. Pt would like to know how long will he be in pain and how long can he expect the hematoma to be there? Also just FYI pt called on-call last night and he was "almost" going to the ED.

## 2020-11-05 NOTE — Addendum Note (Signed)
Addended by: Donalee Citrin on: 11/05/2020 10:35 AM   Modules accepted: Orders

## 2020-11-05 NOTE — Telephone Encounter (Signed)
It will take a few weeks for the pain to completely resolve. The pain with urination is likely from passing small fragments of broken up stone.  I will send some more narcotic to get him through, he will have follow up with a renal US in ~2 weeks to check on the hematoma   Nickolas Madrid, MD 11/05/2020

## 2020-11-05 NOTE — Telephone Encounter (Signed)
Called pt informed him of the information below. Pt gave verbal understanding. Order for U/S placed.

## 2020-11-10 ENCOUNTER — Encounter: Payer: Self-pay | Admitting: Student in an Organized Health Care Education/Training Program

## 2020-11-10 MED ORDER — METHOCARBAMOL 500 MG PO TABS: 500.0000 mg | ORAL_TABLET | Freq: Three times a day (TID) | ORAL | 2 refills | Status: DC | PRN

## 2020-11-10 MED ORDER — TIZANIDINE HCL 4 MG PO TABS: 4.0000 mg | ORAL_TABLET | Freq: Three times a day (TID) | ORAL | 2 refills | Status: AC | PRN

## 2020-11-10 NOTE — Addendum Note (Signed)
Addended by: Gillis Santa on: 11/10/2020 03:29 PM   Modules accepted: Orders

## 2020-11-10 NOTE — Telephone Encounter (Signed)
Requested Prescriptions   Signed Prescriptions Disp Refills  . methocarbamol (ROBAXIN) 500 MG tablet 90 tablet 2    Sig: Take 1 tablet (500 mg total) by mouth every 8 (eight) hours as needed for muscle spasms.    Authorizing Provider: Gillis Santa

## 2020-11-12 ENCOUNTER — Other Ambulatory Visit: Payer: Self-pay

## 2020-11-12 ENCOUNTER — Ambulatory Visit
Admission: RE | Admit: 2020-11-12 | Discharge: 2020-11-12 | Disposition: A | Discharge status: Home / Self Care | Payer: No Typology Code available for payment source | Source: Ambulatory Visit | Attending: Urology | Admitting: Urology

## 2020-11-12 DIAGNOSIS — N200 Calculus of kidney: Secondary | ICD-10-CM | POA: Diagnosis not present

## 2020-11-16 ENCOUNTER — Other Ambulatory Visit: Payer: Self-pay

## 2020-11-16 DIAGNOSIS — N200 Calculus of kidney: Secondary | ICD-10-CM

## 2020-11-17 ENCOUNTER — Ambulatory Visit (INDEPENDENT_AMBULATORY_CARE_PROVIDER_SITE_OTHER): Payer: No Typology Code available for payment source | Admitting: Urology

## 2020-11-17 ENCOUNTER — Other Ambulatory Visit: Payer: Self-pay

## 2020-11-17 ENCOUNTER — Ambulatory Visit
Admission: RE | Admit: 2020-11-17 | Discharge: 2020-11-17 | Disposition: A | Discharge status: Home / Self Care | Payer: No Typology Code available for payment source | Source: Ambulatory Visit | Attending: Urology | Admitting: Urology

## 2020-11-17 ENCOUNTER — Ambulatory Visit
Admission: RE | Admit: 2020-11-17 | Discharge: 2020-11-17 | Disposition: A | Discharge status: Home / Self Care | Payer: No Typology Code available for payment source | Attending: Urology | Admitting: Urology

## 2020-11-17 ENCOUNTER — Encounter: Payer: Self-pay | Admitting: Urology

## 2020-11-17 VITALS — BP 131/92 | HR 98 | Ht 71.0 in | Wt 202.0 lb

## 2020-11-17 DIAGNOSIS — N200 Calculus of kidney: Secondary | ICD-10-CM | POA: Diagnosis present

## 2020-11-17 DIAGNOSIS — S37012D Minor contusion of left kidney, subsequent encounter: Secondary | ICD-10-CM

## 2020-11-17 NOTE — Patient Instructions (Signed)
Dietary Guidelines to Help Prevent Kidney Stones Kidney stones are deposits of minerals and salts that form inside your kidneys. Your risk of developing kidney stones may be greater depending on your diet, your lifestyle, the medicines you take, and whether you have certain medical conditions. Most people can reduce their chances of developing kidney stones by following the instructions below. Depending on your overall health and the type of kidney stones you tend to develop, your dietitian may give you more specific instructions. What are tips for following this plan? Reading food labels  Choose foods with "no salt added" or "low-salt" labels. Limit your sodium intake to less than 1500 mg per day.  Choose foods with calcium for each meal and snack. Try to eat about 300 mg of calcium at each meal. Foods that contain 200-500 mg of calcium per serving include: ? 8 oz (237 ml) of milk, fortified nondairy milk, and fortified fruit juice. ? 8 oz (237 ml) of kefir, yogurt, and soy yogurt. ? 4 oz (118 ml) of tofu. ? 1 oz of cheese. ? 1 cup (300 g) of dried figs. ? 1 cup (91 g) of cooked broccoli. ? 1-3 oz can of sardines or mackerel.  Most people need 1000 to 1500 mg of calcium each day. Talk to your dietitian about how much calcium is recommended for you. Shopping  Buy plenty of fresh fruits and vegetables. Most people do not need to avoid fruits and vegetables, even if they contain nutrients that may contribute to kidney stones.  When shopping for convenience foods, choose: ? Whole pieces of fruit. ? Premade salads with dressing on the side. ? Low-fat fruit and yogurt smoothies.  Avoid buying frozen meals or prepared deli foods.  Look for foods with live cultures, such as yogurt and kefir. Cooking  Do not add salt to food when cooking. Place a salt shaker on the table and allow each person to add his or her own salt to taste.  Use vegetable protein, such as beans, textured vegetable  protein (TVP), or tofu instead of meat in pasta, casseroles, and soups. Meal planning   Eat less salt, if told by your dietitian. To do this: ? Avoid eating processed or premade food. ? Avoid eating fast food.  Eat less animal protein, including cheese, meat, poultry, or fish, if told by your dietitian. To do this: ? Limit the number of times you have meat, poultry, fish, or cheese each week. Eat a diet free of meat at least 2 days a week. ? Eat only one serving each day of meat, poultry, fish, or seafood. ? When you prepare animal protein, cut pieces into small portion sizes. For most meat and fish, one serving is about the size of one deck of cards.  Eat at least 5 servings of fresh fruits and vegetables each day. To do this: ? Keep fruits and vegetables on hand for snacks. ? Eat 1 piece of fruit or a handful of berries with breakfast. ? Have a salad and fruit at lunch. ? Have two kinds of vegetables at dinner.  Limit foods that are high in a substance called oxalate. These include: ? Spinach. ? Rhubarb. ? Beets. ? Potato chips and french fries. ? Nuts.  If you regularly take a diuretic medicine, make sure to eat at least 1-2 fruits or vegetables high in potassium each day. These include: ? Avocado. ? Banana. ? Orange, prune, carrot, or tomato juice. ? Baked potato. ? Cabbage. ? Beans and split   peas. General instructions   Drink enough fluid to keep your urine clear or pale yellow. This is the most important thing you can do.  Talk to your health care provider and dietitian about taking daily supplements. Depending on your health and the cause of your kidney stones, you may be advised: ? Not to take supplements with vitamin C. ? To take a calcium supplement. ? To take a daily probiotic supplement. ? To take other supplements such as magnesium, fish oil, or vitamin B6.  Take all medicines and supplements as told by your health care provider.  Limit alcohol intake to no  more than 1 drink a day for nonpregnant women and 2 drinks a day for men. One drink equals 12 oz of beer, 5 oz of wine, or 1 oz of hard liquor.  Lose weight if told by your health care provider. Work with your dietitian to find strategies and an eating plan that works best for you. What foods are not recommended? Limit your intake of the following foods, or as told by your dietitian. Talk to your dietitian about specific foods you should avoid based on the type of kidney stones and your overall health. Grains Breads. Bagels. Rolls. Baked goods. Salted crackers. Cereal. Pasta. Vegetables Spinach. Rhubarb. Beets. Canned vegetables. Pickles. Olives. Meats and other protein foods Nuts. Nut butters. Large portions of meat, poultry, or fish. Salted or cured meats. Deli meats. Hot dogs. Sausages. Dairy Cheese. Beverages Regular soft drinks. Regular vegetable juice. Seasonings and other foods Seasoning blends with salt. Salad dressings. Canned soups. Soy sauce. Ketchup. Barbecue sauce. Canned pasta sauce. Casseroles. Pizza. Lasagna. Frozen meals. Potato chips. French fries. Summary  You can reduce your risk of kidney stones by making changes to your diet.  The most important thing you can do is drink enough fluid. You should drink enough fluid to keep your urine clear or pale yellow.  Ask your health care provider or dietitian how much protein from animal sources you should eat each day, and also how much salt and calcium you should have each day. This information is not intended to replace advice given to you by your health care provider. Make sure you discuss any questions you have with your health care provider. Document Revised: 04/03/2019 Document Reviewed: 11/22/2016 Elsevier Patient Education  2020 Elsevier Inc.  

## 2020-11-17 NOTE — Progress Notes (Signed)
   11/17/2020 2:08 PM   John Hartman 05-28-1971 [id number]  Reason for visit: Follow up nephrolithiasis, left renal hematoma  HPI: [name] is a 49 year old male with a long history of recurrent nephrolithiasis as well as extensive cardiac history.  He has not tolerated ureteral stents well at all in the past, but did relatively well with ureteroscopy when a stent was not placed previously.  He recently underwent left-sided shockwave lithotripsy for a 6 mm nonobstructing left lower pole stone with Dr. Erlene Quan on 10/13/2020.  Unfortunately this was complicated by a postoperative 7cm left renal hematoma that required admission for pain and nausea control.  Hemoglobin was stable and he did not require blood transfusion.  He reports that over the last week his pain has almost completely resolved and he is doing well.  Blood pressure today is relatively normal at 130/92.  He is not having any gross hematuria, flank pain, dysuria, or other urinary symptoms.  I personally viewed and interpreted his follow-up renal ultrasound and KUB which show a stable left renal hematoma with no evidence of hydronephrosis or residual stone fragments.  We reviewed return precautions at length.  We again discussed stone prevention strategies.    RTC 6 months with repeat renal ultrasound for hematoma resolution  Billey Co, MD  [facility] [address] [place], [place] [address] [phone]

## 2020-11-26 IMAGING — CT CT RENAL STONE PROTOCOL
2 of 4 series · 15 of 46 positions shown, 17 images · non-contrast
Comparison: March 26, 2019

CLINICAL DATA: And flank pain

EXAM:
CT ABDOMEN AND PELVIS WITHOUT CONTRAST
TECHNIQUE: Multidetector CT imaging of the abdomen and pelvis was performed
following the standard protocol without oral or IV contrast.

[Series 2: stone full standard · axial · 0.80mm/px · z∈[-648,-204]mm · 12 of 103 slices shown, 14 images]
[im 9/103  soft-tissue]
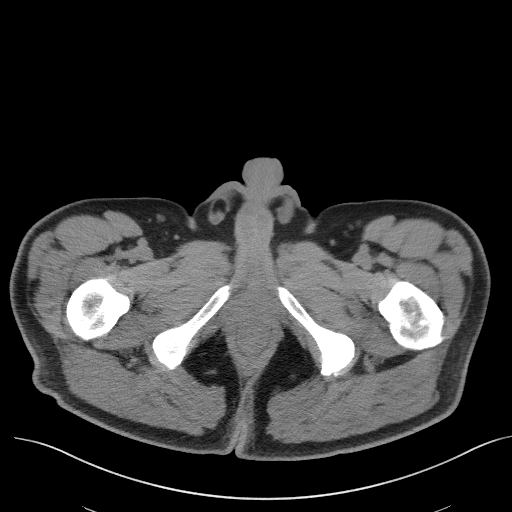
[im 9/103  bone]
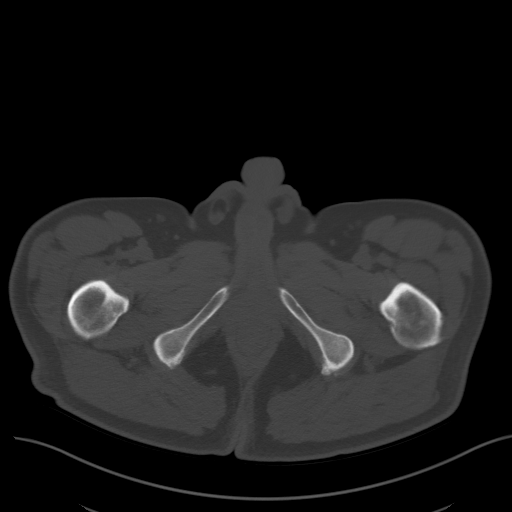
[im 17/103  soft-tissue]
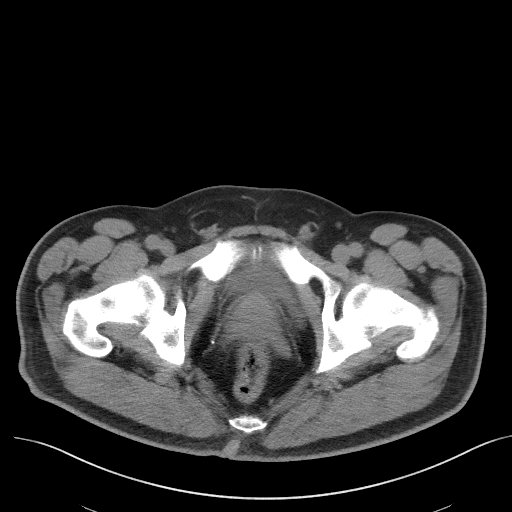
[im 25/103  soft-tissue]
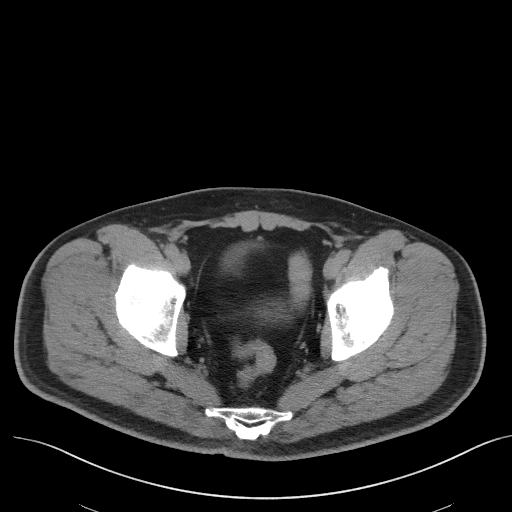
[im 33/103  soft-tissue]
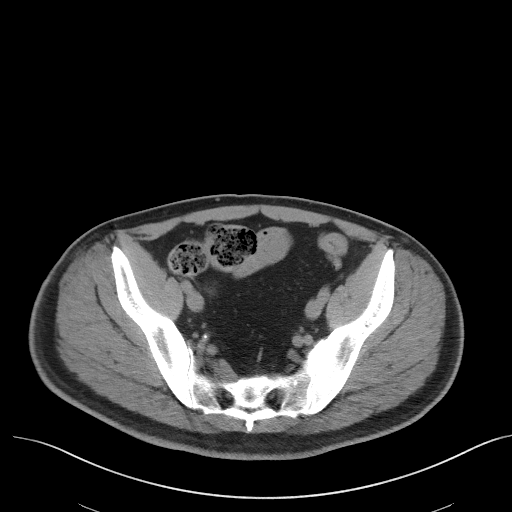
[im 41/103  soft-tissue]
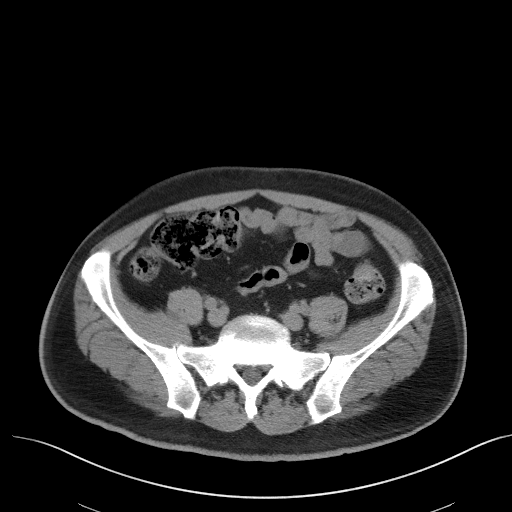
[im 49/103  soft-tissue]
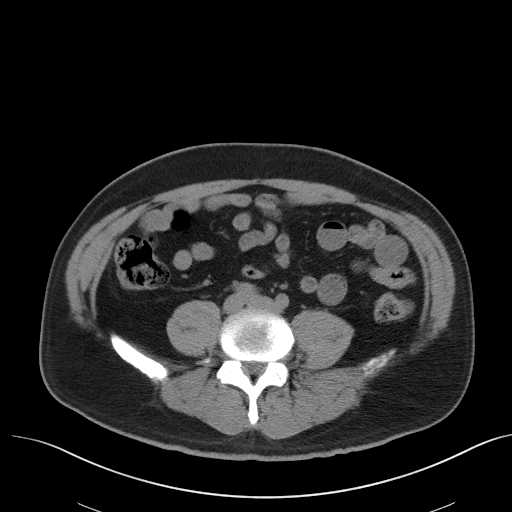
[im 58/103  soft-tissue]
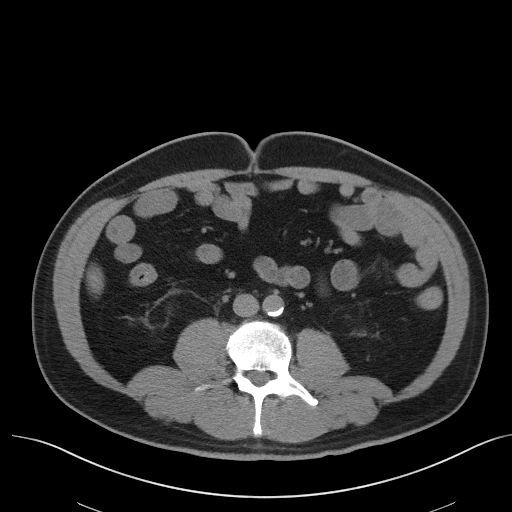
[im 66/103  soft-tissue]
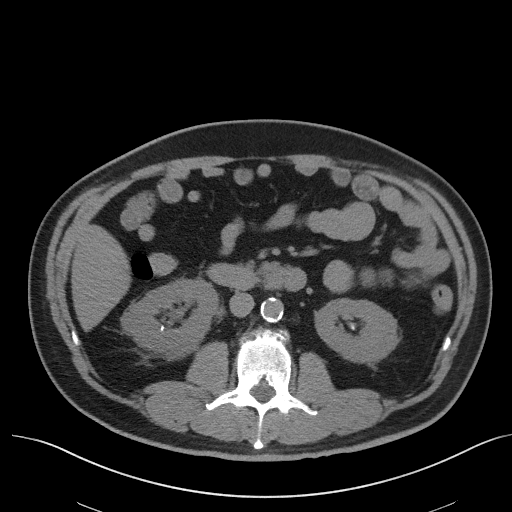
[im 74/103  soft-tissue]
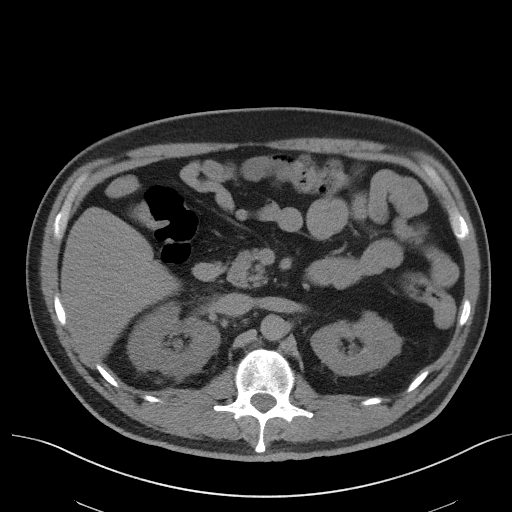
[im 74/103  bone]
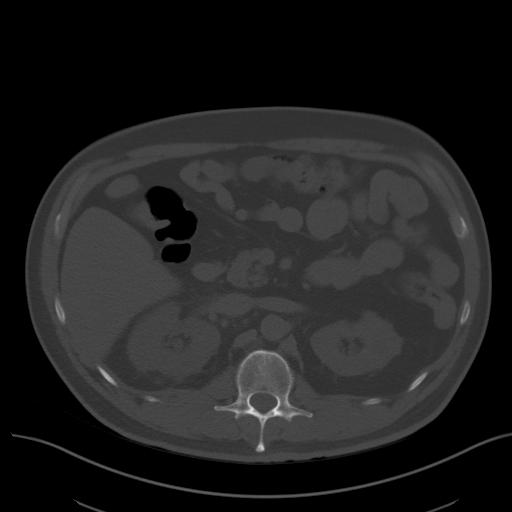
[im 82/103  soft-tissue]
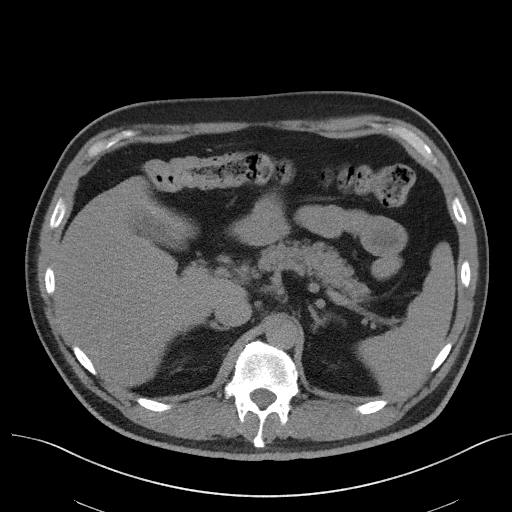
[im 90/103  soft-tissue]
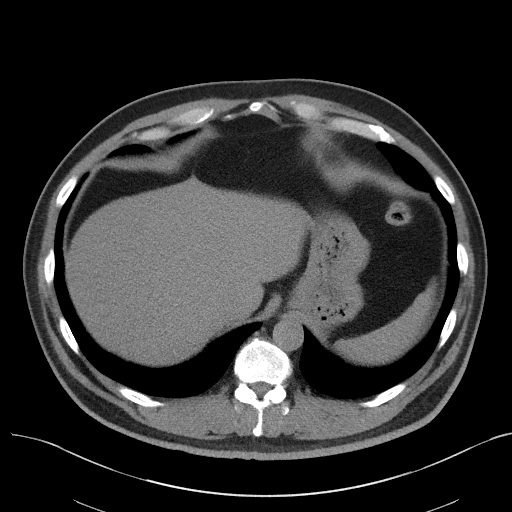
[im 98/103  soft-tissue]
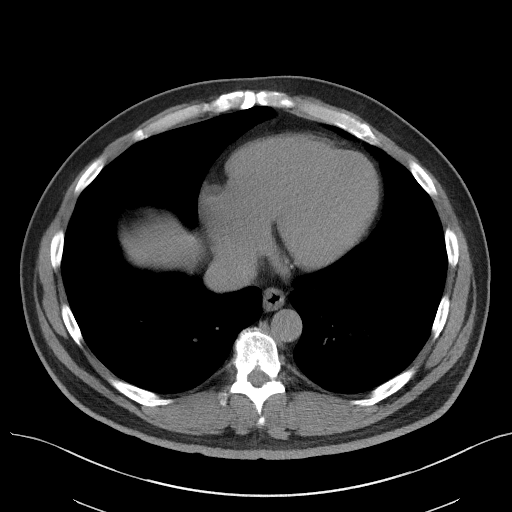

[Series 5: coronal · coronal · 0.77mm/px · 3 of 158 slices shown]
[im 53/158  soft-tissue]
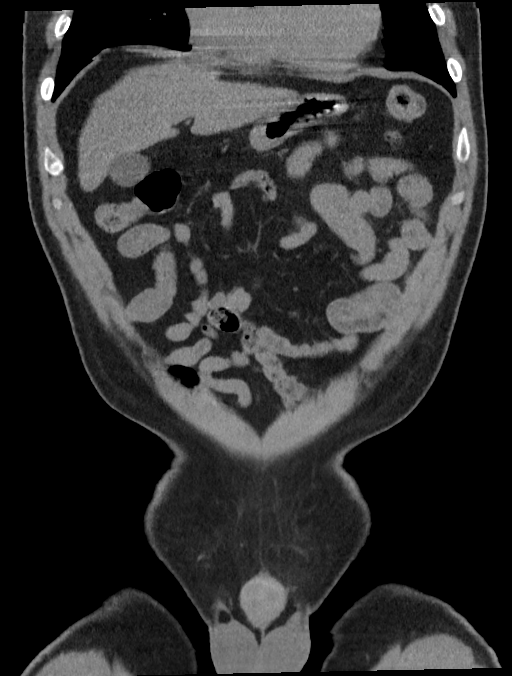
[im 70/158  soft-tissue]
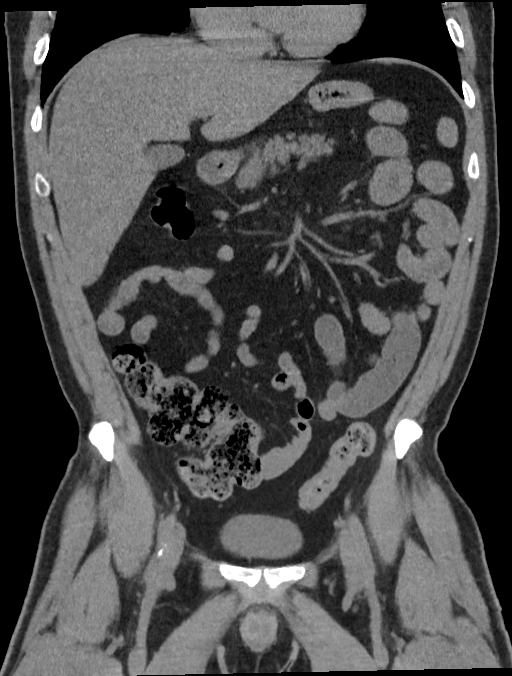
[im 88/158  soft-tissue]
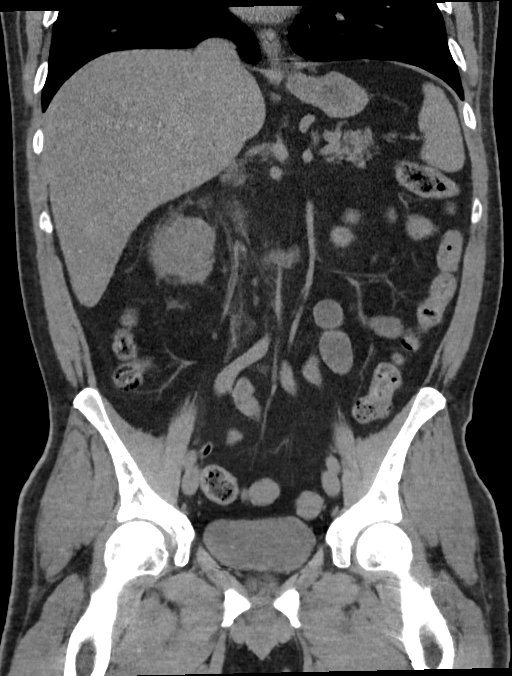

[15 of 46 positions shown; findings below may reference images not displayed]

FINDINGS: Lower chest: Lung bases are clear. There are foci of coronary artery
calcification.

Hepatobiliary: No focal liver lesions are evident on this
noncontrast enhanced study. Gallbladder wall is not appreciably
thickened. There is no biliary duct dilatation.

Pancreas: There is no pancreatic mass or inflammatory focus.

Spleen: No splenic lesions are evident.

Adrenals/Urinary Tract: Adrenals bilaterally appear normal. Right
kidney is subtly edematous. There is a cyst in the mid left kidney
measuring 1.2 x 1.0 cm, better seen on prior contrast enhanced
study. There is mild hydronephrosis on the right. There is no
appreciable hydronephrosis on the left. There is a 3 x 2 mm calculus
in the lower pole of the right kidney. There is a 4 x 3 mm calculus
in the lower pole of the left kidney. There is a calculus in the
proximal right ureter at the L3-4 level measuring 7 x 5 mm. No other
ureteral calculi are evident. Urinary bladder is midline with wall
thickness within normal limits.

Stomach/Bowel: There is no appreciable bowel wall or mesenteric
thickening. There is moderate stool in the colon. Note that most
small bowel loops are fluid-filled. There is no demonstrable bowel
obstruction. The terminal ileum appears unremarkable. There is no
evident free air or portal venous air.

Vascular/Lymphatic: There is aortic atherosclerosis. There is
calcification in the proximal right renal artery. There is also mild
calcification in the left common iliac artery. There is no evident
abdominal aortic aneurysm. No adenopathy evident in the abdomen or
pelvis.

Reproductive: There are a few prostatic calculi. Prostate and
seminal vesicles are normal in size and contour. No pelvic mass
evident.

Other: Appendix appears normal. There is no evident abscess or
ascites in the abdomen or pelvis. There is fat in each inguinal
ring.

Musculoskeletal: No blastic or lytic bone lesions. There is
degenerative change in the thoracic spine. No intramuscular lesions
are evident.
IMPRESSION: 1. 7 x 5 mm calculus in the proximal right ureter at the L3-4 level
with mild hydronephrosis and mild right renal edema.

2.  Nonobstructing calculus in the lower pole of each kidney.

3. Fluid throughout most small bowel loops, a finding that may
indicate a degree of enteritis or ileus. Ileus secondary to the
obstructing calculus on the right is quite possible. No bowel
obstruction evident.

4.  No abscess.  Appendix appears normal.

5. Aortic Atherosclerosis ([id number]). There is also calcification
in the right proximal renal artery as well as foci of coronary
artery calcification.

## 2020-12-16 ENCOUNTER — Other Ambulatory Visit: Payer: Self-pay

## 2020-12-16 ENCOUNTER — Ambulatory Visit
Admission: RE | Admit: 2020-12-16 | Discharge: 2020-12-16 | Disposition: A | Discharge status: Home / Self Care | Payer: No Typology Code available for payment source | Source: Ambulatory Visit | Attending: Emergency Medicine | Admitting: Emergency Medicine

## 2020-12-16 VITALS — BP 153/113 | HR 104 | Temp 98.4°F | Resp 18 | Ht 72.0 in | Wt 200.0 lb

## 2020-12-16 DIAGNOSIS — Z888 Allergy status to other drugs, medicaments and biological substances status: Secondary | ICD-10-CM | POA: Insufficient documentation

## 2020-12-16 DIAGNOSIS — Z7984 Long term (current) use of oral hypoglycemic drugs: Secondary | ICD-10-CM | POA: Diagnosis not present

## 2020-12-16 DIAGNOSIS — Z7982 Long term (current) use of aspirin: Secondary | ICD-10-CM | POA: Insufficient documentation

## 2020-12-16 DIAGNOSIS — R519 Headache, unspecified: Secondary | ICD-10-CM | POA: Insufficient documentation

## 2020-12-16 DIAGNOSIS — J069 Acute upper respiratory infection, unspecified: Secondary | ICD-10-CM | POA: Insufficient documentation

## 2020-12-16 DIAGNOSIS — R051 Acute cough: Secondary | ICD-10-CM | POA: Insufficient documentation

## 2020-12-16 DIAGNOSIS — I110 Hypertensive heart disease with heart failure: Secondary | ICD-10-CM | POA: Diagnosis not present

## 2020-12-16 DIAGNOSIS — Z87891 Personal history of nicotine dependence: Secondary | ICD-10-CM | POA: Insufficient documentation

## 2020-12-16 DIAGNOSIS — Z20822 Contact with and (suspected) exposure to covid-19: Secondary | ICD-10-CM | POA: Diagnosis not present

## 2020-12-16 DIAGNOSIS — Z981 Arthrodesis status: Secondary | ICD-10-CM | POA: Insufficient documentation

## 2020-12-16 DIAGNOSIS — Z79899 Other long term (current) drug therapy: Secondary | ICD-10-CM | POA: Insufficient documentation

## 2020-12-16 DIAGNOSIS — E119 Type 2 diabetes mellitus without complications: Secondary | ICD-10-CM | POA: Diagnosis not present

## 2020-12-16 LAB — RESP PANEL BY RT-PCR (FLU A&B, COVID) ARPGX2
Influenza A by PCR: NEGATIVE
Influenza B by PCR: NEGATIVE
SARS Coronavirus 2 by RT PCR: NEGATIVE

## 2020-12-16 MED ORDER — AEROCHAMBER MV MISC: 2 refills | Status: DC

## 2020-12-16 MED ORDER — ALBUTEROL SULFATE HFA 108 (90 BASE) MCG/ACT IN AERS: 2.0000 | INHALATION_SPRAY | RESPIRATORY_TRACT | 0 refills | Status: DC | PRN

## 2020-12-16 MED ORDER — PROMETHAZINE-DM 6.25-15 MG/5ML PO SYRP: 5.0000 mL | ORAL_SOLUTION | Freq: Four times a day (QID) | ORAL | 0 refills | Status: DC | PRN

## 2020-12-16 MED ORDER — BENZONATATE 100 MG PO CAPS: 200.0000 mg | ORAL_CAPSULE | Freq: Three times a day (TID) | ORAL | 0 refills | Status: DC

## 2020-12-16 NOTE — Discharge Instructions (Addendum)
Seen today did not reveal the presence of Covid or flu.  Okay to treat your cough with 2 medications.  The first medication is the [employer]  This is a small gelcap that you take with a small sip of water.  It may give you some numbness to the base of your tongue or metallic taste in her mouth, this is normal.  The second medication is promethazine DM cough syrup.  This will make you drowsy so you may want to save it for bedtime.  You can have 1 teaspoon every 6 hours.  Use the albuterol inhaler with the spacer, 2 puffs every 4-6 hours, as needed for shortness of breath and wheezing.  If your symptoms continue return for reevaluation or follow-up with your primary care provider.

## 2020-12-16 NOTE — ED Provider Notes (Signed)
[facility]    CSN: [id number] Arrival date & time: 12/16/20  1350      History   Chief Complaint Chief Complaint  Patient presents with  . Cough    HPI John Hartman is a 49 y.o. male.   HPI   49 year old male here for evaluation of runny nose, nasal discharge, cough, and shortness of breath.  Patient reports that he has had symptoms for the past 6 days.  He has had some associated mild headache with this as well.  Patient is a diabetic and reports that his blood sugars have been running a little higher than normal.  Typically he runs around 130 in the mornings and has been 160.  Patient denies fever, body aches, changes to taste or smell, ear pain or pressure, shortness of breath, wheezing, or GI complaints.  Past Medical History:  Diagnosis Date  . Angioedema   . Bronchitis   . DDD (degenerative disc disease), cervical   . Diabetes mellitus ([place])   . Fatty liver 03/31/2017   [place] April 2018  . GERD (gastroesophageal reflux disease)   . Gout   . Heart attack ([place])   . HTN (hypertension)   . Hypertension    CONTROLLED ON MEDS  . IBS (irritable bowel syndrome)   . Kidney stone   . LVH (left ventricular hypertrophy) 11/24/2018   Moderate, ECHO  . Myocardial infarction ([place]) 08/23/2018  . Seasonal allergies   . Spinal stenosis     Patient Active Problem List   Diagnosis Date Noted  . Perinephric hematoma 10/29/2020  . Back muscle spasm 10/28/2020  . Chronic pain syndrome 04/30/2020  . Lumbar radiculopathy 04/30/2020  . Spinal stenosis of lumbar region with neurogenic claudication 04/30/2020  . S/P cervical spinal fusion 04/30/2020  . HFrEF (heart failure with reduced ejection fraction) ([place]) 02/18/2020  . Nephrolithiasis 02/18/2020  . OSA (obstructive sleep apnea) 02/17/2020  . Right ureteral stone 02/07/2020  . Diabetes mellitus without complication ([place]) [id number]  . Chronic systolic CHF (congestive heart failure) ([place]) 02/07/2020  . Unspecified  inflammatory spondylopathy, 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[place]) 01/06/2020  . Arthritis of right acromioclavicular joint 09/23/2019  . Incomplete tear of right rotator cuff 09/23/2019  . 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 H/O non-ST elevation myocardial infarction (NSTEMI) 11/24/2018  . LVH (left ventricular hypertrophy) 11/24/2018  . 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 History of angioedema 09/01/2018  . NSTEMI (non-ST elevated myocardial infarction) ([place]) 08/22/2018  . Chronic lower back pain 08/04/2017  . Degenerative disc disease, lumbar 08/04/2017  . Lumbar facet arthropathy 08/04/2017  . Calcification of abdominal aorta (HCC) 08/04/2017  . NAFL (nonalcoholic fatty liver) [id number]  . Hyperlipidemia LDL goal <70 01/04/2017  . Elevated serum glutamic pyruvic transaminase (SGPT) level 01/04/2017  . Coronary artery disease 08/25/2016  . Ketonuria 07/12/2016  . Glucosuria 07/12/2016  . 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 Family history of malignant neoplasm of gastrointestinal tract   . Benign neoplasm of rectosigmoid junction   . Essential hypertension 05/28/2015  . Gout 05/28/2015  . IBS (irritable bowel syndrome) 05/28/2015    Past Surgical History:  Procedure Laterality Date  . ACDF with fusion  2007  . CARDIAC CATHETERIZATION Left 08/15/2016   Procedure: Left Heart Cath and Coronary Angiography;  Surgeon: Wellington Hampshire, MD;  Location: [place] [facility];  Service: Cardiovascular;  Laterality: Left;  . COLONOSCOPY WITH PROPOFOL N/A 06/26/2015   Procedure: COLONOSCOPY WITH PROPOFOL;  Surgeon: Lucilla Lame, MD;  Location: [place];  Service: Endoscopy;  Laterality: N/A;  with biopsies  . CORONARY/GRAFT ACUTE MI REVASCULARIZATION N/A 08/23/2018   Procedure: Coronary/Graft Acute MI Revascularization;  Surgeon: Isaias Cowman, MD;  Location: [place] [facility];  Service: Cardiovascular;  Laterality: N/A;  . CYSTOSCOPY W/ RETROGRADES  02/10/2020   Procedure: CYSTOSCOPY WITH  RETROGRADE PYELOGRAM;  Surgeon: Billey Co, MD;  Location: [facility];  Service: Urology;;  . Consuela Mimes WITH BIOPSY N/A 11/22/2019   Procedure: CYSTOSCOPY WITH Bladder BIOPSY & Almyra Free;  Surgeon: Billey Co, MD;  Location: [facility];  Service: Urology;  Laterality: N/A;  . CYSTOSCOPY/URETEROSCOPY/HOLMIUM LASER  02/10/2020   Procedure: CYSTOSCOPY/URETEROSCOPY/HOLMIUM LASER;  Surgeon: Billey Co, MD;  Location: [facility];  Service: Urology;;  . CYSTOSCOPY/URETEROSCOPY/HOLMIUM LASER/STENT PLACEMENT Left 11/22/2019   Procedure: CYSTOSCOPY/URETEROSCOPY/BILATERAL RETROGRADE PYELOGRAM/HOLMIUM LASER/STENT PLACEMENT;  Surgeon: Billey Co, MD;  Location: [facility];  Service: Urology;  Laterality: Left;  . EXTRACORPOREAL SHOCK WAVE LITHOTRIPSY Left 10/29/2020   Procedure: EXTRACORPOREAL SHOCK WAVE LITHOTRIPSY (ESWL);  Surgeon: Hollice Espy, MD;  Location: [facility];  Service: Urology;  Laterality: Left;  . LEFT HEART CATH AND CORONARY ANGIOGRAPHY N/A 08/23/2018   Procedure: LEFT HEART CATH AND CORONARY ANGIOGRAPHY;  Surgeon: Isaias Cowman, MD;  Location: [place] [facility];  Service: Cardiovascular;  Laterality: N/A;  . LEFT HEART CATH AND CORONARY ANGIOGRAPHY N/A 02/10/2020   Procedure: LEFT HEART CATH AND CORONARY ANGIOGRAPHY;  Surgeon: Corey Skains, MD;  Location: [facility];  Service: Cardiovascular;  Laterality: N/A;  . NASAL SEPTUM SURGERY  2004  . SHOULDER ARTHROSCOPY W/ ROTATOR CUFF REPAIR  10/01/2019  . SHOULDER SURGERY Left 2007  . Testicular torsion  1980s  . VASECTOMY         Home Medications    Prior to Admission medications   Medication Sig Start Date End Date Taking? Authorizing Provider  allopurinol (ZYLOPRIM) 100 MG tablet Take 100 mg by mouth daily.    Yes [provider]  amLODipine (NORVASC) 10 MG tablet Take 1 tablet (10 mg total) by mouth daily. 02/13/20  Yes Nicole Kindred A, DO  aspirin EC 81 MG tablet Take 81 mg by  mouth daily.   Yes [provider]  atorvastatin (LIPITOR) 80 MG tablet Take 1 tablet (80 mg total) by mouth daily at 6 PM. Patient taking differently: Take 80 mg by mouth daily with supper. 08/25/18 11/19/20 Yes Pyreddy, Reatha Harps, MD  cetirizine (ZYRTEC) 10 MG tablet Take 10 mg by mouth at bedtime.   Yes [provider]  Dapagliflozin-metFORMIN HCl ER (XIGDUO XR) 09-999 MG TB24 Take by mouth.   Yes [provider]  diphenhydrAMINE (BENADRYL) 25 MG tablet Take 50 mg by mouth at bedtime.   Yes [provider]  ezetimibe (ZETIA) 10 MG tablet Take 1 tablet (10 mg total) by mouth daily. 12/03/18  Yes Lada, Satira Anis, MD  famotidine (PEPCID) 10 MG tablet Take 10 mg by mouth daily.   Yes [provider]  hydrALAZINE (APRESOLINE) 25 MG tablet Take 1 tablet (25 mg total) by mouth 2 (two) times daily. 08/25/18 11/19/20 Yes Pyreddy, Reatha Harps, MD  metoprolol tartrate (LOPRESSOR) 25 MG tablet Take 2 tablets (50 mg total) by mouth 2 (two) times daily. 11/01/20 12/01/20 Yes Dessa Phi, DO  nitroGLYCERIN (NITROSTAT) 0.4 MG SL tablet Place 1 tablet (0.4 mg total) under the tongue every 5 (five) minutes as needed for chest pain. 08/25/18  Yes Saundra Shelling, MD  ONETOUCH ULTRA test strip SMARTSIG:Via Meter 08/19/20  Yes  [provider]  pregabalin (LYRICA) 50 MG capsule Take 1 capsule (50 mg total) by mouth 3 (three) times daily. Patient taking differently: Take 50 mg by mouth 2 (two) times daily. 1 capsule in the morning and 2 capsules at night. 04/30/20  Yes Lateef, Carlus Pavlov, MD  RYBELSUS 3 MG TABS Take 3 mg by mouth daily at 6 (six) AM.    Yes [provider]  tiZANidine (ZANAFLEX) 4 MG tablet Take 1 tablet (4 mg total) by mouth every 8 (eight) hours as needed for muscle spasms. 11/10/20 02/08/21 Yes Gillis Santa, MD  vitamin B-12 (CYANOCOBALAMIN) 1000 MCG tablet Take 1,000 mcg by mouth daily.    Yes [provider]  albuterol (VENTOLIN HFA) 108 (90 Base)  MCG/ACT inhaler Inhale 2 puffs into the lungs every 4 (four) hours as needed for wheezing or shortness of breath. 12/16/20   Margarette Canada, NP  benzonatate (TESSALON) 100 MG capsule Take 2 capsules (200 mg total) by mouth every 8 (eight) hours. 12/16/20   Margarette Canada, NP  promethazine-dextromethorphan (PROMETHAZINE-DM) 6.25-15 MG/5ML syrup Take 5 mLs by mouth 4 (four) times daily as needed. 12/16/20   Margarette Canada, NP  Spacer/Aero-Holding Chambers (AEROCHAMBER MV) inhaler Use as instructed 12/16/20   Margarette Canada, NP  metFORMIN (GLUCOPHAGE) 1000 MG tablet Take 1 tablet (1,000 mg total) by mouth 2 (two) times daily with a meal. 11/01/20 12/16/20  Dessa Phi, DO    Family History Family History  Problem Relation Age of Onset  . Breast cancer Mother   . Skin cancer Mother   . Arrhythmia Mother        A-fib  . Cancer Mother        breast, colon?, skin  . Hypertension Father   . Diabetes Father   . Cancer Maternal Grandfather   . Heart disease Brother        stent  . Allergies Son   . Alzheimer's disease Maternal Grandmother   . Stroke Neg Hx   . COPD Neg Hx     Social History Social History   Tobacco Use  . Smoking status: Former Smoker    Packs/day: 1.00    Years: 10.00    Pack years: 10.00    Types: Cigarettes    Quit date: 04/15/2003    Years since quitting: 17.6  . Smokeless tobacco: Never Used  Vaping Use  . Vaping Use: Never used  Substance Use Topics  . Alcohol use: No    Alcohol/week: 0.0 standard drinks  . Drug use: No     Allergies   Imdur [isosorbide nitrate], Lisinopril, Other, Drug [tape], Tapentadol, and Ace inhibitors   Review of Systems Review of Systems  Constitutional: Negative for activity change, appetite change and fever.  HENT: Positive for congestion and rhinorrhea. Negative for ear pain, sinus pressure, sinus pain and sore throat.   Respiratory: Positive for cough. Negative for shortness of breath and wheezing.   Cardiovascular: Negative  for chest pain.  Gastrointestinal: Negative for diarrhea, nausea and vomiting.  Musculoskeletal: Negative for arthralgias and myalgias.  Skin: Negative for rash.  Neurological: Positive for headaches.  Hematological: Negative.   Psychiatric/Behavioral: Negative.      Physical Exam Triage Vital Signs ED Triage Vitals  Enc Vitals Group     BP 12/16/20 1401 (!) 153/113     Pulse Rate 12/16/20 1401 (!) 104     Resp 12/16/20 1401 18     Temp 12/16/20 1401 98.4 F (36.9 C)  Temp Source 12/16/20 1401 Oral     SpO2 12/16/20 1401 99 %     Weight 12/16/20 1359 200 lb (90.7 kg)     Height 12/16/20 1359 6' (1.829 m)     Head Circumference --      Peak Flow --      Pain Score 12/16/20 1358 8     Pain Loc --      Pain Edu? --      Excl. in [place]? --    No data found.  Updated Vital Signs BP (!) 153/113 (BP Location: Right Arm)   Pulse (!) 104   Temp 98.4 F (36.9 C) (Oral)   Resp 18   Ht 6' (1.829 m)   Wt 200 lb (90.7 kg)   SpO2 99%   BMI 27.12 kg/m   Visual Acuity Right Eye Distance:   Left Eye Distance:   Bilateral Distance:    Right Eye Near:   Left Eye Near:    Bilateral Near:     Physical Exam Vitals and nursing note reviewed.  Constitutional:      General: He is not in acute distress.    Appearance: Normal appearance. He is normal weight. He is not toxic-appearing.  HENT:     Head: Normocephalic.     Right Ear: Tympanic membrane, ear canal and external ear normal.     Left Ear: Tympanic membrane, ear canal and external ear normal.     Nose: Congestion and rhinorrhea present.     Comments: Nasal mucosa is mildly edematous and erythematous with clear nasal discharge.    Mouth/Throat:     Mouth: Mucous membranes are moist.     Pharynx: Oropharynx is clear. Posterior oropharyngeal erythema present. No oropharyngeal exudate.     Comments: Posterior oropharynx has mild erythema and clear postnasal drip. Cardiovascular:     Rate and Rhythm: Normal rate and  regular rhythm.     Pulses: Normal pulses.     Heart sounds: Normal heart sounds. No murmur heard. No gallop.   Pulmonary:     Effort: Pulmonary effort is normal.     Breath sounds: Normal breath sounds. No wheezing, rhonchi or rales.  Musculoskeletal:     Cervical back: Normal range of motion and neck supple.  Lymphadenopathy:     Cervical: No cervical adenopathy.  Skin:    General: Skin is warm and dry.     Capillary Refill: Capillary refill takes less than 2 seconds.     Findings: No erythema or rash.  Neurological:     General: No focal deficit present.     Mental Status: He is alert and oriented to person, place, and time.  Psychiatric:        Mood and Affect: Mood normal.        Behavior: Behavior normal.        Thought Content: Thought content normal.        Judgment: Judgment normal.      UC Treatments / Results  Labs (all labs ordered are listed, but only abnormal results are displayed) Labs Reviewed  RESP PANEL BY RT-PCR (FLU A&B, COVID) [id number]    EKG   Radiology No results found.  Procedures Procedures (including critical care time)  Medications Ordered in UC Medications - No data to display  Initial Impression / Assessment and Plan / UC Course  I have reviewed the triage vital signs and the nursing notes.  Pertinent labs & imaging results that were available during my  care of the patient were reviewed by me and considered in my medical decision making (see chart for details).   Patient here for evaluation of cold symptoms that have been present for the past 6 days.  Patient states that he vacillates between nasal congestion and a runny nose.  He has been coughing without sputum production.  Patient reports that he coughs so much that now it hurts when he coughs.  Patient does have any increased work of breathing.  Physical exam reveals erythematous and edematous nasal mucosa with clear discharge.  Lung sounds are clear to auscultation in all fields.   Patient has not been vaccinated against Covid or the flu.  Will send respiratory triplex panel.  Respiratory panel is negative for Covid or flu.  We will discharge patient home with a diagnosis of viral URI with cough.  Will give Promethazine DM and Tessalon Perles to help with cough.  Scribe albuterol inhaler with a spacer for shortness of breath.  Discussed patient's blood pressure with him.  His past trends have had numbers close to his current levels.  Patient reports that he is taking his medication daily but that when he gets sick his blood pressure goes up.  It may also be up secondary to his coughing.  Final Clinical Impressions(s) / UC Diagnoses   Final diagnoses:  Viral URI with cough     Discharge Instructions     Seen today did not reveal the presence of Covid or flu.  Okay to treat your cough with 2 medications.  The first medication is the [employer]  This is a small gelcap that you take with a small sip of water.  It may give you some numbness to the base of your tongue or metallic taste in her mouth, this is normal.  The second medication is promethazine DM cough syrup.  This will make you drowsy so you may want to save it for bedtime.  You can have 1 teaspoon every 6 hours.  Use the albuterol inhaler with the spacer, 2 puffs every 4-6 hours, as needed for shortness of breath and wheezing.  If your symptoms continue return for reevaluation or follow-up with your primary care provider.    ED Prescriptions    Medication Sig Dispense Auth. Provider   albuterol (VENTOLIN HFA) 108 (90 Base) MCG/ACT inhaler Inhale 2 puffs into the lungs every 4 (four) hours as needed for wheezing or shortness of breath. 18 g Margarette Canada, NP   Spacer/Aero-Holding Chambers (AEROCHAMBER MV) inhaler Use as instructed 1 each Margarette Canada, NP   benzonatate (TESSALON) 100 MG capsule Take 2 capsules (200 mg total) by mouth every 8 (eight) hours. 21 capsule Margarette Canada, NP    promethazine-dextromethorphan (PROMETHAZINE-DM) 6.25-15 MG/5ML syrup Take 5 mLs by mouth 4 (four) times daily as needed. 118 mL Margarette Canada, NP     PDMP not reviewed this encounter.   Margarette Canada, NP 12/16/20 1452

## 2020-12-16 NOTE — ED Triage Notes (Signed)
Patient states that he has been having a cough, congestion, runny nose and drainage with shortness of breath. Patient states that when he coughs now he has pain in his right ribs and chest. States that he did a at home covid test (negative) yesterday. Patient states that all of these symptoms started on Thursday.

## 2020-12-22 ENCOUNTER — Ambulatory Visit: Payer: Managed Care, Other (non HMO) | Admitting: Urology

## 2021-01-11 ENCOUNTER — Encounter: Payer: Self-pay | Admitting: Student in an Organized Health Care Education/Training Program

## 2021-01-11 DIAGNOSIS — M5416 Radiculopathy, lumbar region: Secondary | ICD-10-CM

## 2021-01-11 DIAGNOSIS — G894 Chronic pain syndrome: Secondary | ICD-10-CM

## 2021-01-11 DIAGNOSIS — M47816 Spondylosis without myelopathy or radiculopathy, lumbar region: Secondary | ICD-10-CM

## 2021-01-12 MED ORDER — PREGABALIN 50 MG PO CAPS: ORAL_CAPSULE | ORAL | 5 refills | Status: DC

## 2021-02-02 ENCOUNTER — Other Ambulatory Visit: Payer: Self-pay | Admitting: Student in an Organized Health Care Education/Training Program

## 2021-03-18 ENCOUNTER — Ambulatory Visit
Admission: EM | Admit: 2021-03-18 | Discharge: 2021-03-18 | Disposition: A | Discharge status: Home / Self Care | Payer: No Typology Code available for payment source | Attending: Sports Medicine | Admitting: Sports Medicine

## 2021-03-18 ENCOUNTER — Ambulatory Visit (INDEPENDENT_AMBULATORY_CARE_PROVIDER_SITE_OTHER)
Admit: 2021-03-18 | Discharge: 2021-03-18 | Disposition: A | Discharge status: Home / Self Care | Payer: No Typology Code available for payment source | Attending: Sports Medicine | Admitting: Sports Medicine

## 2021-03-18 ENCOUNTER — Other Ambulatory Visit: Payer: Self-pay

## 2021-03-18 ENCOUNTER — Encounter: Payer: Self-pay | Admitting: Emergency Medicine

## 2021-03-18 DIAGNOSIS — H538 Other visual disturbances: Secondary | ICD-10-CM | POA: Diagnosis not present

## 2021-03-18 DIAGNOSIS — R110 Nausea: Secondary | ICD-10-CM

## 2021-03-18 DIAGNOSIS — R519 Headache, unspecified: Secondary | ICD-10-CM

## 2021-03-18 DIAGNOSIS — G43919 Migraine, unspecified, intractable, without status migrainosus: Secondary | ICD-10-CM | POA: Diagnosis not present

## 2021-03-18 DIAGNOSIS — G4485 Primary stabbing headache: Secondary | ICD-10-CM | POA: Diagnosis not present

## 2021-03-18 DIAGNOSIS — H53141 Visual discomfort, right eye: Secondary | ICD-10-CM

## 2021-03-18 MED ORDER — INDOMETHACIN 25 MG PO CAPS: 25.0000 mg | ORAL_CAPSULE | Freq: Three times a day (TID) | ORAL | 0 refills | Status: DC | PRN

## 2021-03-18 MED ORDER — ONDANSETRON HCL 4 MG PO TABS: 4.0000 mg | ORAL_TABLET | Freq: Four times a day (QID) | ORAL | 0 refills | Status: DC

## 2021-03-18 NOTE — ED Triage Notes (Signed)
Called [employer] at [phone] to pre-cert CT Head w/o contrast (CPT [phone]). Spoke with Frances Nickels, approved, auth# [id number] valid 03/18/21 to 09/14/21.

## 2021-03-18 NOTE — Discharge Instructions (Addendum)
Your CT scan did not show any lesions, masses, or bleeds.  It was read as normal. Given the fact that you have had similar headaches in the past, although not as severe as this, I am going to start you on a medicine called indomethacin.  I also gave you Zofran for your nausea. I want you to call your primary care provider and be seen tomorrow for recheck.  You may benefit from a migraine headache medication, but someone needs to follow you closely while you are on this. I provided you a work note just keeping about today and tomorrow. Educational handouts provided. While you take the indomethacin, please do not take any Motrin, Advil, ibuprofen, Naprosyn, Aleve, or any aspirin products.  No anti-inflammatory medicines.  You can take Tylenol only if he needs something in addition to what I prescribed.  If your headache gets really bad I need you to call 911 or have someone take you to the nearest emergency room for a higher level of care and further evaluation.  I hope you get the feeling better, and please follow-up with your primary care provider tomorrow. Dr. Drema Dallas

## 2021-03-18 NOTE — ED Triage Notes (Addendum)
Pt c/o headache. He states he was woken up early this morning with a headache. He states the pain is around his right eye. No h/o headache. He states it feels like he black out in right eye when a sharp pain hit, it has sharp pains regularly since this started.

## 2021-03-23 NOTE — ED Provider Notes (Signed)
[facility]    CSN: [id number] Arrival date & time: 03/18/21  1017      History   Chief Complaint Chief Complaint  Patient presents with  . Headache    HPI John Hartman is a 50 y.o. male.   Patient is a pleasant 49 year old male who presents for evaluation of the above issue.  Normally sees [facility] for his ongoing medical care but was unable to be seen today.  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  He said that he woke up from sleep at 3 AM this morning with a right-sided headache over his right eye.  He said at times it comes in waves.  He is reporting some vision changes.  There is associated nausea but no vomiting.  He has some mild photophobia.  He does not suffer from migraines.  He does not report an aura.  He has had some mild versions of this in the past but this is the most severe.  He denies any ear pain or sore throat.  No chest pain or shortness of breath.  He did have a heart attack in August 2019 and has a significant vascular history.  He had one stent placed for almost complete occlusion of the LAD.  He is only on aspirin for this.  He sees cardiology over at [facility].  He also has type 2 diabetes and is followed by endocrinology as well.     Past Medical History:  Diagnosis Date  . Angioedema   . Bronchitis   . DDD (degenerative disc disease), cervical   . Diabetes mellitus ([place])   . Fatty liver 03/31/2017   [place] April 2018  . GERD (gastroesophageal reflux disease)   . Gout   . Heart attack ([place])   . HTN (hypertension)   . Hypertension    CONTROLLED ON MEDS  . IBS (irritable bowel syndrome)   . Kidney stone   . LVH (left ventricular hypertrophy) 11/24/2018   Moderate, ECHO  . Myocardial infarction ([place]) 08/23/2018  . Seasonal allergies   . Spinal stenosis     Patient Active Problem List   Diagnosis Date Noted  . Perinephric hematoma 10/29/2020  . Back muscle spasm 10/28/2020  . Chronic pain syndrome 04/30/2020  . Lumbar  radiculopathy 04/30/2020  . Spinal stenosis of lumbar region with neurogenic claudication 04/30/2020  . S/P cervical spinal fusion 04/30/2020  . HFrEF (heart failure with reduced ejection fraction) ([place]) 02/18/2020  . Nephrolithiasis 02/18/2020  . OSA (obstructive sleep apnea) 02/17/2020  . Right ureteral stone 02/07/2020  . Diabetes mellitus without complication ([place]) 50/02/7047  . Chronic systolic CHF (congestive heart failure) ([place]) 02/07/2020  . 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 Arthritis of right acromioclavicular joint 09/23/2019  . Incomplete tear of right rotator cuff 09/23/2019  . 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 H/O non-ST elevation myocardial infarction (NSTEMI) 11/24/2018  . LVH (left ventricular hypertrophy) 11/24/2018  . 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 History of angioedema 09/01/2018  . NSTEMI (non-ST elevated myocardial infarction) ([place]) 08/22/2018  . Chronic lower back pain 08/04/2017  . Degenerative disc disease, lumbar 08/04/2017  . Lumbar facet arthropathy 08/04/2017  . Calcification of abdominal aorta (HCC) 08/04/2017  . NAFL (nonalcoholic fatty liver) 88/91/6945  . Hyperlipidemia LDL goal <70 01/04/2017  . Elevated serum glutamic pyruvic transaminase (SGPT) level 01/04/2017  . Coronary artery disease 08/25/2016  . Ketonuria 07/12/2016  . Glucosuria 07/12/2016  . 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 Family history  of malignant neoplasm of gastrointestinal tract   . Benign neoplasm of rectosigmoid junction   . Essential hypertension 05/28/2015  . Gout 05/28/2015  . IBS (irritable bowel syndrome) 05/28/2015    Past Surgical History:  Procedure Laterality Date  . ACDF with fusion  2007  . CARDIAC CATHETERIZATION Left 08/15/2016   Procedure: Left Heart Cath and Coronary Angiography;  Surgeon: Wellington Hampshire, MD;  Location: [facility];  Service: Cardiovascular;  Laterality: Left;  . COLONOSCOPY WITH  PROPOFOL N/A 06/26/2015   Procedure: COLONOSCOPY WITH PROPOFOL;  Surgeon: Lucilla Lame, MD;  Location: [facility];  Service: Endoscopy;  Laterality: N/A;  with biopsies  . CORONARY/GRAFT ACUTE MI REVASCULARIZATION N/A 08/23/2018   Procedure: Coronary/Graft Acute MI Revascularization;  Surgeon: Isaias Cowman, MD;  Location: [facility];  Service: Cardiovascular;  Laterality: N/A;  . CYSTOSCOPY W/ RETROGRADES  02/10/2020   Procedure: CYSTOSCOPY WITH RETROGRADE PYELOGRAM;  Surgeon: Billey Co, MD;  Location: [facility];  Service: Urology;;  . Consuela Mimes WITH BIOPSY N/A 11/22/2019   Procedure: CYSTOSCOPY WITH Bladder BIOPSY & Almyra Free;  Surgeon: Billey Co, MD;  Location: [facility];  Service: Urology;  Laterality: N/A;  . CYSTOSCOPY/URETEROSCOPY/HOLMIUM LASER  02/10/2020   Procedure: CYSTOSCOPY/URETEROSCOPY/HOLMIUM LASER;  Surgeon: Billey Co, MD;  Location: [facility];  Service: Urology;;  . CYSTOSCOPY/URETEROSCOPY/HOLMIUM LASER/STENT PLACEMENT Left 11/22/2019   Procedure: CYSTOSCOPY/URETEROSCOPY/BILATERAL RETROGRADE PYELOGRAM/HOLMIUM LASER/STENT PLACEMENT;  Surgeon: Billey Co, MD;  Location: [facility];  Service: Urology;  Laterality: Left;  . EXTRACORPOREAL SHOCK WAVE LITHOTRIPSY Left 10/29/2020   Procedure: EXTRACORPOREAL SHOCK WAVE LITHOTRIPSY (ESWL);  Surgeon: Hollice Espy, MD;  Location: [facility];  Service: Urology;  Laterality: Left;  . LEFT HEART CATH AND CORONARY ANGIOGRAPHY N/A 08/23/2018   Procedure: LEFT HEART CATH AND CORONARY ANGIOGRAPHY;  Surgeon: Isaias Cowman, MD;  Location: [facility];  Service: Cardiovascular;  Laterality: N/A;  . LEFT HEART CATH AND CORONARY ANGIOGRAPHY N/A 02/10/2020   Procedure: LEFT HEART CATH AND CORONARY ANGIOGRAPHY;  Surgeon: Corey Skains, MD;  Location: [facility];  Service: Cardiovascular;  Laterality: N/A;  . NASAL SEPTUM SURGERY  2004  . SHOULDER ARTHROSCOPY W/ ROTATOR CUFF REPAIR   10/01/2019  . SHOULDER SURGERY Left 2007  . Testicular torsion  1980s  . VASECTOMY         Home Medications    Prior to Admission medications   Medication Sig Start Date End Date Taking? Authorizing Provider  albuterol (VENTOLIN HFA) 108 (90 Base) MCG/ACT inhaler Inhale 2 puffs into the lungs every 4 (four) hours as needed for wheezing or shortness of breath. 12/16/20  Yes Margarette Canada, NP  allopurinol (ZYLOPRIM) 100 MG tablet Take 100 mg by mouth daily.    Yes [provider]  amLODipine (NORVASC) 10 MG tablet Take 1 tablet (10 mg total) by mouth daily. 02/13/20  Yes Nicole Kindred A, DO  aspirin EC 81 MG tablet Take 81 mg by mouth daily.   Yes [provider]  atorvastatin (LIPITOR) 80 MG tablet Take 1 tablet (80 mg total) by mouth daily at 6 PM. Patient taking differently: Take 80 mg by mouth daily with supper. 08/25/18 11/19/20 Yes Pyreddy, Reatha Harps, MD  cetirizine (ZYRTEC) 10 MG tablet Take 10 mg by mouth at bedtime.   Yes [provider]  Dapagliflozin-metFORMIN HCl ER (XIGDUO XR) 09-999 MG TB24 Take by mouth.   Yes [provider]  diphenhydrAMINE (BENADRYL) 25 MG tablet Take 50 mg by mouth  at bedtime.   Yes [provider]  ezetimibe (ZETIA) 10 MG tablet Take 1 tablet (10 mg total) by mouth daily. 12/03/18  Yes Lada, Satira Anis, MD  famotidine (PEPCID) 10 MG tablet Take 10 mg by mouth daily.   Yes [provider]  hydrALAZINE (APRESOLINE) 25 MG tablet Take 1 tablet (25 mg total) by mouth 2 (two) times daily. 08/25/18 11/19/20 Yes Pyreddy, Reatha Harps, MD  indomethacin (INDOCIN) 25 MG capsule Take 1 capsule (25 mg total) by mouth 3 (three) times daily as needed. 03/18/21  Yes Verda Cumins, MD  metoprolol tartrate (LOPRESSOR) 25 MG tablet Take 2 tablets (50 mg total) by mouth 2 (two) times daily. 11/01/20 12/01/20 Yes Dessa Phi, DO  nitroGLYCERIN (NITROSTAT) 0.4 MG SL tablet Place 1 tablet (0.4 mg total) under the tongue every 5 (five)  minutes as needed for chest pain. 08/25/18  Yes Pyreddy, Reatha Harps, MD  ondansetron (ZOFRAN) 4 MG tablet Take 1 tablet (4 mg total) by mouth every 6 (six) hours. 03/18/21  Yes Verda Cumins, MD  [facility] ULTRA test strip SMARTSIG:Via Meter 08/19/20  Yes [provider]  pregabalin (LYRICA) 50 MG capsule 1 capsule in the morning and 2 capsules at night. 01/12/21  Yes Gillis Santa, MD  tiZANidine (ZANAFLEX) 4 MG capsule Take 4 mg by mouth 3 (three) times daily.   Yes [provider]  vitamin B-12 (CYANOCOBALAMIN) 1000 MCG tablet Take 1,000 mcg by mouth daily.    Yes [provider]  benzonatate (TESSALON) 100 MG capsule Take 2 capsules (200 mg total) by mouth every 8 (eight) hours. 12/16/20   Margarette Canada, NP  promethazine-dextromethorphan (PROMETHAZINE-DM) 6.25-15 MG/5ML syrup Take 5 mLs by mouth 4 (four) times daily as needed. 12/16/20   Margarette Canada, NP  RYBELSUS 3 MG TABS Take 3 mg by mouth daily at 6 (six) AM.     [provider]  Spacer/Aero-Holding Chambers (AEROCHAMBER MV) inhaler Use as instructed 12/16/20   Margarette Canada, NP  metFORMIN (GLUCOPHAGE) 1000 MG tablet Take 1 tablet (1,000 mg total) by mouth 2 (two) times daily with a meal. 11/01/20 12/16/20  Dessa Phi, DO    Family History Family History  Problem Relation Age of Onset  . Breast cancer Mother   . Skin cancer Mother   . Arrhythmia Mother        A-fib  . Cancer Mother        breast, colon?, skin  . Hypertension Father   . Diabetes Father   . Cancer Maternal Grandfather   . Heart disease Brother        stent  . Allergies Son   . Alzheimer's disease Maternal Grandmother   . Stroke Neg Hx   . COPD Neg Hx     Social History Social History   Tobacco Use  . Smoking status: Former Smoker    Packs/day: 1.00    Years: 10.00    Pack years: 10.00    Types: Cigarettes    Quit date: 04/15/2003    Years since quitting: 17.9  . Smokeless tobacco: Never Used  Vaping Use  . Vaping Use:  Never used  Substance Use Topics  . Alcohol use: No    Alcohol/week: 0.0 standard drinks  . Drug use: No     Allergies   Imdur [isosorbide nitrate], Lisinopril, Other, Drug [tape], Tapentadol, and Ace inhibitors   Review of Systems Review of Systems  Constitutional: Negative for activity change, appetite change, chills, diaphoresis, fatigue and fever.  HENT: Positive  for sinus pain.   Eyes: Negative.   Respiratory: Negative.  Negative for cough, chest tightness, shortness of breath, wheezing and stridor.   Cardiovascular: Negative.  Negative for chest pain and palpitations.  Gastrointestinal: Positive for nausea. Negative for abdominal pain, constipation, diarrhea and vomiting.  Genitourinary: Negative for flank pain.  Neurological: Positive for headaches. Negative for dizziness, tremors, seizures, syncope, facial asymmetry, speech difficulty, weakness, light-headedness and numbness.  All other systems reviewed and are negative.    Physical Exam Triage Vital Signs ED Triage Vitals  Enc Vitals Group     BP 03/18/21 1109 (!) 141/118     Pulse Rate 03/18/21 1109 68     Resp 03/18/21 1109 18     Temp 03/18/21 1109 98.3 F (36.8 C)     Temp Source 03/18/21 1109 Oral     SpO2 03/18/21 1109 100 %     Weight 03/18/21 1105 200 lb 1.8 oz (90.8 kg)     Height 03/18/21 1105 6' (1.829 m)     Head Circumference --      Peak Flow --      Pain Score 03/18/21 1105 9     Pain Loc --      Pain Edu? --      Excl. in [place]? --    No data found.  Updated Vital Signs BP (!) 141/118 (BP Location: Right Arm)   Pulse 68   Temp 98.3 F (36.8 C) (Oral)   Resp 18   Ht 6' (1.829 m)   Wt 90.8 kg   SpO2 100%   BMI 27.14 kg/m   Visual Acuity Right Eye Distance:   Left Eye Distance:   Bilateral Distance:    Right Eye Near:   Left Eye Near:    Bilateral Near:     Physical Exam Vitals and nursing note reviewed.  Constitutional:      General: He is not in acute distress.     Appearance: He is well-developed. He is not ill-appearing, toxic-appearing or diaphoretic.     Comments: He is uncomfortable appearing.  Eyes:     General: No visual field deficit or scleral icterus.    Extraocular Movements: Extraocular movements intact.     Right eye: No nystagmus.     Left eye: No nystagmus.     Pupils: Pupils are equal, round, and reactive to light. Pupils are equal.  Neck:     Meningeal: Brudzinski's sign and Kernig's sign absent.  Cardiovascular:     Rate and Rhythm: Normal rate.     Heart sounds: Normal heart sounds. No murmur heard. No friction rub. No gallop.   Pulmonary:     Effort: Pulmonary effort is normal. No respiratory distress.     Breath sounds: Normal breath sounds. No stridor. No wheezing, rhonchi or rales.  Musculoskeletal:     Cervical back: Normal range of motion and neck supple. No rigidity.  Lymphadenopathy:     Cervical: No cervical adenopathy.  Skin:    General: Skin is warm and dry.     Capillary Refill: Capillary refill takes less than 2 seconds.     Findings: No erythema or rash.  Neurological:     Mental Status: He is alert and oriented to person, place, and time.     GCS: GCS eye subscore is 4. GCS verbal subscore is 5. GCS motor subscore is 6.     Cranial Nerves: No cranial nerve deficit, dysarthria or facial asymmetry.     Sensory: No  sensory deficit.     Motor: No weakness.     Coordination: Romberg sign negative. Coordination normal.     Deep Tendon Reflexes: Reflexes normal.  Psychiatric:        Mood and Affect: Mood normal.        Behavior: Behavior normal.      UC Treatments / Results  Labs (all labs ordered are listed, but only abnormal results are displayed) Labs Reviewed - No data to display  EKG   Radiology CLINICAL DATA:  Headache.  EXAM: CT HEAD WITHOUT CONTRAST  TECHNIQUE: Contiguous axial images were obtained from the base of the skull through the vertex without intravenous  contrast.  COMPARISON:  October 13, 2010.  FINDINGS: Brain: No evidence of acute infarction, hemorrhage, hydrocephalus, extra-axial collection or mass lesion/mass effect.  Vascular: No hyperdense vessel or unexpected calcification.  Skull: Normal. Negative for fracture or focal lesion.  Sinuses/Orbits: No acute finding.  Other: None.  IMPRESSION: Normal head CT.   Electronically Signed   By: Marijo Conception M.D.   On: 03/18/2021 12:26  Procedures Procedures (including critical care time)  Medications Ordered in UC Medications - No data to display  Initial Impression / Assessment and Plan / UC Course  I have reviewed the triage vital signs and the nursing notes.  Pertinent labs & imaging results that were available during my care of the patient were reviewed by me and considered in my medical decision making (see chart for details).  Clinical impression: Primary stabbing headache that began this morning at 3 AM and woke him from sleep.  He has some associated photophobia as well as nausea without vomiting.  Physical exam and vital signs are reassuring.  That said, he has a significant cardiac history.  Treatment plan: 1.  The findings and treatment plan were discussed in detail with the patient.  Patient was in agreement. 2.  I recommended we get a CT scan without contrast of his head.  We did get that approved by the insurance company.  Results are above.  No acute intracranial hemorrhage.  No areas of ischemia.  Normal CT of the head. 3.  Given that he has had similar episodes in the past although this is the most severe we will do a trial of indomethacin.  No other anti-inflammatories.  Patient was educated. 4.  Also gave him Zofran for that nausea. 5.  I have encouraged him to get in with primary care physician to further manage this.  Certainly he may benefit if he is having these a lot in the future for potential medical management with a triptan.  In addition  he may benefit from a neurology referral.  I will leave that to his PCP. 6.  Educational handouts provided. 7.  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 8.  I do want him to follow closely by his primary care physician. 9.  If his symptoms were to change in any way and worsen he knows to call 911 or go to the ER.  We discussed the red flag signs and symptoms of what to watch for. 10.  Follow-up here as needed.    Final Clinical Impressions(s) / UC Diagnoses   Final diagnoses:  Primary stabbing headache  Nausea without vomiting  Photophobia of right eye  Intractable migraine without status migrainosus, unspecified migraine type     Discharge Instructions     Your CT scan did not show any lesions, masses,  or bleeds.  It was read as normal. Given the fact that you have had similar headaches in the past, although not as severe as this, I am going to start you on a medicine called indomethacin.  I also gave you Zofran for your nausea. I want you to call your primary care provider and be seen tomorrow for recheck.  You may benefit from a migraine headache medication, but someone needs to follow you closely while you are on this. I provided you a work note just keeping about today and tomorrow. Educational handouts provided. While you take the indomethacin, please do not take any Motrin, Advil, ibuprofen, Naprosyn, Aleve, or any aspirin products.  No anti-inflammatory medicines.  You can take Tylenol only if he needs something in addition to what I prescribed.  If your headache gets really bad I need you to call 911 or have someone take you to the nearest emergency room for a higher level of care and further evaluation.  I hope you get the feeling better, and please follow-up with your primary care provider tomorrow. Dr. Drema Dallas    ED Prescriptions    Medication Sig Dispense Auth. Provider   indomethacin (INDOCIN) 25 MG capsule Take 1 capsule (25 mg total) by  mouth 3 (three) times daily as needed. 30 capsule Verda Cumins, MD   ondansetron (ZOFRAN) 4 MG tablet Take 1 tablet (4 mg total) by mouth every 6 (six) hours. 12 tablet Verda Cumins, MD     PDMP not reviewed this encounter.   Verda Cumins, MD 03/23/21 [phone]

## 2021-04-15 ENCOUNTER — Ambulatory Visit: Payer: No Typology Code available for payment source | Admitting: Family Medicine

## 2021-04-20 ENCOUNTER — Ambulatory Visit
Payer: No Typology Code available for payment source | Attending: Student in an Organized Health Care Education/Training Program | Admitting: Student in an Organized Health Care Education/Training Program

## 2021-04-20 ENCOUNTER — Other Ambulatory Visit: Payer: Self-pay

## 2021-04-20 ENCOUNTER — Encounter: Payer: Self-pay | Admitting: Student in an Organized Health Care Education/Training Program

## 2021-04-20 VITALS — BP 140/101 | HR 83 | Temp 98.2°F | Resp 16 | Ht 71.0 in | Wt 206.0 lb

## 2021-04-20 DIAGNOSIS — G894 Chronic pain syndrome: Secondary | ICD-10-CM | POA: Diagnosis present

## 2021-04-20 DIAGNOSIS — M48062 Spinal stenosis, lumbar region with neurogenic claudication: Secondary | ICD-10-CM | POA: Diagnosis not present

## 2021-04-20 DIAGNOSIS — M5416 Radiculopathy, lumbar region: Secondary | ICD-10-CM | POA: Diagnosis not present

## 2021-04-20 DIAGNOSIS — G8929 Other chronic pain: Secondary | ICD-10-CM | POA: Diagnosis present

## 2021-04-20 DIAGNOSIS — M25511 Pain in right shoulder: Secondary | ICD-10-CM | POA: Diagnosis present

## 2021-04-20 DIAGNOSIS — M5136 Other intervertebral disc degeneration, lumbar region: Secondary | ICD-10-CM | POA: Insufficient documentation

## 2021-04-20 DIAGNOSIS — M47816 Spondylosis without myelopathy or radiculopathy, lumbar region: Secondary | ICD-10-CM | POA: Insufficient documentation

## 2021-04-20 DIAGNOSIS — Z981 Arthrodesis status: Secondary | ICD-10-CM | POA: Diagnosis not present

## 2021-04-20 MED ORDER — TIZANIDINE HCL 4 MG PO TABS: 4.0000 mg | ORAL_TABLET | Freq: Two times a day (BID) | ORAL | 1 refills | Status: AC | PRN

## 2021-04-20 NOTE — Progress Notes (Signed)
PROVIDER NOTE: Information contained herein reflects review and annotations entered in association with encounter. Interpretation of such information and data should be left to medically-trained personnel. Information provided to patient can be located elsewhere in the medical record under "Patient Instructions". Document created using STT-dictation technology, any transcriptional errors that may result from process are unintentional.    Patient: John Hartman  Service Category: E/M  Provider: Gillis Santa, MD  DOB: 05-19-1971  DOS: 04/20/2021  Specialty: Interventional Pain Management  MRN: [id number]  Setting: Ambulatory outpatient  PCP: [name], FNP  Type: Established Patient    Referring Provider: [name], FNP  Location: Office  Delivery: Face-to-face     HPI  [name], a 50 y.o. year old male, is here today because of his Lumbar facet arthropathy [M47.816]. Mr. Griggs primary complain today is Back Pain (lower) Last encounter: My last encounter with him was on 02/02/2021. Pertinent problems: Mr. Caster has Coronary artery disease; Chronic lower back pain; Nephrolithiasis; Chronic pain syndrome; Lumbar radiculopathy; Spinal stenosis of lumbar region with neurogenic claudication; S/P cervical spinal fusion; and Back muscle spasm on their pertinent problem list. Pain Assessment: Severity of Chronic pain is reported as a 1 /10. Location: Back Lower/sometime to mid back. Onset: More than a month ago. Quality: Aching. Timing: Intermittent. Modifying factor(s): meds. Vitals:  height is '5\' 11"'  (1.803 m) and weight is 206 lb (93.4 kg). His temporal temperature is 98.2 F (36.8 C). His blood pressure is 140/101 (abnormal) and his pulse is 83. His respiration is 16 and oxygen saturation is 100%.   Reason for encounter: medication management.    Overall is doing well. Continues with plant based/vegan diet. Is exercising. Discussed plan for when he has pain flare- he could come in for  Norflex/Toradol and/or lumbar spine injection Has established with a new PCP which he likes Takes tizanidine 2 mg twice daily as needed for muscle spasms.  ROS  Constitutional: Denies any fever or chills Gastrointestinal: No reported hemesis, hematochezia, vomiting, or acute GI distress Musculoskeletal: Low back pain Neurological: No reported episodes of acute onset apraxia, aphasia, dysarthria, agnosia, amnesia, paralysis, loss of coordination, or loss of consciousness  Medication Review  AeroChamber MV, Dapagliflozin-metFORMIN HCl ER, Magnesium, Semaglutide, albuterol, allopurinol, amLODipine, aspirin EC, atorvastatin, benzonatate, cetirizine, diphenhydrAMINE, ezetimibe, famotidine, glucose blood, hydrALAZINE, indomethacin, metFORMIN, metoprolol tartrate, nitroGLYCERIN, ondansetron, pregabalin, promethazine-dextromethorphan, tiZANidine, and vitamin B-12  History Review  Allergy: [name] is allergic to imdur [isosorbide nitrate], lisinopril, other, drug [tape], tapentadol, and ace inhibitors. Drug: [name]  reports no history of drug use. Alcohol:  reports no history of alcohol use. Tobacco:  reports that he quit smoking about 18 years ago. His smoking use included cigarettes. He has a 10.00 pack-year smoking history. He has never used smokeless tobacco. Social: Mr. [name]  reports that he quit smoking about 18 years ago. His smoking use included cigarettes. He has a 10.00 pack-year smoking history. He has never used smokeless tobacco. He reports that he does not drink alcohol and does not use drugs. Medical:  has a past medical history of Angioedema, Bronchitis, DDD (degenerative disc disease), cervical, Diabetes mellitus ([place]), Fatty liver (03/31/2017), GERD (gastroesophageal reflux disease), Gout, Heart attack ([place]), HTN (hypertension), Hypertension, IBS (irritable bowel syndrome), Kidney stone, LVH (left ventricular hypertrophy) (11/24/2018), Myocardial infarction ([place]) (08/23/2018),  Seasonal allergies, and Spinal stenosis. Surgical: [name]  has a past surgical history that includes Shoulder surgery (Left, 2007); Nasal septum surgery (2004); ACDF with fusion (2007); Testicular  torsion (1980s); Vasectomy; Colonoscopy with propofol (N/A, 06/26/2015); Cardiac catheterization (Left, 08/15/2016); Coronary/Graft Acute MI Revascularization (N/A, 08/23/2018); LEFT HEART CATH AND CORONARY ANGIOGRAPHY (N/A, 08/23/2018); Cystoscopy with biopsy (N/A, 11/22/2019); Cystoscopy/ureteroscopy/holmium laser/stent placement (Left, 11/22/2019); Shoulder arthroscopy w/ rotator cuff repair (10/01/2019); LEFT HEART CATH AND CORONARY ANGIOGRAPHY (N/A, 02/10/2020); Cystoscopy/ureteroscopy/holmium laser (02/10/2020); Cystoscopy w/ retrogrades (02/10/2020); and Extracorporeal shock wave lithotripsy (Left, 10/29/2020). Family: family history includes Allergies in his son; Alzheimer's disease in his maternal grandmother; Arrhythmia in his mother; Breast cancer in his mother; Cancer in his maternal grandfather and mother; Diabetes in his father; Heart disease in his brother; Hypertension in his father; Skin cancer in his mother.  Laboratory Chemistry Profile   Renal Lab Results  Component Value Date   BUN 19 11/01/2020   CREATININE 1.27 (H) 42/68/3419   BCR NOT APPLICABLE 62/22/9798   GFRAA >60 06/17/2020   GFRNONAA >60 11/01/2020     Hepatic Lab Results  Component Value Date   AST 36 10/30/2020   ALT 57 (H) 10/30/2020   ALBUMIN 4.9 10/30/2020   ALKPHOS 93 10/30/2020   LIPASE 45 10/31/2020     Electrolytes Lab Results  Component Value Date   NA 132 (L) 11/01/2020   K 3.4 (L) 11/01/2020   CL 97 (L) 11/01/2020   CALCIUM 9.1 11/01/2020   MG 2.3 02/12/2020   PHOS 1.9 (L) 08/27/2016     Bone Lab Results  Component Value Date   VD25OH 30 08/04/2017     Inflammation (CRP: Acute Phase) (ESR: Chronic Phase) Lab Results  Component Value Date   LATICACIDVEN 1.5 10/29/2020       Note: Above Lab  results reviewed.  Recent Imaging Review  CT Head Wo Contrast CLINICAL DATA:  Headache.  EXAM: CT HEAD WITHOUT CONTRAST  TECHNIQUE: Contiguous axial images were obtained from the base of the skull through the vertex without intravenous contrast.  COMPARISON:  October 13, 2010.  FINDINGS: Brain: No evidence of acute infarction, hemorrhage, hydrocephalus, extra-axial collection or mass lesion/mass effect.  Vascular: No hyperdense vessel or unexpected calcification.  Skull: Normal. Negative for fracture or focal lesion.  Sinuses/Orbits: No acute finding.  Other: None.  IMPRESSION: Normal head CT.  Electronically Signed   By: Marijo Conception M.D.   On: 03/18/2021 12:26 Note: Reviewed        Physical Exam  General appearance: Well nourished, well developed, and well hydrated. In no apparent acute distress Mental status: Alert, oriented x 3 (person, place, & time)       Respiratory: No evidence of acute respiratory distress Eyes: PERLA Vitals: BP (!) 140/101   Pulse 83   Temp 98.2 F (36.8 C) (Temporal)   Resp 16   Ht '5\' 11"'  (1.803 m)   Wt 206 lb (93.4 kg)   SpO2 100%   BMI 28.73 kg/m  BMI: Estimated body mass index is 28.73 kg/m as calculated from the following:   Height as of this encounter: '5\' 11"'  (1.803 m).   Weight as of this encounter: 206 lb (93.4 kg). Ideal: Ideal body weight: 75.3 kg (166 lb 0.1 oz) Adjusted ideal body weight: 82.6 kg (182 lb 0.1 oz)  Musculoskeletal low back pain Pain with facet loading  5 out of 5 strength bilateral lower extremity: Plantar flexion, dorsiflexion, knee flexion, knee extension.   Assessment   Status Diagnosis  Controlled Controlled Controlled 1. Lumbar facet arthropathy   2. Spondylosis of lumbar region without myelopathy or radiculopathy   3. Lumbar radiculopathy   4.  Spinal stenosis of lumbar region with neurogenic claudication   5. S/P cervical spinal fusion   6. Degenerative disc disease, lumbar   7.  Chronic right shoulder pain   8. Chronic pain syndrome      Plan of Care   Mr. Searcy Miyoshi has a current medication list which includes the following long-term medication(s): allopurinol, amlodipine, atorvastatin, ezetimibe, hydralazine, metoprolol tartrate, nitroglycerin, pregabalin, albuterol, and [DISCONTINUED] metformin.  Pharmacotherapy (Medications Ordered): Meds ordered this encounter  Medications  . tiZANidine (ZANAFLEX) 4 MG tablet    Sig: Take 1 tablet (4 mg total) by mouth 2 (two) times daily as needed for muscle spasms.    Dispense:  60 tablet    Refill:  1    Do not place this medication, or any other prescription from our practice, on "Automatic Refill". Patient may have prescription filled one day early if pharmacy is closed on scheduled refill date.   Follow-up plan:   Return if symptoms worsen or fail to improve.   Recent Visits No visits were found meeting these conditions. Showing recent visits within past 90 days and meeting all other requirements Today's Visits Date Type Provider Dept  04/20/21 Office Visit Gillis Santa, MD [facility]  Showing today's visits and meeting all other requirements Future Appointments No visits were found meeting these conditions. Showing future appointments within next 90 days and meeting all other requirements  I discussed the assessment and treatment plan with the patient. The patient was provided an opportunity to ask questions and all were answered. The patient agreed with the plan and demonstrated an understanding of the instructions.  Patient advised to call back or seek an in-person evaluation if the symptoms or condition worsens.  Duration of encounter: 15 minutes.  Note by: Gillis Santa, MD Date: 04/20/2021; Time: 3:12 PM

## 2021-04-20 NOTE — Progress Notes (Signed)
Safety precautions to be maintained throughout the outpatient stay will include: orient to surroundings, keep bed in low position, maintain call bell within reach at all times, provide assistance with transfer out of bed and ambulation.  

## 2021-04-22 ENCOUNTER — Encounter
Payer: No Typology Code available for payment source | Admitting: Student in an Organized Health Care Education/Training Program

## 2021-05-13 ENCOUNTER — Other Ambulatory Visit: Payer: Self-pay | Admitting: Student in an Organized Health Care Education/Training Program

## 2021-05-18 ENCOUNTER — Ambulatory Visit: Payer: No Typology Code available for payment source | Admitting: Urology

## 2021-06-17 ENCOUNTER — Other Ambulatory Visit: Payer: Self-pay | Admitting: Family Medicine

## 2021-06-17 DIAGNOSIS — M4800 Spinal stenosis, site unspecified: Secondary | ICD-10-CM

## 2021-06-17 DIAGNOSIS — M792 Neuralgia and neuritis, unspecified: Secondary | ICD-10-CM

## 2021-06-22 ENCOUNTER — Ambulatory Visit: Admit: 2021-06-22 | Discharge status: Absent without leave | Payer: No Typology Code available for payment source

## 2021-06-29 ENCOUNTER — Other Ambulatory Visit: Payer: Self-pay

## 2021-06-29 ENCOUNTER — Ambulatory Visit
Admission: RE | Admit: 2021-06-29 | Discharge: 2021-06-29 | Disposition: A | Discharge status: Home / Self Care | Payer: No Typology Code available for payment source | Source: Ambulatory Visit | Attending: Urology | Admitting: Urology

## 2021-06-29 DIAGNOSIS — N200 Calculus of kidney: Secondary | ICD-10-CM | POA: Insufficient documentation

## 2021-07-06 ENCOUNTER — Encounter: Payer: Self-pay | Admitting: Urology

## 2021-07-06 ENCOUNTER — Other Ambulatory Visit: Payer: Self-pay

## 2021-07-06 ENCOUNTER — Ambulatory Visit: Payer: No Typology Code available for payment source | Admitting: Urology

## 2021-07-06 VITALS — BP 129/90 | HR 79 | Ht 71.0 in | Wt 205.0 lb

## 2021-07-06 DIAGNOSIS — S37019D Minor contusion of unspecified kidney, subsequent encounter: Secondary | ICD-10-CM | POA: Diagnosis not present

## 2021-07-06 DIAGNOSIS — N200 Calculus of kidney: Secondary | ICD-10-CM | POA: Diagnosis not present

## 2021-07-06 NOTE — Patient Instructions (Signed)
Textbook of Natural Medicine (5th ed., pp. 1518-1527.e3). [place], [place]: Elsevier.">  Dietary Guidelines to Help Prevent Kidney Stones Kidney stones are deposits of minerals and salts that form inside your kidneys. Your risk of developing kidney stones may be greater depending on your diet, your lifestyle, the medicines you take, and whether you have certain medical conditions. Most people can lower their chances of developing kidney stones by following the instructions below. Your dietitian may give you more specific instructions depending on your overall health and the type of kidney stones youtend to develop. What are tips for following this plan? Reading food labels  Choose foods with "no salt added" or "low-salt" labels. Limit your salt (sodium) intake to less than 1,500 mg a day. Choose foods with calcium for each meal and snack. Try to eat about 300 mg of calcium at each meal. Foods that contain 200-500 mg of calcium a serving include: 8 oz (237 mL) of milk, calcium-fortifiednon-dairy milk, and calcium-fortifiedfruit juice. Calcium-fortified means that calcium has been added to these drinks. 8 oz (237 mL) of kefir, yogurt, and soy yogurt. 4 oz (114 g) of tofu. 1 oz (28 g) of cheese. 1 cup (150 g) of dried figs. 1 cup (91 g) of cooked broccoli. One 3 oz (85 g) can of sardines or mackerel. Most people need 1,000-1,500 mg of calcium a day. Talk to your dietitian abouthow much calcium is recommended for you. Shopping Buy plenty of fresh fruits and vegetables. Most people do not need to avoid fruits and vegetables, even if these foods contain nutrients that may contribute to kidney stones. When shopping for convenience foods, choose: Whole pieces of fruit. Pre-made salads with dressing on the side. Low-fat fruit and yogurt smoothies. Avoid buying frozen meals or prepared deli foods. These can be high in sodium. Look for foods with live cultures, such as yogurt and kefir. Choose high-fiber  grains, such as whole-wheat breads, oat bran, and wheat cereals. Cooking Do not add salt to food when cooking. Place a salt shaker on the table and allow each person to add his or her own salt to taste. Use vegetable protein, such as beans, textured vegetable protein (TVP), or tofu, instead of meat in pasta, casseroles, and soups. Meal planning Eat less salt, if told by your dietitian. To do this: Avoid eating processed or pre-made food. Avoid eating fast food. Eat less animal protein, including cheese, meat, poultry, or fish, if told by your dietitian. To do this: Limit the number of times you have meat, poultry, fish, or cheese each week. Eat a diet free of meat at least 2 days a week. Eat only one serving each day of meat, poultry, fish, or seafood. When you prepare animal protein, cut pieces into small portion sizes. For most meat and fish, one serving is about the size of the palm of your hand. Eat at least five servings of fresh fruits and vegetables each day. To do this: Keep fruits and vegetables on hand for snacks. Eat one piece of fruit or a handful of berries with breakfast. Have a salad and fruit at lunch. Have two kinds of vegetables at dinner. Limit foods that are high in a substance called oxalate. These include: Spinach (cooked), rhubarb, beets, sweet potatoes, and Swiss chard. Peanuts. Potato chips, french fries, and baked potatoes with skin on. Nuts and nut products. Chocolate. If you regularly take a diuretic medicine, make sure to eat at least 1 or 2 servings of fruits or vegetables that are   high in potassium each day. These include: Avocado. Banana. Orange, prune, carrot, or tomato juice. Baked potato. Cabbage. Beans and split peas. Lifestyle  Drink enough fluid to keep your urine pale yellow. This is the most important thing you can do. Spread your fluid intake throughout the day. If you drink alcohol: Limit how much you use to: 0-1 drink a day for women who  are not pregnant. 0-2 drinks a day for men. Be aware of how much alcohol is in your drink. In the [place] one drink equals one 12 oz bottle of beer (355 mL), one 5 oz glass of wine (148 mL), or one 1 oz glass of hard liquor (44 mL). Lose weight if told by your health care provider. Work with your dietitian to find an eating plan and weight loss strategies that work best for you.  General information Talk to your health care provider and dietitian about taking daily supplements. You may be told the following depending on your health and the cause of your kidney stones: Not to take supplements with vitamin C. To take a calcium supplement. To take a daily probiotic supplement. To take other supplements such as magnesium, fish oil, or vitamin B6. Take over-the-counter and prescription medicines only as told by your health care provider. These include supplements. What foods should I limit? Limit your intake of the following foods, or eat them as told by your dietitian. Vegetables Spinach. Rhubarb. Beets. Canned vegetables. Pickles. Olives. Baked potatoeswith skin. Grains Wheat bran. Baked goods. Salted crackers. Cereals high in sugar. Meats and other proteins Nuts. Nut butters. Large portions of meat, poultry, or fish. Salted, precooked,or cured meats, such as sausages, meat loaves, and hot dogs. Dairy Cheese. Beverages Regular soft drinks. Regular vegetable juice. Seasonings and condiments Seasoning blends with salt. Salad dressings. Soy sauce. Ketchup. Barbecue sauce. Other foods Canned soups. Canned pasta sauce. Casseroles. Pizza. Lasagna. Frozen meals.Potato chips. French fries. The items listed above may not be a complete list of foods and beverages you should limit. Contact a dietitian for more information. What foods should I avoid? Talk to your dietitian about specific foods you should avoid based on the typeof kidney stones you have and your overall health. Fruits Grapefruit. The  item listed above may not be a complete list of foods and beverages you should avoid. Contact a dietitian for more information. Summary Kidney stones are deposits of minerals and salts that form inside your kidneys. You can lower your risk of kidney stones by making changes to your diet. The most important thing you can do is drink enough fluid. Drink enough fluid to keep your urine pale yellow. Talk to your dietitian about how much calcium you should have each day, and eat less salt and animal protein as told by your dietitian. This information is not intended to replace advice given to you by your health care provider. Make sure you discuss any questions you have with your healthcare provider. Document Revised: 12/05/2019 Document Reviewed: 12/05/2019 Elsevier Patient Education  2022 Elsevier Inc.  

## 2021-07-06 NOTE — Progress Notes (Signed)
   07/06/2021 2:32 PM   Olena Mater [birth year] [id number]  Reason for visit: Follow up nephrolithiasis, renal hematoma  HPI: 50 year old male with long history of recurrent nephrolithiasis and extensive cardiac history.  He has not tolerated ureteral stents well in the past, but has done relatively well with ureteroscopy previously when a stent was not placed.  He underwent left-sided shockwave lithotripsy for a 6 mm nonobstructing left lower pole stone with Dr. Erlene Quan in October 0979 which was complicated by a 7 cm left renal hematoma that required admission for pain and nausea control, but no transfusion.  He denies any problems since we saw him last, specifically any flank pain or gross hematuria.  Blood pressure is good today at 129/90.  I personally viewed and interpreted the renal ultrasound dated 06/29/2021 that shows no hydronephrosis, stones, or residual hematoma.  Renal function is normal with creatinine 1.1, EGFR greater than 60.   He previously completed a 24-hour urine that aside from mildly elevated oxalate was essentially normal.  He has cut out high oxalate foods, and continues to drink large volumes of fluid.  We discussed general stone prevention strategies including adequate hydration with goal of producing 2.5 L of urine daily, increasing citric acid intake, increasing calcium intake during high oxalate meals, minimizing animal protein, and decreasing salt intake. Information about dietary recommendations given today.   RTC 1 year KUB prior and for blood pressure   Billey Co, MD  [facility] [address], [place], [place] [address] [phone]

## 2021-07-13 ENCOUNTER — Ambulatory Visit
Payer: No Typology Code available for payment source | Attending: Student in an Organized Health Care Education/Training Program | Admitting: Student in an Organized Health Care Education/Training Program

## 2021-07-13 ENCOUNTER — Other Ambulatory Visit: Payer: Self-pay

## 2021-07-13 ENCOUNTER — Encounter: Payer: Self-pay | Admitting: Student in an Organized Health Care Education/Training Program

## 2021-07-13 DIAGNOSIS — Z981 Arthrodesis status: Secondary | ICD-10-CM | POA: Diagnosis not present

## 2021-07-13 DIAGNOSIS — M5412 Radiculopathy, cervical region: Secondary | ICD-10-CM | POA: Diagnosis not present

## 2021-07-13 DIAGNOSIS — G894 Chronic pain syndrome: Secondary | ICD-10-CM | POA: Diagnosis not present

## 2021-07-13 NOTE — Progress Notes (Signed)
Patient: John Hartman  Service Category: E/M  Provider: Gillis Santa, MD  DOB: [birth year]  DOS: 07/13/2021  Location: Office  MRN: [id number]  Setting: Ambulatory outpatient  Referring Provider: Juanda Bond, NP  Type: Established Patient  Specialty: Interventional Pain Management  PCP: John Bond, NP  Location: Home  Delivery: TeleHealth     Virtual Encounter - Pain Management PROVIDER NOTE: Information contained herein reflects review and annotations entered in association with encounter. Interpretation of such information and data should be left to medically-trained personnel. Information provided to patient can be located elsewhere in the medical record under "Patient Instructions". Document created using STT-dictation technology, any transcriptional errors that may result from process are unintentional.    Contact & Pharmacy Preferred: [phone] Home: [phone] (home) Mobile: [phone] (mobile) E-mail: [email or web address]  [id number] [place] [address]: [phone]: [address]Phone: [phone]: [phone]  Pre-screening  John Hartman offered "in-person" vs "virtual" encounter. He indicated preferring virtual for this encounter.   Reason COVID-19*  Social distancing based on CDC and AMA recommendations.   I contacted John DILONEon 07/13/2021 via video conference.      I clearly identified myself as BGillis Santa MD. I verified that I was speaking with the correct person using two identifiers (Name: John Hartman and date of birth: 403-May-1972.  Consent I sought verbal advanced consent from John Materfor virtual visit interactions. I informed John Hartman possible security and privacy concerns, risks, and limitations associated with providing  "not-in-person" medical evaluation and management services. I also informed Mr. CWingertof the availability of "in-person" appointments. Finally, I informed him that there would be a charge for the virtual visit and that he could be  personally, fully or partially, financially responsible for it. John Hartman understanding and agreed to proceed.   Historic Elements   [name] a 50y.o. year old, male patient evaluated today after our last contact on 05/13/2021. John Hartman has a past medical history of Angioedema, Bronchitis, DDD (degenerative disc disease), cervical, Diabetes mellitus ([place], Fatty liver (03/31/2017), GERD (gastroesophageal reflux disease), Gout, Heart attack ([place], HTN (hypertension), Hypertension, IBS (irritable bowel syndrome), Kidney stone, LVH (left ventricular hypertrophy) (11/24/2018), Myocardial infarction ([place] (08/23/2018), Seasonal allergies, and Spinal stenosis. He also  has a past surgical history that includes Shoulder surgery (Left, 2007); Nasal septum surgery (2004); ACDF with fusion (2007); Testicular torsion (1980s); Vasectomy; Colonoscopy with propofol (N/A, 06/26/2015); Cardiac catheterization (Left, 08/15/2016); Coronary/Graft Acute MI Revascularization (N/A, 08/23/2018); LEFT HEART CATH AND CORONARY ANGIOGRAPHY (N/A, 08/23/2018); Cystoscopy with biopsy (N/A, 11/22/2019); Cystoscopy/ureteroscopy/holmium laser/stent placement (Left, 11/22/2019); Shoulder arthroscopy w/ rotator cuff repair (10/01/2019); LEFT HEART CATH AND CORONARY ANGIOGRAPHY (N/A, 02/10/2020); Cystoscopy/ureteroscopy/holmium laser (02/10/2020); Cystoscopy w/ retrogrades (02/10/2020); and Extracorporeal shock wave lithotripsy (Left, 10/29/2020). [name] allopurinol, amlodipine, aspirin ec, cetirizine, cyclobenzaprine, diphenhydramine, epinephrine, ezetimibe, famotidine, indomethacin, magnesium, metoprolol tartrate,  nitroglycerin, onetouch delica lancets 346K onetouch ultra, pregabalin, rybelsus, xigduo xr, atorvastatin, hydralazine, and [DISCONTINUED] metformin. He  reports that he quit smoking about 18 years ago. His smoking use included cigarettes. He has a 10.00 pack-year smoking history. He has never used smokeless tobacco. He reports that he does not drink alcohol  and does not use drugs. John Hartman is allergic to imdur [isosorbide nitrate], lisinopril, other, drug [tape], tapentadol, and ace inhibitors.   HPI  Today, he is being contacted for new problems.  Patient endorses severe right neck pain that radiates up to his right occiput and to his right ear region.  Of note patient has a history of cervical degenerative disc disease and cervical stenosis with a history of cervical spinal fusion.  He is having pain that radiates into the right side of his neck, his head and into his face as well.  He had a cervical MRI which shows severe degenerative changes along the right C2-C3 with mild anterolisthesis and advanced facet arthropathy.  He had a C4-C7 fusion.  He has moderate bilateral foraminal stenosis at C7-T1. We discussed therapeutic options which could include cervical epidural steroid injection as well as C2-C3 right facet joint injection.  Patient states that he has an upcoming appointment with Dr. Cari Caraway with neurosurgery.  From a risk benefit standpoint, we discussed starting with a cervical epidural injection first.  If this is not helpful, we can consider a right C2-C3 facet joint injection.  Patient endorsed understanding.   Laboratory Chemistry Profile   Renal Lab Results  Component Value Date   BUN 19 11/01/2020   CREATININE 1.27 (H) 91/69/4503   BCR NOT APPLICABLE 88/82/8003   GFRAA >60 06/17/2020   GFRNONAA >60 11/01/2020    Hepatic Lab Results  Component Value Date   AST 36 10/30/2020   ALT 57 (H) 10/30/2020   ALBUMIN 4.9 10/30/2020   ALKPHOS 93 10/30/2020   LIPASE 45  10/31/2020    Electrolytes Lab Results  Component Value Date   NA 132 (L) 11/01/2020   K 3.4 (L) 11/01/2020   CL 97 (L) 11/01/2020   CALCIUM 9.1 11/01/2020   MG 2.3 02/12/2020   PHOS 1.9 (L) 08/27/2016    Bone Lab Results  Component Value Date   VD25OH 30 08/04/2017    Inflammation (CRP: Acute Phase) (ESR: Chronic Phase) Lab Results  Component Value Date   LATICACIDVEN 1.5 10/29/2020         Note: Above Lab results reviewed.  Imaging  Ultrasound renal complete CLINICAL DATA:  History of urinary tract stones.  EXAM: RENAL / URINARY TRACT ULTRASOUND COMPLETE  COMPARISON:  CT abdomen and pelvis 10/29/2020. Renal ultrasound 11/12/2020. KUB 11/17/2020.  FINDINGS: Right Kidney:  Renal measurements: 12.2 x 7.6 x 5.3 cm = volume: 257.1 mL. Echogenicity within normal limits. No mass or hydronephrosis visualized.  Left Kidney:  Renal measurements: 11.6 x 6.0 x 5.6 cm = volume: 204.1 mL. Echogenicity within normal limits. A renal cyst with a single thin septation in the mid to upper pole measures 2.1 cm in diameter and is seen on the prior ultrasound. No solid mass or hydronephrosis visualized.  Bladder:  Appears normal for degree of bladder distention.  Other:  None.  IMPRESSION: Negative for hydronephrosis or acute abnormality. No urinary tract stones are seen.  Electronically Signed   By: Inge Rise M.D.   On: 06/30/2021 09:23  MRI CERVICAL SPINE WITHOUT CONTRAST   HISTORY: Evaluate cervical and lumbar radiculopathy, stenosis, DDD. Pt  with hx of DDD and stenosis. Pt reports cervical and lumbar pain x 4  plus years radiating into right side of neck, head, and into face.  Numbness radiating into left leg to knee. No surgery   COMPARISON: None   TECHNIQUE: Multiplanar, multi sequence imaging of the cervical spine  was  performed without gadolinium.   FINDINGS:   Patient has had anterior cervical interbody fusion spanning C4-C7.   Straightening of the fused segment. Trace anterolisthesis of C2 in  relation to C3. Vertebral body heights are maintained. No infiltrative  marrow replacement process. There may be mild marrow edema associated  with facet arthropathy on the right at C2/C3. Craniocervical junction  and posterior elements are intact. Prevertebral soft tissues are  normal. Incidentally imaged cervical soft tissues demonstrate no acute  abnormalities. Cervical spinal cord maintains normal caliber, signal  intensity and morphology throughout.   C2/C3: Trace anterolisthesis. Unroofing of the disc and mild bulging  of the disc. Mild ligament of flavum hypertrophy. Moderate right and  mild left foraminal stenosis primarily due to facet arthropathy, and  to a lesser degree, uncovertebral spurring. Asymmetric advanced facet  arthropathy on the right.   C3/C4: Mild bulging of the disc minimally contacts and flattens the  ventral cord. CSF dorsal to the cord is maintained. Mild to moderate  right and mild left foraminal stenosis due to uncovertebral spurring.   C4/C5: Fused segment. Central canal and left neural foramen are  patent. Right neural foramen is patent.   C5/C6: Fused segment. Central canal and foramina are patent.   The left C6/C7: Central canal and foramina are patent.   C6/C7: Fused segment. Central canal is patent. There may be at least  moderate bilateral foraminal stenoses due to endplate spurring and  facet arthropathy.   C7/T1: Central canal is patent. Moderate foraminal stenosis.   T1/T2: Mild bulging of the disc. Central canal foramina are otherwise  patent.   IMPRESSION:   Degenerative changes of the cervical spine are most pronounced at  C2/C3 where there is mild anterolisthesis and asymmetric advanced  facet arthropathy on the right with mild edema, potentially  contributing to patient's neck pain syndrome.   Extensive anterior cervical fusion spans C4 through C7.   Foramina  at the fused C6/C7 level are not well evaluated, possibly  moderately stenotic.   Moderate bilateral foraminal stenoses at C7/T1.   Central canal stenosis is most pronounced at C3/C4 where there is  minimal flattening of the ventral cord. No critical central canal  stenosis.  Assessment  The primary encounter diagnosis was Cervical radicular pain. Diagnoses of S/P cervical spinal fusion and Chronic pain syndrome were also pertinent to this visit.  Plan of Care    John Hartman has a current medication list which includes the following long-term medication(s): allopurinol, amlodipine, ezetimibe, metoprolol tartrate, nitroglycerin, pregabalin, atorvastatin, hydralazine, and [DISCONTINUED] metformin.  1.  Plan for cervical epidural steroid injection  Future considerations include right C2-C3 facet joint injection.   Orders:  Orders Placed This Encounter  Procedures   Cervical Epidural Injection    Level(s): C7-T1 Laterality: TBD Purpose: Diagnostic/Therapeutic Indication(s): Radiculitis and cervicalgia associater with cervical degenerative disc disease.    Standing Status:   Future    Standing Expiration Date:   08/13/2021    Scheduling Instructions:     Procedure: Cervical Epidural Steroid Injection/Block     Sedation: Patient's choice.     Timeframe: As soon as schedule allows    Order Specific Question:   Where will this procedure be performed?    Answer:   [facility]    Comments:   [facility]    Follow-up plan:   Return in about 1 day (around 07/14/2021) for C-ESI (ASAP).    Recent Visits Date Type Provider Dept  04/20/21 Office Visit  Gillis Santa, MD [facility]  Showing recent visits within past 90 days and meeting all other requirements Today's Visits Date Type Provider Dept  07/13/21 Telemedicine Gillis Santa, MD [facility]  Showing today's visits and meeting all other requirements Future Appointments No visits were found  meeting these conditions. Showing future appointments within next 90 days and meeting all other requirements I discussed the assessment and treatment plan with the patient. The patient was provided an opportunity to ask questions and all were answered. The patient agreed with the plan and demonstrated an understanding of the instructions.  Patient advised to call back or seek an in-person evaluation if the symptoms or condition worsens.  Duration of encounter: 68mnutes.  Note by: BGillis Santa MD Date: 07/13/2021; Time: 1:36 PM

## 2021-07-16 ENCOUNTER — Encounter: Payer: Self-pay | Admitting: Student in an Organized Health Care Education/Training Program

## 2021-07-19 ENCOUNTER — Encounter: Payer: Self-pay | Admitting: Student in an Organized Health Care Education/Training Program

## 2021-07-19 ENCOUNTER — Other Ambulatory Visit: Payer: Self-pay

## 2021-07-19 ENCOUNTER — Ambulatory Visit
Payer: No Typology Code available for payment source | Attending: Student in an Organized Health Care Education/Training Program | Admitting: Student in an Organized Health Care Education/Training Program

## 2021-07-19 VITALS — BP 143/95 | HR 99 | Temp 98.5°F | Ht 71.0 in | Wt 205.0 lb

## 2021-07-19 DIAGNOSIS — M5412 Radiculopathy, cervical region: Secondary | ICD-10-CM | POA: Insufficient documentation

## 2021-07-19 DIAGNOSIS — Z981 Arthrodesis status: Secondary | ICD-10-CM | POA: Insufficient documentation

## 2021-07-19 DIAGNOSIS — Z0289 Encounter for other administrative examinations: Secondary | ICD-10-CM | POA: Insufficient documentation

## 2021-07-19 DIAGNOSIS — G894 Chronic pain syndrome: Secondary | ICD-10-CM | POA: Diagnosis present

## 2021-07-19 MED ORDER — OXYCODONE-ACETAMINOPHEN 7.5-325 MG PO TABS: 1.0000 | ORAL_TABLET | Freq: Four times a day (QID) | ORAL | 0 refills | Status: AC | PRN

## 2021-07-19 NOTE — Progress Notes (Signed)
Safety precautions to be maintained throughout the outpatient stay will include: orient to surroundings, keep bed in low position, maintain call bell within reach at all times, provide assistance with transfer out of bed and ambulation.  

## 2021-07-19 NOTE — Telephone Encounter (Signed)
Called patient, he agreed to come at 1330 today.

## 2021-07-19 NOTE — Progress Notes (Signed)
PROVIDER NOTE: Information contained herein reflects review and annotations entered in association with encounter. Interpretation of such information and data should be left to medically-trained personnel. Information provided to patient can be located elsewhere in the medical record under "Patient Instructions". Document created using STT-dictation technology, any transcriptional errors that may result from process are unintentional.    Patient: John Hartman  Service Category: E/M  Provider: Gillis Santa, MD  DOB: 12-10-1971  DOS: 07/19/2021  Specialty: Interventional Pain Management  MRN: [id number]  Setting: Ambulatory outpatient  PCP: Juanda Bond, NP  Type: Established Patient    Referring Provider: Juanda Bond, NP  Location: Office  Delivery: Face-to-face     HPI  [name], a 50 y.o. year old male, is here today because of his Cervical radicular pain [M54.12]. [name] primary complain today is Neck Pain Last encounter: My last encounter with him was on 05/13/2021. Pertinent problems: John Hartman has Coronary artery disease; Chronic lower back pain; Nephrolithiasis; Chronic pain syndrome; Lumbar radiculopathy; Spinal stenosis of lumbar region with neurogenic claudication; S/P cervical spinal fusion; and Back muscle spasm on their pertinent problem list. Pain Assessment: Severity of Chronic pain is reported as a 7 /10. Location: Neck Lower/pain radiaties acroos his face on the right side. Onset:  . Quality: Tingling, Headache, Numbness, Aching. Timing:  . Modifying factor(s): nothing. Vitals:  height is '5\' 11"'  (1.803 m) and weight is 205 lb (93 kg). His temperature is 98.5 F (36.9 C). His blood pressure is 143/95 (abnormal) and his pulse is 99. His oxygen saturation is 100%.   Reason for encounter: worsening of previously known (established) problem  Patient is having increased right neck pain, right ear pain and right facial pain related to C2-C3 right foraminal stenosis which  is present on his most recent MRI. He had a rough weekend, increased pain, is having trouble functioning and working. Is somewhat frustrated with his prior pain physician in [place] who told him that there was nothing remarkable on his cervical MRI He is requesting a short supply of "pain medications" Of note I have a peer to peer tomorrow scheduled to hopefully get his cervical epidural injection approved I will have patient complete baseline urine toxicology screen and I will have him sign pain contract.  Prescription of Percocet as below.  Pharmacotherapy Assessment  Analgesic: First prescription today, Percocet, see below  Monitoring: [place] PMP: PDMP reviewed during this encounter.       Pharmacotherapy: No side-effects or adverse reactions reported. Compliance: No problems identified. Effectiveness: Clinically acceptable.  Chauncey Fischer, RN  07/19/2021  1:33 PM  Sign when Signing Visit Safety precautions to be maintained throughout the outpatient stay will include: orient to surroundings, keep bed in low position, maintain call bell within reach at all times, provide assistance with transfer out of bed and ambulation.       UDS:  Summary  Date Value Ref Range Status  11/13/2018 FINAL  Final    Comment:    ==================================================================== TOXASSURE SELECT 13 (MW) ==================================================================== Test                             Result       Flag       Units   NO DRUGS DETECTED. ==================================================================== Test                      Result    Flag  Units      Ref Range   Creatinine              91               mg/dL      >=20 ==================================================================== Declared Medications:  Medication list was not provided. ==================================================================== For clinical consultation, please call [phone]. ====================================================================      ROS  Constitutional: Denies any fever or chills Gastrointestinal: No reported hemesis, hematochezia, vomiting, or acute GI distress Musculoskeletal:  Right neck pain, right ear pain, right facial pain Neurological: No reported episodes of acute onset apraxia, aphasia, dysarthria, agnosia, amnesia, paralysis, loss of coordination, or loss of consciousness  Medication Review  Dapagliflozin-metFORMIN HCl ER, EPINEPHrine, Magnesium, OneTouch Delica Lancets 81X, Semaglutide, allopurinol, amLODipine, aspirin EC, cetirizine, diphenhydrAMINE, ezetimibe, famotidine, glucose blood, hydrALAZINE, indomethacin, metFORMIN, metoprolol tartrate, nitroGLYCERIN, oxyCODONE-acetaminophen, and pregabalin  History Review  Allergy: John Hartman is allergic to imdur [isosorbide nitrate], lisinopril, other, drug [tape], tapentadol, and ace inhibitors. Drug: John Hartman  reports no history of drug use. Alcohol:  reports no history of alcohol use. Tobacco:  reports that he quit smoking about 18 years ago. His smoking use included cigarettes. He has a 10.00 pack-year smoking history. He has never used smokeless tobacco. Social: John Hartman  reports that he quit smoking about 18 years ago. His smoking use included cigarettes. He has a 10.00 pack-year smoking history. He has never used smokeless tobacco. He reports that he does not drink alcohol and does not use drugs. Medical:  has a past medical history of Angioedema, Bronchitis, DDD (degenerative disc disease), cervical, Diabetes mellitus ([place]), Fatty liver (03/31/2017), GERD (gastroesophageal reflux disease), Gout, Heart attack ([place]), HTN (hypertension), Hypertension, IBS (irritable bowel syndrome), Kidney stone, LVH (left ventricular hypertrophy) (11/24/2018), Myocardial infarction ([place]) (08/23/2018), Seasonal allergies, and Spinal stenosis. Surgical: Mr. Pagliuca  has a past surgical history that  includes Shoulder surgery (Left, 2007); Nasal septum surgery (2004); ACDF with fusion (2007); Testicular torsion (1980s); Vasectomy; Colonoscopy with propofol (N/A, 06/26/2015); Cardiac catheterization (Left, 08/15/2016); Coronary/Graft Acute MI Revascularization (N/A, 08/23/2018); LEFT HEART CATH AND CORONARY ANGIOGRAPHY (N/A, 08/23/2018); Cystoscopy with biopsy (N/A, 11/22/2019); Cystoscopy/ureteroscopy/holmium laser/stent placement (Left, 11/22/2019); Shoulder arthroscopy w/ rotator cuff repair (10/01/2019); LEFT HEART CATH AND CORONARY ANGIOGRAPHY (N/A, 02/10/2020); Cystoscopy/ureteroscopy/holmium laser (02/10/2020); Cystoscopy w/ retrogrades (02/10/2020); and Extracorporeal shock wave lithotripsy (Left, 10/29/2020). Family: family history includes Allergies in his son; Alzheimer's disease in his maternal grandmother; Arrhythmia in his mother; Breast cancer in his mother; Cancer in his maternal grandfather and mother; Diabetes in his father; Heart disease in his brother; Hypertension in his father; Skin cancer in his mother.  Laboratory Chemistry Profile   Renal Lab Results  Component Value Date   BUN 19 11/01/2020   CREATININE 1.27 (H) 91/47/8295   BCR NOT APPLICABLE 62/13/0865   GFRAA >60 06/17/2020   GFRNONAA >60 11/01/2020    Hepatic Lab Results  Component Value Date   AST 36 10/30/2020   ALT 57 (H) 10/30/2020   ALBUMIN 4.9 10/30/2020   ALKPHOS 93 10/30/2020   LIPASE 45 10/31/2020    Electrolytes Lab Results  Component Value Date   NA 132 (L) 11/01/2020   K 3.4 (L) 11/01/2020   CL 97 (L) 11/01/2020   CALCIUM 9.1 11/01/2020   MG 2.3 02/12/2020   PHOS 1.9 (L) 08/27/2016    Bone Lab Results  Component Value Date   VD25OH 30 08/04/2017    Inflammation (CRP: Acute Phase) (ESR: Chronic Phase) Lab  Results  Component Value Date   LATICACIDVEN 1.5 10/29/2020         Note: Above Lab results reviewed.  Recent Imaging Review  Ultrasound renal complete CLINICAL DATA:  History of  urinary tract stones.  EXAM: RENAL / URINARY TRACT ULTRASOUND COMPLETE  COMPARISON:  CT abdomen and pelvis 10/29/2020. Renal ultrasound 11/12/2020. KUB 11/17/2020.  FINDINGS: Right Kidney:  Renal measurements: 12.2 x 7.6 x 5.3 cm = volume: 257.1 mL. Echogenicity within normal limits. No mass or hydronephrosis visualized.  Left Kidney:  Renal measurements: 11.6 x 6.0 x 5.6 cm = volume: 204.1 mL. Echogenicity within normal limits. A renal cyst with a single thin septation in the mid to upper pole measures 2.1 cm in diameter and is seen on the prior ultrasound. No solid mass or hydronephrosis visualized.  Bladder:  Appears normal for degree of bladder distention.  Other:  None.  IMPRESSION: Negative for hydronephrosis or acute abnormality. No urinary tract stones are seen.  Electronically Signed   By: Inge Rise M.D.   On: 06/30/2021 09:23 Note: Reviewed        MRI CERVICAL SPINE WITHOUT CONTRAST   HISTORY: Evaluate cervical and lumbar radiculopathy, stenosis, DDD. Pt  with hx of DDD and stenosis. Pt reports cervical and lumbar pain x 4  plus years radiating into right side of neck, head, and into face.  Numbness radiating into left leg to knee. No surgery   COMPARISON: None   TECHNIQUE: Multiplanar, multi sequence imaging of the cervical spine  was performed without gadolinium.   FINDINGS:   Patient has had anterior cervical interbody fusion spanning C4-C7.  Straightening of the fused segment. Trace anterolisthesis of C2 in  relation to C3. Vertebral body heights are maintained. No infiltrative  marrow replacement process. There may be mild marrow edema associated  with facet arthropathy on the right at C2/C3. Craniocervical junction  and posterior elements are intact. Prevertebral soft tissues are  normal. Incidentally imaged cervical soft tissues demonstrate no acute  abnormalities. Cervical spinal cord maintains normal caliber, signal  intensity  and morphology throughout.   C2/C3: Trace anterolisthesis. Unroofing of the disc and mild bulging  of the disc. Mild ligament of flavum hypertrophy. Moderate right and  mild left foraminal stenosis primarily due to facet arthropathy, and  to a lesser degree, uncovertebral spurring. Asymmetric advanced facet  arthropathy on the right.   C3/C4: Mild bulging of the disc minimally contacts and flattens the  ventral cord. CSF dorsal to the cord is maintained. Mild to moderate  right and mild left foraminal stenosis due to uncovertebral spurring.   C4/C5: Fused segment. Central canal and left neural foramen are  patent. Right neural foramen is patent.   C5/C6: Fused segment. Central canal and foramina are patent.   The left C6/C7: Central canal and foramina are patent.   C6/C7: Fused segment. Central canal is patent. There may be at least  moderate bilateral foraminal stenoses due to endplate spurring and  facet arthropathy.   C7/T1: Central canal is patent. Moderate foraminal stenosis.   T1/T2: Mild bulging of the disc. Central canal foramina are otherwise  patent.   IMPRESSION:   Degenerative changes of the cervical spine are most pronounced at  C2/C3 where there is mild anterolisthesis and asymmetric advanced  facet arthropathy on the right with mild edema, potentially  contributing to patient's neck pain syndrome.   Extensive anterior cervical fusion spans C4 through C7.   Foramina at the fused C6/C7 level are not  well evaluated, possibly  moderately stenotic.   Moderate bilateral foraminal stenoses at C7/T1.   Central canal stenosis is most pronounced at C3/C4 where there is  minimal flattening of the ventral cord. No critical central canal  stenosis.  Physical Exam  General appearance: alert, cooperative, and in moderate distress Mental status: Alert, oriented x 3 (person, place, & time)       Respiratory: No evidence of acute respiratory distress Eyes:  PERLA Vitals: BP (!) 143/95   Pulse 99   Temp 98.5 F (36.9 C)   Ht '5\' 11"'  (1.803 m)   Wt 205 lb (93 kg)   SpO2 100%   BMI 28.59 kg/m  BMI: Estimated body mass index is 28.59 kg/m as calculated from the following:   Height as of this encounter: '5\' 11"'  (1.803 m).   Weight as of this encounter: 205 lb (93 kg). Ideal: Ideal body weight: 75.3 kg (166 lb 0.1 oz) Adjusted ideal body weight: 82.4 kg (181 lb 9.7 oz)  Cervical Spine Area Exam  Skin & Axial Inspection: No masses, redness, edema, swelling, or associated skin lesions Alignment: Symmetrical Functional ROM: Pain restricted ROM to the right      Stability: No instability detected Muscle Tone/Strength: Functionally intact. No obvious neuro-muscular anomalies detected. Sensory (Neurological): Neurogenic pain pattern right C2-C3 Palpation: No palpable anomalies              Right ear pain and right facial pain and numbness at times Assessment   Status Diagnosis  Worsening Persistent Controlled 1. Cervical radicular pain   2. S/P cervical spinal fusion   3. Pain management contract signed   4. Chronic pain syndrome      Updated Problems: Problem  Pain Management Contract Signed    Plan of Care   John Hartman has a current medication list which includes the following long-term medication(s): allopurinol, amlodipine, ezetimibe, nitroglycerin, pregabalin, hydralazine, metoprolol tartrate, and [DISCONTINUED] metformin.   1.  Right C2-C3 cervical radiculitis, I had ordered a cervical epidural steroid injection for the patient last week given increased pain at that time however this is been denied by insurance.  I have a peer to peer scheduled for tomorrow.  Hopefully we can get the patient in ASAP after it is approved. 2.  Given acute on chronic pain, patient requesting a small quantity of opioid analgesics.  UDS today.  Prescription for Percocet as below. 3.  Keep appointment with Dr. Cari Caraway,  neurosurgery  Pharmacotherapy (Medications Ordered): Meds ordered this encounter  Medications   oxyCODONE-acetaminophen (PERCOCET) 7.5-325 MG tablet    Sig: Take 1 tablet by mouth every 6 (six) hours as needed for severe pain.    Dispense:  60 tablet    Refill:  0   Orders:  Orders Placed This Encounter  Procedures   Compliance Drug Analysis, Ur    Volume: 30 ml(s). Minimum 3 ml of urine is needed. Document temperature of fresh sample. Indications: Long term (current) use of opiate analgesic (Q33.007) Test#: [phone] (Comprehensive Profile)    Order Specific Question:   Release to patient    Answer:   Immediate   Follow-up plan:   Return for For tentative C-ESI after peer-to-peer tomorrow.        Recent Visits Date Type Provider Dept  07/13/21 Telemedicine Gillis Santa, MD [facility]  04/20/21 Office Visit Gillis Santa, MD [facility]  Showing recent visits within past 90 days and meeting all other requirements Today's Visits Date Type  Provider Dept  07/19/21 Office Visit Gillis Santa, MD [facility]  Showing today's visits and meeting all other requirements Future Appointments No visits were found meeting these conditions. Showing future appointments within next 90 days and meeting all other requirements I discussed the assessment and treatment plan with the patient. The patient was provided an opportunity to ask questions and all were answered. The patient agreed with the plan and demonstrated an understanding of the instructions.  Patient advised to call back or seek an in-person evaluation if the symptoms or condition worsens.  Duration of encounter:33mnutes.  Note by: BGillis Santa MD Date: 07/19/2021; Time: 2:33 PM

## 2021-07-21 ENCOUNTER — Ambulatory Visit
Admission: RE | Admit: 2021-07-21 | Discharge: 2021-07-21 | Disposition: A | Discharge status: Home / Self Care | Payer: No Typology Code available for payment source | Source: Ambulatory Visit | Attending: Student in an Organized Health Care Education/Training Program | Admitting: Student in an Organized Health Care Education/Training Program

## 2021-07-21 ENCOUNTER — Ambulatory Visit (HOSPITAL_BASED_OUTPATIENT_CLINIC_OR_DEPARTMENT_OTHER)
Payer: No Typology Code available for payment source | Admitting: Student in an Organized Health Care Education/Training Program

## 2021-07-21 ENCOUNTER — Other Ambulatory Visit: Payer: Self-pay

## 2021-07-21 ENCOUNTER — Encounter: Payer: Self-pay | Admitting: Student in an Organized Health Care Education/Training Program

## 2021-07-21 DIAGNOSIS — M5412 Radiculopathy, cervical region: Secondary | ICD-10-CM | POA: Diagnosis not present

## 2021-07-21 DIAGNOSIS — G894 Chronic pain syndrome: Secondary | ICD-10-CM | POA: Diagnosis present

## 2021-07-21 MED ORDER — LIDOCAINE HCL 2 % IJ SOLN
INTRAMUSCULAR | Status: AC
  Filled 2021-07-21: qty 10

## 2021-07-21 MED ORDER — MIDAZOLAM HCL 5 MG/5ML IJ SOLN: 1.0000 mg | INTRAMUSCULAR | Status: DC | PRN

## 2021-07-21 MED ORDER — SODIUM CHLORIDE 0.9% FLUSH
1.0000 mL | Freq: Once | INTRAVENOUS | Status: AC
  Administered 2021-07-21: 1 mL

## 2021-07-21 MED ORDER — ROPIVACAINE HCL 2 MG/ML IJ SOLN
1.0000 mL | Freq: Once | INTRAMUSCULAR | Status: AC
  Administered 2021-07-21: 1 mL via EPIDURAL

## 2021-07-21 MED ORDER — LIDOCAINE HCL 2 % IJ SOLN
20.0000 mL | Freq: Once | INTRAMUSCULAR | Status: AC
  Administered 2021-07-21: 200 mg

## 2021-07-21 MED ORDER — DIAZEPAM 5 MG PO TABS
ORAL_TABLET | ORAL | Status: AC
  Filled 2021-07-21: qty 1

## 2021-07-21 MED ORDER — IOHEXOL 180 MG/ML  SOLN
10.0000 mL | Freq: Once | INTRAMUSCULAR | Status: AC
  Administered 2021-07-21: 5 mL via EPIDURAL

## 2021-07-21 MED ORDER — SODIUM CHLORIDE (PF) 0.9 % IJ SOLN
INTRAMUSCULAR | Status: AC
  Filled 2021-07-21: qty 10

## 2021-07-21 MED ORDER — FENTANYL CITRATE (PF) 100 MCG/2ML IJ SOLN: 25.0000 ug | INTRAMUSCULAR | Status: DC | PRN

## 2021-07-21 MED ORDER — DIAZEPAM 5 MG PO TABS
5.0000 mg | ORAL_TABLET | Freq: Once | ORAL | Status: AC
  Administered 2021-07-21: 5 mg via ORAL

## 2021-07-21 MED ORDER — DEXAMETHASONE SODIUM PHOSPHATE 10 MG/ML IJ SOLN
10.0000 mg | Freq: Once | INTRAMUSCULAR | Status: AC
  Administered 2021-07-21: 10 mg

## 2021-07-21 MED ORDER — DEXAMETHASONE SODIUM PHOSPHATE 10 MG/ML IJ SOLN
INTRAMUSCULAR | Status: AC
  Filled 2021-07-21: qty 1

## 2021-07-21 MED ORDER — ROPIVACAINE HCL 2 MG/ML IJ SOLN
INTRAMUSCULAR | Status: AC
  Filled 2021-07-21: qty 20

## 2021-07-21 NOTE — Progress Notes (Signed)
Safety precautions to be maintained throughout the outpatient stay will include: orient to surroundings, keep bed in low position, maintain call bell within reach at all times, provide assistance with transfer out of bed and ambulation.  

## 2021-07-21 NOTE — Progress Notes (Signed)
PROVIDER NOTE: Interpretation of information contained herein should be left to medically-trained personnel. Specific patient instructions are provided elsewhere under "Patient Instructions" section of medical record. This document was created in part using STT-dictation technology, any transcriptional errors that may result from this process are unintentional.  Patient: John Hartman Type: Established DOB: May 02, 1971 MRN: [id number] PCP: Juanda Bond, NP  Service: Procedure DOS: 07/21/2021 Setting: Ambulatory Location: Ambulatory outpatient facility Delivery: Face-to-face Provider: Gillis Santa, MD Specialty: Interventional Pain Management Specialty designation: 09 Location: Outpatient facility Ref. Prov.: Gillis Santa, MD   Procedure [facility] Interventional Pain Management )    Procedure: Interlaminar Cervical Epidural Steroid injection (ESI) Laterality: Right Level: T1-2 No.: 1 Series: 1 Purpose: Diagnostic/Therapeutic Indications: Cervicalgia, cervical radicular pain, degenerative disc disease, severe enough to greatly impact quality of life or function.   Imaging: Fluoroscopy-assisted Analgesia: Skin infiltration w/ local anesthetics Sedation: Minimal anxiolysis only. No sedation.  NAS-11 score:   Pre-procedure: 7 /10   Post-procedure: 5 /10      1. Cervical radicular pain   2. Chronic pain syndrome    Pre-Procedure Preparation  Monitoring: As per clinic protocol. Respiration, ETCO2, SpO2, BP, heart rate and rhythm monitor placed and checked for adequate function  Risk Assessment: Vitals:  [id number] body mass index is 28.59 kg/m as calculated from the following:   Height as of this encounter: '5\' 11"'$  (1.803 m).   Weight as of this encounter: 205 lb (93 kg)., Rate:61ECG Heart Rate: 65, BP:(!) 143/100, Resp:18, Temp:(!) 97.1 F (36.2 C), SpO2:99 %  Allergies: He is allergic to imdur [isosorbide nitrate], lisinopril, other, drug [tape], tapentadol, and ace inhibitors.   Precautions: None required  Blood-thinner(s): None at this time  Coagulopathies: Reviewed. None identified.   Active Infection(s): Reviewed. None identified. John Hartman is afebrile   Location setting: Procedure suite Position: Prone, on modified reverse trendelenburg to facilitate breathing, with head in head-cradle. Pillows positioned under chest (below chin-level) with cervical spine flexed. Safety Precautions: Patient was assessed for positional comfort and pressure points before starting the procedure. Prepping solution: DuraPrep (Iodine Povacrylex [0.7% available iodine] and Isopropyl Alcohol, 74% w/w) Prep Area: Entire  cervicothoracic region Approach: percutaneous, paramedial Intended target: Posterior cervical epidural space Materials: Tray: Epidural Needle(s): Epidural (Tuohy) Qty: 1 Length: (61m) 3.5-inch Gauge: 22G  3cc solution made of 1cc of preservative-free saline, 1cc of 0.2% ropivacaine, 1 cc of Decadron 10 mg/cc.    Meds ordered this encounter  Medications   iohexol (OMNIPAQUE) 180 MG/ML injection 10 mL    Must be Myelogram-compatible. If not available, you may substitute with a water-soluble, non-ionic, hypoallergenic, myelogram-compatible radiological contrast medium.   lidocaine (XYLOCAINE) 2 % (with pres) injection 400 mg   DISCONTD: midazolam (VERSED) 5 MG/5ML injection 1-2 mg    Make sure Flumazenil is available in the pyxis when using this medication. If oversedation occurs, administer 0.2 mg IV over 15 sec. If after 45 sec no response, administer 0.2 mg again over 1 min; may repeat at 1 min intervals; not to exceed 4 doses (1 mg)   DISCONTD: fentaNYL (SUBLIMAZE) injection 25-50 mcg    Make sure Narcan is available in the pyxis when using this medication. In the event of respiratory depression (RR< 8/min): Titrate NARCAN (naloxone) in increments of 0.1 to 0.2 mg IV at 2-3 minute intervals, until desired degree of reversal.   ropivacaine (PF) 2 mg/mL (0.2%)  (NAROPIN) injection 1 mL   sodium chloride flush (NS) 0.9 % injection 1 mL   dexamethasone (DECADRON) injection  10 mg   diazepam (VALIUM) tablet 5 mg    Orders Placed This Encounter  Procedures   DG PAIN CLINIC C-ARM 1-60 MIN NO REPORT    Intraoperative interpretation by procedural physician at [place].    Standing Status:   Standing    Number of Occurrences:   1    Order Specific Question:   Reason for exam:    Answer:   Assistance in needle guidance and placement for procedures requiring needle placement in or near specific anatomical locations not easily accessible without such assistance.     Time-out: 0920 I initiated and conducted the "Time-out" before starting the procedure, as per protocol. The patient was asked to participate by confirming the accuracy of the "Time Out" information. Verification of the correct person, site, and procedure were performed and confirmed by me, the nursing staff, and the patient. "Time-out" conducted as per Joint Commission's Universal Protocol (UP.01.01.01). Procedure checklist: Completed   H&P (Pre-op  Assessment)  John Hartman is a 50 y.o. (year old), male patient, seen today for interventional treatment. He  has a past surgical history that includes Shoulder surgery (Left, 2007); Nasal septum surgery (2004); ACDF with fusion (2007); Testicular torsion (1980s); Vasectomy; Colonoscopy with propofol (N/A, 06/26/2015); Cardiac catheterization (Left, 08/15/2016); Coronary/Graft Acute MI Revascularization (N/A, 08/23/2018); LEFT HEART CATH AND CORONARY ANGIOGRAPHY (N/A, 08/23/2018); Cystoscopy with biopsy (N/A, 11/22/2019); Cystoscopy/ureteroscopy/holmium laser/stent placement (Left, 11/22/2019); Shoulder arthroscopy w/ rotator cuff repair (10/01/2019); LEFT HEART CATH AND CORONARY ANGIOGRAPHY (N/A, 02/10/2020); Cystoscopy/ureteroscopy/holmium laser (02/10/2020); Cystoscopy w/ retrogrades (02/10/2020); and Extracorporeal shock wave lithotripsy (Left,  10/29/2020). John Hartman has a current medication list which includes the following prescription(s): allopurinol, amlodipine, aspirin ec, cetirizine, diphenhydramine, epinephrine, ezetimibe, famotidine, indomethacin, magnesium, nitroglycerin, onetouch delica lancets 0000000, onetouch ultra, oxycodone-acetaminophen, pregabalin, rybelsus, xigduo xr, hydralazine, metoprolol tartrate, and [DISCONTINUED] metformin. His primarily concern today is the Neck Pain  He is allergic to imdur [isosorbide nitrate], lisinopril, other, drug [tape], tapentadol, and ace inhibitors.   Last encounter: My last encounter with him was on 07/19/2021. Pertinent problems: John Hartman has Coronary artery disease; Chronic lower back pain; Nephrolithiasis; Chronic pain syndrome; Lumbar radiculopathy; Spinal stenosis of lumbar region with neurogenic claudication; S/P cervical spinal fusion; and Back muscle spasm on their pertinent problem list. Pain Assessment: Severity of Chronic pain is reported as a 7 /10. Location: Neck Posterior/Radiates from back of the neck into the right side of the face. Onset: More than a month ago. Quality: Constant, Shooting, Throbbing, Numbness, Tingling. Timing: Constant. Modifying factor(s): Denies. Vitals:  height is '5\' 11"'$  (1.803 m) and weight is 205 lb (93 kg). His temporal temperature is 97.1 F (36.2 C) (abnormal). His blood pressure is 133/89 and his pulse is 61. His respiration is 15 and oxygen saturation is 98%.   Reason for encounter: Interventional pain management therapy due pain of at least four (4) weeks in duration, with to failure to respond to and/or inability to tolerate more conservative care.     Narrative CLINICAL DATA:  Chronic cervicalgia  EXAM: CERVICAL SPINE COMPLETE WITH FLEXION AND EXTENSION VIEWS  COMPARISON:  None.  FINDINGS: Frontal, neutral lateral, flexion lateral, extension lateral, open-mouth odontoid, and bilateral oblique views obtained. There is no evident fracture.  There is no appreciable spondylolisthesis on neutral lateral imaging. There is no appreciable change in lateral alignment with flexion-extension.  The patient is status post anterior screw and plate fixation from [id number] with disc spacers noted at C4-5, C5-6, and C6-7. Support hardware appears intact at  all levels.  There is moderate disc space narrowing at C7-T1. No other disc space narrowing is appreciable. There is calcification in the anterior ligament at C3-4. There is mild facet hypertrophy on the right at C4-5 causing mild exit foraminal narrowing. No similar changes seen elsewhere in the cervical region. No erosive change. Lung apices are clear.  There is relative lack of lordosis on the neutral lateral imaging. There is mild calcification in the left carotid artery.  IMPRESSION: Postoperative change from C4-C7 with support hardware intact. Moderate disc space narrowing C7-T1. Calcification anterior ligament at C3-4. Mild exit foraminal narrowing due to bony hypertrophy on the right at C4-5.  No fracture. No spondylolisthesis. No change in lateral alignment with flexion-extension. Patient's ability to flex and extend appears somewhat limited. Lack of lordosis on neutral lateral imaging suggests underlying muscle spasm.  Mild calcification noted in left carotid artery.   Electronically Signed By: Lowella Grip III M.D. On: 01/02/2018 11:15  MRI CERVICAL SPINE WITHOUT CONTRAST   HISTORY: Evaluate cervical and lumbar radiculopathy, stenosis, DDD. Pt  with hx of DDD and stenosis. Pt reports cervical and lumbar pain x 4  plus years radiating into right side of neck, head, and into face.  Numbness radiating into left leg to knee. No surgery   COMPARISON: None   TECHNIQUE: Multiplanar, multi sequence imaging of the cervical spine  was performed without gadolinium.   FINDINGS:   Patient has had anterior cervical interbody fusion spanning C4-C7.  Straightening of  the fused segment. Trace anterolisthesis of C2 in  relation to C3. Vertebral body heights are maintained. No infiltrative  marrow replacement process. There may be mild marrow edema associated  with facet arthropathy on the right at C2/C3. Craniocervical junction  and posterior elements are intact. Prevertebral soft tissues are  normal. Incidentally imaged cervical soft tissues demonstrate no acute  abnormalities. Cervical spinal cord maintains normal caliber, signal  intensity and morphology throughout.   C2/C3: Trace anterolisthesis. Unroofing of the disc and mild bulging  of the disc. Mild ligament of flavum hypertrophy. Moderate right and  mild left foraminal stenosis primarily due to facet arthropathy, and  to a lesser degree, uncovertebral spurring. Asymmetric advanced facet  arthropathy on the right.   C3/C4: Mild bulging of the disc minimally contacts and flattens the  ventral cord. CSF dorsal to the cord is maintained. Mild to moderate  right and mild left foraminal stenosis due to uncovertebral spurring.   C4/C5: Fused segment. Central canal and left neural foramen are  patent. Right neural foramen is patent.   C5/C6: Fused segment. Central canal and foramina are patent.   The left C6/C7: Central canal and foramina are patent.   C6/C7: Fused segment. Central canal is patent. There may be at least  moderate bilateral foraminal stenoses due to endplate spurring and  facet arthropathy.   C7/T1: Central canal is patent. Moderate foraminal stenosis.   T1/T2: Mild bulging of the disc. Central canal foramina are otherwise  patent.   IMPRESSION:   Degenerative changes of the cervical spine are most pronounced at  C2/C3 where there is mild anterolisthesis and asymmetric advanced  facet arthropathy on the right with mild edema, potentially  contributing to patient's neck pain syndrome.   Extensive anterior cervical fusion spans C4 through C7.   Foramina at the fused C6/C7  level are not well evaluated, possibly  moderately stenotic.   Moderate bilateral foraminal stenoses at C7/T1.   Central canal stenosis is most pronounced at C3/C4  where there is  minimal flattening of the ventral cord. No critical central canal  stenosis.   Jed Limerick, MD - 07/07/2021  Formatting of this note might be different from the original.  MRI CERVICAL SPINE WITHOUT CONTRAST   HISTORY: Evaluate cervical and lumbar radiculopathy, stenosis, DDD. Pt  with hx of DDD and stenosis. Pt reports cervical and lumbar pain x 4  plus years radiating into right side of neck, head, and into face.  Numbness radiating into left leg to knee. No surgery   COMPARISON: None   TECHNIQUE: Multiplanar, multi sequence imaging of the cervical spine  was performed without gadolinium.   FINDINGS:   Patient has had anterior cervical interbody fusion spanning C4-C7.  Straightening of the fused segment. Trace anterolisthesis of C2 in  relation to C3. Vertebral body heights are maintained. No infiltrative  marrow replacement process. There may be mild marrow edema associated  with facet arthropathy on the right at C2/C3. Craniocervical junction  and posterior elements are intact. Prevertebral soft tissues are  normal. Incidentally imaged cervical soft tissues demonstrate no acute  abnormalities. Cervical spinal cord maintains normal caliber, signal  intensity and morphology throughout.   C2/C3: Trace anterolisthesis. Unroofing of the disc and mild bulging  of the disc. Mild ligament of flavum hypertrophy. Moderate right and  mild left foraminal stenosis primarily due to facet arthropathy, and  to a lesser degree, uncovertebral spurring. Asymmetric advanced facet  arthropathy on the right.   C3/C4: Mild bulging of the disc minimally contacts and flattens the  ventral cord. CSF dorsal to the cord is maintained. Mild to moderate  right and mild left foraminal stenosis due to uncovertebral  spurring.   C4/C5: Fused segment. Central canal and left neural foramen are  patent. Right neural foramen is patent.   C5/C6: Fused segment. Central canal and foramina are patent.   The left C6/C7: Central canal and foramina are patent.   C6/C7: Fused segment. Central canal is patent. There may be at least  moderate bilateral foraminal stenoses due to endplate spurring and  facet arthropathy.   C7/T1: Central canal is patent. Moderate foraminal stenosis.   T1/T2: Mild bulging of the disc. Central canal foramina are otherwise  patent.   IMPRESSION:   Degenerative changes of the cervical spine are most pronounced at  C2/C3 where there is mild anterolisthesis and asymmetric advanced  facet arthropathy on the right with mild edema, potentially  contributing to patient's neck pain syndrome.   Extensive anterior cervical fusion spans C4 through C7.   Foramina at the fused C6/C7 level are not well evaluated, possibly  moderately stenotic.   Moderate bilateral foraminal stenoses at C7/T1.   Central canal stenosis is most pronounced at C3/C4 where there is  minimal flattening of the ventral cord. No critical central canal  stenosis.     Site Confirmation: John Hartman was asked to confirm the procedure and laterality before marking the site.  Consent: Before the procedure and under the influence of no sedative(s), amnesic(s), or anxiolytics, the patient was informed of the treatment options, risks and possible complications. To fulfill our ethical and legal obligations, as recommended by the American Medical Association's Code of Ethics, I have informed the patient of my clinical impression; the nature and purpose of the treatment or procedure; the risks, benefits, and possible complications of the intervention; the alternatives, including doing nothing; the risk(s) and benefit(s) of the alternative treatment(s) or procedure(s); and the risk(s) and benefit(s) of doing nothing. The  patient was provided information about the general risks and possible complications associated with the procedure. These may include, but are not limited to: failure to achieve desired goals, infection, bleeding, organ or nerve damage, allergic reactions, paralysis, and death. In addition, the patient was informed of those risks and complications associated to Spine-related procedures, such as failure to decrease pain; infection (i.e.: Meningitis, epidural or intraspinal abscess); bleeding (i.e.: epidural hematoma, subarachnoid hemorrhage, or any other type of intraspinal or peri-dural bleeding); organ or nerve damage (i.e.: Any type of peripheral nerve, nerve root, or spinal cord injury) with subsequent damage to sensory, motor, and/or autonomic systems, resulting in permanent pain, numbness, and/or weakness of one or several areas of the body; allergic reactions; (i.e.: anaphylactic reaction); and/or death. Furthermore, the patient was informed of those risks and complications associated with the medications. These include, but are not limited to: allergic reactions (i.e.: anaphylactic or anaphylactoid reaction(s)); adrenal axis suppression; blood sugar elevation that in diabetics may result in ketoacidosis or comma; water retention that in patients with history of congestive heart failure may result in shortness of breath, pulmonary edema, and decompensation with resultant heart failure; weight gain; swelling or edema; medication-induced neural toxicity; particulate matter embolism and blood vessel occlusion with resultant organ, and/or nervous system infarction; and/or aseptic necrosis of one or more joints. Finally, the patient was informed that Medicine is not an exact science; therefore, there is also the possibility of unforeseen or unpredictable risks and/or possible complications that may result in a catastrophic outcome. The patient indicated having understood very clearly. We have given the patient no  guarantees and we have made no promises. Enough time was given to the patient to ask questions, all of which were answered to the patient's satisfaction. John Hartman has indicated that he wanted to continue with the procedure. Attestation: I, the ordering provider, attest that I have discussed with the patient the benefits, risks, side-effects, alternatives, likelihood of achieving goals, and potential problems during recovery for the procedure that I have provided informed consent.  Date  Time: 07/21/2021  8:46 AM    Description of procedure   Start Time: 0920 hrs  Local Anesthesia: Once the patient was positioned, prepped, and time-out was completed. The target area was identified located. The skin was marked with an approved surgical skin marker. Once marked, the skin (epidermis, dermis, and hypodermis), and deeper tissues (fat, connective tissue and muscle) were infiltrated with a small amount of a short-acting local anesthetic, loaded on a 10cc syringe with a 25G, 1.5-in  Needle. An appropriate amount of time was allowed for local anesthetics to take effect before proceeding to the next step. Local Anesthetic: Lidocaine 1-2% The unused portion of the local anesthetic was discarded in the proper designated containers. Safety Precautions: Aspiration looking for blood return was conducted prior to all injections. At no point did I inject any substances, as a needle was being advanced. Before injecting, the patient was told to immediately notify me if he was experiencing any new onset of "ringing in the ears, or metallic taste in the mouth". No attempts were made at seeking any paresthesias. Safe injection practices and needle disposal techniques used. Medications properly checked for expiration dates. SDV (single dose vial) medications used. After the completion of the procedure, all disposable equipment used was discarded in the proper designated medical waste containers.  Technical  description: Protocol guidelines were followed. Using fluoroscopic guidance, the epidural needle was introduced through the skin, ipsilateral to the reported pain, and advanced  to the target area. Posterior laminar os was contacted and the needle walked caudad, until the lamina was cleared. The ligamentum flavum was engaged and the epidural space identified using "loss-of-resistance technique" with 2-3 ml of PF-NaCl (0.9% NSS), in a 5cc dedicated LOR syringe. See "Imaging guidance" below for use of contrast details.  Injection: Once satisfactory needle placement was confirmed, I proceeded to inject the desired solution in slow, incremental fashion, intermittently assessing for discomfort or any signs of abnormal or undesired spread of substance. Once completed, the needle was removed and disposed of, as per hospital protocols.   Vitals:   07/21/21 0847 07/21/21 0918 07/21/21 0924 07/21/21 0927  BP: (!) 143/100 (!) 152/98 133/88 133/89  Pulse: 61     Resp: '18 18 17 15  '$ Temp: (!) 97.1 F (36.2 C)     TempSrc: Temporal     SpO2: 99% 98% 98% 98%  Weight: 205 lb (93 kg)     Height: '5\' 11"'$  (1.803 m)       End Time: 0927 hrs  Once the entire procedure was completed, the treated area was cleaned, making sure to leave some of the prepping solution back to take advantage of its long term bactericidal properties.   Imaging guidance  Type of Imaging Technique: Fluoroscopy Guidance (Spinal) Indication(s): Assistance in needle guidance and placement for procedures requiring needle placement in or near specific anatomical locations not easily accessible without such assistance. Exposure Time: Please see nurses notes for exact fluoroscopy time. Contrast: Before injecting any contrast, we confirmed that the patient did not have an allergy to iodine, shellfish, or radiological contrast. Once satisfactory needle placement was completed, radiological contrast was injected under continuous fluoroscopic  guidance. Injection of contrast accomplished without complications. See chart for type and volume of contrast used. Fluoroscopic Guidance: I was personally present in the fluoroscopy suite, where the patient was placed in position for the procedure, over the fluoroscopy-compatible table. Fluoroscopy was manipulated, using "Tunnel Vision Technique", to obtain the best possible view of the target area, on the affected side. Parallax error was corrected before commencing the procedure. A "direction-depth-direction" technique was used to introduce the needle under continuous pulsed fluoroscopic guidance. Once the target was reached, antero-posterior, oblique, and lateral fluoroscopic projection views were taken to confirm needle placement in all planes. Electronic images uploaded into EMR.  Interpretation: Successful epidural injection. Intraoperative imaging interpretation by performing Physician.    Post-op assessment  Post-procedure Vital Signs:  Pulse/HCG Rate: 6167 Temp: (!) 97.1 F (36.2 C)  Resp: 15 BP: 133/89  SpO2: 98 %  EBL: None  Complications: No immediate post-treatment complications observed by team, or reported by patient.  Note: The patient tolerated the entire procedure well. A repeat set of vitals were taken after the procedure and the patient was kept under observation following institutional policy, for this type of procedure. Post-procedural neurological assessment was performed, showing return to baseline, prior to discharge. The patient was provided with post-procedure discharge instructions, including a section on how to identify potential problems. Should any problems arise concerning this procedure, the patient was given instructions to immediately contact us, at any time, without hesitation. In any case, we plan to contact the patient by telephone for a follow-up status report regarding this interventional procedure.  Comments:  No additional relevant information.    Plan of care    Medications administered: We administered iohexol, lidocaine, ropivacaine (PF) 2 mg/mL (0.2%), sodium chloride flush, dexamethasone, and diazepam.  Follow-up plan:   Return in  about 3 weeks (around 08/11/2021) for Post Procedure Evaluation, virtual.         Recent Visits Date Type Provider Dept  07/19/21 Office Visit Gillis Santa, MD [facility]  07/13/21 Telemedicine Gillis Santa, MD [facility]  Showing recent visits within past 90 days and meeting all other requirements Today's Visits Date Type Provider Dept  07/21/21 Procedure visit Gillis Santa, MD [facility]  Showing today's visits and meeting all other requirements Future Appointments Date Type Provider Dept  08/11/21 Appointment Gillis Santa, MD [facility]  Showing future appointments within next 90 days and meeting all other requirements  Disposition: Discharge home  Discharge (Date  Time): 07/21/2021; 0940 hrs.   Primary Care Physician: Juanda Bond, NP Location: [facility] Note by: Gillis Santa, MD Date: 07/21/2021; Time: 9:35 AM  DISCLAIMER: Medicine is not an exact science. It has no guarantees or warranties. The decision to proceed with this intervention was based on the information collected from the patient. Conclusions were drawn from the patient's questionnaire, interview, and examination. Because information was provided in large part by the patient, it cannot be guaranteed that it has not been purposely or unconsciously manipulated or altered. Every effort has been made to obtain as much accurate, relevant, available data as possible. Always take into account that the treatment will also be dependent on availability of resources and existing treatment guidelines, considered by other Pain Management Specialists as being common knowledge and practice, at the time of the intervention. It is also important to point out that  variation in procedural techniques and pharmacological choices are the acceptable norm. For Medico-Legal review purposes, the indications, contraindications, technique, and results of the these procedures should only be evaluated, judged and interpreted by a Board-Certified Interventional Pain Specialist with extensive familiarity and expertise in the same exact procedure and technique.

## 2021-07-21 NOTE — Patient Instructions (Signed)
Pain Management Discharge Instructions  General Discharge Instructions :  If you need to reach your doctor call: Monday-Friday 8:00 am - 4:00 pm at [phone] or toll free [phone].  After clinic hours [phone] to have operator reach doctor.  Bring all of your medication bottles to all your appointments in the pain clinic.  To cancel or reschedule your appointment with Pain Management please remember to call 24 hours in advance to avoid a fee.  Refer to the educational materials which you have been given on: General Risks, I had my Procedure. Discharge Instructions, Post Sedation.  Post Procedure Instructions:  The drugs you were given will stay in your system until tomorrow, so for the next 24 hours you should not drive, make any legal decisions or drink any alcoholic beverages.  You may eat anything you prefer, but it is better to start with liquids then soups and crackers, and gradually work up to solid foods.  Please notify your doctor immediately if you have any unusual bleeding, trouble breathing or pain that is not related to your normal pain.  Depending on the type of procedure that was done, some parts of your body may feel week and/or numb.  This usually clears up by tonight or the next day.  Walk with the use of an assistive device or accompanied by an adult for the 24 hours.  You may use ice on the affected area for the first 24 hours.  Put ice in a Ziploc bag and cover with a towel and place against area 15 minutes on 15 minutes off.  You may switch to heat after 24 hours.Epidural Steroid Injection Patient Information  Description: The epidural space surrounds the nerves as they exit the spinal cord.  In some patients, the nerves can be compressed and inflamed by a bulging disc or a tight spinal canal (spinal stenosis).  By injecting steroids into the epidural space, we can bring irritated nerves into direct contact with a potentially helpful medication.  These  steroids act directly on the irritated nerves and can reduce swelling and inflammation which often leads to decreased pain.  Epidural steroids may be injected anywhere along the spine and from the neck to the low back depending upon the location of your pain.   After numbing the skin with local anesthetic (like Novocaine), a small needle is passed into the epidural space slowly.  You may experience a sensation of pressure while this is being done.  The entire block usually last less than 10 minutes.  Conditions which may be treated by epidural steroids:  Low back and leg pain Neck and arm pain Spinal stenosis Post-laminectomy syndrome Herpes zoster (shingles) pain Pain from compression fractures  Preparation for the injection:  Do not eat any solid food or dairy products within 8 hours of your appointment.  You may drink clear liquids up to 3 hours before appointment.  Clear liquids include water, black coffee, juice or soda.  No milk or cream please. You may take your regular medication, including pain medications, with a sip of water before your appointment  Diabetics should hold regular insulin (if taken separately) and take 1/2 normal NPH dos the morning of the procedure.  Carry some sugar containing items with you to your appointment. A driver must accompany you and be prepared to drive you home after your procedure.  Bring all your current medications with your. An IV may be inserted and sedation may be given at the discretion of the physician.     A blood pressure cuff, EKG and other monitors will often be applied during the procedure.  Some patients may need to have extra oxygen administered for a short period. You will be asked to provide medical information, including your allergies, prior to the procedure.  We must know immediately if you are taking blood thinners (like Coumadin/Warfarin)  Or if you are allergic to IV iodine contrast (dye). We must know if you could possible be  pregnant.  Possible side-effects: Bleeding from needle site Infection (rare, may require surgery) Nerve injury (rare) Numbness & tingling (temporary) Difficulty urinating (rare, temporary) Spinal headache ( a headache worse with upright posture) Light -headedness (temporary) Pain at injection site (several days) Decreased blood pressure (temporary) Weakness in arm/leg (temporary) Pressure sensation in back/neck (temporary)  Call if you experience: Fever/chills associated with headache or increased back/neck pain. Headache worsened by an upright position. New onset weakness or numbness of an extremity below the injection site Hives or difficulty breathing (go to the emergency room) Inflammation or drainage at the infection site Severe back/neck pain Any new symptoms which are concerning to you  Please note:  Although the local anesthetic injected can often make your back or neck feel good for several hours after the injection, the pain will likely return.  It takes 3-7 days for steroids to work in the epidural space.  You may not notice any pain relief for at least that one week.  If effective, we will often do a series of three injections spaced 3-6 weeks apart to maximally decrease your pain.  After the initial series, we generally will wait several months before considering a repeat injection of the same type.  If you have any questions, please call [phone] [facility] 

## 2021-07-22 ENCOUNTER — Telehealth: Payer: Self-pay | Admitting: *Deleted

## 2021-07-22 LAB — COMPLIANCE DRUG ANALYSIS, UR

## 2021-07-22 NOTE — Telephone Encounter (Signed)
States 80% pain reduction with procedure. No other issues.

## 2021-08-11 ENCOUNTER — Other Ambulatory Visit: Payer: Self-pay

## 2021-08-11 ENCOUNTER — Ambulatory Visit
Payer: No Typology Code available for payment source | Attending: Student in an Organized Health Care Education/Training Program | Admitting: Student in an Organized Health Care Education/Training Program

## 2021-08-11 DIAGNOSIS — G894 Chronic pain syndrome: Secondary | ICD-10-CM | POA: Diagnosis not present

## 2021-08-11 DIAGNOSIS — Z981 Arthrodesis status: Secondary | ICD-10-CM

## 2021-08-11 DIAGNOSIS — M5412 Radiculopathy, cervical region: Secondary | ICD-10-CM

## 2021-08-11 DIAGNOSIS — M5481 Occipital neuralgia: Secondary | ICD-10-CM | POA: Diagnosis not present

## 2021-08-11 NOTE — Progress Notes (Addendum)
Patient: John Hartman  Service Category: E/M  Provider: Gillis Santa, MD  DOB: 1971/02/23  DOS: 08/11/2021  Location: Office  MRN: [id number]  Setting: Ambulatory outpatient  Referring Provider: Juanda Bond, NP  Type: Established Patient  Specialty: Interventional Pain Management  PCP: Juanda Bond, NP  Location: Home  Delivery: TeleHealth     Virtual Encounter - Pain Management PROVIDER NOTE: Information contained herein reflects review and annotations entered in association with encounter. Interpretation of such information and data should be left to medically-trained personnel. Information provided to patient can be located elsewhere in the medical record under "Patient Instructions". Document created using STT-dictation technology, any transcriptional errors that may result from process are unintentional.    Contact & Pharmacy Preferred: [phone] Home: [phone] (home) Mobile: [phone] (mobile) E-mail: [email or web address]  [id number] [place] [address]Phone: [phone]: [address] [place]Phone: [phone]: [phone]  Pre-screening  Mr. Helzer offered "in-person" vs "virtual" encounter. He indicated preferring virtual for this encounter.   Reason COVID-19*  Social distancing based on CDC and AMA recommendations.   I contacted SAZIAH BROSTROMon 08/11/2021 via video conference.      I clearly identified myself as BGillis Santa MD. I verified that I was speaking with the correct person using two identifiers (Name: SANTARIO YASUDA and date of birth: [birth year].  Consent I sought verbal advanced consent from SOlena Materfor virtual visit interactions. I informed Mr. CSiracusaof possible security and privacy concerns, risks, and limitations associated with providing  "not-in-person" medical evaluation and management services. I also informed Mr. CSwindellof the availability of "in-person" appointments. Finally, I informed him that there would be a charge for the virtual visit and that he could be  personally, fully or partially, financially responsible for it. Mr. CGoodallexpressed understanding and agreed to proceed.   Historic Elements   Mr. SBREYTON VANSCYOCis a 50y.o. year old, male patient evaluated today after our last contact on 07/21/2021. Mr. CSoulier has a past medical history of Angioedema, Bronchitis, DDD (degenerative disc disease), cervical, Diabetes mellitus ([place], Fatty liver (03/31/2017), GERD (gastroesophageal reflux disease), Gout, Heart attack ([place], HTN (hypertension), Hypertension, IBS (irritable bowel syndrome), Kidney stone, LVH (left ventricular hypertrophy) (11/24/2018), Myocardial infarction ([place] (08/23/2018), Seasonal allergies, and Spinal stenosis. He also  has a past surgical history that includes Shoulder surgery (Left, 2007); Nasal septum surgery (2004); ACDF with fusion (2007); Testicular torsion (1980s); Vasectomy; Colonoscopy with propofol (N/A, 06/26/2015); Cardiac catheterization (Left, 08/15/2016); Coronary/Graft Acute MI Revascularization (N/A, 08/23/2018); LEFT HEART CATH AND CORONARY ANGIOGRAPHY (N/A, 08/23/2018); Cystoscopy with biopsy (N/A, 11/22/2019); Cystoscopy/ureteroscopy/holmium laser/stent placement (Left, 11/22/2019); Shoulder arthroscopy w/ rotator cuff repair (10/01/2019); LEFT HEART CATH AND CORONARY ANGIOGRAPHY (N/A, 02/10/2020); Cystoscopy/ureteroscopy/holmium laser (02/10/2020); Cystoscopy w/ retrogrades (02/10/2020); and Extracorporeal shock wave lithotripsy (Left, 10/29/2020). Mr. CMcphearsonhas a current medication list which includes the following prescription(s): trulicity, allopurinol, amlodipine, aspirin ec, cetirizine, diphenhydramine, epinephrine, ezetimibe, famotidine, hydralazine, indomethacin, magnesium, metoprolol tartrate,  nitroglycerin, onetouch delica lancets 363A onetouch ultra, oxycodone-acetaminophen, pregabalin, rybelsus, xigduo xr, and [DISCONTINUED] metformin. He  reports that he quit smoking about 18 years ago. His smoking use included cigarettes. He has a 10.00 pack-year smoking history. He has never used smokeless tobacco. He reports that he does not drink alcohol  and does not use drugs. Mr. Delossantos is allergic to imdur [isosorbide nitrate], lisinopril, other, drug [tape], tapentadol, and ace inhibitors.   HPI  Today, he is being contacted for a post-procedure assessment.   Post-Procedure Evaluation  Procedure (07/21/2021): RIGHT T1-T2 ESI  Anxiolysis: Please see nurses note.  Effectiveness during initial hour after procedure (Ultra-Short Term Relief): 90 %   Local anesthetic used: Long-acting (4-6 hours) Effectiveness: Defined as any analgesic benefit obtained secondary to the administration of local anesthetics. This carries significant diagnostic value as to the etiological location, or anatomical origin, of the pain. Duration of benefit is expected to coincide with the duration of the local anesthetic used.  Effectiveness during initial 4-6 hours after procedure (Short-Term Relief): 90 %   Long-term benefit: Defined as any relief past the pharmacologic duration of the local anesthetics.  Effectiveness past the initial 6 hours after procedure (Long-Term Relief): 90 % (for about a week and then it is gradually returning.)   Benefits, current: Defined as benefit present at the time of this evaluation.   Analgesia:  <50% better Function: Somewhat improved ROM: Somewhat improved   Laboratory Chemistry Profile   Renal Lab Results  Component Value Date   BUN 19 11/01/2020   CREATININE 1.27 (H) 11/01/2020   BCR NOT APPLICABLE 01/06/2020   GFRAA >60 06/17/2020   GFRNONAA >60 11/01/2020    Hepatic Lab Results  Component Value Date   AST 36 10/30/2020   ALT 57 (H) 10/30/2020   ALBUMIN 4.9  10/30/2020   ALKPHOS 93 10/30/2020   LIPASE 45 10/31/2020    Electrolytes Lab Results  Component Value Date   NA 132 (L) 11/01/2020   K 3.4 (L) 11/01/2020   CL 97 (L) 11/01/2020   CALCIUM 9.1 11/01/2020   MG 2.3 02/12/2020   PHOS 1.9 (L) 08/27/2016    Bone Lab Results  Component Value Date   VD25OH 30 08/04/2017    Inflammation (CRP: Acute Phase) (ESR: Chronic Phase) Lab Results  Component Value Date   LATICACIDVEN 1.5 10/29/2020         Note: Above Lab results reviewed.  Assessment  The primary encounter diagnosis was Cervical radicular pain. Diagnoses of S/P cervical spinal fusion, Chronic pain syndrome, and Occipital neuralgia of right side were also pertinent to this visit.  Plan of Care    Mr. Nikos R Wessner has a current medication list which includes the following long-term medication(s): allopurinol, amlodipine, ezetimibe, hydralazine, metoprolol tartrate, nitroglycerin, pregabalin, and [DISCONTINUED] metformin.   States states that he has noticed some pain relief as well as improvement in his neck range of motion since his first cervical epidural steroid injection.  He continues to have persistent neck pain as well as lateral occiput pain related to right C2-C3 foraminal stenosis.  He has an upcoming appointment with Dr. Yarborough tomorrow.  Given that the patient had a positive response with his first cervical epidural steroid injection, we discussed repeating a second one and adding additional volume and seeing if that prolongs duration of benefit.  Patient is in agreement with plan.  Orders:  Orders Placed This Encounter  Procedures   Cervical Epidural Injection    Level(s): C7-T1 Laterality: TBD Purpose: Diagnostic/Therapeutic Indication(s): Radiculitis and cervicalgia associater with cervical degenerative disc disease.    Standing Status:   Future    Standing Expiration Date:   09/11/2021    Scheduling Instructions:     Procedure: Cervical Epidural  Steroid Injection/Block     Sedation: Patient's choice.       Timeframe: As soon as schedule allows    Order Specific Question:   Where will this procedure be performed?    Answer:   [facility]    Comments:   [place]   GREATER OCCIPITAL NERVE BLOCK    Standing Status:   Future    Standing Expiration Date:   11/12/2021    Scheduling Instructions:     Procedure: Occipital nerve block     Laterality: RIGHT     Sedation: Patient's choice.     Timeframe: ASAA    Order Specific Question:   Where will this procedure be performed?    Answer:   [facility]    Addendum 08/12/2021.  I was able to confer with Dr. Cari Caraway with neurosurgery at 9:45 AM.  He saw Danny Lawless and recommended physical therapy and Aleve.  We also discussed right occipital nerve block for right-sided occipital neuralgia.  I agree with this therapeutic plan and we will plan on doing this when we do his T1-T2 ESI #2.  Follow-up plan:   Return in about 3 weeks (around 09/01/2021) for T1-T2 ESI #2 , minimal sedation (PO Valium).     Right T1-T2 ESI 07/21/20  Recent Visits Date Type Provider Dept  08/11/21 Telemedicine Gillis Santa, MD [facility]  07/21/21 Procedure visit Gillis Santa, MD [facility]  07/19/21 Office Visit Gillis Santa, MD [facility]  07/13/21 Telemedicine Gillis Santa, MD [facility]  Showing recent visits within past 90 days and meeting all other requirements Future Appointments No visits were found meeting these conditions. Showing future appointments within next 90 days and meeting all other requirements I discussed the assessment and treatment plan with the patient. The patient was provided an opportunity to ask questions and all were answered. The patient agreed with the plan and demonstrated an understanding of the instructions.  Patient advised to call back or seek an in-person evaluation if the symptoms or condition worsens.  Duration  of encounter: 56mnutes.  Note by: BGillis Santa MD Date: 08/11/2021; Time: 9:48 AM

## 2021-08-12 NOTE — Addendum Note (Signed)
Addended by: Gillis Santa on: 08/12/2021 09:48 AM   Modules accepted: Orders

## 2021-08-25 ENCOUNTER — Encounter: Payer: Self-pay | Admitting: Student in an Organized Health Care Education/Training Program

## 2021-09-01 ENCOUNTER — Encounter: Payer: Self-pay | Admitting: Student in an Organized Health Care Education/Training Program

## 2021-09-01 ENCOUNTER — Ambulatory Visit
Admission: RE | Admit: 2021-09-01 | Discharge: 2021-09-01 | Disposition: A | Discharge status: Home / Self Care | Payer: No Typology Code available for payment source | Source: Ambulatory Visit | Attending: Student in an Organized Health Care Education/Training Program | Admitting: Student in an Organized Health Care Education/Training Program

## 2021-09-01 ENCOUNTER — Ambulatory Visit (HOSPITAL_BASED_OUTPATIENT_CLINIC_OR_DEPARTMENT_OTHER)
Payer: No Typology Code available for payment source | Admitting: Student in an Organized Health Care Education/Training Program

## 2021-09-01 ENCOUNTER — Other Ambulatory Visit: Payer: Self-pay

## 2021-09-01 VITALS — BP 169/115 | HR 83 | Temp 97.0°F | Resp 22 | Ht 71.0 in | Wt 204.0 lb

## 2021-09-01 DIAGNOSIS — M5412 Radiculopathy, cervical region: Secondary | ICD-10-CM | POA: Diagnosis not present

## 2021-09-01 DIAGNOSIS — M5481 Occipital neuralgia: Secondary | ICD-10-CM | POA: Insufficient documentation

## 2021-09-01 DIAGNOSIS — G894 Chronic pain syndrome: Secondary | ICD-10-CM

## 2021-09-01 MED ORDER — IOHEXOL 180 MG/ML  SOLN
INTRAMUSCULAR | Status: AC
  Filled 2021-09-01: qty 20

## 2021-09-01 MED ORDER — LIDOCAINE HCL (PF) 2 % IJ SOLN
INTRAMUSCULAR | Status: AC
  Filled 2021-09-01: qty 10

## 2021-09-01 MED ORDER — DEXAMETHASONE SODIUM PHOSPHATE 10 MG/ML IJ SOLN
10.0000 mg | Freq: Once | INTRAMUSCULAR | Status: AC
  Administered 2021-09-01: 10 mg

## 2021-09-01 MED ORDER — IOHEXOL 180 MG/ML  SOLN
10.0000 mL | Freq: Once | INTRAMUSCULAR | Status: AC
  Administered 2021-09-01: 10 mL via EPIDURAL

## 2021-09-01 MED ORDER — DIAZEPAM 5 MG PO TABS
ORAL_TABLET | ORAL | Status: AC
  Filled 2021-09-01: qty 1

## 2021-09-01 MED ORDER — ROPIVACAINE HCL 2 MG/ML IJ SOLN
INTRAMUSCULAR | Status: AC
  Filled 2021-09-01: qty 20

## 2021-09-01 MED ORDER — SODIUM CHLORIDE (PF) 0.9 % IJ SOLN
INTRAMUSCULAR | Status: AC
  Filled 2021-09-01: qty 10

## 2021-09-01 MED ORDER — SODIUM CHLORIDE 0.9% FLUSH
1.0000 mL | Freq: Once | INTRAVENOUS | Status: AC
  Administered 2021-09-01: 1 mL

## 2021-09-01 MED ORDER — DEXAMETHASONE SODIUM PHOSPHATE 10 MG/ML IJ SOLN
INTRAMUSCULAR | Status: AC
  Filled 2021-09-01: qty 1

## 2021-09-01 MED ORDER — ROPIVACAINE HCL 2 MG/ML IJ SOLN
1.0000 mL | Freq: Once | INTRAMUSCULAR | Status: AC
  Administered 2021-09-01: 1 mL via EPIDURAL

## 2021-09-01 MED ORDER — DIAZEPAM 5 MG PO TABS
5.0000 mg | ORAL_TABLET | ORAL | Status: AC
  Administered 2021-09-01: 5 mg via ORAL

## 2021-09-01 MED ORDER — LIDOCAINE HCL 2 % IJ SOLN
20.0000 mL | Freq: Once | INTRAMUSCULAR | Status: AC
  Administered 2021-09-01: 200 mg

## 2021-09-01 NOTE — Patient Instructions (Signed)

## 2021-09-01 NOTE — Progress Notes (Signed)
Safety precautions to be maintained throughout the outpatient stay will include: orient to surroundings, keep bed in low position, maintain call bell within reach at all times, provide assistance with transfer out of bed and ambulation.  

## 2021-09-01 NOTE — Progress Notes (Signed)
PROVIDER NOTE: Interpretation of information contained herein should be left to medically-trained personnel. Specific patient instructions are provided elsewhere under "Patient Instructions" section of medical record. This document was created in part using STT-dictation technology, any transcriptional errors that may result from this process are unintentional.  Patient: John Hartman Type: Established DOB: [birth year] MRN: [id number] PCP: Juanda Bond, NP  Service: Procedure DOS: 09/01/2021 Setting: Ambulatory Location: Ambulatory outpatient facility Delivery: Face-to-face Provider: Gillis Santa, MD Specialty: Interventional Pain Management Specialty designation: 09 Location: Outpatient facility Ref. Prov.: Gillis Santa, MD   Procedure [facility] Interventional Pain Management )    Procedure: Interlaminar Cervical Epidural Steroid injection (ESI) Laterality: Right Level: T1-2 No.: 2 Series: 1 Purpose: Diagnostic/Therapeutic Indications: Cervicalgia, cervical radicular pain, degenerative disc disease, severe enough to greatly impact quality of life or function.   Imaging: Fluoroscopy-assisted Analgesia: Skin infiltration w/ local anesthetics Sedation: Minimal anxiolysis only. No sedation.  NAS-11 score:   Pre-procedure: 8 /10   Post-procedure: 0-No pain (moving neck and putting shirt on)/10      1. Cervical radicular pain   2. Occipital neuralgia of right side   3. Chronic pain syndrome    Pre-Procedure Preparation  Monitoring: As per clinic protocol. Respiration, ETCO2, SpO2, BP, heart rate and rhythm monitor placed and checked for adequate function  Risk Assessment: Vitals:  [id number] body mass index is 28.45 kg/m as calculated from the following:   Height as of this encounter: '5\' 11"'$  (1.803 m).   Weight as of this encounter: 204 lb (92.5 kg)., Rate:86 , BP:(!) 157/117, Resp:18, Temp:(!) 97 F (36.1 C), SpO2:98 %  Allergies: He is allergic to imdur [isosorbide nitrate],  lisinopril, other, drug [tape], tapentadol, and ace inhibitors.  Precautions: None required  Blood-thinner(s): None at this time  Coagulopathies: Reviewed. None identified.   Active Infection(s): Reviewed. None identified. [name] is afebrile   Location setting: Procedure suite Position: Prone, on modified reverse trendelenburg to facilitate breathing, with head in head-cradle. Pillows positioned under chest (below chin-level) with cervical spine flexed. Safety Precautions: Patient was assessed for positional comfort and pressure points before starting the procedure. Prepping solution: DuraPrep (Iodine Povacrylex [0.7% available iodine] and Isopropyl Alcohol, 74% w/w) Prep Area: Entire  cervicothoracic region Approach: percutaneous, paramedial Intended target: Posterior cervical epidural space Materials: Tray: Epidural Needle(s): Epidural (Tuohy) Qty: 1 Length: (72m) 3.5-inch Gauge: 22G  4cc solution made of 2cc of preservative-free saline, 1cc of 0.2% ropivacaine, 1 cc of Decadron 10 mg/cc.    Meds ordered this encounter  Medications   iohexol (OMNIPAQUE) 180 MG/ML injection 10 mL    Must be Myelogram-compatible. If not available, you may substitute with a water-soluble, non-ionic, hypoallergenic, myelogram-compatible radiological contrast medium.   lidocaine (XYLOCAINE) 2 % (with pres) injection 400 mg   diazepam (VALIUM) tablet 5 mg    Make sure Flumazenil is available in the pyxis when using this medication. If oversedation occurs, administer 0.2 mg IV over 15 sec. If after 45 sec no response, administer 0.2 mg again over 1 min; may repeat at 1 min intervals; not to exceed 4 doses (1 mg)   ropivacaine (PF) 2 mg/mL (0.2%) (NAROPIN) injection 1 mL   sodium chloride flush (NS) 0.9 % injection 1 mL   dexamethasone (DECADRON) injection 10 mg   dexamethasone (DECADRON) injection 10 mg    Orders Placed This Encounter  Procedures   DG PAIN CLINIC C-ARM 1-60 MIN NO REPORT     Intraoperative interpretation by procedural physician at [facility]  Standing Status:   Standing    Number of Occurrences:   1    Order Specific Question:   Reason for exam:    Answer:   Assistance in needle guidance and placement for procedures requiring needle placement in or near specific anatomical locations not easily accessible without such assistance.     Time-out: 1402 I initiated and conducted the "Time-out" before starting the procedure, as per protocol. The patient was asked to participate by confirming the accuracy of the "Time Out" information. Verification of the correct person, site, and procedure were performed and confirmed by me, the nursing staff, and the patient. "Time-out" conducted as per Joint Commission's Universal Protocol (UP.01.01.01). Procedure checklist: Completed   H&P (Pre-op  Assessment)  John Hartman is a 50 y.o. (year old), male patient, seen today for interventional treatment. He  has a past surgical history that includes Shoulder surgery (Left, 2007); Nasal septum surgery (2004); ACDF with fusion (2007); Testicular torsion (1980s); Vasectomy; Colonoscopy with propofol (N/A, 06/26/2015); Cardiac catheterization (Left, 08/15/2016); Coronary/Graft Acute MI Revascularization (N/A, 08/23/2018); LEFT HEART CATH AND CORONARY ANGIOGRAPHY (N/A, 08/23/2018); Cystoscopy with biopsy (N/A, 11/22/2019); Cystoscopy/ureteroscopy/holmium laser/stent placement (Left, 11/22/2019); Shoulder arthroscopy w/ rotator cuff repair (10/01/2019); LEFT HEART CATH AND CORONARY ANGIOGRAPHY (N/A, 02/10/2020); Cystoscopy/ureteroscopy/holmium laser (02/10/2020); Cystoscopy w/ retrogrades (02/10/2020); and Extracorporeal shock wave lithotripsy (Left, 10/29/2020). John Hartman has a current medication list which includes the following prescription(s): allopurinol, amlodipine, aspirin ec, atorvastatin, cetirizine, diphenhydramine, trulicity, epinephrine, ezetimibe, famotidine, indomethacin, magnesium,  nitroglycerin, onetouch delica lancets 0000000, onetouch ultra, pregabalin, xigduo xr, hydralazine, metoprolol tartrate, rybelsus, and [DISCONTINUED] metformin. His primarily concern today is the Neck Pain (Right side)  He is allergic to imdur [isosorbide nitrate], lisinopril, other, drug [tape], tapentadol, and ace inhibitors.   Last encounter: My last encounter with him was on 07/19/2021. Pertinent problems: Mr. Fithen has Coronary artery disease; Chronic lower back pain; Nephrolithiasis; Chronic pain syndrome; Lumbar radiculopathy; Spinal stenosis of lumbar region with neurogenic claudication; S/P cervical spinal fusion; and Back muscle spasm on their pertinent problem list. Pain Assessment: Severity of Chronic pain is reported as a 8 /10. Location: Neck Right/radiates up the back of right neck. Onset: More than a month ago. Quality: Constant, Shooting, Throbbing (pulling at the base of skull). Timing: Constant. Modifying factor(s): injection. Vitals:  height is '5\' 11"'$  (1.803 m) and weight is 204 lb (92.5 kg). His temperature is 97 F (36.1 C) (abnormal). His blood pressure is 169/115 (abnormal) and his pulse is 83. His respiration is 22 (abnormal) and oxygen saturation is 98%.   Reason for encounter: Interventional pain management therapy due pain of at least four (4) weeks in duration, with to failure to respond to and/or inability to tolerate more conservative care.     Narrative CLINICAL DATA:  Chronic cervicalgia  EXAM: CERVICAL SPINE COMPLETE WITH FLEXION AND EXTENSION VIEWS  COMPARISON:  None.  FINDINGS: Frontal, neutral lateral, flexion lateral, extension lateral, open-mouth odontoid, and bilateral oblique views obtained. There is no evident fracture. There is no appreciable spondylolisthesis on neutral lateral imaging. There is no appreciable change in lateral alignment with flexion-extension.  The patient is status post anterior screw and plate fixation from [id number] with disc  spacers noted at C4-5, C5-6, and C6-7. Support hardware appears intact at all levels.  There is moderate disc space narrowing at C7-T1. No other disc space narrowing is appreciable. There is calcification in the anterior ligament at C3-4. There is mild facet hypertrophy on the right at C4-5 causing mild exit foraminal  narrowing. No similar changes seen elsewhere in the cervical region. No erosive change. Lung apices are clear.  There is relative lack of lordosis on the neutral lateral imaging. There is mild calcification in the left carotid artery.  IMPRESSION: Postoperative change from C4-C7 with support hardware intact. Moderate disc space narrowing C7-T1. Calcification anterior ligament at C3-4. Mild exit foraminal narrowing due to bony hypertrophy on the right at C4-5.  No fracture. No spondylolisthesis. No change in lateral alignment with flexion-extension. Patient's ability to flex and extend appears somewhat limited. Lack of lordosis on neutral lateral imaging suggests underlying muscle spasm.  Mild calcification noted in left carotid artery.   Electronically Signed By: Lowella Grip III M.D. On: 01/02/2018 11:15  MRI CERVICAL SPINE WITHOUT CONTRAST   HISTORY: Evaluate cervical and lumbar radiculopathy, stenosis, DDD. Pt  with hx of DDD and stenosis. Pt reports cervical and lumbar pain x 4  plus years radiating into right side of neck, head, and into face.  Numbness radiating into left leg to knee. No surgery   COMPARISON: None   TECHNIQUE: Multiplanar, multi sequence imaging of the cervical spine  was performed without gadolinium.   FINDINGS:   Patient has had anterior cervical interbody fusion spanning C4-C7.  Straightening of the fused segment. Trace anterolisthesis of C2 in  relation to C3. Vertebral body heights are maintained. No infiltrative  marrow replacement process. There may be mild marrow edema associated  with facet arthropathy on the right at  C2/C3. Craniocervical junction  and posterior elements are intact. Prevertebral soft tissues are  normal. Incidentally imaged cervical soft tissues demonstrate no acute  abnormalities. Cervical spinal cord maintains normal caliber, signal  intensity and morphology throughout.   C2/C3: Trace anterolisthesis. Unroofing of the disc and mild bulging  of the disc. Mild ligament of flavum hypertrophy. Moderate right and  mild left foraminal stenosis primarily due to facet arthropathy, and  to a lesser degree, uncovertebral spurring. Asymmetric advanced facet  arthropathy on the right.   C3/C4: Mild bulging of the disc minimally contacts and flattens the  ventral cord. CSF dorsal to the cord is maintained. Mild to moderate  right and mild left foraminal stenosis due to uncovertebral spurring.   C4/C5: Fused segment. Central canal and left neural foramen are  patent. Right neural foramen is patent.   C5/C6: Fused segment. Central canal and foramina are patent.   The left C6/C7: Central canal and foramina are patent.   C6/C7: Fused segment. Central canal is patent. There may be at least  moderate bilateral foraminal stenoses due to endplate spurring and  facet arthropathy.   C7/T1: Central canal is patent. Moderate foraminal stenosis.   T1/T2: Mild bulging of the disc. Central canal foramina are otherwise  patent.   IMPRESSION:   Degenerative changes of the cervical spine are most pronounced at  C2/C3 where there is mild anterolisthesis and asymmetric advanced  facet arthropathy on the right with mild edema, potentially  contributing to patient's neck pain syndrome.   Extensive anterior cervical fusion spans C4 through C7.   Foramina at the fused C6/C7 level are not well evaluated, possibly  moderately stenotic.   Moderate bilateral foraminal stenoses at C7/T1.   Central canal stenosis is most pronounced at C3/C4 where there is  minimal flattening of the ventral cord. No  critical central canal  stenosis.   Jed Limerick, MD - 07/07/2021  Formatting of this note might be different from the original.  MRI CERVICAL SPINE WITHOUT CONTRAST  HISTORY: Evaluate cervical and lumbar radiculopathy, stenosis, DDD. Pt  with hx of DDD and stenosis. Pt reports cervical and lumbar pain x 4  plus years radiating into right side of neck, head, and into face.  Numbness radiating into left leg to knee. No surgery   COMPARISON: None   TECHNIQUE: Multiplanar, multi sequence imaging of the cervical spine  was performed without gadolinium.   FINDINGS:   Patient has had anterior cervical interbody fusion spanning C4-C7.  Straightening of the fused segment. Trace anterolisthesis of C2 in  relation to C3. Vertebral body heights are maintained. No infiltrative  marrow replacement process. There may be mild marrow edema associated  with facet arthropathy on the right at C2/C3. Craniocervical junction  and posterior elements are intact. Prevertebral soft tissues are  normal. Incidentally imaged cervical soft tissues demonstrate no acute  abnormalities. Cervical spinal cord maintains normal caliber, signal  intensity and morphology throughout.   C2/C3: Trace anterolisthesis. Unroofing of the disc and mild bulging  of the disc. Mild ligament of flavum hypertrophy. Moderate right and  mild left foraminal stenosis primarily due to facet arthropathy, and  to a lesser degree, uncovertebral spurring. Asymmetric advanced facet  arthropathy on the right.   C3/C4: Mild bulging of the disc minimally contacts and flattens the  ventral cord. CSF dorsal to the cord is maintained. Mild to moderate  right and mild left foraminal stenosis due to uncovertebral spurring.   C4/C5: Fused segment. Central canal and left neural foramen are  patent. Right neural foramen is patent.   C5/C6: Fused segment. Central canal and foramina are patent.   The left C6/C7: Central canal and  foramina are patent.   C6/C7: Fused segment. Central canal is patent. There may be at least  moderate bilateral foraminal stenoses due to endplate spurring and  facet arthropathy.   C7/T1: Central canal is patent. Moderate foraminal stenosis.   T1/T2: Mild bulging of the disc. Central canal foramina are otherwise  patent.   IMPRESSION:   Degenerative changes of the cervical spine are most pronounced at  C2/C3 where there is mild anterolisthesis and asymmetric advanced  facet arthropathy on the right with mild edema, potentially  contributing to patient's neck pain syndrome.   Extensive anterior cervical fusion spans C4 through C7.   Foramina at the fused C6/C7 level are not well evaluated, possibly  moderately stenotic.   Moderate bilateral foraminal stenoses at C7/T1.   Central canal stenosis is most pronounced at C3/C4 where there is  minimal flattening of the ventral cord. No critical central canal  stenosis.     Site Confirmation: Mr. Friedel was asked to confirm the procedure and laterality before marking the site.  Consent: Before the procedure and under the influence of no sedative(s), amnesic(s), or anxiolytics, the patient was informed of the treatment options, risks and possible complications. To fulfill our ethical and legal obligations, as recommended by the American Medical Association's Code of Ethics, I have informed the patient of my clinical impression; the nature and purpose of the treatment or procedure; the risks, benefits, and possible complications of the intervention; the alternatives, including doing nothing; the risk(s) and benefit(s) of the alternative treatment(s) or procedure(s); and the risk(s) and benefit(s) of doing nothing. The patient was provided information about the general risks and possible complications associated with the procedure. These may include, but are not limited to: failure to achieve desired goals, infection, bleeding, organ or nerve  damage, allergic reactions, paralysis, and death. In addition, the  patient was informed of those risks and complications associated to Spine-related procedures, such as failure to decrease pain; infection (i.e.: Meningitis, epidural or intraspinal abscess); bleeding (i.e.: epidural hematoma, subarachnoid hemorrhage, or any other type of intraspinal or peri-dural bleeding); organ or nerve damage (i.e.: Any type of peripheral nerve, nerve root, or spinal cord injury) with subsequent damage to sensory, motor, and/or autonomic systems, resulting in permanent pain, numbness, and/or weakness of one or several areas of the body; allergic reactions; (i.e.: anaphylactic reaction); and/or death. Furthermore, the patient was informed of those risks and complications associated with the medications. These include, but are not limited to: allergic reactions (i.e.: anaphylactic or anaphylactoid reaction(s)); adrenal axis suppression; blood sugar elevation that in diabetics may result in ketoacidosis or comma; water retention that in patients with history of congestive heart failure may result in shortness of breath, pulmonary edema, and decompensation with resultant heart failure; weight gain; swelling or edema; medication-induced neural toxicity; particulate matter embolism and blood vessel occlusion with resultant organ, and/or nervous system infarction; and/or aseptic necrosis of one or more joints. Finally, the patient was informed that Medicine is not an exact science; therefore, there is also the possibility of unforeseen or unpredictable risks and/or possible complications that may result in a catastrophic outcome. The patient indicated having understood very clearly. We have given the patient no guarantees and we have made no promises. Enough time was given to the patient to ask questions, all of which were answered to the patient's satisfaction. Mr. Stockley has indicated that he wanted to continue with the  procedure. Attestation: I, the ordering provider, attest that I have discussed with the patient the benefits, risks, side-effects, alternatives, likelihood of achieving goals, and potential problems during recovery for the procedure that I have provided informed consent.  Date  Time: 09/01/2021  1:24 PM    Description of procedure   Start Time: 1403 hrs  Local Anesthesia: Once the patient was positioned, prepped, and time-out was completed. The target area was identified located. The skin was marked with an approved surgical skin marker. Once marked, the skin (epidermis, dermis, and hypodermis), and deeper tissues (fat, connective tissue and muscle) were infiltrated with a small amount of a short-acting local anesthetic, loaded on a 10cc syringe with a 25G, 1.5-in  Needle. An appropriate amount of time was allowed for local anesthetics to take effect before proceeding to the next step. Local Anesthetic: Lidocaine 1-2% The unused portion of the local anesthetic was discarded in the proper designated containers. Safety Precautions: Aspiration looking for blood return was conducted prior to all injections. At no point did I inject any substances, as a needle was being advanced. Before injecting, the patient was told to immediately notify me if he was experiencing any new onset of "ringing in the ears, or metallic taste in the mouth". No attempts were made at seeking any paresthesias. Safe injection practices and needle disposal techniques used. Medications properly checked for expiration dates. SDV (single dose vial) medications used. After the completion of the procedure, all disposable equipment used was discarded in the proper designated medical waste containers.  Technical description: Protocol guidelines were followed. Using fluoroscopic guidance, the epidural needle was introduced through the skin, ipsilateral to the reported pain, and advanced to the target area. Posterior laminar os was contacted  and the needle walked caudad, until the lamina was cleared. The ligamentum flavum was engaged and the epidural space identified using "loss-of-resistance technique" with 2-3 ml of PF-NaCl (0.9% NSS), in a 5cc  dedicated LOR syringe. See "Imaging guidance" below for use of contrast details.  Injection: Once satisfactory needle placement was confirmed, I proceeded to inject the desired solution in slow, incremental fashion, intermittently assessing for discomfort or any signs of abnormal or undesired spread of substance. Once completed, the needle was removed and disposed of, as per hospital protocols.   Vitals:   09/01/21 1358 09/01/21 1403 09/01/21 1408 09/01/21 1413  BP: (!) 145/110 (!) 143/116 (!) 149/114 (!) 169/115  Pulse: 90 86 85 83  Resp: '15 19 18 '$ (!) 22  Temp:      SpO2: 97% 96% 97% 98%  Weight:      Height:        End Time: 1413 hrs  Once the entire procedure was completed, the treated area was cleaned, making sure to leave some of the prepping solution back to take advantage of its long term bactericidal properties.   Imaging guidance  Type of Imaging Technique: Fluoroscopy Guidance (Spinal) Indication(s): Assistance in needle guidance and placement for procedures requiring needle placement in or near specific anatomical locations not easily accessible without such assistance. Exposure Time: Please see nurses notes for exact fluoroscopy time. Contrast: Before injecting any contrast, we confirmed that the patient did not have an allergy to iodine, shellfish, or radiological contrast. Once satisfactory needle placement was completed, radiological contrast was injected under continuous fluoroscopic guidance. Injection of contrast accomplished without complications. See chart for type and volume of contrast used. Fluoroscopic Guidance: I was personally present in the fluoroscopy suite, where the patient was placed in position for the procedure, over the fluoroscopy-compatible table.  Fluoroscopy was manipulated, using "Tunnel Vision Technique", to obtain the best possible view of the target area, on the affected side. Parallax error was corrected before commencing the procedure. A "direction-depth-direction" technique was used to introduce the needle under continuous pulsed fluoroscopic guidance. Once the target was reached, antero-posterior, oblique, and lateral fluoroscopic projection views were taken to confirm needle placement in all planes. Electronic images uploaded into EMR.  Interpretation: Successful epidural injection. Intraoperative imaging interpretation by performing Physician.    Post-op assessment  Post-procedure Vital Signs:  Pulse/HCG Rate: 83 (nsr)  Temp: (!) 97 F (36.1 C)  Resp: (!) 22 BP: (!) 169/115  SpO2: 98 %  EBL: None  Complications: No immediate post-treatment complications observed by team, or reported by patient.  Note: The patient tolerated the entire procedure well. A repeat set of vitals were taken after the procedure and the patient was kept under observation following institutional policy, for this type of procedure. Post-procedural neurological assessment was performed, showing return to baseline, prior to discharge. The patient was provided with post-procedure discharge instructions, including a section on how to identify potential problems. Should any problems arise concerning this procedure, the patient was given instructions to immediately contact us, at any time, without hesitation. In any case, we plan to contact the patient by telephone for a follow-up status report regarding this interventional procedure.  Comments:  No additional relevant information.   Plan of care    Medications administered: We administered iohexol, lidocaine, diazepam, ropivacaine (PF) 2 mg/mL (0.2%), sodium chloride flush, dexamethasone, and dexamethasone.  Follow-up plan:   Return in about 4 weeks (around 09/29/2021) for Post Procedure Evaluation,  virtual.         Recent Visits Date Type Provider Dept  08/11/21 Telemedicine Gillis Santa, MD [facility]  07/21/21 Procedure visit Gillis Santa, MD [facility]  07/19/21 Office Visit Gillis Santa, MD  [facility]  07/13/21 Telemedicine Gillis Santa, MD [facility]  Showing recent visits within past 90 days and meeting all other requirements Today's Visits Date Type Provider Dept  09/01/21 Procedure visit Gillis Santa, MD [facility]  Showing today's visits and meeting all other requirements Future Appointments Date Type Provider Dept  10/06/21 Appointment Gillis Santa, MD [facility]  Showing future appointments within next 90 days and meeting all other requirements  Disposition: Discharge home  Discharge (Date  Time): 09/01/2021; 1417 hrs.   Primary Care Physician: Juanda Bond, NP Location: [facility] Note by: Gillis Santa, MD Date: 09/01/2021; Time: 2:46 PM  DISCLAIMER: Medicine is not an exact science. It has no guarantees or warranties. The decision to proceed with this intervention was based on the information collected from the patient. Conclusions were drawn from the patient's questionnaire, interview, and examination. Because information was provided in large part by the patient, it cannot be guaranteed that it has not been purposely or unconsciously manipulated or altered. Every effort has been made to obtain as much accurate, relevant, available data as possible. Always take into account that the treatment will also be dependent on availability of resources and existing treatment guidelines, considered by other Pain Management Specialists as being common knowledge and practice, at the time of the intervention. It is also important to point out that variation in procedural techniques and pharmacological choices are the acceptable norm. For Medico-Legal review purposes, the  indications, contraindications, technique, and results of the these procedures should only be evaluated, judged and interpreted by a Board-Certified Interventional Pain Specialist with extensive familiarity and expertise in the same exact procedure and technique.

## 2021-09-01 NOTE — Progress Notes (Signed)
PROVIDER NOTE: Information contained herein reflects review and annotations entered in association with encounter. Interpretation of such information and data should be left to medically-trained personnel. Information provided to patient can be located elsewhere in the medical record under "Patient Instructions". Document created using STT-dictation technology, any transcriptional errors that may result from process are unintentional.    Patient: John Hartman  Service Category: Procedure  Provider: Gillis Santa, MD  DOB: 09/11/1971  DOS: 09/01/2021  Location: [facility]  MRN: [id number]  Setting: Ambulatory - outpatient  Referring Provider: Gillis Santa, MD  Type: Established Patient  Specialty: Interventional Pain Management  PCP: Juanda Bond, NP   Primary Reason for Visit: Interventional Pain Management Treatment. CC: Neck Pain (Right side)    Procedure:          Anesthesia, Analgesia, Anxiolysis:  Type: Diagnostic, Greater, Occipital Nerve Block           Region: Posterolateral Cervical Level: Occipital Ridge   Laterality: Right-Sided  Type: Local Anesthesia Local Anesthetic: Lidocaine 1-2%    Position: Prone   Indications:  Occipital neuralgia right side  Pain Score: Pre-procedure: 8 /10 Post-procedure: 0-No pain (moving neck and putting shirt on)/10     Pre-op H&P Assessment:  John Hartman is a 50 y.o. (year old), male patient, seen today for interventional treatment. He  has a past surgical history that includes Shoulder surgery (Left, 2007); Nasal septum surgery (2004); ACDF with fusion (2007); Testicular torsion (1980s); Vasectomy; Colonoscopy with propofol (N/A, 06/26/2015); Cardiac catheterization (Left, 08/15/2016); Coronary/Graft Acute MI Revascularization (N/A, 08/23/2018); LEFT HEART CATH AND CORONARY ANGIOGRAPHY (N/A, 08/23/2018); Cystoscopy with biopsy (N/A, 11/22/2019); Cystoscopy/ureteroscopy/holmium laser/stent placement (Left, 11/22/2019); Shoulder  arthroscopy w/ rotator cuff repair (10/01/2019); LEFT HEART CATH AND CORONARY ANGIOGRAPHY (N/A, 02/10/2020); Cystoscopy/ureteroscopy/holmium laser (02/10/2020); Cystoscopy w/ retrogrades (02/10/2020); and Extracorporeal shock wave lithotripsy (Left, 10/29/2020). John Hartman has a current medication list which includes the following prescription(s): allopurinol, amlodipine, aspirin ec, atorvastatin, cetirizine, diphenhydramine, trulicity, epinephrine, ezetimibe, famotidine, indomethacin, magnesium, nitroglycerin, onetouch delica lancets 0000000, onetouch ultra, pregabalin, xigduo xr, hydralazine, metoprolol tartrate, rybelsus, and [DISCONTINUED] metformin. His primarily concern today is the Neck Pain (Right side)  Initial Vital Signs:  Pulse/HCG Rate: 86  Temp: (!) 97 F (36.1 C) Resp: 18 BP: (!) 157/117 SpO2: 98 %  BMI: Estimated body mass index is 28.45 kg/m as calculated from the following:   Height as of this encounter: '5\' 11"'$  (1.803 m).   Weight as of this encounter: 204 lb (92.5 kg).  Risk Assessment: Allergies: Reviewed. He is allergic to imdur [isosorbide nitrate], lisinopril, other, drug [tape], tapentadol, and ace inhibitors.  Allergy Precautions: None required Coagulopathies: Reviewed. None identified.  Blood-thinner therapy: None at this time Active Infection(s): Reviewed. None identified. John Hartman is afebrile  Site Confirmation: John Hartman was asked to confirm the procedure and laterality before marking the site Procedure checklist: Completed Consent: Before the procedure and under the influence of no sedative(s), amnesic(s), or anxiolytics, the patient was informed of the treatment options, risks and possible complications. To fulfill our ethical and legal obligations, as recommended by the American Medical Association's Code of Ethics, I have informed the patient of my clinical impression; the nature and purpose of the treatment or procedure; the risks, benefits, and possible  complications of the intervention; the alternatives, including doing nothing; the risk(s) and benefit(s) of the alternative treatment(s) or procedure(s); and the risk(s) and benefit(s) of doing nothing. The patient was provided information about the general risks and possible complications associated  with the procedure. These may include, but are not limited to: failure to achieve desired goals, infection, bleeding, organ or nerve damage, allergic reactions, paralysis, and death. In addition, the patient was informed of those risks and complications associated to the procedure, such as failure to decrease pain; infection; bleeding; organ or nerve damage with subsequent damage to sensory, motor, and/or autonomic systems, resulting in permanent pain, numbness, and/or weakness of one or several areas of the body; allergic reactions; (i.e.: anaphylactic reaction); and/or death. Furthermore, the patient was informed of those risks and complications associated with the medications. These include, but are not limited to: allergic reactions (i.e.: anaphylactic or anaphylactoid reaction(s)); adrenal axis suppression; blood sugar elevation that in diabetics may result in ketoacidosis or comma; water retention that in patients with history of congestive heart failure may result in shortness of breath, pulmonary edema, and decompensation with resultant heart failure; weight gain; swelling or edema; medication-induced neural toxicity; particulate matter embolism and blood vessel occlusion with resultant organ, and/or nervous system infarction; and/or aseptic necrosis of one or more joints. Finally, the patient was informed that Medicine is not an exact science; therefore, there is also the possibility of unforeseen or unpredictable risks and/or possible complications that may result in a catastrophic outcome. The patient indicated having understood very clearly. We have given the patient no guarantees and we have made no  promises. Enough time was given to the patient to ask questions, all of which were answered to the patient's satisfaction. John Hartman has indicated that he wanted to continue with the procedure. Attestation: I, the ordering provider, attest that I have discussed with the patient the benefits, risks, side-effects, alternatives, likelihood of achieving goals, and potential problems during recovery for the procedure that I have provided informed consent. Date  Time: 09/01/2021  1:24 PM  Pre-Procedure Preparation:  Monitoring: As per clinic protocol. Respiration, ETCO2, SpO2, BP, heart rate and rhythm monitor placed and checked for adequate function Safety Precautions: Patient was assessed for positional comfort and pressure points before starting the procedure. Time-out: I initiated and conducted the "Time-out" before starting the procedure, as per protocol. The patient was asked to participate by confirming the accuracy of the "Time Out" information. Verification of the correct person, site, and procedure were performed and confirmed by me, the nursing staff, and the patient. "Time-out" conducted as per Joint Commission's Universal Protocol (UP.01.01.01). Time: 1402  Description of Procedure:          Target Area: Area medial to the occipital artery at the level of the superior nuchal ridge Approach: Posterior approach Area Prepped: Entire Posterior Occipital Region DuraPrep (Iodine Povacrylex [0.7% available iodine] and Isopropyl Alcohol, 74% w/w) Safety Precautions: Aspiration looking for blood return was conducted prior to all injections. At no point did we inject any substances, as a needle was being advanced. No attempts were made at seeking any paresthesias. Safe injection practices and needle disposal techniques used. Medications properly checked for expiration dates. SDV (single dose vial) medications used. Description of the Procedure: Protocol guidelines were followed. The target area was  identified and the area prepped in the usual manner. Skin & deeper tissues infiltrated with local anesthetic. Appropriate amount of time allowed to pass for local anesthetics to take effect. The procedure needles were then advanced to the target area. Proper needle placement secured. Negative aspiration confirmed. Solution injected in intermittent fashion, asking for systemic symptoms every 0.5cc of injectate. The needles were then removed and the area cleansed, making sure to leave  some of the prepping solution back to take advantage of its long term bactericidal properties.  Vitals:   09/01/21 1358 09/01/21 1403 09/01/21 1408 09/01/21 1413  BP: (!) 145/110 (!) 143/116 (!) 149/114 (!) 169/115  Pulse: 90 86 85 83  Resp: '15 19 18 '$ (!) 22  Temp:      SpO2: 97% 96% 97% 98%  Weight:      Height:        Start Time: 1403 hrs. End Time: 1413 hrs. Materials:  Needle(s) Type: Spinal Needle Gauge: 22G Length: 3.5-in Medication(s): Please see orders for medications and dosing details.  4 cc solution made of 3 cc of 0.2% ropivacaine, 1 cc of Decadron 10 mg/cc.  Injected along the right greater and lesser occipital nerve.  Post-operative Assessment:  Post-procedure Vital Signs:  Pulse/HCG Rate: 83 (nsr)  Temp: (!) 97 F (36.1 C) Resp: (!) 22 BP: (!) 169/115 SpO2: 98 %  EBL: None  Complications: No immediate post-treatment complications observed by team, or reported by patient.  Note: The patient tolerated the entire procedure well. A repeat set of vitals were taken after the procedure and the patient was kept under observation following institutional policy, for this type of procedure. Post-procedural neurological assessment was performed, showing return to baseline, prior to discharge. The patient was provided with post-procedure discharge instructions, including a section on how to identify potential problems. Should any problems arise concerning this procedure, the patient was given  instructions to immediately contact us, at any time, without hesitation. In any case, we plan to contact the patient by telephone for a follow-up status report regarding this interventional procedure.  Comments:  No additional relevant information.  Plan of Care  Orders:  Orders Placed This Encounter  Procedures   DG PAIN CLINIC C-ARM 1-60 MIN NO REPORT    Intraoperative interpretation by procedural physician at [facility].    Standing Status:   Standing    Number of Occurrences:   1    Order Specific Question:   Reason for exam:    Answer:   Assistance in needle guidance and placement for procedures requiring needle placement in or near specific anatomical locations not easily accessible without such assistance.     Medications ordered for procedure: Meds ordered this encounter  Medications   iohexol (OMNIPAQUE) 180 MG/ML injection 10 mL    Must be Myelogram-compatible. If not available, you may substitute with a water-soluble, non-ionic, hypoallergenic, myelogram-compatible radiological contrast medium.   lidocaine (XYLOCAINE) 2 % (with pres) injection 400 mg   diazepam (VALIUM) tablet 5 mg    Make sure Flumazenil is available in the pyxis when using this medication. If oversedation occurs, administer 0.2 mg IV over 15 sec. If after 45 sec no response, administer 0.2 mg again over 1 min; may repeat at 1 min intervals; not to exceed 4 doses (1 mg)   ropivacaine (PF) 2 mg/mL (0.2%) (NAROPIN) injection 1 mL   sodium chloride flush (NS) 0.9 % injection 1 mL   dexamethasone (DECADRON) injection 10 mg   dexamethasone (DECADRON) injection 10 mg   Medications administered: We administered iohexol, lidocaine, diazepam, ropivacaine (PF) 2 mg/mL (0.2%), sodium chloride flush, dexamethasone, and dexamethasone.  See the medical record for exact dosing, route, and time of administration.  Follow-up plan:   Return in about 4 weeks (around 09/29/2021) for Post Procedure Evaluation,  virtual.       Right T1-T2 ESI 07/21/20, 09/01/21; RIGHT GONB 09/01/21    Recent Visits Date Type  Provider Dept  08/11/21 Telemedicine Gillis Santa, MD [facility]  07/21/21 Procedure visit Gillis Santa, MD [facility]  07/19/21 Office Visit Gillis Santa, MD [facility]  07/13/21 Telemedicine Gillis Santa, MD [facility]  Showing recent visits within past 90 days and meeting all other requirements Today's Visits Date Type Provider Dept  09/01/21 Procedure visit Gillis Santa, MD [facility]  Showing today's visits and meeting all other requirements Future Appointments Date Type Provider Dept  10/06/21 Appointment Gillis Santa, MD [facility]  Showing future appointments within next 90 days and meeting all other requirements Disposition: Discharge home  Discharge (Date  Time): 09/01/2021; 1417 hrs.   Primary Care Physician: Juanda Bond, NP Location: [facility] Note by: Gillis Santa, MD Date: 09/01/2021; Time: 2:52 PM  Disclaimer:  Medicine is not an exact science. The only guarantee in medicine is that nothing is guaranteed. It is important to note that the decision to proceed with this intervention was based on the information collected from the patient. The Data and conclusions were drawn from the patient's questionnaire, the interview, and the physical examination. Because the information was provided in large part by the patient, it cannot be guaranteed that it has not been purposely or unconsciously manipulated. Every effort has been made to obtain as much relevant data as possible for this evaluation. It is important to note that the conclusions that lead to this procedure are derived in large part from the available data. Always take into account that the treatment will also be dependent on availability of resources and existing treatment guidelines, considered by other Pain Management  Practitioners as being common knowledge and practice, at the time of the intervention. For Medico-Legal purposes, it is also important to point out that variation in procedural techniques and pharmacological choices are the acceptable norm. The indications, contraindications, technique, and results of the above procedure should only be interpreted and judged by a Board-Certified Interventional Pain Specialist with extensive familiarity and expertise in the same exact procedure and technique.

## 2021-09-02 ENCOUNTER — Telehealth: Payer: Self-pay

## 2021-09-02 NOTE — Telephone Encounter (Signed)
Post procedure phone call.  Patient states he is alright .

## 2021-09-12 ENCOUNTER — Ambulatory Visit (INDEPENDENT_AMBULATORY_CARE_PROVIDER_SITE_OTHER): Payer: No Typology Code available for payment source

## 2021-09-12 ENCOUNTER — Encounter: Payer: Self-pay | Admitting: Emergency Medicine

## 2021-09-12 ENCOUNTER — Other Ambulatory Visit: Payer: Self-pay

## 2021-09-12 ENCOUNTER — Ambulatory Visit
Admission: EM | Admit: 2021-09-12 | Discharge: 2021-09-12 | Disposition: A | Discharge status: Home / Self Care | Payer: No Typology Code available for payment source | Attending: Physician Assistant | Admitting: Physician Assistant

## 2021-09-12 DIAGNOSIS — B349 Viral infection, unspecified: Secondary | ICD-10-CM | POA: Diagnosis not present

## 2021-09-12 DIAGNOSIS — R059 Cough, unspecified: Secondary | ICD-10-CM | POA: Diagnosis not present

## 2021-09-12 DIAGNOSIS — R5383 Other fatigue: Secondary | ICD-10-CM | POA: Diagnosis not present

## 2021-09-12 MED ORDER — PSEUDOEPH-BROMPHEN-DM 30-2-10 MG/5ML PO SYRP: 10.0000 mL | ORAL_SOLUTION | Freq: Four times a day (QID) | ORAL | 0 refills | Status: AC | PRN

## 2021-09-12 NOTE — Discharge Instructions (Addendum)
-  This is likely COVID-19 given your exposure, but you have decided against testing since it would not change treatment at this time you have been ill for nearly a week so you are outside of the window for antiviral therapy and tomorrow would be the last day for possible antibody therapy. -The azithromycin and ivermectin you were prescribed likely did not help your condition if this is COVID-19.  An opportunity was missed to start you on actual antibody or antiviral therapy which could have helped. -Your chest x-ray is normal.  No evidence of pneumonia. -Treatment plan at this time is to treat your symptoms and let your body fight the illness.  I have sent in a cough medication for you.  Continue with ibuprofen and or Tylenol to help with the achiness and fevers.  You should stay home until the fever resolves which may be a few more days.  If you feel short of breath you can use the inhaler you are prescribed.  If your breathing is not controlled by the inhaler you should be seen again.  If fevers not going away in the next few days or you are still not feeling better, please follow-up with PCP.  Go to emergency department for any severe acute worsening of her symptoms.

## 2021-09-12 NOTE — ED Provider Notes (Signed)
[facility]    CSN: [id number] Arrival date & time: 09/12/21  0855      History   Chief Complaint Chief Complaint  Patient presents with   Covid Exposure   Cough    HPI John Hartman is a 50 y.o. male presenting for 6-day history of fever, cough, congestion, fatigue, body aches, chest tightness upon cough and occasional shortness of breath.  Patient says his son tested positive for COVID-19 3 weeks ago and his wife got sick with similar symptoms 2 weeks ago and had presumed COVID-19 but did not test.  Patient also believes he likely has COVID-19 so he did not test himself.  He has not been vaccinated for COVID-19.  He says that he contacted his PCP today symptoms began and they sent in ivermectin, azithromycin, and an albuterol inhaler.  Patient states he continues to have fevers up to 103 degrees.  He says that he informed his PCP of this and they advised him to seek evaluation in urgent care and to request a chest x-ray since he probably has pneumonia.  Patient has not been taking any over-the-counter medications other than ibuprofen and Tylenol for his fevers.  Temperature is currently 99.7 degrees.  Past medical history is significant for hypertension, hyperlipidemia, previous MI, diabetes, and chronic CHF.  No other complaints.  HPI  Past Medical History:  Diagnosis Date   Angioedema    Bronchitis    DDD (degenerative disc disease), cervical    Diabetes mellitus ([place])    Fatty liver 03/31/2017   [place] April 2018   GERD (gastroesophageal reflux disease)    Gout    Heart attack ([place])    HTN (hypertension)    Hypertension    CONTROLLED ON MEDS   IBS (irritable bowel syndrome)    Kidney stone    LVH (left ventricular hypertrophy) 11/24/2018   Moderate, ECHO   Myocardial infarction ([place]) 08/23/2018   Seasonal allergies    Spinal stenosis     Patient Active Problem List   Diagnosis Date Noted   Pain management contract signed 07/19/2021   Cervical radicular pain  07/13/2021   Perinephric hematoma 10/29/2020   Back muscle spasm 10/28/2020   Chronic pain syndrome 04/30/2020   Lumbar radiculopathy 04/30/2020   Spinal stenosis of lumbar region with neurogenic claudication 04/30/2020   S/P cervical spinal fusion 04/30/2020   HFrEF (heart failure with reduced ejection fraction) ([place]) 02/18/2020   Nephrolithiasis 02/18/2020   OSA (obstructive sleep apnea) 02/17/2020   Right ureteral stone 02/07/2020   Diabetes mellitus without complication ([place]) [id number]   Chronic systolic CHF (congestive heart failure) ([place]) 02/07/2020   Unspecified inflammatory spondylopathy, 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   Arthritis of right acromioclavicular joint 09/23/2019   Incomplete tear of right rotator cuff 09/23/2019   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   H/O non-ST elevation myocardial infarction (NSTEMI) 11/24/2018   LVH (left ventricular hypertrophy) 11/24/2018   Retrolisthesis of vertebrae 11/24/2018   History of angioedema 09/01/2018   NSTEMI (non-ST elevated myocardial infarction) ([place]) 08/22/2018   Chronic lower back pain 08/04/2017   Degenerative disc disease, lumbar 08/04/2017   Lumbar facet arthropathy 08/04/2017   Calcification of abdominal aorta ([place]) 08/04/2017   NAFL (nonalcoholic fatty liver) [id number]   Hyperlipidemia LDL goal <70 01/04/2017   Elevated serum glutamic pyruvic transaminase (SGPT) level 01/04/2017   Coronary artery disease 08/25/2016   Ketonuria 07/12/2016   Glucosuria 07/12/2016   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/2015   Family history of malignant neoplasm of gastrointestinal  tract    Benign neoplasm of rectosigmoid junction    Essential hypertension 05/28/2015   Gout 05/28/2015   IBS (irritable bowel syndrome) 05/28/2015    Past Surgical History:  Procedure Laterality Date   ACDF with fusion  2007   CARDIAC CATHETERIZATION Left 08/15/2016   Procedure: Left Heart Cath and Coronary Angiography;  Surgeon: Wellington Hampshire, MD;  Location:  [facility];  Service: Cardiovascular;  Laterality: Left;   COLONOSCOPY WITH PROPOFOL N/A 06/26/2015   Procedure: COLONOSCOPY WITH PROPOFOL;  Surgeon: Lucilla Lame, MD;  Location: [facility];  Service: Endoscopy;  Laterality: N/A;  with biopsies   CORONARY/GRAFT ACUTE MI REVASCULARIZATION N/A 08/23/2018   Procedure: Coronary/Graft Acute MI Revascularization;  Surgeon: Isaias Cowman, MD;  Location: [facility];  Service: Cardiovascular;  Laterality: N/A;   CYSTOSCOPY W/ RETROGRADES  02/10/2020   Procedure: CYSTOSCOPY WITH RETROGRADE PYELOGRAM;  Surgeon: Billey Co, MD;  Location: [facility];  Service: Urology;;   CYSTOSCOPY WITH BIOPSY N/A 11/22/2019   Procedure: CYSTOSCOPY WITH Bladder BIOPSY & Almyra Free;  Surgeon: Billey Co, MD;  Location: [facility];  Service: Urology;  Laterality: N/A;   CYSTOSCOPY/URETEROSCOPY/HOLMIUM LASER  02/10/2020   Procedure: CYSTOSCOPY/URETEROSCOPY/HOLMIUM LASER;  Surgeon: Billey Co, MD;  Location: [facility];  Service: Urology;;   CYSTOSCOPY/URETEROSCOPY/HOLMIUM LASER/STENT PLACEMENT Left 11/22/2019   Procedure: CYSTOSCOPY/URETEROSCOPY/BILATERAL RETROGRADE PYELOGRAM/HOLMIUM LASER/STENT PLACEMENT;  Surgeon: Billey Co, MD;  Location: [facility];  Service: Urology;  Laterality: Left;   EXTRACORPOREAL SHOCK WAVE LITHOTRIPSY Left 10/29/2020   Procedure: EXTRACORPOREAL SHOCK WAVE LITHOTRIPSY (ESWL);  Surgeon: Hollice Espy, MD;  Location: [facility];  Service: Urology;  Laterality: Left;   LEFT HEART CATH AND CORONARY ANGIOGRAPHY N/A 08/23/2018   Procedure: LEFT HEART CATH AND CORONARY ANGIOGRAPHY;  Surgeon: Isaias Cowman, MD;  Location: [facility];  Service: Cardiovascular;  Laterality: N/A;   LEFT HEART CATH AND CORONARY ANGIOGRAPHY N/A 02/10/2020   Procedure: LEFT HEART CATH AND CORONARY ANGIOGRAPHY;  Surgeon: Corey Skains, MD;  Location: [facility];  Service: Cardiovascular;  Laterality: N/A;    NASAL SEPTUM SURGERY  2004   SHOULDER ARTHROSCOPY W/ ROTATOR CUFF REPAIR  10/01/2019   SHOULDER SURGERY Left 2007   Testicular torsion  1980s   VASECTOMY         Home Medications    Prior to Admission medications   Medication Sig Start Date End Date Taking? Authorizing Provider  allopurinol (ZYLOPRIM) 100 MG tablet Take 100 mg by mouth daily.    Yes [provider]  amLODipine (NORVASC) 10 MG tablet Take 1 tablet (10 mg total) by mouth daily. 02/13/20  Yes Nicole Kindred A, DO  aspirin EC 81 MG tablet Take 81 mg by mouth daily.   Yes [provider]  atorvastatin (LIPITOR) 80 MG tablet Take 80 mg by mouth daily. 08/02/21  Yes [provider]  cetirizine (ZYRTEC) 10 MG tablet Take 10 mg by mouth at bedtime.   Yes [provider]  Dulaglutide (TRULICITY) 1.5 0000000 SOPN Inject into the skin once a week.   Yes [provider]  ezetimibe (ZETIA) 10 MG tablet Take 1 tablet (10 mg total) by mouth daily. 12/03/18  Yes Lada, Satira Anis, MD  famotidine (PEPCID) 10 MG tablet Take 10 mg by mouth daily.   Yes [provider]  hydrALAZINE (APRESOLINE) 25 MG tablet Take 1 tablet (25 mg total) by mouth 2 (two) times daily. 08/25/18 09/12/21 Yes Saundra Shelling, MD  Magnesium 500 MG CAPS Take by mouth.   Yes [provider]  metoprolol tartrate (LOPRESSOR) 25 MG tablet Take 2 tablets (50 mg total) by mouth 2 (two) times daily. 11/01/20 09/12/21 Yes Dessa Phi, DO  pregabalin (LYRICA) 50 MG capsule 1 capsule in the morning and 2 capsules at night. 01/12/21  Yes Gillis Santa, MD  XIGDUO XR 04-999 MG TB24 Take 1 tablet by mouth 2 (two) times daily. 06/30/21  Yes [provider]  brompheniramine-pseudoephedrine-DM 30-2-10 MG/5ML syrup Take 10 mLs by mouth 4 (four) times daily as needed for up to 7 days. 09/12/21 09/19/21 Yes Danton Clap, PA-C  diphenhydrAMINE (BENADRYL) 25 MG tablet Take 50 mg by mouth at bedtime.    [provider]  EPINEPHrine 0.3 mg/0.3 mL IJ SOAJ injection Inject into the muscle as directed. 05/11/21   [provider]  indomethacin (INDOCIN) 25 MG capsule Take 1 capsule (25 mg total) by mouth 3 (three) times daily as needed. 03/18/21   Verda Cumins, MD  nitroGLYCERIN (NITROSTAT) 0.4 MG SL tablet Place 1 tablet (0.4 mg total) under the tongue every 5 (five) minutes as needed for chest pain. 08/25/18   Saundra Shelling, MD  OneTouch Delica Lancets [id number] MISC Apply 1 each topically 2 (two) times daily. 03/02/21   [provider]  Donald Siva test strip SMARTSIG:Via Meter 08/19/20   [provider]  RYBELSUS 3 MG TABS Take 3 mg by mouth daily at 6 (six) AM.     [provider]  metFORMIN (GLUCOPHAGE) 1000 MG tablet Take 1 tablet (1,000 mg total) by mouth 2 (two) times daily with a meal. 11/01/20 12/16/20  Dessa Phi, DO    Family History Family History  Problem Relation Age of Onset   Breast cancer Mother    Skin cancer Mother    Arrhythmia Mother        A-fib   Cancer Mother        breast, colon?, skin   Hypertension Father    Diabetes Father    Cancer Maternal Grandfather    Heart disease Brother        stent   Allergies Son    Alzheimer's disease Maternal Grandmother    Stroke Neg Hx    COPD Neg Hx     Social History Social History   Tobacco Use   Smoking status: Former    Packs/day: 1.00    Years: 10.00    Pack years: 10.00    Types: Cigarettes    Quit date: 04/15/2003    Years since quitting: 18.4   Smokeless tobacco: Never  Vaping Use   Vaping Use: Never used  Substance Use Topics   Alcohol use: No    Alcohol/week: 0.0 standard drinks   Drug use: No     Allergies   Imdur [isosorbide nitrate], Lisinopril, Other, Drug [tape], Tapentadol, and Ace inhibitors   Review of Systems Review of Systems  Constitutional:  Positive for fatigue and fever.  HENT:  Positive for congestion and rhinorrhea. Negative for sinus pressure, sinus pain  and sore throat.   Respiratory:  Positive for cough, chest tightness and shortness of breath. Negative for wheezing.   Cardiovascular:  Negative for chest pain.  Gastrointestinal:  Negative for abdominal pain, diarrhea, nausea and vomiting.  Musculoskeletal:  Positive for myalgias.  Neurological:  Negative for weakness, light-headedness and headaches.  Hematological:  Negative for adenopathy.    Physical Exam Triage Vital Signs ED Triage Vitals  Enc Vitals Group  BP 09/12/21 0912 (!) 111/96     Pulse Rate 09/12/21 0912 97     Resp 09/12/21 0912 17     Temp 09/12/21 0912 99.7 F (37.6 C)     Temp Source 09/12/21 0912 Oral     SpO2 09/12/21 0912 96 %     Weight 09/12/21 0909 205 lb (93 kg)     Height 09/12/21 0909 '5\' 11"'$  (1.803 m)     Head Circumference --      Peak Flow --      Pain Score 09/12/21 0908 0     Pain Loc --      Pain Edu? --      Excl. in [place]? --    No data found.  Updated Vital Signs BP (!) 111/96 (BP Location: Right Arm)   Pulse 97   Temp 99.7 F (37.6 C) (Oral)   Resp 17   Ht '5\' 11"'$  (1.803 m)   Wt 205 lb (93 kg)   SpO2 96%   BMI 28.59 kg/m      Physical Exam Vitals and nursing note reviewed.  Constitutional:      General: He is not in acute distress.    Appearance: Normal appearance. He is well-developed. He is ill-appearing. He is not toxic-appearing or diaphoretic.     Comments: Patient coughs consistently throughout visit  HENT:     Head: Normocephalic and atraumatic.     Nose: Congestion present.     Mouth/Throat:     Mouth: Mucous membranes are moist.     Pharynx: Oropharynx is clear.  Eyes:     General: No scleral icterus.    Conjunctiva/sclera: Conjunctivae normal.  Cardiovascular:     Rate and Rhythm: Normal rate and regular rhythm.     Heart sounds: Normal heart sounds.  Pulmonary:     Effort: Pulmonary effort is normal. No respiratory distress.     Breath sounds: Normal breath sounds. No wheezing, rhonchi or rales.   Musculoskeletal:     Cervical back: Neck supple.  Skin:    General: Skin is warm and dry.  Neurological:     General: No focal deficit present.     Mental Status: He is alert. Mental status is at baseline.     Motor: No weakness.     Coordination: Coordination normal.     Gait: Gait normal.  Psychiatric:        Mood and Affect: Mood normal.        Behavior: Behavior normal.        Thought Content: Thought content normal.     UC Treatments / Results  Labs (all labs ordered are listed, but only abnormal results are displayed) Labs Reviewed - No data to display  EKG   Radiology DG Chest 2 View  Result Date: 09/12/2021 CLINICAL DATA:  Cough for a week, COVID exposure EXAM: CHEST - 2 VIEW COMPARISON:  10/30/2020 FINDINGS: The heart size and mediastinal contours are within normal limits. Both lungs are clear. The visualized skeletal structures are unremarkable. IMPRESSION: No acute abnormality of the lungs. Electronically Signed   By: Eddie Candle M.D.   On: 09/12/2021 09:31    Procedures Procedures (including critical care time)  Medications Ordered in UC Medications - No data to display  Initial Impression / Assessment and Plan / UC Course  I have reviewed the triage vital signs and the nursing notes.  Pertinent labs & imaging results that were available during my care of the patient were  reviewed by me and considered in my medical decision making (see chart for details).  50 year old male presenting for 6-day history of fever, cough, congestion, fatigue, achiness, chest tightness upon breathing and shortness of breath.  Patient believes he has COVID-19 due to exposure but has not tested himself and declines to be tested today.  He does request a chest x-ray.  Vital signs are all normal and stable.  He is ill-appearing but nontoxic.  Exam significant for nasal congestion.  Chest is clear to auscultation heart regular rate and rhythm.  Chest x-ray performed today and  independently reviewed by me.  Chest x-ray is within normal limits.  Reviewed this with patient.  Advised him he likely does have COVID-19 but without a test I cannot be positive.  He still declines to be tested.  I did advise if he were to be positive we could try to get him in for antibody therapy tomorrow since it would be the last day but he is out of the window for treatment with antiviral therapy.  Of note he has had azithromycin and ivermectin prescribed by PCP.  He has not been taking any cough medications or decongestants.  Advised patient symptoms are consistent with viral illness, likely COVID-19.  I have advised supportive care with increasing rest and fluids, continue Tylenol ibuprofen for fever control and I have sent in Bromfed-DM.  Advised for him to contact PCP for follow-up if still not improving.  He has been advised to go to emergency department for any acute worsening of symptoms especially his breathing if the albuterol inhaler is not helping him.  Offered work note he declined stating that he works from home.  Final Clinical Impressions(s) / UC Diagnoses   Final diagnoses:  Viral illness  Cough  Fatigue, unspecified type     Discharge Instructions      -This is likely COVID-19 given your exposure, but you have decided against testing since it would not change treatment at this time you have been ill for nearly a week so you are outside of the window for antiviral therapy and tomorrow would be the last day for possible antibody therapy. -The azithromycin and ivermectin you were prescribed likely did not help your condition if this is COVID-19.  An opportunity was missed to start you on actual antibody or antiviral therapy which could have helped. -Your chest x-ray is normal.  No evidence of pneumonia. -Treatment plan at this time is to treat your symptoms and let your body fight the illness.  I have sent in a cough medication for you.  Continue with ibuprofen and or Tylenol to  help with the achiness and fevers.  You should stay home until the fever resolves which may be a few more days.  If you feel short of breath you can use the inhaler you are prescribed.  If your breathing is not controlled by the inhaler you should be seen again.  If fevers not going away in the next few days or you are still not feeling better, please follow-up with PCP.  Go to emergency department for any severe acute worsening of her symptoms.   ED Prescriptions     Medication Sig Dispense Auth. Provider   brompheniramine-pseudoephedrine-DM 30-2-10 MG/5ML syrup Take 10 mLs by mouth 4 (four) times daily as needed for up to 7 days. 150 mL Danton Clap, PA-C      I have reviewed the PDMP during this encounter.   Danton Clap, PA-C 09/12/21 [phone]

## 2021-09-12 NOTE — ED Triage Notes (Signed)
Patient states that his son and wife had covid over the past 2 weeks.  Patient states that he started having cough, low grade fever and headache on Monday.  Patient states that he took the covid antiviral medication on Tuesday.  Patient states that on Friday afternoon his cough and chest congestion has gotten worse.

## 2021-10-05 ENCOUNTER — Encounter: Payer: Self-pay | Admitting: Student in an Organized Health Care Education/Training Program

## 2021-10-06 ENCOUNTER — Other Ambulatory Visit: Payer: Self-pay

## 2021-10-06 ENCOUNTER — Ambulatory Visit
Payer: No Typology Code available for payment source | Attending: Student in an Organized Health Care Education/Training Program | Admitting: Student in an Organized Health Care Education/Training Program

## 2021-10-06 DIAGNOSIS — G894 Chronic pain syndrome: Secondary | ICD-10-CM | POA: Diagnosis not present

## 2021-10-06 DIAGNOSIS — M5412 Radiculopathy, cervical region: Secondary | ICD-10-CM

## 2021-10-06 DIAGNOSIS — M5416 Radiculopathy, lumbar region: Secondary | ICD-10-CM

## 2021-10-06 DIAGNOSIS — M5481 Occipital neuralgia: Secondary | ICD-10-CM

## 2021-10-06 MED ORDER — PREGABALIN 50 MG PO CAPS: ORAL_CAPSULE | ORAL | 5 refills | Status: DC

## 2021-10-06 MED ORDER — TIZANIDINE HCL 4 MG PO TABS
4.0000 mg | ORAL_TABLET | Freq: Three times a day (TID) | ORAL | 5 refills | Status: AC | PRN
Start: 2021-10-06 — End: 2022-04-04

## 2021-10-06 NOTE — Assessment & Plan Note (Signed)
Repeat greater occipital nerve block, T1-T2 ESI as needed.  Requested Prescriptions   Signed Prescriptions Disp Refills  . pregabalin (LYRICA) 50 MG capsule 90 capsule 5    Sig: 1 capsule in the morning and 2 capsules at night.  Marland Kitchen tiZANidine (ZANAFLEX) 4 MG tablet 90 tablet 5    Sig: Take 1 tablet (4 mg total) by mouth every 8 (eight) hours as needed for muscle spasms.

## 2021-10-06 NOTE — Assessment & Plan Note (Signed)
Good benefit after T1-T2 ESI.  Repeat as needed.

## 2021-10-06 NOTE — Progress Notes (Signed)
Patient: John Hartman  Service Category: E/M  Provider: Gillis Santa, MD  DOB: 04/19/1971  DOS: 10/06/2021  Location: Office  MRN: [id number]  Setting: Ambulatory outpatient  Referring Provider: Juanda Bond, NP  Type: Established Patient  Specialty: Interventional Pain Management  PCP: Juanda Bond, NP  Location: Home  Delivery: TeleHealth     Virtual Encounter - Pain Management PROVIDER NOTE: Information contained herein reflects review and annotations entered in association with encounter. Interpretation of such information and data should be left to medically-trained personnel. Information provided to patient can be located elsewhere in the medical record under "Patient Instructions". Document created using STT-dictation technology, any transcriptional errors that may result from process are unintentional.    Contact & Pharmacy Preferred: [phone] Home: [phone] (home) Mobile: [phone] (mobile) E-mail: [email or web address]  [id number] [place] [address]: [phone]: [phone] [place]Phone: [phone]: [phone]  Pre-screening  Mr. Wickstrom offered "in-person" vs "virtual" encounter. He indicated preferring virtual for this encounter.   Reason COVID-19*  Social distancing based on CDC and AMA recommendations.   I contacted SKEVANTE LUNTon 10/06/2021 via telephone.      I clearly identified myself as BGillis Santa MD. I verified that I was speaking with the correct person using two identifiers (Name: SSAMUELE STOREY and date of birth: [birth year].  Consent I sought verbal advanced consent from SOlena Materfor virtual visit interactions. I informed Mr. CStandishof possible security and privacy concerns, risks, and limitations associated with providing "not-in-person"  medical evaluation and management services. I also informed Mr. CTarbetof the availability of "in-person" appointments. Finally, I informed him that there would be a charge for the virtual visit and that he could be  personally, fully or partially, financially responsible for it. Mr. CHubbleexpressed understanding and agreed to proceed.   Historic Elements   Mr. SEASTIN SWINGis a 50y.o. year old, male patient evaluated today after our last contact on 09/01/2021. Mr. CWilson has a past medical history of Angioedema, Bronchitis, DDD (degenerative disc disease), cervical, [place], Fatty liver (03/31/2017), GERD (gastroesophageal reflux disease), Gout, [place], HTN (hypertension), Hypertension, IBS (irritable bowel syndrome), Kidney stone, LVH (left ventricular hypertrophy) (11/24/2018), [place] infarction ([place] (08/23/[place]), Seasonal allergies, and Spinal stenosis. He also  has a past surgical history that includes Shoulder surgery (Left, 2007); Nasal septum surgery (2004); ACDF with fusion (2007); Testicular torsion (1980s); Vasectomy; Colonoscopy with propofol (N/A, 06/26/2015); Cardiac catheterization (Left, 08/15/2016); Coronary/Graft Acute MI Revascularization (N/A, 08/23/2018); LEFT HEART CATH AND CORONARY ANGIOGRAPHY (N/A, 08/23/2018); Cystoscopy with biopsy (N/A, 11/22/2019); Cystoscopy/ureteroscopy/holmium laser/stent placement (Left, 11/22/2019); Shoulder arthroscopy w/ rotator cuff repair (10/01/2019); LEFT HEART CATH AND CORONARY ANGIOGRAPHY (N/A, 02/10/2020); Cystoscopy/ureteroscopy/holmium laser (02/10/2020); Cystoscopy w/ retrogrades (02/10/2020); and Extracorporeal shock wave lithotripsy (Left, 10/29/2020). Mr. CErneyhas a current medication list which includes the following prescription(s): allopurinol, amlodipine, aspirin ec, atorvastatin, cetirizine, diphenhydramine, trulicity, epinephrine, ezetimibe, famotidine, indomethacin, magnesium, nitroglycerin, onetouch delica lancets 362B  onetouch ultra, tizanidine, xigduo xr, hydralazine, metoprolol tartrate, pregabalin, rybelsus, and [DISCONTINUED] metformin. He  reports that he quit smoking about 18 years ago. His smoking use included cigarettes. He has a 10.00 pack-year smoking history. He has never used smokeless tobacco. He reports that he does not drink alcohol  and does not use drugs. Mr. Schirmer is allergic to imdur [isosorbide nitrate], lisinopril, other, drug [tape], tapentadol, and ace inhibitors.   HPI  Today, he is being contacted for a post-procedure assessment.   Post-Procedure Evaluation  Procedure (09/01/2021):  RIGHT GONB + Right T1-T2 ESI  Anxiolysis: Please see nurses note.  Effectiveness during initial hour after procedure (Ultra-Short Term Relief): 100 %   Local anesthetic used: Long-acting (4-6 hours) Effectiveness: Defined as any analgesic benefit obtained secondary to the administration of local anesthetics. This carries significant diagnostic value as to the etiological location, or anatomical origin, of the pain. Duration of benefit is expected to coincide with the duration of the local anesthetic used.  Effectiveness during initial 4-6 hours after procedure (Short-Term Relief): 100 %   Long-term benefit: Defined as any relief past the pharmacologic duration of the local anesthetics.  Effectiveness past the initial 6 hours after procedure (Long-Term Relief): 100 %   Benefits, current: Defined as benefit present at the time of this evaluation.   Analgesia:  80%-90% Function: Mr. Wiederholt reports improvement in function ROM: Mr. Monacelli reports improvement in ROM   Laboratory Chemistry Profile   Renal Lab Results  Component Value Date   BUN 19 11/01/2020   CREATININE 1.27 (H) 67/59/1638   BCR NOT APPLICABLE 46/65/9935   GFRAA >60 06/17/2020   GFRNONAA >60 11/01/2020    Hepatic Lab Results  Component Value Date   AST 36 10/30/2020   ALT 57 (H) 10/30/2020   ALBUMIN 4.9 10/30/2020   ALKPHOS 93  10/30/2020   LIPASE 45 10/31/2020    Electrolytes Lab Results  Component Value Date   NA 132 (L) 11/01/2020   K 3.4 (L) 11/01/2020   CL 97 (L) 11/01/2020   CALCIUM 9.1 11/01/2020   MG 2.3 02/12/2020   PHOS 1.9 (L) 08/27/2016    Bone Lab Results  Component Value Date   VD25OH 30 08/04/2017    Inflammation (CRP: Acute Phase) (ESR: Chronic Phase) Lab Results  Component Value Date   LATICACIDVEN 1.5 10/29/2020         Note: Above Lab results reviewed.  Imaging  DG Chest 2 View CLINICAL DATA:  Cough for a week, COVID exposure  EXAM: CHEST - 2 VIEW  COMPARISON:  10/30/2020  FINDINGS: The heart size and mediastinal contours are within normal limits. Both lungs are clear. The visualized skeletal structures are unremarkable.  IMPRESSION: No acute abnormality of the lungs.  Electronically Signed   By: Eddie Candle M.D.   On: 09/12/2021 09:31  Assessment  The primary encounter diagnosis was Cervical radicular pain. Diagnoses of Occipital neuralgia of right side, Chronic pain syndrome, and Lumbar radiculopathy were also pertinent to this visit.  Plan of Care  Problem-specific:  Occipital neuralgia of right side Overall Mr. Mierzwa is doing much better after his right greater occipital nerve block that was done on 09/01/2021.  Endorses decrease in posterior scalp pain frequency and intensity.  Repeat as needed.  Cervical radicular pain Good benefit after T1-T2 ESI.  Repeat as needed.  Chronic pain syndrome Repeat greater occipital nerve block, T1-T2 ESI as needed.  Requested Prescriptions   Signed Prescriptions Disp Refills   pregabalin (LYRICA) 50 MG capsule 90 capsule 5    Sig: 1 capsule in the morning and 2 capsules at night.   tiZANidine (ZANAFLEX) 4 MG tablet 90 tablet 5    Sig: Take 1 tablet (4 mg total) by mouth every 8 (eight) hours as needed for muscle spasms.  Mr. CHELSEA NUSZ has a current medication list which includes the following long-term  medication(s): allopurinol, amlodipine, ezetimibe, nitroglycerin, hydralazine, metoprolol tartrate, pregabalin, and [DISCONTINUED] metformin.  Pharmacotherapy (Medications Ordered): Meds ordered this encounter  Medications   pregabalin (LYRICA) 50 MG capsule    Sig: 1 capsule in the morning and 2 capsules at night.    Dispense:  90 capsule    Refill:  5   tiZANidine (ZANAFLEX) 4 MG tablet    Sig: Take 1 tablet (4 mg total) by mouth every 8 (eight) hours as needed for muscle spasms.    Dispense:  90 tablet    Refill:  5    Do not place this medication, or any other prescription from our practice, on "Automatic Refill". Patient may have prescription filled one day early if pharmacy is closed on scheduled refill date.     Follow-up plan:   Return if symptoms worsen or fail to improve.     Right T1-T2 ESI 07/21/20, 09/01/21; RIGHT GONB 09/01/21     Recent Visits Date Type Provider Dept  09/01/21 Procedure visit Gillis Santa, MD [facility]  08/11/21 Telemedicine Gillis Santa, MD [facility]  07/21/21 Procedure visit Gillis Santa, MD [facility]  07/19/21 Office Visit Gillis Santa, MD [facility]  07/13/21 Telemedicine Gillis Santa, MD [facility]  Showing recent visits within past 90 days and meeting all other requirements Today's Visits Date Type Provider Dept  10/06/21 Office Visit Gillis Santa, MD [facility]  Showing today's visits and meeting all other requirements Future Appointments No visits were found meeting these conditions. Showing future appointments within next 90 days and meeting all other requirements I discussed the assessment and treatment plan with the patient. The patient was provided an opportunity to ask questions and all were answered. The patient agreed with the plan and demonstrated an understanding of the instructions.  Patient advised to call back or seek an in-person evaluation if the symptoms or  condition worsens.  Duration of encounter: 91mnutes.  Note by: BGillis Santa MD Date: 10/06/2021; Time: 2:52 PM

## 2021-10-06 NOTE — Assessment & Plan Note (Signed)
Overall John Hartman is doing much better after his right greater occipital nerve block that was done on 09/01/2021.  Endorses decrease in posterior scalp pain frequency and intensity.  Repeat as needed.

## 2021-11-03 ENCOUNTER — Emergency Department: Payer: No Typology Code available for payment source

## 2021-11-03 ENCOUNTER — Other Ambulatory Visit: Payer: Self-pay

## 2021-11-03 ENCOUNTER — Inpatient Hospital Stay: Payer: No Typology Code available for payment source

## 2021-11-03 ENCOUNTER — Encounter: Payer: Self-pay | Admitting: *Deleted

## 2021-11-03 ENCOUNTER — Inpatient Hospital Stay
Admission: EM | Admit: 2021-11-03 | Discharge: 2021-11-05 | DRG: 287 | Disposition: A | Discharge status: Home / Self Care | Payer: No Typology Code available for payment source | Attending: Student | Admitting: Student

## 2021-11-03 DIAGNOSIS — Z7982 Long term (current) use of aspirin: Secondary | ICD-10-CM

## 2021-11-03 DIAGNOSIS — E785 Hyperlipidemia, unspecified: Secondary | ICD-10-CM | POA: Diagnosis present

## 2021-11-03 DIAGNOSIS — I214 Non-ST elevation (NSTEMI) myocardial infarction: Secondary | ICD-10-CM | POA: Diagnosis present

## 2021-11-03 DIAGNOSIS — Z87891 Personal history of nicotine dependence: Secondary | ICD-10-CM | POA: Diagnosis not present

## 2021-11-03 DIAGNOSIS — M109 Gout, unspecified: Secondary | ICD-10-CM | POA: Diagnosis present

## 2021-11-03 DIAGNOSIS — E1165 Type 2 diabetes mellitus with hyperglycemia: Secondary | ICD-10-CM | POA: Diagnosis present

## 2021-11-03 DIAGNOSIS — I110 Hypertensive heart disease with heart failure: Secondary | ICD-10-CM | POA: Diagnosis present

## 2021-11-03 DIAGNOSIS — I2510 Atherosclerotic heart disease of native coronary artery without angina pectoris: Secondary | ICD-10-CM | POA: Diagnosis present

## 2021-11-03 DIAGNOSIS — Z8249 Family history of ischemic heart disease and other diseases of the circulatory system: Secondary | ICD-10-CM

## 2021-11-03 DIAGNOSIS — I252 Old myocardial infarction: Secondary | ICD-10-CM | POA: Diagnosis not present

## 2021-11-03 DIAGNOSIS — Z955 Presence of coronary angioplasty implant and graft: Secondary | ICD-10-CM

## 2021-11-03 DIAGNOSIS — J101 Influenza due to other identified influenza virus with other respiratory manifestations: Secondary | ICD-10-CM | POA: Diagnosis present

## 2021-11-03 DIAGNOSIS — R0789 Other chest pain: Secondary | ICD-10-CM | POA: Diagnosis present

## 2021-11-03 DIAGNOSIS — E1142 Type 2 diabetes mellitus with diabetic polyneuropathy: Secondary | ICD-10-CM | POA: Diagnosis present

## 2021-11-03 DIAGNOSIS — Z7984 Long term (current) use of oral hypoglycemic drugs: Secondary | ICD-10-CM

## 2021-11-03 DIAGNOSIS — I5032 Chronic diastolic (congestive) heart failure: Secondary | ICD-10-CM | POA: Diagnosis present

## 2021-11-03 DIAGNOSIS — K760 Fatty (change of) liver, not elsewhere classified: Secondary | ICD-10-CM | POA: Diagnosis present

## 2021-11-03 DIAGNOSIS — R079 Chest pain, unspecified: Secondary | ICD-10-CM | POA: Diagnosis present

## 2021-11-03 DIAGNOSIS — Z808 Family history of malignant neoplasm of other organs or systems: Secondary | ICD-10-CM

## 2021-11-03 DIAGNOSIS — Z20822 Contact with and (suspected) exposure to covid-19: Secondary | ICD-10-CM | POA: Diagnosis present

## 2021-11-03 DIAGNOSIS — E119 Type 2 diabetes mellitus without complications: Secondary | ICD-10-CM

## 2021-11-03 DIAGNOSIS — K219 Gastro-esophageal reflux disease without esophagitis: Secondary | ICD-10-CM | POA: Diagnosis present

## 2021-11-03 DIAGNOSIS — Z833 Family history of diabetes mellitus: Secondary | ICD-10-CM

## 2021-11-03 DIAGNOSIS — Z79899 Other long term (current) drug therapy: Secondary | ICD-10-CM | POA: Diagnosis not present

## 2021-11-03 DIAGNOSIS — R7401 Elevation of levels of liver transaminase levels: Secondary | ICD-10-CM

## 2021-11-03 LAB — TROPONIN I (HIGH SENSITIVITY)
Troponin I (High Sensitivity): 9 ng/L (ref <18)
Troponin I (High Sensitivity): 8 ng/L (ref <18)
Troponin I (High Sensitivity): 8 ng/L (ref <18)
Troponin I (High Sensitivity): 7 ng/L (ref <18)

## 2021-11-03 LAB — BASIC METABOLIC PANEL
Anion gap: 13 (ref 5–15)
BUN: 14 mg/dL (ref 6–20)
CO2: 20 mmol/L — ABNORMAL LOW (ref 22–32)
Calcium: 9.7 mg/dL (ref 8.9–10.3)
Chloride: 102 mmol/L (ref 98–111)
Creatinine, Ser: 0.93 mg/dL (ref 0.61–1.24)
GFR, Estimated: >60 mL/min (ref >60)
Glucose, Bld: 122 mg/dL — ABNORMAL HIGH (ref 70–99)
Potassium: 4 mmol/L (ref 3.5–5.1)
Sodium: 135 mmol/L (ref 135–145)

## 2021-11-03 LAB — CBG MONITORING, ED: Glucose-Capillary: 117 mg/dL — ABNORMAL HIGH (ref 70–99)

## 2021-11-03 LAB — CBC
HCT: 50.4 % (ref 39.0–52.0)
Hemoglobin: 16.6 g/dL (ref 13.0–17.0)
MCH: 27.1 pg (ref 26.0–34.0)
MCHC: 32.9 g/dL (ref 30.0–36.0)
MCV: 82.2 fL (ref 80.0–100.0)
Platelets: 218 10*3/uL (ref 150–400)
RBC: 6.13 MIL/uL — ABNORMAL HIGH (ref 4.22–5.81)
RDW: 14.4 % (ref 11.5–15.5)
WBC: 7.3 10*3/uL (ref 4.0–10.5)
nRBC: 0 % (ref 0.0–0.2)

## 2021-11-03 LAB — RESP PANEL BY RT-PCR (FLU A&B, COVID) ARPGX2
Influenza A by PCR: POSITIVE — AB
Influenza B by PCR: NEGATIVE
SARS Coronavirus 2 by RT PCR: NEGATIVE

## 2021-11-03 LAB — LIPASE, BLOOD: Lipase: 89 U/L — ABNORMAL HIGH (ref 11–51)

## 2021-11-03 LAB — PROTIME-INR
INR: 1.2 (ref 0.8–1.2)
Prothrombin Time: 14.7 seconds (ref 11.4–15.2)

## 2021-11-03 LAB — HEPATIC FUNCTION PANEL
ALT: 136 U/L — ABNORMAL HIGH (ref 0–44)
AST: 125 U/L — ABNORMAL HIGH (ref 15–41)
Albumin: 4.2 g/dL (ref 3.5–5.0)
Alkaline Phosphatase: 82 U/L (ref 38–126)
Bilirubin, Direct: 0.3 mg/dL — ABNORMAL HIGH (ref 0.0–0.2)
Indirect Bilirubin: 1.6 mg/dL — ABNORMAL HIGH (ref 0.3–0.9)
Total Bilirubin: 1.9 mg/dL — ABNORMAL HIGH (ref 0.3–1.2)
Total Protein: 7.7 g/dL (ref 6.5–8.1)

## 2021-11-03 LAB — APTT: aPTT: 32 seconds (ref 24–36)

## 2021-11-03 LAB — MAGNESIUM: Magnesium: 2 mg/dL (ref 1.7–2.4)

## 2021-11-03 MED ORDER — POLYETHYLENE GLYCOL 3350 17 G PO PACK: 17.0000 g | PACK | Freq: Every day | ORAL | Status: DC | PRN

## 2021-11-03 MED ORDER — MORPHINE SULFATE (PF) 2 MG/ML IV SOLN: 2.0000 mg | INTRAVENOUS | Status: DC | PRN

## 2021-11-03 MED ORDER — METOPROLOL TARTRATE 25 MG PO TABS
25.0000 mg | ORAL_TABLET | Freq: Two times a day (BID) | ORAL | Status: DC
  Administered 2021-11-03 – 2021-11-05 (×4): 25 mg via ORAL
  Filled 2021-11-03 (×4): qty 1

## 2021-11-03 MED ORDER — HEPARIN (PORCINE) 25000 UT/250ML-% IV SOLN
1100.0000 [IU]/h | INTRAVENOUS | Status: DC
  Administered 2021-11-03: 1200 [IU]/h via INTRAVENOUS
  Filled 2021-11-03: qty 250

## 2021-11-03 MED ORDER — OSELTAMIVIR PHOSPHATE 75 MG PO CAPS
75.0000 mg | ORAL_CAPSULE | Freq: Two times a day (BID) | ORAL | Status: DC
  Administered 2021-11-03: 75 mg via ORAL
  Filled 2021-11-03 (×5): qty 1

## 2021-11-03 MED ORDER — OSELTAMIVIR PHOSPHATE 30 MG PO CAPS
30.0000 mg | ORAL_CAPSULE | Freq: Two times a day (BID) | ORAL | Status: DC
  Filled 2021-11-03: qty 1

## 2021-11-03 MED ORDER — SODIUM CHLORIDE 0.9 % IV BOLUS
1000.0000 mL | Freq: Once | INTRAVENOUS | Status: AC
  Administered 2021-11-03: 1000 mL via INTRAVENOUS

## 2021-11-03 MED ORDER — IPRATROPIUM-ALBUTEROL 0.5-2.5 (3) MG/3ML IN SOLN: 3.0000 mL | RESPIRATORY_TRACT | Status: DC | PRN

## 2021-11-03 MED ORDER — PANTOPRAZOLE SODIUM 40 MG IV SOLR
40.0000 mg | Freq: Once | INTRAVENOUS | Status: AC
  Administered 2021-11-03: 40 mg via INTRAVENOUS
  Filled 2021-11-03: qty 40

## 2021-11-03 MED ORDER — FAMOTIDINE 20 MG PO TABS
10.0000 mg | ORAL_TABLET | Freq: Every day | ORAL | Status: DC
  Administered 2021-11-04 – 2021-11-05 (×2): 10 mg via ORAL
  Filled 2021-11-03 (×2): qty 1

## 2021-11-03 MED ORDER — PREGABALIN 75 MG PO CAPS
100.0000 mg | ORAL_CAPSULE | Freq: Every day | ORAL | Status: DC
  Administered 2021-11-03 – 2021-11-04 (×2): 100 mg via ORAL
  Filled 2021-11-03: qty 2
  Filled 2021-11-03: qty 1

## 2021-11-03 MED ORDER — NITROGLYCERIN 0.4 MG SL SUBL
0.4000 mg | SUBLINGUAL_TABLET | SUBLINGUAL | Status: DC | PRN
  Administered 2021-11-03 (×3): 0.4 mg via SUBLINGUAL
  Filled 2021-11-03: qty 1

## 2021-11-03 MED ORDER — IOHEXOL 350 MG/ML SOLN
100.0000 mL | Freq: Once | INTRAVENOUS | Status: AC | PRN
  Administered 2021-11-03: 100 mL via INTRAVENOUS

## 2021-11-03 MED ORDER — ENOXAPARIN SODIUM 40 MG/0.4ML IJ SOSY
40.0000 mg | PREFILLED_SYRINGE | INTRAMUSCULAR | Status: DC
  Administered 2021-11-03: 40 mg via SUBCUTANEOUS
  Filled 2021-11-03: qty 0.4

## 2021-11-03 MED ORDER — NITROGLYCERIN IN D5W 200-5 MCG/ML-% IV SOLN
0.0000 ug/min | INTRAVENOUS | Status: DC
  Administered 2021-11-03: 30 ug/min via INTRAVENOUS
  Filled 2021-11-03: qty 250

## 2021-11-03 MED ORDER — ACETAMINOPHEN 325 MG PO TABS
650.0000 mg | ORAL_TABLET | ORAL | Status: DC | PRN
  Administered 2021-11-04 – 2021-11-05 (×4): 650 mg via ORAL
  Filled 2021-11-03 (×4): qty 2

## 2021-11-03 MED ORDER — HEPARIN BOLUS VIA INFUSION
4000.0000 [IU] | Freq: Once | INTRAVENOUS | Status: AC
  Administered 2021-11-03: 4000 [IU] via INTRAVENOUS
  Filled 2021-11-03: qty 4000

## 2021-11-03 MED ORDER — PREGABALIN 25 MG PO CAPS
50.0000 mg | ORAL_CAPSULE | Freq: Every day | ORAL | Status: DC
  Administered 2021-11-04 – 2021-11-05 (×2): 50 mg via ORAL
  Filled 2021-11-03 (×2): qty 2

## 2021-11-03 MED ORDER — ATORVASTATIN CALCIUM 20 MG PO TABS
80.0000 mg | ORAL_TABLET | Freq: Every day | ORAL | Status: DC
  Administered 2021-11-03 – 2021-11-04 (×2): 80 mg via ORAL
  Filled 2021-11-03 (×2): qty 4

## 2021-11-03 MED ORDER — ASPIRIN EC 81 MG PO TBEC
81.0000 mg | DELAYED_RELEASE_TABLET | Freq: Every day | ORAL | Status: DC
  Administered 2021-11-05: 81 mg via ORAL
  Filled 2021-11-03: qty 1

## 2021-11-03 MED ORDER — MORPHINE SULFATE (PF) 4 MG/ML IV SOLN
4.0000 mg | Freq: Once | INTRAVENOUS | Status: AC
  Administered 2021-11-03: 4 mg via INTRAVENOUS
  Filled 2021-11-03: qty 1

## 2021-11-03 MED ORDER — GUAIFENESIN-DM 100-10 MG/5ML PO SYRP: 5.0000 mL | ORAL_SOLUTION | ORAL | Status: DC | PRN

## 2021-11-03 MED ORDER — ONDANSETRON HCL 4 MG/2ML IJ SOLN
4.0000 mg | Freq: Once | INTRAMUSCULAR | Status: AC
  Administered 2021-11-03: 4 mg via INTRAVENOUS
  Filled 2021-11-03: qty 2

## 2021-11-03 MED ORDER — ONDANSETRON HCL 4 MG/2ML IJ SOLN
4.0000 mg | Freq: Four times a day (QID) | INTRAMUSCULAR | Status: DC | PRN
  Administered 2021-11-03 – 2021-11-04 (×2): 4 mg via INTRAVENOUS
  Filled 2021-11-03 (×2): qty 2

## 2021-11-03 MED ORDER — LACTATED RINGERS IV SOLN: INTRAVENOUS | Status: AC

## 2021-11-03 MED ORDER — OXYCODONE HCL 5 MG PO TABS
5.0000 mg | ORAL_TABLET | Freq: Four times a day (QID) | ORAL | Status: DC | PRN
  Administered 2021-11-04: 5 mg via ORAL
  Filled 2021-11-03: qty 1

## 2021-11-03 MED ORDER — PREGABALIN 50 MG PO CAPS: 50.0000 mg | ORAL_CAPSULE | Freq: Two times a day (BID) | ORAL | Status: DC

## 2021-11-03 MED ORDER — EZETIMIBE 10 MG PO TABS
10.0000 mg | ORAL_TABLET | Freq: Every day | ORAL | Status: DC
  Administered 2021-11-04 – 2021-11-05 (×2): 10 mg via ORAL
  Filled 2021-11-03 (×3): qty 1

## 2021-11-03 MED ORDER — HYDROMORPHONE HCL 1 MG/ML IJ SOLN
1.0000 mg | INTRAMUSCULAR | Status: DC | PRN
  Administered 2021-11-03: 1 mg via INTRAVENOUS
  Filled 2021-11-03: qty 1

## 2021-11-03 MED ORDER — METOPROLOL TARTRATE 5 MG/5ML IV SOLN
5.0000 mg | Freq: Once | INTRAVENOUS | Status: AC
  Administered 2021-11-03: 5 mg via INTRAVENOUS
  Filled 2021-11-03: qty 5

## 2021-11-03 MED ORDER — ASPIRIN 325 MG PO TABS
325.0000 mg | ORAL_TABLET | ORAL | Status: AC
  Filled 2021-11-03: qty 1

## 2021-11-03 NOTE — Progress Notes (Addendum)
Discussed with cardiology, Dr. Saralyn Pilar, patient started on heparin drip until seen by cardiology.  Admission status upgraded to stepdown unit, inpatient status, due to persistent symptomatology.

## 2021-11-03 NOTE — H&P (Signed)
History and Physical  [name]:[id number] DOB: 1971-10-15 DOA: 11/03/2021  Referring physician: Dr. Joni Fears, [place]. PCP: Juanda Bond, NP  Outpatient Specialists: Cardiology. Patient coming from: Home.  Chief Complaint: Chest pain  HPI: John Hartman is a 50 y.o. male with medical history significant for aortic atherosclerosis, HFpEF, NSTEMI, coronary artery disease status post PCI with stent placement to mid left circumflex, hyperlipidemia, type 2 diabetes, diabetic polyneuropathy, OSA, who presented to [facility] ED due to sudden onset nonexertional chest pain around 2:20 PM.  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  His pain was severe, sharp radiated to his back.  Associated with nausea, no vomiting, and dyspnea at rest.  His son was diagnosed with the flu a few days ago.  Denies any fevers or significant respiratory symptoms.  Upon presentation to the ED, troponin negative x2, EKG no evidence of acute ischemia, influenza A positive.  EDP discussed case with the patient's cardiologist who recommended holding off heparin drip for now but to start heparin drip if high-sensitivity troponin turn positive.  Due to persistent chest pain patient was started on nitro drip in the ED.  TRH, hospitalist team, was asked to admit.  ED Course: T-max 99.1.  BP 140/101, pulse 120, respiratory rate 22, saturation 98% on room air.  Lab studies significant for acute transaminitis, AST 125, ALT 126, T bilirubin 1.9.  Review of Systems: Review of systems as noted in the HPI. All other systems reviewed and are negative.   Past Medical History:  Diagnosis Date   Angioedema    Bronchitis    DDD (degenerative disc disease), cervical    Diabetes mellitus ([place])    Fatty liver 03/31/2017   [place] April 2018   GERD (gastroesophageal reflux disease)    Gout    Heart attack ([place])    HTN (hypertension)    Hypertension    CONTROLLED ON MEDS   IBS (irritable bowel syndrome)    Kidney stone    LVH  (left ventricular hypertrophy) 11/24/2018   Moderate, ECHO   Myocardial infarction ([place]) 08/23/2018   Seasonal allergies    Spinal stenosis    Past Surgical History:  Procedure Laterality Date   ACDF with fusion  2007   CARDIAC CATHETERIZATION Left 08/15/2016   Procedure: Left Heart Cath and Coronary Angiography;  Surgeon: Wellington Hampshire, MD;  Location: [place] [facility];  Service: Cardiovascular;  Laterality: Left;   COLONOSCOPY WITH PROPOFOL N/A 06/26/2015   Procedure: COLONOSCOPY WITH PROPOFOL;  Surgeon: Lucilla Lame, MD;  Location: [facility];  Service: Endoscopy;  Laterality: N/A;  with biopsies   CORONARY/GRAFT ACUTE MI REVASCULARIZATION N/A 08/23/2018   Procedure: Coronary/Graft Acute MI Revascularization;  Surgeon: Isaias Cowman, MD;  Location: [place] [facility];  Service: Cardiovascular;  Laterality: N/A;   CYSTOSCOPY W/ RETROGRADES  02/10/2020   Procedure: CYSTOSCOPY WITH RETROGRADE PYELOGRAM;  Surgeon: Billey Co, MD;  Location: [facility];  Service: Urology;;   CYSTOSCOPY WITH BIOPSY N/A 11/22/2019   Procedure: CYSTOSCOPY WITH Bladder BIOPSY & Almyra Free;  Surgeon: Billey Co, MD;  Location: [facility];  Service: Urology;  Laterality: N/A;   CYSTOSCOPY/URETEROSCOPY/HOLMIUM LASER  02/10/2020   Procedure: CYSTOSCOPY/URETEROSCOPY/HOLMIUM LASER;  Surgeon: Billey Co, MD;  Location: [facility];  Service: Urology;;   CYSTOSCOPY/URETEROSCOPY/HOLMIUM LASER/STENT PLACEMENT Left 11/22/2019   Procedure: CYSTOSCOPY/URETEROSCOPY/BILATERAL RETROGRADE PYELOGRAM/HOLMIUM LASER/STENT PLACEMENT;  Surgeon: Billey Co, MD;  Location: [facility];  Service: Urology;  Laterality: Left;   EXTRACORPOREAL SHOCK  WAVE LITHOTRIPSY Left 10/29/2020   Procedure: EXTRACORPOREAL SHOCK WAVE LITHOTRIPSY (ESWL);  Surgeon: Hollice Espy, MD;  Location: [facility];  Service: Urology;  Laterality: Left;   LEFT HEART CATH AND CORONARY ANGIOGRAPHY N/A 08/23/2018   Procedure: LEFT HEART  CATH AND CORONARY ANGIOGRAPHY;  Surgeon: Isaias Cowman, MD;  Location: [place] [facility];  Service: Cardiovascular;  Laterality: N/A;   LEFT HEART CATH AND CORONARY ANGIOGRAPHY N/A 02/10/2020   Procedure: LEFT HEART CATH AND CORONARY ANGIOGRAPHY;  Surgeon: Corey Skains, MD;  Location: [place] [facility];  Service: Cardiovascular;  Laterality: N/A;   NASAL SEPTUM SURGERY  2004   SHOULDER ARTHROSCOPY W/ ROTATOR CUFF REPAIR  10/01/2019   SHOULDER SURGERY Left 2007   Testicular torsion  1980s   VASECTOMY      Social History:  reports that he quit smoking about 18 years ago. His smoking use included cigarettes. He has a 10.00 pack-year smoking history. He has never used smokeless tobacco. He reports that he does not drink alcohol and does not use drugs.   Allergies  Allergen Reactions   Imdur [Isosorbide Nitrate]    Lisinopril Swelling   Other Hives    adhesives   Drug [Tape] Itching    Rash, blisters   Tapentadol Itching    Rash, blisters   Ace Inhibitors Swelling    Family History  Problem Relation Age of Onset   Breast cancer Mother    Skin cancer Mother    Arrhythmia Mother        A-fib   Cancer Mother        breast, colon?, skin   Hypertension Father    Diabetes Father    Cancer Maternal Grandfather    Heart disease Brother        stent   Allergies Son    Alzheimer's disease Maternal Grandmother    Stroke Neg Hx    COPD Neg Hx       Prior to Admission medications   Medication Sig Start Date End Date Taking? Authorizing Provider  allopurinol (ZYLOPRIM) 100 MG tablet Take 100 mg by mouth daily.     [provider]  amLODipine (NORVASC) 10 MG tablet Take 1 tablet (10 mg total) by mouth daily. 02/13/20   Ezekiel Slocumb, DO  aspirin EC 81 MG tablet Take 81 mg by mouth daily.    [provider]  atorvastatin (LIPITOR) 80 MG tablet Take 80 mg by mouth daily. 08/02/21   [provider]  cetirizine (ZYRTEC) 10 MG tablet Take 10 mg  by mouth at bedtime.    [provider]  diphenhydrAMINE (BENADRYL) 25 MG tablet Take 50 mg by mouth at bedtime.    [provider]  Dulaglutide (TRULICITY) 1.5 EY/8.1KG SOPN Inject into the skin once a week.    [provider]  EPINEPHrine 0.3 mg/0.3 mL IJ SOAJ injection Inject into the muscle as directed. 05/11/21   [provider]  ezetimibe (ZETIA) 10 MG tablet Take 1 tablet (10 mg total) by mouth daily. 12/03/18   Arnetha Courser, MD  famotidine (PEPCID) 10 MG tablet Take 10 mg by mouth daily.    [provider]  hydrALAZINE (APRESOLINE) 25 MG tablet Take 1 tablet (25 mg total) by mouth 2 (two) times daily. 08/25/18 09/12/21  Saundra Shelling, MD  indomethacin (INDOCIN) 25 MG capsule Take 1 capsule (25 mg total) by mouth 3 (three) times daily as needed. 03/18/21   Verda Cumins, MD  Magnesium 500 MG  CAPS Take by mouth.    [provider]  metoprolol tartrate (LOPRESSOR) 25 MG tablet Take 2 tablets (50 mg total) by mouth 2 (two) times daily. 11/01/20 09/12/21  Dessa Phi, DO  nitroGLYCERIN (NITROSTAT) 0.4 MG SL tablet Place 1 tablet (0.4 mg total) under the tongue every 5 (five) minutes as needed for chest pain. 08/25/18   Saundra Shelling, MD  OneTouch Delica Lancets 62H MISC Apply 1 each topically 2 (two) times daily. 03/02/21   [provider]  Donald Siva test strip SMARTSIG:Via Meter 08/19/20   [provider]  pregabalin (LYRICA) 50 MG capsule 1 capsule in the morning and 2 capsules at night. 10/06/21   Gillis Santa, MD  RYBELSUS 3 MG TABS Take 3 mg by mouth daily at 6 (six) AM.     [provider]  tiZANidine (ZANAFLEX) 4 MG tablet Take 1 tablet (4 mg total) by mouth every 8 (eight) hours as needed for muscle spasms. 10/06/21 04/04/22  Gillis Santa, MD  XIGDUO XR 04-999 MG TB24 Take 1 tablet by mouth 2 (two) times daily. 06/30/21   [provider]  metFORMIN (GLUCOPHAGE) 1000 MG tablet Take 1 tablet (1,000  mg total) by mouth 2 (two) times daily with a meal. 11/01/20 12/16/20  Dessa Phi, DO    Physical Exam: BP (!) 140/101   Pulse (!) 120   Temp 99.1 F (37.3 C) (Oral)   Resp (!) 22   Ht 5\' 11"  (1.803 m)   Wt 93 kg   SpO2 98%   BMI 28.59 kg/m   General: 50 y.o. year-old male well developed well nourished in no acute distress.  Alert and oriented x3. Cardiovascular: Tachycardic with no rubs or gallops.  No thyromegaly or JVD noted.  No lower extremity edema. 2/4 pulses in all 4 extremities. Respiratory: Clear to auscultation with no wheezes or rales. Good inspiratory effort. Abdomen: Soft nontender nondistended with normal bowel sounds x4 quadrants. Muskuloskeletal: No cyanosis, clubbing or edema noted bilaterally Neuro: CN II-XII intact, strength, sensation, reflexes Skin: No ulcerative lesions noted or rashes Psychiatry: Judgement and insight appear normal. Mood is appropriate for condition and setting          Labs on Admission:  Basic Metabolic Panel: Recent Labs  Lab 11/03/21 1609 11/03/21 1700  NA 135  --   K 4.0  --   CL 102  --   CO2 20*  --   GLUCOSE 122*  --   BUN 14  --   CREATININE 0.93  --   CALCIUM 9.7  --   MG  --  2.0   Liver Function Tests: Recent Labs  Lab 11/03/21 1700  AST 125*  ALT 136*  ALKPHOS 82  BILITOT 1.9*  PROT 7.7  ALBUMIN 4.2   Recent Labs  Lab 11/03/21 1700  LIPASE 89*   No results for input(s): AMMONIA in the last 168 hours. CBC: Recent Labs  Lab 11/03/21 1609  WBC 7.3  HGB 16.6  HCT 50.4  MCV 82.2  PLT 218   Cardiac Enzymes: No results for input(s): CKTOTAL, CKMB, CKMBINDEX, TROPONINI in the last 168 hours.  BNP (last 3 results) No results for input(s): BNP in the last 8760 hours.  ProBNP (last 3 results) No results for input(s): PROBNP in the last 8760 hours.  CBG: No results for input(s): GLUCAP in the last 168 hours.  Radiological Exams on Admission: CT Angio Chest/Abd/Pel for Dissection W and/or Wo  Contrast  Result Date: 11/03/2021 CLINICAL  DATA:  Chest pain or back pain, aortic dissection suspected. Pt has left side chest pain for 2 hours with nausea. Pt took 2 ntg without relief. No sob. Pt alert speech clear. EXAM: CT ANGIOGRAPHY CHEST, ABDOMEN AND PELVIS TECHNIQUE: Non-contrast CT of the chest was initially obtained. Multidetector CT imaging through the chest, abdomen and pelvis was performed using the standard protocol during bolus administration of intravenous contrast. Multiplanar reconstructed images and MIPs were obtained and reviewed to evaluate the vascular anatomy. CONTRAST:  173mL OMNIPAQUE IOHEXOL 350 MG/ML SOLN COMPARISON:  Chest x-ray 09/13/2021, CT angio chest 10/29/2020 FINDINGS: CTA CHEST FINDINGS Cardiovascular: Preferential opacification of the thoracic aorta. No evidence of thoracic aortic aneurysm or dissection. Normal heart size. No significant pericardial effusion. The thoracic aorta is normal in caliber. Mild atherosclerotic plaque of the thoracic aorta. Four-vessel coronary artery calcifications. Mediastinum/Nodes: No enlarged mediastinal, hilar, or axillary lymph nodes. Thyroid gland, trachea, and esophagus demonstrate no significant findings. Lungs/Pleura: No focal consolidation. Couple scattered pulmonary micronodules that are calcified and noncalcified. No pulmonary mass. No pleural effusion. No pneumothorax. Musculoskeletal: No chest wall abnormality. No suspicious lytic or blastic osseous lesions. No acute displaced fracture. Multilevel degenerative changes of the spine. Review of the MIP images confirms the above findings. CTA ABDOMEN AND PELVIS FINDINGS VASCULAR Aorta: Severe atherosclerotic plaque. Normal caliber aorta without aneurysm, dissection, vasculitis or significant stenosis. Celiac: Mild atherosclerotic plaque. Patent without evidence of aneurysm, dissection, vasculitis or significant stenosis. SMA: Patent without evidence of aneurysm, dissection, vasculitis or  significant stenosis. Renals: Mild atherosclerotic plaque. Both renal arteries are patent without evidence of aneurysm, dissection, vasculitis, fibromuscular dysplasia or significant stenosis. IMA: Patent without evidence of aneurysm, dissection, vasculitis or significant stenosis. Inflow: Patent without evidence of aneurysm, dissection, vasculitis or significant stenosis. Veins: No obvious venous abnormality within the limitations of this arterial phase study. Review of the MIP images confirms the above findings. NON-VASCULAR Hepatobiliary: No focal liver abnormality. No gallstones, gallbladder wall thickening, or pericholecystic fluid. No biliary dilatation. Pancreas: No focal lesion. Normal pancreatic contour. No surrounding inflammatory changes. No main pancreatic ductal dilatation. Spleen: Normal in size without focal abnormality. Adrenals/Urinary Tract: No adrenal nodule bilaterally. Bilateral kidneys enhance symmetrically. No hydronephrosis. No hydroureter. The urinary bladder is unremarkable. Stomach/Bowel: Stomach is within normal limits. No evidence of bowel wall thickening or dilatation. Stool throughout the colon. Appendix is normal in caliber with no associated inflammatory changes. Stool is noted within the appendiceal lumen. Lymphatic: No lymphadenopathy. Reproductive: Prominent prostate. Other: No intraperitoneal free fluid. No intraperitoneal free gas. No organized fluid collection. Musculoskeletal: No abdominal wall hernia or abnormality. No suspicious lytic or blastic osseous lesions. No acute displaced fracture. Multilevel severe degenerative changes of the spine. Review of the MIP images confirms the above findings. IMPRESSION: 1. No acute vascular abnormality. 2. Aortic Atherosclerosis (ICD10-I70.0) including four-vessel coronary artery calcifications. 3. Constipation. 4. Severe multilevel degenerative changes of the lumbar spine. Electronically Signed   By: Iven Finn M.D.   On:  11/03/2021 17:05    EKG: I independently viewed the EKG done and my findings are as followed: Sinus tachycardia rate of 118, nonspecific ST-T changes.  QTc 454.  Assessment/Plan Present on Admission:  Chest pain  Active Problems:   Chest pain  Chest pain rule out ACS History of NSTEMI, coronary disease status post PCI with stent placement Troponin negative x2, twelve-lead EKG nonacute. CT angio chest/abdomen/pelvis for dissection with and without contrast nonacute. Obtain serial troponin, serial 12 EKG. Obtain 2D echo  EDP discussed  case with the patient's cardiologist who recommended holding off heparin drip for now but to start heparin drip if troponin were positive.  Due to persistent chest pain patient was started on nitro drip in the ED, continue.  As needed analgesics  Influenza A infection Influenza A+ on 11/03/2021 Droplet precautions Start Tamiflu 75 mg twice daily x5 days. As needed bronchodilators As needed antitussives  Acute transaminitis suspect in the setting of viral infection. Obtain acute hepatitis panel Obtain right upper quadrant abdominal ultrasound Trend LFTs  Chronic diastolic CHF Coronary artery disease status post PCI with stent placement Hyperlipidemia Resume home cardiac regimen.  Type 2 diabetes with hyperglycemia Obtain hemoglobin A1c Start insulin sliding scale.  Essential hypertension Resume home oral antihypertensives Monitor vital signs  Hyperlipidemia Resume home Lipitor   DVT prophylaxis: Subcu Lovenox daily  Code Status: Full code  Family Communication: Wife at bedside.  Disposition Plan: Admitted to telemetry cardiac.  Consults called: Cardiology consulted  Admission status: Observation status   Status is: Observation        Kayleen Memos MD [facility] Pager [phone]  If 7PM-7AM, please contact night-coverage [email or web address] Password [email or web address]  11/03/2021, 7:20 PM

## 2021-11-03 NOTE — Progress Notes (Signed)
ANTICOAGULATION CONSULT NOTE - Initial Consult  Pharmacy Consult for Heparin drip Indication: chest pain/ACS  Allergies  Allergen Reactions   Imdur [Isosorbide Nitrate]    Lisinopril Swelling   Other Hives    adhesives   Drug [Tape] Itching    Rash, blisters   Tapentadol Itching    Rash, blisters   Ace Inhibitors Swelling    Patient Measurements: Height: 5\' 11"  (180.3 cm) Weight: 93 kg (205 lb) IBW/kg (Calculated) : 75.3 Heparin Dosing Weight: 93 kg  Vital Signs: Temp: 99.1 F (37.3 C) (11/09 1609) Temp Source: Oral (11/09 1609) BP: 135/104 (11/09 2130) Pulse Rate: 131 (11/09 2130)  Labs: Recent Labs    11/03/21 1609 11/03/21 1739 11/03/21 1930 11/03/21 2118  HGB 16.6  --   --   --   HCT 50.4  --   --   --   PLT 218  --   --   --   APTT  --   --   --  32  LABPROT  --   --   --  14.7  INR  --   --   --  1.2  CREATININE 0.93  --   --   --   TROPONINIHS 9 8 8 7     Estimated Creatinine Clearance: 113.2 mL/min (by C-G formula based on SCr of 0.93 mg/dL).   Medical History: Past Medical History:  Diagnosis Date   Angioedema    Bronchitis    DDD (degenerative disc disease), cervical    Diabetes mellitus ([place])    Fatty liver 03/31/2017   [place] April 2018   GERD (gastroesophageal reflux disease)    Gout    Heart attack ([place])    HTN (hypertension)    Hypertension    CONTROLLED ON MEDS   IBS (irritable bowel syndrome)    Kidney stone    LVH (left ventricular hypertrophy) 11/24/2018   Moderate, ECHO   Myocardial infarction ([place]) 08/23/2018   Seasonal allergies    Spinal stenosis     Medications:  Scheduled:   [START ON 11/04/2021] aspirin EC  81 mg Oral Daily   aspirin  325 mg Oral STAT   atorvastatin  80 mg Oral QHS   [START ON 11/04/2021] ezetimibe  10 mg Oral Daily   [START ON 11/04/2021] famotidine  10 mg Oral Daily   metoprolol tartrate  25 mg Oral BID   oseltamivir  75 mg Oral BID   pregabalin  100 mg Oral QHS   [START ON 11/04/2021]  pregabalin  50 mg Oral Daily   Infusions:   heparin 1,200 Units/hr (11/03/21 2140)   lactated ringers 50 mL/hr at 11/03/21 2129   nitroGLYCERIN 50 mcg/min (11/03/21 2141)    Assessment: 50 yo M to start Heparin drip for Chest pain/ACS. No anticoagulants PTA per Med Rec Baseline labs:  Hgb 16.6  Plt 218  INR 1.2  aPTT 32  Goal of Therapy:  Heparin level 0.3-0.7 units/ml Monitor platelets by anticoagulation protocol: Yes   Plan:  Give 4000 units bolus x 1 Start heparin infusion at 1200 units/hr Check anti-Xa level in 6 hours and daily while on heparin Continue to monitor H&H and platelets  Raynell Upton A 11/03/2021,10:13 PM

## 2021-11-03 NOTE — ED Notes (Signed)
Peripheral Ivs inserted and ED provider at bedside.  Pt has experienced 2 waves of severe CP 8/10 radiation to back, now 3/10. Pt also becomes nauseous at onset of intense pain each time.

## 2021-11-03 NOTE — ED Provider Notes (Signed)
[facility] Emergency Department Provider Note  ____________________________________________  Time seen: Approximately 4:24 PM  I have reviewed the triage vital signs and the nursing notes.   HISTORY  Chief Complaint Chest Pain    HPI John Hartman is a 50 y.o. male with a history of hypertension hyperlipidemia diabetes and CAD status post stent to the LAD 3 years ago by Dr. Saralyn Pilar who comes the ED complaining of chest pain that started suddenly around 2:00 PM today.  It is constant, waxing and waning between 3/10 and 10/10 intensity, associated with nausea, radiating to the back.  Worsened when he tried to stand up.  No alleviating factors.  Does not take any blood thinners.  He takes a baby aspirin daily.  Prior to this he was in his usual state of health except for a cough that he has had since yesterday.   Past Medical History:  Diagnosis Date   Angioedema    Bronchitis    DDD (degenerative disc disease), cervical    Diabetes mellitus ([place])    Fatty liver 03/31/2017   [place] April 2018   GERD (gastroesophageal reflux disease)    Gout    Heart attack ([place])    HTN (hypertension)    Hypertension    CONTROLLED ON MEDS   IBS (irritable bowel syndrome)    Kidney stone    LVH (left ventricular hypertrophy) 11/24/2018   Moderate, ECHO   Myocardial infarction ([place]) 08/23/2018   Seasonal allergies    Spinal stenosis      Patient Active Problem List   Diagnosis Date Noted   Occipital neuralgia of right side 10/06/2021   Pain management contract signed 07/19/2021   Cervical radicular pain 07/13/2021   Perinephric hematoma 10/29/2020   Back muscle spasm 10/28/2020   Chronic pain syndrome 04/30/2020   Lumbar radiculopathy 04/30/2020   Spinal stenosis of lumbar region with neurogenic claudication 04/30/2020   S/P cervical spinal fusion 04/30/2020   HFrEF (heart failure with reduced ejection fraction) ([place]) 02/18/2020   Nephrolithiasis 02/18/2020    OSA (obstructive sleep apnea) 02/17/2020   Right ureteral stone 02/07/2020   Diabetes mellitus without complication ([place]) 24/58/0998   Chronic systolic CHF (congestive heart failure) ([place]) 02/07/2020   Unspecified inflammatory spondylopathy, 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[place]) 01/06/2020   Arthritis of right acromioclavicular joint 09/23/2019   Incomplete tear of right rotator cuff 09/23/2019   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   H/O non-ST elevation myocardial infarction (NSTEMI) 11/24/2018   LVH (left ventricular hypertrophy) 11/24/2018   Retrolisthesis of vertebrae 11/24/2018   History of angioedema 09/01/2018   NSTEMI (non-ST elevated myocardial infarction) ([place]) 08/22/2018   Chronic lower back pain 08/04/2017   Degenerative disc disease, lumbar 08/04/2017   Lumbar facet arthropathy 08/04/2017   Calcification of abdominal aorta ([place]) 08/04/2017   NAFL (nonalcoholic fatty liver) 33/82/5053   Hyperlipidemia LDL goal <70 01/04/2017   Elevated serum glutamic pyruvic transaminase (SGPT) level 01/04/2017   Coronary artery disease 08/25/2016   Ketonuria 07/12/2016   Glucosuria 07/12/2016   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/2015   Family history of malignant neoplasm of gastrointestinal tract    Benign neoplasm of rectosigmoid junction    Essential hypertension 05/28/2015   Gout 05/28/2015   IBS (irritable bowel syndrome) 05/28/2015     Past Surgical History:  Procedure Laterality Date   ACDF with fusion  2007   CARDIAC CATHETERIZATION Left 08/15/2016   Procedure: Left Heart Cath and Coronary Angiography;  Surgeon: Wellington Hampshire, MD;  Location: [place] INVASIVE CV  LAB;  Service: Cardiovascular;  Laterality: Left;   COLONOSCOPY WITH PROPOFOL N/A 06/26/2015   Procedure: COLONOSCOPY WITH PROPOFOL;  Surgeon: Lucilla Lame, MD;  Location: [place];  Service: Endoscopy;  Laterality: N/A;  with biopsies   CORONARY/GRAFT ACUTE MI REVASCULARIZATION N/A 08/23/2018   Procedure: Coronary/Graft Acute MI  Revascularization;  Surgeon: Isaias Cowman, MD;  Location: [place] CV LAB;  Service: Cardiovascular;  Laterality: N/A;   CYSTOSCOPY W/ RETROGRADES  02/10/2020   Procedure: CYSTOSCOPY WITH RETROGRADE PYELOGRAM;  Surgeon: Billey Co, MD;  Location: [facility] ORS;  Service: Urology;;   CYSTOSCOPY WITH BIOPSY N/A 11/22/2019   Procedure: CYSTOSCOPY WITH Bladder BIOPSY & Almyra Free;  Surgeon: Billey Co, MD;  Location: [facility] OR[facility];  Service: Urology;  Laterality: N/A;   CYSTOSCOPY/URETEROSCOPY/HOLMIUM LASER  02/10/2020   Procedure: CYSTOSCOPY/URETEROSCOPY/HOLMIUM LASER;  Surgeon: Billey Co, MD;  Location: [facility] OR[facility];  Service: Urology;;   CYSTOSCOPY/URETEROSCOPY/HOLMIUM LASER/STENT PLACEMENT Left 11/22/2019   Procedure: CYSTOSCOPY/URETEROSCOPY/BILATERAL RETROGRADE PYELOGRAM/HOLMIUM LASER/STENT PLACEMENT;  Surgeon: Billey Co, MD;  Location: [facility];  Service: Urology;  Laterality: Left;   EXTRACORPOREAL SHOCK WAVE LITHOTRIPSY Left 10/29/2020   Procedure: EXTRACORPOREAL SHOCK WAVE LITHOTRIPSY (ESWL);  Surgeon: Hollice Espy, MD;  Location: [facility];  Service: Urology;  Laterality: Left;   LEFT HEART CATH AND CORONARY ANGIOGRAPHY N/A 08/23/2018   Procedure: LEFT HEART CATH AND CORONARY ANGIOGRAPHY;  Surgeon: Isaias Cowman, MD;  Location: [place] CV LAB;  Service: Cardiovascular;  Laterality: N/A;   LEFT HEART CATH AND CORONARY ANGIOGRAPHY N/A 02/10/2020   Procedure: LEFT HEART CATH AND CORONARY ANGIOGRAPHY;  Surgeon: Corey Skains, MD;  Location: [place] CV LAB;  Service: Cardiovascular;  Laterality: N/A;   NASAL SEPTUM SURGERY  2004   SHOULDER ARTHROSCOPY W/ ROTATOR CUFF REPAIR  10/01/2019   SHOULDER SURGERY Left 2007   Testicular torsion  1980s   VASECTOMY       Prior to Admission medications   Medication Sig Start Date End Date Taking? Authorizing Provider  allopurinol (ZYLOPRIM) 100 MG tablet Take 100 mg by mouth daily.     [provider]  amLODipine (NORVASC) 10 MG tablet Take 1 tablet (10 mg total) by mouth daily. 02/13/20   Ezekiel Slocumb, DO  aspirin EC 81 MG tablet Take 81 mg by mouth daily.    [provider]  atorvastatin (LIPITOR) 80 MG tablet Take 80 mg by mouth daily. 08/02/21   [provider]  cetirizine (ZYRTEC) 10 MG tablet Take 10 mg by mouth at bedtime.    [provider]  diphenhydrAMINE (BENADRYL) 25 MG tablet Take 50 mg by mouth at bedtime.    [provider]  Dulaglutide (TRULICITY) 1.5 QI/2.9NL SOPN Inject into the skin once a week.    [provider]  EPINEPHrine 0.3 mg/0.3 mL IJ SOAJ injection Inject into the muscle as directed. 05/11/21   [provider]  ezetimibe (ZETIA) 10 MG tablet Take 1 tablet (10 mg total) by mouth daily. 12/03/18   Arnetha Courser, MD  famotidine (PEPCID) 10 MG tablet Take 10 mg by mouth daily.    [provider]  hydrALAZINE (APRESOLINE) 25 MG tablet Take 1 tablet (25 mg total) by mouth 2 (two) times daily. 08/25/18 09/12/21  Saundra Shelling, MD  indomethacin (INDOCIN) 25 MG capsule Take 1 capsule (25 mg total) by mouth 3 (three) times daily as needed. 03/18/21   Verda Cumins, MD  Magnesium 500 MG CAPS Take by mouth.    [provider]  metoprolol tartrate (LOPRESSOR) 25 MG tablet Take 2 tablets (50 mg total) by mouth 2 (two) times daily. 11/01/20 09/12/21  Dessa Phi, DO  nitroGLYCERIN (NITROSTAT) 0.4 MG SL tablet Place 1 tablet (0.4 mg total) under the tongue every 5 (five) minutes as needed for chest pain. 08/25/18   Saundra Shelling, MD  OneTouch Delica Lancets 67R MISC Apply 1 each topically 2 (two) times daily. 03/02/21   [provider]  Donald Siva test strip SMARTSIG:Via Meter 08/19/20   [provider]  pregabalin (LYRICA) 50 MG capsule 1 capsule in the morning and 2 capsules at night. 10/06/21   Gillis Santa, MD  RYBELSUS 3 MG TABS Take 3 mg by mouth daily at 6 (six) AM.     [provider]  tiZANidine (ZANAFLEX) 4 MG tablet Take 1 tablet (4 mg total) by mouth every 8 (eight) hours as needed for muscle spasms. 10/06/21 04/04/22  Gillis Santa, MD  XIGDUO XR 04-999 MG TB24 Take 1 tablet by mouth 2 (two) times daily. 06/30/21   [provider]  metFORMIN (GLUCOPHAGE) 1000 MG tablet Take 1 tablet (1,000 mg total) by mouth 2 (two) times daily with a meal. 11/01/20 12/16/20  Dessa Phi, DO     Allergies Imdur [isosorbide nitrate], Lisinopril, Other, Drug [tape], Tapentadol, and Ace inhibitors   Family History  Problem Relation Age of Onset   Breast cancer Mother    Skin cancer Mother    Arrhythmia Mother        A-fib   Cancer Mother        breast, colon?, skin   Hypertension Father    Diabetes Father    Cancer Maternal Grandfather    Heart disease Brother        stent   Allergies Son    Alzheimer's disease Maternal Grandmother    Stroke Neg Hx    COPD Neg Hx     Social History Social History   Tobacco Use   Smoking status: Former    Packs/day: 1.00    Years: 10.00    Pack years: 10.00    Types: Cigarettes    Quit date: 04/15/2003    Years since quitting: 18.5   Smokeless tobacco: Never  Vaping Use   Vaping Use: Never used  Substance Use Topics   Alcohol use: No    Alcohol/week: 0.0 standard drinks   Drug use: No    Review of Systems  Constitutional:   No fever or chills.  ENT:   No sore throat. No rhinorrhea. Cardiovascular: Positive chest pain as above without syncope. Respiratory:   No dyspnea positive nonproductive cough. Gastrointestinal:   Negative for abdominal pain, vomiting and diarrhea.  Musculoskeletal:   Negative for focal pain or swelling All other systems reviewed and are negative except as documented above in ROS and HPI.  ____________________________________________   PHYSICAL EXAM:  VITAL SIGNS: ED Triage Vitals  Enc Vitals Group     BP 11/03/21 1609 (!) 158/112     Pulse Rate 11/03/21 1609 (!) 123      Resp 11/03/21 1609 17     Temp 11/03/21 1609 99.1 F (37.3 C)     Temp Source 11/03/21 1609 Oral     SpO2 11/03/21 1609 96 %     Weight 11/03/21 1606 205 lb (93 kg)     Height 11/03/21 1606 5\' 11"  (1.803 m)     Head Circumference --      Peak Flow --  Pain Score 11/03/21 1606 10     Pain Loc --      Pain Edu? --      Excl. in [place]? --     Vital signs reviewed, nursing assessments reviewed.   Constitutional:   Alert and oriented.  Very uncomfortable appearing. Eyes:   Conjunctivae are normal. EOMI. PERRL. ENT      Head:   Normocephalic and atraumatic.      Nose:   Normal.      Mouth/Throat:   Normal, moist mucosa      Neck:   No meningismus. Full ROM. Hematological/Lymphatic/Immunilogical:   No cervical lymphadenopathy. Cardiovascular:   Tachycardia heart rate 120. Symmetric bilateral radial and DP pulses.  No murmurs. Cap refill less than 2 seconds. Respiratory:   Normal respiratory effort without tachypnea/retractions. Breath sounds are clear and equal bilaterally. No wheezes/rales/rhonchi. Gastrointestinal:   Soft and nontender. Non distended. There is no CVA tenderness.  No rebound, rigidity, or guarding. Genitourinary:   deferred Musculoskeletal:   Normal range of motion in all extremities. No joint effusions.  No lower extremity tenderness.  No edema. Neurologic:   Normal speech and language.  Motor grossly intact. No acute focal neurologic deficits are appreciated.  Skin:    Skin is warm, dry and intact. No rash noted.  No petechiae, purpura, or bullae.  ____________________________________________    LABS (pertinent positives/negatives) (all labs ordered are listed, but only abnormal results are displayed) Labs Reviewed  RESP PANEL BY RT-PCR (FLU A&B, COVID) ARPGX2 - Abnormal; Notable for the following components:      Result Value   Influenza A by PCR POSITIVE (*)    All other components within normal limits  BASIC METABOLIC PANEL - Abnormal; Notable for the  following components:   CO2 20 (*)    Glucose, Bld 122 (*)    All other components within normal limits  CBC - Abnormal; Notable for the following components:   RBC 6.13 (*)    All other components within normal limits  HEPATIC FUNCTION PANEL - Abnormal; Notable for the following components:   AST 125 (*)    ALT 136 (*)    Total Bilirubin 1.9 (*)    Bilirubin, Direct 0.3 (*)    Indirect Bilirubin 1.6 (*)    All other components within normal limits  LIPASE, BLOOD - Abnormal; Notable for the following components:   Lipase 89 (*)    All other components within normal limits  MAGNESIUM  TROPONIN I (HIGH SENSITIVITY)  TROPONIN I (HIGH SENSITIVITY)   ____________________________________________   EKG  Interpreted by me Sinus tachycardia rate 118.  Normal axis.  Slightly prolonged QTC of 515 ms.  Normal QRS ST segments and T waves.  No acute ischemic changes.  ____________________________________________    RADIOLOGY  CT Angio Chest/Abd/Pel for Dissection W and/or Wo Contrast  Result Date: 11/03/2021 CLINICAL DATA:  Chest pain or back pain, aortic dissection suspected. Pt has left side chest pain for 2 hours with nausea. Pt took 2 ntg without relief. No sob. Pt alert speech clear. EXAM: CT ANGIOGRAPHY CHEST, ABDOMEN AND PELVIS TECHNIQUE: Non-contrast CT of the chest was initially obtained. Multidetector CT imaging through the chest, abdomen and pelvis was performed using the standard protocol during bolus administration of intravenous contrast. Multiplanar reconstructed images and MIPs were obtained and reviewed to evaluate the vascular anatomy. CONTRAST:  146mL OMNIPAQUE IOHEXOL 350 MG/ML SOLN COMPARISON:  Chest x-ray 09/13/2021, CT angio chest 10/29/2020 FINDINGS: CTA CHEST FINDINGS Cardiovascular: Preferential  opacification of the thoracic aorta. No evidence of thoracic aortic aneurysm or dissection. Normal heart size. No significant pericardial effusion. The thoracic aorta is normal  in caliber. Mild atherosclerotic plaque of the thoracic aorta. Four-vessel coronary artery calcifications. Mediastinum/Nodes: No enlarged mediastinal, hilar, or axillary lymph nodes. Thyroid gland, trachea, and esophagus demonstrate no significant findings. Lungs/Pleura: No focal consolidation. Couple scattered pulmonary micronodules that are calcified and noncalcified. No pulmonary mass. No pleural effusion. No pneumothorax. Musculoskeletal: No chest wall abnormality. No suspicious lytic or blastic osseous lesions. No acute displaced fracture. Multilevel degenerative changes of the spine. Review of the MIP images confirms the above findings. CTA ABDOMEN AND PELVIS FINDINGS VASCULAR Aorta: Severe atherosclerotic plaque. Normal caliber aorta without aneurysm, dissection, vasculitis or significant stenosis. Celiac: Mild atherosclerotic plaque. Patent without evidence of aneurysm, dissection, vasculitis or significant stenosis. SMA: Patent without evidence of aneurysm, dissection, vasculitis or significant stenosis. Renals: Mild atherosclerotic plaque. Both renal arteries are patent without evidence of aneurysm, dissection, vasculitis, fibromuscular dysplasia or significant stenosis. IMA: Patent without evidence of aneurysm, dissection, vasculitis or significant stenosis. Inflow: Patent without evidence of aneurysm, dissection, vasculitis or significant stenosis. Veins: No obvious venous abnormality within the limitations of this arterial phase study. Review of the MIP images confirms the above findings. NON-VASCULAR Hepatobiliary: No focal liver abnormality. No gallstones, gallbladder wall thickening, or pericholecystic fluid. No biliary dilatation. Pancreas: No focal lesion. Normal pancreatic contour. No surrounding inflammatory changes. No main pancreatic ductal dilatation. Spleen: Normal in size without focal abnormality. Adrenals/Urinary Tract: No adrenal nodule bilaterally. Bilateral kidneys enhance  symmetrically. No hydronephrosis. No hydroureter. The urinary bladder is unremarkable. Stomach/Bowel: Stomach is within normal limits. No evidence of bowel wall thickening or dilatation. Stool throughout the colon. Appendix is normal in caliber with no associated inflammatory changes. Stool is noted within the appendiceal lumen. Lymphatic: No lymphadenopathy. Reproductive: Prominent prostate. Other: No intraperitoneal free fluid. No intraperitoneal free gas. No organized fluid collection. Musculoskeletal: No abdominal wall hernia or abnormality. No suspicious lytic or blastic osseous lesions. No acute displaced fracture. Multilevel severe degenerative changes of the spine. Review of the MIP images confirms the above findings. IMPRESSION: 1. No acute vascular abnormality. 2. Aortic Atherosclerosis (ICD10-I70.0) including four-vessel coronary artery calcifications. 3. Constipation. 4. Severe multilevel degenerative changes of the lumbar spine. Electronically Signed   By: Iven Finn M.D.   On: 11/03/2021 17:05    ____________________________________________   PROCEDURES .Critical Care Performed by: Carrie Mew, MD Authorized by: Carrie Mew, MD   Critical care provider statement:    Critical care time (minutes):  35   Critical care time was exclusive of:  Separately billable procedures and treating other patients   Critical care was necessary to treat or prevent imminent or life-threatening deterioration of the following conditions:  Cardiac failure and circulatory failure   Critical care was time spent personally by me on the following activities:  Development of treatment plan with patient or surrogate, discussions with consultants, evaluation of patient's response to treatment, examination of patient, obtaining history from patient or surrogate, ordering and performing treatments and interventions, ordering and review of laboratory studies, ordering and review of radiographic studies,  pulse oximetry, re-evaluation of patient's condition and review of old charts  ____________________________________________  DIFFERENTIAL DIAGNOSIS   Aortic dissection, non-STEMI, myocarditis, renal colic, GERD/esophageal spasm  CLINICAL IMPRESSION / ASSESSMENT AND PLAN / ED COURSE  Medications ordered in the ED: Medications  nitroGLYCERIN (NITROSTAT) SL tablet 0.4 mg (0.4 mg Sublingual Given 11/03/21 1655)  nitroGLYCERIN 50 mg  in dextrose 5 % 250 mL (0.2 mg/mL) infusion (50 mcg/min Intravenous Rate/Dose Change 11/03/21 1830)  metoprolol tartrate (LOPRESSOR) injection 5 mg (5 mg Intravenous Given 11/03/21 1631)  morphine 4 MG/ML injection 4 mg (4 mg Intravenous Given 11/03/21 1632)  ondansetron (ZOFRAN) injection 4 mg (4 mg Intravenous Given 11/03/21 1632)  sodium chloride 0.9 % bolus 1,000 mL (0 mLs Intravenous Stopped 11/03/21 1753)  iohexol (OMNIPAQUE) 350 MG/ML injection 100 mL (100 mLs Intravenous Contrast Given 11/03/21 1647)  pantoprazole (PROTONIX) injection 40 mg (40 mg Intravenous Given 11/03/21 1733)    Pertinent labs & imaging results that were available during my care of the patient were reviewed by me and considered in my medical decision making (see chart for details).  JAHFARI AMBERS was evaluated in Emergency Department on 11/03/2021 for the symptoms described in the history of present illness. He was evaluated in the context of the global COVID-19 pandemic, which necessitated consideration that the patient might be at risk for infection with the SARS-CoV-2 virus that causes COVID-19. Institutional protocols and algorithms that pertain to the evaluation of patients at risk for COVID-19 are in a state of rapid change based on information released by regulatory bodies including the CDC and federal and state organizations. These policies and algorithms were followed during the patient's care in the ED.   Patient presents with abrupt onset radiating chest pain which is severe and waxing  waning.  No shortness of breath.  High suspicion for aortic dissection versus ACS, less likely PE.  Will obtain CT angiogram of the chest, labs.  Will give a dose of IV metoprolol now for his tachycardia along with additional nitroglycerin, IV fluids, morphine 4 mg IV for pain relief.  Patient will require hospitalization for further cardiac work-up even if initial tests are reassuring.  Clinical Course as of 11/03/21 1847  Wed Nov 03, 2021  1700 CT images viewed by me, appears negative for dissection, large ptx, or central PE. [PS]  [facility] report for CT scan reviewed, unremarkable.  Serial EKGs unchanged, no evolving ischemic changes.  Patient has persistent severe pain.  We will start nitroglycerin infusion. [PS]  4782 Influenza A By PCR(!): POSITIVE Patient is positive for influenza A.  This does not sufficiently explain his symptoms due to the focal chest pain and lack of chills, body aches, or other constitutional symptoms. Will still need to hospitalize for cardiac eval. [PS]  9562 Discussed with cardiology Dr. Saralyn Pilar who notes that patient has had similar presentations in the past without identifiable cardiac cause.  Recommends withholding heparin unless troponin becomes elevated. [PS]    Clinical Course User Index [PS] Carrie Mew, MD     ____________________________________________   FINAL CLINICAL IMPRESSION(S) / ED DIAGNOSES    Final diagnoses:  Chest pain with moderate risk for cardiac etiology  Influenza A  Type 2 diabetes mellitus without complication, without long-term current use of insulin [facility])     ED Discharge Orders     None       Portions of this note were generated with dragon dictation software. Dictation errors may occur despite best attempts at proofreading.    Carrie Mew, MD 11/03/21 [phone]

## 2021-11-03 NOTE — ED Notes (Signed)
This nurse accompanying pt to CT.

## 2021-11-03 NOTE — ED Notes (Signed)
Chaplain personnel requested and at bedside for this pt

## 2021-11-03 NOTE — ED Notes (Signed)
Pt to room 9

## 2021-11-03 NOTE — ED Notes (Signed)
Pt shaking. Will check CBG.

## 2021-11-03 NOTE — ED Notes (Signed)
Contacted Dr Joni Fears re: whether to start nitro drip at this point, with current HR 106 and BP 136/98.

## 2021-11-03 NOTE — ED Notes (Signed)
CT called to come get pt for CTA and dissection study. Labs not back. EDP okay with pt going now for CT without labs.

## 2021-11-03 NOTE — ED Triage Notes (Signed)
Pt has left side chest pain for 2 hours with nausea.  Pt took 2 ntg without relief.  No sob.  Pt alert  speech clear.

## 2021-11-03 NOTE — ED Notes (Signed)
Dr Joni Fears at bedside discussing plan of care and findings so far.

## 2021-11-03 NOTE — ED Notes (Signed)
Back in room from CT. Third nitro given in CT.

## 2021-11-03 NOTE — ED Notes (Signed)
Called Sarah,RN for reports. Requested to send secure chat

## 2021-11-03 NOTE — ED Notes (Signed)
Pt just had another wave of severe L chest pain, sharp, radiating to back. Now pain is less.

## 2021-11-03 NOTE — Progress Notes (Signed)
Chaplain Maggie made initial visit at bedside with pt and his wife. Storytelling, listening, and prayer were central to this visit. The family's faith in the [employer] [place] brings them comfort and peace during anxious times. Both expressed appreciation for spiritual support during this moment. Continued care available per on call chaplain as needed.

## 2021-11-03 NOTE — ED Notes (Signed)
Secure chat sent to attending physician Dr Nevada Crane and Dr Tobie Poet, made aware pt not accepted to go to cardiac-tele unit d/t NTG drip increase in rate >18mck/kg/min befor pain med administration for pt's c/o CP . CN Caryl Pina made aware of bed placement situation

## 2021-11-03 NOTE — ED Notes (Signed)
Dr. Hall at bedside.

## 2021-11-04 ENCOUNTER — Encounter: Payer: Self-pay | Admitting: Cardiology

## 2021-11-04 ENCOUNTER — Encounter
Admission: EM | Disposition: A | Discharge status: Home / Self Care | Payer: Self-pay | Source: Home / Self Care | Attending: Student

## 2021-11-04 DIAGNOSIS — I214 Non-ST elevation (NSTEMI) myocardial infarction: Secondary | ICD-10-CM

## 2021-11-04 HISTORY — PX: LEFT HEART CATH: CATH118248

## 2021-11-04 HISTORY — PX: CORONARY ANGIOGRAPHY: CATH118303

## 2021-11-04 LAB — COMPREHENSIVE METABOLIC PANEL
ALT: 125 U/L — ABNORMAL HIGH (ref 0–44)
AST: 97 U/L — ABNORMAL HIGH (ref 15–41)
Albumin: 4.6 g/dL (ref 3.5–5.0)
Alkaline Phosphatase: 75 U/L (ref 38–126)
Anion gap: 10 (ref 5–15)
BUN: 14 mg/dL (ref 6–20)
CO2: 21 mmol/L — ABNORMAL LOW (ref 22–32)
Calcium: 8.5 mg/dL — ABNORMAL LOW (ref 8.9–10.3)
Chloride: 101 mmol/L (ref 98–111)
Creatinine, Ser: 0.98 mg/dL (ref 0.61–1.24)
GFR, Estimated: >60 mL/min (ref >60)
Glucose, Bld: 141 mg/dL — ABNORMAL HIGH (ref 70–99)
Potassium: 4 mmol/L (ref 3.5–5.1)
Sodium: 132 mmol/L — ABNORMAL LOW (ref 135–145)
Total Bilirubin: 2.3 mg/dL — ABNORMAL HIGH (ref 0.3–1.2)
Total Protein: 7.6 g/dL (ref 6.5–8.1)

## 2021-11-04 LAB — CBC
HCT: 45.4 % (ref 39.0–52.0)
Hemoglobin: 14.7 g/dL (ref 13.0–17.0)
MCH: 26.8 pg (ref 26.0–34.0)
MCHC: 32.4 g/dL (ref 30.0–36.0)
MCV: 82.7 fL (ref 80.0–100.0)
Platelets: 174 10*3/uL (ref 150–400)
RBC: 5.49 MIL/uL (ref 4.22–5.81)
RDW: 14.4 % (ref 11.5–15.5)
WBC: 6.6 10*3/uL (ref 4.0–10.5)
nRBC: 0 % (ref 0.0–0.2)

## 2021-11-04 LAB — TSH: TSH: 0.662 u[IU]/mL (ref 0.350–4.500)

## 2021-11-04 LAB — GLUCOSE, CAPILLARY
Glucose-Capillary: 126 mg/dL — ABNORMAL HIGH (ref 70–99)
Glucose-Capillary: 150 mg/dL — ABNORMAL HIGH (ref 70–99)

## 2021-11-04 LAB — T4, FREE: Free T4: 0.92 ng/dL (ref 0.61–1.12)

## 2021-11-04 LAB — MRSA NEXT GEN BY PCR, NASAL: MRSA by PCR Next Gen: NOT DETECTED

## 2021-11-04 LAB — HIV ANTIBODY (ROUTINE TESTING W REFLEX): HIV Screen 4th Generation wRfx: NONREACTIVE

## 2021-11-04 LAB — PHOSPHORUS: Phosphorus: 4.9 mg/dL — ABNORMAL HIGH (ref 2.5–4.6)

## 2021-11-04 LAB — MAGNESIUM: Magnesium: 2 mg/dL (ref 1.7–2.4)

## 2021-11-04 LAB — HEPARIN LEVEL (UNFRACTIONATED): Heparin Unfractionated: 0.79 IU/mL — ABNORMAL HIGH (ref 0.30–0.70)

## 2021-11-04 SURGERY — LEFT HEART CATH
Anesthesia: Moderate Sedation

## 2021-11-04 MED ORDER — BISACODYL 5 MG PO TBEC
10.0000 mg | DELAYED_RELEASE_TABLET | Freq: Every day | ORAL | Status: DC | PRN
  Filled 2021-11-04: qty 2

## 2021-11-04 MED ORDER — LABETALOL HCL 5 MG/ML IV SOLN: 10.0000 mg | INTRAVENOUS | Status: AC | PRN

## 2021-11-04 MED ORDER — GUAIFENESIN-DM 100-10 MG/5ML PO SYRP
10.0000 mL | ORAL_SOLUTION | Freq: Four times a day (QID) | ORAL | Status: DC | PRN
  Administered 2021-11-04: 10 mL via ORAL
  Filled 2021-11-04: qty 10

## 2021-11-04 MED ORDER — VERAPAMIL HCL 2.5 MG/ML IV SOLN
INTRAVENOUS | Status: DC | PRN
  Administered 2021-11-04: 2.5 mg via INTRA_ARTERIAL

## 2021-11-04 MED ORDER — ASPIRIN 81 MG PO CHEW
CHEWABLE_TABLET | ORAL | Status: AC
  Administered 2021-11-04: 81 mg via ORAL
  Filled 2021-11-04: qty 1

## 2021-11-04 MED ORDER — POLYETHYLENE GLYCOL 3350 17 G PO PACK
17.0000 g | PACK | Freq: Every day | ORAL | Status: DC
  Filled 2021-11-04: qty 1

## 2021-11-04 MED ORDER — SODIUM CHLORIDE 0.9 % WEIGHT BASED INFUSION
3.0000 mL/kg/h | INTRAVENOUS | Status: AC
  Administered 2021-11-04: 3 mL/kg/h via INTRAVENOUS

## 2021-11-04 MED ORDER — FENTANYL CITRATE (PF) 100 MCG/2ML IJ SOLN
INTRAMUSCULAR | Status: DC | PRN
  Administered 2021-11-04: 50 ug via INTRAVENOUS

## 2021-11-04 MED ORDER — METOPROLOL TARTRATE 5 MG/5ML IV SOLN
5.0000 mg | INTRAVENOUS | Status: DC | PRN
  Administered 2021-11-04: 5 mg via INTRAVENOUS
  Filled 2021-11-04: qty 5

## 2021-11-04 MED ORDER — LIDOCAINE HCL (PF) 1 % IJ SOLN
INTRAMUSCULAR | Status: DC | PRN
  Administered 2021-11-04: 2 mL

## 2021-11-04 MED ORDER — SODIUM CHLORIDE 0.9% FLUSH: 3.0000 mL | INTRAVENOUS | Status: DC | PRN

## 2021-11-04 MED ORDER — IOHEXOL 350 MG/ML SOLN
INTRAVENOUS | Status: DC | PRN
  Administered 2021-11-04: 70 mL

## 2021-11-04 MED ORDER — LIDOCAINE HCL 1 % IJ SOLN
INTRAMUSCULAR | Status: AC
  Filled 2021-11-04: qty 20

## 2021-11-04 MED ORDER — ACETAMINOPHEN 325 MG PO TABS: 650.0000 mg | ORAL_TABLET | ORAL | Status: DC | PRN

## 2021-11-04 MED ORDER — ENOXAPARIN SODIUM 40 MG/0.4ML IJ SOSY
40.0000 mg | PREFILLED_SYRINGE | INTRAMUSCULAR | Status: DC
  Administered 2021-11-04: 40 mg via SUBCUTANEOUS
  Filled 2021-11-04: qty 0.4

## 2021-11-04 MED ORDER — SODIUM CHLORIDE 0.9 % WEIGHT BASED INFUSION
1.0000 mL/kg/h | INTRAVENOUS | Status: AC
  Administered 2021-11-04: 1 mL/kg/h via INTRAVENOUS

## 2021-11-04 MED ORDER — SODIUM CHLORIDE 0.9% FLUSH
3.0000 mL | Freq: Two times a day (BID) | INTRAVENOUS | Status: DC
  Administered 2021-11-04 – 2021-11-05 (×2): 3 mL via INTRAVENOUS

## 2021-11-04 MED ORDER — SODIUM CHLORIDE 0.9 % IV SOLN: 250.0000 mL | INTRAVENOUS | Status: DC | PRN

## 2021-11-04 MED ORDER — SODIUM CHLORIDE 0.9 % WEIGHT BASED INFUSION: 1.0000 mL/kg/h | INTRAVENOUS | Status: DC

## 2021-11-04 MED ORDER — BISACODYL 10 MG RE SUPP
10.0000 mg | Freq: Every day | RECTAL | Status: DC | PRN
  Filled 2021-11-04: qty 1

## 2021-11-04 MED ORDER — ONDANSETRON HCL 4 MG/2ML IJ SOLN: 4.0000 mg | Freq: Four times a day (QID) | INTRAMUSCULAR | Status: DC | PRN

## 2021-11-04 MED ORDER — HEPARIN SODIUM (PORCINE) 1000 UNIT/ML IJ SOLN
INTRAMUSCULAR | Status: AC
  Filled 2021-11-04: qty 1

## 2021-11-04 MED ORDER — MIDAZOLAM HCL 2 MG/2ML IJ SOLN
INTRAMUSCULAR | Status: DC | PRN
  Administered 2021-11-04: 1 mg via INTRAVENOUS

## 2021-11-04 MED ORDER — BISACODYL 5 MG PO TBEC: 10.0000 mg | DELAYED_RELEASE_TABLET | Freq: Once | ORAL | Status: DC

## 2021-11-04 MED ORDER — VERAPAMIL HCL 2.5 MG/ML IV SOLN
INTRAVENOUS | Status: AC
  Filled 2021-11-04: qty 2

## 2021-11-04 MED ORDER — CHLORHEXIDINE GLUCONATE CLOTH 2 % EX PADS
6.0000 | MEDICATED_PAD | Freq: Every day | CUTANEOUS | Status: DC
  Administered 2021-11-04: 6 via TOPICAL

## 2021-11-04 MED ORDER — HEPARIN (PORCINE) IN NACL 2000-0.9 UNIT/L-% IV SOLN
INTRAVENOUS | Status: DC | PRN
  Administered 2021-11-04: 1000 mL

## 2021-11-04 MED ORDER — HEPARIN SODIUM (PORCINE) 1000 UNIT/ML IJ SOLN
INTRAMUSCULAR | Status: DC | PRN
  Administered 2021-11-04: 4500 [IU] via INTRAVENOUS

## 2021-11-04 MED ORDER — MIDAZOLAM HCL 2 MG/2ML IJ SOLN
INTRAMUSCULAR | Status: AC
  Filled 2021-11-04: qty 2

## 2021-11-04 MED ORDER — FENTANYL CITRATE (PF) 100 MCG/2ML IJ SOLN
INTRAMUSCULAR | Status: AC
  Filled 2021-11-04: qty 2

## 2021-11-04 MED ORDER — ASPIRIN 81 MG PO CHEW: 81.0000 mg | CHEWABLE_TABLET | ORAL | Status: AC

## 2021-11-04 MED ORDER — HEPARIN (PORCINE) IN NACL 1000-0.9 UT/500ML-% IV SOLN
INTRAVENOUS | Status: AC
  Filled 2021-11-04: qty 1000

## 2021-11-04 MED ORDER — HYDRALAZINE HCL 20 MG/ML IJ SOLN: 10.0000 mg | INTRAMUSCULAR | Status: AC | PRN

## 2021-11-04 SURGICAL SUPPLY — 12 items
CATH INFINITI 5 FR JL3.5 (CATHETERS) ×2 IMPLANT
CATH INFINITI 5FR ANG PIGTAIL (CATHETERS) ×2 IMPLANT
CATH INFINITI JR4 5F (CATHETERS) ×2 IMPLANT
DEVICE RAD TR BAND REGULAR (VASCULAR PRODUCTS) ×2 IMPLANT
DRAPE BRACHIAL (DRAPES) ×2 IMPLANT
GLIDESHEATH SLEND SS 6F .021 (SHEATH) ×2 IMPLANT
GUIDEWIRE INQWIRE 1.5J.035X260 (WIRE) ×1 IMPLANT
INQWIRE 1.5J .035X260CM (WIRE) ×2
PACK CARDIAC CATH (CUSTOM PROCEDURE TRAY) ×2 IMPLANT
PROTECTION STATION PRESSURIZED (MISCELLANEOUS) ×2
SET ATX SIMPLICITY (MISCELLANEOUS) ×2 IMPLANT
STATION PROTECTION PRESSURIZED (MISCELLANEOUS) ×1 IMPLANT

## 2021-11-04 NOTE — ED Notes (Signed)
Secure chat sent to Dr. Tobie Poet updating on pt's status , noted pt persistent tachy with HR noted 120's-140's .

## 2021-11-04 NOTE — Consult Note (Signed)
[facility] Cardiology  CARDIOLOGY CONSULT NOTE  Patient ID: EUGEAN ARNOTT MRN: [id number] DOB/AGE: 48-Feb-1972 48 y.o.  Admit date: 11/03/2021 Referring Physician Dwyane Dee Primary Physician Juanda Bond NP Primary Cardiologist Naomie Crow Reason for Consultation chest pain/unstable angina  HPI: 50 year old gentleman referred for evaluation of chest pain/unstable angina.  The patient has known coronary artery disease, history of inferior STEMI and primary PCI with DES left circumflex 08/23/2018.  Patient underwent repeat cardiac catheterization for apparent non-STEMI 12/09/2020 which revealed patent stent.  He was in his usual state of health until yesterday when he developed severe, sharp, left-sided chest discomfort at approximately 2 PM.  Patient presented to [facility] ED at 4 PM still experiencing chest pain.  The patient has ruled out for cardial infarction with normal high sensitive troponin (8, 8, 7).  ECG revealed sinus tachycardia with old inferior MI.  The patient has continued to experience chest discomfort, with nitroglycerin drip and eventually placed on heparin infusion with resolution of chest pain at approximately 10 PM.  Review of systems complete and found to be negative unless listed above     Past Medical History:  Diagnosis Date   Angioedema    Bronchitis    DDD (degenerative disc disease), cervical    Diabetes mellitus ([place])    Fatty liver 03/31/2017   [place] April 2018   GERD (gastroesophageal reflux disease)    Gout    Heart attack ([place])    HTN (hypertension)    Hypertension    CONTROLLED ON MEDS   IBS (irritable bowel syndrome)    Kidney stone    LVH (left ventricular hypertrophy) 11/24/2018   Moderate, ECHO   Myocardial infarction ([place]) 08/23/2018   Seasonal allergies    Spinal stenosis     Past Surgical History:  Procedure Laterality Date   ACDF with fusion  2007   CARDIAC CATHETERIZATION Left 08/15/2016   Procedure: Left Heart Cath and Coronary Angiography;  Surgeon:  Wellington Hampshire, MD;  Location: [place] [facility];  Service: Cardiovascular;  Laterality: Left;   COLONOSCOPY WITH PROPOFOL N/A 06/26/2015   Procedure: COLONOSCOPY WITH PROPOFOL;  Surgeon: Lucilla Lame, MD;  Location: [place];  Service: Endoscopy;  Laterality: N/A;  with biopsies   CORONARY/GRAFT ACUTE MI REVASCULARIZATION N/A 08/23/2018   Procedure: Coronary/Graft Acute MI Revascularization;  Surgeon: Isaias Cowman, MD;  Location: [place] [facility];  Service: Cardiovascular;  Laterality: N/A;   CYSTOSCOPY W/ RETROGRADES  02/10/2020   Procedure: CYSTOSCOPY WITH RETROGRADE PYELOGRAM;  Surgeon: Billey Co, MD;  Location: [facility];  Service: Urology;;   CYSTOSCOPY WITH BIOPSY N/A 11/22/2019   Procedure: CYSTOSCOPY WITH Bladder BIOPSY & Almyra Free;  Surgeon: Billey Co, MD;  Location: [facility];  Service: Urology;  Laterality: N/A;   CYSTOSCOPY/URETEROSCOPY/HOLMIUM LASER  02/10/2020   Procedure: CYSTOSCOPY/URETEROSCOPY/HOLMIUM LASER;  Surgeon: Billey Co, MD;  Location: [facility];  Service: Urology;;   CYSTOSCOPY/URETEROSCOPY/HOLMIUM LASER/STENT PLACEMENT Left 11/22/2019   Procedure: CYSTOSCOPY/URETEROSCOPY/BILATERAL RETROGRADE PYELOGRAM/HOLMIUM LASER/STENT PLACEMENT;  Surgeon: Billey Co, MD;  Location: [facility];  Service: Urology;  Laterality: Left;   EXTRACORPOREAL SHOCK WAVE LITHOTRIPSY Left 10/29/2020   Procedure: EXTRACORPOREAL SHOCK WAVE LITHOTRIPSY (ESWL);  Surgeon: Hollice Espy, MD;  Location: [facility];  Service: Urology;  Laterality: Left;   LEFT HEART CATH AND CORONARY ANGIOGRAPHY N/A 08/23/2018   Procedure: LEFT HEART CATH AND CORONARY ANGIOGRAPHY;  Surgeon: Isaias Cowman, MD;  Location: [place] [facility];  Service: Cardiovascular;  Laterality: N/A;   LEFT HEART CATH AND  CORONARY ANGIOGRAPHY N/A 02/10/2020   Procedure: LEFT HEART CATH AND CORONARY ANGIOGRAPHY;  Surgeon: Corey Skains, MD;  Location: [place] [facility];  Service:  Cardiovascular;  Laterality: N/A;   NASAL SEPTUM SURGERY  2004   SHOULDER ARTHROSCOPY W/ ROTATOR CUFF REPAIR  10/01/2019   SHOULDER SURGERY Left 2007   Testicular torsion  1980s   VASECTOMY      Medications Prior to Admission  Medication Sig Dispense Refill Last Dose   allopurinol (ZYLOPRIM) 100 MG tablet Take 100 mg by mouth daily.    11/03/2021 at 0700   amLODipine (NORVASC) 10 MG tablet Take 1 tablet (10 mg total) by mouth daily. 30 tablet 1 11/03/2021 at 0700   aspirin EC 81 MG tablet Take 81 mg by mouth daily.   11/03/2021 at 0700   atorvastatin (LIPITOR) 80 MG tablet Take 80 mg by mouth daily.   11/02/2021 at 2000   cetirizine (ZYRTEC) 10 MG tablet Take 10 mg by mouth at bedtime.   11/02/2021 at 2000   diphenhydrAMINE (BENADRYL) 25 MG tablet Take 50 mg by mouth at bedtime.   11/02/2021 at 2000   ezetimibe (ZETIA) 10 MG tablet Take 1 tablet (10 mg total) by mouth daily. 30 tablet 11 11/03/2021 at 0700   famotidine (PEPCID) 10 MG tablet Take 10 mg by mouth daily.   11/03/2021 at 0700   hydrALAZINE (APRESOLINE) 25 MG tablet Take 1 tablet (25 mg total) by mouth 2 (two) times daily. 60 tablet 0 11/03/2021 at 0700   metoprolol tartrate (LOPRESSOR) 25 MG tablet Take 2 tablets (50 mg total) by mouth 2 (two) times daily. 120 tablet 0 11/03/2021 at 0700   pregabalin (LYRICA) 50 MG capsule 1 capsule in the morning and 2 capsules at night. (Patient taking differently: Take 50-100 mg by mouth 2 (two) times daily. 1 capsule in the morning and 2 capsules at night.) 90 capsule 5 11/03/2021 at 0700   tiZANidine (ZANAFLEX) 4 MG tablet Take 1 tablet (4 mg total) by mouth every 8 (eight) hours as needed for muscle spasms. 90 tablet 5 11/02/2021 at 2000   XIGDUO XR 04-999 MG TB24 Take 2 tablets by mouth daily.   11/03/2021 at 0700   Dulaglutide (TRULICITY) 1.5 WU/1.3KG SOPN Inject 1.5 mg into the skin once a week.   11/01/2021   EPINEPHrine 0.3 mg/0.3 mL IJ SOAJ injection Inject into the muscle as directed.   prn at  unknown   indomethacin (INDOCIN) 25 MG capsule Take 1 capsule (25 mg total) by mouth 3 (three) times daily as needed. 30 capsule 0 prn at unknown   Magnesium 500 MG CAPS Take by mouth.      nitroGLYCERIN (NITROSTAT) 0.4 MG SL tablet Place 1 tablet (0.4 mg total) under the tongue every 5 (five) minutes as needed for chest pain. 30 tablet 0 prn at unknown   OneTouch Delica Lancets 40N MISC Apply 1 each topically 2 (two) times daily.      ONETOUCH ULTRA test strip SMARTSIG:Via Meter      RYBELSUS 3 MG TABS Take 3 mg by mouth daily at 6 (six) AM.  (Patient not taking: Reported on 11/03/2021)   Not Taking   Social History   Socioeconomic History   Marital status: Married    Spouse name: Not on file   Number of children: 1   Years of education: Not on file   Highest education level: Not on file  Occupational History   Occupation: IT    Comment:  supports the digital  software   Tobacco Use   Smoking status: Former    Packs/day: 1.00    Years: 10.00    Pack years: 10.00    Types: Cigarettes    Quit date: 04/15/2003    Years since quitting: 18.5   Smokeless tobacco: Never  Vaping Use   Vaping Use: Never used  Substance and Sexual Activity   Alcohol use: No    Alcohol/week: 0.0 standard drinks   Drug use: No   Sexual activity: Yes  Other Topics Concern   Not on file  Social History Narrative   Not on file   Social Determinants of Health   Financial Resource Strain: Not on file  Food Insecurity: Not on file  Transportation Needs: Not on file  Physical Activity: Not on file  Stress: Not on file  Social Connections: Not on file  Intimate Partner Violence: Not on file    Family History  Problem Relation Age of Onset   Breast cancer Mother    Skin cancer Mother    Arrhythmia Mother        A-fib   Cancer Mother        breast, colon?, skin   Hypertension Father    Diabetes Father    Cancer Maternal Grandfather    Heart disease Brother        stent   Allergies Son     Alzheimer's disease Maternal Grandmother    Stroke Neg Hx    COPD Neg Hx       Review of systems complete and found to be negative unless listed above      PHYSICAL EXAM  General: Well developed, well nourished, in no acute distress HEENT:  Normocephalic and atramatic Neck:  No JVD.  Lungs: Clear bilaterally to auscultation and percussion. Heart: HRRR . Normal S1 and S2 without gallops or murmurs.  Abdomen: Bowel sounds are positive, abdomen soft and non-tender  Msk:  Back normal, normal gait. Normal strength and tone for age. Extremities: No clubbing, cyanosis or edema.   Neuro: Alert and oriented X 3. Psych:  Good affect, responds appropriately  Labs:   Lab Results  Component Value Date   WBC 6.6 11/04/2021   HGB 14.7 11/04/2021   HCT 45.4 11/04/2021   MCV 82.7 11/04/2021   PLT 174 11/04/2021    Recent Labs  Lab 11/04/21 0508  NA 132*  K 4.0  CL 101  CO2 21*  BUN 14  CREATININE 0.98  CALCIUM 8.5*  PROT 7.6  BILITOT 2.3*  ALKPHOS 75  ALT 125*  AST 97*  GLUCOSE 141*   Lab Results  Component Value Date   TROPONINI <0.03 03/26/2019    Lab Results  Component Value Date   CHOL 116 10/30/2020   CHOL 145 02/08/2020   CHOL 135 01/06/2020   Lab Results  Component Value Date   HDL 42 10/30/2020   HDL 33 (L) 02/08/2020   HDL 33 (L) 01/06/2020   Lab Results  Component Value Date   LDLCALC 60 10/30/2020   LDLCALC 95 02/08/2020   LDLCALC 78 01/06/2020   Lab Results  Component Value Date   TRIG 68 10/30/2020   TRIG 84 02/08/2020   TRIG 140 01/06/2020   Lab Results  Component Value Date   CHOLHDL 2.8 10/30/2020   CHOLHDL 4.4 02/08/2020   CHOLHDL 4.1 01/06/2020   No results found for: LDLDIRECT    Radiology: CT Angio Chest/Abd/Pel for Dissection W and/or Wo Contrast  Result Date: 11/03/2021 CLINICAL DATA:  Chest pain or back pain, aortic dissection suspected. Pt has left side chest pain for 2 hours with nausea. Pt took 2 ntg without relief. No  sob. Pt alert speech clear. EXAM: CT ANGIOGRAPHY CHEST, ABDOMEN AND PELVIS TECHNIQUE: Non-contrast CT of the chest was initially obtained. Multidetector CT imaging through the chest, abdomen and pelvis was performed using the standard protocol during bolus administration of intravenous contrast. Multiplanar reconstructed images and MIPs were obtained and reviewed to evaluate the vascular anatomy. CONTRAST:  119mL OMNIPAQUE IOHEXOL 350 MG/ML SOLN COMPARISON:  Chest x-ray 09/13/2021, CT angio chest 10/29/2020 FINDINGS: CTA CHEST FINDINGS Cardiovascular: Preferential opacification of the thoracic aorta. No evidence of thoracic aortic aneurysm or dissection. Normal heart size. No significant pericardial effusion. The thoracic aorta is normal in caliber. Mild atherosclerotic plaque of the thoracic aorta. Four-vessel coronary artery calcifications. Mediastinum/Nodes: No enlarged mediastinal, hilar, or axillary lymph nodes. Thyroid gland, trachea, and esophagus demonstrate no significant findings. Lungs/Pleura: No focal consolidation. Couple scattered pulmonary micronodules that are calcified and noncalcified. No pulmonary mass. No pleural effusion. No pneumothorax. Musculoskeletal: No chest wall abnormality. No suspicious lytic or blastic osseous lesions. No acute displaced fracture. Multilevel degenerative changes of the spine. Review of the MIP images confirms the above findings. CTA ABDOMEN AND PELVIS FINDINGS VASCULAR Aorta: Severe atherosclerotic plaque. Normal caliber aorta without aneurysm, dissection, vasculitis or significant stenosis. Celiac: Mild atherosclerotic plaque. Patent without evidence of aneurysm, dissection, vasculitis or significant stenosis. SMA: Patent without evidence of aneurysm, dissection, vasculitis or significant stenosis. Renals: Mild atherosclerotic plaque. Both renal arteries are patent without evidence of aneurysm, dissection, vasculitis, fibromuscular dysplasia or significant stenosis.  IMA: Patent without evidence of aneurysm, dissection, vasculitis or significant stenosis. Inflow: Patent without evidence of aneurysm, dissection, vasculitis or significant stenosis. Veins: No obvious venous abnormality within the limitations of this arterial phase study. Review of the MIP images confirms the above findings. NON-VASCULAR Hepatobiliary: No focal liver abnormality. No gallstones, gallbladder wall thickening, or pericholecystic fluid. No biliary dilatation. Pancreas: No focal lesion. Normal pancreatic contour. No surrounding inflammatory changes. No main pancreatic ductal dilatation. Spleen: Normal in size without focal abnormality. Adrenals/Urinary Tract: No adrenal nodule bilaterally. Bilateral kidneys enhance symmetrically. No hydronephrosis. No hydroureter. The urinary bladder is unremarkable. Stomach/Bowel: Stomach is within normal limits. No evidence of bowel wall thickening or dilatation. Stool throughout the colon. Appendix is normal in caliber with no associated inflammatory changes. Stool is noted within the appendiceal lumen. Lymphatic: No lymphadenopathy. Reproductive: Prominent prostate. Other: No intraperitoneal free fluid. No intraperitoneal free gas. No organized fluid collection. Musculoskeletal: No abdominal wall hernia or abnormality. No suspicious lytic or blastic osseous lesions. No acute displaced fracture. Multilevel severe degenerative changes of the spine. Review of the MIP images confirms the above findings. IMPRESSION: 1. No acute vascular abnormality. 2. Aortic Atherosclerosis (ICD10-I70.0) including four-vessel coronary artery calcifications. 3. Constipation. 4. Severe multilevel degenerative changes of the lumbar spine. Electronically Signed   By: Iven Finn M.D.   On: 11/03/2021 17:05   US Abdomen Limited RUQ (LIVER/GB)  Result Date: 11/03/2021 CLINICAL DATA:  Transaminitis EXAM: ULTRASOUND ABDOMEN LIMITED RIGHT UPPER QUADRANT COMPARISON:  None. FINDINGS:  Gallbladder: No gallstones or wall thickening visualized. No sonographic Murphy sign noted by sonographer. Common bile duct: Diameter: 2 mm Liver: Hyperechoic hepatic parenchyma, reflecting hepatic steatosis, with focal fatty sparing along the gallbladder fossa. Portal vein is patent on color Doppler imaging with normal direction of blood flow towards the liver. Other: None. IMPRESSION: Hepatic  steatosis with focal fatty sparing. Electronically Signed   By: Julian Hy M.D.   On: 11/03/2021 23:01    EKG: Sinus tachycardia with old inferior MI  ASSESSMENT AND PLAN:   1.  Chest pain, ongoing despite nitroglycerin drip and heparin infusion, normal high-sensitivity troponin (8, 8, 7) 2.  Coronary artery disease, strip inferior STEMI with DES left circumflex 08/23/2018, with patent stent by repeat cardiac catheterization 12/09/2020 3.  Essential hypertension, initially elevated, on amlodipine, metoprolol succinate, and hydralazine 4.  Hyperlipidemia, on atorvastatin  Recommendations  1.  Agree with current therapy 2.  Continue heparin drip 3.  Continue nitroglycerin drip 4.  Proceed with cardiac catheterization.  The risk, benefits alternatives of cardiac catheterization and coronary angiography, and possible PCI were explained to the patient and informed written consent was obtained.  Signed: Isaias Cowman MD,PhD, [facility] 11/04/2021, 7:49 AM

## 2021-11-04 NOTE — Progress Notes (Signed)
ANTICOAGULATION CONSULT NOTE - Initial Consult  Pharmacy Consult for Heparin drip Indication: chest pain/ACS  Allergies  Allergen Reactions   Imdur [Isosorbide Nitrate]    Lisinopril Swelling   Other Hives    adhesives   Drug [Tape] Itching    Rash, blisters   Tapentadol Itching    Rash, blisters   Ace Inhibitors Swelling    Patient Measurements: Height: 5\' 11"  (180.3 cm) Weight: 93 kg (205 lb) IBW/kg (Calculated) : 75.3 Heparin Dosing Weight: 93 kg  Vital Signs: BP: 127/98 (11/10 0430) Pulse Rate: 126 (11/10 0430)  Labs: Recent Labs    11/03/21 1609 11/03/21 1739 11/03/21 1930 11/03/21 2118 11/04/21 0508  HGB 16.6  --   --   --  14.7  HCT 50.4  --   --   --  45.4  PLT 218  --   --   --  174  APTT  --   --   --  32  --   LABPROT  --   --   --  14.7  --   INR  --   --   --  1.2  --   HEPARINUNFRC  --   --   --   --  0.79*  CREATININE 0.93  --   --   --  0.98  TROPONINIHS 9 8 8 7   --      Estimated Creatinine Clearance: 107.4 mL/min (by C-G formula based on SCr of 0.98 mg/dL).   Medical History: Past Medical History:  Diagnosis Date   Angioedema    Bronchitis    DDD (degenerative disc disease), cervical    Diabetes mellitus ([place])    Fatty liver 03/31/2017   [place] April 2018   GERD (gastroesophageal reflux disease)    Gout    Heart attack ([place])    HTN (hypertension)    Hypertension    CONTROLLED ON MEDS   IBS (irritable bowel syndrome)    Kidney stone    LVH (left ventricular hypertrophy) 11/24/2018   Moderate, ECHO   Myocardial infarction ([place]) 08/23/2018   Seasonal allergies    Spinal stenosis     Medications:  Scheduled:   aspirin EC  81 mg Oral Daily   aspirin  325 mg Oral STAT   atorvastatin  80 mg Oral QHS   ezetimibe  10 mg Oral Daily   famotidine  10 mg Oral Daily   metoprolol tartrate  25 mg Oral BID   oseltamivir  75 mg Oral BID   pregabalin  100 mg Oral QHS   pregabalin  50 mg Oral Daily   Infusions:   heparin 1,200 Units/hr  (11/03/21 2140)   lactated ringers 50 mL/hr at 11/03/21 2129   nitroGLYCERIN 5 mcg/min (11/04/21 0522)    Assessment: 50 yo M to start Heparin drip for Chest pain/ACS. No anticoagulants PTA per Med Rec Baseline labs:  Hgb 16.6  Plt 218  INR 1.2  aPTT 32  Goal of Therapy:  Heparin level 0.3-0.7 units/ml Monitor platelets by anticoagulation protocol: Yes   Plan:  11/10:  HL @ 0508 = 0.79, SUPRAtherapeutic X 1  Will decrease heparin drip rate to 1100 units/hr and recheck HL 6 hrs after rate change.   Aleks Nawrot D 11/04/2021,5:42 AM

## 2021-11-04 NOTE — ED Notes (Signed)
Joana RN aware of assigned bed

## 2021-11-04 NOTE — ED Notes (Signed)
Pt and pt's wife requesting Pt advocate personnel,  stated  " not knowing what is going on and getting angtsy as no cardiologist or doctor has  made rounds for them in the night". Informed pt that no new bed assignment has been given. Has requested hospital bed 2-3hrs ago but no bed received in ED. Pt made aware all medical procedures needed are being done for this pt and night on call MD c/o Dr. Tobie Poet has been notified and made aware of pt's status. Pt and wife at bedside has been made aware of all procedures/ meds given and done by this RN . Charge nurse Caryl Pina requested and ant bedside for this pt at this time

## 2021-11-04 NOTE — Progress Notes (Signed)
[facility] Progress Note  Patient: John Hartman    GUY:[id number]  DOA: 11/03/2021     Date of Service: the patient was seen and examined on 11/04/2021  Chief Complaint  Patient presents with   Chest Pain   Brief hospital course: John Hartman is a 50 y.o. male with medical history significant for aortic atherosclerosis, HFpEF, NSTEMI, coronary artery disease status post PCI with stent placement to mid left circumflex, hyperlipidemia, type 2 diabetes, diabetic polyneuropathy, OSA, who presented to [facility] ED due to sudden onset nonexertional chest pain around 2:20 PM.  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  His pain was severe, sharp radiated to his back.  Associated with nausea, no vomiting, and dyspnea at rest.  His son was diagnosed with the flu a few days ago.  Denies any fevers or significant respiratory symptoms.  Upon presentation to the ED, troponin negative x2, EKG no evidence of acute ischemia, influenza A positive.  EDP discussed case with the patient's cardiologist who recommended holding off heparin drip for now but to start heparin drip if high-sensitivity troponin turn positive.  Due to persistent chest pain patient was started on nitro drip in the ED.  [facility], hospitalist team, was asked to admit.   ED Course: T-max 99.1.  BP 140/101, pulse 120, respiratory rate 22, saturation 98% on room air.  Lab studies significant for acute transaminitis, AST 125, ALT 126, T bilirubin 1.9.   Assessment and Plan: Present on Admission:  Chest pain   Active Problems:   Chest pain   Chest pain rule out ACS History of NSTEMI, coronary disease status post PCI with stent placement Troponin negative x2, twelve-lead EKG nonacute. CT angio chest/abdomen/pelvis for dissection with and without contrast nonacute. serial troponin negative,  Patient was started on heparin IV infusion and nitroglycerin IV infusion, chest pain resolved last night.  Patient was seen by cardiology, s/p  cardiac cath, no PCI was done, patent prior stent. Chest pain most likely musculoskeletal, currently patient is chest pain-free Discontinued IV heparin and IV nitroglycerin infusion Continue monitor on telemetry Follow 2D echocardiogram  Continue as needed medication for pain control     Influenza A infection Influenza A+ on 11/03/2021 Droplet precautions Start Tamiflu 75 mg twice daily x5 days. As needed bronchodilators As needed antitussives   Acute transaminitis suspect in the setting of viral infection. Obtain acute hepatitis panel Obtain right upper quadrant abdominal ultrasound Trend LFTs Mildly elevated lipase, continue to trend, patient denies any abdominal pain   Chronic diastolic CHF Coronary artery disease status post PCI with stent placement Hyperlipidemia Resume home cardiac regimen.   Type 2 diabetes with hyperglycemia Obtain hemoglobin A1c Start insulin sliding scale.   Essential hypertension Resume home oral antihypertensives Monitor vital signs   Hyperlipidemia Resume home Lipitor   Body mass index is 28.59 kg/m.  Interventions:     Diet: Heart healthy/carb modified DVT Prophylaxis: Subcutaneous Lovenox   Advance goals of care discussion: Full code  Family Communication: family was present at bedside, at the time of interview.  The pt provided permission to discuss medical plan with the family. Opportunity was given to ask question and all questions were answered satisfactorily.   Disposition:  Pt is from Home, admitted with chest pain, s/p cardiac cath, still patient is tachycardiac, which precludes a safe discharge. Discharge to home, when stable most likely tomorrow a.m.  Subjective: Patient was admitted overnight due to chest pain which has been resolved, patient was  seen after cardiac cath, tolerated procedure well.  Patient was tachycardic but denied any chest pain or palpitations.   Physical Exam: General:  alert oriented to time,  place, and person.  Appear in no distress, affect appropriate Eyes: PERRLA ENT: Oral Mucosa Clear, moist  Neck: no JVD,  Cardiovascular: S1 and S2 Present, no Murmur,  Respiratory: good respiratory effort, Bilateral Air entry equal and Decreased, no Crackles, no wheezes Abdomen: Bowel Sound present, Soft and no tenderness,  Skin: no rashes Extremities: no Pedal edema, no calf tenderness Neurologic: without any new focal findings Gait not checked due to patient safety concerns  Vitals:   11/04/21 1300 11/04/21 1400 11/04/21 1500 11/04/21 1558  BP: (!) 135/104 (!) 144/101 (!) 150/103   Pulse: (!) 118 61 (!) 122 (!) 119  Resp: (!) 22 14 20 19   Temp:    100 F (37.8 C)  TempSrc:    Oral  SpO2: 92% 94% 96% 94%  Weight:      Height:        Intake/Output Summary (Last 24 hours) at 11/04/2021 1604 Last data filed at 11/04/2021 1224 Gross per 24 hour  Intake 1738.23 ml  Output 2600 ml  Net -861.77 ml   Filed Weights   11/03/21 1606  Weight: 93 kg    Data Reviewed: I have personally reviewed and interpreted daily labs, tele strips, imagings as discussed above. I reviewed all nursing notes, pharmacy notes, vitals, pertinent old records I have discussed plan of care as described above with RN and patient/family.  CBC: Recent Labs  Lab 11/03/21 1609 11/04/21 0508  WBC 7.3 6.6  HGB 16.6 14.7  HCT 50.4 45.4  MCV 82.2 82.7  PLT 218 174   Basic Metabolic Panel: Recent Labs  Lab 11/03/21 1609 11/03/21 1700 11/04/21 0508  NA 135  --  132*  K 4.0  --  4.0  CL 102  --  101  CO2 20*  --  21*  GLUCOSE 122*  --  141*  BUN 14  --  14  CREATININE 0.93  --  0.98  CALCIUM 9.7  --  8.5*  MG  --  2.0 2.0  PHOS  --   --  4.9*    Studies: CARDIAC CATHETERIZATION  Result Date: 11/04/2021   Prox LAD lesion is 30% stenosed.   Previously placed Mid Cx to Dist Cx stent (unknown type) is  widely patent.   There is mild left ventricular systolic dysfunction.   The left  ventricular ejection fraction is 45-50% by visual estimate. 1.  Insignificant coronary artery disease with patent stent mid left circumflex 2.  Near normal left ventricular function, with LVEF 45 to 50% Recommendations 1.  DC heparin 2.  DC nitroglycerin drip 3.  No further cardiac diagnostics at this time 4.  Continue home BP medications 5.  Follow-up with me 1 to 2 weeks   CT Angio Chest/Abd/Pel for Dissection W and/or Wo Contrast  Result Date: 11/03/2021 CLINICAL DATA:  Chest pain or back pain, aortic dissection suspected. Pt has left side chest pain for 2 hours with nausea. Pt took 2 ntg without relief. No sob. Pt alert speech clear. EXAM: CT ANGIOGRAPHY CHEST, ABDOMEN AND PELVIS TECHNIQUE: Non-contrast CT of the chest was initially obtained. Multidetector CT imaging through the chest, abdomen and pelvis was performed using the standard protocol during bolus administration of intravenous contrast. Multiplanar reconstructed images and MIPs were obtained and reviewed to evaluate the vascular anatomy. CONTRAST:  174mL OMNIPAQUE IOHEXOL  350 MG/ML SOLN COMPARISON:  Chest x-ray 09/13/2021, CT angio chest 10/29/2020 FINDINGS: CTA CHEST FINDINGS Cardiovascular: Preferential opacification of the thoracic aorta. No evidence of thoracic aortic aneurysm or dissection. Normal heart size. No significant pericardial effusion. The thoracic aorta is normal in caliber. Mild atherosclerotic plaque of the thoracic aorta. Four-vessel coronary artery calcifications. Mediastinum/Nodes: No enlarged mediastinal, hilar, or axillary lymph nodes. Thyroid gland, trachea, and esophagus demonstrate no significant findings. Lungs/Pleura: No focal consolidation. Couple scattered pulmonary micronodules that are calcified and noncalcified. No pulmonary mass. No pleural effusion. No pneumothorax. Musculoskeletal: No chest wall abnormality. No suspicious lytic or blastic osseous lesions. No acute displaced fracture. Multilevel degenerative  changes of the spine. Review of the MIP images confirms the above findings. CTA ABDOMEN AND PELVIS FINDINGS VASCULAR Aorta: Severe atherosclerotic plaque. Normal caliber aorta without aneurysm, dissection, vasculitis or significant stenosis. Celiac: Mild atherosclerotic plaque. Patent without evidence of aneurysm, dissection, vasculitis or significant stenosis. SMA: Patent without evidence of aneurysm, dissection, vasculitis or significant stenosis. Renals: Mild atherosclerotic plaque. Both renal arteries are patent without evidence of aneurysm, dissection, vasculitis, fibromuscular dysplasia or significant stenosis. IMA: Patent without evidence of aneurysm, dissection, vasculitis or significant stenosis. Inflow: Patent without evidence of aneurysm, dissection, vasculitis or significant stenosis. Veins: No obvious venous abnormality within the limitations of this arterial phase study. Review of the MIP images confirms the above findings. NON-VASCULAR Hepatobiliary: No focal liver abnormality. No gallstones, gallbladder wall thickening, or pericholecystic fluid. No biliary dilatation. Pancreas: No focal lesion. Normal pancreatic contour. No surrounding inflammatory changes. No main pancreatic ductal dilatation. Spleen: Normal in size without focal abnormality. Adrenals/Urinary Tract: No adrenal nodule bilaterally. Bilateral kidneys enhance symmetrically. No hydronephrosis. No hydroureter. The urinary bladder is unremarkable. Stomach/Bowel: Stomach is within normal limits. No evidence of bowel wall thickening or dilatation. Stool throughout the colon. Appendix is normal in caliber with no associated inflammatory changes. Stool is noted within the appendiceal lumen. Lymphatic: No lymphadenopathy. Reproductive: Prominent prostate. Other: No intraperitoneal free fluid. No intraperitoneal free gas. No organized fluid collection. Musculoskeletal: No abdominal wall hernia or abnormality. No suspicious lytic or blastic  osseous lesions. No acute displaced fracture. Multilevel severe degenerative changes of the spine. Review of the MIP images confirms the above findings. IMPRESSION: 1. No acute vascular abnormality. 2. Aortic Atherosclerosis (ICD10-I70.0) including four-vessel coronary artery calcifications. 3. Constipation. 4. Severe multilevel degenerative changes of the lumbar spine. Electronically Signed   By: Iven Finn M.D.   On: 11/03/2021 17:05   US Abdomen Limited RUQ (LIVER/GB)  Result Date: 11/03/2021 CLINICAL DATA:  Transaminitis EXAM: ULTRASOUND ABDOMEN LIMITED RIGHT UPPER QUADRANT COMPARISON:  None. FINDINGS: Gallbladder: No gallstones or wall thickening visualized. No sonographic Murphy sign noted by sonographer. Common bile duct: Diameter: 2 mm Liver: Hyperechoic hepatic parenchyma, reflecting hepatic steatosis, with focal fatty sparing along the gallbladder fossa. Portal vein is patent on color Doppler imaging with normal direction of blood flow towards the liver. Other: None. IMPRESSION: Hepatic steatosis with focal fatty sparing. Electronically Signed   By: Julian Hy M.D.   On: 11/03/2021 23:01    Scheduled Meds:  aspirin EC  81 mg Oral Daily   aspirin  325 mg Oral STAT   atorvastatin  80 mg Oral QHS   bisacodyl  10 mg Oral Once   Chlorhexidine Gluconate Cloth  6 each Topical Q0600   ezetimibe  10 mg Oral Daily   famotidine  10 mg Oral Daily   metoprolol tartrate  25 mg Oral BID   oseltamivir  75 mg Oral BID   polyethylene glycol  17 g Oral Daily   pregabalin  100 mg Oral QHS   pregabalin  50 mg Oral Daily   sodium chloride flush  3 mL Intravenous Q12H   sodium chloride flush  3 mL Intravenous Q12H   Continuous Infusions:  sodium chloride     sodium chloride 1 mL/kg/hr (11/04/21 1224)   lactated ringers Stopped (11/04/21 0853)   PRN Meds: sodium chloride, acetaminophen, bisacodyl, bisacodyl, guaiFENesin-dextromethorphan, hydrALAZINE, ipratropium-albuterol, labetalol,  metoprolol tartrate, morphine injection, nitroGLYCERIN, ondansetron (ZOFRAN) IV, oxyCODONE, sodium chloride flush  Time spent: 35 minutes  Author: Val Riles. MD [facility] 11/04/2021 4:04 PM  To reach On-call, see care teams to locate the attending and reach out to them via [email or web address]. If 7PM-7AM, please contact night-coverage If you still have difficulty reaching the attending provider, please page the [facility] (Director on Call) for [facility] on amion for assistance.

## 2021-11-05 LAB — BASIC METABOLIC PANEL
Anion gap: 9 (ref 5–15)
BUN: 12 mg/dL (ref 6–20)
CO2: 23 mmol/L (ref 22–32)
Calcium: 8.5 mg/dL — ABNORMAL LOW (ref 8.9–10.3)
Chloride: 103 mmol/L (ref 98–111)
Creatinine, Ser: 0.91 mg/dL (ref 0.61–1.24)
GFR, Estimated: >60 mL/min (ref >60)
Glucose, Bld: 121 mg/dL — ABNORMAL HIGH (ref 70–99)
Potassium: 3.8 mmol/L (ref 3.5–5.1)
Sodium: 135 mmol/L (ref 135–145)

## 2021-11-05 LAB — CBC
HCT: 45.7 % (ref 39.0–52.0)
Hemoglobin: 15.1 g/dL (ref 13.0–17.0)
MCH: 27.5 pg (ref 26.0–34.0)
MCHC: 33 g/dL (ref 30.0–36.0)
MCV: 83.1 fL (ref 80.0–100.0)
Platelets: 135 10*3/uL — ABNORMAL LOW (ref 150–400)
RBC: 5.5 MIL/uL (ref 4.22–5.81)
RDW: 14.5 % (ref 11.5–15.5)
WBC: 3.5 10*3/uL — ABNORMAL LOW (ref 4.0–10.5)
nRBC: 0 % (ref 0.0–0.2)

## 2021-11-05 LAB — LIPASE, BLOOD: Lipase: 45 U/L (ref 11–51)

## 2021-11-05 LAB — HEPATITIS PANEL, ACUTE
HCV Ab: NONREACTIVE
Hep A IgM: NONREACTIVE
Hep B C IgM: NONREACTIVE
Hepatitis B Surface Ag: NONREACTIVE

## 2021-11-05 LAB — MAGNESIUM: Magnesium: 2.2 mg/dL (ref 1.7–2.4)

## 2021-11-05 LAB — VITAMIN D 25 HYDROXY (VIT D DEFICIENCY, FRACTURES): Vit D, 25-Hydroxy: 33.66 ng/mL (ref 30–100)

## 2021-11-05 LAB — PHOSPHORUS: Phosphorus: 3.3 mg/dL (ref 2.5–4.6)

## 2021-11-05 MED ORDER — POLYETHYLENE GLYCOL 3350 17 G PO PACK: 17.0000 g | PACK | Freq: Every day | ORAL | 0 refills | Status: DC

## 2021-11-05 NOTE — Discharge Summary (Signed)
[facility] Discharge Summary   Patient: John Hartman OFB:[id number]  PCP: Juanda Bond, NP  Date of admission: 11/03/2021   Date of discharge:  11/05/2021     Discharge Diagnoses:  Principal diagnosis Chest pain, most likely musculoskeletal Active Problems:   NSTEMI (non-ST elevated myocardial infarction) ([place])   Chest pain   Admitted From: Home Disposition:  Home   Recommendations for Outpatient Follow-up:  PCP: in 1 wk Cardio in 1-2 wks  Follow up LABS/TEST:  LFTS   Diet recommendation: Cardiac and Carb modified diet  Activity: The patient is advised to gradually reintroduce usual activities, as tolerated  Discharge Condition: stable  Code Status: Full code   History of present illness: As per the H and P dictated on admission  Hospital Course:  John Hartman is a 50 y.o. male with medical history significant for aortic atherosclerosis, HFpEF, NSTEMI, coronary artery disease status post PCI with stent placement to mid left circumflex, hyperlipidemia, type 2 diabetes, diabetic polyneuropathy, OSA, who presented to [facility] ED due to sudden onset nonexertional chest pain around 2:20 PM.  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  His pain was severe, sharp radiated to his back.  Associated with nausea, no vomiting, and dyspnea at rest.  His son was diagnosed with the flu a few days ago.  Denies any fevers or significant respiratory symptoms.  Upon presentation to the ED, troponin negative x2, EKG no evidence of acute ischemia, influenza A positive.  EDP discussed case with the patient's cardiologist who recommended holding off heparin drip for now but to start heparin drip if high-sensitivity troponin turn positive.  Due to persistent chest pain patient was started on nitro drip in the ED.  [facility], hospitalist team, was asked to admit.   ED Course: T-max 99.1.  BP 140/101, pulse 120, respiratory rate 22, saturation 98% on room air.  Lab studies significant for  acute transaminitis, AST 125, ALT 126, T bilirubin 1.9.  Assessment and Plan: Present on Admission:  Chest pain   Active Problems:   Chest pain   # Chest pain ruled out ACS, most likely musculoskeletal pain. History of NSTEMI, coronary disease status post PCI with stent placement. Troponin negative x2, twelve-lead EKG nonacute. CT angio chest/abdomen/pelvis for dissection with and without contrast nonacute. serial troponin negative, Patient was started on heparin IV infusion and nitroglycerin IV infusion, chest pain resolved last night.   Cardiology consulted, 11/10 s/p cardiac cath: 1.  Insignificant coronary artery disease with patent stent mid left circumflex 2.  Near normal left ventricular function, with LVEF 45 to 50% Recommendations: 1.  DC heparin, 2.  DC nitroglycerin drip, 3.  No further cardiac diagnostics at this time. 4.  Continue home BP medications. 5.  Follow-up with me 1 to 2 weeks Patient remained chest pain-free overnight, discharged today.  Patient agreed with the discharge planning.  Influenza A infection, Influenza A+ on 11/03/2021, was on Droplet precautions S/p Tamiflu 75 mg twice daily given during hospital stay.  Patient remained asymptomatic so did not continue on discharge.  Recommended to follow with PCP patient becomes symptomatic. Acute transaminitis suspect in the setting of viral infection. Negative acute hepatitis panel Right upper quadrant abdominal ultrasound, Hepatic steatosis with focal fatty sparing.  Follow with PCP and repeat LFTs after 1 week. Mildly elevated lipase, trended down to normal limits.patient denies any abdominal pain Chronic diastolic CHF, Coronary artery disease status post PCI with stent placement Hyperlipidemia. Resume home cardiac regimen. Type  2 diabetes with hyperglycemia, resumed home medications continue diabetic diet and follow with PCP for further management. Essential hypertension, Resume home antihypertensives meds, monitor BP at  home and follow with PCP. ody mass index is 28.59 kg/m.  Nutrition Interventions:   - Patient was instructed, not to drive, operate heavy machinery, perform activities at heights, swimming or participation in water activities or provide baby sitting services while on Pain, Sleep and Anxiety Medications; until his outpatient Physician has advised to do so again.  - Also recommended to not to take more than prescribed Pain, Sleep and Anxiety Medications.  Patient was ambulatory without any assistance. On the day of the discharge the patient's vitals were stable, and no other acute medical condition were reported by patient. the patient was felt safe to be discharge at Home .  Consultants: Cardiology Procedures: s/p Cardiac Cath, no intervention required  Discharge Exam: General: Appear in no distress, no Rash; Oral Mucosa Clear, moist. Cardiovascular: S1 and S2 Present, no Murmur, Respiratory: normal respiratory effort, Bilateral Air entry present and no Crackles, no wheezes Abdomen: Bowel Sound present, Soft and no tenderness, no hernia Extremities: no Pedal edema, no calf tenderness Neurology: alert and oriented to time, place, and person affect appropriate.  Filed Weights   11/03/21 1606  Weight: 93 kg   Vitals:   11/05/21 0800 11/05/21 0812  BP: (!) 132/102   Pulse: (!) 112   Resp: 16   Temp:  98.7 F (37.1 C)  SpO2: 94%     DISCHARGE MEDICATION: Allergies as of 11/05/2021       Reactions   Imdur [isosorbide Nitrate]    Lisinopril Swelling   Other Hives   adhesives   Drug [tape] Itching   Rash, blisters   Tapentadol Itching   Rash, blisters   Ace Inhibitors Swelling        Medication List     STOP taking these medications    Rybelsus 3 MG Tabs Generic drug: Semaglutide       TAKE these medications    allopurinol 100 MG tablet Commonly known as: ZYLOPRIM Take 100 mg by mouth daily.   amLODipine 10 MG tablet Commonly known as: NORVASC Take 1  tablet (10 mg total) by mouth daily.   aspirin EC 81 MG tablet Take 81 mg by mouth daily.   atorvastatin 80 MG tablet Commonly known as: LIPITOR Take 80 mg by mouth daily.   cetirizine 10 MG tablet Commonly known as: ZYRTEC Take 10 mg by mouth at bedtime.   diphenhydrAMINE 25 MG tablet Commonly known as: BENADRYL Take 50 mg by mouth at bedtime.   EPINEPHrine 0.3 mg/0.3 mL Soaj injection Commonly known as: EPI-PEN Inject into the muscle as directed.   ezetimibe 10 MG tablet Commonly known as: ZETIA Take 1 tablet (10 mg total) by mouth daily.   famotidine 10 MG tablet Commonly known as: PEPCID Take 10 mg by mouth daily.   hydrALAZINE 25 MG tablet Commonly known as: APRESOLINE Take 1 tablet (25 mg total) by mouth 2 (two) times daily.   indomethacin 25 MG capsule Commonly known as: INDOCIN Take 1 capsule (25 mg total) by mouth 3 (three) times daily as needed.   Magnesium 500 MG Caps Take by mouth.   metoprolol tartrate 25 MG tablet Commonly known as: LOPRESSOR Take 2 tablets (50 mg total) by mouth 2 (two) times daily.   nitroGLYCERIN 0.4 MG SL tablet Commonly known as: NITROSTAT Place 1 tablet (0.4 mg total) under the tongue every  5 (five) minutes as needed for chest pain.   OneTouch Delica Lancets 37S Misc Apply 1 each topically 2 (two) times daily.   OneTouch Ultra test strip Generic drug: glucose blood SMARTSIG:Via Meter   polyethylene glycol 17 g packet Commonly known as: MIRALAX / GLYCOLAX Take 17 g by mouth daily. Start taking on: November 06, 2021   pregabalin 50 MG capsule Commonly known as: LYRICA 1 capsule in the morning and 2 capsules at night. What changed:  how much to take how to take this when to take this   tiZANidine 4 MG tablet Commonly known as: Zanaflex Take 1 tablet (4 mg total) by mouth every 8 (eight) hours as needed for muscle spasms.   Trulicity 1.5 EG/3.1DV Sopn Generic drug: Dulaglutide Inject 1.5 mg into the skin once a  week.   Xigduo XR 04-999 MG Tb24 Generic drug: Dapagliflozin-metFORMIN HCl ER Take 2 tablets by mouth daily.       Allergies  Allergen Reactions   Imdur [Isosorbide Nitrate]    Lisinopril Swelling   Other Hives    adhesives   Drug [Tape] Itching    Rash, blisters   Tapentadol Itching    Rash, blisters   Ace Inhibitors Swelling   Discharge Instructions     Call MD for:  difficulty breathing, headache or visual disturbances   Complete by: As directed    Call MD for:  extreme fatigue   Complete by: As directed    Call MD for:  persistant dizziness or light-headedness   Complete by: As directed    Call MD for:  persistant nausea and vomiting   Complete by: As directed    Call MD for:  severe uncontrolled pain   Complete by: As directed    Call MD for:  temperature >100.4   Complete by: As directed    Diet - low sodium heart healthy   Complete by: As directed    Discharge instructions   Complete by: As directed    Follow-up with PCP in 1 week Follow-up with cardiologist in 1 to 2 weeks as an outpatient   Increase activity slowly   Complete by: As directed        The results of significant diagnostics from this hospitalization (including imaging, microbiology, ancillary and laboratory) are listed below for reference.    Significant Diagnostic Studies: CARDIAC CATHETERIZATION  Result Date: 11/04/2021   Prox LAD lesion is 30% stenosed.   Previously placed Mid Cx to Dist Cx stent (unknown type) is  widely patent.   There is mild left ventricular systolic dysfunction.   The left ventricular ejection fraction is 45-50% by visual estimate. 1.  Insignificant coronary artery disease with patent stent mid left circumflex 2.  Near normal left ventricular function, with LVEF 45 to 50% Recommendations 1.  DC heparin 2.  DC nitroglycerin drip 3.  No further cardiac diagnostics at this time 4.  Continue home BP medications 5.  Follow-up with me 1 to 2 weeks   CT Angio Chest/Abd/Pel  for Dissection W and/or Wo Contrast  Result Date: 11/03/2021 CLINICAL DATA:  Chest pain or back pain, aortic dissection suspected. Pt has left side chest pain for 2 hours with nausea. Pt took 2 ntg without relief. No sob. Pt alert speech clear. EXAM: CT ANGIOGRAPHY CHEST, ABDOMEN AND PELVIS TECHNIQUE: Non-contrast CT of the chest was initially obtained. Multidetector CT imaging through the chest, abdomen and pelvis was performed using the standard protocol during bolus administration of intravenous contrast.  Multiplanar reconstructed images and MIPs were obtained and reviewed to evaluate the vascular anatomy. CONTRAST:  168mL OMNIPAQUE IOHEXOL 350 MG/ML SOLN COMPARISON:  Chest x-ray 09/13/2021, CT angio chest 10/29/2020 FINDINGS: CTA CHEST FINDINGS Cardiovascular: Preferential opacification of the thoracic aorta. No evidence of thoracic aortic aneurysm or dissection. Normal heart size. No significant pericardial effusion. The thoracic aorta is normal in caliber. Mild atherosclerotic plaque of the thoracic aorta. Four-vessel coronary artery calcifications. Mediastinum/Nodes: No enlarged mediastinal, hilar, or axillary lymph nodes. Thyroid gland, trachea, and esophagus demonstrate no significant findings. Lungs/Pleura: No focal consolidation. Couple scattered pulmonary micronodules that are calcified and noncalcified. No pulmonary mass. No pleural effusion. No pneumothorax. Musculoskeletal: No chest wall abnormality. No suspicious lytic or blastic osseous lesions. No acute displaced fracture. Multilevel degenerative changes of the spine. Review of the MIP images confirms the above findings. CTA ABDOMEN AND PELVIS FINDINGS VASCULAR Aorta: Severe atherosclerotic plaque. Normal caliber aorta without aneurysm, dissection, vasculitis or significant stenosis. Celiac: Mild atherosclerotic plaque. Patent without evidence of aneurysm, dissection, vasculitis or significant stenosis. SMA: Patent without evidence of aneurysm,  dissection, vasculitis or significant stenosis. Renals: Mild atherosclerotic plaque. Both renal arteries are patent without evidence of aneurysm, dissection, vasculitis, fibromuscular dysplasia or significant stenosis. IMA: Patent without evidence of aneurysm, dissection, vasculitis or significant stenosis. Inflow: Patent without evidence of aneurysm, dissection, vasculitis or significant stenosis. Veins: No obvious venous abnormality within the limitations of this arterial phase study. Review of the MIP images confirms the above findings. NON-VASCULAR Hepatobiliary: No focal liver abnormality. No gallstones, gallbladder wall thickening, or pericholecystic fluid. No biliary dilatation. Pancreas: No focal lesion. Normal pancreatic contour. No surrounding inflammatory changes. No main pancreatic ductal dilatation. Spleen: Normal in size without focal abnormality. Adrenals/Urinary Tract: No adrenal nodule bilaterally. Bilateral kidneys enhance symmetrically. No hydronephrosis. No hydroureter. The urinary bladder is unremarkable. Stomach/Bowel: Stomach is within normal limits. No evidence of bowel wall thickening or dilatation. Stool throughout the colon. Appendix is normal in caliber with no associated inflammatory changes. Stool is noted within the appendiceal lumen. Lymphatic: No lymphadenopathy. Reproductive: Prominent prostate. Other: No intraperitoneal free fluid. No intraperitoneal free gas. No organized fluid collection. Musculoskeletal: No abdominal wall hernia or abnormality. No suspicious lytic or blastic osseous lesions. No acute displaced fracture. Multilevel severe degenerative changes of the spine. Review of the MIP images confirms the above findings. IMPRESSION: 1. No acute vascular abnormality. 2. Aortic Atherosclerosis (ICD10-I70.0) including four-vessel coronary artery calcifications. 3. Constipation. 4. Severe multilevel degenerative changes of the lumbar spine. Electronically Signed   By: Iven Finn M.D.   On: 11/03/2021 17:05   US Abdomen Limited RUQ (LIVER/GB)  Result Date: 11/03/2021 CLINICAL DATA:  Transaminitis EXAM: ULTRASOUND ABDOMEN LIMITED RIGHT UPPER QUADRANT COMPARISON:  None. FINDINGS: Gallbladder: No gallstones or wall thickening visualized. No sonographic Murphy sign noted by sonographer. Common bile duct: Diameter: 2 mm Liver: Hyperechoic hepatic parenchyma, reflecting hepatic steatosis, with focal fatty sparing along the gallbladder fossa. Portal vein is patent on color Doppler imaging with normal direction of blood flow towards the liver. Other: None. IMPRESSION: Hepatic steatosis with focal fatty sparing. Electronically Signed   By: Julian Hy M.D.   On: 11/03/2021 23:01    Microbiology: Recent Results (from the past 240 hour(s))  Resp Panel by RT-PCR (Flu A&B, Covid) Nasopharyngeal Swab     Status: Abnormal   Collection Time: 11/03/21  4:30 PM   Specimen: Nasopharyngeal Swab; Nasopharyngeal(NP) swabs in vial transport medium  Result Value Ref Range Status   SARS Coronavirus  2 by RT PCR NEGATIVE NEGATIVE Final    Comment: (NOTE) SARS-CoV-2 target nucleic acids are NOT DETECTED.  The SARS-CoV-2 RNA is generally detectable in upper respiratory specimens during the acute phase of infection. The lowest concentration of SARS-CoV-2 viral copies this assay can detect is 138 copies/mL. A negative result does not preclude SARS-Cov-2 infection and should not be used as the sole basis for treatment or other patient management decisions. A negative result may occur with  improper specimen collection/handling, submission of specimen other than nasopharyngeal swab, presence of viral mutation(s) within the areas targeted by this assay, and inadequate number of viral copies(<138 copies/mL). A negative result must be combined with clinical observations, patient history, and epidemiological information. The expected result is Negative.  Fact Sheet for Patients:   EntrepreneurPulse.com.au  Fact Sheet for Healthcare Providers:  IncredibleEmployment.be  This test is no t yet approved or cleared by the [place] FDA and  has been authorized for detection and/or diagnosis of SARS-CoV-2 by FDA under an Emergency Use Authorization (EUA). This EUA will remain  in effect (meaning this test can be used) for the duration of the COVID-19 declaration under Section 564(b)(1) of the Act, 21 U.S.C.section 360bbb-3(b)(1), unless the authorization is terminated  or revoked sooner.       Influenza A by PCR POSITIVE (A) NEGATIVE Final   Influenza B by PCR NEGATIVE NEGATIVE Final    Comment: (NOTE) The Xpert Xpress SARS-CoV-2/FLU/RSV plus assay is intended as an aid in the diagnosis of influenza from Nasopharyngeal swab specimens and should not be used as a sole basis for treatment. Nasal washings and aspirates are unacceptable for Xpert Xpress SARS-CoV-2/FLU/RSV testing.  Fact Sheet for Patients: EntrepreneurPulse.com.au  Fact Sheet for Healthcare Providers: IncredibleEmployment.be  This test is not yet approved or cleared by the [place] FDA and has been authorized for detection and/or diagnosis of SARS-CoV-2 by FDA under an Emergency Use Authorization (EUA). This EUA will remain in effect (meaning this test can be used) for the duration of the COVID-19 declaration under Section 564(b)(1) of the Act, 21 U.S.C. section 360bbb-3(b)(1), unless the authorization is terminated or revoked.  Performed at [facility], [address] [place], [place] [address]   MRSA Next Gen by PCR, Nasal     Status: None   Collection Time: 11/04/21  6:28 AM   Specimen: Nasal Mucosa; Nasal Swab  Result Value Ref Range Status   MRSA by PCR Next Gen NOT DETECTED NOT DETECTED Final    Comment: (NOTE) The GeneXpert MRSA Assay (FDA approved for NASAL specimens only), is one component  of a comprehensive MRSA colonization surveillance program. It is not intended to diagnose MRSA infection nor to guide or monitor treatment for MRSA infections. Test performance is not FDA approved in patients less than 37 years old. Performed at [facility], [address] [place], [place] [address]      Labs: CBC: Recent Labs  Lab 11/03/21 1609 11/04/21 0508 11/05/21 0515  WBC 7.3 6.6 3.5*  HGB 16.6 14.7 15.1  HCT 50.4 45.4 45.7  MCV 82.2 82.7 83.1  PLT 218 174 562*   Basic Metabolic Panel: Recent Labs  Lab 11/03/21 1609 11/03/21 1700 11/04/21 0508 11/05/21 0515  NA 135  --  132* 135  K 4.0  --  4.0 3.8  CL 102  --  101 103  CO2 20*  --  21* 23  GLUCOSE 122*  --  141* 121*  BUN 14  --  14 12  CREATININE 0.93  --  0.98 0.91  CALCIUM 9.7  --  8.5* 8.5*  MG  --  2.0 2.0 2.2  PHOS  --   --  4.9* 3.3   Liver Function Tests: Recent Labs  Lab 11/03/21 1700 11/04/21 0508  AST 125* 97*  ALT 136* 125*  ALKPHOS 82 75  BILITOT 1.9* 2.3*  PROT 7.7 7.6  ALBUMIN 4.2 4.6   Recent Labs  Lab 11/03/21 1700 11/05/21 0515  LIPASE 89* 45   No results for input(s): AMMONIA in the last 168 hours. Cardiac Enzymes: No results for input(s): CKTOTAL, CKMB, CKMBINDEX, TROPONINI in the last 168 hours. BNP (last 3 results) No results for input(s): BNP in the last 8760 hours. CBG: Recent Labs  Lab 11/03/21 1931 11/04/21 0622 11/04/21 0843  GLUCAP 117* 126* 150*    Time spent: 35 minutes  Signed:  Val Riles  [facility]  11/05/2021 12:13 PM

## 2022-02-14 ENCOUNTER — Other Ambulatory Visit: Payer: Self-pay | Admitting: Student in an Organized Health Care Education/Training Program

## 2022-02-14 ENCOUNTER — Encounter: Payer: Self-pay | Admitting: Student in an Organized Health Care Education/Training Program

## 2022-02-14 DIAGNOSIS — M5416 Radiculopathy, lumbar region: Secondary | ICD-10-CM

## 2022-02-14 NOTE — Progress Notes (Signed)
Orders Placed This Encounter  Procedures   Lumbar Epidural Injection    Standing Status:   Future    Standing Expiration Date:   03/14/2022    Scheduling Instructions:     Procedure: Interlaminar Lumbar Epidural Steroid injection (LESI)            Laterality: Midline     L-ESI prn    Order Specific Question:   Where will this procedure be performed?    Answer:   [facility]

## 2022-02-15 ENCOUNTER — Encounter: Payer: Self-pay | Admitting: Student in an Organized Health Care Education/Training Program

## 2022-02-15 ENCOUNTER — Ambulatory Visit
Payer: No Typology Code available for payment source | Attending: Student in an Organized Health Care Education/Training Program | Admitting: Student in an Organized Health Care Education/Training Program

## 2022-02-15 ENCOUNTER — Other Ambulatory Visit: Payer: Self-pay

## 2022-02-15 VITALS — BP 149/106 | HR 95 | Temp 98.1°F | Resp 16 | Ht 71.0 in | Wt 210.0 lb

## 2022-02-15 DIAGNOSIS — M5416 Radiculopathy, lumbar region: Secondary | ICD-10-CM | POA: Insufficient documentation

## 2022-02-15 DIAGNOSIS — G894 Chronic pain syndrome: Secondary | ICD-10-CM | POA: Insufficient documentation

## 2022-02-15 DIAGNOSIS — M5136 Other intervertebral disc degeneration, lumbar region: Secondary | ICD-10-CM | POA: Insufficient documentation

## 2022-02-15 DIAGNOSIS — M47816 Spondylosis without myelopathy or radiculopathy, lumbar region: Secondary | ICD-10-CM | POA: Diagnosis not present

## 2022-02-15 MED ORDER — KETOROLAC TROMETHAMINE 30 MG/ML IJ SOLN
30.0000 mg | Freq: Once | INTRAMUSCULAR | Status: AC
  Administered 2022-02-15: 30 mg via INTRAMUSCULAR
  Filled 2022-02-15: qty 1

## 2022-02-15 MED ORDER — ORPHENADRINE CITRATE 30 MG/ML IJ SOLN
30.0000 mg | Freq: Once | INTRAMUSCULAR | Status: AC
  Administered 2022-02-15: 30 mg via INTRAMUSCULAR
  Filled 2022-02-15: qty 2

## 2022-02-15 NOTE — Telephone Encounter (Signed)
Called patient, he just wants a Toradol/Norflex injection. Dr. Holley Raring ok for patient to come today.

## 2022-02-15 NOTE — Progress Notes (Signed)
PROVIDER NOTE: Information contained herein reflects review and annotations entered in association with encounter. Interpretation of such information and data should be left to medically-trained personnel. Information provided to patient can be located elsewhere in the medical record under "Patient Instructions". Document created using STT-dictation technology, any transcriptional errors that may result from process are unintentional.    Patient: John Hartman  Service Category: E/M  Provider: Gillis Santa, MD  DOB: April 05, 1971  DOS: 02/15/2022  Specialty: Interventional Pain Management  MRN: [id number]  Setting: Ambulatory outpatient  PCP: Juanda Bond, NP  Type: Established Patient    Referring Provider: Juanda Bond, NP  Location: Office  Delivery: Face-to-face     HPI  [name], a 51 y.o. year old male, is here today because of his Lumbar radiculopathy [M54.16]. [name] primary complain today is Back Pain (lower) Last encounter: My last encounter with him was on 10/06/2021. Pertinent problems: [name] has Coronary artery disease; Chronic lower back pain; Nephrolithiasis; Chronic pain syndrome; Lumbar radiculopathy; Spinal stenosis of lumbar region with neurogenic claudication; S/P cervical spinal fusion; and Back muscle spasm on their pertinent problem list. Pain Assessment: Severity of Chronic pain is reported as a 6 /10. Location: Back Left/down to left buttocks. Onset: More than a month ago. Quality: Aching, Numbness, Burning, Constant. Timing: Constant. Modifying factor(s): standing. Vitals:  height is '5\' 11"'  (1.803 m) and weight is 210 lb (95.3 kg). His temperature is 98.1 F (36.7 C). His blood pressure is 149/106 (abnormal) and his pulse is 95. His respiration is 16 and oxygen saturation is 100%.   Reason for encounter: Increased acute on chronic low back pain.  Comes in for intramuscular Norflex and Toradol injection.  ROS  Constitutional: Denies any fever or  chills Gastrointestinal: No reported hemesis, hematochezia, vomiting, or acute GI distress Musculoskeletal:  Increased low back pain Neurological: No reported episodes of acute onset apraxia, aphasia, dysarthria, agnosia, amnesia, paralysis, loss of coordination, or loss of consciousness  Medication Review  Dapagliflozin-metFORMIN HCl ER, Dulaglutide, EPINEPHrine, Magnesium, OneTouch Delica Lancets 44Q, allopurinol, amLODipine, aspirin EC, atorvastatin, cetirizine, diphenhydrAMINE, ezetimibe, famotidine, glucose blood, hydrALAZINE, indomethacin, metFORMIN, metoprolol tartrate, nitroGLYCERIN, polyethylene glycol, pregabalin, and tiZANidine  History Review  Allergy: [name] is allergic to imdur [isosorbide nitrate], lisinopril, other, drug [tape], tapentadol, and ace inhibitors. Drug: [name]  reports no history of drug use. Alcohol:  reports no history of alcohol use. Tobacco:  reports that he quit smoking about 18 years ago. His smoking use included cigarettes. He has a 10.00 pack-year smoking history. He has never used smokeless tobacco. Social: [name]  reports that he quit smoking about 18 years ago. His smoking use included cigarettes. He has a 10.00 pack-year smoking history. He has never used smokeless tobacco. He reports that he does not drink alcohol and does not use drugs. Medical:  has a past medical history of Angioedema, Bronchitis, DDD (degenerative disc disease), cervical, Diabetes mellitus ([place]), Fatty liver (03/31/2017), GERD (gastroesophageal reflux disease), Gout, Heart attack ([place]), HTN (hypertension), Hypertension, IBS (irritable bowel syndrome), Kidney stone, LVH (left ventricular hypertrophy) (11/24/2018), Myocardial infarction ([place]) (08/23/2018), Seasonal allergies, and Spinal stenosis. Surgical: John Hartman  has a past surgical history that includes Shoulder surgery (Left, 2007); Nasal septum surgery (2004); ACDF with fusion (2007); Testicular torsion (1980s); Vasectomy;  Colonoscopy with propofol (N/A, 06/26/2015); Cardiac catheterization (Left, 08/15/2016); Coronary/Graft Acute MI Revascularization (N/A, 08/23/2018); LEFT HEART CATH AND CORONARY ANGIOGRAPHY (N/A, 08/23/2018); Cystoscopy with biopsy (N/A, 11/22/2019); Cystoscopy/ureteroscopy/holmium laser/stent placement (Left, 11/22/2019);  Shoulder arthroscopy w/ rotator cuff repair (10/01/2019); LEFT HEART CATH AND CORONARY ANGIOGRAPHY (N/A, 02/10/2020); Cystoscopy/ureteroscopy/holmium laser (02/10/2020); Cystoscopy w/ retrogrades (02/10/2020); Extracorporeal shock wave lithotripsy (Left, 10/29/2020); Left Heart Cath (N/A, 11/04/2021); and CORONARY ANGIOGRAPHY (N/A, 11/04/2021). Family: family history includes Allergies in his son; Alzheimer's disease in his maternal grandmother; Arrhythmia in his mother; Breast cancer in his mother; Cancer in his maternal grandfather and mother; Diabetes in his father; Heart disease in his brother; Hypertension in his father; Skin cancer in his mother.  Laboratory Chemistry Profile   Renal Lab Results  Component Value Date   BUN 12 11/05/2021   CREATININE 0.91 25/36/6440   BCR NOT APPLICABLE 34/74/2595   GFRAA >60 06/17/2020   GFRNONAA >60 11/05/2021    Hepatic Lab Results  Component Value Date   AST 97 (H) 11/04/2021   ALT 125 (H) 11/04/2021   ALBUMIN 4.6 11/04/2021   ALKPHOS 75 11/04/2021   HCVAB NON REACTIVE 11/03/2021   LIPASE 45 11/05/2021    Electrolytes Lab Results  Component Value Date   NA 135 11/05/2021   K 3.8 11/05/2021   CL 103 11/05/2021   CALCIUM 8.5 (L) 11/05/2021   MG 2.2 11/05/2021   PHOS 3.3 11/05/2021    Bone Lab Results  Component Value Date   VD25OH 33.66 11/04/2021    Inflammation (CRP: Acute Phase) (ESR: Chronic Phase) Lab Results  Component Value Date   LATICACIDVEN 1.5 10/29/2020         Note: Above Lab results reviewed.  Recent Imaging Review  CARDIAC CATHETERIZATION   Prox LAD lesion is 30% stenosed.   Previously placed Mid Cx to  Dist Cx stent (unknown type) is  widely  patent.   There is mild left ventricular systolic dysfunction.   The left ventricular ejection fraction is 45-50% by visual estimate.  1.  Insignificant coronary artery disease with patent stent mid left  circumflex 2.  Near normal left ventricular function, with LVEF 45 to 50%  Recommendations  1.  DC heparin 2.  DC nitroglycerin drip 3.  No further cardiac diagnostics at this time 4.  Continue home BP medications 5.  Follow-up with me 1 to 2 weeks Note: Reviewed        Physical Exam  General appearance: Well nourished, well developed, and well hydrated. In no apparent acute distress Mental status: Alert, oriented x 3 (person, place, & time)       Respiratory: No evidence of acute respiratory distress Eyes: PERLA Vitals: BP (!) 149/106    Pulse 95    Temp 98.1 F (36.7 C)    Resp 16    Ht '5\' 11"'  (1.803 m)    Wt 210 lb (95.3 kg)    SpO2 100%    BMI 29.29 kg/m  BMI: Estimated body mass index is 29.29 kg/m as calculated from the following:   Height as of this encounter: '5\' 11"'  (1.803 m).   Weight as of this encounter: 210 lb (95.3 kg). Ideal: Ideal body weight: 75.3 kg (166 lb 0.1 oz) Adjusted ideal body weight: 83.3 kg (183 lb 9.7 oz)  Increased low back pain  Assessment   Status Diagnosis  Controlled Having a Flare-up Having a Flare-up 1. Lumbar radiculopathy   2. Lumbar facet arthropathy   3. Degenerative disc disease, lumbar   4. Chronic pain syndrome      Plan of Care    [name] has a current medication list which includes the following long-term medication(s): allopurinol, amlodipine, ezetimibe,  nitroglycerin, pregabalin, hydralazine, metoprolol tartrate, and [DISCONTINUED] metformin.  Pharmacotherapy (Medications Ordered): Meds ordered this encounter  Medications   orphenadrine (NORFLEX) injection 30 mg   ketorolac (TORADOL) 30 MG/ML injection 30 mg   Orders:  No orders of the defined types were placed  in this encounter.  Follow-up plan:   Return for PRN.     Right T1-T2 ESI 07/21/20, 09/01/21; RIGHT GONB 09/01/21      Recent Visits No visits were found meeting these conditions. Showing recent visits within past 90 days and meeting all other requirements Today's Visits Date Type Provider Dept  02/15/22 Procedure visit Gillis Santa, MD [facility]  Showing today's visits and meeting all other requirements Future Appointments No visits were found meeting these conditions. Showing future appointments within next 90 days and meeting all other requirements  I discussed the assessment and treatment plan with the patient. The patient was provided an opportunity to ask questions and all were answered. The patient agreed with the plan and demonstrated an understanding of the instructions.  Patient advised to call back or seek an in-person evaluation if the symptoms or condition worsens.  Duration of encounter:50mnutes.  Note by: BGillis Santa MD Date: 02/15/2022; Time: 11:33 AM

## 2022-03-17 ENCOUNTER — Other Ambulatory Visit: Payer: Self-pay | Admitting: Student in an Organized Health Care Education/Training Program

## 2022-03-17 DIAGNOSIS — M5416 Radiculopathy, lumbar region: Secondary | ICD-10-CM

## 2022-03-17 DIAGNOSIS — G894 Chronic pain syndrome: Secondary | ICD-10-CM

## 2022-07-05 ENCOUNTER — Ambulatory Visit
Admission: RE | Admit: 2022-07-05 | Discharge: 2022-07-05 | Disposition: A | Discharge status: Home / Self Care | Payer: No Typology Code available for payment source | Attending: Urology | Admitting: Urology

## 2022-07-05 ENCOUNTER — Other Ambulatory Visit: Payer: Self-pay | Admitting: *Deleted

## 2022-07-05 ENCOUNTER — Encounter: Payer: Self-pay | Admitting: Urology

## 2022-07-05 ENCOUNTER — Ambulatory Visit
Admission: RE | Admit: 2022-07-05 | Discharge: 2022-07-05 | Disposition: A | Discharge status: Home / Self Care | Payer: No Typology Code available for payment source | Source: Ambulatory Visit | Attending: Urology | Admitting: Urology

## 2022-07-05 ENCOUNTER — Ambulatory Visit: Payer: No Typology Code available for payment source | Admitting: Urology

## 2022-07-05 VITALS — BP 119/85 | HR 84 | Ht 71.0 in | Wt 214.0 lb

## 2022-07-05 DIAGNOSIS — N200 Calculus of kidney: Secondary | ICD-10-CM | POA: Insufficient documentation

## 2022-07-05 DIAGNOSIS — Z87442 Personal history of urinary calculi: Secondary | ICD-10-CM | POA: Diagnosis not present

## 2022-07-05 NOTE — Progress Notes (Signed)
   07/05/2022 1:11 PM   John Hartman October 19, 1971 [id number]  Reason for visit: Follow up nephrolithiasis  HPI: 51 year old male with extensive cardiac history as well as history of recurrent nephrolithiasis.  He has not tolerated ureteral stents well in the past, but did well with ureteroscopy when a stent was not placed.  He also has a history of left-sided shockwave lithotripsy for a 6 mm nonobstructing left lower pole stone with Dr. Erlene Quan in October 3748 which was complicated by 7 cm left renal hematoma that required admission for pain and nausea control, but no transfusion.  Blood pressures have been normal since that time, including normal today at 119/85.  He denies any urology problems over the last year.  I personally viewed and interpreted the CT abdomen and pelvis from November 2022 as well as the KUB today that shows no evidence of stones or sequela from prior renal hematoma.  We discussed general stone prevention strategies including adequate hydration with goal of producing 2.5 L of urine daily, increasing citric acid intake, increasing calcium intake during high oxalate meals, minimizing animal protein, and decreasing salt intake. Information about dietary recommendations given today.   RTC 1 year KUB prior  Billey Co, MD  [facility] [address], [place], [place] [address] [phone]

## 2022-07-05 NOTE — Patient Instructions (Signed)
Dietary Guidelines to Help Prevent Kidney Stones Kidney stones are deposits of minerals and salts that form inside your kidneys. Your risk of developing kidney stones may be greater depending on your diet, your lifestyle, the medicines you take, and whether you have certain medical conditions. Most people can lower their chances of developing kidney stones by following the instructions below. Your dietitian may give you more specific instructions depending on your overall health and the type of kidney stones you tend to develop. What are tips for following this plan? Reading food labels  Choose foods with "no salt added" or "low-salt" labels. Limit your salt (sodium) intake to less than 1,500 mg a day. Choose foods with calcium for each meal and snack. Try to eat about 300 mg of calcium at each meal. Foods that contain 200-500 mg of calcium a serving include: 8 oz (237 mL) of milk, calcium-fortifiednon-dairy milk, and calcium-fortifiedfruit juice. Calcium-fortified means that calcium has been added to these drinks. 8 oz (237 mL) of kefir, yogurt, and soy yogurt. 4 oz (114 g) of tofu. 1 oz (28 g) of cheese. 1 cup (150 g) of dried figs. 1 cup (91 g) of cooked broccoli. One 3 oz (85 g) can of sardines or mackerel. Most people need 1,000-1,500 mg of calcium a day. Talk to your dietitian about how much calcium is recommended for you. Shopping Buy plenty of fresh fruits and vegetables. Most people do not need to avoid fruits and vegetables, even if these foods contain nutrients that may contribute to kidney stones. When shopping for convenience foods, choose: Whole pieces of fruit. Pre-made salads with dressing on the side. Low-fat fruit and yogurt smoothies. Avoid buying frozen meals or prepared deli foods. These can be high in sodium. Look for foods with live cultures, such as yogurt and kefir. Choose high-fiber grains, such as whole-wheat breads, oat bran, and wheat cereals. Cooking Do not add  salt to food when cooking. Place a salt shaker on the table and allow each person to add his or her own salt to taste. Use vegetable protein, such as beans, textured vegetable protein (TVP), or tofu, instead of meat in pasta, casseroles, and soups. Meal planning Eat less salt, if told by your dietitian. To do this: Avoid eating processed or pre-made food. Avoid eating fast food. Eat less animal protein, including cheese, meat, poultry, or fish, if told by your dietitian. To do this: Limit the number of times you have meat, poultry, fish, or cheese each week. Eat a diet free of meat at least 2 days a week. Eat only one serving each day of meat, poultry, fish, or seafood. When you prepare animal protein, cut pieces into small portion sizes. For most meat and fish, one serving is about the size of the palm of your hand. Eat at least five servings of fresh fruits and vegetables each day. To do this: Keep fruits and vegetables on hand for snacks. Eat one piece of fruit or a handful of berries with breakfast. Have a salad and fruit at lunch. Have two kinds of vegetables at dinner. Limit foods that are high in a substance called oxalate. These include: Spinach (cooked), rhubarb, beets, sweet potatoes, and Swiss chard. Peanuts. Potato chips, french fries, and baked potatoes with skin on. Nuts and nut products. Chocolate. If you regularly take a diuretic medicine, make sure to eat at least 1 or 2 servings of fruits or vegetables that are high in potassium each day. These include: Avocado. Banana. Orange, prune,   carrot, or tomato juice. Baked potato. Cabbage. Beans and split peas. Lifestyle  Drink enough fluid to keep your urine pale yellow. This is the most important thing you can do. Spread your fluid intake throughout the day. If you drink alcohol: Limit how much you use to: 0-1 drink a day for women who are not pregnant. 0-2 drinks a day for men. Be aware of how much alcohol is in your  drink. In the [place] one drink equals one 12 oz bottle of beer (355 mL), one 5 oz glass of wine (148 mL), or one 1 oz glass of hard liquor (44 mL). Lose weight if told by your health care provider. Work with your dietitian to find an eating plan and weight loss strategies that work best for you. General information Talk to your health care provider and dietitian about taking daily supplements. You may be told the following depending on your health and the cause of your kidney stones: Not to take supplements with vitamin C. To take a calcium supplement. To take a daily probiotic supplement. To take other supplements such as magnesium, fish oil, or vitamin B6. Take over-the-counter and prescription medicines only as told by your health care provider. These include supplements. What foods should I limit? Limit your intake of the following foods, or eat them as told by your dietitian. Vegetables Spinach. Rhubarb. Beets. Canned vegetables. Pickles. Olives. Baked potatoes with skin. Grains Wheat bran. Baked goods. Salted crackers. Cereals high in sugar. Meats and other proteins Nuts. Nut butters. Large portions of meat, poultry, or fish. Salted, precooked, or cured meats, such as sausages, meat loaves, and hot dogs. Dairy Cheese. Beverages Regular soft drinks. Regular vegetable juice. Seasonings and condiments Seasoning blends with salt. Salad dressings. Soy sauce. Ketchup. Barbecue sauce. Other foods Canned soups. Canned pasta sauce. Casseroles. Pizza. Lasagna. Frozen meals. Potato chips. French fries. The items listed above may not be a complete list of foods and beverages you should limit. Contact a dietitian for more information. What foods should I avoid? Talk to your dietitian about specific foods you should avoid based on the type of kidney stones you have and your overall health. Fruits Grapefruit. The item listed above may not be a complete list of foods and beverages you should  avoid. Contact a dietitian for more information. Summary Kidney stones are deposits of minerals and salts that form inside your kidneys. You can lower your risk of kidney stones by making changes to your diet. The most important thing you can do is drink enough fluid. Drink enough fluid to keep your urine pale yellow. Talk to your dietitian about how much calcium you should have each day, and eat less salt and animal protein as told by your dietitian. This information is not intended to replace advice given to you by your health care provider. Make sure you discuss any questions you have with your health care provider. Document Revised: 08/23/2021 Document Reviewed: 08/23/2021 Elsevier Patient Education  2023 [employer]  

## 2022-07-07 ENCOUNTER — Encounter: Payer: Self-pay | Admitting: Student in an Organized Health Care Education/Training Program

## 2022-07-07 ENCOUNTER — Telehealth: Payer: Self-pay | Admitting: Student in an Organized Health Care Education/Training Program

## 2022-07-07 ENCOUNTER — Ambulatory Visit
Payer: No Typology Code available for payment source | Attending: Student in an Organized Health Care Education/Training Program | Admitting: Student in an Organized Health Care Education/Training Program

## 2022-07-07 VITALS — BP 131/99 | HR 83 | Temp 97.4°F | Resp 16 | Ht 71.0 in | Wt 210.0 lb

## 2022-07-07 DIAGNOSIS — M5416 Radiculopathy, lumbar region: Secondary | ICD-10-CM | POA: Insufficient documentation

## 2022-07-07 MED ORDER — ORPHENADRINE CITRATE 30 MG/ML IJ SOLN
30.0000 mg | Freq: Once | INTRAMUSCULAR | Status: DC
Start: 2022-07-07 — End: 2022-07-07

## 2022-07-07 MED ORDER — KETOROLAC TROMETHAMINE 30 MG/ML IJ SOLN
INTRAMUSCULAR | Status: AC
  Filled 2022-07-07: qty 1

## 2022-07-07 MED ORDER — KETOROLAC TROMETHAMINE 30 MG/ML IJ SOLN
30.0000 mg | Freq: Once | INTRAMUSCULAR | Status: AC
Start: 2022-07-07 — End: 2022-07-07
  Administered 2022-07-07: 30 mg via INTRAMUSCULAR

## 2022-07-07 NOTE — Addendum Note (Signed)
Addended by: Gillis Santa on: 07/07/2022 01:19 PM   Modules accepted: Orders

## 2022-07-07 NOTE — Progress Notes (Signed)
Safety precautions to be maintained throughout the outpatient stay will include: orient to surroundings, keep bed in low position, maintain call bell within reach at all times, provide assistance with transfer out of bed and ambulation.  

## 2022-07-07 NOTE — Progress Notes (Addendum)
PROVIDER NOTE: Information contained herein reflects review and annotations entered in association with encounter. Interpretation of such information and data should be left to medically-trained personnel. Information provided to patient can be located elsewhere in the medical record under "Patient Instructions". Document created using STT-dictation technology, any transcriptional errors that may result from process are unintentional.    Patient: John Hartman  Service Category: E/M  Provider: Gillis Santa, MD  DOB: 1971-11-19  DOS: 07/07/2022  Specialty: Interventional Pain Management  MRN: [id number]  Setting: Ambulatory outpatient  PCP: Juanda Bond, NP  Type: Established Patient    Referring Provider: Juanda Bond, NP  Location: Office  Delivery: Face-to-face     HPI  John Hartman, a 51 y.o. year old male, is here today because of his Lumbar radiculopathy [M54.16]. Mr. Biggins primary complain today is Hip Pain (LEFT) Last encounter: My last encounter with him was on 07/07/2022. Pertinent problems: John Hartman has Coronary artery disease; Chronic lower back pain; Nephrolithiasis; Chronic pain syndrome; Lumbar radiculopathy; Spinal stenosis of lumbar region with neurogenic claudication; S/P cervical spinal fusion; and Back muscle spasm on their pertinent problem list. Pain Assessment: Severity of Chronic pain is reported as a 8 /10. Location: Hip Left/down left leg. Onset: More than a month ago. Quality:  . Timing: Constant. Modifying factor(s):  Marland Kitchen Vitals:  height is '5\' 11"'  (1.803 m) and weight is 210 lb (95.3 kg). His temporal temperature is 97.4 F (36.3 C) (abnormal). His blood pressure is 131/99 (abnormal) and his pulse is 83. His respiration is 16 and oxygen saturation is 100%.   Reason for encounter:   Nursing only visit for intramuscular Toradol given increased lower back pain with radiation into left hip and left leg in a dermatomal fashion related to radicular pain flare.  ROS   Constitutional: Denies any fever or chills Gastrointestinal: No reported hemesis, hematochezia, vomiting, or acute GI distress Musculoskeletal:  Left low back pain with radiation to left hip and left leg. Neurological: No reported episodes of acute onset apraxia, aphasia, dysarthria, agnosia, amnesia, paralysis, loss of coordination, or loss of consciousness  Medication Review  Dapagliflozin-metFORMIN HCl ER, Dulaglutide, EPINEPHrine, Magnesium, OneTouch Delica Lancets 57S, allopurinol, amLODipine, aspirin EC, atorvastatin, cetirizine, diphenhydrAMINE, ezetimibe, famotidine, glucose blood, hydrALAZINE, hydrocortisone, indomethacin, ketoconazole, metFORMIN, metoprolol tartrate, neomycin-polymyxin b-dexamethasone, nitroGLYCERIN, polyethylene glycol, and pregabalin  History Review  Allergy: John Hartman is allergic to imdur [isosorbide nitrate], lisinopril, other, drug [tape], tapentadol, and ace inhibitors. Drug: John Hartman  reports no history of drug use. Alcohol:  reports no history of alcohol use. Tobacco:  reports that he quit smoking about 19 years ago. His smoking use included cigarettes. He has a 10.00 pack-year smoking history. He has been exposed to tobacco smoke. He has never used smokeless tobacco. Social: John Hartman  reports that he quit smoking about 19 years ago. His smoking use included cigarettes. He has a 10.00 pack-year smoking history. He has been exposed to tobacco smoke. He has never used smokeless tobacco. He reports that he does not drink alcohol and does not use drugs. Medical:  has a past medical history of Angioedema, Bronchitis, DDD (degenerative disc disease), cervical, Diabetes mellitus ([place]), Fatty liver (03/31/2017), GERD (gastroesophageal reflux disease), Gout, Heart attack ([place]), HTN (hypertension), Hypertension, IBS (irritable bowel syndrome), Kidney stone, LVH (left ventricular hypertrophy) (11/24/2018), Myocardial infarction (John Hartman) (08/23/2018), Seasonal allergies, and  Spinal stenosis. Surgical: [name]  has a past surgical history that includes Shoulder surgery (Left, 2007); Nasal septum surgery (2004); ACDF with  fusion (2007); Testicular torsion (1980s); Vasectomy; Colonoscopy with propofol (N/A, 06/26/2015); Cardiac catheterization (Left, 08/15/2016); Coronary/Graft Acute MI Revascularization (N/A, 08/23/2018); LEFT HEART CATH AND CORONARY ANGIOGRAPHY (N/A, 08/23/2018); Cystoscopy with biopsy (N/A, 11/22/2019); Cystoscopy/ureteroscopy/holmium laser/stent placement (Left, 11/22/2019); Shoulder arthroscopy w/ rotator cuff repair (10/01/2019); LEFT HEART CATH AND CORONARY ANGIOGRAPHY (N/A, 02/10/2020); Cystoscopy/ureteroscopy/holmium laser (02/10/2020); Cystoscopy w/ retrogrades (02/10/2020); Extracorporeal shock wave lithotripsy (Left, 10/29/2020); Left Heart Cath (N/A, 11/04/2021); and CORONARY ANGIOGRAPHY (N/A, 11/04/2021). Family: family history includes Allergies in his son; Alzheimer's disease in his maternal grandmother; Arrhythmia in his mother; Breast cancer in his mother; Cancer in his maternal grandfather and mother; Diabetes in his father; Heart disease in his brother; Hypertension in his father; Skin cancer in his mother.  Laboratory Chemistry Profile   Renal Lab Results  Component Value Date   BUN 12 11/05/2021   CREATININE 0.91 20/23/3435   BCR NOT APPLICABLE 68/61/6837   GFRAA >60 06/17/2020   GFRNONAA >60 11/05/2021    Hepatic Lab Results  Component Value Date   AST 97 (H) 11/04/2021   ALT 125 (H) 11/04/2021   ALBUMIN 4.6 11/04/2021   ALKPHOS 75 11/04/2021   HCVAB NON REACTIVE 11/03/2021   LIPASE 45 11/05/2021    Electrolytes Lab Results  Component Value Date   NA 135 11/05/2021   K 3.8 11/05/2021   CL 103 11/05/2021   CALCIUM 8.5 (L) 11/05/2021   MG 2.2 11/05/2021   PHOS 3.3 11/05/2021    Bone Lab Results  Component Value Date   VD25OH 33.66 11/04/2021    Inflammation (CRP: Acute Phase) (ESR: Chronic Phase) Lab Results  Component  Value Date   LATICACIDVEN 1.5 10/29/2020         Note: Above Lab results reviewed.  Recent Imaging Review  DG Abd 1 View CLINICAL DATA:  Kidney stone.  EXAM: ABDOMEN - 1 VIEW  COMPARISON:  CT 11/03/2021  FINDINGS: No radiopaque calculi project over the renal beds or course of the ureters. Calcifications in the right greater than left pelvis correspond to phleboliths on prior CT. Moderate colonic stool burden without evidence of obstruction.  IMPRESSION: No radiopaque urinary tract calculi.  Electronically Signed   By: Keith Rake M.D.   On: 07/06/2022 17:05 Note: Reviewed         Assessment   Diagnosis   1. Lumbar radiculopathy         Plan of Care    Mr. AYSON CHERUBINI has a current medication list which includes the following long-term medication(s): allopurinol, amlodipine, ezetimibe, hydralazine, metoprolol tartrate, nitroglycerin, pregabalin, and [DISCONTINUED] metformin.  Pharmacotherapy (Medications Ordered): Meds ordered this encounter  Medications   DISCONTD: orphenadrine (NORFLEX) injection 30 mg   ketorolac (TORADOL) 30 MG/ML injection 30 mg   Follow-up plan:   Return if symptoms worsen or fail to improve.     Right T1-T2 ESI 07/21/20, 09/01/21; RIGHT GONB 09/01/21       Recent Visits No visits were found meeting these conditions. Showing recent visits within past 90 days and meeting all other requirements Today's Visits Date Type Provider Dept  07/07/22 Office Visit Gillis Santa, MD [facility]  Showing today's visits and meeting all other requirements Future Appointments No visits were found meeting these conditions. Showing future appointments within next 90 days and meeting all other requirements

## 2022-07-07 NOTE — Telephone Encounter (Signed)
Patient is having hip pain and wants to come in for toradol/norflex today. Says Dr. Holley Raring told him to call and he would get him in. Please advise

## 2022-07-18 ENCOUNTER — Telehealth: Payer: Self-pay | Admitting: Student in an Organized Health Care Education/Training Program

## 2022-07-18 NOTE — Telephone Encounter (Signed)
error 

## 2022-07-28 ENCOUNTER — Encounter: Payer: Self-pay | Admitting: Student in an Organized Health Care Education/Training Program

## 2022-07-28 ENCOUNTER — Ambulatory Visit
Payer: No Typology Code available for payment source | Attending: Student in an Organized Health Care Education/Training Program | Admitting: Student in an Organized Health Care Education/Training Program

## 2022-07-28 ENCOUNTER — Other Ambulatory Visit: Payer: Self-pay

## 2022-07-28 VITALS — BP 152/109 | HR 86 | Temp 92.2°F | Resp 18 | Ht 71.0 in | Wt 205.0 lb

## 2022-07-28 DIAGNOSIS — M5412 Radiculopathy, cervical region: Secondary | ICD-10-CM | POA: Diagnosis present

## 2022-07-28 DIAGNOSIS — M47816 Spondylosis without myelopathy or radiculopathy, lumbar region: Secondary | ICD-10-CM | POA: Insufficient documentation

## 2022-07-28 DIAGNOSIS — M5136 Other intervertebral disc degeneration, lumbar region: Secondary | ICD-10-CM | POA: Diagnosis present

## 2022-07-28 DIAGNOSIS — M5416 Radiculopathy, lumbar region: Secondary | ICD-10-CM | POA: Diagnosis present

## 2022-07-28 DIAGNOSIS — G894 Chronic pain syndrome: Secondary | ICD-10-CM | POA: Insufficient documentation

## 2022-07-28 MED ORDER — HYDROCODONE-ACETAMINOPHEN 10-325 MG PO TABS: 1.0000 | ORAL_TABLET | Freq: Three times a day (TID) | ORAL | 0 refills | Status: AC | PRN

## 2022-07-28 MED ORDER — KETOROLAC TROMETHAMINE 30 MG/ML IJ SOLN
INTRAMUSCULAR | Status: AC
  Filled 2022-07-28: qty 1

## 2022-07-28 MED ORDER — KETOROLAC TROMETHAMINE 30 MG/ML IJ SOLN
30.0000 mg | Freq: Once | INTRAMUSCULAR | Status: AC
  Administered 2022-07-28: 30 mg via INTRAMUSCULAR

## 2022-07-28 MED ORDER — PREGABALIN 50 MG PO CAPS: ORAL_CAPSULE | ORAL | 5 refills | Status: AC

## 2022-07-28 MED ORDER — TIZANIDINE HCL 4 MG PO TABS: 4.0000 mg | ORAL_TABLET | Freq: Three times a day (TID) | ORAL | 0 refills | Status: AC | PRN

## 2022-07-28 NOTE — Progress Notes (Signed)
PROVIDER NOTE: Information contained herein reflects review and annotations entered in association with encounter. Interpretation of such information and data should be left to medically-trained personnel. Information provided to patient can be located elsewhere in the medical record under "Patient Instructions". Document created using STT-dictation technology, any transcriptional errors that may result from process are unintentional.    Patient: John Hartman  Service Category: E/M  Provider: Gillis Santa, MD  DOB: 1971-11-26  DOS: 07/28/2022  Referring Provider: Juanda Bond, NP  MRN: [id number]  Specialty: Interventional Pain Management  PCP: Juanda Bond, NP  Type: Established Patient  Setting: Ambulatory outpatient    Location: Office  Delivery: Face-to-face     HPI  [name], a 51 y.o. year old male, is here today because of his Lumbar radiculopathy [M54.16]. Mr. Heidenreich primary complain today is Back Pain and Shoulder Pain (Mid and right, located between shoulder blade and spine) Last encounter: My last encounter with him was on 07/18/2022. Pertinent problems: Mr. Pursley has Coronary artery disease; Chronic lower back pain; Nephrolithiasis; Chronic pain syndrome; Lumbar radiculopathy; Spinal stenosis of lumbar region with neurogenic claudication; S/P cervical spinal fusion; and Back muscle spasm on their pertinent problem list. Pain Assessment: Severity of Chronic pain is reported as a 9 /10. Location: Back Mid/right shoulder. Onset: 1 to 4 weeks ago. Quality: Sharp, Pressure. Timing: Constant. Modifying factor(s): nothing. Vitals:  height is '5\' 11"'  (1.803 m) and weight is 205 lb (93 kg). His temporal temperature is 92.2 F (33.4 C) (abnormal). His blood pressure is 152/109 (abnormal) and his pulse is 86. His respiration is 18 and oxygen saturation is 100%.   Reason for encounter: evaluation of worsening, or previously known (established) problem.   Patient is having increased lower  back pain with radiation into his left greater than right leg.  He is also having mid shoulder pain.  He states that he has an appointment with a surgeon next week.  He does have a symptomatic L4-L5 broad-based disc protrusion with tiny annular tear.  There is moderate facet arthropathy and ligamentum flavum hypertrophy.  There is moderate canal stenosis impacting the L5 nerve roots with potential impingement and marginal contact of the L4 nerve root.  He states that his surgeon has previously told him that he will need surgery.  Patient states that over the last couple of weeks, he has had difficulty working, completing ADLs and functioning given his increased pain.  He is requesting pain management until he is able to establish with surgeon and develop a surgical plan.  Of note his last prescription for opioid analgesics was 07/19/2021 when he was also experiencing a pain flare at that time.  Pharmacotherapy Assessment  Analgesic: Starting hydrocodone 10 mg 3 times daily as needed  Monitoring:  PMP: PDMP reviewed during this encounter.       Pharmacotherapy: No side-effects or adverse reactions reported. Compliance: No problems identified. Effectiveness: Clinically acceptable.  Hart Rochester, RN  07/28/2022  2:16 PM  Signed Safety precautions to be maintained throughout the outpatient stay will include: orient to surroundings, keep bed in low position, maintain call bell within reach at all times, provide assistance with transfer out of bed and ambulation.     UDS:  Summary  Date Value Ref Range Status  07/20/2021 Note  Final    Comment:    ==================================================================== Compliance Drug Analysis, Ur ==================================================================== Test  Result       Flag       Units  Drug Present and Declared for Prescription Verification   Pregabalin                     PRESENT      EXPECTED    Acetaminophen                  PRESENT      EXPECTED   Diphenhydramine                PRESENT      EXPECTED   Metoprolol                     PRESENT      EXPECTED  Drug Present not Declared for Prescription Verification   Hydrocodone                    1136         UNEXPECTED ng/mg creat   Hydromorphone                  176          UNEXPECTED ng/mg creat   Dihydrocodeine                 200          UNEXPECTED ng/mg creat   Norhydrocodone                 1043         UNEXPECTED ng/mg creat    Sources of hydrocodone include scheduled prescription medications.    Hydromorphone, dihydrocodeine and norhydrocodone are expected    metabolites of hydrocodone. Hydromorphone and dihydrocodeine are    also available as scheduled prescription medications.  Drug Absent but Declared for Prescription Verification   Oxycodone                      Not Detected UNEXPECTED ng/mg creat   Salicylate                     Not Detected UNEXPECTED    Aspirin, as indicated in the declared medication list, is not always    detected even when used as directed.  ==================================================================== Test                      Result    Flag   Units      Ref Range   Creatinine              58               mg/dL      >=20 ==================================================================== Declared Medications:  The flagging and interpretation on this report are based on the  following declared medications.  Unexpected results may arise from  inaccuracies in the declared medications.   **Note: The testing scope of this panel includes these medications:   Diphenhydramine (Benadryl)  Metoprolol (Lopressor)  Oxycodone (Percocet)  Pregabalin (Lyrica)   **Note: The testing scope of this panel does not include small to  moderate amounts of these reported medications:   Acetaminophen (Percocet)  Aspirin   **Note: The testing scope of this panel does not include the  following  reported medications:   Allopurinol (Zyloprim)  Amlodipine (Norvasc)  Cetirizine (Zyrtec)  Dapagliflozin (Xigduo)  Epinephrine  Ezetimibe (Zetia)  Famotidine (Pepcid)  Hydralazine (Apresoline)  Indomethacin (Indocin)  Magnesium  Metformin (Xigduo)  Metformin (Glucophage)  Nitroglycerin (Nitrostat)  Semaglutide (Rybelsus) ==================================================================== For clinical consultation, please call [phone]. ====================================================================      ROS  Constitutional: Denies any fever or chills Gastrointestinal: Denies Musculoskeletal:  Periscapular pain, low back pain with radiation into lower extremity Neurological:  Paresthesias of bilateral lower extremity  Medication Review  Dapagliflozin-metFORMIN HCl ER, Dulaglutide, EPINEPHrine, HYDROcodone-acetaminophen, Magnesium, OneTouch Delica Lancets 76P, allopurinol, amLODipine, aspirin EC, atorvastatin, cetirizine, diphenhydrAMINE, ezetimibe, famotidine, glucose blood, hydrALAZINE, hydrocortisone, indomethacin, ketoconazole, metFORMIN, metoprolol tartrate, neomycin-polymyxin b-dexamethasone, nitroGLYCERIN, polyethylene glycol, pregabalin, and tiZANidine  History Review  Allergy: Mr. Delduca is allergic to imdur [isosorbide nitrate], lisinopril, other, drug [tape], tapentadol, and ace inhibitors. Drug: Mr. Trefz  reports no history of drug use. Alcohol:  reports no history of alcohol use. Tobacco:  reports that he quit smoking about 19 years ago. His smoking use included cigarettes. He has a 10.00 pack-year smoking history. He has been exposed to tobacco smoke. He has never used smokeless tobacco. Social: Mr. Ragle  reports that he quit smoking about 19 years ago. His smoking use included cigarettes. He has a 10.00 pack-year smoking history. He has been exposed to tobacco smoke. He has never used smokeless tobacco. He reports that he does not drink alcohol and does  not use drugs. Medical:  has a past medical history of Angioedema, Bronchitis, DDD (degenerative disc disease), cervical, Diabetes mellitus ([place]), Fatty liver (03/31/2017), GERD (gastroesophageal reflux disease), Gout, Heart attack ([place]), HTN (hypertension), Hypertension, IBS (irritable bowel syndrome), Kidney stone, LVH (left ventricular hypertrophy) (11/24/2018), Myocardial infarction ([place]) (08/23/2018), Seasonal allergies, and Spinal stenosis. Surgical: Mr. Pitstick  has a past surgical history that includes Shoulder surgery (Left, 2007); Nasal septum surgery (2004); ACDF with fusion (2007); Testicular torsion (1980s); Vasectomy; Colonoscopy with propofol (N/A, 06/26/2015); Cardiac catheterization (Left, 08/15/2016); Coronary/Graft Acute MI Revascularization (N/A, 08/23/2018); LEFT HEART CATH AND CORONARY ANGIOGRAPHY (N/A, 08/23/2018); Cystoscopy with biopsy (N/A, 11/22/2019); Cystoscopy/ureteroscopy/holmium laser/stent placement (Left, 11/22/2019); Shoulder arthroscopy w/ rotator cuff repair (10/01/2019); LEFT HEART CATH AND CORONARY ANGIOGRAPHY (N/A, 02/10/2020); Cystoscopy/ureteroscopy/holmium laser (02/10/2020); Cystoscopy w/ retrogrades (02/10/2020); Extracorporeal shock wave lithotripsy (Left, 10/29/2020); Left Heart Cath (N/A, 11/04/2021); and CORONARY ANGIOGRAPHY (N/A, 11/04/2021). Family: family history includes Allergies in his son; Alzheimer's disease in his maternal grandmother; Arrhythmia in his mother; Breast cancer in his mother; Cancer in his maternal grandfather and mother; Diabetes in his father; Heart disease in his brother; Hypertension in his father; Skin cancer in his mother.  Laboratory Chemistry Profile   Renal Lab Results  Component Value Date   BUN 12 11/05/2021   CREATININE 0.91 95/08/3266   BCR NOT APPLICABLE 12/45/8099   GFRAA >60 06/17/2020   GFRNONAA >60 11/05/2021    Hepatic Lab Results  Component Value Date   AST 97 (H) 11/04/2021   ALT 125 (H) 11/04/2021   ALBUMIN 4.6  11/04/2021   ALKPHOS 75 11/04/2021   HCVAB NON REACTIVE 11/03/2021   LIPASE 45 11/05/2021    Electrolytes Lab Results  Component Value Date   NA 135 11/05/2021   K 3.8 11/05/2021   CL 103 11/05/2021   CALCIUM 8.5 (L) 11/05/2021   MG 2.2 11/05/2021   PHOS 3.3 11/05/2021    Bone Lab Results  Component Value Date   VD25OH 33.66 11/04/2021    Inflammation (CRP: Acute Phase) (ESR: Chronic Phase) Lab Results  Component Value Date   LATICACIDVEN 1.5 10/29/2020         Note: Above Lab results reviewed.  Recent  Imaging Review  DG Abd 1 View CLINICAL DATA:  Kidney stone.  EXAM: ABDOMEN - 1 VIEW  COMPARISON:  CT 11/03/2021  FINDINGS: No radiopaque calculi project over the renal beds or course of the ureters. Calcifications in the right greater than left pelvis correspond to phleboliths on prior CT. Moderate colonic stool burden without evidence of obstruction.  IMPRESSION: No radiopaque urinary tract calculi.  Electronically Signed   By: Keith Rake M.D.   On: 07/06/2022 17:05 Note: Reviewed        Physical Exam  General appearance: Well nourished, well developed, and well hydrated. In no apparent acute distress Mental status: Alert, oriented x 3 (person, place, & time)       Respiratory: No evidence of acute respiratory distress Eyes: PERLA Vitals: BP (!) 152/109   Pulse 86   Temp (!) 92.2 F (33.4 C) (Temporal)   Resp 18   Ht '5\' 11"'  (1.803 m)   Wt 205 lb (93 kg)   SpO2 100%   BMI 28.59 kg/m  BMI: Estimated body mass index is 28.59 kg/m as calculated from the following:   Height as of this encounter: '5\' 11"'  (1.803 m).   Weight as of this encounter: 205 lb (93 kg). Ideal: Ideal body weight: 75.3 kg (166 lb 0.1 oz) Adjusted ideal body weight: 82.4 kg (181 lb 9.7 oz)  Back Exam   Tenderness  The patient is experiencing tenderness in the cervical and lumbar.  Range of Motion  Extension:  abnormal  Flexion:  abnormal   Muscle Strength  Right  Quadriceps:  5/5  Left Quadriceps:  5/5  Right Hamstrings:  5/5  Left Hamstrings:  5/5   Tests  Straight leg raise right: positive Straight leg raise left: positive  Other  Gait: normal   Comments:  Severe pain with lumbar extension and flexion.  Also has radiating pain down to his leg.       Assessment   Diagnosis Status  1. Lumbar radiculopathy   2. Lumbar facet arthropathy   3. Degenerative disc disease, lumbar   4. Cervical radicular pain   5. Chronic pain syndrome    Having a Flare-up Having a Flare-up Persistent     Plan of Care    Mr. KARMAN VENEY has a current medication list which includes the following long-term medication(s): allopurinol, amlodipine, ezetimibe, hydralazine, metoprolol tartrate, nitroglycerin, pregabalin, and [DISCONTINUED] metformin.  Pharmacotherapy (Medications Ordered): Meds ordered this encounter  Medications   tiZANidine (ZANAFLEX) 4 MG tablet    Sig: Take 1 tablet (4 mg total) by mouth every 8 (eight) hours as needed for muscle spasms.    Dispense:  90 tablet    Refill:  0    Do not place this medication, or any other prescription from our practice, on "Automatic Refill". Patient may have prescription filled one day early if pharmacy is closed on scheduled refill date.   HYDROcodone-acetaminophen (NORCO) 10-325 MG tablet    Sig: Take 1 tablet by mouth every 8 (eight) hours as needed for severe pain. Must last 30 days.    Dispense:  90 tablet    Refill:  0    Chronic Pain: STOP Act (Not applicable) Fill 1 day early if closed on refill date. Avoid benzodiazepines within 8 hours of opioids   pregabalin (LYRICA) 50 MG capsule    Sig: 1 capsule in the morning and 2 capsules at night.    Dispense:  90 capsule    Refill:  5  ketorolac (TORADOL) 30 MG/ML injection 30 mg   Orders:  No orders of the defined types were placed in this encounter.  Follow-up plan:   Return in about 4 weeks (around 08/25/2022) for Medication Management,  in person.     Right T1-T2 ESI 07/21/20, 09/01/21; RIGHT GONB 09/01/21        Recent Visits Date Type Provider Dept  07/07/22 Office Visit Gillis Santa, MD [facility]  Showing recent visits within past 90 days and meeting all other requirements Today's Visits Date Type Provider Dept  07/28/22 Office Visit Gillis Santa, MD [facility]  Showing today's visits and meeting all other requirements Future Appointments Date Type Provider Dept  08/30/22 Appointment Gillis Santa, MD [facility]  Showing future appointments within next 90 days and meeting all other requirements  I discussed the assessment and treatment plan with the patient. The patient was provided an opportunity to ask questions and all were answered. The patient agreed with the plan and demonstrated an understanding of the instructions.  Patient advised to call back or seek an in-person evaluation if the symptoms or condition worsens.  Duration of encounter: 43mnutes.  Total time on encounter, as per AMA guidelines included both the face-to-face and non-face-to-face time personally spent by the physician and/or other qualified health care professional(s) on the day of the encounter (includes time in activities that require the physician or other qualified health care professional and does not include time in activities normally performed by clinical staff). Physician's time may include the following activities when performed: preparing to see the patient (eg, review of tests, pre-charting review of records) obtaining and/or reviewing separately obtained history performing a medically appropriate examination and/or evaluation counseling and educating the patient/family/caregiver ordering medications, tests, or procedures referring and communicating with other health care professionals (when not separately reported) documenting clinical information in the electronic or other health  record independently interpreting results (not separately reported) and communicating results to the patient/ family/caregiver care coordination (not separately reported)  Note by: BGillis Santa MD Date: 07/28/2022; Time: 3:22 PM

## 2022-07-28 NOTE — Progress Notes (Signed)
Safety precautions to be maintained throughout the outpatient stay will include: orient to surroundings, keep bed in low position, maintain call bell within reach at all times, provide assistance with transfer out of bed and ambulation.  

## 2022-08-19 ENCOUNTER — Other Ambulatory Visit: Payer: Self-pay | Admitting: Student in an Organized Health Care Education/Training Program

## 2022-08-19 DIAGNOSIS — M47816 Spondylosis without myelopathy or radiculopathy, lumbar region: Secondary | ICD-10-CM

## 2022-08-19 DIAGNOSIS — G894 Chronic pain syndrome: Secondary | ICD-10-CM

## 2022-08-19 DIAGNOSIS — M5416 Radiculopathy, lumbar region: Secondary | ICD-10-CM

## 2022-08-30 ENCOUNTER — Ambulatory Visit
Payer: No Typology Code available for payment source | Attending: Student in an Organized Health Care Education/Training Program | Admitting: Student in an Organized Health Care Education/Training Program

## 2022-08-30 ENCOUNTER — Encounter: Payer: Self-pay | Admitting: Student in an Organized Health Care Education/Training Program

## 2022-08-30 VITALS — BP 134/96 | HR 93 | Temp 97.2°F | Resp 17 | Ht 71.0 in | Wt 210.0 lb

## 2022-08-30 DIAGNOSIS — M5416 Radiculopathy, lumbar region: Secondary | ICD-10-CM | POA: Diagnosis not present

## 2022-08-30 DIAGNOSIS — G894 Chronic pain syndrome: Secondary | ICD-10-CM | POA: Diagnosis not present

## 2022-08-30 DIAGNOSIS — M47816 Spondylosis without myelopathy or radiculopathy, lumbar region: Secondary | ICD-10-CM | POA: Diagnosis not present

## 2022-08-30 DIAGNOSIS — M5136 Other intervertebral disc degeneration, lumbar region: Secondary | ICD-10-CM | POA: Insufficient documentation

## 2022-08-30 MED ORDER — HYDROCODONE-ACETAMINOPHEN 10-325 MG PO TABS: 1.0000 | ORAL_TABLET | Freq: Three times a day (TID) | ORAL | 0 refills | Status: AC | PRN

## 2022-08-30 NOTE — Progress Notes (Signed)
PROVIDER NOTE: Information contained herein reflects review and annotations entered in association with encounter. Interpretation of such information and data should be left to medically-trained personnel. Information provided to patient can be located elsewhere in the medical record under "Patient Instructions". Document created using STT-dictation technology, any transcriptional errors that may result from process are unintentional.    Patient: John Hartman  Service Category: E/M  Provider: Gillis Santa, MD  DOB: Apr 06, 1971  DOS: 08/30/2022  Referring Provider: Juanda Bond, NP  MRN: [id number]  Specialty: Interventional Pain Management  PCP: Juanda Bond, NP  Type: Established Patient  Setting: Ambulatory outpatient    Location: Office  Delivery: Face-to-face     HPI  [name], a 51 y.o. year old male, is here today because of his Lumbar radiculopathy [M54.16]. [name] primary complain today is Back Pain Last encounter: My last encounter with him was on 07/28/22 Pertinent problems: [name] has Coronary artery disease; Chronic lower back pain; Nephrolithiasis; Chronic pain syndrome; Lumbar radiculopathy; Spinal stenosis of lumbar region with neurogenic claudication; S/P cervical spinal fusion; and Back muscle spasm on their pertinent problem list. Pain Assessment: Severity of Chronic pain is reported as a 7 /10. Location: Back Lower/through left hip to bottom of left foot. Onset: More than a month ago. Quality: Shooting, Numbness, Tingling. Timing: Constant. Modifying factor(s): nothing. Vitals:  height is _0  (1.803 m) and weight is 210 lb (95.3 kg). His temporal temperature is 97.2 F (36.2 C) (abnormal). His blood pressure is 134/96 (abnormal) and his pulse is 93. His respiration is 17 and oxygen saturation is 96%.   Reason for encounter: evaluation of worsening, or previously known (established) problem.   Patient continues to have severe low back pain with radiation into  his left leg related to L4-L5 radiculopathy.  He is scheduled for a L4-L5 decompression with Dr. Erasmo Leventhal 09/07/2022. I have provided him with a surgeons note that explains perioperative opioid prescribing. I will send in a prescription for hydrocodone today. I will see the patient back at the end of October once he has followed up with his neurosurgeon to see how he is doing.   Pharmacotherapy Assessment  Analgesic:hydrocodone 10 mg 3 times daily as needed  Monitoring: [place] PMP: PDMP reviewed during this encounter.       Pharmacotherapy: No side-effects or adverse reactions reported. Compliance: No problems identified. Effectiveness: Clinically acceptable.  Rise Patience, RN  08/30/2022  2:44 PM  Sign when Signing Visit Nursing Pain Medication Assessment:  Safety precautions to be maintained throughout the outpatient stay will include: orient to surroundings, keep bed in low position, maintain call bell within reach at all times, provide assistance with transfer out of bed and ambulation.  Medication Inspection Compliance: [name] did not comply with our request to bring his pills to be counted. He was reminded that bringing the medication bottles, even when empty, is a requirement.  Medication: None brought in. Pill/Patch Count: None available to be counted. Bottle Appearance: No container available. Did not bring bottle(s) to appointment. Filled Date: N/A Last Medication intake:  Today    UDS:  Summary  Date Value Ref Range Status  07/20/2021 Note  Final    Comment:    ==================================================================== Compliance Drug Analysis, Ur ==================================================================== Test                             Result       Flag  Units  Drug Present and Declared for Prescription Verification   Pregabalin                     PRESENT      EXPECTED   Acetaminophen                  PRESENT      EXPECTED   Diphenhydramine                 PRESENT      EXPECTED   Metoprolol                     PRESENT      EXPECTED  Drug Present not Declared for Prescription Verification   Hydrocodone                    1136         UNEXPECTED ng/mg creat   Hydromorphone                  176          UNEXPECTED ng/mg creat   Dihydrocodeine                 200          UNEXPECTED ng/mg creat   Norhydrocodone                 1043         UNEXPECTED ng/mg creat    Sources of hydrocodone include scheduled prescription medications.    Hydromorphone, dihydrocodeine and norhydrocodone are expected    metabolites of hydrocodone. Hydromorphone and dihydrocodeine are    also available as scheduled prescription medications.  Drug Absent but Declared for Prescription Verification   Oxycodone                      Not Detected UNEXPECTED ng/mg creat   Salicylate                     Not Detected UNEXPECTED    Aspirin, as indicated in the declared medication list, is not always    detected even when used as directed.  ==================================================================== Test                      Result    Flag   Units      Ref Range   Creatinine              58               mg/dL      >=20 ==================================================================== Declared Medications:  The flagging and interpretation on this report are based on the  following declared medications.  Unexpected results may arise from  inaccuracies in the declared medications.   **Note: The testing scope of this panel includes these medications:   Diphenhydramine (Benadryl)  Metoprolol (Lopressor)  Oxycodone (Percocet)  Pregabalin (Lyrica)   **Note: The testing scope of this panel does not include small to  moderate amounts of these reported medications:   Acetaminophen (Percocet)  Aspirin   **Note: The testing scope of this panel does not include the  following reported medications:   Allopurinol (Zyloprim)  Amlodipine (Norvasc)   Cetirizine (Zyrtec)  Dapagliflozin (Xigduo)  Epinephrine  Ezetimibe (Zetia)  Famotidine (Pepcid)  Hydralazine (Apresoline)  Indomethacin (Indocin)  Magnesium  Metformin (Xigduo)  Metformin (Glucophage)  Nitroglycerin (Nitrostat)  Semaglutide (Rybelsus) ==================================================================== For clinical consultation, please call [phone]. ====================================================================      ROS  Constitutional: Denies any fever or chills Gastrointestinal: Denies Musculoskeletal:  low back pain with radiation into left lower extremity Neurological:  Paresthesias of left lower extremity  Medication Review  Dapagliflozin-metFORMIN HCl ER, Dulaglutide, EPINEPHrine, HYDROcodone-acetaminophen, Magnesium, OneTouch Delica Lancets 02H, allopurinol, amLODipine, aspirin EC, atorvastatin, cetirizine, diphenhydrAMINE, ezetimibe, famotidine, glucose blood, hydrALAZINE, hydrocortisone, indomethacin, ketoconazole, metFORMIN, metoprolol tartrate, neomycin-polymyxin b-dexamethasone, nitroGLYCERIN, polyethylene glycol, and pregabalin  History Review  Allergy: [name] is allergic to imdur [isosorbide nitrate], lisinopril, other, drug [tape], tapentadol, and ace inhibitors. Drug: [name]  reports no history of drug use. Alcohol:  reports no history of alcohol use. Tobacco:  reports that he quit smoking about 19 years ago. His smoking use included cigarettes. He has a 10.00 pack-year smoking history. He has been exposed to tobacco smoke. He has never used smokeless tobacco. Social: [name]  reports that he quit smoking about 19 years ago. His smoking use included cigarettes. He has a 10.00 pack-year smoking history. He has been exposed to tobacco smoke. He has never used smokeless tobacco. He reports that he does not drink alcohol and does not use drugs. Medical:  has a past medical history of Angioedema, Bronchitis, DDD (degenerative disc  disease), cervical, Diabetes mellitus ([place]), Fatty liver (03/31/2017), GERD (gastroesophageal reflux disease), Gout, Heart attack ([place]), HTN (hypertension), Hypertension, IBS (irritable bowel syndrome), Kidney stone, LVH (left ventricular hypertrophy) (11/24/2018), Myocardial infarction ([place]) (08/23/2018), Seasonal allergies, and Spinal stenosis. Surgical: [name]  has a past surgical history that includes Shoulder surgery (Left, 2007); Nasal septum surgery (2004); ACDF with fusion (2007); Testicular torsion (1980s); Vasectomy; Colonoscopy with propofol (N/A, 06/26/2015); Cardiac catheterization (Left, 08/15/2016); Coronary/Graft Acute MI Revascularization (N/A, 08/23/2018); LEFT HEART CATH AND CORONARY ANGIOGRAPHY (N/A, 08/23/2018); Cystoscopy with biopsy (N/A, 11/22/2019); Cystoscopy/ureteroscopy/holmium laser/stent placement (Left, 11/22/2019); Shoulder arthroscopy w/ rotator cuff repair (10/01/2019); LEFT HEART CATH AND CORONARY ANGIOGRAPHY (N/A, 02/10/2020); Cystoscopy/ureteroscopy/holmium laser (02/10/2020); Cystoscopy w/ retrogrades (02/10/2020); Extracorporeal shock wave lithotripsy (Left, 10/29/2020); Left Heart Cath (N/A, 11/04/2021); and CORONARY ANGIOGRAPHY (N/A, 11/04/2021). Family: family history includes Allergies in his son; Alzheimer's disease in his maternal grandmother; Arrhythmia in his mother; Breast cancer in his mother; Cancer in his maternal grandfather and mother; Diabetes in his father; Heart disease in his brother; Hypertension in his father; Skin cancer in his mother.  Laboratory Chemistry Profile   Renal Lab Results  Component Value Date   BUN 12 11/05/2021   CREATININE 0.91 85/27/7824   BCR NOT APPLICABLE 23/53/6144   GFRAA >60 06/17/2020   GFRNONAA >60 11/05/2021    Hepatic Lab Results  Component Value Date   AST 97 (H) 11/04/2021   ALT 125 (H) 11/04/2021   ALBUMIN 4.6 11/04/2021   ALKPHOS 75 11/04/2021   HCVAB NON REACTIVE 11/03/2021   LIPASE 45 11/05/2021     Electrolytes Lab Results  Component Value Date   NA 135 11/05/2021   K 3.8 11/05/2021   CL 103 11/05/2021   CALCIUM 8.5 (L) 11/05/2021   MG 2.2 11/05/2021   PHOS 3.3 11/05/2021    Bone Lab Results  Component Value Date   VD25OH 33.66 11/04/2021    Inflammation (CRP: Acute Phase) (ESR: Chronic Phase) Lab Results  Component Value Date   LATICACIDVEN 1.5 10/29/2020         Note: Above Lab results reviewed.  Recent Imaging Review  DG Abd 1 View CLINICAL DATA:  Kidney stone.  EXAM: ABDOMEN -  1 VIEW  COMPARISON:  CT 11/03/2021  FINDINGS: No radiopaque calculi project over the renal beds or course of the ureters. Calcifications in the right greater than left pelvis correspond to phleboliths on prior CT. Moderate colonic stool burden without evidence of obstruction.  IMPRESSION: No radiopaque urinary tract calculi.  Electronically Signed   By: Keith Rake M.D.   On: 07/06/2022 17:05 Note: Reviewed        Physical Exam  General appearance: Well nourished, well developed, and well hydrated. In no apparent acute distress Mental status: Alert, oriented x 3 (person, place, & time)       Respiratory: No evidence of acute respiratory distress Eyes: PERLA Vitals: BP (!) 134/96   Pulse 93   Temp (!) 97.2 F (36.2 C) (Temporal)   Resp 17   Ht _0  (1.803 m)   Wt 210 lb (95.3 kg)   SpO2 96%   BMI 29.29 kg/m  BMI: Estimated body mass index is 29.29 kg/m as calculated from the following:   Height as of this encounter: _1  (1.803 m).   Weight as of this encounter: 210 lb (95.3 kg). Ideal: Ideal body weight: 75.3 kg (166 lb 0.1 oz) Adjusted ideal body weight: 83.3 kg (183 lb 9.7 oz)  Back Exam   Tenderness  The patient is experiencing tenderness in the cervical and lumbar.  Range of Motion  Extension:  abnormal  Flexion:  abnormal   Muscle Strength  Right Quadriceps:  5/5  Left Quadriceps:  5/5  Right Hamstrings:  5/5  Left Hamstrings:  5/5    Tests  Straight leg raise right: positive Straight leg raise left: positive  Other  Gait: normal   Comments:  Severe pain with lumbar extension and flexion.  Also has radiating pain down to his leg.       Assessment   Diagnosis Status  1. Lumbar radiculopathy   2. Lumbar facet arthropathy   3. Degenerative disc disease, lumbar   4. Chronic pain syndrome    Having a Flare-up Having a Flare-up Persistent     Plan of Care    [name] has a current medication list which includes the following long-term medication(s): allopurinol, amlodipine, ezetimibe, hydralazine, metoprolol tartrate, nitroglycerin, pregabalin, and [DISCONTINUED] metformin.  Pharmacotherapy (Medications Ordered): Meds ordered this encounter  Medications   HYDROcodone-acetaminophen (NORCO) 10-325 MG tablet    Sig: Take 1 tablet by mouth every 8 (eight) hours as needed.    Dispense:  90 tablet    Refill:  0   Follow-up after surgery.  Follow-up plan:   Return in about 7 weeks (around 10/18/2022) for Medication Management, in person.     Right T1-T2 ESI 07/21/20, 09/01/21; RIGHT GONB 09/01/21        Recent Visits Date Type Provider Dept  07/28/22 Office Visit Gillis Santa, MD [facility]  07/07/22 Office Visit Gillis Santa, MD [facility]  Showing recent visits within past 90 days and meeting all other requirements Today's Visits Date Type Provider Dept  08/30/22 Office Visit Gillis Santa, MD [facility]  Showing today's visits and meeting all other requirements Future Appointments Date Type Provider Dept  10/18/22 Appointment Gillis Santa, MD [facility]  Showing future appointments within next 90 days and meeting all other requirements  I discussed the assessment and treatment plan with the patient. The patient was provided an opportunity to ask questions and all were answered. The patient agreed with the plan and demonstrated  an  understanding of the instructions.  Patient advised to call back or seek an in-person evaluation if the symptoms or condition worsens.  Duration of encounter: 17mnutes.  Total time on encounter, as per AMA guidelines included both the face-to-face and non-face-to-face time personally spent by the physician and/or other qualified health care professional(s) on the day of the encounter (includes time in activities that require the physician or other qualified health care professional and does not include time in activities normally performed by clinical staff). Physician's time may include the following activities when performed: preparing to see the patient (eg, review of tests, pre-charting review of records) obtaining and/or reviewing separately obtained history performing a medically appropriate examination and/or evaluation counseling and educating the patient/family/caregiver ordering medications, tests, or procedures referring and communicating with other health care professionals (when not separately reported) documenting clinical information in the electronic or other health record independently interpreting results (not separately reported) and communicating results to the patient/ family/caregiver care coordination (not separately reported)  Note by: BGillis Santa MD Date: 08/30/2022; Time: 3:06 PM

## 2022-08-30 NOTE — Progress Notes (Signed)
Nursing Pain Medication Assessment:  Safety precautions to be maintained throughout the outpatient stay will include: orient to surroundings, keep bed in low position, maintain call bell within reach at all times, provide assistance with transfer out of bed and ambulation.  Medication Inspection Compliance: John Hartman did not comply with our request to bring his pills to be counted. He was reminded that bringing the medication bottles, even when empty, is a requirement.  Medication: None brought in. Pill/Patch Count: None available to be counted. Bottle Appearance: No container available. Did not bring bottle(s) to appointment. Filled Date: N/A Last Medication intake:  Today

## 2022-10-04 ENCOUNTER — Other Ambulatory Visit
Admission: RE | Admit: 2022-10-04 | Discharge: 2022-10-04 | Disposition: A | Discharge status: Home / Self Care | Payer: No Typology Code available for payment source | Attending: Urology | Admitting: Urology

## 2022-10-04 ENCOUNTER — Ambulatory Visit (INDEPENDENT_AMBULATORY_CARE_PROVIDER_SITE_OTHER): Payer: No Typology Code available for payment source | Admitting: Urology

## 2022-10-04 ENCOUNTER — Other Ambulatory Visit: Payer: Self-pay | Admitting: *Deleted

## 2022-10-04 ENCOUNTER — Encounter: Payer: Self-pay | Admitting: Urology

## 2022-10-04 VITALS — BP 123/88 | HR 85 | Ht 71.0 in | Wt 213.0 lb

## 2022-10-04 DIAGNOSIS — Z87440 Personal history of urinary (tract) infections: Secondary | ICD-10-CM | POA: Diagnosis not present

## 2022-10-04 DIAGNOSIS — N200 Calculus of kidney: Secondary | ICD-10-CM

## 2022-10-04 DIAGNOSIS — R310 Gross hematuria: Secondary | ICD-10-CM

## 2022-10-04 DIAGNOSIS — R319 Hematuria, unspecified: Secondary | ICD-10-CM

## 2022-10-04 LAB — URINALYSIS, COMPLETE (UACMP) WITH MICROSCOPIC
Bilirubin Urine: NEGATIVE
Glucose, UA: >1000 mg/dL — AB
Ketones, ur: NEGATIVE mg/dL
Leukocytes,Ua: NEGATIVE
Nitrite: NEGATIVE
Protein, ur: NEGATIVE mg/dL
RBC / HPF: >50 RBC/hpf (ref 0–5)
Specific Gravity, Urine: 1.015 (ref 1.005–1.030)
pH: 7 (ref 5.0–8.0)

## 2022-10-04 NOTE — Patient Instructions (Signed)
Will call with Urine results to determine next step

## 2022-10-04 NOTE — Progress Notes (Signed)
   10/04/2022 2:13 PM   John Hartman 1971/11/28 [id number]  Reason for visit: New gross hematuria, history of nephrolithiasis  HPI: 51 year old male with extensive cardiac history as well as history of recurrent nephrolithiasis.  He has not tolerated ureteral stents well in the past, but did well with ureteroscopy when a stent was not placed.  He also has a history of left-sided shockwave lithotripsy for a 6 mm nonobstructing left lower pole stone with Dr. Erlene Quan in October 3818 which was complicated by 7 cm left renal hematoma that required admission for pain and nausea control, but no transfusion.  Blood pressures have been normal since that time.  I saw him recently in July 2023 when he was doing well with no urologic complaints, and KUB showed no evidence of stones.  He also had a CT abdomen and pelvis with contrast from November 2022 that showed no evidence of stones, hydronephrosis, or other urologic abnormalities.  I personally viewed and interpreted those images again today.  He is seen today for gross hematuria.  He had back surgery about a month ago and has been on narcotics, NSAIDs, and Bactrim.  He noticed over the weekend some pink urine and potential small clots, this correlated with him increasing his physical activity after essentially bedrest over the last few weeks.  He denies any dysuria, flank pain, or other urinary symptoms.  Urine has been clear the last 48 hours.  We reviewed the AUA guidelines regarding evaluation and management of patients with gross hematuria, and I recommended considering CT urogram and cystoscopy.  Urinalysis today is pending.  Using shared decision making, he was amenable to pursuing CT urogram if microscopic hematuria on urinalysis today, but if urinalysis today benign would prefer to repeat UA in 1 to 2 months to confirm normal.  He would like to avoid cystoscopy if possible.  Follow-up urinalysis-if microscopic hematuria will order CT urogram, if UA  benign RTC 3 months with repeat UA  Billey Co, MD  [facility] [address], [place], [place] [address] [phone]

## 2022-10-10 ENCOUNTER — Other Ambulatory Visit: Payer: Self-pay | Admitting: *Deleted

## 2022-10-10 DIAGNOSIS — R319 Hematuria, unspecified: Secondary | ICD-10-CM

## 2022-10-18 ENCOUNTER — Encounter: Payer: Self-pay | Admitting: Student in an Organized Health Care Education/Training Program

## 2022-10-18 ENCOUNTER — Ambulatory Visit
Payer: No Typology Code available for payment source | Attending: Student in an Organized Health Care Education/Training Program | Admitting: Student in an Organized Health Care Education/Training Program

## 2022-10-18 VITALS — BP 122/90 | HR 86 | Temp 98.2°F | Resp 2 | Ht 71.0 in | Wt 209.0 lb

## 2022-10-18 DIAGNOSIS — G894 Chronic pain syndrome: Secondary | ICD-10-CM | POA: Insufficient documentation

## 2022-10-18 DIAGNOSIS — M5136 Other intervertebral disc degeneration, lumbar region: Secondary | ICD-10-CM | POA: Insufficient documentation

## 2022-10-18 DIAGNOSIS — M62830 Muscle spasm of back: Secondary | ICD-10-CM | POA: Insufficient documentation

## 2022-10-18 MED ORDER — TIZANIDINE HCL 4 MG PO TABS: 4.0000 mg | ORAL_TABLET | Freq: Two times a day (BID) | ORAL | 2 refills | Status: AC | PRN

## 2022-10-18 NOTE — Progress Notes (Deleted)
Safety precautions to be maintained throughout the outpatient stay will include: orient to surroundings, keep bed in low position, maintain call bell within reach at all times, provide assistance with transfer out of bed and ambulation.  

## 2022-10-18 NOTE — Progress Notes (Signed)
Nursing Pain Medication Assessment:  Safety precautions to be maintained throughout the outpatient stay will include: orient to surroundings, keep bed in low position, maintain call bell within reach at all times, provide assistance with transfer out of bed and ambulation.  Medication Inspection Compliance: Pill count conducted under aseptic conditions, in front of the patient. Neither the pills nor the bottle was removed from the patient's sight at any time. Once count was completed pills were immediately returned to the patient in their original bottle.  Medication: Hydrocodone/APAP pill/Patch Count: 76/90  Pill/Patch Appearance: Markings consistent with prescribed medication Bottle Appearance: Standard pharmacy container. Clearly labeled. Filled Date: 09 / 05 / 2023 Last Medication intake:   has not had to take pain medication in approx 3 weeks.

## 2022-10-18 NOTE — Progress Notes (Signed)
PROVIDER NOTE: Information contained herein reflects review and annotations entered in association with encounter. Interpretation of such information and data should be left to medically-trained personnel. Information provided to patient can be located elsewhere in the medical record under "Patient Instructions". Document created using STT-dictation technology, any transcriptional errors that may result from process are unintentional.    Patient: John Hartman  Service Category: E/M  Provider: Gillis Santa, MD  DOB: 1971/09/28  DOS: 10/18/2022  Referring Provider: Juanda Bond, NP  MRN: [id number]  Specialty: Interventional Pain Management  PCP: Juanda Bond, NP  Type: Established Patient  Setting: Ambulatory outpatient    Location: Office  Delivery: Face-to-face     HPI  Mr. KAIYDEN SIMKIN, a 51 y.o. year old male, is here today because of his Degenerative disc disease, lumbar [M51.36]. Mr. Mose primary complain today is Other (S/P back surgery, pain is improved other than post surgical pain.  ) Last encounter: My last encounter with him was on 08/30/2022. Pertinent problems: Mr. Pryer has Coronary artery disease; Chronic lower back pain; Nephrolithiasis; Chronic pain syndrome; Lumbar radiculopathy; Spinal stenosis of lumbar region with neurogenic claudication; S/P cervical spinal fusion; and Back muscle spasm on their pertinent problem list. Pain Assessment: Severity of Chronic pain is reported as a 0-No pain/10. Location: Back (s/p back surgery 09/07/22) Other (Comment)/denies. Onset: Other (comment). Quality: Other (Comment). Timing: Other (Comment). Modifying factor(s): surgery has improved back pain. Vitals:  height is '5\' 11"'  (1.803 m) and weight is 209 lb (94.8 kg). His temporal temperature is 98.2 F (36.8 C). His blood pressure is 122/90 (abnormal) and his pulse is 86. His respiration is 2 (abnormal) and oxygen saturation is 100%.   Reason for encounter: medication management.  S/p  lumbar spine surgery L4-5 decompression States that leg pain is improved Having mild back pain at surgical site Slight redness at surgical site that is improving. Has seen surgeon for follow up. Is not taking Hydrocodone regularly Cleared Nov 1 to resume normal activities (weight restrictions removed)   ROS  Constitutional: Denies any fever or chills Gastrointestinal: No reported hemesis, hematochezia, vomiting, or acute GI distress Musculoskeletal:  low back pain Neurological: No reported episodes of acute onset apraxia, aphasia, dysarthria, agnosia, amnesia, paralysis, loss of coordination, or loss of consciousness  Medication Review  Dapagliflozin Pro-metFORMIN ER, Dulaglutide, EPINEPHrine, HYDROcodone-acetaminophen, Magnesium, OneTouch Delica Lancets 84X, allopurinol, amLODipine, aspirin EC, atorvastatin, cetirizine, diphenhydrAMINE, ezetimibe, famotidine, glucose blood, hydrALAZINE, hydrocortisone, ketoconazole, metFORMIN, metoprolol tartrate, neomycin-polymyxin b-dexamethasone, nitroGLYCERIN, pregabalin, and tiZANidine  History Review  Allergy: Mr. Bohman is allergic to imdur [isosorbide nitrate], lisinopril, other, drug [tape], tapentadol, and ace inhibitors. Drug: Mr. Goetze  reports no history of drug use. Alcohol:  reports no history of alcohol use. Tobacco:  reports that he quit smoking about 19 years ago. His smoking use included cigarettes. He has a 10.00 pack-year smoking history. He has been exposed to tobacco smoke. He has never used smokeless tobacco. Social: Mr. Swiatek  reports that he quit smoking about 19 years ago. His smoking use included cigarettes. He has a 10.00 pack-year smoking history. He has been exposed to tobacco smoke. He has never used smokeless tobacco. He reports that he does not drink alcohol and does not use drugs. Medical:  has a past medical history of Angioedema, Bronchitis, DDD (degenerative disc disease), cervical, Diabetes mellitus ([place]), Fatty liver  (03/31/2017), GERD (gastroesophageal reflux disease), Gout, Heart attack ([place]), HTN (hypertension), Hypertension, IBS (irritable bowel syndrome), Kidney stone, LVH (left ventricular hypertrophy) (11/24/2018), Myocardial  infarction ([place]) (08/23/2018), Seasonal allergies, and Spinal stenosis. Surgical: Mr. Blouch  has a past surgical history that includes Shoulder surgery (Left, 2007); Nasal septum surgery (2004); ACDF with fusion (2007); Testicular torsion (1980s); Vasectomy; Colonoscopy with propofol (N/A, 06/26/2015); Cardiac catheterization (Left, 08/15/2016); Coronary/Graft Acute MI Revascularization (N/A, 08/23/2018); LEFT HEART CATH AND CORONARY ANGIOGRAPHY (N/A, 08/23/2018); Cystoscopy with biopsy (N/A, 11/22/2019); Cystoscopy/ureteroscopy/holmium laser/stent placement (Left, 11/22/2019); Shoulder arthroscopy w/ rotator cuff repair (10/01/2019); LEFT HEART CATH AND CORONARY ANGIOGRAPHY (N/A, 02/10/2020); Cystoscopy/ureteroscopy/holmium laser (02/10/2020); Cystoscopy w/ retrogrades (02/10/2020); Extracorporeal shock wave lithotripsy (Left, 10/29/2020); Left Heart Cath (N/A, 11/04/2021); and CORONARY ANGIOGRAPHY (N/A, 11/04/2021). Family: family history includes Allergies in his son; Alzheimer's disease in his maternal grandmother; Arrhythmia in his mother; Breast cancer in his mother; Cancer in his maternal grandfather and mother; Diabetes in his father; Heart disease in his brother; Hypertension in his father; Skin cancer in his mother.  Laboratory Chemistry Profile   Renal Lab Results  Component Value Date   BUN 12 11/05/2021   CREATININE 0.91 22/29/7989   BCR NOT APPLICABLE 21/19/4174   GFRAA >60 06/17/2020   GFRNONAA >60 11/05/2021    Hepatic Lab Results  Component Value Date   AST 97 (H) 11/04/2021   ALT 125 (H) 11/04/2021   ALBUMIN 4.6 11/04/2021   ALKPHOS 75 11/04/2021   HCVAB NON REACTIVE 11/03/2021   LIPASE 45 11/05/2021    Electrolytes Lab Results  Component Value Date   NA 135  11/05/2021   K 3.8 11/05/2021   CL 103 11/05/2021   CALCIUM 8.5 (L) 11/05/2021   MG 2.2 11/05/2021   PHOS 3.3 11/05/2021    Bone Lab Results  Component Value Date   VD25OH 33.66 11/04/2021    Inflammation (CRP: Acute Phase) (ESR: Chronic Phase) Lab Results  Component Value Date   LATICACIDVEN 1.5 10/29/2020         Note: Above Lab results reviewed.  Recent Imaging Review  DG Abd 1 View CLINICAL DATA:  Kidney stone.  EXAM: ABDOMEN - 1 VIEW  COMPARISON:  CT 11/03/2021  FINDINGS: No radiopaque calculi project over the renal beds or course of the ureters. Calcifications in the right greater than left pelvis correspond to phleboliths on prior CT. Moderate colonic stool burden without evidence of obstruction.  IMPRESSION: No radiopaque urinary tract calculi.  Electronically Signed   By: Keith Rake M.D.   On: 07/06/2022 17:05 Note: Reviewed        Physical Exam  General appearance: Well nourished, well developed, and well hydrated. In no apparent acute distress Mental status: Alert, oriented x 3 (person, place, & time)       Respiratory: No evidence of acute respiratory distress Eyes: PERLA Vitals: BP (!) 122/90 (BP Location: Left Arm, Patient Position: Sitting, Cuff Size: Large)   Pulse 86   Temp 98.2 F (36.8 C) (Temporal)   Resp (!) 2   Ht '5\' 11"'  (1.803 m)   Wt 209 lb (94.8 kg)   SpO2 100%   BMI 29.15 kg/m  BMI: Estimated body mass index is 29.15 kg/m as calculated from the following:   Height as of this encounter: '5\' 11"'  (1.803 m).   Weight as of this encounter: 209 lb (94.8 kg). Ideal: Ideal body weight: 75.3 kg (166 lb 0.1 oz) Adjusted ideal body weight: 83.1 kg (183 lb 3.3 oz)  + low back pain, slight redness and tenderness at surgical site, occasional lumbar paraspinal spasms 5 out of 5 strength bilateral lower extremity: Plantar flexion, dorsiflexion, knee  flexion, knee extension.   Assessment   Diagnosis Status  1. Degenerative disc  disease, lumbar   2. Back muscle spasm   3. Chronic pain syndrome    Controlled Controlled Controlled   Updated Problems: No problems updated.   Plan of Care  Problem-specific:  No problem-specific Assessment & Plan notes found for this encounter.  Mr. ADAR RASE has a current medication list which includes the following long-term medication(s): allopurinol, amlodipine, ezetimibe, hydralazine, metoprolol tartrate, nitroglycerin, pregabalin, and [DISCONTINUED] metformin.  Pharmacotherapy (Medications Ordered): Meds ordered this encounter  Medications   tiZANidine (ZANAFLEX) 4 MG tablet    Sig: Take 1-2 tablets (4-8 mg total) by mouth every 12 (twelve) hours as needed for muscle spasms.    Dispense:  60 tablet    Refill:  2   Orders:  No orders of the defined types were placed in this encounter.  Follow-up plan:   Return if symptoms worsen or fail to improve.     Right T1-T2 ESI 07/21/20, 09/01/21; RIGHT GONB 09/01/21         Recent Visits Date Type Provider Dept  08/30/22 Office Visit Gillis Santa, MD [facility]  07/28/22 Office Visit Gillis Santa, MD [facility]  Showing recent visits within past 90 days and meeting all other requirements Today's Visits Date Type Provider Dept  10/18/22 Office Visit Gillis Santa, MD [facility]  Showing today's visits and meeting all other requirements Future Appointments No visits were found meeting these conditions. Showing future appointments within next 90 days and meeting all other requirements  I discussed the assessment and treatment plan with the patient. The patient was provided an opportunity to ask questions and all were answered. The patient agreed with the plan and demonstrated an understanding of the instructions.  Patient advised to call back or seek an in-person evaluation if the symptoms or condition worsens.  Duration of encounter: 84mnutes.  Total time on encounter, as per AMA  guidelines included both the face-to-face and non-face-to-face time personally spent by the physician and/or other qualified health care professional(s) on the day of the encounter (includes time in activities that require the physician or other qualified health care professional and does not include time in activities normally performed by clinical staff). Physician's time may include the following activities when performed: preparing to see the patient (eg, review of tests, pre-charting review of records) obtaining and/or reviewing separately obtained history performing a medically appropriate examination and/or evaluation counseling and educating the patient/family/caregiver ordering medications, tests, or procedures referring and communicating with other health care professionals (when not separately reported) documenting clinical information in the electronic or other health record independently interpreting results (not separately reported) and communicating results to the patient/ family/caregiver care coordination (not separately reported)  Note by: BGillis Santa MD Date: 10/18/2022; Time: 3:25 PM

## 2022-10-20 ENCOUNTER — Ambulatory Visit
Admission: RE | Admit: 2022-10-20 | Discharge: 2022-10-20 | Disposition: A | Discharge status: Home / Self Care | Payer: No Typology Code available for payment source | Source: Ambulatory Visit | Attending: Urology | Admitting: Urology

## 2022-10-20 DIAGNOSIS — R319 Hematuria, unspecified: Secondary | ICD-10-CM | POA: Insufficient documentation

## 2022-10-20 LAB — POCT I-STAT CREATININE: Creatinine, Ser: 1.1 mg/dL (ref 0.61–1.24)

## 2022-10-20 MED ORDER — IOHEXOL 300 MG/ML  SOLN
150.0000 mL | Freq: Once | INTRAMUSCULAR | Status: AC | PRN
  Administered 2022-10-20: 125 mL via INTRAVENOUS

## 2022-10-28 ENCOUNTER — Other Ambulatory Visit: Payer: Self-pay | Admitting: Student in an Organized Health Care Education/Training Program

## 2022-10-28 DIAGNOSIS — G894 Chronic pain syndrome: Secondary | ICD-10-CM

## 2022-10-28 DIAGNOSIS — M62830 Muscle spasm of back: Secondary | ICD-10-CM

## 2022-10-28 DIAGNOSIS — M5136 Other intervertebral disc degeneration, lumbar region: Secondary | ICD-10-CM

## 2023-01-11 ENCOUNTER — Emergency Department: Payer: No Typology Code available for payment source

## 2023-01-11 ENCOUNTER — Emergency Department
Admission: EM | Admit: 2023-01-11 | Discharge: 2023-01-11 | Disposition: A | Discharge status: Home / Self Care | Payer: No Typology Code available for payment source | Attending: Emergency Medicine | Admitting: Emergency Medicine

## 2023-01-11 ENCOUNTER — Other Ambulatory Visit: Payer: Self-pay

## 2023-01-11 DIAGNOSIS — E119 Type 2 diabetes mellitus without complications: Secondary | ICD-10-CM | POA: Diagnosis not present

## 2023-01-11 DIAGNOSIS — W19XXXA Unspecified fall, initial encounter: Secondary | ICD-10-CM | POA: Insufficient documentation

## 2023-01-11 DIAGNOSIS — S4991XA Unspecified injury of right shoulder and upper arm, initial encounter: Secondary | ICD-10-CM | POA: Diagnosis present

## 2023-01-11 DIAGNOSIS — S40211A Abrasion of right shoulder, initial encounter: Secondary | ICD-10-CM | POA: Insufficient documentation

## 2023-01-11 DIAGNOSIS — I110 Hypertensive heart disease with heart failure: Secondary | ICD-10-CM | POA: Insufficient documentation

## 2023-01-11 DIAGNOSIS — R55 Syncope and collapse: Secondary | ICD-10-CM | POA: Diagnosis not present

## 2023-01-11 DIAGNOSIS — I2510 Atherosclerotic heart disease of native coronary artery without angina pectoris: Secondary | ICD-10-CM | POA: Insufficient documentation

## 2023-01-11 DIAGNOSIS — Z86018 Personal history of other benign neoplasm: Secondary | ICD-10-CM | POA: Diagnosis not present

## 2023-01-11 DIAGNOSIS — I5022 Chronic systolic (congestive) heart failure: Secondary | ICD-10-CM | POA: Diagnosis not present

## 2023-01-11 LAB — CBC
HCT: 51.9 % (ref 39.0–52.0)
Hemoglobin: 17 g/dL (ref 13.0–17.0)
MCH: 27.3 pg (ref 26.0–34.0)
MCHC: 32.8 g/dL (ref 30.0–36.0)
MCV: 83.4 fL (ref 80.0–100.0)
Platelets: 209 10*3/uL (ref 150–400)
RBC: 6.22 MIL/uL — ABNORMAL HIGH (ref 4.22–5.81)
RDW: 13.8 % (ref 11.5–15.5)
WBC: 7.6 10*3/uL (ref 4.0–10.5)
nRBC: 0 % (ref 0.0–0.2)

## 2023-01-11 LAB — BASIC METABOLIC PANEL
Anion gap: 10 (ref 5–15)
BUN: 17 mg/dL (ref 6–20)
CO2: 22 mmol/L (ref 22–32)
Calcium: 8.2 mg/dL — ABNORMAL LOW (ref 8.9–10.3)
Chloride: 103 mmol/L (ref 98–111)
Creatinine, Ser: 1.02 mg/dL (ref 0.61–1.24)
GFR, Estimated: >60 mL/min (ref >60)
Glucose, Bld: 147 mg/dL — ABNORMAL HIGH (ref 70–99)
Potassium: 4.2 mmol/L (ref 3.5–5.1)
Sodium: 135 mmol/L (ref 135–145)

## 2023-01-11 LAB — CBG MONITORING, ED: Glucose-Capillary: 142 mg/dL — ABNORMAL HIGH (ref 70–99)

## 2023-01-11 MED ORDER — SODIUM CHLORIDE 0.9 % IV BOLUS
1000.0000 mL | Freq: Once | INTRAVENOUS | Status: AC
  Administered 2023-01-11: 1000 mL via INTRAVENOUS

## 2023-01-11 NOTE — ED Notes (Signed)
Pt given ice water and graham crackers with peanut butter.

## 2023-01-11 NOTE — ED Triage Notes (Signed)
Pt to ED via POV from home. Pt reports he started feeling nauseas after hitting a vape pen and went to the bathroom and had 2 syncopal episodes. Pt had 1 more witnessed syncopal episode with wife present. Did hit his head. Pt also reports decreased appetite. EMS states negative orthostatic screen. Pt with hx IBS, MI with stent placement and DM.   EMS VS: CBG 139, 183 , 196 Temp 98.1 93-94% RA BP 125/93 --> 119/83

## 2023-01-11 NOTE — ED Provider Notes (Signed)
[facility] Provider Note    Event Date/Time   First MD Initiated Contact with Patient 01/11/23 1307     (approximate)   History   Loss of Consciousness   HPI  John Hartman is a 52 y.o. male past medical history of coronary artery disease and NSTEMI, hypertension, diabetes who presents after several syncopal episodes.  Patient says he was having exacerbation of his IBS yesterday having alternating diarrhea constipation did not eat much of anything since lunch yesterday.  This morning he was still having some abdominal cramping so used a THC vape pen.  He then got nauseous and felt he needed to have a bowel movement.  Went to use the bathroom when he stood up he then remembers waking up on the ground.  Says that he felt dizzy and very warm when trying to get up again and subsequently fell twice.  Denies any chest pain or dyspnea.  Denies any history of syncope.  Has been using the [facility] pen several times over the last several weeks and has not had any reaction like this.  Denies any ongoing abdominal pain.   Patient received a bolus of fluid.  I personally ambulated him he is asymptomatic and not presyncopal.  Ultimately my suspicion for cardiogenic syncope given the prodrome and events leading up to the episode is low.  I think that he is appropriate for discharge.  We did discuss return precautions. Past Medical History:  Diagnosis Date   Angioedema    Bronchitis    DDD (degenerative disc disease), cervical    Diabetes mellitus ([place])    Fatty liver 03/31/2017   [place] April 2018   GERD (gastroesophageal reflux disease)    Gout    Heart attack ([place])    HTN (hypertension)    Hypertension    CONTROLLED ON MEDS   IBS (irritable bowel syndrome)    Kidney stone    LVH (left ventricular hypertrophy) 11/24/2018   Moderate, ECHO   Myocardial infarction ([place]) 08/23/2018   Seasonal allergies    Spinal stenosis     Patient Active Problem List   Diagnosis Date Noted    Chest pain 11/03/2021   Occipital neuralgia of right side 10/06/2021   Pain management contract signed 07/19/2021   Cervical radicular pain 07/13/2021   Perinephric hematoma 10/29/2020   Back muscle spasm 10/28/2020   Chronic pain syndrome 04/30/2020   Lumbar radiculopathy 04/30/2020   Spinal stenosis of lumbar region with neurogenic claudication 04/30/2020   S/P cervical spinal fusion 04/30/2020   HFrEF (heart failure with reduced ejection fraction) ([place]) 02/18/2020   Nephrolithiasis 02/18/2020   OSA (obstructive sleep apnea) 02/17/2020   Right ureteral stone 02/07/2020   Diabetes mellitus without complication ([place]) 70/62/3762   Chronic systolic CHF (congestive heart failure) ([place]) 02/07/2020   Unspecified inflammatory spondylopathy, 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[place]) 01/06/2020   Arthritis of right acromioclavicular joint 09/23/2019   Incomplete tear of right rotator cuff 09/23/2019   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   H/O non-ST elevation myocardial infarction (NSTEMI) 11/24/2018   LVH (left ventricular hypertrophy) 11/24/2018   Retrolisthesis of vertebrae 11/24/2018   History of angioedema 09/01/2018   NSTEMI (non-ST elevated myocardial infarction) ([place]) 08/22/2018   Chronic lower back pain 08/04/2017   Degenerative disc disease, lumbar 08/04/2017   Lumbar facet arthropathy 08/04/2017   Calcification of abdominal aorta ([place]) 08/04/2017   NAFL (nonalcoholic fatty liver) 83/15/1761   Hyperlipidemia LDL goal <70 01/04/2017   Elevated serum glutamic pyruvic transaminase (SGPT)  level 01/04/2017   Coronary artery disease 08/25/2016   Ketonuria 07/12/2016   Glucosuria 07/12/2016   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/2015   Family history of malignant neoplasm of gastrointestinal tract    Benign neoplasm of rectosigmoid junction    Essential hypertension 05/28/2015   Gout 05/28/2015   IBS (irritable bowel syndrome) 05/28/2015     Physical Exam  Triage Vital Signs: ED Triage Vitals  Enc Vitals Group      BP 01/11/23 1139 (!) 132/91     Pulse Rate 01/11/23 1139 85     Resp 01/11/23 1139 18     Temp 01/11/23 1139 98.4 F (36.9 C)     Temp Source 01/11/23 1139 Oral     SpO2 01/11/23 1139 96 %     Weight --      Height --      Head Circumference --      Peak Flow --      Pain Score 01/11/23 1140 3     Pain Loc --      Pain Edu? --      Excl. in [place]? --     Most recent vital signs: Vitals:   01/11/23 1139 01/11/23 1306  BP: (!) 132/91 (!) 125/99  Pulse: 85 85  Resp: 18 19  Temp: 98.4 F (36.9 C)   SpO2: 96% 97%     General: Awake, no distress.  CV:  Good peripheral perfusion.  No murmur Resp:  Normal effort.  Lungs are clear Abd:  No distention.  Abdomen is soft nontender throughout Neuro:             Awake, Alert, Oriented x 3  Other:  Superficial abrasion over the right scapula   ED Results / Procedures / Treatments  Labs (all labs ordered are listed, but only abnormal results are displayed) Labs Reviewed  BASIC METABOLIC PANEL - Abnormal; Notable for the following components:      Result Value   Glucose, Bld 147 (*)    Calcium 8.2 (*)    All other components within normal limits  CBC - Abnormal; Notable for the following components:   RBC 6.22 (*)    All other components within normal limits  CBG MONITORING, ED - Abnormal; Notable for the following components:   Glucose-Capillary 142 (*)    All other components within normal limits  URINALYSIS, ROUTINE W REFLEX MICROSCOPIC     EKG  EKG interpretation performed by myself: NSR, nml axis, nml intervals, no acute ischemic changes    RADIOLOGY I reviewed and interpreted the CT scan of the brain which does not show any acute intracranial process    PROCEDURES:  Critical Care performed: No  .1-3 Lead EKG Interpretation  Performed by: Rada Hay, MD Authorized by: Rada Hay, MD     Interpretation: normal     ECG rate assessment: normal     Rhythm: sinus rhythm     Ectopy: none      Conduction: normal     The patient is on the cardiac monitor to evaluate for evidence of arrhythmia and/or significant heart rate changes.   MEDICATIONS ORDERED IN ED: Medications  sodium chloride 0.9 % bolus 1,000 mL (1,000 mLs Intravenous New Bag/Given 01/11/23 1344)     IMPRESSION / MDM / ASSESSMENT AND PLAN / ED COURSE  I reviewed the triage vital signs and the nursing notes.  Patient's presentation is most consistent with acute complicated illness / injury requiring diagnostic workup.  Differential diagnosis includes, but is not limited to, vasovagal syncope, orthostatic syncope, dehydration, side effect of THC, less likely cardiogenic including arrhythmia or valvular disorder    Patient is a 52 year old male presents after syncope.  He has had some GI symptoms over the last day or so with alternating diarrhea constipation and has not eaten much.  Today used a THC pen and then felt like he needed to have a bowel movement and felt nauseous and was dry heaving.  Went to the bathroom and when he stood up next and he remembers he was on the ground likely had a syncopal episode.  Felt dizzy and was presyncopal when trying to stand up twice after this.  Patient's vital signs are reassuring overall he looks well on exam.  No signs of trauma other than a superficial abrasion over the right scapula.  EKG without concerning abnormalities to suggest a arrhythmia as source of his syncope.  Labs including CBC and BMP are reassuring.  Blood sugar just mildly elevated.  Patient is not having chest pain or any anginal symptoms so no indication to obtain cardiac enzymes.  I suspect that this was likely vasovagal syncope versus orthostatic syncope.  Think is a combination of patient had not been eating over the last day due to his IBS symptoms and the bowel movement may have triggered vasovagal episode.  Possible this could have also been side effect of the THC.  Plan to  give patient a fluid bolus and ambulate to make sure he is not feeling presyncopal.  Anticipate discharge.   FINAL CLINICAL IMPRESSION(S) / ED DIAGNOSES   Final diagnoses:  Syncope, unspecified syncope type     Rx / DC Orders   ED Discharge Orders     None        Note:  This document was prepared using Dragon voice recognition software and may include unintentional dictation errors.   Rada Hay, MD 01/11/23 1447

## 2023-01-11 NOTE — Discharge Instructions (Addendum)
I suspect that you had vasovagal syncope likely in part due to dehydration and potentially related to having a bowel movement and using the THC.  Please make sure you are staying hydrated.  Return to the ER if you develop chest pain, shortness of breath or any other symptoms that are concerning to you.

## 2023-03-08 ENCOUNTER — Other Ambulatory Visit: Payer: Self-pay | Admitting: *Deleted

## 2023-03-08 ENCOUNTER — Encounter: Payer: Self-pay | Admitting: Student in an Organized Health Care Education/Training Program

## 2023-03-08 NOTE — Telephone Encounter (Signed)
Rx request sent to Dr. Lateef 

## 2023-03-09 MED ORDER — TIZANIDINE HCL 4 MG PO TABS: 4.0000 mg | ORAL_TABLET | Freq: Three times a day (TID) | ORAL | 1 refills | Status: DC | PRN

## 2023-05-17 ENCOUNTER — Other Ambulatory Visit: Payer: Self-pay | Admitting: Student in an Organized Health Care Education/Training Program

## 2023-07-11 ENCOUNTER — Ambulatory Visit: Payer: No Typology Code available for payment source | Admitting: Urology

## 2023-07-31 ENCOUNTER — Encounter: Payer: Self-pay | Admitting: Student in an Organized Health Care Education/Training Program

## 2023-07-31 MED ORDER — TIZANIDINE HCL 4 MG PO TABS: 4.0000 mg | ORAL_TABLET | Freq: Three times a day (TID) | ORAL | 1 refills | Status: DC | PRN

## 2023-08-03 ENCOUNTER — Encounter
Payer: No Typology Code available for payment source | Admitting: Student in an Organized Health Care Education/Training Program

## 2023-08-24 ENCOUNTER — Ambulatory Visit
Payer: No Typology Code available for payment source | Attending: Student in an Organized Health Care Education/Training Program | Admitting: Student in an Organized Health Care Education/Training Program

## 2023-08-24 ENCOUNTER — Encounter: Payer: Self-pay | Admitting: Student in an Organized Health Care Education/Training Program

## 2023-08-24 VITALS — BP 145/120 | HR 95 | Temp 97.7°F | Resp 16

## 2023-08-24 DIAGNOSIS — M47816 Spondylosis without myelopathy or radiculopathy, lumbar region: Secondary | ICD-10-CM | POA: Diagnosis present

## 2023-08-24 DIAGNOSIS — M5136 Other intervertebral disc degeneration, lumbar region: Secondary | ICD-10-CM | POA: Diagnosis present

## 2023-08-24 DIAGNOSIS — G894 Chronic pain syndrome: Secondary | ICD-10-CM | POA: Diagnosis present

## 2023-08-24 DIAGNOSIS — M5416 Radiculopathy, lumbar region: Secondary | ICD-10-CM | POA: Insufficient documentation

## 2023-08-24 MED ORDER — METHOCARBAMOL 1000 MG/10ML IJ SOLN
200.0000 mg | Freq: Once | INTRAMUSCULAR | Status: AC
  Administered 2023-08-24: 1000 mg via INTRAMUSCULAR

## 2023-08-24 MED ORDER — KETOROLAC TROMETHAMINE 30 MG/ML IJ SOLN
INTRAMUSCULAR | Status: AC
  Filled 2023-08-24: qty 1

## 2023-08-24 MED ORDER — METHOCARBAMOL 1000 MG/10ML IJ SOLN
INTRAMUSCULAR | Status: AC
  Filled 2023-08-24: qty 10

## 2023-08-24 MED ORDER — KETOROLAC TROMETHAMINE 30 MG/ML IJ SOLN
30.0000 mg | Freq: Once | INTRAMUSCULAR | Status: AC
  Administered 2023-08-24: 30 mg via INTRAMUSCULAR

## 2023-08-24 NOTE — Progress Notes (Addendum)
PROVIDER NOTE: Information contained herein reflects review and annotations entered in association with encounter. Interpretation of such information and data should be left to medically-trained personnel. Information provided to patient can be located elsewhere in the medical record under "Patient Instructions". Document created using STT-dictation technology, any transcriptional errors that may result from process are unintentional.    Patient: John Hartman  Service Category: E/M  Provider: Edward Jolly, MD  DOB: 04-Feb-1971  DOS: 08/24/2023  Referring Provider: Ethelene Browns, NP  MRN: [id number]  Specialty: Interventional Pain Management  PCP: Ethelene Browns, NP  Type: Established Patient  Setting: Ambulatory outpatient    Location: Office  Delivery: Face-to-face     HPI  [name], a 52 y.o. year old male, is here today because of his Degenerative disc disease, lumbar [M51.36]. [name] primary complain today is Back Pain (Low right back/hiparea)  Pertinent problems: [name] has Coronary artery disease; Chronic lower back pain; Nephrolithiasis; Chronic pain syndrome; Lumbar radiculopathy; Spinal stenosis of lumbar region with neurogenic claudication; S/P cervical spinal fusion; and Back muscle spasm on their pertinent problem list. Pain Assessment: Severity of Chronic pain is reported as a 8 /10. Location: Back Right, Lower/radiates down both legs. Onset: More than a month ago. Quality: Constant, Aching, Sharp, Tingling. Timing: Constant. Modifying factor(s): meds. Vitals:  temperature is 97.7 F (36.5 C). His blood pressure is 145/120 (abnormal) and his pulse is 95. His respiration is 16 and oxygen saturation is 98%.  BMI: Estimated body mass index is 29.15 kg/m as calculated from the following:   Height as of 10/18/22: 5\' 11"  (1.803 m).   Weight as of 10/18/22: 209 lb (94.8 kg). Last encounter: 10/18/2022. Last procedure: Visit date not found.  Reason for encounter:   Intramuscular Robaxin and Toradol .   ROS  Constitutional: Denies any fever or chills Gastrointestinal: No reported hemesis, hematochezia, vomiting, or acute GI distress Musculoskeletal:  Low back, right hip pain Neurological: No reported episodes of acute onset apraxia, aphasia, dysarthria, agnosia, amnesia, paralysis, loss of coordination, or loss of consciousness  Medication Review  Dapagliflozin Pro-metFORMIN ER, Dulaglutide, EPINEPHrine, HYDROcodone-acetaminophen, Magnesium, OneTouch Delica Lancets 33G, allopurinol, amLODipine, aspirin EC, atorvastatin, cetirizine, diphenhydrAMINE, ezetimibe, famotidine, glucose blood, hydrALAZINE, hydrocortisone, ketoconazole, metFORMIN, metoprolol tartrate, neomycin-polymyxin b-dexamethasone, nitroGLYCERIN, pregabalin, and tiZANidine  History Review  Allergy: John Hartman is allergic to imdur [isosorbide nitrate], lisinopril, other, drug [tape], tapentadol, and ace inhibitors. Drug: [name]  reports no history of drug use. Alcohol:  reports no history of alcohol use. Tobacco:  reports that he quit smoking about 20 years ago. His smoking use included cigarettes. He started smoking about 30 years ago. He has a 10 pack-year smoking history. He has been exposed to tobacco smoke. He has never used smokeless tobacco. Social: [name]  reports that he quit smoking about 20 years ago. His smoking use included cigarettes. He started smoking about 30 years ago. He has a 10 pack-year smoking history. He has been exposed to tobacco smoke. He has never used smokeless tobacco. He reports that he does not drink alcohol and does not use drugs. Medical:  has a past medical history of Angioedema, Bronchitis, DDD (degenerative disc disease), cervical, Diabetes mellitus (HCC), Fatty liver (03/31/2017), GERD (gastroesophageal reflux disease), Gout, Heart attack (HCC), HTN (hypertension), Hypertension, IBS (irritable bowel syndrome), Kidney stone, LVH (left ventricular hypertrophy)  (11/24/2018), Myocardial infarction (HCC) (08/23/2018), Seasonal allergies, and Spinal stenosis. Surgical: [name]  has a past surgical history that includes Shoulder surgery (Left,  2007); Nasal septum surgery (2004); ACDF with fusion (2007); Testicular torsion (1980s); Vasectomy; Colonoscopy with propofol (N/A, 06/26/2015); Cardiac catheterization (Left, 08/15/2016); Coronary/Graft Acute MI Revascularization (N/A, 08/23/2018); LEFT HEART CATH AND CORONARY ANGIOGRAPHY (N/A, 08/23/2018); Cystoscopy with biopsy (N/A, 11/22/2019); Cystoscopy/ureteroscopy/holmium laser/stent placement (Left, 11/22/2019); Shoulder arthroscopy w/ rotator cuff repair (10/01/2019); LEFT HEART CATH AND CORONARY ANGIOGRAPHY (N/A, 02/10/2020); Cystoscopy/ureteroscopy/holmium laser (02/10/2020); Cystoscopy w/ retrogrades (02/10/2020); Extracorporeal shock wave lithotripsy (Left, 10/29/2020); Left Heart Cath (N/A, 11/04/2021); and CORONARY ANGIOGRAPHY (N/A, 11/04/2021). Family: family history includes Allergies in his son; Alzheimer's disease in his maternal grandmother; Arrhythmia in his mother; Breast cancer in his mother; Cancer in his maternal grandfather and mother; Diabetes in his father; Heart disease in his brother; Hypertension in his father; Skin cancer in his mother.  Laboratory Chemistry Profile   Renal Lab Results  Component Value Date   BUN 17 01/11/2023   CREATININE 1.02 01/11/2023   BCR NOT APPLICABLE 01/06/2020   GFRAA >60 06/17/2020   GFRNONAA >60 01/11/2023    Hepatic Lab Results  Component Value Date   AST 97 (H) 11/04/2021   ALT 125 (H) 11/04/2021   ALBUMIN 4.6 11/04/2021   ALKPHOS 75 11/04/2021   HCVAB NON REACTIVE 11/03/2021   LIPASE 45 11/05/2021    Electrolytes Lab Results  Component Value Date   NA 135 01/11/2023   K 4.2 01/11/2023   CL 103 01/11/2023   CALCIUM 8.2 (L) 01/11/2023   MG 2.2 11/05/2021   PHOS 3.3 11/05/2021    Bone Lab Results  Component Value Date   VD25OH 33.66 11/04/2021     Inflammation (CRP: Acute Phase) (ESR: Chronic Phase) Lab Results  Component Value Date   LATICACIDVEN 1.5 10/29/2020         Note: Above Lab results reviewed.  Recent Imaging Review  CT HEAD WO CONTRAST CLINICAL DATA:  Head trauma.  Multiple syncopal episode.  EXAM: CT HEAD WITHOUT CONTRAST  TECHNIQUE: Contiguous axial images were obtained from the base of the skull through the vertex without intravenous contrast.  RADIATION DOSE REDUCTION: This exam was performed according to the departmental dose-optimization program which includes automated exposure control, adjustment of the mA and/or kV according to patient size and/or use of iterative reconstruction technique.  COMPARISON:  CT head dated March 18, 2021.  FINDINGS: Brain: No evidence of acute infarction, hemorrhage, hydrocephalus, extra-axial collection or mass lesion/mass effect.  Vascular: No hyperdense vessel or unexpected calcification.  Skull: Normal. Negative for fracture or focal lesion.  Sinuses/Orbits: No acute finding.  Other: None.  IMPRESSION: No acute intracranial pathology.  Electronically Signed   By: Larose Hires Hartman.O.   On: 01/11/2023 11:55 Note: Reviewed         Assessment   Diagnosis  1. Degenerative disc disease, lumbar   2. Lumbar radiculopathy   3. Lumbar facet arthropathy   4. Chronic pain syndrome       Plan of Care    Mr. RYOTT CERVENY has a current medication list which includes the following long-term medication(s): allopurinol, amlodipine, ezetimibe, hydralazine, metoprolol tartrate, nitroglycerin, pregabalin, and [DISCONTINUED] metformin.  Pharmacotherapy (Medications Ordered): Meds ordered this encounter  Medications   ketorolac (TORADOL) 30 MG/ML injection 30 mg   methocarbamol (ROBAXIN) injection 200 mg   Orders:  No orders of the defined types were placed in this encounter.  Follow-up plan:   No follow-ups on file.     Recent Visits No visits were  found meeting these conditions. Showing recent visits within past 90 days  and meeting all other requirements Today's Visits Date Type Provider Dept  08/24/23 Procedure visit Edward Jolly, MD [facility]  Showing today's visits and meeting all other requirements Future Appointments Date Type Provider Dept  09/07/23 Appointment Edward Jolly, MD [facility]  Showing future appointments within next 90 days and meeting all other requirements    Note by: Edward Jolly, MD Date: 08/24/2023; Time: 2:53 PM

## 2023-08-28 ENCOUNTER — Other Ambulatory Visit: Payer: Self-pay | Admitting: Student in an Organized Health Care Education/Training Program

## 2023-09-07 ENCOUNTER — Encounter
Payer: No Typology Code available for payment source | Admitting: Student in an Organized Health Care Education/Training Program

## 2023-09-12 ENCOUNTER — Ambulatory Visit
Payer: No Typology Code available for payment source | Attending: Student in an Organized Health Care Education/Training Program | Admitting: Student in an Organized Health Care Education/Training Program

## 2023-09-12 VITALS — Temp 97.3°F | Resp 18

## 2023-09-12 DIAGNOSIS — M5136 Other intervertebral disc degeneration, lumbar region: Secondary | ICD-10-CM | POA: Insufficient documentation

## 2023-09-12 DIAGNOSIS — G894 Chronic pain syndrome: Secondary | ICD-10-CM | POA: Insufficient documentation

## 2023-09-12 DIAGNOSIS — M5416 Radiculopathy, lumbar region: Secondary | ICD-10-CM | POA: Diagnosis present

## 2023-09-12 NOTE — Progress Notes (Signed)
PROVIDER NOTE: Information contained herein reflects review and annotations entered in association with encounter. Interpretation of such information and data should be left to medically-trained personnel. Information provided to patient can be located elsewhere in the medical record under "Patient Instructions". Document created using STT-dictation technology, any transcriptional errors that may result from process are unintentional.    Patient: John Hartman  Service Category: E/M  Provider: Edward Jolly, MD  DOB: 1971-10-19  DOS: 09/12/2023  Referring Provider: Ethelene Browns, NP  MRN: [id number]  Specialty: Interventional Pain Management  PCP: John Browns, NP  Type: Established Patient  Setting: Ambulatory outpatient    Location: Office  Delivery: Face-to-face     HPI  John Hartman, a 52 y.o. year old male, is here today because of his Degenerative disc disease, lumbar [M51.36]. John Hartman primary complain today is No chief complaint on file.  Pertinent problems: John Hartman has Coronary artery disease; Chronic lower back pain; Nephrolithiasis; Chronic pain syndrome; Lumbar radiculopathy; Spinal stenosis of lumbar region with neurogenic claudication; S/P cervical spinal fusion; and Back muscle spasm on their pertinent problem list. Pain Assessment: Severity of   is reported as a  /10. Location:    / . Onset:  . Quality:  . Timing:  . Modifying factor(s):  Marland Kitchen Vitals:  temperature is 97.3 F (36.3 C) (abnormal). His respiration is 18.  BMI: Estimated body mass index is 29.15 kg/m as calculated from the following:   Height as of 10/18/22: 5\' 11"  (1.803 m).   Weight as of 10/18/22: 209 lb (94.8 kg). Last encounter: 10/18/2022. Last procedure: 08/24/2023.  Reason for encounter:   Patient overall is doing well.  He comes in today to inform me of his health update.  He states that he is eating significantly less processed food and is obtaining food from the [employer].  He has lost  weight in the last 4 months and has more energy and from a mental standpoint is also doing better.  He states that he has been to [facility] in [place].  This will be happening the next 1 to 2 months.  He is very appreciative and grateful of his care here.  We discussed strategies and reviewed ways to help manage his pain.  I am still available via virtual consultation for John Hartman needs anything.  ROS  Constitutional: Denies any fever or chills Gastrointestinal: No reported hemesis, hematochezia, vomiting, or acute GI distress Musculoskeletal:  Mild low back pain Neurological: No reported episodes of acute onset apraxia, aphasia, dysarthria, agnosia, amnesia, paralysis, loss of coordination, or loss of consciousness  Medication Review  Dapagliflozin Pro-metFORMIN ER, Dulaglutide, EPINEPHrine, HYDROcodone-acetaminophen, Magnesium, OneTouch Delica Lancets 33G, allopurinol, amLODipine, aspirin EC, atorvastatin, cetirizine, diphenhydrAMINE, ezetimibe, famotidine, glucose blood, hydrALAZINE, hydrocortisone, ketoconazole, metFORMIN, metoprolol tartrate, neomycin-polymyxin b-dexamethasone, nitroGLYCERIN, pregabalin, and tiZANidine  History Review  Allergy: John Hartman is allergic to imdur [isosorbide nitrate], lisinopril, other, drug [tape], tapentadol, and ace inhibitors. Drug: John Hartman  reports no history of drug use. Alcohol:  reports no history of alcohol use. Tobacco:  reports that he quit smoking about 20 years ago. His smoking use included cigarettes. He started smoking about 30 years ago. He has a 10 pack-year smoking history. He has been exposed to tobacco smoke. He has never used smokeless tobacco. Social: John Hartman  reports that he quit smoking about 20 years ago. His smoking use included cigarettes. He started smoking about 30 years ago. He has a 10 pack-year smoking history. He has  been exposed to tobacco smoke. He has never used smokeless tobacco. He reports that he does not  drink alcohol and does not use drugs. Medical:  has a past medical history of Angioedema, Bronchitis, DDD (degenerative disc disease), cervical, Diabetes mellitus (HCC), Fatty liver (03/31/2017), GERD (gastroesophageal reflux disease), Gout, Heart attack (HCC), HTN (hypertension), Hypertension, IBS (irritable bowel syndrome), Kidney stone, LVH (left ventricular hypertrophy) (11/24/2018), Myocardial infarction (HCC) (08/23/2018), Seasonal allergies, and Spinal stenosis. Surgical: John Hartman  has a past surgical history that includes Shoulder surgery (Left, 2007); Nasal septum surgery (2004); ACDF with fusion (2007); Testicular torsion (1980s); Vasectomy; Colonoscopy with propofol (N/A, 06/26/2015); Cardiac catheterization (Left, 08/15/2016); Coronary/Graft Acute MI Revascularization (N/A, 08/23/2018); LEFT HEART CATH AND CORONARY ANGIOGRAPHY (N/A, 08/23/2018); Cystoscopy with biopsy (N/A, 11/22/2019); Cystoscopy/ureteroscopy/holmium laser/stent placement (Left, 11/22/2019); Shoulder arthroscopy w/ rotator cuff repair (10/01/2019); LEFT HEART CATH AND CORONARY ANGIOGRAPHY (N/A, 02/10/2020); Cystoscopy/ureteroscopy/holmium laser (02/10/2020); Cystoscopy w/ retrogrades (02/10/2020); Extracorporeal shock wave lithotripsy (Left, 10/29/2020); Left Heart Cath (N/A, 11/04/2021); and CORONARY ANGIOGRAPHY (N/A, 11/04/2021). Family: family history includes Allergies in his son; Alzheimer's disease in his maternal grandmother; Arrhythmia in his mother; Breast cancer in his mother; Cancer in his maternal grandfather and mother; Diabetes in his father; Heart disease in his brother; Hypertension in his father; Skin cancer in his mother.  Laboratory Chemistry Profile   Renal Lab Results  Component Value Date   BUN 17 01/11/2023   CREATININE 1.02 01/11/2023   BCR NOT APPLICABLE 01/06/2020   GFRAA >60 06/17/2020   GFRNONAA >60 01/11/2023    Hepatic Lab Results  Component Value Date   AST 97 (H) 11/04/2021   ALT 125 (H)  11/04/2021   ALBUMIN 4.6 11/04/2021   ALKPHOS 75 11/04/2021   HCVAB NON REACTIVE 11/03/2021   LIPASE 45 11/05/2021    Electrolytes Lab Results  Component Value Date   NA 135 01/11/2023   K 4.2 01/11/2023   CL 103 01/11/2023   CALCIUM 8.2 (L) 01/11/2023   MG 2.2 11/05/2021   PHOS 3.3 11/05/2021    Bone Lab Results  Component Value Date   VD25OH 33.66 11/04/2021    Inflammation (CRP: Acute Phase) (ESR: Chronic Phase) Lab Results  Component Value Date   LATICACIDVEN 1.5 10/29/2020         Note: Above Lab results reviewed.  Recent Imaging Review  CT HEAD WO CONTRAST CLINICAL DATA:  Head trauma.  Multiple syncopal episode.  EXAM: CT HEAD WITHOUT CONTRAST  TECHNIQUE: Contiguous axial images were obtained from the base of the skull through the vertex without intravenous contrast.  RADIATION DOSE REDUCTION: This exam was performed according to the departmental dose-optimization program which includes automated exposure control, adjustment of the mA and/or kV according to patient size and/or use of iterative reconstruction technique.  COMPARISON:  CT head dated March 18, 2021.  FINDINGS: Brain: No evidence of acute infarction, hemorrhage, hydrocephalus, extra-axial collection or mass lesion/mass effect.  Vascular: No hyperdense vessel or unexpected calcification.  Skull: Normal. Negative for fracture or focal lesion.  Sinuses/Orbits: No acute finding.  Other: None.  IMPRESSION: No acute intracranial pathology.  Electronically Signed   By: Larose Hires D.O.   On: 01/11/2023 11:55 Note: Reviewed        Physical Exam  General appearance: Well nourished, well developed, and well hydrated. In no apparent acute distress Mental status: Alert, oriented x 3 (person, place, & time)       Respiratory: No evidence of acute respiratory distress Eyes: PERLA Vitals: Temp (!) 97.3  F (36.3 C)   Resp 18  BMI: Estimated body mass index is 29.15 kg/m as calculated from  the following:   Height as of 10/18/22: 5\' 11"  (1.803 m).   Weight as of 10/18/22: 209 lb (94.8 kg). Ideal: Patient weight not recorded  Assessment   Diagnosis Status  1. Degenerative disc disease, lumbar   2. Lumbar radiculopathy   3. Chronic pain syndrome    Controlled Controlled Controlled   Updated Problems: No problems updated.  Plan of Care  Continue with dieting, healthy nutrition and exercise.  Follow-up as needed.  Follow-up plan:   No follow-ups on file.      Right T1-T2 ESI 07/21/20, 09/01/21; RIGHT GONB 09/01/21           Recent Visits Date Type Provider Dept  08/24/23 Procedure visit John Jolly, MD [facility]  Showing recent visits within past 90 days and meeting all other requirements Today's Visits Date Type Provider Dept  09/12/23 Office Visit John Jolly, MD [facility]  Showing today's visits and meeting all other requirements Future Appointments No visits were found meeting these conditions. Showing future appointments within next 90 days and meeting all other requirements  I discussed the assessment and treatment plan with the patient. The patient was provided an opportunity to ask questions and all were answered. The patient agreed with the plan and demonstrated an understanding of the instructions.  Patient advised to call back or seek an in-person evaluation if the symptoms or condition worsens.  Duration of encounter: .  Total time on encounter, as per AMA guidelines included both the face-to-face and non-face-to-face time personally spent by the physician and/or other qualified health care professional(s) on the day of the encounter (includes time in activities that require the physician or other qualified health care professional and does not include time in activities normally performed by clinical staff). Physician's time may include the following activities when performed: Preparing to see the patient (e.g.,  pre-charting review of records, searching for previously ordered imaging, lab work, and nerve conduction tests) Review of prior analgesic pharmacotherapies. Reviewing PMP Interpreting ordered tests (e.g., lab work, imaging, nerve conduction tests) Performing post-procedure evaluations, including interpretation of diagnostic procedures Obtaining and/or reviewing separately obtained history Performing a medically appropriate examination and/or evaluation Counseling and educating the patient/family/caregiver Ordering medications, tests, or procedures Referring and communicating with other health care professionals (when not separately reported) Documenting clinical information in the electronic or other health record Independently interpreting results (not separately reported) and communicating results to the patient/ family/caregiver Care coordination (not separately reported)  Note by: John Jolly, MD Date: 09/12/2023; Time: 3:38 PM

## 2023-12-20 ENCOUNTER — Encounter: Payer: Self-pay | Admitting: Student in an Organized Health Care Education/Training Program

## 2023-12-25 ENCOUNTER — Other Ambulatory Visit: Payer: Self-pay | Admitting: *Deleted

## 2023-12-25 MED ORDER — TIZANIDINE HCL 4 MG PO TABS: 4.0000 mg | ORAL_TABLET | Freq: Three times a day (TID) | ORAL | 1 refills | Status: AC | PRN

## 2023-12-25 NOTE — Telephone Encounter (Signed)
Patient informed. 

## 2024-03-21 ENCOUNTER — Encounter: Payer: Self-pay | Admitting: Student in an Organized Health Care Education/Training Program
